# Patient Record
Sex: Male | Born: 1935 | Race: Black or African American | Hispanic: No | Marital: Married | Smoking: Never smoker
Health system: Southern US, Community
[De-identification: ages and names within clinical notes are randomized; demographics above are authoritative.]

## PROBLEM LIST (undated history)

## (undated) DIAGNOSIS — N186 End stage renal disease: Secondary | ICD-10-CM

## (undated) DIAGNOSIS — R569 Unspecified convulsions: Secondary | ICD-10-CM

## (undated) DIAGNOSIS — M1990 Unspecified osteoarthritis, unspecified site: Secondary | ICD-10-CM

## (undated) DIAGNOSIS — I10 Essential (primary) hypertension: Secondary | ICD-10-CM

## (undated) DIAGNOSIS — G4730 Sleep apnea, unspecified: Secondary | ICD-10-CM

## (undated) DIAGNOSIS — E119 Type 2 diabetes mellitus without complications: Secondary | ICD-10-CM

## (undated) DIAGNOSIS — Z992 Dependence on renal dialysis: Secondary | ICD-10-CM

## (undated) DIAGNOSIS — K922 Gastrointestinal hemorrhage, unspecified: Secondary | ICD-10-CM

## (undated) DIAGNOSIS — I272 Pulmonary hypertension, unspecified: Secondary | ICD-10-CM

## (undated) DIAGNOSIS — S065X9A Traumatic subdural hemorrhage with loss of consciousness of unspecified duration, initial encounter: Secondary | ICD-10-CM

## (undated) DIAGNOSIS — Z9289 Personal history of other medical treatment: Secondary | ICD-10-CM

## (undated) HISTORY — DX: Traumatic subdural hemorrhage with loss of consciousness of unspecified duration, initial encounter: S06.5X9A

---

## 1998-02-27 ENCOUNTER — Inpatient Hospital Stay (HOSPITAL_COMMUNITY): Admission: EM | Admit: 1998-02-27 | Discharge: 1998-04-02 | Payer: Self-pay | Admitting: Internal Medicine

## 1998-04-02 ENCOUNTER — Inpatient Hospital Stay
Admission: RE | Admit: 1998-04-02 | Discharge: 1998-05-07 | Payer: Self-pay | Admitting: Physical Medicine & Rehabilitation

## 1998-04-28 ENCOUNTER — Encounter: Payer: Self-pay | Admitting: Physical Medicine & Rehabilitation

## 1998-05-07 ENCOUNTER — Inpatient Hospital Stay (HOSPITAL_COMMUNITY)
Admission: RE | Admit: 1998-05-07 | Discharge: 1998-05-27 | Payer: Self-pay | Admitting: Physical Medicine & Rehabilitation

## 1998-05-29 ENCOUNTER — Encounter
Admission: RE | Admit: 1998-05-29 | Discharge: 1998-08-27 | Payer: Self-pay | Admitting: Physical Medicine and Rehabilitation

## 1998-06-18 ENCOUNTER — Encounter: Payer: Self-pay | Admitting: Neurosurgery

## 1998-06-18 ENCOUNTER — Ambulatory Visit (HOSPITAL_COMMUNITY): Admission: RE | Admit: 1998-06-18 | Discharge: 1998-06-18 | Payer: Self-pay | Admitting: Neurosurgery

## 1998-06-19 ENCOUNTER — Inpatient Hospital Stay (HOSPITAL_COMMUNITY): Admission: AD | Admit: 1998-06-19 | Discharge: 1998-06-26 | Payer: Self-pay | Admitting: Infectious Diseases

## 1998-06-19 ENCOUNTER — Encounter: Admission: RE | Admit: 1998-06-19 | Discharge: 1998-06-19 | Payer: Self-pay | Admitting: Internal Medicine

## 1998-07-28 ENCOUNTER — Encounter: Admission: RE | Admit: 1998-07-28 | Discharge: 1998-07-28 | Payer: Self-pay | Admitting: Internal Medicine

## 1998-08-12 ENCOUNTER — Encounter: Payer: Self-pay | Admitting: Internal Medicine

## 1998-08-12 ENCOUNTER — Ambulatory Visit (HOSPITAL_COMMUNITY): Admission: RE | Admit: 1998-08-12 | Discharge: 1998-08-12 | Payer: Self-pay | Admitting: Internal Medicine

## 1998-08-18 ENCOUNTER — Encounter: Admission: RE | Admit: 1998-08-18 | Discharge: 1998-08-18 | Payer: Self-pay | Admitting: Infectious Diseases

## 1998-09-22 ENCOUNTER — Encounter: Admission: RE | Admit: 1998-09-22 | Discharge: 1998-09-22 | Payer: Self-pay | Admitting: Internal Medicine

## 1998-10-08 ENCOUNTER — Ambulatory Visit (HOSPITAL_COMMUNITY): Admission: RE | Admit: 1998-10-08 | Discharge: 1998-10-08 | Payer: Self-pay | Admitting: Neurosurgery

## 1998-10-08 ENCOUNTER — Encounter: Payer: Self-pay | Admitting: Neurosurgery

## 1998-11-24 ENCOUNTER — Encounter: Admission: RE | Admit: 1998-11-24 | Discharge: 1998-11-24 | Payer: Self-pay | Admitting: Internal Medicine

## 1998-12-10 ENCOUNTER — Ambulatory Visit (HOSPITAL_COMMUNITY): Admission: RE | Admit: 1998-12-10 | Discharge: 1998-12-10 | Payer: Self-pay | Admitting: Neurosurgery

## 1998-12-22 ENCOUNTER — Encounter: Admission: RE | Admit: 1998-12-22 | Discharge: 1998-12-22 | Payer: Self-pay | Admitting: Internal Medicine

## 1999-01-19 ENCOUNTER — Encounter: Admission: RE | Admit: 1999-01-19 | Discharge: 1999-01-19 | Payer: Self-pay | Admitting: Internal Medicine

## 2000-01-11 ENCOUNTER — Encounter: Admission: RE | Admit: 2000-01-11 | Discharge: 2000-01-11 | Payer: Self-pay | Admitting: Family Medicine

## 2000-01-11 ENCOUNTER — Encounter: Payer: Self-pay | Admitting: Family Medicine

## 2000-01-28 ENCOUNTER — Ambulatory Visit (HOSPITAL_COMMUNITY): Admission: RE | Admit: 2000-01-28 | Discharge: 2000-01-28 | Payer: Self-pay | Admitting: *Deleted

## 2000-02-02 ENCOUNTER — Ambulatory Visit (HOSPITAL_COMMUNITY): Admission: RE | Admit: 2000-02-02 | Discharge: 2000-02-02 | Payer: Self-pay | Admitting: *Deleted

## 2000-02-02 ENCOUNTER — Encounter: Payer: Self-pay | Admitting: *Deleted

## 2000-02-12 ENCOUNTER — Ambulatory Visit (HOSPITAL_COMMUNITY): Admission: RE | Admit: 2000-02-12 | Discharge: 2000-02-12 | Payer: Self-pay | Admitting: Family Medicine

## 2000-04-06 ENCOUNTER — Observation Stay (HOSPITAL_COMMUNITY): Admission: RE | Admit: 2000-04-06 | Discharge: 2000-04-07 | Payer: Self-pay | Admitting: Nephrology

## 2000-04-06 ENCOUNTER — Encounter (INDEPENDENT_AMBULATORY_CARE_PROVIDER_SITE_OTHER): Payer: Self-pay | Admitting: *Deleted

## 2000-04-06 ENCOUNTER — Encounter: Payer: Self-pay | Admitting: Nephrology

## 2000-04-16 ENCOUNTER — Emergency Department (HOSPITAL_COMMUNITY): Admission: EM | Admit: 2000-04-16 | Discharge: 2000-04-16 | Payer: Self-pay | Admitting: Emergency Medicine

## 2000-04-17 ENCOUNTER — Inpatient Hospital Stay (HOSPITAL_COMMUNITY): Admission: EM | Admit: 2000-04-17 | Discharge: 2000-04-20 | Payer: Self-pay | Admitting: Emergency Medicine

## 2000-04-19 ENCOUNTER — Encounter: Payer: Self-pay | Admitting: Nephrology

## 2000-12-26 ENCOUNTER — Ambulatory Visit (HOSPITAL_COMMUNITY): Admission: RE | Admit: 2000-12-26 | Discharge: 2000-12-26 | Payer: Self-pay | Admitting: Otolaryngology

## 2000-12-26 ENCOUNTER — Encounter: Payer: Self-pay | Admitting: Otolaryngology

## 2001-11-06 ENCOUNTER — Inpatient Hospital Stay (HOSPITAL_COMMUNITY): Admission: AD | Admit: 2001-11-06 | Discharge: 2001-11-09 | Payer: Self-pay | Admitting: Family Medicine

## 2001-11-07 ENCOUNTER — Encounter: Payer: Self-pay | Admitting: Infectious Diseases

## 2001-11-07 ENCOUNTER — Encounter: Payer: Self-pay | Admitting: Family Medicine

## 2002-09-06 HISTORY — PX: SPINE SURGERY: SHX786

## 2004-09-28 ENCOUNTER — Encounter (HOSPITAL_COMMUNITY): Admission: RE | Admit: 2004-09-28 | Discharge: 2004-12-27 | Payer: Self-pay | Admitting: Nephrology

## 2005-01-04 ENCOUNTER — Encounter (HOSPITAL_COMMUNITY): Admission: RE | Admit: 2005-01-04 | Discharge: 2005-04-04 | Payer: Self-pay | Admitting: Nephrology

## 2005-04-12 ENCOUNTER — Encounter (HOSPITAL_COMMUNITY): Admission: RE | Admit: 2005-04-12 | Discharge: 2005-07-11 | Payer: Self-pay | Admitting: Nephrology

## 2005-07-19 ENCOUNTER — Encounter (HOSPITAL_COMMUNITY): Admission: RE | Admit: 2005-07-19 | Discharge: 2005-10-17 | Payer: Self-pay | Admitting: Nephrology

## 2005-10-25 ENCOUNTER — Encounter (HOSPITAL_COMMUNITY): Admission: RE | Admit: 2005-10-25 | Discharge: 2006-01-23 | Payer: Self-pay | Admitting: Nephrology

## 2006-01-05 ENCOUNTER — Encounter: Payer: Self-pay | Admitting: Neurosurgery

## 2006-01-05 ENCOUNTER — Encounter: Payer: Self-pay | Admitting: Infectious Diseases

## 2006-02-01 ENCOUNTER — Encounter (HOSPITAL_COMMUNITY): Admission: RE | Admit: 2006-02-01 | Discharge: 2006-05-02 | Payer: Self-pay | Admitting: Nephrology

## 2006-05-10 ENCOUNTER — Encounter (HOSPITAL_COMMUNITY): Admission: RE | Admit: 2006-05-10 | Discharge: 2006-08-08 | Payer: Self-pay | Admitting: Nephrology

## 2006-07-21 ENCOUNTER — Inpatient Hospital Stay (HOSPITAL_COMMUNITY): Admission: EM | Admit: 2006-07-21 | Discharge: 2006-07-24 | Payer: Self-pay | Admitting: Emergency Medicine

## 2006-08-03 ENCOUNTER — Ambulatory Visit: Payer: Self-pay | Admitting: Internal Medicine

## 2006-08-10 ENCOUNTER — Ambulatory Visit (HOSPITAL_COMMUNITY): Admission: RE | Admit: 2006-08-10 | Discharge: 2006-08-10 | Payer: Self-pay | Admitting: Gastroenterology

## 2007-03-03 ENCOUNTER — Encounter (HOSPITAL_COMMUNITY): Admission: RE | Admit: 2007-03-03 | Discharge: 2007-05-10 | Payer: Self-pay | Admitting: Family Medicine

## 2007-03-08 ENCOUNTER — Encounter: Admission: RE | Admit: 2007-03-08 | Discharge: 2007-03-08 | Payer: Self-pay | Admitting: Family Medicine

## 2007-06-20 ENCOUNTER — Encounter (HOSPITAL_COMMUNITY): Admission: RE | Admit: 2007-06-20 | Discharge: 2007-09-18 | Payer: Self-pay | Admitting: Nephrology

## 2007-08-17 ENCOUNTER — Inpatient Hospital Stay (HOSPITAL_COMMUNITY): Admission: AD | Admit: 2007-08-17 | Discharge: 2007-08-21 | Payer: Self-pay | Admitting: Nephrology

## 2007-08-18 ENCOUNTER — Encounter (INDEPENDENT_AMBULATORY_CARE_PROVIDER_SITE_OTHER): Payer: Self-pay | Admitting: Gastroenterology

## 2007-09-26 ENCOUNTER — Encounter (HOSPITAL_COMMUNITY): Admission: RE | Admit: 2007-09-26 | Discharge: 2007-12-25 | Payer: Self-pay | Admitting: Cardiology

## 2008-01-02 ENCOUNTER — Encounter (HOSPITAL_COMMUNITY): Admission: RE | Admit: 2008-01-02 | Discharge: 2008-04-01 | Payer: Self-pay | Admitting: Nephrology

## 2008-04-09 ENCOUNTER — Encounter (HOSPITAL_COMMUNITY): Admission: RE | Admit: 2008-04-09 | Discharge: 2008-07-08 | Payer: Self-pay | Admitting: Nephrology

## 2008-07-16 ENCOUNTER — Encounter (HOSPITAL_COMMUNITY): Admission: RE | Admit: 2008-07-16 | Discharge: 2008-09-05 | Payer: Self-pay | Admitting: Nephrology

## 2008-09-10 ENCOUNTER — Encounter (HOSPITAL_COMMUNITY): Admission: RE | Admit: 2008-09-10 | Discharge: 2008-12-09 | Payer: Self-pay | Admitting: Nephrology

## 2008-12-17 ENCOUNTER — Encounter (HOSPITAL_COMMUNITY): Admission: RE | Admit: 2008-12-17 | Discharge: 2009-03-17 | Payer: Self-pay | Admitting: Nephrology

## 2009-03-25 ENCOUNTER — Encounter (HOSPITAL_COMMUNITY): Admission: RE | Admit: 2009-03-25 | Discharge: 2009-06-23 | Payer: Self-pay | Admitting: Nephrology

## 2009-07-01 ENCOUNTER — Encounter (HOSPITAL_COMMUNITY): Admission: RE | Admit: 2009-07-01 | Discharge: 2009-09-29 | Payer: Self-pay | Admitting: Nephrology

## 2009-09-30 ENCOUNTER — Encounter (HOSPITAL_COMMUNITY): Admission: RE | Admit: 2009-09-30 | Discharge: 2009-12-29 | Payer: Self-pay | Admitting: Nephrology

## 2009-12-30 ENCOUNTER — Encounter (HOSPITAL_COMMUNITY): Admission: RE | Admit: 2009-12-30 | Discharge: 2010-03-30 | Payer: Self-pay | Admitting: Nephrology

## 2010-03-31 ENCOUNTER — Encounter (HOSPITAL_COMMUNITY): Admission: RE | Admit: 2010-03-31 | Discharge: 2010-06-29 | Payer: Self-pay | Admitting: Nephrology

## 2010-04-16 ENCOUNTER — Encounter: Admission: RE | Admit: 2010-04-16 | Discharge: 2010-04-16 | Payer: Self-pay | Admitting: Nephrology

## 2010-06-30 ENCOUNTER — Encounter (HOSPITAL_COMMUNITY)
Admission: RE | Admit: 2010-06-30 | Discharge: 2010-09-28 | Payer: Self-pay | Source: Home / Self Care | Attending: Nephrology | Admitting: Nephrology

## 2010-09-21 LAB — POCT HEMOGLOBIN-HEMACUE: Hemoglobin: 12.9 g/dL — ABNORMAL LOW (ref 13.0–17.0)

## 2010-09-27 ENCOUNTER — Encounter: Payer: Self-pay | Admitting: Family Medicine

## 2010-09-28 ENCOUNTER — Encounter (HOSPITAL_COMMUNITY)
Admission: RE | Admit: 2010-09-28 | Discharge: 2010-10-06 | Payer: Self-pay | Source: Home / Self Care | Attending: Nephrology | Admitting: Nephrology

## 2010-09-29 LAB — RENAL FUNCTION PANEL
Albumin: 4.3 g/dL (ref 3.5–5.2)
BUN: 98 mg/dL — ABNORMAL HIGH (ref 6–23)
CO2: 18 mEq/L — ABNORMAL LOW (ref 19–32)
Calcium: 8.1 mg/dL — ABNORMAL LOW (ref 8.4–10.5)
Chloride: 113 mEq/L — ABNORMAL HIGH (ref 96–112)
Creatinine, Ser: 5.38 mg/dL — ABNORMAL HIGH (ref 0.4–1.5)
GFR calc Af Amer: 13 mL/min — ABNORMAL LOW (ref >60)
GFR calc non Af Amer: 10 mL/min — ABNORMAL LOW (ref >60)
Glucose, Bld: 148 mg/dL — ABNORMAL HIGH (ref 70–99)
Phosphorus: 5.7 mg/dL — ABNORMAL HIGH (ref 2.3–4.6)
Potassium: 5.5 mEq/L — ABNORMAL HIGH (ref 3.5–5.1)
Sodium: 140 mEq/L (ref 135–145)

## 2010-09-29 LAB — CBC
HCT: 40.3 % (ref 39.0–52.0)
Hemoglobin: 12.7 g/dL — ABNORMAL LOW (ref 13.0–17.0)
MCH: 29.1 pg (ref 26.0–34.0)
MCHC: 31.5 g/dL (ref 30.0–36.0)
MCV: 92.4 fL (ref 78.0–100.0)
Platelets: 131 10*3/uL — ABNORMAL LOW (ref 150–400)
RBC: 4.36 MIL/uL (ref 4.22–5.81)
RDW: 16.2 % — ABNORMAL HIGH (ref 11.5–15.5)
WBC: 3.8 10*3/uL — ABNORMAL LOW (ref 4.0–10.5)

## 2010-10-12 ENCOUNTER — Encounter (HOSPITAL_COMMUNITY): Payer: Self-pay

## 2010-10-16 ENCOUNTER — Other Ambulatory Visit: Payer: Self-pay

## 2010-10-16 ENCOUNTER — Encounter (HOSPITAL_COMMUNITY)
Admission: RE | Admit: 2010-10-16 | Discharge: 2010-10-16 | Disposition: A | Discharge status: Home / Self Care | Payer: MEDICARE | Source: Ambulatory Visit | Attending: Nephrology | Admitting: Nephrology

## 2010-10-16 DIAGNOSIS — N189 Chronic kidney disease, unspecified: Secondary | ICD-10-CM | POA: Insufficient documentation

## 2010-10-16 DIAGNOSIS — I129 Hypertensive chronic kidney disease with stage 1 through stage 4 chronic kidney disease, or unspecified chronic kidney disease: Secondary | ICD-10-CM | POA: Insufficient documentation

## 2010-10-16 DIAGNOSIS — E119 Type 2 diabetes mellitus without complications: Secondary | ICD-10-CM | POA: Insufficient documentation

## 2010-10-16 DIAGNOSIS — D631 Anemia in chronic kidney disease: Secondary | ICD-10-CM | POA: Insufficient documentation

## 2010-10-16 LAB — POCT HEMOGLOBIN-HEMACUE: Hemoglobin: 11.3 g/dL — ABNORMAL LOW (ref 13.0–17.0)

## 2010-10-23 ENCOUNTER — Encounter (HOSPITAL_COMMUNITY): Payer: MEDICARE

## 2010-10-23 ENCOUNTER — Other Ambulatory Visit: Payer: Self-pay

## 2010-10-26 LAB — POCT HEMOGLOBIN-HEMACUE: Hemoglobin: 11 g/dL — ABNORMAL LOW (ref 13.0–17.0)

## 2010-10-30 ENCOUNTER — Other Ambulatory Visit: Payer: Self-pay | Admitting: Nephrology

## 2010-10-30 ENCOUNTER — Encounter (HOSPITAL_COMMUNITY): Payer: MEDICARE

## 2010-10-30 LAB — RENAL FUNCTION PANEL
Albumin: 4.2 g/dL (ref 3.5–5.2)
BUN: 84 mg/dL — ABNORMAL HIGH (ref 6–23)
CO2: 18 mEq/L — ABNORMAL LOW (ref 19–32)
Calcium: 8.4 mg/dL (ref 8.4–10.5)
Chloride: 111 mEq/L (ref 96–112)
Creatinine, Ser: 5.58 mg/dL — ABNORMAL HIGH (ref 0.4–1.5)
GFR calc Af Amer: 12 mL/min — ABNORMAL LOW (ref >60)
GFR calc non Af Amer: 10 mL/min — ABNORMAL LOW (ref >60)
Glucose, Bld: 124 mg/dL — ABNORMAL HIGH (ref 70–99)
Phosphorus: 5.4 mg/dL — ABNORMAL HIGH (ref 2.3–4.6)
Potassium: 5 mEq/L (ref 3.5–5.1)
Sodium: 139 mEq/L (ref 135–145)

## 2010-10-30 LAB — CBC
HCT: 37.9 % — ABNORMAL LOW (ref 39.0–52.0)
Hemoglobin: 12.1 g/dL — ABNORMAL LOW (ref 13.0–17.0)
MCH: 30.1 pg (ref 26.0–34.0)
MCHC: 31.9 g/dL (ref 30.0–36.0)
MCV: 94.3 fL (ref 78.0–100.0)
Platelets: 137 10*3/uL — ABNORMAL LOW (ref 150–400)
RBC: 4.02 MIL/uL — ABNORMAL LOW (ref 4.22–5.81)
RDW: 16.6 % — ABNORMAL HIGH (ref 11.5–15.5)
WBC: 3.2 10*3/uL — ABNORMAL LOW (ref 4.0–10.5)

## 2010-11-13 ENCOUNTER — Other Ambulatory Visit: Payer: Self-pay

## 2010-11-13 ENCOUNTER — Encounter (HOSPITAL_COMMUNITY): Payer: MEDICARE | Attending: Nephrology

## 2010-11-13 DIAGNOSIS — D631 Anemia in chronic kidney disease: Secondary | ICD-10-CM | POA: Insufficient documentation

## 2010-11-13 DIAGNOSIS — I129 Hypertensive chronic kidney disease with stage 1 through stage 4 chronic kidney disease, or unspecified chronic kidney disease: Secondary | ICD-10-CM | POA: Insufficient documentation

## 2010-11-13 DIAGNOSIS — E119 Type 2 diabetes mellitus without complications: Secondary | ICD-10-CM | POA: Insufficient documentation

## 2010-11-13 DIAGNOSIS — N189 Chronic kidney disease, unspecified: Secondary | ICD-10-CM | POA: Insufficient documentation

## 2010-11-16 LAB — RENAL FUNCTION PANEL
Albumin: 4.2 g/dL (ref 3.5–5.2)
BUN: 89 mg/dL — ABNORMAL HIGH (ref 6–23)
CO2: 19 meq/L (ref 19–32)
Calcium: 8.1 mg/dL — ABNORMAL LOW (ref 8.4–10.5)
Chloride: 111 meq/L (ref 96–112)
Creatinine, Ser: 5.53 mg/dL — ABNORMAL HIGH (ref 0.4–1.5)
GFR calc non Af Amer: 10 mL/min — ABNORMAL LOW
Glucose, Bld: 198 mg/dL — ABNORMAL HIGH (ref 70–99)
Phosphorus: 4.8 mg/dL — ABNORMAL HIGH (ref 2.3–4.6)
Potassium: 5.7 meq/L — ABNORMAL HIGH (ref 3.5–5.1)
Sodium: 136 meq/L (ref 135–145)

## 2010-11-16 LAB — POCT HEMOGLOBIN-HEMACUE
Hemoglobin: 12.7 g/dL — ABNORMAL LOW (ref 13.0–17.0)
Hemoglobin: 11.7 g/dL — ABNORMAL LOW (ref 13.0–17.0)
Hemoglobin: 11.8 g/dL — ABNORMAL LOW (ref 13.0–17.0)

## 2010-11-16 LAB — CBC
HCT: 34.4 % — ABNORMAL LOW (ref 39.0–52.0)
Hemoglobin: 11.2 g/dL — ABNORMAL LOW (ref 13.0–17.0)
MCH: 29.8 pg (ref 26.0–34.0)
MCHC: 32.6 g/dL (ref 30.0–36.0)
MCV: 91.5 fL (ref 78.0–100.0)
Platelets: 134 10*3/uL — ABNORMAL LOW (ref 150–400)
RBC: 3.76 MIL/uL — ABNORMAL LOW (ref 4.22–5.81)
RDW: 14.8 % (ref 11.5–15.5)
WBC: 3.3 10*3/uL — ABNORMAL LOW (ref 4.0–10.5)

## 2010-11-17 LAB — CBC
HCT: 37.9 % — ABNORMAL LOW (ref 39.0–52.0)
Hemoglobin: 12 g/dL — ABNORMAL LOW (ref 13.0–17.0)
MCH: 29.8 pg (ref 26.0–34.0)
MCHC: 31.7 g/dL (ref 30.0–36.0)
MCV: 94 fL (ref 78.0–100.0)
Platelets: 124 10*3/uL — ABNORMAL LOW (ref 150–400)
RBC: 4.03 MIL/uL — ABNORMAL LOW (ref 4.22–5.81)
RDW: 15.9 % — ABNORMAL HIGH (ref 11.5–15.5)
WBC: 3.2 10*3/uL — ABNORMAL LOW (ref 4.0–10.5)

## 2010-11-17 LAB — RENAL FUNCTION PANEL
Albumin: 4.5 g/dL (ref 3.5–5.2)
BUN: 99 mg/dL — ABNORMAL HIGH (ref 6–23)
CO2: 20 mEq/L (ref 19–32)
Calcium: 8.3 mg/dL — ABNORMAL LOW (ref 8.4–10.5)
Chloride: 113 mEq/L — ABNORMAL HIGH (ref 96–112)
Creatinine, Ser: 5.58 mg/dL — ABNORMAL HIGH (ref 0.4–1.5)
GFR calc Af Amer: 12 mL/min — ABNORMAL LOW (ref >60)
GFR calc non Af Amer: 10 mL/min — ABNORMAL LOW (ref >60)
Glucose, Bld: 215 mg/dL — ABNORMAL HIGH (ref 70–99)
Phosphorus: 5.7 mg/dL — ABNORMAL HIGH (ref 2.3–4.6)
Potassium: 5.9 mEq/L — ABNORMAL HIGH (ref 3.5–5.1)
Sodium: 138 mEq/L (ref 135–145)

## 2010-11-17 LAB — POCT HEMOGLOBIN-HEMACUE: Hemoglobin: 12.9 g/dL — ABNORMAL LOW (ref 13.0–17.0)

## 2010-11-18 LAB — RENAL FUNCTION PANEL
CO2: 18 mEq/L — ABNORMAL LOW (ref 19–32)
Chloride: 114 mEq/L — ABNORMAL HIGH (ref 96–112)
Creatinine, Ser: 6.17 mg/dL — ABNORMAL HIGH (ref 0.4–1.5)
GFR calc Af Amer: 11 mL/min — ABNORMAL LOW (ref >60)
GFR calc non Af Amer: 9 mL/min — ABNORMAL LOW (ref >60)
Potassium: 5.3 mEq/L — ABNORMAL HIGH (ref 3.5–5.1)
Sodium: 137 mEq/L (ref 135–145)

## 2010-11-18 LAB — POCT HEMOGLOBIN-HEMACUE
Hemoglobin: 10.8 g/dL — ABNORMAL LOW (ref 13.0–17.0)
Hemoglobin: 10.8 g/dL — ABNORMAL LOW (ref 13.0–17.0)

## 2010-11-19 LAB — CBC
Platelets: 103 10*3/uL — ABNORMAL LOW (ref 150–400)
Platelets: 152 10*3/uL (ref 150–400)
RBC: 3.52 MIL/uL — ABNORMAL LOW (ref 4.22–5.81)
RBC: 3.94 MIL/uL — ABNORMAL LOW (ref 4.22–5.81)
RDW: 14.8 % (ref 11.5–15.5)
WBC: 3.7 10*3/uL — ABNORMAL LOW (ref 4.0–10.5)
WBC: 3.4 10*3/uL — ABNORMAL LOW (ref 4.0–10.5)

## 2010-11-19 LAB — POCT HEMOGLOBIN-HEMACUE
Hemoglobin: 12 g/dL — ABNORMAL LOW (ref 13.0–17.0)
Hemoglobin: 12.3 g/dL — ABNORMAL LOW (ref 13.0–17.0)
Hemoglobin: 13.8 g/dL (ref 13.0–17.0)

## 2010-11-20 LAB — RENAL FUNCTION PANEL
Calcium: 8.5 mg/dL (ref 8.4–10.5)
Creatinine, Ser: 5.45 mg/dL — ABNORMAL HIGH (ref 0.4–1.5)
GFR calc Af Amer: 12 mL/min — ABNORMAL LOW (ref >60)
GFR calc non Af Amer: 10 mL/min — ABNORMAL LOW (ref >60)
Phosphorus: 5.2 mg/dL — ABNORMAL HIGH (ref 2.3–4.6)
Sodium: 138 mEq/L (ref 135–145)

## 2010-11-20 LAB — POCT HEMOGLOBIN-HEMACUE: Hemoglobin: 11.8 g/dL — ABNORMAL LOW (ref 13.0–17.0)

## 2010-11-21 LAB — CBC
HCT: 28.2 % — ABNORMAL LOW (ref 39.0–52.0)
HCT: 36.5 % — ABNORMAL LOW (ref 39.0–52.0)
Hemoglobin: 11.8 g/dL — ABNORMAL LOW (ref 13.0–17.0)
MCH: 33.6 pg (ref 26.0–34.0)
MCHC: 33.6 g/dL (ref 30.0–36.0)
MCHC: 32.4 g/dL (ref 30.0–36.0)
MCV: 84.4 fL (ref 78.0–100.0)
RBC: 4.32 MIL/uL (ref 4.22–5.81)
RDW: 13.5 % (ref 11.5–15.5)
RDW: 20.1 % — ABNORMAL HIGH (ref 11.5–15.5)

## 2010-11-21 LAB — RENAL FUNCTION PANEL
BUN: 80 mg/dL — ABNORMAL HIGH (ref 6–23)
CO2: 20 mEq/L (ref 19–32)
CO2: 21 mEq/L (ref 19–32)
Calcium: 8.6 mg/dL (ref 8.4–10.5)
Calcium: 7.7 mg/dL — ABNORMAL LOW (ref 8.4–10.5)
Creatinine, Ser: 5.19 mg/dL — ABNORMAL HIGH (ref 0.4–1.5)
Creatinine, Ser: 4.78 mg/dL — ABNORMAL HIGH (ref 0.4–1.5)
GFR calc Af Amer: 13 mL/min — ABNORMAL LOW (ref >60)
Glucose, Bld: 220 mg/dL — ABNORMAL HIGH (ref 70–99)
Glucose, Bld: 209 mg/dL — ABNORMAL HIGH (ref 70–99)
Phosphorus: 3.9 mg/dL (ref 2.3–4.6)
Phosphorus: 5.5 mg/dL — ABNORMAL HIGH (ref 2.3–4.6)
Sodium: 135 mEq/L (ref 135–145)
Sodium: 138 mEq/L (ref 135–145)

## 2010-11-21 LAB — HEMOGLOBIN: Hemoglobin: 9.7 g/dL — ABNORMAL LOW (ref 13.0–17.0)

## 2010-11-22 LAB — POCT HEMOGLOBIN-HEMACUE: Hemoglobin: 11.5 g/dL — ABNORMAL LOW (ref 13.0–17.0)

## 2010-11-23 LAB — RENAL FUNCTION PANEL
CO2: 24 mEq/L (ref 19–32)
Calcium: 8.9 mg/dL (ref 8.4–10.5)
Chloride: 107 mEq/L (ref 96–112)
Creatinine, Ser: 4.67 mg/dL — ABNORMAL HIGH (ref 0.4–1.5)
GFR calc Af Amer: 15 mL/min — ABNORMAL LOW (ref >60)
GFR calc non Af Amer: 12 mL/min — ABNORMAL LOW (ref >60)
Glucose, Bld: 142 mg/dL — ABNORMAL HIGH (ref 70–99)

## 2010-11-23 LAB — CBC
HCT: 39.2 % (ref 39.0–52.0)
Hemoglobin: 13.1 g/dL (ref 13.0–17.0)
MCHC: 33.5 g/dL (ref 30.0–36.0)
MCV: 100.6 fL — ABNORMAL HIGH (ref 78.0–100.0)
RBC: 3.9 MIL/uL — ABNORMAL LOW (ref 4.22–5.81)
WBC: 3.9 10*3/uL — ABNORMAL LOW (ref 4.0–10.5)

## 2010-11-24 LAB — RENAL FUNCTION PANEL
BUN: 66 mg/dL — ABNORMAL HIGH (ref 6–23)
CO2: 26 mEq/L (ref 19–32)
Chloride: 104 mEq/L (ref 96–112)
Creatinine, Ser: 5.09 mg/dL — ABNORMAL HIGH (ref 0.4–1.5)
GFR calc Af Amer: 13 mL/min — ABNORMAL LOW (ref >60)
GFR calc non Af Amer: 11 mL/min — ABNORMAL LOW (ref >60)

## 2010-11-24 LAB — CBC
HCT: 34.4 % — ABNORMAL LOW (ref 39.0–52.0)
MCV: 97.7 fL (ref 78.0–100.0)
RBC: 3.52 MIL/uL — ABNORMAL LOW (ref 4.22–5.81)
WBC: 3.5 10*3/uL — ABNORMAL LOW (ref 4.0–10.5)

## 2010-11-25 LAB — RENAL FUNCTION PANEL
Albumin: 4.3 g/dL (ref 3.5–5.2)
Albumin: 4.3 g/dL (ref 3.5–5.2)
CO2: 18 mEq/L — ABNORMAL LOW (ref 19–32)
CO2: 21 mEq/L (ref 19–32)
Calcium: 8.4 mg/dL (ref 8.4–10.5)
Chloride: 112 mEq/L (ref 96–112)
GFR calc Af Amer: 15 mL/min — ABNORMAL LOW (ref >60)
GFR calc Af Amer: 14 mL/min — ABNORMAL LOW (ref >60)
GFR calc non Af Amer: 12 mL/min — ABNORMAL LOW (ref >60)
GFR calc non Af Amer: 11 mL/min — ABNORMAL LOW (ref >60)
Potassium: 5.2 mEq/L — ABNORMAL HIGH (ref 3.5–5.1)
Sodium: 137 mEq/L (ref 135–145)
Sodium: 140 mEq/L (ref 135–145)

## 2010-11-25 LAB — CBC
HCT: 35.6 % — ABNORMAL LOW (ref 39.0–52.0)
MCV: 92.2 fL (ref 78.0–100.0)
Platelets: 168 10*3/uL (ref 150–400)
RBC: 4.15 MIL/uL — ABNORMAL LOW (ref 4.22–5.81)
WBC: 3.9 10*3/uL — ABNORMAL LOW (ref 4.0–10.5)
WBC: 4.6 10*3/uL (ref 4.0–10.5)

## 2010-11-25 LAB — POCT HEMOGLOBIN-HEMACUE
Hemoglobin: 11.7 g/dL — ABNORMAL LOW (ref 13.0–17.0)
Hemoglobin: 11.7 g/dL — ABNORMAL LOW (ref 13.0–17.0)
Hemoglobin: 12.7 g/dL — ABNORMAL LOW (ref 13.0–17.0)

## 2010-11-27 ENCOUNTER — Other Ambulatory Visit: Payer: Self-pay | Admitting: Nephrology

## 2010-11-27 ENCOUNTER — Encounter (HOSPITAL_COMMUNITY): Payer: MEDICARE

## 2010-11-27 LAB — CBC
HCT: 37.4 % — ABNORMAL LOW (ref 39.0–52.0)
HCT: 41 % (ref 39.0–52.0)
Hemoglobin: 12.1 g/dL — ABNORMAL LOW (ref 13.0–17.0)
Hemoglobin: 13.1 g/dL (ref 13.0–17.0)
Platelets: 204 10*3/uL (ref 150–400)
RBC: 3.99 MIL/uL — ABNORMAL LOW (ref 4.22–5.81)
RDW: 25.2 % — ABNORMAL HIGH (ref 11.5–15.5)
WBC: 2.9 10*3/uL — ABNORMAL LOW (ref 4.0–10.5)
WBC: 4 10*3/uL (ref 4.0–10.5)

## 2010-11-27 LAB — RENAL FUNCTION PANEL
Albumin: 4.2 g/dL (ref 3.5–5.2)
BUN: 69 mg/dL — ABNORMAL HIGH (ref 6–23)
CO2: 21 mEq/L (ref 19–32)
Chloride: 109 mEq/L (ref 96–112)
GFR calc Af Amer: 12 mL/min — ABNORMAL LOW (ref >60)
GFR calc non Af Amer: 10 mL/min — ABNORMAL LOW (ref >60)
Glucose, Bld: 136 mg/dL — ABNORMAL HIGH (ref 70–99)
Phosphorus: 4.2 mg/dL (ref 2.3–4.6)
Potassium: 5 mEq/L (ref 3.5–5.1)
Potassium: 4.7 mEq/L (ref 3.5–5.1)
Sodium: 138 mEq/L (ref 135–145)
Sodium: 135 mEq/L (ref 135–145)

## 2010-11-27 LAB — POCT HEMOGLOBIN-HEMACUE
Hemoglobin: 11.8 g/dL — ABNORMAL LOW (ref 13.0–17.0)
Hemoglobin: 13 g/dL (ref 13.0–17.0)
Hemoglobin: 14 g/dL (ref 13.0–17.0)

## 2010-12-07 LAB — POCT HEMOGLOBIN-HEMACUE: Hemoglobin: 11.3 g/dL — ABNORMAL LOW (ref 13.0–17.0)

## 2010-12-08 LAB — RENAL FUNCTION PANEL
Albumin: 4.5 g/dL (ref 3.5–5.2)
Calcium: 8.3 mg/dL — ABNORMAL LOW (ref 8.4–10.5)
GFR calc Af Amer: 13 mL/min — ABNORMAL LOW (ref >60)
GFR calc non Af Amer: 11 mL/min — ABNORMAL LOW (ref >60)
Phosphorus: 5 mg/dL — ABNORMAL HIGH (ref 2.3–4.6)
Potassium: 5.1 mEq/L (ref 3.5–5.1)
Sodium: 138 mEq/L (ref 135–145)

## 2010-12-08 LAB — CBC
Hemoglobin: 11.8 g/dL — ABNORMAL LOW (ref 13.0–17.0)
MCHC: 32.4 g/dL (ref 30.0–36.0)
Platelets: 188 10*3/uL (ref 150–400)
RDW: 19.3 % — ABNORMAL HIGH (ref 11.5–15.5)

## 2010-12-08 LAB — POCT HEMOGLOBIN-HEMACUE: Hemoglobin: 11.1 g/dL — ABNORMAL LOW (ref 13.0–17.0)

## 2010-12-09 LAB — RENAL FUNCTION PANEL
Albumin: 4.4 g/dL (ref 3.5–5.2)
GFR calc Af Amer: 16 mL/min — ABNORMAL LOW (ref >60)
Phosphorus: 4.4 mg/dL (ref 2.3–4.6)
Potassium: 5.3 mEq/L — ABNORMAL HIGH (ref 3.5–5.1)
Sodium: 135 mEq/L (ref 135–145)

## 2010-12-09 LAB — CBC
Platelets: 169 10*3/uL (ref 150–400)
RDW: 19.2 % — ABNORMAL HIGH (ref 11.5–15.5)

## 2010-12-09 LAB — POCT HEMOGLOBIN-HEMACUE: Hemoglobin: 11.6 g/dL — ABNORMAL LOW (ref 13.0–17.0)

## 2010-12-10 LAB — POCT HEMOGLOBIN-HEMACUE
Hemoglobin: 11.8 g/dL — ABNORMAL LOW (ref 13.0–17.0)
Hemoglobin: 10.9 g/dL — ABNORMAL LOW (ref 13.0–17.0)

## 2010-12-11 ENCOUNTER — Other Ambulatory Visit: Payer: Self-pay | Admitting: Nephrology

## 2010-12-11 ENCOUNTER — Encounter (HOSPITAL_COMMUNITY): Payer: MEDICARE | Attending: Nephrology

## 2010-12-11 DIAGNOSIS — I129 Hypertensive chronic kidney disease with stage 1 through stage 4 chronic kidney disease, or unspecified chronic kidney disease: Secondary | ICD-10-CM | POA: Insufficient documentation

## 2010-12-11 DIAGNOSIS — E119 Type 2 diabetes mellitus without complications: Secondary | ICD-10-CM | POA: Insufficient documentation

## 2010-12-11 DIAGNOSIS — D631 Anemia in chronic kidney disease: Secondary | ICD-10-CM | POA: Insufficient documentation

## 2010-12-11 DIAGNOSIS — N189 Chronic kidney disease, unspecified: Secondary | ICD-10-CM | POA: Insufficient documentation

## 2010-12-11 LAB — RENAL FUNCTION PANEL
Albumin: 4.5 g/dL (ref 3.5–5.2)
BUN: 75 mg/dL — ABNORMAL HIGH (ref 6–23)
Calcium: 8.7 mg/dL (ref 8.4–10.5)
Phosphorus: 4.6 mg/dL (ref 2.3–4.6)
Potassium: 5.8 mEq/L — ABNORMAL HIGH (ref 3.5–5.1)
Sodium: 142 mEq/L (ref 135–145)

## 2010-12-11 LAB — POCT HEMOGLOBIN-HEMACUE: Hemoglobin: 10.9 g/dL — ABNORMAL LOW (ref 13.0–17.0)

## 2010-12-11 LAB — CBC
Platelets: 180 10*3/uL (ref 150–400)
RDW: 19.4 % — ABNORMAL HIGH (ref 11.5–15.5)

## 2010-12-12 LAB — RENAL FUNCTION PANEL
Albumin: 4.3 g/dL (ref 3.5–5.2)
BUN: 85 mg/dL — ABNORMAL HIGH (ref 6–23)
Calcium: 8.6 mg/dL (ref 8.4–10.5)
Phosphorus: 5.2 mg/dL — ABNORMAL HIGH (ref 2.3–4.6)
Potassium: 5.6 mEq/L — ABNORMAL HIGH (ref 3.5–5.1)

## 2010-12-12 LAB — CBC
HCT: 33.1 % — ABNORMAL LOW (ref 39.0–52.0)
MCHC: 32.3 g/dL (ref 30.0–36.0)
Platelets: 192 10*3/uL (ref 150–400)
RDW: 18.9 % — ABNORMAL HIGH (ref 11.5–15.5)

## 2010-12-12 LAB — POCT HEMOGLOBIN-HEMACUE: Hemoglobin: 12 g/dL — ABNORMAL LOW (ref 13.0–17.0)

## 2010-12-13 LAB — RENAL FUNCTION PANEL
Albumin: 4 g/dL (ref 3.5–5.2)
BUN: 69 mg/dL — ABNORMAL HIGH (ref 6–23)
Calcium: 8.9 mg/dL (ref 8.4–10.5)
Creatinine, Ser: 3.95 mg/dL — ABNORMAL HIGH (ref 0.4–1.5)
GFR calc Af Amer: 18 mL/min — ABNORMAL LOW (ref >60)
GFR calc non Af Amer: 15 mL/min — ABNORMAL LOW (ref >60)
Phosphorus: 4.5 mg/dL (ref 2.3–4.6)

## 2010-12-13 LAB — CBC
HCT: 29.8 % — ABNORMAL LOW (ref 39.0–52.0)
MCHC: 32.3 g/dL (ref 30.0–36.0)
MCV: 86.2 fL (ref 78.0–100.0)
Platelets: 222 10*3/uL (ref 150–400)
RDW: 20.3 % — ABNORMAL HIGH (ref 11.5–15.5)
WBC: 4.4 10*3/uL (ref 4.0–10.5)

## 2010-12-14 LAB — CBC
MCHC: 32.3 g/dL (ref 30.0–36.0)
MCV: 84.2 fL (ref 78.0–100.0)
RDW: 19.7 % — ABNORMAL HIGH (ref 11.5–15.5)

## 2010-12-14 LAB — RENAL FUNCTION PANEL
CO2: 20 mEq/L (ref 19–32)
Calcium: 8.7 mg/dL (ref 8.4–10.5)
Creatinine, Ser: 4.96 mg/dL — ABNORMAL HIGH (ref 0.4–1.5)
GFR calc Af Amer: 14 mL/min — ABNORMAL LOW (ref >60)
GFR calc non Af Amer: 11 mL/min — ABNORMAL LOW (ref >60)
Phosphorus: 5.7 mg/dL — ABNORMAL HIGH (ref 2.3–4.6)
Sodium: 139 mEq/L (ref 135–145)

## 2010-12-14 LAB — POCT HEMOGLOBIN-HEMACUE: Hemoglobin: 10.7 g/dL — ABNORMAL LOW (ref 13.0–17.0)

## 2010-12-15 LAB — CBC
Hemoglobin: 9.1 g/dL — ABNORMAL LOW (ref 13.0–17.0)
MCHC: 32.2 g/dL (ref 30.0–36.0)
MCV: 83.3 fL (ref 78.0–100.0)
RBC: 3.42 MIL/uL — ABNORMAL LOW (ref 4.22–5.81)
RDW: 20.9 % — ABNORMAL HIGH (ref 11.5–15.5)

## 2010-12-15 LAB — RENAL FUNCTION PANEL
BUN: 76 mg/dL — ABNORMAL HIGH (ref 6–23)
CO2: 20 mEq/L (ref 19–32)
CO2: 20 mEq/L (ref 19–32)
Calcium: 8.1 mg/dL — ABNORMAL LOW (ref 8.4–10.5)
Calcium: 8.7 mg/dL (ref 8.4–10.5)
Chloride: 111 mEq/L (ref 96–112)
Creatinine, Ser: 4.32 mg/dL — ABNORMAL HIGH (ref 0.4–1.5)
Creatinine, Ser: 4.5 mg/dL — ABNORMAL HIGH (ref 0.4–1.5)
GFR calc Af Amer: 16 mL/min — ABNORMAL LOW (ref >60)
Glucose, Bld: 166 mg/dL — ABNORMAL HIGH (ref 70–99)
Phosphorus: 5.4 mg/dL — ABNORMAL HIGH (ref 2.3–4.6)
Sodium: 140 mEq/L (ref 135–145)

## 2010-12-16 LAB — POCT HEMOGLOBIN-HEMACUE: Hemoglobin: 8.8 g/dL — ABNORMAL LOW (ref 13.0–17.0)

## 2010-12-16 LAB — RENAL FUNCTION PANEL
CO2: 18 mEq/L — ABNORMAL LOW (ref 19–32)
Chloride: 110 mEq/L (ref 96–112)
GFR calc Af Amer: 17 mL/min — ABNORMAL LOW (ref >60)
GFR calc non Af Amer: 14 mL/min — ABNORMAL LOW (ref >60)
Glucose, Bld: 164 mg/dL — ABNORMAL HIGH (ref 70–99)
Potassium: 5.1 mEq/L (ref 3.5–5.1)
Sodium: 136 mEq/L (ref 135–145)

## 2010-12-17 LAB — RENAL FUNCTION PANEL
Albumin: 4.3 g/dL (ref 3.5–5.2)
BUN: 76 mg/dL — ABNORMAL HIGH (ref 6–23)
Creatinine, Ser: 4.07 mg/dL — ABNORMAL HIGH (ref 0.4–1.5)
Phosphorus: 5.1 mg/dL — ABNORMAL HIGH (ref 2.3–4.6)

## 2010-12-17 LAB — POCT HEMOGLOBIN-HEMACUE: Hemoglobin: 9 g/dL — ABNORMAL LOW (ref 13.0–17.0)

## 2010-12-21 ENCOUNTER — Encounter (HOSPITAL_COMMUNITY): Payer: MEDICARE

## 2010-12-21 ENCOUNTER — Other Ambulatory Visit: Payer: Self-pay | Admitting: Nephrology

## 2010-12-21 LAB — RENAL FUNCTION PANEL
Albumin: 4.1 g/dL (ref 3.5–5.2)
CO2: 21 mEq/L (ref 19–32)
Calcium: 8.7 mg/dL (ref 8.4–10.5)
Creatinine, Ser: 4.26 mg/dL — ABNORMAL HIGH (ref 0.4–1.5)
GFR calc Af Amer: 17 mL/min — ABNORMAL LOW (ref >60)
Glucose, Bld: 197 mg/dL — ABNORMAL HIGH (ref 70–99)
Potassium: 5 mEq/L (ref 3.5–5.1)

## 2010-12-21 LAB — POCT HEMOGLOBIN-HEMACUE: Hemoglobin: 11 g/dL — ABNORMAL LOW (ref 13.0–17.0)

## 2010-12-22 LAB — RENAL FUNCTION PANEL
Albumin: 4 g/dL (ref 3.5–5.2)
BUN: 65 mg/dL — ABNORMAL HIGH (ref 6–23)
Chloride: 107 mEq/L (ref 96–112)
Glucose, Bld: 166 mg/dL — ABNORMAL HIGH (ref 70–99)
Potassium: 5.1 mEq/L (ref 3.5–5.1)

## 2010-12-22 LAB — POCT HEMOGLOBIN-HEMACUE: Hemoglobin: 8.7 g/dL — ABNORMAL LOW (ref 13.0–17.0)

## 2011-01-01 ENCOUNTER — Other Ambulatory Visit: Payer: Self-pay | Admitting: Nephrology

## 2011-01-01 ENCOUNTER — Encounter (HOSPITAL_COMMUNITY): Payer: MEDICARE

## 2011-01-01 LAB — RENAL FUNCTION PANEL
Albumin: 4.5 g/dL (ref 3.5–5.2)
BUN: 71 mg/dL — ABNORMAL HIGH (ref 6–23)
Chloride: 112 mEq/L (ref 96–112)
GFR calc non Af Amer: 11 mL/min — ABNORMAL LOW (ref >60)
Potassium: 6.2 mEq/L — ABNORMAL HIGH (ref 3.5–5.1)
Sodium: 137 mEq/L (ref 135–145)

## 2011-01-01 LAB — CBC
MCV: 100 fL (ref 78.0–100.0)
Platelets: 136 10*3/uL — ABNORMAL LOW (ref 150–400)
RBC: 3.86 MIL/uL — ABNORMAL LOW (ref 4.22–5.81)
WBC: 2.9 10*3/uL — ABNORMAL LOW (ref 4.0–10.5)

## 2011-01-15 ENCOUNTER — Encounter (HOSPITAL_COMMUNITY): Payer: Medicare Other | Attending: Nephrology

## 2011-01-15 ENCOUNTER — Other Ambulatory Visit: Payer: Self-pay | Admitting: Nephrology

## 2011-01-15 DIAGNOSIS — E119 Type 2 diabetes mellitus without complications: Secondary | ICD-10-CM | POA: Insufficient documentation

## 2011-01-15 DIAGNOSIS — N039 Chronic nephritic syndrome with unspecified morphologic changes: Secondary | ICD-10-CM | POA: Insufficient documentation

## 2011-01-15 DIAGNOSIS — I129 Hypertensive chronic kidney disease with stage 1 through stage 4 chronic kidney disease, or unspecified chronic kidney disease: Secondary | ICD-10-CM | POA: Insufficient documentation

## 2011-01-15 DIAGNOSIS — D631 Anemia in chronic kidney disease: Secondary | ICD-10-CM | POA: Insufficient documentation

## 2011-01-15 DIAGNOSIS — N189 Chronic kidney disease, unspecified: Secondary | ICD-10-CM | POA: Insufficient documentation

## 2011-01-15 LAB — POCT HEMOGLOBIN-HEMACUE: Hemoglobin: 12.9 g/dL — ABNORMAL LOW (ref 13.0–17.0)

## 2011-01-19 NOTE — Discharge Summary (Signed)
[name], [name]                ACCOUNT NO.:  [id number]   MEDICAL RECORD NO.:  [id number]          PATIENT TYPE:  INP   LOCATION:  5702                         FACILITY:  [facility]   PHYSICIAN:  Jarome Matin, M.D.DATE OF BIRTH:  [birth year]   DATE OF ADMISSION:  08/17/2007  DATE OF DISCHARGE:  08/21/2007                               DISCHARGE SUMMARY   ADMITTING DIAGNOSIS:  Severe anemia, possibly gastrointestinal bleed.   DISCHARGE DIAGNOSES:  1. Anemia over gastrointestinal bleed.  2. Benign prostatic hypertrophy __________.  3. Hypertensive renal disease.  4. Iron-deficiency anemia.  5. Chronic obstructive pulmonary disease.  6. Possible sleep apnea.  7. Diaphragmatic hernia.  8. Internal and external hemorrhoids.  9. Diverticulosis of the colon with some hemorrhage.  10.Esophagitis.   BRIEF HISTORY AND PHYSICAL/HOSPITAL COURSE:  [name] is a 77 year old  gentleman, African American, who came to the office.  He had gotten a  CBC which showed a hemoglobin of 3.3 and a hematocrit of 12.2.  The  patient has a history of hypertension and a renal biopsy several years  ago that showed evidence of diabetic nephrosclerosis.  He had a gradual  increase of his creatinine and had an increase of prostatic hypertrophy.  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  He used to run  distances and play football.  He is now retired and lives with his wife.  He was in the hospital a year ago by Dr. Renaye Rakers.  He had a  hemoglobin of around 4.  Dr. Elnoria Howard the GI doctor and Dr. Loreta Ave did a  colonoscopy on the patient about a year ago.  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   PHYSICAL EXAMINATION:  VITAL SIGNS:  Temp 98.6, pulse 84, respirations  18, blood pressure 138/73, 97% saturation.  HEENT:  Pale sclerae.  NECK:  Veins are flat.  CHEST:  Clear to auscultation percussion.  Regular sinus rhythm.  He  also had some COPD and some evidence of sleep apnea.  He  uses CPAP at  night.  ABDOMEN:  Soft, no mass, no organomegaly.  NEUROLOGIC:  The patient could move all his extremities.  Reflexes were  bilaterally equal, legs were a bit weak, but weak bilaterally.   The patient was admitted,  put to bed for rest.  GI, Dr. Elnoria Howard, saw him  and he planned to complete his examination of his colon.  We initially  gave him 2 units of packed cells.  He had some diverticular disease in  the descending colon and he has internal and external rectal  hemorrhoids.  He seems to have persistent gastritis.  This all  leads to  a persistent iron-deficiency anemia.  In addition to anemia of chronic  renal disease.   VITAL SIGNS:  Temp 98.8, pulse 81, respirations 20, blood pressure  170/88.  It was up on admission.  And, his O2 was 99% on room air. We  went ahead and started him on some of his blood pressure medicines as we  had not restarted them, and gave him initially, as  stated before, 2  packs red blood cells.  GI recommended transfusing him up to at least a  hemoglobin of 8, and discharge him __________. We started him on some  iron supplements.  The patient felt much better.  I assume that the  patient had not passed out because his conditioning from running.   His EKG showed a normal sinus rhythm with nonspecific T wave changes,  rate of 89.  His other labs BUN 79, creatinine 4.7 and that went down to  about 47 with a creatinine of 3.2.  We transfused the patient some more  and got his hemoglobin up to around, almost up to 8 when the patient was  discharged and his hemoglobin up to about 7.3.  The patient is feeling  better and is anxious to get home with a BUN of 47 and creatinine 3.28.   So, we decided we would go ahead and send home and follow him as an  outpatient.   He was discharged on:  1. Protonix 40 mg daily.  2. Nu-Iron 150 mg b.i.d.  3. Aranesp shots 200 mcg every other week.  The patient will get his      Aranesp every two weeks.  4.  __________ mg daily for his blood pressure.  5. Ambien 5 mg h.s. for sleep.  6. Claritin 10 mg a day.   We will see him in the office in about a month.           ______________________________  Jarome Matin, M.D.     CEF/MEDQ  D:  10/05/2007  T:  10/05/2007  Job:  [id number]

## 2011-01-19 NOTE — Consult Note (Signed)
[name], [name]                ACCOUNT NO.:  [id number]   MEDICAL RECORD NO.:  [id number]          PATIENT TYPE:  INP   LOCATION:  5702                         FACILITY:  [facility]   PHYSICIAN:  Jordan Hawks. Elnoria Howard, MD    DATE OF BIRTH:  [birth year]   DATE OF CONSULTATION:  DATE OF DISCHARGE:                                 CONSULTATION   REASON FOR CONSULTATION:  Hemoglobin of 3.1.   PRIMARY CARE Blayne Frankie:  Dr. Renaye Rakers   NEPHROLOGIST:  Dr. 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  The patient stated he was  feeling weak and upon routine examination his hemoglobin was noted to be  in the 3 range.  Subsequently, the patient was admitted to the hospital  for further evaluation and treatment.  The patient denies having any  chest pain or shortness of breath, although he does state having some  weakness for approximately a month.  The patient denies any hematochezia  or melena.  No nausea, vomiting, abdominal pain, fevers, chills.  The  prior EGD and colonoscopy as well as a capsule endoscopy was only  revealing for a small nonbleeding AVM in the descending colon, otherwise  no other onset of bleeding could be defined during the examination last  year.   PAST MEDICAL AND SURGICAL HISTORY:  As stated above.   FAMILY HISTORY:  Noncontributory.   SOCIAL HISTORY:  The patient is retired, married, negative for alcohol,  tobacco, illicit drug use.   ALLERGIES:  CLONIDINE.   MEDICATIONS:  1. Micardis 80 mg one p.o. daily.  2. Januvia 50 mg one p.o. daily.  3. Colchicine 0.6 mg one p.o. daily.  4. Allopurinol 100 mg one p.o. daily.  5. Lyrica 75 mg one p.o. daily.  6. Sular 8.5 mg one p.o. daily.   REVIEW OF SYSTEMS:  As stated above in history  present illness,  otherwise, negative.   PHYSICAL EXAMINATION:  VITAL SIGNS:  Blood pressure is 138/72, heart  rate is 84, temperature is 98.6.  GENERAL:  The patient is in no acute distress, alert and oriented.  HEENT:  Normocephalic, atraumatic.  Extraocular muscles intact.  NECK:  Supple.  No lymphadenopathy.  LUNGS:  Clear to auscultation bilaterally.  CARDIOVASCULAR:  Regular rate and rhythm.  ABDOMEN:  Flat, soft, nontender, nondistended.  EXTREMITIES:  No clubbing, cyanosis or edema.  RECTAL:  Exam revealed brown dark stool which is heme-positive.   LABORATORY VALUES:  White blood cell count 4.2, hemoglobin 3.1, MCV  69.1, platelets at 116.  Sodium is 140, potassium 5.7, chloride 115, CO2  17, creatinine is 174, BUN 79, creatinine is 4.7, total bilirubin 0.3,  alk phos 42, AST 17, ALT 9, albumin is 4.0.   IMPRESSION:  1. Severe iron deficiency anemia.  2. Heme-positive stool.  3. Hypertension.  4. Chronic renal insufficiency.   After  evaluation, the patient is clear that he does have a GI source of  bleeding.  Repeat evaluation with the EGD and colonoscopy will be  required at this time.  Pending the results, a capsule endoscopy will  also be performed.  There is the possibility of performing a bleeding  scan, although, this may not yield the source of his site of bleeding.  He does have a known history of one AVM.  It is likely that other AVMs  do exist, and this is the most likely cause, but a repeat endoscopic  examination will be performed.   PLAN:  Plan is to perform an EGD and colonoscopy tomorrow.  Further  recommendations pending the examination.      Jordan Hawks Elnoria Howard, MD  Electronically Signed     PDH/MEDQ  D:  08/17/2007  T:  08/18/2007  Job:  [id number]   cc:   Renaye Rakers, M.D.  Jarome Matin, M.D.

## 2011-01-22 NOTE — H&P (Signed)
[name], [name]                ACCOUNT NO.:  [id number]   MEDICAL RECORD NO.:  [id number]          PATIENT TYPE:  INP   LOCATION:  3304                         FACILITY:  [facility]   PHYSICIAN:  Hillery Aldo, M.D.   DATE OF BIRTH:  27-Feb-1932   DATE OF ADMISSION:  07/21/2006  DATE OF DISCHARGE:                                HISTORY & PHYSICAL   PRIMARY CARE PHYSICIAN:  Renaye Rakers, M.D.   CHIEF COMPLAINTS:  Dark red diarrhea stools, dizziness.   HISTORY OF PRESENT ILLNESS:  The patient is a 77 year old male with a past  medical history of chronic anemia, chronic renal insufficiency, and  hypertension who was evaluated by his primary care physician on 07/20/2006  for complaints of lightheadedness and weakness.  Routine blood work done  during that visit revealed a significant anemia with a hemoglobin of 3.4, an  MCV of 74, and an MCHC of 27.2.  The patient was subsequently called by his  primary care physician and advised to come to the emergency department for  further evaluation.  The patient states that he has been lightheaded for  approximately 2 weeks time and that he has had noticeably bloody stools for  3 days.  Despite his significant anemia, he has continued to exercise on an  exercise bike for 30-40 minutes a day which he tolerates it well.  He states  that his problems with dizziness occur with positional changes.   PAST MEDICAL HISTORY:  1. Hypertension.  2. Benign prostatic hypertrophy.  3. Chronic anemia with a baseline hemoglobin of approximately 10.  4. History of colonoscopy in May2001 which was essentially normal.  5. History of diverticulosis by barium enema also in May2001.  6. 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  7. History of cellulitis of the right lower extremity which required skin      grafting in 1998.  8. MRSA diskitis status post lumbar fusion.  9. Glomerulosclerosis status post kidney biopsy in 2001.   FAMILY HISTORY:  The  patient's father died in his 47s of lung cancer.  Mother died in her early 29s from complications of Alzheimer's disease.  He  has five siblings.  One sister has diabetes and is obese.  One sister died  of complications of diabetes.  He has a brother who died in the [place] War.  He was discharged on medical leave and it was thought that his death was due  to ulcers.  Another brother died at 29 from stroke.  Another died at an  early age secondary to a congenital heart problem.   SOCIAL HISTORY:  The patient is married.  He is retired and has a Ph.D. in  physiology.  He has never smoked.  He occasionally drinks alcohol.   ALLERGIES:  CLONIDINE causes hypotension and arrhythmias.   CURRENT MEDICATIONS:  The patient is unsure which medicines he takes or the  dosages.  His wife states he is on Benicar and Norvasc.  He does take a baby  aspirin daily.  There is a question as to whether he gets Aranesp  injections  regularly.   REVIEW OF SYSTEMS:  The patient denies any fever.  He has longstanding  chills and cold intolerance.  No changes in his appetite.  No weight  changes.  No chest pain.  He does get dyspneic with exertion, but again he  is still able to exercise on his exercise bike for 30-40 minutes at a time.  The patient states that he has been using aspirin products, but no excessive  use.  All other systems negative.   PHYSICAL EXAMINATION:  VITAL SIGNS:  Temperature 97.4, pulse 85,  respirations 20, blood pressure 132/64, O2 saturation 100% on room air.  GENERAL:  Pale, elderly, black male in no acute distress.  HEENT: Normocephalic, atraumatic.  PERRL.  EOMI.  Oropharynx reveals dry  mucous membranes and pale oral mucosa.  NECK:  Supple, no thyromegaly, no lymphadenopathy, no jugular venous  distention.  CHEST:  Lungs clear to auscultation bilaterally with good air movement.  The  patient does have a large prominent scar his right upper chest wall.  HEART:  Regular rate and  rhythm.  No murmurs, rubs, or gallops.  ABDOMEN:  Soft, nontender, nondistended with normoactive bowel sounds.  RECTAL:  The patient has normal sphincter tone.  He has a small amount of  brown stool in the vault.  It is heme positive.  EXTREMITIES:  No clubbing, edema, cyanosis.  SKIN:  Warm and dry.  No rashes.  NEUROLOGIC:  Patient is alert and oriented x3.  Cranial nerves II-XII  grossly intact.  Nonfocal.   DATA REVIEW:  Chest x-ray shows nodular density in the left perihilar region  which is likely vascular.  There is no acute cardiopulmonary disease  otherwise.   LABORATORY DATA:  Sodium is 141, potassium 5, chloride 116, bicarb 17, BUN  51, creatinine 3.2, glucose 223.  LFTs are within normal limits.  White  blood cell count of 5.5, hemoglobin 3.9, hematocrit 12.8, platelet count 238  with an absolute neutrophil count of 4 and an MCV of 72.3.  PT is 15.8, PTT  30.  Urinalysis is negative for nitrites and leukocyte esterase.   ASSESSMENT/PLAN:  1. Acute on chronic anemia with likely superimposed gastrointestinal      bleed, possibly from a diverticular source:  Given the patient's      profound anemia, we will admit him to the step-down unit and obtain two      large bore intravenous site access points.  We will administer 4 units      of packed red blood cells and obtain serial measures of his hemoglobin.      I have called Dr. Parke Simmers to obtain old records, but she states that she      has not seen the patient before 2004 up until yesterday's visit.  I      have consulted Dr. Elnoria Howard of gastroenterology for further evaluation of      his GI tract.  I will start him on IV Protonix as well.  Iron studies      have already been ordered.  Additionally, B12 and folate studies have      also been done and are pending.  2. Acute renal failure in the setting of chronic renal insufficiency:  It     is unclear what the patient's baseline creatinine is.  Nevertheless, he      does have an  elevated BUN to creatinine ratio suggestive of volume      contraction.  We will hydrate  him when he is not receiving blood      products and watch his volume status closely.  Will monitor his serum      creatinine serially to ensure resolution.  3. Hypertension:  The patient has a known history of hypertension.  At      this time, we will hold his antihypertensive medications until we are      sure that he is hemodynamically stable.  4. Prophylaxis:  Will start GI prophylaxis with Protonix and use PAS hose      for DVT prophylaxis.      Hillery Aldo, M.D.  Electronically Signed     CR/MEDQ  D:  07/22/2006  T:  07/22/2006  Job:  [id number]   cc:   Renaye Rakers, M.D.

## 2011-01-22 NOTE — Discharge Summary (Signed)
[name], [name]                ACCOUNT NO.:  [id number]   MEDICAL RECORD NO.:  [id number]          PATIENT TYPE:  INP   LOCATION:  5018                         FACILITY:  [facility]   PHYSICIAN:  Isidor Holts, M.D.  DATE OF BIRTH:  10/27/1931   DATE OF ADMISSION:  07/21/2006  DATE OF DISCHARGE:  07/24/2006                                 DISCHARGE SUMMARY   PRIMARY CARE PHYSICIAN:  Renaye Rakers, M.D.   DISCHARGE DIAGNOSES:  1. Severe microcytic anemia, secondary to gastrointestinal blood loss,      i.e. acute on chronic. Required transfusion 6 units packed red blood      cells.  2. LA Grade A esophagitis, duodenal erosions, confirmed by upper      gastrointestinal endoscopy, July 22, 2006.  3. Colonic diverticulosis, arteriovenous malformations, internal      hemorrhoids, confirmed by colonoscopy July 22, 2006.  4. Hypertension.  5. History of benign prostatic hypertrophy.  6. Glomerulosclerosis/chronic renal insufficiency.   DISCHARGE MEDICATIONS:  1. Norvasc 10 mg p.o. daily.  2. Toprol XL 25 mg p.o. daily.  3. Nu-Iron 150 mg p.o. daily.  4. Protonix 40 mg p.o. b.i.d. for one month and then 40 mg p.o. daily.   Note:  Benicar has been discontinued secondary to renal dysfunction.   PROCEDURE:  1. Portable chest x-ray dated July 21, 2006.  This showed nodular      density, left perihilar region, likely vascular.  No acute      cardiopulmonary disease.  2. Upper gastrointestinal endoscopy dated July 22, 2006 by Dr. Jeani Hawking.  This showed LA grade A esophagitis, normal stomach, duodenal      erosions.  3. Colonoscopy dated July 22, 2006 by Dr. Jeani Hawking.  This showed      ascending colonic diverticula, normal transverse colon, normal cecum,      descending colonic AVM, sigmoid diverticula and internal/external      hemorrhoids of the rectum.   CONSULTATIONS:  Jordan Hawks. Elnoria Howard, MD, gastroenterologist.   ADMISSION HISTORY:  As H&P notes of  July 21, 2006, dictated by Dr.  Hillery Aldo. In brief, however, this is a 77 year old male, with known  history of hypertension, BPH, chronic anemia with baseline hemoglobin of  approximately 10, history of diverticulosis, prior MRSA abscess on anterior  chest, MRSA diskitis, prior history of cellulitis of left lower extremity  1998, status post skin grafting at that time, also  glomerulosclerosis/chronic renal insufficiency, who presented with light  headedness and weakness to his primary M.D, who then did blood work and  discovered a hemoglobin of 3.4 with MCV of 74.  The patient was referred to  the emergency department and was subsequently admitted for further  evaluation, investigation and management.   HOSPITAL COURSE:  PROBLEM #1:  ANEMIA, ACUTE ON CHRONIC GASTROINTESTINAL  BLOOD LOSS:  For details of presentation, see admission history above.  On  detailed questioning, it appears that the patient has also had light  headedness for approximately two weeks and in the three days prior to this  presentation, had noticed  bloody stools.  On initial evaluation in the  emergency department, he was found to have a hemoglobin of 3.9 with  hematocrit of 12.8.  MCV was 72.3.  These features were consistent with  microcytic anemia lending some weight to chronicity, with superadded acute  blood loss.  The patient was managed with transfusion of a total of 6 units  of packed red blood cells and 2 units of fresh frozen plasma, resulting in a  post transfusion bump of hemoglobin to 8.6.  His presyncopal symptoms  disappeared, and he had no further episodes of melena or hematochezia.  He  underwent upper/lower GI endoscopy on July 22, 2006 by Dr. Jeani Hawking,  gastroenterologist, who was called on consultation.  For details of  findings, please refer to procedure list above.  Suffice it to say, that he  had grade A esophagitis, duodenal erosions, colonic diverticula, AVM's and   internal/external hemorrhoids.  His hemoglobin levels have remained stable.  He has been commenced on iron supplements and as of July 24, 2006, his  hemoglobin was 8.7 with hematocrit of 26.0.  Per gastroenterology  recommendations, he has been placed on a proton pump inhibitor in a b.i.d.  dose which he is to continue for the next one month, and thereafter,  maintenance daily dose.  Per Dr. Elnoria Howard, the patient may have bled from small  bowel AVM's.  He has therefore arranged for a capsule endoscopy to be  performed on an outpatient basis in his office.   PROBLEM #2: HYPERTENSION:  This was managed with a combination of calcium  channel blocker and beta blocker.  The patient's pre-admission Benicar has  been discontinued, because of renal insufficiency.   PROBLEM #3:  BENIGN PROSTATIC HYPERTROPHY:  There were no symptoms referable  to this during the course of this hospitalization.   PROBLEM #4:  CHRONIC RENAL INSUFFICIENCY:  At the time of presentation, the  patient had a BUN of 51 and creatinine of 3.2.  This was deemed secondary to  known history of chronic renal insufficiency against background of biopsy  confirmed glomerulosclerosis.  With intravenous fluid hydration, renal  insufficiency improved somewhat and on July 24, 2006, BUN was 23 with  creatinine of 2.3.  The patient's ARB has been discontinued in view of renal  dysfunction.  We recommend his primary M.D., Dr. Renaye Rakers, continue to  monitor his renal indices, and perhaps arrange a referral to nephrologist at  her own discretion.   DISPOSITION:  The patient was considered subsequently recovered, clinically  stable and asymptomatic, to be discharged on July 24, 2006.   DISCHARGE INSTRUCTIONS/RESTRICTIONS:   DIET:  Heart-healthy diet.   ACTIVITY:  As tolerated.  Recommended to increase activity slowly.   WOUND CARE:  Not applicable.  PAIN MANAGEMENT:  Not applicable.   FOLLOWUP INSTRUCTIONS:  The patient  is instructed to followup with Dr. Elnoria Howard,  gastroenterologist, at a date to be determined.  Also to followup with Dr.  Renaye Rakers, his primary care physician, within one week of discharge.  The  patient has been instructed to call for an appointment.   SPECIAL INSTRUCTIONS:  Dr. Elnoria Howard will arrange an outpatient capsule  endoscopy.  The patient has been instructed to avoid nonsteroidal anti-  inflammatory medications, in view of renal dysfunction as well as  GERD/duodenal erosions.  All of this has been communicated to the patient,  who has verbalized understanding.      Isidor Holts, M.D.  Electronically Signed  CO/MEDQ  D:  07/24/2006  T:  07/24/2006  Job:  [id number]   cc:   Renaye Rakers, M.D.  Jordan Hawks Elnoria Howard, MD

## 2011-01-22 NOTE — Procedures (Signed)
[place]. [facility]  Patient:    [name], [name]                         MRN: [id number] Proc. Date: 01/28/00 Adm. Date:  [id number] Disc. Date: [id number] Attending:  Sharyn Dross CC:         Lindell Spar. Chestine Spore, M.D.                           Procedure Report  REFERRING PHYSICIAN:  Lindell Spar. Chestine Spore, M.D.  PREOPERATIVE DIAGNOSIS:  Anemia rule out secondary to gastrointestinal blood loss.  POSTOPERATIVE DIAGNOSIS:  Grossly normal colonoscopic examination.  PROCEDURE PERFORMED:  Colonoscopy.  MEDICATIONS USED:  Demerol 50 mg IV, Versed 5 mg IV over a 10-minute period of time.  INSTRUMENT USED:  Olympus video pancolonoscope.  ENDOSCOPIST:  Dortha Kern, M.D.  INDICATIONS:  This pleasant gentleman was referred for evaluation  because of anemia that was present at this time.  The patient has noticed a drop in his hemoglobin and hematocrit that was present and documented by his primary physician.  He was subsequently referred for evaluation of the anemia at this time.  OBJECTIVE FINDINGS:  He is a pleasant gentleman who appears to be in no distress.  His vital signs are stable.  The HEENT examination was anicteric. Neck was supple.  Lungs were clear.  Heart had a regular rate and rhythm without heaves, thrills, murmurs or gallop.  The abdomen was soft, no tenderness, no hepatosplenomegaly.  Digital rectal exam was unremarkable.  The extremities showed no cyanosis, clubbing or edema.  PLAN:  To proceed with the colonoscopic examination.  INFORMED CONSENT:  The patient was advised of the procedure, the indications and the risks involved.  The patient has agreed to have the procedure performed.  A video was reviewed and the consent form was obtained.  PREOPERATIVE PREPARATION:  The patient was brought to the endoscopy unit where an IV for IV sedating medication was started.  A monitor was placed on the patient to monitor the patients vital signs and oxygen  saturation.  Nasal oxygen at two liters a minute was used and after adequate sedation was performed, the procedure was begun.  BOWEL PREP:  The patient was given GoLYTELY and Reglan as a bowel prep at this time.  The patient tolerated the prep well without any complications.  The quality of the prep was poor to fair.  DESCRIPTION OF PROCEDURE:  The instrument was advanced with the patient lying in the left lateral position via direct technique without difficulty to approximately 90 cm in the proximal colon that was noted.  Because of the poor bowel prep, the instrument could not be advanced further at this time due to increased amounts of liquidy stools that was present.  Also poor visualization that was appreciated at this time.  There appeared to be no gross abnormalities appreciated such as masses, polyps or stricture lesions appreciated.  The vascular pattern although obscured in areas, was well visualized in other regions and appeared to be well within normal limits at this time.  The mucosal pattern showed no evidence of diverticular changes or any appreciable glandular changes at this time.  There was no difficulty in traversing the region except some increased redundancy when the instrument reached well into the area at this time.  As it was retracted back, the adjustment appeared to be about 90 cm but the  instrument had gone further at that point but appeared to be some redundancy that was noted at this time.  The instrument was gradually retracted back and there was no evidence of any internal or external hemorrhoids upon exiting from the area.  Several washings had to be done throughout the procedure to clear the mucosal lining as well as for visualization at this point.  The instrument was subsequently removed per rectum without difficulty.  The patient tolerated the procedure well.  TREATMENT: 1. I am going to be conservative with therapy at this time. 2. May need to  do a barium enema on the patient to evaluate the right side of    the colon to ensure no other abnormalities are noted and depending on the    results to determine the course of therapy. Will have the patient follow up    in the office during the interim. DD:  01/28/00 TD:  02/01/00 Job: [id number]

## 2011-01-22 NOTE — Consult Note (Signed)
NAMESHANNA, STRENGTH                ACCOUNT NO.:  [id number]   MEDICAL RECORD NO.:  [id number]          PATIENT TYPE:  INP   LOCATION:  1848                         FACILITY:  [facility]   PHYSICIAN:  Jordan Hawks. Elnoria Howard, MD    DATE OF BIRTH:  Jan 14, 1932   DATE OF CONSULTATION:  07/21/2006  DATE OF DISCHARGE:                                 CONSULTATION   REASON FOR CONSULTATION:  Severe anemia.   PRIMARY CARE Majesty Oehlert:  Renaye Rakers, M.D.   HISTORY OF PRESENT ILLNESS:  This is 77 year old gentleman with a past  medical history of hypertension and BPH, as well as diverticulosis, who  was admitted to the hospital for further evaluation of anemia.  The  patient was noted to present to his primary care Mabry Santarelli's office with  complaints of progressive weakness for the past several weeks, and a  blood work was done which revealed that he was significantly anemic.  In  the emergency room, the patient was confirmed to have anemia of  approximately 3.9.  The patient denies any chest pain or significant  shortness of breath.  During my interview, the patient denied having any  problems with hematochezia or melena.  However, this history obtained by  [facility] physicians does report that he complained of having dark red  diarrheal stools.  The patient has had had a colonoscopy approximately 5  years ago by Dr. Tad Moore with findings of diverticulosis; no evidence of  any cancers.  The patient has a history of back pain, and he has used  NSAIDs sporadically.   PAST MEDICAL HISTORY AND PAST SURGICAL HISTORY:  As stated above with  the addition of cellulitis in his right lower extremity, MRSA diskitis,  and glomerulosclerosis.   FAMILY HISTORY:  Noncontributory for the current situation.   SOCIAL HISTORY:  The patient is retired, married.  No tobacco, alcohol  or illicit drug use.   ALLERGIES:  CLONIDINE which results in hypotension and arrhythmia.   MEDICATIONS:  1. Benicar.  2. Baby aspirin.  3.  Norvasc.  4. __________.   REVIEW OF SYSTEMS:  As per the history of present illness.  No  complaints of any blurry vision.  Positive for dizziness.  No dysphagia,  dysarthria, chest pain, shortness of breath, abdominal pain, fevers,  chills, constipation, lower extremity swelling, skin changes or new  neurologic findings.   PHYSICAL EXAMINATION:  VITAL SIGNS:  Blood pressure is 132/64, heart  rate is 85, temperature is 97.4.  GENERAL:  The patient is in no acute distress, alert and oriented.  HEENT:  Normocephalic and atraumatic.  Extraocular muscles intact.  Pupils equal, round, reactive to light.  NECK:  Supple.  No lymphadenopathy.  LUNGS:  Clear to auscultation bilaterally.  CARDIOVASCULAR:  Regular rate and rhythm.  ABDOMEN:  Obese, soft, nontender, nondistended.  EXTREMITIES:  No clubbing, cyanosis or edema.  RECTAL:  Positive for dark stool that is heme positive.   LABORATORY DATA:  White blood cell count is 5.5, hemoglobin 3.9,  platelets at 238.  Sodium was 141, potassium 5.0, chloride is at 116,  CO2 of  117, BUN is 51, creatinine 3.2, glucose of 223, MCV of 72.3.  PT  is 15.8, PTT is 30.   IMPRESSION:  1. Iron deficiency anemia.  2. Hypertension.  3. History of back pain.   After evaluation of the patient, it is clear that he requires further  evaluation with an EGD and colonoscopy.  Most likely, his bleeding has  been progressive over several weeks.  Considerations are diverticulosis,  a malignancy, ulcers or possible small bowel AVMs.  Further evaluation  and will be conducted.   PLAN AT THIS TIME:  1. Agree with no blood transfusions.  2. EGD and colonoscopy tomorrow.      Jordan Hawks Elnoria Howard, MD  Electronically Signed     PDH/MEDQ  D:  07/21/2006  T:  07/22/2006  Job:  [id number]   cc:   Renaye Rakers, M.D.

## 2011-01-22 NOTE — Discharge Summary (Signed)
[place]. [facility]  Patient:    [name], [name] Visit Number: [id number] MRN: [id number]          Service Type: MED Location: [id number] Attending Physician:  Beverely Low Dictated by:   Geraldo Pitter, M.D. 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  The patient has had problems with this leg in the past.  Has had grafting done to this site, and because of a severe cellulitis in the past, and today presented with the area basically denute of skin, extremely erythematous, and it was felt that hospitalization was appropriate.  He was placed in the hospital.  HOSPITAL COURSE:  Started on IV antibiotics, Unasyn 3 g IV q.8h.  He underwent a bone scan which was not diagnostic of a cellulitis.  He also had arterial evaluation which was felt to be within normal limits by Doppler.  He was seen in consultation by infectious disease who agreed with the antibiotic choice that we had him on, and suggested that we might want to have the wound care nurse see him.  She saw the patient, and noted that the patients leg was extremely swollen, felt that we should go ahead and put him in a special cast wrapped from the foot to the upper leg.  That was done.  Note that we had sent the patient to physical therapy with hydrotherapy to try to clean the wound and keep the wound cleaned properly.  He continued to do well, and the next day after having that done, the leg, though not visualized because of the wrapping, appeared to be smaller, and he appeared to be in no pain.  The plan was to keep this dressing on for about a week, and then to have him come back and be seen by the wound care nurse.  He was discharged from the hospital in improved state with the diagnosis of  cellulitis of the leg.  Culture grew Staphylococcus aureus.  He was also placed on Pepcid, Augmentin XR 1000 mg two pills b.i.d., Zyrtec 10 mg because of his itching.  He had severe itching during this hospitalization also.  CONDITION ON DISCHARGE:  Improved. Dictated by:   Geraldo Pitter, M.D. Attending Physician:  Beverely Low DD:  12/21/01 TD:  12/23/01 Job: [id number]

## 2011-01-22 NOTE — Consult Note (Signed)
[place]. [facility]  Patient:    [name], [name]                         MRN: [id number] Proc. Date: 04/19/00 Adm. Date:  [id number] Attending:  Lynann Bologna CC:         Jarome Matin, M.D.                          Consultation Report  REASON FOR CONSULTATION:  Inability to urinate and gross hematuria.  HISTORY OF PRESENT ILLNESS:  The patient is a 77 year old male who had a needle biopsy of the prostate done two weeks ago.  He had gross hematuria after the biopsy and about five days ago, he had a Foley catheter inserted in the bladder for urinary retention.  The Foley catheter stopped draining two days ago, and he returned to the emergency room.  His potassium then went up to 6.4, BUN was 78, and creatinine was 4.2.  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  He had a kidney biopsy about two weeks ago, and he had surgery in his right foot for infection.  He has a history of hypertension.  ALLERGIES:  GRASS, WEEDS, CIGARETTES.  MEDICATIONS:  Verapamil, Allegra, and Rhinocort.  SOCIAL HISTORY:  He is married, has three children.  He does not smoke and drinks occasionally.  FAMILY HISTORY:  His father and mother died of old age.  He has one brother and two sisters.  REVIEW OF SYSTEMS:  The patient denied any symptoms of frequency, hesitancy, or hematuria before the biopsy.  He had nocturia x 1.  PHYSICAL EXAMINATION:  GENITOURINARY:  He has no CVA tenderness.  His kidneys are not palpable.  His bladder is not distended.  His penis is uncircumcised.  The meatus is normal. Scrotum, cords, testicles, and epididymis are all normal.  RECTAL:  His sphincter tone is normal.  His prostate is enlarged, _____, and there are no nodules.  LABORATORY DATA:  His BUN is now 64 with a creatinine of 3.9 from 4.2. Potassium is down to 4.4.  His hemoglobin is 8.0, and hematocrit is  23.5.  IMPRESSION: 1. Gross hematuria, status post renal biopsy. 2. Urinary retention secondary to blood clots. 3. Benign prostatic hypertrophy.  SUGGESTION:  Remove the Foley catheter, since urine is clear today.  Check his PSA, and I agree with Dr. Cindra Presume plan to obtain an MRI of the kidneys. Further recommendations will depend on the results of the CT scan. DD:  04/19/00 TD:  04/19/00 Job: [id number]

## 2011-01-29 ENCOUNTER — Encounter (HOSPITAL_COMMUNITY): Payer: Medicare Other

## 2011-01-29 ENCOUNTER — Other Ambulatory Visit: Payer: Self-pay | Admitting: Nephrology

## 2011-01-29 LAB — POCT HEMOGLOBIN-HEMACUE: Hemoglobin: 11.1 g/dL — ABNORMAL LOW (ref 13.0–17.0)

## 2011-02-05 ENCOUNTER — Other Ambulatory Visit: Payer: Self-pay | Admitting: Nephrology

## 2011-02-05 ENCOUNTER — Encounter (HOSPITAL_COMMUNITY): Payer: Medicare Other | Attending: Nephrology

## 2011-02-05 DIAGNOSIS — E119 Type 2 diabetes mellitus without complications: Secondary | ICD-10-CM | POA: Insufficient documentation

## 2011-02-05 DIAGNOSIS — N039 Chronic nephritic syndrome with unspecified morphologic changes: Secondary | ICD-10-CM | POA: Insufficient documentation

## 2011-02-05 DIAGNOSIS — D631 Anemia in chronic kidney disease: Secondary | ICD-10-CM | POA: Insufficient documentation

## 2011-02-05 DIAGNOSIS — N189 Chronic kidney disease, unspecified: Secondary | ICD-10-CM | POA: Insufficient documentation

## 2011-02-05 DIAGNOSIS — I129 Hypertensive chronic kidney disease with stage 1 through stage 4 chronic kidney disease, or unspecified chronic kidney disease: Secondary | ICD-10-CM | POA: Insufficient documentation

## 2011-02-05 LAB — RENAL FUNCTION PANEL
Albumin: 4.2 g/dL (ref 3.5–5.2)
BUN: 70 mg/dL — ABNORMAL HIGH (ref 6–23)
Calcium: 8.6 mg/dL (ref 8.4–10.5)
Creatinine, Ser: 5.54 mg/dL — ABNORMAL HIGH (ref 0.4–1.5)
Glucose, Bld: 118 mg/dL — ABNORMAL HIGH (ref 70–99)
Phosphorus: 3.8 mg/dL (ref 2.3–4.6)
Potassium: 5.4 mEq/L — ABNORMAL HIGH (ref 3.5–5.1)

## 2011-02-05 LAB — POCT HEMOGLOBIN-HEMACUE: Hemoglobin: 11.4 g/dL — ABNORMAL LOW (ref 13.0–17.0)

## 2011-02-10 ENCOUNTER — Ambulatory Visit: Payer: Self-pay | Admitting: Vascular Surgery

## 2011-02-12 ENCOUNTER — Encounter (HOSPITAL_COMMUNITY): Payer: Medicare Other

## 2011-02-12 ENCOUNTER — Other Ambulatory Visit: Payer: Self-pay | Admitting: Nephrology

## 2011-02-17 ENCOUNTER — Ambulatory Visit: Payer: Self-pay | Admitting: Vascular Surgery

## 2011-02-19 ENCOUNTER — Encounter (HOSPITAL_COMMUNITY): Payer: Medicare Other

## 2011-02-26 ENCOUNTER — Other Ambulatory Visit: Payer: Self-pay | Admitting: Nephrology

## 2011-02-26 ENCOUNTER — Encounter (HOSPITAL_COMMUNITY): Payer: Medicare Other

## 2011-02-26 LAB — RENAL FUNCTION PANEL
CO2: 16 mEq/L — ABNORMAL LOW (ref 19–32)
Calcium: 7.8 mg/dL — ABNORMAL LOW (ref 8.4–10.5)
Chloride: 109 mEq/L (ref 96–112)
Creatinine, Ser: 4.95 mg/dL — ABNORMAL HIGH (ref 0.50–1.35)
Glucose, Bld: 122 mg/dL — ABNORMAL HIGH (ref 70–99)

## 2011-03-12 ENCOUNTER — Other Ambulatory Visit: Payer: Self-pay | Admitting: Nephrology

## 2011-03-12 ENCOUNTER — Encounter (HOSPITAL_COMMUNITY): Payer: Medicare Other | Attending: Nephrology

## 2011-03-12 DIAGNOSIS — D631 Anemia in chronic kidney disease: Secondary | ICD-10-CM | POA: Insufficient documentation

## 2011-03-12 DIAGNOSIS — N189 Chronic kidney disease, unspecified: Secondary | ICD-10-CM | POA: Insufficient documentation

## 2011-03-12 DIAGNOSIS — I129 Hypertensive chronic kidney disease with stage 1 through stage 4 chronic kidney disease, or unspecified chronic kidney disease: Secondary | ICD-10-CM | POA: Insufficient documentation

## 2011-03-12 DIAGNOSIS — N039 Chronic nephritic syndrome with unspecified morphologic changes: Secondary | ICD-10-CM | POA: Insufficient documentation

## 2011-03-12 DIAGNOSIS — E119 Type 2 diabetes mellitus without complications: Secondary | ICD-10-CM | POA: Insufficient documentation

## 2011-03-26 ENCOUNTER — Other Ambulatory Visit: Payer: Self-pay | Admitting: Nephrology

## 2011-03-26 ENCOUNTER — Encounter (HOSPITAL_COMMUNITY): Payer: Medicare Other

## 2011-03-26 LAB — POCT HEMOGLOBIN-HEMACUE: Hemoglobin: 13.1 g/dL (ref 13.0–17.0)

## 2011-03-26 LAB — RENAL FUNCTION PANEL
CO2: 18 mEq/L — ABNORMAL LOW (ref 19–32)
Calcium: 8.9 mg/dL (ref 8.4–10.5)
GFR calc Af Amer: 12 mL/min — ABNORMAL LOW (ref >60)
GFR calc non Af Amer: 10 mL/min — ABNORMAL LOW (ref >60)
Glucose, Bld: 115 mg/dL — ABNORMAL HIGH (ref 70–99)
Phosphorus: 5 mg/dL — ABNORMAL HIGH (ref 2.3–4.6)
Potassium: 5.9 mEq/L — ABNORMAL HIGH (ref 3.5–5.1)
Sodium: 138 mEq/L (ref 135–145)

## 2011-04-09 ENCOUNTER — Other Ambulatory Visit: Payer: Self-pay | Admitting: Nephrology

## 2011-04-09 ENCOUNTER — Encounter (HOSPITAL_COMMUNITY): Payer: Medicare Other | Attending: Nephrology

## 2011-04-09 DIAGNOSIS — E119 Type 2 diabetes mellitus without complications: Secondary | ICD-10-CM | POA: Insufficient documentation

## 2011-04-09 DIAGNOSIS — N189 Chronic kidney disease, unspecified: Secondary | ICD-10-CM | POA: Insufficient documentation

## 2011-04-09 DIAGNOSIS — I129 Hypertensive chronic kidney disease with stage 1 through stage 4 chronic kidney disease, or unspecified chronic kidney disease: Secondary | ICD-10-CM | POA: Insufficient documentation

## 2011-04-09 DIAGNOSIS — D631 Anemia in chronic kidney disease: Secondary | ICD-10-CM | POA: Insufficient documentation

## 2011-04-09 LAB — RENAL FUNCTION PANEL
Albumin: 4.4 g/dL (ref 3.5–5.2)
CO2: 18 mEq/L — ABNORMAL LOW (ref 19–32)
Calcium: 8.4 mg/dL (ref 8.4–10.5)
Chloride: 108 mEq/L (ref 96–112)
Creatinine, Ser: 5.33 mg/dL — ABNORMAL HIGH (ref 0.50–1.35)
GFR calc Af Amer: 13 mL/min — ABNORMAL LOW (ref >60)
GFR calc non Af Amer: 11 mL/min — ABNORMAL LOW (ref >60)
Sodium: 137 mEq/L (ref 135–145)

## 2011-04-16 ENCOUNTER — Encounter (HOSPITAL_COMMUNITY): Payer: Medicare Other

## 2011-04-16 ENCOUNTER — Other Ambulatory Visit: Payer: Self-pay | Admitting: Nephrology

## 2011-04-23 ENCOUNTER — Other Ambulatory Visit: Payer: Self-pay | Admitting: Nephrology

## 2011-04-23 ENCOUNTER — Encounter (HOSPITAL_COMMUNITY)
Admission: RE | Admit: 2011-04-23 | Discharge: 2011-04-23 | Payer: Medicare Other | Source: Ambulatory Visit | Attending: Nephrology | Admitting: Nephrology

## 2011-04-23 LAB — CBC
HCT: 35.3 % — ABNORMAL LOW (ref 39.0–52.0)
MCHC: 32.3 g/dL (ref 30.0–36.0)
MCV: 98.3 fL (ref 78.0–100.0)
Platelets: 141 10*3/uL — ABNORMAL LOW (ref 150–400)
RDW: 17.1 % — ABNORMAL HIGH (ref 11.5–15.5)
WBC: 3.8 10*3/uL — ABNORMAL LOW (ref 4.0–10.5)

## 2011-04-30 ENCOUNTER — Encounter (HOSPITAL_COMMUNITY): Payer: Medicare Other

## 2011-04-30 ENCOUNTER — Other Ambulatory Visit: Payer: Self-pay | Admitting: Nephrology

## 2011-05-14 ENCOUNTER — Other Ambulatory Visit: Payer: Self-pay | Admitting: Nephrology

## 2011-05-14 ENCOUNTER — Encounter (HOSPITAL_COMMUNITY): Payer: Medicare Other | Attending: Nephrology

## 2011-05-14 DIAGNOSIS — E119 Type 2 diabetes mellitus without complications: Secondary | ICD-10-CM | POA: Insufficient documentation

## 2011-05-14 DIAGNOSIS — I129 Hypertensive chronic kidney disease with stage 1 through stage 4 chronic kidney disease, or unspecified chronic kidney disease: Secondary | ICD-10-CM | POA: Insufficient documentation

## 2011-05-14 DIAGNOSIS — D631 Anemia in chronic kidney disease: Secondary | ICD-10-CM | POA: Insufficient documentation

## 2011-05-14 DIAGNOSIS — N039 Chronic nephritic syndrome with unspecified morphologic changes: Secondary | ICD-10-CM | POA: Insufficient documentation

## 2011-05-14 DIAGNOSIS — N189 Chronic kidney disease, unspecified: Secondary | ICD-10-CM | POA: Insufficient documentation

## 2011-05-14 LAB — RENAL FUNCTION PANEL
BUN: 104 mg/dL — ABNORMAL HIGH (ref 6–23)
CO2: 15 mEq/L — ABNORMAL LOW (ref 19–32)
Calcium: 8.2 mg/dL — ABNORMAL LOW (ref 8.4–10.5)
Creatinine, Ser: 5.74 mg/dL — ABNORMAL HIGH (ref 0.50–1.35)
Glucose, Bld: 124 mg/dL — ABNORMAL HIGH (ref 70–99)
Phosphorus: 5.8 mg/dL — ABNORMAL HIGH (ref 2.3–4.6)
Sodium: 141 mEq/L (ref 135–145)

## 2011-05-28 ENCOUNTER — Other Ambulatory Visit: Payer: Self-pay | Admitting: Nephrology

## 2011-05-28 ENCOUNTER — Encounter (HOSPITAL_COMMUNITY)
Admission: RE | Admit: 2011-05-28 | Discharge: 2011-05-28 | Payer: Medicare Other | Source: Ambulatory Visit | Attending: Nephrology | Admitting: Nephrology

## 2011-05-28 LAB — CBC
Hemoglobin: 12.9 g/dL — ABNORMAL LOW (ref 13.0–17.0)
MCH: 32.7 pg (ref 26.0–34.0)
Platelets: 80 10*3/uL — ABNORMAL LOW (ref 150–400)
RBC: 3.94 MIL/uL — ABNORMAL LOW (ref 4.22–5.81)
WBC: 3.2 10*3/uL — ABNORMAL LOW (ref 4.0–10.5)

## 2011-05-28 LAB — HEMOGLOBIN: Hemoglobin: 9 — ABNORMAL LOW

## 2011-05-31 LAB — CBC
HCT: 27.3 — ABNORMAL LOW
Hemoglobin: 8.9 — ABNORMAL LOW
MCV: 81.3
Platelets: 225
RBC: 3.35 — ABNORMAL LOW
WBC: 4.7

## 2011-06-01 LAB — RENAL FUNCTION PANEL
BUN: 76 — ABNORMAL HIGH
Creatinine, Ser: 3.92 — ABNORMAL HIGH
Glucose, Bld: 138 — ABNORMAL HIGH
Phosphorus: 4.8 — ABNORMAL HIGH
Potassium: 5.9 — ABNORMAL HIGH

## 2011-06-01 LAB — CBC
HCT: 26.4 — ABNORMAL LOW
Hemoglobin: 8.4 — ABNORMAL LOW
MCV: 85.5
Platelets: 206
RDW: 20.2 — ABNORMAL HIGH

## 2011-06-02 LAB — CBC
HCT: 27.5 — ABNORMAL LOW
MCHC: 32.9
MCV: 86.4
Platelets: 262
RBC: 3.19 — ABNORMAL LOW
WBC: 5.7

## 2011-06-02 LAB — RENAL FUNCTION PANEL
BUN: 75 — ABNORMAL HIGH
CO2: 20
Calcium: 8.7
Chloride: 111
Creatinine, Ser: 4.56 — ABNORMAL HIGH

## 2011-06-03 LAB — RENAL FUNCTION PANEL
BUN: 84 — ABNORMAL HIGH
CO2: 17 — ABNORMAL LOW
Calcium: 8.1 — ABNORMAL LOW
Creatinine, Ser: 3.79 — ABNORMAL HIGH
Glucose, Bld: 162 — ABNORMAL HIGH

## 2011-06-03 LAB — CBC
HCT: 26.6 — ABNORMAL LOW
Hemoglobin: 8.4 — ABNORMAL LOW
MCHC: 31.8
MCV: 84.3
RBC: 3.16 — ABNORMAL LOW

## 2011-06-03 LAB — URIC ACID: Uric Acid, Serum: 6.9

## 2011-06-04 LAB — RENAL FUNCTION PANEL
BUN: 79 — ABNORMAL HIGH
Creatinine, Ser: 4.02 — ABNORMAL HIGH
Glucose, Bld: 165 — ABNORMAL HIGH
Phosphorus: 6.8 — ABNORMAL HIGH
Potassium: 5.4 — ABNORMAL HIGH

## 2011-06-04 LAB — CBC
HCT: 27.3 — ABNORMAL LOW
Hemoglobin: 8.6 — ABNORMAL LOW
MCHC: 31.6
MCV: 83.3
RDW: 19.6 — ABNORMAL HIGH

## 2011-06-07 LAB — CBC
HCT: 28 — ABNORMAL LOW
Hemoglobin: 9.1 — ABNORMAL LOW
MCHC: 32.1
MCV: 82.6
MCV: 81.1
Platelets: 181
RBC: 3.49 — ABNORMAL LOW
WBC: 3 — ABNORMAL LOW

## 2011-06-07 LAB — RENAL FUNCTION PANEL
Albumin: 4.1
BUN: 79 — ABNORMAL HIGH
CO2: 18 — ABNORMAL LOW
CO2: 20
Calcium: 8.6
Chloride: 110
Creatinine, Ser: 3.9 — ABNORMAL HIGH
Creatinine, Ser: 3.93 — ABNORMAL HIGH
Glucose, Bld: 158 — ABNORMAL HIGH

## 2011-06-08 LAB — CBC
Hemoglobin: 9.2 g/dL — ABNORMAL LOW (ref 13.0–17.0)
MCHC: 32.1 g/dL (ref 30.0–36.0)
MCV: 82.4 fL (ref 78.0–100.0)
RBC: 3.49 MIL/uL — ABNORMAL LOW (ref 4.22–5.81)

## 2011-06-08 LAB — RENAL FUNCTION PANEL
CO2: 24 mEq/L (ref 19–32)
Calcium: 8.3 mg/dL — ABNORMAL LOW (ref 8.4–10.5)
Creatinine, Ser: 3.8 mg/dL — ABNORMAL HIGH (ref 0.4–1.5)
Glucose, Bld: 155 mg/dL — ABNORMAL HIGH (ref 70–99)
Sodium: 139 mEq/L (ref 135–145)

## 2011-06-08 LAB — URIC ACID: Uric Acid, Serum: 7.1 mg/dL (ref 4.0–7.8)

## 2011-06-10 LAB — RENAL FUNCTION PANEL
Calcium: 8.2 mg/dL — ABNORMAL LOW (ref 8.4–10.5)
Glucose, Bld: 167 mg/dL — ABNORMAL HIGH (ref 70–99)
Phosphorus: 4.5 mg/dL (ref 2.3–4.6)
Sodium: 135 mEq/L (ref 135–145)

## 2011-06-10 LAB — POCT HEMOGLOBIN-HEMACUE: Hemoglobin: 9 g/dL — ABNORMAL LOW (ref 13.0–17.0)

## 2011-06-11 ENCOUNTER — Encounter (HOSPITAL_COMMUNITY)
Admission: RE | Admit: 2011-06-11 | Discharge: 2011-06-11 | Disposition: A | Discharge status: Home / Self Care | Payer: Medicare Other | Source: Ambulatory Visit | Attending: Nephrology | Admitting: Nephrology

## 2011-06-11 ENCOUNTER — Other Ambulatory Visit: Payer: Self-pay | Admitting: Nephrology

## 2011-06-11 DIAGNOSIS — D631 Anemia in chronic kidney disease: Secondary | ICD-10-CM | POA: Insufficient documentation

## 2011-06-11 DIAGNOSIS — I129 Hypertensive chronic kidney disease with stage 1 through stage 4 chronic kidney disease, or unspecified chronic kidney disease: Secondary | ICD-10-CM | POA: Insufficient documentation

## 2011-06-11 DIAGNOSIS — E119 Type 2 diabetes mellitus without complications: Secondary | ICD-10-CM | POA: Insufficient documentation

## 2011-06-11 DIAGNOSIS — N039 Chronic nephritic syndrome with unspecified morphologic changes: Secondary | ICD-10-CM | POA: Insufficient documentation

## 2011-06-11 DIAGNOSIS — N189 Chronic kidney disease, unspecified: Secondary | ICD-10-CM | POA: Insufficient documentation

## 2011-06-11 LAB — CBC
HCT: 22.6 — ABNORMAL LOW
Hemoglobin: 7.3 — CL
MCHC: 32.3
MCV: 79.1
RDW: 20.1 — ABNORMAL HIGH

## 2011-06-14 LAB — COMPREHENSIVE METABOLIC PANEL
ALT: 9
AST: 17
Albumin: 4
Alkaline Phosphatase: 42
BUN: 52 — ABNORMAL HIGH
Calcium: 8.3 — ABNORMAL LOW
Chloride: 116 — ABNORMAL HIGH
Creatinine, Ser: 3.73 — ABNORMAL HIGH
GFR calc Af Amer: 15 — ABNORMAL LOW
GFR calc non Af Amer: 16 — ABNORMAL LOW
Glucose, Bld: 155 — ABNORMAL HIGH
Potassium: 4.7
Sodium: 140
Total Bilirubin: 1
Total Protein: 6.5

## 2011-06-14 LAB — CBC
HCT: 20.3 — ABNORMAL LOW
Hemoglobin: 6.6 — CL
Hemoglobin: 7.3 — CL
MCHC: 30.9
MCV: 77.5 — ABNORMAL LOW
Platelets: 116 — ABNORMAL LOW
RBC: 1.44 — ABNORMAL LOW
RBC: 2.77 — ABNORMAL LOW
RDW: 19.5 — ABNORMAL HIGH
RDW: 20.3 — ABNORMAL HIGH
RDW: 20.9 — ABNORMAL HIGH
WBC: 4.9
WBC: 5.3

## 2011-06-14 LAB — DIFFERENTIAL
Basophils Absolute: 0
Basophils Absolute: 0
Basophils Relative: 0
Eosinophils Relative: 2
Lymphocytes Relative: 10 — ABNORMAL LOW
Lymphocytes Relative: 10 — ABNORMAL LOW
Lymphocytes Relative: 18
Lymphs Abs: 0.4 — ABNORMAL LOW
Lymphs Abs: 0.9
Monocytes Absolute: 0.6
Monocytes Relative: 11
Monocytes Relative: 9
Neutro Abs: 3.3
Neutro Abs: 3.5
Neutro Abs: 3.8
Neutrophils Relative %: 77

## 2011-06-14 LAB — CROSSMATCH

## 2011-06-14 LAB — BLOOD GAS, ARTERIAL
Acid-base deficit: 8.4 — ABNORMAL HIGH
Bicarbonate: 15.8 — ABNORMAL LOW
FIO2: 0.21
O2 Saturation: 98.8
Patient temperature: 98.7
pO2, Arterial: 140 — ABNORMAL HIGH

## 2011-06-14 LAB — RENAL FUNCTION PANEL
Albumin: 3.4 — ABNORMAL LOW
BUN: 47 — ABNORMAL HIGH
CO2: 20
Chloride: 112
Potassium: 4.4

## 2011-06-14 LAB — HEMOGLOBINOPATHY EVALUATION
Hemoglobin Other: 0 (ref 0.0–0.0)
Hgb A: 97.9 % — ABNORMAL HIGH
Hgb F Quant: 0 (ref 0.0–2.0)

## 2011-06-14 LAB — URINALYSIS, ROUTINE W REFLEX MICROSCOPIC
Hgb urine dipstick: NEGATIVE
Nitrite: NEGATIVE
Protein, ur: 30 — AB
Specific Gravity, Urine: 1.016
Urobilinogen, UA: 0.2

## 2011-06-14 LAB — URINE MICROSCOPIC-ADD ON

## 2011-06-14 LAB — PROTEIN ELECTROPH W RFLX QUANT IMMUNOGLOBULINS
Alpha-1-Globulin: 4.6
Beta 2: 4.2
Beta Globulin: 8.2 — ABNORMAL HIGH
Gamma Globulin: 14.1

## 2011-06-14 LAB — PSA: PSA: 0.5

## 2011-06-14 LAB — HEMOGLOBIN A1C: Mean Plasma Glucose: 108

## 2011-06-14 LAB — OCCULT BLOOD X 1 CARD TO LAB, STOOL: Fecal Occult Bld: POSITIVE

## 2011-06-18 ENCOUNTER — Encounter (HOSPITAL_COMMUNITY): Payer: Medicare Other

## 2011-06-18 ENCOUNTER — Other Ambulatory Visit: Payer: Self-pay | Admitting: Nephrology

## 2011-06-18 LAB — RENAL FUNCTION PANEL
CO2: 18 mEq/L — ABNORMAL LOW (ref 19–32)
Calcium: 9.2 mg/dL (ref 8.4–10.5)
GFR calc Af Amer: 9 mL/min — ABNORMAL LOW (ref >90)
GFR calc non Af Amer: 8 mL/min — ABNORMAL LOW (ref >90)
Phosphorus: 4.2 mg/dL (ref 2.3–4.6)
Sodium: 139 mEq/L (ref 135–145)

## 2011-06-18 LAB — POCT HEMOGLOBIN-HEMACUE: Hemoglobin: 10.2 g/dL — ABNORMAL LOW (ref 13.0–17.0)

## 2011-06-23 LAB — CROSSMATCH

## 2011-06-25 ENCOUNTER — Encounter (HOSPITAL_COMMUNITY): Payer: Medicare Other

## 2011-06-25 ENCOUNTER — Other Ambulatory Visit: Payer: Self-pay | Admitting: Nephrology

## 2011-06-25 LAB — POCT HEMOGLOBIN-HEMACUE: Hemoglobin: 11.3 g/dL — ABNORMAL LOW (ref 13.0–17.0)

## 2011-07-09 ENCOUNTER — Other Ambulatory Visit: Payer: Self-pay | Admitting: Nephrology

## 2011-07-09 ENCOUNTER — Encounter (HOSPITAL_COMMUNITY): Payer: Medicare Other

## 2011-07-09 DIAGNOSIS — N189 Chronic kidney disease, unspecified: Secondary | ICD-10-CM | POA: Insufficient documentation

## 2011-07-09 DIAGNOSIS — D631 Anemia in chronic kidney disease: Secondary | ICD-10-CM | POA: Insufficient documentation

## 2011-07-09 DIAGNOSIS — I129 Hypertensive chronic kidney disease with stage 1 through stage 4 chronic kidney disease, or unspecified chronic kidney disease: Secondary | ICD-10-CM | POA: Insufficient documentation

## 2011-07-09 DIAGNOSIS — E119 Type 2 diabetes mellitus without complications: Secondary | ICD-10-CM | POA: Insufficient documentation

## 2011-07-09 LAB — RENAL FUNCTION PANEL
Albumin: 4.2 g/dL (ref 3.5–5.2)
CO2: 18 mEq/L — ABNORMAL LOW (ref 19–32)
Chloride: 107 mEq/L (ref 96–112)
Creatinine, Ser: 5.08 mg/dL — ABNORMAL HIGH (ref 0.50–1.35)
GFR calc Af Amer: 11 mL/min — ABNORMAL LOW (ref >90)
GFR calc non Af Amer: 10 mL/min — ABNORMAL LOW (ref >90)
Potassium: 5.1 mEq/L (ref 3.5–5.1)

## 2011-07-09 LAB — CBC
Hemoglobin: 14.3 g/dL (ref 13.0–17.0)
MCH: 33.6 pg (ref 26.0–34.0)
Platelets: 98 10*3/uL — ABNORMAL LOW (ref 150–400)
RBC: 4.25 MIL/uL (ref 4.22–5.81)
WBC: 2.5 10*3/uL — ABNORMAL LOW (ref 4.0–10.5)

## 2011-07-21 ENCOUNTER — Other Ambulatory Visit (HOSPITAL_COMMUNITY): Payer: Self-pay | Admitting: *Deleted

## 2011-07-23 ENCOUNTER — Encounter (HOSPITAL_COMMUNITY)
Admission: RE | Admit: 2011-07-23 | Discharge: 2011-07-23 | Disposition: A | Discharge status: Home / Self Care | Payer: Medicare Other | Source: Ambulatory Visit | Attending: Nephrology | Admitting: Nephrology

## 2011-07-23 LAB — POCT HEMOGLOBIN-HEMACUE: Hemoglobin: 13.6 g/dL (ref 13.0–17.0)

## 2011-07-23 MED ORDER — DARBEPOETIN ALFA-POLYSORBATE 500 MCG/ML IJ SOLN: 200.0000 ug | INTRAMUSCULAR | Status: DC

## 2011-08-03 ENCOUNTER — Other Ambulatory Visit (HOSPITAL_COMMUNITY): Payer: Self-pay | Admitting: *Deleted

## 2011-08-06 ENCOUNTER — Encounter (HOSPITAL_COMMUNITY)
Admission: RE | Admit: 2011-08-06 | Discharge: 2011-08-06 | Disposition: A | Discharge status: Home / Self Care | Payer: Medicare Other | Source: Ambulatory Visit | Attending: Nephrology | Admitting: Nephrology

## 2011-08-06 LAB — CBC
Hemoglobin: 12.6 g/dL — ABNORMAL LOW (ref 13.0–17.0)
MCH: 32.1 pg (ref 26.0–34.0)
Platelets: 82 10*3/uL — ABNORMAL LOW (ref 150–400)
RBC: 3.93 MIL/uL — ABNORMAL LOW (ref 4.22–5.81)
WBC: 4.3 10*3/uL (ref 4.0–10.5)

## 2011-08-06 LAB — RENAL FUNCTION PANEL
Albumin: 4.1 g/dL (ref 3.5–5.2)
CO2: 19 mEq/L (ref 19–32)
Chloride: 110 mEq/L (ref 96–112)
GFR calc Af Amer: 11 mL/min — ABNORMAL LOW (ref >90)
GFR calc non Af Amer: 9 mL/min — ABNORMAL LOW (ref >90)
Potassium: 5.6 mEq/L — ABNORMAL HIGH (ref 3.5–5.1)
Sodium: 141 mEq/L (ref 135–145)

## 2011-08-20 ENCOUNTER — Encounter (HOSPITAL_COMMUNITY)
Admission: RE | Admit: 2011-08-20 | Discharge: 2011-08-20 | Disposition: A | Discharge status: Home / Self Care | Payer: Medicare Other | Source: Ambulatory Visit | Attending: Nephrology | Admitting: Nephrology

## 2011-08-20 DIAGNOSIS — D631 Anemia in chronic kidney disease: Secondary | ICD-10-CM | POA: Insufficient documentation

## 2011-08-20 DIAGNOSIS — N185 Chronic kidney disease, stage 5: Secondary | ICD-10-CM | POA: Insufficient documentation

## 2011-08-20 DIAGNOSIS — I120 Hypertensive chronic kidney disease with stage 5 chronic kidney disease or end stage renal disease: Secondary | ICD-10-CM | POA: Insufficient documentation

## 2011-08-20 LAB — POCT HEMOGLOBIN-HEMACUE: Hemoglobin: 10.6 g/dL — ABNORMAL LOW (ref 13.0–17.0)

## 2011-08-20 MED ORDER — DARBEPOETIN ALFA-POLYSORBATE 200 MCG/0.4ML IJ SOLN
INTRAMUSCULAR | Status: AC
  Administered 2011-08-20: 200 ug via SUBCUTANEOUS
  Filled 2011-08-20: qty 0.4

## 2011-08-20 MED ORDER — DARBEPOETIN ALFA-POLYSORBATE 500 MCG/ML IJ SOLN
200.0000 ug | INTRAMUSCULAR | Status: DC
Start: 2011-08-20 — End: 2011-08-21
  Administered 2011-08-20: 200 ug via SUBCUTANEOUS

## 2011-09-03 ENCOUNTER — Encounter (HOSPITAL_COMMUNITY): Payer: Medicare Other

## 2011-09-09 ENCOUNTER — Inpatient Hospital Stay (HOSPITAL_COMMUNITY): Admission: RE | Admit: 2011-09-09 | Payer: Medicare Other | Source: Ambulatory Visit

## 2011-09-09 ENCOUNTER — Other Ambulatory Visit: Payer: Self-pay | Admitting: Nephrology

## 2011-09-09 ENCOUNTER — Ambulatory Visit
Admission: RE | Admit: 2011-09-09 | Discharge: 2011-09-09 | Disposition: A | Discharge status: Home / Self Care | Payer: Medicare Other | Source: Ambulatory Visit | Attending: Nephrology | Admitting: Nephrology

## 2011-09-09 DIAGNOSIS — R52 Pain, unspecified: Secondary | ICD-10-CM

## 2011-09-17 ENCOUNTER — Encounter (HOSPITAL_COMMUNITY)
Admission: RE | Admit: 2011-09-17 | Discharge: 2011-09-17 | Disposition: A | Discharge status: Home / Self Care | Payer: Medicare Other | Source: Ambulatory Visit | Attending: Nephrology | Admitting: Nephrology

## 2011-09-17 DIAGNOSIS — E119 Type 2 diabetes mellitus without complications: Secondary | ICD-10-CM | POA: Insufficient documentation

## 2011-09-17 DIAGNOSIS — N039 Chronic nephritic syndrome with unspecified morphologic changes: Secondary | ICD-10-CM | POA: Insufficient documentation

## 2011-09-17 DIAGNOSIS — I129 Hypertensive chronic kidney disease with stage 1 through stage 4 chronic kidney disease, or unspecified chronic kidney disease: Secondary | ICD-10-CM | POA: Insufficient documentation

## 2011-09-17 DIAGNOSIS — N189 Chronic kidney disease, unspecified: Secondary | ICD-10-CM | POA: Insufficient documentation

## 2011-09-17 DIAGNOSIS — D631 Anemia in chronic kidney disease: Secondary | ICD-10-CM | POA: Insufficient documentation

## 2011-09-17 LAB — CBC
HCT: 31.2 % — ABNORMAL LOW (ref 39.0–52.0)
Hemoglobin: 10.3 g/dL — ABNORMAL LOW (ref 13.0–17.0)
RBC: 3.19 MIL/uL — ABNORMAL LOW (ref 4.22–5.81)
RDW: 14.7 % (ref 11.5–15.5)
WBC: 3.4 10*3/uL — ABNORMAL LOW (ref 4.0–10.5)

## 2011-09-17 LAB — RENAL FUNCTION PANEL
Albumin: 4.1 g/dL (ref 3.5–5.2)
CO2: 18 mEq/L — ABNORMAL LOW (ref 19–32)
Chloride: 107 mEq/L (ref 96–112)
Creatinine, Ser: 5.85 mg/dL — ABNORMAL HIGH (ref 0.50–1.35)
GFR calc Af Amer: 10 mL/min — ABNORMAL LOW (ref >90)
GFR calc non Af Amer: 8 mL/min — ABNORMAL LOW (ref >90)
Sodium: 137 mEq/L (ref 135–145)

## 2011-09-17 MED ORDER — DARBEPOETIN ALFA-POLYSORBATE 500 MCG/ML IJ SOLN
200.0000 ug | INTRAMUSCULAR | Status: DC
  Administered 2011-09-17: 200 ug via SUBCUTANEOUS
  Filled 2011-09-17: qty 1

## 2011-09-17 MED ORDER — DARBEPOETIN ALFA-POLYSORBATE 200 MCG/0.4ML IJ SOLN
INTRAMUSCULAR | Status: AC
  Filled 2011-09-17: qty 0.4

## 2011-10-01 ENCOUNTER — Encounter (HOSPITAL_COMMUNITY)
Admission: RE | Admit: 2011-10-01 | Discharge: 2011-10-01 | Disposition: A | Discharge status: Home / Self Care | Payer: Medicare Other | Source: Ambulatory Visit | Attending: Nephrology | Admitting: Nephrology

## 2011-10-01 MED ORDER — DARBEPOETIN ALFA-POLYSORBATE 200 MCG/0.4ML IJ SOLN
INTRAMUSCULAR | Status: AC
  Filled 2011-10-01: qty 0.4

## 2011-10-01 MED ORDER — DARBEPOETIN ALFA-POLYSORBATE 500 MCG/ML IJ SOLN
200.0000 ug | INTRAMUSCULAR | Status: DC
  Administered 2011-10-01: 200 ug via SUBCUTANEOUS
  Filled 2011-10-01: qty 1

## 2011-10-01 MED ORDER — EPOETIN ALFA 20000 UNIT/ML IJ SOLN
INTRAMUSCULAR | Status: AC
  Filled 2011-10-01: qty 1

## 2011-10-04 MED FILL — Darbepoetin Alfa-Polysorbate 80 Soln Inj 200 MCG/0.4ML: INTRAMUSCULAR | Qty: 0.4 | Status: AC

## 2011-10-15 ENCOUNTER — Encounter (HOSPITAL_COMMUNITY)
Admission: RE | Admit: 2011-10-15 | Discharge: 2011-10-15 | Disposition: A | Discharge status: Home / Self Care | Payer: Medicare Other | Source: Ambulatory Visit | Attending: Nephrology | Admitting: Nephrology

## 2011-10-15 DIAGNOSIS — N039 Chronic nephritic syndrome with unspecified morphologic changes: Secondary | ICD-10-CM | POA: Insufficient documentation

## 2011-10-15 DIAGNOSIS — D631 Anemia in chronic kidney disease: Secondary | ICD-10-CM | POA: Insufficient documentation

## 2011-10-15 DIAGNOSIS — N189 Chronic kidney disease, unspecified: Secondary | ICD-10-CM | POA: Insufficient documentation

## 2011-10-15 DIAGNOSIS — I129 Hypertensive chronic kidney disease with stage 1 through stage 4 chronic kidney disease, or unspecified chronic kidney disease: Secondary | ICD-10-CM | POA: Insufficient documentation

## 2011-10-15 DIAGNOSIS — E119 Type 2 diabetes mellitus without complications: Secondary | ICD-10-CM | POA: Insufficient documentation

## 2011-10-15 LAB — RENAL FUNCTION PANEL
Chloride: 109 mEq/L (ref 96–112)
GFR calc Af Amer: 10 mL/min — ABNORMAL LOW (ref >90)
Glucose, Bld: 111 mg/dL — ABNORMAL HIGH (ref 70–99)
Phosphorus: 6.3 mg/dL — ABNORMAL HIGH (ref 2.3–4.6)
Potassium: 5.7 mEq/L — ABNORMAL HIGH (ref 3.5–5.1)
Sodium: 139 mEq/L (ref 135–145)

## 2011-10-15 LAB — POCT HEMOGLOBIN-HEMACUE: Hemoglobin: 12.4 g/dL — ABNORMAL LOW (ref 13.0–17.0)

## 2011-10-15 MED ORDER — DARBEPOETIN ALFA-POLYSORBATE 500 MCG/ML IJ SOLN: 200.0000 ug | INTRAMUSCULAR | Status: DC

## 2011-10-29 ENCOUNTER — Encounter (HOSPITAL_COMMUNITY)
Admission: RE | Admit: 2011-10-29 | Discharge: 2011-10-29 | Disposition: A | Discharge status: Home / Self Care | Payer: Medicare Other | Source: Ambulatory Visit | Attending: Nephrology | Admitting: Nephrology

## 2011-10-29 LAB — CBC
Hemoglobin: 13.4 g/dL (ref 13.0–17.0)
MCHC: 32.7 g/dL (ref 30.0–36.0)
RDW: 13.9 % (ref 11.5–15.5)

## 2011-10-29 MED ORDER — DARBEPOETIN ALFA-POLYSORBATE 500 MCG/ML IJ SOLN: 200.0000 ug | INTRAMUSCULAR | Status: DC

## 2011-11-10 ENCOUNTER — Other Ambulatory Visit (HOSPITAL_COMMUNITY): Payer: Self-pay | Admitting: *Deleted

## 2011-11-12 ENCOUNTER — Encounter (HOSPITAL_COMMUNITY)
Admission: RE | Admit: 2011-11-12 | Discharge: 2011-11-12 | Disposition: A | Discharge status: Home / Self Care | Payer: Medicare Other | Source: Ambulatory Visit | Attending: Nephrology | Admitting: Nephrology

## 2011-11-12 DIAGNOSIS — N039 Chronic nephritic syndrome with unspecified morphologic changes: Secondary | ICD-10-CM | POA: Insufficient documentation

## 2011-11-12 DIAGNOSIS — N189 Chronic kidney disease, unspecified: Secondary | ICD-10-CM | POA: Insufficient documentation

## 2011-11-12 DIAGNOSIS — I129 Hypertensive chronic kidney disease with stage 1 through stage 4 chronic kidney disease, or unspecified chronic kidney disease: Secondary | ICD-10-CM | POA: Insufficient documentation

## 2011-11-12 DIAGNOSIS — D631 Anemia in chronic kidney disease: Secondary | ICD-10-CM | POA: Insufficient documentation

## 2011-11-12 DIAGNOSIS — E119 Type 2 diabetes mellitus without complications: Secondary | ICD-10-CM | POA: Insufficient documentation

## 2011-11-12 LAB — RENAL FUNCTION PANEL
CO2: 20 mEq/L (ref 19–32)
Chloride: 106 mEq/L (ref 96–112)
GFR calc Af Amer: 8 mL/min — ABNORMAL LOW (ref >90)
GFR calc non Af Amer: 7 mL/min — ABNORMAL LOW (ref >90)
Sodium: 138 mEq/L (ref 135–145)

## 2011-11-12 LAB — POCT HEMOGLOBIN-HEMACUE: Hemoglobin: 11.9 g/dL — ABNORMAL LOW (ref 13.0–17.0)

## 2011-11-12 MED ORDER — DARBEPOETIN ALFA-POLYSORBATE 500 MCG/ML IJ SOLN
200.0000 ug | INTRAMUSCULAR | Status: DC
  Administered 2011-11-12: 200 ug via SUBCUTANEOUS

## 2011-11-12 MED ORDER — DARBEPOETIN ALFA-POLYSORBATE 200 MCG/0.4ML IJ SOLN
INTRAMUSCULAR | Status: AC
  Filled 2011-11-12: qty 0.4

## 2011-11-15 MED FILL — Darbepoetin Alfa-Polysorbate 80 Soln Inj 200 MCG/0.4ML: INTRAMUSCULAR | Qty: 0.4 | Status: AC

## 2011-11-26 ENCOUNTER — Encounter (HOSPITAL_COMMUNITY)
Admission: RE | Admit: 2011-11-26 | Discharge: 2011-11-26 | Disposition: A | Discharge status: Home / Self Care | Payer: Medicare Other | Source: Ambulatory Visit | Attending: Nephrology | Admitting: Nephrology

## 2011-11-26 LAB — CBC
Platelets: 112 10*3/uL — ABNORMAL LOW (ref 150–400)
RBC: 3.63 MIL/uL — ABNORMAL LOW (ref 4.22–5.81)
WBC: 3.1 10*3/uL — ABNORMAL LOW (ref 4.0–10.5)

## 2011-11-26 MED ORDER — DARBEPOETIN ALFA-POLYSORBATE 200 MCG/0.4ML IJ SOLN
INTRAMUSCULAR | Status: AC
  Filled 2011-11-26: qty 0.4

## 2011-11-26 MED ORDER — DARBEPOETIN ALFA-POLYSORBATE 500 MCG/ML IJ SOLN
200.0000 ug | INTRAMUSCULAR | Status: DC
  Administered 2011-11-26: 200 ug via SUBCUTANEOUS
  Filled 2011-11-26: qty 1

## 2011-11-29 MED FILL — Darbepoetin Alfa-Polysorbate 80 Soln Inj 200 MCG/0.4ML: INTRAMUSCULAR | Qty: 0.4 | Status: AC

## 2011-12-10 ENCOUNTER — Encounter (HOSPITAL_COMMUNITY)
Admission: RE | Admit: 2011-12-10 | Discharge: 2011-12-10 | Disposition: A | Discharge status: Home / Self Care | Payer: Medicare Other | Source: Ambulatory Visit | Attending: Nephrology | Admitting: Nephrology

## 2011-12-10 DIAGNOSIS — E119 Type 2 diabetes mellitus without complications: Secondary | ICD-10-CM | POA: Insufficient documentation

## 2011-12-10 DIAGNOSIS — I129 Hypertensive chronic kidney disease with stage 1 through stage 4 chronic kidney disease, or unspecified chronic kidney disease: Secondary | ICD-10-CM | POA: Insufficient documentation

## 2011-12-10 DIAGNOSIS — D631 Anemia in chronic kidney disease: Secondary | ICD-10-CM | POA: Insufficient documentation

## 2011-12-10 DIAGNOSIS — N189 Chronic kidney disease, unspecified: Secondary | ICD-10-CM | POA: Insufficient documentation

## 2011-12-10 DIAGNOSIS — N039 Chronic nephritic syndrome with unspecified morphologic changes: Secondary | ICD-10-CM | POA: Insufficient documentation

## 2011-12-10 MED ORDER — DARBEPOETIN ALFA-POLYSORBATE 200 MCG/0.4ML IJ SOLN: 200.0000 ug | INTRAMUSCULAR | Status: DC

## 2011-12-13 LAB — POCT HEMOGLOBIN-HEMACUE: Hemoglobin: 12.7 g/dL — ABNORMAL LOW (ref 13.0–17.0)

## 2011-12-24 ENCOUNTER — Encounter (HOSPITAL_COMMUNITY)
Admission: RE | Admit: 2011-12-24 | Discharge: 2011-12-24 | Disposition: A | Discharge status: Home / Self Care | Payer: Medicare Other | Source: Ambulatory Visit | Attending: Nephrology | Admitting: Nephrology

## 2011-12-24 MED ORDER — DARBEPOETIN ALFA-POLYSORBATE 200 MCG/0.4ML IJ SOLN
INTRAMUSCULAR | Status: AC
  Filled 2011-12-24: qty 0.4

## 2011-12-24 MED ORDER — DARBEPOETIN ALFA-POLYSORBATE 200 MCG/0.4ML IJ SOLN
200.0000 ug | INTRAMUSCULAR | Status: DC
  Administered 2011-12-24: 200 ug via INTRAVENOUS

## 2011-12-27 LAB — POCT HEMOGLOBIN-HEMACUE: Hemoglobin: 11.6 g/dL — ABNORMAL LOW (ref 13.0–17.0)

## 2012-01-07 ENCOUNTER — Encounter (HOSPITAL_COMMUNITY)
Admission: RE | Admit: 2012-01-07 | Discharge: 2012-01-07 | Disposition: A | Discharge status: Home / Self Care | Payer: Medicare Other | Source: Ambulatory Visit | Attending: Nephrology | Admitting: Nephrology

## 2012-01-07 DIAGNOSIS — I129 Hypertensive chronic kidney disease with stage 1 through stage 4 chronic kidney disease, or unspecified chronic kidney disease: Secondary | ICD-10-CM | POA: Insufficient documentation

## 2012-01-07 DIAGNOSIS — D631 Anemia in chronic kidney disease: Secondary | ICD-10-CM | POA: Insufficient documentation

## 2012-01-07 DIAGNOSIS — N189 Chronic kidney disease, unspecified: Secondary | ICD-10-CM | POA: Insufficient documentation

## 2012-01-07 DIAGNOSIS — E119 Type 2 diabetes mellitus without complications: Secondary | ICD-10-CM | POA: Insufficient documentation

## 2012-01-07 MED ORDER — DARBEPOETIN ALFA-POLYSORBATE 200 MCG/0.4ML IJ SOLN: 200.0000 ug | INTRAMUSCULAR | Status: DC

## 2012-01-21 ENCOUNTER — Encounter (HOSPITAL_COMMUNITY)
Admission: RE | Admit: 2012-01-21 | Discharge: 2012-01-21 | Disposition: A | Discharge status: Home / Self Care | Payer: Medicare Other | Source: Ambulatory Visit | Attending: Nephrology | Admitting: Nephrology

## 2012-01-21 LAB — RENAL FUNCTION PANEL
Albumin: 4.2 g/dL (ref 3.5–5.2)
BUN: 101 mg/dL — ABNORMAL HIGH (ref 6–23)
Chloride: 106 mEq/L (ref 96–112)
Creatinine, Ser: 6.31 mg/dL — ABNORMAL HIGH (ref 0.50–1.35)
Glucose, Bld: 127 mg/dL — ABNORMAL HIGH (ref 70–99)

## 2012-01-21 LAB — CBC
HCT: 34.8 % — ABNORMAL LOW (ref 39.0–52.0)
Hemoglobin: 11.6 g/dL — ABNORMAL LOW (ref 13.0–17.0)
MCH: 32.6 pg (ref 26.0–34.0)
MCV: 97.8 fL (ref 78.0–100.0)
Platelets: 108 10*3/uL — ABNORMAL LOW (ref 150–400)
RBC: 3.56 MIL/uL — ABNORMAL LOW (ref 4.22–5.81)

## 2012-01-21 MED ORDER — DARBEPOETIN ALFA-POLYSORBATE 200 MCG/0.4ML IJ SOLN
200.0000 ug | INTRAMUSCULAR | Status: DC
  Administered 2012-01-21: 200 ug via INTRAVENOUS

## 2012-01-21 MED ORDER — DARBEPOETIN ALFA-POLYSORBATE 200 MCG/0.4ML IJ SOLN
INTRAMUSCULAR | Status: AC
  Administered 2012-01-21: 200 ug via INTRAVENOUS
  Filled 2012-01-21: qty 0.4

## 2012-02-04 ENCOUNTER — Encounter (HOSPITAL_COMMUNITY)
Admission: RE | Admit: 2012-02-04 | Discharge: 2012-02-04 | Disposition: A | Discharge status: Home / Self Care | Payer: Medicare Other | Source: Ambulatory Visit | Attending: Nephrology | Admitting: Nephrology

## 2012-02-04 MED ORDER — DARBEPOETIN ALFA-POLYSORBATE 200 MCG/0.4ML IJ SOLN: 200.0000 ug | INTRAMUSCULAR | Status: DC

## 2012-02-04 NOTE — Progress Notes (Signed)
Requested orders x 2 today from office; pt here for injection, no orders, office again called

## 2012-02-15 ENCOUNTER — Other Ambulatory Visit (HOSPITAL_COMMUNITY): Payer: Self-pay | Admitting: *Deleted

## 2012-02-18 ENCOUNTER — Encounter (HOSPITAL_COMMUNITY)
Admission: RE | Admit: 2012-02-18 | Discharge: 2012-02-18 | Disposition: A | Discharge status: Home / Self Care | Payer: Medicare Other | Source: Ambulatory Visit | Attending: Nephrology | Admitting: Nephrology

## 2012-02-18 DIAGNOSIS — N189 Chronic kidney disease, unspecified: Secondary | ICD-10-CM | POA: Insufficient documentation

## 2012-02-18 DIAGNOSIS — I129 Hypertensive chronic kidney disease with stage 1 through stage 4 chronic kidney disease, or unspecified chronic kidney disease: Secondary | ICD-10-CM | POA: Insufficient documentation

## 2012-02-18 DIAGNOSIS — N039 Chronic nephritic syndrome with unspecified morphologic changes: Secondary | ICD-10-CM | POA: Insufficient documentation

## 2012-02-18 DIAGNOSIS — D631 Anemia in chronic kidney disease: Secondary | ICD-10-CM | POA: Insufficient documentation

## 2012-02-18 DIAGNOSIS — E119 Type 2 diabetes mellitus without complications: Secondary | ICD-10-CM | POA: Insufficient documentation

## 2012-02-18 LAB — RENAL FUNCTION PANEL
BUN: 106 mg/dL — ABNORMAL HIGH (ref 6–23)
CO2: 21 mEq/L (ref 19–32)
Chloride: 106 mEq/L (ref 96–112)
Creatinine, Ser: 6.92 mg/dL — ABNORMAL HIGH (ref 0.50–1.35)
Glucose, Bld: 131 mg/dL — ABNORMAL HIGH (ref 70–99)
Potassium: 5.5 mEq/L — ABNORMAL HIGH (ref 3.5–5.1)

## 2012-02-18 LAB — CBC
MCH: 31.9 pg (ref 26.0–34.0)
MCHC: 32.2 g/dL (ref 30.0–36.0)
MCV: 98.9 fL (ref 78.0–100.0)
Platelets: 102 10*3/uL — ABNORMAL LOW (ref 150–400)
RBC: 3.7 MIL/uL — ABNORMAL LOW (ref 4.22–5.81)
RDW: 13.7 % (ref 11.5–15.5)

## 2012-02-18 MED ORDER — DARBEPOETIN ALFA-POLYSORBATE 200 MCG/0.4ML IJ SOLN
200.0000 ug | INTRAMUSCULAR | Status: DC
  Administered 2012-02-18: 200 ug via INTRAVENOUS
  Filled 2012-02-18: qty 0.4

## 2012-03-03 ENCOUNTER — Encounter (HOSPITAL_COMMUNITY)
Admission: RE | Admit: 2012-03-03 | Discharge: 2012-03-03 | Disposition: A | Discharge status: Home / Self Care | Payer: Medicare Other | Source: Ambulatory Visit | Attending: Nephrology | Admitting: Nephrology

## 2012-03-03 LAB — POCT HEMOGLOBIN-HEMACUE: Hemoglobin: 12.6 g/dL — ABNORMAL LOW (ref 13.0–17.0)

## 2012-03-03 MED ORDER — DARBEPOETIN ALFA-POLYSORBATE 200 MCG/0.4ML IJ SOLN: 200.0000 ug | INTRAMUSCULAR | Status: DC

## 2012-03-17 ENCOUNTER — Encounter (HOSPITAL_COMMUNITY)
Admission: RE | Admit: 2012-03-17 | Discharge: 2012-03-17 | Disposition: A | Discharge status: Home / Self Care | Payer: Medicare Other | Source: Ambulatory Visit | Attending: Nephrology | Admitting: Nephrology

## 2012-03-17 DIAGNOSIS — N189 Chronic kidney disease, unspecified: Secondary | ICD-10-CM | POA: Insufficient documentation

## 2012-03-17 DIAGNOSIS — E119 Type 2 diabetes mellitus without complications: Secondary | ICD-10-CM | POA: Insufficient documentation

## 2012-03-17 DIAGNOSIS — N039 Chronic nephritic syndrome with unspecified morphologic changes: Secondary | ICD-10-CM | POA: Insufficient documentation

## 2012-03-17 DIAGNOSIS — D631 Anemia in chronic kidney disease: Secondary | ICD-10-CM | POA: Insufficient documentation

## 2012-03-17 DIAGNOSIS — I129 Hypertensive chronic kidney disease with stage 1 through stage 4 chronic kidney disease, or unspecified chronic kidney disease: Secondary | ICD-10-CM | POA: Insufficient documentation

## 2012-03-17 LAB — RENAL FUNCTION PANEL
Albumin: 4.5 g/dL (ref 3.5–5.2)
BUN: 111 mg/dL — ABNORMAL HIGH (ref 6–23)
Calcium: 7.7 mg/dL — ABNORMAL LOW (ref 8.4–10.5)
Creatinine, Ser: 6.73 mg/dL — ABNORMAL HIGH (ref 0.50–1.35)
GFR calc non Af Amer: 7 mL/min — ABNORMAL LOW (ref >90)
Phosphorus: 6.3 mg/dL — ABNORMAL HIGH (ref 2.3–4.6)

## 2012-03-17 LAB — CBC
MCH: 33.2 pg (ref 26.0–34.0)
MCHC: 33.7 g/dL (ref 30.0–36.0)
MCV: 98.6 fL (ref 78.0–100.0)
Platelets: 99 10*3/uL — ABNORMAL LOW (ref 150–400)
RBC: 3.58 MIL/uL — ABNORMAL LOW (ref 4.22–5.81)
RDW: 14.2 % (ref 11.5–15.5)

## 2012-03-17 MED ORDER — DARBEPOETIN ALFA-POLYSORBATE 200 MCG/0.4ML IJ SOLN
200.0000 ug | INTRAMUSCULAR | Status: DC
  Administered 2012-03-17: 200 ug via INTRAVENOUS
  Filled 2012-03-17: qty 0.4

## 2012-04-03 ENCOUNTER — Encounter (HOSPITAL_COMMUNITY)
Admission: RE | Admit: 2012-04-03 | Discharge: 2012-04-03 | Disposition: A | Discharge status: Home / Self Care | Payer: Medicare Other | Source: Ambulatory Visit | Attending: Nephrology | Admitting: Nephrology

## 2012-04-03 LAB — POCT HEMOGLOBIN-HEMACUE: Hemoglobin: 11.8 g/dL — ABNORMAL LOW (ref 13.0–17.0)

## 2012-04-03 MED ORDER — DARBEPOETIN ALFA-POLYSORBATE 200 MCG/0.4ML IJ SOLN
INTRAMUSCULAR | Status: AC
  Filled 2012-04-03: qty 0.4

## 2012-04-03 MED ORDER — DARBEPOETIN ALFA-POLYSORBATE 200 MCG/0.4ML IJ SOLN
200.0000 ug | INTRAMUSCULAR | Status: DC
  Administered 2012-04-03: 200 ug via INTRAVENOUS

## 2012-04-17 ENCOUNTER — Encounter (HOSPITAL_COMMUNITY)
Admission: RE | Admit: 2012-04-17 | Discharge: 2012-04-17 | Disposition: A | Discharge status: Home / Self Care | Payer: Medicare Other | Source: Ambulatory Visit | Attending: Nephrology | Admitting: Nephrology

## 2012-04-17 DIAGNOSIS — N189 Chronic kidney disease, unspecified: Secondary | ICD-10-CM | POA: Insufficient documentation

## 2012-04-17 DIAGNOSIS — E119 Type 2 diabetes mellitus without complications: Secondary | ICD-10-CM | POA: Insufficient documentation

## 2012-04-17 DIAGNOSIS — I129 Hypertensive chronic kidney disease with stage 1 through stage 4 chronic kidney disease, or unspecified chronic kidney disease: Secondary | ICD-10-CM | POA: Insufficient documentation

## 2012-04-17 DIAGNOSIS — D631 Anemia in chronic kidney disease: Secondary | ICD-10-CM | POA: Insufficient documentation

## 2012-04-17 LAB — CBC
MCV: 99.7 fL (ref 78.0–100.0)
Platelets: 129 10*3/uL — ABNORMAL LOW (ref 150–400)
RBC: 3.9 MIL/uL — ABNORMAL LOW (ref 4.22–5.81)
WBC: 4.5 10*3/uL (ref 4.0–10.5)

## 2012-04-17 LAB — RENAL FUNCTION PANEL
Albumin: 4.2 g/dL (ref 3.5–5.2)
BUN: 101 mg/dL — ABNORMAL HIGH (ref 6–23)
Calcium: 7.6 mg/dL — ABNORMAL LOW (ref 8.4–10.5)
Chloride: 109 mEq/L (ref 96–112)
Creatinine, Ser: 6.84 mg/dL — ABNORMAL HIGH (ref 0.50–1.35)

## 2012-04-17 MED ORDER — DARBEPOETIN ALFA-POLYSORBATE 200 MCG/0.4ML IJ SOLN: 200.0000 ug | INTRAMUSCULAR | Status: DC

## 2012-05-01 ENCOUNTER — Encounter (HOSPITAL_COMMUNITY)
Admission: RE | Admit: 2012-05-01 | Discharge: 2012-05-01 | Disposition: A | Discharge status: Home / Self Care | Payer: Medicare Other | Source: Ambulatory Visit | Attending: Nephrology | Admitting: Nephrology

## 2012-05-01 ENCOUNTER — Other Ambulatory Visit (HOSPITAL_COMMUNITY): Payer: Self-pay | Admitting: *Deleted

## 2012-05-01 LAB — POCT HEMOGLOBIN-HEMACUE: Hemoglobin: 12.9 g/dL — ABNORMAL LOW (ref 13.0–17.0)

## 2012-05-01 MED ORDER — DARBEPOETIN ALFA-POLYSORBATE 200 MCG/0.4ML IJ SOLN: 200.0000 ug | INTRAMUSCULAR | Status: DC

## 2012-05-15 ENCOUNTER — Encounter (HOSPITAL_COMMUNITY)
Admission: RE | Admit: 2012-05-15 | Discharge: 2012-05-15 | Disposition: A | Discharge status: Home / Self Care | Payer: Medicare Other | Source: Ambulatory Visit | Attending: Nephrology | Admitting: Nephrology

## 2012-05-15 DIAGNOSIS — D631 Anemia in chronic kidney disease: Secondary | ICD-10-CM | POA: Insufficient documentation

## 2012-05-15 DIAGNOSIS — I129 Hypertensive chronic kidney disease with stage 1 through stage 4 chronic kidney disease, or unspecified chronic kidney disease: Secondary | ICD-10-CM | POA: Insufficient documentation

## 2012-05-15 DIAGNOSIS — E119 Type 2 diabetes mellitus without complications: Secondary | ICD-10-CM | POA: Insufficient documentation

## 2012-05-15 DIAGNOSIS — N189 Chronic kidney disease, unspecified: Secondary | ICD-10-CM | POA: Insufficient documentation

## 2012-05-15 LAB — RENAL FUNCTION PANEL
Albumin: 4.1 g/dL (ref 3.5–5.2)
BUN: 119 mg/dL — ABNORMAL HIGH (ref 6–23)
Chloride: 104 mEq/L (ref 96–112)
GFR calc Af Amer: 8 mL/min — ABNORMAL LOW (ref >90)
GFR calc non Af Amer: 7 mL/min — ABNORMAL LOW (ref >90)
Potassium: 5.3 mEq/L — ABNORMAL HIGH (ref 3.5–5.1)
Sodium: 136 mEq/L (ref 135–145)

## 2012-05-15 LAB — CBC
Platelets: 85 10*3/uL — ABNORMAL LOW (ref 150–400)
RBC: 3.2 MIL/uL — ABNORMAL LOW (ref 4.22–5.81)
WBC: 3.5 10*3/uL — ABNORMAL LOW (ref 4.0–10.5)

## 2012-05-15 MED ORDER — DARBEPOETIN ALFA-POLYSORBATE 200 MCG/0.4ML IJ SOLN
200.0000 ug | INTRAMUSCULAR | Status: DC
  Administered 2012-05-15: 200 ug via INTRAVENOUS
  Filled 2012-05-15: qty 0.4

## 2012-05-29 ENCOUNTER — Encounter (HOSPITAL_COMMUNITY)
Admission: RE | Admit: 2012-05-29 | Discharge: 2012-05-29 | Disposition: A | Discharge status: Home / Self Care | Payer: Medicare Other | Source: Ambulatory Visit | Attending: Nephrology | Admitting: Nephrology

## 2012-05-29 LAB — POCT HEMOGLOBIN-HEMACUE: Hemoglobin: 10.1 g/dL — ABNORMAL LOW (ref 13.0–17.0)

## 2012-05-29 MED ORDER — DARBEPOETIN ALFA-POLYSORBATE 200 MCG/0.4ML IJ SOLN
200.0000 ug | INTRAMUSCULAR | Status: DC
  Administered 2012-05-29: 200 ug via INTRAVENOUS
  Filled 2012-05-29: qty 0.4

## 2012-06-12 ENCOUNTER — Encounter (HOSPITAL_COMMUNITY)
Admission: RE | Admit: 2012-06-12 | Discharge: 2012-06-12 | Disposition: A | Discharge status: Home / Self Care | Payer: Medicare Other | Source: Ambulatory Visit | Attending: Nephrology | Admitting: Nephrology

## 2012-06-12 DIAGNOSIS — E119 Type 2 diabetes mellitus without complications: Secondary | ICD-10-CM | POA: Insufficient documentation

## 2012-06-12 DIAGNOSIS — I129 Hypertensive chronic kidney disease with stage 1 through stage 4 chronic kidney disease, or unspecified chronic kidney disease: Secondary | ICD-10-CM | POA: Insufficient documentation

## 2012-06-12 DIAGNOSIS — D631 Anemia in chronic kidney disease: Secondary | ICD-10-CM | POA: Insufficient documentation

## 2012-06-12 DIAGNOSIS — N189 Chronic kidney disease, unspecified: Secondary | ICD-10-CM | POA: Insufficient documentation

## 2012-06-12 LAB — RENAL FUNCTION PANEL
Albumin: 4.4 g/dL (ref 3.5–5.2)
Chloride: 108 mEq/L (ref 96–112)
GFR calc non Af Amer: 7 mL/min — ABNORMAL LOW (ref >90)
Phosphorus: 6.4 mg/dL — ABNORMAL HIGH (ref 2.3–4.6)
Potassium: 5.4 mEq/L — ABNORMAL HIGH (ref 3.5–5.1)
Sodium: 140 mEq/L (ref 135–145)

## 2012-06-12 LAB — CBC
MCV: 102.5 fL — ABNORMAL HIGH (ref 78.0–100.0)
Platelets: 122 10*3/uL — ABNORMAL LOW (ref 150–400)
RBC: 3.53 MIL/uL — ABNORMAL LOW (ref 4.22–5.81)
RDW: 16.4 % — ABNORMAL HIGH (ref 11.5–15.5)
WBC: 3.1 10*3/uL — ABNORMAL LOW (ref 4.0–10.5)

## 2012-06-12 MED ORDER — DARBEPOETIN ALFA-POLYSORBATE 200 MCG/0.4ML IJ SOLN
INTRAMUSCULAR | Status: AC
  Administered 2012-06-12: 200 ug via SUBCUTANEOUS
  Filled 2012-06-12: qty 0.4

## 2012-06-12 MED ORDER — DARBEPOETIN ALFA-POLYSORBATE 200 MCG/0.4ML IJ SOLN: 200.0000 ug | INTRAMUSCULAR | Status: DC

## 2012-06-26 ENCOUNTER — Encounter (HOSPITAL_COMMUNITY)
Admission: RE | Admit: 2012-06-26 | Discharge: 2012-06-26 | Disposition: A | Discharge status: Home / Self Care | Payer: Medicare Other | Source: Ambulatory Visit | Attending: Nephrology | Admitting: Nephrology

## 2012-06-26 LAB — POCT HEMOGLOBIN-HEMACUE: Hemoglobin: 12.7 g/dL — ABNORMAL LOW (ref 13.0–17.0)

## 2012-06-26 MED ORDER — DARBEPOETIN ALFA-POLYSORBATE 200 MCG/0.4ML IJ SOLN: 200.0000 ug | INTRAMUSCULAR | Status: DC

## 2012-07-10 ENCOUNTER — Encounter (HOSPITAL_COMMUNITY)
Admission: RE | Admit: 2012-07-10 | Discharge: 2012-07-10 | Disposition: A | Discharge status: Home / Self Care | Payer: Medicare Other | Source: Ambulatory Visit | Attending: Nephrology | Admitting: Nephrology

## 2012-07-10 DIAGNOSIS — N189 Chronic kidney disease, unspecified: Secondary | ICD-10-CM | POA: Insufficient documentation

## 2012-07-10 DIAGNOSIS — I129 Hypertensive chronic kidney disease with stage 1 through stage 4 chronic kidney disease, or unspecified chronic kidney disease: Secondary | ICD-10-CM | POA: Insufficient documentation

## 2012-07-10 DIAGNOSIS — D631 Anemia in chronic kidney disease: Secondary | ICD-10-CM | POA: Insufficient documentation

## 2012-07-10 DIAGNOSIS — E119 Type 2 diabetes mellitus without complications: Secondary | ICD-10-CM | POA: Insufficient documentation

## 2012-07-10 LAB — CBC
MCV: 100 fL (ref 78.0–100.0)
Platelets: 105 10*3/uL — ABNORMAL LOW (ref 150–400)
RBC: 3.9 MIL/uL — ABNORMAL LOW (ref 4.22–5.81)
RDW: 13.9 % (ref 11.5–15.5)
WBC: 3.7 10*3/uL — ABNORMAL LOW (ref 4.0–10.5)

## 2012-07-10 LAB — RENAL FUNCTION PANEL
Albumin: 4.6 g/dL (ref 3.5–5.2)
BUN: 107 mg/dL — ABNORMAL HIGH (ref 6–23)
Chloride: 107 mEq/L (ref 96–112)
GFR calc Af Amer: 8 mL/min — ABNORMAL LOW (ref >90)
GFR calc non Af Amer: 7 mL/min — ABNORMAL LOW (ref >90)
Phosphorus: 7.4 mg/dL — ABNORMAL HIGH (ref 2.3–4.6)
Potassium: 5.3 mEq/L — ABNORMAL HIGH (ref 3.5–5.1)
Sodium: 139 mEq/L (ref 135–145)

## 2012-07-10 MED ORDER — DARBEPOETIN ALFA-POLYSORBATE 200 MCG/0.4ML IJ SOLN: 200.0000 ug | INTRAMUSCULAR | Status: DC

## 2012-07-24 ENCOUNTER — Encounter (HOSPITAL_COMMUNITY)
Admission: RE | Admit: 2012-07-24 | Discharge: 2012-07-24 | Disposition: A | Discharge status: Home / Self Care | Payer: Medicare Other | Source: Ambulatory Visit | Attending: Nephrology | Admitting: Nephrology

## 2012-07-24 LAB — POCT HEMOGLOBIN-HEMACUE: Hemoglobin: 10.6 g/dL — ABNORMAL LOW (ref 13.0–17.0)

## 2012-07-24 MED ORDER — DARBEPOETIN ALFA-POLYSORBATE 200 MCG/0.4ML IJ SOLN
200.0000 ug | INTRAMUSCULAR | Status: DC
  Administered 2012-07-24: 200 ug via INTRAVENOUS

## 2012-08-07 ENCOUNTER — Encounter (HOSPITAL_COMMUNITY)
Admission: RE | Admit: 2012-08-07 | Discharge: 2012-08-07 | Disposition: A | Discharge status: Home / Self Care | Payer: Medicare Other | Source: Ambulatory Visit | Attending: Nephrology | Admitting: Nephrology

## 2012-08-07 DIAGNOSIS — I129 Hypertensive chronic kidney disease with stage 1 through stage 4 chronic kidney disease, or unspecified chronic kidney disease: Secondary | ICD-10-CM | POA: Insufficient documentation

## 2012-08-07 DIAGNOSIS — D631 Anemia in chronic kidney disease: Secondary | ICD-10-CM | POA: Insufficient documentation

## 2012-08-07 DIAGNOSIS — E119 Type 2 diabetes mellitus without complications: Secondary | ICD-10-CM | POA: Insufficient documentation

## 2012-08-07 DIAGNOSIS — N189 Chronic kidney disease, unspecified: Secondary | ICD-10-CM | POA: Insufficient documentation

## 2012-08-07 LAB — RENAL FUNCTION PANEL
CO2: 16 mEq/L — ABNORMAL LOW (ref 19–32)
Calcium: 7 mg/dL — ABNORMAL LOW (ref 8.4–10.5)
Chloride: 111 mEq/L (ref 96–112)
GFR calc Af Amer: 7 mL/min — ABNORMAL LOW (ref >90)
GFR calc non Af Amer: 6 mL/min — ABNORMAL LOW (ref >90)
Potassium: 5.4 mEq/L — ABNORMAL HIGH (ref 3.5–5.1)
Sodium: 144 mEq/L (ref 135–145)

## 2012-08-07 LAB — CBC
Hemoglobin: 10.1 g/dL — ABNORMAL LOW (ref 13.0–17.0)
MCHC: 32.8 g/dL (ref 30.0–36.0)
RBC: 3.06 MIL/uL — ABNORMAL LOW (ref 4.22–5.81)

## 2012-08-07 MED ORDER — DARBEPOETIN ALFA-POLYSORBATE 200 MCG/0.4ML IJ SOLN
INTRAMUSCULAR | Status: AC
  Administered 2012-08-07: 200 ug via SUBCUTANEOUS
  Filled 2012-08-07: qty 0.4

## 2012-08-07 MED ORDER — DARBEPOETIN ALFA-POLYSORBATE 200 MCG/0.4ML IJ SOLN: 200.0000 ug | INTRAMUSCULAR | Status: DC

## 2012-08-21 ENCOUNTER — Encounter (HOSPITAL_COMMUNITY)
Admission: RE | Admit: 2012-08-21 | Discharge: 2012-08-21 | Disposition: A | Discharge status: Home / Self Care | Payer: Medicare Other | Source: Ambulatory Visit | Attending: Nephrology | Admitting: Nephrology

## 2012-08-21 LAB — POCT HEMOGLOBIN-HEMACUE: Hemoglobin: 11.5 g/dL — ABNORMAL LOW (ref 13.0–17.0)

## 2012-08-21 MED ORDER — DARBEPOETIN ALFA-POLYSORBATE 200 MCG/0.4ML IJ SOLN
INTRAMUSCULAR | Status: AC
  Administered 2012-08-21: 200 ug via INTRAVENOUS
  Filled 2012-08-21: qty 0.4

## 2012-08-21 MED ORDER — DARBEPOETIN ALFA-POLYSORBATE 200 MCG/0.4ML IJ SOLN
200.0000 ug | INTRAMUSCULAR | Status: DC
  Administered 2012-08-21: 200 ug via INTRAVENOUS

## 2012-09-04 ENCOUNTER — Encounter (HOSPITAL_COMMUNITY)
Admission: RE | Admit: 2012-09-04 | Discharge: 2012-09-04 | Disposition: A | Discharge status: Home / Self Care | Payer: Medicare Other | Source: Ambulatory Visit | Attending: Nephrology | Admitting: Nephrology

## 2012-09-04 LAB — RENAL FUNCTION PANEL
BUN: 86 mg/dL — ABNORMAL HIGH (ref 6–23)
CO2: 17 mEq/L — ABNORMAL LOW (ref 19–32)
Calcium: 6.7 mg/dL — ABNORMAL LOW (ref 8.4–10.5)
GFR calc Af Amer: 7 mL/min — ABNORMAL LOW (ref >90)
GFR calc non Af Amer: 6 mL/min — ABNORMAL LOW (ref >90)
Sodium: 143 mEq/L (ref 135–145)

## 2012-09-04 LAB — CBC
Hemoglobin: 12.1 g/dL — ABNORMAL LOW (ref 13.0–17.0)
MCHC: 31.9 g/dL (ref 30.0–36.0)
MCV: 102.7 fL — ABNORMAL HIGH (ref 78.0–100.0)
RDW: 15.6 % — ABNORMAL HIGH (ref 11.5–15.5)

## 2012-09-04 LAB — POCT HEMOGLOBIN-HEMACUE: Hemoglobin: 12.3 g/dL — ABNORMAL LOW (ref 13.0–17.0)

## 2012-09-04 MED ORDER — DARBEPOETIN ALFA-POLYSORBATE 200 MCG/0.4ML IJ SOLN: 200.0000 ug | INTRAMUSCULAR | Status: DC

## 2012-09-18 ENCOUNTER — Encounter (HOSPITAL_COMMUNITY)
Admission: RE | Admit: 2012-09-18 | Discharge: 2012-09-18 | Disposition: A | Discharge status: Home / Self Care | Payer: Medicare Other | Source: Ambulatory Visit | Attending: Nephrology | Admitting: Nephrology

## 2012-09-18 DIAGNOSIS — I129 Hypertensive chronic kidney disease with stage 1 through stage 4 chronic kidney disease, or unspecified chronic kidney disease: Secondary | ICD-10-CM | POA: Insufficient documentation

## 2012-09-18 DIAGNOSIS — D631 Anemia in chronic kidney disease: Secondary | ICD-10-CM | POA: Insufficient documentation

## 2012-09-18 DIAGNOSIS — N189 Chronic kidney disease, unspecified: Secondary | ICD-10-CM | POA: Insufficient documentation

## 2012-09-18 DIAGNOSIS — E119 Type 2 diabetes mellitus without complications: Secondary | ICD-10-CM | POA: Insufficient documentation

## 2012-09-18 LAB — POCT HEMOGLOBIN-HEMACUE: Hemoglobin: 10.9 g/dL — ABNORMAL LOW (ref 13.0–17.0)

## 2012-09-18 MED ORDER — DARBEPOETIN ALFA-POLYSORBATE 200 MCG/0.4ML IJ SOLN
200.0000 ug | INTRAMUSCULAR | Status: DC
  Administered 2012-09-18: 200 ug via INTRAVENOUS

## 2012-09-18 MED ORDER — DARBEPOETIN ALFA-POLYSORBATE 200 MCG/0.4ML IJ SOLN
INTRAMUSCULAR | Status: AC
  Filled 2012-09-18: qty 0.4

## 2012-10-02 ENCOUNTER — Inpatient Hospital Stay (HOSPITAL_COMMUNITY)
Admission: EM | Admit: 2012-10-02 | Discharge: 2012-10-08 | DRG: 378 | Disposition: A | Discharge status: Home / Self Care | Payer: Medicare Other | Attending: Internal Medicine | Admitting: Internal Medicine

## 2012-10-02 ENCOUNTER — Encounter (HOSPITAL_COMMUNITY): Payer: Self-pay | Admitting: Emergency Medicine

## 2012-10-02 ENCOUNTER — Encounter (HOSPITAL_COMMUNITY)
Admission: RE | Admit: 2012-10-02 | Discharge: 2012-10-02 | Disposition: A | Discharge status: Home / Self Care | Payer: Medicare Other | Source: Ambulatory Visit | Attending: Nephrology | Admitting: Nephrology

## 2012-10-02 DIAGNOSIS — K922 Gastrointestinal hemorrhage, unspecified: Secondary | ICD-10-CM | POA: Diagnosis present

## 2012-10-02 DIAGNOSIS — K31819 Angiodysplasia of stomach and duodenum without bleeding: Secondary | ICD-10-CM | POA: Diagnosis present

## 2012-10-02 DIAGNOSIS — N039 Chronic nephritic syndrome with unspecified morphologic changes: Secondary | ICD-10-CM | POA: Diagnosis present

## 2012-10-02 DIAGNOSIS — M19049 Primary osteoarthritis, unspecified hand: Secondary | ICD-10-CM | POA: Diagnosis present

## 2012-10-02 DIAGNOSIS — K5730 Diverticulosis of large intestine without perforation or abscess without bleeding: Secondary | ICD-10-CM | POA: Diagnosis present

## 2012-10-02 DIAGNOSIS — K5521 Angiodysplasia of colon with hemorrhage: Principal | ICD-10-CM | POA: Diagnosis present

## 2012-10-02 DIAGNOSIS — D649 Anemia, unspecified: Secondary | ICD-10-CM

## 2012-10-02 DIAGNOSIS — I129 Hypertensive chronic kidney disease with stage 1 through stage 4 chronic kidney disease, or unspecified chronic kidney disease: Secondary | ICD-10-CM | POA: Diagnosis present

## 2012-10-02 DIAGNOSIS — K2970 Gastritis, unspecified, without bleeding: Secondary | ICD-10-CM | POA: Diagnosis present

## 2012-10-02 DIAGNOSIS — N185 Chronic kidney disease, stage 5: Secondary | ICD-10-CM | POA: Diagnosis present

## 2012-10-02 DIAGNOSIS — D631 Anemia in chronic kidney disease: Secondary | ICD-10-CM | POA: Diagnosis present

## 2012-10-02 DIAGNOSIS — D62 Acute posthemorrhagic anemia: Secondary | ICD-10-CM | POA: Diagnosis present

## 2012-10-02 DIAGNOSIS — E875 Hyperkalemia: Secondary | ICD-10-CM

## 2012-10-02 DIAGNOSIS — N184 Chronic kidney disease, stage 4 (severe): Secondary | ICD-10-CM

## 2012-10-02 DIAGNOSIS — Z79899 Other long term (current) drug therapy: Secondary | ICD-10-CM

## 2012-10-02 DIAGNOSIS — N179 Acute kidney failure, unspecified: Secondary | ICD-10-CM | POA: Diagnosis present

## 2012-10-02 DIAGNOSIS — I10 Essential (primary) hypertension: Secondary | ICD-10-CM | POA: Diagnosis present

## 2012-10-02 DIAGNOSIS — K449 Diaphragmatic hernia without obstruction or gangrene: Secondary | ICD-10-CM | POA: Diagnosis present

## 2012-10-02 DIAGNOSIS — K2990 Gastroduodenitis, unspecified, without bleeding: Secondary | ICD-10-CM | POA: Diagnosis present

## 2012-10-02 HISTORY — DX: Unspecified osteoarthritis, unspecified site: M19.90

## 2012-10-02 HISTORY — DX: Essential (primary) hypertension: I10

## 2012-10-02 HISTORY — DX: Type 2 diabetes mellitus without complications: E11.9

## 2012-10-02 LAB — COMPREHENSIVE METABOLIC PANEL
ALT: 10 U/L (ref 0–53)
AST: 18 U/L (ref 0–37)
Albumin: 3.5 g/dL (ref 3.5–5.2)
Alkaline Phosphatase: 45 U/L (ref 39–117)
BUN: 125 mg/dL — ABNORMAL HIGH (ref 6–23)
CO2: 20 mEq/L (ref 19–32)
Calcium: 6.9 mg/dL — ABNORMAL LOW (ref 8.4–10.5)
Chloride: 100 mEq/L (ref 96–112)
Creatinine, Ser: 8.5 mg/dL — ABNORMAL HIGH (ref 0.50–1.35)
GFR calc Af Amer: 6 mL/min — ABNORMAL LOW (ref >90)
GFR calc non Af Amer: 5 mL/min — ABNORMAL LOW (ref >90)
Glucose, Bld: 155 mg/dL — ABNORMAL HIGH (ref 70–99)
Potassium: 4.6 mEq/L (ref 3.5–5.1)
Sodium: 139 mEq/L (ref 135–145)
Total Bilirubin: 0.3 mg/dL (ref 0.3–1.2)
Total Protein: 7.2 g/dL (ref 6.0–8.3)

## 2012-10-02 LAB — RENAL FUNCTION PANEL
Albumin: 3.5 g/dL (ref 3.5–5.2)
BUN: 125 mg/dL — ABNORMAL HIGH (ref 6–23)
CO2: 19 mEq/L (ref 19–32)
Calcium: 6.9 mg/dL — ABNORMAL LOW (ref 8.4–10.5)
Chloride: 101 mEq/L (ref 96–112)
Creatinine, Ser: 8.59 mg/dL — ABNORMAL HIGH (ref 0.50–1.35)
GFR calc Af Amer: 6 mL/min — ABNORMAL LOW (ref >90)
GFR calc non Af Amer: 5 mL/min — ABNORMAL LOW (ref >90)
Glucose, Bld: 150 mg/dL — ABNORMAL HIGH (ref 70–99)
Phosphorus: 5.3 mg/dL — ABNORMAL HIGH (ref 2.3–4.6)
Potassium: 4.2 mEq/L (ref 3.5–5.1)
Sodium: 140 mEq/L (ref 135–145)

## 2012-10-02 LAB — CBC
Hemoglobin: 6.3 g/dL — CL (ref 13.0–17.0)
MCH: 31.8 pg (ref 26.0–34.0)
MCHC: 33 g/dL (ref 30.0–36.0)
Platelets: 220 10*3/uL (ref 150–400)
RBC: 1.98 MIL/uL — ABNORMAL LOW (ref 4.22–5.81)

## 2012-10-02 LAB — CBC WITH DIFFERENTIAL/PLATELET
Basophils Absolute: 0 10*3/uL (ref 0.0–0.1)
Eosinophils Relative: 2 % (ref 0–5)
HCT: 12.5 % — ABNORMAL LOW (ref 39.0–52.0)
Lymphocytes Relative: 8 % — ABNORMAL LOW (ref 12–46)
Lymphs Abs: 0.8 10*3/uL (ref 0.7–4.0)
MCV: 99.2 fL (ref 78.0–100.0)
Neutro Abs: 8.6 10*3/uL — ABNORMAL HIGH (ref 1.7–7.7)
Platelets: 226 10*3/uL (ref 150–400)
RBC: 1.26 MIL/uL — ABNORMAL LOW (ref 4.22–5.81)
RDW: 15.2 % (ref 11.5–15.5)
WBC: 10 10*3/uL (ref 4.0–10.5)

## 2012-10-02 LAB — OCCULT BLOOD, POC DEVICE: Fecal Occult Bld: POSITIVE — AB

## 2012-10-02 LAB — PREPARE RBC (CROSSMATCH)

## 2012-10-02 MED ORDER — PANTOPRAZOLE SODIUM 40 MG IV SOLR
8.0000 mg/h | INTRAVENOUS | Status: DC
  Administered 2012-10-02 – 2012-10-06 (×6): 8 mg/h via INTRAVENOUS
  Filled 2012-10-02 (×17): qty 80

## 2012-10-02 MED ORDER — POLYETHYLENE GLYCOL 3350 17 G PO PACK
17.0000 g | PACK | Freq: Every day | ORAL | Status: DC | PRN
  Filled 2012-10-02: qty 1

## 2012-10-02 MED ORDER — ONDANSETRON HCL 4 MG/2ML IJ SOLN: 4.0000 mg | Freq: Four times a day (QID) | INTRAMUSCULAR | Status: DC | PRN

## 2012-10-02 MED ORDER — ONDANSETRON HCL 4 MG PO TABS: 4.0000 mg | ORAL_TABLET | Freq: Four times a day (QID) | ORAL | Status: DC | PRN

## 2012-10-02 MED ORDER — DARBEPOETIN ALFA-POLYSORBATE 200 MCG/0.4ML IJ SOLN: 200.0000 ug | INTRAMUSCULAR | Status: DC

## 2012-10-02 MED ORDER — SODIUM CHLORIDE 0.9 % IV SOLN: INTRAVENOUS | Status: AC

## 2012-10-02 MED ORDER — HYDROCODONE-ACETAMINOPHEN 5-325 MG PO TABS
1.0000 | ORAL_TABLET | ORAL | Status: DC | PRN
  Administered 2012-10-05 – 2012-10-08 (×7): 2 via ORAL
  Filled 2012-10-02: qty 1
  Filled 2012-10-02 (×3): qty 2
  Filled 2012-10-02: qty 1
  Filled 2012-10-02: qty 2
  Filled 2012-10-02: qty 1
  Filled 2012-10-02: qty 2
  Filled 2012-10-02: qty 1

## 2012-10-02 MED ORDER — MORPHINE SULFATE 2 MG/ML IJ SOLN: 1.0000 mg | INTRAMUSCULAR | Status: DC | PRN

## 2012-10-02 MED ORDER — FERROUS SULFATE 325 (65 FE) MG PO TABS
325.0000 mg | ORAL_TABLET | Freq: Two times a day (BID) | ORAL | Status: DC
  Administered 2012-10-03 – 2012-10-08 (×11): 325 mg via ORAL
  Filled 2012-10-02 (×14): qty 1

## 2012-10-02 MED ORDER — SODIUM CHLORIDE 0.9 % IJ SOLN
3.0000 mL | Freq: Two times a day (BID) | INTRAMUSCULAR | Status: DC
  Administered 2012-10-06 – 2012-10-07 (×4): 3 mL via INTRAVENOUS

## 2012-10-02 MED ORDER — SODIUM CHLORIDE 0.9 % IV SOLN
INTRAVENOUS | Status: AC
  Administered 2012-10-03: 01:00:00 via INTRAVENOUS

## 2012-10-02 MED ORDER — LINAGLIPTIN 5 MG PO TABS
5.0000 mg | ORAL_TABLET | Freq: Every day | ORAL | Status: DC
  Administered 2012-10-03 – 2012-10-08 (×5): 5 mg via ORAL
  Filled 2012-10-02 (×6): qty 1

## 2012-10-02 MED ORDER — SODIUM CHLORIDE 0.9 % IV SOLN
80.0000 mg | Freq: Once | INTRAVENOUS | Status: AC
  Administered 2012-10-02: 80 mg via INTRAVENOUS
  Filled 2012-10-02: qty 80

## 2012-10-02 NOTE — ED Provider Notes (Signed)
History     CSN: [id number]  Arrival date & time 10/02/12  1237   First MD Initiated Contact with Patient 10/02/12 1316      No chief complaint on file.   (Consider location/radiation/quality/duration/timing/severity/associated sxs/prior treatment) The history is provided by the patient.  [name] is a 79 y.o. male hx of anemia (secondary to renal disease per patient) on Epogen, HTN, DM here with anemia. He has been feeling weak for the last 2 weeks. Has routine CBC that showed Hg 6.3. Sent for eval from PMD's office. He denies chest pain or SOB. But he has chronic dark stool from taking iron. The stool is not more dark than usual. However, he took some aspirin and NSAIDs several days ago for his arthritis. No hx of PUD or colon cancer. Last colonoscopy by Dr. Elnoria [name] several ago and patient doesn't remember the result.    Past Medical History  Diagnosis Date  . Hypertension   . Renal disorder   . Diabetes mellitus without complication   . Arthritis     No past surgical history on file.  No family history on file.  History  Substance Use Topics  . Smoking status: Never Smoker   . Smokeless tobacco: Not on file  . Alcohol Use: Yes      Review of Systems  Gastrointestinal:       Melena   Neurological: Positive for weakness.  All other systems reviewed and are negative.    Allergies  Review of patient's allergies indicates no known allergies.  Home Medications   Current Outpatient Rx  Name  Route  Sig  Dispense  Refill  . AMLODIPINE BESYLATE 10 MG PO TABS   Oral   Take 10 mg by mouth Daily.          Marland Kitchen FERROUS SULFATE 325 (65 FE) MG PO TABS   Oral   Take 325 mg by mouth 2 (two) times daily with a meal.         . SITAGLIPTIN PHOSPHATE 50 MG PO TABS   Oral   Take 50 mg by mouth daily.         . TELMISARTAN 80 MG PO TABS   Oral   Take 80 mg by mouth daily.           BP 142/65  Pulse 94  Temp 97.9 F (36.6 C) (Oral)  Resp 18  SpO2  100%  Physical Exam  Nursing note and vitals reviewed. Constitutional: He is oriented to person, place, and time. He appears well-developed.       Pale   HENT:  Head: Normocephalic and atraumatic.  Mouth/Throat: Oropharynx is clear and moist.  Eyes: Pupils are equal, round, and reactive to light.       Conjunctiva pale   Neck: Normal range of motion. Neck supple.  Cardiovascular: Normal rate, regular rhythm and normal heart sounds.   Pulmonary/Chest: Effort normal and breath sounds normal. No respiratory distress. He has no wheezes. He has no rales.  Abdominal: Soft. Bowel sounds are normal. He exhibits no distension. There is no tenderness. There is no rebound.       Rectal- melena, no hemorrhoids   Musculoskeletal: Normal range of motion.  Neurological: He is alert and oriented to person, place, and time.  Skin: Skin is warm and dry.  Psychiatric: He has a normal mood and affect. His behavior is normal. Judgment and thought content normal.    ED Course  Procedures (including critical care time)  CRITICAL CARE Performed by: [name], [name]   Total critical care time: 30 min   Critical care time was exclusive of separately billable procedures and treating other patients.  Critical care was necessary to treat or prevent imminent or life-threatening deterioration.  Critical care was time spent personally by me on the following activities: development of treatment plan with patient and/or surrogate as well as nursing, discussions with consultants, evaluation of patient's response to treatment, examination of patient, obtaining history from patient or surrogate, ordering and performing treatments and interventions, ordering and review of laboratory studies, ordering and review of radiographic studies, pulse oximetry and re-evaluation of patient's condition.   Labs Reviewed  CBC WITH DIFFERENTIAL - Abnormal; Notable for the following:    RBC 1.26 (*)     Hemoglobin 4.1 (*)     HCT  12.5 (*)     Neutrophils Relative 86 (*)     Neutro Abs 8.6 (*)     Lymphocytes Relative 8 (*)     All other components within normal limits  COMPREHENSIVE METABOLIC PANEL - Abnormal; Notable for the following:    Glucose, Bld 155 (*)     BUN 125 (*)     Creatinine, Ser 8.50 (*)     Calcium 6.9 (*)     GFR calc non Af Amer 5 (*)     GFR calc Af Amer 6 (*)     All other components within normal limits  OCCULT BLOOD, POC DEVICE - Abnormal; Notable for the following:    Fecal Occult Bld POSITIVE (*)     All other components within normal limits  PROTIME-INR  TYPE AND SCREEN  PREPARE RBC (CROSSMATCH)   No results found.   No diagnosis found.    MDM  [name] is a 79 y.o. male here with melena, low Hg. Repeat Hg 4.3. Given that he was taking ASA and NSAIDs, I need to r/o PUD bleeding. He was given protonix. I ordered PRBC transfusions.  3:20 PM Cr at baseline. I called Dr. Elnoria [name], who will see patient today. I will admit him to medicine for GI bleed. I called [name] who accepted him on tele.         Andrew Canal, MD 10/02/12 1550

## 2012-10-02 NOTE — Consult Note (Signed)
  Pt  States that he has been taking ASA  For arthritis in his hands  . Hg that is usually in the 11and 12 range is now in the  6 and below range. Also creatinine is in the 8 to 9 range. Pt. Being admitted by triad.

## 2012-10-02 NOTE — ED Notes (Signed)
IV team contacted for second line

## 2012-10-02 NOTE — ED Notes (Signed)
DR. Bascom Levels at the bedside.

## 2012-10-02 NOTE — H&P (Addendum)
[facility] History and Physical  [name] ZOX:[id number] DOB: 12-Sep-1931 DOA: 10/02/2012  Referring physician: Dr Silverio Lay PCP: Jeri Cos, MD  Specialists: Dr. Elnoria Howard  Chief Complaint: Confusion  HPI: [name] is a 79 y.o. male  with past medical history of chronic renal disease stage IV currently not on dialysis, past medical history of anemia secondary to chronic renal disease on Epogen, hypertension, also with past medical history of GI bleed in 2007 comes in for feeling weak for the last 2 weeks and confusion 2 days prior to admission. He had a routine CBC at this M.D. office which showed a hemoglobin of 6.3. So he was sent by his primary care Dr. to the emergency room here in the emergency room his hemoglobin is 4. He denies any change in his stools, he is on chronic iron therapy so they are always black. He has been taking aspirin and NSAIDs for osteoarthritis of the hand. He relates his confusion was intermittent it has not gotten worse. He relates no recent falls, chest pain shortness of breath nausea vomiting fever or diarrhea. He also denies abdominal pain Here in the ED he was started on 2 units of packed red blood cells and given Protonix. And we were asked to consult and further evaluate.  Review of Systems: The patient denies anorexia, fever, weight loss,, vision loss, decreased hearing, hoarseness, chest pain, syncope, dyspnea on exertion, peripheral edema, balance deficits, hemoptysis, abdominal pain,, hematochezia, severe indigestion/heartburn, hematuria, incontinence, genital sores, muscle weakness, suspicious skin lesions, transient blindness, difficulty walking, depression, unusual weight change, abnormal bleeding, enlarged lymph nodes, angioedema, and breast masses.    Past Medical History  Diagnosis Date  . Hypertension   . Renal disorder   . Diabetes mellitus without complication   . Arthritis    History reviewed. No pertinent past surgical  history. Social History:  reports that he has never smoked. He does not have any smokeless tobacco history on file. He reports that he drinks alcohol. His drug history not on file.  is at home with his wife can perform all his daily activities.  No Known Allergies  Family History  Problem Relation Age of Onset  . Sudden death Mother   . Sudden death Father     Prior to Admission medications   Medication Sig Start Date End Date Taking? Authorizing Provider  amLODipine (NORVASC) 10 MG tablet Take 10 mg by mouth Daily.  08/15/12  Yes Historical Provider, MD  ferrous sulfate 325 (65 FE) MG tablet Take 325 mg by mouth 2 (two) times daily with a meal.   Yes Historical Provider, MD  sitaGLIPtin (JANUVIA) 50 MG tablet Take 50 mg by mouth daily.   Yes Historical Provider, MD  telmisartan (MICARDIS) 80 MG tablet Take 80 mg by mouth daily.   Yes Historical Provider, MD   Physical Exam: Filed Vitals:   10/02/12 1300 10/02/12 1514 10/02/12 1602  BP: 139/61 142/65 160/70  Pulse: 86 94 92  Temp: 97.9 F (36.6 C)  98.1 F (36.7 C)  TempSrc: Oral  Oral  Resp: 14 18 20   SpO2: 100% 100% 100%     General:  Awake alert and oriented x3 in no acute distress  Eyes:  anicteric, he has pallor on sclera  ENT:  Dry mucous membrane no JVD  Neck:  No JVD  Cardiovascular:  Regular weight and rhythm with positive S1 and S2 no murmurs rubs gallops  Respiratory:  Good air movement and clear to auscultation  Abdomen:  Positive bowel sounds nontender nondistended soft  Skin:  No rashes or ulceration  Musculoskeletal:  Intact  Psychiatric: appropriate  Neurologic:  Awake alert and oriented x4 coherent for language in a good mood nonfocal  Labs on Admission:  Basic Metabolic Panel:  Lab 10/02/12 1610 10/02/12 1130  NA 139 140  K 4.6 4.2  CL 100 101  CO2 20 19  GLUCOSE 155* 150*  BUN 125* 125*  CREATININE 8.50* 8.59*  CALCIUM 6.9* 6.9*  MG -- --  PHOS -- 5.3*   Liver Function  Tests:  Lab 10/02/12 1327 10/02/12 1130  AST 18 --  ALT 10 --  ALKPHOS 45 --  BILITOT 0.3 --  PROT 7.2 --  ALBUMIN 3.5 3.5   No results found for this basename: LIPASE:5,AMYLASE:5 in the last 168 hours No results found for this basename: AMMONIA:5 in the last 168 hours CBC:  Lab 10/02/12 1327 10/02/12 1116  WBC 10.0 6.8  NEUTROABS 8.6* --  HGB 4.1* 6.3*  HCT 12.5* 19.1*  MCV 99.2 96.5  PLT 226 220   Cardiac Enzymes: No results found for this basename: CKTOTAL:5,CKMB:5,CKMBINDEX:5,TROPONINI:5 in the last 168 hours  BNP (last 3 results) No results found for this basename: PROBNP:3 in the last 8760 hours CBG: No results found for this basename: GLUCAP:5 in the last 168 hours  Radiological Exams on Admission: No results found.  EKG: none  Assessment/Plan Active Problems: Acute GI bleeding/acute blood loss anemia: - He has had a previous history of severe microcytic anemia secondary to GI blood loss back in 2007, with an EGD that showed esophagitis. - As rectal exam done by the emergency room physician shows melanotic stools and his FOBT was positive. I agree with transfusing him 2 packs of red blood cells, starting him on Protonix IV. And recheck in a CBC posttransfusion. We'll have already called Dr. Elnoria Howard for an endoscopy. We'll place him n.p.o. - Will monitor strict I.'s and O.'s. I will be careful with hydration as he does have chronic renal disease and his creatinine has increased from baseline. As per Dr. Leretha Dykes his hemoglobin usually ranges from 11-12  AKI/ Chronic renal disease, stage IV: - Back in 2013 his hemoglobin was 7.5, today is 8.5. This rise in his creatinine is most likely secondary to decreased intravascular volume and use of NSAIDs. We'll go ahead and start him on gentle IV fluids. Blood is expander which will also help with his creatinine. I will go ahead and hold his ACE inhibitor. And recheck a basic metabolic panel in the morning. - Dr. Leretha Dykes this  nephrologist has been notified.   Hypertension: - Hold his blood pressure medication started gentle hydration. Continue monitor his vitals.   already consulted GI for possible endoscopy  Code Status: full  Family Communication: wife Disposition Plan: home 1-2 days (indicate anticipated LOS)  Time spent: [address] Rosine Beat [facility] Pager [phone]  If 7PM-7AM, please contact night-coverage [email or web address] Password [facility] 10/02/2012, 4:08 PM

## 2012-10-02 NOTE — Progress Notes (Signed)
Patient presented to Short Stay today complaining of weakness. States that his stomach has not felt right the last few days. Denies nausea, vomiting, diarrhea. Skin color is pale. Patient arrived via wheel chair. States that his wife drove him today because he was too weak to drive. CBC and Renal panel were drawn as per orders for today. Hemoglobin 6.3 was called to me from lab. Patient denies black or bloody stools, chest pain, shortness of breath. Dr. Bascom Levels was notified. Patient to go to ED per Dr. Bascom Levels for type and cross match and 1 unit  PRBC today, and to Short Stay for one unit tomorrow per Dr. Bascom Levels. ED charge nurse notified. States that Dr. Bascom Levels just  And patient will be seen by ED MD. Patient and wife accompanied to ED via wheel chairs. Renal Panel pending.

## 2012-10-02 NOTE — ED Notes (Signed)
Pt was at short stay and was getting his hgb checked and it was 6.3 dr frazier called and wants pt seen in er to determine cause of low hgb . Pt states on no blood thinners.

## 2012-10-02 NOTE — Consult Note (Signed)
Reason for Consult:Iron Deficiency Anemia Referring Physician: [facility]  Felix Crisman HPI: This is a 79 year old male who has a history of IDA.  He was feeling weak and in his PCP's office he noted to have an HGB in the 6 range.  Subsequently in the ER his HGB was noted to be in the 4 range.  He is heme positive and his stools are black as a result of chronic iron supplementation.  Also, he underwent an EGD/Colonoscopy in 07/2006 and 08/2007.  Both of the EGDs revealed an LA Grade A esophagitis.  The 2007 examination showed a mild duodenitis.  The colonoscopies were positive for left-sided diverticula, but I was able to identify a descending colon nonbleeding AVM.  A capsule endoscopy was performed in 2008, but it was negative for any AVMs or suspicious bleeding sites.  Recently he had a brief NSAID use with ASA for arthritis.  Past Medical History  Diagnosis Date  . Hypertension   . Renal disorder   . Diabetes mellitus without complication   . Arthritis     History reviewed. No pertinent past surgical history.  Family History  Problem Relation Age of Onset  . Sudden death Mother   . Sudden death Father     Social History:  reports that he has never smoked. He does not have any smokeless tobacco history on file. He reports that he drinks alcohol. His drug history not on file.  Allergies: No Known Allergies  Medications:  Scheduled:  Continuous:   . pantoprozole (PROTONIX) infusion 8 mg/hr (10/02/12 1633)    Results for orders placed during the hospital encounter of 10/02/12 (from the past 24 hour(s))  CBC WITH DIFFERENTIAL     Status: Abnormal   Collection Time   10/02/12  1:27 PM      Component Value Range   WBC 10.0  4.0 - 10.5 K/uL   RBC 1.26 (*) 4.22 - 5.81 MIL/uL   Hemoglobin 4.1 (*) 13.0 - 17.0 g/dL   HCT 16.1 (*) 09.6 - 04.5 %   MCV 99.2  78.0 - 100.0 fL   MCH 32.5  26.0 - 34.0 pg   MCHC 32.8  30.0 - 36.0 g/dL   RDW 40.9  81.1 - 91.4 %   Platelets 226  150  - 400 K/uL   Neutrophils Relative 86 (*) 43 - 77 %   Neutro Abs 8.6 (*) 1.7 - 7.7 K/uL   Lymphocytes Relative 8 (*) 12 - 46 %   Lymphs Abs 0.8  0.7 - 4.0 K/uL   Monocytes Relative 4  3 - 12 %   Monocytes Absolute 0.4  0.1 - 1.0 K/uL   Eosinophils Relative 2  0 - 5 %   Eosinophils Absolute 0.2  0.0 - 0.7 K/uL   Basophils Relative 0  0 - 1 %   Basophils Absolute 0.0  0.0 - 0.1 K/uL  COMPREHENSIVE METABOLIC PANEL     Status: Abnormal   Collection Time   10/02/12  1:27 PM      Component Value Range   Sodium 139  135 - 145 mEq/L   Potassium 4.6  3.5 - 5.1 mEq/L   Chloride 100  96 - 112 mEq/L   CO2 20  19 - 32 mEq/L   Glucose, Bld 155 (*) 70 - 99 mg/dL   BUN 782 (*) 6 - 23 mg/dL   Creatinine, Ser 9.56 (*) 0.50 - 1.35 mg/dL   Calcium 6.9 (*) 8.4 - 10.5 mg/dL  Total Protein 7.2  6.0 - 8.3 g/dL   Albumin 3.5  3.5 - 5.2 g/dL   AST 18  0 - 37 U/L   ALT 10  0 - 53 U/L   Alkaline Phosphatase 45  39 - 117 U/L   Total Bilirubin 0.3  0.3 - 1.2 mg/dL   GFR calc non Af Amer 5 (*) >90 mL/min   GFR calc Af Amer 6 (*) >90 mL/min  PROTIME-INR     Status: Normal   Collection Time   10/02/12  1:27 PM      Component Value Range   Prothrombin Time 14.1  11.6 - 15.2 seconds   INR 1.10  0.00 - 1.49  OCCULT BLOOD, POC DEVICE     Status: Abnormal   Collection Time   10/02/12  1:38 PM      Component Value Range   Fecal Occult Bld POSITIVE (*) NEGATIVE  TYPE AND SCREEN     Status: Normal (Preliminary result)   Collection Time   10/02/12  1:50 PM      Component Value Range   ABO/RH(D) AB POS     Antibody Screen NEG     Sample Expiration 10/05/2012     Unit Number [id number]     Blood Component Type RED CELLS,LR     Unit division 00     Status of Unit ISSUED     Transfusion Status OK TO TRANSFUSE     Crossmatch Result Compatible     Unit Number [id number]     Blood Component Type RED CELLS,LR     Unit division 00     Status of Unit ALLOCATED     Transfusion Status OK TO TRANSFUSE      Crossmatch Result Compatible    PREPARE RBC (CROSSMATCH)     Status: Normal   Collection Time   10/02/12  2:30 PM      Component Value Range   Order Confirmation ORDER PROCESSED BY BLOOD BANK       No results found.  ROS:  As stated above in the HPI otherwise negative.  Blood pressure 142/68, pulse 88, temperature 97.9 F (36.6 C), temperature source Oral, resp. rate 18, SpO2 100.00%.    PE: Gen: NAD, Alert and Oriented HEENT:  [place]/AT, EOMI Neck: Supple, no LAD Lungs: CTA Bilaterally CV: RRR without M/G/R ABM: Soft, NTND, +BS Ext: No C/C/E  Assessment/Plan: 1) Anemia/Heme positive stool. 2) Weakness. 3) Renal insufficiency.   Given his history further evaluation is required.  I will perform an EGD tomorrow with his complaints of melena.  He may require a colonoscopy, but I will make that determination pending the findings of the EGD.  Esophagitis may a problem for him as it was noted in the prior procedures.  Plan: 1) EGD tomorrow and further recommendations pending the findings.  Keval Nam D 10/02/2012, 4:36 PM

## 2012-10-03 ENCOUNTER — Encounter (HOSPITAL_COMMUNITY): Payer: Self-pay | Admitting: Gastroenterology

## 2012-10-03 ENCOUNTER — Encounter (HOSPITAL_COMMUNITY)
Admission: EM | Disposition: A | Discharge status: Home / Self Care | Payer: Self-pay | Source: Home / Self Care | Attending: Internal Medicine

## 2012-10-03 HISTORY — PX: ESOPHAGOGASTRODUODENOSCOPY: SHX5428

## 2012-10-03 LAB — CBC WITH DIFFERENTIAL/PLATELET
Eosinophils Absolute: 0.2 10*3/uL (ref 0.0–0.7)
Lymphocytes Relative: 15 % (ref 12–46)
Lymphs Abs: 1 10*3/uL (ref 0.7–4.0)
Neutro Abs: 4.8 10*3/uL (ref 1.7–7.7)
Neutrophils Relative %: 75 % (ref 43–77)
Platelets: 178 10*3/uL (ref 150–400)
RBC: 2.36 MIL/uL — ABNORMAL LOW (ref 4.22–5.81)
WBC: 6.3 10*3/uL (ref 4.0–10.5)

## 2012-10-03 LAB — GLUCOSE, CAPILLARY
Glucose-Capillary: 152 mg/dL — ABNORMAL HIGH (ref 70–99)
Glucose-Capillary: 117 mg/dL — ABNORMAL HIGH (ref 70–99)

## 2012-10-03 LAB — TYPE AND SCREEN
Unit division: 0
Unit division: 0

## 2012-10-03 LAB — RENAL FUNCTION PANEL
Albumin: 3 g/dL — ABNORMAL LOW (ref 3.5–5.2)
CO2: 20 mEq/L (ref 19–32)
Calcium: 6.6 mg/dL — ABNORMAL LOW (ref 8.4–10.5)
Chloride: 105 mEq/L (ref 96–112)
GFR calc Af Amer: 6 mL/min — ABNORMAL LOW (ref >90)
GFR calc non Af Amer: 5 mL/min — ABNORMAL LOW (ref >90)
Sodium: 142 mEq/L (ref 135–145)

## 2012-10-03 LAB — APTT: aPTT: 31 seconds (ref 24–37)

## 2012-10-03 LAB — PROTIME-INR: Prothrombin Time: 14.6 seconds (ref 11.6–15.2)

## 2012-10-03 SURGERY — EGD (ESOPHAGOGASTRODUODENOSCOPY)
Anesthesia: Moderate Sedation

## 2012-10-03 MED ORDER — MIDAZOLAM HCL 5 MG/ML IJ SOLN
INTRAMUSCULAR | Status: AC
  Filled 2012-10-03: qty 2

## 2012-10-03 MED ORDER — SODIUM CHLORIDE 0.9 % IV SOLN: INTRAVENOUS | Status: DC

## 2012-10-03 MED ORDER — LORAZEPAM 2 MG/ML IJ SOLN
0.5000 mg | Freq: Once | INTRAMUSCULAR | Status: AC
  Administered 2012-10-03: 0.5 mg via INTRAVENOUS
  Filled 2012-10-03: qty 1

## 2012-10-03 MED ORDER — FENTANYL CITRATE 0.05 MG/ML IJ SOLN
INTRAMUSCULAR | Status: AC
  Filled 2012-10-03: qty 2

## 2012-10-03 MED ORDER — FENTANYL CITRATE 0.05 MG/ML IJ SOLN
INTRAMUSCULAR | Status: DC | PRN
  Administered 2012-10-03 (×3): 25 ug via INTRAVENOUS

## 2012-10-03 MED ORDER — MIDAZOLAM HCL 10 MG/2ML IJ SOLN
INTRAMUSCULAR | Status: DC | PRN
  Administered 2012-10-03 (×3): 2 mg via INTRAVENOUS

## 2012-10-03 MED ORDER — PEG 3350-KCL-NA BICARB-NACL 420 G PO SOLR
4000.0000 mL | Freq: Once | ORAL | Status: AC
  Administered 2012-10-03: 4000 mL via ORAL
  Filled 2012-10-03: qty 4000

## 2012-10-03 NOTE — Progress Notes (Signed)
Pt completed golightly bowel prep. Pt's bm is bright red colored. Pt's vs are asymptomatic except BP elevated in 170's systolic. HgB increased to 7.4 from admission HgB of 4.1. Will continue to monitor. MD made aware.  

## 2012-10-03 NOTE — Op Note (Signed)
[facility] [address] [place], [address]   OPERATIVE PROCEDURE REPORT  PATIENT: [name], [name]  MR#: [id number] BIRTHDATE: 1932-08-04  GENDER: Male ENDOSCOPIST: Jeani Hawking, MD ASSISTANT:   Darral Dash, RN and Oletha Blend, technician PROCEDURE DATE: 10/03/2012 PROCEDURE:   EGD w/ ablation ASA CLASS:   Class III INDICATIONS:Anemia and Heme positive stool. MEDICATIONS: Versed 6 mg IV and Fentanyl 75 mcg IV TOPICAL ANESTHETIC:  DESCRIPTION OF PROCEDURE:   After the risks benefits and alternatives of the procedure were thoroughly explained, informed consent was obtained.  The Pentax Gastroscope [id number]  endoscope was introduced through the mouth  and advanced to the second portion of the duodenum Without limitations.      The instrument was slowly withdrawn as the mucosa was fully examined.    FINDINGs: A small sliding hiatal hernia was identified.  No evidence of any esophagitis during this examination.  A mild inflammation was noted at the pylorus.  In the second part of the duodenum 4 very small nonbleeding AVMs were identified and ablated.  I do not feel that these are the sources of his anemia.   Retroflexed views revealed no abnormalities.     The scope was then withdrawn from the patient and the procedure terminated.  COMPLICATIONS: There were no complications.  IMPRESSION: 1) Small sliding hiatal hernia. 2) Mild gastritis. 3) Nonbleeding duodenal AVMs.  RECOMMENDATIONS: 1) Colonoscopy tomorrow.   _______________________________ eSignedJeani Hawking, MD 10/03/2012 11:33 AM

## 2012-10-03 NOTE — Progress Notes (Signed)
Utilization review completed.  

## 2012-10-03 NOTE — Evaluation (Signed)
Physical Therapy Evaluation Patient Details Name: [name] MRN: [id number] DOB: 04-Dec-1931 Today's Date: 10/03/2012 Time: 5621-3086 PT Time Calculation (min): 33 min  PT Assessment / Plan / Recommendation Clinical Impression  Pt is a 79 y/o male admitted with severe anemia and h/o GI bleed; PMH also positive for chronic stage IV renal disease currently not on dialysis.  He presents to PT with decreased balance, decreased AROM and strengrth especially in hands, and decreased knowledge of DME use and will benefit from skilled PT in the acute setting to maximize independence and safety with mobility to allow d/c home with wife assist and HHPT.  Currently needs RW, but not safe using it independently; feel will only need it temporarily due to weakness from anemia and bedrest and joint stiffness with bedrest.    PT Assessment  Patient needs continued PT services    Follow Up Recommendations  Home health PT;Supervision for mobility/OOB          Equipment Recommendations  None recommended by PT    Recommendations for Other Services   OT consult for hand rehab  Frequency Min 3X/week    Precautions / Restrictions Precautions Precautions: Fall   Pertinent Vitals/Pain VSS with ambulation and pain generalized 3-4/10      Mobility  Bed Mobility Bed Mobility: Supine to Sit Supine to Sit: 6: Modified independent (Device/Increase time) Transfers Transfers: Sit to Stand;Stand to Sit Sit to Stand: 5: Supervision;From bed;4: Min assist;From chair/3-in-1;With upper extremity assist (supervision from bed, min assist from chair with no arms) Stand to Sit: 4: Min guard;To bed;To chair/3-in-1;With upper extremity assist Ambulation/Gait Ambulation/Gait Assistance: 4: Min assist Ambulation Distance (Feet): 200 Feet Assistive device: Rolling walker Ambulation/Gait Assistance Details: forward flexed with walker out in front despite cues to keep hips inside walker and pt c/o walker getting away  from him Gait Pattern: Trunk flexed           PT Diagnosis: Abnormality of gait;Generalized weakness  PT Problem List: Decreased strength;Decreased range of motion;Decreased balance;Decreased knowledge of use of DME;Decreased mobility;Decreased safety awareness PT Treatment Interventions: DME instruction;Gait training;Stair training;Functional mobility training;Therapeutic activities;Balance training;Therapeutic exercise;Patient/family education   PT Goals Acute Rehab PT Goals PT Goal Formulation: With patient Time For Goal Achievement: 10/10/12 Potential to Achieve Goals: Good Pt will go Sit to Stand: with modified independence PT Goal: Sit to Stand - Progress: Goal set today Pt will go Stand to Sit: with modified independence PT Goal: Stand to Sit - Progress: Goal set today Pt will Stand: with modified independence;3 - 5 min;with unilateral upper extremity support PT Goal: Stand - Progress: Goal set today Pt will Ambulate: >150 feet;with modified independence;with rolling walker;with least restrictive assistive device PT Goal: Ambulate - Progress: Goal set today Pt will Go Up / Down Stairs: 3-5 stairs;with rail(s);with supervision PT Goal: Up/Down Stairs - Progress: Goal set today Pt will Perform Home Exercise Program: with supervision, verbal cues required/provided PT Goal: Perform Home Exercise Program - Progress: Goal set today  Visit Information  Last PT Received On: 10/03/12 Assistance Needed: +1    Subjective Data  Subjective: I've been in the bed all day yesterday and all night Patient Stated Goal: To return to independent   Prior Functioning  Home Living Lives With: Spouse Available Help at Discharge: Family Type of Home: House Home Access: Stairs to enter Secretary/administrator of Steps: 3 Entrance Stairs-Rails: Right Home Layout: Two level;Bed/bath upstairs Alternate Level Stairs-Number of Steps: stair lift Home Adaptive Equipment: Shower chair with  back;Straight  cane;Walker - rolling Prior Function Level of Independence: Independent Driving: Yes Communication Communication: No difficulties Dominant Hand: Right    Cognition  Overall Cognitive Status: Appears within functional limits for tasks assessed/performed Arousal/Alertness: Awake/alert Orientation Level: Appears intact for tasks assessed Behavior During Session: [facility] for tasks performed    Extremity/Trunk Assessment Right Upper Extremity Assessment RUE ROM/Strength/Tone: Deficits RUE ROM/Strength/Tone Deficits: edema throughout MCP's esp 2nd with inability to flex fingers, shoulder elbow AROM/strength [facility] RUE Sensation: WFL - Light Touch Left Upper Extremity Assessment LUE ROM/Strength/Tone: Deficits LUE ROM/Strength/Tone Deficits: weak grasp with unable to fully flex middle finger at PIP& DIP; other fingers WFL, elbow and shoulder AROM/strength WFL LUE Sensation: WFL - Light Touch Right Lower Extremity Assessment RLE ROM/Strength/Tone: WFL for tasks assessed RLE Sensation: WFL - Light Touch Left Lower Extremity Assessment LLE ROM/Strength/Tone: WFL for tasks assessed (noted left knee with arthritic changes) LLE Sensation: WFL - Light Touch Trunk Assessment Trunk Assessment: Normal (stiff throughout)   Balance Balance Balance Assessed: Yes Static Standing Balance Static Standing - Balance Support: No upper extremity supported Static Standing - Level of Assistance: 4: Min assist Static Standing - Comment/# of Minutes: stood at bedside couple of seconds before loss of balance posterior and assisted pt to sit on bed Dynamic Standing Balance Dynamic Standing - Balance Support: Bilateral upper extremity supported Dynamic Standing - Level of Assistance: 4: Min assist Dynamic Standing - Comments: marching in place with walker and min assist; pt c/o feeling unsteady  End of Session PT - End of Session Equipment Utilized During Treatment: Gait belt Activity Tolerance:  Patient tolerated treatment well Patient left: in bed;with call bell/phone within reach;with nursing in room  GP     [facility] 10/03/2012, 9:47 AM  Sheran Lawless, PT [phone] 10/03/2012

## 2012-10-03 NOTE — Interval H&P Note (Signed)
History and Physical Interval Note:  10/03/2012 10:46 AM  [name]  has presented today for surgery, with the diagnosis of Anemia and heme positive stool  The various methods of treatment have been discussed with the patient and family. After consideration of risks, benefits and other options for treatment, the patient has consented to  Procedure(s) (LRB) with comments: ESOPHAGOGASTRODUODENOSCOPY (EGD) (N/A) as a surgical intervention .  The patient's history has been reviewed, patient examined, no change in status, stable for surgery.  I have reviewed the patient's chart and labs.  Questions were answered to the patient's satisfaction.     [name] D

## 2012-10-03 NOTE — Progress Notes (Signed)
[facility] PROGRESS NOTE  [name] ONG:[id number] DOB: 1932-08-13 DOA: 10/02/2012 PCP: Jeri Cos, MD  Assessment/Plan: Active Problems:  Acute GI bleeding  AKI (acute kidney injury)  Chronic renal disease, stage IV  Hypertension  Acute blood loss anemia    Acute GI bleeding/acute blood loss anemia:  - He has had a previous history of severe microcytic anemia secondary to GI blood loss back in 2007, with an EGD that showed esophagitis.  - As rectal exam done by the emergency room physician shows melanotic stools and his FOBT was positive. I agree with transfusing him 2 packs of red blood cells, starting him on Protonix IV. Posttransfusion CBC is 7.3/29.8  Endoscopy there - Will monitor strict I.'s and O.'s. I will be careful with hydration as he does have chronic renal disease and his creatinine has increased from baseline. As per Dr. Leretha Dykes his hemoglobin usually ranges from 11-12    AKI/ Chronic renal disease, stage IV:  - Back in 2013 his hemoglobin was 7.5, today is 8.5. This rise in his creatinine is most likely secondary to decreased intravascular volume and use of NSAIDs. We'll go ahead and start him on gentle IV fluids. Blood is expander which will also help with his creatinine. I will go ahead and hold his ACE inhibitor. And recheck a basic metabolic panel in the morning.  - Dr. Leretha Dykes this nephrologist has been notified.   Hypertension:  - Hold his blood pressure medication started gentle hydration. Continue monitor his vitals.  already consulted GI for possible endoscopy      Code Status: full Family Communication: family updated about patient's clinical progress Disposition Plan:  Endoscopy today   Brief narrative: 79 y.o. male with past medical history of chronic renal disease stage IV currently not on dialysis, past medical history of anemia secondary to chronic renal disease on Epogen, hypertension, also with past medical history of GI bleed in  2007 comes in for feeling weak for the last 2 weeks and confusion 2 days prior to admission. He had a routine CBC at this M.D. office which showed a hemoglobin of 6.3. So he was sent by his primary care Dr. to the emergency room here in the emergency room his hemoglobin is 4. He denies any change in his stools, he is on chronic iron therapy so they are always black. He has been taking aspirin and NSAIDs for osteoarthritis of the hand.  He relates his confusion was intermittent it has not gotten worse. He relates no recent falls, chest pain shortness of breath nausea vomiting fever or diarrhea. He also denies abdominal pain  Here in the ED he was started on 2 units of packed red blood cells and given Protonix. And we were asked to consult and further evaluate.   Consultants: Theda Belfast, MD   Procedures:  Endoscopy  Antibiotics:  None  HPI/Subjective: No bleeding overnight  Objective: Filed Vitals:   10/02/12 2116 10/02/12 2215 10/02/12 2310 10/03/12 0601  BP: 163/69 162/72 166/71 155/67  Pulse: 89 84 87 84  Temp: 98 F (36.7 C) 98.8 F (37.1 C) 97.5 F (36.4 C) 98 F (36.7 C)  TempSrc: Oral Oral Oral Oral  Resp: 18 20 20 20   Height:      Weight:      SpO2:    100%    Intake/Output Summary (Last 24 hours) at 10/03/12 0755 Last data filed at 10/03/12 0602  Gross per 24 hour  Intake 1462.5 ml  Output  850 ml  Net  612.5 ml    Exam:  HENT:  Head: Atraumatic.  Nose: Nose normal.  Mouth/Throat: Oropharynx is clear and moist.  Eyes: Conjunctivae are normal. Pupils are equal, round, and reactive to light. No scleral icterus.  Neck: Neck supple. No tracheal deviation present.  Cardiovascular: Normal rate, regular rhythm, normal heart sounds and intact distal pulses.  Pulmonary/Chest: Effort normal and breath sounds normal. No respiratory distress.  Abdominal: Soft. Normal appearance and bowel sounds are normal. She exhibits no distension. There is no tenderness.    Musculoskeletal: She exhibits no edema and no tenderness.  Neurological: She is alert. No cranial nerve deficit.    Data Reviewed: Basic Metabolic Panel:  Lab 10/03/12 1610 10/02/12 1327 10/02/12 1130  NA 142 139 140  K 4.4 4.6 4.2  CL 105 100 101  CO2 20 20 19   GLUCOSE 146* 155* 150*  BUN 118* 125* 125*  CREATININE 8.21* 8.50* 8.59*  CALCIUM 6.6* 6.9* 6.9*  MG -- -- --  PHOS 5.7* -- 5.3*    Liver Function Tests:  Lab 10/03/12 0610 10/02/12 1327 10/02/12 1130  AST -- 18 --  ALT -- 10 --  ALKPHOS -- 45 --  BILITOT -- 0.3 --  PROT -- 7.2 --  ALBUMIN 3.0* 3.5 3.5   No results found for this basename: LIPASE:5,AMYLASE:5 in the last 168 hours No results found for this basename: AMMONIA:5 in the last 168 hours  CBC:  Lab 10/03/12 0610 10/02/12 1327 10/02/12 1116  WBC 6.3 10.0 6.8  NEUTROABS 4.8 8.6* --  HGB 7.3* 4.1* 6.3*  HCT 21.8* 12.5* 19.1*  MCV 92.4 99.2 96.5  PLT 178 226 220    Cardiac Enzymes: No results found for this basename: CKTOTAL:5,CKMB:5,CKMBINDEX:5,TROPONINI:5 in the last 168 hours BNP (last 3 results) No results found for this basename: PROBNP:3 in the last 8760 hours   CBG: No results found for this basename: GLUCAP:5 in the last 168 hours  No results found for this or any previous visit (from the past 240 hour(s)).   Studies: No results found.  Scheduled Meds:   . sodium chloride   Intravenous STAT  . ferrous sulfate  325 mg Oral BID WC  . linagliptin  5 mg Oral Daily  . sodium chloride  3 mL Intravenous Q12H   Continuous Infusions:   . pantoprozole (PROTONIX) infusion 8 mg/hr (10/02/12 1633)    Active Problems:  Acute GI bleeding  AKI (acute kidney injury)  Chronic renal disease, stage IV  Hypertension  Acute blood loss anemia    Time spent: 40 minutes   [facility]  [facility] Pager [phone]. If 8PM-8AM, please contact night-coverage at [email or web address], password [facility] 10/03/2012, 7:55 AM  LOS: 1 day

## 2012-10-03 NOTE — Progress Notes (Signed)
Meet-and-greet with patient. Introduced patient to counseling services. Patient described his medical condition and his upcoming medical procedure.   Sherol Dade Counseling Intern Haroldine Laws

## 2012-10-04 ENCOUNTER — Encounter (HOSPITAL_COMMUNITY): Payer: Self-pay | Admitting: Gastroenterology

## 2012-10-04 ENCOUNTER — Encounter (HOSPITAL_COMMUNITY)
Admission: EM | Disposition: A | Discharge status: Home / Self Care | Payer: Self-pay | Source: Home / Self Care | Attending: Internal Medicine

## 2012-10-04 DIAGNOSIS — D62 Acute posthemorrhagic anemia: Secondary | ICD-10-CM

## 2012-10-04 HISTORY — PX: COLONOSCOPY: SHX5424

## 2012-10-04 LAB — BASIC METABOLIC PANEL
BUN: 98 mg/dL — ABNORMAL HIGH (ref 6–23)
GFR calc Af Amer: 7 mL/min — ABNORMAL LOW (ref >90)
GFR calc non Af Amer: 6 mL/min — ABNORMAL LOW (ref >90)
Potassium: 4.8 mEq/L (ref 3.5–5.1)
Sodium: 142 mEq/L (ref 135–145)

## 2012-10-04 LAB — CBC
MCH: 30.8 pg (ref 26.0–34.0)
MCHC: 33.2 g/dL (ref 30.0–36.0)
MCV: 92.9 fL (ref 78.0–100.0)
Platelets: 178 10*3/uL (ref 150–400)
RBC: 2.4 MIL/uL — ABNORMAL LOW (ref 4.22–5.81)
RDW: 19 % — ABNORMAL HIGH (ref 11.5–15.5)

## 2012-10-04 SURGERY — COLONOSCOPY
Anesthesia: Moderate Sedation

## 2012-10-04 MED ORDER — MIDAZOLAM HCL 5 MG/5ML IJ SOLN
INTRAMUSCULAR | Status: DC | PRN
  Administered 2012-10-04 (×3): 2 mg via INTRAVENOUS

## 2012-10-04 MED ORDER — FENTANYL CITRATE 0.05 MG/ML IJ SOLN
INTRAMUSCULAR | Status: AC
  Filled 2012-10-04: qty 2

## 2012-10-04 MED ORDER — FENTANYL CITRATE 0.05 MG/ML IJ SOLN
INTRAMUSCULAR | Status: DC | PRN
  Administered 2012-10-04 (×2): 25 ug via INTRAVENOUS

## 2012-10-04 MED ORDER — MIDAZOLAM HCL 5 MG/ML IJ SOLN
INTRAMUSCULAR | Status: AC
  Filled 2012-10-04: qty 2

## 2012-10-04 MED ORDER — SODIUM CHLORIDE 0.9 % IV SOLN: INTRAVENOUS | Status: DC

## 2012-10-04 NOTE — Consult Note (Signed)
Pt. Feels improved

## 2012-10-04 NOTE — H&P (View-Only) (Signed)
Pt completed golightly bowel prep. Pt's bm is bright red colored. Pt's vs are asymptomatic except BP elevated in 170's systolic. HgB increased to 7.4 from admission HgB of 4.1. Will continue to monitor. MD made aware.

## 2012-10-04 NOTE — Consult Note (Signed)
  Pt feels much after getting 2 units of packed cells. G  I  Dr Elnoria Howard found several  Bleeding areas in his G.I. Tract. Pts creatine 8.5. Today, it is 7.9 I will follow along.

## 2012-10-04 NOTE — Interval H&P Note (Signed)
History and Physical Interval Note:  10/04/2012 9:01 AM  [name]  has presented today for surgery, with the diagnosis of Anemia and Heme positive stool  The various methods of treatment have been discussed with the patient and family. After consideration of risks, benefits and other options for treatment, the patient has consented to  Procedure(s) (LRB) with comments: COLONOSCOPY (N/A) as a surgical intervention .  The patient's history has been reviewed, patient examined, no change in status, stable for surgery.  I have reviewed the patient's chart and labs.  Questions were answered to the patient's satisfaction.     Mairen Wallenstein D

## 2012-10-04 NOTE — Progress Notes (Signed)
Physical Therapy Treatment Patient Details Name: [name] MRN: [id number] DOB: 12/04/1931 Today's Date: 10/04/2012 Time: 4098-1191 PT Time Calculation (min): 25 min  PT Assessment / Plan / Recommendation Comments on Treatment Session  Progressing with balance and activity tolerance.  Still stiff and needs UE support for ambulation.  Encouraged to use walker at home and participate with HHPT to improve balance and safety to return to cane use safely.  Also educated in safety with rising slowly with sitting several moments prior to standing.    Follow Up Recommendations  Home health PT           Equipment Recommendations  None recommended by PT       Frequency Min 3X/week   Plan Discharge plan remains appropriate    Precautions / Restrictions Precautions Precautions: Fall   Pertinent Vitals/Pain Denies pain    Mobility  Bed Mobility Supine to Sit: 6: Modified independent (Device/Increase time) Transfers Sit to Stand: 5: Supervision;With upper extremity assist;From bed Stand to Sit: 5: Supervision;Without upper extremity assist;To bed;To chair/3-in-1 Details for Transfer Assistance: initially popped up quickly; educated in need to sit first few moments prior to standing. Ambulation/Gait Ambulation/Gait Assistance: 4: Min guard Ambulation Distance (Feet): 350 Feet Assistive device: Rolling walker Ambulation/Gait Assistance Details: cues for proximity to walker and to slow down Gait Pattern: Step-through pattern;Trunk flexed      PT Goals Acute Rehab PT Goals Pt will go Sit to Stand: with modified independence PT Goal: Sit to Stand - Progress: Progressing toward goal Pt will go Stand to Sit: with modified independence PT Goal: Stand to Sit - Progress: Progressing toward goal Pt will Stand: with modified independence;3 - 5 min;with unilateral upper extremity support PT Goal: Stand - Progress: Progressing toward goal Pt will Ambulate: >150 feet;with modified  independence;with least restrictive assistive device PT Goal: Ambulate - Progress: Progressing toward goal  Visit Information  Last PT Received On: 10/04/12    Subjective Data  Subjective: Still stiff from being in bed.  Feel better today.   Cognition  Overall Cognitive Status: Appears within functional limits for tasks assessed/performed Arousal/Alertness: Awake/alert Orientation Level: Appears intact for tasks assessed Behavior During Session: [name] for tasks performed    Balance  Static Standing Balance Static Standing - Balance Support: No upper extremity supported;During functional activity (donning gown) Static Standing - Level of Assistance: 4: Min assist Static Standing - Comment/# of Minutes: no loss of balance, but provided min assist for safety  End of Session PT - End of Session Equipment Utilized During Treatment: Gait belt Activity Tolerance: Patient tolerated treatment well Patient left: in chair;with call bell/phone within reach   GP     [facility] 10/04/2012, 4:09 PM [place], [name] [phone] 10/04/2012

## 2012-10-04 NOTE — Op Note (Signed)
[facility] [address] [place], [address]   OPERATIVE PROCEDURE REPORT  PATIENT: [name], [name]  MR#: [id number] BIRTHDATE: 01/08/1932  GENDER: Male ENDOSCOPIST: Jeani Hawking, MD ASSISTANT:   Karie Soda, technician Jeneen Montgomery, RN PROCEDURE DATE: 10/04/2012 PROCEDURE:   Colonoscopy with tissue ablation ASA CLASS:   Class III INDICATIONS:Iron Deficiency Anemia and Heme positive stool. MEDICATIONS: Versed 6 mg IV and Fentanyl 75 mcg IV  DESCRIPTION OF PROCEDURE:   After the risks benefits and alternatives of the procedure were thoroughly explained, informed consent was obtained.  A digital rectal exam revealed no abnormalities of the rectum.    The Pentax Colonoscope [id number] endoscope was introduced through the anus  and advanced to the cecum, which was identified by both the appendix and ileocecal valve , No adverse events experienced.    The quality of the prep was excellent. .  The instrument was then slowly withdrawn as the colon was fully examined.  COLON FINDINGS: In the ascending colon two small minimally oozing AVMs and two larger nonbleeding AVMs were identified.  Positioning was difficult, but the lesions were all cauterized with APC.  The second largest AVM continued to ooze after APC and a hemoclip was deployed, which arrested the bleeding.  A hepatic flexure AVM was identified and cauterized with APC as well as a distal transverse colon AVM.  The hepatic flexure polyp was mildly oozing at the time of cauteery.  Scattered diverticula were identified, but the highest concentration was in the left side of the colon. Retroflexed views revealed internal/external hemorrhoids.     The scope was then withdrawn from the patient and the procedure terminated.  Total procedure time was 50 minutes.  COMPLICATIONS: There were no complications.  IMPRESSION: 1) AVMs s/p APC. 2) Diverticula.  RECOMMENDATIONS: 1) Advance diet. 2) Follow HGB. 3)  If stable tomorrow, he can be discharged home with a 2 week follow up in the office.   _______________________________ eSigned:  Jeani Hawking, MD 10/04/2012 10:21 AM   PATIENT NAME:  [name], [name] MR#: [id number]

## 2012-10-04 NOTE — Progress Notes (Signed)
[facility] PROGRESS NOTE  Lathaniel Legate ZOX:[id number] DOB: Aug 15, 1932 DOA: 10/02/2012 PCP: Jeri Cos, MD  Assessment/Plan: 1. Lower gi bleed - s/p colonoscopy 1/29 (full report below). Will monitor CBC today and tomorrow and transfuse with goal Hb >7 2. Acute on chronic renal failure (stage IV) Outpatient Cr ~5-6, here up to 8 and now improving. Likely worsening secondary to hypovolemia/acute anemia. 3. Acute anemia - secondary to #1. Monitoring CBC. No chest pain or breathing difficulties.   Code Status: Full Family Communication: none (indicate person spoken with, relationship, and if by phone, the number) Disposition Plan: Home 1-2 days provided stable HH and no evidence of bleeding.   Consultants:  Gastroenterology  Procedures:  EGD on 10/03/2012 FINDINGs: A small sliding hiatal hernia was identified. No evidence  of any esophagitis during this examination. A mild inflammation  was noted at the pylorus. In the second part of the duodenum 4  very small nonbleeding AVMs were identified and ablated. I do not  feel that these are the sources of his anemia. Retroflexed views  revealed no abnormalities. The scope was then withdrawn from  the patient and the procedure terminated.   CSY 10/04/2012 COLON FINDINGS: In the ascending colon two small minimally oozing  AVMs and two larger nonbleeding AVMs were identified. Positioning  was difficult, but the lesions were all cauterized with APC. The  second largest AVM continued to ooze after APC and a hemoclip was  deployed, which arrested the bleeding. A hepatic flexure AVM was  identified and cauterized with APC as well as a distal transverse  colon AVM. The hepatic flexure polyp was mildly oozing at the time  of cauteery. Scattered diverticula were identified, but the  highest concentration was in the left side of the colon.  Retroflexed views revealed internal/external hemorrhoids. The  scope was then withdrawn from the  patient and the procedure  terminated. Total procedure time was 50 minutes.   Antibiotics:  none   HPI/Subjective: No complaints this morning, states that he tolerated the CSY well.   Objective: Filed Vitals:   10/04/12 1000 10/04/12 1005 10/04/12 1014 10/04/12 1039  BP: 164/80 159/73  170/135  Pulse:      Temp:      TempSrc:      Resp: 13 13 26  35  Height:      Weight:      SpO2: 100% 100% 100% 98%    Intake/Output Summary (Last 24 hours) at 10/04/12 1158 Last data filed at 10/04/12 1033  Gross per 24 hour  Intake 1002.5 ml  Output      0 ml  Net 1002.5 ml   Filed Weights   10/02/12 2016 10/03/12 2154  Weight: 92.579 kg (204 lb 1.6 oz) 96.571 kg (212 lb 14.4 oz)    Exam:   General:  He is in no acute distress  Cardiovascular: Regular rate and rhythm, no murmurs  Respiratory: Clear to auscultation bilaterally  Abdomen: Soft, nontender to palpation  Data Reviewed: Basic Metabolic Panel:  Lab 10/04/12 8119 10/03/12 0610 10/02/12 1327 10/02/12 1130  NA 142 142 139 140  K 4.8 4.4 4.6 4.2  CL 106 105 100 101  CO2 19 20 20 19   GLUCOSE 106* 146* 155* 150*  BUN 98* 118* 125* 125*  CREATININE 7.93* 8.21* 8.50* 8.59*  CALCIUM 6.8* 6.6* 6.9* 6.9*  MG -- -- -- --  PHOS -- 5.7* -- 5.3*   Liver Function Tests:  Lab 10/03/12 0610 10/02/12 1327 10/02/12 1130  AST --  18 --  ALT -- 10 --  ALKPHOS -- 45 --  BILITOT -- 0.3 --  PROT -- 7.2 --  ALBUMIN 3.0* 3.5 3.5   CBC:  Lab 10/03/12 0610 10/02/12 1327 10/02/12 1116  WBC 6.3 10.0 6.8  NEUTROABS 4.8 8.6* --  HGB 7.3* 4.1* 6.3*  HCT 21.8* 12.5* 19.1*  MCV 92.4 99.2 96.5  PLT 178 226 220   CBG:  Lab 10/04/12 0844 10/03/12 2129 10/03/12 1701 10/03/12 1341  GLUCAP 89 117* 149* 152*    Scheduled Meds:   . ferrous sulfate  325 mg Oral BID WC  . linagliptin  5 mg Oral Daily  . sodium chloride  3 mL Intravenous Q12H   Continuous Infusions:   . sodium chloride 50 mL/hr at 10/03/12 1357  . pantoprozole  (PROTONIX) infusion 8 mg/hr (10/03/12 1030)    Active Problems:  Acute GI bleeding  AKI (acute kidney injury)  Chronic renal disease, stage IV  Hypertension  Acute blood loss anemia   [name]  [facility] Pager [phone]. If 7PM-7AM, please contact night-coverage at [email or web address], password [facility] 10/04/2012, 11:58 AM  LOS: 2 days

## 2012-10-05 ENCOUNTER — Encounter (HOSPITAL_COMMUNITY): Payer: Self-pay | Admitting: Gastroenterology

## 2012-10-05 LAB — CBC
HCT: 21.9 % — ABNORMAL LOW (ref 39.0–52.0)
MCH: 30.7 pg (ref 26.0–34.0)
MCHC: 32.4 g/dL (ref 30.0–36.0)
MCHC: 32.5 g/dL (ref 30.0–36.0)
Platelets: 178 10*3/uL (ref 150–400)
RBC: 2.54 MIL/uL — ABNORMAL LOW (ref 4.22–5.81)
RDW: 18.5 % — ABNORMAL HIGH (ref 11.5–15.5)

## 2012-10-05 LAB — BASIC METABOLIC PANEL
BUN: 91 mg/dL — ABNORMAL HIGH (ref 6–23)
Chloride: 107 mEq/L (ref 96–112)
Creatinine, Ser: 7.68 mg/dL — ABNORMAL HIGH (ref 0.50–1.35)
GFR calc Af Amer: 7 mL/min — ABNORMAL LOW (ref >90)

## 2012-10-05 MED ORDER — ALLOPURINOL 100 MG PO TABS
100.0000 mg | ORAL_TABLET | ORAL | Status: DC
  Administered 2012-10-05: 100 mg via ORAL
  Filled 2012-10-05 (×2): qty 1

## 2012-10-05 NOTE — Progress Notes (Signed)
Subjective: No acute events.  The patient feels well.  No reports of melena or hematochezia.  Objective: Vital signs in last 24 hours: Temp:  [98.1 F (36.7 C)-98.6 F (37 C)] 98.2 F (36.8 C) (01/30 1340) Pulse Rate:  [80-97] 85  (01/30 1340) Resp:  [20] 20  (01/30 1340) BP: (124-171)/(55-78) 124/61 mmHg (01/30 1340) SpO2:  [98 %-100 %] 100 % (01/30 1340) Weight:  [212 lb 14.1 oz (96.562 kg)] 212 lb 14.1 oz (96.562 kg) (01/29 2041) Last BM Date: 10/04/12  Intake/Output from previous day: 01/29 0701 - 01/30 0700 In: 1710 [P.O.:1260; I.V.:450] Out: 1025 [Urine:1025] Intake/Output this shift: Total I/O In: 600 [P.O.:600] Out: 525 [Urine:525]  General appearance: alert and no distress GI: soft, non-tender; bowel sounds normal; no masses,  no organomegaly Extremities: edematous right hand  Lab Results:  Basename 10/05/12 0640 10/04/12 1135 10/03/12 0610  WBC 6.1 6.5 6.3  HGB 7.1* 7.4* 7.3*  HCT 21.9* 22.3* 21.8*  PLT 175 178 178   BMET  Basename 10/05/12 0640 10/04/12 0615 10/03/12 0610  NA 140 142 142  K 4.7 4.8 4.4  CL 107 106 105  CO2 19 19 20   GLUCOSE 119* 106* 146*  BUN 91* 98* 118*  CREATININE 7.68* 7.93* 8.21*  CALCIUM 7.0* 6.8* 6.6*   LFT  Basename 10/03/12 0610  PROT --  ALBUMIN 3.0*  AST --  ALT --  ALKPHOS --  BILITOT --  BILIDIR --  IBILI --   PT/INR  Basename 10/03/12 0022  LABPROT 14.6  INR 1.16   Hepatitis Panel No results found for this basename: HEPBSAG,HCVAB,HEPAIGM,HEPBIGM in the last 72 hours C-Diff No results found for this basename: CDIFFTOX:3 in the last 72 hours Fecal Lactopherrin No results found for this basename: FECLLACTOFRN in the last 72 hours  Studies/Results: No results found.  Medications:  Scheduled:   . ferrous sulfate  325 mg Oral BID WC  . linagliptin  5 mg Oral Daily  . sodium chloride  3 mL Intravenous Q12H   Continuous:   . sodium chloride 50 mL/hr at 10/03/12 1357  . pantoprozole (PROTONIX)  infusion 8 mg/hr (10/05/12 0157)    Assessment/Plan: 1) Colonic AVMs.   2) CKD.   The patient is stable from the GI standpoint.  His HGB seems to be stable.  The AVMs were cauterized with APC.    Plan: 1) Monitor HGB. 2) Follow up in 2 weeks. 3) Signing off.  LOS: 3 days   Kalyani Maeda D 10/05/2012, 5:17 PM

## 2012-10-05 NOTE — Consult Note (Signed)
  Pt eating .he feels well.  Uric acid  9.1 may add a little  Allopurinol 100mg   mon  And  thursday

## 2012-10-05 NOTE — Progress Notes (Addendum)
[facility] PROGRESS NOTE  [name] JWJ:[id number] DOB: 04/06/1932 DOA: 10/02/2012 PCP: Jeri Cos, MD  Assessment/Plan: 1. Lower gi bleed - s/p colonoscopy 1/29 (full report below). CBC stable a bit down today. Will recheck tonight and tomorrow am. No BM yet.  2. Acute on chronic renal failure (stage IV) Outpatient Cr ~5-6, here up to 8 and now improving. Likely worsening secondary to hypovolemia/acute anemia. 3. Acute anemia - secondary to #1. Monitoring CBC. No chest pain or breathing difficulties.   Code Status: Full Family Communication: none (indicate person spoken with, relationship, and if by phone, the number) Disposition Plan: Home tomorrow pending HH stability and no bloody BMs.   Consultants:  Gastroenterology  Procedures:  EGD on 10/03/2012 FINDINGs: A small sliding hiatal hernia was identified. No evidence  of any esophagitis during this examination. A mild inflammation  was noted at the pylorus. In the second part of the duodenum 4  very small nonbleeding AVMs were identified and ablated. I do not  feel that these are the sources of his anemia. Retroflexed views  revealed no abnormalities. The scope was then withdrawn from  the patient and the procedure terminated.   CSY 10/04/2012 COLON FINDINGS: In the ascending colon two small minimally oozing  AVMs and two larger nonbleeding AVMs were identified. Positioning  was difficult, but the lesions were all cauterized with APC. The  second largest AVM continued to ooze after APC and a hemoclip was  deployed, which arrested the bleeding. A hepatic flexure AVM was  identified and cauterized with APC as well as a distal transverse  colon AVM. The hepatic flexure polyp was mildly oozing at the time  of cauteery. Scattered diverticula were identified, but the  highest concentration was in the left side of the colon.  Retroflexed views revealed internal/external hemorrhoids. The  scope was then withdrawn from  the patient and the procedure  terminated. Total procedure time was 50 minutes.   Antibiotics:  none   HPI/Subjective: - comfortable, eating breakfast, feels much better today.   Objective: Filed Vitals:   10/04/12 1751 10/04/12 2041 10/05/12 0544 10/05/12 0901  BP: 141/74 160/64 171/78 130/55  Pulse: 97 85 87 80  Temp: 98.2 F (36.8 C) 98.1 F (36.7 C) 98.6 F (37 C) 98.4 F (36.9 C)  TempSrc: Oral Oral Oral Oral  Resp: 20 20 20    Height:  6' (1.829 m)    Weight:  96.562 kg (212 lb 14.1 oz)    SpO2: 98% 100% 100% 100%    Intake/Output Summary (Last 24 hours) at 10/05/12 1150 Last data filed at 10/05/12 0830  Gross per 24 hour  Intake   1260 ml  Output   1050 ml  Net    210 ml   Filed Weights   10/02/12 2016 10/03/12 2154 10/04/12 2041  Weight: 92.579 kg (204 lb 1.6 oz) 96.571 kg (212 lb 14.4 oz) 96.562 kg (212 lb 14.1 oz)    Exam:   General:  He is in no acute distress  Cardiovascular: Regular rate and rhythm, no murmurs  Respiratory: Clear to auscultation bilaterally  Abdomen: Soft, nontender to palpation  Data Reviewed: Basic Metabolic Panel:  Lab 10/05/12 6213 10/04/12 0615 10/03/12 0610 10/02/12 1327 10/02/12 1130  NA 140 142 142 139 140  K 4.7 4.8 4.4 4.6 4.2  CL 107 106 105 100 101  CO2 19 19 20 20 19   GLUCOSE 119* 106* 146* 155* 150*  BUN 91* 98* 118* 125* 125*  CREATININE 7.68*  7.93* 8.21* 8.50* 8.59*  CALCIUM 7.0* 6.8* 6.6* 6.9* 6.9*  MG -- -- -- -- --  PHOS -- -- 5.7* -- 5.3*   Liver Function Tests:  Lab 10/03/12 0610 10/02/12 1327 10/02/12 1130  AST -- 18 --  ALT -- 10 --  ALKPHOS -- 45 --  BILITOT -- 0.3 --  PROT -- 7.2 --  ALBUMIN 3.0* 3.5 3.5   CBC:  Lab 10/05/12 0640 10/04/12 1135 10/03/12 0610 10/02/12 1327 10/02/12 1116  WBC 6.1 6.5 6.3 10.0 6.8  NEUTROABS -- -- 4.8 8.6* --  HGB 7.1* 7.4* 7.3* 4.1* 6.3*  HCT 21.9* 22.3* 21.8* 12.5* 19.1*  MCV 93.6 92.9 92.4 99.2 96.5  PLT 175 178 178 226 220   CBG:  Lab 10/04/12  0844 10/03/12 2129 10/03/12 1701 10/03/12 1341  GLUCAP 89 117* 149* 152*    Scheduled Meds:    . ferrous sulfate  325 mg Oral BID WC  . linagliptin  5 mg Oral Daily  . sodium chloride  3 mL Intravenous Q12H   Continuous Infusions:    . sodium chloride 50 mL/hr at 10/03/12 1357  . pantoprozole (PROTONIX) infusion 8 mg/hr (10/05/12 0157)    Active Problems:  Acute GI bleeding  AKI (acute kidney injury)  Chronic renal disease, stage IV  Hypertension  Acute blood loss anemia   [name]  [facility] Pager [phone]. If 7PM-7AM, please contact night-coverage at [email or web address], password [facility] 10/05/2012, 11:50 AM  LOS: 3 days

## 2012-10-06 LAB — RENAL FUNCTION PANEL
CO2: 20 mEq/L (ref 19–32)
Calcium: 6.7 mg/dL — ABNORMAL LOW (ref 8.4–10.5)
GFR calc Af Amer: 7 mL/min — ABNORMAL LOW (ref >90)
GFR calc non Af Amer: 6 mL/min — ABNORMAL LOW (ref >90)
Phosphorus: 6.3 mg/dL — ABNORMAL HIGH (ref 2.3–4.6)
Sodium: 137 mEq/L (ref 135–145)

## 2012-10-06 LAB — PREPARE RBC (CROSSMATCH)

## 2012-10-06 LAB — CBC
MCHC: 32.7 g/dL (ref 30.0–36.0)
RDW: 18.5 % — ABNORMAL HIGH (ref 11.5–15.5)

## 2012-10-06 MED ORDER — PANTOPRAZOLE SODIUM 20 MG PO TBEC
20.0000 mg | DELAYED_RELEASE_TABLET | Freq: Two times a day (BID) | ORAL | Status: DC
  Administered 2012-10-06 – 2012-10-08 (×5): 20 mg via ORAL
  Filled 2012-10-06 (×6): qty 1

## 2012-10-06 MED ORDER — POLYETHYLENE GLYCOL 3350 17 G PO PACK
17.0000 g | PACK | Freq: Every day | ORAL | Status: DC
  Administered 2012-10-06 – 2012-10-07 (×2): 17 g via ORAL
  Filled 2012-10-06 (×3): qty 1

## 2012-10-06 NOTE — Progress Notes (Signed)
Physical Therapy Treatment Patient Details Name: [name] MRN: [id number] DOB: [birth year] Today's Date: 10/06/2012 Time: 4098-1191 PT Time Calculation (min): 25 min  PT Assessment / Plan / Recommendation Comments on Treatment Session  Progress continues with balance and activity tolerance. Agree with need for HHPT.    Follow Up Recommendations  Home health PT     Does the patient have the potential to tolerate intense rehabilitation     Barriers to Discharge        Equipment Recommendations  None recommended by PT    Recommendations for Other Services    Frequency Min 3X/week   Plan Discharge plan remains appropriate    Precautions / Restrictions Precautions Precautions: Fall Restrictions Weight Bearing Restrictions: No   Pertinent Vitals/Pain     Mobility  Bed Mobility Bed Mobility: Not assessed Transfers Transfers: Sit to Stand;Stand to Sit Sit to Stand: 5: Supervision;With upper extremity assist;With armrests;From chair/3-in-1 Stand to Sit: 5: Supervision;With upper extremity assist;With armrests;To chair/3-in-1 Details for Transfer Assistance: Verbal cues for hand placement during transfers.  Ambulation/Gait Ambulation/Gait Assistance: 4: Min guard Ambulation Distance (Feet): 380 Feet Assistive device: Rolling walker Ambulation/Gait Assistance Details: Flexed posture with RW too far ahead of patient.  Verbal cues to stand upright and stay close to RW.  Encouraged patient to minimize weightbearing through UE's to keep RW from moving too fast. Gait Pattern: Step-through pattern;Trunk flexed     PT Goals Acute Rehab PT Goals PT Goal: Sit to Stand - Progress: Progressing toward goal PT Goal: Stand to Sit - Progress: Progressing toward goal PT Goal: Ambulate - Progress: Progressing toward goal  Visit Information  Last PT Received On: 10/06/12 Assistance Needed: +1    Subjective Data  Subjective: "I'm doing pretty good today"   Cognition  Overall  Cognitive Status: Appears within functional limits for tasks assessed/performed Arousal/Alertness: Awake/alert Orientation Level: Appears intact for tasks assessed Behavior During Session: [facility] for tasks performed    Balance     End of Session PT - End of Session Equipment Utilized During Treatment: Gait belt Activity Tolerance: Patient tolerated treatment well Patient left: in chair;with call bell/phone within reach Nurse Communication: Mobility status   GP     Vena Austria 10/06/2012, 5:28 PM Durenda Hurt. Renaldo Fiddler, [facility] Acute Rehab Services Pager [phone]

## 2012-10-06 NOTE — Progress Notes (Signed)
CRITICAL VALUE ALERT  Critical value received:  HGB=6.7  Date of notification:  10/06/12  Time of notification:  0709  Critical value read back:yes  Nurse who received alert:  Minna Merritts

## 2012-10-06 NOTE — Progress Notes (Signed)
[facility] PROGRESS NOTE  Talbert Trembath ZOX:[id number] DOB: 1932/02/07 DOA: 10/02/2012 PCP: Jeri Cos, MD  Assessment/Plan: 1. Lower gi bleed - s/p colonoscopy 1/29 (full report below). Will monitor CBC today and tomorrow and transfuse with goal Hb >7. Hemoglobin less than 7 this morning. Patient denies bloody stools he in fact has yet to have a BM since his colonoscopy. Reports that when he woke up this morning his IV was out and there was significant amount of blood on the bed sheets. Could not appreciate amount. I suspect that this might not fully explain his HH drop. Will transfuse 1U pRBC this morning and monitor CBC tomorrow. Will closely watch for evidence of gi bleeding.  2. Acute on chronic renal failure (stage IV) Outpatient Cr ~5-6, here up to 8 and now improving. Likely worsening secondary to hypovolemia/acute anemia.  3. Acute anemia - secondary to #1. Monitoring CBC. No chest pain or breathing difficulties.   Code Status: Full Family Communication: none (indicate person spoken with, relationship, and if by phone, the number) Disposition Plan: Home 1-2 days provided stable HH and no evidence of bleeding.   Consultants:  Gastroenterology  Procedures:  EGD on 10/03/2012 FINDINGs: A small sliding hiatal hernia was identified. No evidence  of any esophagitis during this examination. A mild inflammation  was noted at the pylorus. In the second part of the duodenum 4  very small nonbleeding AVMs were identified and ablated. I do not  feel that these are the sources of his anemia. Retroflexed views  revealed no abnormalities. The scope was then withdrawn from  the patient and the procedure terminated.   CSY 10/04/2012 COLON FINDINGS: In the ascending colon two small minimally oozing  AVMs and two larger nonbleeding AVMs were identified. Positioning  was difficult, but the lesions were all cauterized with APC. The  second largest AVM continued to ooze after APC and a  hemoclip was  deployed, which arrested the bleeding. A hepatic flexure AVM was  identified and cauterized with APC as well as a distal transverse  colon AVM. The hepatic flexure polyp was mildly oozing at the time  of cauteery. Scattered diverticula were identified, but the  highest concentration was in the left side of the colon.  Retroflexed views revealed internal/external hemorrhoids. The  scope was then withdrawn from the patient and the procedure  terminated. Total procedure time was 50 minutes.   Antibiotics:  none   HPI/Subjective: No complaints this morning, ate well. Did report the bleeding.   Objective: Filed Vitals:   10/06/12 0922 10/06/12 1100 10/06/12 1130 10/06/12 1230  BP: 169/69 166/64 159/65 151/56  Pulse: 82 82 77 80  Temp: 98.2 F (36.8 C) 98.8 F (37.1 C) 98.7 F (37.1 C) 97.8 F (36.6 C)  TempSrc:  Oral Oral Oral  Resp: 18 20 18 20   Height:      Weight:      SpO2: 100%       Intake/Output Summary (Last 24 hours) at 10/06/12 1312 Last data filed at 10/06/12 8119  Gross per 24 hour  Intake    960 ml  Output   2550 ml  Net  -1590 ml   Filed Weights   10/03/12 2154 10/04/12 2041 10/05/12 2142  Weight: 96.571 kg (212 lb 14.4 oz) 96.562 kg (212 lb 14.1 oz) 96.8 kg (213 lb 6.5 oz)    Exam:   General:  He is in no acute distress  Cardiovascular: Regular rate and rhythm, no murmurs  Respiratory: Clear  to auscultation bilaterally  Abdomen: Soft, nontender to palpation  Data Reviewed: Basic Metabolic Panel:  Lab 10/06/12 1610 10/05/12 0640 10/04/12 0615 10/03/12 0610 10/02/12 1327 10/02/12 1130  NA 137 140 142 142 139 --  K 5.1 4.7 4.8 4.4 4.6 --  CL 106 107 106 105 100 --  CO2 20 19 19 20 20  --  GLUCOSE 116* 119* 106* 146* 155* --  BUN 86* 91* 98* 118* 125* --  CREATININE 7.29* 7.68* 7.93* 8.21* 8.50* --  CALCIUM 6.7* 7.0* 6.8* 6.6* 6.9* --  MG -- -- -- -- -- --  PHOS 6.3* -- -- 5.7* -- 5.3*   Liver Function Tests:  Lab 10/06/12  0610 10/03/12 0610 10/02/12 1327 10/02/12 1130  AST -- -- 18 --  ALT -- -- 10 --  ALKPHOS -- -- 45 --  BILITOT -- -- 0.3 --  PROT -- -- 7.2 --  ALBUMIN 2.8* 3.0* 3.5 3.5   CBC:  Lab 10/06/12 0610 10/05/12 1755 10/05/12 0640 10/04/12 1135 10/03/12 0610 10/02/12 1327  WBC 5.8 6.7 6.1 6.5 6.3 --  NEUTROABS -- -- -- -- 4.8 8.6*  HGB 6.7* 7.8* 7.1* 7.4* 7.3* --  HCT 20.5* 24.0* 21.9* 22.3* 21.8* --  MCV 93.2 94.5 93.6 92.9 92.4 --  PLT 163 178 175 178 178 --   CBG:  Lab 10/04/12 0844 10/03/12 2129 10/03/12 1701 10/03/12 1341  GLUCAP 89 117* 149* 152*    Scheduled Meds:    . allopurinol  100 mg Oral 2 times weekly  . ferrous sulfate  325 mg Oral BID WC  . linagliptin  5 mg Oral Daily  . sodium chloride  3 mL Intravenous Q12H   Continuous Infusions:    . sodium chloride 50 mL/hr at 10/03/12 1357  . pantoprozole (PROTONIX) infusion 8 mg/hr (10/06/12 0317)   Active Problems:  Acute GI bleeding  AKI (acute kidney injury)  Chronic renal disease, stage IV  Hypertension  Acute blood loss anemia  [name]  [facility] Pager [phone]. If 7PM-7AM, please contact night-coverage at [email or web address], password [facility] 10/06/2012, 1:12 PM  LOS: 4 days

## 2012-10-07 DIAGNOSIS — E875 Hyperkalemia: Secondary | ICD-10-CM

## 2012-10-07 LAB — CBC
MCH: 31.2 pg (ref 26.0–34.0)
Platelets: 168 10*3/uL (ref 150–400)
RBC: 2.63 MIL/uL — ABNORMAL LOW (ref 4.22–5.81)
RDW: 18 % — ABNORMAL HIGH (ref 11.5–15.5)
WBC: 7.2 10*3/uL (ref 4.0–10.5)

## 2012-10-07 LAB — TYPE AND SCREEN: ABO/RH(D): AB POS

## 2012-10-07 LAB — RENAL FUNCTION PANEL
Albumin: 2.9 g/dL — ABNORMAL LOW (ref 3.5–5.2)
Chloride: 107 mEq/L (ref 96–112)
Creatinine, Ser: 7.38 mg/dL — ABNORMAL HIGH (ref 0.50–1.35)
GFR calc non Af Amer: 6 mL/min — ABNORMAL LOW (ref >90)
Potassium: 5.9 mEq/L — ABNORMAL HIGH (ref 3.5–5.1)

## 2012-10-07 LAB — BASIC METABOLIC PANEL
CO2: 19 mEq/L (ref 19–32)
Calcium: 7.1 mg/dL — ABNORMAL LOW (ref 8.4–10.5)
GFR calc Af Amer: 7 mL/min — ABNORMAL LOW (ref >90)
Sodium: 138 mEq/L (ref 135–145)

## 2012-10-07 MED ORDER — SODIUM POLYSTYRENE SULFONATE 15 GM/60ML PO SUSP
30.0000 g | Freq: Once | ORAL | Status: AC
  Administered 2012-10-07: 30 g via ORAL
  Filled 2012-10-07 (×2): qty 60

## 2012-10-07 MED ORDER — AMLODIPINE BESYLATE 10 MG PO TABS
10.0000 mg | ORAL_TABLET | Freq: Every day | ORAL | Status: DC
  Administered 2012-10-07 – 2012-10-08 (×2): 10 mg via ORAL
  Filled 2012-10-07 (×2): qty 1

## 2012-10-07 NOTE — Consult Note (Signed)
  Pt. Feels much better. prob  DC  In AM will see in office in 2 weeks.

## 2012-10-07 NOTE — Progress Notes (Signed)
[facility] PROGRESS NOTE  [name] JYN:[id number] DOB: [birth year] DOA: 10/02/2012 PCP: Jeri Cos, MD  Assessment/Plan: 1. Lower gi bleed - s/p colonoscopy 1/29 (full report below). Will monitor CBC today and tomorrow and transfuse with goal Hb >7.  - low Hb 1/31, transfused 1 unit. Hb this morning much improved. Had on BM 1/31 without avidence of active bleeding.  2. Hyperkalemia - K elevated at 5.9 on 2/1. Patient endorses some dietary indiscretions, may be related to that. Kayexalate given.    3 Acute on chronic renal failure (stage IV) Outpatient Cr ~5-6, here up to 8 and now improving. Likely worsening secondary to hypovolemia/acute anemia.   4. Acute anemia - secondary to #1. Monitoring CBC. No chest pain or breathing difficulties.   Code Status: Full Family Communication: none Disposition Plan: home tomorrow pending K normalization.   Consultants:  Gastroenterology  Procedures:  EGD on 10/03/2012 FINDINGs: A small sliding hiatal hernia was identified. No evidence  of any esophagitis during this examination. A mild inflammation  was noted at the pylorus. In the second part of the duodenum 4  very small nonbleeding AVMs were identified and ablated. I do not  feel that these are the sources of his anemia. Retroflexed views  revealed no abnormalities. The scope was then withdrawn from  the patient and the procedure terminated.   CSY 10/04/2012 COLON FINDINGS: In the ascending colon two small minimally oozing  AVMs and two larger nonbleeding AVMs were identified. Positioning  was difficult, but the lesions were all cauterized with APC. The  second largest AVM continued to ooze after APC and a hemoclip was  deployed, which arrested the bleeding. A hepatic flexure AVM was  identified and cauterized with APC as well as a distal transverse  colon AVM. The hepatic flexure polyp was mildly oozing at the time  of cauteery. Scattered diverticula were identified,  but the  highest concentration was in the left side of the colon.  Retroflexed views revealed internal/external hemorrhoids. The  scope was then withdrawn from the patient and the procedure  terminated. Total procedure time was 50 minutes.   Antibiotics:  none   HPI/Subjective: - feeling great this morning  Objective: Filed Vitals:   10/06/12 1756 10/06/12 2234 10/07/12 0459 10/07/12 1408  BP: 175/75 170/67 172/69 169/71  Pulse: 85 80 82 82  Temp: 97.5 F (36.4 C) 98.5 F (36.9 C) 98.6 F (37 C) 97.5 F (36.4 C)  TempSrc:  Oral Oral Oral  Resp: 18 17 18 18   Height:      Weight:  97.297 kg (214 lb 8 oz)    SpO2: 100% 100% 100% 98%    Intake/Output Summary (Last 24 hours) at 10/07/12 1414 Last data filed at 10/07/12 1300  Gross per 24 hour  Intake   1100 ml  Output   1650 ml  Net   -550 ml   Filed Weights   10/04/12 2041 10/05/12 2142 10/06/12 2234  Weight: 96.562 kg (212 lb 14.1 oz) 96.8 kg (213 lb 6.5 oz) 97.297 kg (214 lb 8 oz)    Exam:   General:  He is in no acute distress  Cardiovascular: Regular rate and rhythm, no murmurs  Respiratory: Clear to auscultation bilaterally  Abdomen: Soft, nontender to palpation  Data Reviewed: Basic Metabolic Panel:  Lab 10/07/12 8657 10/06/12 0610 10/05/12 0640 10/04/12 0615 10/03/12 0610 10/02/12 1130  NA 138 137 140 142 142 --  K 5.9* 5.1 4.7 4.8 4.4 --  CL 107 106  107 106 105 --  CO2 19 20 19 19 20  --  GLUCOSE 111* 116* 119* 106* 146* --  BUN 90* 86* 91* 98* 118* --  CREATININE 7.38* 7.29* 7.68* 7.93* 8.21* --  CALCIUM 6.9* 6.7* 7.0* 6.8* 6.6* --  MG -- -- -- -- -- --  PHOS 6.4* 6.3* -- -- 5.7* 5.3*   Liver Function Tests:  Lab 10/07/12 0530 10/06/12 0610 10/03/12 0610 10/02/12 1327 10/02/12 1130  AST -- -- -- 18 --  ALT -- -- -- 10 --  ALKPHOS -- -- -- 45 --  BILITOT -- -- -- 0.3 --  PROT -- -- -- 7.2 --  ALBUMIN 2.9* 2.8* 3.0* 3.5 3.5   CBC:  Lab 10/07/12 0530 10/06/12 0610 10/05/12 1755 10/05/12  0640 10/04/12 1135 10/03/12 0610 10/02/12 1327  WBC 7.2 5.8 6.7 6.1 6.5 -- --  NEUTROABS -- -- -- -- -- 4.8 8.6*  HGB 8.2* 6.7* 7.8* 7.1* 7.4* -- --  HCT 24.5* 20.5* 24.0* 21.9* 22.3* -- --  MCV 93.2 93.2 94.5 93.6 92.9 -- --  PLT 168 163 178 175 178 -- --   CBG:  Lab 10/04/12 0844 10/03/12 2129 10/03/12 1701 10/03/12 1341  GLUCAP 89 117* 149* 152*    Scheduled Meds:    . allopurinol  100 mg Oral 2 times weekly  . amLODipine  10 mg Oral Daily  . ferrous sulfate  325 mg Oral BID WC  . linagliptin  5 mg Oral Daily  . pantoprazole  20 mg Oral BID  . polyethylene glycol  17 g Oral Daily  . sodium chloride  3 mL Intravenous Q12H   Continuous Infusions:    . sodium chloride 20 mL/hr at 10/06/12 1600   Active Problems:  Acute GI bleeding  AKI (acute kidney injury)  Chronic renal disease, stage IV  Hypertension  Acute blood loss anemia  [name]  [facility] Pager [phone]. If 7PM-7AM, please contact night-coverage at [email or web address], password [facility] 10/07/2012, 2:14 PM  LOS: 5 days

## 2012-10-08 LAB — RENAL FUNCTION PANEL
Albumin: 2.9 g/dL — ABNORMAL LOW (ref 3.5–5.2)
CO2: 19 mEq/L (ref 19–32)
Chloride: 105 mEq/L (ref 96–112)
Creatinine, Ser: 7.49 mg/dL — ABNORMAL HIGH (ref 0.50–1.35)
GFR calc Af Amer: 7 mL/min — ABNORMAL LOW (ref >90)
GFR calc non Af Amer: 6 mL/min — ABNORMAL LOW (ref >90)
Potassium: 4.8 mEq/L (ref 3.5–5.1)
Sodium: 138 mEq/L (ref 135–145)

## 2012-10-08 MED ORDER — ALLOPURINOL 100 MG PO TABS: 100.0000 mg | ORAL_TABLET | ORAL | Status: DC

## 2012-10-08 MED ORDER — PANTOPRAZOLE SODIUM 20 MG PO TBEC: 20.0000 mg | DELAYED_RELEASE_TABLET | Freq: Two times a day (BID) | ORAL | Status: DC

## 2012-10-08 NOTE — Discharge Summary (Signed)
Physician Discharge Summary  [name] YNW:[id number] DOB: 1932/05/09 DOA: 10/02/2012  PCP: Jeri Cos, MD  Admit date: 10/02/2012 Discharge date: 10/08/2012  Time spent: 35 minutes  Discharge Diagnoses:  Active Problems:  Acute GI bleeding  AKI (acute kidney injury)  Chronic renal disease, stage IV  Hypertension  Acute blood loss anemia  Hyperkalemia  Discharge Condition: stable  Diet recommendation: renal/heart healthy  Filed Weights   10/05/12 2142 10/06/12 2234 10/07/12 2119  Weight: 96.8 kg (213 lb 6.5 oz) 97.297 kg (214 lb 8 oz) 97.297 kg (214 lb 8 oz)   History of present illness:  [name] is a 79 y.o. male with past medical history of chronic renal disease stage IV currently not on dialysis, past medical history of anemia secondary to chronic renal disease on Epogen, hypertension, also with past medical history of GI bleed in 2007 comes in for feeling weak for the last 2 weeks and confusion 2 days prior to admission. He had a routine CBC at this M.D. office which showed a hemoglobin of 6.3. So he was sent by his primary care Dr. to the emergency room here in the emergency room his hemoglobin is 4. He denies any change in his stools, he is on chronic iron therapy so they are always black. He has been taking aspirin and NSAIDs for osteoarthritis of the hand.  He relates his confusion was intermittent it has not gotten worse. He relates no recent falls, chest pain shortness of breath nausea vomiting fever or diarrhea. He also denies abdominal pain. Here in the ED he was started on 2 units of packed red blood cells and given Protonix.  Hospital Course:  Patient was admitted with acute anemia in the setting of an GI bleed. His hemoglobin on presentation was 4 and he was transfused 3 units of packed red blood cells Gastroenterology was consulted and patient underwent an upper endoscopy on 10/03/2012 which showed for nonbleeding AVMs in the second part of the duodenum,  however the was unlikely to be the source of his GI bleed. Patient subsequently went a colonoscopy in January 2014, which did reveal in the ascending colon two small minimally oozing  AVMs and two larger nonbleeding AVMs, as well as a hepatic flexure AVM which was mildly oozing. The AVM bleed was cauterized with APC as well as clipped. Patient tolerated both procedures well. He was then placed on a clear liquid diet which was advanced to a regular diet and he tolerated that well. He had no further episodes of gi bleed and had 2 bowel movements which were without evidence of active bleed. He did have an episode of a small bleed at the iv site in the morning of 1/31 which was quite significant per patient. His Hb that morning was 6.7 and thus he did receive another unit of pRBC. Subsequent hemoglobins were stable and his most recent Hb was 8.2. Patient has a history of acute on chronic renal failure with baseline creatinine of 6-7 in 2013 and is being followed by Dr. Jamey Ripa as an outpatient. His Cr on presentation was elevated to 8.59. Following blood and fluid administration it improved to some extent and on discharge his Cr was 7.49. On 10/07/2012 he was found to be hyperkalemic with a K of 5.9. He responded well to Kayexalate and his K improved to 5.0 at the next check and 4.8 before discharge. He was counseled on discharge to avoid all NSAIDs including Aspirin and use occasional Tylenol for his wrist pain  related to gout. He is to take Allopurinol twice weekly to help with that as well. He was started on Protonix 20 mg BID.  Procedures: EGD on 10/03/2012 FINDINGs: A small sliding hiatal hernia was identified. No evidence  of any esophagitis during this examination. A mild inflammation  was noted at the pylorus. In the second part of the duodenum 4  very small nonbleeding AVMs were identified and ablated. I do not  feel that these are the sources of his anemia. Retroflexed views  revealed no abnormalities.  The scope was then withdrawn from  the patient and the procedure terminated.  CSY 10/04/2012 COLON FINDINGS: In the ascending colon two small minimally oozing  AVMs and two larger nonbleeding AVMs were identified. Positioning  was difficult, but the lesions were all cauterized with APC. The  second largest AVM continued to ooze after APC and a hemoclip was  deployed, which arrested the bleeding. A hepatic flexure AVM was  identified and cauterized with APC as well as a distal transverse  colon AVM. The hepatic flexure polyp was mildly oozing at the time  of cauteery. Scattered diverticula were identified, but the  highest concentration was in the left side of the colon.  Retroflexed views revealed internal/external hemorrhoids. The  scope was then withdrawn from the patient and the procedure  terminated. Total procedure time was 50 minutes.   Consultations:  GI  Discharge Exam: Filed Vitals:   10/07/12 1818 10/07/12 2119 10/08/12 0530 10/08/12 0947  BP: 177/71 160/70 156/71 171/74  Pulse: 80 83 79 85  Temp: 98 F (36.7 C) 99.1 F (37.3 C) 98.4 F (36.9 C) 98.3 F (36.8 C)  TempSrc: Oral Oral Oral Oral  Resp: 20 20 20 19   Height:  6' (1.829 m)    Weight:  97.297 kg (214 lb 8 oz)    SpO2: 99% 100% 100% 100%    General: He is in no acute distress  Cardiovascular: Regular rate and rhythm, no murmurs  Respiratory: Clear to auscultation bilaterally  Abdomen: Soft, nontender to palpation  Discharge Instructions     Medication List     As of 10/08/2012  1:40 PM    TAKE these medications         allopurinol 100 MG tablet   Commonly known as: ZYLOPRIM   Take 1 tablet (100 mg total) by mouth 2 (two) times a week.      amLODipine 10 MG tablet   Commonly known as: NORVASC   Take 10 mg by mouth Daily.      ferrous sulfate 325 (65 FE) MG tablet   Take 325 mg by mouth 2 (two) times daily with a meal.      pantoprazole 20 MG tablet   Commonly known as: PROTONIX   Take 1  tablet (20 mg total) by mouth 2 (two) times daily.      sitaGLIPtin 50 MG tablet   Commonly known as: JANUVIA   Take 50 mg by mouth daily.      telmisartan 80 MG tablet   Commonly known as: MICARDIS   Take 80 mg by mouth daily.           Follow-up Information    Follow up with Jeri Cos, MD. In 2 weeks.   Contact information:   [address] [place] [address] [phone]           The results of significant diagnostics from this hospitalization (including imaging, microbiology, ancillary  and laboratory) are listed below for reference.     Labs: Basic Metabolic Panel:  Lab 10/08/12 4782 10/07/12 1718 10/07/12 0530 10/06/12 0610 10/05/12 0640 10/03/12 0610 10/02/12 1130  NA 138 138 138 137 140 -- --  K 4.8 5.0 5.9* 5.1 4.7 -- --  CL 105 104 107 106 107 -- --  CO2 19 19 19 20 19  -- --  GLUCOSE 105* 136* 111* 116* 119* -- --  BUN 91* 88* 90* 86* 91* -- --  CREATININE 7.49* 7.32* 7.38* 7.29* 7.68* -- --  CALCIUM 6.9* 7.1* 6.9* 6.7* 7.0* -- --  MG -- -- -- -- -- -- --  PHOS 6.9* -- 6.4* 6.3* -- 5.7* 5.3*   Liver Function Tests:  Lab 10/08/12 0647 10/07/12 0530 10/06/12 0610 10/03/12 0610 10/02/12 1327  AST -- -- -- -- 18  ALT -- -- -- -- 10  ALKPHOS -- -- -- -- 45  BILITOT -- -- -- -- 0.3  PROT -- -- -- -- 7.2  ALBUMIN 2.9* 2.9* 2.8* 3.0* 3.5   CBC:  Lab 10/07/12 0530 10/06/12 0610 10/05/12 1755 10/05/12 0640 10/04/12 1135 10/03/12 0610 10/02/12 1327  WBC 7.2 5.8 6.7 6.1 6.5 -- --  NEUTROABS -- -- -- -- -- 4.8 8.6*  HGB 8.2* 6.7* 7.8* 7.1* 7.4* -- --  HCT 24.5* 20.5* 24.0* 21.9* 22.3* -- --  MCV 93.2 93.2 94.5 93.6 92.9 -- --  PLT 168 163 178 175 178 -- --   CBG:  Lab 10/04/12 0844 10/03/12 2129 10/03/12 1701 10/03/12 1341  GLUCAP 89 117* 149* 152*   Signed:  GHERGHE, COSTIN  [facility] 10/08/2012, 1:40 PM

## 2012-10-08 NOTE — Progress Notes (Signed)
[facility] PROGRESS NOTE  [name] EXB:[id number] DOB: 01-22-1932 DOA: 10/02/2012 PCP: Jeri Cos, MD  Assessment/Plan: Lower gi bleed - s/p colonoscopy 1/29 (full report below).  - no further episodes of bleeding, regular stools.   2. Hyperkalemia - K elevated at 5.9 on 2/1, improved after kayexalate and normal this morning.  3 Acute on chronic renal failure (stage IV) Outpatient Cr ~5-6, here up to 8 and now improving - chronic problem, will see Dr. Jamey Ripa in 2 weeks.   4. Acute anemia  - resolved.  Code Status: Full Family Communication: none Disposition Plan: home today   Consultants:  Gastroenterology  Procedures:  EGD on 10/03/2012 FINDINGs: A small sliding hiatal hernia was identified. No evidence  of any esophagitis during this examination. A mild inflammation  was noted at the pylorus. In the second part of the duodenum 4  very small nonbleeding AVMs were identified and ablated. I do not  feel that these are the sources of his anemia. Retroflexed views  revealed no abnormalities. The scope was then withdrawn from  the patient and the procedure terminated.   CSY 10/04/2012 COLON FINDINGS: In the ascending colon two small minimally oozing  AVMs and two larger nonbleeding AVMs were identified. Positioning  was difficult, but the lesions were all cauterized with APC. The  second largest AVM continued to ooze after APC and a hemoclip was  deployed, which arrested the bleeding. A hepatic flexure AVM was  identified and cauterized with APC as well as a distal transverse  colon AVM. The hepatic flexure polyp was mildly oozing at the time  of cauteery. Scattered diverticula were identified, but the  highest concentration was in the left side of the colon.  Retroflexed views revealed internal/external hemorrhoids. The  scope was then withdrawn from the patient and the procedure  terminated. Total procedure time was 50 minutes.   Antibiotics:  none    HPI/Subjective: - no subjective complaints  Objective: Filed Vitals:   10/07/12 1818 10/07/12 2119 10/08/12 0530 10/08/12 0947  BP: 177/71 160/70 156/71 171/74  Pulse: 80 83 79 85  Temp: 98 F (36.7 C) 99.1 F (37.3 C) 98.4 F (36.9 C) 98.3 F (36.8 C)  TempSrc: Oral Oral Oral Oral  Resp: 20 20 20 19   Height:  6' (1.829 m)    Weight:  97.297 kg (214 lb 8 oz)    SpO2: 99% 100% 100% 100%    Intake/Output Summary (Last 24 hours) at 10/08/12 1337 Last data filed at 10/08/12 0700  Gross per 24 hour  Intake    680 ml  Output   1350 ml  Net   -670 ml   Filed Weights   10/05/12 2142 10/06/12 2234 10/07/12 2119  Weight: 96.8 kg (213 lb 6.5 oz) 97.297 kg (214 lb 8 oz) 97.297 kg (214 lb 8 oz)    Exam:   General:  He is in no acute distress  Cardiovascular: Regular rate and rhythm, no murmurs  Respiratory: Clear to auscultation bilaterally  Abdomen: Soft, nontender to palpation  Data Reviewed: Basic Metabolic Panel:  Lab 10/08/12 1027 10/07/12 1718 10/07/12 0530 10/06/12 0610 10/05/12 0640 10/03/12 0610 10/02/12 1130  NA 138 138 138 137 140 -- --  K 4.8 5.0 5.9* 5.1 4.7 -- --  CL 105 104 107 106 107 -- --  CO2 19 19 19 20 19  -- --  GLUCOSE 105* 136* 111* 116* 119* -- --  BUN 91* 88* 90* 86* 91* -- --  CREATININE 7.49*  7.32* 7.38* 7.29* 7.68* -- --  CALCIUM 6.9* 7.1* 6.9* 6.7* 7.0* -- --  MG -- -- -- -- -- -- --  PHOS 6.9* -- 6.4* 6.3* -- 5.7* 5.3*   Liver Function Tests:  Lab 10/08/12 0647 10/07/12 0530 10/06/12 0610 10/03/12 0610 10/02/12 1327  AST -- -- -- -- 18  ALT -- -- -- -- 10  ALKPHOS -- -- -- -- 45  BILITOT -- -- -- -- 0.3  PROT -- -- -- -- 7.2  ALBUMIN 2.9* 2.9* 2.8* 3.0* 3.5   CBC:  Lab 10/07/12 0530 10/06/12 0610 10/05/12 1755 10/05/12 0640 10/04/12 1135 10/03/12 0610 10/02/12 1327  WBC 7.2 5.8 6.7 6.1 6.5 -- --  NEUTROABS -- -- -- -- -- 4.8 8.6*  HGB 8.2* 6.7* 7.8* 7.1* 7.4* -- --  HCT 24.5* 20.5* 24.0* 21.9* 22.3* -- --  MCV 93.2 93.2  94.5 93.6 92.9 -- --  PLT 168 163 178 175 178 -- --   CBG:  Lab 10/04/12 0844 10/03/12 2129 10/03/12 1701 10/03/12 1341  GLUCAP 89 117* 149* 152*    Scheduled Meds:    . allopurinol  100 mg Oral 2 times weekly  . amLODipine  10 mg Oral Daily  . ferrous sulfate  325 mg Oral BID WC  . linagliptin  5 mg Oral Daily  . pantoprazole  20 mg Oral BID  . polyethylene glycol  17 g Oral Daily  . sodium chloride  3 mL Intravenous Q12H   Continuous Infusions:    . sodium chloride 20 mL/hr at 10/06/12 1600   Active Problems:  Acute GI bleeding  AKI (acute kidney injury)  Chronic renal disease, stage IV  Hypertension  Acute blood loss anemia  Hyperkalemia  [name]  [facility] Pager [phone]. If 7PM-7AM, please contact night-coverage at [email or web address], password [facility] 10/08/2012, 1:37 PM  LOS: 6 days

## 2012-11-29 ENCOUNTER — Inpatient Hospital Stay (HOSPITAL_COMMUNITY)
Admission: EM | Admit: 2012-11-29 | Discharge: 2012-12-01 | DRG: 378 | Disposition: A | Discharge status: Home / Self Care | Payer: Medicare Other | Attending: Internal Medicine | Admitting: Internal Medicine

## 2012-11-29 ENCOUNTER — Encounter (HOSPITAL_COMMUNITY): Payer: Self-pay | Admitting: *Deleted

## 2012-11-29 DIAGNOSIS — N19 Unspecified kidney failure: Secondary | ICD-10-CM

## 2012-11-29 DIAGNOSIS — E875 Hyperkalemia: Secondary | ICD-10-CM | POA: Diagnosis present

## 2012-11-29 DIAGNOSIS — D62 Acute posthemorrhagic anemia: Secondary | ICD-10-CM | POA: Diagnosis present

## 2012-11-29 DIAGNOSIS — D631 Anemia in chronic kidney disease: Secondary | ICD-10-CM | POA: Diagnosis present

## 2012-11-29 DIAGNOSIS — E872 Acidosis: Secondary | ICD-10-CM

## 2012-11-29 DIAGNOSIS — D696 Thrombocytopenia, unspecified: Secondary | ICD-10-CM | POA: Diagnosis present

## 2012-11-29 DIAGNOSIS — N2581 Secondary hyperparathyroidism of renal origin: Secondary | ICD-10-CM | POA: Diagnosis present

## 2012-11-29 DIAGNOSIS — N189 Chronic kidney disease, unspecified: Secondary | ICD-10-CM | POA: Diagnosis present

## 2012-11-29 DIAGNOSIS — K922 Gastrointestinal hemorrhage, unspecified: Secondary | ICD-10-CM

## 2012-11-29 DIAGNOSIS — E119 Type 2 diabetes mellitus without complications: Secondary | ICD-10-CM

## 2012-11-29 DIAGNOSIS — I10 Essential (primary) hypertension: Secondary | ICD-10-CM

## 2012-11-29 DIAGNOSIS — N185 Chronic kidney disease, stage 5: Secondary | ICD-10-CM | POA: Diagnosis present

## 2012-11-29 DIAGNOSIS — I120 Hypertensive chronic kidney disease with stage 5 chronic kidney disease or end stage renal disease: Secondary | ICD-10-CM | POA: Diagnosis present

## 2012-11-29 DIAGNOSIS — E11649 Type 2 diabetes mellitus with hypoglycemia without coma: Secondary | ICD-10-CM

## 2012-11-29 DIAGNOSIS — N039 Chronic nephritic syndrome with unspecified morphologic changes: Secondary | ICD-10-CM | POA: Diagnosis present

## 2012-11-29 DIAGNOSIS — Q2733 Arteriovenous malformation of digestive system vessel: Secondary | ICD-10-CM

## 2012-11-29 DIAGNOSIS — D649 Anemia, unspecified: Secondary | ICD-10-CM

## 2012-11-29 DIAGNOSIS — E1169 Type 2 diabetes mellitus with other specified complication: Secondary | ICD-10-CM | POA: Diagnosis present

## 2012-11-29 DIAGNOSIS — K5521 Angiodysplasia of colon with hemorrhage: Principal | ICD-10-CM

## 2012-11-29 HISTORY — DX: Gastrointestinal hemorrhage, unspecified: K92.2

## 2012-11-29 HISTORY — DX: Personal history of other medical treatment: Z92.89

## 2012-11-29 LAB — BASIC METABOLIC PANEL
BUN: 127 mg/dL — ABNORMAL HIGH (ref 6–23)
Chloride: 106 mEq/L (ref 96–112)
GFR calc Af Amer: 5 mL/min — ABNORMAL LOW (ref >90)
GFR calc non Af Amer: 5 mL/min — ABNORMAL LOW (ref >90)
Potassium: 5.5 mEq/L — ABNORMAL HIGH (ref 3.5–5.1)
Sodium: 137 mEq/L (ref 135–145)

## 2012-11-29 LAB — CBC WITH DIFFERENTIAL/PLATELET
Basophils Absolute: 0 10*3/uL (ref 0.0–0.1)
Basophils Relative: 0 % (ref 0–1)
Eosinophils Absolute: 0.2 10*3/uL (ref 0.0–0.7)
HCT: 17.8 % — ABNORMAL LOW (ref 39.0–52.0)
Hemoglobin: 6.1 g/dL — CL (ref 13.0–17.0)
Lymphocytes Relative: 19 % (ref 12–46)
MCHC: 34.3 g/dL (ref 30.0–36.0)
Monocytes Relative: 6 % (ref 3–12)
Neutrophils Relative %: 70 % (ref 43–77)
WBC: 4.5 10*3/uL (ref 4.0–10.5)

## 2012-11-29 LAB — MRSA PCR SCREENING: MRSA by PCR: NEGATIVE

## 2012-11-29 LAB — OCCULT BLOOD, POC DEVICE: Fecal Occult Bld: POSITIVE — AB

## 2012-11-29 LAB — PREPARE RBC (CROSSMATCH)

## 2012-11-29 LAB — GLUCOSE, CAPILLARY: Glucose-Capillary: 95 mg/dL (ref 70–99)

## 2012-11-29 MED ORDER — SODIUM CHLORIDE 0.9 % IV SOLN
INTRAVENOUS | Status: DC
  Administered 2012-11-30: 04:00:00 via INTRAVENOUS

## 2012-11-29 MED ORDER — ONDANSETRON HCL 4 MG PO TABS: 4.0000 mg | ORAL_TABLET | Freq: Four times a day (QID) | ORAL | Status: DC | PRN

## 2012-11-29 MED ORDER — SODIUM CHLORIDE 0.9 % IJ SOLN
3.0000 mL | Freq: Two times a day (BID) | INTRAMUSCULAR | Status: DC
  Administered 2012-11-30 (×2): 3 mL via INTRAVENOUS

## 2012-11-29 MED ORDER — SODIUM CHLORIDE 0.9 % IJ SOLN: 3.0000 mL | INTRAMUSCULAR | Status: DC | PRN

## 2012-11-29 MED ORDER — ACETAMINOPHEN 325 MG PO TABS: 650.0000 mg | ORAL_TABLET | Freq: Four times a day (QID) | ORAL | Status: DC | PRN

## 2012-11-29 MED ORDER — SODIUM POLYSTYRENE SULFONATE 15 GM/60ML PO SUSP
15.0000 g | Freq: Once | ORAL | Status: AC
  Administered 2012-11-29: 15 g via ORAL
  Filled 2012-11-29: qty 60

## 2012-11-29 MED ORDER — FUROSEMIDE 10 MG/ML IJ SOLN
40.0000 mg | Freq: Once | INTRAMUSCULAR | Status: AC
  Administered 2012-11-29: 40 mg via INTRAVENOUS
  Filled 2012-11-29: qty 4

## 2012-11-29 MED ORDER — SODIUM CHLORIDE 0.9 % IV SOLN: 250.0000 mL | INTRAVENOUS | Status: DC | PRN

## 2012-11-29 MED ORDER — MORPHINE SULFATE 2 MG/ML IJ SOLN: 1.0000 mg | INTRAMUSCULAR | Status: DC | PRN

## 2012-11-29 MED ORDER — ONDANSETRON HCL 4 MG/2ML IJ SOLN: 4.0000 mg | Freq: Four times a day (QID) | INTRAMUSCULAR | Status: DC | PRN

## 2012-11-29 MED ORDER — SODIUM CHLORIDE 0.9 % IJ SOLN: 3.0000 mL | Freq: Two times a day (BID) | INTRAMUSCULAR | Status: DC

## 2012-11-29 MED ORDER — PEG 3350-KCL-NA BICARB-NACL 420 G PO SOLR
4000.0000 mL | Freq: Once | ORAL | Status: AC
  Administered 2012-11-29: 4000 mL via ORAL
  Filled 2012-11-29: qty 4000

## 2012-11-29 MED ORDER — ACETAMINOPHEN 650 MG RE SUPP: 650.0000 mg | Freq: Four times a day (QID) | RECTAL | Status: DC | PRN

## 2012-11-29 NOTE — ED Provider Notes (Signed)
History     CSN: [id number]  Arrival date & time 11/29/12  1543   First MD Initiated Contact with Patient 11/29/12 1614      Chief Complaint  Patient presents with  . Anemia    hgb 6    Patient is a 79 y.o. male presenting with anemia. The history is provided by the patient.  Anemia This is a recurrent problem. Episode onset: unknown time ago. The problem occurs constantly. The problem has been gradually worsening. Pertinent negatives include no chest pain and no shortness of breath. Exacerbated by: activity. The symptoms are relieved by rest. He has tried nothing for the symptoms.  pt reports he was sent by his PCP for anemia It is reported his HGB is 6 He reports h/o previous transfusion He also reports h/o GI bleed but reports he was seen by his GI physician recently and was told his bleeding has stopped He reports he takes iron and this causes his stool to be black  Past Medical History  Diagnosis Date  . Hypertension   . Diabetes mellitus without complication   . Arthritis   . Renal disorder     chronic renal disease  . History of blood transfusion   . GI bleed   . History of blood transfusion     Past Surgical History  Procedure Laterality Date  . Esophagogastroduodenoscopy  10/03/2012    Procedure: ESOPHAGOGASTRODUODENOSCOPY (EGD);  Surgeon: Theda Belfast, MD;  Location: [facility] ENDOSCOPY;  Service: Endoscopy;  Laterality: N/A;  . Colonoscopy  10/04/2012    Procedure: COLONOSCOPY;  Surgeon: Theda Belfast, MD;  Location: [facility] ENDOSCOPY;  Service: Endoscopy;  Laterality: N/A;    Family History  Problem Relation Age of Onset  . Sudden death Mother   . Sudden death Father     History  Substance Use Topics  . Smoking status: Never Smoker   . Smokeless tobacco: Not on file  . Alcohol Use: No      Review of Systems  Respiratory: Negative for shortness of breath.   Cardiovascular: Negative for chest pain.  All other systems reviewed and are  negative.    Allergies  Review of patient's allergies indicates no known allergies.  Home Medications   Current Outpatient Rx  Name  Route  Sig  Dispense  Refill  . allopurinol (ZYLOPRIM) 100 MG tablet   Oral   Take 1 tablet (100 mg total) by mouth 2 (two) times a week.   10 tablet   0   . amLODipine (NORVASC) 10 MG tablet   Oral   Take 10 mg by mouth Daily.          . ferrous sulfate 325 (65 FE) MG tablet   Oral   Take 325 mg by mouth 2 (two) times daily with a meal.         . pantoprazole (PROTONIX) 20 MG tablet   Oral   Take 1 tablet (20 mg total) by mouth 2 (two) times daily.   60 tablet   2   . sitaGLIPtin (JANUVIA) 50 MG tablet   Oral   Take 50 mg by mouth daily.         Marland Kitchen telmisartan (MICARDIS) 80 MG tablet   Oral   Take 80 mg by mouth daily.           BP 155/71  Pulse 80  Temp(Src) 97.9 F (36.6 C) (Oral)  Resp 18  Ht 6' (1.829 m)  Wt 206 lb (93.441  kg)  BMI 27.93 kg/m2  SpO2 100%  Physical Exam CONSTITUTIONAL: Well developed/well nourished HEAD: Normocephalic/atraumatic EYES: EOMI/PERRL ENMT: Mucous membranes moist NECK: supple no meningeal signs CV: S1/S2 noted, no murmurs/rubs/gallops noted LUNGS: Lungs are clear to auscultation bilaterally, no apparent distress ABDOMEN: soft, nontender, no rebound or guarding Rectal - stool color black, hemoccult positive NEURO: Pt is awake/alert, moves all extremitiesx4 EXTREMITIES: pulses normal, full ROM SKIN: warm, color normal PSYCH: no abnormalities of mood noted  ED Course  Procedures (including critical care time)  Labs Reviewed  BASIC METABOLIC PANEL  CBC WITH DIFFERENTIAL  TYPE AND SCREEN  4:59 PM I spoke to dr hung his GI doctor He is aware of patient Will see in hospital.  Pt is currently stable, but suspect GI bleed 5:45 PM Worsened renal failure noted with mild hyperK No acute EKG changes Will call hospitalist for admission 5:58 PM D/w dr Ardyth Harps, will admit to  stepdown PRBC has been ordered, pt has no objection to receiving blood products  MDM  Nursing notes including past medical history and social history reviewed and considered in documentation Labs/vital reviewed and considered Previous records reviewed and considered - h/o GI bleed requiring transfusion        Date: 11/29/2012  Rate: 75  Rhythm: normal sinus rhythm  QRS Axis: normal  Intervals: normal  ST/T Wave abnormalities: nonspecific ST changes  Conduction Disutrbances:none  Narrative Interpretation:   Old EKG Reviewed: none available at time of interpretation    Joya Gaskins, MD 11/29/12 1759

## 2012-11-29 NOTE — H&P (Signed)
[facility]          History and Physical    PCP:   Geraldo Pitter, MD   Chief Complaint:  Low hb   HPI: Pleasant 79 y/o man with h/o recurrent GI bleed 2/2 duodenal and colonic AVMs. Was just here in Feb for this reason. He went to see his PCP today and was told to come to the ED because his Hb was 6.1. He feels generally weak but has no other symptoms. He has noted that his stools have been darker than usual. His GI MD, Dr. Elnoria Howard has been notified and will see the patient in consultation. We have been asked to admit him for further evaluation and management.  Allergies:  No Known Allergies    Past Medical History  Diagnosis Date  . Hypertension   . Diabetes mellitus without complication   . Arthritis   . Renal disorder     chronic renal disease  . History of blood transfusion   . GI bleed   . History of blood transfusion     Past Surgical History  Procedure Laterality Date  . Esophagogastroduodenoscopy  10/03/2012    Procedure: ESOPHAGOGASTRODUODENOSCOPY (EGD);  Surgeon: Theda Belfast, MD;  Location: [facility] ENDOSCOPY;  Service: Endoscopy;  Laterality: N/A;  . Colonoscopy  10/04/2012    Procedure: COLONOSCOPY;  Surgeon: Theda Belfast, MD;  Location: [facility] ENDOSCOPY;  Service: Endoscopy;  Laterality: N/A;    Prior to Admission medications   Medication Sig Start Date End Date Taking? Authorizing Provider  allopurinol (ZYLOPRIM) 100 MG tablet Take 100 mg by mouth 2 (two) times a week. 10/08/12  Yes [name], MD  amLODipine (NORVASC) 10 MG tablet Take 10 mg by mouth Daily.  08/15/12  Yes Historical Provider, MD  diphenhydramine-acetaminophen (TYLENOL PM) 25-500 MG TABS Take 1 tablet by mouth at bedtime as needed (for pain).   Yes Historical Provider, MD  ferrous sulfate 325 (65 FE) MG tablet Take 325 mg by mouth 2 (two) times daily with a meal.   Yes Historical Provider, MD  pantoprazole (PROTONIX) 20 MG tablet Take 20 mg by mouth 2 (two) times daily. 10/08/12  Yes  Costin Gherghe, MD  sitaGLIPtin (JANUVIA) 50 MG tablet Take 50 mg by mouth daily.   Yes Historical Provider, MD  sodium chloride (OCEAN) 0.65 % nasal spray Place 1 spray into the nose as needed for congestion.   Yes Historical Provider, MD  telmisartan (MICARDIS) 80 MG tablet Take 80 mg by mouth daily.   Yes Historical Provider, MD    Social History:  reports that he has never smoked. He does not have any smokeless tobacco history on file. He reports that he does not drink alcohol or use illicit drugs.  Family History  Problem Relation Age of Onset  . Sudden death Mother   . Sudden death Father     Review of Systems:  Constitutional: Denies fever, chills, diaphoresis, appetite change and fatigue.  HEENT: Denies photophobia, eye pain, redness, hearing loss, ear pain, congestion, sore throat, rhinorrhea, sneezing, mouth sores, trouble swallowing, neck pain, neck stiffness and tinnitus.   Respiratory: Denies SOB, DOE, cough, chest tightness,  and wheezing.   Cardiovascular: Denies chest pain, palpitations and leg swelling.  Gastrointestinal: Denies nausea, vomiting, abdominal pain, diarrhea, constipation and abdominal distention.  Genitourinary: Denies dysuria, urgency, frequency, hematuria, flank pain and difficulty urinating.  Musculoskeletal: Denies myalgias, back pain, joint swelling, arthralgias and gait problem.  Skin: Denies pallor, rash and wound.  Neurological:  Denies dizziness, seizures, syncope, weakness, light-headedness, numbness and headaches.  Hematological: Denies adenopathy. Easy bruising, personal or family bleeding history  Psychiatric/Behavioral: Denies suicidal ideation, mood changes, confusion, nervousness, sleep disturbance and agitation   Physical Exam: Blood pressure 142/64, pulse 73, temperature 97.9 F (36.6 C), temperature source Oral, resp. rate 16, height 6' (1.829 m), weight 93.441 kg (206 lb), SpO2 100.00%. Gen: AA Ox3, NAD HEENT: [place]/AT/PERRL/EOMI Neck:  supple, no JVD, no LAD, no bruits, no goiter CV: RRR, no M/R/G Lungs: CTA B Abd: S/NT/ND/+BS/no masses or organomegaly noted. Ext: 1+ edema bilaterally, +pedal pulses. Neuro: grossly intact and non-focal.  Labs on Admission:  Results for orders placed during the hospital encounter of 11/29/12 (from the past 48 hour(s))  BASIC METABOLIC PANEL     Status: Abnormal   Collection Time    11/29/12  4:35 PM      Result Value Range   Sodium 137  135 - 145 mEq/L   Potassium 5.5 (*) 3.5 - 5.1 mEq/L   Chloride 106  96 - 112 mEq/L   CO2 13 (*) 19 - 32 mEq/L   Glucose, Bld 156 (*) 70 - 99 mg/dL   BUN 161 (*) 6 - 23 mg/dL   Creatinine, Ser 0.96 (*) 0.50 - 1.35 mg/dL   Calcium 6.6 (*) 8.4 - 10.5 mg/dL   GFR calc non Af Amer 5 (*) >90 mL/min   GFR calc Af Amer 5 (*) >90 mL/min   Comment:            The eGFR has been calculated     using the CKD EPI equation.     This calculation has not been     validated in all clinical     situations.     eGFR's persistently     <90 mL/min signify     possible Chronic Kidney Disease.  CBC WITH DIFFERENTIAL     Status: Abnormal   Collection Time    11/29/12  4:35 PM      Result Value Range   WBC 4.5  4.0 - 10.5 K/uL   RBC 2.00 (*) 4.22 - 5.81 MIL/uL   Hemoglobin 6.1 (*) 13.0 - 17.0 g/dL   Comment: DELTA CHECK NOTED     REPEATED TO VERIFY     CRITICAL RESULT CALLED TO, READ BACK BY AND VERIFIED WITH:     J NEGRON RN 1704 11/29/12 A BROWNING   HCT 17.8 (*) 39.0 - 52.0 %   MCV 89.0  78.0 - 100.0 fL   MCH 30.5  26.0 - 34.0 pg   MCHC 34.3  30.0 - 36.0 g/dL   RDW 04.5 (*) 40.9 - 81.1 %   Platelets 107 (*) 150 - 400 K/uL   Comment: PLATELET COUNT CONFIRMED BY SMEAR     REPEATED TO VERIFY     SPECIMEN CHECKED FOR CLOTS   Neutrophils Relative 70  43 - 77 %   Lymphocytes Relative 19  12 - 46 %   Monocytes Relative 6  3 - 12 %   Eosinophils Relative 5  0 - 5 %   Basophils Relative 0  0 - 1 %   Neutro Abs 3.1  1.7 - 7.7 K/uL   Lymphs Abs 0.9  0.7 - 4.0  K/uL   Monocytes Absolute 0.3  0.1 - 1.0 K/uL   Eosinophils Absolute 0.2  0.0 - 0.7 K/uL   Basophils Absolute 0.0  0.0 - 0.1 K/uL  OCCULT BLOOD, POC DEVICE  Status: Abnormal   Collection Time    11/29/12  4:39 PM      Result Value Range   Fecal Occult Bld POSITIVE (*) NEGATIVE  TYPE AND SCREEN     Status: None   Collection Time    11/29/12  4:40 PM      Result Value Range   ABO/RH(D) AB POS     Antibody Screen NEG     Sample Expiration 12/02/2012     Unit Number [id number]     Blood Component Type RED CELLS,LR     Unit division 00     Status of Unit ISSUED     Transfusion Status OK TO TRANSFUSE     Crossmatch Result Compatible    PREPARE RBC (CROSSMATCH)     Status: None   Collection Time    11/29/12  6:00 PM      Result Value Range   Order Confirmation ORDER PROCESSED BY BLOOD BANK      Radiological Exams on Admission: No results found.  Assessment/Plan Principal Problem:   Acute GI bleeding Active Problems:   CKD (chronic kidney disease) stage 5, GFR less than 15 ml/min   Acute blood loss anemia   Hyperkalemia   AVM (arteriovenous malformation) of colon with hemorrhage    GI Bleed -Likely from known duodenal and colonic AVMs. -Admit to SDU, transfuse 2 units of PRBCs. -Clear liquids in case further GI studies needed. -Had EGD/colonoscopy in Jan 2014. -Dr. Elnoria Howard to see patient in consultation.  Hyperkalemia -From advanced CKD. -Will give one dose kayexalate.  CKD Stage V -He is nearing ESRD. -Already has vascular access in place. -Used to see Dr. Bascom Levels but has been referred to Dr. Lowell Guitar as Dr. Bascom Levels will soon be retiring. -No indications for acute HD at present (no volume overload, no rub, no uremic symptoms and only mild hyperkalemia).  ABLA -Transfuse 2 units of PRBCs for Hb of 6.1.    Time Spent on Admission: 80 minutes.  Chaya Jan [facility] Pager: [phone] 11/29/2012, 6:59 PM

## 2012-11-29 NOTE — ED Notes (Signed)
PT sent here from [facility] for a hgb of 6 (drawn today).  Pt normally has an epogen shot every 2 weeks, but has not had one since Jan b/c his Dr, Dr Audrie Lia, retired and did not write him any orders.  Pt c/o weakness, but denies sob.  VS stable.

## 2012-11-30 ENCOUNTER — Encounter (HOSPITAL_COMMUNITY): Payer: Self-pay

## 2012-11-30 ENCOUNTER — Encounter (HOSPITAL_COMMUNITY)
Admission: EM | Disposition: A | Discharge status: Home / Self Care | Payer: Self-pay | Source: Home / Self Care | Attending: Internal Medicine

## 2012-11-30 DIAGNOSIS — N185 Chronic kidney disease, stage 5: Secondary | ICD-10-CM

## 2012-11-30 DIAGNOSIS — D631 Anemia in chronic kidney disease: Secondary | ICD-10-CM | POA: Diagnosis present

## 2012-11-30 DIAGNOSIS — E119 Type 2 diabetes mellitus without complications: Secondary | ICD-10-CM

## 2012-11-30 DIAGNOSIS — N19 Unspecified kidney failure: Secondary | ICD-10-CM

## 2012-11-30 DIAGNOSIS — E11649 Type 2 diabetes mellitus with hypoglycemia without coma: Secondary | ICD-10-CM | POA: Diagnosis present

## 2012-11-30 DIAGNOSIS — N189 Chronic kidney disease, unspecified: Secondary | ICD-10-CM

## 2012-11-30 DIAGNOSIS — E872 Acidosis: Secondary | ICD-10-CM | POA: Diagnosis present

## 2012-11-30 HISTORY — PX: COLONOSCOPY WITH ESOPHAGOGASTRODUODENOSCOPY (EGD): SHX5779

## 2012-11-30 LAB — GLUCOSE, CAPILLARY
Glucose-Capillary: 81 mg/dL (ref 70–99)
Glucose-Capillary: 113 mg/dL — ABNORMAL HIGH (ref 70–99)
Glucose-Capillary: 101 mg/dL — ABNORMAL HIGH (ref 70–99)
Glucose-Capillary: 110 mg/dL — ABNORMAL HIGH (ref 70–99)

## 2012-11-30 LAB — CBC
HCT: 24.8 % — ABNORMAL LOW (ref 39.0–52.0)
MCH: 29.8 pg (ref 26.0–34.0)
MCHC: 33.7 g/dL (ref 30.0–36.0)
Platelets: 93 10*3/uL — ABNORMAL LOW (ref 150–400)
Platelets: 98 10*3/uL — ABNORMAL LOW (ref 150–400)
RBC: 2.82 MIL/uL — ABNORMAL LOW (ref 4.22–5.81)
RDW: 17.2 % — ABNORMAL HIGH (ref 11.5–15.5)
RDW: 16.8 % — ABNORMAL HIGH (ref 11.5–15.5)
WBC: 5 10*3/uL (ref 4.0–10.5)

## 2012-11-30 LAB — BASIC METABOLIC PANEL
Chloride: 108 mEq/L (ref 96–112)
Creatinine, Ser: 9.46 mg/dL — ABNORMAL HIGH (ref 0.50–1.35)
GFR calc Af Amer: 5 mL/min — ABNORMAL LOW (ref >90)

## 2012-11-30 SURGERY — COLONOSCOPY WITH ESOPHAGOGASTRODUODENOSCOPY (EGD)
Anesthesia: Moderate Sedation | Laterality: Left

## 2012-11-30 MED ORDER — DARBEPOETIN ALFA-POLYSORBATE 100 MCG/0.5ML IJ SOLN
100.0000 ug | INTRAMUSCULAR | Status: DC
  Administered 2012-11-30: 100 ug via SUBCUTANEOUS
  Filled 2012-11-30: qty 0.5

## 2012-11-30 MED ORDER — FENTANYL CITRATE 0.05 MG/ML IJ SOLN
INTRAMUSCULAR | Status: AC
  Filled 2012-11-30: qty 4

## 2012-11-30 MED ORDER — MIDAZOLAM HCL 5 MG/ML IJ SOLN
INTRAMUSCULAR | Status: AC
  Filled 2012-11-30: qty 2

## 2012-11-30 MED ORDER — INSULIN ASPART 100 UNIT/ML ~~LOC~~ SOLN: 0.0000 [IU] | Freq: Every day | SUBCUTANEOUS | Status: DC

## 2012-11-30 MED ORDER — MIDAZOLAM HCL 10 MG/2ML IJ SOLN
INTRAMUSCULAR | Status: DC | PRN
  Administered 2012-11-30: 2 mg via INTRAVENOUS
  Administered 2012-11-30: 1 mg via INTRAVENOUS
  Administered 2012-11-30: 2 mg via INTRAVENOUS
  Administered 2012-11-30: 1 mg via INTRAVENOUS

## 2012-11-30 MED ORDER — FENTANYL CITRATE 0.05 MG/ML IJ SOLN
INTRAMUSCULAR | Status: DC | PRN
  Administered 2012-11-30 (×3): 25 ug via INTRAVENOUS

## 2012-11-30 MED ORDER — AMLODIPINE BESYLATE 10 MG PO TABS
10.0000 mg | ORAL_TABLET | Freq: Every day | ORAL | Status: DC
  Administered 2012-11-30 – 2012-12-01 (×2): 10 mg via ORAL
  Filled 2012-11-30 (×2): qty 1

## 2012-11-30 MED ORDER — DEXTROSE 50 % IV SOLN
12.5000 g | Freq: Once | INTRAVENOUS | Status: AC
  Administered 2012-11-30: 12.5 g via INTRAVENOUS
  Filled 2012-11-30: qty 50

## 2012-11-30 MED ORDER — SODIUM CHLORIDE 0.9 % IV SOLN
0.3000 ug/kg | Freq: Once | INTRAVENOUS | Status: AC
  Administered 2012-11-30: 27.6 ug via INTRAVENOUS
  Filled 2012-11-30: qty 6.9

## 2012-11-30 MED ORDER — INSULIN ASPART 100 UNIT/ML ~~LOC~~ SOLN: 0.0000 [IU] | Freq: Three times a day (TID) | SUBCUTANEOUS | Status: DC

## 2012-11-30 MED ORDER — DEXTROSE 5 % IV SOLN
INTRAVENOUS | Status: DC
  Administered 2012-11-30: 10:00:00 via INTRAVENOUS

## 2012-11-30 NOTE — Progress Notes (Signed)
[name] is a 79 y.o. male patient who transferred  from 44 with a GI bleed. awake, alert  & orientated  X 3, Full Code, VSS - Blood pressure 167/78, pulse 89, temperature 97.7 F (36.5 C), temperature source Oral, resp. rate 16, height 6' (1.829 m), weight 92.4 kg (203 lb 11.3 oz), SpO2 100.00%.,, no c/o shortness of breath, no c/o chest pain, no distress noted. Tele # [id number] placed and pt is currently running:normal sinus rhythm.   IV site WDL: hand left, condition patent and no redness with a transparent dsg that's clean dry and intact.  Allergies:  No Known Allergies   Past Medical History  Diagnosis Date  . Hypertension   . Diabetes mellitus without complication   . Arthritis   . Renal disorder     chronic renal disease  . History of blood transfusion   . GI bleed   . History of blood transfusion     Pt orientation to unit, room and routine. SR up x 2, fall risk assessment complete with Patient and family verbalizing understanding of risks associated with falls. Pt verbalizes an understanding of how to use the call bell and to call for help before getting out of bed.  Skin, clean-dry- intact without evidence of bruising, or skin tears.   No evidence of skin break down noted on exam.     Will cont to monitor and assist as needed.  Cindra Eves, RN 11/30/2012 7:14 PM

## 2012-11-30 NOTE — Consult Note (Signed)
[facility] Renal Consultation Note  Requesting MD:  Rizwan Indication for Consultation: advanced CKD previously followed by Bascom Levels, now with crt of 9  HPI: [name] is a 79 y.o. male with PMhx of HTN, DM, gout and recent history of GIB as well as advanced CKD followed by Bascom Levels with AVF in place who is admitted with acute on chronic anemia with recurrent GIB.  We are asked to see him due to the fact that he has advanced CKD and Dr. Bascom Levels has recently retired and he has not yet established with [facility].  His acces would be ready to use if needed but he denies any uremic symptoms and states that he has been doing fairly well, he denies uremic symptoms.  One thing that likely contributed to his acute on chronic anemia is that he has not been able to get his aranesp shots at short stay due to Dr. Janey Greaser retirement.    Creatinine, Ser  Date/Time Value Range Status  11/30/2012  4:50 AM 9.46* 0.50 - 1.35 mg/dL Final  1/61/0960  4:54 PM 9.65* 0.50 - 1.35 mg/dL Final  0/05/8118  1:47 AM 7.49* 0.50 - 1.35 mg/dL Final  04/07/9561  1:30 PM 7.32* 0.50 - 1.35 mg/dL Final  04/12/5783  6:96 AM 7.38* 0.50 - 1.35 mg/dL Final  2/95/2841  3:24 AM 7.29* 0.50 - 1.35 mg/dL Final  12/06/270  5:36 AM 7.68* 0.50 - 1.35 mg/dL Final  6/44/0347  4:25 AM 7.93* 0.50 - 1.35 mg/dL Final  9/56/3875  6:43 AM 8.21* 0.50 - 1.35 mg/dL Final  12/03/5186  4:16 PM 8.50* 0.50 - 1.35 mg/dL Final  02/10/3015 01:09 AM 8.59* 0.50 - 1.35 mg/dL Final  32/35/5732 20:25 AM 7.28* 0.50 - 1.35 mg/dL Final  42/03/622 76:28 AM 7.52* 0.50 - 1.35 mg/dL Final  31/01/1760 60:73 AM 6.77* 0.50 - 1.35 mg/dL Final  71/0/6269 48:54 AM 6.95* 0.50 - 1.35 mg/dL Final  02/05/7034 00:93 AM 6.76* 0.50 - 1.35 mg/dL Final  04/23/2992 71:69 AM 6.84* 0.50 - 1.35 mg/dL Final  6/78/9381 01:75 AM 6.73* 0.50 - 1.35 mg/dL Final  09/07/5850 77:82 AM 6.92* 0.50 - 1.35 mg/dL Final  12/28/5359  4:43 PM 6.31* 0.50 - 1.35 mg/dL Final  09/10/4006 67:61 AM 6.62*  0.50 - 1.35 mg/dL Final  05/12/931 67:12 AM 5.88* 0.50 - 1.35 mg/dL Final  4/58/0998 33:82 AM 5.85* 0.50 - 1.35 mg/dL Final  50/53/9767 34:19 PM 5.34* 0.50 - 1.35 mg/dL Final  37/05/239 97:35 AM 5.08* 0.50 - 1.35 mg/dL Final  32/99/2426 83:41 AM 6.04* 0.50 - 1.35 mg/dL Final  05/12/2228 79:89 AM 5.74* 0.50 - 1.35 mg/dL Final  10/08/1939 74:08 PM 5.33* 0.50 - 1.35 mg/dL Final  1/44/8185 63:14 PM 5.59* 0.50 - 1.35 mg/dL Final  9/70/2637 85:88 PM 4.95* 0.50 - 1.35 mg/dL Final     **Please note change in reference range.**  02/05/2011 12:29 PM 5.54* 0.4 - 1.5 mg/dL Final  01/06/7740 28:78 PM 5.30* 0.4 - 1.5 mg/dL Final  6/76/7209 47:09 PM 5.52* 0.4 - 1.5 mg/dL Final  03/03/3661 94:76 PM 5.58* 0.4 - 1.5 mg/dL Final  5/46/5035 46:56 PM 5.38* 0.4 - 1.5 mg/dL Final  81/27/5170 01:74 PM 5.53* 0.4 - 1.5 mg/dL Final  94/12/9673 91:63 PM 5.58* 0.4 - 1.5 mg/dL Final  84/02/6598 35:70 PM 6.17* 0.4 - 1.5 mg/dL Final  09/12/7937 03:00 PM 5.45* 0.4 - 1.5 mg/dL Final  05/29/3006 62:26 PM 5.19* 0.4 - 1.5 mg/dL Final  3/33/5456 25:63 PM 4.67* 0.4 - 1.5 mg/dL  Final  01/13/2010  2:36 PM 5.09* 0.4 - 1.5 mg/dL Final  10/20/863 78:46 PM 4.96* 0.4 - 1.5 mg/dL Final  05/13/2951 84:13 PM 4.44* 0.4 - 1.5 mg/dL Final  10/10/4008  2:72 PM 4.66* 0.4 - 1.5 mg/dL Final  01/07/6643 03:47 PM 4.78* 0.4 - 1.5 mg/dL Final  42/01/9562  8:75 PM 5.12* 0.4 - 1.5 mg/dL Final  64/11/3293 18:84 PM 4.39* 0.4 - 1.5 mg/dL Final  1/66/0630 16:01 PM 4.71* 0.4 - 1.5 mg/dL Final  0/93/2355 73:22 PM 4.34* 0.4 - 1.5 mg/dL Final  0/25/4270 62:37 PM 3.95* 0.4 - 1.5 mg/dL Final  03/03/3150 76:16 PM 4.96* 0.4 - 1.5 mg/dL Final     PMHx:   Past Medical History  Diagnosis Date  . Hypertension   . Diabetes mellitus without complication   . Arthritis   . Renal disorder     chronic renal disease  . History of blood transfusion   . GI bleed   . History of blood transfusion     Past Surgical History  Procedure Laterality Date  .  Esophagogastroduodenoscopy  10/03/2012    Procedure: ESOPHAGOGASTRODUODENOSCOPY (EGD);  Surgeon: Theda Belfast, MD;  Location: [facility] ENDOSCOPY;  Service: Endoscopy;  Laterality: N/A;  . Colonoscopy  10/04/2012    Procedure: COLONOSCOPY;  Surgeon: Theda Belfast, MD;  Location: [facility] ENDOSCOPY;  Service: Endoscopy;  Laterality: N/A;    Family Hx:  Family History  Problem Relation Age of Onset  . Sudden death Mother   . Sudden death Father     Social History:  reports that he has never smoked. He does not have any smokeless tobacco history on file. He reports that he does not drink alcohol or use illicit drugs.  Allergies: No Known Allergies  Medications: Prior to Admission medications   Medication Sig Start Date End Date Taking? Authorizing Provider  allopurinol (ZYLOPRIM) 100 MG tablet Take 100 mg by mouth 2 (two) times a week. 10/08/12  Yes [name], MD  amLODipine (NORVASC) 10 MG tablet Take 10 mg by mouth Daily.  08/15/12  Yes Historical Provider, MD  diphenhydramine-acetaminophen (TYLENOL PM) 25-500 MG TABS Take 1 tablet by mouth at bedtime as needed (for pain).   Yes Historical Provider, MD  ferrous sulfate 325 (65 FE) MG tablet Take 325 mg by mouth 2 (two) times daily with a meal.   Yes Historical Provider, MD  pantoprazole (PROTONIX) 20 MG tablet Take 20 mg by mouth 2 (two) times daily. 10/08/12  Yes Costin Gherghe, MD  sitaGLIPtin (JANUVIA) 50 MG tablet Take 50 mg by mouth daily.   Yes Historical Provider, MD  sodium chloride (OCEAN) 0.65 % nasal spray Place 1 spray into the nose as needed for congestion.   Yes Historical Provider, MD  telmisartan (MICARDIS) 80 MG tablet Take 80 mg by mouth daily.   Yes Historical Provider, MD    I have reviewed the patient's current medications.  Labs:  Results for orders placed during the hospital encounter of 11/29/12 (from the past 48 hour(s))  BASIC METABOLIC PANEL     Status: Abnormal   Collection Time    11/29/12  4:35 PM      Result  Value Range   Sodium 137  135 - 145 mEq/L   Potassium 5.5 (*) 3.5 - 5.1 mEq/L   Chloride 106  96 - 112 mEq/L   CO2 13 (*) 19 - 32 mEq/L   Glucose, Bld 156 (*) 70 - 99 mg/dL   BUN 073 (*) 6 -  23 mg/dL   Creatinine, Ser 1.61 (*) 0.50 - 1.35 mg/dL   Calcium 6.6 (*) 8.4 - 10.5 mg/dL   GFR calc non Af Amer 5 (*) >90 mL/min   GFR calc Af Amer 5 (*) >90 mL/min   Comment:            The eGFR has been calculated     using the CKD EPI equation.     This calculation has not been     validated in all clinical     situations.     eGFR's persistently     <90 mL/min signify     possible Chronic Kidney Disease.  CBC WITH DIFFERENTIAL     Status: Abnormal   Collection Time    11/29/12  4:35 PM      Result Value Range   WBC 4.5  4.0 - 10.5 K/uL   RBC 2.00 (*) 4.22 - 5.81 MIL/uL   Hemoglobin 6.1 (*) 13.0 - 17.0 g/dL   Comment: DELTA CHECK NOTED     REPEATED TO VERIFY     CRITICAL RESULT CALLED TO, READ BACK BY AND VERIFIED WITH:     J NEGRON RN 1704 11/29/12 A BROWNING   HCT 17.8 (*) 39.0 - 52.0 %   MCV 89.0  78.0 - 100.0 fL   MCH 30.5  26.0 - 34.0 pg   MCHC 34.3  30.0 - 36.0 g/dL   RDW 09.6 (*) 04.5 - 40.9 %   Platelets 107 (*) 150 - 400 K/uL   Comment: PLATELET COUNT CONFIRMED BY SMEAR     REPEATED TO VERIFY     SPECIMEN CHECKED FOR CLOTS   Neutrophils Relative 70  43 - 77 %   Lymphocytes Relative 19  12 - 46 %   Monocytes Relative 6  3 - 12 %   Eosinophils Relative 5  0 - 5 %   Basophils Relative 0  0 - 1 %   Neutro Abs 3.1  1.7 - 7.7 K/uL   Lymphs Abs 0.9  0.7 - 4.0 K/uL   Monocytes Absolute 0.3  0.1 - 1.0 K/uL   Eosinophils Absolute 0.2  0.0 - 0.7 K/uL   Basophils Absolute 0.0  0.0 - 0.1 K/uL  OCCULT BLOOD, POC DEVICE     Status: Abnormal   Collection Time    11/29/12  4:39 PM      Result Value Range   Fecal Occult Bld POSITIVE (*) NEGATIVE  TYPE AND SCREEN     Status: None   Collection Time    11/29/12  4:40 PM      Result Value Range   ABO/RH(D) AB POS     Antibody  Screen NEG     Sample Expiration 12/02/2012     Unit Number [id number]     Blood Component Type RED CELLS,LR     Unit division 00     Status of Unit ISSUED,FINAL     Transfusion Status OK TO TRANSFUSE     Crossmatch Result Compatible     Unit Number [id number]     Blood Component Type RED CELLS,LR     Unit division 00     Status of Unit ISSUED,FINAL     Transfusion Status OK TO TRANSFUSE     Crossmatch Result Compatible     Unit Number [id number]     Blood Component Type RED CELLS,LR     Unit division 00     Status of Unit ISSUED  Transfusion Status OK TO TRANSFUSE     Crossmatch Result Compatible    PREPARE RBC (CROSSMATCH)     Status: None   Collection Time    11/29/12  6:00 PM      Result Value Range   Order Confirmation ORDER PROCESSED BY BLOOD BANK    PREPARE RBC (CROSSMATCH)     Status: None   Collection Time    11/29/12  8:09 PM      Result Value Range   Order Confirmation ORDER PROCESSED BY BLOOD BANK    MRSA PCR SCREENING     Status: None   Collection Time    11/29/12  8:18 PM      Result Value Range   MRSA by PCR NEGATIVE  NEGATIVE   Comment:            The GeneXpert MRSA Assay (FDA     approved for NASAL specimens     only), is one component of a     comprehensive MRSA colonization     surveillance program. It is not     intended to diagnose MRSA     infection nor to guide or     monitor treatment for     MRSA infections.  GLUCOSE, CAPILLARY     Status: None   Collection Time    11/29/12 10:02 PM      Result Value Range   Glucose-Capillary 95  70 - 99 mg/dL   Comment 1 Notify RN    BASIC METABOLIC PANEL     Status: Abnormal   Collection Time    11/30/12  4:50 AM      Result Value Range   Sodium 140  135 - 145 mEq/L   Potassium 5.2 (*) 3.5 - 5.1 mEq/L   Chloride 108  96 - 112 mEq/L   CO2 12 (*) 19 - 32 mEq/L   Glucose, Bld 90  70 - 99 mg/dL   BUN 161 (*) 6 - 23 mg/dL   Creatinine, Ser 0.96 (*) 0.50 - 1.35 mg/dL   Calcium 6.7 (*) 8.4  - 10.5 mg/dL   GFR calc non Af Amer 5 (*) >90 mL/min   GFR calc Af Amer 5 (*) >90 mL/min   Comment:            The eGFR has been calculated     using the CKD EPI equation.     This calculation has not been     validated in all clinical     situations.     eGFR's persistently     <90 mL/min signify     possible Chronic Kidney Disease.  CBC     Status: Abnormal   Collection Time    11/30/12  4:50 AM      Result Value Range   WBC 5.0  4.0 - 10.5 K/uL   RBC 2.84 (*) 4.22 - 5.81 MIL/uL   Hemoglobin 8.6 (*) 13.0 - 17.0 g/dL   Comment: POST TRANSFUSION SPECIMEN   HCT 24.8 (*) 39.0 - 52.0 %   MCV 87.3  78.0 - 100.0 fL   MCH 30.3  26.0 - 34.0 pg   MCHC 34.7  30.0 - 36.0 g/dL   RDW 04.5 (*) 40.9 - 81.1 %   Platelets 93 (*) 150 - 400 K/uL   Comment: CONSISTENT WITH PREVIOUS RESULT  GLUCOSE, CAPILLARY     Status: None   Collection Time    11/30/12  9:01 AM  Result Value Range   Glucose-Capillary 81  70 - 99 mg/dL   Comment 1 Notify RN       ROS: Is negative for fevers, chills, edema, nausea, vomiting , weight loss. Positive for see below  A comprehensive review of systems was negative except for: Constitutional: positive for fatigue Respiratory: positive for dyspnea on exertion Gastrointestinal: positive for change in bowel habits Musculoskeletal: positive for arthralgias  Physical Exam: Filed Vitals:   11/30/12 1200  BP: 165/72  Pulse: 81  Temp: 97.5 F (36.4 C)  Resp: 16     General: well developed black male who appears much younger than his stated age.  He exercises daily HEENT: PERRLA, EOMI, mucous membranes moist and without lesion Neck: no JVD, no bruits or lymphadenopathy Heart: RRR, no murmer, gallop or rub Lungs: mostly clear, no wheezes Abdomen: soft, non tender, non distended Extremities: minimal edema, does have tophi from gout Skin: warm and dry Neuro: alert and oriented, exam is non focal.   Well developed right upper arm AVF good thrill and bruit.   Placed by Dr. Wyn Quaker "about a year ago "  Assessment/Plan: 79 year old BM with advanced CKD approaching dialysis.  He is admitted with acute on chronic anemia.   1.Renal- advanced CKD likely secondary to diabetes and hypertension. He had been managed well by Dr. Bascom Levels and has an access in place which would be ready for use. He actually doesn't appear to have any indications for dialysis at this time. Hopefully that continues to be the case and we will just get him established as an outpatient in our clinic. 2. Hypertension/volume  - blood pressure is a little bit on the high side. He is receiving continuous IV fluids which I will decreased to [facility] He was on Micardis as an outpatient which I would not continue at this time. However, once taking by mouth would re initiate Norvasc. 4. Anemia  - acute on chronic. Has a history of AVMs and had a rather significant GI bleed in January of this year. He is status post transfusion with improvement in hemoglobin. I will reinitiate Aranesp and we will continue to administer it as an outpatient. I will give a dose of DDAVP for "uremic platelets" . Management of the GI bleed as per the primary service. 5. secondary hyperparathyroidism-I will check PTH and phosphorus and act accordingly.  Thank you very much for this consultation. We will continue to follow with you.   Asael Pann A 11/30/2012, 12:15 PM

## 2012-11-30 NOTE — Interval H&P Note (Signed)
History and Physical Interval Note:  11/30/2012 1:16 PM  [name]  has presented today for surgery, with the diagnosis of Anemia and history of AVMs  The various methods of treatment have been discussed with the patient and family. After consideration of risks, benefits and other options for treatment, the patient has consented to  Procedure(s): COLONOSCOPY WITH ESOPHAGOGASTRODUODENOSCOPY (EGD) (Left) as a surgical intervention .  The patient's history has been reviewed, patient examined, no change in status, stable for surgery.  I have reviewed the patient's chart and labs.  Questions were answered to the patient's satisfaction.     Carley Strickling D

## 2012-11-30 NOTE — Progress Notes (Signed)
Inaccurate scoring of malnutrition screen noted.  Pt not assessed by RD at this time. Please consult RD if nutrition interventions warranted.  Loyce Dys, MS RD LDN Clinical Inpatient Dietitian Pager: [phone] Weekend/After hours pager: [phone]

## 2012-11-30 NOTE — Progress Notes (Signed)
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 Had appointment with his primary care physician on the date of admission and was subsequently sent to the emergency department because his hemoglobin was 6.1. His only complaint was of feeling generally weak. He states his stools are dark but he also takes iron supplementation orally. His usual gastroenterologist Dr. Elnoria Howard was notified of the patient's need for admission and concerns for GI bleeding. He was admitted to the step down unit.   Assessment/Plan: Active Problems:   Acute GI bleeding/history of AVM (arteriovenous malformation) of colon with hemorrhage -pt poor historian and today denies any sx's c/w GIB -GI/Dr. Elnoria Howard plans EGD/colonoscopy 3/27-NO ACTIVE BLEEDING (SEE BELOW RE: FULL REPORT) -GI recommends OP capsule endoscopy /ofc FU in 2 weeks    Acute blood loss anemia on Anemia in chronic kidney disease -possibility anemia could be due to chronic progression since was unable to obtain Aranesp OP due to his nephrologist's retirement -agree with resume of Aranesp -Hgb up to 8.6 after 3 units PRBC's -resume home iron PO once not NPO  -follow CBC     Progressive CKD (chronic kidney disease) stage 5, GFR less than 15 ml/min Melissa Noon /Metabolic acidosis, increased anion gap -appreciate renal assistance/good UOP and no uremic signs for now so no indication to initiate HD this admit  (already has AVF in place) -suspect slight worsening of BUN/Scr due to anemia -+/- begin oral bicarb replete/defer to renal -labs for secondary hyperparathyroidism obtained per renal    Hypertension -was on Micardis as an OP- renal rec dc in favor of Norvasc- can start once allowed PO's    Thrombocytopenia, unspecified -likely "uremic"  and consumptive in nature- follow -Renal starting DDAVP    Diabetes type 2, controlled / Hypoglycemia associated with diabetes -CBG down to 80 and pt w/ sx's- sts usual CBG 100-120 -IV Dextrose 50 x 1 amp -On Januvia pre admit and given renal function may do better on Lantus or Levemir   Gout -was on Allopurinol pre admit   DVT prophylaxis: SCDs Code Status: Full Family Communication: Patient Disposition Plan: Transfer to Telemetry Isolation: None  Consultants: Gastroenterology Nephrology  Procedures: EGD (11/30/12) IMPRESSION:  1) Normal EGD  RECOMMENDATIONS:  1) Proceed with the colonoscopy.  Colonoscopy (11/30/12) IMPRESSION:  1) Residual angiodysplastic lesion.  2) One old hemoclip identified with an adjacent small ulcer.  RECOMMENDATIONS:  1) Follow HGB.  2) Transfuse as necessary.  3) Follow up in the office in 2 weeks with an outpatient capsule endoscopy.  Antibiotics: None  HPI/Subjective: Patient awake sitting in bed. Complains of weakness and tremulousness secondary to low CBG. States no change in stool noting that "my stools are usually dark because of the iron intake". Denied any red blood in stool or any red emesis. Denies abdominal pain. Denies recent issues with shortness of breath, orthopnea or lower extremity edema. States has excellent urine output. Denies has to use diuretics to promote diuresis.   Objective: Blood pressure 165/72, pulse 81, temperature 97.9 F (36.6 C), temperature source Oral, resp. rate 10, height 6' (1.829 m), weight 92.4 kg (203 lb 11.3 oz), SpO2 100.00%.  Intake/Output Summary (Last 24 hours) at 11/30/12 1311 Last data filed at 11/30/12 1200  Gross per 24 hour  Intake   1498 ml  Output  1300 ml  Net    198 ml     Exam: General: No acute respiratory distress Lungs: Clear to auscultation bilaterally without wheezes or crackles, RA Cardiovascular: Regular rate and rhythm without murmur gallop or rub normal S1 and S2,  no JVD or peripheral edema Abdomen: Nontender, nondistended, soft, bowel sounds positive, no rebound, no ascites, no appreciable mass Musculoskeletal: No significant cyanosis, clubbing of bilateral lower extremities, no gouty changes Neurological: Alert and oriented x3 but also seems to have some difficulty in relating recent history, somewhat slow to respond-this could be combination of relative hypoglycemia and azotemia from currently elevated BUN, as all extremities x4 without any difficulty or focal deficits, cranial nerves II through XII grossly intact  Data Reviewed: Basic Metabolic Panel:  Recent Labs Lab 11/29/12 1635 11/30/12 0450  NA 137 140  K 5.5* 5.2*  CL 106 108  CO2 13* 12*  GLUCOSE 156* 90  BUN 127* 113*  CREATININE 9.65* 9.46*  CALCIUM 6.6* 6.7*   Liver Function Tests: No results found for this basename: AST, ALT, ALKPHOS, BILITOT, PROT, ALBUMIN,  in the last 168 hours No results found for this basename: LIPASE, AMYLASE,  in the last 168 hours No results found for this basename: AMMONIA,  in the last 168 hours CBC:  Recent Labs Lab 11/29/12 1635 11/30/12 0450  WBC 4.5 5.0  NEUTROABS 3.1  --   HGB 6.1* 8.6*  HCT 17.8* 24.8*  MCV 89.0 87.3  PLT 107* 93*   Cardiac Enzymes: No results found for this basename: CKTOTAL, CKMB, CKMBINDEX, TROPONINI,  in the last 168 hours BNP (last 3 results) No results found for this basename: PROBNP,  in the last 8760 hours CBG:  Recent Labs Lab 11/29/12 2202 11/30/12 0901 11/30/12 1033 11/30/12 1231  GLUCAP 95 81 113* 101*    Recent Results (from the past 240 hour(s))  MRSA PCR SCREENING     Status: None   Collection Time    11/29/12  8:18 PM      Result Value Range Status   MRSA by PCR NEGATIVE  NEGATIVE Final   Comment:            The GeneXpert MRSA Assay (FDA     approved for NASAL specimens     only), is one component of a     comprehensive MRSA colonization     surveillance program. It is not      intended to diagnose MRSA     infection nor to guide or     monitor treatment for     MRSA infections.     Studies:  Recent x-ray studies have been reviewed in detail by the Attending Physician  Scheduled Meds:  Reviewed in detail by the Attending Physician   Junious Silk, ANP [facility] Office  [phone] Pager [phone]  On-Call/Text Page:      [email or web address]      password [email or web address]  If 7PM-7AM, please contact night-coverage [email or web address] Password [facility] 11/30/2012, 1:11 PM   LOS: 1 day   I have examined the patient, reviewed the chart and modified the above note which I agree with.   Hassel Uphoff,MD [phone] 11/30/2012, 7:10 PM

## 2012-11-30 NOTE — Progress Notes (Signed)
Report called to [place], 5500 RN. Dent Plantz L

## 2012-11-30 NOTE — Op Note (Signed)
[facility] [address] [place], [address]   OPERATIVE PROCEDURE REPORT  PATIENT: Andrew, [name]  MR#: [id number] BIRTHDATE: 1932-03-19  GENDER: Male ENDOSCOPIST: Jeani Hawking, MD ASSISTANT:   Jamal Maes, RN Windell Hummingbird, technician PROCEDURE DATE: 11/30/2012 PROCEDURE:   Colonoscopy with control of bleeding ASA CLASS:   Class III INDICATIONS:Melena and anemia. MEDICATIONS: Versed 1mg  IV and Fentanyl 25 mcg IV  DESCRIPTION OF PROCEDURE:   After the risks benefits and alternatives of the procedure were thoroughly explained, informed consent was obtained.  A digital rectal exam revealed no abnormalities of the rectum.    The Pentax Adult Colon [id number] endoscope was introduced through the anus  and advanced to the cecum, which was identified by both the appendix and ileocecal valve , No adverse events experienced.    The quality of the prep was good. .  The instrument was then slowly withdrawn as the colon was fully examined.     COLON FINDINGS: A residual angiodysplastic lesion was found in the proximal colon.  APC was applied to this area.  No evidence of any overt bleeding.  A couple of small diverticula were noted.  In the same region, i.e., proximal colon, a prior hemoclip was noted to be intact.  Only a brief look was able to be obtained and a small ulceration from the prior APC treatment was identified.  It is uncertain if this has any role in the recurrent anemia/melena. Retroflexed views revealed internal/external hemorrhoids.     The scope was then withdrawn from the patient and the procedure terminated.  COMPLICATIONS: There were no complications.  IMPRESSION: 1) Residual angiodysplastic lesion. 2) One old hemoclip identified with an adjacent small ulcer.  RECOMMENDATIONS: 1) Follow HGB. 2) Transfuse as necessary. 3) Follow up in the office in 2 weeks with an outpatient  capsule endoscopy.    _______________________________ eSigned:  Jeani Hawking, MD 11/30/2012 2:25 PM   PATIENT NAME:  Andrew, [name] MR#: [id number]

## 2012-11-30 NOTE — Progress Notes (Signed)
Pt to transfer to 5509 via wheelchair, no IVF, no O2. VS stable at time of transfer. Pt notified family of new room number. Meds in chart. No current questions or complaints at this time.  Dannetta Lekas L

## 2012-11-30 NOTE — Progress Notes (Signed)
Pt informed RN about his b/s and how easy it is to fall too low with insulin. RN called night coverage and was told to hold insulin. Pt b/s is stable and he is resting.

## 2012-11-30 NOTE — H&P (View-Only) (Signed)
[facility] Renal Consultation Note  Requesting MD:  Rizwan Indication for Consultation: advanced CKD previously followed by Frazier, now with crt of 9  HPI: [name] is a 79 y.o. male with PMhx of HTN, DM, gout and recent history of GIB as well as advanced CKD followed by Frazier with AVF in place who is admitted with acute on chronic anemia with recurrent GIB.  We are asked to see him due to the fact that he has advanced CKD and Dr. Frazier has recently retired and he has not yet established with [facility].  His acces would be ready to use if needed but he denies any uremic symptoms and states that he has been doing fairly well, he denies uremic symptoms.  One thing that likely contributed to his acute on chronic anemia is that he has not been able to get his aranesp shots at short stay due to Dr. Frazier's retirement.    Creatinine, Ser  Date/Time Value Range Status  11/30/2012  4:50 AM 9.46* 0.50 - 1.35 mg/dL Final  11/29/2012  4:35 PM 9.65* 0.50 - 1.35 mg/dL Final  10/08/2012  6:47 AM 7.49* 0.50 - 1.35 mg/dL Final  10/07/2012  5:18 PM 7.32* 0.50 - 1.35 mg/dL Final  10/07/2012  5:30 AM 7.38* 0.50 - 1.35 mg/dL Final  10/06/2012  6:10 AM 7.29* 0.50 - 1.35 mg/dL Final  10/05/2012  6:40 AM 7.68* 0.50 - 1.35 mg/dL Final  10/04/2012  6:15 AM 7.93* 0.50 - 1.35 mg/dL Final  10/03/2012  6:10 AM 8.21* 0.50 - 1.35 mg/dL Final  10/02/2012  1:27 PM 8.50* 0.50 - 1.35 mg/dL Final  10/02/2012 11:30 AM 8.59* 0.50 - 1.35 mg/dL Final  09/04/2012 11:30 AM 7.28* 0.50 - 1.35 mg/dL Final  08/07/2012 11:15 AM 7.52* 0.50 - 1.35 mg/dL Final  07/10/2012 10:35 AM 6.77* 0.50 - 1.35 mg/dL Final  06/12/2012 11:58 AM 6.95* 0.50 - 1.35 mg/dL Final  05/15/2012 10:42 AM 6.76* 0.50 - 1.35 mg/dL Final  04/17/2012 11:21 AM 6.84* 0.50 - 1.35 mg/dL Final  03/17/2012 11:09 AM 6.73* 0.50 - 1.35 mg/dL Final  02/18/2012 11:01 AM 6.92* 0.50 - 1.35 mg/dL Final  01/21/2012  1:01 PM 6.31* 0.50 - 1.35 mg/dL Final  11/12/2011 11:08 AM 6.62*  0.50 - 1.35 mg/dL Final  10/15/2011 11:42 AM 5.88* 0.50 - 1.35 mg/dL Final  09/17/2011 11:01 AM 5.85* 0.50 - 1.35 mg/dL Final  08/06/2011 12:30 PM 5.34* 0.50 - 1.35 mg/dL Final  07/09/2011 10:33 AM 5.08* 0.50 - 1.35 mg/dL Final  06/18/2011 10:56 AM 6.04* 0.50 - 1.35 mg/dL Final  05/14/2011 10:49 AM 5.74* 0.50 - 1.35 mg/dL Final  04/09/2011 12:52 PM 5.33* 0.50 - 1.35 mg/dL Final  03/26/2011 12:50 PM 5.59* 0.50 - 1.35 mg/dL Final  02/26/2011 12:59 PM 4.95* 0.50 - 1.35 mg/dL Final     **Please note change in reference range.**  02/05/2011 12:29 PM 5.54* 0.4 - 1.5 mg/dL Final  01/01/2011 12:50 PM 5.30* 0.4 - 1.5 mg/dL Final  11/27/2010 12:23 PM 5.52* 0.4 - 1.5 mg/dL Final  10/30/2010 12:12 PM 5.58* 0.4 - 1.5 mg/dL Final  09/28/2010 12:20 PM 5.38* 0.4 - 1.5 mg/dL Final  08/18/2010 12:48 PM 5.53* 0.4 - 1.5 mg/dL Final  07/14/2010 12:58 PM 5.58* 0.4 - 1.5 mg/dL Final  06/09/2010 12:21 PM 6.17* 0.4 - 1.5 mg/dL Final  04/14/2010 12:50 PM 5.45* 0.4 - 1.5 mg/dL Final  03/24/2010 12:56 PM 5.19* 0.4 - 1.5 mg/dL Final  02/17/2010 12:41 PM 4.67* 0.4 - 1.5 mg/dL   Final  01/13/2010  2:36 PM 5.09* 0.4 - 1.5 mg/dL Final  12/16/2009 12:26 PM 4.96* 0.4 - 1.5 mg/dL Final  11/11/2009 12:33 PM 4.44* 0.4 - 1.5 mg/dL Final  10/07/2009  1:04 PM 4.66* 0.4 - 1.5 mg/dL Final  09/09/2009 12:40 PM 4.78* 0.4 - 1.5 mg/dL Final  08/12/2009  1:42 PM 5.12* 0.4 - 1.5 mg/dL Final  07/08/2009 12:37 PM 4.39* 0.4 - 1.5 mg/dL Final  06/03/2009 12:52 PM 4.71* 0.4 - 1.5 mg/dL Final  05/06/2009 12:37 PM 4.34* 0.4 - 1.5 mg/dL Final  03/25/2009 12:42 PM 3.95* 0.4 - 1.5 mg/dL Final  02/25/2009 12:34 PM 4.96* 0.4 - 1.5 mg/dL Final     PMHx:   Past Medical History  Diagnosis Date  . Hypertension   . Diabetes mellitus without complication   . Arthritis   . Renal disorder     chronic renal disease  . History of blood transfusion   . GI bleed   . History of blood transfusion     Past Surgical History  Procedure Laterality Date  .  Esophagogastroduodenoscopy  10/03/2012    Procedure: ESOPHAGOGASTRODUODENOSCOPY (EGD);  Surgeon: Patrick D Hung, MD;  Location: [facility];  Service: Endoscopy;  Laterality: N/A;  . Colonoscopy  10/04/2012    Procedure: COLONOSCOPY;  Surgeon: Patrick D Hung, MD;  Location: [facility]OSCOPY;  Service: Endoscopy;  Laterality: N/A;    Family Hx:  Family History  Problem Relation Age of Onset  . Sudden death Mother   . Sudden death Father     Social History:  reports that he has never smoked. He does not have any smokeless tobacco history on file. He reports that he does not drink alcohol or use illicit drugs.  Allergies: No Known Allergies  Medications: Prior to Admission medications   Medication Sig Start Date End Date Taking? Authorizing Provider  allopurinol (ZYLOPRIM) 100 MG tablet Take 100 mg by mouth 2 (two) times a week. 10/08/12  Yes Costin Gherghe, MD  amLODipine (NORVASC) 10 MG tablet Take 10 mg by mouth Daily.  08/15/12  Yes Historical Provider, MD  diphenhydramine-acetaminophen (TYLENOL PM) 25-500 MG TABS Take 1 tablet by mouth at bedtime as needed (for pain).   Yes Historical Provider, MD  ferrous sulfate 325 (65 FE) MG tablet Take 325 mg by mouth 2 (two) times daily with a meal.   Yes Historical Provider, MD  pantoprazole (PROTONIX) 20 MG tablet Take 20 mg by mouth 2 (two) times daily. 10/08/12  Yes Costin Gherghe, MD  sitaGLIPtin (JANUVIA) 50 MG tablet Take 50 mg by mouth daily.   Yes Historical Provider, MD  sodium chloride (OCEAN) 0.65 % nasal spray Place 1 spray into the nose as needed for congestion.   Yes Historical Provider, MD  telmisartan (MICARDIS) 80 MG tablet Take 80 mg by mouth daily.   Yes Historical Provider, MD    I have reviewed the patient's current medications.  Labs:  Results for orders placed during the hospital encounter of 11/29/12 (from the past 48 hour(s))  BASIC METABOLIC PANEL     Status: Abnormal   Collection Time    11/29/12  4:35 PM      Result  Value Range   Sodium 137  135 - 145 mEq/L   Potassium 5.5 (*) 3.5 - 5.1 mEq/L   Chloride 106  96 - 112 mEq/L   CO2 13 (*) 19 - 32 mEq/L   Glucose, Bld 156 (*) 70 - 99 mg/dL   BUN 127 (*) 6 -   23 mg/dL   Creatinine, Ser 9.65 (*) 0.50 - 1.35 mg/dL   Calcium 6.6 (*) 8.4 - 10.5 mg/dL   GFR calc non Af Amer 5 (*) >90 mL/min   GFR calc Af Amer 5 (*) >90 mL/min   Comment:            The eGFR has been calculated     using the CKD EPI equation.     This calculation has not been     validated in all clinical     situations.     eGFR's persistently     <90 mL/min signify     possible Chronic Kidney Disease.  CBC WITH DIFFERENTIAL     Status: Abnormal   Collection Time    11/29/12  4:35 PM      Result Value Range   WBC 4.5  4.0 - 10.5 K/uL   RBC 2.00 (*) 4.22 - 5.81 MIL/uL   Hemoglobin 6.1 (*) 13.0 - 17.0 g/dL   Comment: DELTA CHECK NOTED     REPEATED TO VERIFY     CRITICAL RESULT CALLED TO, READ BACK BY AND VERIFIED WITH:     J NEGRON RN 1704 11/29/12 A BROWNING   HCT 17.8 (*) 39.0 - 52.0 %   MCV 89.0  78.0 - 100.0 fL   MCH 30.5  26.0 - 34.0 pg   MCHC 34.3  30.0 - 36.0 g/dL   RDW 18.4 (*) 11.5 - 15.5 %   Platelets 107 (*) 150 - 400 K/uL   Comment: PLATELET COUNT CONFIRMED BY SMEAR     REPEATED TO VERIFY     SPECIMEN CHECKED FOR CLOTS   Neutrophils Relative 70  43 - 77 %   Lymphocytes Relative 19  12 - 46 %   Monocytes Relative 6  3 - 12 %   Eosinophils Relative 5  0 - 5 %   Basophils Relative 0  0 - 1 %   Neutro Abs 3.1  1.7 - 7.7 K/uL   Lymphs Abs 0.9  0.7 - 4.0 K/uL   Monocytes Absolute 0.3  0.1 - 1.0 K/uL   Eosinophils Absolute 0.2  0.0 - 0.7 K/uL   Basophils Absolute 0.0  0.0 - 0.1 K/uL  OCCULT BLOOD, POC DEVICE     Status: Abnormal   Collection Time    11/29/12  4:39 PM      Result Value Range   Fecal Occult Bld POSITIVE (*) NEGATIVE  TYPE AND SCREEN     Status: None   Collection Time    11/29/12  4:40 PM      Result Value Range   ABO/RH(D) AB POS     Antibody  Screen NEG     Sample Expiration 12/02/2012     Unit Number [id number]     Blood Component Type RED CELLS,LR     Unit division 00     Status of Unit ISSUED,FINAL     Transfusion Status OK TO TRANSFUSE     Crossmatch Result Compatible     Unit Number [id number]     Blood Component Type RED CELLS,LR     Unit division 00     Status of Unit ISSUED,FINAL     Transfusion Status OK TO TRANSFUSE     Crossmatch Result Compatible     Unit Number [id number]     Blood Component Type RED CELLS,LR     Unit division 00     Status of Unit ISSUED       Transfusion Status OK TO TRANSFUSE     Crossmatch Result Compatible    PREPARE RBC (CROSSMATCH)     Status: None   Collection Time    11/29/12  6:00 PM      Result Value Range   Order Confirmation ORDER PROCESSED BY BLOOD BANK    PREPARE RBC (CROSSMATCH)     Status: None   Collection Time    11/29/12  8:09 PM      Result Value Range   Order Confirmation ORDER PROCESSED BY BLOOD BANK    MRSA PCR SCREENING     Status: None   Collection Time    11/29/12  8:18 PM      Result Value Range   MRSA by PCR NEGATIVE  NEGATIVE   Comment:            The GeneXpert MRSA Assay (FDA     approved for NASAL specimens     only), is one component of a     comprehensive MRSA colonization     surveillance program. It is not     intended to diagnose MRSA     infection nor to guide or     monitor treatment for     MRSA infections.  GLUCOSE, CAPILLARY     Status: None   Collection Time    11/29/12 10:02 PM      Result Value Range   Glucose-Capillary 95  70 - 99 mg/dL   Comment 1 Notify RN    BASIC METABOLIC PANEL     Status: Abnormal   Collection Time    11/30/12  4:50 AM      Result Value Range   Sodium 140  135 - 145 mEq/L   Potassium 5.2 (*) 3.5 - 5.1 mEq/L   Chloride 108  96 - 112 mEq/L   CO2 12 (*) 19 - 32 mEq/L   Glucose, Bld 90  70 - 99 mg/dL   BUN 113 (*) 6 - 23 mg/dL   Creatinine, Ser 9.46 (*) 0.50 - 1.35 mg/dL   Calcium 6.7 (*) 8.4  - 10.5 mg/dL   GFR calc non Af Amer 5 (*) >90 mL/min   GFR calc Af Amer 5 (*) >90 mL/min   Comment:            The eGFR has been calculated     using the CKD EPI equation.     This calculation has not been     validated in all clinical     situations.     eGFR's persistently     <90 mL/min signify     possible Chronic Kidney Disease.  CBC     Status: Abnormal   Collection Time    11/30/12  4:50 AM      Result Value Range   WBC 5.0  4.0 - 10.5 K/uL   RBC 2.84 (*) 4.22 - 5.81 MIL/uL   Hemoglobin 8.6 (*) 13.0 - 17.0 g/dL   Comment: POST TRANSFUSION SPECIMEN   HCT 24.8 (*) 39.0 - 52.0 %   MCV 87.3  78.0 - 100.0 fL   MCH 30.3  26.0 - 34.0 pg   MCHC 34.7  30.0 - 36.0 g/dL   RDW 17.2 (*) 11.5 - 15.5 %   Platelets 93 (*) 150 - 400 K/uL   Comment: CONSISTENT WITH PREVIOUS RESULT  GLUCOSE, CAPILLARY     Status: None   Collection Time    11/30/12  9:01 AM        Result Value Range   Glucose-Capillary 81  70 - 99 mg/dL   Comment 1 Notify RN       ROS: Is negative for fevers, chills, edema, nausea, vomiting , weight loss. Positive for see below  A comprehensive review of systems was negative except for: Constitutional: positive for fatigue Respiratory: positive for dyspnea on exertion Gastrointestinal: positive for change in bowel habits Musculoskeletal: positive for arthralgias  Physical Exam: Filed Vitals:   11/30/12 1200  BP: 165/72  Pulse: 81  Temp: 97.5 F (36.4 C)  Resp: 16     General: well developed black male who appears much younger than his stated age.  He exercises daily HEENT: PERRLA, EOMI, mucous membranes moist and without lesion Neck: no JVD, no bruits or lymphadenopathy Heart: RRR, no murmer, gallop or rub Lungs: mostly clear, no wheezes Abdomen: soft, non tender, non distended Extremities: minimal edema, does have tophi from gout Skin: warm and dry Neuro: alert and oriented, exam is non focal.   Well developed right upper arm AVF good thrill and bruit.   Placed by Dr. Dew "about a year ago "  Assessment/Plan: 80 year old BM with advanced CKD approaching dialysis.  He is admitted with acute on chronic anemia.   1.Renal- advanced CKD likely secondary to diabetes and hypertension. He had been managed well by Dr. Frazier and has an access in place which would be ready for use. He actually doesn't appear to have any indications for dialysis at this time. Hopefully that continues to be the case and we will just get him established as an outpatient in our clinic. 2. Hypertension/volume  - blood pressure is a little bit on the high side. He is receiving continuous IV fluids which I will decreased to KVO. He was on Micardis as an outpatient which I would not continue at this time. However, once taking by mouth would re initiate Norvasc. 4. Anemia  - acute on chronic. Has a history of AVMs and had a rather significant GI bleed in January of this year. He is status post transfusion with improvement in hemoglobin. I will reinitiate Aranesp and we will continue to administer it as an outpatient. I will give a dose of DDAVP for "uremic platelets" . Management of the GI bleed as per the primary service. 5. secondary hyperparathyroidism-I will check PTH and phosphorus and act accordingly.  Thank you very much for this consultation. We will continue to follow with you.   Gizel Riedlinger A 11/30/2012, 12:15 PM      

## 2012-11-30 NOTE — Consult Note (Signed)
This is a brief consult note.  [name] is a 79 year old gentleman who is well-known to me.  I performed an EGD/Colonoscopy in the recent past for anemia with findings of proximal colonic AVMs and a couple of proximal small bowel AVMs.  All identified sites were cauterized with APC.  No active bleeding was noted at the time of the procedures.  He is well at this time, but he presented with melena and he was noted to have a mild decline in his HGB.    Plan: 1) Repeat EGD/Colonoscopy with probable APC.

## 2012-11-30 NOTE — Care Management Note (Addendum)
    Page 1 of 1   12/01/2012     2:45:58 PM   CARE MANAGEMENT NOTE 12/01/2012  Patient:  [name]   Account Number:  [id number]  Date Initiated:  11/30/2012  Documentation initiated by:  Junius Creamer  Subjective/Objective Assessment:   adm w hgb 6.1,weakness     Action/Plan:   lives w wife, pcp dr Adrian Saran bland   Anticipated DC Date:  12/01/2012   Anticipated DC Plan:  HOME/SELF CARE      DC Planning Services  CM consult      Choice offered to / List presented to:             Status of service:  Completed, signed off Medicare Important Message given?   (If response is "NO", the following Medicare IM given date fields will be blank) Date Medicare IM given:   Date Additional Medicare IM given:    Discharge Disposition:  HOME/SELF CARE  Per UR Regulation:  Reviewed for med. necessity/level of care/duration of stay  If discussed at Long Length of Stay Meetings, dates discussed:    Comments:  12/01/12 14:45 Letha Cape RN, BSN [phone] patient dc to home, no needs anticipated.  3/27 0920 debbie dowell rn,bsn

## 2012-11-30 NOTE — Op Note (Signed)
[facility] [address] [place], [address]   OPERATIVE PROCEDURE REPORT  PATIENT: [name], [name]  MR#: [id number] BIRTHDATE: [birth year]  GENDER: Male ENDOSCOPIST: Jeani Hawking, MD ASSISTANT:   Jamal Maes, RN and Windell Hummingbird, technician PROCEDURE DATE: 11/30/2012 PROCEDURE:   EGD, diagnostic ASA CLASS:   Class III INDICATIONS:Iron deficiency anemia and Melena. MEDICATIONS: Versed 5 mg IV and Fentanyl 50 mcg IV TOPICAL ANESTHETIC:   none  DESCRIPTION OF PROCEDURE:   After the risks benefits and alternatives of the procedure were thoroughly explained, informed consent was obtained.  The Pentax Gastroscope [id number]  endoscope was introduced through the mouth  and advanced to the second portion of the duodenum Without limitations.      The instrument was slowly withdrawn as the mucosa was fully examined.    FINDINGS: The upper, middle and distal third of the esophagus were carefully inspected and no abnormalities were noted.  The z-line was well seen at the GEJ.  The endoscope was pushed into the fundus which was normal including a retroflexed view.  The antrum, gastric body, first and second part of the duodenum were unremarkable. Retroflexed views revealed no abnormalities.     The scope was then withdrawn from the patient and the procedure terminated.  COMPLICATIONS: There were no complications. IMPRESSION: 1) Normal EGD  RECOMMENDATIONS: 1) Proceed with the colonoscopy.   _______________________________ eSignedJeani Hawking, MD 11/30/2012 1:38 PM

## 2012-12-01 ENCOUNTER — Encounter (HOSPITAL_COMMUNITY): Payer: Self-pay | Admitting: Gastroenterology

## 2012-12-01 LAB — RENAL FUNCTION PANEL
Albumin: 3.8 g/dL (ref 3.5–5.2)
Calcium: 6.6 mg/dL — ABNORMAL LOW (ref 8.4–10.5)
Chloride: 111 mEq/L (ref 96–112)
Creatinine, Ser: 9.34 mg/dL — ABNORMAL HIGH (ref 0.50–1.35)
GFR calc non Af Amer: 5 mL/min — ABNORMAL LOW (ref >90)
Phosphorus: 7.6 mg/dL — ABNORMAL HIGH (ref 2.3–4.6)

## 2012-12-01 LAB — TYPE AND SCREEN
Antibody Screen: NEGATIVE
Unit division: 0

## 2012-12-01 LAB — CBC
MCHC: 34.9 g/dL (ref 30.0–36.0)
MCV: 86.4 fL (ref 78.0–100.0)
Platelets: 88 10*3/uL — ABNORMAL LOW (ref 150–400)
RDW: 17.3 % — ABNORMAL HIGH (ref 11.5–15.5)
WBC: 4 10*3/uL (ref 4.0–10.5)

## 2012-12-01 LAB — HEMOGLOBIN A1C: Mean Plasma Glucose: 111 mg/dL (ref <117)

## 2012-12-01 MED ORDER — CALCIUM CARBONATE ANTACID 500 MG PO CHEW: 1.0000 | CHEWABLE_TABLET | Freq: Three times a day (TID) | ORAL | Status: DC

## 2012-12-01 MED ORDER — SODIUM BICARBONATE 650 MG PO TABS
650.0000 mg | ORAL_TABLET | Freq: Two times a day (BID) | ORAL | Status: DC
  Administered 2012-12-01: 650 mg via ORAL
  Filled 2012-12-01 (×2): qty 1

## 2012-12-01 MED ORDER — SODIUM BICARBONATE 650 MG PO TABS: 650.0000 mg | ORAL_TABLET | Freq: Two times a day (BID) | ORAL | Status: DC

## 2012-12-01 MED ORDER — CALCIUM CARBONATE ANTACID 500 MG PO CHEW
1.0000 | CHEWABLE_TABLET | Freq: Three times a day (TID) | ORAL | Status: DC
Start: 2012-12-01 — End: 2012-12-01
  Administered 2012-12-01: 200 mg via ORAL
  Filled 2012-12-01 (×3): qty 1

## 2012-12-01 NOTE — Discharge Summary (Signed)
PATIENT DETAILS Name: [name] Age: 79 y.o. Sex: male Date of Birth: 1932-08-23 MRN: [id number]. Admit Date: 11/29/2012 Admitting Physician: Henderson Cloud, MD WUJ:WJXBJ,YNWGN J, MD  Recommendations for Outpatient Follow-up:  1. Please monitor hemoglobin and platelet count-has been restarted on Aranesp 2. Monitor creatinine, calcium, bicarbonate and phosphorus levels-would likely need hemodialysis to be initiated in the near future.  PRIMARY DISCHARGE DIAGNOSIS:  Active Problems:   Acute GI bleeding   Progressive CKD (chronic kidney disease) stage 5, GFR less than 15 ml/min   Hypertension   Acute blood loss anemia   Hyperkalemia   history of AVM (arteriovenous malformation) of colon with hemorrhage   Metabolic acidosis, increased anion gap   Anemia in chronic kidney disease   Thrombocytopenia, unspecified   Diabetes type 2, controlled   Hypoglycemia associated with diabetes      PAST MEDICAL HISTORY: Past Medical History  Diagnosis Date  . Hypertension   . Diabetes mellitus without complication   . Arthritis   . Renal disorder     chronic renal disease  . History of blood transfusion   . GI bleed   . History of blood transfusion     DISCHARGE MEDICATIONS:   Medication List    STOP taking these medications       sitaGLIPtin 50 MG tablet  Commonly known as:  JANUVIA     telmisartan 80 MG tablet  Commonly known as:  MICARDIS      TAKE these medications       allopurinol 100 MG tablet  Commonly known as:  ZYLOPRIM  Take 100 mg by mouth 2 (two) times a week.     amLODipine 10 MG tablet  Commonly known as:  NORVASC  Take 10 mg by mouth Daily.     calcium carbonate 500 MG chewable tablet  Commonly known as:  TUMS - dosed in mg elemental calcium  Chew 1 tablet (200 mg of elemental calcium total) by mouth 3 (three) times daily with meals.     diphenhydramine-acetaminophen 25-500 MG Tabs  Commonly known as:  TYLENOL PM  Take 1 tablet by mouth  at bedtime as needed (for pain).     ferrous sulfate 325 (65 FE) MG tablet  Take 325 mg by mouth 2 (two) times daily with a meal.     pantoprazole 20 MG tablet  Commonly known as:  PROTONIX  Take 20 mg by mouth 2 (two) times daily.     sodium bicarbonate 650 MG tablet  Take 1 tablet (650 mg total) by mouth 2 (two) times daily.     sodium chloride 0.65 % nasal spray  Commonly known as:  OCEAN  Place 1 spray into the nose as needed for congestion.         BRIEF HPI:  See H&P, Labs, Consult and Test reports for all details in brief,80 y/o man with h/o recurrent GI bleed 2/2 duodenal and colonic AVMs. Was just here in Feb for this reason. He went to see his PCP on day of admission and was told to come to the ED because his Hb was 6.1. He feels generally weak but has no other symptoms. He has noted that his stools have been darker than usual.  CONSULTATIONS:   GI and nephrology  PERTINENT RADIOLOGIC STUDIES: No results found.   PERTINENT LAB RESULTS: CBC:  Recent Labs  11/30/12 1734 12/01/12 0620  WBC 4.7 4.0  HGB 8.4* 8.0*  HCT 24.9* 22.9*  PLT 98* 88*  CMET CMP     Component Value Date/Time   NA 144 12/01/2012 0620   K 4.7 12/01/2012 0620   CL 111 12/01/2012 0620   CO2 15* 12/01/2012 0620   GLUCOSE 105* 12/01/2012 0620   BUN 112* 12/01/2012 0620   CREATININE 9.34* 12/01/2012 0620   CALCIUM 6.6* 12/01/2012 0620   PROT 7.2 10/02/2012 1327   ALBUMIN 3.8 12/01/2012 0620   AST 18 10/02/2012 1327   ALT 10 10/02/2012 1327   ALKPHOS 45 10/02/2012 1327   BILITOT 0.3 10/02/2012 1327   GFRNONAA 5* 12/01/2012 0620   GFRAA 5* 12/01/2012 0620    GFR Estimated Creatinine Clearance: 7.2 ml/min (by C-G formula based on Cr of 9.34). No results found for this basename: LIPASE, AMYLASE,  in the last 72 hours No results found for this basename: CKTOTAL, CKMB, CKMBINDEX, TROPONINI,  in the last 72 hours No components found with this basename: POCBNP,  No results found for this basename:  DDIMER,  in the last 72 hours No results found for this basename: HGBA1C,  in the last 72 hours No results found for this basename: CHOL, HDL, LDLCALC, TRIG, CHOLHDL, LDLDIRECT,  in the last 72 hours No results found for this basename: TSH, T4TOTAL, FREET3, T3FREE, THYROIDAB,  in the last 72 hours No results found for this basename: VITAMINB12, FOLATE, FERRITIN, TIBC, IRON, RETICCTPCT,  in the last 72 hours Coags: No results found for this basename: PT, INR,  in the last 72 hours Microbiology: Recent Results (from the past 240 hour(s))  MRSA PCR SCREENING     Status: None   Collection Time    11/29/12  8:18 PM      Result Value Range Status   MRSA by PCR NEGATIVE  NEGATIVE Final   Comment:            The GeneXpert MRSA Assay (FDA     approved for NASAL specimens     only), is one component of a     comprehensive MRSA colonization     surveillance program. It is not     intended to diagnose MRSA     infection nor to guide or     monitor treatment for     MRSA infections.     BRIEF HOSPITAL COURSE:   Active Problems:   Acute GI bleeding/history of AVM (arteriovenous malformation) of colon with hemorrhage - Patient was admitted, and transfuse 2 units of PRBC. GI was consulted, patient was seen in consult by Dr. Elnoria Howard. Patient underwent a endoscopy on 3/27 which did not show any significant findings, he subsequently underwent a colonoscopy on the same day which showed a angiodysplastic lesion in the proximal colon,APC was applied to this area. A small bowel capsule endoscopy was contemplated however for now plans are to give him erythropoietin and see if his hemoglobin stabilizes. Dr. Elnoria Howard will followup the patient in 2 weeks, if still severely anemic then the issue of capsule endoscopy can be addressed at that point in time.  Acute blood loss anemia on Anemia in chronic kidney disease - I personally think that this anemia is due to progression of his chronic kidney disease-but he could  have had a recent subacute GI bleed as well. Unfortunately due to his nephrologists recent retirement patient was unable to obtain Aranesp OP  -Hgb up to 8.6 after 3 units PRBC's- slightly down turning to 8.0 today-but no overt evidence of GI bleeding including melanotic stools or hematemesis. - Aranesp has been resumed- hemoglobin  and hematocrit will be followed by Dr. and Dr. Kathrene Bongo in the outpatient setting as well as his primary care practitioner. Aranesp injections will be arranged in the outpatient setting by Dr. Kathrene Bongo.  Progressive CKD (chronic kidney disease) stage 5, GFR less than 15 ml/min /Hyperkalemia /Metabolic acidosis, increased anion gap - Patient was seen by Dr. Kathrene Bongo during this admission, even though patient has slight worsening of his creatinine there was no indication to start hemodialysis during this inpatient stay. - On discharge, patient will have close followup with Dr. Kathrene Bongo in the outpatient setting, will be discharged on sodium bicarbonate tablets and TUMS.  Hypertension  -was on Micardis as an OP- renal rec dc in favor of Norvasc  Thrombocytopenia, unspecified  -likely "uremic" and consumptive in nature- follow  - Patient was given a DDAVP while inpatient. Platelet count on discharge 88. Will need close CBC followup in the outpatient setting  Diabetes type 2, controlled / Hypoglycemia associated with diabetes -On Januvia pre admit - however sugars here in the hospital have been on the lower side. - We will discharge him without any oral hypoglycemic agents and monitor his A1c in CBGs-can start low dose glipizide or insulin if CBGs are found to be increasing in outpatient followup  Gout  -was on Allopurinol pre admit      TODAY-DAY OF DISCHARGE:  Subjective:   Tajee Golding today has no headache,no chest abdominal pain,no new weakness tingling or numbness, feels much better wants to go home today.   Objective:   Blood pressure  151/60, pulse 83, temperature 97.9 F (36.6 C), temperature source Oral, resp. rate 20, height 6' (1.829 m), weight 92.4 kg (203 lb 11.3 oz), SpO2 99.00%.  Intake/Output Summary (Last 24 hours) at 12/01/12 1119 Last data filed at 12/01/12 0845  Gross per 24 hour  Intake    680 ml  Output   1751 ml  Net  -1071 ml    Exam Awake Alert, Oriented *3, No new F.N deficits, Normal affect [place].AT,PERRAL Supple Neck,No JVD, No cervical lymphadenopathy appriciated.  Symmetrical Chest wall movement, Good air movement bilaterally, CTAB RRR,No Gallops,Rubs or new Murmurs, No Parasternal Heave +ve B.Sounds, Abd Soft, Non tender, No organomegaly appriciated, No rebound -guarding or rigidity. No Cyanosis, Clubbing or edema, No new Rash or bruise  DISCHARGE CONDITION: Stable  DISPOSITION: HOME  DISCHARGE INSTRUCTIONS:    Activity:  As tolerated   Diet recommendation: Diabetic Diet Heart Healthy diet Renal Diet      Follow-up Information   Follow up with Geraldo Pitter, MD. Schedule an appointment as soon as possible for a visit in 1 week.   Contact information:   [address] [place] [address] [phone]       Schedule an appointment as soon as possible for a visit with Cecille Aver, MD.   Contact information:   [address] [place] [address] [phone]      Total Time spent on discharge equals 45 minutes.  SignedJeoffrey Massed 12/01/2012 11:19 AM

## 2012-12-01 NOTE — Progress Notes (Signed)
Subjective:  No new complaints today, no longer having dark stools, GI not planning on any procedure this admit.  hgb drifting down, renal fxn poor but stable Objective Vital signs in last 24 hours: Filed Vitals:   11/30/12 1600 11/30/12 1813 11/30/12 2127 12/01/12 0500  BP: 152/66 167/78 167/78 151/60  Pulse: 80 89 96 83  Temp: 97.5 F (36.4 C) 97.7 F (36.5 C) 98.1 F (36.7 C) 97.9 F (36.6 C)  TempSrc: Oral  Oral Oral  Resp: 17 16 18 20   Height:      Weight:      SpO2: 100% 100% 99% 99%   Weight change:   Intake/Output Summary (Last 24 hours) at 12/01/12 1104 Last data filed at 12/01/12 0845  Gross per 24 hour  Intake    680 ml  Output   1751 ml  Net  -1071 ml   Labs: Basic Metabolic Panel:  Recent Labs Lab 11/29/12 1635 11/30/12 0450 12/01/12 0620  NA 137 140 144  K 5.5* 5.2* 4.7  CL 106 108 111  CO2 13* 12* 15*  GLUCOSE 156* 90 105*  BUN 127* 113* 112*  CREATININE 9.65* 9.46* 9.34*  CALCIUM 6.6* 6.7* 6.6*  PHOS  --   --  7.6*   Liver Function Tests:  Recent Labs Lab 12/01/12 0620  ALBUMIN 3.8   No results found for this basename: LIPASE, AMYLASE,  in the last 168 hours No results found for this basename: AMMONIA,  in the last 168 hours CBC:  Recent Labs Lab 11/29/12 1635 11/30/12 0450 11/30/12 1734 12/01/12 0620  WBC 4.5 5.0 4.7 4.0  NEUTROABS 3.1  --   --   --   HGB 6.1* 8.6* 8.4* 8.0*  HCT 17.8* 24.8* 24.9* 22.9*  MCV 89.0 87.3 88.3 86.4  PLT 107* 93* 98* 88*   Cardiac Enzymes: No results found for this basename: CKTOTAL, CKMB, CKMBINDEX, TROPONINI,  in the last 168 hours CBG:  Recent Labs Lab 11/30/12 1231 11/30/12 1735 11/30/12 2130 11/30/12 2236 12/01/12 0719  GLUCAP 101* 110* 213* 165* 101*    Iron Studies: No results found for this basename: IRON, TIBC, TRANSFERRIN, FERRITIN,  in the last 72 hours Studies/Results: No results found. Medications: Infusions:    Scheduled Medications: . amLODipine  10 mg Oral Daily  .  darbepoetin (ARANESP) injection - NON-DIALYSIS  100 mcg Subcutaneous Q Thu-1800  . insulin aspart  0-5 Units Subcutaneous QHS  . insulin aspart  0-9 Units Subcutaneous TID WC  . sodium chloride  3 mL Intravenous Q12H  . sodium chloride  3 mL Intravenous Q12H    have reviewed scheduled and prn medications.  Physical Exam: General: NAD, very alert Heart: RRR Lungs: clr Abdomen: soft, non tender Extremities: no signif edema Dialysis Access: R upper arm AVF, good thrill and bruit.    IAssessment/Plan: 79 year old BM with advanced CKD approaching dialysis. He is admitted with acute on chronic anemia.  1.Renal- advanced CKD likely secondary to diabetes and hypertension. He had been managed well by Dr. Bascom Levels and has an access in place which would be ready for use. He actually doesn't appear to have any indications for dialysis at this time. Hopefully that continues to be the case and we will just get him established as an outpatient in our clinic.  2. Hypertension/volume - blood pressure is a little bit on the high side. He is no longer receiving continuous IV fluids. He was on Micardis as an outpatient which I would not  resume.  Norvasc has been restarted 4. Anemia - acute on chronic. Has a history of AVMs and had a rather significant GI bleed in January of this year. He is status post transfusion with improvement in hemoglobin. I will reinitiate Aranesp and we will continue to administer it as an outpatient. I will give a dose of DDAVP for "uremic platelets" . Management of the GI bleed as per the primary service.  5. secondary hyperparathyroidism-I will check PTH and phosphorus and act accordingly - he needs a binder as well, Tums ultra TID with meals- have informed hosps for discharge summary as well as starting sodium bicarb 650 BID  I have been informed by the hosps that he will be discharged today.  We will call pt with OP appt and information on injections through short stay.       Norman Bier A   12/01/2012,11:04 AM  LOS: 2 days

## 2012-12-01 NOTE — Progress Notes (Signed)
NURSING PROGRESS NOTE  [name] [id number] Discharge Data: 12/01/2012 2:58 PM Attending Provider: No att. providers found [name],[name] [name], MD     [name] to be D/C'd Home per MD order.  Discussed with the patient the After Visit Summary and all questions fully answered. All IV's discontinued with no bleeding noted. All belongings returned to patient for patient to take home.   Last Vital Signs:  Blood pressure 148/85, pulse 83, temperature 97.9 F (36.6 C), temperature source Oral, resp. rate 20, height 6' (1.829 m), weight 92.4 kg (203 lb 11.3 oz), SpO2 99.00%.  Discharge Medication List   Medication List    STOP taking these medications       sitaGLIPtin 50 MG tablet  Commonly known as:  JANUVIA     telmisartan 80 MG tablet  Commonly known as:  MICARDIS      TAKE these medications       allopurinol 100 MG tablet  Commonly known as:  ZYLOPRIM  Take 100 mg by mouth 2 (two) times a week.     amLODipine 10 MG tablet  Commonly known as:  NORVASC  Take 10 mg by mouth Daily.     calcium carbonate 500 MG chewable tablet  Commonly known as:  TUMS - dosed in mg elemental calcium  Chew 1 tablet (200 mg of elemental calcium total) by mouth 3 (three) times daily with meals.     diphenhydramine-acetaminophen 25-500 MG Tabs  Commonly known as:  TYLENOL PM  Take 1 tablet by mouth at bedtime as needed (for pain).     ferrous sulfate 325 (65 FE) MG tablet  Take 325 mg by mouth 2 (two) times daily with a meal.     pantoprazole 20 MG tablet  Commonly known as:  PROTONIX  Take 20 mg by mouth 2 (two) times daily.     sodium bicarbonate 650 MG tablet  Take 1 tablet (650 mg total) by mouth 2 (two) times daily.     sodium chloride 0.65 % nasal spray  Commonly known as:  OCEAN  Place 1 spray into the nose as needed for congestion.         Madelin Rear RN, MSN, [employer]

## 2012-12-01 NOTE — Progress Notes (Signed)
Subjective: No acute events.  He feels well at this time.  Objective: Vital signs in last 24 hours: Temp:  [97.5 F (36.4 C)-98.5 F (36.9 C)] 97.9 F (36.6 C) (03/28 0500) Pulse Rate:  [75-96] 83 (03/28 0500) Resp:  [10-99] 20 (03/28 0500) BP: (124-171)/(57-84) 151/60 mmHg (03/28 0500) SpO2:  [96 %-100 %] 99 % (03/28 0500) Last BM Date: 11/30/12  Intake/Output from previous day: 03/27 0701 - 03/28 0700 In: 468 [I.V.:418; IV Piggyback:50] Out: 1301 [Urine:1300; Stool:1] Intake/Output this shift: Total I/O In: -  Out: 450 [Urine:450]  General appearance: alert and no distress GI: soft, non-tender; bowel sounds normal; no masses,  no organomegaly  Lab Results:  Recent Labs  11/30/12 0450 11/30/12 1734 12/01/12 0620  WBC 5.0 4.7 4.0  HGB 8.6* 8.4* 8.0*  HCT 24.8* 24.9* 22.9*  PLT 93* 98* 88*   BMET  Recent Labs  11/29/12 1635 11/30/12 0450 12/01/12 0620  NA 137 140 144  K 5.5* 5.2* 4.7  CL 106 108 111  CO2 13* 12* 15*  GLUCOSE 156* 90 105*  BUN 127* 113* 112*  CREATININE 9.65* 9.46* 9.34*  CALCIUM 6.6* 6.7* 6.6*   LFT  Recent Labs  12/01/12 0620  ALBUMIN 3.8   PT/INR No results found for this basename: LABPROT, INR,  in the last 72 hours Hepatitis Panel No results found for this basename: HEPBSAG, HCVAB, HEPAIGM, HEPBIGM,  in the last 72 hours C-Diff No results found for this basename: CDIFFTOX,  in the last 72 hours Fecal Lactopherrin No results found for this basename: FECLLACTOFRN,  in the last 72 hours  Studies/Results: No results found.  Medications:  Scheduled: . amLODipine  10 mg Oral Daily  . darbepoetin (ARANESP) injection - NON-DIALYSIS  100 mcg Subcutaneous Q Thu-1800  . insulin aspart  0-5 Units Subcutaneous QHS  . insulin aspart  0-9 Units Subcutaneous TID WC  . sodium chloride  3 mL Intravenous Q12H  . sodium chloride  3 mL Intravenous Q12H   Continuous:   Assessment/Plan: 1) Anemia secondary to GI blood loss and renal  failure. 2) CRF.   He is stable at this time, but there was a mild trending down of his HGB.  I was not able to detect any overt bleeding sites.  He reports that he was without EPO for a couple of months after Dr. Bascom Levels retired.  When he was on EPO his HGB was able to climb up to 12.  Plan: 1) Follow HGB and transfuse as necessary. 2) Establish a new renal physician and obtain regular EPO. 3) Follow up in the office in 2 weeks.  He did have a capsule endo before and in light of the EPO issue, I may not repeat the procedure. 4) Signing off.  LOS: 2 days   Arwen Haseley D 12/01/2012, 10:08 AM

## 2012-12-07 ENCOUNTER — Other Ambulatory Visit (HOSPITAL_COMMUNITY): Payer: Self-pay | Admitting: *Deleted

## 2012-12-08 ENCOUNTER — Encounter (HOSPITAL_COMMUNITY)
Admission: RE | Admit: 2012-12-08 | Discharge: 2012-12-08 | Disposition: A | Discharge status: Home / Self Care | Payer: Medicare Other | Source: Ambulatory Visit | Attending: Nephrology | Admitting: Nephrology

## 2012-12-08 VITALS — BP 160/85 | HR 81 | Temp 97.1°F | Resp 18

## 2012-12-08 DIAGNOSIS — D631 Anemia in chronic kidney disease: Secondary | ICD-10-CM | POA: Insufficient documentation

## 2012-12-08 DIAGNOSIS — N189 Chronic kidney disease, unspecified: Secondary | ICD-10-CM | POA: Insufficient documentation

## 2012-12-08 MED ORDER — EPOETIN ALFA 20000 UNIT/ML IJ SOLN
INTRAMUSCULAR | Status: AC
  Administered 2012-12-08: 20000 [IU] via SUBCUTANEOUS
  Filled 2012-12-08: qty 1

## 2012-12-08 MED ORDER — EPOETIN ALFA 20000 UNIT/ML IJ SOLN: 20000.0000 [IU] | INTRAMUSCULAR | Status: DC

## 2012-12-15 ENCOUNTER — Encounter (HOSPITAL_COMMUNITY)
Admission: RE | Admit: 2012-12-15 | Discharge: 2012-12-15 | Disposition: A | Discharge status: Home / Self Care | Payer: Medicare Other | Source: Ambulatory Visit | Attending: Nephrology | Admitting: Nephrology

## 2012-12-15 VITALS — BP 173/71 | HR 73 | Temp 97.8°F | Resp 18

## 2012-12-15 DIAGNOSIS — N189 Chronic kidney disease, unspecified: Secondary | ICD-10-CM

## 2012-12-15 LAB — POCT HEMOGLOBIN-HEMACUE: Hemoglobin: 9.7 g/dL — ABNORMAL LOW (ref 13.0–17.0)

## 2012-12-15 MED ORDER — EPOETIN ALFA 20000 UNIT/ML IJ SOLN
INTRAMUSCULAR | Status: AC
  Filled 2012-12-15: qty 1

## 2012-12-15 MED ORDER — EPOETIN ALFA 20000 UNIT/ML IJ SOLN
20000.0000 [IU] | INTRAMUSCULAR | Status: DC
  Administered 2012-12-15: 20000 [IU] via SUBCUTANEOUS

## 2012-12-22 ENCOUNTER — Encounter (HOSPITAL_COMMUNITY)
Admission: RE | Admit: 2012-12-22 | Discharge: 2012-12-22 | Disposition: A | Discharge status: Home / Self Care | Payer: Medicare Other | Source: Ambulatory Visit | Attending: Nephrology | Admitting: Nephrology

## 2012-12-22 DIAGNOSIS — N189 Chronic kidney disease, unspecified: Secondary | ICD-10-CM

## 2012-12-22 LAB — RENAL FUNCTION PANEL
Albumin: 4.2 g/dL (ref 3.5–5.2)
Calcium: 7.3 mg/dL — ABNORMAL LOW (ref 8.4–10.5)
Chloride: 105 mEq/L (ref 96–112)
Creatinine, Ser: 9.46 mg/dL — ABNORMAL HIGH (ref 0.50–1.35)
GFR calc Af Amer: 5 mL/min — ABNORMAL LOW (ref >90)
GFR calc non Af Amer: 5 mL/min — ABNORMAL LOW (ref >90)
Sodium: 139 mEq/L (ref 135–145)

## 2012-12-22 LAB — IRON AND TIBC: TIBC: 271 ug/dL (ref 215–435)

## 2012-12-22 LAB — POCT HEMOGLOBIN-HEMACUE: Hemoglobin: 10 g/dL — ABNORMAL LOW (ref 13.0–17.0)

## 2012-12-22 MED ORDER — EPOETIN ALFA 20000 UNIT/ML IJ SOLN
INTRAMUSCULAR | Status: AC
  Administered 2012-12-22: 20000 [IU] via SUBCUTANEOUS
  Filled 2012-12-22: qty 1

## 2012-12-22 MED ORDER — EPOETIN ALFA 20000 UNIT/ML IJ SOLN: 20000.0000 [IU] | INTRAMUSCULAR | Status: DC

## 2012-12-27 ENCOUNTER — Other Ambulatory Visit (HOSPITAL_COMMUNITY): Payer: Self-pay | Admitting: *Deleted

## 2012-12-29 ENCOUNTER — Encounter (HOSPITAL_COMMUNITY)
Admission: RE | Admit: 2012-12-29 | Discharge: 2012-12-29 | Disposition: A | Discharge status: Home / Self Care | Payer: Medicare Other | Source: Ambulatory Visit | Attending: Nephrology | Admitting: Nephrology

## 2012-12-29 DIAGNOSIS — N189 Chronic kidney disease, unspecified: Secondary | ICD-10-CM

## 2012-12-29 LAB — HEMOGLOBIN A1C: Hgb A1c MFr Bld: 5.4 % (ref <5.7)

## 2012-12-29 LAB — POCT HEMOGLOBIN-HEMACUE: Hemoglobin: 10.7 g/dL — ABNORMAL LOW (ref 13.0–17.0)

## 2012-12-29 MED ORDER — EPOETIN ALFA 20000 UNIT/ML IJ SOLN: 20000.0000 [IU] | INTRAMUSCULAR | Status: DC

## 2012-12-29 MED ORDER — EPOETIN ALFA 20000 UNIT/ML IJ SOLN
INTRAMUSCULAR | Status: AC
  Administered 2012-12-29: 20000 [IU] via SUBCUTANEOUS
  Filled 2012-12-29: qty 1

## 2013-01-05 ENCOUNTER — Encounter (HOSPITAL_COMMUNITY)
Admission: RE | Admit: 2013-01-05 | Discharge: 2013-01-05 | Disposition: A | Discharge status: Home / Self Care | Payer: Medicare Other | Source: Ambulatory Visit | Attending: Nephrology | Admitting: Nephrology

## 2013-01-05 VITALS — BP 180/77 | HR 81 | Resp 18

## 2013-01-05 DIAGNOSIS — N039 Chronic nephritic syndrome with unspecified morphologic changes: Secondary | ICD-10-CM | POA: Insufficient documentation

## 2013-01-05 DIAGNOSIS — N189 Chronic kidney disease, unspecified: Secondary | ICD-10-CM | POA: Insufficient documentation

## 2013-01-05 DIAGNOSIS — D631 Anemia in chronic kidney disease: Secondary | ICD-10-CM | POA: Insufficient documentation

## 2013-01-05 LAB — POCT HEMOGLOBIN-HEMACUE
Hemoglobin: 10.6 g/dL — ABNORMAL LOW (ref 13.0–17.0)
Hemoglobin: 11.9 g/dL — ABNORMAL LOW (ref 13.0–17.0)

## 2013-01-05 MED ORDER — EPOETIN ALFA 20000 UNIT/ML IJ SOLN
INTRAMUSCULAR | Status: AC
  Filled 2013-01-05: qty 1

## 2013-01-05 MED ORDER — EPOETIN ALFA 20000 UNIT/ML IJ SOLN
20000.0000 [IU] | INTRAMUSCULAR | Status: DC
  Administered 2013-01-05: 20000 [IU] via SUBCUTANEOUS

## 2013-01-12 ENCOUNTER — Encounter (HOSPITAL_COMMUNITY): Payer: Medicare Other

## 2013-01-12 ENCOUNTER — Encounter (HOSPITAL_COMMUNITY): Payer: Self-pay | Admitting: *Deleted

## 2013-01-12 ENCOUNTER — Other Ambulatory Visit: Payer: Self-pay

## 2013-01-12 ENCOUNTER — Inpatient Hospital Stay (HOSPITAL_COMMUNITY)
Admission: EM | Admit: 2013-01-12 | Discharge: 2013-01-18 | DRG: 100 | Disposition: A | Discharge status: Home Health | Payer: Medicare Other | Attending: Internal Medicine | Admitting: Internal Medicine

## 2013-01-12 ENCOUNTER — Emergency Department (HOSPITAL_COMMUNITY): Payer: Medicare Other

## 2013-01-12 DIAGNOSIS — E8729 Other acidosis: Secondary | ICD-10-CM

## 2013-01-12 DIAGNOSIS — D631 Anemia in chronic kidney disease: Secondary | ICD-10-CM | POA: Diagnosis present

## 2013-01-12 DIAGNOSIS — K5521 Angiodysplasia of colon with hemorrhage: Secondary | ICD-10-CM

## 2013-01-12 DIAGNOSIS — N2581 Secondary hyperparathyroidism of renal origin: Secondary | ICD-10-CM | POA: Diagnosis present

## 2013-01-12 DIAGNOSIS — K922 Gastrointestinal hemorrhage, unspecified: Secondary | ICD-10-CM

## 2013-01-12 DIAGNOSIS — E872 Acidosis, unspecified: Secondary | ICD-10-CM | POA: Diagnosis present

## 2013-01-12 DIAGNOSIS — N189 Chronic kidney disease, unspecified: Secondary | ICD-10-CM

## 2013-01-12 DIAGNOSIS — E119 Type 2 diabetes mellitus without complications: Secondary | ICD-10-CM

## 2013-01-12 DIAGNOSIS — W19XXXA Unspecified fall, initial encounter: Secondary | ICD-10-CM

## 2013-01-12 DIAGNOSIS — S0993XA Unspecified injury of face, initial encounter: Secondary | ICD-10-CM

## 2013-01-12 DIAGNOSIS — M129 Arthropathy, unspecified: Secondary | ICD-10-CM | POA: Diagnosis present

## 2013-01-12 DIAGNOSIS — S0180XA Unspecified open wound of other part of head, initial encounter: Secondary | ICD-10-CM | POA: Diagnosis present

## 2013-01-12 DIAGNOSIS — I120 Hypertensive chronic kidney disease with stage 5 chronic kidney disease or end stage renal disease: Secondary | ICD-10-CM | POA: Diagnosis present

## 2013-01-12 DIAGNOSIS — E875 Hyperkalemia: Secondary | ICD-10-CM

## 2013-01-12 DIAGNOSIS — S0181XA Laceration without foreign body of other part of head, initial encounter: Secondary | ICD-10-CM

## 2013-01-12 DIAGNOSIS — D696 Thrombocytopenia, unspecified: Secondary | ICD-10-CM

## 2013-01-12 DIAGNOSIS — R32 Unspecified urinary incontinence: Secondary | ICD-10-CM | POA: Diagnosis present

## 2013-01-12 DIAGNOSIS — E11649 Type 2 diabetes mellitus with hypoglycemia without coma: Secondary | ICD-10-CM

## 2013-01-12 DIAGNOSIS — N185 Chronic kidney disease, stage 5: Secondary | ICD-10-CM

## 2013-01-12 DIAGNOSIS — I10 Essential (primary) hypertension: Secondary | ICD-10-CM | POA: Diagnosis present

## 2013-01-12 DIAGNOSIS — S0990XA Unspecified injury of head, initial encounter: Secondary | ICD-10-CM

## 2013-01-12 DIAGNOSIS — D62 Acute posthemorrhagic anemia: Secondary | ICD-10-CM

## 2013-01-12 DIAGNOSIS — Z992 Dependence on renal dialysis: Secondary | ICD-10-CM

## 2013-01-12 DIAGNOSIS — M109 Gout, unspecified: Secondary | ICD-10-CM

## 2013-01-12 DIAGNOSIS — R569 Unspecified convulsions: Principal | ICD-10-CM

## 2013-01-12 DIAGNOSIS — W010XXA Fall on same level from slipping, tripping and stumbling without subsequent striking against object, initial encounter: Secondary | ICD-10-CM | POA: Diagnosis present

## 2013-01-12 DIAGNOSIS — R55 Syncope and collapse: Secondary | ICD-10-CM

## 2013-01-12 DIAGNOSIS — N186 End stage renal disease: Secondary | ICD-10-CM

## 2013-01-12 LAB — CBC WITH DIFFERENTIAL/PLATELET
Eosinophils Relative: 1 % (ref 0–5)
HCT: 31.9 % — ABNORMAL LOW (ref 39.0–52.0)
Hemoglobin: 10.7 g/dL — ABNORMAL LOW (ref 13.0–17.0)
Lymphocytes Relative: 7 % — ABNORMAL LOW (ref 12–46)
Lymphs Abs: 0.5 10*3/uL — ABNORMAL LOW (ref 0.7–4.0)
MCV: 93.5 fL (ref 78.0–100.0)
Monocytes Absolute: 0.4 10*3/uL (ref 0.1–1.0)
RBC: 3.41 MIL/uL — ABNORMAL LOW (ref 4.22–5.81)
WBC: 7.7 10*3/uL (ref 4.0–10.5)

## 2013-01-12 LAB — URINALYSIS, ROUTINE W REFLEX MICROSCOPIC
Bilirubin Urine: NEGATIVE
Ketones, ur: NEGATIVE mg/dL
Nitrite: NEGATIVE
Protein, ur: >300 mg/dL — AB
Specific Gravity, Urine: 1.013 (ref 1.005–1.030)
Urobilinogen, UA: 0.2 mg/dL (ref 0.0–1.0)

## 2013-01-12 LAB — BASIC METABOLIC PANEL
CO2: 16 mEq/L — ABNORMAL LOW (ref 19–32)
Calcium: 6.3 mg/dL — CL (ref 8.4–10.5)
Glucose, Bld: 173 mg/dL — ABNORMAL HIGH (ref 70–99)
Sodium: 139 mEq/L (ref 135–145)

## 2013-01-12 LAB — TYPE AND SCREEN: Antibody Screen: NEGATIVE

## 2013-01-12 LAB — CBC
MCH: 32.4 pg (ref 26.0–34.0)
MCHC: 35.1 g/dL (ref 30.0–36.0)
MCV: 92.2 fL (ref 78.0–100.0)
Platelets: 122 10*3/uL — ABNORMAL LOW (ref 150–400)
RDW: 17 % — ABNORMAL HIGH (ref 11.5–15.5)

## 2013-01-12 LAB — URINE MICROSCOPIC-ADD ON

## 2013-01-12 LAB — TROPONIN I: Troponin I: <0.3 ng/mL (ref <0.30)

## 2013-01-12 LAB — PROTIME-INR: INR: 1.13 (ref 0.00–1.49)

## 2013-01-12 LAB — GLUCOSE, CAPILLARY: Glucose-Capillary: 188 mg/dL — ABNORMAL HIGH (ref 70–99)

## 2013-01-12 MED ORDER — ACETAMINOPHEN 650 MG RE SUPP: 650.0000 mg | Freq: Four times a day (QID) | RECTAL | Status: DC | PRN

## 2013-01-12 MED ORDER — FERROUS SULFATE 325 (65 FE) MG PO TABS
325.0000 mg | ORAL_TABLET | Freq: Two times a day (BID) | ORAL | Status: DC
  Filled 2013-01-12 (×3): qty 1

## 2013-01-12 MED ORDER — ACETAMINOPHEN 325 MG PO TABS: 650.0000 mg | ORAL_TABLET | Freq: Four times a day (QID) | ORAL | Status: DC | PRN

## 2013-01-12 MED ORDER — SALINE SPRAY 0.65 % NA SOLN
1.0000 | NASAL | Status: DC | PRN
  Filled 2013-01-12: qty 44

## 2013-01-12 MED ORDER — NEPRO/CARBSTEADY PO LIQD
237.0000 mL | Freq: Three times a day (TID) | ORAL | Status: DC | PRN
  Filled 2013-01-12: qty 237

## 2013-01-12 MED ORDER — INSULIN ASPART 100 UNIT/ML ~~LOC~~ SOLN
0.0000 [IU] | Freq: Three times a day (TID) | SUBCUTANEOUS | Status: DC
  Administered 2013-01-13: 2 [IU] via SUBCUTANEOUS
  Administered 2013-01-14: 3 [IU] via SUBCUTANEOUS

## 2013-01-12 MED ORDER — CALCIUM CARBONATE 1250 (500 CA) MG PO TABS
2.0000 | ORAL_TABLET | Freq: Three times a day (TID) | ORAL | Status: DC
  Administered 2013-01-13 – 2013-01-16 (×10): 1000 mg via ORAL
  Filled 2013-01-12 (×17): qty 2

## 2013-01-12 MED ORDER — PENTAFLUOROPROP-TETRAFLUOROETH EX AERO: 1.0000 "application " | INHALATION_SPRAY | CUTANEOUS | Status: DC | PRN

## 2013-01-12 MED ORDER — SODIUM CHLORIDE 0.9 % IJ SOLN: 3.0000 mL | INTRAMUSCULAR | Status: DC | PRN

## 2013-01-12 MED ORDER — ONDANSETRON HCL 4 MG PO TABS: 4.0000 mg | ORAL_TABLET | Freq: Four times a day (QID) | ORAL | Status: DC | PRN

## 2013-01-12 MED ORDER — AMLODIPINE BESYLATE 10 MG PO TABS
10.0000 mg | ORAL_TABLET | Freq: Every day | ORAL | Status: DC
  Administered 2013-01-13 – 2013-01-16 (×3): 10 mg via ORAL
  Filled 2013-01-12 (×5): qty 1

## 2013-01-12 MED ORDER — HEPARIN SODIUM (PORCINE) 1000 UNIT/ML DIALYSIS: 1000.0000 [IU] | INTRAMUSCULAR | Status: DC | PRN

## 2013-01-12 MED ORDER — HYDROXYZINE HCL 25 MG PO TABS
25.0000 mg | ORAL_TABLET | Freq: Three times a day (TID) | ORAL | Status: DC | PRN
  Administered 2013-01-14 (×2): 25 mg via ORAL
  Filled 2013-01-12 (×4): qty 1

## 2013-01-12 MED ORDER — SODIUM CHLORIDE 0.9 % IV SOLN: 100.0000 mL | INTRAVENOUS | Status: DC | PRN

## 2013-01-12 MED ORDER — SODIUM CHLORIDE 0.9 % IV SOLN
250.0000 mL | INTRAVENOUS | Status: DC | PRN
Start: 2013-01-12 — End: 2013-01-18

## 2013-01-12 MED ORDER — SALINE NASAL SPRAY 0.65 % NA SOLN: 1.0000 | NASAL | Status: DC | PRN

## 2013-01-12 MED ORDER — LIDOCAINE HCL (PF) 1 % IJ SOLN: 5.0000 mL | INTRAMUSCULAR | Status: DC | PRN

## 2013-01-12 MED ORDER — ACETAMINOPHEN 650 MG RE SUPP
650.0000 mg | Freq: Four times a day (QID) | RECTAL | Status: DC | PRN
  Filled 2013-01-12: qty 1

## 2013-01-12 MED ORDER — ALBUTEROL SULFATE (5 MG/ML) 0.5% IN NEBU: 2.5000 mg | INHALATION_SOLUTION | RESPIRATORY_TRACT | Status: DC | PRN

## 2013-01-12 MED ORDER — SODIUM BICARBONATE 650 MG PO TABS
650.0000 mg | ORAL_TABLET | Freq: Two times a day (BID) | ORAL | Status: DC
  Administered 2013-01-12: 650 mg via ORAL
  Filled 2013-01-12 (×3): qty 1

## 2013-01-12 MED ORDER — PANTOPRAZOLE SODIUM 40 MG PO TBEC
40.0000 mg | DELAYED_RELEASE_TABLET | Freq: Two times a day (BID) | ORAL | Status: DC
  Administered 2013-01-12 – 2013-01-18 (×11): 40 mg via ORAL
  Filled 2013-01-12 (×11): qty 1

## 2013-01-12 MED ORDER — HEPARIN SODIUM (PORCINE) 5000 UNIT/ML IJ SOLN
5000.0000 [IU] | Freq: Three times a day (TID) | INTRAMUSCULAR | Status: DC
  Administered 2013-01-12 – 2013-01-14 (×5): 5000 [IU] via SUBCUTANEOUS
  Filled 2013-01-12 (×8): qty 1

## 2013-01-12 MED ORDER — CALCIUM CARBONATE 1250 MG/5ML PO SUSP
500.0000 mg | Freq: Four times a day (QID) | ORAL | Status: DC | PRN
  Filled 2013-01-12: qty 5

## 2013-01-12 MED ORDER — DOCUSATE SODIUM 283 MG RE ENEM
1.0000 | ENEMA | RECTAL | Status: DC | PRN
  Filled 2013-01-12: qty 1

## 2013-01-12 MED ORDER — SODIUM CHLORIDE 0.9 % IJ SOLN
3.0000 mL | Freq: Two times a day (BID) | INTRAMUSCULAR | Status: DC
  Administered 2013-01-13 – 2013-01-17 (×4): 3 mL via INTRAVENOUS

## 2013-01-12 MED ORDER — RENA-VITE PO TABS
1.0000 | ORAL_TABLET | Freq: Every day | ORAL | Status: DC
  Administered 2013-01-13 – 2013-01-18 (×5): 1 via ORAL
  Filled 2013-01-12 (×7): qty 1

## 2013-01-12 MED ORDER — CAMPHOR-MENTHOL 0.5-0.5 % EX LOTN
1.0000 "application " | TOPICAL_LOTION | Freq: Three times a day (TID) | CUTANEOUS | Status: DC | PRN
  Administered 2013-01-14 (×2): 1 via TOPICAL
  Filled 2013-01-12 (×3): qty 222

## 2013-01-12 MED ORDER — ALTEPLASE 2 MG IJ SOLR
2.0000 mg | Freq: Once | INTRAMUSCULAR | Status: DC | PRN
  Filled 2013-01-12: qty 2

## 2013-01-12 MED ORDER — ALLOPURINOL 100 MG PO TABS
100.0000 mg | ORAL_TABLET | ORAL | Status: DC
  Filled 2013-01-12: qty 1

## 2013-01-12 MED ORDER — LIDOCAINE-PRILOCAINE 2.5-2.5 % EX CREA: 1.0000 "application " | TOPICAL_CREAM | CUTANEOUS | Status: DC | PRN

## 2013-01-12 MED ORDER — SODIUM CHLORIDE 0.9 % IJ SOLN
3.0000 mL | Freq: Two times a day (BID) | INTRAMUSCULAR | Status: DC
  Administered 2013-01-12 – 2013-01-18 (×7): 3 mL via INTRAVENOUS

## 2013-01-12 MED ORDER — ZOLPIDEM TARTRATE 5 MG PO TABS: 5.0000 mg | ORAL_TABLET | Freq: Every evening | ORAL | Status: DC | PRN

## 2013-01-12 MED ORDER — ONDANSETRON HCL 4 MG/2ML IJ SOLN: 4.0000 mg | Freq: Four times a day (QID) | INTRAMUSCULAR | Status: DC | PRN

## 2013-01-12 MED ORDER — SORBITOL 70 % SOLN: 30.0000 mL | Status: DC | PRN

## 2013-01-12 MED ORDER — CALCIUM CARBONATE ANTACID 500 MG PO CHEW
1.0000 | CHEWABLE_TABLET | Freq: Three times a day (TID) | ORAL | Status: DC
  Administered 2013-01-13 – 2013-01-16 (×9): 200 mg via ORAL
  Filled 2013-01-12 (×16): qty 1

## 2013-01-12 MED ORDER — DARBEPOETIN ALFA-POLYSORBATE 60 MCG/0.3ML IJ SOLN
60.0000 ug | INTRAMUSCULAR | Status: DC
  Administered 2013-01-12: 60 ug via INTRAVENOUS
  Filled 2013-01-12: qty 0.3

## 2013-01-12 NOTE — Progress Notes (Signed)
Pt venous fistula site running high pressures and swelling a small amt. Small infiltration. Tx stopped 21 min early and blood rinsed back via the arterial needle. Dr. Briant Cedar called and informed of this. Ice put to site.

## 2013-01-12 NOTE — ED Provider Notes (Signed)
LACERATION REPAIR Performed by: Antony Madura Authorized by: Antony Madura Consent: Verbal consent obtained. Risks and benefits: risks, benefits and alternatives were discussed Consent given by: patient Patient identity confirmed: provided demographic data Prepped and Draped in normal sterile fashion Wound explored  Laceration Location: L eyebrow  Laceration Length: 1cm  No Foreign Bodies seen or palpated  Anesthesia: local infiltration  Local anesthetic: lidocaine 2% without epinephrine  Anesthetic total: 1.5 ml  Irrigation method: syringe Amount of cleaning: standard  Skin closure: 5-0 prolene  Number of sutures: 2  Technique: simple interrupted  Patient tolerance: Patient tolerated the procedure well with no immediate complications.   Antony Madura, PA-C 01/12/13 1557

## 2013-01-12 NOTE — ED Notes (Addendum)
Pt arrived by [facility]. Pt found in bathtub, unresponsive and snoring respiration. gcs of 4 on ems arrival. Pt more awake and alert on arrival to ed, remains confused. Has laceration on head.

## 2013-01-12 NOTE — Procedures (Signed)
Pt seen on HD.  BFR 150.  AP 10 VP 160   On 3.5 Ca bath

## 2013-01-12 NOTE — Progress Notes (Signed)
Chaplain responded to ED trauma page. Provided encouraging words to pt in trauma bay. After pt's wife arrived provided emotional and spiritual support for her while pt was in CT.

## 2013-01-12 NOTE — Consult Note (Signed)
[name] is an 79 y.o. male referred by Dr Jerral Ralph   Chief Complaint: Uremia, hypocalcemia HPI: 80yo BM with CKD5 was found down at home by wife after she heard a thud sound.  Pt does not remember the incident and was confused afterwards.  He has been close to needing to start HD and last labs before today showed bun/cr of 123/9.4.  Today bun 146 and Ca 6.3.  He sustained a lac over Lt eye and tongue  Past Medical History  Diagnosis Date  . Hypertension   . Diabetes mellitus without complication   . Arthritis   . Renal disorder     chronic renal disease  . History of blood transfusion   . GI bleed   . History of blood transfusion     Past Surgical History  Procedure Laterality Date  . Esophagogastroduodenoscopy  10/03/2012    Procedure: ESOPHAGOGASTRODUODENOSCOPY (EGD);  Surgeon: Theda Belfast, MD;  Location: [facility] ENDOSCOPY;  Service: Endoscopy;  Laterality: N/A;  . Colonoscopy  10/04/2012    Procedure: COLONOSCOPY;  Surgeon: Theda Belfast, MD;  Location: [facility] ENDOSCOPY;  Service: Endoscopy;  Laterality: N/A;  . Colonoscopy with esophagogastroduodenoscopy (egd) Left 11/30/2012    Procedure: COLONOSCOPY WITH ESOPHAGOGASTRODUODENOSCOPY (EGD);  Surgeon: Theda Belfast, MD;  Location: [facility] ENDOSCOPY;  Service: Endoscopy;  Laterality: Left;    Family History  Problem Relation Age of Onset  . Sudden death Mother   . Sudden death Father    Social History:  reports that he has never smoked. He does not have any smokeless tobacco history on file. He reports that he does not drink alcohol or use illicit drugs.  Allergies: No Known Allergies   (Not in a hospital admission)   Lab Results: UA: ND   Recent Labs  01/12/13 1230  WBC 7.7  HGB 10.7*  HCT 31.9*  PLT 134*   BMET  Recent Labs  01/12/13 1230  NA 139  K 5.0  CL 103  CO2 16*  GLUCOSE 173*  BUN 146*  CREATININE 10.77*  CALCIUM 6.3*   LFT No results found for this basename: PROT, ALBUMIN, AST, ALT, ALKPHOS,  BILITOT, BILIDIR, IBILI,  in the last 72 hours Ct Head Wo Contrast  01/12/2013  *RADIOLOGY REPORT*  Clinical Data: Fall, head injury The  CT HEAD WITHOUT CONTRAST,CT CERVICAL SPINE WITHOUT CONTRAST  Technique:  Contiguous axial images were obtained from the base of the skull through the vertex without contrast.,Technique: Multidetector CT imaging of the cervical spine was performed. Multiplanar CT image reconstructions were also generated.  Comparison: None.  Findings: No skull fracture is noted. The mastoid air cells are unremarkable.  No intracranial hemorrhage, mass effect or midline shift. Mild mucosal thickening bilateral posterior aspect of the maxillary sinus.  No acute infarction. No mass lesion is noted on this unenhanced scan.  Bilateral basal ganglia punctate calcifications are noted.  Mild cerebral atrophy.  Periventricular and patchy subcortical white matter decreased attenuation probable due to chronic small vessel ischemic changes.  IMPRESSION: No acute intracranial abnormality.  Mild cerebral atrophy. Periventricular and subcortical white matter decreased attenuation probable due to chronic small vessel ischemic changes.  CT cervical spine without IV contrast:  Axial images of the cervical spine shows no acute fracture or subluxation.  Computer processed images shows mild degenerative changes C1-C2 articulation.  Minimal disc space flattening with mild posterior spurring at C2-C3 level.  There is mild disc space flattening with mild anterior and mild posterior spurring at C3-C4 level.  Moderate disc space flattening with mild anterior and mild posterior spurring at C4-C5 and C6-C7 level.  Mild disc space flattening with mild anterior spurring at C5-C6 level.  No prevertebral soft tissue swelling.  Cervical airway is patent.  There is no pneumothorax in visualized lung apices.  Impression:  1.  No acute fracture or subluxation.  Degenerative changes as described above.   Original Report Authenticated  By: Natasha Mead, M.D.    Ct Cervical Spine Wo Contrast  01/12/2013  *RADIOLOGY REPORT*  Clinical Data: Fall, head injury The  CT HEAD WITHOUT CONTRAST,CT CERVICAL SPINE WITHOUT CONTRAST  Technique:  Contiguous axial images were obtained from the base of the skull through the vertex without contrast.,Technique: Multidetector CT imaging of the cervical spine was performed. Multiplanar CT image reconstructions were also generated.  Comparison: None.  Findings: No skull fracture is noted. The mastoid air cells are unremarkable.  No intracranial hemorrhage, mass effect or midline shift. Mild mucosal thickening bilateral posterior aspect of the maxillary sinus.  No acute infarction. No mass lesion is noted on this unenhanced scan.  Bilateral basal ganglia punctate calcifications are noted.  Mild cerebral atrophy.  Periventricular and patchy subcortical white matter decreased attenuation probable due to chronic small vessel ischemic changes.  IMPRESSION: No acute intracranial abnormality.  Mild cerebral atrophy. Periventricular and subcortical white matter decreased attenuation probable due to chronic small vessel ischemic changes.  CT cervical spine without IV contrast:  Axial images of the cervical spine shows no acute fracture or subluxation.  Computer processed images shows mild degenerative changes C1-C2 articulation.  Minimal disc space flattening with mild posterior spurring at C2-C3 level.  There is mild disc space flattening with mild anterior and mild posterior spurring at C3-C4 level.  Moderate disc space flattening with mild anterior and mild posterior spurring at C4-C5 and C6-C7 level.  Mild disc space flattening with mild anterior spurring at C5-C6 level.  No prevertebral soft tissue swelling.  Cervical airway is patent.  There is no pneumothorax in visualized lung apices.  Impression:  1.  No acute fracture or subluxation.  Degenerative changes as described above.   Original Report Authenticated By: Natasha Mead, M.D.    Dg Chest Port 1 View  01/12/2013  *RADIOLOGY REPORT*  Clinical Data: Unresponsive, recent injury  PORTABLE CHEST - 1 VIEW  Comparison: 04/16/2010  Findings: Cardiac shadow is mildly enlarged.  Mild vascular congestion is seen.  No focal confluent infiltrate is noted.  No bony abnormality is noted.  IMPRESSION: Mild Vascular congestion.   Original Report Authenticated By: Alcide Clever, M.D.     ROS: appetite has been good No sob No CP No change in bowels Chronic Jt pain from gout No rash Rest ROS neg   PHYSICAL EXAM: Blood pressure 155/96, pulse 88, temperature 97.6 F (36.4 C), resp. rate 24, height 6\' 2"  (1.88 m), SpO2 99.00%. HEENT: PERRLA EOMI  Rt subconjunctival hem.  Sutured Lac over Lt lat eyebrow.  Lac on Rt side of tongue NECK:No JVD No nodes LUNGS:clear CARDIAC:RRR woMRG ABD:+BS NTND No hsm EXT:No edema  RUA AVF + bruit.  Enlarged Olecranon bursa bil Rt>lt  Arthritis changes both hands R>L NEURO:CNI  M&SI Ox3 No asterixis  Assessment: 1. Uremia 2. Hypocalcemia 3. Probable Seizure due to lowered threshold from uremia and hypocalcemia 4. Anemia 5. Gout PLAN: 1. HD tonight 2. Clip for [facility] 3. Start renavite 4. Check PO4 5. Use high Ca bath and will start Ca based PO4 binder 6. Check PTH  7. Start aranesp   Vilas Edgerly T 01/12/2013, 4:13 PM

## 2013-01-12 NOTE — ED Notes (Signed)
Patient transported to CT 

## 2013-01-12 NOTE — ED Notes (Signed)
Pt has attempted to urinate x 2, unable to obtain urine sample at this time.

## 2013-01-12 NOTE — ED Notes (Signed)
admittign md at bedside to eval pt

## 2013-01-12 NOTE — ED Notes (Signed)
Pt taken off LSB by Bernette Mayers, MD; pt placed on monitor, continuous pulse oximetry and blood pressure cuff

## 2013-01-12 NOTE — H&P (Addendum)
PATIENT DETAILS Name: [name] Age: 79 y.o. Sex: male Date of Birth: July 29, 1932 Admit Date: 01/12/2013 WUJ:[id number],OZHYQM A, MD   CHIEF COMPLAINT:  Syncope  HPI: [name] is a 79 y.o. male with a Past Medical History ofstage V chronic kidney disease, diabetes, hypertension, recent history of GI bleeding from arteriovenous malformations who presents today with the above noted complaint.most of the history is obtained from the patient's spouse and also from the patient, apparently early this morning, while patient was in the bathroom he sustained a syncopal event. His wife was downstairs, she heard him fall and ran upstairs, she found him on the bathroom floor wedged between the wall and the commode. He was apparently awake but lethargic and somewhat confused. She then called EMS,upon EMS arrival patient started slowly coming around, he was then brought to the emergency room for further evaluation. While in the emergency room patient completely regained his usual mentation.CT of the head and C-spine was found to be negative, patient sustained a small laceration above his left eye. I was then asked to admit this patient for further evaluation and treatment. Per the history obtained, patient has been in his usual state of health over the past few days to weeks. He does have some unsteady gait, but has not been excessively lethargic nor has he had any issues with nausea vomiting.Patient does not have a history of seizures in the past.  ALLERGIES:  No Known Allergies  PAST MEDICAL HISTORY: Past Medical History  Diagnosis Date  . Hypertension   . Diabetes mellitus without complication   . Arthritis   . Renal disorder     chronic renal disease  . History of blood transfusion   . GI bleed   . History of blood transfusion     PAST SURGICAL HISTORY: Past Surgical History  Procedure Laterality Date  . Esophagogastroduodenoscopy  10/03/2012    Procedure: ESOPHAGOGASTRODUODENOSCOPY  (EGD);  Surgeon: Theda Belfast, MD;  Location: [facility] ENDOSCOPY;  Service: Endoscopy;  Laterality: N/A;  . Colonoscopy  10/04/2012    Procedure: COLONOSCOPY;  Surgeon: Theda Belfast, MD;  Location: [facility] ENDOSCOPY;  Service: Endoscopy;  Laterality: N/A;  . Colonoscopy with esophagogastroduodenoscopy (egd) Left 11/30/2012    Procedure: COLONOSCOPY WITH ESOPHAGOGASTRODUODENOSCOPY (EGD);  Surgeon: Theda Belfast, MD;  Location: [facility] ENDOSCOPY;  Service: Endoscopy;  Laterality: Left;    MEDICATIONS AT HOME: Prior to Admission medications   Medication Sig Start Date End Date Taking? Authorizing Provider  allopurinol (ZYLOPRIM) 100 MG tablet Take 100 mg by mouth 2 (two) times a week. Monday and Thursday 10/08/12  Yes [name], MD  amLODipine (NORVASC) 10 MG tablet Take 10 mg by mouth Daily.  08/15/12  Yes Historical Provider, MD  calcium carbonate (TUMS - DOSED IN MG ELEMENTAL CALCIUM) 500 MG chewable tablet Chew 1 tablet (200 mg of elemental calcium total) by mouth 3 (three) times daily with meals. 12/01/12  Yes Elijiah Mickley Levora Dredge, MD  diphenhydramine-acetaminophen (TYLENOL PM) 25-500 MG TABS Take 1 tablet by mouth at bedtime as needed (for pain).   Yes Historical Provider, MD  ferrous sulfate 325 (65 FE) MG tablet Take 325 mg by mouth 2 (two) times daily with a meal.   Yes Historical Provider, MD  pantoprazole (PROTONIX) 20 MG tablet Take 20 mg by mouth 2 (two) times daily. 10/08/12  Yes [name], MD  sodium bicarbonate 650 MG tablet Take 1 tablet (650 mg total) by mouth 2 (two) times daily. 12/01/12  Yes Raynell Scott Levora Dredge,  MD  sodium chloride (OCEAN) 0.65 % nasal spray Place 1 spray into the nose as needed for congestion.   Yes Historical Provider, MD    FAMILY HISTORY: Family History  Problem Relation Age of Onset  . Sudden death Mother   . Sudden death Father     SOCIAL HISTORY:  reports that he has never smoked. He does not have any smokeless tobacco history on file. He reports that he does  not drink alcohol or use illicit drugs.  REVIEW OF SYSTEMS:  Constitutional:   No  weight loss, night sweats,  Fevers, chills, fatigue.  HEENT:    No headaches, Difficulty swallowing,Tooth/dental problems,Sore throat,  No sneezing, itching, ear ache, nasal congestion, post nasal drip,   Cardio-vascular: No chest pain,  Orthopnea, PND, swelling in lower extremities, anasarca, dizziness, palpitations  GI:  No heartburn, indigestion, abdominal pain, nausea, vomiting, diarrhea, change in bowel habits, loss of appetite  Resp: No shortness of breath with exertion or at rest.  No excess mucus, no productive cough, No non-productive cough,  No coughing up of blood.No change in color of mucus.No wheezing.No chest wall deformity  Skin:  no rash or lesions.  GU:  no dysuria, change in color of urine, no urgency or frequency.  No flank pain.  Musculoskeletal: No joint pain or swelling.  No decreased range of motion.  No back pain.  Psych: No change in mood or affect. No depression or anxiety.  No memory loss.   PHYSICAL EXAM: Blood pressure 163/72, pulse 86, temperature 97.6 F (36.4 C), resp. rate 19, height 6\' 2"  (1.88 m), SpO2 100.00%.  General appearance :Awake, alert, not in any distress. Speech Clear. Not toxic Looking. Small laceration above his left eyebrow. HEENT: Atraumatic and Normocephalic, pupils equally reactive to light and accomodation. Right subconjunctival hemorrhage present. Neck: supple, no JVD. No cervical lymphadenopathy.  Chest:Good air entry bilaterally, no added sounds  CVS: S1 S2 regular, no murmurs.  Abdomen: Bowel sounds present, Non tender and not distended with no gaurding, rigidity or rebound. Extremities: B/L Lower Ext shows no edema, both legs are warm to touch.skin graft and chronic skin changes to the right lower leg and also to the right lower leg Neurology: Awake alert, and oriented X 3, CN II-XII intact, Non focal Skin:No Rash Wounds:N/A  LABS  ON ADMISSION:   Recent Labs  01/12/13 1230  NA 139  K 5.0  CL 103  CO2 16*  GLUCOSE 173*  BUN 146*  CREATININE 10.77*  CALCIUM 6.3*   No results found for this basename: AST, ALT, ALKPHOS, BILITOT, PROT, ALBUMIN,  in the last 72 hours No results found for this basename: LIPASE, AMYLASE,  in the last 72 hours  Recent Labs  01/12/13 1230  WBC 7.7  NEUTROABS 6.7  HGB 10.7*  HCT 31.9*  MCV 93.5  PLT 134*   No results found for this basename: CKTOTAL, CKMB, CKMBINDEX, TROPONINI,  in the last 72 hours No results found for this basename: DDIMER,  in the last 72 hours No components found with this basename: POCBNP,    RADIOLOGIC STUDIES ON ADMISSION: Ct Head Wo Contrast  01/12/2013  *RADIOLOGY REPORT*  Clinical Data: Fall, head injury The  CT HEAD WITHOUT CONTRAST,CT CERVICAL SPINE WITHOUT CONTRAST  Technique:  Contiguous axial images were obtained from the base of the skull through the vertex without contrast.,Technique: Multidetector CT imaging of the cervical spine was performed. Multiplanar CT image reconstructions were also generated.  Comparison: None.  Findings: No  skull fracture is noted. The mastoid air cells are unremarkable.  No intracranial hemorrhage, mass effect or midline shift. Mild mucosal thickening bilateral posterior aspect of the maxillary sinus.  No acute infarction. No mass lesion is noted on this unenhanced scan.  Bilateral basal ganglia punctate calcifications are noted.  Mild cerebral atrophy.  Periventricular and patchy subcortical white matter decreased attenuation probable due to chronic small vessel ischemic changes.  IMPRESSION: No acute intracranial abnormality.  Mild cerebral atrophy. Periventricular and subcortical white matter decreased attenuation probable due to chronic small vessel ischemic changes.  CT cervical spine without IV contrast:  Axial images of the cervical spine shows no acute fracture or subluxation.  Computer processed images shows mild  degenerative changes C1-C2 articulation.  Minimal disc space flattening with mild posterior spurring at C2-C3 level.  There is mild disc space flattening with mild anterior and mild posterior spurring at C3-C4 level.  Moderate disc space flattening with mild anterior and mild posterior spurring at C4-C5 and C6-C7 level.  Mild disc space flattening with mild anterior spurring at C5-C6 level.  No prevertebral soft tissue swelling.  Cervical airway is patent.  There is no pneumothorax in visualized lung apices.  Impression:  1.  No acute fracture or subluxation.  Degenerative changes as described above.   Original Report Authenticated By: Natasha Mead, M.D.    Ct Cervical Spine Wo Contrast  01/12/2013  *RADIOLOGY REPORT*  Clinical Data: Fall, head injury The  CT HEAD WITHOUT CONTRAST,CT CERVICAL SPINE WITHOUT CONTRAST  Technique:  Contiguous axial images were obtained from the base of the skull through the vertex without contrast.,Technique: Multidetector CT imaging of the cervical spine was performed. Multiplanar CT image reconstructions were also generated.  Comparison: None.  Findings: No skull fracture is noted. The mastoid air cells are unremarkable.  No intracranial hemorrhage, mass effect or midline shift. Mild mucosal thickening bilateral posterior aspect of the maxillary sinus.  No acute infarction. No mass lesion is noted on this unenhanced scan.  Bilateral basal ganglia punctate calcifications are noted.  Mild cerebral atrophy.  Periventricular and patchy subcortical white matter decreased attenuation probable due to chronic small vessel ischemic changes.  IMPRESSION: No acute intracranial abnormality.  Mild cerebral atrophy. Periventricular and subcortical white matter decreased attenuation probable due to chronic small vessel ischemic changes.  CT cervical spine without IV contrast:  Axial images of the cervical spine shows no acute fracture or subluxation.  Computer processed images shows mild degenerative  changes C1-C2 articulation.  Minimal disc space flattening with mild posterior spurring at C2-C3 level.  There is mild disc space flattening with mild anterior and mild posterior spurring at C3-C4 level.  Moderate disc space flattening with mild anterior and mild posterior spurring at C4-C5 and C6-C7 level.  Mild disc space flattening with mild anterior spurring at C5-C6 level.  No prevertebral soft tissue swelling.  Cervical airway is patent.  There is no pneumothorax in visualized lung apices.  Impression:  1.  No acute fracture or subluxation.  Degenerative changes as described above.   Original Report Authenticated By: Natasha Mead, M.D.    Dg Chest Port 1 View  01/12/2013  *RADIOLOGY REPORT*  Clinical Data: Unresponsive, recent injury  PORTABLE CHEST - 1 VIEW  Comparison: 04/16/2010  Findings: Cardiac shadow is mildly enlarged.  Mild vascular congestion is seen.  No focal confluent infiltrate is noted.  No bony abnormality is noted.  IMPRESSION: Mild Vascular congestion.   Original Report Authenticated By: Alcide Clever, M.D.  ASSESSMENT AND PLAN: Present on Admission:  .  Syncope -not sure whether this is neurocardiogenic or secondary to seizures. On examination he does have a tongue bite on his right lateral tongue area. - Admit to telemetry, will get a EEG and MRI of the brain.Although patient has some metabolic acidosis and stage 5 chronic kidney disease, he does not appear uremic-he has been in his usual state of health prior to this event.I am not fully convinced that his metabolic derangements have caused him to have a potential seizure.In any event, he will be admitted for further close monitoring and further workup.If this was in fact a seizure, this is his first episode-I will not start him on antiepileptic therapy yet.Nephrology will assess him as well, to see if we need to go ahead and start Hemodialysis.  If he were to sustain this syncopal episode/seizure-would need to consult with  neurology. -check an echo as well.  . Anemia in chronic kidney disease -hemoglobin appears much better than his last admission. Patient has a history of chronic GI bleed from arteriovenous malformations. - Monitor H&H periodically while inpatient. - Patient's primary gastroenterologist is Dr. Rexene Edison notify him if any new becomes an issue or if patient develops GI bleeding  . Diabetes type 2, controlled -Currently diet controlled, not on any oral hypoglycemic agents. - Is on SSI [place] inpatient. Monitor CBGs closely.  . Hypertension -Continue with amlodipine. - Monitoring blood pressure closely.  . Metabolic acidosis, increased anion gap -Secondary to chronic kidney disease. - Continue with sodium bicarbonate  . Progressive CKD (chronic kidney disease) stage 5, GFR less than 15 ml/min -no urgent indications for dialysis. - As noted above, not sure whether his current syncope or seizure he is related to his underlying metabolic derangements and kidney function. I have asked nephrology to consult-Dr. Briant Cedar will see the patient to see if infact needs to start HD given a questionable seizure episode.  Further plan will depend as patient's clinical course evolves and further radiologic and laboratory data become available. Patient will be monitored closely.   DVT Prophylaxis: Prophylactic Heparin  Code Status: Full Code  Total time spent for admission equals 45 minutes.  [facility] Pager [phone]  If 7PM-7AM, please contact night-coverage [email or web address] Password [email or web address] 01/12/2013, 3:15 PM

## 2013-01-12 NOTE — ED Provider Notes (Signed)
Medical screening examination/treatment/procedure(s) were conducted as a shared visit with non-physician practitioner(s) and myself.  I personally evaluated the patient during the encounter. I was available at bedside for procedure.    Charles B. Bernette Mayers, MD 01/12/13 1600

## 2013-01-12 NOTE — ED Notes (Signed)
Patient returned from CT

## 2013-01-12 NOTE — ED Provider Notes (Signed)
History     CSN: [id number]  Arrival date & time 01/12/13  1010   First MD Initiated Contact with Patient 01/12/13 1015      Chief Complaint  Patient presents with  . Fall    (Consider location/radiation/quality/duration/timing/severity/associated sxs/prior treatment) Patient is a 79 y.o. male presenting with fall.  Fall   Level 5 caveat due to confusion Pt brought to the ED via EMS from home after wife reports she heard him fall in the bathroom this AM. She found him unresponsive on the floor. EMS reports initially responsive to pain only but has since begun to wake up some. He does not know what happened. No prior history of seizures. He has history of CKD and associated anemia gets periodic transfusions.   Past Medical History  Diagnosis Date  . Hypertension   . Diabetes mellitus without complication   . Arthritis   . Renal disorder     chronic renal disease  . History of blood transfusion   . GI bleed   . History of blood transfusion     Past Surgical History  Procedure Laterality Date  . Esophagogastroduodenoscopy  10/03/2012    Procedure: ESOPHAGOGASTRODUODENOSCOPY (EGD);  Surgeon: Theda Belfast, MD;  Location: [facility] ENDOSCOPY;  Service: Endoscopy;  Laterality: N/A;  . Colonoscopy  10/04/2012    Procedure: COLONOSCOPY;  Surgeon: Theda Belfast, MD;  Location: [facility] ENDOSCOPY;  Service: Endoscopy;  Laterality: N/A;  . Colonoscopy with esophagogastroduodenoscopy (egd) Left 11/30/2012    Procedure: COLONOSCOPY WITH ESOPHAGOGASTRODUODENOSCOPY (EGD);  Surgeon: Theda Belfast, MD;  Location: [facility] ENDOSCOPY;  Service: Endoscopy;  Laterality: Left;    Family History  Problem Relation Age of Onset  . Sudden death Mother   . Sudden death Father     History  Substance Use Topics  . Smoking status: Never Smoker   . Smokeless tobacco: Not on file  . Alcohol Use: No      Review of Systems All other systems reviewed and are negative except as noted in HPI.   Allergies   Review of patient's allergies indicates no known allergies.  Home Medications   Current Outpatient Rx  Name  Route  Sig  Dispense  Refill  . allopurinol (ZYLOPRIM) 100 MG tablet   Oral   Take 100 mg by mouth 2 (two) times a week. Monday and Thursday         . amLODipine (NORVASC) 10 MG tablet   Oral   Take 10 mg by mouth Daily.          . calcium carbonate (TUMS - DOSED IN MG ELEMENTAL CALCIUM) 500 MG chewable tablet   Oral   Chew 1 tablet (200 mg of elemental calcium total) by mouth 3 (three) times daily with meals.   90 tablet   0   . diphenhydramine-acetaminophen (TYLENOL PM) 25-500 MG TABS   Oral   Take 1 tablet by mouth at bedtime as needed (for pain).         . ferrous sulfate 325 (65 FE) MG tablet   Oral   Take 325 mg by mouth 2 (two) times daily with a meal.         . pantoprazole (PROTONIX) 20 MG tablet   Oral   Take 20 mg by mouth 2 (two) times daily.         . sodium bicarbonate 650 MG tablet   Oral   Take 1 tablet (650 mg total) by mouth 2 (two) times daily.  60 tablet   0   . sodium chloride (OCEAN) 0.65 % nasal spray   Nasal   Place 1 spray into the nose as needed for congestion.           BP 157/71  Pulse 85  Resp 16  SpO2 100%  Physical Exam  Nursing note and vitals reviewed. Constitutional: He appears well-developed and well-nourished.  HENT:  Head: Normocephalic.  Laceration L eyebrow; bruising to R tongue  Eyes: EOM are normal. Pupils are equal, round, and reactive to light.  Neck:  In collar  Cardiovascular: Normal rate, normal heart sounds and intact distal pulses.   Pulmonary/Chest: Effort normal and breath sounds normal.  Abdominal: Bowel sounds are normal. He exhibits no distension. There is no tenderness.  Musculoskeletal: Normal range of motion. He exhibits no edema and no tenderness (no spine tenderness).  Neurological: He is alert. He has normal strength. No cranial nerve deficit or sensory deficit.  Skin: Skin  is warm and dry. No rash noted.  Psychiatric:  confused    ED Course  Procedures (including critical care time)  Labs Reviewed  CBC WITH DIFFERENTIAL  BASIC METABOLIC PANEL  PROTIME-INR  APTT  URINALYSIS, ROUTINE W REFLEX MICROSCOPIC  TYPE AND SCREEN   Ct Head Wo Contrast  01/12/2013  *RADIOLOGY REPORT*  Clinical Data: Fall, head injury The  CT HEAD WITHOUT CONTRAST,CT CERVICAL SPINE WITHOUT CONTRAST  Technique:  Contiguous axial images were obtained from the base of the skull through the vertex without contrast.,Technique: Multidetector CT imaging of the cervical spine was performed. Multiplanar CT image reconstructions were also generated.  Comparison: None.  Findings: No skull fracture is noted. The mastoid air cells are unremarkable.  No intracranial hemorrhage, mass effect or midline shift. Mild mucosal thickening bilateral posterior aspect of the maxillary sinus.  No acute infarction. No mass lesion is noted on this unenhanced scan.  Bilateral basal ganglia punctate calcifications are noted.  Mild cerebral atrophy.  Periventricular and patchy subcortical white matter decreased attenuation probable due to chronic small vessel ischemic changes.  IMPRESSION: No acute intracranial abnormality.  Mild cerebral atrophy. Periventricular and subcortical white matter decreased attenuation probable due to chronic small vessel ischemic changes.  CT cervical spine without IV contrast:  Axial images of the cervical spine shows no acute fracture or subluxation.  Computer processed images shows mild degenerative changes C1-C2 articulation.  Minimal disc space flattening with mild posterior spurring at C2-C3 level.  There is mild disc space flattening with mild anterior and mild posterior spurring at C3-C4 level.  Moderate disc space flattening with mild anterior and mild posterior spurring at C4-C5 and C6-C7 level.  Mild disc space flattening with mild anterior spurring at C5-C6 level.  No prevertebral soft  tissue swelling.  Cervical airway is patent.  There is no pneumothorax in visualized lung apices.  Impression:  1.  No acute fracture or subluxation.  Degenerative changes as described above.   Original Report Authenticated By: Natasha Mead, M.D.    Ct Cervical Spine Wo Contrast  01/12/2013  *RADIOLOGY REPORT*  Clinical Data: Fall, head injury The  CT HEAD WITHOUT CONTRAST,CT CERVICAL SPINE WITHOUT CONTRAST  Technique:  Contiguous axial images were obtained from the base of the skull through the vertex without contrast.,Technique: Multidetector CT imaging of the cervical spine was performed. Multiplanar CT image reconstructions were also generated.  Comparison: None.  Findings: No skull fracture is noted. The mastoid air cells are unremarkable.  No intracranial hemorrhage, mass effect or midline  shift. Mild mucosal thickening bilateral posterior aspect of the maxillary sinus.  No acute infarction. No mass lesion is noted on this unenhanced scan.  Bilateral basal ganglia punctate calcifications are noted.  Mild cerebral atrophy.  Periventricular and patchy subcortical white matter decreased attenuation probable due to chronic small vessel ischemic changes.  IMPRESSION: No acute intracranial abnormality.  Mild cerebral atrophy. Periventricular and subcortical white matter decreased attenuation probable due to chronic small vessel ischemic changes.  CT cervical spine without IV contrast:  Axial images of the cervical spine shows no acute fracture or subluxation.  Computer processed images shows mild degenerative changes C1-C2 articulation.  Minimal disc space flattening with mild posterior spurring at C2-C3 level.  There is mild disc space flattening with mild anterior and mild posterior spurring at C3-C4 level.  Moderate disc space flattening with mild anterior and mild posterior spurring at C4-C5 and C6-C7 level.  Mild disc space flattening with mild anterior spurring at C5-C6 level.  No prevertebral soft tissue  swelling.  Cervical airway is patent.  There is no pneumothorax in visualized lung apices.  Impression:  1.  No acute fracture or subluxation.  Degenerative changes as described above.   Original Report Authenticated By: Natasha Mead, M.D.    Dg Chest Port 1 View  01/12/2013  *RADIOLOGY REPORT*  Clinical Data: Unresponsive, recent injury  PORTABLE CHEST - 1 VIEW  Comparison: 04/16/2010  Findings: Cardiac shadow is mildly enlarged.  Mild vascular congestion is seen.  No focal confluent infiltrate is noted.  No bony abnormality is noted.  IMPRESSION: Mild Vascular congestion.   Original Report Authenticated By: Alcide Clever, M.D.      1. Fall, initial encounter   2. Head injury, initial encounter   3. Face lacerations, initial encounter   4. Tongue injury, initial encounter   5. Chronic kidney disease   6. Syncope   7. CKD (chronic kidney disease) stage 5, GFR less than 15 ml/min   8. Diabetes type 2, controlled   9. Hypertension       MDM   Date: 01/12/2013  Rate: 87  Rhythm: normal sinus rhythm  QRS Axis: normal  Intervals: QT prolonged  ST/T Wave abnormalities: nonspecific T wave changes  Conduction Disutrbances:first-degree A-V block   Narrative Interpretation:   Old EKG Reviewed: changes noted, t-wave inversions are new   3:57 PM Pt now back to baseline, has passed swallow screen and eating dinner. Will admit for further evaluation of syncope or seizure. Laceration repaired by Antony Madura, PA. [facility] evaluating now too to begin dialysis.      Wilna Pennie B. Bernette Mayers, MD 01/12/13 0981

## 2013-01-13 DIAGNOSIS — D631 Anemia in chronic kidney disease: Secondary | ICD-10-CM

## 2013-01-13 DIAGNOSIS — N189 Chronic kidney disease, unspecified: Secondary | ICD-10-CM

## 2013-01-13 LAB — COMPREHENSIVE METABOLIC PANEL
Albumin: 3.7 g/dL (ref 3.5–5.2)
BUN: 110 mg/dL — ABNORMAL HIGH (ref 6–23)
Creatinine, Ser: 9.28 mg/dL — ABNORMAL HIGH (ref 0.50–1.35)
GFR calc Af Amer: 5 mL/min — ABNORMAL LOW (ref >90)
Glucose, Bld: 154 mg/dL — ABNORMAL HIGH (ref 70–99)
Total Bilirubin: 0.4 mg/dL (ref 0.3–1.2)
Total Protein: 6.8 g/dL (ref 6.0–8.3)

## 2013-01-13 LAB — CBC
HCT: 29.5 % — ABNORMAL LOW (ref 39.0–52.0)
MCHC: 33.9 g/dL (ref 30.0–36.0)
MCV: 93.1 fL (ref 78.0–100.0)
RDW: 17.5 % — ABNORMAL HIGH (ref 11.5–15.5)

## 2013-01-13 LAB — GLUCOSE, CAPILLARY
Glucose-Capillary: 103 mg/dL — ABNORMAL HIGH (ref 70–99)
Glucose-Capillary: 193 mg/dL — ABNORMAL HIGH (ref 70–99)
Glucose-Capillary: 219 mg/dL — ABNORMAL HIGH (ref 70–99)

## 2013-01-13 LAB — HEPATITIS B SURFACE ANTIBODY,QUALITATIVE: Hep B S Ab: NONREACTIVE

## 2013-01-13 LAB — TROPONIN I: Troponin I: <0.3 ng/mL (ref <0.30)

## 2013-01-13 NOTE — Evaluation (Signed)
Physical Therapy Evaluation Patient Details Name: [name] MRN: [id number] DOB: 02/06/1932 Today's Date: 01/13/2013 Time: 4098-1191 PT Time Calculation (min): 28 min  PT Assessment / Plan / Recommendation Clinical Impression  79 yo male admitted after a fall and decreased responsiveness at home likely due to seizure.  Pt reports he is doing much better today, but he still has a gait disturbance.  Pt started his first HD last night.  Hopeful that pt witll continue recovery and be able to d/c to home with HHPT , but he may benefit from short CIR stay if progress is slow    PT Assessment  Patient needs continued PT services    Follow Up Recommendations  Home health PT    Does the patient have the potential to tolerate intense rehabilitation      Barriers to Discharge        Equipment Recommendations  Rolling walker with 5" wheels    Recommendations for Other Services OT consult   Frequency Min 3X/week    Precautions / Restrictions     Pertinent Vitals/Pain Pt with no c/o pain      Mobility  Bed Mobility Bed Mobility: Supine to Sit Supine to Sit: 4: Min assist Transfers Transfers: Sit to Stand;Stand to Sit Sit to Stand: 4: Min assist Stand to Sit: 4: Min assist Details for Transfer Assistance: Pt impulsive .  Need assist to use RW for assist Ambulation/Gait Ambulation/Gait Assistance: 3: Mod assist Ambulation Distance (Feet): 15 Feet Assistive device: Rolling walker Ambulation/Gait Assistance Details: pt needed much assist for safety with RW.  Gait Pattern: Ataxic;Trunk flexed;Wide base of support Gait velocity: impulsive General Gait Details: Pt needed to use the bathroom, so gait was impulsive and uncontrolled.  Also uncontrolled on return to sink to wash hands and back to chair.  Pt was unable to stand erect at sink to wash hand after cleaning himself after BM.  He leaned on elbows and washed hands brusquely, ultimately dislodging IV in left hand.Pt did not want  to walk further at this time as he wanted to eat his lunch Stairs: No Wheelchair Mobility Wheelchair Mobility: No    Exercises     PT Diagnosis: Difficulty walking;Abnormality of gait;Generalized weakness  PT Problem List: Decreased activity tolerance;Decreased balance;Decreased mobility;Decreased coordination;Decreased safety awareness;Decreased knowledge of precautions PT Treatment Interventions: DME instruction;Gait training;Functional mobility training;Therapeutic activities;Therapeutic exercise;Balance training;Patient/family education   PT Goals Acute Rehab PT Goals PT Goal Formulation: With patient/family Time For Goal Achievement: 01/27/13 Potential to Achieve Goals: Good Pt will go Supine/Side to Sit: Independently PT Goal: Supine/Side to Sit - Progress: Goal set today Pt will go Sit to Supine/Side: Independently PT Goal: Sit to Supine/Side - Progress: Goal set today Pt will go Sit to Stand: with modified independence PT Goal: Sit to Stand - Progress: Goal set today Pt will go Stand to Sit: with modified independence PT Goal: Stand to Sit - Progress: Goal set today Pt will Ambulate: 51 - 150 feet;with least restrictive assistive device PT Goal: Ambulate - Progress: Goal set today  Visit Information  Last PT Received On: 01/13/13 Assistance Needed: +1    Subjective Data  Subjective: Oh , I've been up walking already Patient Stated Goal: to get back to the way he was   Prior Functioning  Home Living Lives With: Spouse Available Help at Discharge: Family Type of Home: House Home Layout: Two level Alternate Level Stairs-Number of Steps: stair lift to second story Home Adaptive Equipment: Straight cane Prior Function Level  of Independence: Independent Communication Communication: No difficulties    Cognition  Cognition Arousal/Alertness: Awake/alert Behavior During Therapy: WFL for tasks assessed/performed Overall Cognitive Status: Within Functional Limits for  tasks assessed    Extremity/Trunk Assessment Right Lower Extremity Assessment RLE ROM/Strength/Tone: WFL for tasks assessed Left Lower Extremity Assessment LLE ROM/Strength/Tone: WFL for tasks assessed   Balance Balance Balance Assessed: Yes Static Sitting Balance Static Sitting - Balance Support: No upper extremity supported Static Sitting - Level of Assistance: 5: Stand by assistance Static Standing Balance Static Standing - Balance Support: Bilateral upper extremity supported Static Standing - Level of Assistance: 4: Min assist  End of Session PT - End of Session Activity Tolerance: Patient tolerated treatment well Patient left: in chair;with family/visitor present Nurse Communication: Mobility status  GP    Bayard Hugger. Manson Passey, PT [phone] 01/13/2013, 1:25 PM

## 2013-01-13 NOTE — Progress Notes (Signed)
Physical Therapy Treatment Patient Details Name: [name] MRN: [id number] DOB: 02-Jul-1932 Today's Date: 01/13/2013 Time: 1340-1350 PT Time Calculation (min): 10 min  PT Assessment / Plan / Recommendation Comments on Treatment Session  Better session of ambulation this pm, but pt still needs follow up PT.  Anticipate he will be OK with HHPT    Follow Up Recommendations  Home health PT     Does the patient have the potential to tolerate intense rehabilitation     Barriers to Discharge        Equipment Recommendations  Rolling walker with 5" wheels (pt will try to locate his RW at home)    Recommendations for Other Services OT consult  Frequency Min 3X/week   Plan Discharge plan remains appropriate;Frequency remains appropriate    Precautions / Restrictions     Pertinent Vitals/Pain Pt with no c/o pain    Mobility  Bed Mobility Bed Mobility: Supine to Sit Supine to Sit: 4: Min assist Transfers Transfers: Sit to Stand;Stand to Sit Sit to Stand: 4: Min assist Stand to Sit: 4: Min assist Details for Transfer Assistance: Pt impulsive .  Need assist to use RW for assist Ambulation/Gait Ambulation/Gait Assistance: 4: Min assist Ambulation Distance (Feet): 150 Feet Assistive device: Rolling walker Ambulation/Gait Assistance Details: occasional min assist to control RW Gait Pattern: Trunk flexed;Step-through pattern;Wide base of support Gait velocity: WFL General Gait Details: Pt has flexed posture that he says he has had since his back surgery.  He has some trouble controlling RW, and continues to tend to walk a little fast, but is able to control speed to command Stairs: No Wheelchair Mobility Wheelchair Mobility: No    Exercises     PT Diagnosis: Difficulty walking;Abnormality of gait;Generalized weakness  PT Problem List: Decreased activity tolerance;Decreased balance;Decreased mobility;Decreased coordination;Decreased safety awareness;Decreased knowledge of  precautions PT Treatment Interventions: DME instruction;Gait training;Functional mobility training;Therapeutic activities;Therapeutic exercise;Balance training;Patient/family education   PT Goals Acute Rehab PT Goals PT Goal Formulation: With patient/family Time For Goal Achievement: 01/27/13 Potential to Achieve Goals: Good Pt will go Supine/Side to Sit: Independently PT Goal: Supine/Side to Sit - Progress: Goal set today Pt will go Sit to Supine/Side: Independently PT Goal: Sit to Supine/Side - Progress: Progressing toward goal Pt will go Sit to Stand: with modified independence PT Goal: Sit to Stand - Progress: Progressing toward goal Pt will go Stand to Sit: with modified independence PT Goal: Stand to Sit - Progress: Progressing toward goal Pt will Ambulate: 51 - 150 feet;with least restrictive assistive device PT Goal: Ambulate - Progress: Progressing toward goal  Visit Information  Last PT Received On: 01/13/13 Assistance Needed: +1    Subjective Data  Subjective: I may have a RW from when I had my back surgery Patient Stated Goal: to get stronger   Cognition  Cognition Arousal/Alertness: Awake/alert Behavior During Therapy: WFL for tasks assessed/performed Overall Cognitive Status: Within Functional Limits for tasks assessed    Balance  Balance Balance Assessed: Yes Static Sitting Balance Static Sitting - Balance Support: No upper extremity supported Static Sitting - Level of Assistance: 5: Stand by assistance Static Standing Balance Static Standing - Balance Support: Bilateral upper extremity supported Static Standing - Level of Assistance: 4: Min assist  End of Session PT - End of Session Activity Tolerance: Patient tolerated treatment well Patient left: in chair Nurse Communication: Mobility status   GP     Donnetta Hail 01/13/2013, 1:57 PM

## 2013-01-13 NOTE — Progress Notes (Signed)
Hemodialysis- Pt cannulated x3. Venous needle infiltrated after 10 minutes on HD, HD stopped and ice applied. Dr. Arlean Hopping notified. Order to make pt NPO after midnight for possible cath placement in AM. This is second attempt at HD today using AVF.

## 2013-01-13 NOTE — Progress Notes (Signed)
S: Feels much better.  No further seizures O:BP 176/78  Pulse 91  Temp(Src) 98.8 F (37.1 C) (Oral)  Resp 18  Ht 6' (1.829 m)  Wt 93 kg (205 lb 0.4 oz)  BMI 27.8 kg/m2  SpO2 100%  Intake/Output Summary (Last 24 hours) at 01/13/13 0826 Last data filed at 01/13/13 0149  Gross per 24 hour  Intake      0 ml  Output   1785 ml  Net  -1785 ml   Weight change:  HQI:ONGEX and alert CVS:RRR Resp:clear Abd:+ BS NTND Ext:no edema.Marland Kitchen + infiltration RUA AVF NEURO:CNI OX3 no asterixis    . [START ON 01/15/2013] allopurinol  100 mg Oral Custom  . amLODipine  10 mg Oral Daily  . calcium carbonate  2 tablet Oral TID WC  . calcium carbonate  1 tablet Oral TID WC  . darbepoetin (ARANESP) injection - DIALYSIS  60 mcg Intravenous Q Fri-HD  . ferrous sulfate  325 mg Oral BID WC  . heparin  5,000 Units Subcutaneous Q8H  . insulin aspart  0-9 Units Subcutaneous TID WC  . multivitamin  1 tablet Oral Q supper  . pantoprazole  40 mg Oral BID AC  . sodium bicarbonate  650 mg Oral BID  . sodium chloride  3 mL Intravenous Q12H  . sodium chloride  3 mL Intravenous Q12H   Ct Head Wo Contrast  01/12/2013  *RADIOLOGY REPORT*  Clinical Data: Fall, head injury The  CT HEAD WITHOUT CONTRAST,CT CERVICAL SPINE WITHOUT CONTRAST  Technique:  Contiguous axial images were obtained from the base of the skull through the vertex without contrast.,Technique: Multidetector CT imaging of the cervical spine was performed. Multiplanar CT image reconstructions were also generated.  Comparison: None.  Findings: No skull fracture is noted. The mastoid air cells are unremarkable.  No intracranial hemorrhage, mass effect or midline shift. Mild mucosal thickening bilateral posterior aspect of the maxillary sinus.  No acute infarction. No mass lesion is noted on this unenhanced scan.  Bilateral basal ganglia punctate calcifications are noted.  Mild cerebral atrophy.  Periventricular and patchy subcortical white matter decreased  attenuation probable due to chronic small vessel ischemic changes.  IMPRESSION: No acute intracranial abnormality.  Mild cerebral atrophy. Periventricular and subcortical white matter decreased attenuation probable due to chronic small vessel ischemic changes.  CT cervical spine without IV contrast:  Axial images of the cervical spine shows no acute fracture or subluxation.  Computer processed images shows mild degenerative changes C1-C2 articulation.  Minimal disc space flattening with mild posterior spurring at C2-C3 level.  There is mild disc space flattening with mild anterior and mild posterior spurring at C3-C4 level.  Moderate disc space flattening with mild anterior and mild posterior spurring at C4-C5 and C6-C7 level.  Mild disc space flattening with mild anterior spurring at C5-C6 level.  No prevertebral soft tissue swelling.  Cervical airway is patent.  There is no pneumothorax in visualized lung apices.  Impression:  1.  No acute fracture or subluxation.  Degenerative changes as described above.   Original Report Authenticated By: Natasha Mead, M.D.    Ct Cervical Spine Wo Contrast  01/12/2013  *RADIOLOGY REPORT*  Clinical Data: Fall, head injury The  CT HEAD WITHOUT CONTRAST,CT CERVICAL SPINE WITHOUT CONTRAST  Technique:  Contiguous axial images were obtained from the base of the skull through the vertex without contrast.,Technique: Multidetector CT imaging of the cervical spine was performed. Multiplanar CT image reconstructions were also generated.  Comparison: None.  Findings:  No skull fracture is noted. The mastoid air cells are unremarkable.  No intracranial hemorrhage, mass effect or midline shift. Mild mucosal thickening bilateral posterior aspect of the maxillary sinus.  No acute infarction. No mass lesion is noted on this unenhanced scan.  Bilateral basal ganglia punctate calcifications are noted.  Mild cerebral atrophy.  Periventricular and patchy subcortical white matter decreased attenuation  probable due to chronic small vessel ischemic changes.  IMPRESSION: No acute intracranial abnormality.  Mild cerebral atrophy. Periventricular and subcortical white matter decreased attenuation probable due to chronic small vessel ischemic changes.  CT cervical spine without IV contrast:  Axial images of the cervical spine shows no acute fracture or subluxation.  Computer processed images shows mild degenerative changes C1-C2 articulation.  Minimal disc space flattening with mild posterior spurring at C2-C3 level.  There is mild disc space flattening with mild anterior and mild posterior spurring at C3-C4 level.  Moderate disc space flattening with mild anterior and mild posterior spurring at C4-C5 and C6-C7 level.  Mild disc space flattening with mild anterior spurring at C5-C6 level.  No prevertebral soft tissue swelling.  Cervical airway is patent.  There is no pneumothorax in visualized lung apices.  Impression:  1.  No acute fracture or subluxation.  Degenerative changes as described above.   Original Report Authenticated By: Natasha Mead, M.D.    Dg Chest Port 1 View  01/12/2013  *RADIOLOGY REPORT*  Clinical Data: Unresponsive, recent injury  PORTABLE CHEST - 1 VIEW  Comparison: 04/16/2010  Findings: Cardiac shadow is mildly enlarged.  Mild vascular congestion is seen.  No focal confluent infiltrate is noted.  No bony abnormality is noted.  IMPRESSION: Mild Vascular congestion.   Original Report Authenticated By: Alcide Clever, M.D.    BMET    Component Value Date/Time   NA 141 01/13/2013 0205   K 4.2 01/13/2013 0205   CL 105 01/13/2013 0205   CO2 21 01/13/2013 0205   GLUCOSE 154* 01/13/2013 0205   BUN 110* 01/13/2013 0205   CREATININE 9.28* 01/13/2013 0205   CALCIUM 7.0* 01/13/2013 0205   GFRNONAA 5* 01/13/2013 0205   GFRAA 5* 01/13/2013 0205   CBC    Component Value Date/Time   WBC 4.8 01/13/2013 0205   RBC 3.17* 01/13/2013 0205   HGB 10.0* 01/13/2013 0205   HCT 29.5* 01/13/2013 0205   PLT 118*  01/13/2013 0205   MCV 93.1 01/13/2013 0205   MCH 31.5 01/13/2013 0205   MCHC 33.9 01/13/2013 0205   RDW 17.5* 01/13/2013 0205   LYMPHSABS 0.5* 01/12/2013 1230   MONOABS 0.4 01/12/2013 1230   EOSABS 0.1 01/12/2013 1230   BASOSABS 0.0 01/12/2013 1230     Assessment: 1. New ESRD 2. Probable Sz sec uremia and hypocalcemia 3. Anemia on aranesp 4. Gout Plan: 1. AVF infiltrated earlier today so will attempt HD later today, if unable then can wait til tomorrow 2.  DC bicarb and iron 3. Check PO4  Raja Liska T

## 2013-01-13 NOTE — Progress Notes (Addendum)
[facility] PROGRESS NOTE  Stefon Ramthun ZOX:[id number] DOB: 13-Jul-1932 DOA: 01/12/2013 PCP: Cecille Aver, MD  Assessment/Plan: Syncope [name] seizure -suspect seizure due to hypocalcemia and uremia -had symptoms of shakes and jerks for few days prior -improved now, started HD yesterday -continue Ca carbonate TID -CT head unremarkable -I do not think he needs MRI and EEG, will cancel this -no recent echo, agree with getting this.   . Anemia in chronic kidney disease  -Hb stable -aranesp per Renal  . Diabetes type 2, controlled  -Currently diet controlled, not on any oral hypoglycemic agents.  - SSI for now.   . Hypertension  -Continue with amlodipine.  -expect this to improve with HD  . Metabolic acidosis, increased anion gap  -Secondary to chronic kidney disease, improved, will resolve with HD -DC sodium bicarbonate   . Progressive CKD (chronic kidney disease) stage 5, GFR less than 15 ml/min  -now ESRD -started HD 5/9  DVT Prophylaxis:  Prophylactic Heparin   Code Status:  Full Code       Consultants:  Renal Dr.Mattingly  Procedures:  HD started 5/9  HPI/Subjective: Feels ok, had first HD treatment yesterday  Objective: Filed Vitals:   01/12/13 2055 01/13/13 0508 01/13/13 0645 01/13/13 0814  BP: 176/72 164/74 175/93 176/78  Pulse: 89 86 88 91  Temp: 99 F (37.2 C) 99.2 F (37.3 C) 98.4 F (36.9 C) 98.8 F (37.1 C)  TempSrc: Oral Oral Oral Oral  Resp: 18 18 16 18   Height: 6' (1.829 m)     Weight: 93 kg (205 lb 0.4 oz)  93 kg (205 lb 0.4 oz)   SpO2: 98% 96% 97% 100%    Intake/Output Summary (Last 24 hours) at 01/13/13 0852 Last data filed at 01/13/13 0149  Gross per 24 hour  Intake      0 ml  Output   1785 ml  Net  -1785 ml   Filed Weights   01/12/13 2055 01/13/13 0645  Weight: 93 kg (205 lb 0.4 oz) 93 kg (205 lb 0.4 oz)    Exam:   General:  AAOx3, laceration on forehead with sutures  HEENT: R eye subconjunctival  hge  Cardiovascular: S1S2/RRR  Respiratory: CTAB  Abdomen: soft, NT, BS present  Ext: no edema c/c  Data Reviewed: Basic Metabolic Panel:  Recent Labs Lab 01/12/13 1230 01/12/13 1910 01/13/13 0205  NA 139  --  141  K 5.0  --  4.2  CL 103  --  105  CO2 16*  --  21  GLUCOSE 173*  --  154*  BUN 146*  --  110*  CREATININE 10.77* 8.49* 9.28*  CALCIUM 6.3*  --  7.0*   Liver Function Tests:  Recent Labs Lab 01/13/13 0205  AST 14  ALT 8  ALKPHOS 57  BILITOT 0.4  PROT 6.8  ALBUMIN 3.7   No results found for this basename: LIPASE, AMYLASE,  in the last 168 hours No results found for this basename: AMMONIA,  in the last 168 hours CBC:  Recent Labs Lab 01/12/13 1230 01/12/13 1910 01/13/13 0205  WBC 7.7 6.8 4.8  NEUTROABS 6.7  --   --   HGB 10.7* 10.4* 10.0*  HCT 31.9* 29.6* 29.5*  MCV 93.5 92.2 93.1  PLT 134* 122* 118*   Cardiac Enzymes:  Recent Labs Lab 01/12/13 1910 01/13/13 0205 01/13/13 0728  TROPONINI <0.30 <0.30 <0.30   BNP (last 3 results) No results found for this basename: PROBNP,  in the last 8760 hours  CBG:  Recent Labs Lab 01/12/13 2058 01/13/13 0813  GLUCAP 188* 103*    No results found for this or any previous visit (from the past 240 hour(s)).   Studies: Ct Head Wo Contrast  01/12/2013  *RADIOLOGY REPORT*  Clinical Data: Fall, head injury The  CT HEAD WITHOUT CONTRAST,CT CERVICAL SPINE WITHOUT CONTRAST  Technique:  Contiguous axial images were obtained from the base of the skull through the vertex without contrast.,Technique: Multidetector CT imaging of the cervical spine was performed. Multiplanar CT image reconstructions were also generated.  Comparison: None.  Findings: No skull fracture is noted. The mastoid air cells are unremarkable.  No intracranial hemorrhage, mass effect or midline shift. Mild mucosal thickening bilateral posterior aspect of the maxillary sinus.  No acute infarction. No mass lesion is noted on this unenhanced  scan.  Bilateral basal ganglia punctate calcifications are noted.  Mild cerebral atrophy.  Periventricular and patchy subcortical white matter decreased attenuation probable due to chronic small vessel ischemic changes.  IMPRESSION: No acute intracranial abnormality.  Mild cerebral atrophy. Periventricular and subcortical white matter decreased attenuation probable due to chronic small vessel ischemic changes.  CT cervical spine without IV contrast:  Axial images of the cervical spine shows no acute fracture or subluxation.  Computer processed images shows mild degenerative changes C1-C2 articulation.  Minimal disc space flattening with mild posterior spurring at C2-C3 level.  There is mild disc space flattening with mild anterior and mild posterior spurring at C3-C4 level.  Moderate disc space flattening with mild anterior and mild posterior spurring at C4-C5 and C6-C7 level.  Mild disc space flattening with mild anterior spurring at C5-C6 level.  No prevertebral soft tissue swelling.  Cervical airway is patent.  There is no pneumothorax in visualized lung apices.  Impression:  1.  No acute fracture or subluxation.  Degenerative changes as described above.   Original Report Authenticated By: Natasha Mead, M.D.    Ct Cervical Spine Wo Contrast  01/12/2013  *RADIOLOGY REPORT*  Clinical Data: Fall, head injury The  CT HEAD WITHOUT CONTRAST,CT CERVICAL SPINE WITHOUT CONTRAST  Technique:  Contiguous axial images were obtained from the base of the skull through the vertex without contrast.,Technique: Multidetector CT imaging of the cervical spine was performed. Multiplanar CT image reconstructions were also generated.  Comparison: None.  Findings: No skull fracture is noted. The mastoid air cells are unremarkable.  No intracranial hemorrhage, mass effect or midline shift. Mild mucosal thickening bilateral posterior aspect of the maxillary sinus.  No acute infarction. No mass lesion is noted on this unenhanced scan.   Bilateral basal ganglia punctate calcifications are noted.  Mild cerebral atrophy.  Periventricular and patchy subcortical white matter decreased attenuation probable due to chronic small vessel ischemic changes.  IMPRESSION: No acute intracranial abnormality.  Mild cerebral atrophy. Periventricular and subcortical white matter decreased attenuation probable due to chronic small vessel ischemic changes.  CT cervical spine without IV contrast:  Axial images of the cervical spine shows no acute fracture or subluxation.  Computer processed images shows mild degenerative changes C1-C2 articulation.  Minimal disc space flattening with mild posterior spurring at C2-C3 level.  There is mild disc space flattening with mild anterior and mild posterior spurring at C3-C4 level.  Moderate disc space flattening with mild anterior and mild posterior spurring at C4-C5 and C6-C7 level.  Mild disc space flattening with mild anterior spurring at C5-C6 level.  No prevertebral soft tissue swelling.  Cervical airway is patent.  There is no pneumothorax  in visualized lung apices.  Impression:  1.  No acute fracture or subluxation.  Degenerative changes as described above.   Original Report Authenticated By: Natasha Mead, M.D.    Dg Chest Port 1 View  01/12/2013  *RADIOLOGY REPORT*  Clinical Data: Unresponsive, recent injury  PORTABLE CHEST - 1 VIEW  Comparison: 04/16/2010  Findings: Cardiac shadow is mildly enlarged.  Mild vascular congestion is seen.  No focal confluent infiltrate is noted.  No bony abnormality is noted.  IMPRESSION: Mild Vascular congestion.   Original Report Authenticated By: Alcide Clever, M.D.     Scheduled Meds: . [START ON 01/15/2013] allopurinol  100 mg Oral Custom  . amLODipine  10 mg Oral Daily  . calcium carbonate  2 tablet Oral TID WC  . calcium carbonate  1 tablet Oral TID WC  . darbepoetin (ARANESP) injection - DIALYSIS  60 mcg Intravenous Q Fri-HD  . heparin  5,000 Units Subcutaneous Q8H  . insulin  aspart  0-9 Units Subcutaneous TID WC  . multivitamin  1 tablet Oral Q supper  . pantoprazole  40 mg Oral BID AC  . sodium chloride  3 mL Intravenous Q12H  . sodium chloride  3 mL Intravenous Q12H   Continuous Infusions:   Principal Problem:   Syncope Active Problems:   Progressive CKD (chronic kidney disease) stage 5, GFR less than 15 ml/min   Hypertension   history of AVM (arteriovenous malformation) of colon with hemorrhage   Metabolic acidosis, increased anion gap   Anemia in chronic kidney disease   Diabetes type 2, controlled    Time spent:71min    [facility]  [facility] Pager [phone]. If 7PM-7AM, please contact night-coverage at [email or web address], password [facility] 01/13/2013, 8:52 AM  LOS: 1 day

## 2013-01-13 NOTE — Evaluation (Signed)
Physical Therapy Evaluation Patient Details Name: [name] MRN: [id number] DOB: [birth year] Today's Date: 01/13/2013 Time: 2952-8413 PT Time Calculation (min): 28 min  PT Assessment / Plan / Recommendation Clinical Impression  79 yo male admitted after a fall and decreased responsiveness at home likely due to seizure.  Pt reports he is doing much better today, but he still has a gait disturbance.  Pt started his first HD last night.  Hopeful that pt witll continue recovery and be able to d/c to home with HHPT , but he may benefit from short CIR stay if progress is slow    PT Assessment  Patient needs continued PT services    Follow Up Recommendations  Home health PT    Does the patient have the potential to tolerate intense rehabilitation      Barriers to Discharge        Equipment Recommendations  Rolling walker with 5" wheels    Recommendations for Other Services OT consult   Frequency Min 3X/week    Precautions / Restrictions     Pertinent Vitals/Pain Pt with no c/o pain      Mobility  Bed Mobility Bed Mobility: Supine to Sit Supine to Sit: 4: Min assist Transfers Transfers: Sit to Stand;Stand to Sit Sit to Stand: 4: Min assist Stand to Sit: 4: Min assist Details for Transfer Assistance: Pt impulsive .  Need assist to use RW for assist Ambulation/Gait Ambulation/Gait Assistance: 3: Mod assist Ambulation Distance (Feet): 15 Feet Assistive device: Rolling walker Ambulation/Gait Assistance Details: pt needed much assist for safety with RW.  Gait Pattern: Ataxic;Trunk flexed;Wide base of support Gait velocity: impulsive General Gait Details: Pt needed to use the bathroom, so gait was impulsive and uncontrolled.  Also uncontrolled on return to sink to wash hands and back to chair.  Pt was unable to stand erect at sink to wash hand after cleaning himself after BM.  He leaned on elbows and washed hands brusquely, ultimately dislodging IV in left hand.Pt did not want  to walk further at this time as he wanted to eat his lunch Stairs: No Wheelchair Mobility Wheelchair Mobility: No    Exercises     PT Diagnosis: Difficulty walking;Abnormality of gait;Generalized weakness  PT Problem List: Decreased activity tolerance;Decreased balance;Decreased mobility;Decreased coordination;Decreased safety awareness;Decreased knowledge of precautions PT Treatment Interventions: DME instruction;Gait training;Functional mobility training;Therapeutic activities;Therapeutic exercise;Balance training;Patient/family education   PT Goals Acute Rehab PT Goals PT Goal Formulation: With patient/family Time For Goal Achievement: 01/27/13 Potential to Achieve Goals: Good Pt will go Supine/Side to Sit: Independently PT Goal: Supine/Side to Sit - Progress: Goal set today Pt will go Sit to Supine/Side: Independently PT Goal: Sit to Supine/Side - Progress: Goal set today Pt will go Sit to Stand: with modified independence PT Goal: Sit to Stand - Progress: Goal set today Pt will go Stand to Sit: with modified independence PT Goal: Stand to Sit - Progress: Goal set today Pt will Ambulate: 51 - 150 feet;with least restrictive assistive device PT Goal: Ambulate - Progress: Goal set today  Visit Information  Last PT Received On: 01/13/13 Assistance Needed: +1    Subjective Data  Subjective: Oh , I've been up walking already Patient Stated Goal: to get back to the way he was   Prior Functioning  Home Living Lives With: Spouse Available Help at Discharge: Family Type of Home: House Home Layout: Two level Alternate Level Stairs-Number of Steps: stair lift to second story Home Adaptive Equipment: Straight cane Prior Function Level  of Independence: Independent Communication Communication: No difficulties    Cognition  Cognition Arousal/Alertness: Awake/alert Behavior During Therapy: WFL for tasks assessed/performed Overall Cognitive Status: Within Functional Limits for  tasks assessed    Extremity/Trunk Assessment Right Lower Extremity Assessment RLE ROM/Strength/Tone: WFL for tasks assessed Left Lower Extremity Assessment LLE ROM/Strength/Tone: WFL for tasks assessed   Balance Balance Balance Assessed: Yes Static Sitting Balance Static Sitting - Balance Support: No upper extremity supported Static Sitting - Level of Assistance: 5: Stand by assistance Static Standing Balance Static Standing - Balance Support: Bilateral upper extremity supported Static Standing - Level of Assistance: 4: Min assist  End of Session PT - End of Session Activity Tolerance: Patient tolerated treatment well Patient left: in chair;with family/visitor present Nurse Communication: Mobility status  GP   Bayard Hugger. Manson Passey, PT [phone]  01/13/2013, 1:24 PM

## 2013-01-13 NOTE — Progress Notes (Signed)
Hemodialysis- Attempted to cannulate pt with 17g needles x3. Venous needle infiltrated x2. Multiple RNs attempted. Ice applied to arm. Will have to try back later today. Dr. Briant Cedar notified. Pt states no complaints.

## 2013-01-14 DIAGNOSIS — N186 End stage renal disease: Secondary | ICD-10-CM

## 2013-01-14 LAB — GLUCOSE, CAPILLARY: Glucose-Capillary: 77 mg/dL (ref 70–99)

## 2013-01-14 MED ORDER — CEFAZOLIN SODIUM-DEXTROSE 2-3 GM-% IV SOLR
2.0000 g | INTRAVENOUS | Status: AC
  Administered 2013-01-15: 2 g via INTRAVENOUS
  Filled 2013-01-14: qty 50

## 2013-01-14 MED ORDER — HEPARIN SODIUM (PORCINE) 5000 UNIT/ML IJ SOLN
5000.0000 [IU] | Freq: Three times a day (TID) | INTRAMUSCULAR | Status: DC
  Administered 2013-01-14 – 2013-01-15 (×2): 5000 [IU] via SUBCUTANEOUS
  Filled 2013-01-14 (×6): qty 1

## 2013-01-14 MED ORDER — CLONIDINE HCL 0.1 MG PO TABS
0.1000 mg | ORAL_TABLET | ORAL | Status: DC | PRN
  Filled 2013-01-14: qty 1

## 2013-01-14 NOTE — Progress Notes (Signed)
Patient ID: [name], male   DOB: 1932-03-29, 79 y.o.   MRN: [id number] Request received for placement of a left tunneled HD catheter in pt with progressive CKD and infiltrated, poorly functioning right arm AVF. Additional PMH as below. Exam: pt awake/alert; chest- few bibasilar crackles; heart- RRR; abd-soft,+BS,NT; ext - no edema; rt arm AVF edematous/ecchymotic/mildly tender with positive thrill/bruit.   Filed Vitals:   01/13/13 1704 01/13/13 2050 01/14/13 0427 01/14/13 0941  BP: 159/71 160/76 161/73 169/79  Pulse: 83 86 82 80  Temp: 98.5 F (36.9 C) 98.8 F (37.1 C) 98.7 F (37.1 C) 98.6 F (37 C)  TempSrc: Oral Oral Oral Oral  Resp: 16 18 17 18   Height:      Weight:      SpO2: 99% 100% 98% 98%   Past Medical History  Diagnosis Date  . Hypertension   . Diabetes mellitus without complication   . Arthritis   . Renal disorder     chronic renal disease  . History of blood transfusion   . GI bleed   . History of blood transfusion    Past Surgical History  Procedure Laterality Date  . Esophagogastroduodenoscopy  10/03/2012    Procedure: ESOPHAGOGASTRODUODENOSCOPY (EGD);  Surgeon: Theda Belfast, MD;  Location: [facility] ENDOSCOPY;  Service: Endoscopy;  Laterality: N/A;  . Colonoscopy  10/04/2012    Procedure: COLONOSCOPY;  Surgeon: Theda Belfast, MD;  Location: [facility] ENDOSCOPY;  Service: Endoscopy;  Laterality: N/A;  . Colonoscopy with esophagogastroduodenoscopy (egd) Left 11/30/2012    Procedure: COLONOSCOPY WITH ESOPHAGOGASTRODUODENOSCOPY (EGD);  Surgeon: Theda Belfast, MD;  Location: [facility] ENDOSCOPY;  Service: Endoscopy;  Laterality: Left;  Ct Head Wo Contrast  01/12/2013  *RADIOLOGY REPORT*  Clinical Data: Fall, head injury The  CT HEAD WITHOUT CONTRAST,CT CERVICAL SPINE WITHOUT CONTRAST  Technique:  Contiguous axial images were obtained from the base of the skull through the vertex without contrast.,Technique: Multidetector CT imaging of the cervical spine was performed. Multiplanar CT  image reconstructions were also generated.  Comparison: None.  Findings: No skull fracture is noted. The mastoid air cells are unremarkable.  No intracranial hemorrhage, mass effect or midline shift. Mild mucosal thickening bilateral posterior aspect of the maxillary sinus.  No acute infarction. No mass lesion is noted on this unenhanced scan.  Bilateral basal ganglia punctate calcifications are noted.  Mild cerebral atrophy.  Periventricular and patchy subcortical white matter decreased attenuation probable due to chronic small vessel ischemic changes.  IMPRESSION: No acute intracranial abnormality.  Mild cerebral atrophy. Periventricular and subcortical white matter decreased attenuation probable due to chronic small vessel ischemic changes.  CT cervical spine without IV contrast:  Axial images of the cervical spine shows no acute fracture or subluxation.  Computer processed images shows mild degenerative changes C1-C2 articulation.  Minimal disc space flattening with mild posterior spurring at C2-C3 level.  There is mild disc space flattening with mild anterior and mild posterior spurring at C3-C4 level.  Moderate disc space flattening with mild anterior and mild posterior spurring at C4-C5 and C6-C7 level.  Mild disc space flattening with mild anterior spurring at C5-C6 level.  No prevertebral soft tissue swelling.  Cervical airway is patent.  There is no pneumothorax in visualized lung apices.  Impression:  1.  No acute fracture or subluxation.  Degenerative changes as described above.   Original Report Authenticated By: Natasha Mead, M.D.    Ct Cervical Spine Wo Contrast  01/12/2013  *RADIOLOGY REPORT*  Clinical Data: Fall, head injury  The  CT HEAD WITHOUT CONTRAST,CT CERVICAL SPINE WITHOUT CONTRAST  Technique:  Contiguous axial images were obtained from the base of the skull through the vertex without contrast.,Technique: Multidetector CT imaging of the cervical spine was performed. Multiplanar CT image  reconstructions were also generated.  Comparison: None.  Findings: No skull fracture is noted. The mastoid air cells are unremarkable.  No intracranial hemorrhage, mass effect or midline shift. Mild mucosal thickening bilateral posterior aspect of the maxillary sinus.  No acute infarction. No mass lesion is noted on this unenhanced scan.  Bilateral basal ganglia punctate calcifications are noted.  Mild cerebral atrophy.  Periventricular and patchy subcortical white matter decreased attenuation probable due to chronic small vessel ischemic changes.  IMPRESSION: No acute intracranial abnormality.  Mild cerebral atrophy. Periventricular and subcortical white matter decreased attenuation probable due to chronic small vessel ischemic changes.  CT cervical spine without IV contrast:  Axial images of the cervical spine shows no acute fracture or subluxation.  Computer processed images shows mild degenerative changes C1-C2 articulation.  Minimal disc space flattening with mild posterior spurring at C2-C3 level.  There is mild disc space flattening with mild anterior and mild posterior spurring at C3-C4 level.  Moderate disc space flattening with mild anterior and mild posterior spurring at C4-C5 and C6-C7 level.  Mild disc space flattening with mild anterior spurring at C5-C6 level.  No prevertebral soft tissue swelling.  Cervical airway is patent.  There is no pneumothorax in visualized lung apices.  Impression:  1.  No acute fracture or subluxation.  Degenerative changes as described above.   Original Report Authenticated By: Natasha Mead, M.D.    Dg Chest Port 1 View  01/12/2013  *RADIOLOGY REPORT*  Clinical Data: Unresponsive, recent injury  PORTABLE CHEST - 1 VIEW  Comparison: 04/16/2010  Findings: Cardiac shadow is mildly enlarged.  Mild vascular congestion is seen.  No focal confluent infiltrate is noted.  No bony abnormality is noted.  IMPRESSION: Mild Vascular congestion.   Original Report Authenticated By: Alcide Clever, M.D.   Results for orders placed during the hospital encounter of 01/12/13  CBC WITH DIFFERENTIAL      Result Value Range   WBC 7.7  4.0 - 10.5 K/uL   RBC 3.41 (*) 4.22 - 5.81 MIL/uL   Hemoglobin 10.7 (*) 13.0 - 17.0 g/dL   HCT 16.1 (*) 09.6 - 04.5 %   MCV 93.5  78.0 - 100.0 fL   MCH 31.4  26.0 - 34.0 pg   MCHC 33.5  30.0 - 36.0 g/dL   RDW 40.9 (*) 81.1 - 91.4 %   Platelets 134 (*) 150 - 400 K/uL   Neutrophils Relative 87 (*) 43 - 77 %   Neutro Abs 6.7  1.7 - 7.7 K/uL   Lymphocytes Relative 7 (*) 12 - 46 %   Lymphs Abs 0.5 (*) 0.7 - 4.0 K/uL   Monocytes Relative 6  3 - 12 %   Monocytes Absolute 0.4  0.1 - 1.0 K/uL   Eosinophils Relative 1  0 - 5 %   Eosinophils Absolute 0.1  0.0 - 0.7 K/uL   Basophils Relative 0  0 - 1 %   Basophils Absolute 0.0  0.0 - 0.1 K/uL  BASIC METABOLIC PANEL      Result Value Range   Sodium 139  135 - 145 mEq/L   Potassium 5.0  3.5 - 5.1 mEq/L   Chloride 103  96 - 112 mEq/L   CO2 16 (*) 19 -  32 mEq/L   Glucose, Bld 173 (*) 70 - 99 mg/dL   BUN 161 (*) 6 - 23 mg/dL   Creatinine, Ser 09.60 (*) 0.50 - 1.35 mg/dL   Calcium 6.3 (*) 8.4 - 10.5 mg/dL   GFR calc non Af Amer 4 (*) >90 mL/min   GFR calc Af Amer 5 (*) >90 mL/min  PROTIME-INR      Result Value Range   Prothrombin Time 14.3  11.6 - 15.2 seconds   INR 1.13  0.00 - 1.49  APTT      Result Value Range   aPTT 29  24 - 37 seconds  URINALYSIS, ROUTINE W REFLEX MICROSCOPIC      Result Value Range   Color, Urine YELLOW  YELLOW   APPearance CLEAR  CLEAR   Specific Gravity, Urine 1.013  1.005 - 1.030   pH 5.5  5.0 - 8.0   Glucose, UA NEGATIVE  NEGATIVE mg/dL   Hgb urine dipstick MODERATE (*) NEGATIVE   Bilirubin Urine NEGATIVE  NEGATIVE   Ketones, ur NEGATIVE  NEGATIVE mg/dL   Protein, ur >454 (*) NEGATIVE mg/dL   Urobilinogen, UA 0.2  0.0 - 1.0 mg/dL   Nitrite NEGATIVE  NEGATIVE   Leukocytes, UA NEGATIVE  NEGATIVE  URINE MICROSCOPIC-ADD ON      Result Value Range   Squamous  Epithelial / LPF RARE  RARE   WBC, UA 0-2  <3 WBC/hpf   RBC / HPF 3-6  <3 RBC/hpf   Bacteria, UA RARE  RARE  HEPATITIS B SURFACE ANTIBODY      Result Value Range   Hep B S Ab NONREACTIVE  NONREACTIVE  HEPATITIS B SURFACE ANTIGEN      Result Value Range   Hepatitis B Surface Ag NEGATIVE  NEGATIVE  HEPATITIS B CORE ANTIBODY, TOTAL      Result Value Range   Hep B Core Total Ab NEGATIVE  NEGATIVE  CBC      Result Value Range   WBC 6.8  4.0 - 10.5 K/uL   RBC 3.21 (*) 4.22 - 5.81 MIL/uL   Hemoglobin 10.4 (*) 13.0 - 17.0 g/dL   HCT 09.8 (*) 11.9 - 14.7 %   MCV 92.2  78.0 - 100.0 fL   MCH 32.4  26.0 - 34.0 pg   MCHC 35.1  30.0 - 36.0 g/dL   RDW 82.9 (*) 56.2 - 13.0 %   Platelets 122 (*) 150 - 400 K/uL  CREATININE, SERUM      Result Value Range   Creatinine, Ser 8.49 (*) 0.50 - 1.35 mg/dL   GFR calc non Af Amer 5 (*) >90 mL/min   GFR calc Af Amer 6 (*) >90 mL/min  CBC      Result Value Range   WBC 4.8  4.0 - 10.5 K/uL   RBC 3.17 (*) 4.22 - 5.81 MIL/uL   Hemoglobin 10.0 (*) 13.0 - 17.0 g/dL   HCT 86.5 (*) 78.4 - 69.6 %   MCV 93.1  78.0 - 100.0 fL   MCH 31.5  26.0 - 34.0 pg   MCHC 33.9  30.0 - 36.0 g/dL   RDW 29.5 (*) 28.4 - 13.2 %   Platelets 118 (*) 150 - 400 K/uL  COMPREHENSIVE METABOLIC PANEL      Result Value Range   Sodium 141  135 - 145 mEq/L   Potassium 4.2  3.5 - 5.1 mEq/L   Chloride 105  96 - 112 mEq/L   CO2 21  19 - 32  mEq/L   Glucose, Bld 154 (*) 70 - 99 mg/dL   BUN 161 (*) 6 - 23 mg/dL   Creatinine, Ser 0.96 (*) 0.50 - 1.35 mg/dL   Calcium 7.0 (*) 8.4 - 10.5 mg/dL   Total Protein 6.8  6.0 - 8.3 g/dL   Albumin 3.7  3.5 - 5.2 g/dL   AST 14  0 - 37 U/L   ALT 8  0 - 53 U/L   Alkaline Phosphatase 57  39 - 117 U/L   Total Bilirubin 0.4  0.3 - 1.2 mg/dL   GFR calc non Af Amer 5 (*) >90 mL/min   GFR calc Af Amer 5 (*) >90 mL/min  TROPONIN I      Result Value Range   Troponin I <0.30  <0.30 ng/mL  TROPONIN I      Result Value Range   Troponin I <0.30  <0.30  ng/mL  TROPONIN I      Result Value Range   Troponin I <0.30  <0.30 ng/mL  GLUCOSE, CAPILLARY      Result Value Range   Glucose-Capillary 188 (*) 70 - 99 mg/dL  GLUCOSE, CAPILLARY      Result Value Range   Glucose-Capillary 103 (*) 70 - 99 mg/dL  PHOSPHORUS      Result Value Range   Phosphorus 4.8 (*) 2.3 - 4.6 mg/dL  GLUCOSE, CAPILLARY      Result Value Range   Glucose-Capillary 152 (*) 70 - 99 mg/dL  GLUCOSE, CAPILLARY      Result Value Range   Glucose-Capillary 193 (*) 70 - 99 mg/dL  GLUCOSE, CAPILLARY      Result Value Range   Glucose-Capillary 219 (*) 70 - 99 mg/dL  GLUCOSE, CAPILLARY      Result Value Range   Glucose-Capillary 114 (*) 70 - 99 mg/dL  TYPE AND SCREEN      Result Value Range   ABO/RH(D) AB POS     Antibody Screen NEG     Sample Expiration 01/15/2013     A/P: Pt with progressive CKD and infiltrated/poorly functioning rt arm AVF. Plan is for placement of a left tunneled HD catheter on 5/12. Details/risks of procedure d/w pt with his understanding and consent.

## 2013-01-14 NOTE — Progress Notes (Signed)
S: No new CO.  No further seizures O:BP 169/79  Pulse 80  Temp(Src) 98.6 F (37 C) (Oral)  Resp 18  Ht 6' (1.829 m)  Wt 94.4 kg (208 lb 1.8 oz)  BMI 28.22 kg/m2  SpO2 98%  Intake/Output Summary (Last 24 hours) at 01/14/13 0954 Last data filed at 01/14/13 0900  Gross per 24 hour  Intake   1120 ml  Output   1060 ml  Net     60 ml   Weight change: 1.4 kg (3 lb 1.4 oz) BMW:UXLKG and alert CVS:RRR Resp:clear Abd:+ BS NTND Ext:no edema.Marland Kitchen + infiltration RUA AVF but still with bruit NEURO:CNI OX3 no asterixis    . [START ON 01/15/2013] allopurinol  100 mg Oral Custom  . amLODipine  10 mg Oral Daily  . calcium carbonate  2 tablet Oral TID WC  . calcium carbonate  1 tablet Oral TID WC  . darbepoetin (ARANESP) injection - DIALYSIS  60 mcg Intravenous Q Fri-HD  . heparin  5,000 Units Subcutaneous Q8H  . insulin aspart  0-9 Units Subcutaneous TID WC  . multivitamin  1 tablet Oral Q supper  . pantoprazole  40 mg Oral BID AC  . sodium chloride  3 mL Intravenous Q12H  . sodium chloride  3 mL Intravenous Q12H   Ct Head Wo Contrast  01/12/2013  *RADIOLOGY REPORT*  Clinical Data: Fall, head injury The  CT HEAD WITHOUT CONTRAST,CT CERVICAL SPINE WITHOUT CONTRAST  Technique:  Contiguous axial images were obtained from the base of the skull through the vertex without contrast.,Technique: Multidetector CT imaging of the cervical spine was performed. Multiplanar CT image reconstructions were also generated.  Comparison: None.  Findings: No skull fracture is noted. The mastoid air cells are unremarkable.  No intracranial hemorrhage, mass effect or midline shift. Mild mucosal thickening bilateral posterior aspect of the maxillary sinus.  No acute infarction. No mass lesion is noted on this unenhanced scan.  Bilateral basal ganglia punctate calcifications are noted.  Mild cerebral atrophy.  Periventricular and patchy subcortical white matter decreased attenuation probable due to chronic small vessel  ischemic changes.  IMPRESSION: No acute intracranial abnormality.  Mild cerebral atrophy. Periventricular and subcortical white matter decreased attenuation probable due to chronic small vessel ischemic changes.  CT cervical spine without IV contrast:  Axial images of the cervical spine shows no acute fracture or subluxation.  Computer processed images shows mild degenerative changes C1-C2 articulation.  Minimal disc space flattening with mild posterior spurring at C2-C3 level.  There is mild disc space flattening with mild anterior and mild posterior spurring at C3-C4 level.  Moderate disc space flattening with mild anterior and mild posterior spurring at C4-C5 and C6-C7 level.  Mild disc space flattening with mild anterior spurring at C5-C6 level.  No prevertebral soft tissue swelling.  Cervical airway is patent.  There is no pneumothorax in visualized lung apices.  Impression:  1.  No acute fracture or subluxation.  Degenerative changes as described above.   Original Report Authenticated By: Natasha Mead, M.D.    Ct Cervical Spine Wo Contrast  01/12/2013  *RADIOLOGY REPORT*  Clinical Data: Fall, head injury The  CT HEAD WITHOUT CONTRAST,CT CERVICAL SPINE WITHOUT CONTRAST  Technique:  Contiguous axial images were obtained from the base of the skull through the vertex without contrast.,Technique: Multidetector CT imaging of the cervical spine was performed. Multiplanar CT image reconstructions were also generated.  Comparison: None.  Findings: No skull fracture is noted. The mastoid air cells are  unremarkable.  No intracranial hemorrhage, mass effect or midline shift. Mild mucosal thickening bilateral posterior aspect of the maxillary sinus.  No acute infarction. No mass lesion is noted on this unenhanced scan.  Bilateral basal ganglia punctate calcifications are noted.  Mild cerebral atrophy.  Periventricular and patchy subcortical white matter decreased attenuation probable due to chronic small vessel ischemic  changes.  IMPRESSION: No acute intracranial abnormality.  Mild cerebral atrophy. Periventricular and subcortical white matter decreased attenuation probable due to chronic small vessel ischemic changes.  CT cervical spine without IV contrast:  Axial images of the cervical spine shows no acute fracture or subluxation.  Computer processed images shows mild degenerative changes C1-C2 articulation.  Minimal disc space flattening with mild posterior spurring at C2-C3 level.  There is mild disc space flattening with mild anterior and mild posterior spurring at C3-C4 level.  Moderate disc space flattening with mild anterior and mild posterior spurring at C4-C5 and C6-C7 level.  Mild disc space flattening with mild anterior spurring at C5-C6 level.  No prevertebral soft tissue swelling.  Cervical airway is patent.  There is no pneumothorax in visualized lung apices.  Impression:  1.  No acute fracture or subluxation.  Degenerative changes as described above.   Original Report Authenticated By: Natasha Mead, M.D.    Dg Chest Port 1 View  01/12/2013  *RADIOLOGY REPORT*  Clinical Data: Unresponsive, recent injury  PORTABLE CHEST - 1 VIEW  Comparison: 04/16/2010  Findings: Cardiac shadow is mildly enlarged.  Mild vascular congestion is seen.  No focal confluent infiltrate is noted.  No bony abnormality is noted.  IMPRESSION: Mild Vascular congestion.   Original Report Authenticated By: Alcide Clever, M.D.    BMET    Component Value Date/Time   NA 141 01/13/2013 0205   K 4.2 01/13/2013 0205   CL 105 01/13/2013 0205   CO2 21 01/13/2013 0205   GLUCOSE 154* 01/13/2013 0205   BUN 110* 01/13/2013 0205   CREATININE 9.28* 01/13/2013 0205   CALCIUM 7.0* 01/13/2013 0205   GFRNONAA 5* 01/13/2013 0205   GFRAA 5* 01/13/2013 0205   CBC    Component Value Date/Time   WBC 4.8 01/13/2013 0205   RBC 3.17* 01/13/2013 0205   HGB 10.0* 01/13/2013 0205   HCT 29.5* 01/13/2013 0205   PLT 118* 01/13/2013 0205   MCV 93.1 01/13/2013 0205   MCH 31.5  01/13/2013 0205   MCHC 33.9 01/13/2013 0205   RDW 17.5* 01/13/2013 0205   LYMPHSABS 0.5* 01/12/2013 1230   MONOABS 0.4 01/12/2013 1230   EOSABS 0.1 01/12/2013 1230   BASOSABS 0.0 01/12/2013 1230     Assessment: 1. New ESRD 2. Probable Sz sec uremia and hypocalcemia 3. Anemia on aranesp 4. Gout 5. HTN Plan: 1. Permcath in AM and then HD 2. Awaiting outpt spot for HD 3.  Hold off on adding new BP meds until we see what BP does with HD.  Will use prn clonidine in meantime        [name]

## 2013-01-14 NOTE — Progress Notes (Addendum)
[facility] PROGRESS NOTE  [name] ZOX:[id number] DOB: 07-04-1932 DOA: 01/12/2013 PCP: Cecille Aver, MD  Assessment/Plan: Syncope [name] seizure -suspect seizure due to hypocalcemia and uremia -had symptoms of shakes and jerks for few days prior -improved now, started HD 5/9 -continue Ca carbonate TID -CT head unremarkable -I do not think he needs MRI and EEG, cancelled this -FU 2D ECHO.   . Progressive CKD (chronic kidney disease) stage 5, GFR less than 15 ml/min  -now ESRD -started HD 5/9 -access issues with? HD catheter placement , d/w dr.Schertz  . Anemia in chronic kidney disease  -Hb stable -aranesp per Renal  . Diabetes type 2, controlled  -Currently diet controlled, not on any oral hypoglycemic agents.  - SSI for now.   . Hypertension  -Continue with amlodipine.  -expect this to improve with HD  . Metabolic acidosis, increased anion gap  -Secondary to chronic kidney disease, improved, will resolve with HD -DC sodium bicarbonate    DVT Prophylaxis:  Prophylactic Heparin   Code Status:  Full Code       Consultants:  Renal Dr.Mattingly/Schertz  Procedures:  HD started 5/9  HPI/Subjective: Feels ok, no Sob, dizziness, jerks NPO for HD catheter  Objective: Filed Vitals:   01/13/13 1604 01/13/13 1704 01/13/13 2050 01/14/13 0427  BP: 170/85 159/71 160/76 161/73  Pulse: 82 83 86 82  Temp: 97.9 F (36.6 C) 98.5 F (36.9 C) 98.8 F (37.1 C) 98.7 F (37.1 C)  TempSrc: Oral Oral Oral Oral  Resp: 15 16 18 17   Height:      Weight: 94.4 kg (208 lb 1.8 oz)     SpO2: 98% 99% 100% 98%    Intake/Output Summary (Last 24 hours) at 01/14/13 0939 Last data filed at 01/14/13 0900  Gross per 24 hour  Intake   1120 ml  Output   1060 ml  Net     60 ml   Filed Weights   01/12/13 2055 01/13/13 0645 01/13/13 1604  Weight: 93 kg (205 lb 0.4 oz) 93 kg (205 lb 0.4 oz) 94.4 kg (208 lb 1.8 oz)    Exam:   General:  AAOx3, laceration  on forehead with sutures  HEENT: R eye subconjunctival hge  Cardiovascular: S1S2/RRR  Respiratory: CTAB  Abdomen: soft, NT, BS present  Ext: no edema c/c  Data Reviewed: Basic Metabolic Panel:  Recent Labs Lab 01/12/13 1230 01/12/13 1910 01/13/13 0205 01/13/13 0728  NA 139  --  141  --   K 5.0  --  4.2  --   CL 103  --  105  --   CO2 16*  --  21  --   GLUCOSE 173*  --  154*  --   BUN 146*  --  110*  --   CREATININE 10.77* 8.49* 9.28*  --   CALCIUM 6.3*  --  7.0*  --   PHOS  --   --   --  4.8*   Liver Function Tests:  Recent Labs Lab 01/13/13 0205  AST 14  ALT 8  ALKPHOS 57  BILITOT 0.4  PROT 6.8  ALBUMIN 3.7   No results found for this basename: LIPASE, AMYLASE,  in the last 168 hours No results found for this basename: AMMONIA,  in the last 168 hours CBC:  Recent Labs Lab 01/12/13 1230 01/12/13 1910 01/13/13 0205  WBC 7.7 6.8 4.8  NEUTROABS 6.7  --   --   HGB 10.7* 10.4* 10.0*  HCT 31.9* 29.6* 29.5*  MCV 93.5 92.2 93.1  PLT 134* 122* 118*   Cardiac Enzymes:  Recent Labs Lab 01/12/13 1910 01/13/13 0205 01/13/13 0728  TROPONINI <0.30 <0.30 <0.30   BNP (last 3 results) No results found for this basename: PROBNP,  in the last 8760 hours CBG:  Recent Labs Lab 01/13/13 0813 01/13/13 1244 01/13/13 1706 01/13/13 2134 01/14/13 0751  GLUCAP 103* 152* 193* 219* 114*    No results found for this or any previous visit (from the past 240 hour(s)).   Studies: Ct Head Wo Contrast  01/12/2013  *RADIOLOGY REPORT*  Clinical Data: Fall, head injury The  CT HEAD WITHOUT CONTRAST,CT CERVICAL SPINE WITHOUT CONTRAST  Technique:  Contiguous axial images were obtained from the base of the skull through the vertex without contrast.,Technique: Multidetector CT imaging of the cervical spine was performed. Multiplanar CT image reconstructions were also generated.  Comparison: None.  Findings: No skull fracture is noted. The mastoid air cells are unremarkable.   No intracranial hemorrhage, mass effect or midline shift. Mild mucosal thickening bilateral posterior aspect of the maxillary sinus.  No acute infarction. No mass lesion is noted on this unenhanced scan.  Bilateral basal ganglia punctate calcifications are noted.  Mild cerebral atrophy.  Periventricular and patchy subcortical white matter decreased attenuation probable due to chronic small vessel ischemic changes.  IMPRESSION: No acute intracranial abnormality.  Mild cerebral atrophy. Periventricular and subcortical white matter decreased attenuation probable due to chronic small vessel ischemic changes.  CT cervical spine without IV contrast:  Axial images of the cervical spine shows no acute fracture or subluxation.  Computer processed images shows mild degenerative changes C1-C2 articulation.  Minimal disc space flattening with mild posterior spurring at C2-C3 level.  There is mild disc space flattening with mild anterior and mild posterior spurring at C3-C4 level.  Moderate disc space flattening with mild anterior and mild posterior spurring at C4-C5 and C6-C7 level.  Mild disc space flattening with mild anterior spurring at C5-C6 level.  No prevertebral soft tissue swelling.  Cervical airway is patent.  There is no pneumothorax in visualized lung apices.  Impression:  1.  No acute fracture or subluxation.  Degenerative changes as described above.   Original Report Authenticated By: Natasha Mead, M.D.    Ct Cervical Spine Wo Contrast  01/12/2013  *RADIOLOGY REPORT*  Clinical Data: Fall, head injury The  CT HEAD WITHOUT CONTRAST,CT CERVICAL SPINE WITHOUT CONTRAST  Technique:  Contiguous axial images were obtained from the base of the skull through the vertex without contrast.,Technique: Multidetector CT imaging of the cervical spine was performed. Multiplanar CT image reconstructions were also generated.  Comparison: None.  Findings: No skull fracture is noted. The mastoid air cells are unremarkable.  No  intracranial hemorrhage, mass effect or midline shift. Mild mucosal thickening bilateral posterior aspect of the maxillary sinus.  No acute infarction. No mass lesion is noted on this unenhanced scan.  Bilateral basal ganglia punctate calcifications are noted.  Mild cerebral atrophy.  Periventricular and patchy subcortical white matter decreased attenuation probable due to chronic small vessel ischemic changes.  IMPRESSION: No acute intracranial abnormality.  Mild cerebral atrophy. Periventricular and subcortical white matter decreased attenuation probable due to chronic small vessel ischemic changes.  CT cervical spine without IV contrast:  Axial images of the cervical spine shows no acute fracture or subluxation.  Computer processed images shows mild degenerative changes C1-C2 articulation.  Minimal disc space flattening with mild posterior spurring at C2-C3 level.  There is mild disc space  flattening with mild anterior and mild posterior spurring at C3-C4 level.  Moderate disc space flattening with mild anterior and mild posterior spurring at C4-C5 and C6-C7 level.  Mild disc space flattening with mild anterior spurring at C5-C6 level.  No prevertebral soft tissue swelling.  Cervical airway is patent.  There is no pneumothorax in visualized lung apices.  Impression:  1.  No acute fracture or subluxation.  Degenerative changes as described above.   Original Report Authenticated By: Natasha Mead, M.D.    Dg Chest Port 1 View  01/12/2013  *RADIOLOGY REPORT*  Clinical Data: Unresponsive, recent injury  PORTABLE CHEST - 1 VIEW  Comparison: 04/16/2010  Findings: Cardiac shadow is mildly enlarged.  Mild vascular congestion is seen.  No focal confluent infiltrate is noted.  No bony abnormality is noted.  IMPRESSION: Mild Vascular congestion.   Original Report Authenticated By: Alcide Clever, M.D.     Scheduled Meds: . [START ON 01/15/2013] allopurinol  100 mg Oral Custom  . amLODipine  10 mg Oral Daily  . calcium  carbonate  2 tablet Oral TID WC  . calcium carbonate  1 tablet Oral TID WC  . darbepoetin (ARANESP) injection - DIALYSIS  60 mcg Intravenous Q Fri-HD  . heparin  5,000 Units Subcutaneous Q8H  . insulin aspart  0-9 Units Subcutaneous TID WC  . multivitamin  1 tablet Oral Q supper  . pantoprazole  40 mg Oral BID AC  . sodium chloride  3 mL Intravenous Q12H  . sodium chloride  3 mL Intravenous Q12H   Continuous Infusions:   Principal Problem:   Syncope Active Problems:   Progressive CKD (chronic kidney disease) stage 5, GFR less than 15 ml/min   Hypertension   history of AVM (arteriovenous malformation) of colon with hemorrhage   Metabolic acidosis, increased anion gap   Anemia in chronic kidney disease   Diabetes type 2, controlled    Time spent:54min    [facility]  [facility] Pager [phone]. If 7PM-7AM, please contact night-coverage at [email or web address], password [email or web address] 01/14/2013, 9:39 AM  LOS: 2 days

## 2013-01-15 ENCOUNTER — Encounter (HOSPITAL_COMMUNITY): Payer: Self-pay | Admitting: *Deleted

## 2013-01-15 ENCOUNTER — Inpatient Hospital Stay (HOSPITAL_COMMUNITY): Payer: Medicare Other

## 2013-01-15 DIAGNOSIS — R569 Unspecified convulsions: Principal | ICD-10-CM

## 2013-01-15 LAB — BASIC METABOLIC PANEL
BUN: 116 mg/dL — ABNORMAL HIGH (ref 6–23)
Creatinine, Ser: 10.92 mg/dL — ABNORMAL HIGH (ref 0.50–1.35)
GFR calc non Af Amer: 4 mL/min — ABNORMAL LOW (ref >90)
Glucose, Bld: 242 mg/dL — ABNORMAL HIGH (ref 70–99)
Potassium: 4.9 mEq/L (ref 3.5–5.1)

## 2013-01-15 LAB — CBC
HCT: 27.3 % — ABNORMAL LOW (ref 39.0–52.0)
Hemoglobin: 9.4 g/dL — ABNORMAL LOW (ref 13.0–17.0)
MCH: 32.1 pg (ref 26.0–34.0)
MCHC: 34.4 g/dL (ref 30.0–36.0)
RDW: 16.7 % — ABNORMAL HIGH (ref 11.5–15.5)

## 2013-01-15 LAB — GLUCOSE, CAPILLARY

## 2013-01-15 MED ORDER — DOXERCALCIFEROL 4 MCG/2ML IV SOLN
3.0000 ug | INTRAVENOUS | Status: DC
  Filled 2013-01-15 (×2): qty 2

## 2013-01-15 MED ORDER — FENTANYL CITRATE 0.05 MG/ML IJ SOLN
INTRAMUSCULAR | Status: AC
  Filled 2013-01-15: qty 2

## 2013-01-15 MED ORDER — MIDAZOLAM HCL 2 MG/2ML IJ SOLN
INTRAMUSCULAR | Status: AC
  Filled 2013-01-15: qty 2

## 2013-01-15 MED ORDER — VALPROIC ACID 250 MG PO CAPS
500.0000 mg | ORAL_CAPSULE | Freq: Two times a day (BID) | ORAL | Status: DC
  Administered 2013-01-15 – 2013-01-17 (×5): 500 mg via ORAL
  Filled 2013-01-15 (×7): qty 2

## 2013-01-15 MED ORDER — INSULIN ASPART 100 UNIT/ML ~~LOC~~ SOLN
0.0000 [IU] | SUBCUTANEOUS | Status: DC
  Administered 2013-01-15: 3 [IU] via SUBCUTANEOUS

## 2013-01-15 MED ORDER — VALPROATE SODIUM 500 MG/5ML IV SOLN
1.0000 g | Freq: Once | INTRAVENOUS | Status: DC
  Filled 2013-01-15: qty 10

## 2013-01-15 MED ORDER — LORAZEPAM 2 MG/ML IJ SOLN
1.0000 mg | INTRAMUSCULAR | Status: DC | PRN
  Administered 2013-01-15: 1 mg via INTRAVENOUS
  Filled 2013-01-15: qty 1

## 2013-01-15 MED ORDER — HEPARIN SODIUM (PORCINE) 1000 UNIT/ML DIALYSIS
20.0000 [IU]/kg | INTRAMUSCULAR | Status: DC | PRN
  Filled 2013-01-15: qty 2

## 2013-01-15 MED ORDER — GELATIN ABSORBABLE 12-7 MM EX MISC
CUTANEOUS | Status: AC
  Filled 2013-01-15: qty 1

## 2013-01-15 MED ORDER — SODIUM CHLORIDE 0.9 % IV SOLN
1.0000 g | INTRAVENOUS | Status: AC
  Administered 2013-01-15: 1 g via INTRAVENOUS
  Filled 2013-01-15: qty 10

## 2013-01-15 MED ORDER — FENTANYL CITRATE 0.05 MG/ML IJ SOLN
INTRAMUSCULAR | Status: AC | PRN
  Administered 2013-01-15 (×2): 50 ug via INTRAVENOUS

## 2013-01-15 MED ORDER — HEPARIN SODIUM (PORCINE) 1000 UNIT/ML IJ SOLN
INTRAMUSCULAR | Status: AC
  Filled 2013-01-15: qty 1

## 2013-01-15 MED ORDER — MIDAZOLAM HCL 2 MG/2ML IJ SOLN
INTRAMUSCULAR | Status: AC | PRN
  Administered 2013-01-15 (×2): 1 mg via INTRAVENOUS

## 2013-01-15 NOTE — Progress Notes (Signed)
agree

## 2013-01-15 NOTE — Progress Notes (Signed)
Subjective:  No new complaints- now s/p PC Objective Vital signs in last 24 hours: Filed Vitals:   01/15/13 0939 01/15/13 1016 01/15/13 1046 01/15/13 1146  BP: 162/68 169/65 163/62 161/59  Pulse: 82 87 88 91  Temp:  98.6 F (37 C)  97.2 F (36.2 C)  TempSrc:  Oral  Oral  Resp: 19 18 18 18   Height:      Weight:      SpO2: 100% 100% 99% 100%   Weight change: 0.1 kg (3.5 oz)  Intake/Output Summary (Last 24 hours) at 01/15/13 1327 Last data filed at 01/15/13 1100  Gross per 24 hour  Intake    600 ml  Output   1050 ml  Net   -450 ml   Labs: Basic Metabolic Panel:  Recent Labs Lab 01/12/13 1230 01/12/13 1910 01/13/13 0205 01/13/13 0728  NA 139  --  141  --   K 5.0  --  4.2  --   CL 103  --  105  --   CO2 16*  --  21  --   GLUCOSE 173*  --  154*  --   BUN 146*  --  110*  --   CREATININE 10.77* 8.49* 9.28*  --   CALCIUM 6.3*  --  7.0*  --   PHOS  --   --   --  4.8*   Liver Function Tests:  Recent Labs Lab 01/13/13 0205  AST 14  ALT 8  ALKPHOS 57  BILITOT 0.4  PROT 6.8  ALBUMIN 3.7   No results found for this basename: LIPASE, AMYLASE,  in the last 168 hours No results found for this basename: AMMONIA,  in the last 168 hours CBC:  Recent Labs Lab 01/12/13 1230 01/12/13 1910 01/13/13 0205  WBC 7.7 6.8 4.8  NEUTROABS 6.7  --   --   HGB 10.7* 10.4* 10.0*  HCT 31.9* 29.6* 29.5*  MCV 93.5 92.2 93.1  PLT 134* 122* 118*   Cardiac Enzymes:  Recent Labs Lab 01/12/13 1910 01/13/13 0205 01/13/13 0728  TROPONINI <0.30 <0.30 <0.30   CBG:  Recent Labs Lab 01/14/13 1139 01/14/13 1606 01/14/13 2042 01/15/13 0736 01/15/13 1149  GLUCAP 231* 77 266* 148* 213*    Iron Studies: No results found for this basename: IRON, TIBC, TRANSFERRIN, FERRITIN,  in the last 72 hours Studies/Results: No results found. Medications: Infusions:    Scheduled Medications: . allopurinol  100 mg Oral Custom  . amLODipine  10 mg Oral Daily  . calcium carbonate  2  tablet Oral TID WC  . calcium carbonate  1 tablet Oral TID WC  . darbepoetin (ARANESP) injection - DIALYSIS  60 mcg Intravenous Q Fri-HD  . fentaNYL      . gelatin adsorbable      . heparin      . heparin  5,000 Units Subcutaneous Q8H  . insulin aspart  0-9 Units Subcutaneous TID WC  . midazolam      . multivitamin  1 tablet Oral Q supper  . pantoprazole  40 mg Oral BID AC  . sodium chloride  3 mL Intravenous Q12H  . sodium chloride  3 mL Intravenous Q12H    have reviewed scheduled and prn medications.  Physical Exam: General: NAD, wound over left eye Heart: RRR Lungs: mostly clear Abdomen: soft, non tender Extremities: minimal edema Dialysis Access: right upper arm avf with big infiltration- good thrill, new left sided PC   I Assessment/ Plan: Pt is a 80  y.o. yo male who was admitted on 01/12/2013 with  sz and hypocalcemia, now is new start to HD  Assessment/Plan: 1. Renal- new start to HD.  Got a little bit of treatment on 5/9 but then AVF infiltrated so not able to get any more- now s/p PC so AVF can rest.  Will plan on doing HD today and tomorrow and then to keep on TTS schedule.  Need to make arrangements for OP HD (I think [place] is closest) 2. HTN/volume- reasonable norvasc 10, will follow- may be able to decrease meds as HD gets started 3. Anemia- was on procrit- now on aranesp here 4. Secondary hyperparathyroidism- on TUMS for binding- recent PTH pending- on 3/31 was 672- will give hectorol with HD.  Continue renavite 5. sz- I agree was probably just lowering of sz threshold with uremia- no sz meds indicated   [name] A   01/15/2013,1:27 PM  LOS: 3 days

## 2013-01-15 NOTE — Consult Note (Signed)
Reason for Consult: seizure Referring Physician: Dr. Jomarie Longs  CC: seizure  HPI: [name] is an 79 y.o. male who was in the bathroom DOA and fell. Wife came in and he was lying on floor naked as he had just bathed. She did not see seizure, but did notice tongue bite.. Today, patient did have mental status change as noted by wife. He then had a witnessed seizure. He was given IV Ativan and Calcium gluconate. Postive for urinary incontinence. Stat CT Scan non-acute. Patient's family is at bedside and son feels that he seems to be intermittently confused, but is getting better. Normally he is not confused. No further seizure type activities other than the son stating that the patient does occassionally over the past 2 days has had right hand jerking.   On admission, patient felt to have seizure due to uremia and hypocalcemia.  Recent AVF (new) just infiltrated. Has not officially been able to start full HD. Plans on TTS..   The patients wife is thought to have some early dementia. They live alone. He does not drive.   Past Medical History  Diagnosis Date  . Hypertension   . Diabetes mellitus without complication   . Arthritis   . Renal disorder     chronic renal disease  . History of blood transfusion   . GI bleed   . History of blood transfusion     Past Surgical History  Procedure Laterality Date  . Esophagogastroduodenoscopy  10/03/2012    Procedure: ESOPHAGOGASTRODUODENOSCOPY (EGD);  Surgeon: Theda Belfast, MD;  Location: [facility] ENDOSCOPY;  Service: Endoscopy;  Laterality: N/A;  . Colonoscopy  10/04/2012    Procedure: COLONOSCOPY;  Surgeon: Theda Belfast, MD;  Location: [facility] ENDOSCOPY;  Service: Endoscopy;  Laterality: N/A;  . Colonoscopy with esophagogastroduodenoscopy (egd) Left 11/30/2012    Procedure: COLONOSCOPY WITH ESOPHAGOGASTRODUODENOSCOPY (EGD);  Surgeon: Theda Belfast, MD;  Location: [facility] ENDOSCOPY;  Service: Endoscopy;  Laterality: Left;    Family History  Problem Relation  Age of Onset  . Sudden death Mother   . Sudden death Father     Social History: no known tobacco, alcohol, illicit drug use.  No Known Allergies  Current Facility-Administered Medications  Medication Dose Route Frequency Provider Last Rate Last Dose  . 0.9 %  sodium chloride infusion  250 mL Intravenous PRN Maretta Bees, MD      . acetaminophen (TYLENOL) tablet 650 mg  650 mg Oral Q6H PRN Shanker Levora Dredge, MD      . albuterol (PROVENTIL) (5 MG/ML) 0.5% nebulizer solution 2.5 mg  2.5 mg Nebulization Q2H PRN Shanker Levora Dredge, MD      . allopurinol (ZYLOPRIM) tablet 100 mg  100 mg Oral Custom Shanker Levora Dredge, MD      . amLODipine (NORVASC) tablet 10 mg  10 mg Oral Daily Maretta Bees, MD   10 mg at 01/14/13 0924  . calcium carbonate (dosed in mg elemental calcium) suspension 500 mg of elemental calcium  500 mg of elemental calcium Oral Q6H PRN Dyke Maes, MD      . calcium carbonate (OS-CAL - dosed in mg of elemental calcium) tablet 1,000 mg of elemental calcium  2 tablet Oral TID WC Dyke Maes, MD   1,000 mg of elemental calcium at 01/15/13 1241  . calcium carbonate (TUMS - dosed in mg elemental calcium) chewable tablet 200 mg of elemental calcium  1 tablet Oral TID WC Shanker Levora Dredge, MD   200 mg  of elemental calcium at 01/15/13 1241  . camphor-menthol (SARNA) lotion 1 application  1 application Topical Q8H PRN Dyke Maes, MD   1 application at 01/14/13 1610   And  . hydrOXYzine (ATARAX/VISTARIL) tablet 25 mg  25 mg Oral Q8H PRN Dyke Maes, MD   25 mg at 01/14/13 0924  . cloNIDine (CATAPRES) tablet 0.1 mg  0.1 mg Oral Q4H PRN Dyke Maes, MD      . darbepoetin [facility]) injection 60 mcg  60 mcg Intravenous Q Fri-HD Dyke Maes, MD   60 mcg at 01/12/13 1836  . docusate sodium (ENEMEEZ) enema 283 mg  1 enema Rectal PRN Dyke Maes, MD      . Melene Muller ON 01/16/2013] doxercalciferol (HECTOROL) injection 3 mcg  3 mcg  Intravenous Q T,Th,Sa-HD Cecille Aver, MD      . feeding supplement (NEPRO CARB STEADY) liquid 237 mL  237 mL Oral TID PRN Dyke Maes, MD      . fentaNYL (SUBLIMAZE) 0.05 MG/ML injection           . gelatin adsorbable (GELFOAM/SURGIFOAM) 12-7 MM sponge 12-7 mm           . heparin 1000 UNIT/ML injection           . heparin injection 1,900 Units  20 Units/kg Dialysis PRN Cecille Aver, MD      . insulin aspart (novoLOG) injection 0-9 Units  0-9 Units Subcutaneous TID WC Maretta Bees, MD   3 Units at 01/14/13 1211  . LORazepam (ATIVAN) injection 1 mg  1 mg Intravenous Q1H PRN Zannie Cove, MD   1 mg at 01/15/13 1655  . midazolam (VERSED) 2 MG/2ML injection           . multivitamin (RENA-VIT) tablet 1 tablet  1 tablet Oral Q supper Dyke Maes, MD   1 tablet at 01/14/13 1740  . ondansetron (ZOFRAN) injection 4 mg  4 mg Intravenous Q6H PRN Shanker Levora Dredge, MD      . pantoprazole (PROTONIX) EC tablet 40 mg  40 mg Oral BID AC Shanker Levora Dredge, MD   40 mg at 01/15/13 1044  . sodium chloride (OCEAN) 0.65 % nasal spray 1 spray  1 spray Each Nare PRN Shanker Levora Dredge, MD      . sodium chloride 0.9 % injection 3 mL  3 mL Intravenous Q12H Maretta Bees, MD   3 mL at 01/15/13 1045  . sodium chloride 0.9 % injection 3 mL  3 mL Intravenous Q12H Maretta Bees, MD   3 mL at 01/14/13 2128  . sodium chloride 0.9 % injection 3 mL  3 mL Intravenous PRN Maretta Bees, MD      . valproate (DEPACON) 1,000 mg in dextrose 5 % 50 mL IVPB  1 g Intravenous Once Zannie Cove, MD      . zolpidem (AMBIEN) tablet 5 mg  5 mg Oral QHS PRN Dyke Maes, MD       ROS: History obtained from child  General ROS: negative for - chills, fatigue, fever, night sweats, weight gain or weight loss Psychological ROS: negative for - behavioral disorder, hallucinations, memory difficulties, mood swings or suicidal ideation Ophthalmic ROS: negative for - blurry vision, double  vision, eye pain or loss of vision ENT ROS: negative for - epistaxis, nasal discharge, oral lesions, sore throat, tinnitus or vertigo Allergy and Immunology ROS: negative for - hives or itchy/watery eyes Hematological and  Lymphatic ROS: negative for - bleeding problems, bruising or swollen lymph nodes Endocrine ROS: negative for - galactorrhea, hair pattern changes, polydipsia/polyuria or temperature intolerance Respiratory ROS: negative for - cough, hemoptysis, shortness of breath or wheezing Cardiovascular ROS: negative for - chest pain, dyspnea on exertion, edema or irregular heartbeat Gastrointestinal ROS: negative for - abdominal pain, diarrhea, hematemesis, nausea/vomiting or stool incontinence Genito-Urinary ROS: negative for - dysuria, hematuria, incontinence or urinary frequency/urgency Musculoskeletal ROS: negative for - joint swelling or muscular weakness Neurological ROS: as noted in HPI Dermatological ROS: negative for rash and skin lesion changes  Physical Examination: Blood pressure 166/67, pulse 109, temperature 101.6 F (38.7 C), temperature source Rectal, resp. rate 20, height 6' (1.829 m), weight 94.5 kg (208 lb 5.4 oz), SpO2 99.00%.  Neurologic Examination Mental Status: Alert, not oriented, right year, wrong month, age off a year.  Speech fluent without evidence of aphasia.  Able to follow 3 step commands without difficulty. Left outer eye bandaged due to fall on DOA. Cranial Nerves: II: visual fields grossly normal, pupils equal, round III,IV, VI: ptosis not present, extra-ocular motions intact bilaterally V,VII: smile symmetric, facial light touch sensation normal bilaterally VIII: hearing normal bilaterally IX,X: gag reflex present XI: trapezius strength/neck flexion strength normal bilaterally XII: tongue strength normal  Motor: Right : Upper extremity   5/5    Left:     Upper extremity   5/5  Lower extremity   5/5     Lower extremity   5/5 Tone and bulk:normal  tone throughout; no atrophy noted Sensory: Pinprick and light touch intact throughout, bilaterally Deep Tendon Reflexes: 2+, symmetric throughout Plantars: Right: downgoing   Left: downgoing Cerebellar: normal finger-to-nose, slow. Gait not tested due to patient safety concerns.   Results for orders placed during the hospital encounter of 01/12/13 (from the past 48 hour(s))  GLUCOSE, CAPILLARY     Status: Abnormal   Collection Time    01/13/13  9:34 PM      Result Value Range   Glucose-Capillary 219 (*) 70 - 99 mg/dL  GLUCOSE, CAPILLARY     Status: Abnormal   Collection Time    01/14/13  7:51 AM      Result Value Range   Glucose-Capillary 114 (*) 70 - 99 mg/dL  GLUCOSE, CAPILLARY     Status: Abnormal   Collection Time    01/14/13 11:39 AM      Result Value Range   Glucose-Capillary 231 (*) 70 - 99 mg/dL  GLUCOSE, CAPILLARY     Status: None   Collection Time    01/14/13  4:06 PM      Result Value Range   Glucose-Capillary 77  70 - 99 mg/dL  GLUCOSE, CAPILLARY     Status: Abnormal   Collection Time    01/14/13  8:42 PM      Result Value Range   Glucose-Capillary 266 (*) 70 - 99 mg/dL  GLUCOSE, CAPILLARY     Status: Abnormal   Collection Time    01/15/13  7:36 AM      Result Value Range   Glucose-Capillary 148 (*) 70 - 99 mg/dL  GLUCOSE, CAPILLARY     Status: Abnormal   Collection Time    01/15/13 11:49 AM      Result Value Range   Glucose-Capillary 213 (*) 70 - 99 mg/dL  GLUCOSE, CAPILLARY     Status: Abnormal   Collection Time    01/15/13  4:45 PM      Result Value  Range   Glucose-Capillary 247 (*) 70 - 99 mg/dL    No results found for this or any previous visit (from the past 240 hour(s)).  Ct Head Wo Contrast  01/15/2013  *RADIOLOGY REPORT*  Clinical Data: Altered mental status.  CT HEAD WITHOUT CONTRAST  Technique:  Contiguous axial images were obtained from the base of the skull through the vertex without contrast.  Comparison: 01/12/2013  Findings: Patient  motion degrades image quality.  The study is felt to be diagnostic. There is atrophy and chronic small vessel disease changes.  No definite acute infarction.  No hemorrhage or hydrocephalus.  No mass lesion or midline shift.  No acute calvarial abnormality.  IMPRESSION: Image quality degraded by patient motion.  No definite acute process.   Original Report Authenticated By: Charlett Nose, M.D.    Ir Fluoro Guide Cv Line Left  01/15/2013  *RADIOLOGY REPORT*  Indication: 79 year old with end-stage renal disease. Recently infiltrated right upper arm fistula.  The patient needs hemodialysis and a catheter.  PROCEDURE(S): FLUOROSCOPIC AND ULTRASOUND GUIDED PLACEMENT OF A TUNNELED DIAYSIS CATHETER  Physician:  Rachelle Hora. Lowella Dandy, MD  Medications: Ancef 2gm. Versed 2 mg, Fentanyl 100 mcg. A radiology nurse monitored the patient for moderate sedation.  As antibiotic prophylaxis, Ancef  was ordered pre-procedure and administered intravenously within one hour of incision.  Moderate sedation time: 38 minutes  Fluoroscopy time: 3 minutes and 8 seconds  Procedure:Informed consent was obtained for placement of a tunneled dialysis catheter.  The patient was placed supine on the interventional table.  Ultrasound confirmed a patent left internal jugularvein.  Ultrasound images were obtained for documentation. The left side of the neck was prepped and draped in a sterile fashion.  The left side of the neck was anesthetized with 1% lidocaine.  Maximal barrier sterile technique was utilized including caps, mask, sterile gowns, sterile gloves, sterile drape, hand hygiene and skin antiseptic.  A small incision was made with #11 blade scalpel.  A 21 gauge needle directed into the left internal jugular vein with ultrasound guidance.  A micropuncture dilator set was placed.  A 27 cm tip to cuff HemoSplit catheter was selected.  The skin below the left clavicle was anesthetized and a small incision was made with an #11 blade scalpel.  A  subcutaneous tunnel was formed to the vein dermatotomy site.  The catheter was brought through the tunnel.  The vein dermatotomy site was dilated to accommodate a peel-away sheath.  The catheter was placed through the peel-away sheath and directed into the central venous structures.  The tip of the catheter was placed in the upper right atrium with fluoroscopy.  Fluoroscopic images were obtained for documentation.  Both lumens were found to aspirate and flush well. The proper amount of heparin was flushed in both lumens.  The vein dermatotomy site was closed using a single layer of absorbable suture and Dermabond.  The catheter was secured to the skin using Prolene suture.  Findings:Catheter tip in the upper right atrium.  Complications: None  Impression:Successful placement of a left jugular tunneled dialysis catheter using ultrasound and fluoroscopic guidance.   Original Report Authenticated By: Richarda Overlie, M.D.    Ir US Guide Vasc Access Left  01/15/2013  *RADIOLOGY REPORT*  Indication: 79 year old with end-stage renal disease. Recently infiltrated right upper arm fistula.  The patient needs hemodialysis and a catheter.  PROCEDURE(S): FLUOROSCOPIC AND ULTRASOUND GUIDED PLACEMENT OF A TUNNELED DIAYSIS CATHETER  Physician:  Rachelle Hora. Lowella Dandy, MD  Medications: Ancef 2gm.  Versed 2 mg, Fentanyl 100 mcg. A radiology nurse monitored the patient for moderate sedation.  As antibiotic prophylaxis, Ancef  was ordered pre-procedure and administered intravenously within one hour of incision.  Moderate sedation time: 38 minutes  Fluoroscopy time: 3 minutes and 8 seconds  Procedure:Informed consent was obtained for placement of a tunneled dialysis catheter.  The patient was placed supine on the interventional table.  Ultrasound confirmed a patent left internal jugularvein.  Ultrasound images were obtained for documentation. The left side of the neck was prepped and draped in a sterile fashion.  The left side of the neck was  anesthetized with 1% lidocaine.  Maximal barrier sterile technique was utilized including caps, mask, sterile gowns, sterile gloves, sterile drape, hand hygiene and skin antiseptic.  A small incision was made with #11 blade scalpel.  A 21 gauge needle directed into the left internal jugular vein with ultrasound guidance.  A micropuncture dilator set was placed.  A 27 cm tip to cuff HemoSplit catheter was selected.  The skin below the left clavicle was anesthetized and a small incision was made with an #11 blade scalpel.  A subcutaneous tunnel was formed to the vein dermatotomy site.  The catheter was brought through the tunnel.  The vein dermatotomy site was dilated to accommodate a peel-away sheath.  The catheter was placed through the peel-away sheath and directed into the central venous structures.  The tip of the catheter was placed in the upper right atrium with fluoroscopy.  Fluoroscopic images were obtained for documentation.  Both lumens were found to aspirate and flush well. The proper amount of heparin was flushed in both lumens.  The vein dermatotomy site was closed using a single layer of absorbable suture and Dermabond.  The catheter was secured to the skin using Prolene suture.  Findings:Catheter tip in the upper right atrium.  Complications: None  Impression:Successful placement of a left jugular tunneled dialysis catheter using ultrasound and fluoroscopic guidance.   Original Report Authenticated By: Richarda Overlie, M.D.       Assessment/Plan: 79 years old male with HTN, DM,  ESRD ( no able to start full HD yet) but no prior seizure history who earlier today sustained an isolated, witnessed GTC and is now back to his baseline with a neuro-exam that is non focal and CT brain that showed no acute intracranial abnormality. Received IV ativan and 1 gram IV Depacon and hasn't had further seizures. Patient's seizure thought to be symptomatic of underlying uremia and hypocalcemia. Will complete seizure  work up with MRI brain and EEG and maintain him on depakote 500 mg BID pending the results of seizure work up. Valproic acid in am. Will continue to follow.   Wyatt Portela, MD [facility]. 01/15/2013, 6:16 PM

## 2013-01-15 NOTE — Procedures (Signed)
Placement of left IJ Perm Cath.   Tip in RA and ready to use.  No immediate complication.

## 2013-01-15 NOTE — Progress Notes (Signed)
Attempted to call report to 3300, charge nurse unable to take report at this time, will call me back immediately when she is able.

## 2013-01-15 NOTE — Progress Notes (Signed)
PT Cancellation Note  Patient Details Name: [name] MRN: [id number] DOB: 1931/09/08   Cancelled Treatment:    Reason Eval/Treat Not Completed: Other (comment) (pt states he feels "a little loopy" after procedure this am) Defer PT til tomorrow   Donnetta Hail 01/15/2013, 1:24 PM

## 2013-01-15 NOTE — Significant Event (Signed)
Rapid Response Event Note  Overview: Time Called: 1642 Arrival Time: 1646 Event Type: Neurologic  Initial Focused Assessment: Called to patient's bedside because patient with difficulty finding words.  Upon arrival patient alert, able to follow commands.  Unable to state age, stated correct month.  No extremity deficits.  Extremity jerking noted twice then patient with grandmal seizure.  1 mg ativan given IV.  Calcium Gluconate given IV.  Patient with snoring respirations during postictal stage.  Patient began to arouse during transport to radiology for head CT.  Post CT patient transferred to 3303 via bed with O2 and heart monitor.  RN at bedside updated on patient status.  Wife at bedside during event.  MD at bedside to assess patient.  Interventions:   Event Summary: Name of Physician Notified: Dr Jomarie Longs, Dr Lanice Schwab at bedside at    Name of Consulting Physician Notified: Dr Thad Ranger at 1700  Outcome: Transferred (Comment) [phone])  Event End Time: 1800  Marcellina Millin

## 2013-01-15 NOTE — Progress Notes (Signed)
I was notified by Dr. Kathrene Bongo about a change in mental status in pt. When I went in to check on the pt, wife was at bedside, and pt was only able to tell me his name. When I asked him the other orientation questions, pt answered with jibberish. Pt was also noted to have small jerky movements in his extremities. Rapid response RN, and MD notified. Another nurse was called to bedside, so that I could check on another pt. During my absence, the pt had a seizure. IV Ativan was given. Calcium gluconate infusion initiated. Vital signs taken. Orders received from MD for stat CT head. Rapid response RN transported pt to CT, and will transport pt to rm 3303 after completion of CT.

## 2013-01-15 NOTE — Progress Notes (Addendum)
[facility] PROGRESS NOTE  [name] JXB:[id number] DOB: 1932/05/12 DOA: 01/12/2013 PCP: Cecille Aver, MD  Assessment/Plan: Seizure witnessed 5/12 and possibly unwitnessed prior to admission -febrile today -suspect seizure due to hypocalcemia and uremia: metabolic in etiology -had symptoms of shakes and jerks for few days prior -started HD 5/9 but only partial treatment so far -IV calcium gluconate now -CT head 5/9 unremarkable, Repeat CT head now Will also need MRI if CT head unremarkable -Has not been dialyzed for 2 days due to access issues -ativan PRN -Load with Depakote, Neuro consult requested -transfer to SDU  Fever:  -check Blood Cx x2 -Monitor RUE AVF site  . Progressive CKD (chronic kidney disease) stage 5, GFR less than 15 ml/min  -now ESRD -started HD 5/9 -access issues, s/p HD catheter placement 5/12 -for HD today ASAP  . Anemia in chronic kidney disease  -Hb stable -aranesp per Renal  . Diabetes type 2, controlled  -Currently diet controlled, not on any oral hypoglycemic agents.  - SSI for now.   . Hypertension  -Continue with amlodipine.  -expect this to improve with HD  . Metabolic acidosis, increased anion gap  -Secondary to chronic kidney disease, improved, will resolve with HD -DC sodium bicarbonate    DVT Prophylaxis:  Prophylactic Heparin   Code Status:  Full Code       Consultants:  Renal Dr.Mattingly/[place]  Neurology  Procedures:  HD started 5/9  HPI/Subjective: Having jerks per wife and now with acute confusion, dysarthric  Objective: Filed Vitals:   01/15/13 0939 01/15/13 1016 01/15/13 1046 01/15/13 1146  BP: 162/68 169/65 163/62 161/59  Pulse: 82 87 88 91  Temp:  98.6 F (37 C)  97.2 F (36.2 C)  TempSrc:  Oral  Oral  Resp: 19 18 18 18   Height:      Weight:      SpO2: 100% 100% 99% 100%    Intake/Output Summary (Last 24 hours) at 01/15/13 1617 Last data filed at 01/15/13 1100  Gross per  24 hour  Intake    360 ml  Output   1050 ml  Net   -690 ml   Filed Weights   01/13/13 0645 01/13/13 1604 01/14/13 2038  Weight: 93 kg (205 lb 0.4 oz) 94.4 kg (208 lb 1.8 oz) 94.5 kg (208 lb 5.4 oz)    Exam:   General:  Awake, confused, repetitive speech, not making sense, jerky movements, laceration on forehead with sutures  HEENT: R eye subconjunctival hge  Cardiovascular: S1S2/RRR  Respiratory: CTAB  Abdomen: soft, NT, BS present  Ext: no edema c/c Neuro: confused, speech not fluent Motor: moves all extremities, strength 4/5 in all extremities DTR 1 plus b/l Plantars downgoing  Data Reviewed: Basic Metabolic Panel:  Recent Labs Lab 01/12/13 1230 01/12/13 1910 01/13/13 0205 01/13/13 0728  NA 139  --  141  --   K 5.0  --  4.2  --   CL 103  --  105  --   CO2 16*  --  21  --   GLUCOSE 173*  --  154*  --   BUN 146*  --  110*  --   CREATININE 10.77* 8.49* 9.28*  --   CALCIUM 6.3*  --  7.0*  --   PHOS  --   --   --  4.8*   Liver Function Tests:  Recent Labs Lab 01/13/13 0205  AST 14  ALT 8  ALKPHOS 57  BILITOT 0.4  PROT 6.8  ALBUMIN  3.7   No results found for this basename: LIPASE, AMYLASE,  in the last 168 hours No results found for this basename: AMMONIA,  in the last 168 hours CBC:  Recent Labs Lab 01/12/13 1230 01/12/13 1910 01/13/13 0205  WBC 7.7 6.8 4.8  NEUTROABS 6.7  --   --   HGB 10.7* 10.4* 10.0*  HCT 31.9* 29.6* 29.5*  MCV 93.5 92.2 93.1  PLT 134* 122* 118*   Cardiac Enzymes:  Recent Labs Lab 01/12/13 1910 01/13/13 0205 01/13/13 0728  TROPONINI <0.30 <0.30 <0.30   BNP (last 3 results) No results found for this basename: PROBNP,  in the last 8760 hours CBG:  Recent Labs Lab 01/14/13 1139 01/14/13 1606 01/14/13 2042 01/15/13 0736 01/15/13 1149  GLUCAP 231* 77 266* 148* 213*    No results found for this or any previous visit (from the past 240 hour(s)).   Studies: No results found.  Scheduled Meds: .  allopurinol  100 mg Oral Custom  . amLODipine  10 mg Oral Daily  . calcium carbonate  2 tablet Oral TID WC  . calcium carbonate  1 tablet Oral TID WC  . calcium gluconate  1 g Intravenous STAT  . darbepoetin (ARANESP) injection - DIALYSIS  60 mcg Intravenous Q Fri-HD  . [START ON 01/16/2013] doxercalciferol  3 mcg Intravenous Q T,Th,Sa-HD  . fentaNYL      . gelatin adsorbable      . heparin      . heparin  5,000 Units Subcutaneous Q8H  . insulin aspart  0-9 Units Subcutaneous TID WC  . midazolam      . multivitamin  1 tablet Oral Q supper  . pantoprazole  40 mg Oral BID AC  . sodium chloride  3 mL Intravenous Q12H  . sodium chloride  3 mL Intravenous Q12H   Continuous Infusions:   Principal Problem:   Syncope Active Problems:   Progressive CKD (chronic kidney disease) stage 5, GFR less than 15 ml/min   Hypertension   history of AVM (arteriovenous malformation) of colon with hemorrhage   Metabolic acidosis, increased anion gap   Anemia in chronic kidney disease   Diabetes type 2, controlled    Time spent:51min    [facility]  [facility] Pager [phone]. If 7PM-7AM, please contact night-coverage at [email or web address], password [facility] 01/15/2013, 4:17 PM  LOS: 3 days

## 2013-01-15 NOTE — Progress Notes (Signed)
Report given to nurse on 3300.  

## 2013-01-15 NOTE — Progress Notes (Signed)
Pt with witnessed seizure, rapid response at bedside. Pt given 1mg  IV ativan to IV in left forearm. Snoring respirations after seizure.

## 2013-01-15 NOTE — Progress Notes (Addendum)
Pt returned from IR with a small amount of bloody drainage to the L chest HD cath dressing. Within 3 minutes, the dressing was completely saturated with bright red blood. IR notified. IR RN came to bedside and placed pressure, and changed dressing. Dressing is currently clean, dry and intact. Will continue to monitor dressing as well as vital signs.

## 2013-01-16 ENCOUNTER — Inpatient Hospital Stay (HOSPITAL_COMMUNITY): Payer: Medicare Other

## 2013-01-16 DIAGNOSIS — W19XXXA Unspecified fall, initial encounter: Secondary | ICD-10-CM

## 2013-01-16 LAB — GLUCOSE, CAPILLARY
Glucose-Capillary: 137 mg/dL — ABNORMAL HIGH (ref 70–99)
Glucose-Capillary: 131 mg/dL — ABNORMAL HIGH (ref 70–99)

## 2013-01-16 MED ORDER — HEPARIN SODIUM (PORCINE) 1000 UNIT/ML IJ SOLN
4500.0000 [IU] | Freq: Once | INTRAMUSCULAR | Status: AC
  Administered 2013-01-16: 4500 [IU]
  Filled 2013-01-16: qty 4.5

## 2013-01-16 MED ORDER — INSULIN ASPART 100 UNIT/ML ~~LOC~~ SOLN
0.0000 [IU] | Freq: Three times a day (TID) | SUBCUTANEOUS | Status: DC
  Administered 2013-01-17: 9 [IU] via SUBCUTANEOUS
  Administered 2013-01-17: 2 [IU] via SUBCUTANEOUS
  Administered 2013-01-18: 3 [IU] via SUBCUTANEOUS

## 2013-01-16 MED ORDER — NAPROXEN 250 MG PO TABS
250.0000 mg | ORAL_TABLET | Freq: Two times a day (BID) | ORAL | Status: AC
  Administered 2013-01-16 – 2013-01-18 (×4): 250 mg via ORAL
  Filled 2013-01-16 (×4): qty 1

## 2013-01-16 NOTE — Progress Notes (Signed)
NEURO HOSPITALIST PROGRESS NOTE   SUBJECTIVE:                                                                                                                        Patient is alert and awake. Follows commands.  HE has no drowsiness or SE of Depakote.   OBJECTIVE:                                                                                                                           Vital signs in last 24 hours: Temp:  [97.2 F (36.2 C)-101.6 F (38.7 C)] 98.7 F (37.1 C) (05/13 0700) Pulse Rate:  [85-116] 94 (05/13 0300) Resp:  [15-20] 18 (05/13 0300) BP: (103-184)/(59-78) 166/72 mmHg (05/13 0955) SpO2:  [93 %-100 %] 100 % (05/13 0300) Weight:  [91.9 kg (202 lb 9.6 oz)-95.4 kg (210 lb 5.1 oz)] 91.9 kg (202 lb 9.6 oz) (05/13 0251)  Intake/Output from previous day: 05/12 0701 - 05/13 0700 In: 110 [I.V.:110] Out: 2000 [Urine:1000] Intake/Output this shift:   Nutritional status: Renal  Past Medical History  Diagnosis Date  . Hypertension   . Diabetes mellitus without complication   . Arthritis   . Renal disorder     chronic renal disease  . History of blood transfusion   . GI bleed   . History of blood transfusion      Neurologic Exam:  Mental Status: Alert, oriented, thought content appropriate.  Speech fluent without evidence of aphasia.  Able to follow 3 step commands without difficulty. Cranial Nerves: II: Visual fields grossly normal, pupils equal, round, reactive to light and accommodation III,IV, VI: ptosis not present, extra-ocular motions intact bilaterally V,VII: smile symmetric, facial light touch sensation normal bilaterally VIII: hearing normal bilaterally IX,X: gag reflex present XI: bilateral shoulder shrug XII: midline tongue extension Motor: Moving all extremities antigravity Tone and bulk:normal tone throughout; no atrophy noted Sensory: Pinprick and light touch intact throughout, bilaterally Deep Tendon  Reflexes: 2+ and symmetric throughout Plantars: Right: downgoing   Left: downgoing CV: pulses palpable throughout    Lab Results: No results found for this basename: cbc, bmp, coags, chol, tri, ldl, hga1c   Lipid Panel No results found for this basename: CHOL, TRIG, HDL,  CHOLHDL, VLDL, LDLCALC,  in the last 72 hours  Studies/Results: Ct Head Wo Contrast  01/15/2013  *RADIOLOGY REPORT*  Clinical Data: Altered mental status.  CT HEAD WITHOUT CONTRAST  Technique:  Contiguous axial images were obtained from the base of the skull through the vertex without contrast.  Comparison: 01/12/2013  Findings: Patient motion degrades image quality.  The study is felt to be diagnostic. There is atrophy and chronic small vessel disease changes.  No definite acute infarction.  No hemorrhage or hydrocephalus.  No mass lesion or midline shift.  No acute calvarial abnormality.  IMPRESSION: Image quality degraded by patient motion.  No definite acute process.   Original Report Authenticated By: Charlett Nose, M.D.    Ir Fluoro Guide Cv Line Left  01/15/2013  *RADIOLOGY REPORT*  Indication: 79 year old with end-stage renal disease. Recently infiltrated right upper arm fistula.  The patient needs hemodialysis and a catheter.  PROCEDURE(S): FLUOROSCOPIC AND ULTRASOUND GUIDED PLACEMENT OF A TUNNELED DIAYSIS CATHETER  Physician:  Rachelle Hora. Lowella Dandy, MD  Medications: Ancef 2gm. Versed 2 mg, Fentanyl 100 mcg. A radiology nurse monitored the patient for moderate sedation.  As antibiotic prophylaxis, Ancef  was ordered pre-procedure and administered intravenously within one hour of incision.  Moderate sedation time: 38 minutes  Fluoroscopy time: 3 minutes and 8 seconds  Procedure:Informed consent was obtained for placement of a tunneled dialysis catheter.  The patient was placed supine on the interventional table.  Ultrasound confirmed a patent left internal jugularvein.  Ultrasound images were obtained for documentation. The left side  of the neck was prepped and draped in a sterile fashion.  The left side of the neck was anesthetized with 1% lidocaine.  Maximal barrier sterile technique was utilized including caps, mask, sterile gowns, sterile gloves, sterile drape, hand hygiene and skin antiseptic.  A small incision was made with #11 blade scalpel.  A 21 gauge needle directed into the left internal jugular vein with ultrasound guidance.  A micropuncture dilator set was placed.  A 27 cm tip to cuff HemoSplit catheter was selected.  The skin below the left clavicle was anesthetized and a small incision was made with an #11 blade scalpel.  A subcutaneous tunnel was formed to the vein dermatotomy site.  The catheter was brought through the tunnel.  The vein dermatotomy site was dilated to accommodate a peel-away sheath.  The catheter was placed through the peel-away sheath and directed into the central venous structures.  The tip of the catheter was placed in the upper right atrium with fluoroscopy.  Fluoroscopic images were obtained for documentation.  Both lumens were found to aspirate and flush well. The proper amount of heparin was flushed in both lumens.  The vein dermatotomy site was closed using a single layer of absorbable suture and Dermabond.  The catheter was secured to the skin using Prolene suture.  Findings:Catheter tip in the upper right atrium.  Complications: None  Impression:Successful placement of a left jugular tunneled dialysis catheter using ultrasound and fluoroscopic guidance.   Original Report Authenticated By: Richarda Overlie, M.D.    Ir US Guide Vasc Access Left  01/15/2013  *RADIOLOGY REPORT*  Indication: 79 year old with end-stage renal disease. Recently infiltrated right upper arm fistula.  The patient needs hemodialysis and a catheter.  PROCEDURE(S): FLUOROSCOPIC AND ULTRASOUND GUIDED PLACEMENT OF A TUNNELED DIAYSIS CATHETER  Physician:  Rachelle Hora. Lowella Dandy, MD  Medications: Ancef 2gm. Versed 2 mg, Fentanyl 100 mcg. A radiology  nurse monitored the patient for moderate sedation.  As  antibiotic prophylaxis, Ancef  was ordered pre-procedure and administered intravenously within one hour of incision.  Moderate sedation time: 38 minutes  Fluoroscopy time: 3 minutes and 8 seconds  Procedure:Informed consent was obtained for placement of a tunneled dialysis catheter.  The patient was placed supine on the interventional table.  Ultrasound confirmed a patent left internal jugularvein.  Ultrasound images were obtained for documentation. The left side of the neck was prepped and draped in a sterile fashion.  The left side of the neck was anesthetized with 1% lidocaine.  Maximal barrier sterile technique was utilized including caps, mask, sterile gowns, sterile gloves, sterile drape, hand hygiene and skin antiseptic.  A small incision was made with #11 blade scalpel.  A 21 gauge needle directed into the left internal jugular vein with ultrasound guidance.  A micropuncture dilator set was placed.  A 27 cm tip to cuff HemoSplit catheter was selected.  The skin below the left clavicle was anesthetized and a small incision was made with an #11 blade scalpel.  A subcutaneous tunnel was formed to the vein dermatotomy site.  The catheter was brought through the tunnel.  The vein dermatotomy site was dilated to accommodate a peel-away sheath.  The catheter was placed through the peel-away sheath and directed into the central venous structures.  The tip of the catheter was placed in the upper right atrium with fluoroscopy.  Fluoroscopic images were obtained for documentation.  Both lumens were found to aspirate and flush well. The proper amount of heparin was flushed in both lumens.  The vein dermatotomy site was closed using a single layer of absorbable suture and Dermabond.  The catheter was secured to the skin using Prolene suture.  Findings:Catheter tip in the upper right atrium.  Complications: None  Impression:Successful placement of a left jugular  tunneled dialysis catheter using ultrasound and fluoroscopic guidance.   Original Report Authenticated By: Richarda Overlie, M.D.     MEDICATIONS                                                                                                                        Scheduled: . allopurinol  100 mg Oral Custom  . amLODipine  10 mg Oral Daily  . calcium carbonate  2 tablet Oral TID WC  . calcium carbonate  1 tablet Oral TID WC  . darbepoetin (ARANESP) injection - DIALYSIS  60 mcg Intravenous Q Fri-HD  . doxercalciferol  3 mcg Intravenous Q T,Th,Sa-HD  . insulin aspart  0-9 Units Subcutaneous TID WC  . multivitamin  1 tablet Oral Q supper  . pantoprazole  40 mg Oral BID AC  . sodium chloride  3 mL Intravenous Q12H  . sodium chloride  3 mL Intravenous Q12H  . valproate sodium  1 g Intravenous Once  . valproic acid  500 mg Oral BID    ASSESSMENT/PLAN:  79 YO male with GTC seizure. He currently is back to his baseline and tolerating Depakote well. Currently on 500 mg Depakote BID with level of 21.8.  Seizures are likely secondary to hypocalcemia along with multiple other metabolic abnormalities but due 2 days of right arm jerking would recommend continue Depakote.  Duration of time needed to remain on Depakote will be addressed by out patient neurology/neurology F/U. MRI PENDING.   Recommend: 1) Continue Depakote 500 mg BID 2) Depakote level in AM 3) MRI pending.    Assessment and plan discussed with with attending physician and they are in agreement.    Felicie Morn PA-C [facility] [phone]  01/16/2013, 11:35 AM

## 2013-01-16 NOTE — Progress Notes (Signed)
Rehab Admissions Coordinator Note:  Patient was screened by Clois Dupes for appropriateness for an Inpatient Acute Rehab Consult.  At this time, we are recommending Skilled Nursing Facility. [employer] medicare will not approve an inpt rehab admission for this diagnosis.  Clois Dupes 01/16/2013, 2:52 PM  I can be reached at [phone].

## 2013-01-16 NOTE — Progress Notes (Signed)
Subjective:  Events noted- became acutely confused late yest then had sz with a fever- HCT neg- cultures drawn but no abx- got HD early this AM.  Seems better this AM- c/o hand hurting from being stuck Objective Vital signs in last 24 hours: Filed Vitals:   01/16/13 0245 01/16/13 0251 01/16/13 0300 01/16/13 0700  BP: 165/62 174/68 176/69   Pulse: 91 89 94   Temp:  99.1 F (37.3 C)  98.7 F (37.1 C)  TempSrc:  Oral  Oral  Resp: 17 17 18    Height:      Weight:  91.9 kg (202 lb 9.6 oz)    SpO2: 99% 98% 100%    Weight change: 0.9 kg (1 lb 15.7 oz)  Intake/Output Summary (Last 24 hours) at 01/16/13 0916 Last data filed at 01/16/13 0600  Gross per 24 hour  Intake    110 ml  Output   2000 ml  Net  -1890 ml   Labs: Basic Metabolic Panel:  Recent Labs Lab 01/12/13 1230 01/12/13 1910 01/13/13 0205 01/13/13 0728 01/15/13 1900  NA 139  --  141  --  138  K 5.0  --  4.2  --  4.9  CL 103  --  105  --  102  CO2 16*  --  21  --  18*  GLUCOSE 173*  --  154*  --  242*  BUN 146*  --  110*  --  116*  CREATININE 10.77* 8.49* 9.28*  --  10.92*  CALCIUM 6.3*  --  7.0*  --  7.1*  PHOS  --   --   --  4.8*  --    Liver Function Tests:  Recent Labs Lab 01/13/13 0205  AST 14  ALT 8  ALKPHOS 57  BILITOT 0.4  PROT 6.8  ALBUMIN 3.7   No results found for this basename: LIPASE, AMYLASE,  in the last 168 hours No results found for this basename: AMMONIA,  in the last 168 hours CBC:  Recent Labs Lab 01/12/13 1230 01/12/13 1910 01/13/13 0205 01/15/13 1900  WBC 7.7 6.8 4.8 10.0  NEUTROABS 6.7  --   --   --   HGB 10.7* 10.4* 10.0* 9.4*  HCT 31.9* 29.6* 29.5* 27.3*  MCV 93.5 92.2 93.1 93.2  PLT 134* 122* 118* 122*   Cardiac Enzymes:  Recent Labs Lab 01/12/13 1910 01/13/13 0205 01/13/13 0728  TROPONINI <0.30 <0.30 <0.30   CBG:  Recent Labs Lab 01/15/13 1645 01/15/13 1851 01/15/13 1940 01/15/13 2323 01/16/13 0342  GLUCAP 247* 218* 211* 137* 131*    Iron Studies:  No results found for this basename: IRON, TIBC, TRANSFERRIN, FERRITIN,  in the last 72 hours Studies/Results: Ct Head Wo Contrast  01/15/2013  *RADIOLOGY REPORT*  Clinical Data: Altered mental status.  CT HEAD WITHOUT CONTRAST  Technique:  Contiguous axial images were obtained from the base of the skull through the vertex without contrast.  Comparison: 01/12/2013  Findings: Patient motion degrades image quality.  The study is felt to be diagnostic. There is atrophy and chronic small vessel disease changes.  No definite acute infarction.  No hemorrhage or hydrocephalus.  No mass lesion or midline shift.  No acute calvarial abnormality.  IMPRESSION: Image quality degraded by patient motion.  No definite acute process.   Original Report Authenticated By: Charlett Nose, M.D.    Ir Fluoro Guide Cv Line Left  01/15/2013  *RADIOLOGY REPORT*  Indication: 79 year old with end-stage renal disease. Recently infiltrated right upper arm  fistula.  The patient needs hemodialysis and a catheter.  PROCEDURE(S): FLUOROSCOPIC AND ULTRASOUND GUIDED PLACEMENT OF A TUNNELED DIAYSIS CATHETER  Physician:  Rachelle Hora. Lowella Dandy, MD  Medications: Ancef 2gm. Versed 2 mg, Fentanyl 100 mcg. A radiology nurse monitored the patient for moderate sedation.  As antibiotic prophylaxis, Ancef  was ordered pre-procedure and administered intravenously within one hour of incision.  Moderate sedation time: 38 minutes  Fluoroscopy time: 3 minutes and 8 seconds  Procedure:Informed consent was obtained for placement of a tunneled dialysis catheter.  The patient was placed supine on the interventional table.  Ultrasound confirmed a patent left internal jugularvein.  Ultrasound images were obtained for documentation. The left side of the neck was prepped and draped in a sterile fashion.  The left side of the neck was anesthetized with 1% lidocaine.  Maximal barrier sterile technique was utilized including caps, mask, sterile gowns, sterile gloves, sterile drape,  hand hygiene and skin antiseptic.  A small incision was made with #11 blade scalpel.  A 21 gauge needle directed into the left internal jugular vein with ultrasound guidance.  A micropuncture dilator set was placed.  A 27 cm tip to cuff HemoSplit catheter was selected.  The skin below the left clavicle was anesthetized and a small incision was made with an #11 blade scalpel.  A subcutaneous tunnel was formed to the vein dermatotomy site.  The catheter was brought through the tunnel.  The vein dermatotomy site was dilated to accommodate a peel-away sheath.  The catheter was placed through the peel-away sheath and directed into the central venous structures.  The tip of the catheter was placed in the upper right atrium with fluoroscopy.  Fluoroscopic images were obtained for documentation.  Both lumens were found to aspirate and flush well. The proper amount of heparin was flushed in both lumens.  The vein dermatotomy site was closed using a single layer of absorbable suture and Dermabond.  The catheter was secured to the skin using Prolene suture.  Findings:Catheter tip in the upper right atrium.  Complications: None  Impression:Successful placement of a left jugular tunneled dialysis catheter using ultrasound and fluoroscopic guidance.   Original Report Authenticated By: Richarda Overlie, M.D.    Ir US Guide Vasc Access Left  01/15/2013  *RADIOLOGY REPORT*  Indication: 80 year old with end-stage renal disease. Recently infiltrated right upper arm fistula.  The patient needs hemodialysis and a catheter.  PROCEDURE(S): FLUOROSCOPIC AND ULTRASOUND GUIDED PLACEMENT OF A TUNNELED DIAYSIS CATHETER  Physician:  Rachelle Hora. Lowella Dandy, MD  Medications: Ancef 2gm. Versed 2 mg, Fentanyl 100 mcg. A radiology nurse monitored the patient for moderate sedation.  As antibiotic prophylaxis, Ancef  was ordered pre-procedure and administered intravenously within one hour of incision.  Moderate sedation time: 38 minutes  Fluoroscopy time: 3 minutes  and 8 seconds  Procedure:Informed consent was obtained for placement of a tunneled dialysis catheter.  The patient was placed supine on the interventional table.  Ultrasound confirmed a patent left internal jugularvein.  Ultrasound images were obtained for documentation. The left side of the neck was prepped and draped in a sterile fashion.  The left side of the neck was anesthetized with 1% lidocaine.  Maximal barrier sterile technique was utilized including caps, mask, sterile gowns, sterile gloves, sterile drape, hand hygiene and skin antiseptic.  A small incision was made with #11 blade scalpel.  A 21 gauge needle directed into the left internal jugular vein with ultrasound guidance.  A micropuncture dilator set was placed.  A  27 cm tip to cuff HemoSplit catheter was selected.  The skin below the left clavicle was anesthetized and a small incision was made with an #11 blade scalpel.  A subcutaneous tunnel was formed to the vein dermatotomy site.  The catheter was brought through the tunnel.  The vein dermatotomy site was dilated to accommodate a peel-away sheath.  The catheter was placed through the peel-away sheath and directed into the central venous structures.  The tip of the catheter was placed in the upper right atrium with fluoroscopy.  Fluoroscopic images were obtained for documentation.  Both lumens were found to aspirate and flush well. The proper amount of heparin was flushed in both lumens.  The vein dermatotomy site was closed using a single layer of absorbable suture and Dermabond.  The catheter was secured to the skin using Prolene suture.  Findings:Catheter tip in the upper right atrium.  Complications: None  Impression:Successful placement of a left jugular tunneled dialysis catheter using ultrasound and fluoroscopic guidance.   Original Report Authenticated By: Richarda Overlie, M.D.    Medications: Infusions:    Scheduled Medications: . allopurinol  100 mg Oral Custom  . amLODipine  10 mg  Oral Daily  . calcium carbonate  2 tablet Oral TID WC  . calcium carbonate  1 tablet Oral TID WC  . darbepoetin (ARANESP) injection - DIALYSIS  60 mcg Intravenous Q Fri-HD  . doxercalciferol  3 mcg Intravenous Q T,Th,Sa-HD  . insulin aspart  0-9 Units Subcutaneous Q4H  . multivitamin  1 tablet Oral Q supper  . pantoprazole  40 mg Oral BID AC  . sodium chloride  3 mL Intravenous Q12H  . sodium chloride  3 mL Intravenous Q12H  . valproate sodium  1 g Intravenous Once  . valproic acid  500 mg Oral BID    have reviewed scheduled and prn medications.  Physical Exam: General: NAD, wound over left eye- more alert Heart: RRR Lungs: mostly clear Abdomen: soft, non tender Extremities: minimal edema Dialysis Access: right upper arm avf with big infiltration- good thrill, new left sided PC    Assessment/ Plan: Pt is a 79 y.o. yo male who was admitted on 01/12/2013 with  sz and hypocalcemia, now is new start to HD  Assessment/Plan: 1. Renal- new start to HD.  Got a little bit of treatment on 5/9 but then AVF infiltrated - now s/p PC on 5/12 so AVF can rest.  Received HD early this AM, will plan for another treatment tomorrow (Wed) and another on Thursday.  Has been set up TTS at [facility] as OP but timing of discharge could be complicated because they do not usually accept new pts to the unit on Saturday ? 2. HTN/volume- reasonable norvasc 10, will follow- may be able to decrease meds as HD gets going 3. Anemia- was on procrit- now on aranesp here 4. Secondary hyperparathyroidism- on TUMS for binding- recent PTH pending- on 3/31 was 672- will give hectorol with HD.  Continue renavite 5. sz- I agree was probably just lowering of sz threshold with uremia but now on valproic acid- no chronic  sz meds indicated ? Now per neuro sz w/up in process 6. Fever- per primary, no further fevers, no antibiotics, cultures pending  Eldean Klatt A   01/16/2013,9:16 AM  LOS: 4 days

## 2013-01-16 NOTE — Progress Notes (Signed)
[facility] PROGRESS NOTE  [name] AVW:[id number] DOB: 1932/01/11 DOA: 01/12/2013 PCP: Cecille Aver, MD Brief Narrative [name] is a 79 y.o. male with a history of CKD5, diabetes, hypertension, recent history of GI bleeding from AVMs presented to ER after a syncopal spell with AMS.His spouse reported  while patient was in the bathroom he sustained a syncopal event. His wife was downstairs, she heard him fall and ran upstairs, she found him on the bathroom floor wedged between the wall and the commode. He was apparently awake but lethargic and somewhat confused. His suspected seizure was suspected to be from CKD with Uremia/hypocalcemia, he had a partial HD treatment on admission and was clinically improved, further HD treatments got postponed due to access and 5/12 after HD catheter placement in afternoon he had acute MS changes followed by a seizure. Stat CT head negative, Seen By Neuro, loaded with Depakote, EEG/MRI pending Transferred to SDU following seizure Then had full HD treatment early this am   Assessment/Plan: Seizure witnessed 5/12 and possibly unwitnessed prior to admission -suspect seizure due to hypocalcemia and uremia: metabolic in etiology -had symptoms of shakes and jerks for few days prior to admission -started HD 5/9 but only partial treatment, full HD early this am -given IV calcium gluconate 5/12 -CT head 5/9 and 5/12 unremarkable -MRI/EEG pending -Loaded with Depakote 5/12 and started on maintenance -Neuro following, greatly appreciate consult   Fever: on 5/12 after seizure -afebrile since -FU Blood Cx x2 -Monitor RUE AVF site  . Progressive CKD (chronic kidney disease) stage 5, GFR less than 15 ml/min  -now ESRD -started HD 5/9 -access issues, s/p HD catheter placement 5/12 and HD resumed 5/13  . Anemia in chronic kidney disease  -Hb stable -aranesp per Renal  . Diabetes type 2, controlled  -Currently diet controlled, not on any oral  hypoglycemic agents.  - SSI for now.   . Hypertension  -Continue with amlodipine.  -expect this to improve with HD  . Metabolic acidosis, increased anion gap  -Secondary to chronic kidney disease, improved, will resolve with HD -DC sodium bicarbonate    DVT Prophylaxis:  Prophylactic Heparin   Code Status:  Full Code     Consultants:  Renal Dr.Mattingly/[place]  Neurology  Procedures:  HD started 5/9  HD catheter 5/12  HPI/Subjective: Mentation improved, wanting to eat, no more seizures/jerks Getting EEG done now  Objective: Filed Vitals:   01/16/13 0245 01/16/13 0251 01/16/13 0300 01/16/13 0700  BP: 165/62 174/68 176/69   Pulse: 91 89 94   Temp:  99.1 F (37.3 C)  98.7 F (37.1 C)  TempSrc:  Oral  Oral  Resp: 17 17 18    Height:      Weight:  91.9 kg (202 lb 9.6 oz)    SpO2: 99% 98% 100%     Intake/Output Summary (Last 24 hours) at 01/16/13 0917 Last data filed at 01/16/13 0600  Gross per 24 hour  Intake    110 ml  Output   2000 ml  Net  -1890 ml   Filed Weights   01/14/13 2038 01/16/13 0010 01/16/13 0251  Weight: 94.5 kg (208 lb 5.4 oz) 95.4 kg (210 lb 5.1 oz) 91.9 kg (202 lb 9.6 oz)    Exam:   General:  Awake, alert, oriented to self, place and partly to time, laceration on forehead with sutures  HEENT: R eye subconjunctival hge  Cardiovascular: S1S2/RRR  Respiratory: CTAB  Abdomen: soft, NT, BS present  Ext: no edema c/c  Neuro: higher function significantly improved, non focal exam  Data Reviewed: Basic Metabolic Panel:  Recent Labs Lab 01/12/13 1230 01/12/13 1910 01/13/13 0205 01/13/13 0728 01/15/13 1900  NA 139  --  141  --  138  K 5.0  --  4.2  --  4.9  CL 103  --  105  --  102  CO2 16*  --  21  --  18*  GLUCOSE 173*  --  154*  --  242*  BUN 146*  --  110*  --  116*  CREATININE 10.77* 8.49* 9.28*  --  10.92*  CALCIUM 6.3*  --  7.0*  --  7.1*  PHOS  --   --   --  4.8*  --    Liver Function Tests:  Recent  Labs Lab 01/13/13 0205  AST 14  ALT 8  ALKPHOS 57  BILITOT 0.4  PROT 6.8  ALBUMIN 3.7   No results found for this basename: LIPASE, AMYLASE,  in the last 168 hours No results found for this basename: AMMONIA,  in the last 168 hours CBC:  Recent Labs Lab 01/12/13 1230 01/12/13 1910 01/13/13 0205 01/15/13 1900  WBC 7.7 6.8 4.8 10.0  NEUTROABS 6.7  --   --   --   HGB 10.7* 10.4* 10.0* 9.4*  HCT 31.9* 29.6* 29.5* 27.3*  MCV 93.5 92.2 93.1 93.2  PLT 134* 122* 118* 122*   Cardiac Enzymes:  Recent Labs Lab 01/12/13 1910 01/13/13 0205 01/13/13 0728  TROPONINI <0.30 <0.30 <0.30   BNP (last 3 results) No results found for this basename: PROBNP,  in the last 8760 hours CBG:  Recent Labs Lab 01/15/13 1645 01/15/13 1851 01/15/13 1940 01/15/13 2323 01/16/13 0342  GLUCAP 247* 218* 211* 137* 131*    No results found for this or any previous visit (from the past 240 hour(s)).   Studies: Ct Head Wo Contrast  01/15/2013  *RADIOLOGY REPORT*  Clinical Data: Altered mental status.  CT HEAD WITHOUT CONTRAST  Technique:  Contiguous axial images were obtained from the base of the skull through the vertex without contrast.  Comparison: 01/12/2013  Findings: Patient motion degrades image quality.  The study is felt to be diagnostic. There is atrophy and chronic small vessel disease changes.  No definite acute infarction.  No hemorrhage or hydrocephalus.  No mass lesion or midline shift.  No acute calvarial abnormality.  IMPRESSION: Image quality degraded by patient motion.  No definite acute process.   Original Report Authenticated By: Charlett Nose, M.D.    Ir Fluoro Guide Cv Line Left  01/15/2013  *RADIOLOGY REPORT*  Indication: 79 year old with end-stage renal disease. Recently infiltrated right upper arm fistula.  The patient needs hemodialysis and a catheter.  PROCEDURE(S): FLUOROSCOPIC AND ULTRASOUND GUIDED PLACEMENT OF A TUNNELED DIAYSIS CATHETER  Physician:  Rachelle Hora. Lowella Dandy, MD   Medications: Ancef 2gm. Versed 2 mg, Fentanyl 100 mcg. A radiology nurse monitored the patient for moderate sedation.  As antibiotic prophylaxis, Ancef  was ordered pre-procedure and administered intravenously within one hour of incision.  Moderate sedation time: 38 minutes  Fluoroscopy time: 3 minutes and 8 seconds  Procedure:Informed consent was obtained for placement of a tunneled dialysis catheter.  The patient was placed supine on the interventional table.  Ultrasound confirmed a patent left internal jugularvein.  Ultrasound images were obtained for documentation. The left side of the neck was prepped and draped in a sterile fashion.  The left side of the neck was anesthetized with 1%  lidocaine.  Maximal barrier sterile technique was utilized including caps, mask, sterile gowns, sterile gloves, sterile drape, hand hygiene and skin antiseptic.  A small incision was made with #11 blade scalpel.  A 21 gauge needle directed into the left internal jugular vein with ultrasound guidance.  A micropuncture dilator set was placed.  A 27 cm tip to cuff HemoSplit catheter was selected.  The skin below the left clavicle was anesthetized and a small incision was made with an #11 blade scalpel.  A subcutaneous tunnel was formed to the vein dermatotomy site.  The catheter was brought through the tunnel.  The vein dermatotomy site was dilated to accommodate a peel-away sheath.  The catheter was placed through the peel-away sheath and directed into the central venous structures.  The tip of the catheter was placed in the upper right atrium with fluoroscopy.  Fluoroscopic images were obtained for documentation.  Both lumens were found to aspirate and flush well. The proper amount of heparin was flushed in both lumens.  The vein dermatotomy site was closed using a single layer of absorbable suture and Dermabond.  The catheter was secured to the skin using Prolene suture.  Findings:Catheter tip in the upper right atrium.   Complications: None  Impression:Successful placement of a left jugular tunneled dialysis catheter using ultrasound and fluoroscopic guidance.   Original Report Authenticated By: Richarda Overlie, M.D.    Ir US Guide Vasc Access Left  01/15/2013  *RADIOLOGY REPORT*  Indication: 79 year old with end-stage renal disease. Recently infiltrated right upper arm fistula.  The patient needs hemodialysis and a catheter.  PROCEDURE(S): FLUOROSCOPIC AND ULTRASOUND GUIDED PLACEMENT OF A TUNNELED DIAYSIS CATHETER  Physician:  Rachelle Hora. Lowella Dandy, MD  Medications: Ancef 2gm. Versed 2 mg, Fentanyl 100 mcg. A radiology nurse monitored the patient for moderate sedation.  As antibiotic prophylaxis, Ancef  was ordered pre-procedure and administered intravenously within one hour of incision.  Moderate sedation time: 38 minutes  Fluoroscopy time: 3 minutes and 8 seconds  Procedure:Informed consent was obtained for placement of a tunneled dialysis catheter.  The patient was placed supine on the interventional table.  Ultrasound confirmed a patent left internal jugularvein.  Ultrasound images were obtained for documentation. The left side of the neck was prepped and draped in a sterile fashion.  The left side of the neck was anesthetized with 1% lidocaine.  Maximal barrier sterile technique was utilized including caps, mask, sterile gowns, sterile gloves, sterile drape, hand hygiene and skin antiseptic.  A small incision was made with #11 blade scalpel.  A 21 gauge needle directed into the left internal jugular vein with ultrasound guidance.  A micropuncture dilator set was placed.  A 27 cm tip to cuff HemoSplit catheter was selected.  The skin below the left clavicle was anesthetized and a small incision was made with an #11 blade scalpel.  A subcutaneous tunnel was formed to the vein dermatotomy site.  The catheter was brought through the tunnel.  The vein dermatotomy site was dilated to accommodate a peel-away sheath.  The catheter was placed  through the peel-away sheath and directed into the central venous structures.  The tip of the catheter was placed in the upper right atrium with fluoroscopy.  Fluoroscopic images were obtained for documentation.  Both lumens were found to aspirate and flush well. The proper amount of heparin was flushed in both lumens.  The vein dermatotomy site was closed using a single layer of absorbable suture and Dermabond.  The catheter was secured to  the skin using Prolene suture.  Findings:Catheter tip in the upper right atrium.  Complications: None  Impression:Successful placement of a left jugular tunneled dialysis catheter using ultrasound and fluoroscopic guidance.   Original Report Authenticated By: Richarda Overlie, M.D.     Scheduled Meds: . allopurinol  100 mg Oral Custom  . amLODipine  10 mg Oral Daily  . calcium carbonate  2 tablet Oral TID WC  . calcium carbonate  1 tablet Oral TID WC  . darbepoetin (ARANESP) injection - DIALYSIS  60 mcg Intravenous Q Fri-HD  . doxercalciferol  3 mcg Intravenous Q T,Th,Sa-HD  . insulin aspart  0-9 Units Subcutaneous Q4H  . multivitamin  1 tablet Oral Q supper  . pantoprazole  40 mg Oral BID AC  . sodium chloride  3 mL Intravenous Q12H  . sodium chloride  3 mL Intravenous Q12H  . valproate sodium  1 g Intravenous Once  . valproic acid  500 mg Oral BID   Continuous Infusions:   Principal Problem:   Syncope Active Problems:   Progressive CKD (chronic kidney disease) stage 5, GFR less than 15 ml/min   Hypertension   history of AVM (arteriovenous malformation) of colon with hemorrhage   Metabolic acidosis, increased anion gap   Anemia in chronic kidney disease   Diabetes type 2, controlled   Seizures    Time spent:58min    [facility]  [facility] Pager [phone]. If 7PM-7AM, please contact night-coverage at [email or web address], password [facility] 01/16/2013, 9:17 AM  LOS: 4 days

## 2013-01-16 NOTE — Progress Notes (Signed)
Hemodialysis Tx done without problems using L IJ catheter.

## 2013-01-16 NOTE — Progress Notes (Signed)
Physical Therapy Treatment Patient Details Name: [name] MRN: [id number] DOB: 01-Jul-1932 Today's Date: 01/16/2013 Time: 7829-5621 PT Time Calculation (min): 24 min  PT Assessment / Plan / Recommendation Comments on Treatment Session  Patient is a 79 y/o male s/p fall with seizure who presents today limited by hand (wrist and finger) pain and stiffness.  Unable to push up from bed requiring increased assist to stand today.  May need inpatient rehab at [facility] as wife with recent surgery and may not be able to assist pt much physically.    Follow Up Recommendations  CIR     Does the patient have the potential to tolerate intense rehabilitation   yes  Barriers to Discharge        Equipment Recommendations    Rolling Walker   Recommendations for Other Services    Frequency Min 3X/week   Plan Discharge plan needs to be updated    Precautions / Restrictions Precautions Precautions: Fall   Pertinent Vitals/Pain C/o pain in hands (wrists and fingers worse with movement,) RN aware    Mobility  Bed Mobility Supine to Sit: 3: Mod assist Details for Bed Mobility Assistance: assist to lift trunk and scoot out Transfers Sit to Stand: 2: Max assist;From bed Stand to Sit: 3: Mod assist;To chair/3-in-1 Details for Transfer Assistance: difficulty standing due to unable to push with hands with soreness and stiff ness throughout Ambulation/Gait Ambulation/Gait Assistance: 4: Min assist;3: Mod assist Ambulation Distance (Feet): 130 Feet Assistive device: Rolling walker Ambulation/Gait Assistance Details: unable to grip walker, keeps hips flexed throughout Gait Pattern: Trunk flexed;Step-through pattern;Shuffle;Decreased hip/knee flexion - left;Decreased hip/knee flexion - right Gait velocity: slow pace    Exercises General Exercises - Upper Extremity Wrist Flexion: AAROM;Both;5 reps;Seated Wrist Extension: AAROM;Both;5 reps;Seated Digit Composite Flexion: AAROM;Both;5  reps;Seated General Exercises - Lower Extremity Ankle Circles/Pumps: AROM;10 reps;Both;Seated Long Arc Quad: AROM;Both;10 reps;Seated Hip Flexion/Marching: AROM;10 reps;Both;Seated     PT Goals Acute Rehab PT Goals Pt will go Supine/Side to Sit: Independently PT Goal: Supine/Side to Sit - Progress: Progressing toward goal Pt will go Sit to Stand: with modified independence PT Goal: Sit to Stand - Progress: Not progressing Pt will go Stand to Sit: with modified independence PT Goal: Stand to Sit - Progress: Progressing toward goal Pt will Ambulate: 51 - 150 feet;with least restrictive assistive device PT Goal: Ambulate - Progress: Progressing toward goal  Visit Information  Last PT Received On: 01/16/13    Subjective Data  Subjective: Wrists sore from all the sticks   Cognition  Cognition Arousal/Alertness: Awake/alert Behavior During Therapy: WFL for tasks assessed/performed Overall Cognitive Status: Within Functional Limits for tasks assessed    Balance  Static Standing Balance Static Standing - Balance Support: Bilateral upper extremity supported Static Standing - Level of Assistance: 4: Min assist  End of Session PT - End of Session Equipment Utilized During Treatment: Gait belt Activity Tolerance: Patient limited by pain Patient left: in chair;with family/visitor present;with call bell/phone within reach Nurse Communication: Mobility status;Patient requests pain meds   GP     [name],CYNDI 01/16/2013, 2:27 PM [place], [place] [phone] 01/16/2013

## 2013-01-16 NOTE — Progress Notes (Signed)
EEG Completed. Results pending.

## 2013-01-17 DIAGNOSIS — R569 Unspecified convulsions: Secondary | ICD-10-CM

## 2013-01-17 DIAGNOSIS — M109 Gout, unspecified: Secondary | ICD-10-CM

## 2013-01-17 DIAGNOSIS — R55 Syncope and collapse: Secondary | ICD-10-CM

## 2013-01-17 LAB — RENAL FUNCTION PANEL
CO2: 24 mEq/L (ref 19–32)
Calcium: 7.6 mg/dL — ABNORMAL LOW (ref 8.4–10.5)
GFR calc Af Amer: 6 mL/min — ABNORMAL LOW (ref >90)
GFR calc non Af Amer: 5 mL/min — ABNORMAL LOW (ref >90)
Potassium: 4.4 mEq/L (ref 3.5–5.1)
Sodium: 138 mEq/L (ref 135–145)

## 2013-01-17 LAB — GLUCOSE, CAPILLARY
Glucose-Capillary: 305 mg/dL — ABNORMAL HIGH (ref 70–99)
Glucose-Capillary: 175 mg/dL — ABNORMAL HIGH (ref 70–99)
Glucose-Capillary: 366 mg/dL — ABNORMAL HIGH (ref 70–99)
Glucose-Capillary: 365 mg/dL — ABNORMAL HIGH (ref 70–99)

## 2013-01-17 LAB — CBC
Hemoglobin: 8.3 g/dL — ABNORMAL LOW (ref 13.0–17.0)
MCH: 31.4 pg (ref 26.0–34.0)
Platelets: 122 10*3/uL — ABNORMAL LOW (ref 150–400)
RBC: 2.64 MIL/uL — ABNORMAL LOW (ref 4.22–5.81)
WBC: 8 10*3/uL (ref 4.0–10.5)

## 2013-01-17 MED ORDER — COLCHICINE 0.6 MG PO TABS
0.3000 mg | ORAL_TABLET | Freq: Once | ORAL | Status: AC
  Administered 2013-01-17: 0.3 mg via ORAL
  Filled 2013-01-17: qty 0.5

## 2013-01-17 MED ORDER — HEPARIN SODIUM (PORCINE) 1000 UNIT/ML DIALYSIS
20.0000 [IU]/kg | INTRAMUSCULAR | Status: DC | PRN
  Administered 2013-01-17: 1900 [IU] via INTRAVENOUS_CENTRAL

## 2013-01-17 MED ORDER — COLCHICINE 0.6 MG PO TABS
0.6000 mg | ORAL_TABLET | ORAL | Status: AC
  Administered 2013-01-17: 0.6 mg via ORAL
  Filled 2013-01-17: qty 1

## 2013-01-17 MED ORDER — CALCIUM CARBONATE ANTACID 500 MG PO CHEW
800.0000 mg | CHEWABLE_TABLET | Freq: Three times a day (TID) | ORAL | Status: DC
  Administered 2013-01-17 – 2013-01-18 (×4): 800 mg via ORAL
  Filled 2013-01-17 (×6): qty 4

## 2013-01-17 MED ORDER — AMLODIPINE BESYLATE 5 MG PO TABS
5.0000 mg | ORAL_TABLET | Freq: Every day | ORAL | Status: DC
  Administered 2013-01-17: 5 mg via ORAL
  Filled 2013-01-17 (×2): qty 1

## 2013-01-17 NOTE — Progress Notes (Signed)
Patient being transferred to unit 6700 per MD order. Report called to Doree Fudge, Charity fundraiser. Patient will travel in wheel chair, wife at bedside. All vitals WNL, patient alert and oriented.

## 2013-01-17 NOTE — Procedures (Signed)
Patient was seen on dialysis and the procedure was supervised.  BFR 350  Via PC BP is  197/60.   Patient appears to be tolerating treatment well  Albirda Shiel A 01/17/2013

## 2013-01-17 NOTE — Progress Notes (Signed)
Subjective: Seen on HD- continues to look better- no complaints.  Did have low grade temp overnight.  Dispo is in process, ? Need SNF- rehab has said no Objective Vital signs in last 24 hours: Filed Vitals:   01/17/13 0730 01/17/13 0800 01/17/13 0830 01/17/13 0900  BP: 128/73 129/65 117/61 96/54  Pulse: 76 73 72 71  Temp:      TempSrc:      Resp: 12     Height:      Weight:      SpO2: 100%      Weight change: -0.3 kg (-10.6 oz)  Intake/Output Summary (Last 24 hours) at 01/17/13 0905 Last data filed at 01/16/13 2200  Gross per 24 hour  Intake    203 ml  Output    276 ml  Net    -73 ml   Labs: Basic Metabolic Panel:  Recent Labs Lab 01/13/13 0205 01/13/13 0728 01/15/13 1900 01/17/13 0647  NA 141  --  138 138  K 4.2  --  4.9 4.4  CL 105  --  102 101  CO2 21  --  18* 24  GLUCOSE 154*  --  242* 143*  BUN 110*  --  116* 81*  CREATININE 9.28*  --  10.92* 8.99*  CALCIUM 7.0*  --  7.1* 7.6*  PHOS  --  4.8*  --  5.7*   Liver Function Tests:  Recent Labs Lab 01/13/13 0205 01/17/13 0647  AST 14  --   ALT 8  --   ALKPHOS 57  --   BILITOT 0.4  --   PROT 6.8  --   ALBUMIN 3.7 2.9*   No results found for this basename: LIPASE, AMYLASE,  in the last 168 hours No results found for this basename: AMMONIA,  in the last 168 hours CBC:  Recent Labs Lab 01/12/13 1230 01/12/13 1910 01/13/13 0205 01/15/13 1900 01/17/13 0639  WBC 7.7 6.8 4.8 10.0 8.0  NEUTROABS 6.7  --   --   --   --   HGB 10.7* 10.4* 10.0* 9.4* 8.3*  HCT 31.9* 29.6* 29.5* 27.3* 24.3*  MCV 93.5 92.2 93.1 93.2 92.0  PLT 134* 122* 118* 122* 122*   Cardiac Enzymes:  Recent Labs Lab 01/12/13 1910 01/13/13 0205 01/13/13 0728  TROPONINI <0.30 <0.30 <0.30   CBG:  Recent Labs Lab 01/15/13 1940 01/15/13 2323 01/16/13 0342 01/16/13 0806 01/16/13 2236  GLUCAP 211* 137* 131* 141* 305*    Iron Studies: No results found for this basename: IRON, TIBC, TRANSFERRIN, FERRITIN,  in the last 72  hours Studies/Results: Ct Head Wo Contrast  01/15/2013   *RADIOLOGY REPORT*  Clinical Data: Altered mental status.  CT HEAD WITHOUT CONTRAST  Technique:  Contiguous axial images were obtained from the base of the skull through the vertex without contrast.  Comparison: 01/12/2013  Findings: Patient motion degrades image quality.  The study is felt to be diagnostic. There is atrophy and chronic small vessel disease changes.  No definite acute infarction.  No hemorrhage or hydrocephalus.  No mass lesion or midline shift.  No acute calvarial abnormality.  IMPRESSION: Image quality degraded by patient motion.  No definite acute process.   Original Report Authenticated By: Charlett Nose, M.D.   Mr Brain Wo Contrast  01/17/2013   *RADIOLOGY REPORT*  Clinical Data: Question seizure.  Syncope and fall.  Diabetic.  MRI HEAD WITHOUT CONTRAST  Technique:  Multiplanar, multiecho pulse sequences of the brain and surrounding structures were obtained according to  standard protocol without intravenous contrast.  Comparison: 01/15/2013.  Findings: No acute infarct.  Tiny area of blood breakdown products posterior left temporal lobe, left frontal lobe and right occipital lobe may represent result of prior hemorrhagic ischemia although result of prior trauma not entirely excluded.  Broad-based (5 mm thickness) left hemispheric subdural collection with cerebrospinal fluid intensity suggestive of cystic hygroma ( versus chronic subdural hematoma).  Prominent small vessel disease type changes.  Global atrophy without hydrocephalus.  No intracranial mass lesion detected on this unenhanced exam.  Major intracranial vascular structures are patent.  Paranasal sinus mucosal thickening.  Cervical spondylotic changes with spinal stenosis and slight cord flattening C3-4 and left so at the C2-3 level.  Cervical medullary junction, pituitary region, pineal region and orbital structures unremarkable.  IMPRESSION: No acute infarct.  Tiny area of  blood breakdown products posterior left temporal lobe, left frontal lobe and right occipital lobe may represent result of prior hemorrhagic ischemia although result of prior trauma not entirely excluded.  Broad-based (5 mm thickness) left hemispheric subdural collection with cerebrospinal fluid intensity suggestive of cystic hygroma (versus chronic subdural hematoma).  Prominent small vessel disease type changes.  Global atrophy without hydrocephalus.  Paranasal sinus mucosal thickening.  Cervical spondylotic changes with spinal stenosis and slight cord flattening C3-4 and left so at the C2-3 level.   Original Report Authenticated By: Lacy Duverney, M.D.   Ir Fluoro Guide Cv Line Left  01/15/2013   *RADIOLOGY REPORT*  Indication: 79 year old with end-stage renal disease. Recently infiltrated right upper arm fistula.  The patient needs hemodialysis and a catheter.  PROCEDURE(S): FLUOROSCOPIC AND ULTRASOUND GUIDED PLACEMENT OF A TUNNELED DIAYSIS CATHETER  Physician:  Rachelle Hora. Lowella Dandy, MD  Medications: Ancef 2gm. Versed 2 mg, Fentanyl 100 mcg. A radiology nurse monitored the patient for moderate sedation.  As antibiotic prophylaxis, Ancef  was ordered pre-procedure and administered intravenously within one hour of incision.  Moderate sedation time: 38 minutes  Fluoroscopy time: 3 minutes and 8 seconds  Procedure:Informed consent was obtained for placement of a tunneled dialysis catheter.  The patient was placed supine on the interventional table.  Ultrasound confirmed a patent left internal jugularvein.  Ultrasound images were obtained for documentation. The left side of the neck was prepped and draped in a sterile fashion.  The left side of the neck was anesthetized with 1% lidocaine.  Maximal barrier sterile technique was utilized including caps, mask, sterile gowns, sterile gloves, sterile drape, hand hygiene and skin antiseptic.  A small incision was made with #11 blade scalpel.  A 21 gauge needle directed into the  left internal jugular vein with ultrasound guidance.  A micropuncture dilator set was placed.  A 27 cm tip to cuff HemoSplit catheter was selected.  The skin below the left clavicle was anesthetized and a small incision was made with an #11 blade scalpel.  A subcutaneous tunnel was formed to the vein dermatotomy site.  The catheter was brought through the tunnel.  The vein dermatotomy site was dilated to accommodate a peel-away sheath.  The catheter was placed through the peel-away sheath and directed into the central venous structures.  The tip of the catheter was placed in the upper right atrium with fluoroscopy.  Fluoroscopic images were obtained for documentation.  Both lumens were found to aspirate and flush well. The proper amount of heparin was flushed in both lumens.  The vein dermatotomy site was closed using a single layer of absorbable suture and Dermabond.  The catheter was secured  to the skin using Prolene suture.  Findings:Catheter tip in the upper right atrium.  Complications: None  Impression:Successful placement of a left jugular tunneled dialysis catheter using ultrasound and fluoroscopic guidance.   Original Report Authenticated By: Richarda Overlie, M.D.   Ir US Guide Vasc Access Left  01/15/2013   *RADIOLOGY REPORT*  Indication: 79 year old with end-stage renal disease. Recently infiltrated right upper arm fistula.  The patient needs hemodialysis and a catheter.  PROCEDURE(S): FLUOROSCOPIC AND ULTRASOUND GUIDED PLACEMENT OF A TUNNELED DIAYSIS CATHETER  Physician:  Rachelle Hora. Lowella Dandy, MD  Medications: Ancef 2gm. Versed 2 mg, Fentanyl 100 mcg. A radiology nurse monitored the patient for moderate sedation.  As antibiotic prophylaxis, Ancef  was ordered pre-procedure and administered intravenously within one hour of incision.  Moderate sedation time: 38 minutes  Fluoroscopy time: 3 minutes and 8 seconds  Procedure:Informed consent was obtained for placement of a tunneled dialysis catheter.  The patient was  placed supine on the interventional table.  Ultrasound confirmed a patent left internal jugularvein.  Ultrasound images were obtained for documentation. The left side of the neck was prepped and draped in a sterile fashion.  The left side of the neck was anesthetized with 1% lidocaine.  Maximal barrier sterile technique was utilized including caps, mask, sterile gowns, sterile gloves, sterile drape, hand hygiene and skin antiseptic.  A small incision was made with #11 blade scalpel.  A 21 gauge needle directed into the left internal jugular vein with ultrasound guidance.  A micropuncture dilator set was placed.  A 27 cm tip to cuff HemoSplit catheter was selected.  The skin below the left clavicle was anesthetized and a small incision was made with an #11 blade scalpel.  A subcutaneous tunnel was formed to the vein dermatotomy site.  The catheter was brought through the tunnel.  The vein dermatotomy site was dilated to accommodate a peel-away sheath.  The catheter was placed through the peel-away sheath and directed into the central venous structures.  The tip of the catheter was placed in the upper right atrium with fluoroscopy.  Fluoroscopic images were obtained for documentation.  Both lumens were found to aspirate and flush well. The proper amount of heparin was flushed in both lumens.  The vein dermatotomy site was closed using a single layer of absorbable suture and Dermabond.  The catheter was secured to the skin using Prolene suture.  Findings:Catheter tip in the upper right atrium.  Complications: None  Impression:Successful placement of a left jugular tunneled dialysis catheter using ultrasound and fluoroscopic guidance.   Original Report Authenticated By: Richarda Overlie, M.D.   Medications: Infusions:    Scheduled Medications: . allopurinol  100 mg Oral Custom  . amLODipine  10 mg Oral Daily  . calcium carbonate  2 tablet Oral TID WC  . calcium carbonate  1 tablet Oral TID WC  . darbepoetin  (ARANESP) injection - DIALYSIS  60 mcg Intravenous Q Fri-HD  . doxercalciferol  3 mcg Intravenous Q T,Th,Sa-HD  . insulin aspart  0-9 Units Subcutaneous TID WC  . multivitamin  1 tablet Oral Q supper  . naproxen  250 mg Oral BID WC  . pantoprazole  40 mg Oral BID AC  . sodium chloride  3 mL Intravenous Q12H  . sodium chloride  3 mL Intravenous Q12H  . valproate sodium  1 g Intravenous Once  . valproic acid  500 mg Oral BID    have reviewed scheduled and prn medications.  Physical Exam: General: NAD, wound over  left eye- more alert Heart: RRR Lungs: mostly clear Abdomen: soft, non tender Extremities: minimal edema Dialysis Access: right upper arm avf with big infiltration- good thrill, new left sided PC    Assessment/ Plan: Pt is a 79 y.o. yo male who was admitted on 01/12/2013 with  sz and hypocalcemia, now is new start to HD  Assessment/Plan: 1. Renal- new start to HD.  Got a little bit of treatment on 5/9 but then AVF infiltrated - now s/p PC on 5/12 so AVF can rest.  Received HD on 5/13, 5/14 and will plan on 5/15.  Has been set up TTS at [facility] as OP but timing of discharge could be complicated because they do not usually accept new pts to the unit on Saturday ? 2. HTN/volume- reasonable norvasc 10, will decrease ot 5 qHS 3. Anemia- was on procrit- now on aranesp here- hgb dropping- will check iron stores as well and treat as needed.  hgb has dropped will place parameters for transfusion if needed with HD tomorrow 4. Secondary hyperparathyroidism- on TUMS for binding- recent PTH pending- on 3/31 was 672- will give hectorol with HD.  Continue renavite 5. sz- I agree was probably just lowering of sz threshold with uremia but now on valproic acid- no chronic  sz meds indicated ? Now per neuro sz w/up in process 6. Fever- per primary, no further fevers, no antibiotics, cultures negative- could be low grade temp from AVF infiltration?  Jonh Mcqueary A   01/17/2013,9:05 AM  LOS:  5 days

## 2013-01-17 NOTE — Progress Notes (Signed)
Subjective: No further seizure activity  Exam: Filed Vitals:   01/17/13 1913  BP: 138/65  Pulse: 84  Temp: 97.5 F (36.4 C)  Resp: 18   Gen: In bed, NAD MS: Awake, Alert, oriented and appropriate QI:ONGEX, EOMI Motor: 5/5 strength throughout, though his hand movements are limited by gout.  Sensory:intact to LT  Impression: 79 yo M with a history of htn, renal failure. He has a couple of microhemorrhages on MRI one of which abuts the cortex in teh left temporal region and he has sharp waves appearing from this region on EEG as well. I suspect that his multiple medical abnormalities contributed to hgis seizure, but with these findings on EEG and MRI, I do feel that continued AED therapy is indicated.   Recommendations: 1)Recheck VPA level tomorrow, if still low, will increase to 750mg  BID.   Ritta Slot, MD [facility] [phone]  If 7pm- 7am, please page neurology on call at [phone].

## 2013-01-17 NOTE — Progress Notes (Addendum)
[facility] PROGRESS NOTE  [name] ZOX:[id number] DOB: Jul 09, 1932 DOA: 01/12/2013 PCP: Cecille Aver, MD Brief Narrative [name] is a 79 y.o. male with a history of CKD5, diabetes, hypertension, recent history of GI bleeding from AVMs presented to ER after a syncopal spell with AMS.His spouse reported  while patient was in the bathroom he sustained a syncopal event. His wife was downstairs, she heard him fall and ran upstairs, she found him on the bathroom floor wedged between the wall and the commode. He was apparently awake but lethargic and somewhat confused. His suspected seizure was suspected to be from CKD with Uremia/hypocalcemia, he had a partial HD treatment on admission and was clinically improved, further HD treatments got postponed due to access and 5/12 after HD catheter placement in afternoon he had acute MS changes followed by a seizure. Stat CT head negative, Seen By Neuro, loaded with Depakote, EEG/MRI pending Transferred to SDU following seizure Then had full HD treatment early this am   Assessment/Plan: Seizure witnessed 5/12 and possibly unwitnessed prior to admission -suspect seizure due to hypocalcemia and uremia: metabolic in etiology -had symptoms of shakes and jerks for few days prior to admission -started HD 5/9 but only partial treatment, full HD early this am -given IV calcium gluconate 5/12 -CT head 5/9 and 5/12 unremarkable -MRI/EEG pending -Loaded with Depakote 5/12 and started on maintenance- level not yet therapeutic -Neuro following, greatly appreciate consult   Fever: on 5/12 after seizure -afebrile since -negative Blood Cx x2 -Monitor RUE AVF site  . Progressive CKD (chronic kidney disease) stage 5, GFR less than 15 ml/min  -now ESRD -started HD 5/9 -access issues, s/p HD catheter placement 5/12 and HD resumed 5/13   Gout - has been on low dose naprosyn - started yesterday but not much improvement noted -give Colchicine  today  . Anemia in chronic kidney disease  -Hb stable -aranesp per Renal  . Diabetes type 2, controlled  -not on any oral hypoglycemic agents.  - sugars high likey due to the fact that he is not on a diabetic diet- will change to carb modified (pt just ate all of his cake) - SSI for now.   . Hypertension  -Continue with amlodipine.  -expect this to improve with HD  . Metabolic acidosis, increased anion gap  -Secondary to chronic kidney disease, improved  with HD   DVT Prophylaxis:  Prophylactic Heparin   Code Status:  Full Code   Dispo: CIR consult  Consultants:  Renal Dr.Mattingly/[place]  Neurology  Procedures:  HD started 5/9  HD catheter 5/12  HPI/Subjective: Alert, no complaints other than pain in his hand from "the gout" which started a few days ago.   Objective: Filed Vitals:   01/17/13 0930 01/17/13 1000 01/17/13 1019 01/17/13 1100  BP: 108/56 116/60 136/61   Pulse: 73 79 75   Temp:   97.6 F (36.4 C) 98.7 F (37.1 C)  TempSrc:   Oral Oral  Resp:   13   Height:      Weight:   92.8 kg (204 lb 9.4 oz)   SpO2:   100%     Intake/Output Summary (Last 24 hours) at 01/17/13 1320 Last data filed at 01/17/13 1200  Gross per 24 hour  Intake    283 ml  Output   2150 ml  Net  -1867 ml   Filed Weights   01/17/13 0400 01/17/13 0627 01/17/13 1019  Weight: 95.1 kg (209 lb 10.5 oz) 94.8 kg (208 lb  15.9 oz) 92.8 kg (204 lb 9.4 oz)    Exam:   General:  Awake, alert, oriented to self, place and partly to time, laceration on forehead with sutures  HEENT: R eye subconjunctival hge  Cardiovascular: S1S2/RRR  Respiratory: CTAB  Abdomen: soft, NT, BS present  Ext: no edema c/c- right hand- swollen PIP on index finger  Neuro: higher function significantly improved, non focal exam  Data Reviewed: Basic Metabolic Panel:  Recent Labs Lab 01/12/13 1230 01/12/13 1910 01/13/13 0205 01/13/13 0728 01/15/13 1900 01/17/13 0647  NA 139  --  141  --   138 138  K 5.0  --  4.2  --  4.9 4.4  CL 103  --  105  --  102 101  CO2 16*  --  21  --  18* 24  GLUCOSE 173*  --  154*  --  242* 143*  BUN 146*  --  110*  --  116* 81*  CREATININE 10.77* 8.49* 9.28*  --  10.92* 8.99*  CALCIUM 6.3*  --  7.0*  --  7.1* 7.6*  PHOS  --   --   --  4.8*  --  5.7*   Liver Function Tests:  Recent Labs Lab 01/13/13 0205 01/17/13 0647  AST 14  --   ALT 8  --   ALKPHOS 57  --   BILITOT 0.4  --   PROT 6.8  --   ALBUMIN 3.7 2.9*   No results found for this basename: LIPASE, AMYLASE,  in the last 168 hours No results found for this basename: AMMONIA,  in the last 168 hours CBC:  Recent Labs Lab 01/12/13 1230 01/12/13 1910 01/13/13 0205 01/15/13 1900 01/17/13 0639  WBC 7.7 6.8 4.8 10.0 8.0  NEUTROABS 6.7  --   --   --   --   HGB 10.7* 10.4* 10.0* 9.4* 8.3*  HCT 31.9* 29.6* 29.5* 27.3* 24.3*  MCV 93.5 92.2 93.1 93.2 92.0  PLT 134* 122* 118* 122* 122*   Cardiac Enzymes:  Recent Labs Lab 01/12/13 1910 01/13/13 0205 01/13/13 0728  TROPONINI <0.30 <0.30 <0.30   BNP (last 3 results) No results found for this basename: PROBNP,  in the last 8760 hours CBG:  Recent Labs Lab 01/15/13 2323 01/16/13 0342 01/16/13 0806 01/16/13 2236 01/17/13 1157  GLUCAP 137* 131* 141* 305* 175*    Recent Results (from the past 240 hour(s))  CULTURE, BLOOD (ROUTINE X 2)     Status: None   Collection Time    01/15/13  6:20 PM      Result Value Range Status   Specimen Description BLOOD HAND LEFT   Final   Special Requests BOTTLES DRAWN AEROBIC AND ANAEROBIC 10CC   Final   Culture  Setup Time 01/16/2013 00:45   Final   Culture     Final   Value:        BLOOD CULTURE RECEIVED NO GROWTH TO DATE CULTURE WILL BE HELD FOR 5 DAYS BEFORE ISSUING A FINAL NEGATIVE REPORT   Report Status PENDING   Incomplete  CULTURE, BLOOD (ROUTINE X 2)     Status: None   Collection Time    01/15/13  6:30 PM      Result Value Range Status   Specimen Description BLOOD ARM LEFT    Final   Special Requests BOTTLES DRAWN AEROBIC ONLY 10CC   Final   Culture  Setup Time 01/16/2013 00:44   Final   Culture     Final  Value:        BLOOD CULTURE RECEIVED NO GROWTH TO DATE CULTURE WILL BE HELD FOR 5 DAYS BEFORE ISSUING A FINAL NEGATIVE REPORT   Report Status PENDING   Incomplete     Studies: Ct Head Wo Contrast  01/15/2013   *RADIOLOGY REPORT*  Clinical Data: Altered mental status.  CT HEAD WITHOUT CONTRAST  Technique:  Contiguous axial images were obtained from the base of the skull through the vertex without contrast.  Comparison: 01/12/2013  Findings: Patient motion degrades image quality.  The study is felt to be diagnostic. There is atrophy and chronic small vessel disease changes.  No definite acute infarction.  No hemorrhage or hydrocephalus.  No mass lesion or midline shift.  No acute calvarial abnormality.  IMPRESSION: Image quality degraded by patient motion.  No definite acute process.   Original Report Authenticated By: Charlett Nose, M.D.   Mr Brain Wo Contrast  01/17/2013   *RADIOLOGY REPORT*  Clinical Data: Question seizure.  Syncope and fall.  Diabetic.  MRI HEAD WITHOUT CONTRAST  Technique:  Multiplanar, multiecho pulse sequences of the brain and surrounding structures were obtained according to standard protocol without intravenous contrast.  Comparison: 01/15/2013.  Findings: No acute infarct.  Tiny area of blood breakdown products posterior left temporal lobe, left frontal lobe and right occipital lobe may represent result of prior hemorrhagic ischemia although result of prior trauma not entirely excluded.  Broad-based (5 mm thickness) left hemispheric subdural collection with cerebrospinal fluid intensity suggestive of cystic hygroma ( versus chronic subdural hematoma).  Prominent small vessel disease type changes.  Global atrophy without hydrocephalus.  No intracranial mass lesion detected on this unenhanced exam.  Major intracranial vascular structures are patent.   Paranasal sinus mucosal thickening.  Cervical spondylotic changes with spinal stenosis and slight cord flattening C3-4 and left so at the C2-3 level.  Cervical medullary junction, pituitary region, pineal region and orbital structures unremarkable.  IMPRESSION: No acute infarct.  Tiny area of blood breakdown products posterior left temporal lobe, left frontal lobe and right occipital lobe may represent result of prior hemorrhagic ischemia although result of prior trauma not entirely excluded.  Broad-based (5 mm thickness) left hemispheric subdural collection with cerebrospinal fluid intensity suggestive of cystic hygroma (versus chronic subdural hematoma).  Prominent small vessel disease type changes.  Global atrophy without hydrocephalus.  Paranasal sinus mucosal thickening.  Cervical spondylotic changes with spinal stenosis and slight cord flattening C3-4 and left so at the C2-3 level.   Original Report Authenticated By: Lacy Duverney, M.D.    Scheduled Meds: . allopurinol  100 mg Oral Custom  . amLODipine  5 mg Oral QHS  . calcium carbonate  800 mg of elemental calcium Oral TID WC  . darbepoetin (ARANESP) injection - DIALYSIS  60 mcg Intravenous Q Fri-HD  . doxercalciferol  3 mcg Intravenous Q T,Th,Sa-HD  . insulin aspart  0-9 Units Subcutaneous TID WC  . multivitamin  1 tablet Oral Q supper  . naproxen  250 mg Oral BID WC  . pantoprazole  40 mg Oral BID AC  . sodium chloride  3 mL Intravenous Q12H  . sodium chloride  3 mL Intravenous Q12H  . valproate sodium  1 g Intravenous Once  . valproic acid  500 mg Oral BID   Continuous Infusions:   Principal Problem:   Syncope Active Problems:   Progressive CKD (chronic kidney disease) stage 5, GFR less than 15 ml/min   Hypertension   history of AVM (arteriovenous malformation)  of colon with hemorrhage   Metabolic acidosis, increased anion gap   Anemia in chronic kidney disease   Diabetes type 2, controlled   Seizures    Time  spent:64min    [facility]  [facility] Pager [phone]. If 7PM-7AM, please contact night-coverage at [email or web address], password [email or web address] 01/17/2013, 1:20 PM  LOS: 5 days

## 2013-01-17 NOTE — Procedures (Signed)
EEG NUMBER:  REFERRING PHYSICIAN:  Ghimire  HISTORY:  A 79 year old male with new onset seizure in the clinical setting of multiple metabolic abnormalities.  MEDICATIONS:  Depacon, Zyloprim, Os-Cal, Tums, Aranesp, Hectorol, Atarax, NovoLog, Rena-Vite, Protonix.  CONDITIONS OF RECORDING:  This is a 16-channel EEG carried out with patient in the awake, drowsy, and asleep states.  DESCRIPTION:  The background activity during wakefulness consist of mostly a mixture of delta and theta rhythms.  Alpha is only achieved on rare occasions and is poorly sustained.  With drowse, the patient has a build up of the slowing of delta and theta rhythms with a higher voltage noted and a more prominent underlying polymorphic delta rhythm.  Also noted is sharp activity over the left hemisphere that is rare.  No evidence of stage II sleep is noted.  Hyperventilation and intermittent photic stimulation were not performed.  IMPRESSION:  This is an abnormal EEG secondary to general background slowing.  Although this can be seen with drowse, cannot rule out the possibility of diffuse cerebral disturbance that is etiologically nonspecific but may include a metabolic encephalopathy among other possibilities.  Also noted was rare sharp activity over the left hemisphere.          ______________________________ Thana Farr, MD    NW:GNFA D:  01/16/2013 19:52:09  T:  01/17/2013 19:28:02  Job #:  [id number]

## 2013-01-18 ENCOUNTER — Other Ambulatory Visit (HOSPITAL_COMMUNITY): Payer: Self-pay | Admitting: *Deleted

## 2013-01-18 LAB — RENAL FUNCTION PANEL
Albumin: 2.9 g/dL — ABNORMAL LOW (ref 3.5–5.2)
BUN: 52 mg/dL — ABNORMAL HIGH (ref 6–23)
Chloride: 95 mEq/L — ABNORMAL LOW (ref 96–112)
Creatinine, Ser: 6.83 mg/dL — ABNORMAL HIGH (ref 0.50–1.35)
Glucose, Bld: 165 mg/dL — ABNORMAL HIGH (ref 70–99)
Potassium: 4.2 mEq/L (ref 3.5–5.1)

## 2013-01-18 LAB — IRON AND TIBC
Saturation Ratios: 8 % — ABNORMAL LOW (ref 20–55)
UIBC: 158 ug/dL (ref 125–400)

## 2013-01-18 LAB — CBC
HCT: 25.4 % — ABNORMAL LOW (ref 39.0–52.0)
Hemoglobin: 8.5 g/dL — ABNORMAL LOW (ref 13.0–17.0)
MCH: 31 pg (ref 26.0–34.0)
MCV: 92.7 fL (ref 78.0–100.0)
Platelets: 142 10*3/uL — ABNORMAL LOW (ref 150–400)
RBC: 2.74 MIL/uL — ABNORMAL LOW (ref 4.22–5.81)
WBC: 7.9 10*3/uL (ref 4.0–10.5)

## 2013-01-18 LAB — GLUCOSE, CAPILLARY: Glucose-Capillary: 198 mg/dL — ABNORMAL HIGH (ref 70–99)

## 2013-01-18 MED ORDER — DOXERCALCIFEROL 4 MCG/2ML IV SOLN
INTRAVENOUS | Status: AC
  Administered 2013-01-18: 3 ug via INTRAVENOUS
  Filled 2013-01-18: qty 2

## 2013-01-18 MED ORDER — DIVALPROEX SODIUM 500 MG PO DR TAB
750.0000 mg | DELAYED_RELEASE_TABLET | Freq: Two times a day (BID) | ORAL | Status: DC
  Administered 2013-01-18: 750 mg via ORAL
  Filled 2013-01-18 (×2): qty 1

## 2013-01-18 MED ORDER — DIVALPROEX SODIUM 250 MG PO DR TAB: 750.0000 mg | DELAYED_RELEASE_TABLET | Freq: Two times a day (BID) | ORAL | Status: DC

## 2013-01-18 NOTE — Procedures (Signed)
Patient was seen on dialysis and the procedure was supervised.  BFR 400  Via PC BP is  114/58.   Patient appears to be tolerating treatment well  Denson Niccoli A 01/18/2013

## 2013-01-18 NOTE — Progress Notes (Signed)
Subjective: Seen on HD- continues to look better- no complaints.  No fever- Dispo is in process, ? Need SNF- rehab has said no Objective Vital signs in last 24 hours: Filed Vitals:   01/18/13 0730 01/18/13 0800 01/18/13 0830 01/18/13 0900  BP: 114/57 137/57 126/60 114/58  Pulse: 83 84 84 85  Temp:      TempSrc:      Resp:      Height:      Weight:      SpO2:  100% 100%    Weight change: -2.3 kg (-5 lb 1.1 oz)  Intake/Output Summary (Last 24 hours) at 01/18/13 0925 Last data filed at 01/17/13 2117  Gross per 24 hour  Intake    243 ml  Output   2100 ml  Net  -1857 ml   Labs: Basic Metabolic Panel:  Recent Labs Lab 01/13/13 0728 01/15/13 1900 01/17/13 0647 01/18/13 0656  NA  --  138 138 135  K  --  4.9 4.4 4.2  CL  --  102 101 95*  CO2  --  18* 24 22  GLUCOSE  --  242* 143* 165*  BUN  --  116* 81* 52*  CREATININE  --  10.92* 8.99* 6.83*  CALCIUM  --  7.1* 7.6* 7.2*  PHOS 4.8*  --  5.7* 5.5*   Liver Function Tests:  Recent Labs Lab 01/13/13 0205 01/17/13 0647 01/18/13 0656  AST 14  --   --   ALT 8  --   --   ALKPHOS 57  --   --   BILITOT 0.4  --   --   PROT 6.8  --   --   ALBUMIN 3.7 2.9* 2.9*   No results found for this basename: LIPASE, AMYLASE,  in the last 168 hours No results found for this basename: AMMONIA,  in the last 168 hours CBC:  Recent Labs Lab 01/12/13 1230 01/12/13 1910 01/13/13 0205 01/15/13 1900 01/17/13 0639 01/18/13 0656  WBC 7.7 6.8 4.8 10.0 8.0 7.9  NEUTROABS 6.7  --   --   --   --   --   HGB 10.7* 10.4* 10.0* 9.4* 8.3* 8.5*  HCT 31.9* 29.6* 29.5* 27.3* 24.3* 25.4*  MCV 93.5 92.2 93.1 93.2 92.0 92.7  PLT 134* 122* 118* 122* 122* 142*   Cardiac Enzymes:  Recent Labs Lab 01/12/13 1910 01/13/13 0205 01/13/13 0728  TROPONINI <0.30 <0.30 <0.30   CBG:  Recent Labs Lab 01/16/13 2236 01/17/13 1157 01/17/13 1603 01/17/13 1729 01/17/13 2157  GLUCAP 305* 175* 366* 365* 268*    Iron Studies: No results found for  this basename: IRON, TIBC, TRANSFERRIN, FERRITIN,  in the last 72 hours Studies/Results: Mr Brain Wo Contrast  01/17/2013   *RADIOLOGY REPORT*  Clinical Data: Question seizure.  Syncope and fall.  Diabetic.  MRI HEAD WITHOUT CONTRAST  Technique:  Multiplanar, multiecho pulse sequences of the brain and surrounding structures were obtained according to standard protocol without intravenous contrast.  Comparison: 01/15/2013.  Findings: No acute infarct.  Tiny area of blood breakdown products posterior left temporal lobe, left frontal lobe and right occipital lobe may represent result of prior hemorrhagic ischemia although result of prior trauma not entirely excluded.  Broad-based (5 mm thickness) left hemispheric subdural collection with cerebrospinal fluid intensity suggestive of cystic hygroma ( versus chronic subdural hematoma).  Prominent small vessel disease type changes.  Global atrophy without hydrocephalus.  No intracranial mass lesion detected on this unenhanced exam.  Major intracranial  vascular structures are patent.  Paranasal sinus mucosal thickening.  Cervical spondylotic changes with spinal stenosis and slight cord flattening C3-4 and left so at the C2-3 level.  Cervical medullary junction, pituitary region, pineal region and orbital structures unremarkable.  IMPRESSION: No acute infarct.  Tiny area of blood breakdown products posterior left temporal lobe, left frontal lobe and right occipital lobe may represent result of prior hemorrhagic ischemia although result of prior trauma not entirely excluded.  Broad-based (5 mm thickness) left hemispheric subdural collection with cerebrospinal fluid intensity suggestive of cystic hygroma (versus chronic subdural hematoma).  Prominent small vessel disease type changes.  Global atrophy without hydrocephalus.  Paranasal sinus mucosal thickening.  Cervical spondylotic changes with spinal stenosis and slight cord flattening C3-4 and left so at the C2-3 level.    Original Report Authenticated By: Lacy Duverney, M.D.   Medications: Infusions:    Scheduled Medications: . amLODipine  5 mg Oral QHS  . calcium carbonate  800 mg of elemental calcium Oral TID WC  . darbepoetin (ARANESP) injection - DIALYSIS  60 mcg Intravenous Q Fri-HD  . divalproex  750 mg Oral Q12H  . doxercalciferol  3 mcg Intravenous Q T,Th,Sa-HD  . insulin aspart  0-9 Units Subcutaneous TID WC  . multivitamin  1 tablet Oral Q supper  . naproxen  250 mg Oral BID WC  . pantoprazole  40 mg Oral BID AC  . sodium chloride  3 mL Intravenous Q12H  . sodium chloride  3 mL Intravenous Q12H    have reviewed scheduled and prn medications.  Physical Exam: General: NAD, wound over left eye- more alert Heart: RRR Lungs: mostly clear Abdomen: soft, non tender Extremities: minimal edema Dialysis Access: right upper arm avf with big infiltration- good thrill, new left sided PC    Assessment/ Plan: Pt is a 79 y.o. yo male who was admitted on 01/12/2013 with  sz and hypocalcemia, now is new start to HD  Assessment/Plan: 1. Renal- new start to HD.  Got a little bit of treatment on 5/9 but then AVF infiltrated - now s/p PC on 5/12 so AVF can rest.  Received HD on 5/13, 5/14 and  5/15.  Has been set up T[facility] at [facility] as [facility] but timing of discharge could be complicated because they do not usually accept new pts to the unit on Saturday ? 2. HTN/volume- reasonable - have decreased norvasc this hosp 3. Anemia- was on procrit- now on aranesp here- hgb dropping- will check iron stores as well and treat as needed.  hgb has dropped - follow closely 4. Secondary hyperparathyroidism- on TUMS for binding- PTH 513- on hectorol with HD.  Continue renavite 5. sz-With findings on EEG- neuro rec chronic treatment with depakote 6. Fever- per primary, no further fevers, no antibiotics, cultures negative- could be low grade temp from AVF infiltration? 7. Dispo- as above even though has OP HD placement will likely  need to stay until at least Sat given the HD units inability to accept new pts on Saturday.    Angelly Spearing A   01/18/2013,9:25 AM  LOS: 6 days

## 2013-01-18 NOTE — Progress Notes (Signed)
Physical Therapy Treatment Patient Details Name: [name] MRN: [id number] DOB: 02-Aug-1932 Today's Date: 01/18/2013 Time: 4098-1191 PT Time Calculation (min): 14 min  PT Assessment / Plan / Recommendation Comments on Treatment Session  Pt adm with seizure and new to HD.  Pt with improved mobility today.  Pt and family not interested in SNF and plan to take pt home.  Recommend home with family and HHPT.    Follow Up Recommendations  Home health PT;Supervision for mobility/OOB     Does the patient have the potential to tolerate intense rehabilitation     Barriers to Discharge        Equipment Recommendations  None recommended by PT (pt has rolling walker at home already)    Recommendations for Other Services    Frequency Min 3X/week   Plan Discharge plan needs to be updated;Frequency remains appropriate    Precautions / Restrictions Precautions Precautions: Fall   Pertinent Vitals/Pain Reports left hand pain is improved.    Mobility  Bed Mobility Bed Mobility: Sitting - Scoot to Edge of Bed Supine to Sit: 5: Supervision;HOB elevated;With rails Sitting - Scoot to Edge of Bed: 5: Supervision Details for Bed Mobility Assistance: Incr time but no physical assist needed. Transfers Sit to Stand: 4: Min assist;With upper extremity assist;From bed Stand to Sit: 4: Min assist;With upper extremity assist;With armrests;To chair/3-in-1 Ambulation/Gait Ambulation/Gait Assistance: 4: Min assist Ambulation Distance (Feet): 225 Feet Assistive device: Rolling walker Ambulation/Gait Assistance Details: verbal cues to extend hips and trunk Gait Pattern: Step-through pattern;Decreased stride length;Trunk flexed    Exercises     PT Diagnosis:    PT Problem List:   PT Treatment Interventions:     PT Goals Acute Rehab PT Goals PT Goal: Supine/Side to Sit - Progress: Progressing toward goal PT Goal: Sit to Stand - Progress: Progressing toward goal PT Goal: Stand to Sit - Progress:  Progressing toward goal PT Goal: Ambulate - Progress: Progressing toward goal  Visit Information  Last PT Received On: 01/18/13 Assistance Needed: +1 Reason Eval/Treat Not Completed: Patient at procedure or test/unavailable    Subjective Data  Subjective: Pt reports it was gout in his left hand and it is now improving.   Cognition  Cognition Arousal/Alertness: Awake/alert Behavior During Therapy: WFL for tasks assessed/performed Overall Cognitive Status: Within Functional Limits for tasks assessed    Balance  Static Standing Balance Static Standing - Balance Support: Bilateral upper extremity supported Static Standing - Level of Assistance: 5: Stand by assistance  End of Session PT - End of Session Equipment Utilized During Treatment: Gait belt Activity Tolerance: Patient tolerated treatment well Patient left: in chair;with family/visitor present;with call bell/phone within reach Nurse Communication: Mobility status   GP     [name] 01/18/2013, 3:31 PM  [employer] PT [phone]

## 2013-01-18 NOTE — Progress Notes (Signed)
Subjective: No further events overnight.   Exam: Filed Vitals:   01/18/13 0800  BP: 137/57  Pulse: 84  Temp:   Resp:    Gen: In bed, NAD MS: Awake, Alert, Oriented and appropriate UJ:WJXBJ, EOMI, VFF Motor: MAEW   Impression: 79 yo M with single seizure in the setting of pericortical microhemorrhage in the left temporal lobe and left sided sharp waves on EEG. With these findings I do feel that continued antiepileptic therapy is indicated.  His valproic acid level is low today.  Recommendations: 1)Increase to 750mg  depakote(also cahnged to DR formulation) 2) He will need to schedule follow up as an outpatient with neurology.  Ritta Slot, MD [facility] [phone]  If 7pm- 7am, please page neurology on call at [phone].

## 2013-01-18 NOTE — Progress Notes (Signed)
Pt discharged to home after visit summary reviewed and pt capable of re verbalizing medications and follow up appointments. Pt remains stable. No signs and symptoms of distress. Educated to return to ER in the event of SOB, dizziness, chest pain, or fainting. Reni Hausner, RN   

## 2013-01-18 NOTE — Progress Notes (Signed)
PT Cancellation Note  Patient Details Name: [name] MRN: [id number] DOB: [birth year]   Cancelled Treatment:    Reason Eval/Treat Not Completed: Patient at procedure or test/unavailable   [facility] 01/18/2013, 11:47 AM

## 2013-01-18 NOTE — Care Management Note (Signed)
   CARE MANAGEMENT NOTE 01/18/2013  Patient:  [facility]   Account Number:  [id number]  Date Initiated:  01/16/2013  Documentation initiated by:  Donn Pierini  Subjective/Objective Assessment:   Pt admitted with syncope     Action/Plan:   PTA pt lived at home with spouse- NCM to follow pt progression for d/c needs  5/15/ 14 Pt for d/c to home with [facility].   Anticipated DC Date:  01/18/2013   Anticipated DC Plan:  HOME W HOME HEALTH SERVICES  In-house referral  Clinical Social Worker      DC Planning Services  CM consult      Choice offered to / List presented to:          [facility] arranged  HH-2 PT      [facility] agency  [facility]   Status of service:  Completed, signed off Medicare Important Message given?   (If response is "NO", the following Medicare IM given date fields will be blank) Date Medicare IM given:   Date Additional Medicare IM given:    Discharge Disposition:  HOME W HOME HEALTH SERVICES  Per UR Regulation:  Reviewed for med. necessity/level of care/duration of stay  If discussed at Long Length of Stay Meetings, dates discussed:   01/18/2013    Comments:   01/18/2013 Pt for d/c to home with HHPT, pt and wife wish to use [facility] for HHPT.  Pt has walker for home use, no further DME needs.   CRoyal RN MPH, case manager, [phone]  01/17/13- 1600- Donn Pierini RN, BSN [phone] Pt with new HD started on 01/12/13- clipping has been done and outpt HD to be T/TH/Sat at [facility]- CIR was consulted and pt not appropriate- SNF recommended- CSW consulted for placement-  01/16/13- 1000- Donn Pierini RN, BSN [phone] Pt tx from 6700 on 01/15/13 secondary to acute MS changes followed by seizure activity- loaded with Depakote- neuro following EEG pending

## 2013-01-18 NOTE — Discharge Summary (Signed)
Physician Discharge Summary  [name] YNW:[id number] DOB: 1932/03/22 DOA: 01/12/2013  PCP: Cecille Aver, MD  Admit date: 01/12/2013 Discharge date: 01/18/2013  Time spent: >45 minutes  Recommendations for Outpatient Follow-up:  1. Valproic acid level in 1-2 wks.   Discharge Diagnoses:  Principal Problem:   Syncope Active Problems:   Progressive CKD (chronic kidney disease) stage 5, GFR less than 15 ml/min   Hypertension   history of AVM (arteriovenous malformation) of colon with hemorrhage   Metabolic acidosis, increased anion gap   Anemia in chronic kidney disease   Diabetes type 2, controlled   Seizures   Gout flare   Discharge Condition: stable- home with home health PT  Diet recommendation: renal, diabetic, heart healthy  Filed Weights   01/17/13 1019 01/18/13 0635 01/18/13 1043  Weight: 92.8 kg (204 lb 9.4 oz) 91.6 kg (201 lb 15.1 oz) 88.1 kg (194 lb 3.6 oz)    History of present illness:  [name] is a 79 y.o. male with a history of CKD5, diabetes, hypertension, recent history of GI bleeding from AVMs presented to ER after a syncopal spell with AMS.His spouse reported while patient was in the bathroom he sustained a syncopal event. His wife was downstairs, she heard him fall and ran upstairs, she found him on the bathroom floor wedged between the wall and the commode. He was apparently awake but lethargic and somewhat confused.  His suspected seizure was suspected to be from CKD with Uremia/hypocalcemia, he had a partial HD treatment on admission and was clinically improved, further HD treatments got postponed due to access and 5/12 after HD catheter placement in afternoon he had acute MS changes followed by a seizure. Stat CT head negative, Seen By Neuro, loaded with Depakote, EEG/MRI pending  Transferred to SDU following seizure   Hospital Course:  Seizure witnessed 5/12 and possibly unwitnessed prior to admission  -suspect seizure due to hypocalcemia and  uremia- but also noted to have blood remnants due to prior bleed in temporal lobe and sharp wave noted is this area on EEG and therefore, Neuro would like to continue AED treatment - cont Depakote at 750 BID and obtain level  -given IV calcium gluconate 5/12  -CT head 5/9 and 5/12 unremarkable  -will need outpt f/u with Neuro  Fever: on 5/12 after seizure  -afebrile since  -negative Blood Cx x2   . Progressive CKD (chronic kidney disease) stage 5, GFR less than 15 ml/min /. Metabolic acidosis, increased anion gap  -now ESRD  -started HD 5/9  -access issues, s/p HD catheter placement 5/12 and HD resumed 5/13   Gout - resolved with low dose NSAIDS and Colchicine  . Anemia in chronic kidney disease  -Hb stable  -aranesp per Renal   . Diabetes type 2, controlled  -not on any oral hypoglycemic agents.   . Hypertension  -Continue with amlodipine.    -Secondary to chronic kidney disease, improved with HD    Consultations:  Neuro  nephro  Discharge Exam: Filed Vitals:   01/18/13 1041 01/18/13 1043 01/18/13 1235 01/18/13 1405  BP: 112/58 123/56 125/55 120/63  Pulse: 83 84 76 84  Temp:  97.7 F (36.5 C) 97.9 F (36.6 C) 98.5 F (36.9 C)  TempSrc:  Oral Oral Oral  Resp: 11 14 16 16   Height:      Weight:  88.1 kg (194 lb 3.6 oz)    SpO2: 100% 100% 100% 100%    General: AAO x 3 HEENT: subconjunctival hemorrhage in  right eye Cardiovascular: RRR, no murmurs Respiratory: CTA b/l  Discharge Instructions  Discharge Orders   Future Appointments Provider Department Dept Phone   01/19/2013 10:45 AM [facility] [phone]   Future Orders Complete By Expires     Diet - low sodium heart healthy  As directed     Increase activity slowly  As directed         Medication List    STOP taking these medications       sodium bicarbonate 650 MG tablet     sodium chloride 0.65 % nasal spray  Commonly known as:  OCEAN       TAKE these medications       allopurinol 100 MG tablet  Commonly known as:  ZYLOPRIM  Take 100 mg by mouth 2 (two) times a week. Monday and Thursday     amLODipine 10 MG tablet  Commonly known as:  NORVASC  Take 10 mg by mouth Daily.     calcium carbonate 500 MG chewable tablet  Commonly known as:  TUMS - dosed in mg elemental calcium  Chew 1 tablet (200 mg of elemental calcium total) by mouth 3 (three) times daily with meals.     diphenhydramine-acetaminophen 25-500 MG Tabs  Commonly known as:  TYLENOL PM  Take 1 tablet by mouth at bedtime as needed (for pain).     divalproex 250 MG DR tablet  Commonly known as:  DEPAKOTE  Take 3 tablets (750 mg total) by mouth every 12 (twelve) hours.     ferrous sulfate 325 (65 FE) MG tablet  Take 325 mg by mouth 2 (two) times daily with a meal.     pantoprazole 20 MG tablet  Commonly known as:  PROTONIX  Take 20 mg by mouth 2 (two) times daily.       No Known Allergies     Follow-up Information   Follow up with [facility]. Schedule an appointment as soon as possible for a visit in 2 weeks.   Contact information:   [address] [place] [address] [phone]       The results of significant diagnostics from this hospitalization (including imaging, microbiology, ancillary and laboratory) are listed below for reference.    Significant Diagnostic Studies: Ct Head Wo Contrast  01/15/2013   *RADIOLOGY REPORT*  Clinical Data: Altered mental status.  CT HEAD WITHOUT CONTRAST  Technique:  Contiguous axial images were obtained from the base of the skull through the vertex without contrast.  Comparison: 01/12/2013  Findings: Patient motion degrades image quality.  The study is felt to be diagnostic. There is atrophy and chronic small vessel disease changes.  No definite acute infarction.  No hemorrhage or hydrocephalus.  No mass lesion or midline shift.  No acute calvarial abnormality.  IMPRESSION:  Image quality degraded by patient motion.  No definite acute process.   Original Report Authenticated By: Charlett Nose, M.D.   Ct Head Wo Contrast  01/12/2013   *RADIOLOGY REPORT*  Clinical Data: Fall, head injury The  CT HEAD WITHOUT CONTRAST,CT CERVICAL SPINE WITHOUT CONTRAST  Technique:  Contiguous axial images were obtained from the base of the skull through the vertex without contrast.,Technique: Multidetector CT imaging of the cervical spine was performed. Multiplanar CT image reconstructions were also generated.  Comparison: None.  Findings: No skull fracture is noted. The mastoid air cells are unremarkable.  No intracranial hemorrhage, mass effect or midline shift. Mild mucosal  thickening bilateral posterior aspect of the maxillary sinus.  No acute infarction. No mass lesion is noted on this unenhanced scan.  Bilateral basal ganglia punctate calcifications are noted.  Mild cerebral atrophy.  Periventricular and patchy subcortical white matter decreased attenuation probable due to chronic small vessel ischemic changes.  IMPRESSION: No acute intracranial abnormality.  Mild cerebral atrophy. Periventricular and subcortical white matter decreased attenuation probable due to chronic small vessel ischemic changes.  CT cervical spine without IV contrast:  Axial images of the cervical spine shows no acute fracture or subluxation.  Computer processed images shows mild degenerative changes C1-C2 articulation.  Minimal disc space flattening with mild posterior spurring at C2-C3 level.  There is mild disc space flattening with mild anterior and mild posterior spurring at C3-C4 level.  Moderate disc space flattening with mild anterior and mild posterior spurring at C4-C5 and C6-C7 level.  Mild disc space flattening with mild anterior spurring at C5-C6 level.  No prevertebral soft tissue swelling.  Cervical airway is patent.  There is no pneumothorax in visualized lung apices.  Impression:  1.  No acute fracture or  subluxation.  Degenerative changes as described above.   Original Report Authenticated By: Natasha Mead, M.D.   Ct Cervical Spine Wo Contrast  01/12/2013   *RADIOLOGY REPORT*  Clinical Data: Fall, head injury The  CT HEAD WITHOUT CONTRAST,CT CERVICAL SPINE WITHOUT CONTRAST  Technique:  Contiguous axial images were obtained from the base of the skull through the vertex without contrast.,Technique: Multidetector CT imaging of the cervical spine was performed. Multiplanar CT image reconstructions were also generated.  Comparison: None.  Findings: No skull fracture is noted. The mastoid air cells are unremarkable.  No intracranial hemorrhage, mass effect or midline shift. Mild mucosal thickening bilateral posterior aspect of the maxillary sinus.  No acute infarction. No mass lesion is noted on this unenhanced scan.  Bilateral basal ganglia punctate calcifications are noted.  Mild cerebral atrophy.  Periventricular and patchy subcortical white matter decreased attenuation probable due to chronic small vessel ischemic changes.  IMPRESSION: No acute intracranial abnormality.  Mild cerebral atrophy. Periventricular and subcortical white matter decreased attenuation probable due to chronic small vessel ischemic changes.  CT cervical spine without IV contrast:  Axial images of the cervical spine shows no acute fracture or subluxation.  Computer processed images shows mild degenerative changes C1-C2 articulation.  Minimal disc space flattening with mild posterior spurring at C2-C3 level.  There is mild disc space flattening with mild anterior and mild posterior spurring at C3-C4 level.  Moderate disc space flattening with mild anterior and mild posterior spurring at C4-C5 and C6-C7 level.  Mild disc space flattening with mild anterior spurring at C5-C6 level.  No prevertebral soft tissue swelling.  Cervical airway is patent.  There is no pneumothorax in visualized lung apices.  Impression:  1.  No acute fracture or subluxation.   Degenerative changes as described above.   Original Report Authenticated By: Natasha Mead, M.D.   Mr Brain Wo Contrast  01/17/2013   *RADIOLOGY REPORT*  Clinical Data: Question seizure.  Syncope and fall.  Diabetic.  MRI HEAD WITHOUT CONTRAST  Technique:  Multiplanar, multiecho pulse sequences of the brain and surrounding structures were obtained according to standard protocol without intravenous contrast.  Comparison: 01/15/2013.  Findings: No acute infarct.  Tiny area of blood breakdown products posterior left temporal lobe, left frontal lobe and right occipital lobe may represent result of prior hemorrhagic ischemia although result of prior trauma not entirely excluded.  Broad-based (5 mm thickness) left hemispheric subdural collection with cerebrospinal fluid intensity suggestive of cystic hygroma ( versus chronic subdural hematoma).  Prominent small vessel disease type changes.  Global atrophy without hydrocephalus.  No intracranial mass lesion detected on this unenhanced exam.  Major intracranial vascular structures are patent.  Paranasal sinus mucosal thickening.  Cervical spondylotic changes with spinal stenosis and slight cord flattening C3-4 and left so at the C2-3 level.  Cervical medullary junction, pituitary region, pineal region and orbital structures unremarkable.  IMPRESSION: No acute infarct.  Tiny area of blood breakdown products posterior left temporal lobe, left frontal lobe and right occipital lobe may represent result of prior hemorrhagic ischemia although result of prior trauma not entirely excluded.  Broad-based (5 mm thickness) left hemispheric subdural collection with cerebrospinal fluid intensity suggestive of cystic hygroma (versus chronic subdural hematoma).  Prominent small vessel disease type changes.  Global atrophy without hydrocephalus.  Paranasal sinus mucosal thickening.  Cervical spondylotic changes with spinal stenosis and slight cord flattening C3-4 and left so at the C2-3  level.   Original Report Authenticated By: Lacy Duverney, M.D.   Ir Fluoro Guide Cv Line Left  01/15/2013   *RADIOLOGY REPORT*  Indication: 79 year old with end-stage renal disease. Recently infiltrated right upper arm fistula.  The patient needs hemodialysis and a catheter.  PROCEDURE(S): FLUOROSCOPIC AND ULTRASOUND GUIDED PLACEMENT OF A TUNNELED DIAYSIS CATHETER  Physician:  Rachelle Hora. Lowella Dandy, MD  Medications: Ancef 2gm. Versed 2 mg, Fentanyl 100 mcg. A radiology nurse monitored the patient for moderate sedation.  As antibiotic prophylaxis, Ancef  was ordered pre-procedure and administered intravenously within one hour of incision.  Moderate sedation time: 38 minutes  Fluoroscopy time: 3 minutes and 8 seconds  Procedure:Informed consent was obtained for placement of a tunneled dialysis catheter.  The patient was placed supine on the interventional table.  Ultrasound confirmed a patent left internal jugularvein.  Ultrasound images were obtained for documentation. The left side of the neck was prepped and draped in a sterile fashion.  The left side of the neck was anesthetized with 1% lidocaine.  Maximal barrier sterile technique was utilized including caps, mask, sterile gowns, sterile gloves, sterile drape, hand hygiene and skin antiseptic.  A small incision was made with #11 blade scalpel.  A 21 gauge needle directed into the left internal jugular vein with ultrasound guidance.  A micropuncture dilator set was placed.  A 27 cm tip to cuff HemoSplit catheter was selected.  The skin below the left clavicle was anesthetized and a small incision was made with an #11 blade scalpel.  A subcutaneous tunnel was formed to the vein dermatotomy site.  The catheter was brought through the tunnel.  The vein dermatotomy site was dilated to accommodate a peel-away sheath.  The catheter was placed through the peel-away sheath and directed into the central venous structures.  The tip of the catheter was placed in the upper right  atrium with fluoroscopy.  Fluoroscopic images were obtained for documentation.  Both lumens were found to aspirate and flush well. The proper amount of heparin was flushed in both lumens.  The vein dermatotomy site was closed using a single layer of absorbable suture and Dermabond.  The catheter was secured to the skin using Prolene suture.  Findings:Catheter tip in the upper right atrium.  Complications: None  Impression:Successful placement of a left jugular tunneled dialysis catheter using ultrasound and fluoroscopic guidance.   Original Report Authenticated By: Richarda Overlie, M.D.   Ir US Guide Vasc  Access Left  01/15/2013   *RADIOLOGY REPORT*  Indication: 79 year old with end-stage renal disease. Recently infiltrated right upper arm fistula.  The patient needs hemodialysis and a catheter.  PROCEDURE(S): FLUOROSCOPIC AND ULTRASOUND GUIDED PLACEMENT OF A TUNNELED DIAYSIS CATHETER  Physician:  Rachelle Hora. Lowella Dandy, MD  Medications: Ancef 2gm. Versed 2 mg, Fentanyl 100 mcg. A radiology nurse monitored the patient for moderate sedation.  As antibiotic prophylaxis, Ancef  was ordered pre-procedure and administered intravenously within one hour of incision.  Moderate sedation time: 38 minutes  Fluoroscopy time: 3 minutes and 8 seconds  Procedure:Informed consent was obtained for placement of a tunneled dialysis catheter.  The patient was placed supine on the interventional table.  Ultrasound confirmed a patent left internal jugularvein.  Ultrasound images were obtained for documentation. The left side of the neck was prepped and draped in a sterile fashion.  The left side of the neck was anesthetized with 1% lidocaine.  Maximal barrier sterile technique was utilized including caps, mask, sterile gowns, sterile gloves, sterile drape, hand hygiene and skin antiseptic.  A small incision was made with #11 blade scalpel.  A 21 gauge needle directed into the left internal jugular vein with ultrasound guidance.  A micropuncture  dilator set was placed.  A 27 cm tip to cuff HemoSplit catheter was selected.  The skin below the left clavicle was anesthetized and a small incision was made with an #11 blade scalpel.  A subcutaneous tunnel was formed to the vein dermatotomy site.  The catheter was brought through the tunnel.  The vein dermatotomy site was dilated to accommodate a peel-away sheath.  The catheter was placed through the peel-away sheath and directed into the central venous structures.  The tip of the catheter was placed in the upper right atrium with fluoroscopy.  Fluoroscopic images were obtained for documentation.  Both lumens were found to aspirate and flush well. The proper amount of heparin was flushed in both lumens.  The vein dermatotomy site was closed using a single layer of absorbable suture and Dermabond.  The catheter was secured to the skin using Prolene suture.  Findings:Catheter tip in the upper right atrium.  Complications: None  Impression:Successful placement of a left jugular tunneled dialysis catheter using ultrasound and fluoroscopic guidance.   Original Report Authenticated By: Richarda Overlie, M.D.   Dg Chest Port 1 View  01/12/2013   *RADIOLOGY REPORT*  Clinical Data: Unresponsive, recent injury  PORTABLE CHEST - 1 VIEW  Comparison: 04/16/2010  Findings: Cardiac shadow is mildly enlarged.  Mild vascular congestion is seen.  No focal confluent infiltrate is noted.  No bony abnormality is noted.  IMPRESSION: Mild Vascular congestion.   Original Report Authenticated By: Alcide Clever, M.D.    Microbiology: Recent Results (from the past 240 hour(s))  CULTURE, BLOOD (ROUTINE X 2)     Status: None   Collection Time    01/15/13  6:20 PM      Result Value Range Status   Specimen Description BLOOD HAND LEFT   Final   Special Requests BOTTLES DRAWN AEROBIC AND ANAEROBIC 10CC   Final   Culture  Setup Time 01/16/2013 00:45   Final   Culture     Final   Value:        BLOOD CULTURE RECEIVED NO GROWTH TO DATE CULTURE  WILL BE HELD FOR 5 DAYS BEFORE ISSUING A FINAL NEGATIVE REPORT   Report Status PENDING   Incomplete  CULTURE, BLOOD (ROUTINE X 2)     Status:  None   Collection Time    01/15/13  6:30 PM      Result Value Range Status   Specimen Description BLOOD ARM LEFT   Final   Special Requests BOTTLES DRAWN AEROBIC ONLY 10CC   Final   Culture  Setup Time 01/16/2013 00:44   Final   Culture     Final   Value:        BLOOD CULTURE RECEIVED NO GROWTH TO DATE CULTURE WILL BE HELD FOR 5 DAYS BEFORE ISSUING A FINAL NEGATIVE REPORT   Report Status PENDING   Incomplete     Labs: Basic Metabolic Panel:  Recent Labs Lab 01/12/13 1230 01/12/13 1910 01/13/13 0205 01/13/13 0728 01/15/13 1900 01/17/13 0647 01/18/13 0656  NA 139  --  141  --  138 138 135  K 5.0  --  4.2  --  4.9 4.4 4.2  CL 103  --  105  --  102 101 95*  CO2 16*  --  21  --  18* 24 22  GLUCOSE 173*  --  154*  --  242* 143* 165*  BUN 146*  --  110*  --  116* 81* 52*  CREATININE 10.77* 8.49* 9.28*  --  10.92* 8.99* 6.83*  CALCIUM 6.3*  --  7.0*  --  7.1* 7.6* 7.2*  PHOS  --   --   --  4.8*  --  5.7* 5.5*   Liver Function Tests:  Recent Labs Lab 01/13/13 0205 01/17/13 0647 01/18/13 0656  AST 14  --   --   ALT 8  --   --   ALKPHOS 57  --   --   BILITOT 0.4  --   --   PROT 6.8  --   --   ALBUMIN 3.7 2.9* 2.9*   No results found for this basename: LIPASE, AMYLASE,  in the last 168 hours No results found for this basename: AMMONIA,  in the last 168 hours CBC:  Recent Labs Lab 01/12/13 1230 01/12/13 1910 01/13/13 0205 01/15/13 1900 01/17/13 0639 01/18/13 0656  WBC 7.7 6.8 4.8 10.0 8.0 7.9  NEUTROABS 6.7  --   --   --   --   --   HGB 10.7* 10.4* 10.0* 9.4* 8.3* 8.5*  HCT 31.9* 29.6* 29.5* 27.3* 24.3* 25.4*  MCV 93.5 92.2 93.1 93.2 92.0 92.7  PLT 134* 122* 118* 122* 122* 142*   Cardiac Enzymes:  Recent Labs Lab 01/12/13 1910 01/13/13 0205 01/13/13 0728  TROPONINI <0.30 <0.30 <0.30   BNP: BNP (last 3  results) No results found for this basename: PROBNP,  in the last 8760 hours CBG:  Recent Labs Lab 01/17/13 1157 01/17/13 1603 01/17/13 1729 01/17/13 2157 01/18/13 1109  GLUCAP 175* 366* 365* 268* 198*       Signed:  Sheilla Maris  [facility] 01/18/2013, 4:37 PM

## 2013-01-18 NOTE — Progress Notes (Signed)
Clinical Social Work Department BRIEF PSYCHOSOCIAL ASSESSMENT 01/18/2013  Patient:  Andrew [name]     Account Number:  [id number]     Admit date:  01/12/2013  Clinical Social Worker:  Margaree Mackintosh  Date/Time:  01/18/2013 02:45 PM  Referred by:  Physician  Date Referred:  01/18/2013 Referred for  SNF Placement   Other Referral:   Interview type:  Family Other interview type:    PSYCHOSOCIAL DATA Living Status:  FAMILY Admitted from facility:   Level of care:   Primary support name:  Andrew [name]: [phone] Primary support relationship to patient:  SPOUSE Degree of support available:   Adequate.    CURRENT CONCERNS Current Concerns  Post-Acute Placement   Other Concerns:    SOCIAL WORK ASSESSMENT / PLAN Clinical Social Worker received referral for potential SNF palcement.  CSW reviewed chart and noted pt has trasferred to 6700.  CSW spoke with pt's family; family is not interested in SNF.  Wife states she has used [facility] for [facility] in the past and she would like to use them at this dc.  Wife states she is available by phone if she is not in pt's room.  Wife also provided consent to speak with pt's son, Andrew [name], to assist with dc planning.  CSW updated RNCM and Unit CSW.  CSW to sign off, please re consult if needed.   Assessment/plan status:  Information/Referral to [facility] Other assessment/ plan:   Information/referral to community resources:   SNF  CIR  [facility] [name]    PATIENT'S/FAMILY'S RESPONSE TO PLAN OF CARE: Wife thanked CSW for intervention.

## 2013-01-19 ENCOUNTER — Encounter (HOSPITAL_COMMUNITY): Payer: Medicare Other

## 2013-01-19 ENCOUNTER — Other Ambulatory Visit (HOSPITAL_COMMUNITY): Payer: Self-pay | Admitting: *Deleted

## 2013-01-19 NOTE — Progress Notes (Signed)
Patient arrived via w/c at 1100, accompanied by son. Stated that he is now on dialysis. Called [facility] office and spoke with [employer]. Order received to d/c Procrit therapy. Patient and son verbalize understanding.

## 2013-01-22 LAB — CULTURE, BLOOD (ROUTINE X 2): Culture: NO GROWTH

## 2013-01-23 ENCOUNTER — Emergency Department (HOSPITAL_COMMUNITY)
Admission: EM | Admit: 2013-01-23 | Discharge: 2013-01-24 | Disposition: A | Discharge status: Still In Hospital | Payer: Medicare Other | Attending: Emergency Medicine | Admitting: Emergency Medicine

## 2013-01-23 ENCOUNTER — Encounter (HOSPITAL_COMMUNITY): Payer: Self-pay | Admitting: Emergency Medicine

## 2013-01-23 DIAGNOSIS — T829XXA Unspecified complication of cardiac and vascular prosthetic device, implant and graft, initial encounter: Secondary | ICD-10-CM

## 2013-01-23 DIAGNOSIS — E119 Type 2 diabetes mellitus without complications: Secondary | ICD-10-CM | POA: Insufficient documentation

## 2013-01-23 DIAGNOSIS — N186 End stage renal disease: Secondary | ICD-10-CM | POA: Insufficient documentation

## 2013-01-23 DIAGNOSIS — T82898A Other specified complication of vascular prosthetic devices, implants and grafts, initial encounter: Secondary | ICD-10-CM | POA: Insufficient documentation

## 2013-01-23 DIAGNOSIS — Z992 Dependence on renal dialysis: Secondary | ICD-10-CM

## 2013-01-23 DIAGNOSIS — I120 Hypertensive chronic kidney disease with stage 5 chronic kidney disease or end stage renal disease: Secondary | ICD-10-CM | POA: Insufficient documentation

## 2013-01-23 NOTE — ED Notes (Signed)
Pt. Received dialysis this afternoon, states finished around 1600. At 2200 pt. States dialysis catheter started bleeding. Dressing applied.

## 2013-01-23 NOTE — ED Notes (Signed)
PT. REPORTS BLEEDING AT DIALYSIS CATHETER THIS EVENING , HEMODIALYSIS Q TUES/THURS/SAT .

## 2013-01-23 NOTE — ED Provider Notes (Signed)
History     CSN: [id number]  Arrival date & time 01/23/13  2210   First MD Initiated Contact with Patient 01/23/13 2337      Chief Complaint  Patient presents with  . Vascular Access Problem    (Consider location/radiation/quality/duration/timing/severity/associated sxs/prior treatment) HPI Comments: Patient with history of ESRD recently started hemodialysis last week due to worsening renal function.  Had left IJ Perm Cath placed by Interventional Radiology.  Tonight he was at home and he felt something wet on his chest and noticed he was bleeding from the dialysis catheter site.  He did not bump it and there was no trauma to the area.  Bleeding controlled with direct pressure.    The history is provided by the patient.    Past Medical History  Diagnosis Date  . Hypertension   . Diabetes mellitus without complication   . Arthritis   . Renal disorder     chronic renal disease  . History of blood transfusion   . GI bleed   . History of blood transfusion     Past Surgical History  Procedure Laterality Date  . Esophagogastroduodenoscopy  10/03/2012    Procedure: ESOPHAGOGASTRODUODENOSCOPY (EGD);  Surgeon: Theda Belfast, MD;  Location: [facility] ENDOSCOPY;  Service: Endoscopy;  Laterality: N/A;  . Colonoscopy  10/04/2012    Procedure: COLONOSCOPY;  Surgeon: Theda Belfast, MD;  Location: [facility] ENDOSCOPY;  Service: Endoscopy;  Laterality: N/A;  . Colonoscopy with esophagogastroduodenoscopy (egd) Left 11/30/2012    Procedure: COLONOSCOPY WITH ESOPHAGOGASTRODUODENOSCOPY (EGD);  Surgeon: Theda Belfast, MD;  Location: [facility] ENDOSCOPY;  Service: Endoscopy;  Laterality: Left;    Family History  Problem Relation Age of Onset  . Sudden death Mother   . Sudden death Father     History  Substance Use Topics  . Smoking status: Never Smoker   . Smokeless tobacco: Not on file  . Alcohol Use: No      Review of Systems  All other systems reviewed and are negative.    Allergies  Review of  patient's allergies indicates no known allergies.  Home Medications   Current Outpatient Rx  Name  Route  Sig  Dispense  Refill  . allopurinol (ZYLOPRIM) 100 MG tablet   Oral   Take 100 mg by mouth 2 (two) times a week. Monday and Thursday         . amLODipine (NORVASC) 10 MG tablet   Oral   Take 10 mg by mouth Daily.          . calcium carbonate (TUMS - DOSED IN MG ELEMENTAL CALCIUM) 500 MG chewable tablet   Oral   Chew 1 tablet (200 mg of elemental calcium total) by mouth 3 (three) times daily with meals.   90 tablet   0   . diphenhydramine-acetaminophen (TYLENOL PM) 25-500 MG TABS   Oral   Take 1 tablet by mouth at bedtime as needed (for pain).         Marland Kitchen divalproex (DEPAKOTE) 250 MG DR tablet   Oral   Take 3 tablets (750 mg total) by mouth every 12 (twelve) hours.   60 tablet   0   . pantoprazole (PROTONIX) 20 MG tablet   Oral   Take 20 mg by mouth 2 (two) times daily.           BP 145/70  Pulse 90  Temp(Src) 98.5 F (36.9 C) (Oral)  Resp 16  SpO2 100%  Physical Exam  Nursing note and vitals  reviewed. Constitutional: He is oriented to person, place, and time. He appears well-developed and well-nourished. No distress.  HENT:  Head: Normocephalic and atraumatic.  Mouth/Throat: Oropharynx is clear and moist.  Neck: Normal range of motion. Neck supple.  Cardiovascular: Normal rate.   No murmur heard. Pulmonary/Chest: Effort normal and breath sounds normal.  Abdominal: Soft. Bowel sounds are normal.  Musculoskeletal: Normal range of motion.  Neurological: He is alert and oriented to person, place, and time.  Skin: Skin is warm and dry. He is not diaphoretic.  The catheter site at the left IJ is noted to have a dressing in place.  This was removed and there is a clot around the catheter itself.  There is slight bleeding coming from around the catheter site but no active bleeding.    ED Course  Procedures (including critical care time)  Labs Reviewed   CBC WITH DIFFERENTIAL  BASIC METABOLIC PANEL  PROTIME-INR  APTT   No results found.   No diagnosis found.    MDM  This patient presents with complaints of bleeding around his dialysis catheter.  There was a significant amount of blood on his chest and clothing and has continued to ooze in the ED.  I have tried to apply pressure manually and with a bag of saline, thrombipad, but he continues to saturate the dressings.  I have spoken with Dr. Archer Asa from interventional radiology who will see the patient in the ED first thing in the AM when he arrives here.          Geoffery Lyons, MD 01/24/13 2019

## 2013-01-24 ENCOUNTER — Emergency Department (HOSPITAL_COMMUNITY): Payer: Medicare Other

## 2013-01-24 LAB — CBC WITH DIFFERENTIAL/PLATELET
Basophils Absolute: 0 10*3/uL (ref 0.0–0.1)
Basophils Relative: 0 % (ref 0–1)
Lymphocytes Relative: 13 % (ref 12–46)
MCHC: 33.3 g/dL (ref 30.0–36.0)
Monocytes Absolute: 0.7 10*3/uL (ref 0.1–1.0)
Neutro Abs: 4.1 10*3/uL (ref 1.7–7.7)
Neutrophils Relative %: 72 % (ref 43–77)
Platelets: 162 10*3/uL (ref 150–400)
RDW: 15.5 % (ref 11.5–15.5)
WBC: 5.7 10*3/uL (ref 4.0–10.5)

## 2013-01-24 LAB — BASIC METABOLIC PANEL
BUN: 54 mg/dL — ABNORMAL HIGH (ref 6–23)
CO2: 26 mEq/L (ref 19–32)
Chloride: 92 mEq/L — ABNORMAL LOW (ref 96–112)
Creatinine, Ser: 5.09 mg/dL — ABNORMAL HIGH (ref 0.50–1.35)
GFR calc Af Amer: 11 mL/min — ABNORMAL LOW (ref >90)
Potassium: 4.1 mEq/L (ref 3.5–5.1)

## 2013-01-24 LAB — APTT: aPTT: 35 seconds (ref 24–37)

## 2013-01-24 LAB — PROTIME-INR: Prothrombin Time: 13.3 seconds (ref 11.6–15.2)

## 2013-01-24 MED ORDER — GELATIN ABSORBABLE 12-7 MM EX MISC
CUTANEOUS | Status: AC
  Filled 2013-01-24: qty 1

## 2013-01-24 MED ORDER — CEFAZOLIN SODIUM-DEXTROSE 2-3 GM-% IV SOLR: 2.0000 g | Freq: Once | INTRAVENOUS | Status: DC

## 2013-01-24 MED ORDER — HEPARIN SODIUM (PORCINE) 1000 UNIT/ML IJ SOLN
INTRAMUSCULAR | Status: AC
  Filled 2013-01-24: qty 1

## 2013-01-24 MED ORDER — CHLORHEXIDINE GLUCONATE 4 % EX LIQD
CUTANEOUS | Status: AC
  Filled 2013-01-24: qty 30

## 2013-01-24 MED ORDER — CEFAZOLIN SODIUM-DEXTROSE 2-3 GM-% IV SOLR
INTRAVENOUS | Status: AC
  Administered 2013-01-24: 2000 mg
  Filled 2013-01-24: qty 50

## 2013-01-24 MED ORDER — "THROMBI-PAD 3""X3"" EX PADS"
1.0000 | MEDICATED_PAD | Freq: Once | CUTANEOUS | Status: AC
  Administered 2013-01-24: 1 via TOPICAL

## 2013-01-24 NOTE — Consult Note (Signed)
Interventional Radiology Consult Note  Reason for Consult: Spontaneous bleeding at permcatheter exit site Referring Physician: Dr. Judd Lien, ER Physician   HPI: [name] is an 79 y.o. male  With ESRD on hemodialysis via a recently placed (01/15/13) left IJ approach permcatheter. He was sitting at home last night in his usual state of health when he felt warm wetness on his chest.  He reports that he was bleeding briskly from around his catheter exit site and came into the ER for evaluation.  He was evaluated in the ER and pressure dressings were applied with limited success.  IR was called in consultation.   Upon my arrival, the bleeding has stopped, however the external portion of the catheter is completely encased in a 1cm thick rind of fresh thrombus.  The overlying pressure dressing was saturated with blood consistent with the reported history of significant oozing.    [name] is currently feeling well and denies any pain or discomfort. He denies history of trauma to the site or tension on the catheter itself.  He also denies warmth, redness, fever, chills or other systemic signs of infection.     Past Medical History:  Past Medical History  Diagnosis Date  . Hypertension   . Diabetes mellitus without complication   . Arthritis   . Renal disorder     chronic renal disease  . History of blood transfusion   . GI bleed   . History of blood transfusion     Surgical History:  Past Surgical History  Procedure Laterality Date  . Esophagogastroduodenoscopy  10/03/2012    Procedure: ESOPHAGOGASTRODUODENOSCOPY (EGD);  Surgeon: Theda Belfast, MD;  Location: [facility] ENDOSCOPY;  Service: Endoscopy;  Laterality: N/A;  . Colonoscopy  10/04/2012    Procedure: COLONOSCOPY;  Surgeon: Theda Belfast, MD;  Location: [facility] ENDOSCOPY;  Service: Endoscopy;  Laterality: N/A;  . Colonoscopy with esophagogastroduodenoscopy (egd) Left 11/30/2012    Procedure: COLONOSCOPY WITH ESOPHAGOGASTRODUODENOSCOPY (EGD);   Surgeon: Theda Belfast, MD;  Location: [facility] ENDOSCOPY;  Service: Endoscopy;  Laterality: Left;    Family History:  Family History  Problem Relation Age of Onset  . Sudden death Mother   . Sudden death Father     Social History:  reports that he has never smoked. He does not have any smokeless tobacco history on file. He reports that he does not drink alcohol or use illicit drugs.  Allergies: No Known Allergies  Medications: I have reviewed the patient's current medications.  ROS: See HPI for pertinent findings, otherwise complete 10 system review negative.  Physical Exam: Blood pressure 145/70, pulse 90, temperature 98.5 F (36.9 C), temperature source Oral, resp. rate 16, SpO2 100.00%.  Left neck and chest:  Mild superficial ecchymosis over the left pectoral region.  Catheter completely encased in fresh thrombus.  No TTP or fluctuance along catheter tract.  By palpation, it feels like the cuff remains secured in the soft tissue tunnel.   Labs: CBC  Recent Labs  01/24/13 0111  WBC 5.7  HGB 8.5*  HCT 25.5*  PLT 162   MET  Recent Labs  01/24/13 0111  NA 133*  K 4.1  CL 92*  CO2 26  GLUCOSE 235*  BUN 54*  CREATININE 5.09*  CALCIUM 7.4*   No results found for this basename: PROT, ALBUMIN, AST, ALT, ALKPHOS, BILITOT, BILIDIR, IBILI, LIPASE,  in the last 72 hours PT/INR  Recent Labs  01/24/13 0111  LABPROT 13.3  INR 1.02   ABG No results  found for this basename: PHART, PCO2, PO2, HCO3,  in the last 72 hours    Dg Chest 2 View  01/24/2013   *RADIOLOGY REPORT*  Clinical Data: Excessive bleeding from dialysis catheter site.  CHEST - 2 VIEW  Comparison: 01/22/2013.  Findings: The diatek catheter is in good position without complicating features.  The cardiac silhouette, mediastinal and hilar contours are normal.  The lungs are clear.  No pleural effusion.  The bony thorax is intact.  IMPRESSION: Well-positioned diatek catheter without complicating features. No  acute pulmonary findings.   Original Report Authenticated By: Rudie Meyer, M.D.    Assessment/Plan: 79 yo male with spontaneous bleeding from recently placed left IJ permcatheter.  No evidence of coagulopathy and no reported history of trauma.  Source of the spontaneous bleed is unclear.   The bleeding has ceased, but the external portion of the catheter is completely encased in fresh thrombus.  Disruption of this thrombus at the bedside could lead to recurrent bleeding.  Additionally, the thrombus poses a potential nidus for infection.   We will bring the patient back to IR this morning to clean the catheter under sterile conditions and evaluate for the source of bleeding.  He will likely require a catheter exchange with gel foam packing of the soft tissue tract.  If infection is suspected after the catheter is cleaned and evaluated, a new soft tissue tunnel may be required.  Signed,  Sterling Big, MD Vascular & Interventional Radiologist [facility] Radiology

## 2013-01-24 NOTE — Procedures (Signed)
Successful fluoroscopic guided exchange of existing left jugular approach dialysis catheter.  Catheter is ready for immediate use.  No immediate post procedural complications.   

## 2013-01-24 NOTE — Progress Notes (Signed)
Patient ID: [name], male   DOB: [birth year], 79 y.o.   MRN: [id number] Pt seen earlier this am by Dr. Archer Asa regarding bleeding from left IJ hemodialysis cath site(see note for full details)Pt now scheduled for cath exchange. Exam: pt awake/alert; chest- CTA bilat; heart- RRR; abd- soft,+BS,NT; ext - FROM, no edema. Left IJ cath entry site with overlying clotted blood, NT.  Details of cath exchange d/w pt with his understanding and consent.   Filed Vitals:   01/24/13 0630 01/24/13 0645 01/24/13 0700 01/24/13 0730  BP: 147/49 147/49 136/56 144/69  Pulse: 81 78 75 74  Temp:      TempSrc:      Resp:      SpO2: 100% 100% 99% 98%

## 2013-01-24 NOTE — ED Notes (Signed)
MD at bedside to look at dialysis catheter

## 2013-01-24 NOTE — ED Notes (Signed)
Pt. Transferred to Interventional Radiology, The nurses will be discharging the pt. From IR.  Belonging walked over and given to pt.

## 2013-02-09 ENCOUNTER — Encounter: Payer: Self-pay | Admitting: Diagnostic Neuroimaging

## 2013-02-09 ENCOUNTER — Ambulatory Visit (INDEPENDENT_AMBULATORY_CARE_PROVIDER_SITE_OTHER): Payer: Medicare Other | Admitting: Diagnostic Neuroimaging

## 2013-02-09 VITALS — BP 128/61 | HR 72 | Temp 97.3°F | Ht 72.0 in | Wt 202.0 lb

## 2013-02-09 DIAGNOSIS — R569 Unspecified convulsions: Secondary | ICD-10-CM

## 2013-02-09 MED ORDER — DIVALPROEX SODIUM 250 MG PO DR TAB: 750.0000 mg | DELAYED_RELEASE_TABLET | Freq: Two times a day (BID) | ORAL | Status: DC

## 2013-02-09 NOTE — Progress Notes (Signed)
[facility]  PATIENT: [name] DOB: 09-06-1932  REFERRING CLINICIAN: Calvert Cantor, MD HISTORY FROM: patient, son, wife, records REASON FOR VISIT:  consult   HISTORICAL  CHIEF COMPLAINT:  Chief Complaint  Patient presents with  . Seizures    #7 NP    HISTORY OF PRESENT ILLNESS:  79 y.o. male with a Past Medical History of ESRD, on dialysis x 3 weeks, diabetes, hypertension, recent history of GI bleeding from arteriovenous malformations.  On 01/12/13 was in the bathroom at home he sustained a syncopal event. His wife was downstairs, she heard him fall and ran upstairs, she found him on the bathroom floor wedged between the wall and the commode. He was apparently awake but lethargic and somewhat confused. She then called EMS,upon EMS arrival patient started slowly coming around, he was then brought to the emergency room for further evaluation. While in the emergency room patient completely regained his usual mentation. CT of the head and C-spine was found to be negative, patient sustained a small laceration above his left eye.  Son reports that patient had second witnessed seizure in the hospital, where he had convulsions and LOC.  He was incontinent of urine without tongue-biting.  The episode lasted a few minutes.  Per the history obtained, patient has been in his usual state of health over the past few days to weeks. He does have some unsteady gait, but has not been excessively lethargic nor has he had any issues with nausea vomiting.Patient does not have a history of seizures in the past. He had not been on hemodialysis prior to hospitalization although he has had several grafts placed in anticipation of starting HD. Patient now gets HD Tues, Thurs, Sat. Physical therapy on Monday, Wed. Friday.   REVIEW OF SYSTEMS: Full 14 system review of systems performed and notable only for anemia, cough, snoring, feeling cold, joint pain, joint swelling, allergies, runny nose,  weakness, seizure, and restless legs.  ALLERGIES: No Known Allergies  HOME MEDICATIONS: Outpatient Prescriptions Prior to Visit  Medication Sig Dispense Refill  . allopurinol (ZYLOPRIM) 100 MG tablet Take 100 mg by mouth 2 (two) times a week. Monday and Thursday      . amLODipine (NORVASC) 10 MG tablet Take 10 mg by mouth Daily.       . calcium carbonate (TUMS - DOSED IN MG ELEMENTAL CALCIUM) 500 MG chewable tablet Chew 1 tablet (200 mg of elemental calcium total) by mouth 3 (three) times daily with meals.  90 tablet  0  . diphenhydramine-acetaminophen (TYLENOL PM) 25-500 MG TABS Take 1 tablet by mouth at bedtime as needed (for pain).      Marland Kitchen divalproex (DEPAKOTE) 250 MG DR tablet Take 3 tablets (750 mg total) by mouth every 12 (twelve) hours.  60 tablet  0  . pantoprazole (PROTONIX) 20 MG tablet Take 40 mg by mouth 2 (two) times daily.        No facility-administered medications prior to visit.    PAST MEDICAL HISTORY: Past Medical History  Diagnosis Date  . Hypertension   . Diabetes mellitus without complication   . Arthritis   . Renal disorder     chronic renal disease  . History of blood transfusion   . GI bleed   . History of blood transfusion     PAST SURGICAL HISTORY: Past Surgical History  Procedure Laterality Date  . Esophagogastroduodenoscopy  10/03/2012    Procedure: ESOPHAGOGASTRODUODENOSCOPY (EGD);  Surgeon: Theda Belfast, MD;  Location: [facility] ENDOSCOPY;  Service: Endoscopy;  Laterality: N/A;  . Colonoscopy  10/04/2012    Procedure: COLONOSCOPY;  Surgeon: Theda Belfast, MD;  Location: [facility] ENDOSCOPY;  Service: Endoscopy;  Laterality: N/A;  . Colonoscopy with esophagogastroduodenoscopy (egd) Left 11/30/2012    Procedure: COLONOSCOPY WITH ESOPHAGOGASTRODUODENOSCOPY (EGD);  Surgeon: Theda Belfast, MD;  Location: [facility] ENDOSCOPY;  Service: Endoscopy;  Laterality: Left;    FAMILY HISTORY: Family History  Problem Relation Age of Onset  . Sudden death Mother   . Sudden  death Father     SOCIAL HISTORY:  History   Social History  . Marital Status: Married    Spouse Name: patricia    Number of Children: 2  . Years of Education: PHD   Occupational History  . Not on file.   Social History Main Topics  . Smoking status: Never Smoker   . Smokeless tobacco: Not on file  . Alcohol Use: No  . Drug Use: No  . Sexually Active: No   Other Topics Concern  . Not on file   Social History Narrative  . No narrative on file     PHYSICAL EXAM  Filed Vitals:   02/09/13 1051  BP: 128/61  Pulse: 72  Temp: 97.3 F (36.3 C)  TempSrc: Oral  Height: 6' (1.829 m)  Weight: 202 lb (91.627 kg)    Not recorded    Body mass index is 27.39 kg/(m^2).  GENERAL EXAM: Patient is in no distress  CARDIOVASCULAR: Regular rate and rhythm, no murmurs, no carotid bruits  NEUROLOGIC: MENTAL STATUS: awake, alert, language fluent, comprehension intact, naming intact CRANIAL NERVE: no papilledema on fundoscopic exam, pupils equal and reactive to light, visual fields full to confrontation, extraocular muscles intact, no nystagmus, facial sensation and strength symmetric, uvula midline, shoulder shrug symmetric, tongue midline. MOTOR: normal bulk and tone, full strength in the BUE, BLE SENSORY: normal and symmetric to light touch, pinprick, temperature, vibration  COORDINATION: finger-nose-finger, fine finger movements normal REFLEXES: deep tendon reflexes present and symmetric, ABSENT BILAT KNEE AND ANKLE GAIT/STATION: narrow based gait; UNSTEADY ON RISING, romberg is negative, GAIT ASSISTED BY ROLLING WALKER.   DIAGNOSTIC DATA (LABS, IMAGING, TESTING) - I reviewed patient records, labs, notes, testing and imaging myself where available.  Lab Results  Component Value Date   WBC 5.7 01/24/2013   HGB 8.5* 01/24/2013   HCT 25.5* 01/24/2013   MCV 93.1 01/24/2013   PLT 162 01/24/2013      Component Value Date/Time   NA 133* 01/24/2013 0111   K 4.1 01/24/2013 0111     CL 92* 01/24/2013 0111   CO2 26 01/24/2013 0111   GLUCOSE 235* 01/24/2013 0111   BUN 54* 01/24/2013 0111   CREATININE 5.09* 01/24/2013 0111   CALCIUM 7.4* 01/24/2013 0111   PROT 6.8 01/13/2013 0205   ALBUMIN 2.9* 01/18/2013 0656   AST 14 01/13/2013 0205   ALT 8 01/13/2013 0205   ALKPHOS 57 01/13/2013 0205   BILITOT 0.4 01/13/2013 0205   GFRNONAA 10* 01/24/2013 0111   GFRAA 11* 01/24/2013 0111   No results found for this basename: CHOL,  HDL,  LDLCALC,  LDLDIRECT,  TRIG,  CHOLHDL   Lab Results  Component Value Date   HGBA1C 5.4 12/29/2012   No results found for this basename: VITAMINB12   No results found for this basename: TSH   PTH  513                                                                  01/12/13 Valproic Acid               31.5                                                                 01/18/13  MRI HEAD WITHOUT CONTRAST 01/17/13: No acute infarct. Tiny area of blood breakdown products posterior left temporal lobe, left frontal lobe and right occipital lobe may represent result of prior hemorrhagic ischemia although result of prior trauma not entirely excluded. Broad-based (5 mm thickness) left hemispheric subdural collection with cerebrospinal fluid intensity suggestive of cystic hygroma (versus chronic subdural hematoma). Prominent small vessel disease type changes. Global atrophy without hydrocephalus. Paranasal sinus mucosal thickening. Cervical spondylotic changes with spinal stenosis and slight cord flattening C3-4 and left so at the C2-3 level.  EEG 01/17/13: This is an abnormal EEG secondary to general background slowing. Although this can be seen with drowse, cannot rule out the possibility of diffuse cerebral disturbance that is etiologically nonspecific but may include a metabolic encephalopathy among other possibilities. Also noted was rare sharp activity over the left hemisphere.  ASSESSMENT AND PLAN  79 y.o. year old right-handed male  has  a past medical history of Hypertension; Diabetes mellitus without complication; Arthritis; Renal disorder; History of blood transfusion; GI bleed; and History of blood transfusion. here with seizure x 2 in the setting of pericortical microhemorrhage in the left temporal lobe and left sided sharp waves on EEG. With these findings, patient should continue depakote.   PLAN: 1. Continue Depakote, 750mg  BID. 2. Advised patient no driving for at least 6 months seizure free; has other reasons not to drive as well (vision, gait, physical limitations) 3. Check Depakote Level at next dialysis 4. Follow up in 3-4 months.   Orders Placed This Encounter  Procedures  . Valproic Acid Level     Suanne Marker, MD 02/09/2013, 11:30 AM Certified in Neurology, Neurophysiology and Neuroimaging  [facility] [address], [address] [place], [place] [address] [phone]

## 2013-02-09 NOTE — Patient Instructions (Addendum)
Continue current medications. We will check depakote level today.  Seizure, Adult A seizure is abnormal electrical activity in the brain. Seizures can cause a change in attention or behavior (altered mental status). Seizures often involve uncontrollable shaking (convulsions). Seizures usually last from 30 seconds to 2 minutes. Epilepsy is a brain disorder in which a patient has repeated seizures over time. CAUSES  There are many different problems that can cause seizures. In some cases, no cause is found. Common causes of seizures include:  Head injuries.  Brain tumors.  Infections.  Imbalance of chemicals in the blood.  Kidney failure or liver failure.  Heart disease.  Drug abuse.  Stroke.  Withdrawal from certain drugs or alcohol.  Birth defects.  Malfunction of a neurosurgical device placed in the brain. SYMPTOMS  Symptoms vary depending on the part of the brain that is involved. Right before a seizure, you may have a warning (aura) that a seizure is about to occur. An aura may include the following symptoms:   Fear or anxiety.  Nausea.  Feeling like the room is spinning (vertigo).  Vision changes, such as seeing flashing lights or spots. Common symptoms during a seizure include:  Convulsions.  Drooling.  Rapid eye movements.  Grunting.  Loss of bladder and bowel control.  Bitter taste in the mouth. After a seizure, you may feel confused and sleepy. You may also have an injury resulting from convulsions during the seizure. DIAGNOSIS  Your caregiver will perform a physical exam and run some tests to determine the type and cause of your seizure. These tests may include:  Blood tests.  A lumbar puncture test. In this test, a small amount of fluid is removed from the spine and examined.  Electrocardiography (ECG). This test records the electrical activity in your heart.  Imaging tests, such as computed tomography (CT) scans or magnetic resonance imaging  (MRI).  Electroencephalography (EEG). This test records the electrical activity in your brain. TREATMENT  Seizures usually stop on their own. Treatment will depend on the cause of your seizure. In some cases, medicine may be given to prevent future seizures. HOME CARE INSTRUCTIONS   If you are given medicines, take them exactly as prescribed by your caregiver.  Keep all follow-up appointments as directed by your caregiver.  Do not swim or drive until your caregiver says it is okay.  Teach friends and family what to do if you have a seizure. They should:  Lay you on the ground to prevent a fall.  Put a cushion under your head.  Loosen any tight clothing around your neck.  Turn you on your side. If vomiting occurs, this helps keep your airway clear.  Stay with you until you recover. SEEK IMMEDIATE MEDICAL CARE IF:  The seizure lasts longer than 2 to 5 minutes.  The seizure is severe or the person does not wake up after the seizure.  The person has altered mental status. Drive the person to the emergency department or call your local emergency services (911 in [place] MAKE SURE YOU:  Understand these instructions.  Will watch your condition.  Will get help right away if you are not doing well or get worse. Document Released: 08/20/2000 Document Revised: 11/15/2011 Document Reviewed: 08/11/2011 [facility] Patient Information 2014 [place], [place].

## 2013-05-31 ENCOUNTER — Telehealth: Payer: Self-pay

## 2013-05-31 MED ORDER — DIVALPROEX SODIUM ER 500 MG PO TB24: 1000.0000 mg | ORAL_TABLET | Freq: Two times a day (BID) | ORAL | Status: DC

## 2013-05-31 NOTE — Telephone Encounter (Signed)
Pharmacy sent a fax saying Depakote DR (Delayed Release) is on back order.  They would like to know if they can dispense Depakote ER (Extended Release) instead.  Please advise.  Thank you.

## 2013-05-31 NOTE — Telephone Encounter (Signed)
I asked Dr. Marjory Lies, it is his patient, and he said ok but need to increase dose because ER version has less Bioavailabillty than the DR.  So I entered an order for Depakote ER 1000 mg BID. -LL

## 2013-07-11 ENCOUNTER — Telehealth: Payer: Self-pay | Admitting: *Deleted

## 2013-07-11 NOTE — Telephone Encounter (Signed)
Pt asked if diabetic shoes and inserts were in.  I said yes and asked if I could help him schedule an appt.  Pt states he has an appt on 07/13/2013.

## 2013-07-13 ENCOUNTER — Ambulatory Visit: Payer: Medicare Other

## 2013-07-23 ENCOUNTER — Telehealth: Payer: Self-pay | Admitting: *Deleted

## 2013-07-23 NOTE — Telephone Encounter (Signed)
Pt asked when appt is.  I informed him it was 07/24/2013 at 230pm.  Pt states it is a dialysis day and needs to reschedule, transferred to [employer].

## 2013-07-24 ENCOUNTER — Ambulatory Visit: Payer: Medicare Other

## 2013-07-25 NOTE — Telephone Encounter (Addendum)
Spoke with patient and he said that he had a CT /brain in 06/14, should he have another one done?

## 2013-07-25 NOTE — Telephone Encounter (Signed)
Spoke with patient and he said that he has been seizure free since last office visit on 06/14, would he be able to drive again?

## 2013-07-26 NOTE — Telephone Encounter (Signed)
Informed patient's wife per Dr Dohmeier's notes below, she understood and will give message to patient

## 2013-07-26 NOTE — Telephone Encounter (Signed)
Patient should be able to drive after 6 month being seizure free. CT scan only if recent seizure, MRI scan may be a different intent , to look for hippocampal sclerosis. Was he gettig an MRI or CT?

## 2013-08-15 ENCOUNTER — Ambulatory Visit: Payer: Medicare Other

## 2013-09-17 ENCOUNTER — Other Ambulatory Visit: Payer: Self-pay | Admitting: Specialist

## 2013-09-17 DIAGNOSIS — R29898 Other symptoms and signs involving the musculoskeletal system: Secondary | ICD-10-CM

## 2013-09-25 ENCOUNTER — Emergency Department (HOSPITAL_COMMUNITY): Payer: Medicare Other

## 2013-09-25 ENCOUNTER — Other Ambulatory Visit: Payer: Medicare Other

## 2013-09-25 ENCOUNTER — Inpatient Hospital Stay (HOSPITAL_COMMUNITY)
Admission: EM | Admit: 2013-09-25 | Discharge: 2013-09-29 | DRG: 947 | Disposition: A | Discharge status: Home Health | Payer: Medicare Other | Attending: Internal Medicine | Admitting: Internal Medicine

## 2013-09-25 ENCOUNTER — Encounter (HOSPITAL_COMMUNITY): Payer: Self-pay | Admitting: Emergency Medicine

## 2013-09-25 DIAGNOSIS — D631 Anemia in chronic kidney disease: Secondary | ICD-10-CM

## 2013-09-25 DIAGNOSIS — I10 Essential (primary) hypertension: Secondary | ICD-10-CM

## 2013-09-25 DIAGNOSIS — D696 Thrombocytopenia, unspecified: Secondary | ICD-10-CM

## 2013-09-25 DIAGNOSIS — N185 Chronic kidney disease, stage 5: Secondary | ICD-10-CM

## 2013-09-25 DIAGNOSIS — T426X1A Poisoning by other antiepileptic and sedative-hypnotic drugs, accidental (unintentional), initial encounter: Secondary | ICD-10-CM

## 2013-09-25 DIAGNOSIS — M949 Disorder of cartilage, unspecified: Secondary | ICD-10-CM

## 2013-09-25 DIAGNOSIS — N039 Chronic nephritic syndrome with unspecified morphologic changes: Secondary | ICD-10-CM

## 2013-09-25 DIAGNOSIS — R569 Unspecified convulsions: Secondary | ICD-10-CM

## 2013-09-25 DIAGNOSIS — N189 Chronic kidney disease, unspecified: Secondary | ICD-10-CM | POA: Diagnosis present

## 2013-09-25 DIAGNOSIS — Z79899 Other long term (current) drug therapy: Secondary | ICD-10-CM

## 2013-09-25 DIAGNOSIS — E872 Acidosis: Secondary | ICD-10-CM

## 2013-09-25 DIAGNOSIS — R4182 Altered mental status, unspecified: Principal | ICD-10-CM

## 2013-09-25 DIAGNOSIS — T4275XA Adverse effect of unspecified antiepileptic and sedative-hypnotic drugs, initial encounter: Secondary | ICD-10-CM | POA: Diagnosis present

## 2013-09-25 DIAGNOSIS — M109 Gout, unspecified: Secondary | ICD-10-CM

## 2013-09-25 DIAGNOSIS — R55 Syncope and collapse: Secondary | ICD-10-CM

## 2013-09-25 DIAGNOSIS — M899 Disorder of bone, unspecified: Secondary | ICD-10-CM | POA: Diagnosis present

## 2013-09-25 DIAGNOSIS — E11649 Type 2 diabetes mellitus with hypoglycemia without coma: Secondary | ICD-10-CM

## 2013-09-25 DIAGNOSIS — K922 Gastrointestinal hemorrhage, unspecified: Secondary | ICD-10-CM

## 2013-09-25 DIAGNOSIS — E119 Type 2 diabetes mellitus without complications: Secondary | ICD-10-CM

## 2013-09-25 DIAGNOSIS — G40909 Epilepsy, unspecified, not intractable, without status epilepticus: Secondary | ICD-10-CM | POA: Diagnosis present

## 2013-09-25 DIAGNOSIS — N2581 Secondary hyperparathyroidism of renal origin: Secondary | ICD-10-CM

## 2013-09-25 DIAGNOSIS — Z992 Dependence on renal dialysis: Secondary | ICD-10-CM

## 2013-09-25 DIAGNOSIS — N186 End stage renal disease: Secondary | ICD-10-CM

## 2013-09-25 DIAGNOSIS — E8729 Other acidosis: Secondary | ICD-10-CM

## 2013-09-25 DIAGNOSIS — K5521 Angiodysplasia of colon with hemorrhage: Secondary | ICD-10-CM

## 2013-09-25 LAB — COMPREHENSIVE METABOLIC PANEL
ALT: 5 U/L (ref 0–53)
AST: 13 U/L (ref 0–37)
Albumin: 3.4 g/dL — ABNORMAL LOW (ref 3.5–5.2)
Alkaline Phosphatase: 40 U/L (ref 39–117)
BUN: 29 mg/dL — AB (ref 6–23)
CALCIUM: 8.7 mg/dL (ref 8.4–10.5)
CO2: 27 mEq/L (ref 19–32)
Chloride: 93 mEq/L — ABNORMAL LOW (ref 96–112)
Creatinine, Ser: 4.29 mg/dL — ABNORMAL HIGH (ref 0.50–1.35)
GFR calc non Af Amer: 12 mL/min — ABNORMAL LOW (ref >90)
GFR, EST AFRICAN AMERICAN: 14 mL/min — AB (ref >90)
GLUCOSE: 135 mg/dL — AB (ref 70–99)
Potassium: 3.6 mEq/L — ABNORMAL LOW (ref 3.7–5.3)
SODIUM: 137 meq/L (ref 137–147)
TOTAL PROTEIN: 7.4 g/dL (ref 6.0–8.3)
Total Bilirubin: 0.3 mg/dL (ref 0.3–1.2)

## 2013-09-25 LAB — URINE MICROSCOPIC-ADD ON

## 2013-09-25 LAB — URINALYSIS, ROUTINE W REFLEX MICROSCOPIC
Bilirubin Urine: NEGATIVE
Glucose, UA: NEGATIVE mg/dL
Ketones, ur: NEGATIVE mg/dL
LEUKOCYTES UA: NEGATIVE
NITRITE: NEGATIVE
PH: 7 (ref 5.0–8.0)
Protein, ur: 100 mg/dL — AB
SPECIFIC GRAVITY, URINE: 1.013 (ref 1.005–1.030)
Urobilinogen, UA: 0.2 mg/dL (ref 0.0–1.0)

## 2013-09-25 LAB — CBC
HCT: 31.4 % — ABNORMAL LOW (ref 39.0–52.0)
HEMOGLOBIN: 10.6 g/dL — AB (ref 13.0–17.0)
MCH: 35.7 pg — AB (ref 26.0–34.0)
MCHC: 33.8 g/dL (ref 30.0–36.0)
MCV: 105.7 fL — ABNORMAL HIGH (ref 78.0–100.0)
Platelets: 82 10*3/uL — ABNORMAL LOW (ref 150–400)
RBC: 2.97 MIL/uL — ABNORMAL LOW (ref 4.22–5.81)
RDW: 13.8 % (ref 11.5–15.5)
WBC: 3.8 10*3/uL — ABNORMAL LOW (ref 4.0–10.5)

## 2013-09-25 LAB — GLUCOSE, CAPILLARY: Glucose-Capillary: 125 mg/dL — ABNORMAL HIGH (ref 70–99)

## 2013-09-25 LAB — VALPROIC ACID LEVEL: Valproic Acid Lvl: 107.5 ug/mL — ABNORMAL HIGH (ref 50.0–100.0)

## 2013-09-25 LAB — LACTIC ACID, PLASMA: LACTIC ACID, VENOUS: 2.2 mmol/L (ref 0.5–2.2)

## 2013-09-25 MED ORDER — ALLOPURINOL 100 MG PO TABS
100.0000 mg | ORAL_TABLET | ORAL | Status: DC
  Administered 2013-09-27: 100 mg via ORAL
  Filled 2013-09-25: qty 1

## 2013-09-25 MED ORDER — CALCIUM CARBONATE ANTACID 500 MG PO CHEW
1.0000 | CHEWABLE_TABLET | Freq: Three times a day (TID) | ORAL | Status: DC
  Administered 2013-09-26 – 2013-09-29 (×9): 200 mg via ORAL
  Filled 2013-09-25 (×14): qty 1

## 2013-09-25 MED ORDER — HEPARIN SODIUM (PORCINE) 5000 UNIT/ML IJ SOLN
5000.0000 [IU] | Freq: Three times a day (TID) | INTRAMUSCULAR | Status: DC
  Administered 2013-09-26 – 2013-09-27 (×4): 5000 [IU] via SUBCUTANEOUS
  Filled 2013-09-25 (×7): qty 1

## 2013-09-25 MED ORDER — AMLODIPINE BESYLATE 10 MG PO TABS
10.0000 mg | ORAL_TABLET | Freq: Every day | ORAL | Status: DC
  Administered 2013-09-26 – 2013-09-28 (×2): 10 mg via ORAL
  Filled 2013-09-25 (×4): qty 1

## 2013-09-25 MED ORDER — PANTOPRAZOLE SODIUM 40 MG PO TBEC
40.0000 mg | DELAYED_RELEASE_TABLET | Freq: Two times a day (BID) | ORAL | Status: DC
  Administered 2013-09-26 – 2013-09-28 (×6): 40 mg via ORAL
  Filled 2013-09-25 (×5): qty 1

## 2013-09-25 MED ORDER — IRBESARTAN 300 MG PO TABS
300.0000 mg | ORAL_TABLET | Freq: Every day | ORAL | Status: DC
Start: 2013-09-26 — End: 2013-09-29
  Administered 2013-09-26 – 2013-09-28 (×2): 300 mg via ORAL
  Filled 2013-09-25 (×4): qty 1

## 2013-09-25 MED ORDER — SODIUM CHLORIDE 0.9 % IV SOLN
INTRAVENOUS | Status: DC
Start: 2013-09-25 — End: 2013-09-26
  Administered 2013-09-25 – 2013-09-26 (×2): via INTRAVENOUS

## 2013-09-25 NOTE — ED Notes (Signed)
Pt from home c/o weakness, slow to respond. Per EMS, Pt was having delayed responses prior to dialysis today. Pt AxO x 2. Answers question appropriately. VS stable, nsd, denies pain. EMS stroke screen was negative.

## 2013-09-25 NOTE — ED Notes (Signed)
[name] [phone]

## 2013-09-25 NOTE — ED Provider Notes (Signed)
CSN: [id number]     Arrival date & time 09/25/13  1853 History   First MD Initiated Contact with Patient 09/25/13 1855     Chief Complaint  Patient presents with  . Altered Mental Status   (Consider location/radiation/quality/duration/timing/severity/associated sxs/prior Treatment) HPI  LEVEL 5 CAVEAT- AMS  80 y.o. male with a Past Medical History of ESRD, on dialysis x 3 weeks, seizures, diabetes, hypertension, recent history of GI bleeding from arteriovenous malformations.  Sent to the ER by EMS for altered mental status. Do not know who called EMS. Pt came from home. Family says he was slow today before diabetes as well but continues to worsen. Pt is lethargic. Will open eyes to verbal stimuli. Not answering questions.  CBG 125 in traige   Past Medical History  Diagnosis Date  . Hypertension   . Diabetes mellitus without complication   . Arthritis   . Renal disorder     chronic renal disease  . History of blood transfusion   . GI bleed   . History of blood transfusion    Past Surgical History  Procedure Laterality Date  . Esophagogastroduodenoscopy  10/03/2012    Procedure: ESOPHAGOGASTRODUODENOSCOPY (EGD);  Surgeon: Theda Belfast, MD;  Location: [facility] ENDOSCOPY;  Service: Endoscopy;  Laterality: N/A;  . Colonoscopy  10/04/2012    Procedure: COLONOSCOPY;  Surgeon: Theda Belfast, MD;  Location: [facility] ENDOSCOPY;  Service: Endoscopy;  Laterality: N/A;  . Colonoscopy with esophagogastroduodenoscopy (egd) Left 11/30/2012    Procedure: COLONOSCOPY WITH ESOPHAGOGASTRODUODENOSCOPY (EGD);  Surgeon: Theda Belfast, MD;  Location: [facility] ENDOSCOPY;  Service: Endoscopy;  Laterality: Left;   Family History  Problem Relation Age of Onset  . Sudden death Mother   . Sudden death Father    History  Substance Use Topics  . Smoking status: Never Smoker   . Smokeless tobacco: Not on file  . Alcohol Use: No    Review of Systems LEVEL 5 CAVEAT- AMS  Allergies  Review of patient's allergies  indicates no known allergies.  Home Medications   Current Outpatient Rx  Name  Route  Sig  Dispense  Refill  . allopurinol (ZYLOPRIM) 100 MG tablet   Oral   Take 100 mg by mouth 2 (two) times a week. Monday and Thursday         . amLODipine (NORVASC) 10 MG tablet   Oral   Take 10 mg by mouth Daily.          . calcium carbonate (TUMS - DOSED IN MG ELEMENTAL CALCIUM) 500 MG chewable tablet   Oral   Chew 1 tablet (200 mg of elemental calcium total) by mouth 3 (three) times daily with meals.   90 tablet   0   . diphenhydramine-acetaminophen (TYLENOL PM) 25-500 MG TABS   Oral   Take 1 tablet by mouth at bedtime as needed (for pain).         Marland Kitchen divalproex (DEPAKOTE ER) 500 MG 24 hr tablet   Oral   Take 2 tablets (1,000 mg total) by mouth every 12 (twelve) hours.   120 tablet   5   . MICARDIS 80 MG tablet               . pantoprazole (PROTONIX) 20 MG tablet   Oral   Take 40 mg by mouth 2 (two) times daily.           BP 142/60  Pulse 72  Temp(Src) 98.3 F (36.8 C) (Oral)  Resp 16  SpO2 100% Physical Exam  Nursing note and vitals reviewed. Constitutional: He appears well-developed and well-nourished. He appears lethargic.  HENT:  Head: Normocephalic and atraumatic.  Right Ear: External ear normal.  Left Ear: External ear normal.  Eyes: Pupils are equal, round, and reactive to light.  Neck: Normal range of motion. Neck supple. No muscular tenderness present.  Cardiovascular: Normal rate and regular rhythm.   Pulmonary/Chest: Effort normal.  Abdominal: Soft. He exhibits no distension. There is no tenderness.  Neurological: He appears lethargic.  Unable to obtain neuro exam. Pt AMS does not follow-up commands and does not respond to questions. Laying still, will look at me when i speak to him.  Skin: Skin is warm and dry.    ED Course  Procedures (including critical care time) Labs Review Labs Reviewed  CBC - Abnormal; Notable for the following:    WBC  3.8 (*)    RBC 2.97 (*)    Hemoglobin 10.6 (*)    HCT 31.4 (*)    MCV 105.7 (*)    MCH 35.7 (*)    Platelets 82 (*)    All other components within normal limits  COMPREHENSIVE METABOLIC PANEL - Abnormal; Notable for the following:    Potassium 3.6 (*)    Chloride 93 (*)    Glucose, Bld 135 (*)    BUN 29 (*)    Creatinine, Ser 4.29 (*)    Albumin 3.4 (*)    GFR calc non Af Amer 12 (*)    GFR calc Af Amer 14 (*)    All other components within normal limits  URINALYSIS, ROUTINE W REFLEX MICROSCOPIC - Abnormal; Notable for the following:    Hgb urine dipstick SMALL (*)    Protein, ur 100 (*)    All other components within normal limits  GLUCOSE, CAPILLARY - Abnormal; Notable for the following:    Glucose-Capillary 125 (*)    All other components within normal limits  VALPROIC ACID LEVEL - Abnormal; Notable for the following:    Valproic Acid Lvl 107.5 (*)    All other components within normal limits  LACTIC ACID, PLASMA  URINE MICROSCOPIC-ADD ON  TROPONIN I  PRO B NATRIURETIC PEPTIDE  URINE RAPID DRUG SCREEN (HOSP PERFORMED)  CBC  BASIC METABOLIC PANEL  TYPE AND SCREEN   Imaging Review Dg Chest 2 View  09/25/2013   CLINICAL DATA:  Altered mental status.  EXAM: CHEST  2 VIEW  COMPARISON:  01/24/2013  FINDINGS: Two views of the chest were obtained. Mild fullness in the right peritracheal region is unchanged. Heart size is normal. No evidence for airspace disease or edema. No evidence for pleural effusions.  IMPRESSION: No acute chest findings.   Electronically Signed   By: Richarda Overlie M.D.   On: 09/25/2013 21:37   Ct Head Wo Contrast  09/25/2013   CLINICAL DATA:  Altered level of consciousness. Altered mental status.  EXAM: CT HEAD WITHOUT CONTRAST  TECHNIQUE: Contiguous axial images were obtained from the base of the skull through the vertex without intravenous contrast.  COMPARISON:  01/15/2013  FINDINGS: There is no evidence of intracranial hemorrhage, brain edema, or other  signs of acute infarction. There is no evidence of intracranial mass lesion or mass effect. No abnormal extraaxial fluid collections are identified.  Mild cerebral atrophy is stable. Extensive chronic small vessel disease is also unchanged in appearance. Ventricles are stable in size. No evidence of skull fracture or other significant bone abnormality.  IMPRESSION: No acute intracranial  findings.  Stable chronic small vessel disease and mild cerebral atrophy.   Electronically Signed   By: Myles Rosenthal M.D.   On: 09/25/2013 20:04    EKG Interpretation    Date/Time:  Tuesday September 25 2013 18:58:10 EST Ventricular Rate:  81 PR Interval:  140 QRS Duration: 87 QT Interval:  395 QTC Calculation: 458 R Axis:   49 Text Interpretation:  Age not entered, assumed to be  80 years old for purpose of ECG interpretation Sinus rhythm Borderline repolarization abnormality No significant change since last tracing Confirmed by ZACKOWSKI  MD, SCOTT ([id number]) on 09/25/2013 7:04:20 PM            MDM   1. Altered mental status   2. CKD (chronic kidney disease) stage 5, GFR less than 15 ml/min   3. Seizures     Patients lab work-up does not show any findings to indicate why he is altered. His work-up is negative. Family is here and they said that he is normally very sharp and quick. They say that since this morning he has been unable to answer questions and has been not following commands and acting abnormally.   I spoke with Lyda Perone, MD, [facility] who has agreed to admit the patient. He asks for a neurology consult. I have spoken with Dr. Cyril Mourning who will come see the patient.  Dr. Cyril Mourning feels that he is having seizure activity. Will continue to follow-up patient. Patients admitting orders placed by Dr. Julian Reil.  Dorthula Matas, PA-C 09/26/13 [phone]

## 2013-09-25 NOTE — H&P (Addendum)
[facility] History and Physical  Olon Russ EOF:[id number] DOB: [birth year] DOA: 09/25/2013  Referring physician: EDP PCP: Cecille Aver, MD   Chief Complaint: AMS   HPI: [name] is a 80 y.o. male who is brought in by his family for AMS.  Patient had been acting abnormal before dialysis today.  Essentially he is AAOx3, but per family has significant personality changes.  Patient intermittently answering questions but states he hasn't noticed any difference.  Review of Systems: unable to perform due to AMS.  Past Medical History  Diagnosis Date  . Hypertension   . Diabetes mellitus without complication   . Arthritis   . Renal disorder     chronic renal disease  . History of blood transfusion   . GI bleed   . History of blood transfusion    Past Surgical History  Procedure Laterality Date  . Esophagogastroduodenoscopy  10/03/2012    Procedure: ESOPHAGOGASTRODUODENOSCOPY (EGD);  Surgeon: Theda Belfast, MD;  Location: [facility] ENDOSCOPY;  Service: Endoscopy;  Laterality: N/A;  . Colonoscopy  10/04/2012    Procedure: COLONOSCOPY;  Surgeon: Theda Belfast, MD;  Location: [facility] ENDOSCOPY;  Service: Endoscopy;  Laterality: N/A;  . Colonoscopy with esophagogastroduodenoscopy (egd) Left 11/30/2012    Procedure: COLONOSCOPY WITH ESOPHAGOGASTRODUODENOSCOPY (EGD);  Surgeon: Theda Belfast, MD;  Location: [facility] ENDOSCOPY;  Service: Endoscopy;  Laterality: Left;   Social History:  reports that he has never smoked. He does not have any smokeless tobacco history on file. He reports that he does not drink alcohol or use illicit drugs.  No Known Allergies  Family History  Problem Relation Age of Onset  . Sudden death Mother   . Sudden death Father      Prior to Admission medications   Medication Sig Start Date End Date Taking? Authorizing Provider  allopurinol (ZYLOPRIM) 100 MG tablet Take 100 mg by mouth 2 (two) times a week. Monday and Thursday 10/08/12   Leatha Gilding, MD   amLODipine (NORVASC) 10 MG tablet Take 10 mg by mouth Daily.  08/15/12   Historical Provider, MD  calcium carbonate (TUMS - DOSED IN MG ELEMENTAL CALCIUM) 500 MG chewable tablet Chew 1 tablet (200 mg of elemental calcium total) by mouth 3 (three) times daily with meals. 12/01/12   Shanker Levora Dredge, MD  diphenhydramine-acetaminophen (TYLENOL PM) 25-500 MG TABS Take 1 tablet by mouth at bedtime as needed (for pain).    Historical Provider, MD  divalproex (DEPAKOTE ER) 500 MG 24 hr tablet Take 2 tablets (1,000 mg total) by mouth every 12 (twelve) hours. 05/31/13   Ronal Fear, NP  MICARDIS 80 MG tablet  11/13/12   Historical Provider, MD  pantoprazole (PROTONIX) 20 MG tablet Take 40 mg by mouth 2 (two) times daily.  10/08/12   Costin Otelia Sergeant, MD   Physical Exam: Filed Vitals:   09/25/13 2227  BP: 143/64  Pulse: 77  Temp: 98.3 F (36.8 C)  Resp: 16    BP 143/64  Pulse 77  Temp(Src) 98.3 F (36.8 C) (Oral)  Resp 16  SpO2 100%  General Appearance:    Alert, oriented, no distress, appears stated age  Head:    Normocephalic, atraumatic  Eyes:    PERRL, EOMI, sclera non-icteric        Nose:   Nares without drainage or epistaxis. Mucosa, turbinates normal  Throat:   Moist mucous membranes. Oropharynx without erythema or exudate.  Neck:   Supple. No carotid bruits.  No thyromegaly.  No  lymphadenopathy.   Back:     No CVA tenderness, no spinal tenderness  Lungs:     Clear to auscultation bilaterally, without wheezes, rhonchi or rales  Chest wall:    No tenderness to palpitation  Heart:    Regular rate and rhythm without murmurs, gallops, rubs  Abdomen:     Soft, non-tender, nondistended, normal bowel sounds, no organomegaly  Genitalia:    deferred  Rectal:    deferred  Extremities:   No clubbing, cyanosis or edema.  Pulses:   2+ and symmetric all extremities  Skin:   Skin color, texture, turgor normal, no rashes or lesions  Lymph nodes:   Cervical, supraclavicular, and axillary nodes  normal  Neurologic:   No focal findings, however patient has a very flat affect, stares off in one direction.    Labs on Admission:  Basic Metabolic Panel:  Recent Labs Lab 09/25/13 1900  NA 137  K 3.6*  CL 93*  CO2 27  GLUCOSE 135*  BUN 29*  CREATININE 4.29*  CALCIUM 8.7   Liver Function Tests:  Recent Labs Lab 09/25/13 1900  AST 13  ALT 5  ALKPHOS 40  BILITOT 0.3  PROT 7.4  ALBUMIN 3.4*   No results found for this basename: LIPASE, AMYLASE,  in the last 168 hours No results found for this basename: AMMONIA,  in the last 168 hours CBC:  Recent Labs Lab 09/25/13 1900  WBC 3.8*  HGB 10.6*  HCT 31.4*  MCV 105.7*  PLT 82*   Cardiac Enzymes: No results found for this basename: CKTOTAL, CKMB, CKMBINDEX, TROPONINI,  in the last 168 hours  BNP (last 3 results) No results found for this basename: PROBNP,  in the last 8760 hours CBG:  Recent Labs Lab 09/25/13 1903  GLUCAP 125*    Radiological Exams on Admission: Dg Chest 2 View  09/25/2013   CLINICAL DATA:  Altered mental status.  EXAM: CHEST  2 VIEW  COMPARISON:  01/24/2013  FINDINGS: Two views of the chest were obtained. Mild fullness in the right peritracheal region is unchanged. Heart size is normal. No evidence for airspace disease or edema. No evidence for pleural effusions.  IMPRESSION: No acute chest findings.   Electronically Signed   By: Richarda Overlie M.D.   On: 09/25/2013 21:37   Ct Head Wo Contrast  09/25/2013   CLINICAL DATA:  Altered level of consciousness. Altered mental status.  EXAM: CT HEAD WITHOUT CONTRAST  TECHNIQUE: Contiguous axial images were obtained from the base of the skull through the vertex without intravenous contrast.  COMPARISON:  01/15/2013  FINDINGS: There is no evidence of intracranial hemorrhage, brain edema, or other signs of acute infarction. There is no evidence of intracranial mass lesion or mass effect. No abnormal extraaxial fluid collections are identified.  Mild cerebral  atrophy is stable. Extensive chronic small vessel disease is also unchanged in appearance. Ventricles are stable in size. No evidence of skull fracture or other significant bone abnormality.  IMPRESSION: No acute intracranial findings.  Stable chronic small vessel disease and mild cerebral atrophy.   Electronically Signed   By: Myles Rosenthal M.D.   On: 09/25/2013 20:04    EKG: Independently reviewed.  Assessment/Plan Principal Problem:   Altered mental status   1. AMS - patient appears to have very suppressed affect, suspicious of possible frontal lobe involvement, DDX is broad and includes, frontal lobe seizure, vascular event (stroke), infectious etiology, and toxic etiology.  Neurology en route to evaluate patient at  this time, further recs pending per them.  Anticipate holding VPA for at least one dose as his VPA level was slightly elevated this evening at 107, anticipate MRI, may need LP and empiric HSV coverage but will defer this decision to neurology who is coming to see patient.  Doubt TTP as patients thrombocytopenia is very mild, and his HGB is actually better than his baseline normally runs (highest since May of last year actually). 2. ESRD - dialysis earlier today, anticipate dialysis needed on Thursday, call nephrology if patient still in hospital at that time. 3. H/o DM - does not appear to be on any home meds for this, will check CBG q4h and cover if indicated 4. H/o Seizure disorder, see #1 above, supra-theraputic VPA right now.    Code Status: Full Code  Family Communication: No family in room Disposition Plan: Admit to inpatient   Time spent: 70 min  Ajanay Farve M. [facility] Pager [phone]  If 7AM-7PM, please contact the day team taking care of the patient [email or web address] Password [email or web address] 09/25/2013, 11:20 PM

## 2013-09-26 ENCOUNTER — Inpatient Hospital Stay: Admission: RE | Admit: 2013-09-26 | Payer: Medicare Other | Source: Ambulatory Visit

## 2013-09-26 ENCOUNTER — Inpatient Hospital Stay (HOSPITAL_COMMUNITY): Payer: Medicare Other

## 2013-09-26 ENCOUNTER — Encounter (HOSPITAL_COMMUNITY): Payer: Self-pay | Admitting: *Deleted

## 2013-09-26 DIAGNOSIS — E119 Type 2 diabetes mellitus without complications: Secondary | ICD-10-CM

## 2013-09-26 DIAGNOSIS — N186 End stage renal disease: Secondary | ICD-10-CM

## 2013-09-26 LAB — BASIC METABOLIC PANEL
BUN: 36 mg/dL — ABNORMAL HIGH (ref 6–23)
CALCIUM: 8.3 mg/dL — AB (ref 8.4–10.5)
CO2: 25 meq/L (ref 19–32)
Chloride: 97 mEq/L (ref 96–112)
Creatinine, Ser: 5.33 mg/dL — ABNORMAL HIGH (ref 0.50–1.35)
GFR calc Af Amer: 11 mL/min — ABNORMAL LOW (ref >90)
GFR, EST NON AFRICAN AMERICAN: 9 mL/min — AB (ref >90)
Glucose, Bld: 94 mg/dL (ref 70–99)
Potassium: 3.8 mEq/L (ref 3.7–5.3)
SODIUM: 139 meq/L (ref 137–147)

## 2013-09-26 LAB — CBC
HCT: 30 % — ABNORMAL LOW (ref 39.0–52.0)
Hemoglobin: 10 g/dL — ABNORMAL LOW (ref 13.0–17.0)
MCH: 35.2 pg — AB (ref 26.0–34.0)
MCHC: 33.3 g/dL (ref 30.0–36.0)
MCV: 105.6 fL — ABNORMAL HIGH (ref 78.0–100.0)
Platelets: 80 10*3/uL — ABNORMAL LOW (ref 150–400)
RBC: 2.84 MIL/uL — ABNORMAL LOW (ref 4.22–5.81)
RDW: 13.6 % (ref 11.5–15.5)
WBC: 2.9 10*3/uL — ABNORMAL LOW (ref 4.0–10.5)

## 2013-09-26 LAB — GLUCOSE, CAPILLARY
GLUCOSE-CAPILLARY: 102 mg/dL — AB (ref 70–99)
GLUCOSE-CAPILLARY: 98 mg/dL (ref 70–99)
Glucose-Capillary: 124 mg/dL — ABNORMAL HIGH (ref 70–99)
Glucose-Capillary: 94 mg/dL (ref 70–99)

## 2013-09-26 MED ORDER — SODIUM CHLORIDE 0.9 % IV SOLN
200.0000 mg | Freq: Once | INTRAVENOUS | Status: AC
  Administered 2013-09-26: 200 mg via INTRAVENOUS
  Filled 2013-09-26: qty 20

## 2013-09-26 MED ORDER — SODIUM CHLORIDE 0.9 % IV SOLN
100.0000 mg | Freq: Two times a day (BID) | INTRAVENOUS | Status: DC
  Administered 2013-09-27 (×2): 100 mg via INTRAVENOUS
  Filled 2013-09-26 (×6): qty 10

## 2013-09-26 MED ORDER — INSULIN ASPART 100 UNIT/ML ~~LOC~~ SOLN
0.0000 [IU] | Freq: Three times a day (TID) | SUBCUTANEOUS | Status: DC
  Administered 2013-09-26: 1 [IU] via SUBCUTANEOUS
  Administered 2013-09-27 – 2013-09-28 (×3): 2 [IU] via SUBCUTANEOUS
  Administered 2013-09-29: 1 [IU] via SUBCUTANEOUS

## 2013-09-26 MED ORDER — INSULIN ASPART 100 UNIT/ML ~~LOC~~ SOLN
0.0000 [IU] | Freq: Every day | SUBCUTANEOUS | Status: DC
  Administered 2013-09-27: 2 [IU] via SUBCUTANEOUS

## 2013-09-26 NOTE — Progress Notes (Signed)
[facility] PROGRESS NOTE  [name] NFA:[id number] DOB: [birth year] DOA: 09/25/2013 PCP: Cecille Aver, MD  Assessment/Plan: AMS - ?CVAvs seizure -VPA -MRI -continuous EEG -neuro consult  ESRD - dialysis earlier today, anticipate dialysis needed on Thursday, will call nephrology if patient still in hospital at that time.  H/o DM - SSI  H/o Seizure disorder, see #1 above, supra-theraputic VPA right now.     Code Status: full Family Communication: patient/son [name] Disposition Plan:    Consultants:  neuro  Procedures:  Continuous EEG  Antibiotics:    HPI/Subjective: Patient is more awake per family Eating applesauce  Objective: Filed Vitals:   09/26/13 1000  BP: 139/58  Pulse: 67  Temp: 98.6 F (37 C)  Resp: 18    Intake/Output Summary (Last 24 hours) at 09/26/13 1037 Last data filed at 09/26/13 0700  Gross per 24 hour  Intake      0 ml  Output      0 ml  Net      0 ml   There were no vitals filed for this visit.  Exam:   General:  Awake- pleasant/cooperative  Cardiovascular: rrr  Respiratory: clear anterior  Abdomen: +BS, soft  Musculoskeletal: moves al 4 ext   Data Reviewed: Basic Metabolic Panel:  Recent Labs Lab 09/25/13 1900 09/26/13 0900  NA 137 139  K 3.6* 3.8  CL 93* 97  CO2 27 25  GLUCOSE 135* 94  BUN 29* 36*  CREATININE 4.29* 5.33*  CALCIUM 8.7 8.3*   Liver Function Tests:  Recent Labs Lab 09/25/13 1900  AST 13  ALT 5  ALKPHOS 40  BILITOT 0.3  PROT 7.4  ALBUMIN 3.4*   No results found for this basename: LIPASE, AMYLASE,  in the last 168 hours No results found for this basename: AMMONIA,  in the last 168 hours CBC:  Recent Labs Lab 09/25/13 1900 09/26/13 0900  WBC 3.8* 2.9*  HGB 10.6* 10.0*  HCT 31.4* 30.0*  MCV 105.7* 105.6*  PLT 82* 80*   Cardiac Enzymes: No results found for this basename: CKTOTAL, CKMB, CKMBINDEX, TROPONINI,  in the last 168 hours BNP (last 3 results) No  results found for this basename: PROBNP,  in the last 8760 hours CBG:  Recent Labs Lab 09/25/13 1903 09/26/13 0820  GLUCAP 125* 98    No results found for this or any previous visit (from the past 240 hour(s)).   Studies: Dg Chest 2 View  09/25/2013   CLINICAL DATA:  Altered mental status.  EXAM: CHEST  2 VIEW  COMPARISON:  01/24/2013  FINDINGS: Two views of the chest were obtained. Mild fullness in the right peritracheal region is unchanged. Heart size is normal. No evidence for airspace disease or edema. No evidence for pleural effusions.  IMPRESSION: No acute chest findings.   Electronically Signed   By: Richarda Overlie M.D.   On: 09/25/2013 21:37   Ct Head Wo Contrast  09/25/2013   CLINICAL DATA:  Altered level of consciousness. Altered mental status.  EXAM: CT HEAD WITHOUT CONTRAST  TECHNIQUE: Contiguous axial images were obtained from the base of the skull through the vertex without intravenous contrast.  COMPARISON:  01/15/2013  FINDINGS: There is no evidence of intracranial hemorrhage, brain edema, or other signs of acute infarction. There is no evidence of intracranial mass lesion or mass effect. No abnormal extraaxial fluid collections are identified.  Mild cerebral atrophy is stable. Extensive chronic small vessel disease is also unchanged in appearance. Ventricles are stable in  size. No evidence of skull fracture or other significant bone abnormality.  IMPRESSION: No acute intracranial findings.  Stable chronic small vessel disease and mild cerebral atrophy.   Electronically Signed   By: Myles Rosenthal M.D.   On: 09/25/2013 20:04    Scheduled Meds: . [START ON 09/27/2013] allopurinol  100 mg Oral 2 times weekly  . amLODipine  10 mg Oral Daily  . calcium carbonate  1 tablet Oral TID WC  . heparin  5,000 Units Subcutaneous Q8H  . irbesartan  300 mg Oral Daily  . [START ON 09/27/2013] lacosamide (VIMPAT) IV  100 mg Intravenous Q12H  . pantoprazole  40 mg Oral BID   Continuous  Infusions: . sodium chloride 50 mL/hr at 09/26/13 0258    Principal Problem:   Altered mental status Active Problems:   Seizures   Altered mental state    Time spent: 35 min    [name]  [facility] Pager [phone]. If 7PM-7AM, please contact night-coverage at [email or web address], password [facility] 09/26/2013, 10:37 AM  LOS: 1 day

## 2013-09-26 NOTE — Progress Notes (Signed)
UR complete.  Iyari Hagner RN, MSN 

## 2013-09-26 NOTE — Progress Notes (Signed)
Continuous LTM started- Day 1

## 2013-09-26 NOTE — Consult Note (Addendum)
NEURO HOSPITALIST CONSULT NOTE    Reason for Consult: altered mental status.  HPI:                                                                                                                                          [name] is an 80 y.o. 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 ED by family due to altered mental status. He had been acting abnormal before his dialysis today, and family aldo reported some personality changes. He is able to answer questions but then becomes inattentive for several seconds. When asked why he is here today he replies " because I had a seizure" but can not elaborate about it. He takes Depakote and his level was slightly supra-therapeutic today. During this assessment he displays nearly continuous lip smacking that family previously reported as something that he has been doing for quite some time. CT head here showed no acute abnormality. Serologies significant for Cr 4.29, mild hypokalemia.  Past Medical History  Diagnosis Date  . Hypertension   . Diabetes mellitus without complication   . Arthritis   . Renal disorder     chronic renal disease  . History of blood transfusion   . GI bleed   . History of blood transfusion     Past Surgical History  Procedure Laterality Date  . Esophagogastroduodenoscopy  10/03/2012    Procedure: ESOPHAGOGASTRODUODENOSCOPY (EGD);  Surgeon: Beryle Beams, MD;  Location: [facility] ENDOSCOPY;  Service: Endoscopy;  Laterality: N/A;  . Colonoscopy  10/04/2012    Procedure: COLONOSCOPY;  Surgeon: Beryle Beams, MD;  Location: [facility] ENDOSCOPY;  Service: Endoscopy;  Laterality: N/A;  . Colonoscopy with esophagogastroduodenoscopy (egd) Left 11/30/2012    Procedure: COLONOSCOPY WITH ESOPHAGOGASTRODUODENOSCOPY  (EGD);  Surgeon: Beryle Beams, MD;  Location: [facility] ENDOSCOPY;  Service: Endoscopy;  Laterality: Left;    Family History  Problem Relation Age of Onset  . Sudden death Mother   . Sudden death Father     Social History:  reports that he has never smoked. He does not have any smokeless tobacco history on file. He reports that he does not drink alcohol or use illicit drugs.  No Known Allergies  MEDICATIONS:  I have reviewed the patient's current medications.   ROS:                                                                                                                                       History obtained from the patient and chart review.  General ROS: negative for - chills, fever, night sweats, weight gain or weight loss Psychological ROS: negative for - behavioral disorder, hallucinations, mood swings or suicidal ideation Ophthalmic ROS: negative for - blurry vision, double vision, eye pain or loss of vision ENT ROS: negative for - epistaxis, nasal discharge, oral lesions, sore throat, tinnitus or vertigo Allergy and Immunology ROS: negative for - hives or itchy/watery eyes Hematological and Lymphatic ROS: negative for - , bruising or swollen lymph nodes Endocrine ROS: negative for - galactorrhea, hair pattern changes, polydipsia/polyuria or temperature intolerance Respiratory ROS: negative for - cough, hemoptysis, shortness of breath or wheezing Cardiovascular ROS: negative for - chest pain, dyspnea on exertion, edema or irregular heartbeat Gastrointestinal ROS: negative for - abdominal pain, diarrhea, hematemesis, nausea/vomiting or stool incontinence Genito-Urinary ROS: negative for - dysuria, hematuria, incontinence or urinary frequency/urgency Musculoskeletal ROS: negative for - joint swelling or muscular weakness Neurological ROS: as noted in  HPI Dermatological ROS: negative for rash and skin lesion changes   Physical exam: pleasant male in no apparent distress. Blood pressure 142/60, pulse 72, temperature 98.3 F (36.8 C), temperature source Oral, resp. rate 16, SpO2 100.00%. Head: normocephalic. Neck: supple, no bruits, no JVD. Cardiac: no murmurs. Lungs: clear. Abdomen: soft, no tender, no mass. Extremities: no edema.  Neurologic Examination:                                                                                                      Mental Status: Alert but intermittently inattentive, oriented to place and person. Limited verbal output but speech fluent without evidence of aphasia.  Able to follow 3 step commands intermittently. Cranial Nerves: II: Discs flat bilaterally; Visual fields grossly normal, pupils equal, round, reactive to light and accommodation III,IV, VI: ptosis not present, extra-ocular motions intact bilaterally V,VII: smile symmetric, facial light touch sensation normal bilaterally VIII: hearing normal bilaterally IX,X: gag reflex present XI: bilateral shoulder shrug XII: midline tongue extension without atrophy or fasciculations  Motor: Right : Upper extremity   5/5    Left:     Upper extremity   5/5  Lower extremity   5/5     Lower extremity   5/5 Tone  and bulk:normal tone throughout; no atrophy noted Sensory: Pinprick and light touch intact throughout, bilaterally Deep Tendon Reflexes:  Right: Upper Extremity   Left: Upper extremity   biceps (C-5 to C-6) 2/4   biceps (C-5 to C-6) 2/4 tricep (C7) 2/4    triceps (C7) 2/4 Brachioradialis (C6) 2/4  Brachioradialis (C6) 2/4  Lower Extremity Lower Extremity  quadriceps (L-2 to L-4) 2/4   quadriceps (L-2 to L-4) 2/4 Achilles (S1) 2/4   Achilles (S1) 2/4  Plantars: Right: downgoing   Left: downgoing Cerebellar: normal finger-to-nose,  heel-to-shin no tested. Gait:  No tested. CV: pulses palpable throughout    No results found for  this basename: cbc, bmp, coags, chol, tri, ldl, hga1c    Results for orders placed during the hospital encounter of 09/25/13 (from the past 48 hour(s))  CBC     Status: Abnormal   Collection Time    09/25/13  7:00 PM      Result Value Range   WBC 3.8 (*) 4.0 - 10.5 K/uL   RBC 2.97 (*) 4.22 - 5.81 MIL/uL   Hemoglobin 10.6 (*) 13.0 - 17.0 g/dL   HCT 31.4 (*) 39.0 - 52.0 %   MCV 105.7 (*) 78.0 - 100.0 fL   MCH 35.7 (*) 26.0 - 34.0 pg   MCHC 33.8  30.0 - 36.0 g/dL   RDW 13.8  11.5 - 15.5 %   Platelets 82 (*) 150 - 400 K/uL   Comment: SPECIMEN CHECKED FOR CLOTS     PLATELETS APPEAR DECREASED     PLATELET COUNT CONFIRMED BY SMEAR  COMPREHENSIVE METABOLIC PANEL     Status: Abnormal   Collection Time    09/25/13  7:00 PM      Result Value Range   Sodium 137  137 - 147 mEq/L   Potassium 3.6 (*) 3.7 - 5.3 mEq/L   Chloride 93 (*) 96 - 112 mEq/L   CO2 27  19 - 32 mEq/L   Glucose, Bld 135 (*) 70 - 99 mg/dL   BUN 29 (*) 6 - 23 mg/dL   Creatinine, Ser 4.29 (*) 0.50 - 1.35 mg/dL   Calcium 8.7  8.4 - 10.5 mg/dL   Total Protein 7.4  6.0 - 8.3 g/dL   Albumin 3.4 (*) 3.5 - 5.2 g/dL   AST 13  0 - 37 U/L   ALT 5  0 - 53 U/L   Alkaline Phosphatase 40  39 - 117 U/L   Total Bilirubin 0.3  0.3 - 1.2 mg/dL   GFR calc non Af Amer 12 (*) >90 mL/min   GFR calc Af Amer 14 (*) >90 mL/min   Comment: (NOTE)     The eGFR has been calculated using the CKD EPI equation.     This calculation has not been validated in all clinical situations.     eGFR's persistently <90 mL/min signify possible Chronic Kidney     Disease.  GLUCOSE, CAPILLARY     Status: Abnormal   Collection Time    09/25/13  7:03 PM      Result Value Range   Glucose-Capillary 125 (*) 70 - 99 mg/dL  VALPROIC ACID LEVEL     Status: Abnormal   Collection Time    09/25/13  7:36 PM      Result Value Range   Valproic Acid Lvl 107.5 (*) 50.0 - 100.0 ug/mL  LACTIC ACID, PLASMA     Status: None   Collection Time    09/25/13  7:45 PM  Result Value Range   Lactic Acid, Venous 2.2  0.5 - 2.2 mmol/L  URINALYSIS, ROUTINE W REFLEX MICROSCOPIC     Status: Abnormal   Collection Time    09/25/13  7:58 PM      Result Value Range   Color, Urine YELLOW  YELLOW   APPearance CLEAR  CLEAR   Specific Gravity, Urine 1.013  1.005 - 1.030   pH 7.0  5.0 - 8.0   Glucose, UA NEGATIVE  NEGATIVE mg/dL   Hgb urine dipstick SMALL (*) NEGATIVE   Bilirubin Urine NEGATIVE  NEGATIVE   Ketones, ur NEGATIVE  NEGATIVE mg/dL   Protein, ur 100 (*) NEGATIVE mg/dL   Urobilinogen, UA 0.2  0.0 - 1.0 mg/dL   Nitrite NEGATIVE  NEGATIVE   Leukocytes, UA NEGATIVE  NEGATIVE  URINE MICROSCOPIC-ADD ON     Status: None   Collection Time    09/25/13  7:58 PM      Result Value Range   Squamous Epithelial / LPF RARE  RARE   WBC, UA 0-2  <3 WBC/hpf   RBC / HPF 0-2  <3 RBC/hpf   Bacteria, UA RARE  RARE   Urine-Other AMORPHOUS URATES/PHOSPHATES    TYPE AND SCREEN     Status: None   Collection Time    09/25/13  8:10 PM      Result Value Range   ABO/RH(D) AB POS     Antibody Screen NEG     Sample Expiration 09/28/2013      Dg Chest 2 View  09/25/2013   CLINICAL DATA:  Altered mental status.  EXAM: CHEST  2 VIEW  COMPARISON:  01/24/2013  FINDINGS: Two views of the chest were obtained. Mild fullness in the right peritracheal region is unchanged. Heart size is normal. No evidence for airspace disease or edema. No evidence for pleural effusions.  IMPRESSION: No acute chest findings.   Electronically Signed   By: Markus Daft M.D.   On: 09/25/2013 21:37   Ct Head Wo Contrast  09/25/2013   CLINICAL DATA:  Altered level of consciousness. Altered mental status.  EXAM: CT HEAD WITHOUT CONTRAST  TECHNIQUE: Contiguous axial images were obtained from the base of the skull through the vertex without intravenous contrast.  COMPARISON:  01/15/2013  FINDINGS: There is no evidence of intracranial hemorrhage, brain edema, or other signs of acute infarction. There is no evidence  of intracranial mass lesion or mass effect. No abnormal extraaxial fluid collections are identified.  Mild cerebral atrophy is stable. Extensive chronic small vessel disease is also unchanged in appearance. Ventricles are stable in size. No evidence of skull fracture or other significant bone abnormality.  IMPRESSION: No acute intracranial findings.  Stable chronic small vessel disease and mild cerebral atrophy.   Electronically Signed   By: Earle Gell M.D.   On: 09/25/2013 20:04   Assessment/Plan: 80 y/o with fluctuating mental state changes and no lateralizing neurological findings on exam. 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 Acute metabolic encephalopathy also high in the differential. Cerebrovascular insult and CNS infection less likely, but will get MRI brain to assess prior areas of left cortical temporal micro hemorrhage. Marland Kitchen  VPA level slightly supra-therapeutic and will hold tonight. Will load with 200 mg IV vimpat and continue vimpat 100 mg BID. Continuous EEG. Will follow up.    Dorian Pod, MD 09/26/2013, 12:37 AM

## 2013-09-26 NOTE — Evaluation (Signed)
Clinical/Bedside Swallow Evaluation Patient Details  Name: [name] MRN: [id number] Date of Birth: 01/19/1932  Today's Date: 09/26/2013 Time: 0930-1005 SLP Time Calculation (min): 35 min  Past Medical History:  Past Medical History  Diagnosis Date  . Hypertension   . Diabetes mellitus without complication   . Arthritis   . Renal disorder     chronic renal disease  . History of blood transfusion   . GI bleed   . History of blood transfusion    Past Surgical History:  Past Surgical History  Procedure Laterality Date  . Esophagogastroduodenoscopy  10/03/2012    Procedure: ESOPHAGOGASTRODUODENOSCOPY (EGD);  Surgeon: Theda Belfast, MD;  Location: [facility] ENDOSCOPY;  Service: Endoscopy;  Laterality: N/A;  . Colonoscopy  10/04/2012    Procedure: COLONOSCOPY;  Surgeon: Theda Belfast, MD;  Location: [facility] ENDOSCOPY;  Service: Endoscopy;  Laterality: N/A;  . Colonoscopy with esophagogastroduodenoscopy (egd) Left 11/30/2012    Procedure: COLONOSCOPY WITH ESOPHAGOGASTRODUODENOSCOPY (EGD);  Surgeon: Theda Belfast, MD;  Location: [facility] ENDOSCOPY;  Service: Endoscopy;  Laterality: Left;   HPI:  80 year old male admitted 09/25/13 due to AMS.  BSE ordered to determine least restrictive diet.   Assessment / Plan / Recommendation Clinical Impression  Oral care completed with suction.  Oral motor strength and function appear to be within functional limits.  No overt s/s aspiration observed with any consistency tested.  Son present, and reports no history of swallowing difficulty.  Extended oral prep on puree and solid, which son reports is normal for pt.  Will begin soft diet with chopped meats (for energy conservation), thin liquids ok via straw.  Rec meds one at a time.  Pt will need assistance with feeding due to UE weakness/discoordination.  MRI pending - SLP to follow up for diet tolerance once results are available.    Aspiration Risk  Mild    Diet Recommendation Dysphagia 3 (Mechanical Soft);Thin  liquid (chop meats for energy conservation)   Liquid Administration via: Straw Medication Administration: Whole meds with liquid (1 @ a time) Supervision: Full supervision/cueing for compensatory strategies;Staff to assist with self feeding Compensations: Small sips/bites;Slow rate Postural Changes and/or Swallow Maneuvers: Seated upright 90 degrees    Other  Recommendations Oral Care Recommendations: Oral care BID   Follow Up Recommendations  24 hour supervision/assistance    Frequency and Duration min 1 x/week  1 week   Pertinent Vitals/Pain No pain reported    SLP Swallow Goals  Tolerate least restrictive diet without s/s aspiration   Swallow Study Prior Functional Status   No history of swallowing difficulty per son. Pt tolerating regular diet/thin liquids prior to admit.    General Date of Onset: 09/25/13 HPI: 80 year old male admitted 09/25/13 due to AMS.  BSE ordered to determine least restrictive diet. Type of Study: Bedside swallow evaluation Previous Swallow Assessment: none found Diet Prior to this Study: NPO Temperature Spikes Noted: No Respiratory Status: Room air History of Recent Intubation: No Behavior/Cognition: Alert;Cooperative;Hard of hearing;Pleasant mood Oral Cavity - Dentition: Adequate natural dentition Self-Feeding Abilities: Total assist Patient Positioning: Upright in bed Baseline Vocal Quality: Clear Volitional Cough: Weak Volitional Swallow: Able to elicit    Oral/Motor/Sensory Function Overall Oral Motor/Sensory Function: Appears within functional limits for tasks assessed   Ice Chips Ice chips: Not tested   Thin Liquid Thin Liquid: Within functional limits Presentation: Straw    Nectar Thick Nectar Thick Liquid: Not tested   Honey Thick Honey Thick Liquid: Not tested   Puree  Puree: Within functional limits Presentation: Spoon   Solid   GO    Solid: Within functional limits      [place] Deleo B. Murvin [facility],  CCC-SLP [phone]  Leigh Aurora 09/26/2013,10:17 AM

## 2013-09-27 ENCOUNTER — Inpatient Hospital Stay (HOSPITAL_COMMUNITY): Payer: Medicare Other

## 2013-09-27 DIAGNOSIS — I10 Essential (primary) hypertension: Secondary | ICD-10-CM

## 2013-09-27 DIAGNOSIS — N186 End stage renal disease: Secondary | ICD-10-CM | POA: Diagnosis present

## 2013-09-27 DIAGNOSIS — Z992 Dependence on renal dialysis: Secondary | ICD-10-CM | POA: Diagnosis present

## 2013-09-27 DIAGNOSIS — N2581 Secondary hyperparathyroidism of renal origin: Secondary | ICD-10-CM | POA: Diagnosis present

## 2013-09-27 LAB — BASIC METABOLIC PANEL
BUN: 47 mg/dL — ABNORMAL HIGH (ref 6–23)
CALCIUM: 8.6 mg/dL (ref 8.4–10.5)
CO2: 26 mEq/L (ref 19–32)
CREATININE: 6.8 mg/dL — AB (ref 0.50–1.35)
Chloride: 96 mEq/L (ref 96–112)
GFR, EST AFRICAN AMERICAN: 8 mL/min — AB (ref >90)
GFR, EST NON AFRICAN AMERICAN: 7 mL/min — AB (ref >90)
Glucose, Bld: 109 mg/dL — ABNORMAL HIGH (ref 70–99)
Potassium: 3.9 mEq/L (ref 3.7–5.3)
Sodium: 137 mEq/L (ref 137–147)

## 2013-09-27 LAB — TYPE AND SCREEN
ABO/RH(D): AB POS
Antibody Screen: NEGATIVE

## 2013-09-27 LAB — CBC
HCT: 30.2 % — ABNORMAL LOW (ref 39.0–52.0)
Hemoglobin: 10.2 g/dL — ABNORMAL LOW (ref 13.0–17.0)
MCH: 35.1 pg — AB (ref 26.0–34.0)
MCHC: 33.8 g/dL (ref 30.0–36.0)
MCV: 103.8 fL — ABNORMAL HIGH (ref 78.0–100.0)
PLATELETS: 78 10*3/uL — AB (ref 150–400)
RBC: 2.91 MIL/uL — ABNORMAL LOW (ref 4.22–5.81)
RDW: 13.7 % (ref 11.5–15.5)
WBC: 2.4 10*3/uL — ABNORMAL LOW (ref 4.0–10.5)

## 2013-09-27 LAB — GLUCOSE, CAPILLARY
GLUCOSE-CAPILLARY: 103 mg/dL — AB (ref 70–99)
Glucose-Capillary: 150 mg/dL — ABNORMAL HIGH (ref 70–99)
Glucose-Capillary: 225 mg/dL — ABNORMAL HIGH (ref 70–99)

## 2013-09-27 LAB — HEPATITIS B SURFACE ANTIGEN: Hepatitis B Surface Ag: NEGATIVE

## 2013-09-27 LAB — HEPATITIS B SURFACE ANTIBODY,QUALITATIVE: Hep B S Ab: NEGATIVE

## 2013-09-27 LAB — VALPROIC ACID LEVEL: VALPROIC ACID LVL: 62.1 ug/mL (ref 50.0–100.0)

## 2013-09-27 MED ORDER — DARBEPOETIN ALFA-POLYSORBATE 60 MCG/0.3ML IJ SOLN
INTRAMUSCULAR | Status: AC
Start: 2013-09-27 — End: 2013-09-27
  Filled 2013-09-27: qty 0.3

## 2013-09-27 MED ORDER — ALTEPLASE 2 MG IJ SOLR
2.0000 mg | Freq: Once | INTRAMUSCULAR | Status: DC | PRN
  Filled 2013-09-27: qty 2

## 2013-09-27 MED ORDER — PENTAFLUOROPROP-TETRAFLUOROETH EX AERO: 1.0000 "application " | INHALATION_SPRAY | CUTANEOUS | Status: DC | PRN

## 2013-09-27 MED ORDER — DOXERCALCIFEROL 4 MCG/2ML IV SOLN
INTRAVENOUS | Status: AC
  Filled 2013-09-27: qty 2

## 2013-09-27 MED ORDER — DIVALPROEX SODIUM 500 MG PO DR TAB
750.0000 mg | DELAYED_RELEASE_TABLET | Freq: Two times a day (BID) | ORAL | Status: DC
  Administered 2013-09-27: 750 mg via ORAL
  Filled 2013-09-27 (×4): qty 1

## 2013-09-27 MED ORDER — NEPRO/CARBSTEADY PO LIQD: 237.0000 mL | ORAL | Status: DC | PRN

## 2013-09-27 MED ORDER — RENA-VITE PO TABS
1.0000 | ORAL_TABLET | Freq: Every day | ORAL | Status: DC
  Administered 2013-09-27 – 2013-09-28 (×2): 1 via ORAL
  Filled 2013-09-27 (×4): qty 1

## 2013-09-27 MED ORDER — SODIUM CHLORIDE 0.9 % IV SOLN
62.5000 mg | INTRAVENOUS | Status: DC
  Administered 2013-09-27: 62.5 mg via INTRAVENOUS
  Filled 2013-09-27 (×2): qty 5

## 2013-09-27 MED ORDER — LIDOCAINE HCL (PF) 1 % IJ SOLN: 5.0000 mL | INTRAMUSCULAR | Status: DC | PRN

## 2013-09-27 MED ORDER — HEPARIN SODIUM (PORCINE) 1000 UNIT/ML DIALYSIS: 20.0000 [IU]/kg | INTRAMUSCULAR | Status: DC | PRN

## 2013-09-27 MED ORDER — HEPARIN SODIUM (PORCINE) 1000 UNIT/ML DIALYSIS: 1000.0000 [IU] | INTRAMUSCULAR | Status: DC | PRN

## 2013-09-27 MED ORDER — LIDOCAINE-PRILOCAINE 2.5-2.5 % EX CREA: 1.0000 "application " | TOPICAL_CREAM | CUTANEOUS | Status: DC | PRN

## 2013-09-27 MED ORDER — SODIUM CHLORIDE 0.9 % IV SOLN: 100.0000 mL | INTRAVENOUS | Status: DC | PRN

## 2013-09-27 MED ORDER — DARBEPOETIN ALFA-POLYSORBATE 60 MCG/0.3ML IJ SOLN
60.0000 ug | INTRAMUSCULAR | Status: DC
  Administered 2013-09-27: 60 ug via INTRAVENOUS
  Filled 2013-09-27: qty 0.3

## 2013-09-27 MED ORDER — DOXERCALCIFEROL 4 MCG/2ML IV SOLN
4.0000 ug | INTRAVENOUS | Status: DC
  Administered 2013-09-27 – 2013-09-29 (×2): 4 ug via INTRAVENOUS
  Filled 2013-09-27 (×2): qty 2

## 2013-09-27 NOTE — Progress Notes (Signed)
Subjective: No ongoing seizure on EEG while I was in the room, formal report to follow later that actually reviews all of eeg. .     Exam: Filed Vitals:   09/27/13 0559  BP: 138/53  Pulse: 66  Temp: 97.6 F (36.4 C)  Resp: 18   Gen: In bed, NAD MS: Awake, alert, oriented to place and year DV:VOHYW, EOMI Motor: 5/5 throughotu  VPA level 107 on admission.   Impression: 80 yo M with AMS. His VPA level was high and it is possible that this contributed to his AMS. Multifactorial delirium also a possibility, the fact that he appears to be improving is reassuring.   Recommendations: 1) recheck vpa level, likely will restart today.  2) MRI brain 3) f/u EEG results.  4) continue vimpat 100mg  BID  , MD [facility] [phone]  If 7pm- 7am, please page neurology on call at [phone].

## 2013-09-27 NOTE — Procedures (Signed)
I have personally attended this patient's dialysis session.   Right AVF required cannulation X3 because could not thread initial venous needle, but no problems with 2nd one.   4K bath (K 3.9) BFR 400 124/63 SR at 71.    [name] B

## 2013-09-27 NOTE — Progress Notes (Signed)
LTVM discontinued per Dr Amada Jupiter.

## 2013-09-27 NOTE — Consult Note (Signed)
[facility] Renal Consultation Note    Indication for Consultation:  Management of ESRD/hemodialysis; anemia, hypertension/volume and secondary hyperparathyroidism  HPI: [name] is a 80 y.o. male with ESRD on HD since May 2014 on TTS at [facility].Marland Kitchen  Pt tells me today he is here because he had a seizure. He denies SOB, CP, N, V, D or hx of having problems holding his urine.  Today when he was brought into the dialysis unit, the staff found his soaked in urine. I spoke with patient's wife.  At baseline he ambulated slowly with a cane at times, feeds, bathes and dresses himself and manages his own medications. He was taken to and from HD Tuesday by son/daughter-in law and patient wasn't "quite right" He was afebrile at HD Tuesday. BP/volume were usual with net UF of 2.5.  His family brought him to the Hospital Tuesday evening due to "personality change" per ED note. He is due for HD today,  Past Medical History  Diagnosis Date  . Hypertension   . Diabetes mellitus without complication   . Arthritis   . Renal disorder     chronic renal disease  . History of blood transfusion   . GI bleed   . History of blood transfusion    Past Surgical History  Procedure Laterality Date  . Esophagogastroduodenoscopy  10/03/2012    Procedure: ESOPHAGOGASTRODUODENOSCOPY (EGD);  Surgeon: Beryle Beams, MD;  Location: [facility] ENDOSCOPY;  Service: Endoscopy;  Laterality: N/A;  . Colonoscopy  10/04/2012    Procedure: COLONOSCOPY;  Surgeon: Beryle Beams, MD;  Location: [facility] ENDOSCOPY;  Service: Endoscopy;  Laterality: N/A;  . Colonoscopy with esophagogastroduodenoscopy (egd) Left 11/30/2012    Procedure: COLONOSCOPY WITH ESOPHAGOGASTRODUODENOSCOPY (EGD);  Surgeon: Beryle Beams, MD;  Location: [facility] ENDOSCOPY;  Service: Endoscopy;  Laterality: Left;   Family History  Problem Relation Age of Onset  . Sudden death Mother   . Sudden death Father    Social History:  reports that he has never smoked. He  does not have any smokeless tobacco history on file. He reports that he does not drink alcohol or use illicit drugs. No Known Allergies Prior to Admission medications   Medication Sig Start Date End Date Taking? Authorizing Provider  divalproex (DEPAKOTE ER) 500 MG 24 hr tablet Take 1,000 mg by mouth every 12 (twelve) hours. 05/31/13  Yes Philmore Pali, NP  HYDROcodone-acetaminophen (NORCO/VICODIN) 5-325 MG per tablet Take 1 tablet by mouth every 6 (six) hours as needed for moderate pain.   Yes Historical Provider, MD  telmisartan (MICARDIS) 80 MG tablet Take 80 mg by mouth daily.   Yes Historical Provider, MD  amLODipine (NORVASC) 10 MG tablet Take 10 mg by mouth Daily.  08/15/12   Historical Provider, MD  calcium carbonate (TUMS - DOSED IN MG ELEMENTAL CALCIUM) 500 MG chewable tablet Chew 1 tablet (200 mg of elemental calcium total) by mouth 3 (three) times daily with meals. 12/01/12   Shanker Kristeen Mans, MD  diphenhydramine-acetaminophen (TYLENOL PM) 25-500 MG TABS Take 1 tablet by mouth at bedtime as needed (for pain).    Historical Provider, MD  pantoprazole (PROTONIX) 20 MG tablet Take 40 mg by mouth 2 (two) times daily.  10/08/12   Costin Karlyne Greenspan, MD   Current Facility-Administered Medications  Medication Dose Route Frequency Provider Last Rate Last Dose  . allopurinol (ZYLOPRIM) tablet 100 mg  100 mg Oral 2 times weekly Etta Quill, DO   100 mg at 09/27/13 1000  .  amLODipine (NORVASC) tablet 10 mg  10 mg Oral Daily Etta Quill, DO   10 mg at 09/26/13 1058  . calcium carbonate (TUMS - dosed in mg elemental calcium) chewable tablet 200 mg of elemental calcium  1 tablet Oral TID WC Etta Quill, DO   200 mg of elemental calcium at 09/27/13 1144  . darbepoetin (ARANESP) injection 60 mcg  60 mcg Intravenous Q Thu-HD Myriam Jacobson, PA-C      . doxercalciferol (HECTOROL) injection 4 mcg  4 mcg Intravenous Q T,Th,Sa-HD Myriam Jacobson, PA-C      . ferric gluconate (NULECIT) 62.5 mg in  sodium chloride 0.9 % 100 mL IVPB  62.5 mg Intravenous Q Thu-HD Myriam Jacobson, PA-C      . heparin injection 5,000 Units  5,000 Units Subcutaneous Q8H Etta Quill, DO   5,000 Units at 09/27/13 7026  . insulin aspart (novoLOG) injection 0-5 Units  0-5 Units Subcutaneous QHS Jessica U Vann, DO      . insulin aspart (novoLOG) injection 0-9 Units  0-9 Units Subcutaneous TID WC Geradine Girt, DO   1 Units at 09/26/13 1334  . irbesartan (AVAPRO) tablet 300 mg  300 mg Oral Daily Etta Quill, DO   300 mg at 09/26/13 1058  . lacosamide (VIMPAT) 100 mg in sodium chloride 0.9 % 25 mL IVPB  100 mg Intravenous Q12H Dorian Pod, MD   100 mg at 09/27/13 1133  . pantoprazole (PROTONIX) EC tablet 40 mg  40 mg Oral BID Etta Quill, DO   40 mg at 09/27/13 1000   Labs: Basic Metabolic Panel:  Recent Labs Lab 09/25/13 1900 09/26/13 0900 09/27/13 0539  NA 137 139 137  K 3.6* 3.8 3.9  CL 93* 97 96  CO2 27 25 26   GLUCOSE 135* 94 109*  BUN 29* 36* 47*  CREATININE 4.29* 5.33* 6.80*  CALCIUM 8.7 8.3* 8.6   Liver Function Tests:  Recent Labs Lab 09/25/13 1900  AST 13  ALT 5  ALKPHOS 40  BILITOT 0.3  PROT 7.4  ALBUMIN 3.4*  CBC:  Recent Labs Lab 09/25/13 1900 09/26/13 0900 09/27/13 0539  WBC 3.8* 2.9* 2.4*  HGB 10.6* 10.0* 10.2*  HCT 31.4* 30.0* 30.2*  MCV 105.7* 105.6* 103.8*  PLT 82* 80* 78*  CBG:  Recent Labs Lab 09/26/13 0412 09/26/13 0820 09/26/13 1128 09/26/13 1607 09/27/13 0747  GLUCAP 102* 98 124* 94 103*   IStudies/Results: Dg Chest 2 View  09/25/2013   CLINICAL DATA:  Altered mental status.  EXAM: CHEST  2 VIEW  COMPARISON:  01/24/2013  FINDINGS: Two views of the chest were obtained. Mild fullness in the right peritracheal region is unchanged. Heart size is normal. No evidence for airspace disease or edema. No evidence for pleural effusions.  IMPRESSION: No acute chest findings.   Electronically Signed   By: Markus Daft M.D.   On: 09/25/2013 21:37   Ct  Head Wo Contrast  09/25/2013   CLINICAL DATA:  Altered level of consciousness. Altered mental status.  EXAM: CT HEAD WITHOUT CONTRAST  TECHNIQUE: Contiguous axial images were obtained from the base of the skull through the vertex without intravenous contrast.  COMPARISON:  01/15/2013  FINDINGS: There is no evidence of intracranial hemorrhage, brain edema, or other signs of acute infarction. There is no evidence of intracranial mass lesion or mass effect. No abnormal extraaxial fluid collections are identified.  Mild cerebral atrophy is stable. Extensive chronic small vessel disease is  also unchanged in appearance. Ventricles are stable in size. No evidence of skull fracture or other significant bone abnormality.  IMPRESSION: No acute intracranial findings.  Stable chronic small vessel disease and mild cerebral atrophy.   Electronically Signed   By: Earle Gell M.D.   On: 09/25/2013 20:04   ROS: As per HPI; validity limited my current mental status. Physical Exam: Filed Vitals:   09/26/13 2112 09/27/13 0124 09/27/13 0559 09/27/13 1016  BP: 142/48 149/52 138/53 133/74  Pulse: 70 68 66 65  Temp: 98.7 F (37.1 C) 98.4 F (36.9 C) 97.6 F (36.4 C) 97.6 F (36.4 C)  TempSrc: Oral Oral Oral Oral  Resp: 18 20 18 18   Height:      Weight:      SpO2: 98% 99% 99% 98%     General: Well developed, well nourished, flat affect Head: Normocephalic, atraumatic, sclera non-icteric, mucus membranes are moist; staring at times paucity of movement Neck: Supple. JVD not elevated. Lungs: Clear bilaterally to auscultation without wheezes, rales, or rhonchi. Breathing is unlabored. Heart: RRR with S1 S2. No murmurs, rubs, or gallops appreciated. Abdomen: Soft, non-tender, non-distended with normoactive bowel sounds. No rebound/guarding. No obvious abdominal masses. Lower extremities:without edema or ischemic changes, no open wounds  Neuro: answers some questions; very weak when staff had him stand for  weight Dialysis Access: right AVF patent  Dialysis Orders: Center:[facility]AST on TTS  EDW 93.5 2 K 2.25 Ca 450/A 1.5  Epogen  venofer 50/week and Hectorol 4 Recent labs: Hgb 9.8 Dec and 9.9   Asessment/Plan: 1. AMS - work up per primary; initial CXR neg, UA neg, Head CT chronic changes,, EKG sinus rhythm. CBC remarkable in that WBC  (3.8 > 2,4)lower than usual and trending down as are platelets (82>78). EEG negative for seizures; neuro to see 2. ESRD -  TTS - K 3.9 today so will use 4 K bath;  3. Hypertension/volume  -leaving a little below EDW at times - reassess edw while here; CXR on admission neg volume 4. Anemia  - Hgb trending down since admission - Dose Aranesp 50 today and follow; continue weekly IV Fe 5. Metabolic bone disease -  Continue Hectorol 4, binders (note when I queried him about binders - he told me he wasn't taking any) 6. Nutrition - Dysphagia 3 diet for now - add vitamins and fluid restriction 7. Type 2 DM - SSI 8.    Hx of seizure disorder - depakote level elevated on admission post HD - now down to 62 in range  Myriam Jacobson, PA-C [place] [phone] 09/27/2013, 11:50 AM   I have seen and examined this patient and agree with assessment and exam as outlined by MBergman, Rayle.  First time I have ever met this gentleman so I am not aware of his baseline. Says to me "I had some kind of a seizure" VS are stable, non focal other than some continuous lip smacking type movements, but not oriented to day, thinks it is January 15th, when asked about his dialysis days responded "Tuesday and Thursday and Thursday".  VPA level has dropped into the therapeutic range - was elevated on presentation but just above the upper limits of normal.  Wouldn't seem dramatic enough to cause the MS issues that have been observed. No seizure activity noted by Dr. Katherine Roan (Neuro) while he was in room with patient but forma report pending.  Await further workup including MRI. On  dialysis at present.  Elan Mcelvain B,MD 09/27/2013 2:17 PM

## 2013-09-27 NOTE — Progress Notes (Signed)
PT Cancellation Note  Patient Details  Name: [name]  MRN: [id number]  DOB: 13-Jun-1932  Cancelled Treatment: Reason Eval/Treat Not Completed: Patient at procedure or test/ unavailable. Will continue to follow.   Charlotte Crumb, PT DPT  [phone]

## 2013-09-27 NOTE — Progress Notes (Signed)
[facility] PROGRESS NOTE  [name] GEX:[id number] DOB: Jun 07, 1932 DOA: 09/25/2013 PCP: Cecille Aver, MD  Assessment/Plan: AMS - ?CVA vs seizure vas medications (most likely medications) -VPA- per nephro -MRI pending -continuous EEG- did not show seizures -neuro consult -PT/OT eval  ESRD - called nephro for HD today  H/o DM - SSI  H/o Seizure disorder, see #1 above, supra-theraputic VPA right now.     Code Status: full Family Communication: patient/son [name] Disposition Plan: hope for d/c in AM   Consultants:  neuro  Procedures:  Continuous EEG  Antibiotics:    HPI/Subjective: Patient asking to go home No SOB, no CP  Objective: Filed Vitals:   09/27/13 1016  BP: 133/74  Pulse: 65  Temp: 97.6 F (36.4 C)  Resp: 18    Intake/Output Summary (Last 24 hours) at 09/27/13 1216 Last data filed at 09/26/13 1700  Gross per 24 hour  Intake    360 ml  Output    400 ml  Net    -40 ml   Filed Weights   09/26/13 1600  Weight: 94.802 kg (209 lb)    Exam:   General:  Awake- pleasant/cooperative  Cardiovascular: rrr  Respiratory: clear anterior  Abdomen: +BS, soft  Musculoskeletal: moves al 4 ext   Data Reviewed: Basic Metabolic Panel:  Recent Labs Lab 09/25/13 1900 09/26/13 0900 09/27/13 0539  NA 137 139 137  K 3.6* 3.8 3.9  CL 93* 97 96  CO2 27 25 26   GLUCOSE 135* 94 109*  BUN 29* 36* 47*  CREATININE 4.29* 5.33* 6.80*  CALCIUM 8.7 8.3* 8.6   Liver Function Tests:  Recent Labs Lab 09/25/13 1900  AST 13  ALT 5  ALKPHOS 40  BILITOT 0.3  PROT 7.4  ALBUMIN 3.4*   No results found for this basename: LIPASE, AMYLASE,  in the last 168 hours No results found for this basename: AMMONIA,  in the last 168 hours CBC:  Recent Labs Lab 09/25/13 1900 09/26/13 0900 09/27/13 0539  WBC 3.8* 2.9* 2.4*  HGB 10.6* 10.0* 10.2*  HCT 31.4* 30.0* 30.2*  MCV 105.7* 105.6* 103.8*  PLT 82* 80* 78*   Cardiac Enzymes: No  results found for this basename: CKTOTAL, CKMB, CKMBINDEX, TROPONINI,  in the last 168 hours BNP (last 3 results) No results found for this basename: PROBNP,  in the last 8760 hours CBG:  Recent Labs Lab 09/26/13 0412 09/26/13 0820 09/26/13 1128 09/26/13 1607 09/27/13 0747  GLUCAP 102* 98 124* 94 103*    No results found for this or any previous visit (from the past 240 hour(s)).   Studies: Dg Chest 2 View  09/25/2013   CLINICAL DATA:  Altered mental status.  EXAM: CHEST  2 VIEW  COMPARISON:  01/24/2013  FINDINGS: Two views of the chest were obtained. Mild fullness in the right peritracheal region is unchanged. Heart size is normal. No evidence for airspace disease or edema. No evidence for pleural effusions.  IMPRESSION: No acute chest findings.   Electronically Signed   By: 01/26/2013 M.D.   On: 09/25/2013 21:37   Ct Head Wo Contrast  09/25/2013   CLINICAL DATA:  Altered level of consciousness. Altered mental status.  EXAM: CT HEAD WITHOUT CONTRAST  TECHNIQUE: Contiguous axial images were obtained from the base of the skull through the vertex without intravenous contrast.  COMPARISON:  01/15/2013  FINDINGS: There is no evidence of intracranial hemorrhage, brain edema, or other signs of acute infarction. There is no evidence of intracranial  mass lesion or mass effect. No abnormal extraaxial fluid collections are identified.  Mild cerebral atrophy is stable. Extensive chronic small vessel disease is also unchanged in appearance. Ventricles are stable in size. No evidence of skull fracture or other significant bone abnormality.  IMPRESSION: No acute intracranial findings.  Stable chronic small vessel disease and mild cerebral atrophy.   Electronically Signed   By: Myles Rosenthal M.D.   On: 09/25/2013 20:04    Scheduled Meds: . allopurinol  100 mg Oral 2 times weekly  . amLODipine  10 mg Oral Daily  . calcium carbonate  1 tablet Oral TID WC  . darbepoetin (ARANESP) injection - DIALYSIS  60  mcg Intravenous Q Thu-HD  . doxercalciferol  4 mcg Intravenous Q T,Th,Sa-HD  . ferric gluconate (FERRLECIT/NULECIT) IV  62.5 mg Intravenous Q Thu-HD  . heparin  5,000 Units Subcutaneous Q8H  . insulin aspart  0-5 Units Subcutaneous QHS  . insulin aspart  0-9 Units Subcutaneous TID WC  . irbesartan  300 mg Oral Daily  . lacosamide (VIMPAT) IV  100 mg Intravenous Q12H  . multivitamin  1 tablet Oral QHS  . pantoprazole  40 mg Oral BID   Continuous Infusions:    Principal Problem:   Altered mental status Active Problems:   Anemia in chronic kidney disease   Seizures   Altered mental state   ESRD on dialysis   Secondary hyperparathyroidism (of renal origin)    Time spent: 25 min    [name]  [facility] Pager [phone]. If 7PM-7AM, please contact night-coverage at [email or web address], password [facility] 09/27/2013, 12:16 PM  LOS: 2 days

## 2013-09-27 NOTE — Progress Notes (Signed)
OT Cancellation Note  Patient Details Name: [name] MRN: [id number] DOB: October 28, 1931   Cancelled Treatment:    Reason Eval/Treat Not Completed: Patient at procedure or test/ unavailable.  Will continue to follow.  Evern Bio 09/27/2013, 12:24 PM

## 2013-09-28 DIAGNOSIS — N039 Chronic nephritic syndrome with unspecified morphologic changes: Secondary | ICD-10-CM

## 2013-09-28 DIAGNOSIS — E119 Type 2 diabetes mellitus without complications: Secondary | ICD-10-CM

## 2013-09-28 DIAGNOSIS — N189 Chronic kidney disease, unspecified: Secondary | ICD-10-CM

## 2013-09-28 DIAGNOSIS — D631 Anemia in chronic kidney disease: Secondary | ICD-10-CM

## 2013-09-28 LAB — GLUCOSE, CAPILLARY
GLUCOSE-CAPILLARY: 87 mg/dL (ref 70–99)
Glucose-Capillary: 154 mg/dL — ABNORMAL HIGH (ref 70–99)
Glucose-Capillary: 185 mg/dL — ABNORMAL HIGH (ref 70–99)
Glucose-Capillary: 127 mg/dL — ABNORMAL HIGH (ref 70–99)

## 2013-09-28 LAB — VALPROIC ACID LEVEL: VALPROIC ACID LVL: 60.5 ug/mL (ref 50.0–100.0)

## 2013-09-28 LAB — HIV ANTIBODY (ROUTINE TESTING W REFLEX): HIV: NONREACTIVE

## 2013-09-28 MED ORDER — LACOSAMIDE 200 MG PO TABS
100.0000 mg | ORAL_TABLET | Freq: Two times a day (BID) | ORAL | Status: DC
  Administered 2013-09-28 – 2013-09-29 (×3): 100 mg via ORAL
  Filled 2013-09-28 (×2): qty 2
  Filled 2013-09-28: qty 1

## 2013-09-28 MED ORDER — FOLIC ACID 1 MG PO TABS
1.0000 mg | ORAL_TABLET | Freq: Every day | ORAL | Status: DC
  Administered 2013-09-28: 1 mg via ORAL
  Filled 2013-09-28 (×2): qty 1

## 2013-09-28 NOTE — Progress Notes (Signed)
Patient ID: [name], male   DOB: 15-Jan-1932, 80 y.o.   MRN: [id number]  [facility] Progress Note    Subjective:   No complaints, more awake/alert but still confused   Objective:   BP 159/76  Pulse 71  Temp(Src) 97.9 F (36.6 C) (Oral)  Resp 20  Ht 6' (1.829 m)  Wt 93.9 kg (207 lb 0.2 oz)  BMI 28.07 kg/m2  SpO2 98%  Intake/Output: I/O last 3 completed shifts: In: 240 [P.O.:240] Out: 971 [Urine:200; Other:771]   Intake/Output this shift:    Weight change: -0.902 kg (-1 lb 15.8 oz)  Physical Exam: Gen:WD elderly AAM in NAD CVS:no rub Resp:cta Abd:+BS, soft, NT/ND Ext:no edema, RUE AVF +T/B Neuro: awake/alert but disoriented: knows he is at [facility], "July 1957, President [place], [place]"  Labs: BMET  Recent Labs Lab 09/25/13 1900 09/26/13 0900 09/27/13 0539  NA 137 139 137  K 3.6* 3.8 3.9  CL 93* 97 96  CO2 27 25 26   GLUCOSE 135* 94 109*  BUN 29* 36* 47*  CREATININE 4.29* 5.33* 6.80*  ALBUMIN 3.4*  --   --   CALCIUM 8.7 8.3* 8.6   CBC  Recent Labs Lab 09/25/13 1900 09/26/13 0900 09/27/13 0539  WBC 3.8* 2.9* 2.4*  HGB 10.6* 10.0* 10.2*  HCT 31.4* 30.0* 30.2*  MCV 105.7* 105.6* 103.8*  PLT 82* 80* 78*    @IMGRELPRIORS @ Medications:    . allopurinol  100 mg Oral 2 times weekly  . amLODipine  10 mg Oral Daily  . calcium carbonate  1 tablet Oral TID WC  . darbepoetin (ARANESP) injection - DIALYSIS  60 mcg Intravenous Q Thu-HD  . doxercalciferol  4 mcg Intravenous Q T,Th,Sa-HD  . ferric gluconate (FERRLECIT/NULECIT) IV  62.5 mg Intravenous Q Thu-HD  . insulin aspart  0-5 Units Subcutaneous QHS  . insulin aspart  0-9 Units Subcutaneous TID WC  . irbesartan  300 mg Oral Daily  . lacosamide  100 mg Oral BID  . multivitamin  1 tablet Oral QHS  . pantoprazole  40 mg Oral BID     Dialysis Orders: Center:[facility]AST on TTS EDW 93.5 2 K 2.25 Ca 450/A 1.5  Epogen venofer 50/week and Hectorol 4  Recent labs: Hgb 9.8 Dec and 9.9    Asessment/Plan:  1. AMS - work up per primary and neuro without reason for ongoing delirium/disorientation; initial CXR neg, UA neg, Head CT and MRI of brain with chronic changes, no ongoing sz activity on EEG, EKG sinus rhythm. CBC remarkable in that WBC (3.8 > 2,4)lower than usual and trending down as are platelets (82>78). Neuro following 2. ESRD - TTS - K 3.9 today so will use 4 K bath;  3. Hypertension/volume -leaving a little below EDW at times - reassess edw while here; CXR on admission neg volume 4. Anemia - Hgb trending down since admission - Dose Aranesp 50 today and follow; continue weekly IV Fe 5. Metabolic bone disease - Continue Hectorol 4, binders (note when I queried him about binders - he told me he wasn't taking any) 6. Nutrition - Dysphagia 3 diet for now - add vitamins and fluid restriction 7. Type 2 DM - SSI 8. Seizure disorder - depakote level elevated on admission post HD - now in therapeutic range   Cuma Polyakov A 09/28/2013, 10:52 AM

## 2013-09-28 NOTE — Evaluation (Signed)
Physical Therapy Evaluation Patient Details Name: [name] MRN: [id number] DOB: 1932-08-14 Today's Date: 09/28/2013 Time: 0917-0950 PT Time Calculation (min): 33 min  PT Assessment / Plan / Recommendation History of Present Illness  80 y.o. male with ESRD on HD since May 2014 on TTS at [facility]. Pt admitted with AMS and seizure x2. At baseline he ambulated slowly with a cane at times, feeds, bathes and dresses himself and manages his own medications.   Clinical Impression  Patient demonstrates deficits in functional mobility and cognition as indicated below. Will benefit from HHPT upon discharge. Education performed, mobility assessed.  Patient will need assist for mobility upon discharge.  Will defer all further PT services to [facility] as patient is for discharge home with family assist 24/7 per MD.      PT Assessment  Patient needs continued PT services    Follow Up Recommendations  Home health PT;Supervision/Assistance - 24 hour          Equipment Recommendations   Per patient, all equipment needs are met   Recommendations for Other Services     Frequency Min 3X/week    Precautions / Restrictions Precautions Precautions: Fall Restrictions Weight Bearing Restrictions: No   Pertinent Vitals/Pain Patient complains of low back pain      Mobility  Bed Mobility Overal bed mobility: Needs Assistance Bed Mobility: Supine to Sit Supine to sit: Supervision General bed mobility comments: increased time and max VCs to come to EOB Transfers Overall transfer level: Needs assistance Equipment used: Rolling walker (2 wheeled) Transfers: Sit to/from Stand Sit to Stand: Min assist General transfer comment: Assist to block foot from slinding, VCs for hand placement and positioning, assist to stabilize rw.  Patient requires significant time to perform transfers.  Max VCs for safety with stand to sit using RW.   Ambulation/Gait Ambulation/Gait assistance: Min guard Ambulation  Distance (Feet): 24 Feet Assistive device: Rolling walker (2 wheeled) Gait Pattern/deviations: Step-to pattern;Shuffle;Trunk flexed;Narrow base of support Gait velocity: decreased significantly Gait velocity interpretation: <1.8 ft/sec, indicative of risk for recurrent falls General Gait Details: Max VCs for upright posture and position with rw. Stairs: Yes Stairs assistance: Mod assist Stair Management: Two rails;Step to pattern;Forwards Number of Stairs: 3 General stair comments: Significantly increased time required to perform    Exercises     PT Diagnosis: Difficulty walking;Abnormality of gait;Generalized weakness;Altered mental status  PT Problem List: Decreased strength;Decreased range of motion;Decreased activity tolerance;Decreased balance;Decreased mobility;Decreased coordination;Decreased cognition PT Treatment Interventions: DME instruction;Gait training;Stair training;Functional mobility training;Therapeutic activities;Therapeutic exercise;Balance training;Cognitive remediation;Patient/family education     PT Goals(Current goals can be found in the care plan section) Acute Rehab PT Goals Patient Stated Goal: to go home today PT Goal Formulation: With patient Time For Goal Achievement: 10/12/13 Potential to Achieve Goals: Fair  Visit Information  Last PT Received On: 09/28/13 Assistance Needed: +1 PT/OT/SLP Co-Evaluation/Treatment: Yes Reason for Co-Treatment: For patient/therapist safety PT goals addressed during session: Mobility/safety with mobility;Balance OT goals addressed during session: ADL's and self-care History of Present Illness: 80 y.o. male with ESRD on HD since May 2014 on TTS at [facility]. Pt admitted with AMS and seizure x2. At baseline he ambulated slowly with a cane at times, feeds, bathes and dresses himself and manages his own medications.        Prior [place] expects to be discharged to:: Private residence Living  Arrangements: Spouse/significant other Available Help at Discharge: Family Type of Home: House Home Access: Stairs to enter Entrance  Stairs-Number of Steps: 10 Entrance Stairs-Rails: Can reach both Home Layout: Two level;Bed/bath upstairs Alternate Level Stairs-Number of Steps: stair lift to second story Alternate Level Stairs-Rails: Can reach both Home Equipment: Walker - 2 wheels;Crutches;Cane - single point Additional Comments: question accuracy of patient self reporting. Per note patient manages personal medication independently Prior Function Level of Independence: Independent Comments: has 24/7 caregivers (family) Communication Communication: No difficulties Dominant Hand: Right    Cognition  Cognition Arousal/Alertness: Awake/alert Behavior During Therapy: WFL for tasks assessed/performed    Extremity/Trunk Assessment Upper Extremity Assessment Upper Extremity Assessment: Overall WFL for tasks assessed Lower Extremity Assessment Lower Extremity Assessment: Generalized weakness Cervical / Trunk Assessment Cervical / Trunk Assessment: Kyphotic (anterior lean)      End of Session PT - End of Session Equipment Utilized During Treatment: Gait belt Activity Tolerance: Patient tolerated treatment well Patient left: in chair;with call bell/phone within reach;with chair alarm set Nurse Communication: Mobility status  GP     Duncan Dull 09/28/2013, 12:21 PM Alben Deeds, [place] DPT  [phone]

## 2013-09-28 NOTE — Progress Notes (Signed)
[facility] PROGRESS NOTE  [name] FMB:[id number] DOB: Jan 14, 1932 DOA: 09/25/2013 PCP: Cecille Aver, MD  Assessment/Plan: AMS - ?CVA vs seizure vas medications (most likely medications) -VPA- will d/c as this could be decreasing plt/wbc and patient was not taking correctly -MRI negative for CVA but + for atrophy- will check HIV as quite a bit of atrophy as compared to before -continuous EEG- did not show seizures -neuro consult Per son, metal status improved but patient still confused about year -PT/OT eval- home health as family declines SNF  ESRD - called nephro for HD in AM and hope for d/c after  H/o DM - SSI  H/o Seizure disorder, change to vimpat     Code Status: full Family Communication: patient/son Licensed conveyancer Disposition Plan: hope for d/c in AM   Consultants:  neuro  Procedures:  Continuous EEG  Antibiotics:    HPI/Subjective: Patient asking to go home No SOB, no CP  Objective: Filed Vitals:   09/28/13 1108  BP: 117/47  Pulse:   Temp:   Resp:     Intake/Output Summary (Last 24 hours) at 09/28/13 1128 Last data filed at 09/27/13 1728  Gross per 24 hour  Intake    240 ml  Output    771 ml  Net   -531 ml   Filed Weights   09/26/13 1600 09/27/13 1205  Weight: 94.802 kg (209 lb) 93.9 kg (207 lb 0.2 oz)    Exam:   General:  Awake- pleasant/cooperative  Cardiovascular: rrr  Respiratory: clear anterior  Abdomen: +BS, soft  Musculoskeletal: moves al 4 ext   Data Reviewed: Basic Metabolic Panel:  Recent Labs Lab 09/25/13 1900 09/26/13 0900 09/27/13 0539  NA 137 139 137  K 3.6* 3.8 3.9  CL 93* 97 96  CO2 27 25 26   GLUCOSE 135* 94 109*  BUN 29* 36* 47*  CREATININE 4.29* 5.33* 6.80*  CALCIUM 8.7 8.3* 8.6   Liver Function Tests:  Recent Labs Lab 09/25/13 1900  AST 13  ALT 5  ALKPHOS 40  BILITOT 0.3  PROT 7.4  ALBUMIN 3.4*   No results found for this basename: LIPASE, AMYLASE,  in the last 168  hours No results found for this basename: AMMONIA,  in the last 168 hours CBC:  Recent Labs Lab 09/25/13 1900 09/26/13 0900 09/27/13 0539  WBC 3.8* 2.9* 2.4*  HGB 10.6* 10.0* 10.2*  HCT 31.4* 30.0* 30.2*  MCV 105.7* 105.6* 103.8*  PLT 82* 80* 78*   Cardiac Enzymes: No results found for this basename: CKTOTAL, CKMB, CKMBINDEX, TROPONINI,  in the last 168 hours BNP (last 3 results) No results found for this basename: PROBNP,  in the last 8760 hours CBG:  Recent Labs Lab 09/26/13 1607 09/27/13 0747 09/27/13 1727 09/27/13 2202 09/28/13 0653  GLUCAP 94 103* 150* 225* 87    No results found for this or any previous visit (from the past 240 hour(s)).   Studies: Mr Brain Wo Contrast  09/27/2013   CLINICAL DATA:  Altered mental status.  EXAM: MRI HEAD WITHOUT CONTRAST  TECHNIQUE: Multiplanar, multiecho pulse sequences of the brain and surrounding structures were obtained without intravenous contrast.  COMPARISON:  Head CT 09/25/2013 and brain MRI 01/16/2013  FINDINGS: Images are mildly to moderately degraded by motion artifact.  Again seen are multiple punctate foci of susceptibility artifact within both cerebral hemispheres, greatest in the occipital lobes and posterior left temporal lobe. These are have slightly increased in number from the prior study and are compatible with remote  microhemorrhages. There is no acute infarct. Confluent T2 hyperintensity within the subcortical and deep cerebral white matter bilaterally has increased significantly from the prior MRI, particularly in the frontal lobes. There is moderate cerebral atrophy, also increased from the prior MRI. There is no evidence of mass, midline shift, or extra-axial fluid collection.  Major intracranial vascular flow voids are unremarkable. Prior bilateral cataract surgery is noted. Mild bilateral ethmoid and maxillary sinus mucosal thickening is noted. Mastoid air cells are clear. Limited visualization of the upper cervical  spine demonstrates moderate multilevel spondylosis.  IMPRESSION: 1. No evidence of acute infarct. 2. Significant interval progression of white matter disease from prior MRI. While this may represent progression of chronic microvascular ischemia, other etiologies, such as metabolic and infectious encephalopathies (including HIV), are not excluded. 3. Mild interval progression of cerebral atrophy and bilateral cerebral microhemorrhages.   Electronically Signed   By: Sebastian Ache   On: 09/27/2013 19:53    Scheduled Meds: . allopurinol  100 mg Oral 2 times weekly  . amLODipine  10 mg Oral Daily  . calcium carbonate  1 tablet Oral TID WC  . darbepoetin (ARANESP) injection - DIALYSIS  60 mcg Intravenous Q Thu-HD  . doxercalciferol  4 mcg Intravenous Q T,Th,Sa-HD  . ferric gluconate (FERRLECIT/NULECIT) IV  62.5 mg Intravenous Q Thu-HD  . insulin aspart  0-5 Units Subcutaneous QHS  . insulin aspart  0-9 Units Subcutaneous TID WC  . irbesartan  300 mg Oral Daily  . lacosamide  100 mg Oral BID  . multivitamin  1 tablet Oral QHS  . pantoprazole  40 mg Oral BID   Continuous Infusions:    Principal Problem:   Altered mental status Active Problems:   Anemia in chronic kidney disease   Seizures   Altered mental state   ESRD on dialysis   Secondary hyperparathyroidism (of renal origin)    Time spent: 25 min    Donal Lynam  [facility] Pager [phone]. If 7PM-7AM, please contact night-coverage at [email or web address], password [facility] 09/28/2013, 11:28 AM  LOS: 3 days

## 2013-09-28 NOTE — Evaluation (Signed)
Occupational Therapy Evaluation Patient Details Name: [name] MRN: [id number] DOB: 12-24-1931 Today's Date: 09/28/2013 Time: 0240-9735 OT Time Calculation (min): 32 min  OT Assessment / Plan / Recommendation History of present illness 80 y.o. male with ESRD on HD since May 2014 on TTS at [facility]. Pt admitted with AMS and seizure x2. At baseline he ambulated slowly with a cane at times, feeds, bathes and dresses himself and manages his own medications.    Clinical Impression   Patient evaluated by Occupational Therapy with no further acute OT needs identified. All education has been completed and the patient has no further questions. See below for any follow-up Occupational Therapy or equipment needs. OT to sign off. Thank you for referral. Recommend FAMILY DO ALL MEDICATION MANAGEMENT , MUST ASSIST WITH ADLS for hygiene and HHOT. Pt with cognitive deficits demonstrated throughout session.    OT Assessment  All further OT needs can be met in the next venue of care    Follow Up Recommendations  Home health OT;Supervision/Assistance - 24 hour    Barriers to Discharge      Equipment Recommendations  Wheelchair (measurements OT);Wheelchair cushion (measurements OT)    Recommendations for Other Services    Frequency       Precautions / Restrictions Precautions Precautions: Fall Restrictions Weight Bearing Restrictions: No   Pertinent Vitals/Pain None reported    ADL  Where Assessed - Eating/Feeding:  (spillage noted on patient and bed) Grooming: Wash/dry hands;Wash/dry face;Teeth care;Moderate assistance Where Assessed - Grooming: Unsupported standing Toilet Transfer: Min Psychiatric nurse Method: Sit to stand (incr time required) Toilet Transfer Equipment: Raised toilet seat with arms (or 3-in-1 over toilet) Equipment Used: Gait belt;Rolling walker Transfers/Ambulation Related to ADLs: Pt ambulating with decr gait velocity and high risk for falls. pt needs constant  cueing for upright posture. pt fatigues quickly ADL Comments: pt is poor history demonstrating decr STM. pt asked a question and ~15 second forgot question all together. Pt slow to process questions and unable to verbalize reason for admission. PT SHOULD NOT MANAGE MEDICATION upon d/c home. Pt unable to since oral care. Pt brushing teeth without tooth paste and cued x2 prior to error. Pt unable to recognize error. Pt completing stairs this session.     OT Diagnosis:    OT Problem List:   OT Treatment Interventions:     OT Goals(Current goals can be found in the care plan section) Acute Rehab OT Goals Patient Stated Goal: to go home today  Visit Information  Last OT Received On: 09/27/13 Assistance Needed: +1 PT/OT/SLP Co-Evaluation/Treatment: Yes Reason for Co-Treatment: For patient/therapist safety PT goals addressed during session: Mobility/safety with mobility;Balance OT goals addressed during session: ADL's and self-care History of Present Illness: 80 y.o. male with ESRD on HD since May 2014 on TTS at [facility]. Pt admitted with AMS and seizure x2. At baseline he ambulated slowly with a cane at times, feeds, bathes and dresses himself and manages his own medications.        Prior [place] expects to be discharged to:: Private residence Living Arrangements: Spouse/significant other Available Help at Discharge: Family Type of Home: House Home Access: Stairs to enter Technical brewer of Steps: 10 Entrance Stairs-Rails: Can reach both Home Layout: Two level;Bed/bath upstairs Alternate Level Stairs-Number of Steps: stair lift to second story Alternate Level Stairs-Rails: Can reach both Home Equipment: Walker - 2 wheels;Crutches;Cane - single point Additional Comments: question accuracy of patient self reporting.  Per note patient manages personal medication independently Prior Function Level of Independence: Independent Comments: has 24/7  caregivers (family) Communication Communication: No difficulties Dominant Hand: Right         Vision/Perception Vision - History Baseline Vision: Wears glasses only for reading   Cognition  Cognition Arousal/Alertness: Awake/alert Behavior During Therapy: WFL for tasks assessed/performed    Extremity/Trunk Assessment Upper Extremity Assessment Upper Extremity Assessment: Overall WFL for tasks assessed Lower Extremity Assessment Lower Extremity Assessment: Generalized weakness Cervical / Trunk Assessment Cervical / Trunk Assessment: Kyphotic (anterior lean)     Mobility Bed Mobility Overal bed mobility: Needs Assistance Bed Mobility: Supine to Sit Supine to sit: Supervision General bed mobility comments: increased time and max VCs to come to EOB Transfers Overall transfer level: Needs assistance Equipment used: Rolling walker (2 wheeled) Transfers: Sit to/from Stand Sit to Stand: Min assist General transfer comment: Assist to block foot from slinding, VCs for hand placement and positioning, assist to stabilize rw.  Patient requires significant time to perform transfers.  Max VCs for safety with stand to sit using RW.       Exercise     Balance     End of Session OT - End of Session Activity Tolerance: Patient tolerated treatment well Patient left: in chair;with call bell/phone within reach;with chair alarm set Nurse Communication: Mobility status;Precautions  GO     Parke Poisson B 09/28/2013, 10:04 AM Pager: [phone]

## 2013-09-28 NOTE — Progress Notes (Signed)
OT NOTE  MD VAN- Please speak with patient's family about medication management. Pt is not safe at this time to resume this task. Pt with cognitive deficits.    Mateo Flow   OTR/L Pager: [phone] Office: [phone] .

## 2013-09-28 NOTE — Care Management Note (Unsigned)
    Page 1 of 2   09/28/2013     11:46:00 AM   CARE MANAGEMENT NOTE 09/28/2013  Patient:  [facility]   Account Number:  [id number]  Date Initiated:  09/28/2013  Documentation initiated by:  Lorne Skeens  Subjective/Objective Assessment:   Patient admitted with seizures. Lives at home with wife. Patient has been with [facility]     Action/Plan:   Will follow for discharge.   Anticipated DC Date:  09/29/2013   Anticipated DC Plan:  [place]  CM consult      Choice offered to / List presented to:  C-3 Spouse   DME arranged  [place]      DME agency  [place] arranged  HH-1 RN  HH-2 PT  HH-3 OT  HH-4 NURSE'S AIDE  HH-6 SOCIAL WORKER      Status of service:  Completed, signed off Medicare Important Message given?   (If response is "NO", the following Medicare IM given date fields will be blank) Date Medicare IM given:   Date Additional Medicare IM given:    Discharge Disposition:    Per UR Regulation:    If discussed at Long Length of Stay Meetings, dates discussed:    Comments:  09/28/13 [place], MSN, CM- Met with patient and family to discuss home health needs.  Per family, patient has all necessary equipment except a wheelchair. [place] [facility] was notified of wheelchair order for discharge home tomorrow.  Mary with [facility] was notified of pending discharge and services ordered.

## 2013-09-28 NOTE — Progress Notes (Signed)
Subjective: EEG negative for seizures  Exam: Filed Vitals:   09/28/13 1500  BP: 127/67  Pulse: 78  Temp: 97.8 F (36.6 C)  Resp: 16   Gen: In bed, NAD MS: Awake, alert, oriented to place and year YT:KPTWS, EOMI Motor: 5/5 throughotu  Impression: 80 yo M with AMS. His VPA level was high and I suspect it was responsible for the confusion that he presented with. His mental status appears to have improved considerably.  24 hour continuous EEG was negative for any intermittent seizures.   I suspect that the MRI changes are small vessel disease given his ESRD. HIV may be reasonable to send, but I think further workup beyond that would be low yield.   I suspect that his leukopenia is Depakote related, also given that it was likely responsible for this hospitalization, I would favorusing vimpat monotherapy as he had been tolerating it now.   Recommendations: 1) D/C depakote  2) continue vimpat 100mg  BID 3) HIV antibody 4) Neurology will sign off at this time, please call with any further questions.   , MD [facility] [phone]  If 7pm- 7am, please page neurology on call at [phone].

## 2013-09-29 DIAGNOSIS — T426X1A Poisoning by other antiepileptic and sedative-hypnotic drugs, accidental (unintentional), initial encounter: Secondary | ICD-10-CM

## 2013-09-29 DIAGNOSIS — T4275XA Adverse effect of unspecified antiepileptic and sedative-hypnotic drugs, initial encounter: Secondary | ICD-10-CM

## 2013-09-29 LAB — RENAL FUNCTION PANEL
ALBUMIN: 3.1 g/dL — AB (ref 3.5–5.2)
BUN: 31 mg/dL — AB (ref 6–23)
CO2: 24 mEq/L (ref 19–32)
Calcium: 9.2 mg/dL (ref 8.4–10.5)
Chloride: 99 mEq/L (ref 96–112)
Creatinine, Ser: 5.94 mg/dL — ABNORMAL HIGH (ref 0.50–1.35)
GFR calc non Af Amer: 8 mL/min — ABNORMAL LOW (ref >90)
GFR, EST AFRICAN AMERICAN: 9 mL/min — AB (ref >90)
Glucose, Bld: 134 mg/dL — ABNORMAL HIGH (ref 70–99)
PHOSPHORUS: 4.6 mg/dL (ref 2.3–4.6)
Potassium: 4.9 mEq/L (ref 3.7–5.3)
SODIUM: 140 meq/L (ref 137–147)

## 2013-09-29 LAB — CBC
HCT: 31.4 % — ABNORMAL LOW (ref 39.0–52.0)
Hemoglobin: 10.6 g/dL — ABNORMAL LOW (ref 13.0–17.0)
MCH: 35.2 pg — ABNORMAL HIGH (ref 26.0–34.0)
MCHC: 33.8 g/dL (ref 30.0–36.0)
MCV: 104.3 fL — ABNORMAL HIGH (ref 78.0–100.0)
Platelets: 72 10*3/uL — ABNORMAL LOW (ref 150–400)
RBC: 3.01 MIL/uL — ABNORMAL LOW (ref 4.22–5.81)
RDW: 13.5 % (ref 11.5–15.5)
WBC: 3.8 10*3/uL — ABNORMAL LOW (ref 4.0–10.5)

## 2013-09-29 LAB — GLUCOSE, CAPILLARY
Glucose-Capillary: 109 mg/dL — ABNORMAL HIGH (ref 70–99)
Glucose-Capillary: 128 mg/dL — ABNORMAL HIGH (ref 70–99)

## 2013-09-29 MED ORDER — DARBEPOETIN ALFA-POLYSORBATE 60 MCG/0.3ML IJ SOLN: 60.0000 ug | INTRAMUSCULAR | Status: DC

## 2013-09-29 MED ORDER — DOXERCALCIFEROL 4 MCG/2ML IV SOLN: 4.0000 ug | INTRAVENOUS | Status: DC

## 2013-09-29 MED ORDER — DOXERCALCIFEROL 4 MCG/2ML IV SOLN
INTRAVENOUS | Status: AC
  Administered 2013-09-29: 4 ug via INTRAVENOUS
  Filled 2013-09-29: qty 2

## 2013-09-29 MED ORDER — RENA-VITE PO TABS: 1.0000 | ORAL_TABLET | Freq: Every day | ORAL | Status: DC

## 2013-09-29 MED ORDER — FOLIC ACID 1 MG PO TABS: 1.0000 mg | ORAL_TABLET | Freq: Every day | ORAL | Status: DC

## 2013-09-29 MED ORDER — SODIUM CHLORIDE 0.9 % IV SOLN: 62.5000 mg | INTRAVENOUS | Status: DC

## 2013-09-29 MED ORDER — HEPARIN SODIUM (PORCINE) 1000 UNIT/ML DIALYSIS
20.0000 [IU]/kg | INTRAMUSCULAR | Status: DC | PRN
Start: 2013-09-29 — End: 2013-09-29
  Filled 2013-09-29: qty 2

## 2013-09-29 MED ORDER — LACOSAMIDE 100 MG PO TABS: 100.0000 mg | ORAL_TABLET | Freq: Two times a day (BID) | ORAL | Status: DC

## 2013-09-29 NOTE — Progress Notes (Signed)
Patient ID: [name], male   DOB: 01-10-1932, 80 y.o.   MRN: [id number]  [name] Progress Note    Subjective:   No complaints   Objective:   BP 145/69  Pulse 75  Temp(Src) 98.7 F (37.1 C) (Oral)  Resp 18  Ht 6' (1.829 m)  Wt 94 kg (207 lb 3.7 oz)  BMI 28.10 kg/m2  SpO2 96%  Intake/Output:     Intake/Output this shift:    Weight change:   Physical Exam: Gen:WD elderly AAM in NAD,  CVS:no rub Resp:cta SWN:IOEVOJ Ext:no edema, RUE AVG +T/B Neuro: awake but slowed mentation.  Would not answer questions about date, year, current event, only oriented to person and place  Labs: BMET  Recent Labs Lab 09/25/13 1900 09/26/13 0900 09/27/13 0539 09/29/13 0504  NA 137 139 137 140  K 3.6* 3.8 3.9 4.9  CL 93* 97 96 99  CO2 27 25 26 24   GLUCOSE 135* 94 109* 134*  BUN 29* 36* 47* 31*  CREATININE 4.29* 5.33* 6.80* 5.94*  ALBUMIN 3.4*  --   --  3.1*  CALCIUM 8.7 8.3* 8.6 9.2  PHOS  --   --   --  4.6   CBC  Recent Labs Lab 09/25/13 1900 09/26/13 0900 09/27/13 0539 09/29/13 0504  WBC 3.8* 2.9* 2.4* 3.8*  HGB 10.6* 10.0* 10.2* 10.6*  HCT 31.4* 30.0* 30.2* 31.4*  MCV 105.7* 105.6* 103.8* 104.3*  PLT 82* 80* 78* 72*    @IMGRELPRIORS @ Medications:    . allopurinol  100 mg Oral 2 times weekly  . amLODipine  10 mg Oral Daily  . calcium carbonate  1 tablet Oral TID WC  . darbepoetin (ARANESP) injection - DIALYSIS  60 mcg Intravenous Q Thu-HD  . doxercalciferol  4 mcg Intravenous Q T,Th,Sa-HD  . ferric gluconate (FERRLECIT/NULECIT) IV  62.5 mg Intravenous Q Thu-HD  . folic acid  1 mg Oral Daily  . insulin aspart  0-5 Units Subcutaneous QHS  . insulin aspart  0-9 Units Subcutaneous TID WC  . irbesartan  300 mg Oral Daily  . lacosamide  100 mg Oral BID  . multivitamin  1 tablet Oral QHS  . pantoprazole  40 mg Oral BID     Dialysis Orders: [facility] EDW 93.5 2 K 2.25 Ca 450/A 1.5  Epogen venofer 50/week and Hectorol 4  Recent  labs: Hgb 9.8 Dec and 9.9   Asessment/Plan:  1. AMS - work up per primary and neuro without reason for ongoing delirium/disorientation; initial CXR neg, UA neg, Head CT and MRI of brain with chronic changes, no ongoing sz activity on EEG, EKG sinus rhythm. CBC remarkable in that WBC (3.8 > 2,4)lower than usual and trending down as are platelets (82>78). Neuro has signed off 1. ?back at baseline per Dr. 2. ESRD - TTS - K 3.9 today so will use 4 K bath;  3. Hypertension/volume -leaving a little below EDW at times - reassess edw while here; CXR on admission neg volume 4. Anemia - Hgb trending down since admission - Dose Aranesp 50 today and follow; continue weekly IV Fe 5. Metabolic bone disease - Continue Hectorol 4, binders (note when I queried him about binders - he told me he wasn't taking any) 6. Nutrition - Dysphagia 3 diet for now - add vitamins and fluid restriction 7. Type 2 DM - SSI 8. Seizure disorder - depakote level elevated on admission post HD - now in therapeutic range 9. Dispo- stable  for discharge after HD per primary svc  [name] A 09/29/2013, 11:47 AM

## 2013-09-29 NOTE — Progress Notes (Signed)
Called by Monitor Tech d/t pt. Having "missed beats" on telemetry. Longest- 1.43. Dr. Benjamine Mola on unit and notfied; also notified HD RN as pt. Is currently in HD. Will monitor.

## 2013-09-29 NOTE — Discharge Summary (Signed)
Physician Discharge Summary  [name] DXA:[id number] DOB: 11-12-1931 DOA: 09/25/2013  PCP: Cecille Aver, MD  Admit date: 09/25/2013 Discharge date: 09/29/2013  Time spent: 35 minutes  Recommendations for Outpatient Follow-up:  1. Home health 2. Continue HD  Discharge Diagnoses:  Principal Problem:   Altered mental status Active Problems:   Anemia in chronic kidney disease   Seizures   Altered mental state   ESRD on dialysis   Secondary hyperparathyroidism (of renal origin)   Diabetes   Valproic acid toxicity   Discharge Condition: stable  Diet recommendation: renal  Filed Weights   09/27/13 1205 09/29/13 1140 09/29/13 1617  Weight: 93.9 kg (207 lb 0.2 oz) 94 kg (207 lb 3.7 oz) 93.1 kg (205 lb 4 oz)    History of present illness:  [name] is a 80 y.o. male who is brought in by his family for AMS. Patient had been acting abnormal before dialysis today. Essentially he is AAOx3, but per family has significant personality changes. Patient intermittently answering questions but states he hasn't noticed any difference   Hospital Course:  AMS - ?CVA vs seizure vas medications (most likely medications)  -VPA- will d/c as this could be decreasing plt/wbc and patient was not taking correctly  -MRI negative for CVA but + for atrophy  -continuous EEG- did not show seizures  -neuro consult  Per son, metal status improved but patient still confused about year  -PT/OT eval- home health as family declines SNF   ESRD - called nephro for HD   H/o DM - SSI   H/o Seizure disorder, change to vimpat   Procedures:  EEG  Consultations:  Neuro  renal  Discharge Exam: Filed Vitals:   09/29/13 1617  BP: 138/68  Pulse: 90  Temp: 97.9 F (36.6 C)  Resp: 16    General: pleasant/cooperative Cardiovascular: rrr Respiratory: clear anterior  Discharge Instructions      Discharge Orders   Future Appointments Provider Department Dept Phone   10/17/2013  2:00 PM Suanne Marker, MD [facility] [phone]   Future Orders Complete By Expires   Discharge instructions  As directed    Comments:     Continue HD T/Th/sat DYS 3 diet CBC 1 week re plts   Increase activity slowly  As directed        Medication List    STOP taking these medications       diphenhydramine-acetaminophen 25-500 MG Tabs  Commonly known as:  TYLENOL PM     divalproex 500 MG 24 hr tablet  Commonly known as:  DEPAKOTE ER     HYDROcodone-acetaminophen 5-325 MG per tablet  Commonly known as:  NORCO/VICODIN     telmisartan 80 MG tablet  Commonly known as:  MICARDIS      TAKE these medications       amLODipine 10 MG tablet  Commonly known as:  NORVASC  Take 10 mg by mouth Daily.     calcium carbonate 500 MG chewable tablet  Commonly known as:  TUMS - dosed in mg elemental calcium  Chew 1 tablet (200 mg of elemental calcium total) by mouth 3 (three) times daily with meals.     darbepoetin 60 MCG/0.3ML Soln injection  Commonly known as:  ARANESP  Inject 0.3 mLs (60 mcg total) into the vein every Thursday with hemodialysis.     doxercalciferol 4 MCG/2ML injection  Commonly known as:  HECTOROL  Inject 2 mLs (4 mcg total) into the vein Every Tuesday,Thursday,and Saturday with  dialysis.     folic acid 1 MG tablet  Commonly known as:  FOLVITE  Take 1 tablet (1 mg total) by mouth daily.     Lacosamide 100 MG Tabs  Take 1 tablet (100 mg total) by mouth 2 (two) times daily.     multivitamin Tabs tablet  Take 1 tablet by mouth at bedtime.     pantoprazole 20 MG tablet  Commonly known as:  PROTONIX  Take 40 mg by mouth 2 (two) times daily.     sodium chloride 0.9 % SOLN 100 mL with ferric gluconate 12.5 MG/ML SOLN 62.5 mg  Inject 62.5 mg into the vein every Thursday with hemodialysis.       No Known Allergies Follow-up Information   Follow up with GOLDSBOROUGH,KELLIE A, MD In 1 week.   Specialty:  Nephrology   Contact  information:   [address] [place] [address] [phone]       Follow up with [facility]. (for wheelchair and cusion)    Contact information:   [address] [place] [address] [phone]       Follow up with [facility]. (for home health physical and occupational therapy, nurse, aide, and social worker)    Contact information:   [address] [place] [address] [phone]        The results of significant diagnostics from this hospitalization (including imaging, microbiology, ancillary and laboratory) are listed below for reference.    Significant Diagnostic Studies: Dg Chest 2 View  09/25/2013   CLINICAL DATA:  Altered mental status.  EXAM: CHEST  2 VIEW  COMPARISON:  01/24/2013  FINDINGS: Two views of the chest were obtained. Mild fullness in the right peritracheal region is unchanged. Heart size is normal. No evidence for airspace disease or edema. No evidence for pleural effusions.  IMPRESSION: No acute chest findings.   Electronically Signed   By: Richarda Overlie M.D.   On: 09/25/2013 21:37   Ct Head Wo Contrast  09/25/2013   CLINICAL DATA:  Altered level of consciousness. Altered mental status.  EXAM: CT HEAD WITHOUT CONTRAST  TECHNIQUE: Contiguous axial images were obtained from the base of the skull through the vertex without intravenous contrast.  COMPARISON:  01/15/2013  FINDINGS: There is no evidence of intracranial hemorrhage, brain edema, or other signs of acute infarction. There is no evidence of intracranial mass lesion or mass effect. No abnormal extraaxial fluid collections are identified.  Mild cerebral atrophy is stable. Extensive chronic small vessel disease is also unchanged in appearance. Ventricles are stable in size. No evidence of skull fracture or other significant bone abnormality.  IMPRESSION: No acute intracranial findings.  Stable chronic small vessel disease and mild cerebral atrophy.    Electronically Signed   By: Myles Rosenthal M.D.   On: 09/25/2013 20:04   Mr Brain Wo Contrast  09/27/2013   CLINICAL DATA:  Altered mental status.  EXAM: MRI HEAD WITHOUT CONTRAST  TECHNIQUE: Multiplanar, multiecho pulse sequences of the brain and surrounding structures were obtained without intravenous contrast.  COMPARISON:  Head CT 09/25/2013 and brain MRI 01/16/2013  FINDINGS: Images are mildly to moderately degraded by motion artifact.  Again seen are multiple punctate foci of susceptibility artifact within both cerebral hemispheres, greatest in the occipital lobes and posterior left temporal lobe. These are have slightly increased in number from the prior study and are compatible with remote microhemorrhages. There is no acute infarct. Confluent T2 hyperintensity  within the subcortical and deep cerebral white matter bilaterally has increased significantly from the prior MRI, particularly in the frontal lobes. There is moderate cerebral atrophy, also increased from the prior MRI. There is no evidence of mass, midline shift, or extra-axial fluid collection.  Major intracranial vascular flow voids are unremarkable. Prior bilateral cataract surgery is noted. Mild bilateral ethmoid and maxillary sinus mucosal thickening is noted. Mastoid air cells are clear. Limited visualization of the upper cervical spine demonstrates moderate multilevel spondylosis.  IMPRESSION: 1. No evidence of acute infarct. 2. Significant interval progression of white matter disease from prior MRI. While this may represent progression of chronic microvascular ischemia, other etiologies, such as metabolic and infectious encephalopathies (including HIV), are not excluded. 3. Mild interval progression of cerebral atrophy and bilateral cerebral microhemorrhages.   Electronically Signed   By: Sebastian Ache   On: 09/27/2013 19:53    Microbiology: No results found for this or any previous visit (from the past 240 hour(s)).   Labs: Basic  Metabolic Panel:  Recent Labs Lab 09/25/13 1900 09/26/13 0900 09/27/13 0539 09/29/13 0504  NA 137 139 137 140  K 3.6* 3.8 3.9 4.9  CL 93* 97 96 99  CO2 27 25 26 24   GLUCOSE 135* 94 109* 134*  BUN 29* 36* 47* 31*  CREATININE 4.29* 5.33* 6.80* 5.94*  CALCIUM 8.7 8.3* 8.6 9.2  PHOS  --   --   --  4.6   Liver Function Tests:  Recent Labs Lab 09/25/13 1900 09/29/13 0504  AST 13  --   ALT 5  --   ALKPHOS 40  --   BILITOT 0.3  --   PROT 7.4  --   ALBUMIN 3.4* 3.1*   No results found for this basename: LIPASE, AMYLASE,  in the last 168 hours No results found for this basename: AMMONIA,  in the last 168 hours CBC:  Recent Labs Lab 09/25/13 1900 09/26/13 0900 09/27/13 0539 09/29/13 0504  WBC 3.8* 2.9* 2.4* 3.8*  HGB 10.6* 10.0* 10.2* 10.6*  HCT 31.4* 30.0* 30.2* 31.4*  MCV 105.7* 105.6* 103.8* 104.3*  PLT 82* 80* 78* 72*   Cardiac Enzymes: No results found for this basename: CKTOTAL, CKMB, CKMBINDEX, TROPONINI,  in the last 168 hours BNP: BNP (last 3 results) No results found for this basename: PROBNP,  in the last 8760 hours CBG:  Recent Labs Lab 09/28/13 0653 09/28/13 1142 09/28/13 1644 09/28/13 2213 09/29/13 0635  GLUCAP 87 154* 185* 127* 128*       Signed:  10/01/13, Cintia Gleed  [facility] 10/01/2013, 2:40 PM

## 2013-09-30 NOTE — Progress Notes (Signed)
   CARE MANAGEMENT NOTE 09/30/2013  Patient:  [facility]   Account Number:  [id number]  Date Initiated:  09/28/2013  Documentation initiated by:  Lorne Skeens  Subjective/Objective Assessment:   Patient admitted with seizures. Lives at home with wife. Patient has been with [facility]     Action/Plan:   Will follow for discharge.   Anticipated DC Date:  09/29/2013   Anticipated DC Plan:  [place]  CM consult      Choice offered to / List presented to:  C-3 Spouse   DME arranged  [place]      DME agency  [place] arranged  HH-1 RN  HH-2 PT  HH-3 OT  HH-4 NURSE'S AIDE  HH-6 SOCIAL WORKER      Status of service:  Completed, signed off Medicare Important Message given?   (If response is "NO", the following Medicare IM given date fields will be blank) Date Medicare IM given:   Date Additional Medicare IM given:    Discharge Disposition:  [place]  Per UR Regulation:    If discussed at Long Length of Stay Meetings, dates discussed:    Comments:  09/29/13 18:20 CM called by RN as wheelchair and cusion was not delivered to room.  CM spoke with pt and family in room to verify home address and contact; called [facility] and [facility] will deliver to home of pt.  No other CM needs were communicated.  Mariane Masters, BSN, [place] [phone].  09/28/13 [place] RN, MSN, CM- Met with patient and family to discuss home health needs.  Per family, patient has all necessary equipment except a wheelchair. [place] [facility] was notified of wheelchair order for discharge home tomorrow.  Mary with [facility] was notified of pending discharge and services ordered.

## 2013-10-01 ENCOUNTER — Emergency Department (HOSPITAL_COMMUNITY): Payer: Medicare Other

## 2013-10-01 ENCOUNTER — Encounter (HOSPITAL_COMMUNITY): Payer: Self-pay | Admitting: Emergency Medicine

## 2013-10-01 ENCOUNTER — Inpatient Hospital Stay (HOSPITAL_COMMUNITY): Payer: Medicare Other

## 2013-10-01 ENCOUNTER — Inpatient Hospital Stay (HOSPITAL_COMMUNITY)
Admission: EM | Admit: 2013-10-01 | Discharge: 2013-10-09 | DRG: 070 | Disposition: A | Discharge status: Skilled Nursing Facility | Payer: Medicare Other | Attending: Internal Medicine | Admitting: Internal Medicine

## 2013-10-01 DIAGNOSIS — G40909 Epilepsy, unspecified, not intractable, without status epilepticus: Secondary | ICD-10-CM | POA: Diagnosis present

## 2013-10-01 DIAGNOSIS — N189 Chronic kidney disease, unspecified: Secondary | ICD-10-CM

## 2013-10-01 DIAGNOSIS — I120 Hypertensive chronic kidney disease with stage 5 chronic kidney disease or end stage renal disease: Secondary | ICD-10-CM | POA: Diagnosis present

## 2013-10-01 DIAGNOSIS — R55 Syncope and collapse: Secondary | ICD-10-CM

## 2013-10-01 DIAGNOSIS — D759 Disease of blood and blood-forming organs, unspecified: Secondary | ICD-10-CM | POA: Diagnosis present

## 2013-10-01 DIAGNOSIS — E119 Type 2 diabetes mellitus without complications: Secondary | ICD-10-CM

## 2013-10-01 DIAGNOSIS — D61818 Other pancytopenia: Secondary | ICD-10-CM

## 2013-10-01 DIAGNOSIS — N039 Chronic nephritic syndrome with unspecified morphologic changes: Secondary | ICD-10-CM

## 2013-10-01 DIAGNOSIS — M129 Arthropathy, unspecified: Secondary | ICD-10-CM | POA: Diagnosis present

## 2013-10-01 DIAGNOSIS — E11649 Type 2 diabetes mellitus with hypoglycemia without coma: Secondary | ICD-10-CM

## 2013-10-01 DIAGNOSIS — E872 Acidosis: Secondary | ICD-10-CM

## 2013-10-01 DIAGNOSIS — R32 Unspecified urinary incontinence: Secondary | ICD-10-CM | POA: Diagnosis present

## 2013-10-01 DIAGNOSIS — R4182 Altered mental status, unspecified: Secondary | ICD-10-CM | POA: Diagnosis present

## 2013-10-01 DIAGNOSIS — K5521 Angiodysplasia of colon with hemorrhage: Secondary | ICD-10-CM

## 2013-10-01 DIAGNOSIS — K922 Gastrointestinal hemorrhage, unspecified: Secondary | ICD-10-CM

## 2013-10-01 DIAGNOSIS — R569 Unspecified convulsions: Secondary | ICD-10-CM | POA: Diagnosis present

## 2013-10-01 DIAGNOSIS — M109 Gout, unspecified: Secondary | ICD-10-CM

## 2013-10-01 DIAGNOSIS — G9341 Metabolic encephalopathy: Principal | ICD-10-CM | POA: Diagnosis present

## 2013-10-01 DIAGNOSIS — I10 Essential (primary) hypertension: Secondary | ICD-10-CM

## 2013-10-01 DIAGNOSIS — Z992 Dependence on renal dialysis: Secondary | ICD-10-CM

## 2013-10-01 DIAGNOSIS — A498 Other bacterial infections of unspecified site: Secondary | ICD-10-CM | POA: Diagnosis present

## 2013-10-01 DIAGNOSIS — F0390 Unspecified dementia without behavioral disturbance: Secondary | ICD-10-CM | POA: Diagnosis present

## 2013-10-01 DIAGNOSIS — D631 Anemia in chronic kidney disease: Secondary | ICD-10-CM | POA: Diagnosis present

## 2013-10-01 DIAGNOSIS — Z79899 Other long term (current) drug therapy: Secondary | ICD-10-CM

## 2013-10-01 DIAGNOSIS — R627 Adult failure to thrive: Secondary | ICD-10-CM

## 2013-10-01 DIAGNOSIS — D696 Thrombocytopenia, unspecified: Secondary | ICD-10-CM

## 2013-10-01 DIAGNOSIS — E8729 Other acidosis: Secondary | ICD-10-CM

## 2013-10-01 DIAGNOSIS — N2581 Secondary hyperparathyroidism of renal origin: Secondary | ICD-10-CM

## 2013-10-01 DIAGNOSIS — D6959 Other secondary thrombocytopenia: Secondary | ICD-10-CM | POA: Diagnosis present

## 2013-10-01 DIAGNOSIS — T426X1A Poisoning by other antiepileptic and sedative-hypnotic drugs, accidental (unintentional), initial encounter: Secondary | ICD-10-CM

## 2013-10-01 DIAGNOSIS — N390 Urinary tract infection, site not specified: Secondary | ICD-10-CM

## 2013-10-01 DIAGNOSIS — N186 End stage renal disease: Secondary | ICD-10-CM | POA: Diagnosis present

## 2013-10-01 HISTORY — DX: Unspecified convulsions: R56.9

## 2013-10-01 LAB — INFLUENZA PANEL BY PCR (TYPE A & B)
H1N1FLUPCR: NOT DETECTED
Influenza A By PCR: NEGATIVE
Influenza B By PCR: NEGATIVE

## 2013-10-01 LAB — CBC WITH DIFFERENTIAL/PLATELET
Basophils Absolute: 0 10*3/uL (ref 0.0–0.1)
Basophils Relative: 0 % (ref 0–1)
EOS ABS: 0.1 10*3/uL (ref 0.0–0.7)
EOS PCT: 1 % (ref 0–5)
HCT: 29 % — ABNORMAL LOW (ref 39.0–52.0)
HEMOGLOBIN: 9.6 g/dL — AB (ref 13.0–17.0)
LYMPHS ABS: 0.9 10*3/uL (ref 0.7–4.0)
Lymphocytes Relative: 15 % (ref 12–46)
MCH: 34.9 pg — AB (ref 26.0–34.0)
MCHC: 33.1 g/dL (ref 30.0–36.0)
MCV: 105.5 fL — AB (ref 78.0–100.0)
MONOS PCT: 18 % — AB (ref 3–12)
Monocytes Absolute: 1.1 10*3/uL — ABNORMAL HIGH (ref 0.1–1.0)
Neutro Abs: 3.9 10*3/uL (ref 1.7–7.7)
Neutrophils Relative %: 66 % (ref 43–77)
Platelets: 82 10*3/uL — ABNORMAL LOW (ref 150–400)
RBC: 2.75 MIL/uL — AB (ref 4.22–5.81)
RDW: 13.9 % (ref 11.5–15.5)
WBC: 5.9 10*3/uL (ref 4.0–10.5)

## 2013-10-01 LAB — URINE MICROSCOPIC-ADD ON

## 2013-10-01 LAB — COMPREHENSIVE METABOLIC PANEL
ALK PHOS: 42 U/L (ref 39–117)
ALT: 5 U/L (ref 0–53)
AST: 13 U/L (ref 0–37)
Albumin: 3 g/dL — ABNORMAL LOW (ref 3.5–5.2)
BUN: 36 mg/dL — AB (ref 6–23)
CALCIUM: 9.2 mg/dL (ref 8.4–10.5)
CO2: 24 mEq/L (ref 19–32)
Chloride: 97 mEq/L (ref 96–112)
Creatinine, Ser: 6.65 mg/dL — ABNORMAL HIGH (ref 0.50–1.35)
GFR calc non Af Amer: 7 mL/min — ABNORMAL LOW (ref >90)
GFR, EST AFRICAN AMERICAN: 8 mL/min — AB (ref >90)
GLUCOSE: 167 mg/dL — AB (ref 70–99)
Potassium: 4.3 mEq/L (ref 3.7–5.3)
Sodium: 136 mEq/L — ABNORMAL LOW (ref 137–147)
TOTAL PROTEIN: 6.9 g/dL (ref 6.0–8.3)
Total Bilirubin: 0.4 mg/dL (ref 0.3–1.2)

## 2013-10-01 LAB — AMMONIA: AMMONIA: 20 umol/L (ref 11–60)

## 2013-10-01 LAB — OCCULT BLOOD X 1 CARD TO LAB, STOOL: Fecal Occult Bld: NEGATIVE

## 2013-10-01 LAB — URINALYSIS, ROUTINE W REFLEX MICROSCOPIC
Bilirubin Urine: NEGATIVE
GLUCOSE, UA: 100 mg/dL — AB
Ketones, ur: NEGATIVE mg/dL
Nitrite: NEGATIVE
PROTEIN: 30 mg/dL — AB
Specific Gravity, Urine: 1.015 (ref 1.005–1.030)
Urobilinogen, UA: 0.2 mg/dL (ref 0.0–1.0)
pH: 8.5 — ABNORMAL HIGH (ref 5.0–8.0)

## 2013-10-01 LAB — GLUCOSE, CAPILLARY
Glucose-Capillary: 117 mg/dL — ABNORMAL HIGH (ref 70–99)
Glucose-Capillary: 212 mg/dL — ABNORMAL HIGH (ref 70–99)

## 2013-10-01 LAB — LACTIC ACID, PLASMA: Lactic Acid, Venous: 0.8 mmol/L (ref 0.5–2.2)

## 2013-10-01 MED ORDER — ACETAMINOPHEN 325 MG PO TABS
650.0000 mg | ORAL_TABLET | Freq: Four times a day (QID) | ORAL | Status: DC | PRN
  Administered 2013-10-06: 650 mg via ORAL
  Filled 2013-10-01 (×2): qty 2

## 2013-10-01 MED ORDER — RENA-VITE PO TABS
1.0000 | ORAL_TABLET | Freq: Every day | ORAL | Status: DC
  Administered 2013-10-01 – 2013-10-09 (×9): 1 via ORAL
  Filled 2013-10-01 (×9): qty 1

## 2013-10-01 MED ORDER — DOXERCALCIFEROL 4 MCG/2ML IV SOLN
4.0000 ug | INTRAVENOUS | Status: DC
  Administered 2013-10-04: 2 ug via INTRAVENOUS
  Administered 2013-10-08: 4 ug via INTRAVENOUS
  Filled 2013-10-01 (×3): qty 2

## 2013-10-01 MED ORDER — AMLODIPINE BESYLATE 10 MG PO TABS
10.0000 mg | ORAL_TABLET | Freq: Every day | ORAL | Status: DC
  Administered 2013-10-02 – 2013-10-09 (×7): 10 mg via ORAL
  Filled 2013-10-01 (×8): qty 1

## 2013-10-01 MED ORDER — CEFTRIAXONE SODIUM 1 G IJ SOLR
1.0000 g | INTRAMUSCULAR | Status: DC
  Administered 2013-10-01 – 2013-10-03 (×3): 1 g via INTRAVENOUS
  Filled 2013-10-01 (×4): qty 10

## 2013-10-01 MED ORDER — CALCIUM CARBONATE ANTACID 500 MG PO CHEW
1.0000 | CHEWABLE_TABLET | Freq: Three times a day (TID) | ORAL | Status: DC
  Administered 2013-10-02 – 2013-10-09 (×17): 200 mg via ORAL
  Filled 2013-10-01 (×25): qty 1

## 2013-10-01 MED ORDER — HEPARIN SODIUM (PORCINE) 5000 UNIT/ML IJ SOLN
5000.0000 [IU] | Freq: Three times a day (TID) | INTRAMUSCULAR | Status: DC
  Filled 2013-10-01 (×2): qty 1

## 2013-10-01 MED ORDER — LORAZEPAM 2 MG/ML IJ SOLN: 1.0000 mg | Freq: Four times a day (QID) | INTRAMUSCULAR | Status: DC | PRN

## 2013-10-01 MED ORDER — DARBEPOETIN ALFA-POLYSORBATE 60 MCG/0.3ML IJ SOLN
60.0000 ug | INTRAMUSCULAR | Status: DC
  Administered 2013-10-04: 60 ug via INTRAVENOUS
  Filled 2013-10-01: qty 0.3

## 2013-10-01 MED ORDER — INSULIN ASPART 100 UNIT/ML ~~LOC~~ SOLN
0.0000 [IU] | Freq: Three times a day (TID) | SUBCUTANEOUS | Status: DC
  Administered 2013-10-02: 1 [IU] via SUBCUTANEOUS
  Administered 2013-10-03: 2 [IU] via SUBCUTANEOUS
  Administered 2013-10-03: 1 [IU] via SUBCUTANEOUS
  Administered 2013-10-04: 2 [IU] via SUBCUTANEOUS
  Administered 2013-10-04 – 2013-10-05 (×2): 1 [IU] via SUBCUTANEOUS
  Administered 2013-10-05: 3 [IU] via SUBCUTANEOUS
  Administered 2013-10-06: 1 [IU] via SUBCUTANEOUS
  Administered 2013-10-06 (×2): 2 [IU] via SUBCUTANEOUS
  Administered 2013-10-07: 3 [IU] via SUBCUTANEOUS
  Administered 2013-10-07: 2 [IU] via SUBCUTANEOUS
  Administered 2013-10-07 – 2013-10-09 (×5): 1 [IU] via SUBCUTANEOUS

## 2013-10-01 MED ORDER — ACETAMINOPHEN 650 MG RE SUPP: 650.0000 mg | Freq: Four times a day (QID) | RECTAL | Status: DC | PRN

## 2013-10-01 MED ORDER — FOLIC ACID 1 MG PO TABS
1.0000 mg | ORAL_TABLET | Freq: Every day | ORAL | Status: DC
  Administered 2013-10-02 – 2013-10-09 (×8): 1 mg via ORAL
  Filled 2013-10-01 (×8): qty 1

## 2013-10-01 MED ORDER — LACOSAMIDE 50 MG PO TABS
100.0000 mg | ORAL_TABLET | Freq: Two times a day (BID) | ORAL | Status: DC
  Administered 2013-10-01 – 2013-10-07 (×12): 100 mg via ORAL
  Filled 2013-10-01 (×12): qty 2

## 2013-10-01 MED ORDER — SODIUM CHLORIDE 0.9 % IV SOLN
62.5000 mg | INTRAVENOUS | Status: DC
  Administered 2013-10-04: 62.5 mg via INTRAVENOUS
  Filled 2013-10-01 (×2): qty 5

## 2013-10-01 MED ORDER — PANTOPRAZOLE SODIUM 40 MG PO TBEC
40.0000 mg | DELAYED_RELEASE_TABLET | Freq: Two times a day (BID) | ORAL | Status: DC
  Administered 2013-10-01 – 2013-10-09 (×16): 40 mg via ORAL
  Filled 2013-10-01 (×17): qty 1

## 2013-10-01 NOTE — ED Notes (Signed)
Took off wedding ring due to fingers becoming edematous and gave ring to son at the bedside.

## 2013-10-01 NOTE — ED Provider Notes (Signed)
Patient seen/examined in the Emergency Department in conjunction with Resident Physician Provider Freida Busman Family reports increased weakness since recent hospital admission Exam : awake/alert, appears confused but follows commands, no arm/leg drift is noted, no focal weakness noted Plan: labs/imaging pending at this time    Joya Gaskins, MD 10/01/13 1707

## 2013-10-01 NOTE — H&P (Addendum)
[facility] History and Physical  [name] UEA:[id number] DOB: 1932-01-30 DOA: 10/01/2013  Referring physician: ED PCP: Cecille Aver, MD   Chief Complaint:  Persistent change in mental status and urinary incontinence  HPI:  80 year old male with history of end-stage renal disease on dialysis (tuesdays, Thursdays and Saturdays), seizure disorder, diet controlled diabetes, anemia of chronic kidney disease who was recently admitted and discharged 2 days back after being evaluated for altered mental status. Family had noticed significant changes in his personality. He was admitted to the hospital and worked up for altered mental status with negative workup for CVA and seizures. His valproate was discontinued as patient was developing pancytopenia. Neurology was consulted at that time. Patient's mental status had started to improve (although was not at baseline). Patient was seen by PT OT and was discharged home (family had declined for skilled nursing facility). Patient discharge home was stable for 1 day but again had Periodic confusion with episodes of poor communication and also had 2 episodes of urinary incontinence. Family denies any fevers or chills, nausea, vomiting or fall. Patient denies any chest discomfort, vision, shortness of breath, abdominal pain, diarrhea or dysuria. Denies any pain in his extremities or weakness. However family he appears more weak and is unable to ambulate by himself. Wife has also noticed fine tremors in his hands.  Course in the ED Patient's vitals were stable. Blood work done showed anemia and, cytopenia. BUN was 36 with creatinine of 6.65. Blood was 167. A chest x-ray done was unremarkable. PCR was ordered. [facility] the consulted for admission to medical floor.   Review of Systems:  Constitutional: Denies fever, chills, diaphoresis, appetite change and fatigue.  HEENT: Denies photophobia, eye pain, redness, hearing loss, ear pain,  congestion, sore throat, rhinorrhea, sneezing, mouth sores, trouble swallowing, neck pain, neck stiffness and tinnitus.   Respiratory: Denies SOB, DOE, cough, chest tightness,  and wheezing.   Cardiovascular: Denies chest pain, palpitations and leg swelling.  Gastrointestinal: Denies nausea, vomiting, abdominal pain, diarrhea, constipation, blood in stool and abdominal distention.  Genitourinary: Has urinary incontinence, Denies dysuria, urgency, frequency, hematuria, flank pain and difficulty urinating.  Endocrine: Denies polyuria, polydipsia. Musculoskeletal: Denies myalgias, back pain, joint swelling, arthralgias , unsteady gait Skin: Denies pallor, rash and wound.  Neurological: Denies dizziness, seizures, syncope, weakness, light-headedness, numbness and headaches.  Psychiatric/Behavioral:  mood changes, confusion, nervousness, sleep disturbance and agitation   Past Medical History  Diagnosis Date  . Hypertension   . Diabetes mellitus without complication   . Arthritis   . Renal disorder     chronic renal disease  . History of blood transfusion   . GI bleed   . History of blood transfusion    Past Surgical History  Procedure Laterality Date  . Esophagogastroduodenoscopy  10/03/2012    Procedure: ESOPHAGOGASTRODUODENOSCOPY (EGD);  Surgeon: Theda Belfast, MD;  Location[facility] ENDOSCOPY;  Service: Endoscopy;  Laterality: N/A;  . Colonoscopy  10/04/2012    Procedure: COLONOSCOPY;  Surgeon: Theda Belfast, MD;  Location: [facility] ENDOSCOPY;  Service: Endoscopy;  Laterality: N/A;  . Colonoscopy with esophagogastroduodenoscopy (egd) Left 11/30/2012    Procedure: COLONOSCOPY WITH ESOPHAGOGASTRODUODENOSCOPY (EGD);  Surgeon: Theda Belfast, MD;  Location: [facility] ENDOSCOPY;  Service: Endoscopy;  Laterality: Left;   Social History:  reports that he has never smoked. He does not have any smokeless tobacco history on file. He reports that he does not drink alcohol or use illicit drugs.  No Known  Allergies  Family History  Problem Relation Age of Onset  . Sudden death Mother   . Sudden death Father     Prior to Admission medications   Medication Sig Start Date End Date Taking? Authorizing Provider  amLODipine (NORVASC) 10 MG tablet Take 10 mg by mouth Daily.  08/15/12  Yes Historical Provider, MD  calcium carbonate (TUMS - DOSED IN MG ELEMENTAL CALCIUM) 500 MG chewable tablet Chew 1 tablet (200 mg of elemental calcium total) by mouth 3 (three) times daily with meals. 12/01/12  Yes Shanker Levora DredgeM Ghimire, MD  darbepoetin (ARANESP) 60 MCG/0.3ML SOLN injection Inject 0.3 mLs (60 mcg total) into the vein every Thursday with hemodialysis. 09/29/13  Yes Joseph ArtJessica U Vann, DO  doxercalciferol (HECTOROL) 4 MCG/2ML injection Inject 2 mLs (4 mcg total) into the vein Every Tuesday,Thursday,and Saturday with dialysis. 09/29/13  Yes Joseph ArtJessica U Vann, DO  folic acid (FOLVITE) 1 MG tablet Take 1 tablet (1 mg total) by mouth daily. 09/29/13  Yes Joseph ArtJessica U Vann, DO  lacosamide 100 MG TABS Take 1 tablet (100 mg total) by mouth 2 (two) times daily. 09/29/13  Yes Jessica U Vann, DO  pantoprazole (PROTONIX) 20 MG tablet Take 40 mg by mouth 2 (two) times daily.  10/08/12  Yes Costin Otelia SergeantM Gherghe, MD  sodium chloride 0.9 % SOLN 100 mL with ferric gluconate 12.5 MG/ML SOLN 62.5 mg Inject 62.5 mg into the vein every Thursday with hemodialysis. 09/29/13  Yes Joseph ArtJessica U Vann, DO  multivitamin (RENA-VIT) TABS tablet Take 1 tablet by mouth at bedtime. 09/29/13   Joseph ArtJessica U Vann, DO    Physical Exam:  Filed Vitals:   10/01/13 1845 10/01/13 1930 10/01/13 2000 10/01/13 2012  BP: 129/64 150/62 128/78 148/57  Pulse: 77 81 78   Temp:      TempSrc:      Resp: 13 25 14 17   SpO2: 97% 99% 99% 100%    Constitutional: Vital signs reviewed.  Elderly male lying in bed in no acute distress HEENT: No pallor, icterus, moist oral mucosa, no cervical lymphadenopathy Cardiovascular: RRR, S1 normal, S2 normal, no MRG,  Pulmonary/Chest: CTAB, no  wheezes, rales, or rhonchi Abdominal: Soft. Non-tender, non-distended, bowel sounds are normal, no masses, organomegaly, or guarding present.  GU: no CVA tenderness  extremities: Warm, no edema Right upper extremity AV fistula CNS: AAO x2 ( not oriented to time), nose intact, normal tone and power in all extremity, has fine  Hand tremors.  Labs on Admission:  Basic Metabolic Panel:  Recent Labs Lab 09/25/13 1900 09/26/13 0900 09/27/13 0539 09/29/13 0504 10/01/13 1515  NA 137 139 137 140 136*  K 3.6* 3.8 3.9 4.9 4.3  CL 93* 97 96 99 97  CO2 27 25 26 24 24   GLUCOSE 135* 94 109* 134* 167*  BUN 29* 36* 47* 31* 36*  CREATININE 4.29* 5.33* 6.80* 5.94* 6.65*  CALCIUM 8.7 8.3* 8.6 9.2 9.2  PHOS  --   --   --  4.6  --    Liver Function Tests:  Recent Labs Lab 09/25/13 1900 09/29/13 0504 10/01/13 1515  AST 13  --  13  ALT 5  --  5  ALKPHOS 40  --  42  BILITOT 0.3  --  0.4  PROT 7.4  --  6.9  ALBUMIN 3.4* 3.1* 3.0*   No results found for this basename: LIPASE, AMYLASE,  in the last 168 hours No results found for this basename: AMMONIA,  in the last 168 hours CBC:  Recent Labs Lab 09/25/13  1900 09/26/13 0900 09/27/13 0539 09/29/13 0504 10/01/13 1515  WBC 3.8* 2.9* 2.4* 3.8* 5.9  NEUTROABS  --   --   --   --  3.9  HGB 10.6* 10.0* 10.2* 10.6* 9.6*  HCT 31.4* 30.0* 30.2* 31.4* 29.0*  MCV 105.7* 105.6* 103.8* 104.3* 105.5*  PLT 82* 80* 78* 72* 82*   Cardiac Enzymes: No results found for this basename: CKTOTAL, CKMB, CKMBINDEX, TROPONINI,  in the last 168 hours BNP: No components found with this basename: POCBNP,  CBG:  Recent Labs Lab 09/28/13 1142 09/28/13 1644 09/28/13 2213 09/29/13 0635 09/29/13 1658  GLUCAP 154* 185* 127* 128* 117*    Radiological Exams on Admission: Dg Chest Portable 1 View  10/01/2013   CLINICAL DATA:  Incontinence.  Confusion.  Weakness.  EXAM: PORTABLE CHEST - 1 VIEW  COMPARISON:  09/25/2013  FINDINGS: Right paratracheal densities  attributable to ectatic vasculature. Heart size within normal limits. Low lung volumes are present, causing crowding of the pulmonary vasculature.  The lungs appear clear.  IMPRESSION: No acute thoracic findings.   Electronically Signed   By: Herbie Baltimore M.D.   On: 10/01/2013 18:25      Assessment/Plan Principal Problem:   Metabolic encephalopathy Differential includes UTI, failure to thrive with advance dementia with progressive white matter disease seen on MRI brain, ? B12 def given anemia with high MCV.( pancytopenia thought to be from depakote earlier) Admit to medicine -neurochecks and seizure precaution -We'll repeat a head CT given persistent symptoms and urinary incontinence. -Empiric Rocephin for UTI. Foley urine culture. -obtain PT. -recommend neurology consult if symptoms unimproved or concerns or seizure activity. Continue vimpat. -Check B12, folate, TSH and RPR. HIV checked recently and negative -Avoid benzos and narcotics.  Active Problems:  UTI Check urine culture. We'll place on empiric Rocephin  ESRD On hemodialysis Tu, th and sat. Will notify renal    Anemia in chronic kidney disease At baseline. Receives aranesp and ferrous gluconate with dialysis  Hypertension Continue amlodipine    Diabetes type 2, controlled Not on any medications. Monitor FSG. Lipase and sliding scale insulin    Seizures Depakote switched to vimpat recently pediatrician was seen by neurology consult. Will need to reconsult if necessary.    Other pancytopenia Monitor closely. Check B12.  DVT prophylaxis: SCDs  Diet: renal  Code Status: Full code Family Communication: Wife and son at bedside Disposition Plan: May need skilled nursing facility  Eddie [place] [facility] Pager [phone]  If 7PM-7AM, please contact night-coverage [email or web address] Password [facility] 10/01/2013, 8:42 PM   Total time spent: 55 minutes

## 2013-10-01 NOTE — ED Provider Notes (Signed)
CSN: [id number]     Arrival date & time 10/01/13  1421 History   First MD Initiated Contact with Patient 10/01/13 1501     Chief Complaint  Patient presents with  . Weakness    HPI: [name] is a 80 yo M with history of DM, OA, HTN, ESRD (TRS HD), and gout who presents with weakness, inability to ambulate and incontinence. He was admitted to our hospital for AMS six days ago, he was evaluated with MRI and EEG, symptoms were attributed to Depakote toxicity. He was felt to be at his baseline on discharge two days ago. Since returning home he has been unable to ambulate and his family has had difficulty caring for him. His daughter also states he is incontinent to urine and will not use the urinal. He also complains of left hand pain for two days. His family is concerned that he is not communicating with them as he normally does. Given their inability to care for him at home they present for evaluation.    Past Medical History  Diagnosis Date  . Hypertension   . Diabetes mellitus without complication   . Arthritis   . Renal disorder     chronic renal disease  . History of blood transfusion   . GI bleed   . History of blood transfusion    Past Surgical History  Procedure Laterality Date  . Esophagogastroduodenoscopy  10/03/2012    Procedure: ESOPHAGOGASTRODUODENOSCOPY (EGD);  Surgeon: Theda Belfast, MD;  Location: [facility] ENDOSCOPY;  Service: Endoscopy;  Laterality: N/A;  . Colonoscopy  10/04/2012    Procedure: COLONOSCOPY;  Surgeon: Theda Belfast, MD;  Location: [facility] ENDOSCOPY;  Service: Endoscopy;  Laterality: N/A;  . Colonoscopy with esophagogastroduodenoscopy (egd) Left 11/30/2012    Procedure: COLONOSCOPY WITH ESOPHAGOGASTRODUODENOSCOPY (EGD);  Surgeon: Theda Belfast, MD;  Location: [facility] ENDOSCOPY;  Service: Endoscopy;  Laterality: Left;   Family History  Problem Relation Age of Onset  . Sudden death Mother   . Sudden death Father    History  Substance Use Topics  . Smoking status:  Never Smoker   . Smokeless tobacco: Not on file  . Alcohol Use: No    Review of Systems  Constitutional: Positive for appetite change (decreased) and fatigue. Negative for fever and chills.  Eyes: Negative for photophobia and visual disturbance.  Respiratory: Negative for cough and shortness of breath.   Cardiovascular: Negative for chest pain and leg swelling.  Gastrointestinal: Negative for nausea, vomiting, abdominal pain, diarrhea and constipation.  Genitourinary: Negative for dysuria.       + incontinence   Musculoskeletal: Positive for arthralgias (left great finger MCP pain ) and joint swelling (left index MCP). Negative for back pain, gait problem and myalgias.  Skin: Negative for color change and wound.  Neurological: Negative for dizziness, syncope, light-headedness and headaches.  Psychiatric/Behavioral: Negative for confusion and agitation.  All other systems reviewed and are negative.    Allergies  Review of patient's allergies indicates no known allergies.  Home Medications   Current Outpatient Rx  Name  Route  Sig  Dispense  Refill  . amLODipine (NORVASC) 10 MG tablet   Oral   Take 10 mg by mouth Daily.          . calcium carbonate (TUMS - DOSED IN MG ELEMENTAL CALCIUM) 500 MG chewable tablet   Oral   Chew 1 tablet (200 mg of elemental calcium total) by mouth 3 (three) times daily with meals.   90 tablet  0   . darbepoetin (ARANESP) 60 MCG/0.3ML SOLN injection   Intravenous   Inject 0.3 mLs (60 mcg total) into the vein every Thursday with hemodialysis.   4.2 mL      . doxercalciferol (HECTOROL) 4 MCG/2ML injection   Intravenous   Inject 2 mLs (4 mcg total) into the vein Every Tuesday,Thursday,and Saturday with dialysis.   2 mL      . folic acid (FOLVITE) 1 MG tablet   Oral   Take 1 tablet (1 mg total) by mouth daily.   30 tablet   0   . lacosamide 100 MG TABS   Oral   Take 1 tablet (100 mg total) by mouth 2 (two) times daily.   60 tablet    0   . pantoprazole (PROTONIX) 20 MG tablet   Oral   Take 40 mg by mouth 2 (two) times daily.          . sodium chloride 0.9 % SOLN 100 mL with ferric gluconate 12.5 MG/ML SOLN 62.5 mg   Intravenous   Inject 62.5 mg into the vein every Thursday with hemodialysis.         Marland Kitchen multivitamin (RENA-VIT) TABS tablet   Oral   Take 1 tablet by mouth at bedtime.   30 tablet   0    BP 143/70  Pulse 89  Temp(Src) 98.6 F (37 C) (Rectal)  Resp 17  SpO2 99% Physical Exam  Nursing note and vitals reviewed. Constitutional: He appears ill. No distress.  Chronically ill appearing male, laying in bed, no distress. Alert to person and place.   HENT:  Head: Normocephalic and atraumatic.  Mouth/Throat: Oropharynx is clear and moist.  Eyes: Conjunctivae and EOM are normal. Pupils are equal, round, and reactive to light.  Neck: Normal range of motion. Neck supple.  Cardiovascular: Normal rate, regular rhythm, normal heart sounds and intact distal pulses.   Pulmonary/Chest: Effort normal and breath sounds normal. No respiratory distress.  Abdominal: Soft. Bowel sounds are normal. There is no tenderness. There is no rebound and no guarding.  Musculoskeletal: Normal range of motion. He exhibits no edema and no tenderness.  Left index finger MCP joint pain and swelling. Warm to touch  Neurological: He is alert. He is disoriented (to time). No cranial nerve deficit. Coordination normal. GCS eye subscore is 4. GCS verbal subscore is 4. GCS motor subscore is 5.  No focal neurologic deficits.   Skin: Skin is warm and dry. No rash noted.  Psychiatric: He has a normal mood and affect. His behavior is normal.    ED Course  Procedures (including critical care time) Labs Review Labs Reviewed  CBC WITH DIFFERENTIAL - Abnormal; Notable for the following:    RBC 2.75 (*)    Hemoglobin 9.6 (*)    HCT 29.0 (*)    MCV 105.5 (*)    MCH 34.9 (*)    Platelets 82 (*)    Monocytes Relative 18 (*)     Monocytes Absolute 1.1 (*)    All other components within normal limits  COMPREHENSIVE METABOLIC PANEL - Abnormal; Notable for the following:    Sodium 136 (*)    Glucose, Bld 167 (*)    BUN 36 (*)    Creatinine, Ser 6.65 (*)    Albumin 3.0 (*)    GFR calc non Af Amer 7 (*)    GFR calc Af Amer 8 (*)    All other components within normal limits  URINALYSIS, ROUTINE W  REFLEX MICROSCOPIC - Abnormal; Notable for the following:    pH 8.5 (*)    Glucose, UA 100 (*)    Hgb urine dipstick MODERATE (*)    Protein, ur 30 (*)    Leukocytes, UA TRACE (*)    All other components within normal limits  URINE MICROSCOPIC-ADD ON - Abnormal; Notable for the following:    Squamous Epithelial / LPF FEW (*)    Casts HYALINE CASTS (*)    All other components within normal limits  GLUCOSE, CAPILLARY - Abnormal; Notable for the following:    Glucose-Capillary 212 (*)    All other components within normal limits  GLUCOSE, CAPILLARY - Abnormal; Notable for the following:    Glucose-Capillary 139 (*)    All other components within normal limits  GLUCOSE, CAPILLARY - Abnormal; Notable for the following:    Glucose-Capillary 132 (*)    All other components within normal limits  GLUCOSE, CAPILLARY - Abnormal; Notable for the following:    Glucose-Capillary 204 (*)    All other components within normal limits  URINE CULTURE  INFLUENZA PANEL BY PCR (TYPE A & B, H1N1)  LACTIC ACID, PLASMA  OCCULT BLOOD X 1 CARD TO LAB, STOOL  VITAMIN B12  TSH  RPR  AMMONIA  FOLATE  FOLATE RBC   Imaging Review Ct Head Wo Contrast  10/01/2013   CLINICAL DATA:  Intermittent confusion and poor communication, 2 episodes of urinary incontinence.  EXAM: CT HEAD WITHOUT CONTRAST  TECHNIQUE: Contiguous axial images were obtained from the base of the skull through the vertex without intravenous contrast.  COMPARISON:  MRI of the brain September 27, 2013. CT of the head October 03, 2013  FINDINGS: The ventricles and sulci are  normal for age. No intraparenchymal hemorrhage, mass effect nor midline shift. Confluent supratentorial white matter hypodensities are within normal range for patient's age and though non-specific suggest sequelae of chronic small vessel ischemic disease. Bilateral basal ganglia mineralization. No acute large vascular territory infarcts.  No abnormal extra-axial fluid collections. Basal cisterns are patent. Moderate calcific atherosclerosis of the carotid siphons.  No skull fracture. Small bony excrescence from the inner table of the left high frontal calvarium could reflect a meningioma without mass effect. Mild maxillary mucosal thickening with small air-fluid level on the right. Mastoid air cells are well aerated. The included ocular globes and orbital contents are non-suspicious. Status post bilateral ocular lens implants.  IMPRESSION: No acute intracranial process.  Involutional changes. Severe white matter changes, similar to prior examination in though nonspecific may reflect chronic small vessel ischemic disease.  Mild apparent acute on chronic maxillary status.   Electronically Signed   By: [name]   On: 10/01/2013 22:11   Dg Chest Portable 1 View  10/01/2013   CLINICAL DATA:  Incontinence.  Confusion.  Weakness.  EXAM: PORTABLE CHEST - 1 VIEW  COMPARISON:  09/25/2013  FINDINGS: Right paratracheal densities attributable to ectatic vasculature. Heart size within normal limits. Low lung volumes are present, causing crowding of the pulmonary vasculature.  The lungs appear clear.  IMPRESSION: No acute thoracic findings.   Electronically Signed   By: Herbie BaltimoreWalt  Liebkemann M.D.   On: 10/01/2013 18:25    EKG Interpretation    Date/Time:  Monday October 01 2013 14:33:30 EST Ventricular Rate:  89 PR Interval:  169 QRS Duration: 79 QT Interval:  358 QTC Calculation: 436 R Axis:   56 Text Interpretation:  Sinus rhythm Borderline T wave abnormalities Confirmed by Bebe ShaggyWICKLINE  MD,  DONALD [phone]) on  10/01/2013 3:26:39 PM            MDM  80 yo M with history of DM, OA, HTN, ESRD (TRS HD), and gout who presents with weakness, inability to ambulate and incontinence. He is alert to person and place, not time which appears to be chronic. Given episodes of intermittent confusion, obtained head CT which was negative for acute abnormality. He has not chest pain, SOB, nausea or diaphoresis. ECG without acute ischemic changes, doubt ACS. CXR without acute abnormality. No obvious metabolic abnormality to explain his symptoms, Na 136, other lytes norma. Cr and BUN c/w ESRD. Doubt sepsis given lack of fever, lactic acid normal, UA without evidence of infection. He did have drop in Hgb from 10.5 to 9.6 since DC two days ago. Heme occult negative. We attempted to ambulate the patient but he was unable to support his own weight. Unknown cause of symptoms but worsening dementia vs delirium vs seizure vs deconditioning. Regardless he is unable to ambulate and his family cannot care for him so he was admitted to Hospitalist service. I spoke to the patient's family about his w/u and need for admission, they were in agreement with plan.   Reviewed imaging, labs, ECG and previous medical records, utilized in MDM  Discussed case with Dr. Bebe Shaggy  Clinical Impression 1. Weakness 2. Intermittent confusion 3. Deconditioning     Andrew Billet, MD 10/02/13 2009

## 2013-10-01 NOTE — ED Notes (Signed)
Pt here from home arrives via EMs with weakness since being discharged from the hospital on Saturday. Pt unable to care for self, family states pt unable to walk at home. Due for dialysis tomorrow. CBG 185. Pt currently awake, alert, at baseline. VSS.

## 2013-10-02 DIAGNOSIS — K922 Gastrointestinal hemorrhage, unspecified: Secondary | ICD-10-CM

## 2013-10-02 DIAGNOSIS — N189 Chronic kidney disease, unspecified: Secondary | ICD-10-CM

## 2013-10-02 DIAGNOSIS — D631 Anemia in chronic kidney disease: Secondary | ICD-10-CM

## 2013-10-02 DIAGNOSIS — N039 Chronic nephritic syndrome with unspecified morphologic changes: Secondary | ICD-10-CM

## 2013-10-02 LAB — RPR: RPR: NONREACTIVE

## 2013-10-02 LAB — FOLATE: Folate: 18.4 ng/mL

## 2013-10-02 LAB — GLUCOSE, CAPILLARY
Glucose-Capillary: 139 mg/dL — ABNORMAL HIGH (ref 70–99)
Glucose-Capillary: 132 mg/dL — ABNORMAL HIGH (ref 70–99)
Glucose-Capillary: 204 mg/dL — ABNORMAL HIGH (ref 70–99)
Glucose-Capillary: 215 mg/dL — ABNORMAL HIGH (ref 70–99)

## 2013-10-02 LAB — VITAMIN B12: Vitamin B-12: 884 pg/mL (ref 211–911)

## 2013-10-02 LAB — TSH: TSH: 1.527 u[IU]/mL (ref 0.350–4.500)

## 2013-10-02 NOTE — ED Provider Notes (Signed)
Medical screening examination/treatment/procedure(s) were conducted as a shared visit with non-physician practitioner(s) and myself.  I personally evaluated the patient during the encounter.  EKG Interpretation    Date/Time:  Tuesday September 25 2013 18:58:10 EST Ventricular Rate:  81 PR Interval:  140 QRS Duration: 87 QT Interval:  395 QTC Calculation: 458 R Axis:   49 Text Interpretation:  Age not entered, assumed to be  80 years old for purpose of ECG interpretation Sinus rhythm Borderline repolarization abnormality No significant change since last tracing Confirmed by Saffron Busey  MD, Darryn Kydd ([id number]) on 09/25/2013 7:04:20 PM            Results for orders placed during the hospital encounter of 09/25/13  CBC      Result Value Range   WBC 3.8 (*) 4.0 - 10.5 K/uL   RBC 2.97 (*) 4.22 - 5.81 MIL/uL   Hemoglobin 10.6 (*) 13.0 - 17.0 g/dL   HCT 85.0 (*) 27.7 - 41.2 %   MCV 105.7 (*) 78.0 - 100.0 fL   MCH 35.7 (*) 26.0 - 34.0 pg   MCHC 33.8  30.0 - 36.0 g/dL   RDW 87.8  67.6 - 72.0 %   Platelets 82 (*) 150 - 400 K/uL  COMPREHENSIVE METABOLIC PANEL      Result Value Range   Sodium 137  137 - 147 mEq/L   Potassium 3.6 (*) 3.7 - 5.3 mEq/L   Chloride 93 (*) 96 - 112 mEq/L   CO2 27  19 - 32 mEq/L   Glucose, Bld 135 (*) 70 - 99 mg/dL   BUN 29 (*) 6 - 23 mg/dL   Creatinine, Ser 9.47 (*) 0.50 - 1.35 mg/dL   Calcium 8.7  8.4 - 09.6 mg/dL   Total Protein 7.4  6.0 - 8.3 g/dL   Albumin 3.4 (*) 3.5 - 5.2 g/dL   AST 13  0 - 37 U/L   ALT 5  0 - 53 U/L   Alkaline Phosphatase 40  39 - 117 U/L   Total Bilirubin 0.3  0.3 - 1.2 mg/dL   GFR calc non Af Amer 12 (*) >90 mL/min   GFR calc Af Amer 14 (*) >90 mL/min  URINALYSIS, ROUTINE W REFLEX MICROSCOPIC      Result Value Range   Color, Urine YELLOW  YELLOW   APPearance CLEAR  CLEAR   Specific Gravity, Urine 1.013  1.005 - 1.030   pH 7.0  5.0 - 8.0   Glucose, UA NEGATIVE  NEGATIVE mg/dL   Hgb urine dipstick SMALL (*) NEGATIVE   Bilirubin  Urine NEGATIVE  NEGATIVE   Ketones, ur NEGATIVE  NEGATIVE mg/dL   Protein, ur 283 (*) NEGATIVE mg/dL   Urobilinogen, UA 0.2  0.0 - 1.0 mg/dL   Nitrite NEGATIVE  NEGATIVE   Leukocytes, UA NEGATIVE  NEGATIVE  GLUCOSE, CAPILLARY      Result Value Range   Glucose-Capillary 125 (*) 70 - 99 mg/dL  LACTIC ACID, PLASMA      Result Value Range   Lactic Acid, Venous 2.2  0.5 - 2.2 mmol/L  VALPROIC ACID LEVEL      Result Value Range   Valproic Acid Lvl 107.5 (*) 50.0 - 100.0 ug/mL  URINE MICROSCOPIC-ADD ON      Result Value Range   Squamous Epithelial / LPF RARE  RARE   WBC, UA 0-2  <3 WBC/hpf   RBC / HPF 0-2  <3 RBC/hpf   Bacteria, UA RARE  RARE   Urine-Other AMORPHOUS URATES/PHOSPHATES  CBC      Result Value Range   WBC 2.9 (*) 4.0 - 10.5 K/uL   RBC 2.84 (*) 4.22 - 5.81 MIL/uL   Hemoglobin 10.0 (*) 13.0 - 17.0 g/dL   HCT 38.2 (*) 50.5 - 39.7 %   MCV 105.6 (*) 78.0 - 100.0 fL   MCH 35.2 (*) 26.0 - 34.0 pg   MCHC 33.3  30.0 - 36.0 g/dL   RDW 67.3  41.9 - 37.9 %   Platelets 80 (*) 150 - 400 K/uL  BASIC METABOLIC PANEL      Result Value Range   Sodium 139  137 - 147 mEq/L   Potassium 3.8  3.7 - 5.3 mEq/L   Chloride 97  96 - 112 mEq/L   CO2 25  19 - 32 mEq/L   Glucose, Bld 94  70 - 99 mg/dL   BUN 36 (*) 6 - 23 mg/dL   Creatinine, Ser 0.24 (*) 0.50 - 1.35 mg/dL   Calcium 8.3 (*) 8.4 - 10.5 mg/dL   GFR calc non Af Amer 9 (*) >90 mL/min   GFR calc Af Amer 11 (*) >90 mL/min  GLUCOSE, CAPILLARY      Result Value Range   Glucose-Capillary 98  70 - 99 mg/dL  GLUCOSE, CAPILLARY      Result Value Range   Glucose-Capillary 124 (*) 70 - 99 mg/dL  GLUCOSE, CAPILLARY      Result Value Range   Glucose-Capillary 94  70 - 99 mg/dL  CBC      Result Value Range   WBC 2.4 (*) 4.0 - 10.5 K/uL   RBC 2.91 (*) 4.22 - 5.81 MIL/uL   Hemoglobin 10.2 (*) 13.0 - 17.0 g/dL   HCT 09.7 (*) 35.3 - 29.9 %   MCV 103.8 (*) 78.0 - 100.0 fL   MCH 35.1 (*) 26.0 - 34.0 pg   MCHC 33.8  30.0 - 36.0 g/dL    RDW 24.2  68.3 - 41.9 %   Platelets 78 (*) 150 - 400 K/uL  BASIC METABOLIC PANEL      Result Value Range   Sodium 137  137 - 147 mEq/L   Potassium 3.9  3.7 - 5.3 mEq/L   Chloride 96  96 - 112 mEq/L   CO2 26  19 - 32 mEq/L   Glucose, Bld 109 (*) 70 - 99 mg/dL   BUN 47 (*) 6 - 23 mg/dL   Creatinine, Ser 6.22 (*) 0.50 - 1.35 mg/dL   Calcium 8.6  8.4 - 29.7 mg/dL   GFR calc non Af Amer 7 (*) >90 mL/min   GFR calc Af Amer 8 (*) >90 mL/min  GLUCOSE, CAPILLARY      Result Value Range   Glucose-Capillary 102 (*) 70 - 99 mg/dL   Comment 1 Documented in Chart     Comment 2 Notify RN    GLUCOSE, CAPILLARY      Result Value Range   Glucose-Capillary 103 (*) 70 - 99 mg/dL  VALPROIC ACID LEVEL      Result Value Range   Valproic Acid Lvl 62.1  50.0 - 100.0 ug/mL  HEPATITIS B SURFACE ANTIGEN      Result Value Range   Hepatitis B Surface Ag NEGATIVE  NEGATIVE  HEPATITIS B SURFACE ANTIBODY      Result Value Range   Hep B S Ab NEGATIVE  NEGATIVE  VALPROIC ACID LEVEL      Result Value Range   Valproic Acid Lvl 60.5  50.0 -  100.0 ug/mL  GLUCOSE, CAPILLARY      Result Value Range   Glucose-Capillary 150 (*) 70 - 99 mg/dL  GLUCOSE, CAPILLARY      Result Value Range   Glucose-Capillary 225 (*) 70 - 99 mg/dL   Comment 1 Notify RN     Comment 2 Documented in Chart    GLUCOSE, CAPILLARY      Result Value Range   Glucose-Capillary 87  70 - 99 mg/dL   Comment 1 Notify RN     Comment 2 Documented in Chart    HIV ANTIBODY (ROUTINE TESTING)      Result Value Range   HIV NON REACTIVE  NON REACTIVE  GLUCOSE, CAPILLARY      Result Value Range   Glucose-Capillary 154 (*) 70 - 99 mg/dL  CBC      Result Value Range   WBC 3.8 (*) 4.0 - 10.5 K/uL   RBC 3.01 (*) 4.22 - 5.81 MIL/uL   Hemoglobin 10.6 (*) 13.0 - 17.0 g/dL   HCT 30.1 (*) 60.1 - 09.3 %   MCV 104.3 (*) 78.0 - 100.0 fL   MCH 35.2 (*) 26.0 - 34.0 pg   MCHC 33.8  30.0 - 36.0 g/dL   RDW 23.5  57.3 - 22.0 %   Platelets 72 (*) 150 - 400  K/uL  RENAL FUNCTION PANEL      Result Value Range   Sodium 140  137 - 147 mEq/L   Potassium 4.9  3.7 - 5.3 mEq/L   Chloride 99  96 - 112 mEq/L   CO2 24  19 - 32 mEq/L   Glucose, Bld 134 (*) 70 - 99 mg/dL   BUN 31 (*) 6 - 23 mg/dL   Creatinine, Ser 2.54 (*) 0.50 - 1.35 mg/dL   Calcium 9.2  8.4 - 27.0 mg/dL   Phosphorus 4.6  2.3 - 4.6 mg/dL   Albumin 3.1 (*) 3.5 - 5.2 g/dL   GFR calc non Af Amer 8 (*) >90 mL/min   GFR calc Af Amer 9 (*) >90 mL/min  GLUCOSE, CAPILLARY      Result Value Range   Glucose-Capillary 185 (*) 70 - 99 mg/dL   Comment 1 Documented in Chart     Comment 2 Notify RN    GLUCOSE, CAPILLARY      Result Value Range   Glucose-Capillary 127 (*) 70 - 99 mg/dL   Comment 1 Documented in Chart     Comment 2 Notify RN    GLUCOSE, CAPILLARY      Result Value Range   Glucose-Capillary 109 (*) 70 - 99 mg/dL  GLUCOSE, CAPILLARY      Result Value Range   Glucose-Capillary 128 (*) 70 - 99 mg/dL   Comment 1 Documented in Chart     Comment 2 Notify RN    GLUCOSE, CAPILLARY      Result Value Range   Glucose-Capillary 117 (*) 70 - 99 mg/dL  TYPE AND SCREEN      Result Value Range   ABO/RH(D) AB POS     Antibody Screen NEG     Sample Expiration 09/28/2013     Dg Chest 2 View  09/25/2013   CLINICAL DATA:  Altered mental status.  EXAM: CHEST  2 VIEW  COMPARISON:  01/24/2013  FINDINGS: Two views of the chest were obtained. Mild fullness in the right peritracheal region is unchanged. Heart size is normal. No evidence for airspace disease or edema. No evidence for pleural effusions.  IMPRESSION:  No acute chest findings.   Electronically Signed   By: Richarda Overlie M.D.   On: 09/25/2013 21:37   Ct Head Wo Contrast  10/01/2013   CLINICAL DATA:  Intermittent confusion and poor communication, 2 episodes of urinary incontinence.  EXAM: CT HEAD WITHOUT CONTRAST  TECHNIQUE: Contiguous axial images were obtained from the base of the skull through the vertex without intravenous contrast.   COMPARISON:  MRI of the brain September 27, 2013. CT of the head October 03, 2013  FINDINGS: The ventricles and sulci are normal for age. No intraparenchymal hemorrhage, mass effect nor midline shift. Confluent supratentorial white matter hypodensities are within normal range for patient's age and though non-specific suggest sequelae of chronic small vessel ischemic disease. Bilateral basal ganglia mineralization. No acute large vascular territory infarcts.  No abnormal extra-axial fluid collections. Basal cisterns are patent. Moderate calcific atherosclerosis of the carotid siphons.  No skull fracture. Small bony excrescence from the inner table of the left high frontal calvarium could reflect a meningioma without mass effect. Mild maxillary mucosal thickening with small air-fluid level on the right. Mastoid air cells are well aerated. The included ocular globes and orbital contents are non-suspicious. Status post bilateral ocular lens implants.  IMPRESSION: No acute intracranial process.  Involutional changes. Severe white matter changes, similar to prior examination in though nonspecific may reflect chronic small vessel ischemic disease.  Mild apparent acute on chronic maxillary status.   Electronically Signed   By: Awilda Metro   On: 10/01/2013 22:11   Ct Head Wo Contrast  09/25/2013   CLINICAL DATA:  Altered level of consciousness. Altered mental status.  EXAM: CT HEAD WITHOUT CONTRAST  TECHNIQUE: Contiguous axial images were obtained from the base of the skull through the vertex without intravenous contrast.  COMPARISON:  01/15/2013  FINDINGS: There is no evidence of intracranial hemorrhage, brain edema, or other signs of acute infarction. There is no evidence of intracranial mass lesion or mass effect. No abnormal extraaxial fluid collections are identified.  Mild cerebral atrophy is stable. Extensive chronic small vessel disease is also unchanged in appearance. Ventricles are stable in size. No  evidence of skull fracture or other significant bone abnormality.  IMPRESSION: No acute intracranial findings.  Stable chronic small vessel disease and mild cerebral atrophy.   Electronically Signed   By: Myles Rosenthal M.D.   On: 09/25/2013 20:04   Mr Brain Wo Contrast  09/27/2013   CLINICAL DATA:  Altered mental status.  EXAM: MRI HEAD WITHOUT CONTRAST  TECHNIQUE: Multiplanar, multiecho pulse sequences of the brain and surrounding structures were obtained without intravenous contrast.  COMPARISON:  Head CT 09/25/2013 and brain MRI 01/16/2013  FINDINGS: Images are mildly to moderately degraded by motion artifact.  Again seen are multiple punctate foci of susceptibility artifact within both cerebral hemispheres, greatest in the occipital lobes and posterior left temporal lobe. These are have slightly increased in number from the prior study and are compatible with remote microhemorrhages. There is no acute infarct. Confluent T2 hyperintensity within the subcortical and deep cerebral white matter bilaterally has increased significantly from the prior MRI, particularly in the frontal lobes. There is moderate cerebral atrophy, also increased from the prior MRI. There is no evidence of mass, midline shift, or extra-axial fluid collection.  Major intracranial vascular flow voids are unremarkable. Prior bilateral cataract surgery is noted. Mild bilateral ethmoid and maxillary sinus mucosal thickening is noted. Mastoid air cells are clear. Limited visualization of the upper cervical spine demonstrates moderate multilevel  spondylosis.  IMPRESSION: 1. No evidence of acute infarct. 2. Significant interval progression of white matter disease from prior MRI. While this may represent progression of chronic microvascular ischemia, other etiologies, such as metabolic and infectious encephalopathies (including HIV), are not excluded. 3. Mild interval progression of cerebral atrophy and bilateral cerebral microhemorrhages.    Electronically Signed   By: Sebastian Ache   On: 09/27/2013 19:53   Dg Chest Portable 1 View  10/01/2013   CLINICAL DATA:  Incontinence.  Confusion.  Weakness.  EXAM: PORTABLE CHEST - 1 VIEW  COMPARISON:  09/25/2013  FINDINGS: Right paratracheal densities attributable to ectatic vasculature. Heart size within normal limits. Low lung volumes are present, causing crowding of the pulmonary vasculature.  The lungs appear clear.  IMPRESSION: No acute thoracic findings.   Electronically Signed   By: Herbie Baltimore M.D.   On: 10/01/2013 18:25   Patient seen by me, arrived from home via EMS for AMS. This is new and scute. Extensive work up without specific findings to explain the AMS. Patient will open eyes to verbal stimuli but will not answer questions. Level 5 Caveat applies. Will require admission. Symptoms could be psychosomatic, focal seizure, or unusual stroke. When initially seen MR was not completed.  Shelda Jakes, MD 10/02/13 [phone]

## 2013-10-02 NOTE — Progress Notes (Signed)
UR completed. Kailah Pennel RN CCM Case Mgmt 

## 2013-10-02 NOTE — Progress Notes (Signed)
[facility]  Patient Status: Active (receiving services up to time of hospitalization)  [facility] is providing the following services: RN, PT, OT, MSW and HHA  If patient discharges after hours, please call [phone].   [name] 10/02/2013, 10:13 AM

## 2013-10-02 NOTE — Consult Note (Signed)
[facility] Renal Consultation Note  Indication for Consultation:  Management of ESRD/hemodialysis; anemia, hypertension/volume and secondary hyperparathyroidism  HPI: [name] is a 80 y.o. male with a history of seizures, diet-controlled diabetes, and ESRD on dialysis on TTS at the [facility] who was recently hospitalized 1/20 - 24 for altered mental status, believed to be secondary to Valproic Acid toxicity after negative workup for CVA, but returned last night with intermittent episodes of confusion, poor communication, and two occurrences of urinary incontinence (per Dr. Verita Lamb note).  No family members are currently present, and the patient is poorly communicative and oriented only to person.  His mental status had improved after his Valporate was discontinued during his last admission.  His urinalysis suggests possible UTI and culture is pending, but other possibilities include seizure activity or advanced dementia.  Dialysis Orders:  TTS @ [facility] 4 hrs     93.5 kg    2K/2.25Ca       450/A1.5       Heparin 3000 U     AVF @ [facility]   Hectorol 4 mcg         Epogen 9000 U         Venofer 50 mg on Thurs  Past Medical History  Diagnosis Date  . Hypertension   . Diabetes mellitus without complication   . Arthritis   . Renal disorder     chronic renal disease  . History of blood transfusion   . GI bleed   . History of blood transfusion    Past Surgical History  Procedure Laterality Date  . Esophagogastroduodenoscopy  10/03/2012    Procedure: ESOPHAGOGASTRODUODENOSCOPY (EGD);  Surgeon: Beryle Beams, MD;  Location: [facility] ENDOSCOPY;  Service: Endoscopy;  Laterality: N/A;  . Colonoscopy  10/04/2012    Procedure: COLONOSCOPY;  Surgeon: Beryle Beams, MD;  Location: [facility] ENDOSCOPY;  Service: Endoscopy;  Laterality: N/A;  . Colonoscopy with esophagogastroduodenoscopy (egd) Left 11/30/2012    Procedure: COLONOSCOPY WITH ESOPHAGOGASTRODUODENOSCOPY (EGD);  Surgeon:  Beryle Beams, MD;  Location: [facility] ENDOSCOPY;  Service: Endoscopy;  Laterality: Left;   Family History  Problem Relation Age of Onset  . Sudden death Mother   . Sudden death Father    Social History  He denies any history of tobacco, alcohol, or illicit drug use. Marland Kitchen No Known Allergies Prior to Admission medications   Medication Sig Start Date End Date Taking? Authorizing Provider  amLODipine (NORVASC) 10 MG tablet Take 10 mg by mouth Daily.  08/15/12  Yes Historical Provider, MD  calcium carbonate (TUMS - DOSED IN MG ELEMENTAL CALCIUM) 500 MG chewable tablet Chew 1 tablet (200 mg of elemental calcium total) by mouth 3 (three) times daily with meals. 12/01/12  Yes Shanker Kristeen Mans, MD  darbepoetin (ARANESP) 60 MCG/0.3ML SOLN injection Inject 0.3 mLs (60 mcg total) into the vein every Thursday with hemodialysis. 09/29/13  Yes Geradine Girt, DO  doxercalciferol (HECTOROL) 4 MCG/2ML injection Inject 2 mLs (4 mcg total) into the vein Every Tuesday,Thursday,and Saturday with dialysis. 09/29/13  Yes Geradine Girt, DO  folic acid (FOLVITE) 1 MG tablet Take 1 tablet (1 mg total) by mouth daily. 09/29/13  Yes Geradine Girt, DO  lacosamide 100 MG TABS Take 1 tablet (100 mg total) by mouth 2 (two) times daily. 09/29/13  Yes Jessica U Vann, DO  pantoprazole (PROTONIX) 20 MG tablet Take 40 mg by mouth 2 (two) times daily.  10/08/12  Yes Costin Karlyne Greenspan, MD  sodium chloride 0.9 % SOLN 100 mL with ferric gluconate 12.5 MG/ML SOLN 62.5 mg Inject 62.5 mg into the vein every Thursday with hemodialysis. 09/29/13  Yes Geradine Girt, DO  multivitamin (RENA-VIT) TABS tablet Take 1 tablet by mouth at bedtime. 09/29/13   Geradine Girt, DO   Labs:  Results for orders placed during the hospital encounter of 10/01/13 (from the past 48 hour(s))  CBC WITH DIFFERENTIAL     Status: Abnormal   Collection Time    10/01/13  3:15 PM      Result Value Range   WBC 5.9  4.0 - 10.5 K/uL   RBC 2.75 (*) 4.22 - 5.81 MIL/uL    Hemoglobin 9.6 (*) 13.0 - 17.0 g/dL   HCT 29.0 (*) 39.0 - 52.0 %   MCV 105.5 (*) 78.0 - 100.0 fL   MCH 34.9 (*) 26.0 - 34.0 pg   MCHC 33.1  30.0 - 36.0 g/dL   RDW 13.9  11.5 - 15.5 %   Platelets 82 (*) 150 - 400 K/uL   Comment: REPEATED TO VERIFY     SPECIMEN CHECKED FOR CLOTS     CONSISTENT WITH PREVIOUS RESULT   Neutrophils Relative % 66  43 - 77 %   Neutro Abs 3.9  1.7 - 7.7 K/uL   Lymphocytes Relative 15  12 - 46 %   Lymphs Abs 0.9  0.7 - 4.0 K/uL   Monocytes Relative 18 (*) 3 - 12 %   Monocytes Absolute 1.1 (*) 0.1 - 1.0 K/uL   Eosinophils Relative 1  0 - 5 %   Eosinophils Absolute 0.1  0.0 - 0.7 K/uL   Basophils Relative 0  0 - 1 %   Basophils Absolute 0.0  0.0 - 0.1 K/uL  COMPREHENSIVE METABOLIC PANEL     Status: Abnormal   Collection Time    10/01/13  3:15 PM      Result Value Range   Sodium 136 (*) 137 - 147 mEq/L   Potassium 4.3  3.7 - 5.3 mEq/L   Chloride 97  96 - 112 mEq/L   CO2 24  19 - 32 mEq/L   Glucose, Bld 167 (*) 70 - 99 mg/dL   BUN 36 (*) 6 - 23 mg/dL   Creatinine, Ser 6.65 (*) 0.50 - 1.35 mg/dL   Calcium 9.2  8.4 - 10.5 mg/dL   Total Protein 6.9  6.0 - 8.3 g/dL   Albumin 3.0 (*) 3.5 - 5.2 g/dL   AST 13  0 - 37 U/L   ALT 5  0 - 53 U/L   Alkaline Phosphatase 42  39 - 117 U/L   Total Bilirubin 0.4  0.3 - 1.2 mg/dL   GFR calc non Af Amer 7 (*) >90 mL/min   GFR calc Af Amer 8 (*) >90 mL/min   Comment: (NOTE)     The eGFR has been calculated using the CKD EPI equation.     This calculation has not been validated in all clinical situations.     eGFR's persistently <90 mL/min signify possible Chronic Kidney     Disease.  LACTIC ACID, PLASMA     Status: None   Collection Time    10/01/13  3:15 PM      Result Value Range   Lactic Acid, Venous 0.8  0.5 - 2.2 mmol/L  URINALYSIS, ROUTINE W REFLEX MICROSCOPIC     Status: Abnormal   Collection Time    10/01/13  3:39 PM  Result Value Range   Color, Urine YELLOW  YELLOW   APPearance CLEAR  CLEAR    Specific Gravity, Urine 1.015  1.005 - 1.030   pH 8.5 (*) 5.0 - 8.0   Glucose, UA 100 (*) NEGATIVE mg/dL   Hgb urine dipstick MODERATE (*) NEGATIVE   Bilirubin Urine NEGATIVE  NEGATIVE   Ketones, ur NEGATIVE  NEGATIVE mg/dL   Protein, ur 30 (*) NEGATIVE mg/dL   Urobilinogen, UA 0.2  0.0 - 1.0 mg/dL   Nitrite NEGATIVE  NEGATIVE   Leukocytes, UA TRACE (*) NEGATIVE  URINE MICROSCOPIC-ADD ON     Status: Abnormal   Collection Time    10/01/13  3:39 PM      Result Value Range   Squamous Epithelial / LPF FEW (*) RARE   WBC, UA 0-2  <3 WBC/hpf   RBC / HPF 3-6  <3 RBC/hpf   Bacteria, UA RARE  RARE   Casts HYALINE CASTS (*) NEGATIVE   Urine-Other MICROSCOPIC EXAM PERFORMED ON UNCONCENTRATED URINE     Comment: LESS THAN 10 mL OF URINE SUBMITTED  OCCULT BLOOD X 1 CARD TO LAB, STOOL     Status: None   Collection Time    10/01/13  4:52 PM      Result Value Range   Fecal Occult Bld NEGATIVE  NEGATIVE  INFLUENZA PANEL BY PCR (TYPE A & B, H1N1)     Status: None   Collection Time    10/01/13  5:05 PM      Result Value Range   Influenza A By PCR NEGATIVE  NEGATIVE   Influenza B By PCR NEGATIVE  NEGATIVE   H1N1 flu by pcr NOT DETECTED  NOT DETECTED   Comment:            The Xpert Flu assay (FDA approved for     nasal aspirates or washes and     nasopharyngeal swab specimens), is     intended as an aid in the diagnosis of     influenza and should not be used as     a sole basis for treatment.  GLUCOSE, CAPILLARY     Status: Abnormal   Collection Time    10/01/13 10:18 PM      Result Value Range   Glucose-Capillary 212 (*) 70 - 99 mg/dL  VITAMIN B12     Status: None   Collection Time    10/01/13 10:56 PM      Result Value Range   Vitamin B-12 884  211 - 911 pg/mL   Comment: Performed at [facility]  TSH     Status: None   Collection Time    10/01/13 10:56 PM      Result Value Range   TSH 1.527  0.350 - 4.500 uIU/mL   Comment: Performed at [facility]  RPR      Status: None   Collection Time    10/01/13 10:56 PM      Result Value Range   RPR NON REACTIVE  NON REACTIVE   Comment: Performed at [facility]     Status: None   Collection Time    10/01/13 10:56 PM      Result Value Range   Ammonia 20  11 - 60 umol/L  GLUCOSE, CAPILLARY     Status: Abnormal   Collection Time    10/02/13  7:32 AM      Result Value Range   Glucose-Capillary 139 (*) 70 -  99 mg/dL   Comment 1 Documented in Chart    FOLATE     Status: None   Collection Time    10/02/13  8:10 AM      Result Value Range   Folate 18.4     Comment: (NOTE)     Reference Ranges            Deficient:       0.4 - 3.3 ng/mL            Indeterminate:   3.4 - 5.4 ng/mL            Normal:              > 5.4 ng/mL     Performed at [facility], CAPILLARY     Status: Abnormal   Collection Time    10/02/13 11:13 AM      Result Value Range   Glucose-Capillary 132 (*) 70 - 99 mg/dL   Comment 1 Documented in Chart     Review of systems not obtained due to patient factors.  Physical Exam: Filed Vitals:   10/02/13 1300  BP: 152/68  Pulse: 83  Temp: 98.1 F (36.7 C)  Resp: 20     General appearance: alert, cooperative and no distress Head: Normocephalic, without obvious abnormality, atraumatic Neck: no adenopathy, no carotid bruit, no JVD and supple, symmetrical, trachea midline Resp: clear to auscultation bilaterally Cardio: RRR with Gr II/VI systolic murmur, no rub GI: soft, non-tender; bowel sounds normal; no masses,  no organomegaly Extremities: extremities normal, atraumatic, no cyanosis or edema Neurologic: Oriented to person only, no focal deficits Dialysis Access: AVF @ [facility] with + bruit   Assessment/Plan: 1. AMS - @ [facility] 1/20 - 24 when valproic acid was dc'd sec to toxicity; ? UTI per UA, culture pending, on Rocephin; possible advanced dementia with progressive white matter disease per MRI 1/22, CT today with no acute process. 2. Seizure  disorder - now on Vimpat. 3. ESRD - HD on TTS @ [place]; K 4.3, last HD in hospital 1/24.  HD pending today. 4. Hypertension/volume - BP 152/68 on Amlodipine 10 mg qd; currently below EDW with negative chest x-ray. 5. Anemia - Hgb 9.6 on outpatient Epogen & weekly Fe.  Aranesp 100 mcg today. 6. Metabolic bone disease - Ca 9.2 (10 corrected), P 4.6; Hectorol 4 mcg, no binders. 7. Nutrition - Alb 3; renal diet & vitamin. 8. DM Type 2 - diet-controlled.  LYLES,CHARLES 10/02/2013, 2:00 PM   Attending Nephrologist: Roney Jaffe, MD  I have seen and examined patient, discussed with PA and agree with assessment and plan as outlined above. Kelly Splinter MD pager [phone]    cell [phone] 10/02/2013, 5:25 PM

## 2013-10-02 NOTE — Progress Notes (Signed)
[facility] PROGRESS NOTE   [name] SWN:[id number] DOB: [birth year] DOA: 10/01/2013 PCP: Cecille Aver, MD  HPI/Subjective: Patient is awake but disoriented.  Assessment/Plan: Principal Problem:   Metabolic encephalopathy Active Problems:   Anemia in chronic kidney disease   Diabetes type 2, controlled   Seizures   Altered mental state   ESRD on dialysis   Urinary incontinence   Other pancytopenia   Failure to thrive in adult   Acute metabolic encephalopathy -Unclear etiology, patient has baseline of dementia. -Normal folate and B12, normal ammonia level. -Urinalysis showed hyaline casts but no multiple pus cells. -Patient follows simple commands so doubt this is seizure. -We will avoid benzodiazepines and narcotics. -No fever or chills, and if patient develops fever we'll obtain blood cultures.  ESRD -Patient is on hemodialysis Tuesday, Thursday and Saturday. Renal notified.  Anemia -Likely secondary to chronic kidney disease. Patient is on Aranesp and ferrous gluconate.  HTN -Continue home medications.  Diabetes mellitus type 2 -Not on any medication.  Disposition -Patient likely will need skilled nursing facility, per previous note family declined. -Per RN patient lives with wife who was probably also has memory problems.  Code Status: Full code Family Communication: Plan discussed with the patient. Disposition Plan: Remains inpatient   Consultants:  Nephrology  Procedures:  None  Antibiotics:  Rocephin   Objective: Filed Vitals:   10/02/13 1300  BP: 152/68  Pulse: 83  Temp: 98.1 F (36.7 C)  Resp: 20    Intake/Output Summary (Last 24 hours) at 10/02/13 1456 Last data filed at 10/02/13 1321  Gross per 24 hour  Intake    120 ml  Output    700 ml  Net   -580 ml   Filed Weights   10/01/13 2050  Weight: 93.2 kg (205 lb 7.5 oz)    Exam: General: Alert and awake, oriented x3, not in any acute distress. HEENT:  anicteric sclera, pupils reactive to light and accommodation, EOMI CVS: S1-S2 clear, no murmur rubs or gallops Chest: clear to auscultation bilaterally, no wheezing, rales or rhonchi Abdomen: soft nontender, nondistended, normal bowel sounds, no organomegaly Extremities: no cyanosis, clubbing or edema noted bilaterally Neuro: Cranial nerves II-XII intact, no focal neurological deficits  Data Reviewed: Basic Metabolic Panel:  Recent Labs Lab 09/25/13 1900 09/26/13 0900 09/27/13 0539 09/29/13 0504 10/01/13 1515  NA 137 139 137 140 136*  K 3.6* 3.8 3.9 4.9 4.3  CL 93* 97 96 99 97  CO2 27 25 26 24 24   GLUCOSE 135* 94 109* 134* 167*  BUN 29* 36* 47* 31* 36*  CREATININE 4.29* 5.33* 6.80* 5.94* 6.65*  CALCIUM 8.7 8.3* 8.6 9.2 9.2  PHOS  --   --   --  4.6  --    Liver Function Tests:  Recent Labs Lab 09/25/13 1900 09/29/13 0504 10/01/13 1515  AST 13  --  13  ALT 5  --  5  ALKPHOS 40  --  42  BILITOT 0.3  --  0.4  PROT 7.4  --  6.9  ALBUMIN 3.4* 3.1* 3.0*   No results found for this basename: LIPASE, AMYLASE,  in the last 168 hours  Recent Labs Lab 10/01/13 2256  AMMONIA 20   CBC:  Recent Labs Lab 09/25/13 1900 09/26/13 0900 09/27/13 0539 09/29/13 0504 10/01/13 1515  WBC 3.8* 2.9* 2.4* 3.8* 5.9  NEUTROABS  --   --   --   --  3.9  HGB 10.6* 10.0* 10.2* 10.6* 9.6*  HCT 31.4*  30.0* 30.2* 31.4* 29.0*  MCV 105.7* 105.6* 103.8* 104.3* 105.5*  PLT 82* 80* 78* 72* 82*   Cardiac Enzymes: No results found for this basename: CKTOTAL, CKMB, CKMBINDEX, TROPONINI,  in the last 168 hours BNP (last 3 results) No results found for this basename: PROBNP,  in the last 8760 hours CBG:  Recent Labs Lab 09/29/13 0635 09/29/13 1658 10/01/13 2218 10/02/13 0732 10/02/13 1113  GLUCAP 128* 117* 212* 139* 132*    Micro No results found for this or any previous visit (from the past 240 hour(s)).   Studies: Ct Head Wo Contrast  10/01/2013   CLINICAL DATA:  Intermittent  confusion and poor communication, 2 episodes of urinary incontinence.  EXAM: CT HEAD WITHOUT CONTRAST  TECHNIQUE: Contiguous axial images were obtained from the base of the skull through the vertex without intravenous contrast.  COMPARISON:  MRI of the brain September 27, 2013. CT of the head October 03, 2013  FINDINGS: The ventricles and sulci are normal for age. No intraparenchymal hemorrhage, mass effect nor midline shift. Confluent supratentorial white matter hypodensities are within normal range for patient's age and though non-specific suggest sequelae of chronic small vessel ischemic disease. Bilateral basal ganglia mineralization. No acute large vascular territory infarcts.  No abnormal extra-axial fluid collections. Basal cisterns are patent. Moderate calcific atherosclerosis of the carotid siphons.  No skull fracture. Small bony excrescence from the inner table of the left high frontal calvarium could reflect a meningioma without mass effect. Mild maxillary mucosal thickening with small air-fluid level on the right. Mastoid air cells are well aerated. The included ocular globes and orbital contents are non-suspicious. Status post bilateral ocular lens implants.  IMPRESSION: No acute intracranial process.  Involutional changes. Severe white matter changes, similar to prior examination in though nonspecific may reflect chronic small vessel ischemic disease.  Mild apparent acute on chronic maxillary status.   Electronically Signed   By: Awilda Metro   On: 10/01/2013 22:11   Dg Chest Portable 1 View  10/01/2013   CLINICAL DATA:  Incontinence.  Confusion.  Weakness.  EXAM: PORTABLE CHEST - 1 VIEW  COMPARISON:  09/25/2013  FINDINGS: Right paratracheal densities attributable to ectatic vasculature. Heart size within normal limits. Low lung volumes are present, causing crowding of the pulmonary vasculature.  The lungs appear clear.  IMPRESSION: No acute thoracic findings.   Electronically Signed   By: Herbie Baltimore M.D.   On: 10/01/2013 18:25    Scheduled Meds: . amLODipine  10 mg Oral Daily  . calcium carbonate  1 tablet Oral TID WC  . cefTRIAXone (ROCEPHIN)  IV  1 g Intravenous Q24H  . [START ON 10/04/2013] darbepoetin  60 mcg Intravenous Q Thu-HD  . doxercalciferol  4 mcg Intravenous Q T,Th,Sa-HD  . [START ON 10/04/2013] ferric gluconate (FERRLECIT/NULECIT) IV  62.5 mg Intravenous Q Thu-HD  . folic acid  1 mg Oral Daily  . insulin aspart  0-9 Units Subcutaneous TID WC  . lacosamide  100 mg Oral BID  . multivitamin  1 tablet Oral QHS  . pantoprazole  40 mg Oral BID   Continuous Infusions:      Time spent: 35 minutes    [facility]  [facility] Pager [phone] If 7PM-7AM, please contact night-coverage at [email or web address], password [facility] 10/02/2013, 2:56 PM  LOS: 1 day

## 2013-10-02 NOTE — Care Management Note (Signed)
    Page 1 of 1   10/09/2013     11:57:41 AM   CARE MANAGEMENT NOTE 10/09/2013  Patient:  [facility]   Account Number:  [id number]  Date Initiated:  10/02/2013  Documentation initiated by:  Letha Cape  Subjective/Objective Assessment:   dx metabolic encephalopathy  admit- from home with wife.     Action/Plan:   pt/ot eval- rec snf   Anticipated DC Date:  10/09/2013   Anticipated DC Plan:  SKILLED NURSING FACILITY  In-house referral  Clinical Social Worker      DC Planning Services  CM consult      Choice offered to / List presented to:             Status of service:  Completed, signed off Medicare Important Message given?   (If response is "NO", the following Medicare IM given date fields will be blank) Date Medicare IM given:   Date Additional Medicare IM given:    Discharge Disposition:  SKILLED NURSING FACILITY  Per UR Regulation:  Reviewed for med. necessity/level of care/duration of stay  If discussed at Long Length of Stay Meetings, dates discussed:    Comments:  10/08/13 11:56 Letha Cape RN, BSN [phone] patient is for dc to snf today, CSW following.  10/05/13 14:24 Letha Cape RN, BSN [phone] patient with AMS, suspect metabolic encephalopathy, cause unknown, there is no source of infection, last admit here from 1/20-1/24 with Depakote toxicity, mri and ct was negative. Patient is ESRD, with chronic anemia, metabolic bone dz and DM type 2.  temp 100.1 last pmt , iv abx  10/02/13 16:08 Letha Cape RN, BSN [phone] physical therapy recs snf, CSW referral.

## 2013-10-02 NOTE — Evaluation (Signed)
Physical Therapy Evaluation Patient Details Name: [name] MRN: [id number] DOB: 07-15-1932 Today's Date: 10/02/2013 Time: 4098-1191 PT Time Calculation (min): 26 min  PT Assessment / Plan / Recommendation History of Present Illness  80 year old male with history of end-stage renal disease on dialysis (tuesdays, Thursdays and Saturdays), seizure disorder, diet controlled diabetes, anemia of chronic kidney disease who was recently admitted and discharged 2 days back after being evaluated for altered mental status. Family had noticed significant changes in his personality. He was admitted to the hospital and worked up for altered mental status with negative workup for CVA and seizures. His valproate was discontinued as patient was developing pancytopenia. Neurology was consulted at that time. Patient's mental status had started to improve (although was not at baseline). Patient was seen by PT OT and was discharged home (family had declined for skilled nursing facility).  Clinical Impression  Pt admitted with metabolic encephalopathy. Pt currently with functional limitations due to the deficits listed below (see PT Problem List).  Pt will benefit from skilled PT to increase their independence and safety with mobility to allow discharge to the venue listed below.  Pt's cognition limited clear picture of PLOF and available assist at d/c.  At this time, pt. Would need mod/max A for all mobility from caregivers to d/c home. Recommend SNF at this time.     PT Assessment  Patient needs continued PT services    Follow Up Recommendations  SNF;Supervision/Assistance - 24 hour    Does the patient have the potential to tolerate intense rehabilitation      Barriers to Discharge Decreased caregiver support unsure of baseline level of function; feel family may not be able to provide mod/max A for mobility at d/c.    Equipment Recommendations  None recommended by PT    Recommendations for Other Services      Frequency Min 3X/week    Precautions / Restrictions Precautions Precautions: Fall Restrictions Weight Bearing Restrictions: No   Pertinent Vitals/Pain None reported      Mobility  Bed Mobility Overal bed mobility: Needs Assistance Bed Mobility: Supine to Sit Supine to sit: Max assist;HOB elevated General bed mobility comments: increased time and max cues and assistance to perform Transfers Overall transfer level: Needs assistance Equipment used: Rolling walker (2 wheeled) Transfers: Sit to/from [employer] Sit to Stand: Max assist;From elevated surface Stand pivot transfers: Max assist;From elevated surface General transfer comment: assist to block feet from sliding forward; significantly forward flexed posture; pt. did maintain correct UE placement on RW and reaching for armrest of chair with one UE and other on RW.  PT had to stabilize RW for safety. Uncontrolled descent to sit and max cues to complete transfer and not sit prematurely.    Exercises     PT Diagnosis: Difficulty walking;Abnormality of gait;Generalized weakness;Altered mental status  PT Problem List: Decreased strength;Decreased range of motion;Decreased activity tolerance;Decreased balance;Decreased mobility;Decreased coordination;Decreased cognition PT Treatment Interventions: DME instruction;Gait training;Stair training;Functional mobility training;Therapeutic activities;Therapeutic exercise;Balance training;Cognitive remediation;Patient/family education     PT Goals(Current goals can be found in the care plan section) Acute Rehab PT Goals Patient Stated Goal: unable to state PT Goal Formulation: Patient unable to participate in goal setting Time For Goal Achievement: 10/09/13 Potential to Achieve Goals: Fair  Visit Information  Last PT Received On: 10/02/13 Assistance Needed: +2 History of Present Illness: 80 year old male with history of end-stage renal disease on dialysis (tuesdays,  Thursdays and Saturdays), seizure disorder, diet controlled diabetes, anemia of chronic kidney disease  who was recently admitted and discharged 2 days back after being evaluated for altered mental status. Family had noticed significant changes in his personality. He was admitted to the hospital and worked up for altered mental status with negative workup for CVA and seizures. His valproate was discontinued as patient was developing pancytopenia. Neurology was consulted at that time. Patient's mental status had started to improve (although was not at baseline). Patient was seen by PT OT and was discharged home (family had declined for skilled nursing facility).       Prior Functioning  Home Living Family/patient expects to be discharged to:: Private residence Living Arrangements: Spouse/significant other Available Help at Discharge: Family;Available 24 hours/day Type of Home: House Home Access: Stairs to enter [employer] of Steps: 3 Entrance Stairs-Rails: Can reach both Home Layout: Two level;Able to live on main level with bedroom/bathroom Alternate Level Stairs-Number of Steps: stair lift to second story Home Equipment: Walker - 2 wheels;Crutches;Cane - single point Additional Comments: question accuracy of PLOF from pt.  Multiple times answered "yes" inappropriately or gave various response.  "yes" stated for one and two level home question. Prior Function Level of Independence: Independent with assistive device(s) Comments: has 24/7 caregivers (family) Communication Communication: Expressive difficulties (slow to respond, responds "yes" inappropriately at times) Dominant Hand: Right    Cognition  Cognition Arousal/Alertness: Awake/alert Behavior During Therapy: Flat affect Overall Cognitive Status: No family/caregiver present to determine baseline cognitive functioning Memory: Decreased short-term memory    Extremity/Trunk Assessment Upper Extremity Assessment Upper  Extremity Assessment: Generalized weakness Lower Extremity Assessment Lower Extremity Assessment: Generalized weakness Cervical / Trunk Assessment Cervical / Trunk Assessment: Kyphotic   Balance Balance Overall balance assessment: Needs assistance Sitting-balance support: Feet supported;Bilateral upper extremity supported Sitting balance-Leahy Scale: Fair Standing balance support: Bilateral upper extremity supported;During functional activity Standing balance-Leahy Scale: Poor  End of Session PT - End of Session Equipment Utilized During Treatment: Gait belt Activity Tolerance: Patient tolerated treatment well Patient left: in chair;with call bell/phone within reach Nurse Communication: Mobility status  GP     Moshe Cipro K 10/02/2013, 10:54 AM  Clarita Crane, PT, DPT [phone]

## 2013-10-03 ENCOUNTER — Encounter (HOSPITAL_COMMUNITY): Payer: Self-pay | Admitting: Physician Assistant

## 2013-10-03 DIAGNOSIS — R569 Unspecified convulsions: Secondary | ICD-10-CM

## 2013-10-03 DIAGNOSIS — N186 End stage renal disease: Secondary | ICD-10-CM | POA: Insufficient documentation

## 2013-10-03 DIAGNOSIS — I10 Essential (primary) hypertension: Secondary | ICD-10-CM

## 2013-10-03 LAB — CBC
HEMATOCRIT: 23.9 % — AB (ref 39.0–52.0)
HEMOGLOBIN: 8.1 g/dL — AB (ref 13.0–17.0)
MCH: 34 pg (ref 26.0–34.0)
MCHC: 33.9 g/dL (ref 30.0–36.0)
MCV: 100.4 fL — ABNORMAL HIGH (ref 78.0–100.0)
Platelets: 107 10*3/uL — ABNORMAL LOW (ref 150–400)
RBC: 2.38 MIL/uL — AB (ref 4.22–5.81)
RDW: 13 % (ref 11.5–15.5)
WBC: 4.9 10*3/uL (ref 4.0–10.5)

## 2013-10-03 LAB — RENAL FUNCTION PANEL
Albumin: 2.6 g/dL — ABNORMAL LOW (ref 3.5–5.2)
BUN: 55 mg/dL — AB (ref 6–23)
CO2: 22 mEq/L (ref 19–32)
Calcium: 8.7 mg/dL (ref 8.4–10.5)
Chloride: 98 mEq/L (ref 96–112)
Creatinine, Ser: 8.43 mg/dL — ABNORMAL HIGH (ref 0.50–1.35)
GFR calc Af Amer: 6 mL/min — ABNORMAL LOW (ref >90)
GFR calc non Af Amer: 5 mL/min — ABNORMAL LOW (ref >90)
GLUCOSE: 132 mg/dL — AB (ref 70–99)
POTASSIUM: 4.8 meq/L (ref 3.7–5.3)
Phosphorus: 5.1 mg/dL — ABNORMAL HIGH (ref 2.3–4.6)
Sodium: 136 mEq/L — ABNORMAL LOW (ref 137–147)

## 2013-10-03 LAB — GLUCOSE, CAPILLARY
GLUCOSE-CAPILLARY: 195 mg/dL — AB (ref 70–99)
Glucose-Capillary: 122 mg/dL — ABNORMAL HIGH (ref 70–99)
Glucose-Capillary: 226 mg/dL — ABNORMAL HIGH (ref 70–99)

## 2013-10-03 LAB — FOLATE RBC: RBC Folate: 732 ng/mL — ABNORMAL HIGH (ref >280)

## 2013-10-03 MED ORDER — HEPARIN SODIUM (PORCINE) 1000 UNIT/ML DIALYSIS: 1000.0000 [IU] | INTRAMUSCULAR | Status: DC | PRN

## 2013-10-03 MED ORDER — SODIUM CHLORIDE 0.9 % IV SOLN: 100.0000 mL | INTRAVENOUS | Status: DC | PRN

## 2013-10-03 MED ORDER — PENTAFLUOROPROP-TETRAFLUOROETH EX AERO: 1.0000 "application " | INHALATION_SPRAY | CUTANEOUS | Status: DC | PRN

## 2013-10-03 MED ORDER — HEPARIN SODIUM (PORCINE) 1000 UNIT/ML DIALYSIS
3000.0000 [IU] | Freq: Once | INTRAMUSCULAR | Status: AC
  Administered 2013-10-03: 3000 [IU] via INTRAVENOUS_CENTRAL

## 2013-10-03 MED ORDER — HEPARIN SODIUM (PORCINE) 1000 UNIT/ML DIALYSIS
1000.0000 [IU] | INTRAMUSCULAR | Status: DC | PRN
Start: 2013-10-03 — End: 2013-10-03

## 2013-10-03 MED ORDER — NEPRO/CARBSTEADY PO LIQD
237.0000 mL | ORAL | Status: DC | PRN
  Filled 2013-10-03: qty 237

## 2013-10-03 MED ORDER — LIDOCAINE-PRILOCAINE 2.5-2.5 % EX CREA
1.0000 | TOPICAL_CREAM | CUTANEOUS | Status: DC | PRN
Start: 2013-10-03 — End: 2013-10-03
  Filled 2013-10-03: qty 5

## 2013-10-03 MED ORDER — ALTEPLASE 2 MG IJ SOLR
2.0000 mg | Freq: Once | INTRAMUSCULAR | Status: DC | PRN
  Filled 2013-10-03: qty 2

## 2013-10-03 MED ORDER — HEPARIN SODIUM (PORCINE) 1000 UNIT/ML DIALYSIS: 3000.0000 [IU] | Freq: Once | INTRAMUSCULAR | Status: DC

## 2013-10-03 MED ORDER — LIDOCAINE-PRILOCAINE 2.5-2.5 % EX CREA
1.0000 "application " | TOPICAL_CREAM | CUTANEOUS | Status: DC | PRN
  Filled 2013-10-03: qty 5

## 2013-10-03 MED ORDER — LIDOCAINE HCL (PF) 1 % IJ SOLN: 5.0000 mL | INTRAMUSCULAR | Status: DC | PRN

## 2013-10-03 NOTE — Clinical Social Work Placement (Signed)
Clinical Social Work Department CLINICAL SOCIAL WORK PLACEMENT NOTE 10/03/2013  Patient:  [name]  Account Number:  [id number] Admit date:  10/01/2013  Clinical Social Worker:  Cherre Blanc, [place]  Date/time:  10/03/2013 03:30 PM  Clinical Social Work is seeking post-discharge placement for this patient at the following level of care:   SKILLED NURSING   (*CSW will update this form in Epic as items are completed)   10/03/2013  Patient/family provided with [facility] Department of Clinical Social Work's list of facilities offering this level of care within the geographic area requested by the patient (or if unable, by the patient's family).  10/03/2013  Patient/family informed of their freedom to choose among providers that offer the needed level of care, that participate in Medicare, Medicaid or managed care program needed by the patient, have an available bed and are willing to accept the patient.  10/03/2013  Patient/family informed of [facility]' ownership interest in [facility], as well as of the fact that they are under no obligation to receive care at this facility.  PASARR submitted to EDS on 10/03/2013 PASARR number received from EDS on 10/03/2013  FL2 transmitted to all facilities in geographic area requested by pt/family on  10/03/2013 FL2 transmitted to all facilities within larger geographic area on   Patient informed that his/her managed care company has contracts with or will negotiate with  certain facilities, including the following:     Patient/family informed of bed offers received:   Patient chooses bed at  Physician recommends and patient chooses bed at    Patient to be transferred to  on   Patient to be transferred to facility by   The following physician request were entered in Epic:   Additional Comments:   [name], [place], [place], [phone]

## 2013-10-03 NOTE — Procedures (Signed)
I was present at this dialysis session, have reviewed the session itself and made  appropriate changes  [name] (pgr) [phone]    [phone] 10/03/2013, 11:54 AM

## 2013-10-03 NOTE — Consult Note (Signed)
NEURO HOSPITALIST CONSULT NOTE    Reason for Consult: altered mental status and inability to walk.  HPI:                                                                                                                                          [name] is an 80 y.o. male with PMH: HTN, DM, ESRD on dialysis, GI bleed, seizures, brought in due to mental state changes and inability to walk and perform his ADLs. Patient is not able to provide clinical information due to mental status and family is not available at this moment, thus all history is obtained form the patient's chart. He was admitted to [facility] from 1/20-1/24 due to altered mental status and at that time was evaluated by neurology due to altered mental status thought to be related to a toxic metabolic encephalopathy. His valproic acid level was 107.5. It was discontinued and the patient was started on Vimpat. He improve in the hospital but did not get back to baseline. He was discharged to home. At home his mental status rapidly declined and couldn't engage in normal conversations or perform his ADLs. Readmitted on 1/26 due to increased lethargy and inability to ambulate. Last MRI brain performed 1/21 revealed " significant interval progression of white matter disease from prior MRI but no acute infarct". Continuous EEG  1/23 was negative for any intermittent seizures. CT brain 1/26 showed no acute abnormality.    Past Medical History  Diagnosis Date  . Hypertension   . Diabetes mellitus without complication   . Arthritis   . Renal disorder     chronic renal disease  . History of blood transfusion   . GI bleed   . History of blood transfusion   . ESRD (end stage renal disease)   . Seizure     Past Surgical History  Procedure Laterality Date  . Esophagogastroduodenoscopy  10/03/2012    Procedure: ESOPHAGOGASTRODUODENOSCOPY (EGD);  Surgeon: Beryle Beams, MD;  Location: [facility] ENDOSCOPY;  Service: Endoscopy;   Laterality: N/A;  . Colonoscopy  10/04/2012    Procedure: COLONOSCOPY;  Surgeon: Beryle Beams, MD;  Location: [facility] ENDOSCOPY;  Service: Endoscopy;  Laterality: N/A;  . Colonoscopy with esophagogastroduodenoscopy (egd) Left 11/30/2012    Procedure: COLONOSCOPY WITH ESOPHAGOGASTRODUODENOSCOPY (EGD);  Surgeon: Beryle Beams, MD;  Location: [facility] ENDOSCOPY;  Service: Endoscopy;  Laterality: Left;    Family History  Problem Relation Age of Onset  . Sudden death Mother   . Sudden death Father     Social History:  reports that he has never smoked. He does not have any smokeless tobacco history on file. He reports that he does not drink alcohol or use illicit drugs.  No Known Allergies  MEDICATIONS:  I have reviewed the patient's current medications.   ROS: unable to obtain due to mental status.                                                                                                                                      History obtained from chart review  Physical exam: pleasant male in no apparent distress. Blood pressure 128/64, pulse 96, temperature 98.3 F (36.8 C), temperature source Oral, resp. rate 20, height 6' 1"  (1.854 m), weight 93.2 kg (205 lb 7.5 oz), SpO2 98.00%. Head: normocephalic. Neck: supple, no bruits, no JVD. Cardiac: no murmurs. Lungs: clear. Abdomen: soft, no tender, no mass. Extremities: bilateral LE edema.  Neurologic Examination:                                                                                                      Mental Status: Alert and awake  And follows simple commands. Speech fluent without evidence of aphasia.  Cranial Nerves: II: Discs flat bilaterally; Visual fields grossly normal, pupils equal, round, reactive to light III,IV, VI: ptosis not present, extra-ocular motions intact bilaterally V,VII: smile symmetric, facial  light touch sensation normal bilaterally VIII: hearing normal bilaterally IX,X: gag reflex present XI: bilateral shoulder shrug XII: midline tongue extension without atrophy or fasciculations  Motor: Moves all limbs symmetrically. Tone and bulk:normal tone throughout; no atrophy noted Sensory: no tested Deep Tendon Reflexes:  1+ all over Plantars: Right: downgoing   Left: downgoing Cerebellar: No tested. Gait: Unable to test. CV: pulses palpable throughout    No results found for this basename: cbc, bmp, coags, chol, tri, ldl, hga1c    Results for orders placed during the hospital encounter of 10/01/13 (from the past 48 hour(s))  GLUCOSE, CAPILLARY     Status: Abnormal   Collection Time    10/01/13 10:18 PM      Result Value Range   Glucose-Capillary 212 (*) 70 - 99 mg/dL  VITAMIN B12     Status: None   Collection Time    10/01/13 10:56 PM      Result Value Range   Vitamin B-12 884  211 - 911 pg/mL   Comment: Performed at [facility]  TSH     Status: None   Collection Time    10/01/13 10:56 PM      Result Value Range   TSH 1.527  0.350 - 4.500 uIU/mL   Comment: Performed at [facility]  RPR     Status:  None   Collection Time    10/01/13 10:56 PM      Result Value Range   RPR NON REACTIVE  NON REACTIVE   Comment: Performed at [facility] RBC     Status: Abnormal   Collection Time    10/01/13 10:56 PM      Result Value Range   RBC Folate 732 (*) >280 ng/mL   Comment: (NOTE)     We are unable to perform the hematocrit due to specimen issues (QNS,     stability, frozen sample).  A default hematocrit value of 33% has been     used to calculate the RBC Folate.     Reference range not established for pediatric patients.     Performed at [facility]     Status: None   Collection Time    10/01/13 10:56 PM      Result Value Range   Ammonia 20  11 - 60 umol/L  GLUCOSE, CAPILLARY     Status: Abnormal    Collection Time    10/02/13  7:32 AM      Result Value Range   Glucose-Capillary 139 (*) 70 - 99 mg/dL   Comment 1 Documented in Chart    FOLATE     Status: None   Collection Time    10/02/13  8:10 AM      Result Value Range   Folate 18.4     Comment: (NOTE)     Reference Ranges            Deficient:       0.4 - 3.3 ng/mL            Indeterminate:   3.4 - 5.4 ng/mL            Normal:              > 5.4 ng/mL     Performed at [facility], CAPILLARY     Status: Abnormal   Collection Time    10/02/13 11:13 AM      Result Value Range   Glucose-Capillary 132 (*) 70 - 99 mg/dL   Comment 1 Documented in Chart    GLUCOSE, CAPILLARY     Status: Abnormal   Collection Time    10/02/13  5:52 PM      Result Value Range   Glucose-Capillary 204 (*) 70 - 99 mg/dL  GLUCOSE, CAPILLARY     Status: Abnormal   Collection Time    10/02/13 10:09 PM      Result Value Range   Glucose-Capillary 215 (*) 70 - 99 mg/dL   Comment 1 Documented in Chart     Comment 2 Notify RN    CBC     Status: Abnormal   Collection Time    10/03/13  7:09 AM      Result Value Range   WBC 4.9  4.0 - 10.5 K/uL   RBC 2.38 (*) 4.22 - 5.81 MIL/uL   Hemoglobin 8.1 (*) 13.0 - 17.0 g/dL   HCT 23.9 (*) 39.0 - 52.0 %   MCV 100.4 (*) 78.0 - 100.0 fL   MCH 34.0  26.0 - 34.0 pg   MCHC 33.9  30.0 - 36.0 g/dL   RDW 13.0  11.5 - 15.5 %   Platelets 107 (*) 150 - 400 K/uL   Comment: CONSISTENT WITH PREVIOUS RESULT  RENAL FUNCTION PANEL     Status: Abnormal  Collection Time    10/03/13  7:09 AM      Result Value Range   Sodium 136 (*) 137 - 147 mEq/L   Potassium 4.8  3.7 - 5.3 mEq/L   Chloride 98  96 - 112 mEq/L   CO2 22  19 - 32 mEq/L   Glucose, Bld 132 (*) 70 - 99 mg/dL   BUN 55 (*) 6 - 23 mg/dL   Creatinine, Ser 8.43 (*) 0.50 - 1.35 mg/dL   Calcium 8.7  8.4 - 10.5 mg/dL   Phosphorus 5.1 (*) 2.3 - 4.6 mg/dL   Albumin 2.6 (*) 3.5 - 5.2 g/dL   GFR calc non Af Amer 5 (*) >90 mL/min   GFR calc Af Amer 6  (*) >90 mL/min   Comment: (NOTE)     The eGFR has been calculated using the CKD EPI equation.     This calculation has not been validated in all clinical situations.     eGFR's persistently <90 mL/min signify possible Chronic Kidney     Disease.  GLUCOSE, CAPILLARY     Status: Abnormal   Collection Time    10/03/13 12:27 PM      Result Value Range   Glucose-Capillary 122 (*) 70 - 99 mg/dL  GLUCOSE, CAPILLARY     Status: Abnormal   Collection Time    10/03/13  5:10 PM      Result Value Range   Glucose-Capillary 195 (*) 70 - 99 mg/dL   Comment 1 Notify RN      Ct Head Wo Contrast  10/01/2013   CLINICAL DATA:  Intermittent confusion and poor communication, 2 episodes of urinary incontinence.  EXAM: CT HEAD WITHOUT CONTRAST  TECHNIQUE: Contiguous axial images were obtained from the base of the skull through the vertex without intravenous contrast.  COMPARISON:  MRI of the brain September 27, 2013. CT of the head October 03, 2013  FINDINGS: The ventricles and sulci are normal for age. No intraparenchymal hemorrhage, mass effect nor midline shift. Confluent supratentorial white matter hypodensities are within normal range for patient's age and though non-specific suggest sequelae of chronic small vessel ischemic disease. Bilateral basal ganglia mineralization. No acute large vascular territory infarcts.  No abnormal extra-axial fluid collections. Basal cisterns are patent. Moderate calcific atherosclerosis of the carotid siphons.  No skull fracture. Small bony excrescence from the inner table of the left high frontal calvarium could reflect a meningioma without mass effect. Mild maxillary mucosal thickening with small air-fluid level on the right. Mastoid air cells are well aerated. The included ocular globes and orbital contents are non-suspicious. Status post bilateral ocular lens implants.  IMPRESSION: No acute intracranial process.  Involutional changes. Severe white matter changes, similar to prior  examination in though nonspecific may reflect chronic small vessel ischemic disease.  Mild apparent acute on chronic maxillary status.   Electronically Signed   By: Elon Alas   On: 10/01/2013 22:11   Assessment/Plan: 80 y/o with progressive decline in mental status and apparently more acute onset of inability to walk. Prior MRI brain revealed significant, progressive white matter involvement of unclear etiology although certainly could be a manifestation of underlying small vessel disease worsened by concomitant metabolic brain disorder. His HIV test was negative. This degree of white matter involvement is sufficient enough to induce cognitive and gait impairment, but the truth of the matter is that at this moment we don't know the specific etiology of patient's clinical-radiologic syndrome. Will suggest repeating MRI, EEG, and then probably  pursuing LP to investigate other potential causes of leukoencephalopathy. Will follow up.  Dorian Pod, MD 10/03/2013, 6:40 PM [facility].

## 2013-10-03 NOTE — Clinical Social Work Psychosocial (Signed)
Clinical Social Work Department BRIEF PSYCHOSOCIAL ASSESSMENT 10/03/2013  Patient:  [name]     Account Number:  [id number]     Admit date:  10/01/2013  Clinical Social Worker:  Lavell Luster  Date/Time:  10/03/2013 10:37 AM  Referred by:  Physician  Date Referred:  10/03/2013 Referred for  SNF Placement   Other Referral:   Interview type:  Family Other interview type:   Patient in hemodialysis and per chart is not oriented x4. CSW interviewed wife over phone as no family at bedside.    PSYCHOSOCIAL DATA Living Status:  WIFE Admitted from facility:   Level of care:   Primary support name:  [name] Primary support relationship to patient:  SPOUSE Degree of support available:   Support is adequate. Patient lives with wife, and his son and daughter in law live "down the street".    CURRENT CONCERNS Current Concerns  Post-Acute Placement   Other Concerns:    SOCIAL WORK ASSESSMENT / PLAN CSW spoke with wife about the recommendation for SNF for patient. Wife states that patient would not be agreeable to SNF placement and would like to bring him home at discharge. Wife states that the son would not be agreeable to this either. CSW inquired about her ability to take care of patient at home. Wife states that she has taken care of patient at home before in the past and has utilized HHPT services with [facility]. She would like this same [facility] agency (RNCM notified). She also states that son and daughter in law live "down the street" and says that daughter in law has "experience working with patients." CSW asked wife that if (hypothetically speaking) patient were to discharge tomorrow would the family be comfortable taking him home. She replied "yes." CSW will sign off at this time.   Assessment/plan status:  No Further Intervention Required Other assessment/ plan:   NONE   Information/referral to community resources:   CSW contact information given to wife.     PATIENT'S/FAMILY'S RESPONSE TO PLAN OF CARE: Wife not agreeable to SNF placement at this time. She plans to bring patient home with HHPT from [facility]. Wife was pleasant, appropriate, and appreciative of CSW contact. CSW signing off at this time.       Roddie Mc, [place], [place], [phone]

## 2013-10-03 NOTE — Clinical Social Work Note (Signed)
CSW spoke with patient's daughter in law. She states that patient's son is patient's HPOA. CSW encouraged her to get this paperwork to unit ASAP. Family is now agreeable to SNF search.Roddie Mc, LCSWA, [place], [phone]

## 2013-10-03 NOTE — Progress Notes (Signed)
[facility] PROGRESS NOTE   [name] BZJ:[id number] DOB: 11/21/1931 DOA: 10/01/2013 PCP: Cecille Aver, MD  HPI/Subjective: Patient is very lethargic.  Per daughter change in mental status happened acutely on 1/20.  On that day prior to dialysis he was able to walk and perform his ADLs.  After HD he was unable to walk.  He was admitted from 1/20 - 1/24.  His valproic acid level was 107.5.  It was discontinued and the patient was started on Vimpat.  He improved but did not get back to baseline.  He was discharged to home.  At home his mental status declined very quickly.  He was unable to remember recent conversations and unable to perform ADLs once again.  He was readmitted on 1/26.  Per the daughter he is much more lethargic today than he was on admission (1/26).  She is rather alarmed and concerned.   The patient's wife is at bedside but has ST memory loss.  Consequently the daughter in law speaks for the patient.  Reportedly he has no history of altered mental status.  He had a severe  infection in his back years ago caused by mole cricket dung.  This infection caused a 3 mo hospital stay.  Assessment/Plan: Principal Problem:   Metabolic encephalopathy Active Problems:   Anemia in chronic kidney disease   Diabetes type 2, controlled   Seizures   Altered mental state   ESRD on dialysis   Urinary incontinence   Other pancytopenia   Failure to thrive in adult   Acute metabolic encephalopathy -Progressively becoming more lethargic per the daughter in law.  (normally talkative) -Unclear etiology, patient has baseline of dementia. -Normal folate and B12, normal ammonia level. -Urinalysis showed hyaline casts but no multiple pus cells. -MRI 1/22 mentions severe white matter changes (possible metabolic or infectious encephalopathy) -Patient follows simple commands so doubt this is seizure.   -We will avoid benzodiazepines and narcotics. -check blood cultures. -Will ask  Neuro to re-eval.  Pending their recommendations we may consider LP or ID consultation.  ESRD -Patient is on hemodialysis Tuesday, Thursday and Saturday. Renal notified.  Anemia -Likely secondary to chronic kidney disease. Patient is on Aranesp and ferrous gluconate.  HTN -Continue home medications.  Diabetes mellitus type 2 -Not on any medication.  Disposition -Patient likely will need skilled nursing facility, per previous note family declined. -Per RN patient lives with wife who  has memory problems.  Code Status: Full code Family Communication: Plan discussed with the patient. Disposition Plan: Remains inpatient   Consultants:  Nephrology  Procedures:  None  Antibiotics:  Rocephin   Objective: Filed Vitals:   10/03/13 1342  BP: 128/64  Pulse:   Temp:   Resp:     Intake/Output Summary (Last 24 hours) at 10/03/13 1622 Last data filed at 10/03/13 1131  Gross per 24 hour  Intake     50 ml  Output   2225 ml  Net  -2175 ml   Filed Weights   10/01/13 2050 10/03/13 0710  Weight: 93.2 kg (205 lb 7.5 oz) 93.2 kg (205 lb 7.5 oz)    Exam: General: Somewhat Alert and awake, follows some commands. But very slow, not in any acute distress. HEENT: anicteric sclera, pupils reactive to light and accommodation, EOMI CVS: S1-S2 clear, no murmur rubs or gallops Chest: clear to auscultation bilaterally, no wheezing, rales or rhonchi Abdomen: soft nontender, nondistended, normal bowel sounds, no organomegaly Extremities: no cyanosis, clubbing or edema noted bilaterally Neuro: Cranial  nerves II-XII intact, no focal neurological deficits  Data Reviewed: Basic Metabolic Panel:  Recent Labs Lab 09/27/13 0539 09/29/13 0504 10/01/13 1515 10/03/13 0709  NA 137 140 136* 136*  K 3.9 4.9 4.3 4.8  CL 96 99 97 98  CO2 26 24 24 22   GLUCOSE 109* 134* 167* 132*  BUN 47* 31* 36* 55*  CREATININE 6.80* 5.94* 6.65* 8.43*  CALCIUM 8.6 9.2 9.2 8.7  PHOS  --  4.6  --  5.1*     Liver Function Tests:  Recent Labs Lab 09/29/13 0504 10/01/13 1515 10/03/13 0709  AST  --  13  --   ALT  --  5  --   ALKPHOS  --  42  --   BILITOT  --  0.4  --   PROT  --  6.9  --   ALBUMIN 3.1* 3.0* 2.6*    Recent Labs Lab 10/01/13 2256  AMMONIA 20   CBC:  Recent Labs Lab 09/27/13 0539 09/29/13 0504 10/01/13 1515 10/03/13 0709  WBC 2.4* 3.8* 5.9 4.9  NEUTROABS  --   --  3.9  --   HGB 10.2* 10.6* 9.6* 8.1*  HCT 30.2* 31.4* 29.0* 23.9*  MCV 103.8* 104.3* 105.5* 100.4*  PLT 78* 72* 82* 107*   CBG:  Recent Labs Lab 10/02/13 0732 10/02/13 1113 10/02/13 1752 10/02/13 2209 10/03/13 1227  GLUCAP 139* 132* 204* 215* 122*     Studies: Ct Head Wo Contrast  10/01/2013   CLINICAL DATA:  Intermittent confusion and poor communication, 2 episodes of urinary incontinence.  EXAM: CT HEAD WITHOUT CONTRAST  TECHNIQUE: Contiguous axial images were obtained from the base of the skull through the vertex without intravenous contrast.  COMPARISON:  MRI of the brain September 27, 2013. CT of the head October 03, 2013  FINDINGS: The ventricles and sulci are normal for age. No intraparenchymal hemorrhage, mass effect nor midline shift. Confluent supratentorial white matter hypodensities are within normal range for patient's age and though non-specific suggest sequelae of chronic small vessel ischemic disease. Bilateral basal ganglia mineralization. No acute large vascular territory infarcts.  No abnormal extra-axial fluid collections. Basal cisterns are patent. Moderate calcific atherosclerosis of the carotid siphons.  No skull fracture. Small bony excrescence from the inner table of the left high frontal calvarium could reflect a meningioma without mass effect. Mild maxillary mucosal thickening with small air-fluid level on the right. Mastoid air cells are well aerated. The included ocular globes and orbital contents are non-suspicious. Status post bilateral ocular lens implants.   IMPRESSION: No acute intracranial process.  Involutional changes. Severe white matter changes, similar to prior examination in though nonspecific may reflect chronic small vessel ischemic disease.  Mild apparent acute on chronic maxillary status.   Electronically Signed   By: October 05, 2013   On: 10/01/2013 22:11    Scheduled Meds: . amLODipine  10 mg Oral Daily  . calcium carbonate  1 tablet Oral TID WC  . cefTRIAXone (ROCEPHIN)  IV  1 g Intravenous Q24H  . [START ON 10/04/2013] darbepoetin  60 mcg Intravenous Q Thu-HD  . doxercalciferol  4 mcg Intravenous Q T,Th,Sa-HD  . [START ON 10/04/2013] ferric gluconate (FERRLECIT/NULECIT) IV  62.5 mg Intravenous Q Thu-HD  . folic acid  1 mg Oral Daily  . insulin aspart  0-9 Units Subcutaneous TID WC  . lacosamide  100 mg Oral BID  . multivitamin  1 tablet Oral QHS  . pantoprazole  40 mg Oral BID   Continuous  Infusions:     Stephani Police, [place] - C  [facility] Pager [phone]  If 7PM-7AM, please contact night-coverage at [email or web address], password [facility] 10/03/2013, 4:22 PM  LOS: 2 days     Attending  Seen and examined, agree with the assessment and plan. Just discharged a few days back, readmitted with worsening mental status-almost similar to last admit.-AMS last admit-thought to be from Depakote toxicity. Depakote was discontinued and placed on Vimpat, prolonged EEG was negative.After a brief period of improvement, patient brought back to gen weakness, lethargy. Very Slow-but follows most commands, and answers some questions appropriately. No evidence of any UTI, PNA-will get blood cultures-however afebrile. Await Neuro eval.  Windell Norfolk MD

## 2013-10-03 NOTE — Progress Notes (Signed)
Subjective:  Seen on dialysis, no complaints  Objective: Vital signs in last 24 hours: Temp:  [98 F (36.7 C)-98.7 F (37.1 C)] 98.7 F (37.1 C) (01/28 0710) Pulse Rate:  [77-83] 80 (01/28 0800) Resp:  [16-20] 17 (01/28 0800) BP: (128-153)/(61-77) 128/65 mmHg (01/28 0800) SpO2:  [96 %-97 %] 96 % (01/28 0710) Weight:  [93.2 kg (205 lb 7.5 oz)] 93.2 kg (205 lb 7.5 oz) (01/28 0710) Weight change:   Intake/Output from previous day: 01/27 0701 - 01/28 0700 In: 170 [P.O.:120; IV Piggyback:50] Out: 1125 [Urine:1125]   Lab Results:  Recent Labs  10/01/13 1515 10/03/13 0709  WBC 5.9 4.9  HGB 9.6* 8.1*  HCT 29.0* 23.9*  PLT 82* 107*   BMET:  Recent Labs  10/01/13 1515 10/03/13 0709  NA 136* 136*  K 4.3 4.8  CL 97 98  CO2 24 22  GLUCOSE 167* 132*  BUN 36* 55*  CREATININE 6.65* 8.43*  CALCIUM 9.2 8.7  ALBUMIN 3.0* 2.6*   No results found for this basename: PTH,  in the last 72 hours Iron Studies: No results found for this basename: IRON, TIBC, TRANSFERRIN, FERRITIN,  in the last 72 hours  EXAM: General appearance:  Alert, in no apparent distress Resp:  CTA without rales, rhonchi, or wheezes Cardio:  RRR with Gr II/VI systolic murmur, no rub GI: + BS, soft and nontender Extremities:  No edema Access:  AVG @ [facility] with BFR 450 cc/min  Dialysis Orders: [place] @ [place]  4 hrs 93.5 kg 2K/2.25Ca 450/A1.5 Heparin 3000 U AVF @ [facility]  Hectorol 4 mcg Epogen 9000 U Venofer 50 mg on Thurs  Assessment/Plan: 1. AMS - unclear etiology, @ [facility] 1/20 - 24 when valproic acid was dc'd sec to toxicity; ? UTI per UA, culture pending, on Rocephin; possible advanced dementia with progressive white matter disease per MRI 1/22, CT yesterday with no acute process.  2. Seizure disorder - now on Vimpat.  3. ESRD - HD on TTS @ [place]; K 4.8, last HD in hospital 1/24. HD today off schedule.  4. Hypertension/volume - BP 128/65 on Amlodipine 10 mg qd; currently below EDW with negative chest x-ray.  5. Anemia  - Hgb 9.6 on outpatient Epogen & weekly Fe. Aranesp 100 mcg today.  6. Metabolic bone disease - Ca 9.2 (10 corrected), P 4.6; Hectorol 4 mcg, no binders.  7. Nutrition - Alb 3; renal diet & vitamin.  8. DM Type 2 - diet-controlled.   LOS: 2 days   LYLES,CHARLES 10/03/2013,8:25 AM  I have seen and examined patient, discussed with PA and agree with assessment and plan as outlined above.  HD today, unclear cause of MS changes, no medications implicated and not uremic clinically. May have dementia.   [name] pager [phone]    cell [phone] 10/03/2013, 11:52 AM

## 2013-10-04 ENCOUNTER — Ambulatory Visit (HOSPITAL_COMMUNITY): Payer: Medicare Other

## 2013-10-04 DIAGNOSIS — G9341 Metabolic encephalopathy: Principal | ICD-10-CM

## 2013-10-04 DIAGNOSIS — E119 Type 2 diabetes mellitus without complications: Secondary | ICD-10-CM

## 2013-10-04 LAB — GLUCOSE, CAPILLARY
Glucose-Capillary: 142 mg/dL — ABNORMAL HIGH (ref 70–99)
Glucose-Capillary: 184 mg/dL — ABNORMAL HIGH (ref 70–99)

## 2013-10-04 LAB — CBC
HCT: 26.1 % — ABNORMAL LOW (ref 39.0–52.0)
HEMOGLOBIN: 8.9 g/dL — AB (ref 13.0–17.0)
MCH: 34.6 pg — ABNORMAL HIGH (ref 26.0–34.0)
MCHC: 34.1 g/dL (ref 30.0–36.0)
MCV: 101.6 fL — ABNORMAL HIGH (ref 78.0–100.0)
PLATELETS: 113 10*3/uL — AB (ref 150–400)
RBC: 2.57 MIL/uL — ABNORMAL LOW (ref 4.22–5.81)
RDW: 13.3 % (ref 11.5–15.5)
WBC: 4.7 10*3/uL (ref 4.0–10.5)

## 2013-10-04 LAB — BASIC METABOLIC PANEL
BUN: 33 mg/dL — ABNORMAL HIGH (ref 6–23)
CO2: 25 mEq/L (ref 19–32)
Calcium: 8.7 mg/dL (ref 8.4–10.5)
Chloride: 95 mEq/L — ABNORMAL LOW (ref 96–112)
Creatinine, Ser: 5.76 mg/dL — ABNORMAL HIGH (ref 0.50–1.35)
GFR calc Af Amer: 10 mL/min — ABNORMAL LOW (ref >90)
GFR, EST NON AFRICAN AMERICAN: 8 mL/min — AB (ref >90)
Glucose, Bld: 151 mg/dL — ABNORMAL HIGH (ref 70–99)
POTASSIUM: 4.2 meq/L (ref 3.7–5.3)
SODIUM: 136 meq/L — AB (ref 137–147)

## 2013-10-04 MED ORDER — DOXERCALCIFEROL 4 MCG/2ML IV SOLN
INTRAVENOUS | Status: AC
  Administered 2013-10-04: 2 ug via INTRAVENOUS
  Filled 2013-10-04: qty 2

## 2013-10-04 MED ORDER — DARBEPOETIN ALFA-POLYSORBATE 60 MCG/0.3ML IJ SOLN
INTRAMUSCULAR | Status: AC
  Administered 2013-10-04: 60 ug via INTRAVENOUS
  Filled 2013-10-04: qty 0.3

## 2013-10-04 NOTE — ED Provider Notes (Signed)
I have personally seen and examined the patient.  I have discussed the plan of care with the resident.  I have reviewed the documentation on PMH/FH/Soc. History.  I have reviewed the documentation of the resident and agree.  I have reviewed and agree with the ECG interpretation(s) documented by the resident.   Pt awake/alert on my evaluation, but Pt unable to ambulate in the ED.  He is high risk for falls.  Admission advised   Joya Gaskins, MD 10/04/13 1157

## 2013-10-04 NOTE — Progress Notes (Signed)
PATIENT DETAILS Name: [name] Age: 80 y.o. Sex: male Date of Birth: 1931/10/02 Admit Date: 10/01/2013 Admitting Physician Dewayne Shorter Levora Dredge, MD TKW:[id number],QASTMH A, MD  Subjective: Seems to be slightly better today. Still very weak and lethargic. No family at bedside, but seems to be more conversant than yesterday-still overall very slow  Assessment/Plan: Principal Problem: Altered mental status - Suspicion for acute metabolic encephalopathy-etiology unknown. No fever, leukocytosis or any other source of infection to suggest that this is from sepsis. - Similar presentation last admission very recently, this was attributes it to supratherapeutic Depakote levels. MRI brain and prolonged EEG during that admission was negative. Apparently patient was switched over to Vimpat, made some clinical improvement and then was discharged home. He should return in a few days with recurrence of altered mental status. - Neurology has been consulted, current plans are to repeat EEG and MRI brain. Possible lumbar puncture depending on clinical course  End-stage renal disease - On hemodialysis, nephrology following  Anemia - Suspect are seen hemoglobin secondary to Depakote toxicity, slowly hemoglobin improving. Baseline anemia of chronic disease secondary to end-stage disease.  Thrombocytopenia - Secondary to recent Depakote toxicity- platelet count improved. Continue to monitor  History of seizure disorder - Continue with Vimpat  Hypertension - Blood pressure controlled - Continue with amlodipine  Diabetes - CBGs controlled - Continue with SSI  Disposition: Remain inpatient  DVT Prophylaxis: SCD's- given thrombocytopenia.  Code Status: Full code   Family Communication Spouse and daughter-in-law yesterday  Procedures:  None  CONSULTS:  nephrology and neurology  Time spent 40 minutes-which includes 50% of the time with face-to-face with patient/ family and  coordinating care related to the above assessment and plan.    MEDICATIONS: Scheduled Meds: . amLODipine  10 mg Oral Daily  . calcium carbonate  1 tablet Oral TID WC  . darbepoetin  60 mcg Intravenous Q Thu-HD  . doxercalciferol  4 mcg Intravenous Q T,Th,Sa-HD  . ferric gluconate (FERRLECIT/NULECIT) IV  62.5 mg Intravenous Q Thu-HD  . folic acid  1 mg Oral Daily  . insulin aspart  0-9 Units Subcutaneous TID WC  . lacosamide  100 mg Oral BID  . multivitamin  1 tablet Oral QHS  . pantoprazole  40 mg Oral BID   Continuous Infusions:  PRN Meds:.acetaminophen, acetaminophen, LORazepam  Antibiotics: Anti-infectives   Start     Dose/Rate Route Frequency Ordered Stop   10/01/13 2200  cefTRIAXone (ROCEPHIN) 1 g in dextrose 5 % 50 mL IVPB  Status:  Discontinued     1 g 100 mL/hr over 30 Minutes Intravenous Every 24 hours 10/01/13 2040 10/04/13 0941       PHYSICAL EXAM: Vital signs in last 24 hours: Filed Vitals:   10/04/13 1000 10/04/13 1030 10/04/13 1100 10/04/13 1105  BP: 109/54 106/57 108/49 130/51  Pulse: 91 89 90 96  Temp:    97.8 F (36.6 C)  TempSrc:    Oral  Resp:    18  Height:      Weight:    91.1 kg (200 lb 13.4 oz)  SpO2:    96%    Weight change:  Filed Weights   10/03/13 0710 10/04/13 0735 10/04/13 1105  Weight: 93.2 kg (205 lb 7.5 oz) 92.6 kg (204 lb 2.3 oz) 91.1 kg (200 lb 13.4 oz)   Body mass index is 26.5 kg/(m^2).   Gen Exam: Awake and alert with clear speech.  Very slow. Neck: Supple, No  JVD.   Chest: B/L Clear.   CVS: S1 S2 Regular, no murmurs.  Abdomen: soft, BS +, non tender, non distended.  Extremities: no edema, lower extremities warm to touch. Neurologic: Non Focal- generalized weakness Skin: No Rash.   Wounds: N/A.   Intake/Output from previous day:  Intake/Output Summary (Last 24 hours) at 10/04/13 1223 Last data filed at 10/04/13 1105  Gross per 24 hour  Intake    240 ml  Output   1900 ml  Net  -1660 ml     LAB  RESULTS: CBC  Recent Labs Lab 09/29/13 0504 10/01/13 1515 10/03/13 0709 10/04/13 0650  WBC 3.8* 5.9 4.9 4.7  HGB 10.6* 9.6* 8.1* 8.9*  HCT 31.4* 29.0* 23.9* 26.1*  PLT 72* 82* 107* 113*  MCV 104.3* 105.5* 100.4* 101.6*  MCH 35.2* 34.9* 34.0 34.6*  MCHC 33.8 33.1 33.9 34.1  RDW 13.5 13.9 13.0 13.3  LYMPHSABS  --  0.9  --   --   MONOABS  --  1.1*  --   --   EOSABS  --  0.1  --   --   BASOSABS  --  0.0  --   --     Chemistries   Recent Labs Lab 09/29/13 0504 10/01/13 1515 10/03/13 0709 10/04/13 0650  NA 140 136* 136* 136*  K 4.9 4.3 4.8 4.2  CL 99 97 98 95*  CO2 24 24 22 25   GLUCOSE 134* 167* 132* 151*  BUN 31* 36* 55* 33*  CREATININE 5.94* 6.65* 8.43* 5.76*  CALCIUM 9.2 9.2 8.7 8.7    CBG:  Recent Labs Lab 10/02/13 2209 10/03/13 1227 10/03/13 1710 10/03/13 2116 10/04/13 1203  GLUCAP 215* 122* 195* 226* 142*    GFR Estimated Creatinine Clearance: 11.8 ml/min (by C-G formula based on Cr of 5.76).  Coagulation profile No results found for this basename: INR, PROTIME,  in the last 168 hours  Cardiac Enzymes No results found for this basename: CK, CKMB, TROPONINI, MYOGLOBIN,  in the last 168 hours  No components found with this basename: POCBNP,  No results found for this basename: DDIMER,  in the last 72 hours No results found for this basename: HGBA1C,  in the last 72 hours No results found for this basename: CHOL, HDL, LDLCALC, TRIG, CHOLHDL, LDLDIRECT,  in the last 72 hours  Recent Labs  10/01/13 2256  TSH 1.527    Recent Labs  10/01/13 2256 10/02/13 0810  VITAMINB12 884  --   FOLATE  --  18.4   No results found for this basename: LIPASE, AMYLASE,  in the last 72 hours  Urine Studies No results found for this basename: UACOL, UAPR, USPG, UPH, UTP, UGL, UKET, UBIL, UHGB, UNIT, UROB, ULEU, UEPI, UWBC, URBC, UBAC, CAST, CRYS, UCOM, BILUA,  in the last 72 hours  MICROBIOLOGY: Recent Results (from the past 240 hour(s))  URINE CULTURE      Status: None   Collection Time    10/01/13  3:39 PM      Result Value Range Status   Specimen Description URINE, CATHETERIZED   Final   Special Requests ADDED 0056 10/02/13   Final   Culture  Setup Time     Final   Value: 10/02/2013 12:02     Performed at 10/04/2013   Colony Count     Final   Value: 45,000 COLONIES/ML     Performed at [facility]   Culture     Final   Value: [facility]  NEGATIVE RODS     [facility]   Report Status PENDING   Incomplete    RADIOLOGY STUDIES/RESULTS: Dg Chest 2 View  09/25/2013   CLINICAL DATA:  Altered mental status.  EXAM: CHEST  2 VIEW  COMPARISON:  01/24/2013  FINDINGS: Two views of the chest were obtained. Mild fullness in the right peritracheal region is unchanged. Heart size is normal. No evidence for airspace disease or edema. No evidence for pleural effusions.  IMPRESSION: No acute chest findings.   Electronically Signed   By: [name] M.D.   On: 09/25/2013 21:37   Ct Head Wo Contrast  10/01/2013   CLINICAL DATA:  Intermittent confusion and poor communication, 2 episodes of urinary incontinence.  EXAM: CT HEAD WITHOUT CONTRAST  TECHNIQUE: Contiguous axial images were obtained from the base of the skull through the vertex without intravenous contrast.  COMPARISON:  MRI of the brain September 27, 2013. CT of the head October 03, 2013  FINDINGS: The ventricles and sulci are normal for age. No intraparenchymal hemorrhage, mass effect nor midline shift. Confluent supratentorial white matter hypodensities are within normal range for patient's age and though non-specific suggest sequelae of chronic small vessel ischemic disease. Bilateral basal ganglia mineralization. No acute large vascular territory infarcts.  No abnormal extra-axial fluid collections. Basal cisterns are patent. Moderate calcific atherosclerosis of the carotid siphons.  No skull fracture. Small bony excrescence from the inner table of the left high frontal  calvarium could reflect a meningioma without mass effect. Mild maxillary mucosal thickening with small air-fluid level on the right. Mastoid air cells are well aerated. The included ocular globes and orbital contents are non-suspicious. Status post bilateral ocular lens implants.  IMPRESSION: No acute intracranial process.  Involutional changes. Severe white matter changes, similar to prior examination in though nonspecific may reflect chronic small vessel ischemic disease.  Mild apparent acute on chronic maxillary status.   Electronically Signed   By: [name]   On: 10/01/2013 22:11   Ct Head Wo Contrast  09/25/2013   CLINICAL DATA:  Altered level of consciousness. Altered mental status.  EXAM: CT HEAD WITHOUT CONTRAST  TECHNIQUE: Contiguous axial images were obtained from the base of the skull through the vertex without intravenous contrast.  COMPARISON:  01/15/2013  FINDINGS: There is no evidence of intracranial hemorrhage, brain edema, or other signs of acute infarction. There is no evidence of intracranial mass lesion or mass effect. No abnormal extraaxial fluid collections are identified.  Mild cerebral atrophy is stable. Extensive chronic small vessel disease is also unchanged in appearance. Ventricles are stable in size. No evidence of skull fracture or other significant bone abnormality.  IMPRESSION: No acute intracranial findings.  Stable chronic small vessel disease and mild cerebral atrophy.   Electronically Signed   By: [name] M.D.   On: 09/25/2013 20:04   Mr Brain Wo Contrast  09/27/2013   CLINICAL DATA:  Altered mental status.  EXAM: MRI HEAD WITHOUT CONTRAST  TECHNIQUE: Multiplanar, multiecho pulse sequences of the brain and surrounding structures were obtained without intravenous contrast.  COMPARISON:  Head CT 09/25/2013 and brain MRI 01/16/2013  FINDINGS: Images are mildly to moderately degraded by motion artifact.  Again seen are multiple punctate foci of susceptibility  artifact within both cerebral hemispheres, greatest in the occipital lobes and posterior left temporal lobe. These are have slightly increased in number from the prior study and are compatible with remote microhemorrhages. There is no acute infarct. Confluent T2  hyperintensity within the subcortical and deep cerebral white matter bilaterally has increased significantly from the prior MRI, particularly in the frontal lobes. There is moderate cerebral atrophy, also increased from the prior MRI. There is no evidence of mass, midline shift, or extra-axial fluid collection.  Major intracranial vascular flow voids are unremarkable. Prior bilateral cataract surgery is noted. Mild bilateral ethmoid and maxillary sinus mucosal thickening is noted. Mastoid air cells are clear. Limited visualization of the upper cervical spine demonstrates moderate multilevel spondylosis.  IMPRESSION: 1. No evidence of acute infarct. 2. Significant interval progression of white matter disease from prior MRI. While this may represent progression of chronic microvascular ischemia, other etiologies, such as metabolic and infectious encephalopathies (including HIV), are not excluded. 3. Mild interval progression of cerebral atrophy and bilateral cerebral microhemorrhages.   Electronically Signed   By: Sebastian Ache   On: 09/27/2013 19:53   Dg Chest Portable 1 View  10/01/2013   CLINICAL DATA:  Incontinence.  Confusion.  Weakness.  EXAM: PORTABLE CHEST - 1 VIEW  COMPARISON:  09/25/2013  FINDINGS: Right paratracheal densities attributable to ectatic vasculature. Heart size within normal limits. Low lung volumes are present, causing crowding of the pulmonary vasculature.  The lungs appear clear.  IMPRESSION: No acute thoracic findings.   Electronically Signed   By: Herbie Baltimore M.D.   On: 10/01/2013 18:25    Jeoffrey Massed, MD  [facility] Pager:[phone]  If 7PM-7AM, please contact night-coverage [email or web address] Password  [email or web address] 10/04/2013, 12:23 PM   LOS: 3 days

## 2013-10-04 NOTE — Clinical Social Work Placement (Signed)
Clinical Social Work Department CLINICAL SOCIAL WORK PLACEMENT NOTE 10/04/2013  Patient:  [facility]  Account Number:  [id number] Admit date:  10/01/2013  Clinical Social Worker:  Cherre Blanc, [place]  Date/time:  10/03/2013 03:30 PM  Clinical Social Work is seeking post-discharge placement for this patient at the following level of care:   SKILLED NURSING   (*CSW will update this form in Epic as items are completed)   10/03/2013  Patient/family provided with [facility] Department of Clinical Social Work's list of facilities offering this level of care within the geographic area requested by the patient (or if unable, by the patient's family).  10/03/2013  Patient/family informed of their freedom to choose among providers that offer the needed level of care, that participate in Medicare, Medicaid or managed care program needed by the patient, have an available bed and are willing to accept the patient.  10/03/2013  Patient/family informed of [facility]' ownership interest in [facility], as well as of the fact that they are under no obligation to receive care at this facility.  PASARR submitted to EDS on 10/03/2013 PASARR number received from EDS on 10/03/2013  FL2 transmitted to all facilities in geographic area requested by pt/family on  10/03/2013 FL2 transmitted to all facilities within larger geographic area on   Patient informed that his/her managed care company has contracts with or will negotiate with  certain facilities, including the following:     Patient/family informed of bed offers received:  10/04/2013 Patient chooses bed at  Physician recommends and patient chooses bed at    Patient to be transferred to  on   Patient to be transferred to facility by   The following physician request were entered in Epic:   Additional Comments:    Roddie Mc, Bryon Lions, [phone]

## 2013-10-04 NOTE — Procedures (Signed)
I was present at this dialysis session, have reviewed the session itself and made  appropriate changes  [name] (pgr) [phone]    ([phone] 10/04/2013, 10:56 AM

## 2013-10-04 NOTE — Progress Notes (Signed)
Physical Therapy Treatment Patient Details Name: [name] MRN: [id number] DOB: [birth year] Today's Date: 10/04/2013 Time: 2409-7353 PT Time Calculation (min): 13 min  PT Assessment / Plan / Recommendation  History of Present Illness 80 year old male with history of end-stage renal disease on dialysis (tuesdays, Thursdays and Saturdays), seizure disorder, diet controlled diabetes, anemia of chronic kidney disease who was recently admitted and discharged 2 days back after being evaluated for altered mental status. Family had noticed significant changes in his personality. He was admitted to the hospital and worked up for altered mental status with negative workup for CVA and seizures. His valproate was discontinued as patient was developing pancytopenia. Neurology was consulted at that time. Patient's mental status had started to improve (although was not at baseline). Patient was seen by PT OT and was discharged home (family had declined for skilled nursing facility).   PT Comments   Pt with decline in balance and strength since eval on Wednesday. Currently pt will need lift equipment for OOB.  Follow Up Recommendations  SNF     Does the patient have the potential to tolerate intense rehabilitation     Barriers to Discharge        Equipment Recommendations  None recommended by PT    Recommendations for Other Services    Frequency Min 2X/week   Progress towards PT Goals Progress towards PT goals: Not progressing toward goals - comment (Incr weakness and decr balance)  Plan Current plan remains appropriate    Precautions / Restrictions Precautions Precautions: Fall   Pertinent Vitals/Pain No c/o's    Mobility  Bed Mobility Supine to sit: +2 for physical assistance;Total assist Sit to supine: +2 for physical assistance;Total assist General bed mobility comments: Assist to move legs and to manage trunk.    Exercises     PT Diagnosis:    PT Problem List:   PT Treatment  Interventions:     PT Goals (current goals can now be found in the care plan section)    Visit Information  Last PT Received On: 10/04/13 Assistance Needed: +2 History of Present Illness: 80 year old male with history of end-stage renal disease on dialysis (tuesdays, Thursdays and Saturdays), seizure disorder, diet controlled diabetes, anemia of chronic kidney disease who was recently admitted and discharged 2 days back after being evaluated for altered mental status. Family had noticed significant changes in his personality. He was admitted to the hospital and worked up for altered mental status with negative workup for CVA and seizures. His valproate was discontinued as patient was developing pancytopenia. Neurology was consulted at that time. Patient's mental status had started to improve (although was not at baseline). Patient was seen by PT OT and was discharged home (family had declined for skilled nursing facility).    Subjective Data      Cognition  Cognition Arousal/Alertness: Awake/alert Behavior During Therapy: Flat affect Overall Cognitive Status: No family/caregiver present to determine baseline cognitive functioning Area of Impairment: Following commands;Problem solving Following Commands: Follows one step commands inconsistently Problem Solving: Slow processing;Decreased initiation;Requires verbal cues;Requires tactile cues    Balance  Balance Overall balance assessment: Needs assistance Sitting-balance support: Bilateral upper extremity supported;Feet supported Sitting balance-Leahy Scale: Zero Sitting balance - Comments: Pt with very heavy lean to the rt. Had pt lean over on to lt elbow to try and facilitate more of a midline position with very little improvement. Pt sat x 5 minutes with 2 person max assist. Postural control: Right lateral lean  End of Session PT -  End of Session Activity Tolerance: Patient limited by fatigue Patient left: in bed;with call bell/phone  within reach;with bed alarm set Nurse Communication: Mobility status;Need for lift equipment   GP     [name] 10/04/2013, 2:48 PM  [facility] PT [phone]

## 2013-10-04 NOTE — Progress Notes (Signed)
Subjective:  More attentive today, but still not talkative, no complaints  Objective: Vital signs in last 24 hours: Temp:  [97.8 F (36.6 C)-100.1 F (37.8 C)] 97.8 F (36.6 C) (01/29 1105) Pulse Rate:  [83-96] 96 (01/29 1105) Resp:  [18] 18 (01/29 1105) BP: (106-134)/(49-65) 130/51 mmHg (01/29 1105) SpO2:  [94 %-96 %] 96 % (01/29 1105) Weight:  [91.1 kg (200 lb 13.4 oz)-92.6 kg (204 lb 2.3 oz)] 91.1 kg (200 lb 13.4 oz) (01/29 1105) Weight change:   Intake/Output from previous day: 01/28 0701 - 01/29 0700 In: 240 [P.O.:240] Out: 1900 [Urine:400] Intake/Output this shift: Total I/O In: -  Out: 1500 [Other:1500]  Lab Results:  Recent Labs  10/03/13 0709 10/04/13 0650  WBC 4.9 4.7  HGB 8.1* 8.9*  HCT 23.9* 26.1*  PLT 107* 113*   BMET:  Recent Labs  10/03/13 0709 10/04/13 0650  NA 136* 136*  K 4.8 4.2  CL 98 95*  CO2 22 25  GLUCOSE 132* 151*  BUN 55* 33*  CREATININE 8.43* 5.76*  CALCIUM 8.7 8.7  ALBUMIN 2.6*  --    No results found for this basename: PTH,  in the last 72 hours Iron Studies: No results found for this basename: IRON, TIBC, TRANSFERRIN, FERRITIN,  in the last 72 hours  EXAM:  General appearance: Alert, in no apparent distress  Resp: CTA without rales, rhonchi, or wheezes  Cardio: RRR with Gr II/VI systolic murmur, no rub  GI: + BS, soft and nontender  Extremities: No edema  Access: AVG @ [facility] with BFR 450 cc/min   Dialysis Orders: [facility]  4 hrs 93.5 kg 2K/2.25Ca 450/A1.5 Heparin 3000 U AVF @ [facility]  Hectorol 4 mcg Epogen 9000 U Venofer 50 mg on Thurs  Assessment/Plan: 1. AMS - unclear etiology, @ [facility] 1/20 - 24 when valproic acid was dc'd sec to toxicity; possible advanced dementia with progressive white matter disease per MRI 1/22, CT 1/27 with no acute process. Neurology consult pending. 2. Seizure disorder - now on Vimpat.  3. ESRD - HD on TTS @ [place]; K 4.2. Next HD tomorrow off schedule. 4. Hypertension/volume - BP 124/60 on Amlodipine  10 mg qd; wt 91.1 kg, still below EDW. 5. Anemia - Hgb 9.6 on outpatient Epogen & weekly Fe. Aranesp 100 mcg today.  6. Metabolic bone disease - Ca 8.7, P 4.6; Hectorol 4 mcg, no binders.  7. Nutrition - Alb 3; renal diet & vitamin.  8. DM Type 2 - diet-controlled     LOS: 3 days   LYLES,CHARLES 10/04/2013,3:53 PM   I have seen and examined patient, discussed with PA and agree with assessment and plan as outlined above. [name] pager [phone]    cell [phone] 10/05/2013, 8:16 AM

## 2013-10-05 ENCOUNTER — Ambulatory Visit (HOSPITAL_COMMUNITY): Payer: Medicare Other

## 2013-10-05 LAB — URINE CULTURE

## 2013-10-05 LAB — COMPREHENSIVE METABOLIC PANEL
ALT: 8 U/L (ref 0–53)
AST: 18 U/L (ref 0–37)
Albumin: 2.7 g/dL — ABNORMAL LOW (ref 3.5–5.2)
Alkaline Phosphatase: 39 U/L (ref 39–117)
BUN: 25 mg/dL — ABNORMAL HIGH (ref 6–23)
CO2: 26 meq/L (ref 19–32)
Calcium: 9.1 mg/dL (ref 8.4–10.5)
Chloride: 95 mEq/L — ABNORMAL LOW (ref 96–112)
Creatinine, Ser: 4.76 mg/dL — ABNORMAL HIGH (ref 0.50–1.35)
GFR calc Af Amer: 12 mL/min — ABNORMAL LOW (ref >90)
GFR, EST NON AFRICAN AMERICAN: 11 mL/min — AB (ref >90)
GLUCOSE: 170 mg/dL — AB (ref 70–99)
Potassium: 3.8 mEq/L (ref 3.7–5.3)
SODIUM: 138 meq/L (ref 137–147)
Total Bilirubin: 0.5 mg/dL (ref 0.3–1.2)
Total Protein: 7 g/dL (ref 6.0–8.3)

## 2013-10-05 LAB — RENAL FUNCTION PANEL
Albumin: 2.7 g/dL — ABNORMAL LOW (ref 3.5–5.2)
BUN: 25 mg/dL — AB (ref 6–23)
CHLORIDE: 95 meq/L — AB (ref 96–112)
CO2: 26 meq/L (ref 19–32)
CREATININE: 4.69 mg/dL — AB (ref 0.50–1.35)
Calcium: 9.2 mg/dL (ref 8.4–10.5)
GFR calc Af Amer: 12 mL/min — ABNORMAL LOW (ref >90)
GFR calc non Af Amer: 11 mL/min — ABNORMAL LOW (ref >90)
GLUCOSE: 170 mg/dL — AB (ref 70–99)
Phosphorus: 4.4 mg/dL (ref 2.3–4.6)
Potassium: 3.8 mEq/L (ref 3.7–5.3)
Sodium: 138 mEq/L (ref 137–147)

## 2013-10-05 LAB — GLUCOSE, CAPILLARY
GLUCOSE-CAPILLARY: 180 mg/dL — AB (ref 70–99)
Glucose-Capillary: 128 mg/dL — ABNORMAL HIGH (ref 70–99)
Glucose-Capillary: 204 mg/dL — ABNORMAL HIGH (ref 70–99)

## 2013-10-05 LAB — CBC
HEMATOCRIT: 26.4 % — AB (ref 39.0–52.0)
HEMOGLOBIN: 9 g/dL — AB (ref 13.0–17.0)
MCH: 34.6 pg — AB (ref 26.0–34.0)
MCHC: 34.1 g/dL (ref 30.0–36.0)
MCV: 101.5 fL — ABNORMAL HIGH (ref 78.0–100.0)
Platelets: 127 10*3/uL — ABNORMAL LOW (ref 150–400)
RBC: 2.6 MIL/uL — AB (ref 4.22–5.81)
RDW: 13.3 % (ref 11.5–15.5)
WBC: 5.2 10*3/uL (ref 4.0–10.5)

## 2013-10-05 MED ORDER — DEXTROSE 5 % IV SOLN
1.0000 g | INTRAVENOUS | Status: AC
  Administered 2013-10-05 – 2013-10-07 (×3): 1 g via INTRAVENOUS
  Filled 2013-10-05 (×4): qty 10

## 2013-10-05 NOTE — Progress Notes (Signed)
Subjective:  Seen on dialysis, no complaints  Objective: Vital signs in last 24 hours: Temp:  [97.6 F (36.4 C)-99 F (37.2 C)] 98.7 F (37.1 C) (01/30 0706) Pulse Rate:  [87-96] 89 (01/30 0900) Resp:  [16-20] 16 (01/30 0706) BP: (106-149)/(49-77) 123/54 mmHg (01/30 0900) SpO2:  [96 %-98 %] 98 % (01/30 0706) Weight:  [91 kg (200 lb 9.9 oz)-91.1 kg (200 lb 13.4 oz)] 91 kg (200 lb 9.9 oz) (01/30 0706) Weight change: -0.6 kg (-1 lb 5.2 oz)  Intake/Output from previous day: 01/29 0701 - 01/30 0700 In: 225 [P.O.:120; IV Piggyback:105] Out: 1500  Intake/Output this shift:   Lab Results:  Recent Labs  10/04/13 0650 10/05/13 0546  WBC 4.7 5.2  HGB 8.9* 9.0*  HCT 26.1* 26.4*  PLT 113* 127*   BMET:  Recent Labs  10/03/13 0709 10/04/13 0650 10/05/13 0546  NA 136* 136* 138  138  K 4.8 4.2 3.8  3.8  CL 98 95* 95*  95*  CO2 22 25 26  26   GLUCOSE 132* 151* 170*  170*  BUN 55* 33* 25*  25*  CREATININE 8.43* 5.76* 4.69*  4.76*  CALCIUM 8.7 8.7 9.2  9.1  ALBUMIN 2.6*  --  2.7*  2.7*   No results found for this basename: PTH,  in the last 72 hours Iron Studies: No results found for this basename: IRON, TIBC, TRANSFERRIN, FERRITIN,  in the last 72 hours  EXAM:  General appearance: Alert, in no apparent distress  Resp: CTA without rales, rhonchi, or wheezes  Cardio: RRR with Gr II/VI systolic murmur, no rub  GI: + BS, soft and nontender  Extremities: No edema  Access: AVG @ [facility] with BFR 450 cc/min   Dialysis Orders: TTS @ [facility]  4 hrs 93.5 kg 2K/2.25Ca 450/A1.5 Heparin 3000 U AVF @ R[facility]  Hectorol 4 mcg Epogen 9000 U Venofer 50 mg on Thurs  Assessment/Plan: 1. AMS - unclear etiology, @ [facility] 1/20 - 24 when valproic acid was dc'd sec to toxicity; possible advanced dementia with progressive white matter disease per MRI 1/22, EEG 1/23 negative, CT 1/27 with no acute process; Neurology following.  2. Seizure disorder - now on Vimpat.  3. ESRD - HD on TTS @ 2/27; K 3.8.  HD  today.  4. Hypertension/volume - BP 123/54 on Amlodipine 10 mg qd; wt 91 kg, still below EDW.  5. Anemia - Hgb 9 on outpatient Epogen & weekly Fe, Aranesp 100 mcg on Thurs.  6. Metabolic bone disease - Ca 9.2 (10.2 corrected), P 4.4; Hectorol 4 mcg, no binders.  7. Nutrition - Alb 2.7; renal diet & vitamin.  8. DM Type 2 - diet-controlled     LOS: 4 days   LYLES,CHARLES 10/05/2013,9:53 AM  I have seen and examined patient, discussed with PA and agree with assessment and plan as outlined above. 10/07/2013 MD pager [phone]    cell [phone] 10/05/2013, 11:34 AM

## 2013-10-05 NOTE — Procedures (Signed)
I was present at this dialysis session, have reviewed the session itself and made  appropriate changes  Rob Khing Belcher MD (pgr) [phone]    (c) [phone] 10/05/2013, 11:21 AM   

## 2013-10-05 NOTE — Progress Notes (Signed)
Pt in Hemodialysis at this time.  Pt unavailable for EEG.

## 2013-10-05 NOTE — Progress Notes (Signed)
PATIENT DETAILS Name: Urho Rio Age: 80 y.o. Sex: male Date of Birth: 1932/03/21 Admit Date: 10/01/2013 Admitting Physician Dewayne Shorter Levora Dredge, MD EQA:[id number],GXQJJH A, MD  Subjective: Seems to be more awake and more alert.  Assessment/Plan: Altered mental status - This seems to be more awake and alert compared to the past few days  - Suspicion for acute metabolic encephalopathy-etiology unknown. No fever, leukocytosis or any other source of infection to suggest that this is from sepsis. However urine cultures positive for Morganella and Escherichia coli both are sensitive to Rocephin, has already completed 4 doses of Rocephin, will continue with Rocephin for 3 more days to complete a seven-day course. Not sure if this questionable UTI is the cause for altered mental status. - Similar presentation last admission very recently, this was attributes it to supratherapeutic Depakote levels. MRI brain and prolonged EEG during that admission was negative. Apparently patient was switched over to Vimpat, made some clinical improvement and then was discharged home. He should return in a few days with recurrence of altered mental status.  - Neurology has been consulted, current plans are to repeat EEG and MRI brain. Possible lumbar puncture depending on clinical course   End-stage renal disease  - On hemodialysis, nephrology following   Anemia  - Suspect are seen hemoglobin secondary to Depakote toxicity, slowly hemoglobin improving. Baseline anemia of chronic disease secondary to end-stage disease.   Thrombocytopenia  - Secondary to recent Depakote toxicity- platelet count  now has normalized. Continue to monitor   History of seizure disorder  - Continue with Vimpat   Hypertension  - Blood pressure controlled  - Continue with amlodipine   Diabetes  - CBGs controlled  - Continue with SSI   Disposition: Remain inpatient  DVT Prophylaxis:  SCD's  Code Status: Full code    Family Communication  son and spouse at bedside   Procedures:   none  CONSULTS:  nephrology and neurology  Time spent 40 minutes-which includes 50% of the time with face-to-face with patient/ family and coordinating care related to the above assessment and plan.    MEDICATIONS: Scheduled Meds: . amLODipine  10 mg Oral Daily  . calcium carbonate  1 tablet Oral TID WC  . darbepoetin  60 mcg Intravenous Q Thu-HD  . doxercalciferol  4 mcg Intravenous Q T,Th,Sa-HD  . ferric gluconate (FERRLECIT/NULECIT) IV  62.5 mg Intravenous Q Thu-HD  . folic acid  1 mg Oral Daily  . insulin aspart  0-9 Units Subcutaneous TID WC  . lacosamide  100 mg Oral BID  . multivitamin  1 tablet Oral QHS  . pantoprazole  40 mg Oral BID   Continuous Infusions:  PRN Meds:.acetaminophen, acetaminophen, LORazepam  Antibiotics: Anti-infectives   Start     Dose/Rate Route Frequency Ordered Stop   10/01/13 2200  cefTRIAXone (ROCEPHIN) 1 g in dextrose 5 % 50 mL IVPB  Status:  Discontinued     1 g 100 mL/hr over 30 Minutes Intravenous Every 24 hours 10/01/13 2040 10/04/13 0941       PHYSICAL EXAM: Vital signs in last 24 hours: Filed Vitals:   10/05/13 1030 10/05/13 1100 10/05/13 1120 10/05/13 1158  BP: 112/59 124/55 122/59 123/50  Pulse: 96 91 91 95  Temp:   98.6 F (37 C) 98.3 F (36.8 C)  TempSrc:   Oral Oral  Resp:   16 14  Height:      Weight:   90 kg (198 lb 6.6  oz)   SpO2:   98% 99%    Weight change: -0.6 kg (-1 lb 5.2 oz) Filed Weights   10/04/13 1105 10/05/13 0706 10/05/13 1120  Weight: 91.1 kg (200 lb 13.4 oz) 91 kg (200 lb 9.9 oz) 90 kg (198 lb 6.6 oz)   Body mass index is 26.18 kg/(m^2).   Gen Exam: Awake and alert with clear speech- still "slow" but much better than yesterday Neck: Supple, No JVD.   Chest: B/L Clear.   CVS: S1 S2 Regular, no murmurs.  Abdomen: soft, BS +, non tender, non distended.  Extremities: no edema, lower extremities warm to touch. Neurologic: Non  Focal.  Skin: No Rash.   Wounds: N/A.   Intake/Output from previous day:  Intake/Output Summary (Last 24 hours) at 10/05/13 1426 Last data filed at 10/05/13 1232  Gross per 24 hour  Intake    225 ml  Output   1128 ml  Net   -903 ml     LAB RESULTS: CBC  Recent Labs Lab 09/29/13 0504 10/01/13 1515 10/03/13 0709 10/04/13 0650 10/05/13 0546  WBC 3.8* 5.9 4.9 4.7 5.2  HGB 10.6* 9.6* 8.1* 8.9* 9.0*  HCT 31.4* 29.0* 23.9* 26.1* 26.4*  PLT 72* 82* 107* 113* 127*  MCV 104.3* 105.5* 100.4* 101.6* 101.5*  MCH 35.2* 34.9* 34.0 34.6* 34.6*  MCHC 33.8 33.1 33.9 34.1 34.1  RDW 13.5 13.9 13.0 13.3 13.3  LYMPHSABS  --  0.9  --   --   --   MONOABS  --  1.1*  --   --   --   EOSABS  --  0.1  --   --   --   BASOSABS  --  0.0  --   --   --     Chemistries   Recent Labs Lab 09/29/13 0504 10/01/13 1515 10/03/13 0709 10/04/13 0650 10/05/13 0546  NA 140 136* 136* 136* 138  138  K 4.9 4.3 4.8 4.2 3.8  3.8  CL 99 97 98 95* 95*  95*  CO2 24 24 22 25 26  26   GLUCOSE 134* 167* 132* 151* 170*  170*  BUN 31* 36* 55* 33* 25*  25*  CREATININE 5.94* 6.65* 8.43* 5.76* 4.69*  4.76*  CALCIUM 9.2 9.2 8.7 8.7 9.2  9.1    CBG:  Recent Labs Lab 10/03/13 1710 10/03/13 2116 10/04/13 1203 10/04/13 1800 10/05/13 1217  GLUCAP 195* 226* 142* 184* 128*    GFR Estimated Creatinine Clearance: 14.4 ml/min (by C-G formula based on Cr of 4.69).  Coagulation profile No results found for this basename: INR, PROTIME,  in the last 168 hours  Cardiac Enzymes No results found for this basename: CK, CKMB, TROPONINI, MYOGLOBIN,  in the last 168 hours  No components found with this basename: POCBNP,  No results found for this basename: DDIMER,  in the last 72 hours No results found for this basename: HGBA1C,  in the last 72 hours No results found for this basename: CHOL, HDL, LDLCALC, TRIG, CHOLHDL, LDLDIRECT,  in the last 72 hours No results found for this basename: TSH, T4TOTAL, FREET3,  T3FREE, THYROIDAB,  in the last 72 hours No results found for this basename: VITAMINB12, FOLATE, FERRITIN, TIBC, IRON, RETICCTPCT,  in the last 72 hours No results found for this basename: LIPASE, AMYLASE,  in the last 72 hours  Urine Studies No results found for this basename: UACOL, UAPR, USPG, UPH, UTP, UGL, UKET, UBIL, UHGB, UNIT, UROB, ULEU, UEPI, UWBC, URBC, UBAC,  CAST, CRYS, UCOM, BILUA,  in the last 72 hours  MICROBIOLOGY: Recent Results (from the past 240 hour(s))  URINE CULTURE     Status: None   Collection Time    10/01/13  3:39 PM      Result Value Range Status   Specimen Description URINE, CATHETERIZED   Final   Special Requests ADDED 0056 10/02/13   Final   Culture  Setup Time     Final   Value: 10/02/2013 12:02     Performed at [facility]   Colony Count     Final   Value: 45,000 COLONIES/ML     Performed at [facility]   Culture     Final   Value: ESCHERICHIA COLI     MORGANELLA MORGANII     Performed at [facility]   Report Status 10/05/2013 FINAL   Final   Organism ID, Bacteria ESCHERICHIA COLI   Final   Organism ID, Bacteria MORGANELLA MORGANII   Final  CULTURE, BLOOD (ROUTINE X 2)     Status: None   Collection Time    10/03/13  4:24 PM      Result Value Range Status   Specimen Description BLOOD LEFT HAND   Final   Special Requests BOTTLES DRAWN AEROBIC ONLY 10CC   Final   Culture  Setup Time     Final   Value: 10/04/2013 00:59     Performed at [facility]   Culture     Final   Value:        BLOOD CULTURE RECEIVED NO GROWTH TO DATE CULTURE WILL BE HELD FOR 5 DAYS BEFORE ISSUING A FINAL NEGATIVE REPORT     Performed at [facility]   Report Status PENDING   Incomplete  CULTURE, BLOOD (ROUTINE X 2)     Status: None   Collection Time    10/03/13  9:20 PM      Result Value Range Status   Specimen Description BLOOD LEFT FOREARM   Final   Special Requests BOTTLES DRAWN AEROBIC ONLY 8CC   Final   Culture  Setup Time      Final   Value: 10/04/2013 00:59     Performed at [facility]   Culture     Final   Value:        BLOOD CULTURE RECEIVED NO GROWTH TO DATE CULTURE WILL BE HELD FOR 5 DAYS BEFORE ISSUING A FINAL NEGATIVE REPORT     Performed at [facility]   Report Status PENDING   Incomplete    RADIOLOGY STUDIES/RESULTS: Dg Chest 2 View  09/25/2013   CLINICAL DATA:  Altered mental status.  EXAM: CHEST  2 VIEW  COMPARISON:  01/24/2013  FINDINGS: Two views of the chest were obtained. Mild fullness in the right peritracheal region is unchanged. Heart size is normal. No evidence for airspace disease or edema. No evidence for pleural effusions.  IMPRESSION: No acute chest findings.   Electronically Signed   By: [name] M.D.   On: 09/25/2013 21:37   Ct Head Wo Contrast  10/01/2013   CLINICAL DATA:  Intermittent confusion and poor communication, 2 episodes of urinary incontinence.  EXAM: CT HEAD WITHOUT CONTRAST  TECHNIQUE: Contiguous axial images were obtained from the base of the skull through the vertex without intravenous contrast.  COMPARISON:  MRI of the brain September 27, 2013. CT of the head October 03, 2013  FINDINGS: The ventricles and sulci are normal for  age. No intraparenchymal hemorrhage, mass effect nor midline shift. Confluent supratentorial white matter hypodensities are within normal range for patient's age and though non-specific suggest sequelae of chronic small vessel ischemic disease. Bilateral basal ganglia mineralization. No acute large vascular territory infarcts.  No abnormal extra-axial fluid collections. Basal cisterns are patent. Moderate calcific atherosclerosis of the carotid siphons.  No skull fracture. Small bony excrescence from the inner table of the left high frontal calvarium could reflect a meningioma without mass effect. Mild maxillary mucosal thickening with small air-fluid level on the right. Mastoid air cells are well aerated. The included ocular globes and  orbital contents are non-suspicious. Status post bilateral ocular lens implants.  IMPRESSION: No acute intracranial process.  Involutional changes. Severe white matter changes, similar to prior examination in though nonspecific may reflect chronic small vessel ischemic disease.  Mild apparent acute on chronic maxillary status.   Electronically Signed   By: Awilda Metro   On: 10/01/2013 22:11   Ct Head Wo Contrast  09/25/2013   CLINICAL DATA:  Altered level of consciousness. Altered mental status.  EXAM: CT HEAD WITHOUT CONTRAST  TECHNIQUE: Contiguous axial images were obtained from the base of the skull through the vertex without intravenous contrast.  COMPARISON:  01/15/2013  FINDINGS: There is no evidence of intracranial hemorrhage, brain edema, or other signs of acute infarction. There is no evidence of intracranial mass lesion or mass effect. No abnormal extraaxial fluid collections are identified.  Mild cerebral atrophy is stable. Extensive chronic small vessel disease is also unchanged in appearance. Ventricles are stable in size. No evidence of skull fracture or other significant bone abnormality.  IMPRESSION: No acute intracranial findings.  Stable chronic small vessel disease and mild cerebral atrophy.   Electronically Signed   By: Myles Rosenthal M.D.   On: 09/25/2013 20:04   Mr Brain Wo Contrast  09/27/2013   CLINICAL DATA:  Altered mental status.  EXAM: MRI HEAD WITHOUT CONTRAST  TECHNIQUE: Multiplanar, multiecho pulse sequences of the brain and surrounding structures were obtained without intravenous contrast.  COMPARISON:  Head CT 09/25/2013 and brain MRI 01/16/2013  FINDINGS: Images are mildly to moderately degraded by motion artifact.  Again seen are multiple punctate foci of susceptibility artifact within both cerebral hemispheres, greatest in the occipital lobes and posterior left temporal lobe. These are have slightly increased in number from the prior study and are compatible with remote  microhemorrhages. There is no acute infarct. Confluent T2 hyperintensity within the subcortical and deep cerebral white matter bilaterally has increased significantly from the prior MRI, particularly in the frontal lobes. There is moderate cerebral atrophy, also increased from the prior MRI. There is no evidence of mass, midline shift, or extra-axial fluid collection.  Major intracranial vascular flow voids are unremarkable. Prior bilateral cataract surgery is noted. Mild bilateral ethmoid and maxillary sinus mucosal thickening is noted. Mastoid air cells are clear. Limited visualization of the upper cervical spine demonstrates moderate multilevel spondylosis.  IMPRESSION: 1. No evidence of acute infarct. 2. Significant interval progression of white matter disease from prior MRI. While this may represent progression of chronic microvascular ischemia, other etiologies, such as metabolic and infectious encephalopathies (including HIV), are not excluded. 3. Mild interval progression of cerebral atrophy and bilateral cerebral microhemorrhages.   Electronically Signed   By: Sebastian Ache   On: 09/27/2013 19:53   Dg Chest Portable 1 View  10/01/2013   CLINICAL DATA:  Incontinence.  Confusion.  Weakness.  EXAM: PORTABLE CHEST - 1 VIEW  COMPARISON:  09/25/2013  FINDINGS: Right paratracheal densities attributable to ectatic vasculature. Heart size within normal limits. Low lung volumes are present, causing crowding of the pulmonary vasculature.  The lungs appear clear.  IMPRESSION: No acute thoracic findings.   Electronically Signed   By: Herbie Baltimore M.D.   On: 10/01/2013 18:25    Jeoffrey Massed, MD  [facility] Pager:[phone]  If 7PM-7AM, please contact night-coverage [email or web address] Password [email or web address] 10/05/2013, 2:26 PM   LOS: 4 days

## 2013-10-05 NOTE — Progress Notes (Signed)
Condom cath changed at 1210.  Pt voided while cath was off

## 2013-10-06 ENCOUNTER — Inpatient Hospital Stay (HOSPITAL_COMMUNITY): Payer: Medicare Other

## 2013-10-06 ENCOUNTER — Encounter (HOSPITAL_COMMUNITY): Payer: Self-pay | Admitting: Nephrology

## 2013-10-06 DIAGNOSIS — N390 Urinary tract infection, site not specified: Secondary | ICD-10-CM

## 2013-10-06 LAB — GLUCOSE, CAPILLARY
GLUCOSE-CAPILLARY: 163 mg/dL — AB (ref 70–99)
GLUCOSE-CAPILLARY: 164 mg/dL — AB (ref 70–99)
Glucose-Capillary: 169 mg/dL — ABNORMAL HIGH (ref 70–99)

## 2013-10-06 MED ORDER — BIOTENE DRY MOUTH MT LIQD
15.0000 mL | Freq: Two times a day (BID) | OROMUCOSAL | Status: DC
Start: 2013-10-06 — End: 2013-10-09
  Administered 2013-10-07 – 2013-10-09 (×5): 15 mL via OROMUCOSAL

## 2013-10-06 NOTE — Procedures (Signed)
EEG report:   Brief clinical history: 80 y/o with progressive decline in mental status and apparently more acute onset of inability to walk.  Technique: this is a 17 channel routine scalp EEG performed at the bedside with bipolar and monopolar montages arranged in accordance to the international 10/20 system of electrode placement. One channel was dedicated to EKG recording.  The study was performed during drowsiness and stage 2 sleep. No activating procedures performed.  Description: No wakefulness was achieved during this recording, and thus assessment of the basic background rhythm is not feasible. Otherwise, drowsiness and sleep demonstrated normal architecture.  No focal or generalized epileptiform discharges noted.  EKG showed sinus rhythm.  Impression: this is a normal drowsy and asleep EEG. Please, be aware that a normal EEG does not exclude the possibility of epilepsy.  Clinical correlation is advised.  Wyatt Portela, MD

## 2013-10-06 NOTE — Progress Notes (Signed)
EEG Completed; Results Pending  

## 2013-10-06 NOTE — Progress Notes (Addendum)
  [facility] Progress Note   Subjective: Very slow to respond, eats when staff feed him. According to HD staff I spoke with today pt 's baseline as of several weeks ago was walking into HD unit with a walker fully oriented  Filed Vitals:   10/05/13 1120 10/05/13 1158 10/05/13 2038 10/06/13 0531  BP: 122/59 123/50 123/58 128/74  Pulse: 91 95 88 96  Temp: 98.6 F (37 C) 98.3 F (36.8 C)    TempSrc: Oral Oral    Resp: 16 14 16 16   Height:      Weight: 90 kg (198 lb 6.6 oz)     SpO2: 98% 99% 96% 96%  Exam Withdrawn, very slow to respond, slow motor movements No jvd Chest clear bilat RRR no MRG ABd soft, nt/nd Neuro w diffuse gen weakness, slow to respond  Dialysis: T[facility] @ [facility]  4 hrs 93.5 kg 2K/2.25Ca 450/A1.5 Heparin 3000 U AVF @ [facility]  Hectorol 4 mcg Epogen 9000 U Venofer 50 mg on Thurs  Assessment: 1 Altered MS, new onset, +progressive white matter disease on MRI 2 ESRD on HD, below dry wt 3 HTN 4 Seizure d/o, on Vimpat 5 Anemia 6 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 7 DM2 8 UTI Ecoli + Morganella, on Rocephin  Plan- Short HD today to get back on schedule, updated pt's son, unclear cause of rapid neuro decline, will d/w neurology as well   MD pager [phone]    cell [phone] 10/06/2013, 11:03 AM   Recent Labs Lab 10/03/13 0709 10/04/13 0650 10/05/13 0546  NA 136* 136* 138  138  K 4.8 4.2 3.8  3.8  CL 98 95* 95*  95*  CO2 22 25 26  26   GLUCOSE 132* 151* 170*  170*  BUN 55* 33* 25*  25*  CREATININE 8.43* 5.76* 4.69*  4.76*  CALCIUM 8.7 8.7 9.2  9.1  PHOS 5.1*  --  4.4    Recent Labs Lab 10/01/13 1515 10/03/13 0709 10/05/13 0546  AST 13  --  18  ALT 5  --  8  ALKPHOS 42  --  39  BILITOT 0.4  --  0.5  PROT 6.9  --  7.0  ALBUMIN 3.0* 2.6* 2.7*  2.7*    Recent Labs Lab 10/01/13 1515 10/03/13 0709 10/04/13 0650 10/05/13 0546  WBC 5.9 4.9 4.7 5.2  NEUTROABS 3.9  --   --   --   HGB 9.6* 8.1* 8.9* 9.0*  HCT 29.0* 23.9* 26.1*  26.4*  MCV 105.5* 100.4* 101.6* 101.5*  PLT 82* 107* 113* 127*   . amLODipine  10 mg Oral Daily  . calcium carbonate  1 tablet Oral TID WC  . cefTRIAXone (ROCEPHIN)  IV  1 g Intravenous Q24H  . darbepoetin  60 mcg Intravenous Q Thu-HD  . doxercalciferol  4 mcg Intravenous Q T,Th,Sa-HD  . ferric gluconate (FERRLECIT/NULECIT) IV  62.5 mg Intravenous Q Thu-HD  . folic acid  1 mg Oral Daily  . insulin aspart  0-9 Units Subcutaneous TID WC  . lacosamide  100 mg Oral BID  . multivitamin  1 tablet Oral QHS  . pantoprazole  40 mg Oral BID     acetaminophen, acetaminophen, LORazepam

## 2013-10-06 NOTE — Progress Notes (Signed)
PATIENT DETAILS Name: [name] Age: 80 y.o. Sex: male Date of Birth: 02/21/1932 Admit Date: 10/01/2013 Admitting Physician Dewayne Shorter Levora Dredge, MD PJA:[id number] A, MD  Subjective: Seems to be more awake and more alert-but still very slow  Assessment/Plan: Altered mental status - This seems to be more awake and alert compared to the past few days  - Suspicion for acute metabolic encephalopathy-etiology unknown. No fever, leukocytosis or any other source of infection to suggest that this is from sepsis. However urine cultures positive for Morganella and Escherichia coli both are sensitive to Rocephin, will continue with Rocephin to complete a seven-day course. Not sure if this questionable UTI is the cause for altered mental status. - Similar presentation last admission very recently, this was attributes it to supratherapeutic Depakote levels. MRI brain and prolonged EEG during that admission was negative. Apparently patient was switched over to Vimpat, made some clinical improvement and then was discharged home. He should return in a few days with recurrence of altered mental status.  - Neurology has been consulted, current plans are to repeat EEG and MRI brain. Possible lumbar puncture depending on clinical course   End-stage renal disease  - On hemodialysis, nephrology following   Anemia  - Suspect are seen hemoglobin secondary to Depakote toxicity, slowly hemoglobin improving. Baseline anemia of chronic disease secondary to end-stage disease.   Thrombocytopenia  - Secondary to recent Depakote toxicity- platelet count  now has normalized. Continue to monitor   History of seizure disorder  - Continue with Vimpat   Hypertension  - Blood pressure controlled  - Continue with amlodipine   Diabetes  - CBGs controlled  - Continue with SSI   Disposition: Remain inpatient  DVT Prophylaxis:  SCD's  Code Status: Full code   Family Communication  son and  spouse at bedside on 1/30  Procedures:   none  CONSULTS:  nephrology and neurology  MEDICATIONS: Scheduled Meds: . amLODipine  10 mg Oral Daily  . calcium carbonate  1 tablet Oral TID WC  . cefTRIAXone (ROCEPHIN)  IV  1 g Intravenous Q24H  . darbepoetin  60 mcg Intravenous Q Thu-HD  . doxercalciferol  4 mcg Intravenous Q T,Th,Sa-HD  . ferric gluconate (FERRLECIT/NULECIT) IV  62.5 mg Intravenous Q Thu-HD  . folic acid  1 mg Oral Daily  . insulin aspart  0-9 Units Subcutaneous TID WC  . lacosamide  100 mg Oral BID  . multivitamin  1 tablet Oral QHS  . pantoprazole  40 mg Oral BID   Continuous Infusions:  PRN Meds:.acetaminophen, acetaminophen, LORazepam  Antibiotics: Anti-infectives   Start     Dose/Rate Route Frequency Ordered Stop   10/05/13 1500  cefTRIAXone (ROCEPHIN) 1 g in dextrose 5 % 50 mL IVPB     1 g 100 mL/hr over 30 Minutes Intravenous Every 24 hours 10/05/13 1433 10/08/13 1459   10/01/13 2200  cefTRIAXone (ROCEPHIN) 1 g in dextrose 5 % 50 mL IVPB  Status:  Discontinued     1 g 100 mL/hr over 30 Minutes Intravenous Every 24 hours 10/01/13 2040 10/04/13 0941       PHYSICAL EXAM: Vital signs in last 24 hours: Filed Vitals:   10/05/13 1120 10/05/13 1158 10/05/13 2038 10/06/13 0531  BP: 122/59 123/50 123/58 128/74  Pulse: 91 95 88 96  Temp: 98.6 F (37 C) 98.3 F (36.8 C)    TempSrc: Oral Oral    Resp: 16 14 16 16   Height:  Weight: 90 kg (198 lb 6.6 oz)     SpO2: 98% 99% 96% 96%    Weight change: -1.6 kg (-3 lb 8.4 oz) Filed Weights   10/04/13 1105 10/05/13 0706 10/05/13 1120  Weight: 91.1 kg (200 lb 13.4 oz) 91 kg (200 lb 9.9 oz) 90 kg (198 lb 6.6 oz)   Body mass index is 26.18 kg/(m^2).   Gen Exam: Awake and alert with clear speech- still "slow" Neck: Supple, No JVD.   Chest: B/L Clear.  No rales or rhonchi CVS: S1 S2 Regular, no murmurs.  Abdomen: soft, BS +, non tender, non distended.  Extremities: no edema, lower extremities warm to  touch. Neurologic: Non Focal.  Skin: No Rash.   Wounds: N/A.   Intake/Output from previous day:  Intake/Output Summary (Last 24 hours) at 10/06/13 0954 Last data filed at 10/06/13 0531  Gross per 24 hour  Intake    830 ml  Output   1178 ml  Net   -348 ml     LAB RESULTS: CBC  Recent Labs Lab 10/01/13 1515 10/03/13 0709 10/04/13 0650 10/05/13 0546  WBC 5.9 4.9 4.7 5.2  HGB 9.6* 8.1* 8.9* 9.0*  HCT 29.0* 23.9* 26.1* 26.4*  PLT 82* 107* 113* 127*  MCV 105.5* 100.4* 101.6* 101.5*  MCH 34.9* 34.0 34.6* 34.6*  MCHC 33.1 33.9 34.1 34.1  RDW 13.9 13.0 13.3 13.3  LYMPHSABS 0.9  --   --   --   MONOABS 1.1*  --   --   --   EOSABS 0.1  --   --   --   BASOSABS 0.0  --   --   --     Chemistries   Recent Labs Lab 10/01/13 1515 10/03/13 0709 10/04/13 0650 10/05/13 0546  NA 136* 136* 136* 138  138  K 4.3 4.8 4.2 3.8  3.8  CL 97 98 95* 95*  95*  CO2 24 22 25 26  26   GLUCOSE 167* 132* 151* 170*  170*  BUN 36* 55* 33* 25*  25*  CREATININE 6.65* 8.43* 5.76* 4.69*  4.76*  CALCIUM 9.2 8.7 8.7 9.2  9.1    CBG:  Recent Labs Lab 10/04/13 1800 10/05/13 1217 10/05/13 1700 10/05/13 2159 10/06/13 0730  GLUCAP 184* 128* 204* 180* 163*    GFR Estimated Creatinine Clearance: 14.4 ml/min (by C-G formula based on Cr of 4.69).  Coagulation profile No results found for this basename: INR, PROTIME,  in the last 168 hours  Cardiac Enzymes No results found for this basename: CK, CKMB, TROPONINI, MYOGLOBIN,  in the last 168 hours  No components found with this basename: POCBNP,  No results found for this basename: DDIMER,  in the last 72 hours No results found for this basename: HGBA1C,  in the last 72 hours No results found for this basename: CHOL, HDL, LDLCALC, TRIG, CHOLHDL, LDLDIRECT,  in the last 72 hours No results found for this basename: TSH, T4TOTAL, FREET3, T3FREE, THYROIDAB,  in the last 72 hours No results found for this basename: VITAMINB12, FOLATE,  FERRITIN, TIBC, IRON, RETICCTPCT,  in the last 72 hours No results found for this basename: LIPASE, AMYLASE,  in the last 72 hours  Urine Studies No results found for this basename: UACOL, UAPR, USPG, UPH, UTP, UGL, UKET, UBIL, UHGB, UNIT, UROB, ULEU, UEPI, UWBC, URBC, UBAC, CAST, CRYS, UCOM, BILUA,  in the last 72 hours  MICROBIOLOGY: Recent Results (from the past 240 hour(s))  URINE CULTURE  Status: None   Collection Time    10/01/13  3:39 PM      Result Value Range Status   Specimen Description URINE, CATHETERIZED   Final   Special Requests ADDED 0056 10/02/13   Final   Culture  Setup Time     Final   Value: 10/02/2013 12:02     Performed at [facility]   Colony Count     Final   Value: 45,000 COLONIES/ML     Performed at [facility]   Culture     Final   Value: ESCHERICHIA COLI     MORGANELLA MORGANII     Performed at [facility]   Report Status 10/05/2013 FINAL   Final   Organism ID, Bacteria ESCHERICHIA COLI   Final   Organism ID, Bacteria MORGANELLA MORGANII   Final  CULTURE, BLOOD (ROUTINE X 2)     Status: None   Collection Time    10/03/13  4:24 PM      Result Value Range Status   Specimen Description BLOOD LEFT HAND   Final   Special Requests BOTTLES DRAWN AEROBIC ONLY 10CC   Final   Culture  Setup Time     Final   Value: 10/04/2013 00:59     Performed at [facility]   Culture     Final   Value:        BLOOD CULTURE RECEIVED NO GROWTH TO DATE CULTURE WILL BE HELD FOR 5 DAYS BEFORE ISSUING A FINAL NEGATIVE REPORT     Performed at [facility]   Report Status PENDING   Incomplete  CULTURE, BLOOD (ROUTINE X 2)     Status: None   Collection Time    10/03/13  9:20 PM      Result Value Range Status   Specimen Description BLOOD LEFT FOREARM   Final   Special Requests BOTTLES DRAWN AEROBIC ONLY 8CC   Final   Culture  Setup Time     Final   Value: 10/04/2013 00:59     Performed at [facility]   Culture     Final     Value:        BLOOD CULTURE RECEIVED NO GROWTH TO DATE CULTURE WILL BE HELD FOR 5 DAYS BEFORE ISSUING A FINAL NEGATIVE REPORT     Performed at [facility]   Report Status PENDING   Incomplete    RADIOLOGY STUDIES/RESULTS: Dg Chest 2 View  09/25/2013   CLINICAL DATA:  Altered mental status.  EXAM: CHEST  2 VIEW  COMPARISON:  01/24/2013  FINDINGS: Two views of the chest were obtained. Mild fullness in the right peritracheal region is unchanged. Heart size is normal. No evidence for airspace disease or edema. No evidence for pleural effusions.  IMPRESSION: No acute chest findings.   Electronically Signed   By: [name] M.D.   On: 09/25/2013 21:37   Ct Head Wo Contrast  10/01/2013   CLINICAL DATA:  Intermittent confusion and poor communication, 2 episodes of urinary incontinence.  EXAM: CT HEAD WITHOUT CONTRAST  TECHNIQUE: Contiguous axial images were obtained from the base of the skull through the vertex without intravenous contrast.  COMPARISON:  MRI of the brain September 27, 2013. CT of the head October 03, 2013  FINDINGS: The ventricles and sulci are normal for age. No intraparenchymal hemorrhage, mass effect nor midline shift. Confluent supratentorial white matter hypodensities are within normal range for patient's age and though non-specific suggest  sequelae of chronic small vessel ischemic disease. Bilateral basal ganglia mineralization. No acute large vascular territory infarcts.  No abnormal extra-axial fluid collections. Basal cisterns are patent. Moderate calcific atherosclerosis of the carotid siphons.  No skull fracture. Small bony excrescence from the inner table of the left high frontal calvarium could reflect a meningioma without mass effect. Mild maxillary mucosal thickening with small air-fluid level on the right. Mastoid air cells are well aerated. The included ocular globes and orbital contents are non-suspicious. Status post bilateral ocular lens implants.  IMPRESSION: No  acute intracranial process.  Involutional changes. Severe white matter changes, similar to prior examination in though nonspecific may reflect chronic small vessel ischemic disease.  Mild apparent acute on chronic maxillary status.   Electronically Signed   By: Awilda Metro   On: 10/01/2013 22:11   Ct Head Wo Contrast  09/25/2013   CLINICAL DATA:  Altered level of consciousness. Altered mental status.  EXAM: CT HEAD WITHOUT CONTRAST  TECHNIQUE: Contiguous axial images were obtained from the base of the skull through the vertex without intravenous contrast.  COMPARISON:  01/15/2013  FINDINGS: There is no evidence of intracranial hemorrhage, brain edema, or other signs of acute infarction. There is no evidence of intracranial mass lesion or mass effect. No abnormal extraaxial fluid collections are identified.  Mild cerebral atrophy is stable. Extensive chronic small vessel disease is also unchanged in appearance. Ventricles are stable in size. No evidence of skull fracture or other significant bone abnormality.  IMPRESSION: No acute intracranial findings.  Stable chronic small vessel disease and mild cerebral atrophy.   Electronically Signed   By: Myles Rosenthal M.D.   On: 09/25/2013 20:04   Mr Brain Wo Contrast  09/27/2013   CLINICAL DATA:  Altered mental status.  EXAM: MRI HEAD WITHOUT CONTRAST  TECHNIQUE: Multiplanar, multiecho pulse sequences of the brain and surrounding structures were obtained without intravenous contrast.  COMPARISON:  Head CT 09/25/2013 and brain MRI 01/16/2013  FINDINGS: Images are mildly to moderately degraded by motion artifact.  Again seen are multiple punctate foci of susceptibility artifact within both cerebral hemispheres, greatest in the occipital lobes and posterior left temporal lobe. These are have slightly increased in number from the prior study and are compatible with remote microhemorrhages. There is no acute infarct. Confluent T2 hyperintensity within the subcortical  and deep cerebral white matter bilaterally has increased significantly from the prior MRI, particularly in the frontal lobes. There is moderate cerebral atrophy, also increased from the prior MRI. There is no evidence of mass, midline shift, or extra-axial fluid collection.  Major intracranial vascular flow voids are unremarkable. Prior bilateral cataract surgery is noted. Mild bilateral ethmoid and maxillary sinus mucosal thickening is noted. Mastoid air cells are clear. Limited visualization of the upper cervical spine demonstrates moderate multilevel spondylosis.  IMPRESSION: 1. No evidence of acute infarct. 2. Significant interval progression of white matter disease from prior MRI. While this may represent progression of chronic microvascular ischemia, other etiologies, such as metabolic and infectious encephalopathies (including HIV), are not excluded. 3. Mild interval progression of cerebral atrophy and bilateral cerebral microhemorrhages.   Electronically Signed   By: Sebastian Ache   On: 09/27/2013 19:53   Dg Chest Portable 1 View  10/01/2013   CLINICAL DATA:  Incontinence.  Confusion.  Weakness.  EXAM: PORTABLE CHEST - 1 VIEW  COMPARISON:  09/25/2013  FINDINGS: Right paratracheal densities attributable to ectatic vasculature. Heart size within normal limits. Low lung volumes are present, causing crowding  of the pulmonary vasculature.  The lungs appear clear.  IMPRESSION: No acute thoracic findings.   Electronically Signed   By: Herbie Baltimore M.D.   On: 10/01/2013 18:25    Jeoffrey Massed, MD  [facility] Pager:[phone]  If 7PM-7AM, please contact night-coverage [email or web address] Password [email or web address] 10/06/2013, 9:54 AM   LOS: 5 days

## 2013-10-07 LAB — GLUCOSE, CAPILLARY
GLUCOSE-CAPILLARY: 149 mg/dL — AB (ref 70–99)
GLUCOSE-CAPILLARY: 142 mg/dL — AB (ref 70–99)
GLUCOSE-CAPILLARY: 173 mg/dL — AB (ref 70–99)
GLUCOSE-CAPILLARY: 154 mg/dL — AB (ref 70–99)
Glucose-Capillary: 201 mg/dL — ABNORMAL HIGH (ref 70–99)

## 2013-10-07 LAB — CBC
HCT: 25.4 % — ABNORMAL LOW (ref 39.0–52.0)
Hemoglobin: 8.5 g/dL — ABNORMAL LOW (ref 13.0–17.0)
MCH: 33.9 pg (ref 26.0–34.0)
MCHC: 33.5 g/dL (ref 30.0–36.0)
MCV: 101.2 fL — ABNORMAL HIGH (ref 78.0–100.0)
PLATELETS: 149 10*3/uL — AB (ref 150–400)
RBC: 2.51 MIL/uL — ABNORMAL LOW (ref 4.22–5.81)
RDW: 13.3 % (ref 11.5–15.5)
WBC: 5.6 10*3/uL (ref 4.0–10.5)

## 2013-10-07 LAB — BASIC METABOLIC PANEL
BUN: 40 mg/dL — ABNORMAL HIGH (ref 6–23)
CO2: 24 mEq/L (ref 19–32)
Calcium: 9.3 mg/dL (ref 8.4–10.5)
Chloride: 91 mEq/L — ABNORMAL LOW (ref 96–112)
Creatinine, Ser: 6.29 mg/dL — ABNORMAL HIGH (ref 0.50–1.35)
GFR, EST AFRICAN AMERICAN: 9 mL/min — AB (ref >90)
GFR, EST NON AFRICAN AMERICAN: 8 mL/min — AB (ref >90)
Glucose, Bld: 147 mg/dL — ABNORMAL HIGH (ref 70–99)
POTASSIUM: 4.2 meq/L (ref 3.7–5.3)
Sodium: 134 mEq/L — ABNORMAL LOW (ref 137–147)

## 2013-10-07 MED ORDER — NEPRO/CARBSTEADY PO LIQD
237.0000 mL | Freq: Two times a day (BID) | ORAL | Status: DC
  Administered 2013-10-07 – 2013-10-09 (×4): 237 mL via ORAL

## 2013-10-07 MED ORDER — DOXERCALCIFEROL 4 MCG/2ML IV SOLN
3.0000 ug | INTRAVENOUS | Status: DC
  Filled 2013-10-07: qty 2

## 2013-10-07 NOTE — Progress Notes (Signed)
PATIENT DETAILS Name: [name] Age: 80 y.o. Sex: male Date of Birth: 1932-08-07 Admit Date: 10/01/2013 Admitting Physician Dewayne ShorterShanker Levora DredgeM Tamari Busic, MD EAV:[id number],NFAOZHPCP:GOLDSBOROUGH,KELLIE A, MD  Subjective: Essentially unchanged for the past 48 hours. Awake, very slow but alert.  Assessment/Plan: Altered mental status - Essentially unchanged, somewhat more awake and alert than on admission. - Suspicion for acute metabolic encephalopathy-etiology unknown. No fever, leukocytosis or any other source of infection to suggest that this is from sepsis. However urine cultures positive for Morganella and Escherichia coli both are sensitive to Rocephin, will continue with Rocephin to complete a seven-day course. Not sure if this questionable UTI is the cause for altered mental status. - Similar presentation last admission very recently, this was attributed it to supratherapeutic Depakote levels. MRI brain and prolonged EEG during that admission was negative. Apparently patient was switched over to Vimpat, made some clinical improvement and then was discharged home. He  unfortunately returned return in a few days with recurrence of altered mental status and was very admitted. - Neurology has been consulted,  repeat EEG and MRI brain did not show any major abnormalities.. Possible lumbar puncture depending on clinical course. Will discuss with neurology today.  End-stage renal disease  - On hemodialysis, nephrology following   Anemia  - Suspect are seen hemoglobin secondary to Depakote toxicity, slowly hemoglobin improving. Baseline anemia of chronic disease secondary to end-stage disease.   Thrombocytopenia  - Secondary to recent Depakote toxicity- platelet count  now has normalized. Continue to monitor   History of seizure disorder  - Continue with Vimpat   Hypertension  - Blood pressure controlled  - Continue with amlodipine   Diabetes  - CBGs controlled  - Continue with SSI    Disposition: Remain inpatient- suspect will need SNF on discharge  DVT Prophylaxis:  SCD's  Code Status: Full code   Family Communication  son and spouse at bedside on 1/30  Procedures:   none  CONSULTS:  nephrology and neurology  MEDICATIONS: Scheduled Meds: . amLODipine  10 mg Oral Daily  . antiseptic oral rinse  15 mL Mouth Rinse BID  . calcium carbonate  1 tablet Oral TID WC  . cefTRIAXone (ROCEPHIN)  IV  1 g Intravenous Q24H  . darbepoetin  60 mcg Intravenous Q Thu-HD  . doxercalciferol  4 mcg Intravenous Q T,Th,Sa-HD  . ferric gluconate (FERRLECIT/NULECIT) IV  62.5 mg Intravenous Q Thu-HD  . folic acid  1 mg Oral Daily  . insulin aspart  0-9 Units Subcutaneous TID WC  . lacosamide  100 mg Oral BID  . multivitamin  1 tablet Oral QHS  . pantoprazole  40 mg Oral BID   Continuous Infusions:  PRN Meds:.acetaminophen, acetaminophen, LORazepam  Antibiotics: Anti-infectives   Start     Dose/Rate Route Frequency Ordered Stop   10/05/13 1500  cefTRIAXone (ROCEPHIN) 1 g in dextrose 5 % 50 mL IVPB     1 g 100 mL/hr over 30 Minutes Intravenous Every 24 hours 10/05/13 1433 10/08/13 1459   10/01/13 2200  cefTRIAXone (ROCEPHIN) 1 g in dextrose 5 % 50 mL IVPB  Status:  Discontinued     1 g 100 mL/hr over 30 Minutes Intravenous Every 24 hours 10/01/13 2040 10/04/13 0941       PHYSICAL EXAM: Vital signs in last 24 hours: Filed Vitals:   10/06/13 0531 10/06/13 1300 10/06/13 2212 10/07/13 0631  BP: 128/74 111/66 144/72 132/62  Pulse: 96 84 82 85  Temp:  99.3 F (37.4 C) 98.6 F (37 C) 98.4 F (36.9 C)  TempSrc:   Oral Oral  Resp: 16  16 16   Height:      Weight:    87.4 kg (192 lb 10.9 oz)  SpO2: 96% 96% 97% 98%    Weight change: -3.6 kg (-7 lb 15 oz) Filed Weights   10/05/13 0706 10/05/13 1120 10/07/13 0631  Weight: 91 kg (200 lb 9.9 oz) 90 kg (198 lb 6.6 oz) 87.4 kg (192 lb 10.9 oz)   Body mass index is 25.43 kg/(m^2).   Gen Exam: Awake and alert with  clear speech- still "slow"- essentially unchanged over the past few days Neck: Supple, No JVD.   Chest: B/L Clear.  No rales or rhonchi CVS: S1 S2 Regular, no murmurs.  Abdomen: soft, BS +, non tender, non distended.  Extremities: no edema, lower extremities warm to touch. Neurologic: Non Focal.  Skin: No Rash.   Wounds: N/A.   Intake/Output from previous day:  Intake/Output Summary (Last 24 hours) at 10/07/13 0959 Last data filed at 10/07/13 0900  Gross per 24 hour  Intake    770 ml  Output    500 ml  Net    270 ml     LAB RESULTS: CBC  Recent Labs Lab 10/01/13 1515 10/03/13 0709 10/04/13 0650 10/05/13 0546 10/07/13 0615  WBC 5.9 4.9 4.7 5.2 5.6  HGB 9.6* 8.1* 8.9* 9.0* 8.5*  HCT 29.0* 23.9* 26.1* 26.4* 25.4*  PLT 82* 107* 113* 127* 149*  MCV 105.5* 100.4* 101.6* 101.5* 101.2*  MCH 34.9* 34.0 34.6* 34.6* 33.9  MCHC 33.1 33.9 34.1 34.1 33.5  RDW 13.9 13.0 13.3 13.3 13.3  LYMPHSABS 0.9  --   --   --   --   MONOABS 1.1*  --   --   --   --   EOSABS 0.1  --   --   --   --   BASOSABS 0.0  --   --   --   --     Chemistries   Recent Labs Lab 10/01/13 1515 10/03/13 0709 10/04/13 0650 10/05/13 0546 10/07/13 0615  NA 136* 136* 136* 138  138 134*  K 4.3 4.8 4.2 3.8  3.8 4.2  CL 97 98 95* 95*  95* 91*  CO2 24 22 25 26  26 24   GLUCOSE 167* 132* 151* 170*  170* 147*  BUN 36* 55* 33* 25*  25* 40*  CREATININE 6.65* 8.43* 5.76* 4.69*  4.76* 6.29*  CALCIUM 9.2 8.7 8.7 9.2  9.1 9.3    CBG:  Recent Labs Lab 10/06/13 0730 10/06/13 1211 10/06/13 1859 10/06/13 2210 10/07/13 0812  GLUCAP 163* 164* 149* 169* 142*    GFR Estimated Creatinine Clearance: 10.8 ml/min (by C-G formula based on Cr of 6.29).  Coagulation profile No results found for this basename: INR, PROTIME,  in the last 168 hours  Cardiac Enzymes No results found for this basename: CK, CKMB, TROPONINI, MYOGLOBIN,  in the last 168 hours  No components found with this basename: POCBNP,   No results found for this basename: DDIMER,  in the last 72 hours No results found for this basename: HGBA1C,  in the last 72 hours No results found for this basename: CHOL, HDL, LDLCALC, TRIG, CHOLHDL, LDLDIRECT,  in the last 72 hours No results found for this basename: TSH, T4TOTAL, FREET3, T3FREE, THYROIDAB,  in the last 72 hours No results found for this basename: VITAMINB12, FOLATE, FERRITIN, TIBC, IRON,  RETICCTPCT,  in the last 72 hours No results found for this basename: LIPASE, AMYLASE,  in the last 72 hours  Urine Studies No results found for this basename: UACOL, UAPR, USPG, UPH, UTP, UGL, UKET, UBIL, UHGB, UNIT, UROB, ULEU, UEPI, UWBC, URBC, UBAC, CAST, CRYS, UCOM, BILUA,  in the last 72 hours  MICROBIOLOGY: Recent Results (from the past 240 hour(s))  URINE CULTURE     Status: None   Collection Time    10/01/13  3:39 PM      Result Value Range Status   Specimen Description URINE, CATHETERIZED   Final   Special Requests ADDED 0056 10/02/13   Final   Culture  Setup Time     Final   Value: 10/02/2013 12:02     Performed at [facility]   Colony Count     Final   Value: 45,000 COLONIES/ML     Performed at [facility]   Culture     Final   Value: ESCHERICHIA COLI     MORGANELLA MORGANII     Performed at [facility]   Report Status 10/05/2013 FINAL   Final   Organism ID, Bacteria ESCHERICHIA COLI   Final   Organism ID, Bacteria MORGANELLA MORGANII   Final  CULTURE, BLOOD (ROUTINE X 2)     Status: None   Collection Time    10/03/13  4:24 PM      Result Value Range Status   Specimen Description BLOOD LEFT HAND   Final   Special Requests BOTTLES DRAWN AEROBIC ONLY 10CC   Final   Culture  Setup Time     Final   Value: 10/04/2013 00:59     Performed at [facility]   Culture     Final   Value:        BLOOD CULTURE RECEIVED NO GROWTH TO DATE CULTURE WILL BE HELD FOR 5 DAYS BEFORE ISSUING A FINAL NEGATIVE REPORT     Performed at [facility]   Report Status PENDING   Incomplete  CULTURE, BLOOD (ROUTINE X 2)     Status: None   Collection Time    10/03/13  9:20 PM      Result Value Range Status   Specimen Description BLOOD LEFT FOREARM   Final   Special Requests BOTTLES DRAWN AEROBIC ONLY 8CC   Final   Culture  Setup Time     Final   Value: 10/04/2013 00:59     Performed at [facility]   Culture     Final   Value:        BLOOD CULTURE RECEIVED NO GROWTH TO DATE CULTURE WILL BE HELD FOR 5 DAYS BEFORE ISSUING A FINAL NEGATIVE REPORT     Performed at [facility]   Report Status PENDING   Incomplete    RADIOLOGY STUDIES/RESULTS: Dg Chest 2 View  09/25/2013   CLINICAL DATA:  Altered mental status.  EXAM: CHEST  2 VIEW  COMPARISON:  01/24/2013  FINDINGS: Two views of the chest were obtained. Mild fullness in the right peritracheal region is unchanged. Heart size is normal. No evidence for airspace disease or edema. No evidence for pleural effusions.  IMPRESSION: No acute chest findings.   Electronically Signed   By: Richarda Overlie M.D.   On: 09/25/2013 21:37   Ct Head Wo Contrast  10/01/2013   CLINICAL DATA:  Intermittent confusion and poor communication, 2 episodes of urinary incontinence.  EXAM: CT HEAD WITHOUT  CONTRAST  TECHNIQUE: Contiguous axial images were obtained from the base of the skull through the vertex without intravenous contrast.  COMPARISON:  MRI of the brain September 27, 2013. CT of the head October 03, 2013  FINDINGS: The ventricles and sulci are normal for age. No intraparenchymal hemorrhage, mass effect nor midline shift. Confluent supratentorial white matter hypodensities are within normal range for patient's age and though non-specific suggest sequelae of chronic small vessel ischemic disease. Bilateral basal ganglia mineralization. No acute large vascular territory infarcts.  No abnormal extra-axial fluid collections. Basal cisterns are patent. Moderate calcific atherosclerosis of the carotid  siphons.  No skull fracture. Small bony excrescence from the inner table of the left high frontal calvarium could reflect a meningioma without mass effect. Mild maxillary mucosal thickening with small air-fluid level on the right. Mastoid air cells are well aerated. The included ocular globes and orbital contents are non-suspicious. Status post bilateral ocular lens implants.  IMPRESSION: No acute intracranial process.  Involutional changes. Severe white matter changes, similar to prior examination in though nonspecific may reflect chronic small vessel ischemic disease.  Mild apparent acute on chronic maxillary status.   Electronically Signed   By: Awilda Metro   On: 10/01/2013 22:11   Ct Head Wo Contrast  09/25/2013   CLINICAL DATA:  Altered level of consciousness. Altered mental status.  EXAM: CT HEAD WITHOUT CONTRAST  TECHNIQUE: Contiguous axial images were obtained from the base of the skull through the vertex without intravenous contrast.  COMPARISON:  01/15/2013  FINDINGS: There is no evidence of intracranial hemorrhage, brain edema, or other signs of acute infarction. There is no evidence of intracranial mass lesion or mass effect. No abnormal extraaxial fluid collections are identified.  Mild cerebral atrophy is stable. Extensive chronic small vessel disease is also unchanged in appearance. Ventricles are stable in size. No evidence of skull fracture or other significant bone abnormality.  IMPRESSION: No acute intracranial findings.  Stable chronic small vessel disease and mild cerebral atrophy.   Electronically Signed   By: Myles Rosenthal M.D.   On: 09/25/2013 20:04   Mr Brain Wo Contrast  09/27/2013   CLINICAL DATA:  Altered mental status.  EXAM: MRI HEAD WITHOUT CONTRAST  TECHNIQUE: Multiplanar, multiecho pulse sequences of the brain and surrounding structures were obtained without intravenous contrast.  COMPARISON:  Head CT 09/25/2013 and brain MRI 01/16/2013  FINDINGS: Images are mildly to  moderately degraded by motion artifact.  Again seen are multiple punctate foci of susceptibility artifact within both cerebral hemispheres, greatest in the occipital lobes and posterior left temporal lobe. These are have slightly increased in number from the prior study and are compatible with remote microhemorrhages. There is no acute infarct. Confluent T2 hyperintensity within the subcortical and deep cerebral white matter bilaterally has increased significantly from the prior MRI, particularly in the frontal lobes. There is moderate cerebral atrophy, also increased from the prior MRI. There is no evidence of mass, midline shift, or extra-axial fluid collection.  Major intracranial vascular flow voids are unremarkable. Prior bilateral cataract surgery is noted. Mild bilateral ethmoid and maxillary sinus mucosal thickening is noted. Mastoid air cells are clear. Limited visualization of the upper cervical spine demonstrates moderate multilevel spondylosis.  IMPRESSION: 1. No evidence of acute infarct. 2. Significant interval progression of white matter disease from prior MRI. While this may represent progression of chronic microvascular ischemia, other etiologies, such as metabolic and infectious encephalopathies (including HIV), are not excluded. 3. Mild interval progression of cerebral  atrophy and bilateral cerebral microhemorrhages.   Electronically Signed   By: Sebastian Ache   On: 09/27/2013 19:53   Dg Chest Portable 1 View  10/01/2013   CLINICAL DATA:  Incontinence.  Confusion.  Weakness.  EXAM: PORTABLE CHEST - 1 VIEW  COMPARISON:  09/25/2013  FINDINGS: Right paratracheal densities attributable to ectatic vasculature. Heart size within normal limits. Low lung volumes are present, causing crowding of the pulmonary vasculature.  The lungs appear clear.  IMPRESSION: No acute thoracic findings.   Electronically Signed   By: Herbie Baltimore M.D.   On: 10/01/2013 18:25    Jeoffrey Massed, MD  [facility] Pager:[phone]  If 7PM-7AM, please contact night-coverage [email or web address] Password [email or web address] 10/07/2013, 9:59 AM   LOS: 6 days

## 2013-10-07 NOTE — Progress Notes (Signed)
NEURO HOSPITALIST PROGRESS NOTE   SUBJECTIVE:                                                                                                                        He seems to be very drowsy, open his eyes and follows simple commands inconsistently, and falls back to sleep. Family is not available at this moment. EEG unremarkable. MRI brain showed no acute abnormality but extensive, prominent " hyperintensity throughout the  periventricular and deep white matter throughout both cerebral  hemispheres. This is stable since 09/27/2013 but has progressed since 01/16/2013. The basal ganglia and brainstem are intact". He is on vimpat 100 mg BID, started at the time of his last admission to the hospital. Serologies significant for Cr 6.29, BUN 40, Na 134, Hgb 8.5.  1/26: B12 884; TSH; RPR non reactive; Ammonia 20; HIV antibody negative  OBJECTIVE:                                                                                                                           Vital signs in last 24 hours: Temp:  [98.4 F (36.9 C)-98.9 F (37.2 C)] 98.9 F (37.2 C) (02/01 1341) Pulse Rate:  [82-85] 83 (02/01 1341) Resp:  [16] 16 (02/01 1341) BP: (126-144)/(62-72) 126/68 mmHg (02/01 1341) SpO2:  [95 %-98 %] 95 % (02/01 1341) Weight:  [87.4 kg (192 lb 10.9 oz)] 87.4 kg (192 lb 10.9 oz) (02/01 0631)  Intake/Output from previous day: 01/31 0701 - 02/01 0700 In: 530 [P.O.:480; IV Piggyback:50] Out: 500 [Urine:500] Intake/Output this shift: Total I/O In: 240 [P.O.:240] Out: -  Nutritional status: Renal  Past Medical History  Diagnosis Date  . Hypertension   . Diabetes mellitus without complication   . Arthritis   . History of blood transfusion   . GI bleed   . ESRD (end stage renal disease)   . Seizure     Neurologic Exam:  Mental Status:  Drowsy, wakes up from sleep, inconsistently follows simple commands for a very limited amount of time, but then  goes back to sleep. Speech/language difficult to evaluate due to fluctuating mental status. Cranial Nerves:  II: Discs flat bilaterally; Visual fields grossly normal, pupils equal, round, reactive to light  III,IV, VI: ptosis not present, extra-ocular motions intact bilaterally  V,VII: smile symmetric, facial light touch sensation normal bilaterally  VIII: hearing normal bilaterally  IX,X: gag reflex present  XI: bilateral shoulder shrug no tested XII: midline tongue extension without atrophy or fasciculations  Motor:  Moves all limbs symmetrically.  Tone and bulk:normal tone throughout; no atrophy noted  Sensory: no tested  Deep Tendon Reflexes:  1+ all over  Plantars:  Right: downgoing Left: downgoing  Cerebellar:  No tested.  Gait:  Unable to test.   Lab Results: No results found for this basename: cbc, bmp, coags, chol, tri, ldl, hga1c   Lipid Panel No results found for this basename: CHOL, TRIG, HDL, CHOLHDL, VLDL, LDLCALC,  in the last 72 hours  Studies/Results: Mr Brain Wo Contrast  10/06/2013   ADDENDUM REPORT: 10/06/2013 19:52  ADDENDUM: Disc degeneration and spondylosis in the cervical spine, incompletely evaluated on the study   Electronically Signed   By: Marlan Palau M.D.   On: 10/06/2013 19:52   10/06/2013   CLINICAL DATA:  Altered mental status  EXAM: MRI HEAD WITHOUT CONTRAST  TECHNIQUE: Multiplanar, multiecho pulse sequences of the brain and surrounding structures were obtained without intravenous contrast.  COMPARISON:  MRI 09/27/2013  FINDINGS: Negative for acute infarct.  Moderate atrophy. Advanced hyperintensity throughout the periventricular and deep white matter throughout both cerebral hemispheres. This is stable since 09/27/2013 but has progressed since 01/16/2013. The basal ganglia and brainstem are intact.  Numerous areas of chronic micro hemorrhage bilaterally especially in the occipital lobes, stable. Question chronic hypertension.  Negative for mass or  edema.  No shift of the midline structures.  Mild mucosal edema in the paranasal sinuses.  IMPRESSION: Advanced chronic changes throughout cerebral white matter bilaterally are stable since the recent study. This may be due to chronic microvascular ischemia. Chemotherapy and radiation and other toxins could also cause this appearance.  No acute infarct.  Electronically Signed: By: Marlan Palau M.D. On: 10/06/2013 19:00    MEDICATIONS                                                                                                                       I have reviewed the patient's current medications.  ASSESSMENT/PLAN:                                                                                                           80 y/o with ESRD on dialysis , readmitted to the hospital due to altered mental status with rather acute/subacute decline in sensorium  and motor skills. EEG during this admission unrevealing. MRI brain with prominent, extensive subcortical white matter involvement that seems to be progressing since last year but  stable in comparison to MRI 09/26/13. 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 A late presentation of a hereditary leukodystrophy should be in the differential (adult onset adrenoleukodystrophy or metachromatic leukoencephalopathy) but seem to be unlikely possibilities.  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 Underlying metabolic issues from ESRD can also be a contributing factor for his current encephalopathic picture. Vimpat was recently started and this can sometimes cause acute cognitive deterioration as a side effect, thus will suggest holding vimpat for few days. I don't see compelling reasons  to pursue LP at this moment, as I don't think we are dealing with an infectious leukoencephalopathy. Will follow up with you.  Wyatt Portela, MD [facility] [phone]  10/07/2013, 1:42 PM

## 2013-10-07 NOTE — Progress Notes (Signed)
[facility] Progress Note  Subjective:   C/o pain in lower legs  Objective Filed Vitals:   10/06/13 0531 10/06/13 1300 10/06/13 2212 10/07/13 0631  BP: 128/74 111/66 144/72 132/62  Pulse: 96 84 82 85  Temp:  99.3 F (37.4 C) 98.6 F (37 C) 98.4 F (36.9 C)  TempSrc:   Oral Oral  Resp: 16  16 16   Height:      Weight:    87.4 kg (192 lb 10.9 oz)  SpO2: 96% 96% 97% 98%   Physical Exam General: debilitated slow to respond Heart: RRR Lungs: no wheezes or rales Abdomen: active BS soft Extremities: no sig edema; SCDs in place; arthritic knees Neuro: oriented to [facility]; could not state year or president stiff UE's, psychomotor retardation, responds but very slowly Dialysis Access: right upper AVF + bruit  D[facility]: [facility]  4 hrs 93.5 kg 2K/2.25Ca 450/A1.5 Heparin 3000 U AVF @ RUA  Hectorol 4 mcg Epogen 9000 U Venofer 50 mg on Thurs   Assessment/Plan: 1. AMS - new onset - MRI showing progressive disease; EEG normal drowsy and sleep; Nuero consulted - seems to wax and wane a little but a marked decline in a short period 2. ESRD - TTS - off schedule - had HD Th and Friday - will dialyze Monday and then can get back on TTS schedule 3. Anemia - Hgb 8.5 +/- Aranesp 60/ IV Fe 4. Secondary hyperparathyroidism - corrected Ca 10.8 will lower hectorol to 3 and use 2 Ca bath; these will need to be changed at d/c 5. HTN/volume - UF 988 Friday, 1.5 Thurs - post weight Friday 90, Thursday 91.1 6. Nutrition - high protein renal diet = vit 7. E Coli/Morganella UTI - on rocephin 8. Seizure d/o - on Vimpat  Monday, PA-C [facility] Beeper [phone] 10/07/2013,11:57 AM  LOS: 6 days   I have seen and examined patient, discussed with PA and agree with assessment and plan as outlined above with additions as indicated.  ESRD pt with psychomotor slowing and cognitive decline acutely, unclear cause. Vimpat is new, have spoken with neurology about  possibly holding Vimpat for a few days, he will reassess today.   12/05/2013 MD pager [phone]    cell [phone] 10/07/2013, 12:50 PM   Additional Objective Labs: Basic Metabolic Panel:  Recent Labs Lab 10/03/13 0709 10/04/13 0650 10/05/13 0546 10/07/13 0615  NA 136* 136* 138  138 134*  K 4.8 4.2 3.8  3.8 4.2  CL 98 95* 95*  95* 91*  CO2 22 25 26  26 24   GLUCOSE 132* 151* 170*  170* 147*  BUN 55* 33* 25*  25* 40*  CREATININE 8.43* 5.76* 4.69*  4.76* 6.29*  CALCIUM 8.7 8.7 9.2  9.1 9.3  PHOS 5.1*  --  4.4  --    Liver Function Tests:  Recent Labs Lab 10/01/13 1515 10/03/13 0709 10/05/13 0546  AST 13  --  18  ALT 5  --  8  ALKPHOS 42  --  39  BILITOT 0.4  --  0.5  PROT 6.9  --  7.0  ALBUMIN 3.0* 2.6* 2.7*  2.7*   CBC:  Recent Labs Lab 10/01/13 1515 10/03/13 0709 10/04/13 0650 10/05/13 0546 10/07/13 0615  WBC 5.9 4.9 4.7 5.2 5.6  NEUTROABS 3.9  --   --   --   --   HGB 9.6* 8.1* 8.9* 9.0* 8.5*  HCT 29.0* 23.9* 26.1* 26.4* 25.4*  MCV 105.5* 100.4* 101.6* 101.5* 101.2*  PLT 82* 107* 113* 127* 149*   Blood Culture    Component Value Date/Time   SDES BLOOD LEFT FOREARM 10/03/2013 2120   SPECREQUEST BOTTLES DRAWN AEROBIC ONLY 8CC 10/03/2013 2120   CULT  Value:        BLOOD CULTURE RECEIVED NO GROWTH TO DATE CULTURE WILL BE HELD FOR 5 DAYS BEFORE ISSUING A FINAL NEGATIVE REPORT Performed at [facility] 10/03/2013 2120   REPTSTATUS PENDING 10/03/2013 2120  CBG:  Recent Labs Lab 10/06/13 0730 10/06/13 1211 10/06/13 1859 10/06/13 2210 10/07/13 0812  GLUCAP 163* 164* 149* 169* 142*  Studies/Results: Mr Brain Wo Contrast  10/06/2013   ADDENDUM REPORT: 10/06/2013 19:52  ADDENDUM: Disc degeneration and spondylosis in the cervical spine, incompletely evaluated on the study   Electronically Signed   By: Marlan Palau M.D.   On: 10/06/2013 19:52   10/06/2013   CLINICAL DATA:  Altered mental status  EXAM: MRI HEAD WITHOUT CONTRAST  TECHNIQUE:  Multiplanar, multiecho pulse sequences of the brain and surrounding structures were obtained without intravenous contrast.  COMPARISON:  MRI 09/27/2013  FINDINGS: Negative for acute infarct.  Moderate atrophy. Advanced hyperintensity throughout the periventricular and deep white matter throughout both cerebral hemispheres. This is stable since 09/27/2013 but has progressed since 01/16/2013. The basal ganglia and brainstem are intact.  Numerous areas of chronic micro hemorrhage bilaterally especially in the occipital lobes, stable. Question chronic hypertension.  Negative for mass or edema.  No shift of the midline structures.  Mild mucosal edema in the paranasal sinuses.  IMPRESSION: Advanced chronic changes throughout cerebral white matter bilaterally are stable since the recent study. This may be due to chronic microvascular ischemia. Chemotherapy and radiation and other toxins could also cause this appearance.  No acute infarct.  Electronically Signed: By: Marlan Palau M.D. On: 10/06/2013 19:00   Medications:   . amLODipine  10 mg Oral Daily  . antiseptic oral rinse  15 mL Mouth Rinse BID  . calcium carbonate  1 tablet Oral TID WC  . cefTRIAXone (ROCEPHIN)  IV  1 g Intravenous Q24H  . darbepoetin  60 mcg Intravenous Q Thu-HD  . doxercalciferol  4 mcg Intravenous Q T,Th,Sa-HD  . ferric gluconate (FERRLECIT/NULECIT) IV  62.5 mg Intravenous Q Thu-HD  . folic acid  1 mg Oral Daily  . insulin aspart  0-9 Units Subcutaneous TID WC  . lacosamide  100 mg Oral BID  . multivitamin  1 tablet Oral QHS  . pantoprazole  40 mg Oral BID

## 2013-10-08 LAB — CBC
HEMATOCRIT: 23 % — AB (ref 39.0–52.0)
Hemoglobin: 7.9 g/dL — ABNORMAL LOW (ref 13.0–17.0)
MCH: 33.9 pg (ref 26.0–34.0)
MCHC: 34.3 g/dL (ref 30.0–36.0)
MCV: 98.7 fL (ref 78.0–100.0)
Platelets: 153 10*3/uL (ref 150–400)
RBC: 2.33 MIL/uL — ABNORMAL LOW (ref 4.22–5.81)
RDW: 13.4 % (ref 11.5–15.5)
WBC: 3.1 10*3/uL — ABNORMAL LOW (ref 4.0–10.5)

## 2013-10-08 LAB — GLUCOSE, CAPILLARY
Glucose-Capillary: 139 mg/dL — ABNORMAL HIGH (ref 70–99)
Glucose-Capillary: 145 mg/dL — ABNORMAL HIGH (ref 70–99)
Glucose-Capillary: 150 mg/dL — ABNORMAL HIGH (ref 70–99)

## 2013-10-08 LAB — RENAL FUNCTION PANEL
Albumin: 2.5 g/dL — ABNORMAL LOW (ref 3.5–5.2)
BUN: 46 mg/dL — AB (ref 6–23)
CO2: 25 mEq/L (ref 19–32)
CREATININE: 6.16 mg/dL — AB (ref 0.50–1.35)
Calcium: 8.7 mg/dL (ref 8.4–10.5)
Chloride: 93 mEq/L — ABNORMAL LOW (ref 96–112)
GFR calc Af Amer: 9 mL/min — ABNORMAL LOW (ref >90)
GFR calc non Af Amer: 8 mL/min — ABNORMAL LOW (ref >90)
Glucose, Bld: 188 mg/dL — ABNORMAL HIGH (ref 70–99)
PHOSPHORUS: 3.8 mg/dL (ref 2.3–4.6)
Potassium: 4.1 mEq/L (ref 3.7–5.3)
Sodium: 135 mEq/L — ABNORMAL LOW (ref 137–147)

## 2013-10-08 MED ORDER — ALTEPLASE 2 MG IJ SOLR: 2.0000 mg | Freq: Once | INTRAMUSCULAR | Status: DC | PRN

## 2013-10-08 MED ORDER — LIDOCAINE-PRILOCAINE 2.5-2.5 % EX CREA: 1.0000 "application " | TOPICAL_CREAM | CUTANEOUS | Status: DC | PRN

## 2013-10-08 MED ORDER — LIDOCAINE-PRILOCAINE 2.5-2.5 % EX CREA
1.0000 | TOPICAL_CREAM | CUTANEOUS | Status: DC | PRN
Start: 2013-10-08 — End: 2013-10-09

## 2013-10-08 MED ORDER — PENTAFLUOROPROP-TETRAFLUOROETH EX AERO: 1.0000 "application " | INHALATION_SPRAY | CUTANEOUS | Status: DC | PRN

## 2013-10-08 MED ORDER — ALTEPLASE 2 MG IJ SOLR: 2.0000 mg | Freq: Once | INTRAMUSCULAR | Status: AC | PRN

## 2013-10-08 MED ORDER — SODIUM CHLORIDE 0.9 % IV SOLN: 100.0000 mL | INTRAVENOUS | Status: DC | PRN

## 2013-10-08 MED ORDER — DARBEPOETIN ALFA-POLYSORBATE 60 MCG/0.3ML IJ SOLN: 200.0000 ug | INTRAMUSCULAR | Status: DC

## 2013-10-08 MED ORDER — DOXERCALCIFEROL 4 MCG/2ML IV SOLN
INTRAVENOUS | Status: AC
  Administered 2013-10-08: 4 ug via INTRAVENOUS
  Filled 2013-10-08: qty 2

## 2013-10-08 MED ORDER — LIDOCAINE HCL (PF) 1 % IJ SOLN: 5.0000 mL | INTRAMUSCULAR | Status: DC | PRN

## 2013-10-08 MED ORDER — HEPARIN SODIUM (PORCINE) 1000 UNIT/ML DIALYSIS: 1000.0000 [IU] | INTRAMUSCULAR | Status: DC | PRN

## 2013-10-08 MED ORDER — HEPARIN SODIUM (PORCINE) 1000 UNIT/ML DIALYSIS
20.0000 [IU]/kg | INTRAMUSCULAR | Status: DC | PRN
Start: 2013-10-08 — End: 2013-10-09
  Administered 2013-10-08: 1700 [IU] via INTRAVENOUS_CENTRAL

## 2013-10-08 MED ORDER — NEPRO/CARBSTEADY PO LIQD: 237.0000 mL | ORAL | Status: DC | PRN

## 2013-10-08 MED ORDER — HEPARIN SODIUM (PORCINE) 1000 UNIT/ML DIALYSIS: 2000.0000 [IU] | Freq: Once | INTRAMUSCULAR | Status: DC

## 2013-10-08 NOTE — Progress Notes (Signed)
Physical Therapy Treatment Patient Details Name: [name] MRN: [id number] DOB: Dec 27, 1931 Today's Date: 10/08/2013 Time: 8841-6606 PT Time Calculation (min): 13 min  PT Assessment / Plan / Recommendation  History of Present Illness 80 year old male with history of end-stage renal disease on dialysis (tuesdays, Thursdays and Saturdays), seizure disorder, diet controlled diabetes, anemia of chronic kidney disease who was recently admitted and discharged 2 days back after being evaluated for altered mental status. Family had noticed significant changes in his personality. He was admitted to the hospital and worked up for altered mental status with negative workup for CVA and seizures. His valproate was discontinued as patient was developing pancytopenia.   PT Comments   Pt much more alert today. Still requiring significant assist and with very poor mobility.  Continues to need SNF.  Follow Up Recommendations  SNF     Does the patient have the potential to tolerate intense rehabilitation     Barriers to Discharge        Equipment Recommendations  None recommended by PT    Recommendations for Other Services    Frequency Min 2X/week   Progress towards PT Goals Progress towards PT goals: Progressing toward goals  Plan Current plan remains appropriate    Precautions / Restrictions Precautions Precautions: Fall   Pertinent Vitals/Pain No c/o's    Mobility  Bed Mobility Sit to supine: +2 for physical assistance;Total assist General bed mobility comments: Assist to bring legs up on bed and lower trunk. Transfers Equipment used: Rolling walker (2 wheeled);None (Stedy) Sit to Stand: +2 physical assistance;Max assist Stand pivot transfers: +2 physical assistance;Max assist General transfer comment: On first attempt stood with rolling walker but unable to perform pivotal steps.  Returned to sitting and used Stedy on the next attempt. Able to pivot pt back to bed with the Stedy.     Exercises     PT Diagnosis:    PT Problem List:   PT Treatment Interventions:     PT Goals (current goals can now be found in the care plan section)    Visit Information  Last PT Received On: 10/08/13 Assistance Needed: +2 History of Present Illness: 80 year old male with history of end-stage renal disease on dialysis (tuesdays, Thursdays and Saturdays), seizure disorder, diet controlled diabetes, anemia of chronic kidney disease who was recently admitted and discharged 2 days back after being evaluated for altered mental status. Family had noticed significant changes in his personality. He was admitted to the hospital and worked up for altered mental status with negative workup for CVA and seizures. His valproate was discontinued as patient was developing pancytopenia.    Subjective Data      Cognition  Cognition Arousal/Alertness: Awake/alert Behavior During Therapy: Flat affect Memory: Decreased short-term memory Following Commands: Follows one step commands consistently;Follows one step commands with increased time Problem Solving: Slow processing;Decreased initiation;Requires verbal cues;Requires tactile cues    Balance  Balance Sitting-balance support: Bilateral upper extremity supported Sitting balance-Leahy Scale: Fair Standing balance support: Bilateral upper extremity supported Standing balance-Leahy Scale: Zero Standing balance comment: Pt with flexed posture and posterior lean.  End of Session PT - End of Session Equipment Utilized During Treatment: Gait belt;Other (comment) Antony Salmon) Activity Tolerance: Patient limited by fatigue Patient left: in bed;Other (comment) (being taken to HD) Nurse Communication: Mobility status;Need for lift equipment   GP     Nyomi Howser 10/08/2013, 2:47 PM  [facility] PT [phone]

## 2013-10-08 NOTE — Progress Notes (Signed)
Neuro MD please call patient's son Kodey Xue) after am rounds at [phone].  thanks

## 2013-10-08 NOTE — Progress Notes (Signed)
I have seen and examined this patient and agree with the plan of care plan dialysis in AM [facility] W 10/08/2013, 10:19 AM

## 2013-10-08 NOTE — Progress Notes (Signed)
Subjective:   No complaints, more talkative today  Objective: Vital signs in last 24 hours: Temp:  [98.3 F (36.8 C)-99.1 F (37.3 C)] 98.3 F (36.8 C) (02/02 0634) Pulse Rate:  [77-84] 84 (02/02 0634) Resp:  [16] 16 (02/02 0634) BP: (126-155)/(66-68) 155/68 mmHg (02/02 0634) SpO2:  [94 %-99 %] 99 % (02/02 0634) Weight:  [87.4 kg (192 lb 10.9 oz)] 87.4 kg (192 lb 10.9 oz) (02/02 0634) Weight change: 0 kg (0 lb)  Intake/Output from previous day: 02/01 0701 - 02/02 0700 In: 840 [P.O.:840] Out: 450 [Urine:450]   Lab Results:  Recent Labs  10/07/13 0615  WBC 5.6  HGB 8.5*  HCT 25.4*  PLT 149*   BMET:  Recent Labs  10/07/13 0615  NA 134*  K 4.2  CL 91*  CO2 24  GLUCOSE 147*  BUN 40*  CREATININE 6.29*  CALCIUM 9.3   No results found for this basename: PTH,  in the last 72 hours Iron Studies: No results found for this basename: IRON, TIBC, TRANSFERRIN, FERRITIN,  in the last 72 hours  EXAM:  General appearance: Alert, in no apparent distress  Resp: CTA without rales, rhonchi, or wheezes  Cardio: RRR with Gr II/VI systolic murmur, no rub  GI: + BS, soft and nontender  Extremities: No edema  Access: AVG @ RUA with + bruit  Dialysis Orders: T[facility] @ [facility]  4 hrs 93.5 kg 2K/2.25Ca 450/A1.5 Heparin 3000 U AVF @ RUA  Hectorol 4 mcg Epogen 9000 U Venofer 50 mg on Thurs  Assessment/Plan: 1. AMS - unclear etiology, more talkative today, possible advanced dementia with progressive white matter disease per MRI 1/22, EEG 1/23 negative, CT 1/27 with no acute process; Neurology following.  2. Seizure disorder - Depakote switched to Vimpat after last hospitalization, now on hold to determine effect.  3. UTI - + for E coli, Morganella; on Rocephin. 4. ESRD - HD on TTS @ [place]; K 4.2, off schedule, last HD on 1/30. HD pending today.  5. Hypertension/volume - BP 155/68 on Amlodipine 10 mg qd; wt 87.4 kg, well below EDW.  6. Anemia - Hgb down to 8.5, weekly Fe, Aranesp 100 mcg on  Thurs. Increase to 200 mcg. 7. Metabolic bone disease - Ca 9.3 (10.3 corrected), P 5.1; Hectorol 4 mcg, no binders.  8. Nutrition - Alb 2.7; renal diet & vitamin.  9. DM Type 2 - diet-controlled     LOS: 7 days   [name] 10/08/2013,9:49 AM

## 2013-10-08 NOTE — Clinical Social Work Placement (Signed)
Clinical Social Work Department CLINICAL SOCIAL WORK PLACEMENT NOTE 10/08/2013  Patient:  [facility]  Account Number:  [id number] Admit date:  10/01/2013  Clinical Social Worker:  Cherre Blanc, [place]  Date/time:  10/03/2013 03:30 PM  Clinical Social Work is seeking post-discharge placement for this patient at the following level of care:   SKILLED NURSING   (*CSW will update this form in Epic as items are completed)   10/03/2013  Patient/family provided with [facility] Department of Clinical Social Work's list of facilities offering this level of care within the geographic area requested by the patient (or if unable, by the patient's family).  10/03/2013  Patient/family informed of their freedom to choose among providers that offer the needed level of care, that participate in Medicare, Medicaid or managed care program needed by the patient, have an available bed and are willing to accept the patient.  10/03/2013  Patient/family informed of [facility]' ownership interest in [facility], as well as of the fact that they are under no obligation to receive care at this facility.  PASARR submitted to EDS on 10/03/2013 PASARR number received from [employer] on 10/03/2013  FL2 transmitted to all facilities in geographic area requested by pt/family on  10/03/2013 FL2 transmitted to all facilities within larger geographic area on   Patient informed that his/her managed care company has contracts with or will negotiate with  certain facilities, including the following:     Patient/family informed of bed offers received:  10/04/2013 Patient chooses bed at [facility] Physician recommends and patient chooses bed at    Patient to be transferred to [facility] on   Patient to be transferred to facility by   The following physician request were entered in Epic:   Additional Comments:   Roddie Mc, [place], [place], [phone]

## 2013-10-08 NOTE — Progress Notes (Addendum)
PATIENT DETAILS Name: Zannie Lopardo Age: 80 y.o. Sex: male Date of Birth: Mar 09, 1932 Admit Date: 10/01/2013 Admitting Physician Dewayne Shorter Levora Dredge, MD GNF:[id number],NGEXBM A, MD  Subjective: Essentially unchanged. Awake, very slow but alert.  Assessment/Plan: Altered mental status - Essentially unchanged, somewhat more awake and alert than on admission. - Suspicion for acute metabolic encephalopathy-etiology unknown. No fever, leukocytosis or any other source of infection to suggest that this is from sepsis. However urine cultures positive for Morganella and Escherichia coli both are sensitive to Rocephin, has completed a seven-day course with Rocephin. Not sure if this questionable UTI is the cause for altered mental status. - Similar presentation last admission very recently, this was attributed it to supratherapeutic Depakote levels. MRI brain and prolonged EEG during that admission was negative. Apparently patient was switched over to Vimpat, made some clinical improvement and then was discharged home. He  unfortunately returned return in a few days with recurrence of altered mental status and was very admitted. - Neurology has been consulted,  repeat EEG and MRI brain did not show any major abnormalities. Per neurology, no role for lumbar puncture, they recommended to hold Vimpat-of Vimpat since 2/1  End-stage renal disease  - On hemodialysis, nephrology following   Anemia  - Suspect are seen hemoglobin secondary to Depakote toxicity, slowly hemoglobin improving. Baseline anemia of chronic disease secondary to end-stage disease.   Thrombocytopenia  - Secondary to recent Depakote toxicity- platelet count  now has normalized. Continue to monitor   History of seizure disorder  - Continue with Vimpat   Hypertension  - Blood pressure controlled  - Continue with amlodipine   Diabetes  - CBGs controlled  - Continue with SSI   Disposition: Remain inpatient- suspect will  need SNF on discharge- suspect will be ready on 10/09/13  DVT Prophylaxis:  SCD's  Code Status: Full code   Family Communication  son and spouse 2/1  Procedures:   none  CONSULTS:  nephrology and neurology  MEDICATIONS: Scheduled Meds: . amLODipine  10 mg Oral Daily  . antiseptic oral rinse  15 mL Mouth Rinse BID  . calcium carbonate  1 tablet Oral TID WC  . [START ON 10/11/2013] darbepoetin  200 mcg Intravenous Q Thu-HD  . [START ON 10/09/2013] doxercalciferol  3 mcg Intravenous Q T,Th,Sa-HD  . feeding supplement (NEPRO CARB STEADY)  237 mL Oral BID BM  . ferric gluconate (FERRLECIT/NULECIT) IV  62.5 mg Intravenous Q Thu-HD  . folic acid  1 mg Oral Daily  . insulin aspart  0-9 Units Subcutaneous TID WC  . multivitamin  1 tablet Oral QHS  . pantoprazole  40 mg Oral BID   Continuous Infusions:  PRN Meds:.acetaminophen, acetaminophen, LORazepam  Antibiotics: Anti-infectives   Start     Dose/Rate Route Frequency Ordered Stop   10/05/13 1500  cefTRIAXone (ROCEPHIN) 1 g in dextrose 5 % 50 mL IVPB     1 g 100 mL/hr over 30 Minutes Intravenous Every 24 hours 10/05/13 1433 10/07/13 1840   10/01/13 2200  cefTRIAXone (ROCEPHIN) 1 g in dextrose 5 % 50 mL IVPB  Status:  Discontinued     1 g 100 mL/hr over 30 Minutes Intravenous Every 24 hours 10/01/13 2040 10/04/13 0941       PHYSICAL EXAM: Vital signs in last 24 hours: Filed Vitals:   10/07/13 0631 10/07/13 1341 10/07/13 2124 10/08/13 0634  BP: 132/62 126/68 132/66 155/68  Pulse: 85 83 77 84  Temp: 98.4  F (36.9 C) 98.9 F (37.2 C) 99.1 F (37.3 C) 98.3 F (36.8 C)  TempSrc: Oral Axillary Oral Oral  Resp: 16 16 16 16   Height:      Weight: 87.4 kg (192 lb 10.9 oz)   87.4 kg (192 lb 10.9 oz)  SpO2: 98% 95% 94% 99%    Weight change: 0 kg (0 lb) Filed Weights   10/05/13 1120 10/07/13 0631 10/08/13 0634  Weight: 90 kg (198 lb 6.6 oz) 87.4 kg (192 lb 10.9 oz) 87.4 kg (192 lb 10.9 oz)   Body mass index is 25.43  kg/(m^2).   Gen Exam: Awake and alert with clear speech- still "slow"- essentially unchanged over the past few days Neck: Supple, No JVD.   Chest: B/L Clear.  No rales or rhonchi CVS: S1 S2 Regular, no murmurs.  Abdomen: soft, BS +, non tender, non distended.  Extremities: no edema, lower extremities warm to touch. Neurologic: Non Focal.  Skin: No Rash.   Wounds: N/A.   Intake/Output from previous day:  Intake/Output Summary (Last 24 hours) at 10/08/13 1138 Last data filed at 10/08/13 0636  Gross per 24 hour  Intake    600 ml  Output    450 ml  Net    150 ml     LAB RESULTS: CBC  Recent Labs Lab 10/01/13 1515 10/03/13 0709 10/04/13 0650 10/05/13 0546 10/07/13 0615  WBC 5.9 4.9 4.7 5.2 5.6  HGB 9.6* 8.1* 8.9* 9.0* 8.5*  HCT 29.0* 23.9* 26.1* 26.4* 25.4*  PLT 82* 107* 113* 127* 149*  MCV 105.5* 100.4* 101.6* 101.5* 101.2*  MCH 34.9* 34.0 34.6* 34.6* 33.9  MCHC 33.1 33.9 34.1 34.1 33.5  RDW 13.9 13.0 13.3 13.3 13.3  LYMPHSABS 0.9  --   --   --   --   MONOABS 1.1*  --   --   --   --   EOSABS 0.1  --   --   --   --   BASOSABS 0.0  --   --   --   --     Chemistries   Recent Labs Lab 10/01/13 1515 10/03/13 0709 10/04/13 0650 10/05/13 0546 10/07/13 0615  NA 136* 136* 136* 138  138 134*  K 4.3 4.8 4.2 3.8  3.8 4.2  CL 97 98 95* 95*  95* 91*  CO2 24 22 25 26  26 24   GLUCOSE 167* 132* 151* 170*  170* 147*  BUN 36* 55* 33* 25*  25* 40*  CREATININE 6.65* 8.43* 5.76* 4.69*  4.76* 6.29*  CALCIUM 9.2 8.7 8.7 9.2  9.1 9.3    CBG:  Recent Labs Lab 10/07/13 0812 10/07/13 1154 10/07/13 1733 10/07/13 2120 10/08/13 0833  GLUCAP 142* 173* 201* 154* 139*    GFR Estimated Creatinine Clearance: 10.8 ml/min (by C-G formula based on Cr of 6.29).  Coagulation profile No results found for this basename: INR, PROTIME,  in the last 168 hours  Cardiac Enzymes No results found for this basename: CK, CKMB, TROPONINI, MYOGLOBIN,  in the last 168 hours  No  components found with this basename: POCBNP,  No results found for this basename: DDIMER,  in the last 72 hours No results found for this basename: HGBA1C,  in the last 72 hours No results found for this basename: CHOL, HDL, LDLCALC, TRIG, CHOLHDL, LDLDIRECT,  in the last 72 hours No results found for this basename: TSH, T4TOTAL, FREET3, T3FREE, THYROIDAB,  in the last 72 hours No results found for this  basename: VITAMINB12, FOLATE, FERRITIN, TIBC, IRON, RETICCTPCT,  in the last 72 hours No results found for this basename: LIPASE, AMYLASE,  in the last 72 hours  Urine Studies No results found for this basename: UACOL, UAPR, USPG, UPH, UTP, UGL, UKET, UBIL, UHGB, UNIT, UROB, ULEU, UEPI, UWBC, URBC, UBAC, CAST, CRYS, UCOM, BILUA,  in the last 72 hours  MICROBIOLOGY: Recent Results (from the past 240 hour(s))  URINE CULTURE     Status: None   Collection Time    10/01/13  3:39 PM      Result Value Range Status   Specimen Description URINE, CATHETERIZED   Final   Special Requests ADDED 0056 10/02/13   Final   Culture  Setup Time     Final   Value: 10/02/2013 12:02     Performed at [facility]   Colony Count     Final   Value: 45,000 COLONIES/ML     Performed at [facility]   Culture     Final   Value: ESCHERICHIA COLI     MORGANELLA MORGANII     Performed at [facility]   Report Status 10/05/2013 FINAL   Final   Organism ID, Bacteria ESCHERICHIA COLI   Final   Organism ID, Bacteria MORGANELLA MORGANII   Final  CULTURE, BLOOD (ROUTINE X 2)     Status: None   Collection Time    10/03/13  4:24 PM      Result Value Range Status   Specimen Description BLOOD LEFT HAND   Final   Special Requests BOTTLES DRAWN AEROBIC ONLY 10CC   Final   Culture  Setup Time     Final   Value: 10/04/2013 00:59     Performed at [facility]   Culture     Final   Value:        BLOOD CULTURE RECEIVED NO GROWTH TO DATE CULTURE WILL BE HELD FOR 5 DAYS BEFORE ISSUING A FINAL  NEGATIVE REPORT     Performed at [facility]   Report Status PENDING   Incomplete  CULTURE, BLOOD (ROUTINE X 2)     Status: None   Collection Time    10/03/13  9:20 PM      Result Value Range Status   Specimen Description BLOOD LEFT FOREARM   Final   Special Requests BOTTLES DRAWN AEROBIC ONLY 8CC   Final   Culture  Setup Time     Final   Value: 10/04/2013 00:59     Performed at [facility]   Culture     Final   Value:        BLOOD CULTURE RECEIVED NO GROWTH TO DATE CULTURE WILL BE HELD FOR 5 DAYS BEFORE ISSUING A FINAL NEGATIVE REPORT     Performed at [facility]   Report Status PENDING   Incomplete    RADIOLOGY STUDIES/RESULTS: Dg Chest 2 View  09/25/2013   CLINICAL DATA:  Altered mental status.  EXAM: CHEST  2 VIEW  COMPARISON:  01/24/2013  FINDINGS: Two views of the chest were obtained. Mild fullness in the right peritracheal region is unchanged. Heart size is normal. No evidence for airspace disease or edema. No evidence for pleural effusions.  IMPRESSION: No acute chest findings.   Electronically Signed   By: Richarda Overlie M.D.   On: 09/25/2013 21:37   Ct Head Wo Contrast  10/01/2013   CLINICAL DATA:  Intermittent confusion and poor communication, 2 episodes of urinary  incontinence.  EXAM: CT HEAD WITHOUT CONTRAST  TECHNIQUE: Contiguous axial images were obtained from the base of the skull through the vertex without intravenous contrast.  COMPARISON:  MRI of the brain September 27, 2013. CT of the head October 03, 2013  FINDINGS: The ventricles and sulci are normal for age. No intraparenchymal hemorrhage, mass effect nor midline shift. Confluent supratentorial white matter hypodensities are within normal range for patient's age and though non-specific suggest sequelae of chronic small vessel ischemic disease. Bilateral basal ganglia mineralization. No acute large vascular territory infarcts.  No abnormal extra-axial fluid collections. Basal cisterns are patent.  Moderate calcific atherosclerosis of the carotid siphons.  No skull fracture. Small bony excrescence from the inner table of the left high frontal calvarium could reflect a meningioma without mass effect. Mild maxillary mucosal thickening with small air-fluid level on the right. Mastoid air cells are well aerated. The included ocular globes and orbital contents are non-suspicious. Status post bilateral ocular lens implants.  IMPRESSION: No acute intracranial process.  Involutional changes. Severe white matter changes, similar to prior examination in though nonspecific may reflect chronic small vessel ischemic disease.  Mild apparent acute on chronic maxillary status.   Electronically Signed   By: [name]   On: 10/01/2013 22:11   Ct Head Wo Contrast  09/25/2013   CLINICAL DATA:  Altered level of consciousness. Altered mental status.  EXAM: CT HEAD WITHOUT CONTRAST  TECHNIQUE: Contiguous axial images were obtained from the base of the skull through the vertex without intravenous contrast.  COMPARISON:  01/15/2013  FINDINGS: There is no evidence of intracranial hemorrhage, brain edema, or other signs of acute infarction. There is no evidence of intracranial mass lesion or mass effect. No abnormal extraaxial fluid collections are identified.  Mild cerebral atrophy is stable. Extensive chronic small vessel disease is also unchanged in appearance. Ventricles are stable in size. No evidence of skull fracture or other significant bone abnormality.  IMPRESSION: No acute intracranial findings.  Stable chronic small vessel disease and mild cerebral atrophy.   Electronically Signed   By: [name] M.D.   On: 09/25/2013 20:04   Mr Brain Wo Contrast  09/27/2013   CLINICAL DATA:  Altered mental status.  EXAM: MRI HEAD WITHOUT CONTRAST  TECHNIQUE: Multiplanar, multiecho pulse sequences of the brain and surrounding structures were obtained without intravenous contrast.  COMPARISON:  Head CT 09/25/2013 and brain MRI  01/16/2013  FINDINGS: Images are mildly to moderately degraded by motion artifact.  Again seen are multiple punctate foci of susceptibility artifact within both cerebral hemispheres, greatest in the occipital lobes and posterior left temporal lobe. These are have slightly increased in number from the prior study and are compatible with remote microhemorrhages. There is no acute infarct. Confluent T2 hyperintensity within the subcortical and deep cerebral white matter bilaterally has increased significantly from the prior MRI, particularly in the frontal lobes. There is moderate cerebral atrophy, also increased from the prior MRI. There is no evidence of mass, midline shift, or extra-axial fluid collection.  Major intracranial vascular flow voids are unremarkable. Prior bilateral cataract surgery is noted. Mild bilateral ethmoid and maxillary sinus mucosal thickening is noted. Mastoid air cells are clear. Limited visualization of the upper cervical spine demonstrates moderate multilevel spondylosis.  IMPRESSION: 1. No evidence of acute infarct. 2. Significant interval progression of white matter disease from prior MRI. While this may represent progression of chronic microvascular ischemia, other etiologies, such as metabolic and infectious encephalopathies (including HIV), are not excluded.  3. Mild interval progression of cerebral atrophy and bilateral cerebral microhemorrhages.   Electronically Signed   By: Sebastian Ache   On: 09/27/2013 19:53   Dg Chest Portable 1 View  10/01/2013   CLINICAL DATA:  Incontinence.  Confusion.  Weakness.  EXAM: PORTABLE CHEST - 1 VIEW  COMPARISON:  09/25/2013  FINDINGS: Right paratracheal densities attributable to ectatic vasculature. Heart size within normal limits. Low lung volumes are present, causing crowding of the pulmonary vasculature.  The lungs appear clear.  IMPRESSION: No acute thoracic findings.   Electronically Signed   By: Herbie Baltimore M.D.   On: 10/01/2013 18:25      Jeoffrey Massed, MD  [facility] Pager:[phone]  If 7PM-7AM, please contact night-coverage [email or web address] Password [email or web address] 10/08/2013, 11:38 AM   LOS: 7 days

## 2013-10-09 ENCOUNTER — Encounter (HOSPITAL_COMMUNITY): Payer: Self-pay

## 2013-10-09 LAB — RENAL FUNCTION PANEL
ALBUMIN: 2.5 g/dL — AB (ref 3.5–5.2)
BUN: 26 mg/dL — ABNORMAL HIGH (ref 6–23)
CALCIUM: 8.5 mg/dL (ref 8.4–10.5)
CO2: 24 meq/L (ref 19–32)
CREATININE: 4.61 mg/dL — AB (ref 0.50–1.35)
Chloride: 95 mEq/L — ABNORMAL LOW (ref 96–112)
GFR calc Af Amer: 13 mL/min — ABNORMAL LOW (ref >90)
GFR calc non Af Amer: 11 mL/min — ABNORMAL LOW (ref >90)
Glucose, Bld: 140 mg/dL — ABNORMAL HIGH (ref 70–99)
Phosphorus: 3.6 mg/dL (ref 2.3–4.6)
Potassium: 3.6 mEq/L — ABNORMAL LOW (ref 3.7–5.3)
Sodium: 139 mEq/L (ref 137–147)

## 2013-10-09 LAB — CBC
HCT: 24.6 % — ABNORMAL LOW (ref 39.0–52.0)
Hemoglobin: 8.1 g/dL — ABNORMAL LOW (ref 13.0–17.0)
MCH: 33.5 pg (ref 26.0–34.0)
MCHC: 32.9 g/dL (ref 30.0–36.0)
MCV: 101.7 fL — AB (ref 78.0–100.0)
Platelets: 151 10*3/uL (ref 150–400)
RBC: 2.42 MIL/uL — ABNORMAL LOW (ref 4.22–5.81)
RDW: 13.6 % (ref 11.5–15.5)
WBC: 3.7 10*3/uL — ABNORMAL LOW (ref 4.0–10.5)

## 2013-10-09 LAB — GLUCOSE, CAPILLARY
GLUCOSE-CAPILLARY: 137 mg/dL — AB (ref 70–99)
Glucose-Capillary: 135 mg/dL — ABNORMAL HIGH (ref 70–99)

## 2013-10-09 MED ORDER — LACOSAMIDE 100 MG PO TABS: 50.0000 mg | ORAL_TABLET | Freq: Two times a day (BID) | ORAL | Status: DC

## 2013-10-09 MED ORDER — NEPRO/CARBSTEADY PO LIQD: 237.0000 mL | Freq: Two times a day (BID) | ORAL | Status: DC

## 2013-10-09 MED ORDER — INSULIN ASPART 100 UNIT/ML ~~LOC~~ SOLN: SUBCUTANEOUS | Status: DC

## 2013-10-09 MED ORDER — SODIUM CHLORIDE 0.9 % IV SOLN
50.0000 mg | Freq: Two times a day (BID) | INTRAVENOUS | Status: DC
  Administered 2013-10-09: 50 mg via INTRAVENOUS
  Filled 2013-10-09 (×2): qty 5

## 2013-10-09 NOTE — Clinical Social Work Placement (Signed)
Clinical Social Work Department CLINICAL SOCIAL WORK PLACEMENT NOTE 10/09/2013  Patient:  [facility]  Account Number:  [id number] Admit date:  10/01/2013  Clinical Social Worker:  Cherre Blanc, [place]  Date/time:  10/03/2013 03:30 PM  Clinical Social Work is seeking post-discharge placement for this patient at the following level of care:   SKILLED NURSING   (*CSW will update this form in Epic as items are completed)   10/03/2013  Patient/family provided with [facility] Department of Clinical Social Work's list of facilities offering this level of care within the geographic area requested by the patient (or if unable, by the patient's family).  10/03/2013  Patient/family informed of their freedom to choose among providers that offer the needed level of care, that participate in Medicare, Medicaid or managed care program needed by the patient, have an available bed and are willing to accept the patient.  10/03/2013  Patient/family informed of [facility]' ownership interest in [facility], as well as of the fact that they are under no obligation to receive care at this facility.  PASARR submitted to EDS on 10/03/2013 PASARR number received from [employer] on 10/03/2013  FL2 transmitted to all facilities in geographic area requested by pt/family on  10/03/2013 FL2 transmitted to all facilities within larger geographic area on   Patient informed that his/her managed care company has contracts with or will negotiate with  certain facilities, including the following:     Patient/family informed of bed offers received:  10/04/2013 Patient chooses bed at [facility] Physician recommends and patient chooses bed at    Patient to be transferred to [facility] on  10/09/2013 Patient to be transferred to facility by Ambulance.  The following physician request were entered in Epic:   Additional Comments: Per MD patient ready to DC to [facility]. RN,  patient, family, and facility notified of DC. RN given number for report. DC packet left on chart. Ambulance transport requested for patient. CSW signing off.   Roddie Mc, [place], [place], [phone]

## 2013-10-09 NOTE — Discharge Summary (Signed)
PATIENT DETAILS Name: [name] Age: 80 y.o. Sex: male Date of Birth: 27-Feb-1932 MRN: [id number]. Admit Date: 10/01/2013 Admitting Physician: Maretta Bees, MD JIR:[id number],WCHENI A, MD  Recommendations for Outpatient Follow-up:  1. Monitor CBC and chemistries periodically 2. Please keep appointment with neurology on 2/11-see below  PRIMARY DISCHARGE DIAGNOSIS:  Principal Problem:   Metabolic encephalopathy Active Problems:   Anemia in chronic kidney disease   Diabetes type 2, controlled   Seizures   Altered mental state   ESRD on dialysis   Urinary incontinence   Other pancytopenia   Failure to thrive in adult      PAST MEDICAL HISTORY: Past Medical History  Diagnosis Date  . Hypertension   . Diabetes mellitus without complication   . Arthritis   . History of blood transfusion   . GI bleed   . ESRD (end stage renal disease)   . Seizure     DISCHARGE MEDICATIONS:   Medication List         amLODipine 10 MG tablet  Commonly known as:  NORVASC  Take 10 mg by mouth Daily.     calcium carbonate 500 MG chewable tablet  Commonly known as:  TUMS - dosed in mg elemental calcium  Chew 1 tablet (200 mg of elemental calcium total) by mouth 3 (three) times daily with meals.     darbepoetin 60 MCG/0.3ML Soln injection  Commonly known as:  ARANESP  Inject 0.3 mLs (60 mcg total) into the vein every Thursday with hemodialysis.     doxercalciferol 4 MCG/2ML injection  Commonly known as:  HECTOROL  Inject 2 mLs (4 mcg total) into the vein Every Tuesday,Thursday,and Saturday with dialysis.     feeding supplement (NEPRO CARB STEADY) Liqd  Take 237 mLs by mouth 2 (two) times daily between meals.     folic acid 1 MG tablet  Commonly known as:  FOLVITE  Take 1 tablet (1 mg total) by mouth daily.     insulin aspart 100 UNIT/ML injection  Commonly known as:  novoLOG  - 0-9 Units, Subcutaneous, 3 times daily with meals  - CBG < 70: implement hypoglycemia  protocol  - CBG 70 - 120: 0 units  - CBG 121 - 150: 1 unit  - CBG 151 - 200: 2 units  - CBG 201 - 250: 3 units  - CBG 251 - 300: 5 units  - CBG 301 - 350: 7 units  - CBG 351 - 400: 9 units  - CBG > 400: call MD     Lacosamide 100 MG Tabs  Take 0.5 tablets (50 mg total) by mouth 2 (two) times daily.     multivitamin Tabs tablet  Take 1 tablet by mouth at bedtime.     pantoprazole 20 MG tablet  Commonly known as:  PROTONIX  Take 40 mg by mouth 2 (two) times daily.     sodium chloride 0.9 % SOLN 100 mL with ferric gluconate 12.5 MG/ML SOLN 62.5 mg  Inject 62.5 mg into the vein every Thursday with hemodialysis.        ALLERGIES:  No Known Allergies  BRIEF HPI:  See H&P, Labs, Consult and Test reports for all details in brief, patient is a 80 year old man with a history of end-stage disease on hemodialysis Tuesdays Thursdays and Saturdays, seizure disorder who was just discharged from this facility days prior to this admission, presented with change in mental status and weakness. He was subsequently admitted to the hospitalist service for further  evaluation and treatment.  CONSULTATIONS:   nephrology and neurology  PERTINENT RADIOLOGIC STUDIES: Dg Chest 2 View  09/25/2013   CLINICAL DATA:  Altered mental status.  EXAM: CHEST  2 VIEW  COMPARISON:  01/24/2013  FINDINGS: Two views of the chest were obtained. Mild fullness in the right peritracheal region is unchanged. Heart size is normal. No evidence for airspace disease or edema. No evidence for pleural effusions.  IMPRESSION: No acute chest findings.   Electronically Signed   By: [name] M.D.   On: 09/25/2013 21:37   Ct Head Wo Contrast  10/01/2013   CLINICAL DATA:  Intermittent confusion and poor communication, 2 episodes of urinary incontinence.  EXAM: CT HEAD WITHOUT CONTRAST  TECHNIQUE: Contiguous axial images were obtained from the base of the skull through the vertex without intravenous contrast.  COMPARISON:  MRI  of the brain September 27, 2013. CT of the head October 03, 2013  FINDINGS: The ventricles and sulci are normal for age. No intraparenchymal hemorrhage, mass effect nor midline shift. Confluent supratentorial white matter hypodensities are within normal range for patient's age and though non-specific suggest sequelae of chronic small vessel ischemic disease. Bilateral basal ganglia mineralization. No acute large vascular territory infarcts.  No abnormal extra-axial fluid collections. Basal cisterns are patent. Moderate calcific atherosclerosis of the carotid siphons.  No skull fracture. Small bony excrescence from the inner table of the left high frontal calvarium could reflect a meningioma without mass effect. Mild maxillary mucosal thickening with small air-fluid level on the right. Mastoid air cells are well aerated. The included ocular globes and orbital contents are non-suspicious. Status post bilateral ocular lens implants.  IMPRESSION: No acute intracranial process.  Involutional changes. Severe white matter changes, similar to prior examination in though nonspecific may reflect chronic small vessel ischemic disease.  Mild apparent acute on chronic maxillary status.   Electronically Signed   By: [name]   On: 10/01/2013 22:11   Ct Head Wo Contrast  09/25/2013   CLINICAL DATA:  Altered level of consciousness. Altered mental status.  EXAM: CT HEAD WITHOUT CONTRAST  TECHNIQUE: Contiguous axial images were obtained from the base of the skull through the vertex without intravenous contrast.  COMPARISON:  01/15/2013  FINDINGS: There is no evidence of intracranial hemorrhage, brain edema, or other signs of acute infarction. There is no evidence of intracranial mass lesion or mass effect. No abnormal extraaxial fluid collections are identified.  Mild cerebral atrophy is stable. Extensive chronic small vessel disease is also unchanged in appearance. Ventricles are stable in size. No evidence of skull  fracture or other significant bone abnormality.  IMPRESSION: No acute intracranial findings.  Stable chronic small vessel disease and mild cerebral atrophy.   Electronically Signed   By: [name] M.D.   On: 09/25/2013 20:04   Mr Brain Wo Contrast  10/06/2013   ADDENDUM REPORT: 10/06/2013 19:52  ADDENDUM: Disc degeneration and spondylosis in the cervical spine, incompletely evaluated on the study   Electronically Signed   By: Marlan Palauharles  Clark M.D.   On: 10/06/2013 19:52   10/06/2013   CLINICAL DATA:  Altered mental status  EXAM: MRI HEAD WITHOUT CONTRAST  TECHNIQUE: Multiplanar, multiecho pulse sequences of the brain and surrounding structures were obtained without intravenous contrast.  COMPARISON:  MRI 09/27/2013  FINDINGS: Negative for acute infarct.  Moderate atrophy. Advanced hyperintensity throughout the periventricular and deep white matter throughout both cerebral hemispheres. This is stable since 09/27/2013 but has progressed since 01/16/2013. The basal  ganglia and brainstem are intact.  Numerous areas of chronic micro hemorrhage bilaterally especially in the occipital lobes, stable. Question chronic hypertension.  Negative for mass or edema.  No shift of the midline structures.  Mild mucosal edema in the paranasal sinuses.  IMPRESSION: Advanced chronic changes throughout cerebral white matter bilaterally are stable since the recent study. This may be due to chronic microvascular ischemia. Chemotherapy and radiation and other toxins could also cause this appearance.  No acute infarct.  Electronically Signed: By: Marlan Palau M.D. On: 10/06/2013 19:00   Mr Brain Wo Contrast  09/27/2013   CLINICAL DATA:  Altered mental status.  EXAM: MRI HEAD WITHOUT CONTRAST  TECHNIQUE: Multiplanar, multiecho pulse sequences of the brain and surrounding structures were obtained without intravenous contrast.  COMPARISON:  Head CT 09/25/2013 and brain MRI 01/16/2013  FINDINGS: Images are mildly to moderately degraded  by motion artifact.  Again seen are multiple punctate foci of susceptibility artifact within both cerebral hemispheres, greatest in the occipital lobes and posterior left temporal lobe. These are have slightly increased in number from the prior study and are compatible with remote microhemorrhages. There is no acute infarct. Confluent T2 hyperintensity within the subcortical and deep cerebral white matter bilaterally has increased significantly from the prior MRI, particularly in the frontal lobes. There is moderate cerebral atrophy, also increased from the prior MRI. There is no evidence of mass, midline shift, or extra-axial fluid collection.  Major intracranial vascular flow voids are unremarkable. Prior bilateral cataract surgery is noted. Mild bilateral ethmoid and maxillary sinus mucosal thickening is noted. Mastoid air cells are clear. Limited visualization of the upper cervical spine demonstrates moderate multilevel spondylosis.  IMPRESSION: 1. No evidence of acute infarct. 2. Significant interval progression of white matter disease from prior MRI. While this may represent progression of chronic microvascular ischemia, other etiologies, such as metabolic and infectious encephalopathies (including HIV), are not excluded. 3. Mild interval progression of cerebral atrophy and bilateral cerebral microhemorrhages.   Electronically Signed   By: Sebastian Ache   On: 09/27/2013 19:53   Dg Chest Portable 1 View  10/01/2013   CLINICAL DATA:  Incontinence.  Confusion.  Weakness.  EXAM: PORTABLE CHEST - 1 VIEW  COMPARISON:  09/25/2013  FINDINGS: Right paratracheal densities attributable to ectatic vasculature. Heart size within normal limits. Low lung volumes are present, causing crowding of the pulmonary vasculature.  The lungs appear clear.  IMPRESSION: No acute thoracic findings.   Electronically Signed   By: Herbie [place] M.D.   On: 10/01/2013 18:25     PERTINENT LAB RESULTS: CBC:  Recent Labs   10/08/13 1444 10/09/13 0508  WBC 3.1* 3.7*  HGB 7.9* 8.1*  HCT 23.0* 24.6*  PLT 153 151   CMET CMP     Component Value Date/Time   NA 139 10/09/2013 0508   K 3.6* 10/09/2013 0508   CL 95* 10/09/2013 0508   CO2 24 10/09/2013 0508   GLUCOSE 140* 10/09/2013 0508   BUN 26* 10/09/2013 0508   CREATININE 4.61* 10/09/2013 0508   CALCIUM 8.5 10/09/2013 0508   PROT 7.0 10/05/2013 0546   ALBUMIN 2.5* 10/09/2013 0508   AST 18 10/05/2013 0546   ALT 8 10/05/2013 0546   ALKPHOS 39 10/05/2013 0546   BILITOT 0.5 10/05/2013 0546   GFRNONAA 11* 10/09/2013 0508   GFRAA 13* 10/09/2013 0508    GFR Estimated Creatinine Clearance: 14.7 ml/min (by C-G formula based on Cr of 4.61). No results found for this basename: LIPASE,  AMYLASE,  in the last 72 hours No results found for this basename: CKTOTAL, CKMB, CKMBINDEX, TROPONINI,  in the last 72 hours No components found with this basename: POCBNP,  No results found for this basename: DDIMER,  in the last 72 hours No results found for this basename: HGBA1C,  in the last 72 hours No results found for this basename: CHOL, HDL, LDLCALC, TRIG, CHOLHDL, LDLDIRECT,  in the last 72 hours No results found for this basename: TSH, T4TOTAL, FREET3, T3FREE, THYROIDAB,  in the last 72 hours No results found for this basename: VITAMINB12, FOLATE, FERRITIN, TIBC, IRON, RETICCTPCT,  in the last 72 hours Coags: No results found for this basename: PT, INR,  in the last 72 hours Microbiology: Recent Results (from the past 240 hour(s))  URINE CULTURE     Status: None   Collection Time    10/01/13  3:39 PM      Result Value Range Status   Specimen Description URINE, CATHETERIZED   Final   Special Requests ADDED 0056 10/02/13   Final   Culture  Setup Time     Final   Value: 10/02/2013 12:02     Performed at [facility]   Colony Count     Final   Value: 45,000 COLONIES/ML     Performed at [facility]   Culture     Final   Value: ESCHERICHIA COLI     MORGANELLA MORGANII      Performed at [facility]   Report Status 10/05/2013 FINAL   Final   Organism ID, Bacteria ESCHERICHIA COLI   Final   Organism ID, Bacteria MORGANELLA MORGANII   Final  CULTURE, BLOOD (ROUTINE X 2)     Status: None   Collection Time    10/03/13  4:24 PM      Result Value Range Status   Specimen Description BLOOD LEFT HAND   Final   Special Requests BOTTLES DRAWN AEROBIC ONLY 10CC   Final   Culture  Setup Time     Final   Value: 10/04/2013 00:59     Performed at [facility]   Culture     Final   Value:        BLOOD CULTURE RECEIVED NO GROWTH TO DATE CULTURE WILL BE HELD FOR 5 DAYS BEFORE ISSUING A FINAL NEGATIVE REPORT     Performed at [facility]   Report Status PENDING   Incomplete  CULTURE, BLOOD (ROUTINE X 2)     Status: None   Collection Time    10/03/13  9:20 PM      Result Value Range Status   Specimen Description BLOOD LEFT FOREARM   Final   Special Requests BOTTLES DRAWN AEROBIC ONLY 8CC   Final   Culture  Setup Time     Final   Value: 10/04/2013 00:59     Performed at [facility]   Culture     Final   Value:        BLOOD CULTURE RECEIVED NO GROWTH TO DATE CULTURE WILL BE HELD FOR 5 DAYS BEFORE ISSUING A FINAL NEGATIVE REPORT     Performed at [facility]   Report Status PENDING   Incomplete     BRIEF HOSPITAL COURSE:  Altered mental status  - Patient was admitted and provided supportive care. Significantly much more awake and alert than on admission, but very deconditioned. - Suspicion for acute metabolic encephalopathy-etiology unknown. No fever, leukocytosis or any other  source of infection to suggest that this is from sepsis. However urine cultures positive for Morganella and Escherichia coli both are sensitive to Rocephin, has completed a seven-day course with Rocephin. Not sure if this questionable UTI is the cause for altered mental status. 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 - Similar presentation last admission very recently, this was attributed it to supratherapeutic Depakote levels. MRI brain and prolonged EEG during that admission was negative. Apparently patient was switched over to Vimpat, made some clinical improvement and then was discharged home. He unfortunately returned return in a few days with recurrence of altered mental status and was very admitted.  - Neurology has been consulted, repeat EEG and MRI brain did not show any major abnormalities. Per neurology, no role for lumbar puncture, they recommended to hold Vimpat- was off Vimpat since 2/1, followed up by neurology today, recommended half dose of Vimpat- therefore those will be decreased to 50 mg twice daily on discharge. - Please consider outpatient neurology followup while at skilled nursing facility.  End-stage renal disease  - On hemodialysis, nephrology following   Anemia  - Suspect are seen hemoglobin secondary to Depakote toxicity, slowly hemoglobin improving. Baseline anemia of chronic disease secondary to end-stage disease.   Thrombocytopenia  - Secondary to recent Depakote toxicity- platelet count now has normalized. Continue to monitor   History of seizure disorder  - Continue with Vimpat - but those will be decreased to 50 mg twice a day.  Hypertension  - Blood pressure controlled  - Continue with amlodipine   Diabetes  - CBGs controlled  - Continue with SSI   TODAY-DAY OF DISCHARGE:  Subjective:   Harwood Voges today has no headache,no chest abdominal pain,no new weakness tingling or numbness, he is much more awake and alert, but still very deconditioned.  Objective:   Blood pressure 144/74, pulse 88, temperature 98.7 F (37.1 C), temperature source Oral, resp. rate 16, height 6\' 1"  (1.854 m), weight 87.4 kg (192 lb 10.9 oz), SpO2  95.00%.  Intake/Output Summary (Last 24 hours) at 10/09/13 1145 Last data filed at 10/09/13 0535  Gross per 24 hour  Intake    300 ml  Output   1700 ml  Net  -1400 ml   Filed Weights   10/05/13 1120 10/07/13 0631 10/08/13 0634  Weight: 90 kg (198 lb 6.6 oz) 87.4 kg (192 lb 10.9 oz) 87.4 kg (192 lb 10.9 oz)    Exam Awake Alert, Oriented *3, No new F.N deficits, Normal affect [place].AT,PERRAL Supple Neck,No JVD, No cervical lymphadenopathy appriciated.  Symmetrical Chest wall movement, Good air movement bilaterally, CTAB RRR,No Gallops,Rubs or new Murmurs, No Parasternal Heave +ve B.Sounds, Abd Soft, Non tender, No organomegaly appriciated, No rebound -guarding or rigidity. No Cyanosis, Clubbing or edema, No new Rash or bruise  DISCHARGE CONDITION: Stable  DISPOSITION: SNF  DISCHARGE INSTRUCTIONS:    Activity:  As tolerated with Full fall precautions use walker/cane & assistance as needed  Diet recommendation: Diabetic Diet Heart Healthy diet Renal diet      Discharge Orders   Future Appointments Provider Department Dept Phone   10/17/2013 2:00 PM 12/15/2013, MD [facility] [phone]   Future Orders Complete By Expires   Call MD for:  difficulty breathing, headache or visual disturbances  As directed    Call MD for:  extreme fatigue  As directed  Call MD for:  persistant dizziness or light-headedness  As directed    Diet - low sodium heart healthy  As directed    Scheduling Instructions:     Renal diet   Diet Carb Modified  As directed    Increase activity slowly  As directed       Follow-up Information   Follow up with GOLDSBOROUGH,KELLIE A, MD. Schedule an appointment as soon as possible for a visit in 1 week.   Specialty:  Nephrology   Contact information:   [address] [place] [address] [phone]       Please follow up.   Contact information:   hemodialysis center-at your usual schedule-Tuesday, Thursday and Saturday       Total Time spent on discharge equals 45 minutes.  SignedJeoffrey Massed 10/09/2013 11:45 AM

## 2013-10-09 NOTE — Progress Notes (Signed)
I have seen and examined this patient and agree with the plan of care  [facility] W 10/09/2013, 11:34 AM

## 2013-10-09 NOTE — Progress Notes (Signed)
NEURO HOSPITALIST PROGRESS NOTE   SUBJECTIVE:                                                                                                                        Sleepy but open his eyes and follows commands. Knows he is at [facility]. Off vimpat. No new neurological developments.  OBJECTIVE:                                                                                                                           Vital signs in last 24 hours: Temp:  [98 F (36.7 C)-98.7 F (37.1 C)] 98.7 F (37.1 C) (02/03 1043) Pulse Rate:  [83-96] 88 (02/03 1043) Resp:  [16-18] 16 (02/03 1043) BP: (99-155)/(53-74) 144/74 mmHg (02/03 1043) SpO2:  [95 %-99 %] 95 % (02/03 0516)  Intake/Output from previous day: 02/02 0701 - 02/03 0700 In: 300 [P.O.:300] Out: 1700 [Urine:400] Intake/Output this shift:   Nutritional status: Renal  Past Medical History  Diagnosis Date  . Hypertension   . Diabetes mellitus without complication   . Arthritis   . History of blood transfusion   . GI bleed   . ESRD (end stage renal disease)   . Seizure     Neurologic Exam:  Drowsy, wakes up from sleep, follows simple and 2nd order commands. Speech/language difficult to evaluate but no frank dysarthria or dysphasia that I can detect at this moment. Cranial Nerves:  II: Discs flat bilaterally; Visual fields grossly normal, pupils equal, round, reactive to light  III,IV, VI: ptosis not present, extra-ocular motions intact bilaterally  V,VII: smile symmetric, facial light touch sensation normal bilaterally  VIII: hearing normal bilaterally  IX,X: gag reflex present  XI: bilateral shoulder shrug no tested  XII: midline tongue extension without atrophy or fasciculations  Motor:  Moves all limbs symmetrically.  Tone and bulk:normal tone throughout; no atrophy noted  Sensory: no tested  Deep Tendon Reflexes:  1+ all over  Plantars:  Right: downgoing Left:  downgoing  Cerebellar:  No tested.  Gait:  Unable to test   Lab Results: No results found for this basename: cbc, bmp, coags, chol, tri, ldl, hga1c   Lipid Panel No results found for this basename: CHOL, TRIG,  HDL, CHOLHDL, VLDL, LDLCALC,  in the last 72 hours  Studies/Results: No results found.  MEDICATIONS                                                                                                                       I have reviewed the patient's current medications.  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 He had had seizures recently and thus will recommend resuming vimpat at a lower dose ( 50 mg BID). He will need outpatient neurology follow up. Will sign off.   Wyatt Portela, MD [facility] [phone]  10/09/2013, 11:18 AM

## 2013-10-09 NOTE — Progress Notes (Signed)
[name] to be D/C'd Skilled nursing facility [facility] per MD order.  Discussed with the patient and all questions fully answered.    Medication List         amLODipine 10 MG tablet  Commonly known as:  NORVASC  Take 10 mg by mouth Daily.     calcium carbonate 500 MG chewable tablet  Commonly known as:  TUMS - dosed in mg elemental calcium  Chew 1 tablet (200 mg of elemental calcium total) by mouth 3 (three) times daily with meals.     darbepoetin 60 MCG/0.3ML Soln injection  Commonly known as:  ARANESP  Inject 0.3 mLs (60 mcg total) into the vein every Thursday with hemodialysis.     doxercalciferol 4 MCG/2ML injection  Commonly known as:  HECTOROL  Inject 2 mLs (4 mcg total) into the vein Every Tuesday,Thursday,and Saturday with dialysis.     feeding supplement (NEPRO CARB STEADY) Liqd  Take 237 mLs by mouth 2 (two) times daily between meals.     folic acid 1 MG tablet  Commonly known as:  FOLVITE  Take 1 tablet (1 mg total) by mouth daily.     insulin aspart 100 UNIT/ML injection  Commonly known as:  novoLOG  - 0-9 Units, Subcutaneous, 3 times daily with meals  - CBG < 70: implement hypoglycemia protocol  - CBG 70 - 120: 0 units  - CBG 121 - 150: 1 unit  - CBG 151 - 200: 2 units  - CBG 201 - 250: 3 units  - CBG 251 - 300: 5 units  - CBG 301 - 350: 7 units  - CBG 351 - 400: 9 units  - CBG > 400: call MD     Lacosamide 100 MG Tabs  Take 0.5 tablets (50 mg total) by mouth 2 (two) times daily.     multivitamin Tabs tablet  Take 1 tablet by mouth at bedtime.     pantoprazole 20 MG tablet  Commonly known as:  PROTONIX  Take 40 mg by mouth 2 (two) times daily.     sodium chloride 0.9 % SOLN 100 mL with ferric gluconate 12.5 MG/ML SOLN 62.5 mg  Inject 62.5 mg into the vein every Thursday with hemodialysis.        VVS, Skin clean, dry and intact without evidence of skin break down, no evidence of skin tears noted. IV catheter  discontinued intact. Site without signs and symptoms of complications. Dressing and pressure applied.  An After Visit Summary was printed and given to PTAR to give to the facility. Follow up appointments , new prescriptions and medication administration times given to PTAR. Report given to attending nurse and all questions answered. Patient escorted via WC, and D/C home via private auto.  Cindra Eves, RN 10/09/2013 4:34 PM

## 2013-10-09 NOTE — Progress Notes (Signed)
Subjective:  Not talkative after sleeping, but no complaints  Objective: Vital signs in last 24 hours: Temp:  [98 F (36.7 C)-98.7 F (37.1 C)] 98.7 F (37.1 C) (02/03 1043) Pulse Rate:  [83-96] 88 (02/03 1043) Resp:  [16-18] 16 (02/03 1043) BP: (99-155)/(53-74) 144/74 mmHg (02/03 1043) SpO2:  [95 %-99 %] 95 % (02/03 0516) Weight change:   Intake/Output from previous day: 02/02 0701 - 02/03 0700 In: 300 [P.O.:300] Out: 1700 [Urine:400]   Lab Results:  Recent Labs  10/08/13 1444 10/09/13 0508  WBC 3.1* 3.7*  HGB 7.9* 8.1*  HCT 23.0* 24.6*  PLT 153 151   BMET:  Recent Labs  10/08/13 1444 10/09/13 0508  NA 135* 139  K 4.1 3.6*  CL 93* 95*  CO2 25 24  GLUCOSE 188* 140*  BUN 46* 26*  CREATININE 6.16* 4.61*  CALCIUM 8.7 8.5  ALBUMIN 2.5* 2.5*   No results found for this basename: PTH,  in the last 72 hours Iron Studies: No results found for this basename: IRON, TIBC, TRANSFERRIN, FERRITIN,  in the last 72 hours  EXAM:  General appearance: Alert, in no apparent distress  Resp: CTA without rales, rhonchi, or wheezes  Cardio: RRR with Gr II/VI systolic murmur, no rub  GI: + BS, soft and nontender  Extremities: No edema  Access: AVG @ RUA with + bruit   Dialysis Orders: [facility] @ [facility]  4 hrs 93.5 kg 2K/2.25Ca 450/A1.5 Heparin 3000 U AVF @ R[facility]  Hectorol 4 mcg Epogen 9000 U Venofer 50 mg on Thurs  Assessment/Plan: 1. AMS - unclear etiology, improving, possible advanced dementia with progressive white matter disease per MRI 1/22, EEG 1/23 negative, CT 1/27 with no acute process; Neurology following.  2. Seizure disorder - Depakote switched to Vimpat after last hospitalization, now on hold to determine effect.  3. UTI - + for E coli, Morganella; on Rocephin.  4. ESRD - HD on TTS @ [place]; K 4.2, HD yesterday off schedule. Next HD on 2/5. 5. Hypertension/volume - BP 144/74 on Amlodipine 10 mg qd; wt 87.4 kg s/p net UF 1.3 L yesterday, well below EDW.  6. Anemia - Hgb down  to 8.1, weekly Fe, Aranesp 100 mcg on Thurs. Increase to 200 mcg.  7. Metabolic bone disease - Ca 8.5 (9.7 corrected), P 3.6; Hectorol 4 mcg, no binders. Nutrition - Alb 2.5; renal diet & vitamin.  8. DM Type 2 - diet-controlled    LOS: 8 days   [name] 10/09/2013,11:08 AM

## 2013-10-10 LAB — CULTURE, BLOOD (ROUTINE X 2)
Culture: NO GROWTH
Culture: NO GROWTH

## 2013-10-17 ENCOUNTER — Ambulatory Visit (INDEPENDENT_AMBULATORY_CARE_PROVIDER_SITE_OTHER): Payer: Medicare Other | Admitting: Diagnostic Neuroimaging

## 2013-10-17 ENCOUNTER — Encounter: Payer: Self-pay | Admitting: Diagnostic Neuroimaging

## 2013-10-17 VITALS — BP 112/64 | HR 79 | Temp 98.2°F

## 2013-10-17 DIAGNOSIS — F03A0 Unspecified dementia, mild, without behavioral disturbance, psychotic disturbance, mood disturbance, and anxiety: Secondary | ICD-10-CM | POA: Insufficient documentation

## 2013-10-17 DIAGNOSIS — F0390 Unspecified dementia without behavioral disturbance: Secondary | ICD-10-CM

## 2013-10-17 DIAGNOSIS — G40909 Epilepsy, unspecified, not intractable, without status epilepticus: Secondary | ICD-10-CM

## 2013-10-17 NOTE — Progress Notes (Signed)
[facility]  PATIENT: [name] DOB: Apr 10, 1932  REFERRING CLINICIAN:  HISTORY FROM: patient and daughter-in-law REASON FOR VISIT:  follow up   HISTORICAL  CHIEF COMPLAINT:  Chief Complaint  Patient presents with  . Follow-up    HISTORY OF PRESENT ILLNESS:   UPDATE 10/17/13: Since last visit, was admitted 09/25/13 - 09/29/13 for altered mental status,elevated VPA level (107.5). VPA was d/c's and vimpat 100mg  BID was started. Also was admitted 10/01/13 - 10/09/13 for metabolic encephalopathy. This time vimpat was decreased to 50mg  BID. No definite witnessed seizures. Now in rehab, trying to improve strength before returning home.  PRIOR HPI (02/09/13): 80 y.o. male with ESRD, on dialysis x 3 weeks, diabetes, hypertension, recent history of GI bleeding from arteriovenous malformations.  On 01/12/13 was in the bathroom at home he sustained a syncopal event. His wife was downstairs, she heard him fall and ran upstairs, she found him on the bathroom floor wedged between the wall and the commode. He was apparently awake but lethargic and somewhat confused. She then called EMS, upon EMS arrival patient started slowly coming around, he was then brought to the emergency room for further evaluation. While in the emergency room patient completely regained his usual mentation. CT of the head and C-spine was found to be negative, patient sustained a small laceration above his left eye.  Son reports that patient had second witnessed seizure in the hospital, where he had convulsions and LOC.  He was incontinent of urine without tongue-biting.  The episode lasted a few minutes.  Per the history obtained, patient has been in his usual state of health over the past few days to weeks. He does have some unsteady gait, but has not been excessively lethargic nor has he had any issues with nausea vomiting.Patient does not have a history of seizures in the past. He had not been on hemodialysis prior  to hospitalization although he has had several grafts placed in anticipation of starting HD. Patient now gets HD Tues, Thurs, Sat. Physical therapy on Monday, Wed. Friday.   REVIEW OF SYSTEMS: Full 14 system review of systems performed and notable only for bladder incontinence activity change fatigue cough memory loss.  ALLERGIES: No Known Allergies  HOME MEDICATIONS: Outpatient Prescriptions Prior to Visit  Medication Sig Dispense Refill  . amLODipine (NORVASC) 10 MG tablet Take 10 mg by mouth Daily.       . calcium carbonate (TUMS - DOSED IN MG ELEMENTAL CALCIUM) 500 MG chewable tablet Chew 1 tablet (200 mg of elemental calcium total) by mouth 3 (three) times daily with meals.  90 tablet  0  . darbepoetin (ARANESP) 60 MCG/0.3ML SOLN injection Inject 0.3 mLs (60 mcg total) into the vein every Thursday with hemodialysis.  4.2 mL    . doxercalciferol (HECTOROL) 4 MCG/2ML injection Inject 2 mLs (4 mcg total) into the vein Every Tuesday,Thursday,and Saturday with dialysis.  2 mL    . folic acid (FOLVITE) 1 MG tablet Take 1 tablet (1 mg total) by mouth daily.  30 tablet  0  . insulin aspart (NOVOLOG) 100 UNIT/ML injection 0-9 Units, Subcutaneous, 3 times daily with meals CBG < 70: implement hypoglycemia protocol CBG 70 - 120: 0 units CBG 121 - 150: 1 unit CBG 151 - 200: 2 units CBG 201 - 250: 3 units CBG 251 - 300: 5 units CBG 301 - 350: 7 units CBG 351 - 400: 9 units CBG > 400: call MD  10 mL  11  .  multivitamin (RENA-VIT) TABS tablet Take 1 tablet by mouth at bedtime.  30 tablet  0  . Nutritional Supplements (FEEDING SUPPLEMENT, NEPRO CARB STEADY,) LIQD Take 237 mLs by mouth 2 (two) times daily between meals.    0  . pantoprazole (PROTONIX) 20 MG tablet Take 40 mg by mouth 2 (two) times daily.       . sodium chloride 0.9 % SOLN 100 mL with ferric gluconate 12.5 MG/ML SOLN 62.5 mg Inject 62.5 mg into the vein every Thursday with hemodialysis.      . Lacosamide 100 MG TABS Take 0.5 tablets  (50 mg total) by mouth 2 (two) times daily.  60 tablet  0   No facility-administered medications prior to visit.    PAST MEDICAL HISTORY: Past Medical History  Diagnosis Date  . Hypertension   . Diabetes mellitus without complication   . Arthritis   . History of blood transfusion   . GI bleed   . ESRD (end stage renal disease)   . Seizure     PAST SURGICAL HISTORY: Past Surgical History  Procedure Laterality Date  . Esophagogastroduodenoscopy  10/03/2012    Procedure: ESOPHAGOGASTRODUODENOSCOPY (EGD);  Surgeon: Theda Belfast, MD;  Location: [facility] ENDOSCOPY;  Service: Endoscopy;  Laterality: N/A;  . Colonoscopy  10/04/2012    Procedure: COLONOSCOPY;  Surgeon: Theda Belfast, MD;  Location: [facility] ENDOSCOPY;  Service: Endoscopy;  Laterality: N/A;  . Colonoscopy with esophagogastroduodenoscopy (egd) Left 11/30/2012    Procedure: COLONOSCOPY WITH ESOPHAGOGASTRODUODENOSCOPY (EGD);  Surgeon: Theda Belfast, MD;  Location: [facility] ENDOSCOPY;  Service: Endoscopy;  Laterality: Left;    FAMILY HISTORY: Family History  Problem Relation Age of Onset  . Sudden death Mother   . Sudden death Father     SOCIAL HISTORY:  History   Social History  . Marital Status: Married    Spouse Name: patricia    Number of Children: 2  . Years of Education: PHD   Occupational History  . Retired    Social History Main Topics  . Smoking status: Never Smoker   . Smokeless tobacco: Never Used  . Alcohol Use: No  . Drug Use: No  . Sexual Activity: No   Other Topics Concern  . Not on file   Social History Narrative   Patient currently in [facility] rehab.   Caffeine Use: 1-2 cups occasionally     PHYSICAL EXAM  Filed Vitals:   10/17/13 1356  BP: 112/64  Pulse: 79  Temp: 98.2 F (36.8 C)  TempSrc: Oral    Not recorded    Cannot calculate BMI with a height equal to zero.  GENERAL EXAM: Patient is in no distress  CARDIOVASCULAR: Regular rate and rhythm, no murmurs, no  carotid bruits  NEUROLOGIC: MENTAL STATUS: awake, alert, language fluent, comprehension intact, naming intact; MMSE 19/30. POSITIVE SNOUT AND MYERSONS SIGNS. ORIENTED TO (YEAR = "66", MONTH = April, DAY = WED), WORLD BACKWARDS 1/5, RECALL 1/3. CANNOT DRAW PENTAGONS OR CLOCK, AFT 9. GDS 5. CRANIAL NERVE: pupils equal and reactive to light, visual fields full to confrontation, extraocular muscles intact, no nystagmus, facial sensation and strength symmetric, uvula midline, shoulder shrug symmetric, tongue midline. MOTOR: normal bulk and tone, full strength in the BUE, BLE SENSORY: ABSENT VIBRATION IN LOWER EXT COORDINATION: finger-nose-finger, fine finger movements normal REFLEXES: deep tendon reflexes present and symmetric, ABSENT BILAT KNEE AND ANKLE GAIT/STATION: narrow based gait; UNSTEADY ON RISING, romberg is negative, GAIT ASSISTED BY ROLLING WALKER.   DIAGNOSTIC  DATA (LABS, IMAGING, TESTING) - I reviewed patient records, labs, notes, testing and imaging myself where available.  Lab Results  Component Value Date   WBC 3.7* 10/09/2013   HGB 8.1* 10/09/2013   HCT 24.6* 10/09/2013   MCV 101.7* 10/09/2013   PLT 151 10/09/2013      Component Value Date/Time   NA 139 10/09/2013 0508   K 3.6* 10/09/2013 0508   CL 95* 10/09/2013 0508   CO2 24 10/09/2013 0508   GLUCOSE 140* 10/09/2013 0508   BUN 26* 10/09/2013 0508   CREATININE 4.61* 10/09/2013 0508   CALCIUM 8.5 10/09/2013 0508   PROT 7.0 10/05/2013 0546   ALBUMIN 2.5* 10/09/2013 0508   AST 18 10/05/2013 0546   ALT 8 10/05/2013 0546   ALKPHOS 39 10/05/2013 0546   BILITOT 0.5 10/05/2013 0546   GFRNONAA 11* 10/09/2013 0508   GFRAA 13* 10/09/2013 0508   No results found for this basename: CHOL,  HDL,  LDLCALC,  LDLDIRECT,  TRIG,  CHOLHDL   Lab Results  Component Value Date   HGBA1C 5.4 12/29/2012   Lab Results  Component Value Date   VITAMINB12 884 10/01/2013   Lab Results  Component Value Date   TSH 1.527 10/01/2013   Results for RUPERT, AZZARA (MRN  [id number]) as of 10/17/2013 15:29  Ref. Range 01/16/2013 00:15 01/17/2013 04:15 01/18/2013 06:56 09/25/2013 19:36 09/27/2013 11:20 09/28/2013 05:05  Valproic Acid Lvl Latest Range: 50.0-100.0 ug/mL 21.8 (L) 36.0 (L) 31.5 (L) 107.5 (H) 62.1 60.5    01/17/13 MRI head: No acute infarct. Tiny area of blood breakdown products posterior left temporal lobe, left frontal lobe and right occipital lobe may represent result of prior hemorrhagic ischemia although result of prior trauma not entirely excluded. Broad-based (5 mm thickness) left hemispheric subdural collection with cerebrospinal fluid intensity suggestive of cystic hygroma (versus chronic subdural hematoma). Prominent small vessel disease type changes. Global atrophy without hydrocephalus. Paranasal sinus mucosal thickening. Cervical spondylotic changes with spinal stenosis and slight cord flattening C3-4 and left so at the C2-3 level.  01/17/13 EEG: This is an abnormal EEG secondary to general background slowing. Although this can be seen with drowse, cannot rule out the possibility of diffuse cerebral disturbance that is etiologically nonspecific but may include a metabolic encephalopathy among other possibilities. Also noted was rare sharp activity over the left hemisphere.  10/06/13 MRI brain: moderate perisylvian and mesial temporal atrophy; moderate-severe chronic small vessel ischemic disease; numerous chronic cerebral microhemorrhages on SWAN views   ASSESSMENT AND PLAN  80 y.o. year old right-handed male  has a past medical history of Hypertension; Diabetes mellitus without complication; Arthritis; History of blood transfusion; GI bleed; ESRD (end stage renal disease); and Seizure. here with seizure x 2 in May 2014, in the setting of pericortical microhemorrhage in the left temporal lobe and left sided sharp waves on EEG.   Now with recurrent confusion, encephalopathy, MMSE 19/30, positive frontal release signs, suspicious for delirium on underlying  dementia. Had long discussion with patient and daughter-in-law about end of life planning and consideration for palliative care discussions with PCP and nephrology. Patient and daughter agree.  Dx: seizure disorder + mild dementia  PLAN: - continue vimpat 50mg  BID - consider palliative care / end-of-life planning with PCP, nephrology and/or palliative care team  Return in about 6 months (around 04/16/2014) for with 06/16/2014 or Mandy Fitzwater.    Heide Guile, MD 10/17/2013, 3:27 PM Certified in Neurology, Neurophysiology and Neuroimaging  [facility] [address]  [place], [place] [address] [phone]

## 2013-10-17 NOTE — Patient Instructions (Signed)
Consider palliative care consult.

## 2013-12-11 ENCOUNTER — Other Ambulatory Visit: Payer: Self-pay

## 2013-12-11 MED ORDER — LACOSAMIDE 50 MG PO TABS: 50.0000 mg | ORAL_TABLET | Freq: Two times a day (BID) | ORAL | Status: DC

## 2013-12-11 NOTE — Telephone Encounter (Signed)
Dr Marjory Lies is out of the office.  Forwarding request to [facility].

## 2013-12-12 NOTE — Telephone Encounter (Signed)
Rx has been faxed.

## 2013-12-17 ENCOUNTER — Telehealth: Payer: Self-pay | Admitting: *Deleted

## 2013-12-17 NOTE — Telephone Encounter (Signed)
Calling about shoes I had made.  I returned his call and informed him we're going to have to get authorization from his primary doctor again.  Authorization period has expired, can't dispense shoes nor inserts per Medicare guidelines.  I told him we'll let him know once it's authorized again.  He said he was in the hospital for a while and had to go to rehabilitation to learn to walk again.

## 2014-01-18 ENCOUNTER — Ambulatory Visit: Payer: Medicare Other

## 2014-01-30 ENCOUNTER — Telehealth: Payer: Self-pay | Admitting: *Deleted

## 2014-01-30 NOTE — Telephone Encounter (Signed)
Calling to see if you heard from my doctor considering my shoes.  Thank you.  I returned his call and informed him we have not received the required forms yet.  I explained to him that the card the doctor signed is not suffice per Medicare guidelines.  I sent the necessary forms to your doctor but [facility] has not received them yet.  He stated okay thanks, I been messing around with these shoes a long time.  I was in the hospital for a long while and wasn't able to come over here to pick them up.  I told him we would call once we get the approval.

## 2014-02-11 ENCOUNTER — Telehealth: Payer: Self-pay | Admitting: *Deleted

## 2014-02-11 NOTE — Telephone Encounter (Signed)
Someone returned my call.  I told him yes, I left him a message that shoes have not been authorized by his Diabetic doctor not dialysis doctor.  He stated I just may go ahead and buy them instead of continuing to wait on the doctors.  My health is more important.  I need shoes to get out and walk.  How much do they cost?  I told him they are expensive.  I told him they are $354 for the shoes and the inserts.  He stated he may wait a little longer but if he doesn't hear anything soon he may just go ahead and buy them.  I told him to just let us know.

## 2014-02-11 NOTE — Telephone Encounter (Signed)
I attempted to return the patient's call.  I left a message that we have not received authorization for the diabetic shoes from Dr. Parke Simmers nor Dialysis doctor.  If you have any further questions please give me a call back.

## 2014-02-18 ENCOUNTER — Telehealth: Payer: Self-pay | Admitting: *Deleted

## 2014-02-18 NOTE — Telephone Encounter (Signed)
I'd like to know if you've heard from Dr. Parke Simmers in relation to my shoes?  I returned his call and informed him that Dr. Parke Simmers has not responded to the request for the shoes.  He stated he called Dr. Parke Simmers today and they said she is working on it.

## 2014-02-27 ENCOUNTER — Telehealth: Payer: Self-pay | Admitting: *Deleted

## 2014-02-27 NOTE — Telephone Encounter (Signed)
I want to find out about the status of the information about my shoes.  I called and left him a message that we have not received authorization for the shoes.  I advised him to contact Dr. Tedra Senegal office to see if she will sign the forms.  Call if you have any further questions.

## 2014-02-28 ENCOUNTER — Telehealth: Payer: Self-pay | Admitting: *Deleted

## 2014-02-28 NOTE — Telephone Encounter (Signed)
Returning the call to the nurse.  I called and spoke to the patient's wife.  I told her I was returning his call.  I explained to her that there is nothing we can do about the shoes.  The form has to be signed by Dr. Parke Simmers.  She stated that Dr. Parke Simmers told him she had filled the forms out.  I told her she did previously but due to the patient being in the hospital he missed the deadline to pick them up.  Therefore, we had to start the process over again.  I told her he can come by and get the forms and take them to Dr. Parke Simmers and get them signed if he like.  She said she would let him know.

## 2014-03-01 ENCOUNTER — Telehealth: Payer: Self-pay | Admitting: *Deleted

## 2014-03-01 NOTE — Telephone Encounter (Signed)
I need to make an appointment to pick up my shoes.  I returned his call.  He stated I need to schedule an appointment to pick my shoes up.  Someone called and left me a message that you received my paperwork.  I told him no, I spoke to your wife and informed her we have not received the paperwork.  I told her you can come by the office and pick up the paperwork and take it to Dr. Tedra Senegal office and see if she will fill them out.  He stated he has already tried that and they told him they would.  He asked how much they cost.  I told him for the shoes and the inserts, it's $300 plus.  I told him I could transfer him to [place] at checkout.  He stated no that's all right, I'll try Dr. Parke Simmers again.

## 2014-03-13 ENCOUNTER — Ambulatory Visit (INDEPENDENT_AMBULATORY_CARE_PROVIDER_SITE_OTHER): Payer: Medicare Other

## 2014-03-13 VITALS — BP 156/69 | HR 69 | Resp 12

## 2014-03-13 DIAGNOSIS — E1140 Type 2 diabetes mellitus with diabetic neuropathy, unspecified: Secondary | ICD-10-CM

## 2014-03-13 DIAGNOSIS — Q828 Other specified congenital malformations of skin: Secondary | ICD-10-CM

## 2014-03-13 DIAGNOSIS — M2040 Other hammer toe(s) (acquired), unspecified foot: Secondary | ICD-10-CM

## 2014-03-13 DIAGNOSIS — E1142 Type 2 diabetes mellitus with diabetic polyneuropathy: Secondary | ICD-10-CM

## 2014-03-13 DIAGNOSIS — E1149 Type 2 diabetes mellitus with other diabetic neurological complication: Secondary | ICD-10-CM

## 2014-03-13 NOTE — Progress Notes (Signed)
   Subjective:    Patient ID: [name], male    DOB: 07/20/1932, 80 y.o.   MRN: [id number]  HPI PICK UP DIABETIC SHOES AND GIVEN INSTRUCTION.   Review of Systems no new findings or systemic changes noted should note patient has been in the hospital multiple times to history of seizures or strokelike events walking with the assistance of a cane at this is now back home after release from rehabilitation facility     Objective:   Physical Exam 80 year old [place] male well-developed well-nourished oriented x3 presents at this time for diabetic shoe pick up and fitting the shoes were ordered on many months ago inpatient in that time had been hospitalized and complications seizures and difficulties associated with his diabetes and vascular issues. Patient presents at this time continues to have thready pulses DP plus one over 4 PT plus one over 4 bilateral capillary refill timed 3-4 seconds all digits skin temperature warm turgor diminished, mild edema bilateral +1 pitting. Neurologically epicritic and proprioceptive sensations decreased on Semmes Weinstein testing to forefoot digits and arch history of previous ulcerations and complications is rigid contractures the hallux as well as lesser digits 2 through 5 bilateral no active ulcers no active infection is noted current time nails somewhat dark and discolored friable brittle consistent with onychomycosis and friability they have been debrided as he was in a rehabilitation facility however may need debridement in the future for palliative care at this time dispensed 1 pair shoes and 3 pairs of custom molded dual density Plastizote inlays which fit and contour well to the foot with full contact written instructions for shoewear break in and fit are dispensed       Assessment & Plan:  Assessment diabetes with digital contractures peripheral neuropathy and complications shoes and custom molded inlays are dispensed at this time with break in wearing  instructions followup within the next one to 2 months for future palliative care is needed  Alvan Dame DPM

## 2014-04-22 ENCOUNTER — Ambulatory Visit (INDEPENDENT_AMBULATORY_CARE_PROVIDER_SITE_OTHER): Payer: Medicare Other | Admitting: Nurse Practitioner

## 2014-04-22 ENCOUNTER — Encounter: Payer: Self-pay | Admitting: Nurse Practitioner

## 2014-04-22 VITALS — BP 135/67 | HR 80 | Ht 72.0 in | Wt 199.0 lb

## 2014-04-22 DIAGNOSIS — F0390 Unspecified dementia without behavioral disturbance: Secondary | ICD-10-CM

## 2014-04-22 DIAGNOSIS — F03A0 Unspecified dementia, mild, without behavioral disturbance, psychotic disturbance, mood disturbance, and anxiety: Secondary | ICD-10-CM

## 2014-04-22 DIAGNOSIS — G40909 Epilepsy, unspecified, not intractable, without status epilepticus: Secondary | ICD-10-CM

## 2014-04-22 MED ORDER — LACOSAMIDE 50 MG PO TABS: 50.0000 mg | ORAL_TABLET | Freq: Two times a day (BID) | ORAL | Status: DC

## 2014-04-22 NOTE — Patient Instructions (Addendum)
PLAN:  - continue vimpat 50mg  twice daily.  Return in about 6 monthsfor with Dr. , sooner as needed.

## 2014-04-22 NOTE — Progress Notes (Signed)
PATIENT: [name] DOB: 1931-10-31  REASON FOR VISIT: routine follow up for seizure disorder, memory HISTORY FROM: patient, son  HISTORY OF PRESENT ILLNESS: UPDATE 04/22/14 (LL):  Since last visit, patient is much improved. No seizures, left rehab and living again at home with his wife.  Mood much better. Cognition better. Reportedly taking less fluid off at hemodialysis. Tolerating vimpat without side effects.  UPDATE 10/17/13: Since last visit, was admitted 09/25/13 - 09/29/13 for altered mental status,elevated VPA level (107.5). VPA was d/c's and vimpat 100mg  BID was started. Also was admitted 10/01/13 - 10/09/13 for metabolic encephalopathy. This time vimpat was decreased to 50mg  BID. No definite witnessed seizures. Now in rehab, trying to improve strength before returning home.  PRIOR HPI (02/09/13): 80 y.o. male with ESRD, on dialysis x 3 weeks, diabetes, hypertension, recent history of GI bleeding from arteriovenous malformations. On 01/12/13 was in the bathroom at home he sustained a syncopal event. His wife was downstairs, she heard him fall and ran upstairs, she found him on the bathroom floor wedged between the wall and the commode. He was apparently awake but lethargic and somewhat confused. She then called EMS, upon EMS arrival patient started slowly coming around, he was then brought to the emergency room for further evaluation. While in the emergency room patient completely regained his usual mentation. CT of the head and C-spine was found to be negative, patient sustained a small laceration above his left eye. Son reports that patient had second witnessed seizure in the hospital, where he had convulsions and LOC. He was incontinent of urine without tongue-biting. The episode lasted a few minutes.  Per the history obtained, patient has been in his usual state of health over the past few days to weeks. He does have some unsteady gait, but has not been excessively lethargic nor has he had any  issues with nausea vomiting.Patient does not have a history of seizures in the past. He had not been on hemodialysis prior to hospitalization although he has had several grafts placed in anticipation of starting HD. Patient now gets HD Tues, Thurs, Sat. Physical therapy on Monday, Wed. Friday.   REVIEW OF SYSTEMS: Full 14 system review of systems performed and notable only for bladder incontinence.  ALLERGIES: No Known Allergies  HOME MEDICATIONS: Outpatient Prescriptions Prior to Visit  Medication Sig Dispense Refill  . amLODipine (NORVASC) 10 MG tablet Take 10 mg by mouth Daily.       . calcium carbonate (TUMS - DOSED IN MG ELEMENTAL CALCIUM) 500 MG chewable tablet Chew 1 tablet (200 mg of elemental calcium total) by mouth 3 (three) times daily with meals.  90 tablet  0  . darbepoetin (ARANESP) 60 MCG/0.3ML SOLN injection Inject 0.3 mLs (60 mcg total) into the vein every Thursday with hemodialysis.  4.2 mL    . doxercalciferol (HECTOROL) 4 MCG/2ML injection Inject 2 mLs (4 mcg total) into the vein Every Tuesday,Thursday,and Saturday with dialysis.  2 mL    . folic acid (FOLVITE) 1 MG tablet Take 1 tablet (1 mg total) by mouth daily.  30 tablet  0  . insulin aspart (NOVOLOG) 100 UNIT/ML injection 0-9 Units, Subcutaneous, 3 times daily with meals CBG < 70: implement hypoglycemia protocol CBG 70 - 120: 0 units CBG 121 - 150: 1 unit CBG 151 - 200: 2 units CBG 201 - 250: 3 units CBG 251 - 300: 5 units CBG 301 - 350: 7 units CBG 351 - 400: 9 units CBG > 400:  call MD  10 mL  11  . multivitamin (RENA-VIT) TABS tablet Take 1 tablet by mouth at bedtime.  30 tablet  0  . Nutritional Supplements (FEEDING SUPPLEMENT, NEPRO CARB STEADY,) LIQD Take 237 mLs by mouth 2 (two) times daily between meals.    0  . pantoprazole (PROTONIX) 20 MG tablet Take 40 mg by mouth 2 (two) times daily.       . sodium chloride 0.9 % SOLN 100 mL with ferric gluconate 12.5 MG/ML SOLN 62.5 mg Inject 62.5 mg into the vein  every Thursday with hemodialysis.      Marland Kitchen lacosamide (VIMPAT) 50 MG TABS tablet Take 1 tablet (50 mg total) by mouth 2 (two) times daily.  60 tablet  5   No facility-administered medications prior to visit.    PHYSICAL EXAM Filed Vitals:   04/22/14 1040  BP: 135/67  Pulse: 80  Height: 6' (1.829 m)  Weight: 199 lb (90.266 kg)   Body mass index is 26.98 kg/(m^2).  MMSE - Mini Mental State Exam 04/22/2014 10/17/2013  Orientation to time 4 2  Orientation to Place 5 3  Registration 3 3  Attention/ Calculation 3 1  Recall 2 1  Language- name 2 objects 2 2  Language- repeat 1 1  Language- follow 3 step command 3 3  Language- read & follow direction 1 1  Write a sentence 1 1  Copy design 1 1  Total score 26 19   Generalized: Well developed, pleasant elderly AA male in no acute distress  Head: normocephalic and atraumatic. Oropharynx benign. TARDIVE DYSKINESIA: REPETITIVE LIP SMACKING Neck: Supple, no carotid bruits  Cardiac: Regular rate rhythm, 2/6 systolic murmur  Musculoskeletal: No deformity   NEUROLOGIC:  MENTAL STATUS: awake, alert, language fluent, comprehension intact, naming intact;  POSITIVE SNOUT AND MYERSONS SIGNS.  Clock drawing 3/4, AFT 9. GDS 2.  CRANIAL NERVE: pupils equal and reactive to light, visual fields full to confrontation, extraocular muscles intact, no nystagmus, facial sensation and strength symmetric, uvula midline, shoulder shrug symmetric, tongue midline.  MOTOR: normal bulk and tone, full strength in the BUE, BLE  SENSORY: ABSENT VIBRATION IN LOWER EXT  COORDINATION: finger-nose-finger, fine finger movements normal  REFLEXES: deep tendon reflexes present and symmetric, ABSENT BILAT KNEE AND ANKLE  GAIT/STATION: narrow based gait; UNSTEADY ON RISING, romberg is negative, GAIT ASSISTED BY CANE.  01/17/13 MRI head: No acute infarct. Tiny area of blood breakdown products posterior left temporal lobe, left frontal lobe and right occipital lobe may represent  result of prior hemorrhagic ischemia although result of prior trauma not entirely excluded. Broad-based (5 mm thickness) left hemispheric subdural collection with cerebrospinal fluid intensity suggestive of cystic hygroma (versus chronic subdural hematoma). Prominent small vessel disease type changes. Global atrophy without hydrocephalus. Paranasal sinus mucosal thickening. Cervical spondylotic changes with spinal stenosis and slight cord flattening C3-4 and left so at the C2-3 level.   01/17/13 EEG: This is an abnormal EEG secondary to general background slowing. Although this can be seen with drowse, cannot rule out the possibility of diffuse cerebral disturbance that is etiologically nonspecific but may include a metabolic encephalopathy among other possibilities. Also noted was rare sharp activity over the left hemisphere.   10/06/13 MRI brain: moderate perisylvian and mesial temporal atrophy; moderate-severe chronic small vessel ischemic disease; numerous chronic cerebral microhemorrhages on SWAN views   ASSESSMENT: 80 y.o. year old right-handed AA male has a past medical history of Hypertension; Diabetes mellitus without complication; Arthritis; History of blood transfusion;  GI bleed; ESRD (end stage renal disease); and Seizure here with seizure x 2 in May 2014, in the setting of pericortical microhemorrhage in the left temporal lobe and left sided sharp waves on EEG. Cogintion and mood much improved since last visit.  Dx: seizure disorder + mild dementia   PLAN:  - continue vimpat 50mg  BID.  Meds ordered this encounter  Medications  . lacosamide (VIMPAT) 50 MG TABS tablet    Sig: Take 1 tablet (50 mg total) by mouth 2 (two) times daily.    Dispense:  60 tablet    Refill:  5    Pharmacy Fax [phone]    Order Specific Question:  Supervising Provider    Answer:  [phone] [[id number]]   Return in about 6 months (around 10/23/2014) for seizure disorder, mild dementia.  10/25/2014 Lakima Dona, MSN,  FNP-BC, A/GNP-C 04/22/2014, 11:34 AM [facility] [address] [place], [place] [phone]  Note: This document was prepared with digital dictation and possible smart phrase technology. Any transcriptional errors that result from this process are unintentional.

## 2014-04-25 NOTE — Progress Notes (Signed)
I reviewed note and agree with plan.   Hadyn Blanck R. Danyle Boening, MD  Certified in Neurology, Neurophysiology and Neuroimaging  [facility] [address], [address] [place], [place] [address] [phone]   

## 2014-04-29 ENCOUNTER — Emergency Department (HOSPITAL_COMMUNITY)
Admission: EM | Admit: 2014-04-29 | Discharge: 2014-04-29 | Discharge status: Against Medical Advice | Payer: Medicare Other | Attending: Emergency Medicine | Admitting: Emergency Medicine

## 2014-04-29 ENCOUNTER — Encounter (HOSPITAL_COMMUNITY): Payer: Self-pay | Admitting: Emergency Medicine

## 2014-04-29 DIAGNOSIS — R7309 Other abnormal glucose: Secondary | ICD-10-CM | POA: Diagnosis present

## 2014-04-29 LAB — CBG MONITORING, ED: GLUCOSE-CAPILLARY: 152 mg/dL — AB (ref 70–99)

## 2014-04-29 NOTE — ED Notes (Signed)
Pt stating he is leaving due to wait, encouraged to stay

## 2014-04-29 NOTE — ED Notes (Addendum)
Pt in stating he has not been able to get his normal insulin pen for the last few days, so he has not had insulin in two days, reports generalized weakness- pt states his pharmacy told him to come here because he needs a new prescription for the medication, no distress noted. Pt is a dialysis patient and had normal treatment today.

## 2014-05-15 ENCOUNTER — Ambulatory Visit (INDEPENDENT_AMBULATORY_CARE_PROVIDER_SITE_OTHER): Payer: Medicare Other

## 2014-05-15 VITALS — BP 158/75 | HR 78 | Resp 16 | Ht 72.0 in | Wt 199.0 lb

## 2014-05-15 DIAGNOSIS — B351 Tinea unguium: Secondary | ICD-10-CM

## 2014-05-15 DIAGNOSIS — M79609 Pain in unspecified limb: Secondary | ICD-10-CM

## 2014-05-15 DIAGNOSIS — M79676 Pain in unspecified toe(s): Secondary | ICD-10-CM

## 2014-05-15 DIAGNOSIS — E1142 Type 2 diabetes mellitus with diabetic polyneuropathy: Secondary | ICD-10-CM

## 2014-05-15 DIAGNOSIS — E1140 Type 2 diabetes mellitus with diabetic neuropathy, unspecified: Secondary | ICD-10-CM

## 2014-05-15 DIAGNOSIS — E1149 Type 2 diabetes mellitus with other diabetic neurological complication: Secondary | ICD-10-CM

## 2014-05-15 LAB — HM DIABETES FOOT EXAM

## 2014-05-15 NOTE — Patient Instructions (Signed)

## 2014-05-15 NOTE — Progress Notes (Signed)
   Subjective:    Patient ID: [name], male    DOB: [birth year], 80 y.o.   MRN: [id number]  HPI Comments: Pt states he likes the new diabetic shoes he received on 03/13/2014.  Pt requested the trimming of the toenails and foot exam.     Review of Systems no new findings or systemic changes noted     Objective:   Physical Exam Lower extremity objective findings vascular status is intact pedal pulses are palpable DP and PT plus one over 4 bilateral capillary refill time 4 seconds all digits epicritic sensations diminished on Semmes Weinstein to the forefoot digits has had the ulceration of the right hallux there is some residual nailbed present with some hemorrhage keratoses the hallux nailbed right remaining nails thick brittle friable criptotic incurvated 1 through 5 left 2 through 5 right. No open wounds or ulcers no secondary infections. Patient wearing diabetic shoes with in Plastizote inlays a fit and contour well no open wounds or ulcers and no new complaints at this time       Assessment & Plan:  Assessment this time his diabetes with contracture digits peripheral neuropathy and onychomycosis of nails painful mycotic dystrophic brittle nails x9 are debrided at this time the presence of pain in symptomology as well as diabetes and complications return for future palliative care every 3 months as recommended  Alvan Dame DPM

## 2014-08-13 ENCOUNTER — Ambulatory Visit: Payer: Medicare Other

## 2014-08-15 ENCOUNTER — Encounter: Payer: Self-pay | Admitting: *Deleted

## 2014-08-15 ENCOUNTER — Encounter: Payer: Self-pay | Admitting: Internal Medicine

## 2014-08-15 ENCOUNTER — Ambulatory Visit (INDEPENDENT_AMBULATORY_CARE_PROVIDER_SITE_OTHER): Payer: Medicare Other | Admitting: Internal Medicine

## 2014-08-15 VITALS — BP 138/72 | HR 78 | Temp 97.9°F | Resp 18 | Ht 70.0 in | Wt 202.6 lb

## 2014-08-15 DIAGNOSIS — D631 Anemia in chronic kidney disease: Secondary | ICD-10-CM

## 2014-08-15 DIAGNOSIS — M51369 Other intervertebral disc degeneration, lumbar region without mention of lumbar back pain or lower extremity pain: Secondary | ICD-10-CM | POA: Insufficient documentation

## 2014-08-15 DIAGNOSIS — N186 End stage renal disease: Secondary | ICD-10-CM

## 2014-08-15 DIAGNOSIS — N2581 Secondary hyperparathyroidism of renal origin: Secondary | ICD-10-CM

## 2014-08-15 DIAGNOSIS — I150 Renovascular hypertension: Secondary | ICD-10-CM

## 2014-08-15 DIAGNOSIS — E119 Type 2 diabetes mellitus without complications: Secondary | ICD-10-CM

## 2014-08-15 DIAGNOSIS — Z992 Dependence on renal dialysis: Secondary | ICD-10-CM

## 2014-08-15 DIAGNOSIS — Z9181 History of falling: Secondary | ICD-10-CM

## 2014-08-15 DIAGNOSIS — R296 Repeated falls: Secondary | ICD-10-CM

## 2014-08-15 DIAGNOSIS — M10042 Idiopathic gout, left hand: Secondary | ICD-10-CM

## 2014-08-15 DIAGNOSIS — M5136 Other intervertebral disc degeneration, lumbar region: Secondary | ICD-10-CM

## 2014-08-15 DIAGNOSIS — G40909 Epilepsy, unspecified, not intractable, without status epilepticus: Secondary | ICD-10-CM

## 2014-08-15 DIAGNOSIS — N189 Chronic kidney disease, unspecified: Secondary | ICD-10-CM

## 2014-08-15 MED ORDER — GABAPENTIN 100 MG PO CAPS: 100.0000 mg | ORAL_CAPSULE | Freq: Three times a day (TID) | ORAL | Status: DC

## 2014-08-15 NOTE — Progress Notes (Signed)
Patient ID: [name], male   DOB: 07/27/1932, 80 y.o.   MRN: [id number]   Location:  [facility]  Code Status: does not have advance directive.  Is willing to do one. His wife has hers.  No Known Allergies  Chief Complaint  Patient presents with  . Establish Care    HPI: Patient is a 80 y.o. black male seen in the office today to establish with the practice.    His son [name] found [place] online for geriatric care.    His primary concern is hs back pain radiating down to his legs.  Saw orthopedist and nothing could be done.  Surgery not an option and may make it worse.  Had degenerative disc disease.  Had prior spinal fusion.  Did well with therapy.    Needs to see a dentist.  Had a fall out of the tub around May of last year and chipped some of his teeth.  Didn't know what was going on after that.  Says he had a seizure.  Unclear if he fell first or had the seizure and fell and broke teeth.  Had a gash over his eye, as well.  May have also had a concussion.  Has shower with grab bars now and supervised by his wife.  Has had a few other falls.  Has a lift chair now.  Was having most falls before he got that, would start pushing off and keep on going.  Went to rehab after his fall from seizure.  Had a UTI with delirium and was unable to use restroom on his own or take care of himself.  He had not significant memory problems.  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  Memory has been better since the time of the seizure episode and UTI.  Got dialyzed daily at first when this happened.  No further seizures since that hospitalization.  Has been on vimpat.    At HD, staff had to help him to get up and steadied.  Has been going there for just over a year also--Dr. Kathrene Bongo.  Has difficulty staying in the position for the treatment.  Gets cramps in legs and abdomen.  Sounds like his volume removed was reduced since then.  BP also had been  dropping.  No nausea.  Also goes to [facility].  Dr. Danae Orleans.    2 weeks ago had a gout flare.  Previously had in the hospital.  Involved left hand.  Does not take any preventive treatment.  Uses colchicine prn.    Stopped GERD medication.  No further problems.  Hasn't been back to Dr. Elnoria Howard.  Had UGIB .  Had EGD, c scope with old angiodysplastic lesion and ulcer that was clipped 3/14 , plus hemorrhoids.  Had capsule endoscopy which was unremarkable.  Dr. Elnoria Howard said no asa.  Did have some difficulty with bleeding from his port at HD due to heparin.  Cannot take any heparin.    Diabetes:  Has had for a long time--at least since 1999 or 2000.  Had staph infection and was hospitalized.  Sugars had been doing very well.  Mealtime insulin not working so well.  Added extra.  Has been hitting 200 recently.  Would never go up when exercised.  Used to get lows with exercise.  Novolog dose varies considerably based on sweets intake.  Checks his cbgs 3x per day before meals.    Has allergies to mold, dogs, cats.  Rarely uses the flexeril for muscle spasms.    Review of Systems:  Review of Systems  Constitutional: Negative for fever, chills and weight loss.  HENT: Negative for congestion.   Eyes: Negative for blurred vision.       Glasses  Respiratory: Negative for shortness of breath.   Cardiovascular: Negative for chest pain and palpitations.  Gastrointestinal: Positive for constipation. Negative for heartburn, nausea and abdominal pain.       Better since volume taken off was decreased  Genitourinary: Negative for dysuria, urgency and frequency.  Musculoskeletal: Positive for myalgias, back pain and falls.  Skin: Negative for rash.  Neurological: Positive for dizziness and weakness.  Endo/Heme/Allergies: Positive for environmental allergies. Does not bruise/bleed easily.       Diabetes  Psychiatric/Behavioral: Positive for memory loss. Negative for depression.    Past Medical History   Diagnosis Date  . Hypertension   . Diabetes mellitus without complication   . Arthritis   . History of blood transfusion   . GI bleed   . ESRD (end stage renal disease)   . Seizure   . Dialysis patient     Past Surgical History  Procedure Laterality Date  . Esophagogastroduodenoscopy  10/03/2012    Procedure: ESOPHAGOGASTRODUODENOSCOPY (EGD);  Surgeon: Theda Belfast, MD;  Location: [facility]COPY;  Service: Endoscopy;  Laterality: N/A;  . Colonoscopy  10/04/2012    Procedure: COLONOSCOPY;  Surgeon: Theda Belfast, MD;  Location: [facility] ENDOSCOPY;  Service: Endoscopy;  Laterality: N/A;  . Colonoscopy with esophagogastroduodenoscopy (egd) Left 11/30/2012    Procedure: COLONOSCOPY WITH ESOPHAGOGASTRODUODENOSCOPY (EGD);  Surgeon: Theda Belfast, MD;  Location: [facility] ENDOSCOPY;  Service: Endoscopy;  Laterality: Left;  . Spine surgery  2004    back fusion/ [facility] Penumalli,MD    Social History:   reports that he has never smoked. He has never used smokeless tobacco. He reports that he does not drink alcohol or use illicit drugs.  Family History  Problem Relation Age of Onset  . Sudden death Mother   . Sudden death Father     Medications: Patient's Medications  New Prescriptions   No medications on file  Previous Medications   ACCU-CHEK AVIVA PLUS TEST STRIP       AMLODIPINE (NORVASC) 10 MG TABLET    Take 10 mg by mouth Daily.    CALCIUM ACETATE (PHOSLO) 667 MG CAPSULE    Take 667 mg by mouth 3 (three) times daily.   COLCHICINE 0.6 MG TABLET       CYCLOBENZAPRINE (FLEXERIL) 5 MG TABLET    Take 5 mg by mouth daily as needed.   DARBEPOETIN (ARANESP) 60 MCG/0.3ML SOLN INJECTION    Inject 0.3 mLs (60 mcg total) into the vein every Thursday with hemodialysis.   DOXERCALCIFEROL (HECTOROL) 4 MCG/2ML INJECTION    Inject 2 mLs (4 mcg total) into the vein Every Tuesday,Thursday,and Saturday with dialysis.   FOLIC ACID (FOLVITE) 1 MG TABLET    Take 1 tablet (1 mg total) by mouth daily.   INSULIN  ASPART (NOVOLOG) 100 UNIT/ML INJECTION    0-9 Units, Subcutaneous, 3 times daily with meals CBG < 70: implement hypoglycemia protocol CBG 70 - 120: 0 units CBG 121 - 150: 1 unit CBG 151 - 200: 2 units CBG 201 - 250: 3 units CBG 251 - 300: 5 units CBG 301 - 350: 7 units CBG 351 - 400: 9 units CBG > 400: call MD   LACOSAMIDE (VIMPAT) 50 MG TABS TABLET  Take 1 tablet (50 mg total) by mouth 2 (two) times daily.   MULTIVITAMIN (RENA-VIT) TABS TABLET    Take 1 tablet by mouth at bedtime.   PANTOPRAZOLE (PROTONIX) 20 MG TABLET    Take 40 mg by mouth 2 (two) times daily.    SODIUM CHLORIDE 0.9 % SOLN 100 ML WITH FERRIC GLUCONATE 12.5 MG/ML SOLN 62.5 MG    Inject 62.5 mg into the vein every Thursday with hemodialysis.  Modified Medications   No medications on file  Discontinued Medications   CALCIUM CARBONATE (TUMS - DOSED IN MG ELEMENTAL CALCIUM) 500 MG CHEWABLE TABLET    Chew 1 tablet (200 mg of elemental calcium total) by mouth 3 (three) times daily with meals.   NUTRITIONAL SUPPLEMENTS (FEEDING SUPPLEMENT, NEPRO CARB STEADY,) LIQD    Take 237 mLs by mouth 2 (two) times daily between meals.     Physical Exam: Filed Vitals:   08/15/14 0914  BP: 138/72  Pulse: 78  Temp: 97.9 F (36.6 C)  TempSrc: Oral  Resp: 18  Height: 5\' 10"  (1.778 m)  Weight: 202 lb 9.6 oz (91.899 kg)  SpO2: 95%  Physical Exam  Constitutional: He is oriented to person, place, and time. He appears well-developed and well-nourished. No distress.  HENT:  Head: Normocephalic and atraumatic.  Cardiovascular: Intact distal pulses.   Murmur heard. Pulmonary/Chest: Effort normal and breath sounds normal. No respiratory distress.  Abdominal: Soft. Bowel sounds are normal.  Musculoskeletal: Normal range of motion. He exhibits tenderness.  Stooped posture; tender over sacroiliac area and lower lumbar spine  Neurological: He is alert and oriented to person, place, and time.  Skin: Skin is warm and dry.  Psychiatric: He  has a normal mood and affect.   Labs reviewed: Basic Metabolic Panel:  Recent Labs  2256  10/05/13 0546 10/07/13 0615 10/08/13 1444 10/09/13 0508  NA  --   < > 138  138 134* 135* 139  K  --   < > 3.8  3.8 4.2 4.1 3.6*  CL  --   < > 95*  95* 91* 93* 95*  CO2  --   < > 26  26 24 25 24   GLUCOSE  --   < > 170*  170* 147* 188* 140*  BUN  --   < > 25*  25* 40* 46* 26*  CREATININE  --   < > 4.69*  4.76* 6.29* 6.16* 4.61*  CALCIUM  --   < > 9.2  9.1 9.3 8.7 8.5  PHOS  --   < > 4.4  --  3.8 3.6  TSH 1.527  --   --   --   --   --   < > = values in this interval not displayed. Liver Function Tests:  Recent Labs  09/25/13 1900  10/01/13 1515  10/05/13 0546 10/08/13 1444 10/09/13 0508  AST 13  --  13  --  18  --   --   ALT 5  --  5  --  8  --   --   ALKPHOS 40  --  42  --  39  --   --   BILITOT 0.3  --  0.4  --  0.5  --   --   PROT 7.4  --  6.9  --  7.0  --   --   ALBUMIN 3.4*  < > 3.0*  < > 2.7*  2.7* 2.5* 2.5*  < > = values in this  interval not displayed. No results for input(s): LIPASE, AMYLASE in the last 8760 hours.  Recent Labs  10/01/13 2256  AMMONIA 20   CBC:  Recent Labs  10/01/13 1515  10/07/13 0615 10/08/13 1444 10/09/13 0508  WBC 5.9  < > 5.6 3.1* 3.7*  NEUTROABS 3.9  --   --   --   --   HGB 9.6*  < > 8.5* 7.9* 8.1*  HCT 29.0*  < > 25.4* 23.0* 24.6*  MCV 105.5*  < > 101.2* 98.7 101.7*  PLT 82*  < > 149* 153 151  < > = values in this interval not displayed. Lipid Panel: No results for input(s): CHOL, HDL, LDLCALC, TRIG, CHOLHDL, LDLDIRECT in the last 8760 hours. Lab Results  Component Value Date   HGBA1C 5.4 12/29/2012    Assessment/Plan 1. Degenerative disc disease, lumbar -was his biggest complaint today -appears he had some delirium when he received narcotics before--family members do not want him back on them -will try low dose gabapentin which will need to be bid if dose gets increased - gabapentin (NEURONTIN) 100 MG  capsule; Take 1 capsule (100 mg total) by mouth 3 (three) times daily.  Dispense: 90 capsule; Refill: 3  2. Renovascular hypertension - bp at goal, cont current meds, monitor for orthostasis - Comprehensive metabolic panel  3. Secondary hyperparathyroidism, renal -followed by renal-cont renavite, vitamin D3 - Comprehensive metabolic panel  4. Diabetes type 2, controlled -recently having some glucose levels in 200s, but also some lows--needs to bring glucometer for next eval - Comprehensive metabolic panel - Hemoglobin A1c  5. Seizure disorder -cont vimpat, no seizures since starting medication during hospitalization  6. Personal history of falling, presenting hazards to health -uses cane only -would benefit from walker use  7. ESRD on dialysis -continues HD with Dr. Kathrene Bongo  8. Idiopathic gout of left hand, unspecified chronicity -left hand--has had two episodes, likely due to his renal failue - Uric Acid  9. Anemia in chronic kidney disease -cont epo and iron at HD as per renal - CBC With differential/Platelet - Comprehensive metabolic panel  Labs/tests ordered:   Orders Placed This Encounter  Procedures  . CBC With differential/Platelet  . Comprehensive metabolic panel  . Hemoglobin A1c  . Uric Acid  could not check lipids (ate breakfast)  Next appt:  2 mos with annual exam with mmse  Tocara Mennen L. Anabelle Bungert, D.O. Geriatrics [facility] [address], [place] [address] Cell Phone (Mon-Fri 8am-5pm):  [phone] On Call:  [phone] & follow prompts after 5pm & weekends Office Phone:  [phone] Office Fax:  [phone]

## 2014-08-15 NOTE — Patient Instructions (Signed)
Please bring your glucometer to your next appointment so I can review your sugars.

## 2014-08-16 LAB — COMPREHENSIVE METABOLIC PANEL
ALT: 15 IU/L (ref 0–44)
AST: 24 IU/L (ref 0–40)
Albumin/Globulin Ratio: 1.7 (ref 1.1–2.5)
Albumin: 4.5 g/dL (ref 3.5–4.7)
Alkaline Phosphatase: 56 IU/L (ref 39–117)
BUN/Creatinine Ratio: 8 — ABNORMAL LOW (ref 10–22)
BUN: 53 mg/dL — ABNORMAL HIGH (ref 8–27)
CO2: 28 mmol/L (ref 18–29)
Calcium: 9.7 mg/dL (ref 8.6–10.2)
Chloride: 97 mmol/L (ref 97–108)
Creatinine, Ser: 6.86 mg/dL — ABNORMAL HIGH (ref 0.76–1.27)
GFR calc Af Amer: 8 mL/min/{1.73_m2} — ABNORMAL LOW (ref >59)
GFR calc non Af Amer: 7 mL/min/{1.73_m2} — ABNORMAL LOW (ref >59)
Globulin, Total: 2.6 g/dL (ref 1.5–4.5)
Glucose: 134 mg/dL — ABNORMAL HIGH (ref 65–99)
Potassium: 5 mmol/L (ref 3.5–5.2)
Sodium: 142 mmol/L (ref 134–144)
Total Bilirubin: 0.4 mg/dL (ref 0.0–1.2)
Total Protein: 7.1 g/dL (ref 6.0–8.5)

## 2014-08-16 LAB — CBC WITH DIFFERENTIAL
Basophils Absolute: 0 10*3/uL (ref 0.0–0.2)
Basos: 1 %
Eos: 4 %
Eosinophils Absolute: 0.1 10*3/uL (ref 0.0–0.4)
HCT: 29.8 % — ABNORMAL LOW (ref 37.5–51.0)
Hemoglobin: 9.9 g/dL — ABNORMAL LOW (ref 12.6–17.7)
Immature Grans (Abs): 0 10*3/uL (ref 0.0–0.1)
Immature Granulocytes: 0 %
Lymphocytes Absolute: 1.1 10*3/uL (ref 0.7–3.1)
Lymphs: 33 %
MCH: 30.7 pg (ref 26.6–33.0)
MCHC: 33.2 g/dL (ref 31.5–35.7)
MCV: 93 fL (ref 79–97)
Monocytes Absolute: 0.2 10*3/uL (ref 0.1–0.9)
Monocytes: 5 %
Neutrophils Absolute: 1.9 10*3/uL (ref 1.4–7.0)
Neutrophils Relative %: 57 %
Platelets: 94 10*3/uL — CL (ref 150–379)
RBC: 3.22 x10E6/uL — ABNORMAL LOW (ref 4.14–5.80)
RDW: 15.6 % — ABNORMAL HIGH (ref 12.3–15.4)
WBC: 3.4 10*3/uL (ref 3.4–10.8)

## 2014-08-16 LAB — HEMOGLOBIN A1C
Est. average glucose Bld gHb Est-mCnc: 128 mg/dL
Hgb A1c MFr Bld: 6.1 % — ABNORMAL HIGH (ref 4.8–5.6)

## 2014-08-16 LAB — URIC ACID: Uric Acid: 4.1 mg/dL (ref 3.7–8.6)

## 2014-08-27 ENCOUNTER — Encounter: Payer: Self-pay | Admitting: *Deleted

## 2014-08-27 ENCOUNTER — Telehealth: Payer: Self-pay | Admitting: *Deleted

## 2014-08-27 ENCOUNTER — Ambulatory Visit: Payer: Medicare Other

## 2014-08-27 NOTE — Telephone Encounter (Signed)
Attempt to call son's cell phone to give father's lab results again, was unable to reach him. After talking with my office manager, we decided to send a certified letter to the son for a response.

## 2014-08-27 NOTE — Telephone Encounter (Signed)
-----   Message from Kermit Balo, DO sent at 08/16/2014 12:51 PM EST ----- Anemia is good right now with hgb 9.9. Platelets are low.   Sugar average is good at 6.1 Uric acid is at goal without regular medication

## 2014-08-27 NOTE — Telephone Encounter (Signed)
Spoke with son regarding lab results , he stated that he understood the results and does not have any questions at this time.

## 2014-09-07 DIAGNOSIS — D631 Anemia in chronic kidney disease: Secondary | ICD-10-CM | POA: Diagnosis not present

## 2014-09-07 DIAGNOSIS — M6281 Muscle weakness (generalized): Secondary | ICD-10-CM | POA: Diagnosis not present

## 2014-09-07 DIAGNOSIS — N186 End stage renal disease: Secondary | ICD-10-CM | POA: Diagnosis not present

## 2014-09-07 DIAGNOSIS — I10 Essential (primary) hypertension: Secondary | ICD-10-CM | POA: Diagnosis not present

## 2014-09-07 DIAGNOSIS — M4005 Postural kyphosis, thoracolumbar region: Secondary | ICD-10-CM | POA: Diagnosis not present

## 2014-09-07 DIAGNOSIS — R262 Difficulty in walking, not elsewhere classified: Secondary | ICD-10-CM | POA: Diagnosis not present

## 2014-09-07 DIAGNOSIS — D509 Iron deficiency anemia, unspecified: Secondary | ICD-10-CM | POA: Diagnosis not present

## 2014-09-07 DIAGNOSIS — E1129 Type 2 diabetes mellitus with other diabetic kidney complication: Secondary | ICD-10-CM | POA: Diagnosis not present

## 2014-09-09 DIAGNOSIS — E1129 Type 2 diabetes mellitus with other diabetic kidney complication: Secondary | ICD-10-CM | POA: Diagnosis not present

## 2014-09-09 DIAGNOSIS — N186 End stage renal disease: Secondary | ICD-10-CM | POA: Diagnosis not present

## 2014-09-09 DIAGNOSIS — D509 Iron deficiency anemia, unspecified: Secondary | ICD-10-CM | POA: Diagnosis not present

## 2014-09-09 DIAGNOSIS — D631 Anemia in chronic kidney disease: Secondary | ICD-10-CM | POA: Diagnosis not present

## 2014-09-10 DIAGNOSIS — N186 End stage renal disease: Secondary | ICD-10-CM | POA: Diagnosis not present

## 2014-09-10 DIAGNOSIS — R262 Difficulty in walking, not elsewhere classified: Secondary | ICD-10-CM | POA: Diagnosis not present

## 2014-09-10 DIAGNOSIS — M6281 Muscle weakness (generalized): Secondary | ICD-10-CM | POA: Diagnosis not present

## 2014-09-10 DIAGNOSIS — M4005 Postural kyphosis, thoracolumbar region: Secondary | ICD-10-CM | POA: Diagnosis not present

## 2014-09-10 DIAGNOSIS — I10 Essential (primary) hypertension: Secondary | ICD-10-CM | POA: Diagnosis not present

## 2014-09-11 DIAGNOSIS — D631 Anemia in chronic kidney disease: Secondary | ICD-10-CM | POA: Diagnosis not present

## 2014-09-11 DIAGNOSIS — E1129 Type 2 diabetes mellitus with other diabetic kidney complication: Secondary | ICD-10-CM | POA: Diagnosis not present

## 2014-09-11 DIAGNOSIS — N186 End stage renal disease: Secondary | ICD-10-CM | POA: Diagnosis not present

## 2014-09-11 DIAGNOSIS — D509 Iron deficiency anemia, unspecified: Secondary | ICD-10-CM | POA: Diagnosis not present

## 2014-09-12 DIAGNOSIS — M4005 Postural kyphosis, thoracolumbar region: Secondary | ICD-10-CM | POA: Diagnosis not present

## 2014-09-12 DIAGNOSIS — M6281 Muscle weakness (generalized): Secondary | ICD-10-CM | POA: Diagnosis not present

## 2014-09-12 DIAGNOSIS — R262 Difficulty in walking, not elsewhere classified: Secondary | ICD-10-CM | POA: Diagnosis not present

## 2014-09-12 DIAGNOSIS — N186 End stage renal disease: Secondary | ICD-10-CM | POA: Diagnosis not present

## 2014-09-12 DIAGNOSIS — I10 Essential (primary) hypertension: Secondary | ICD-10-CM | POA: Diagnosis not present

## 2014-09-13 DIAGNOSIS — N186 End stage renal disease: Secondary | ICD-10-CM | POA: Diagnosis not present

## 2014-09-13 DIAGNOSIS — E1129 Type 2 diabetes mellitus with other diabetic kidney complication: Secondary | ICD-10-CM | POA: Diagnosis not present

## 2014-09-13 DIAGNOSIS — D631 Anemia in chronic kidney disease: Secondary | ICD-10-CM | POA: Diagnosis not present

## 2014-09-13 DIAGNOSIS — D509 Iron deficiency anemia, unspecified: Secondary | ICD-10-CM | POA: Diagnosis not present

## 2014-09-14 DIAGNOSIS — I10 Essential (primary) hypertension: Secondary | ICD-10-CM | POA: Diagnosis not present

## 2014-09-14 DIAGNOSIS — M4005 Postural kyphosis, thoracolumbar region: Secondary | ICD-10-CM | POA: Diagnosis not present

## 2014-09-14 DIAGNOSIS — N186 End stage renal disease: Secondary | ICD-10-CM | POA: Diagnosis not present

## 2014-09-14 DIAGNOSIS — R262 Difficulty in walking, not elsewhere classified: Secondary | ICD-10-CM | POA: Diagnosis not present

## 2014-09-14 DIAGNOSIS — M6281 Muscle weakness (generalized): Secondary | ICD-10-CM | POA: Diagnosis not present

## 2014-09-16 DIAGNOSIS — D631 Anemia in chronic kidney disease: Secondary | ICD-10-CM | POA: Diagnosis not present

## 2014-09-16 DIAGNOSIS — E1129 Type 2 diabetes mellitus with other diabetic kidney complication: Secondary | ICD-10-CM | POA: Diagnosis not present

## 2014-09-16 DIAGNOSIS — D509 Iron deficiency anemia, unspecified: Secondary | ICD-10-CM | POA: Diagnosis not present

## 2014-09-16 DIAGNOSIS — N186 End stage renal disease: Secondary | ICD-10-CM | POA: Diagnosis not present

## 2014-09-17 DIAGNOSIS — M6281 Muscle weakness (generalized): Secondary | ICD-10-CM | POA: Diagnosis not present

## 2014-09-17 DIAGNOSIS — I10 Essential (primary) hypertension: Secondary | ICD-10-CM | POA: Diagnosis not present

## 2014-09-17 DIAGNOSIS — M4005 Postural kyphosis, thoracolumbar region: Secondary | ICD-10-CM | POA: Diagnosis not present

## 2014-09-17 DIAGNOSIS — R262 Difficulty in walking, not elsewhere classified: Secondary | ICD-10-CM | POA: Diagnosis not present

## 2014-09-17 DIAGNOSIS — N186 End stage renal disease: Secondary | ICD-10-CM | POA: Diagnosis not present

## 2014-09-18 DIAGNOSIS — E1129 Type 2 diabetes mellitus with other diabetic kidney complication: Secondary | ICD-10-CM | POA: Diagnosis not present

## 2014-09-18 DIAGNOSIS — D509 Iron deficiency anemia, unspecified: Secondary | ICD-10-CM | POA: Diagnosis not present

## 2014-09-18 DIAGNOSIS — D631 Anemia in chronic kidney disease: Secondary | ICD-10-CM | POA: Diagnosis not present

## 2014-09-18 DIAGNOSIS — N186 End stage renal disease: Secondary | ICD-10-CM | POA: Diagnosis not present

## 2014-09-19 DIAGNOSIS — I10 Essential (primary) hypertension: Secondary | ICD-10-CM | POA: Diagnosis not present

## 2014-09-19 DIAGNOSIS — R262 Difficulty in walking, not elsewhere classified: Secondary | ICD-10-CM | POA: Diagnosis not present

## 2014-09-19 DIAGNOSIS — M4005 Postural kyphosis, thoracolumbar region: Secondary | ICD-10-CM | POA: Diagnosis not present

## 2014-09-19 DIAGNOSIS — N186 End stage renal disease: Secondary | ICD-10-CM | POA: Diagnosis not present

## 2014-09-19 DIAGNOSIS — M6281 Muscle weakness (generalized): Secondary | ICD-10-CM | POA: Diagnosis not present

## 2014-09-20 DIAGNOSIS — N186 End stage renal disease: Secondary | ICD-10-CM | POA: Diagnosis not present

## 2014-09-20 DIAGNOSIS — D509 Iron deficiency anemia, unspecified: Secondary | ICD-10-CM | POA: Diagnosis not present

## 2014-09-20 DIAGNOSIS — E1129 Type 2 diabetes mellitus with other diabetic kidney complication: Secondary | ICD-10-CM | POA: Diagnosis not present

## 2014-09-20 DIAGNOSIS — D631 Anemia in chronic kidney disease: Secondary | ICD-10-CM | POA: Diagnosis not present

## 2014-09-21 DIAGNOSIS — M6281 Muscle weakness (generalized): Secondary | ICD-10-CM | POA: Diagnosis not present

## 2014-09-21 DIAGNOSIS — I10 Essential (primary) hypertension: Secondary | ICD-10-CM | POA: Diagnosis not present

## 2014-09-21 DIAGNOSIS — N186 End stage renal disease: Secondary | ICD-10-CM | POA: Diagnosis not present

## 2014-09-21 DIAGNOSIS — M4005 Postural kyphosis, thoracolumbar region: Secondary | ICD-10-CM | POA: Diagnosis not present

## 2014-09-21 DIAGNOSIS — R262 Difficulty in walking, not elsewhere classified: Secondary | ICD-10-CM | POA: Diagnosis not present

## 2014-09-23 DIAGNOSIS — D509 Iron deficiency anemia, unspecified: Secondary | ICD-10-CM | POA: Diagnosis not present

## 2014-09-23 DIAGNOSIS — E1129 Type 2 diabetes mellitus with other diabetic kidney complication: Secondary | ICD-10-CM | POA: Diagnosis not present

## 2014-09-23 DIAGNOSIS — N186 End stage renal disease: Secondary | ICD-10-CM | POA: Diagnosis not present

## 2014-09-23 DIAGNOSIS — D631 Anemia in chronic kidney disease: Secondary | ICD-10-CM | POA: Diagnosis not present

## 2014-09-24 ENCOUNTER — Ambulatory Visit (INDEPENDENT_AMBULATORY_CARE_PROVIDER_SITE_OTHER): Payer: Medicare Other

## 2014-09-24 DIAGNOSIS — M79676 Pain in unspecified toe(s): Secondary | ICD-10-CM

## 2014-09-24 DIAGNOSIS — B351 Tinea unguium: Secondary | ICD-10-CM

## 2014-09-24 DIAGNOSIS — M4005 Postural kyphosis, thoracolumbar region: Secondary | ICD-10-CM | POA: Diagnosis not present

## 2014-09-24 DIAGNOSIS — E1140 Type 2 diabetes mellitus with diabetic neuropathy, unspecified: Secondary | ICD-10-CM | POA: Diagnosis not present

## 2014-09-24 DIAGNOSIS — R262 Difficulty in walking, not elsewhere classified: Secondary | ICD-10-CM | POA: Diagnosis not present

## 2014-09-24 DIAGNOSIS — Q828 Other specified congenital malformations of skin: Secondary | ICD-10-CM

## 2014-09-24 DIAGNOSIS — M2040 Other hammer toe(s) (acquired), unspecified foot: Secondary | ICD-10-CM

## 2014-09-24 DIAGNOSIS — I10 Essential (primary) hypertension: Secondary | ICD-10-CM | POA: Diagnosis not present

## 2014-09-24 DIAGNOSIS — M6281 Muscle weakness (generalized): Secondary | ICD-10-CM | POA: Diagnosis not present

## 2014-09-24 DIAGNOSIS — N186 End stage renal disease: Secondary | ICD-10-CM | POA: Diagnosis not present

## 2014-09-24 NOTE — Progress Notes (Signed)
   Subjective:    Patient ID: [name], male    DOB: 01-17-1932, 81 y.o.   MRN: [id number]  HPI Pt presents for nail debridement   Review of Systems no new changes or findings or systemic changes noted     Objective:   Physical Exam neurovascular status unchanged patient is thick brittle crumbly dystrophic friable nails 1 through 5 bilateral. Does have some diffuse keratoses pedal pulses DP and PT one over 4 Refill time 3 seconds decreased sensation noted no open wounds no ulcers at this time. Otherwise stable the right hallux is hemorrhage a keratoses which is debrided back at this time      Assessment & Plan:  Assessment diabetes history of contracture of toes peripheral neuropathy painful mycotic dystrophic nails debrided 9 patient also at this time still keratotic lesion of the right hallux is debrided return for future palliative care every 3 months as recommended  Alvan Dame DPM

## 2014-09-25 DIAGNOSIS — E1129 Type 2 diabetes mellitus with other diabetic kidney complication: Secondary | ICD-10-CM | POA: Diagnosis not present

## 2014-09-25 DIAGNOSIS — D509 Iron deficiency anemia, unspecified: Secondary | ICD-10-CM | POA: Diagnosis not present

## 2014-09-25 DIAGNOSIS — D631 Anemia in chronic kidney disease: Secondary | ICD-10-CM | POA: Diagnosis not present

## 2014-09-25 DIAGNOSIS — N186 End stage renal disease: Secondary | ICD-10-CM | POA: Diagnosis not present

## 2014-09-26 DIAGNOSIS — I10 Essential (primary) hypertension: Secondary | ICD-10-CM | POA: Diagnosis not present

## 2014-09-26 DIAGNOSIS — R262 Difficulty in walking, not elsewhere classified: Secondary | ICD-10-CM | POA: Diagnosis not present

## 2014-09-26 DIAGNOSIS — M4005 Postural kyphosis, thoracolumbar region: Secondary | ICD-10-CM | POA: Diagnosis not present

## 2014-09-26 DIAGNOSIS — N186 End stage renal disease: Secondary | ICD-10-CM | POA: Diagnosis not present

## 2014-09-26 DIAGNOSIS — M6281 Muscle weakness (generalized): Secondary | ICD-10-CM | POA: Diagnosis not present

## 2014-09-28 DIAGNOSIS — I10 Essential (primary) hypertension: Secondary | ICD-10-CM | POA: Diagnosis not present

## 2014-09-28 DIAGNOSIS — M4005 Postural kyphosis, thoracolumbar region: Secondary | ICD-10-CM | POA: Diagnosis not present

## 2014-09-28 DIAGNOSIS — N186 End stage renal disease: Secondary | ICD-10-CM | POA: Diagnosis not present

## 2014-09-28 DIAGNOSIS — M6281 Muscle weakness (generalized): Secondary | ICD-10-CM | POA: Diagnosis not present

## 2014-09-28 DIAGNOSIS — R262 Difficulty in walking, not elsewhere classified: Secondary | ICD-10-CM | POA: Diagnosis not present

## 2014-09-30 DIAGNOSIS — N186 End stage renal disease: Secondary | ICD-10-CM | POA: Diagnosis not present

## 2014-09-30 DIAGNOSIS — D509 Iron deficiency anemia, unspecified: Secondary | ICD-10-CM | POA: Diagnosis not present

## 2014-09-30 DIAGNOSIS — E1129 Type 2 diabetes mellitus with other diabetic kidney complication: Secondary | ICD-10-CM | POA: Diagnosis not present

## 2014-09-30 DIAGNOSIS — D631 Anemia in chronic kidney disease: Secondary | ICD-10-CM | POA: Diagnosis not present

## 2014-10-01 DIAGNOSIS — N186 End stage renal disease: Secondary | ICD-10-CM | POA: Diagnosis not present

## 2014-10-01 DIAGNOSIS — M4005 Postural kyphosis, thoracolumbar region: Secondary | ICD-10-CM | POA: Diagnosis not present

## 2014-10-01 DIAGNOSIS — R262 Difficulty in walking, not elsewhere classified: Secondary | ICD-10-CM | POA: Diagnosis not present

## 2014-10-01 DIAGNOSIS — I10 Essential (primary) hypertension: Secondary | ICD-10-CM | POA: Diagnosis not present

## 2014-10-01 DIAGNOSIS — M6281 Muscle weakness (generalized): Secondary | ICD-10-CM | POA: Diagnosis not present

## 2014-10-02 DIAGNOSIS — D631 Anemia in chronic kidney disease: Secondary | ICD-10-CM | POA: Diagnosis not present

## 2014-10-02 DIAGNOSIS — E1129 Type 2 diabetes mellitus with other diabetic kidney complication: Secondary | ICD-10-CM | POA: Diagnosis not present

## 2014-10-02 DIAGNOSIS — N186 End stage renal disease: Secondary | ICD-10-CM | POA: Diagnosis not present

## 2014-10-02 DIAGNOSIS — D509 Iron deficiency anemia, unspecified: Secondary | ICD-10-CM | POA: Diagnosis not present

## 2014-10-03 DIAGNOSIS — R262 Difficulty in walking, not elsewhere classified: Secondary | ICD-10-CM | POA: Diagnosis not present

## 2014-10-03 DIAGNOSIS — M6281 Muscle weakness (generalized): Secondary | ICD-10-CM | POA: Diagnosis not present

## 2014-10-03 DIAGNOSIS — M4005 Postural kyphosis, thoracolumbar region: Secondary | ICD-10-CM | POA: Diagnosis not present

## 2014-10-03 DIAGNOSIS — N186 End stage renal disease: Secondary | ICD-10-CM | POA: Diagnosis not present

## 2014-10-03 DIAGNOSIS — I10 Essential (primary) hypertension: Secondary | ICD-10-CM | POA: Diagnosis not present

## 2014-10-04 DIAGNOSIS — N186 End stage renal disease: Secondary | ICD-10-CM | POA: Diagnosis not present

## 2014-10-04 DIAGNOSIS — D509 Iron deficiency anemia, unspecified: Secondary | ICD-10-CM | POA: Diagnosis not present

## 2014-10-04 DIAGNOSIS — D631 Anemia in chronic kidney disease: Secondary | ICD-10-CM | POA: Diagnosis not present

## 2014-10-04 DIAGNOSIS — E1129 Type 2 diabetes mellitus with other diabetic kidney complication: Secondary | ICD-10-CM | POA: Diagnosis not present

## 2014-10-05 DIAGNOSIS — M6281 Muscle weakness (generalized): Secondary | ICD-10-CM | POA: Diagnosis not present

## 2014-10-05 DIAGNOSIS — I10 Essential (primary) hypertension: Secondary | ICD-10-CM | POA: Diagnosis not present

## 2014-10-05 DIAGNOSIS — M4005 Postural kyphosis, thoracolumbar region: Secondary | ICD-10-CM | POA: Diagnosis not present

## 2014-10-05 DIAGNOSIS — R262 Difficulty in walking, not elsewhere classified: Secondary | ICD-10-CM | POA: Diagnosis not present

## 2014-10-05 DIAGNOSIS — N186 End stage renal disease: Secondary | ICD-10-CM | POA: Diagnosis not present

## 2014-10-06 DIAGNOSIS — N186 End stage renal disease: Secondary | ICD-10-CM | POA: Diagnosis not present

## 2014-10-06 DIAGNOSIS — Z992 Dependence on renal dialysis: Secondary | ICD-10-CM | POA: Diagnosis not present

## 2014-10-07 DIAGNOSIS — E1129 Type 2 diabetes mellitus with other diabetic kidney complication: Secondary | ICD-10-CM | POA: Diagnosis not present

## 2014-10-07 DIAGNOSIS — D509 Iron deficiency anemia, unspecified: Secondary | ICD-10-CM | POA: Diagnosis not present

## 2014-10-07 DIAGNOSIS — D631 Anemia in chronic kidney disease: Secondary | ICD-10-CM | POA: Diagnosis not present

## 2014-10-07 DIAGNOSIS — N186 End stage renal disease: Secondary | ICD-10-CM | POA: Diagnosis not present

## 2014-10-08 DIAGNOSIS — N186 End stage renal disease: Secondary | ICD-10-CM | POA: Diagnosis not present

## 2014-10-08 DIAGNOSIS — I10 Essential (primary) hypertension: Secondary | ICD-10-CM | POA: Diagnosis not present

## 2014-10-08 DIAGNOSIS — M6281 Muscle weakness (generalized): Secondary | ICD-10-CM | POA: Diagnosis not present

## 2014-10-08 DIAGNOSIS — M4005 Postural kyphosis, thoracolumbar region: Secondary | ICD-10-CM | POA: Diagnosis not present

## 2014-10-08 DIAGNOSIS — R262 Difficulty in walking, not elsewhere classified: Secondary | ICD-10-CM | POA: Diagnosis not present

## 2014-10-09 DIAGNOSIS — N186 End stage renal disease: Secondary | ICD-10-CM | POA: Diagnosis not present

## 2014-10-09 DIAGNOSIS — E1129 Type 2 diabetes mellitus with other diabetic kidney complication: Secondary | ICD-10-CM | POA: Diagnosis not present

## 2014-10-09 DIAGNOSIS — D631 Anemia in chronic kidney disease: Secondary | ICD-10-CM | POA: Diagnosis not present

## 2014-10-09 DIAGNOSIS — D509 Iron deficiency anemia, unspecified: Secondary | ICD-10-CM | POA: Diagnosis not present

## 2014-10-10 DIAGNOSIS — R262 Difficulty in walking, not elsewhere classified: Secondary | ICD-10-CM | POA: Diagnosis not present

## 2014-10-10 DIAGNOSIS — M6281 Muscle weakness (generalized): Secondary | ICD-10-CM | POA: Diagnosis not present

## 2014-10-10 DIAGNOSIS — N186 End stage renal disease: Secondary | ICD-10-CM | POA: Diagnosis not present

## 2014-10-10 DIAGNOSIS — I10 Essential (primary) hypertension: Secondary | ICD-10-CM | POA: Diagnosis not present

## 2014-10-10 DIAGNOSIS — M4005 Postural kyphosis, thoracolumbar region: Secondary | ICD-10-CM | POA: Diagnosis not present

## 2014-10-11 DIAGNOSIS — D631 Anemia in chronic kidney disease: Secondary | ICD-10-CM | POA: Diagnosis not present

## 2014-10-11 DIAGNOSIS — D509 Iron deficiency anemia, unspecified: Secondary | ICD-10-CM | POA: Diagnosis not present

## 2014-10-11 DIAGNOSIS — N186 End stage renal disease: Secondary | ICD-10-CM | POA: Diagnosis not present

## 2014-10-11 DIAGNOSIS — E1129 Type 2 diabetes mellitus with other diabetic kidney complication: Secondary | ICD-10-CM | POA: Diagnosis not present

## 2014-10-12 DIAGNOSIS — M6281 Muscle weakness (generalized): Secondary | ICD-10-CM | POA: Diagnosis not present

## 2014-10-12 DIAGNOSIS — M4005 Postural kyphosis, thoracolumbar region: Secondary | ICD-10-CM | POA: Diagnosis not present

## 2014-10-12 DIAGNOSIS — R262 Difficulty in walking, not elsewhere classified: Secondary | ICD-10-CM | POA: Diagnosis not present

## 2014-10-12 DIAGNOSIS — I10 Essential (primary) hypertension: Secondary | ICD-10-CM | POA: Diagnosis not present

## 2014-10-12 DIAGNOSIS — N186 End stage renal disease: Secondary | ICD-10-CM | POA: Diagnosis not present

## 2014-10-14 DIAGNOSIS — N186 End stage renal disease: Secondary | ICD-10-CM | POA: Diagnosis not present

## 2014-10-14 DIAGNOSIS — E1129 Type 2 diabetes mellitus with other diabetic kidney complication: Secondary | ICD-10-CM | POA: Diagnosis not present

## 2014-10-14 DIAGNOSIS — D631 Anemia in chronic kidney disease: Secondary | ICD-10-CM | POA: Diagnosis not present

## 2014-10-14 DIAGNOSIS — D509 Iron deficiency anemia, unspecified: Secondary | ICD-10-CM | POA: Diagnosis not present

## 2014-10-15 DIAGNOSIS — N186 End stage renal disease: Secondary | ICD-10-CM | POA: Diagnosis not present

## 2014-10-15 DIAGNOSIS — M6281 Muscle weakness (generalized): Secondary | ICD-10-CM | POA: Diagnosis not present

## 2014-10-15 DIAGNOSIS — R262 Difficulty in walking, not elsewhere classified: Secondary | ICD-10-CM | POA: Diagnosis not present

## 2014-10-15 DIAGNOSIS — I10 Essential (primary) hypertension: Secondary | ICD-10-CM | POA: Diagnosis not present

## 2014-10-15 DIAGNOSIS — M4005 Postural kyphosis, thoracolumbar region: Secondary | ICD-10-CM | POA: Diagnosis not present

## 2014-10-16 DIAGNOSIS — D631 Anemia in chronic kidney disease: Secondary | ICD-10-CM | POA: Diagnosis not present

## 2014-10-16 DIAGNOSIS — N186 End stage renal disease: Secondary | ICD-10-CM | POA: Diagnosis not present

## 2014-10-16 DIAGNOSIS — E1129 Type 2 diabetes mellitus with other diabetic kidney complication: Secondary | ICD-10-CM | POA: Diagnosis not present

## 2014-10-16 DIAGNOSIS — D509 Iron deficiency anemia, unspecified: Secondary | ICD-10-CM | POA: Diagnosis not present

## 2014-10-17 DIAGNOSIS — N186 End stage renal disease: Secondary | ICD-10-CM | POA: Diagnosis not present

## 2014-10-17 DIAGNOSIS — I10 Essential (primary) hypertension: Secondary | ICD-10-CM | POA: Diagnosis not present

## 2014-10-17 DIAGNOSIS — M6281 Muscle weakness (generalized): Secondary | ICD-10-CM | POA: Diagnosis not present

## 2014-10-17 DIAGNOSIS — M4005 Postural kyphosis, thoracolumbar region: Secondary | ICD-10-CM | POA: Diagnosis not present

## 2014-10-17 DIAGNOSIS — R262 Difficulty in walking, not elsewhere classified: Secondary | ICD-10-CM | POA: Diagnosis not present

## 2014-10-18 DIAGNOSIS — D509 Iron deficiency anemia, unspecified: Secondary | ICD-10-CM | POA: Diagnosis not present

## 2014-10-18 DIAGNOSIS — E1129 Type 2 diabetes mellitus with other diabetic kidney complication: Secondary | ICD-10-CM | POA: Diagnosis not present

## 2014-10-18 DIAGNOSIS — D631 Anemia in chronic kidney disease: Secondary | ICD-10-CM | POA: Diagnosis not present

## 2014-10-18 DIAGNOSIS — N186 End stage renal disease: Secondary | ICD-10-CM | POA: Diagnosis not present

## 2014-10-19 DIAGNOSIS — M4005 Postural kyphosis, thoracolumbar region: Secondary | ICD-10-CM | POA: Diagnosis not present

## 2014-10-19 DIAGNOSIS — N186 End stage renal disease: Secondary | ICD-10-CM | POA: Diagnosis not present

## 2014-10-19 DIAGNOSIS — I10 Essential (primary) hypertension: Secondary | ICD-10-CM | POA: Diagnosis not present

## 2014-10-19 DIAGNOSIS — M6281 Muscle weakness (generalized): Secondary | ICD-10-CM | POA: Diagnosis not present

## 2014-10-19 DIAGNOSIS — R262 Difficulty in walking, not elsewhere classified: Secondary | ICD-10-CM | POA: Diagnosis not present

## 2014-10-22 DIAGNOSIS — M6281 Muscle weakness (generalized): Secondary | ICD-10-CM | POA: Diagnosis not present

## 2014-10-22 DIAGNOSIS — I10 Essential (primary) hypertension: Secondary | ICD-10-CM | POA: Diagnosis not present

## 2014-10-22 DIAGNOSIS — M4005 Postural kyphosis, thoracolumbar region: Secondary | ICD-10-CM | POA: Diagnosis not present

## 2014-10-22 DIAGNOSIS — N186 End stage renal disease: Secondary | ICD-10-CM | POA: Diagnosis not present

## 2014-10-22 DIAGNOSIS — R262 Difficulty in walking, not elsewhere classified: Secondary | ICD-10-CM | POA: Diagnosis not present

## 2014-10-23 DIAGNOSIS — E1129 Type 2 diabetes mellitus with other diabetic kidney complication: Secondary | ICD-10-CM | POA: Diagnosis not present

## 2014-10-23 DIAGNOSIS — D509 Iron deficiency anemia, unspecified: Secondary | ICD-10-CM | POA: Diagnosis not present

## 2014-10-23 DIAGNOSIS — N186 End stage renal disease: Secondary | ICD-10-CM | POA: Diagnosis not present

## 2014-10-23 DIAGNOSIS — D631 Anemia in chronic kidney disease: Secondary | ICD-10-CM | POA: Diagnosis not present

## 2014-10-24 DIAGNOSIS — M4005 Postural kyphosis, thoracolumbar region: Secondary | ICD-10-CM | POA: Diagnosis not present

## 2014-10-24 DIAGNOSIS — R262 Difficulty in walking, not elsewhere classified: Secondary | ICD-10-CM | POA: Diagnosis not present

## 2014-10-24 DIAGNOSIS — N186 End stage renal disease: Secondary | ICD-10-CM | POA: Diagnosis not present

## 2014-10-24 DIAGNOSIS — I10 Essential (primary) hypertension: Secondary | ICD-10-CM | POA: Diagnosis not present

## 2014-10-24 DIAGNOSIS — M6281 Muscle weakness (generalized): Secondary | ICD-10-CM | POA: Diagnosis not present

## 2014-10-25 ENCOUNTER — Encounter: Payer: Medicare Other | Admitting: Internal Medicine

## 2014-10-25 DIAGNOSIS — E1129 Type 2 diabetes mellitus with other diabetic kidney complication: Secondary | ICD-10-CM | POA: Diagnosis not present

## 2014-10-25 DIAGNOSIS — D509 Iron deficiency anemia, unspecified: Secondary | ICD-10-CM | POA: Diagnosis not present

## 2014-10-25 DIAGNOSIS — D631 Anemia in chronic kidney disease: Secondary | ICD-10-CM | POA: Diagnosis not present

## 2014-10-25 DIAGNOSIS — N186 End stage renal disease: Secondary | ICD-10-CM | POA: Diagnosis not present

## 2014-10-26 DIAGNOSIS — M6281 Muscle weakness (generalized): Secondary | ICD-10-CM | POA: Diagnosis not present

## 2014-10-26 DIAGNOSIS — I10 Essential (primary) hypertension: Secondary | ICD-10-CM | POA: Diagnosis not present

## 2014-10-26 DIAGNOSIS — M4005 Postural kyphosis, thoracolumbar region: Secondary | ICD-10-CM | POA: Diagnosis not present

## 2014-10-26 DIAGNOSIS — R262 Difficulty in walking, not elsewhere classified: Secondary | ICD-10-CM | POA: Diagnosis not present

## 2014-10-26 DIAGNOSIS — N186 End stage renal disease: Secondary | ICD-10-CM | POA: Diagnosis not present

## 2014-10-28 DIAGNOSIS — D509 Iron deficiency anemia, unspecified: Secondary | ICD-10-CM | POA: Diagnosis not present

## 2014-10-28 DIAGNOSIS — D631 Anemia in chronic kidney disease: Secondary | ICD-10-CM | POA: Diagnosis not present

## 2014-10-28 DIAGNOSIS — E1129 Type 2 diabetes mellitus with other diabetic kidney complication: Secondary | ICD-10-CM | POA: Diagnosis not present

## 2014-10-28 DIAGNOSIS — N186 End stage renal disease: Secondary | ICD-10-CM | POA: Diagnosis not present

## 2014-10-29 ENCOUNTER — Encounter: Payer: Self-pay | Admitting: Diagnostic Neuroimaging

## 2014-10-29 ENCOUNTER — Ambulatory Visit (INDEPENDENT_AMBULATORY_CARE_PROVIDER_SITE_OTHER): Payer: Medicare Other | Admitting: Diagnostic Neuroimaging

## 2014-10-29 VITALS — BP 162/77 | HR 73 | Ht 70.0 in | Wt 208.6 lb

## 2014-10-29 DIAGNOSIS — I10 Essential (primary) hypertension: Secondary | ICD-10-CM | POA: Diagnosis not present

## 2014-10-29 DIAGNOSIS — N186 End stage renal disease: Secondary | ICD-10-CM | POA: Diagnosis not present

## 2014-10-29 DIAGNOSIS — R262 Difficulty in walking, not elsewhere classified: Secondary | ICD-10-CM | POA: Diagnosis not present

## 2014-10-29 DIAGNOSIS — M4005 Postural kyphosis, thoracolumbar region: Secondary | ICD-10-CM | POA: Diagnosis not present

## 2014-10-29 DIAGNOSIS — M6281 Muscle weakness (generalized): Secondary | ICD-10-CM | POA: Diagnosis not present

## 2014-10-29 DIAGNOSIS — G40909 Epilepsy, unspecified, not intractable, without status epilepticus: Secondary | ICD-10-CM | POA: Diagnosis not present

## 2014-10-29 MED ORDER — LACOSAMIDE 50 MG PO TABS: 50.0000 mg | ORAL_TABLET | Freq: Two times a day (BID) | ORAL | Status: DC

## 2014-10-29 NOTE — Patient Instructions (Signed)
Continue vimpat 50mg  twice a day.

## 2014-10-29 NOTE — Progress Notes (Signed)
PATIENT: [name] DOB: [birth year]  REASON FOR VISIT: routine follow up for seizure disorder, memory HISTORY FROM: patient and wife   Chief Complaint  Patient presents with  . Follow-up    seizure     HISTORY OF PRESENT ILLNESS:  UPDATE 10/29/14 (VRP): Patient is doing well. No seizures. Memory loss has improved. He continues on hemodialysis and is doing well.  UPDATE 04/22/14 (LL):  Since last visit, patient is much improved. No seizures, left rehab and living again at home with his wife.  Mood much better. Cognition better. Reportedly taking less fluid off at hemodialysis. Tolerating vimpat without side effects.  UPDATE 10/17/13: Since last visit, was admitted 09/25/13 - 09/29/13 for altered mental status,elevated VPA level (107.5). VPA was d/c's and vimpat 100mg  BID was started. Also was admitted 10/01/13 - 10/09/13 for metabolic encephalopathy. This time vimpat was decreased to 50mg  BID. No definite witnessed seizures. Now in rehab, trying to improve strength before returning home.   PRIOR HPI (02/09/13): 81 y.o. male with ESRD, on dialysis x 3 weeks, diabetes, hypertension, recent history of GI bleeding from arteriovenous malformations. On 01/12/13 was in the bathroom at home he sustained a syncopal event. His wife was downstairs, she heard him fall and ran upstairs, she found him on the bathroom floor wedged between the wall and the commode. He was apparently awake but lethargic and somewhat confused. She then called EMS, upon EMS arrival patient started slowly coming around, he was then brought to the emergency room for further evaluation. While in the emergency room patient completely regained his usual mentation. CT of the head and C-spine was found to be negative, patient sustained a small laceration above his left eye. Son reports that patient had second witnessed seizure in the hospital, where he had convulsions and LOC. He was incontinent of urine without tongue-biting. The episode  lasted a few minutes. Per the history obtained, patient has been in his usual state of health over the past few days to weeks. He does have some unsteady gait, but has not been excessively lethargic nor has he had any issues with nausea vomiting.Patient does not have a history of seizures in the past. He had not been on hemodialysis prior to hospitalization although he has had several grafts placed in anticipation of starting HD. Patient now gets HD Tues, Thurs, Sat. Physical therapy on Monday, Wed. Friday.    REVIEW OF SYSTEMS: Full 14 system review of systems performed and notable only for constipation diarrhea restless legs apnea snoring moles neck stiffness muscle cramps walking difficulty back pain joint pain anemia.  ALLERGIES: Allergies  Allergen Reactions  . Aspirin Other (See Comments)    Bleeding   . Lactose Intolerance (Gi) Diarrhea    HOME MEDICATIONS: Outpatient Prescriptions Prior to Visit  Medication Sig Dispense Refill  . ACCU-CHEK AVIVA PLUS test strip     . amLODipine (NORVASC) 10 MG tablet Take 10 mg by mouth Daily.     . calcium acetate (PHOSLO) 667 MG capsule Take 667 mg by mouth 3 (three) times daily.  0  . cyclobenzaprine (FLEXERIL) 5 MG tablet Take 5 mg by mouth daily as needed.  0  . darbepoetin (ARANESP) 60 MCG/0.3ML SOLN injection Inject 0.3 mLs (60 mcg total) into the vein every Thursday with hemodialysis. 4.2 mL   . doxercalciferol (HECTOROL) 4 MCG/2ML injection Inject 2 mLs (4 mcg total) into the vein Every Tuesday,Thursday,and Saturday with dialysis. 2 mL   . folic acid (FOLVITE) 1 MG  tablet Take 1 tablet (1 mg total) by mouth daily. 30 tablet 0  . gabapentin (NEURONTIN) 100 MG capsule Take 1 capsule (100 mg total) by mouth 3 (three) times daily. 90 capsule 3  . insulin aspart (NOVOLOG) 100 UNIT/ML injection 0-9 Units, Subcutaneous, 3 times daily with meals CBG < 70: implement hypoglycemia protocol CBG 70 - 120: 0 units CBG 121 - 150: 1 unit CBG 151 - 200:  2 units CBG 201 - 250: 3 units CBG 251 - 300: 5 units CBG 301 - 350: 7 units CBG 351 - 400: 9 units CBG > 400: call MD 10 mL 11  . multivitamin (RENA-VIT) TABS tablet Take 1 tablet by mouth at bedtime. 30 tablet 0  . sodium chloride 0.9 % SOLN 100 mL with ferric gluconate 12.5 MG/ML SOLN 62.5 mg Inject 62.5 mg into the vein every Thursday with hemodialysis.    Marland Kitchen lacosamide (VIMPAT) 50 MG TABS tablet Take 1 tablet (50 mg total) by mouth 2 (two) times daily. 60 tablet 5  . colchicine 0.6 MG tablet      No facility-administered medications prior to visit.    PHYSICAL EXAM Filed Vitals:   10/29/14 1118  BP: 162/77  Pulse: 73  Height: 5\' 10"  (1.778 m)  Weight: 208 lb 9.6 oz (94.62 kg)   Body mass index is 29.93 kg/(m^2).  MMSE - Mini Mental State Exam 10/29/2014 04/22/2014 10/17/2013  Orientation to time 5 4 2   Orientation to Place 5 5 3   Registration 3 3 3   Attention/ Calculation 4 3 1   Recall 3 2 1   Language- name 2 objects 2 2 2   Language- repeat 1 1 1   Language- follow 3 step command 2 3 3   Language- read & follow direction 1 1 1   Write a sentence 1 1 1   Copy design 0 1 1  Total score 27 26 19    Generalized: Well developed, pleasant elderly male in no acute distress  Neck: Supple, no carotid bruits  Cardiac: Regular rate rhythm, BLOWING HOLOSYSTOLIC MURMUR, OVER RIGHT PRECORDIAL REGION; RIGHT ANTEBRACHIAL AV FISTULA WITH PALPABLE THRILL  NEUROLOGIC:  MENTAL STATUS: awake, alert, language fluent, comprehension intact, naming intact; MMSE 27/30 CRANIAL NERVE: pupils equal and reactive to light, visual fields full to confrontation, extraocular muscles intact, no nystagmus, facial sensation and strength symmetric, uvula midline, shoulder shrug symmetric, tongue midline.  MOTOR: normal bulk and tone, full strength in the BUE, BLE  SENSORY: ABSENT VIBRATION IN LOWER EXT  COORDINATION: finger-nose-finger, fine finger movements normal  REFLEXES: deep tendon reflexes present and  symmetric, ABSENT BILAT KNEE AND ANKLE  GAIT/STATION: narrow based gait; UNSTEADY ON RISING, romberg is negative, GAIT ASSISTED BY CANE.   DIAGNOSTICS/IMAGING:  01/17/13 MRI head: No acute infarct. Tiny area of blood breakdown products posterior left temporal lobe, left frontal lobe and right occipital lobe may represent result of prior hemorrhagic ischemia although result of prior trauma not entirely excluded. Broad-based (5 mm thickness) left hemispheric subdural collection with cerebrospinal fluid intensity suggestive of cystic hygroma (versus chronic subdural hematoma). Prominent small vessel disease type changes. Global atrophy without hydrocephalus. Paranasal sinus mucosal thickening. Cervical spondylotic changes with spinal stenosis and slight cord flattening C3-4 and left so at the C2-3 level.   01/17/13 EEG: This is an abnormal EEG secondary to general background slowing. Although this can be seen with drowse, cannot rule out the possibility of diffuse cerebral disturbance that is etiologically nonspecific but may include a metabolic encephalopathy among other possibilities. Also noted was  rare sharp activity over the left hemisphere.   10/06/13 MRI brain: moderate perisylvian and mesial temporal atrophy; moderate-severe chronic small vessel ischemic disease; numerous chronic cerebral microhemorrhages on SWAN views     ASSESSMENT/PLAN:  81 y.o. right-handed male with Hypertension; Diabetes mellitus; GI bleed; ESRD (end stage renal disease); here for follow up of seizure disorder and memory loss.   Patient had seizure x 2 in May 2014, in the setting of pericortical microhemorrhage in the left temporal lobe and left sided sharp waves on EEG. Now stable on vimpat  BID.  Also had memory problems and metabolic encephalopathy, now much improved.   PLAN:  - continue vimpat  BID  Meds ordered this encounter  Medications  . lacosamide (VIMPAT) 50 MG TABS tablet    Sig: Take 1 tablet  (50 mg total) by mouth 2 (two) times daily.    Dispense:  180 tablet    Refill:  4    Pharmacy Fax [phone]   Return in about 6 months (around 04/29/2015) for with NP/PA or Braeton Wolgamott.   Suanne Marker, MD 10/29/2014, 12:17 PM Certified in Neurology, Neurophysiology and Neuroimaging  [facility] [address] [place], [place] [address] [phone]

## 2014-10-30 DIAGNOSIS — D631 Anemia in chronic kidney disease: Secondary | ICD-10-CM | POA: Diagnosis not present

## 2014-10-30 DIAGNOSIS — N186 End stage renal disease: Secondary | ICD-10-CM | POA: Diagnosis not present

## 2014-10-30 DIAGNOSIS — E1129 Type 2 diabetes mellitus with other diabetic kidney complication: Secondary | ICD-10-CM | POA: Diagnosis not present

## 2014-10-30 DIAGNOSIS — D509 Iron deficiency anemia, unspecified: Secondary | ICD-10-CM | POA: Diagnosis not present

## 2014-10-31 DIAGNOSIS — M6281 Muscle weakness (generalized): Secondary | ICD-10-CM | POA: Diagnosis not present

## 2014-10-31 DIAGNOSIS — I10 Essential (primary) hypertension: Secondary | ICD-10-CM | POA: Diagnosis not present

## 2014-10-31 DIAGNOSIS — N186 End stage renal disease: Secondary | ICD-10-CM | POA: Diagnosis not present

## 2014-10-31 DIAGNOSIS — R262 Difficulty in walking, not elsewhere classified: Secondary | ICD-10-CM | POA: Diagnosis not present

## 2014-10-31 DIAGNOSIS — M4005 Postural kyphosis, thoracolumbar region: Secondary | ICD-10-CM | POA: Diagnosis not present

## 2014-11-01 DIAGNOSIS — D631 Anemia in chronic kidney disease: Secondary | ICD-10-CM | POA: Diagnosis not present

## 2014-11-01 DIAGNOSIS — E1129 Type 2 diabetes mellitus with other diabetic kidney complication: Secondary | ICD-10-CM | POA: Diagnosis not present

## 2014-11-01 DIAGNOSIS — D509 Iron deficiency anemia, unspecified: Secondary | ICD-10-CM | POA: Diagnosis not present

## 2014-11-01 DIAGNOSIS — N186 End stage renal disease: Secondary | ICD-10-CM | POA: Diagnosis not present

## 2014-11-02 DIAGNOSIS — I10 Essential (primary) hypertension: Secondary | ICD-10-CM | POA: Diagnosis not present

## 2014-11-02 DIAGNOSIS — M6281 Muscle weakness (generalized): Secondary | ICD-10-CM | POA: Diagnosis not present

## 2014-11-02 DIAGNOSIS — R262 Difficulty in walking, not elsewhere classified: Secondary | ICD-10-CM | POA: Diagnosis not present

## 2014-11-02 DIAGNOSIS — M4005 Postural kyphosis, thoracolumbar region: Secondary | ICD-10-CM | POA: Diagnosis not present

## 2014-11-02 DIAGNOSIS — N186 End stage renal disease: Secondary | ICD-10-CM | POA: Diagnosis not present

## 2014-11-04 DIAGNOSIS — D509 Iron deficiency anemia, unspecified: Secondary | ICD-10-CM | POA: Diagnosis not present

## 2014-11-04 DIAGNOSIS — D631 Anemia in chronic kidney disease: Secondary | ICD-10-CM | POA: Diagnosis not present

## 2014-11-04 DIAGNOSIS — Z992 Dependence on renal dialysis: Secondary | ICD-10-CM | POA: Diagnosis not present

## 2014-11-04 DIAGNOSIS — E1129 Type 2 diabetes mellitus with other diabetic kidney complication: Secondary | ICD-10-CM | POA: Diagnosis not present

## 2014-11-04 DIAGNOSIS — N186 End stage renal disease: Secondary | ICD-10-CM | POA: Diagnosis not present

## 2014-11-05 DIAGNOSIS — M6281 Muscle weakness (generalized): Secondary | ICD-10-CM | POA: Diagnosis not present

## 2014-11-05 DIAGNOSIS — I10 Essential (primary) hypertension: Secondary | ICD-10-CM | POA: Diagnosis not present

## 2014-11-05 DIAGNOSIS — N186 End stage renal disease: Secondary | ICD-10-CM | POA: Diagnosis not present

## 2014-11-05 DIAGNOSIS — R262 Difficulty in walking, not elsewhere classified: Secondary | ICD-10-CM | POA: Diagnosis not present

## 2014-11-05 DIAGNOSIS — M4005 Postural kyphosis, thoracolumbar region: Secondary | ICD-10-CM | POA: Diagnosis not present

## 2014-11-06 DIAGNOSIS — N186 End stage renal disease: Secondary | ICD-10-CM | POA: Diagnosis not present

## 2014-11-06 DIAGNOSIS — D509 Iron deficiency anemia, unspecified: Secondary | ICD-10-CM | POA: Diagnosis not present

## 2014-11-06 DIAGNOSIS — E1129 Type 2 diabetes mellitus with other diabetic kidney complication: Secondary | ICD-10-CM | POA: Diagnosis not present

## 2014-11-06 DIAGNOSIS — D631 Anemia in chronic kidney disease: Secondary | ICD-10-CM | POA: Diagnosis not present

## 2014-11-07 DIAGNOSIS — M6281 Muscle weakness (generalized): Secondary | ICD-10-CM | POA: Diagnosis not present

## 2014-11-07 DIAGNOSIS — N186 End stage renal disease: Secondary | ICD-10-CM | POA: Diagnosis not present

## 2014-11-07 DIAGNOSIS — R262 Difficulty in walking, not elsewhere classified: Secondary | ICD-10-CM | POA: Diagnosis not present

## 2014-11-07 DIAGNOSIS — M4005 Postural kyphosis, thoracolumbar region: Secondary | ICD-10-CM | POA: Diagnosis not present

## 2014-11-07 DIAGNOSIS — I10 Essential (primary) hypertension: Secondary | ICD-10-CM | POA: Diagnosis not present

## 2014-11-08 DIAGNOSIS — G4733 Obstructive sleep apnea (adult) (pediatric): Secondary | ICD-10-CM | POA: Diagnosis not present

## 2014-11-08 DIAGNOSIS — E1129 Type 2 diabetes mellitus with other diabetic kidney complication: Secondary | ICD-10-CM | POA: Diagnosis not present

## 2014-11-08 DIAGNOSIS — N186 End stage renal disease: Secondary | ICD-10-CM | POA: Diagnosis not present

## 2014-11-08 DIAGNOSIS — D631 Anemia in chronic kidney disease: Secondary | ICD-10-CM | POA: Diagnosis not present

## 2014-11-08 DIAGNOSIS — D509 Iron deficiency anemia, unspecified: Secondary | ICD-10-CM | POA: Diagnosis not present

## 2014-11-09 DIAGNOSIS — R262 Difficulty in walking, not elsewhere classified: Secondary | ICD-10-CM | POA: Diagnosis not present

## 2014-11-09 DIAGNOSIS — M4005 Postural kyphosis, thoracolumbar region: Secondary | ICD-10-CM | POA: Diagnosis not present

## 2014-11-09 DIAGNOSIS — I10 Essential (primary) hypertension: Secondary | ICD-10-CM | POA: Diagnosis not present

## 2014-11-09 DIAGNOSIS — N186 End stage renal disease: Secondary | ICD-10-CM | POA: Diagnosis not present

## 2014-11-09 DIAGNOSIS — M6281 Muscle weakness (generalized): Secondary | ICD-10-CM | POA: Diagnosis not present

## 2014-11-11 DIAGNOSIS — E1129 Type 2 diabetes mellitus with other diabetic kidney complication: Secondary | ICD-10-CM | POA: Diagnosis not present

## 2014-11-11 DIAGNOSIS — N186 End stage renal disease: Secondary | ICD-10-CM | POA: Diagnosis not present

## 2014-11-11 DIAGNOSIS — D631 Anemia in chronic kidney disease: Secondary | ICD-10-CM | POA: Diagnosis not present

## 2014-11-11 DIAGNOSIS — D509 Iron deficiency anemia, unspecified: Secondary | ICD-10-CM | POA: Diagnosis not present

## 2014-11-12 DIAGNOSIS — N186 End stage renal disease: Secondary | ICD-10-CM | POA: Diagnosis not present

## 2014-11-12 DIAGNOSIS — M4005 Postural kyphosis, thoracolumbar region: Secondary | ICD-10-CM | POA: Diagnosis not present

## 2014-11-12 DIAGNOSIS — R262 Difficulty in walking, not elsewhere classified: Secondary | ICD-10-CM | POA: Diagnosis not present

## 2014-11-12 DIAGNOSIS — M6281 Muscle weakness (generalized): Secondary | ICD-10-CM | POA: Diagnosis not present

## 2014-11-12 DIAGNOSIS — I10 Essential (primary) hypertension: Secondary | ICD-10-CM | POA: Diagnosis not present

## 2014-11-13 DIAGNOSIS — N186 End stage renal disease: Secondary | ICD-10-CM | POA: Diagnosis not present

## 2014-11-13 DIAGNOSIS — E1129 Type 2 diabetes mellitus with other diabetic kidney complication: Secondary | ICD-10-CM | POA: Diagnosis not present

## 2014-11-13 DIAGNOSIS — D509 Iron deficiency anemia, unspecified: Secondary | ICD-10-CM | POA: Diagnosis not present

## 2014-11-13 DIAGNOSIS — D631 Anemia in chronic kidney disease: Secondary | ICD-10-CM | POA: Diagnosis not present

## 2014-11-14 DIAGNOSIS — M6281 Muscle weakness (generalized): Secondary | ICD-10-CM | POA: Diagnosis not present

## 2014-11-14 DIAGNOSIS — R262 Difficulty in walking, not elsewhere classified: Secondary | ICD-10-CM | POA: Diagnosis not present

## 2014-11-14 DIAGNOSIS — N186 End stage renal disease: Secondary | ICD-10-CM | POA: Diagnosis not present

## 2014-11-14 DIAGNOSIS — M4005 Postural kyphosis, thoracolumbar region: Secondary | ICD-10-CM | POA: Diagnosis not present

## 2014-11-14 DIAGNOSIS — I10 Essential (primary) hypertension: Secondary | ICD-10-CM | POA: Diagnosis not present

## 2014-11-15 DIAGNOSIS — D631 Anemia in chronic kidney disease: Secondary | ICD-10-CM | POA: Diagnosis not present

## 2014-11-15 DIAGNOSIS — D509 Iron deficiency anemia, unspecified: Secondary | ICD-10-CM | POA: Diagnosis not present

## 2014-11-15 DIAGNOSIS — E1129 Type 2 diabetes mellitus with other diabetic kidney complication: Secondary | ICD-10-CM | POA: Diagnosis not present

## 2014-11-15 DIAGNOSIS — N186 End stage renal disease: Secondary | ICD-10-CM | POA: Diagnosis not present

## 2014-11-16 DIAGNOSIS — M6281 Muscle weakness (generalized): Secondary | ICD-10-CM | POA: Diagnosis not present

## 2014-11-16 DIAGNOSIS — R262 Difficulty in walking, not elsewhere classified: Secondary | ICD-10-CM | POA: Diagnosis not present

## 2014-11-16 DIAGNOSIS — I10 Essential (primary) hypertension: Secondary | ICD-10-CM | POA: Diagnosis not present

## 2014-11-16 DIAGNOSIS — M4005 Postural kyphosis, thoracolumbar region: Secondary | ICD-10-CM | POA: Diagnosis not present

## 2014-11-16 DIAGNOSIS — N186 End stage renal disease: Secondary | ICD-10-CM | POA: Diagnosis not present

## 2014-11-18 DIAGNOSIS — D509 Iron deficiency anemia, unspecified: Secondary | ICD-10-CM | POA: Diagnosis not present

## 2014-11-18 DIAGNOSIS — D631 Anemia in chronic kidney disease: Secondary | ICD-10-CM | POA: Diagnosis not present

## 2014-11-18 DIAGNOSIS — N186 End stage renal disease: Secondary | ICD-10-CM | POA: Diagnosis not present

## 2014-11-18 DIAGNOSIS — E1129 Type 2 diabetes mellitus with other diabetic kidney complication: Secondary | ICD-10-CM | POA: Diagnosis not present

## 2014-11-19 DIAGNOSIS — R262 Difficulty in walking, not elsewhere classified: Secondary | ICD-10-CM | POA: Diagnosis not present

## 2014-11-19 DIAGNOSIS — M4005 Postural kyphosis, thoracolumbar region: Secondary | ICD-10-CM | POA: Diagnosis not present

## 2014-11-19 DIAGNOSIS — I10 Essential (primary) hypertension: Secondary | ICD-10-CM | POA: Diagnosis not present

## 2014-11-19 DIAGNOSIS — M6281 Muscle weakness (generalized): Secondary | ICD-10-CM | POA: Diagnosis not present

## 2014-11-19 DIAGNOSIS — N186 End stage renal disease: Secondary | ICD-10-CM | POA: Diagnosis not present

## 2014-11-20 DIAGNOSIS — N186 End stage renal disease: Secondary | ICD-10-CM | POA: Diagnosis not present

## 2014-11-20 DIAGNOSIS — D631 Anemia in chronic kidney disease: Secondary | ICD-10-CM | POA: Diagnosis not present

## 2014-11-20 DIAGNOSIS — D509 Iron deficiency anemia, unspecified: Secondary | ICD-10-CM | POA: Diagnosis not present

## 2014-11-20 DIAGNOSIS — E1129 Type 2 diabetes mellitus with other diabetic kidney complication: Secondary | ICD-10-CM | POA: Diagnosis not present

## 2014-11-21 DIAGNOSIS — R262 Difficulty in walking, not elsewhere classified: Secondary | ICD-10-CM | POA: Diagnosis not present

## 2014-11-21 DIAGNOSIS — M4005 Postural kyphosis, thoracolumbar region: Secondary | ICD-10-CM | POA: Diagnosis not present

## 2014-11-21 DIAGNOSIS — I10 Essential (primary) hypertension: Secondary | ICD-10-CM | POA: Diagnosis not present

## 2014-11-21 DIAGNOSIS — N186 End stage renal disease: Secondary | ICD-10-CM | POA: Diagnosis not present

## 2014-11-21 DIAGNOSIS — M6281 Muscle weakness (generalized): Secondary | ICD-10-CM | POA: Diagnosis not present

## 2014-11-22 ENCOUNTER — Telehealth: Payer: Self-pay | Admitting: Internal Medicine

## 2014-11-22 DIAGNOSIS — N186 End stage renal disease: Secondary | ICD-10-CM | POA: Diagnosis not present

## 2014-11-22 DIAGNOSIS — D509 Iron deficiency anemia, unspecified: Secondary | ICD-10-CM | POA: Diagnosis not present

## 2014-11-22 DIAGNOSIS — D631 Anemia in chronic kidney disease: Secondary | ICD-10-CM | POA: Diagnosis not present

## 2014-11-22 DIAGNOSIS — E1129 Type 2 diabetes mellitus with other diabetic kidney complication: Secondary | ICD-10-CM | POA: Diagnosis not present

## 2014-11-22 NOTE — Telephone Encounter (Signed)
Called pt left message regarding flu shot..[name]

## 2014-11-23 DIAGNOSIS — N186 End stage renal disease: Secondary | ICD-10-CM | POA: Diagnosis not present

## 2014-11-23 DIAGNOSIS — I10 Essential (primary) hypertension: Secondary | ICD-10-CM | POA: Diagnosis not present

## 2014-11-23 DIAGNOSIS — M4005 Postural kyphosis, thoracolumbar region: Secondary | ICD-10-CM | POA: Diagnosis not present

## 2014-11-23 DIAGNOSIS — R262 Difficulty in walking, not elsewhere classified: Secondary | ICD-10-CM | POA: Diagnosis not present

## 2014-11-23 DIAGNOSIS — M6281 Muscle weakness (generalized): Secondary | ICD-10-CM | POA: Diagnosis not present

## 2014-11-25 DIAGNOSIS — D631 Anemia in chronic kidney disease: Secondary | ICD-10-CM | POA: Diagnosis not present

## 2014-11-25 DIAGNOSIS — E1129 Type 2 diabetes mellitus with other diabetic kidney complication: Secondary | ICD-10-CM | POA: Diagnosis not present

## 2014-11-25 DIAGNOSIS — N186 End stage renal disease: Secondary | ICD-10-CM | POA: Diagnosis not present

## 2014-11-25 DIAGNOSIS — D509 Iron deficiency anemia, unspecified: Secondary | ICD-10-CM | POA: Diagnosis not present

## 2014-11-26 DIAGNOSIS — I10 Essential (primary) hypertension: Secondary | ICD-10-CM | POA: Diagnosis not present

## 2014-11-26 DIAGNOSIS — R262 Difficulty in walking, not elsewhere classified: Secondary | ICD-10-CM | POA: Diagnosis not present

## 2014-11-26 DIAGNOSIS — M6281 Muscle weakness (generalized): Secondary | ICD-10-CM | POA: Diagnosis not present

## 2014-11-26 DIAGNOSIS — M4005 Postural kyphosis, thoracolumbar region: Secondary | ICD-10-CM | POA: Diagnosis not present

## 2014-11-26 DIAGNOSIS — N186 End stage renal disease: Secondary | ICD-10-CM | POA: Diagnosis not present

## 2014-11-27 DIAGNOSIS — N186 End stage renal disease: Secondary | ICD-10-CM | POA: Diagnosis not present

## 2014-11-27 DIAGNOSIS — E1129 Type 2 diabetes mellitus with other diabetic kidney complication: Secondary | ICD-10-CM | POA: Diagnosis not present

## 2014-11-27 DIAGNOSIS — D631 Anemia in chronic kidney disease: Secondary | ICD-10-CM | POA: Diagnosis not present

## 2014-11-27 DIAGNOSIS — D509 Iron deficiency anemia, unspecified: Secondary | ICD-10-CM | POA: Diagnosis not present

## 2014-11-28 DIAGNOSIS — M4005 Postural kyphosis, thoracolumbar region: Secondary | ICD-10-CM | POA: Diagnosis not present

## 2014-11-28 DIAGNOSIS — N186 End stage renal disease: Secondary | ICD-10-CM | POA: Diagnosis not present

## 2014-11-28 DIAGNOSIS — I10 Essential (primary) hypertension: Secondary | ICD-10-CM | POA: Diagnosis not present

## 2014-11-28 DIAGNOSIS — R262 Difficulty in walking, not elsewhere classified: Secondary | ICD-10-CM | POA: Diagnosis not present

## 2014-11-28 DIAGNOSIS — M6281 Muscle weakness (generalized): Secondary | ICD-10-CM | POA: Diagnosis not present

## 2014-11-29 DIAGNOSIS — D509 Iron deficiency anemia, unspecified: Secondary | ICD-10-CM | POA: Diagnosis not present

## 2014-11-29 DIAGNOSIS — N186 End stage renal disease: Secondary | ICD-10-CM | POA: Diagnosis not present

## 2014-11-29 DIAGNOSIS — E1129 Type 2 diabetes mellitus with other diabetic kidney complication: Secondary | ICD-10-CM | POA: Diagnosis not present

## 2014-11-29 DIAGNOSIS — D631 Anemia in chronic kidney disease: Secondary | ICD-10-CM | POA: Diagnosis not present

## 2014-11-30 DIAGNOSIS — M4005 Postural kyphosis, thoracolumbar region: Secondary | ICD-10-CM | POA: Diagnosis not present

## 2014-11-30 DIAGNOSIS — R262 Difficulty in walking, not elsewhere classified: Secondary | ICD-10-CM | POA: Diagnosis not present

## 2014-11-30 DIAGNOSIS — I10 Essential (primary) hypertension: Secondary | ICD-10-CM | POA: Diagnosis not present

## 2014-11-30 DIAGNOSIS — N186 End stage renal disease: Secondary | ICD-10-CM | POA: Diagnosis not present

## 2014-11-30 DIAGNOSIS — M6281 Muscle weakness (generalized): Secondary | ICD-10-CM | POA: Diagnosis not present

## 2014-12-02 DIAGNOSIS — D509 Iron deficiency anemia, unspecified: Secondary | ICD-10-CM | POA: Diagnosis not present

## 2014-12-02 DIAGNOSIS — D631 Anemia in chronic kidney disease: Secondary | ICD-10-CM | POA: Diagnosis not present

## 2014-12-02 DIAGNOSIS — E1129 Type 2 diabetes mellitus with other diabetic kidney complication: Secondary | ICD-10-CM | POA: Diagnosis not present

## 2014-12-02 DIAGNOSIS — N186 End stage renal disease: Secondary | ICD-10-CM | POA: Diagnosis not present

## 2014-12-03 ENCOUNTER — Telehealth: Payer: Self-pay | Admitting: Diagnostic Neuroimaging

## 2014-12-03 DIAGNOSIS — N186 End stage renal disease: Secondary | ICD-10-CM | POA: Diagnosis not present

## 2014-12-03 DIAGNOSIS — M4005 Postural kyphosis, thoracolumbar region: Secondary | ICD-10-CM | POA: Diagnosis not present

## 2014-12-03 DIAGNOSIS — R262 Difficulty in walking, not elsewhere classified: Secondary | ICD-10-CM | POA: Diagnosis not present

## 2014-12-03 DIAGNOSIS — M6281 Muscle weakness (generalized): Secondary | ICD-10-CM | POA: Diagnosis not present

## 2014-12-03 DIAGNOSIS — I10 Essential (primary) hypertension: Secondary | ICD-10-CM | POA: Diagnosis not present

## 2014-12-03 NOTE — Telephone Encounter (Signed)
This Rx was sent to [facility] on 02/23.  I called the pharmacy.  Spoke with Fannie Knee.  She verified they do have Rx saved on file and will process it today.  They will notify patient when Rx is ready for pick up.  I called the patient back to advise.  Got no answer, no option to leave message.

## 2014-12-03 NOTE — Telephone Encounter (Signed)
Pt is calling stating [facility] on [address] said he doesn't have any refills for lacosamide (VIMPAT) 50 MG TABS tablet.  Pt is completely out. Please advise.

## 2014-12-04 DIAGNOSIS — N186 End stage renal disease: Secondary | ICD-10-CM | POA: Diagnosis not present

## 2014-12-04 DIAGNOSIS — E1129 Type 2 diabetes mellitus with other diabetic kidney complication: Secondary | ICD-10-CM | POA: Diagnosis not present

## 2014-12-04 DIAGNOSIS — D509 Iron deficiency anemia, unspecified: Secondary | ICD-10-CM | POA: Diagnosis not present

## 2014-12-04 DIAGNOSIS — D631 Anemia in chronic kidney disease: Secondary | ICD-10-CM | POA: Diagnosis not present

## 2014-12-05 DIAGNOSIS — I120 Hypertensive chronic kidney disease with stage 5 chronic kidney disease or end stage renal disease: Secondary | ICD-10-CM | POA: Diagnosis not present

## 2014-12-05 DIAGNOSIS — M6281 Muscle weakness (generalized): Secondary | ICD-10-CM | POA: Diagnosis not present

## 2014-12-05 DIAGNOSIS — M4005 Postural kyphosis, thoracolumbar region: Secondary | ICD-10-CM | POA: Diagnosis not present

## 2014-12-05 DIAGNOSIS — I10 Essential (primary) hypertension: Secondary | ICD-10-CM | POA: Diagnosis not present

## 2014-12-05 DIAGNOSIS — R262 Difficulty in walking, not elsewhere classified: Secondary | ICD-10-CM | POA: Diagnosis not present

## 2014-12-05 DIAGNOSIS — Z992 Dependence on renal dialysis: Secondary | ICD-10-CM | POA: Diagnosis not present

## 2014-12-05 DIAGNOSIS — N186 End stage renal disease: Secondary | ICD-10-CM | POA: Diagnosis not present

## 2014-12-06 DIAGNOSIS — D631 Anemia in chronic kidney disease: Secondary | ICD-10-CM | POA: Diagnosis not present

## 2014-12-06 DIAGNOSIS — E1129 Type 2 diabetes mellitus with other diabetic kidney complication: Secondary | ICD-10-CM | POA: Diagnosis not present

## 2014-12-06 DIAGNOSIS — N186 End stage renal disease: Secondary | ICD-10-CM | POA: Diagnosis not present

## 2014-12-07 DIAGNOSIS — N186 End stage renal disease: Secondary | ICD-10-CM | POA: Diagnosis not present

## 2014-12-07 DIAGNOSIS — R262 Difficulty in walking, not elsewhere classified: Secondary | ICD-10-CM | POA: Diagnosis not present

## 2014-12-07 DIAGNOSIS — M4005 Postural kyphosis, thoracolumbar region: Secondary | ICD-10-CM | POA: Diagnosis not present

## 2014-12-07 DIAGNOSIS — M6281 Muscle weakness (generalized): Secondary | ICD-10-CM | POA: Diagnosis not present

## 2014-12-07 DIAGNOSIS — I10 Essential (primary) hypertension: Secondary | ICD-10-CM | POA: Diagnosis not present

## 2014-12-09 ENCOUNTER — Other Ambulatory Visit: Payer: Self-pay | Admitting: *Deleted

## 2014-12-09 DIAGNOSIS — D631 Anemia in chronic kidney disease: Secondary | ICD-10-CM | POA: Diagnosis not present

## 2014-12-09 DIAGNOSIS — N186 End stage renal disease: Secondary | ICD-10-CM | POA: Diagnosis not present

## 2014-12-09 DIAGNOSIS — E1129 Type 2 diabetes mellitus with other diabetic kidney complication: Secondary | ICD-10-CM | POA: Diagnosis not present

## 2014-12-09 DIAGNOSIS — M5136 Other intervertebral disc degeneration, lumbar region: Secondary | ICD-10-CM

## 2014-12-09 MED ORDER — GABAPENTIN 100 MG PO CAPS: 100.0000 mg | ORAL_CAPSULE | Freq: Three times a day (TID) | ORAL | Status: DC

## 2014-12-09 NOTE — Telephone Encounter (Signed)
[facility] [address] 

## 2014-12-10 DIAGNOSIS — M6281 Muscle weakness (generalized): Secondary | ICD-10-CM | POA: Diagnosis not present

## 2014-12-10 DIAGNOSIS — M4005 Postural kyphosis, thoracolumbar region: Secondary | ICD-10-CM | POA: Diagnosis not present

## 2014-12-10 DIAGNOSIS — N186 End stage renal disease: Secondary | ICD-10-CM | POA: Diagnosis not present

## 2014-12-10 DIAGNOSIS — I10 Essential (primary) hypertension: Secondary | ICD-10-CM | POA: Diagnosis not present

## 2014-12-10 DIAGNOSIS — R262 Difficulty in walking, not elsewhere classified: Secondary | ICD-10-CM | POA: Diagnosis not present

## 2014-12-11 DIAGNOSIS — N186 End stage renal disease: Secondary | ICD-10-CM | POA: Diagnosis not present

## 2014-12-11 DIAGNOSIS — E1129 Type 2 diabetes mellitus with other diabetic kidney complication: Secondary | ICD-10-CM | POA: Diagnosis not present

## 2014-12-11 DIAGNOSIS — D631 Anemia in chronic kidney disease: Secondary | ICD-10-CM | POA: Diagnosis not present

## 2014-12-12 DIAGNOSIS — N186 End stage renal disease: Secondary | ICD-10-CM | POA: Diagnosis not present

## 2014-12-12 DIAGNOSIS — M4005 Postural kyphosis, thoracolumbar region: Secondary | ICD-10-CM | POA: Diagnosis not present

## 2014-12-12 DIAGNOSIS — I10 Essential (primary) hypertension: Secondary | ICD-10-CM | POA: Diagnosis not present

## 2014-12-12 DIAGNOSIS — Z0000 Encounter for general adult medical examination without abnormal findings: Secondary | ICD-10-CM | POA: Diagnosis not present

## 2014-12-12 DIAGNOSIS — R262 Difficulty in walking, not elsewhere classified: Secondary | ICD-10-CM | POA: Diagnosis not present

## 2014-12-12 DIAGNOSIS — R7309 Other abnormal glucose: Secondary | ICD-10-CM | POA: Diagnosis not present

## 2014-12-12 DIAGNOSIS — M6281 Muscle weakness (generalized): Secondary | ICD-10-CM | POA: Diagnosis not present

## 2014-12-13 DIAGNOSIS — D631 Anemia in chronic kidney disease: Secondary | ICD-10-CM | POA: Diagnosis not present

## 2014-12-13 DIAGNOSIS — E1129 Type 2 diabetes mellitus with other diabetic kidney complication: Secondary | ICD-10-CM | POA: Diagnosis not present

## 2014-12-13 DIAGNOSIS — N186 End stage renal disease: Secondary | ICD-10-CM | POA: Diagnosis not present

## 2014-12-14 DIAGNOSIS — N186 End stage renal disease: Secondary | ICD-10-CM | POA: Diagnosis not present

## 2014-12-14 DIAGNOSIS — I10 Essential (primary) hypertension: Secondary | ICD-10-CM | POA: Diagnosis not present

## 2014-12-14 DIAGNOSIS — R262 Difficulty in walking, not elsewhere classified: Secondary | ICD-10-CM | POA: Diagnosis not present

## 2014-12-14 DIAGNOSIS — M6281 Muscle weakness (generalized): Secondary | ICD-10-CM | POA: Diagnosis not present

## 2014-12-14 DIAGNOSIS — M4005 Postural kyphosis, thoracolumbar region: Secondary | ICD-10-CM | POA: Diagnosis not present

## 2014-12-16 DIAGNOSIS — N186 End stage renal disease: Secondary | ICD-10-CM | POA: Diagnosis not present

## 2014-12-16 DIAGNOSIS — D631 Anemia in chronic kidney disease: Secondary | ICD-10-CM | POA: Diagnosis not present

## 2014-12-16 DIAGNOSIS — E1129 Type 2 diabetes mellitus with other diabetic kidney complication: Secondary | ICD-10-CM | POA: Diagnosis not present

## 2014-12-17 DIAGNOSIS — R262 Difficulty in walking, not elsewhere classified: Secondary | ICD-10-CM | POA: Diagnosis not present

## 2014-12-17 DIAGNOSIS — I10 Essential (primary) hypertension: Secondary | ICD-10-CM | POA: Diagnosis not present

## 2014-12-17 DIAGNOSIS — N186 End stage renal disease: Secondary | ICD-10-CM | POA: Diagnosis not present

## 2014-12-17 DIAGNOSIS — M6281 Muscle weakness (generalized): Secondary | ICD-10-CM | POA: Diagnosis not present

## 2014-12-17 DIAGNOSIS — M4005 Postural kyphosis, thoracolumbar region: Secondary | ICD-10-CM | POA: Diagnosis not present

## 2014-12-18 DIAGNOSIS — E1129 Type 2 diabetes mellitus with other diabetic kidney complication: Secondary | ICD-10-CM | POA: Diagnosis not present

## 2014-12-18 DIAGNOSIS — D631 Anemia in chronic kidney disease: Secondary | ICD-10-CM | POA: Diagnosis not present

## 2014-12-18 DIAGNOSIS — N186 End stage renal disease: Secondary | ICD-10-CM | POA: Diagnosis not present

## 2014-12-19 DIAGNOSIS — M6281 Muscle weakness (generalized): Secondary | ICD-10-CM | POA: Diagnosis not present

## 2014-12-19 DIAGNOSIS — M4005 Postural kyphosis, thoracolumbar region: Secondary | ICD-10-CM | POA: Diagnosis not present

## 2014-12-19 DIAGNOSIS — I10 Essential (primary) hypertension: Secondary | ICD-10-CM | POA: Diagnosis not present

## 2014-12-19 DIAGNOSIS — R262 Difficulty in walking, not elsewhere classified: Secondary | ICD-10-CM | POA: Diagnosis not present

## 2014-12-19 DIAGNOSIS — N186 End stage renal disease: Secondary | ICD-10-CM | POA: Diagnosis not present

## 2014-12-20 DIAGNOSIS — E1129 Type 2 diabetes mellitus with other diabetic kidney complication: Secondary | ICD-10-CM | POA: Diagnosis not present

## 2014-12-20 DIAGNOSIS — D631 Anemia in chronic kidney disease: Secondary | ICD-10-CM | POA: Diagnosis not present

## 2014-12-20 DIAGNOSIS — N186 End stage renal disease: Secondary | ICD-10-CM | POA: Diagnosis not present

## 2014-12-21 DIAGNOSIS — I10 Essential (primary) hypertension: Secondary | ICD-10-CM | POA: Diagnosis not present

## 2014-12-21 DIAGNOSIS — R262 Difficulty in walking, not elsewhere classified: Secondary | ICD-10-CM | POA: Diagnosis not present

## 2014-12-21 DIAGNOSIS — N186 End stage renal disease: Secondary | ICD-10-CM | POA: Diagnosis not present

## 2014-12-21 DIAGNOSIS — M6281 Muscle weakness (generalized): Secondary | ICD-10-CM | POA: Diagnosis not present

## 2014-12-21 DIAGNOSIS — M4005 Postural kyphosis, thoracolumbar region: Secondary | ICD-10-CM | POA: Diagnosis not present

## 2014-12-23 DIAGNOSIS — D631 Anemia in chronic kidney disease: Secondary | ICD-10-CM | POA: Diagnosis not present

## 2014-12-23 DIAGNOSIS — N186 End stage renal disease: Secondary | ICD-10-CM | POA: Diagnosis not present

## 2014-12-23 DIAGNOSIS — E1129 Type 2 diabetes mellitus with other diabetic kidney complication: Secondary | ICD-10-CM | POA: Diagnosis not present

## 2014-12-24 DIAGNOSIS — I10 Essential (primary) hypertension: Secondary | ICD-10-CM | POA: Diagnosis not present

## 2014-12-24 DIAGNOSIS — M4005 Postural kyphosis, thoracolumbar region: Secondary | ICD-10-CM | POA: Diagnosis not present

## 2014-12-24 DIAGNOSIS — N186 End stage renal disease: Secondary | ICD-10-CM | POA: Diagnosis not present

## 2014-12-24 DIAGNOSIS — M6281 Muscle weakness (generalized): Secondary | ICD-10-CM | POA: Diagnosis not present

## 2014-12-24 DIAGNOSIS — R262 Difficulty in walking, not elsewhere classified: Secondary | ICD-10-CM | POA: Diagnosis not present

## 2014-12-25 DIAGNOSIS — N186 End stage renal disease: Secondary | ICD-10-CM | POA: Diagnosis not present

## 2014-12-25 DIAGNOSIS — E1129 Type 2 diabetes mellitus with other diabetic kidney complication: Secondary | ICD-10-CM | POA: Diagnosis not present

## 2014-12-25 DIAGNOSIS — D631 Anemia in chronic kidney disease: Secondary | ICD-10-CM | POA: Diagnosis not present

## 2014-12-26 DIAGNOSIS — M4005 Postural kyphosis, thoracolumbar region: Secondary | ICD-10-CM | POA: Diagnosis not present

## 2014-12-26 DIAGNOSIS — N189 Chronic kidney disease, unspecified: Secondary | ICD-10-CM | POA: Diagnosis not present

## 2014-12-26 DIAGNOSIS — E0822 Diabetes mellitus due to underlying condition with diabetic chronic kidney disease: Secondary | ICD-10-CM | POA: Diagnosis not present

## 2014-12-26 DIAGNOSIS — M6281 Muscle weakness (generalized): Secondary | ICD-10-CM | POA: Diagnosis not present

## 2014-12-26 DIAGNOSIS — N186 End stage renal disease: Secondary | ICD-10-CM | POA: Diagnosis not present

## 2014-12-26 DIAGNOSIS — I10 Essential (primary) hypertension: Secondary | ICD-10-CM | POA: Diagnosis not present

## 2014-12-26 DIAGNOSIS — R262 Difficulty in walking, not elsewhere classified: Secondary | ICD-10-CM | POA: Diagnosis not present

## 2014-12-27 DIAGNOSIS — N186 End stage renal disease: Secondary | ICD-10-CM | POA: Diagnosis not present

## 2014-12-27 DIAGNOSIS — D631 Anemia in chronic kidney disease: Secondary | ICD-10-CM | POA: Diagnosis not present

## 2014-12-27 DIAGNOSIS — E1129 Type 2 diabetes mellitus with other diabetic kidney complication: Secondary | ICD-10-CM | POA: Diagnosis not present

## 2014-12-28 DIAGNOSIS — R262 Difficulty in walking, not elsewhere classified: Secondary | ICD-10-CM | POA: Diagnosis not present

## 2014-12-28 DIAGNOSIS — I10 Essential (primary) hypertension: Secondary | ICD-10-CM | POA: Diagnosis not present

## 2014-12-28 DIAGNOSIS — N186 End stage renal disease: Secondary | ICD-10-CM | POA: Diagnosis not present

## 2014-12-28 DIAGNOSIS — M6281 Muscle weakness (generalized): Secondary | ICD-10-CM | POA: Diagnosis not present

## 2014-12-28 DIAGNOSIS — M4005 Postural kyphosis, thoracolumbar region: Secondary | ICD-10-CM | POA: Diagnosis not present

## 2014-12-30 DIAGNOSIS — N186 End stage renal disease: Secondary | ICD-10-CM | POA: Diagnosis not present

## 2014-12-30 DIAGNOSIS — D631 Anemia in chronic kidney disease: Secondary | ICD-10-CM | POA: Diagnosis not present

## 2014-12-30 DIAGNOSIS — E1129 Type 2 diabetes mellitus with other diabetic kidney complication: Secondary | ICD-10-CM | POA: Diagnosis not present

## 2014-12-31 DIAGNOSIS — R262 Difficulty in walking, not elsewhere classified: Secondary | ICD-10-CM | POA: Diagnosis not present

## 2014-12-31 DIAGNOSIS — M4005 Postural kyphosis, thoracolumbar region: Secondary | ICD-10-CM | POA: Diagnosis not present

## 2014-12-31 DIAGNOSIS — I10 Essential (primary) hypertension: Secondary | ICD-10-CM | POA: Diagnosis not present

## 2014-12-31 DIAGNOSIS — N186 End stage renal disease: Secondary | ICD-10-CM | POA: Diagnosis not present

## 2014-12-31 DIAGNOSIS — M6281 Muscle weakness (generalized): Secondary | ICD-10-CM | POA: Diagnosis not present

## 2015-01-01 DIAGNOSIS — E1129 Type 2 diabetes mellitus with other diabetic kidney complication: Secondary | ICD-10-CM | POA: Diagnosis not present

## 2015-01-01 DIAGNOSIS — D631 Anemia in chronic kidney disease: Secondary | ICD-10-CM | POA: Diagnosis not present

## 2015-01-01 DIAGNOSIS — N186 End stage renal disease: Secondary | ICD-10-CM | POA: Diagnosis not present

## 2015-01-02 DIAGNOSIS — R262 Difficulty in walking, not elsewhere classified: Secondary | ICD-10-CM | POA: Diagnosis not present

## 2015-01-02 DIAGNOSIS — M6281 Muscle weakness (generalized): Secondary | ICD-10-CM | POA: Diagnosis not present

## 2015-01-02 DIAGNOSIS — I10 Essential (primary) hypertension: Secondary | ICD-10-CM | POA: Diagnosis not present

## 2015-01-02 DIAGNOSIS — N186 End stage renal disease: Secondary | ICD-10-CM | POA: Diagnosis not present

## 2015-01-02 DIAGNOSIS — M4005 Postural kyphosis, thoracolumbar region: Secondary | ICD-10-CM | POA: Diagnosis not present

## 2015-01-03 DIAGNOSIS — N186 End stage renal disease: Secondary | ICD-10-CM | POA: Diagnosis not present

## 2015-01-03 DIAGNOSIS — D631 Anemia in chronic kidney disease: Secondary | ICD-10-CM | POA: Diagnosis not present

## 2015-01-03 DIAGNOSIS — E1129 Type 2 diabetes mellitus with other diabetic kidney complication: Secondary | ICD-10-CM | POA: Diagnosis not present

## 2015-01-04 DIAGNOSIS — I10 Essential (primary) hypertension: Secondary | ICD-10-CM | POA: Diagnosis not present

## 2015-01-04 DIAGNOSIS — N186 End stage renal disease: Secondary | ICD-10-CM | POA: Diagnosis not present

## 2015-01-04 DIAGNOSIS — I120 Hypertensive chronic kidney disease with stage 5 chronic kidney disease or end stage renal disease: Secondary | ICD-10-CM | POA: Diagnosis not present

## 2015-01-04 DIAGNOSIS — M4005 Postural kyphosis, thoracolumbar region: Secondary | ICD-10-CM | POA: Diagnosis not present

## 2015-01-04 DIAGNOSIS — M6281 Muscle weakness (generalized): Secondary | ICD-10-CM | POA: Diagnosis not present

## 2015-01-04 DIAGNOSIS — R262 Difficulty in walking, not elsewhere classified: Secondary | ICD-10-CM | POA: Diagnosis not present

## 2015-01-04 DIAGNOSIS — Z992 Dependence on renal dialysis: Secondary | ICD-10-CM | POA: Diagnosis not present

## 2015-01-06 DIAGNOSIS — D631 Anemia in chronic kidney disease: Secondary | ICD-10-CM | POA: Diagnosis not present

## 2015-01-06 DIAGNOSIS — N186 End stage renal disease: Secondary | ICD-10-CM | POA: Diagnosis not present

## 2015-01-06 DIAGNOSIS — Z23 Encounter for immunization: Secondary | ICD-10-CM | POA: Diagnosis not present

## 2015-01-06 DIAGNOSIS — D509 Iron deficiency anemia, unspecified: Secondary | ICD-10-CM | POA: Diagnosis not present

## 2015-01-07 DIAGNOSIS — I10 Essential (primary) hypertension: Secondary | ICD-10-CM | POA: Diagnosis not present

## 2015-01-07 DIAGNOSIS — M6281 Muscle weakness (generalized): Secondary | ICD-10-CM | POA: Diagnosis not present

## 2015-01-07 DIAGNOSIS — R262 Difficulty in walking, not elsewhere classified: Secondary | ICD-10-CM | POA: Diagnosis not present

## 2015-01-07 DIAGNOSIS — N186 End stage renal disease: Secondary | ICD-10-CM | POA: Diagnosis not present

## 2015-01-07 DIAGNOSIS — M4005 Postural kyphosis, thoracolumbar region: Secondary | ICD-10-CM | POA: Diagnosis not present

## 2015-01-08 DIAGNOSIS — D509 Iron deficiency anemia, unspecified: Secondary | ICD-10-CM | POA: Diagnosis not present

## 2015-01-08 DIAGNOSIS — Z23 Encounter for immunization: Secondary | ICD-10-CM | POA: Diagnosis not present

## 2015-01-08 DIAGNOSIS — N186 End stage renal disease: Secondary | ICD-10-CM | POA: Diagnosis not present

## 2015-01-08 DIAGNOSIS — D631 Anemia in chronic kidney disease: Secondary | ICD-10-CM | POA: Diagnosis not present

## 2015-01-09 DIAGNOSIS — R262 Difficulty in walking, not elsewhere classified: Secondary | ICD-10-CM | POA: Diagnosis not present

## 2015-01-09 DIAGNOSIS — M6281 Muscle weakness (generalized): Secondary | ICD-10-CM | POA: Diagnosis not present

## 2015-01-09 DIAGNOSIS — M4005 Postural kyphosis, thoracolumbar region: Secondary | ICD-10-CM | POA: Diagnosis not present

## 2015-01-09 DIAGNOSIS — I10 Essential (primary) hypertension: Secondary | ICD-10-CM | POA: Diagnosis not present

## 2015-01-09 DIAGNOSIS — N186 End stage renal disease: Secondary | ICD-10-CM | POA: Diagnosis not present

## 2015-01-10 DIAGNOSIS — Z23 Encounter for immunization: Secondary | ICD-10-CM | POA: Diagnosis not present

## 2015-01-10 DIAGNOSIS — D631 Anemia in chronic kidney disease: Secondary | ICD-10-CM | POA: Diagnosis not present

## 2015-01-10 DIAGNOSIS — D509 Iron deficiency anemia, unspecified: Secondary | ICD-10-CM | POA: Diagnosis not present

## 2015-01-10 DIAGNOSIS — N186 End stage renal disease: Secondary | ICD-10-CM | POA: Diagnosis not present

## 2015-01-11 DIAGNOSIS — N186 End stage renal disease: Secondary | ICD-10-CM | POA: Diagnosis not present

## 2015-01-11 DIAGNOSIS — M4005 Postural kyphosis, thoracolumbar region: Secondary | ICD-10-CM | POA: Diagnosis not present

## 2015-01-11 DIAGNOSIS — I10 Essential (primary) hypertension: Secondary | ICD-10-CM | POA: Diagnosis not present

## 2015-01-11 DIAGNOSIS — R262 Difficulty in walking, not elsewhere classified: Secondary | ICD-10-CM | POA: Diagnosis not present

## 2015-01-11 DIAGNOSIS — M6281 Muscle weakness (generalized): Secondary | ICD-10-CM | POA: Diagnosis not present

## 2015-01-13 DIAGNOSIS — N186 End stage renal disease: Secondary | ICD-10-CM | POA: Diagnosis not present

## 2015-01-13 DIAGNOSIS — D631 Anemia in chronic kidney disease: Secondary | ICD-10-CM | POA: Diagnosis not present

## 2015-01-13 DIAGNOSIS — D509 Iron deficiency anemia, unspecified: Secondary | ICD-10-CM | POA: Diagnosis not present

## 2015-01-13 DIAGNOSIS — Z23 Encounter for immunization: Secondary | ICD-10-CM | POA: Diagnosis not present

## 2015-01-14 DIAGNOSIS — R262 Difficulty in walking, not elsewhere classified: Secondary | ICD-10-CM | POA: Diagnosis not present

## 2015-01-14 DIAGNOSIS — I10 Essential (primary) hypertension: Secondary | ICD-10-CM | POA: Diagnosis not present

## 2015-01-14 DIAGNOSIS — M6281 Muscle weakness (generalized): Secondary | ICD-10-CM | POA: Diagnosis not present

## 2015-01-14 DIAGNOSIS — M4005 Postural kyphosis, thoracolumbar region: Secondary | ICD-10-CM | POA: Diagnosis not present

## 2015-01-14 DIAGNOSIS — N186 End stage renal disease: Secondary | ICD-10-CM | POA: Diagnosis not present

## 2015-01-15 DIAGNOSIS — Z23 Encounter for immunization: Secondary | ICD-10-CM | POA: Diagnosis not present

## 2015-01-15 DIAGNOSIS — N186 End stage renal disease: Secondary | ICD-10-CM | POA: Diagnosis not present

## 2015-01-15 DIAGNOSIS — D509 Iron deficiency anemia, unspecified: Secondary | ICD-10-CM | POA: Diagnosis not present

## 2015-01-15 DIAGNOSIS — D631 Anemia in chronic kidney disease: Secondary | ICD-10-CM | POA: Diagnosis not present

## 2015-01-16 DIAGNOSIS — N186 End stage renal disease: Secondary | ICD-10-CM | POA: Diagnosis not present

## 2015-01-16 DIAGNOSIS — I10 Essential (primary) hypertension: Secondary | ICD-10-CM | POA: Diagnosis not present

## 2015-01-16 DIAGNOSIS — M4005 Postural kyphosis, thoracolumbar region: Secondary | ICD-10-CM | POA: Diagnosis not present

## 2015-01-16 DIAGNOSIS — R262 Difficulty in walking, not elsewhere classified: Secondary | ICD-10-CM | POA: Diagnosis not present

## 2015-01-16 DIAGNOSIS — M6281 Muscle weakness (generalized): Secondary | ICD-10-CM | POA: Diagnosis not present

## 2015-01-17 DIAGNOSIS — N186 End stage renal disease: Secondary | ICD-10-CM | POA: Diagnosis not present

## 2015-01-17 DIAGNOSIS — D631 Anemia in chronic kidney disease: Secondary | ICD-10-CM | POA: Diagnosis not present

## 2015-01-17 DIAGNOSIS — D509 Iron deficiency anemia, unspecified: Secondary | ICD-10-CM | POA: Diagnosis not present

## 2015-01-17 DIAGNOSIS — Z23 Encounter for immunization: Secondary | ICD-10-CM | POA: Diagnosis not present

## 2015-01-18 DIAGNOSIS — N186 End stage renal disease: Secondary | ICD-10-CM | POA: Diagnosis not present

## 2015-01-18 DIAGNOSIS — I10 Essential (primary) hypertension: Secondary | ICD-10-CM | POA: Diagnosis not present

## 2015-01-18 DIAGNOSIS — R262 Difficulty in walking, not elsewhere classified: Secondary | ICD-10-CM | POA: Diagnosis not present

## 2015-01-18 DIAGNOSIS — M4005 Postural kyphosis, thoracolumbar region: Secondary | ICD-10-CM | POA: Diagnosis not present

## 2015-01-18 DIAGNOSIS — M6281 Muscle weakness (generalized): Secondary | ICD-10-CM | POA: Diagnosis not present

## 2015-01-20 DIAGNOSIS — Z23 Encounter for immunization: Secondary | ICD-10-CM | POA: Diagnosis not present

## 2015-01-20 DIAGNOSIS — D631 Anemia in chronic kidney disease: Secondary | ICD-10-CM | POA: Diagnosis not present

## 2015-01-20 DIAGNOSIS — N186 End stage renal disease: Secondary | ICD-10-CM | POA: Diagnosis not present

## 2015-01-20 DIAGNOSIS — D509 Iron deficiency anemia, unspecified: Secondary | ICD-10-CM | POA: Diagnosis not present

## 2015-01-21 ENCOUNTER — Ambulatory Visit (INDEPENDENT_AMBULATORY_CARE_PROVIDER_SITE_OTHER): Payer: Medicare Other | Admitting: Podiatry

## 2015-01-21 DIAGNOSIS — M79676 Pain in unspecified toe(s): Secondary | ICD-10-CM

## 2015-01-21 DIAGNOSIS — M6281 Muscle weakness (generalized): Secondary | ICD-10-CM | POA: Diagnosis not present

## 2015-01-21 DIAGNOSIS — E119 Type 2 diabetes mellitus without complications: Secondary | ICD-10-CM | POA: Diagnosis not present

## 2015-01-21 DIAGNOSIS — B351 Tinea unguium: Secondary | ICD-10-CM

## 2015-01-21 DIAGNOSIS — N186 End stage renal disease: Secondary | ICD-10-CM | POA: Diagnosis not present

## 2015-01-21 DIAGNOSIS — I10 Essential (primary) hypertension: Secondary | ICD-10-CM | POA: Diagnosis not present

## 2015-01-21 DIAGNOSIS — R262 Difficulty in walking, not elsewhere classified: Secondary | ICD-10-CM | POA: Diagnosis not present

## 2015-01-21 DIAGNOSIS — M4005 Postural kyphosis, thoracolumbar region: Secondary | ICD-10-CM | POA: Diagnosis not present

## 2015-01-21 DIAGNOSIS — E1140 Type 2 diabetes mellitus with diabetic neuropathy, unspecified: Secondary | ICD-10-CM

## 2015-01-21 NOTE — Progress Notes (Signed)
   Subjective:    Patient ID: [name], male    DOB: 1932-05-30, 81 y.o.   MRN: [id number]  HPI Comments: Pt states he is here to have his toenails cut, but his legs are cramping when he's at dialysis.  HPI Presents today chief complaint of painful elongated toenails.  Objective: Pulses are palpable bilateral nails are thick, yellow dystrophic onychomycosis and painful palpation.   Assessment: Onychomycosis with pain in limb.  Plan: Treatment of nails in thickness and length as covered service secondary to pain.    Review of Systems     Objective:   Physical Exam        Assessment & Plan:

## 2015-01-22 DIAGNOSIS — D509 Iron deficiency anemia, unspecified: Secondary | ICD-10-CM | POA: Diagnosis not present

## 2015-01-22 DIAGNOSIS — N186 End stage renal disease: Secondary | ICD-10-CM | POA: Diagnosis not present

## 2015-01-22 DIAGNOSIS — Z23 Encounter for immunization: Secondary | ICD-10-CM | POA: Diagnosis not present

## 2015-01-22 DIAGNOSIS — D631 Anemia in chronic kidney disease: Secondary | ICD-10-CM | POA: Diagnosis not present

## 2015-01-23 DIAGNOSIS — R262 Difficulty in walking, not elsewhere classified: Secondary | ICD-10-CM | POA: Diagnosis not present

## 2015-01-23 DIAGNOSIS — I10 Essential (primary) hypertension: Secondary | ICD-10-CM | POA: Diagnosis not present

## 2015-01-23 DIAGNOSIS — M4005 Postural kyphosis, thoracolumbar region: Secondary | ICD-10-CM | POA: Diagnosis not present

## 2015-01-23 DIAGNOSIS — N186 End stage renal disease: Secondary | ICD-10-CM | POA: Diagnosis not present

## 2015-01-23 DIAGNOSIS — M6281 Muscle weakness (generalized): Secondary | ICD-10-CM | POA: Diagnosis not present

## 2015-01-24 DIAGNOSIS — N186 End stage renal disease: Secondary | ICD-10-CM | POA: Diagnosis not present

## 2015-01-24 DIAGNOSIS — D509 Iron deficiency anemia, unspecified: Secondary | ICD-10-CM | POA: Diagnosis not present

## 2015-01-24 DIAGNOSIS — Z23 Encounter for immunization: Secondary | ICD-10-CM | POA: Diagnosis not present

## 2015-01-24 DIAGNOSIS — D631 Anemia in chronic kidney disease: Secondary | ICD-10-CM | POA: Diagnosis not present

## 2015-01-25 DIAGNOSIS — R262 Difficulty in walking, not elsewhere classified: Secondary | ICD-10-CM | POA: Diagnosis not present

## 2015-01-25 DIAGNOSIS — N186 End stage renal disease: Secondary | ICD-10-CM | POA: Diagnosis not present

## 2015-01-25 DIAGNOSIS — M4005 Postural kyphosis, thoracolumbar region: Secondary | ICD-10-CM | POA: Diagnosis not present

## 2015-01-25 DIAGNOSIS — I10 Essential (primary) hypertension: Secondary | ICD-10-CM | POA: Diagnosis not present

## 2015-01-25 DIAGNOSIS — M6281 Muscle weakness (generalized): Secondary | ICD-10-CM | POA: Diagnosis not present

## 2015-01-27 DIAGNOSIS — E119 Type 2 diabetes mellitus without complications: Secondary | ICD-10-CM | POA: Diagnosis not present

## 2015-01-27 DIAGNOSIS — D631 Anemia in chronic kidney disease: Secondary | ICD-10-CM | POA: Diagnosis not present

## 2015-01-27 DIAGNOSIS — N186 End stage renal disease: Secondary | ICD-10-CM | POA: Diagnosis not present

## 2015-01-27 DIAGNOSIS — D509 Iron deficiency anemia, unspecified: Secondary | ICD-10-CM | POA: Diagnosis not present

## 2015-01-27 DIAGNOSIS — I10 Essential (primary) hypertension: Secondary | ICD-10-CM | POA: Diagnosis not present

## 2015-01-27 DIAGNOSIS — Z23 Encounter for immunization: Secondary | ICD-10-CM | POA: Diagnosis not present

## 2015-01-28 DIAGNOSIS — M4005 Postural kyphosis, thoracolumbar region: Secondary | ICD-10-CM | POA: Diagnosis not present

## 2015-01-28 DIAGNOSIS — N186 End stage renal disease: Secondary | ICD-10-CM | POA: Diagnosis not present

## 2015-01-28 DIAGNOSIS — R262 Difficulty in walking, not elsewhere classified: Secondary | ICD-10-CM | POA: Diagnosis not present

## 2015-01-28 DIAGNOSIS — M6281 Muscle weakness (generalized): Secondary | ICD-10-CM | POA: Diagnosis not present

## 2015-01-28 DIAGNOSIS — I10 Essential (primary) hypertension: Secondary | ICD-10-CM | POA: Diagnosis not present

## 2015-01-29 DIAGNOSIS — D631 Anemia in chronic kidney disease: Secondary | ICD-10-CM | POA: Diagnosis not present

## 2015-01-29 DIAGNOSIS — Z23 Encounter for immunization: Secondary | ICD-10-CM | POA: Diagnosis not present

## 2015-01-29 DIAGNOSIS — N186 End stage renal disease: Secondary | ICD-10-CM | POA: Diagnosis not present

## 2015-01-29 DIAGNOSIS — D509 Iron deficiency anemia, unspecified: Secondary | ICD-10-CM | POA: Diagnosis not present

## 2015-01-30 DIAGNOSIS — R262 Difficulty in walking, not elsewhere classified: Secondary | ICD-10-CM | POA: Diagnosis not present

## 2015-01-30 DIAGNOSIS — N186 End stage renal disease: Secondary | ICD-10-CM | POA: Diagnosis not present

## 2015-01-30 DIAGNOSIS — M6281 Muscle weakness (generalized): Secondary | ICD-10-CM | POA: Diagnosis not present

## 2015-01-30 DIAGNOSIS — M4005 Postural kyphosis, thoracolumbar region: Secondary | ICD-10-CM | POA: Diagnosis not present

## 2015-01-30 DIAGNOSIS — I10 Essential (primary) hypertension: Secondary | ICD-10-CM | POA: Diagnosis not present

## 2015-01-31 DIAGNOSIS — Z23 Encounter for immunization: Secondary | ICD-10-CM | POA: Diagnosis not present

## 2015-01-31 DIAGNOSIS — N186 End stage renal disease: Secondary | ICD-10-CM | POA: Diagnosis not present

## 2015-01-31 DIAGNOSIS — D509 Iron deficiency anemia, unspecified: Secondary | ICD-10-CM | POA: Diagnosis not present

## 2015-01-31 DIAGNOSIS — D631 Anemia in chronic kidney disease: Secondary | ICD-10-CM | POA: Diagnosis not present

## 2015-02-01 DIAGNOSIS — M4005 Postural kyphosis, thoracolumbar region: Secondary | ICD-10-CM | POA: Diagnosis not present

## 2015-02-01 DIAGNOSIS — R262 Difficulty in walking, not elsewhere classified: Secondary | ICD-10-CM | POA: Diagnosis not present

## 2015-02-01 DIAGNOSIS — N186 End stage renal disease: Secondary | ICD-10-CM | POA: Diagnosis not present

## 2015-02-01 DIAGNOSIS — I10 Essential (primary) hypertension: Secondary | ICD-10-CM | POA: Diagnosis not present

## 2015-02-01 DIAGNOSIS — M6281 Muscle weakness (generalized): Secondary | ICD-10-CM | POA: Diagnosis not present

## 2015-02-03 DIAGNOSIS — Z23 Encounter for immunization: Secondary | ICD-10-CM | POA: Diagnosis not present

## 2015-02-03 DIAGNOSIS — N186 End stage renal disease: Secondary | ICD-10-CM | POA: Diagnosis not present

## 2015-02-03 DIAGNOSIS — D631 Anemia in chronic kidney disease: Secondary | ICD-10-CM | POA: Diagnosis not present

## 2015-02-03 DIAGNOSIS — D509 Iron deficiency anemia, unspecified: Secondary | ICD-10-CM | POA: Diagnosis not present

## 2015-02-04 DIAGNOSIS — I10 Essential (primary) hypertension: Secondary | ICD-10-CM | POA: Diagnosis not present

## 2015-02-04 DIAGNOSIS — Z992 Dependence on renal dialysis: Secondary | ICD-10-CM | POA: Diagnosis not present

## 2015-02-04 DIAGNOSIS — I120 Hypertensive chronic kidney disease with stage 5 chronic kidney disease or end stage renal disease: Secondary | ICD-10-CM | POA: Diagnosis not present

## 2015-02-04 DIAGNOSIS — M6281 Muscle weakness (generalized): Secondary | ICD-10-CM | POA: Diagnosis not present

## 2015-02-04 DIAGNOSIS — M4005 Postural kyphosis, thoracolumbar region: Secondary | ICD-10-CM | POA: Diagnosis not present

## 2015-02-04 DIAGNOSIS — N186 End stage renal disease: Secondary | ICD-10-CM | POA: Diagnosis not present

## 2015-02-04 DIAGNOSIS — R262 Difficulty in walking, not elsewhere classified: Secondary | ICD-10-CM | POA: Diagnosis not present

## 2015-02-05 DIAGNOSIS — D509 Iron deficiency anemia, unspecified: Secondary | ICD-10-CM | POA: Diagnosis not present

## 2015-02-05 DIAGNOSIS — N186 End stage renal disease: Secondary | ICD-10-CM | POA: Diagnosis not present

## 2015-02-06 DIAGNOSIS — M6281 Muscle weakness (generalized): Secondary | ICD-10-CM | POA: Diagnosis not present

## 2015-02-06 DIAGNOSIS — M4005 Postural kyphosis, thoracolumbar region: Secondary | ICD-10-CM | POA: Diagnosis not present

## 2015-02-06 DIAGNOSIS — I10 Essential (primary) hypertension: Secondary | ICD-10-CM | POA: Diagnosis not present

## 2015-02-06 DIAGNOSIS — R262 Difficulty in walking, not elsewhere classified: Secondary | ICD-10-CM | POA: Diagnosis not present

## 2015-02-06 DIAGNOSIS — N186 End stage renal disease: Secondary | ICD-10-CM | POA: Diagnosis not present

## 2015-02-07 DIAGNOSIS — N186 End stage renal disease: Secondary | ICD-10-CM | POA: Diagnosis not present

## 2015-02-07 DIAGNOSIS — D509 Iron deficiency anemia, unspecified: Secondary | ICD-10-CM | POA: Diagnosis not present

## 2015-02-08 DIAGNOSIS — R262 Difficulty in walking, not elsewhere classified: Secondary | ICD-10-CM | POA: Diagnosis not present

## 2015-02-08 DIAGNOSIS — N186 End stage renal disease: Secondary | ICD-10-CM | POA: Diagnosis not present

## 2015-02-08 DIAGNOSIS — I10 Essential (primary) hypertension: Secondary | ICD-10-CM | POA: Diagnosis not present

## 2015-02-08 DIAGNOSIS — M6281 Muscle weakness (generalized): Secondary | ICD-10-CM | POA: Diagnosis not present

## 2015-02-08 DIAGNOSIS — M4005 Postural kyphosis, thoracolumbar region: Secondary | ICD-10-CM | POA: Diagnosis not present

## 2015-02-10 DIAGNOSIS — D509 Iron deficiency anemia, unspecified: Secondary | ICD-10-CM | POA: Diagnosis not present

## 2015-02-10 DIAGNOSIS — N186 End stage renal disease: Secondary | ICD-10-CM | POA: Diagnosis not present

## 2015-02-11 DIAGNOSIS — N186 End stage renal disease: Secondary | ICD-10-CM | POA: Diagnosis not present

## 2015-02-11 DIAGNOSIS — R262 Difficulty in walking, not elsewhere classified: Secondary | ICD-10-CM | POA: Diagnosis not present

## 2015-02-11 DIAGNOSIS — M4005 Postural kyphosis, thoracolumbar region: Secondary | ICD-10-CM | POA: Diagnosis not present

## 2015-02-11 DIAGNOSIS — I10 Essential (primary) hypertension: Secondary | ICD-10-CM | POA: Diagnosis not present

## 2015-02-11 DIAGNOSIS — M6281 Muscle weakness (generalized): Secondary | ICD-10-CM | POA: Diagnosis not present

## 2015-02-12 DIAGNOSIS — D509 Iron deficiency anemia, unspecified: Secondary | ICD-10-CM | POA: Diagnosis not present

## 2015-02-12 DIAGNOSIS — N186 End stage renal disease: Secondary | ICD-10-CM | POA: Diagnosis not present

## 2015-02-13 ENCOUNTER — Encounter: Payer: Self-pay | Admitting: Internal Medicine

## 2015-02-13 ENCOUNTER — Ambulatory Visit (INDEPENDENT_AMBULATORY_CARE_PROVIDER_SITE_OTHER): Payer: Medicare Other | Admitting: Internal Medicine

## 2015-02-13 VITALS — BP 130/82 | HR 58 | Temp 97.9°F | Resp 20 | Ht 70.0 in | Wt 210.0 lb

## 2015-02-13 DIAGNOSIS — I150 Renovascular hypertension: Secondary | ICD-10-CM | POA: Diagnosis not present

## 2015-02-13 DIAGNOSIS — Z0000 Encounter for general adult medical examination without abnormal findings: Secondary | ICD-10-CM | POA: Diagnosis not present

## 2015-02-13 DIAGNOSIS — M5136 Other intervertebral disc degeneration, lumbar region: Secondary | ICD-10-CM

## 2015-02-13 DIAGNOSIS — N186 End stage renal disease: Secondary | ICD-10-CM | POA: Diagnosis not present

## 2015-02-13 DIAGNOSIS — G3184 Mild cognitive impairment, so stated: Secondary | ICD-10-CM

## 2015-02-13 DIAGNOSIS — G40909 Epilepsy, unspecified, not intractable, without status epilepticus: Secondary | ICD-10-CM

## 2015-02-13 DIAGNOSIS — Z992 Dependence on renal dialysis: Secondary | ICD-10-CM | POA: Diagnosis not present

## 2015-02-13 DIAGNOSIS — E119 Type 2 diabetes mellitus without complications: Secondary | ICD-10-CM | POA: Diagnosis not present

## 2015-02-13 DIAGNOSIS — N189 Chronic kidney disease, unspecified: Secondary | ICD-10-CM | POA: Diagnosis not present

## 2015-02-13 DIAGNOSIS — D631 Anemia in chronic kidney disease: Secondary | ICD-10-CM | POA: Diagnosis not present

## 2015-02-13 DIAGNOSIS — N2581 Secondary hyperparathyroidism of renal origin: Secondary | ICD-10-CM | POA: Diagnosis not present

## 2015-02-13 DIAGNOSIS — M6281 Muscle weakness (generalized): Secondary | ICD-10-CM | POA: Diagnosis not present

## 2015-02-13 DIAGNOSIS — M4005 Postural kyphosis, thoracolumbar region: Secondary | ICD-10-CM | POA: Diagnosis not present

## 2015-02-13 DIAGNOSIS — R262 Difficulty in walking, not elsewhere classified: Secondary | ICD-10-CM | POA: Diagnosis not present

## 2015-02-13 DIAGNOSIS — I10 Essential (primary) hypertension: Secondary | ICD-10-CM | POA: Diagnosis not present

## 2015-02-13 NOTE — Patient Instructions (Addendum)
Try using the heat on your back as you have before HD.  After you take the heat off, try theragesic cream on your back.  It might keep you more comfortable during the session.  If you need me between visits, please call us so we can get you in to be seen sooner.  You cannot see two primary care physicians at the same time.  We need your records from Dr. Parke Simmers so I know what's going on with your medications b/c they have changed.

## 2015-02-13 NOTE — Progress Notes (Signed)
Patient ID: [name], male   DOB: Jun 22, 1932, 81 y.o.   MRN: [id number]   Location:  [facility]  Goals of Care: Advanced Directive information Does patient have an advance directive?: Yes, Type of Advance Directive: Healthcare Power of [place];Living will   Chief Complaint  Patient presents with  . Annual Exam    Annual exam    HPI: Patient is a 81 y.o. black male seen in the office today for his annual exam.  York Spaniel he was walking fine until he was hospitalized for a seizure, then hospitalized a second time and went to a nursing home.  Had some memory loss from that point forward.  Doing physical therapy for his back Tues and Thurs.  It's going fine.  Still not able to stand as long as they want him to.  When stood too long, he was walking like a drunk man afterwards.  Therapy is helping a lot.  Has walked farther today than he ever has since his back hurt him.  Used to have the nerve pain down his legs.  Gabapentin has helped this.  Rarely uses the flexeril.  He just had his third session of therapy.  Got ordered through the social worker at HD.    Has HD m/w/f.  Had been having severe cramping there, but now has not had it in a week.  Had a change in the dialysate a bit.  DMII:  hba1c has been good since on different injection--not sure what this means.  Says he's on humalog instead of novolog b/c of change in insurance coverage.  Dr. Parke Simmers changed the insulin.    Has used heat on his back before going to HD.  He has not used theragesic cream.    He is not depressed.  He denies any falls in the past year.   MMSE - Mini Mental State Exam 10/29/2014 04/22/2014 10/17/2013  Orientation to time 5 4 2   Orientation to Place 5 5 3   Registration 3 3 3   Attention/ Calculation 4 3 1   Recall 3 2 1   Language- name 2 objects 2 2 2   Language- repeat 1 1 1   Language- follow 3 step command 2 3 3   Language- read & follow direction 1 1 1   Write a  sentence 1 1 1   Copy design 0 1 1  Total score 27 26 19     Review of Systems:  Review of Systems  Constitutional: Negative for fever, chills and malaise/fatigue.  HENT: Positive for congestion and hearing loss.        In sinuses; neurologic hearing loss in left ear  Eyes: Negative for blurred vision.  Respiratory: Negative for shortness of breath.   Cardiovascular: Positive for leg swelling. Negative for chest pain and palpitations.  Gastrointestinal: Negative for abdominal pain, constipation, blood in stool and melena.  Genitourinary: Negative for dysuria.  Musculoskeletal: Positive for back pain. Negative for myalgias and falls.  Skin: Negative for rash.  Neurological: Negative for dizziness, loss of consciousness and weakness.  Endo/Heme/Allergies: Does not bruise/bleed easily.  Psychiatric/Behavioral: Positive for memory loss. Negative for depression.    Past Medical History  Diagnosis Date  . Hypertension   . Diabetes mellitus without complication   . Arthritis   . History of blood transfusion   . GI bleed   . ESRD (end stage renal disease)   . Seizure   . Dialysis patient     Past Surgical History  Procedure Laterality  Date  . Esophagogastroduodenoscopy  10/03/2012    Procedure: ESOPHAGOGASTRODUODENOSCOPY (EGD);  Surgeon: Theda Belfast, MD;  Location: [facility] ENDOSCOPY;  Service: Endoscopy;  Laterality: N/A;  . Colonoscopy  10/04/2012    Procedure: COLONOSCOPY;  Surgeon: Theda Belfast, MD;  Location: [facility] ENDOSCOPY;  Service: Endoscopy;  Laterality: N/A;  . Colonoscopy with esophagogastroduodenoscopy (egd) Left 11/30/2012    Procedure: COLONOSCOPY WITH ESOPHAGOGASTRODUODENOSCOPY (EGD);  Surgeon: Theda Belfast, MD;  Location: [facility] ENDOSCOPY;  Service: Endoscopy;  Laterality: Left;  . Spine surgery  2004    back fusion/ [facility] Penumalli,MD    Allergies  Allergen Reactions  . Aspirin Other (See Comments)    Bleeding   . Lactose Intolerance (Gi) Diarrhea    Medications: Patient's Medications  New Prescriptions   No medications on file  Previous Medications   ACCU-CHEK AVIVA PLUS TEST STRIP       AMLODIPINE (NORVASC) 10 MG TABLET    Take 10 mg by mouth Daily.    CALCIUM ACETATE (PHOSLO) 667 MG CAPSULE    Take 667 mg by mouth 3 (three) times daily.   COLCHICINE 0.6 MG TABLET       CYCLOBENZAPRINE (FLEXERIL) 5 MG TABLET    Take 5 mg by mouth daily as needed.   DARBEPOETIN (ARANESP) 60 MCG/0.3ML SOLN INJECTION    Inject 0.3 mLs (60 mcg total) into the vein every Thursday with hemodialysis.   DOXERCALCIFEROL (HECTOROL) 4 MCG/2ML INJECTION    Inject 2 mLs (4 mcg total) into the vein Every Tuesday,Thursday,and Saturday with dialysis.   FOLIC ACID (FOLVITE) 1 MG TABLET    Take 1 tablet (1 mg total) by mouth daily.   GABAPENTIN (NEURONTIN) 100 MG CAPSULE    Take 1 capsule (100 mg total) by mouth 3 (three) times daily.   INSULIN ASPART (NOVOLOG) 100 UNIT/ML INJECTION    0-9 Units, Subcutaneous, 3 times daily with meals CBG < 70: implement hypoglycemia protocol CBG 70 - 120: 0 units CBG 121 - 150: 1 unit CBG 151 - 200: 2 units CBG 201 - 250: 3 units CBG 251 - 300: 5 units CBG 301 - 350: 7 units CBG 351 - 400: 9 units CBG > 400: call MD   LACOSAMIDE (VIMPAT) 50 MG TABS TABLET    Take 1 tablet (50 mg total) by mouth 2 (two) times daily.   MULTIVITAMIN (RENA-VIT) TABS TABLET    Take 1 tablet by mouth at bedtime.   SODIUM CHLORIDE 0.9 % SOLN 100 ML WITH FERRIC GLUCONATE 12.5 MG/ML SOLN 62.5 MG    Inject 62.5 mg into the vein every Thursday with hemodialysis.  Modified Medications   No medications on file  Discontinued Medications   No medications on file    Physical Exam: Filed Vitals:   02/13/15 1341  BP: 140/78  Pulse: 58  Temp: 97.9 F (36.6 C)  TempSrc: Oral  Resp: 20  Height: 5\' 10"  (1.778 m)  Weight: 210 lb (95.255 kg)  SpO2: 96%   Physical Exam  Constitutional: He is oriented to person, place, and time. He appears  well-developed and well-nourished. No distress.  HENT:  Head: Normocephalic and atraumatic.  Right Ear: External ear normal.  Left Ear: External ear normal.  Nose: Nose normal.  Mouth/Throat: Oropharynx is clear and moist.  Loss of hearing on left  Eyes: Conjunctivae and EOM are normal. Pupils are equal, round, and reactive to light.  Wears glasses  Neck: Normal range of motion. Neck supple. No JVD present. No tracheal deviation  present. No thyromegaly present.  Cardiovascular: Normal rate, regular rhythm, normal heart sounds and intact distal pulses.   Right upper arm AV fistula with normal pulse and thrill  Pulmonary/Chest: Effort normal and breath sounds normal. No respiratory distress.  Abdominal: Soft. Bowel sounds are normal. He exhibits no distension and no mass. There is no tenderness.  Musculoskeletal: He exhibits tenderness.  Over left greater trochanter  Neurological: He is alert and oriented to person, place, and time. He displays normal reflexes. He exhibits normal muscle tone.  But short term memory loss present; last mmse in Dec  Skin: Skin is warm and dry.  Psychiatric: He has a normal mood and affect.  Tells the same story he told me when he first came; does have difficulty keeping chronology straight and finding words, sometimes uses incorrect words    Labs reviewed: Basic Metabolic Panel:  Recent Labs  49/82/64 1059  NA 142  K 5.0  CL 97  CO2 28  GLUCOSE 134*  BUN 53*  CREATININE 6.86*  CALCIUM 9.7   Liver Function Tests:  Recent Labs  08/15/14 1059  AST 24  ALT 15  ALKPHOS 56  BILITOT 0.4  PROT 7.1   No results for input(s): LIPASE, AMYLASE in the last 8760 hours. No results for input(s): AMMONIA in the last 8760 hours. CBC:  Recent Labs  08/15/14 1059  WBC 3.4  NEUTROABS 1.9  HGB 9.9*  HCT 29.8*  MCV 93  PLT 94*   Lipid Panel: No results for input(s): CHOL, HDL, LDLCALC, TRIG, CHOLHDL, LDLDIRECT in the last 8760 hours. Lab Results   Component Value Date   HGBA1C 6.1* 08/15/2014   Assessment/Plan 1. Degenerative disc disease, lumbar -has been going to physical therapy he tells me -it is unclear where the order came from (he says dialysis center) -cont gabapentin 100mg  po tid for this which has helped -cont use of heat before HD -then recommended he apply theragesic to attend HD so he can make it through the session more comfortably--written instructions provided due to memory loss  2. Diabetes type 2, controlled -as of dec 2015, this was controlled -recheck before his next appt in 3 mos -says his insulin was changed--seems like it was changed from novolog to humalog, but this is unclear b/c he said his meds weren't changed when he came in but he and his wife both have significant memory loss  3. ESRD on dialysis -attends M/W/F -had several concerns about this:  Thinks they have taken off too much fluid and he's had a lot of cramps (though better this last week), also very uncomfortable sitting for 4 hours (advice given above)  4. Anemia in chronic kidney disease -continues on treatment for this through nephrology--med list shows aranesp--unsure when he needed this last; cont iron also  5. Seizure disorder -cont vimpat for this--he says he has two seizure foci he was told about  6. Renovascular hypertension -bp improved on recheck today to 130/82  7. Secondary hyperparathyroidism, renal -contines on hectoral per med list but this might be wrong   8.  General medical exam:   -see above hpi -I STILL do not have records from his prior PCP probably b/c they've been back there since he came here for some reason -I don't know which vaccines he's had and not had and don't really know what his med list is -had cscope in 2014, foot exam today, eye exam last sept; no falls since last appt, not depressed, MMSE scores as  above  9.  Mild cognitive impairment:  Based on MMSE, seems fairly good functionally, but some  difficulty with judgment and word finding -letter printed for [facility] as he and his wife both have significant memory impairment  Called their son [name] to discuss my concerns about their memory loss. Left a message to return my call today or tomorrow morning here at the office.  Labs/tests ordered:   Orders Placed This Encounter  Procedures  . CBC with Differential/Platelet    Standing Status: Future     Number of Occurrences:      Standing Expiration Date: 08/15/2015  . Hemoglobin A1c    Standing Status: Future     Number of Occurrences:      Standing Expiration Date: 08/15/2015  . Lipid panel    Standing Status: Future     Number of Occurrences:      Standing Expiration Date: 08/15/2015    Order Specific Question:  Has the patient fasted?    Answer:  Yes  . Basic metabolic panel    Standing Status: Future     Number of Occurrences:      Standing Expiration Date: 08/15/2015    Order Specific Question:  Has the patient fasted?    Answer:  Yes    Next appt:  3 mos with labs before fasting  Chanan Detwiler L. Kameren Pargas, D.O. Geriatrics [facility] [address], [place] [address] Cell Phone (Mon-Fri 8am-5pm):  [phone] On Call:  [phone] & follow prompts after 5pm & weekends Office Phone:  [phone] Office Fax:  [phone]

## 2015-02-14 ENCOUNTER — Telehealth: Payer: Self-pay

## 2015-02-14 DIAGNOSIS — N186 End stage renal disease: Secondary | ICD-10-CM | POA: Diagnosis not present

## 2015-02-14 DIAGNOSIS — D509 Iron deficiency anemia, unspecified: Secondary | ICD-10-CM | POA: Diagnosis not present

## 2015-02-14 NOTE — Telephone Encounter (Signed)
Son came by office with medications to up date list

## 2015-02-15 DIAGNOSIS — R262 Difficulty in walking, not elsewhere classified: Secondary | ICD-10-CM | POA: Diagnosis not present

## 2015-02-15 DIAGNOSIS — M4005 Postural kyphosis, thoracolumbar region: Secondary | ICD-10-CM | POA: Diagnosis not present

## 2015-02-15 DIAGNOSIS — I10 Essential (primary) hypertension: Secondary | ICD-10-CM | POA: Diagnosis not present

## 2015-02-15 DIAGNOSIS — M6281 Muscle weakness (generalized): Secondary | ICD-10-CM | POA: Diagnosis not present

## 2015-02-15 DIAGNOSIS — N186 End stage renal disease: Secondary | ICD-10-CM | POA: Diagnosis not present

## 2015-02-17 DIAGNOSIS — N186 End stage renal disease: Secondary | ICD-10-CM | POA: Diagnosis not present

## 2015-02-17 DIAGNOSIS — D509 Iron deficiency anemia, unspecified: Secondary | ICD-10-CM | POA: Diagnosis not present

## 2015-02-17 DIAGNOSIS — G4733 Obstructive sleep apnea (adult) (pediatric): Secondary | ICD-10-CM | POA: Diagnosis not present

## 2015-02-18 DIAGNOSIS — R262 Difficulty in walking, not elsewhere classified: Secondary | ICD-10-CM | POA: Diagnosis not present

## 2015-02-18 DIAGNOSIS — N186 End stage renal disease: Secondary | ICD-10-CM | POA: Diagnosis not present

## 2015-02-18 DIAGNOSIS — I10 Essential (primary) hypertension: Secondary | ICD-10-CM | POA: Diagnosis not present

## 2015-02-18 DIAGNOSIS — M6281 Muscle weakness (generalized): Secondary | ICD-10-CM | POA: Diagnosis not present

## 2015-02-18 DIAGNOSIS — M4005 Postural kyphosis, thoracolumbar region: Secondary | ICD-10-CM | POA: Diagnosis not present

## 2015-02-19 DIAGNOSIS — D509 Iron deficiency anemia, unspecified: Secondary | ICD-10-CM | POA: Diagnosis not present

## 2015-02-19 DIAGNOSIS — N186 End stage renal disease: Secondary | ICD-10-CM | POA: Diagnosis not present

## 2015-02-21 DIAGNOSIS — D509 Iron deficiency anemia, unspecified: Secondary | ICD-10-CM | POA: Diagnosis not present

## 2015-02-21 DIAGNOSIS — N186 End stage renal disease: Secondary | ICD-10-CM | POA: Diagnosis not present

## 2015-02-24 DIAGNOSIS — N186 End stage renal disease: Secondary | ICD-10-CM | POA: Diagnosis not present

## 2015-02-24 DIAGNOSIS — D509 Iron deficiency anemia, unspecified: Secondary | ICD-10-CM | POA: Diagnosis not present

## 2015-02-26 DIAGNOSIS — N186 End stage renal disease: Secondary | ICD-10-CM | POA: Diagnosis not present

## 2015-02-26 DIAGNOSIS — D509 Iron deficiency anemia, unspecified: Secondary | ICD-10-CM | POA: Diagnosis not present

## 2015-02-27 DIAGNOSIS — N186 End stage renal disease: Secondary | ICD-10-CM | POA: Diagnosis not present

## 2015-02-27 DIAGNOSIS — T82858D Stenosis of vascular prosthetic devices, implants and grafts, subsequent encounter: Secondary | ICD-10-CM | POA: Diagnosis not present

## 2015-02-27 DIAGNOSIS — Z992 Dependence on renal dialysis: Secondary | ICD-10-CM | POA: Diagnosis not present

## 2015-02-27 DIAGNOSIS — I871 Compression of vein: Secondary | ICD-10-CM | POA: Diagnosis not present

## 2015-02-28 DIAGNOSIS — N186 End stage renal disease: Secondary | ICD-10-CM | POA: Diagnosis not present

## 2015-02-28 DIAGNOSIS — D509 Iron deficiency anemia, unspecified: Secondary | ICD-10-CM | POA: Diagnosis not present

## 2015-03-03 DIAGNOSIS — D509 Iron deficiency anemia, unspecified: Secondary | ICD-10-CM | POA: Diagnosis not present

## 2015-03-03 DIAGNOSIS — N186 End stage renal disease: Secondary | ICD-10-CM | POA: Diagnosis not present

## 2015-03-05 DIAGNOSIS — D509 Iron deficiency anemia, unspecified: Secondary | ICD-10-CM | POA: Diagnosis not present

## 2015-03-05 DIAGNOSIS — N186 End stage renal disease: Secondary | ICD-10-CM | POA: Diagnosis not present

## 2015-03-06 DIAGNOSIS — Z992 Dependence on renal dialysis: Secondary | ICD-10-CM | POA: Diagnosis not present

## 2015-03-06 DIAGNOSIS — I120 Hypertensive chronic kidney disease with stage 5 chronic kidney disease or end stage renal disease: Secondary | ICD-10-CM | POA: Diagnosis not present

## 2015-03-06 DIAGNOSIS — N186 End stage renal disease: Secondary | ICD-10-CM | POA: Diagnosis not present

## 2015-03-07 DIAGNOSIS — N186 End stage renal disease: Secondary | ICD-10-CM | POA: Diagnosis not present

## 2015-03-07 DIAGNOSIS — D509 Iron deficiency anemia, unspecified: Secondary | ICD-10-CM | POA: Diagnosis not present

## 2015-03-07 DIAGNOSIS — E1129 Type 2 diabetes mellitus with other diabetic kidney complication: Secondary | ICD-10-CM | POA: Diagnosis not present

## 2015-03-07 DIAGNOSIS — D631 Anemia in chronic kidney disease: Secondary | ICD-10-CM | POA: Diagnosis not present

## 2015-03-10 DIAGNOSIS — D631 Anemia in chronic kidney disease: Secondary | ICD-10-CM | POA: Diagnosis not present

## 2015-03-10 DIAGNOSIS — E1129 Type 2 diabetes mellitus with other diabetic kidney complication: Secondary | ICD-10-CM | POA: Diagnosis not present

## 2015-03-10 DIAGNOSIS — D509 Iron deficiency anemia, unspecified: Secondary | ICD-10-CM | POA: Diagnosis not present

## 2015-03-10 DIAGNOSIS — N186 End stage renal disease: Secondary | ICD-10-CM | POA: Diagnosis not present

## 2015-03-12 DIAGNOSIS — D509 Iron deficiency anemia, unspecified: Secondary | ICD-10-CM | POA: Diagnosis not present

## 2015-03-12 DIAGNOSIS — E1129 Type 2 diabetes mellitus with other diabetic kidney complication: Secondary | ICD-10-CM | POA: Diagnosis not present

## 2015-03-12 DIAGNOSIS — N186 End stage renal disease: Secondary | ICD-10-CM | POA: Diagnosis not present

## 2015-03-12 DIAGNOSIS — D631 Anemia in chronic kidney disease: Secondary | ICD-10-CM | POA: Diagnosis not present

## 2015-03-14 DIAGNOSIS — N186 End stage renal disease: Secondary | ICD-10-CM | POA: Diagnosis not present

## 2015-03-14 DIAGNOSIS — E1129 Type 2 diabetes mellitus with other diabetic kidney complication: Secondary | ICD-10-CM | POA: Diagnosis not present

## 2015-03-14 DIAGNOSIS — D509 Iron deficiency anemia, unspecified: Secondary | ICD-10-CM | POA: Diagnosis not present

## 2015-03-14 DIAGNOSIS — D631 Anemia in chronic kidney disease: Secondary | ICD-10-CM | POA: Diagnosis not present

## 2015-03-17 DIAGNOSIS — E1129 Type 2 diabetes mellitus with other diabetic kidney complication: Secondary | ICD-10-CM | POA: Diagnosis not present

## 2015-03-17 DIAGNOSIS — D631 Anemia in chronic kidney disease: Secondary | ICD-10-CM | POA: Diagnosis not present

## 2015-03-17 DIAGNOSIS — D509 Iron deficiency anemia, unspecified: Secondary | ICD-10-CM | POA: Diagnosis not present

## 2015-03-17 DIAGNOSIS — N186 End stage renal disease: Secondary | ICD-10-CM | POA: Diagnosis not present

## 2015-03-19 DIAGNOSIS — D631 Anemia in chronic kidney disease: Secondary | ICD-10-CM | POA: Diagnosis not present

## 2015-03-19 DIAGNOSIS — E1129 Type 2 diabetes mellitus with other diabetic kidney complication: Secondary | ICD-10-CM | POA: Diagnosis not present

## 2015-03-19 DIAGNOSIS — N186 End stage renal disease: Secondary | ICD-10-CM | POA: Diagnosis not present

## 2015-03-19 DIAGNOSIS — D509 Iron deficiency anemia, unspecified: Secondary | ICD-10-CM | POA: Diagnosis not present

## 2015-03-21 DIAGNOSIS — N186 End stage renal disease: Secondary | ICD-10-CM | POA: Diagnosis not present

## 2015-03-21 DIAGNOSIS — D509 Iron deficiency anemia, unspecified: Secondary | ICD-10-CM | POA: Diagnosis not present

## 2015-03-21 DIAGNOSIS — D631 Anemia in chronic kidney disease: Secondary | ICD-10-CM | POA: Diagnosis not present

## 2015-03-21 DIAGNOSIS — E1129 Type 2 diabetes mellitus with other diabetic kidney complication: Secondary | ICD-10-CM | POA: Diagnosis not present

## 2015-03-24 DIAGNOSIS — E1129 Type 2 diabetes mellitus with other diabetic kidney complication: Secondary | ICD-10-CM | POA: Diagnosis not present

## 2015-03-24 DIAGNOSIS — D631 Anemia in chronic kidney disease: Secondary | ICD-10-CM | POA: Diagnosis not present

## 2015-03-24 DIAGNOSIS — D509 Iron deficiency anemia, unspecified: Secondary | ICD-10-CM | POA: Diagnosis not present

## 2015-03-24 DIAGNOSIS — N186 End stage renal disease: Secondary | ICD-10-CM | POA: Diagnosis not present

## 2015-03-26 DIAGNOSIS — N186 End stage renal disease: Secondary | ICD-10-CM | POA: Diagnosis not present

## 2015-03-26 DIAGNOSIS — D509 Iron deficiency anemia, unspecified: Secondary | ICD-10-CM | POA: Diagnosis not present

## 2015-03-26 DIAGNOSIS — E1129 Type 2 diabetes mellitus with other diabetic kidney complication: Secondary | ICD-10-CM | POA: Diagnosis not present

## 2015-03-26 DIAGNOSIS — D631 Anemia in chronic kidney disease: Secondary | ICD-10-CM | POA: Diagnosis not present

## 2015-03-28 DIAGNOSIS — E1129 Type 2 diabetes mellitus with other diabetic kidney complication: Secondary | ICD-10-CM | POA: Diagnosis not present

## 2015-03-28 DIAGNOSIS — D631 Anemia in chronic kidney disease: Secondary | ICD-10-CM | POA: Diagnosis not present

## 2015-03-28 DIAGNOSIS — N186 End stage renal disease: Secondary | ICD-10-CM | POA: Diagnosis not present

## 2015-03-28 DIAGNOSIS — D509 Iron deficiency anemia, unspecified: Secondary | ICD-10-CM | POA: Diagnosis not present

## 2015-03-31 DIAGNOSIS — D509 Iron deficiency anemia, unspecified: Secondary | ICD-10-CM | POA: Diagnosis not present

## 2015-03-31 DIAGNOSIS — N186 End stage renal disease: Secondary | ICD-10-CM | POA: Diagnosis not present

## 2015-03-31 DIAGNOSIS — D631 Anemia in chronic kidney disease: Secondary | ICD-10-CM | POA: Diagnosis not present

## 2015-03-31 DIAGNOSIS — E1129 Type 2 diabetes mellitus with other diabetic kidney complication: Secondary | ICD-10-CM | POA: Diagnosis not present

## 2015-04-02 DIAGNOSIS — D509 Iron deficiency anemia, unspecified: Secondary | ICD-10-CM | POA: Diagnosis not present

## 2015-04-02 DIAGNOSIS — D631 Anemia in chronic kidney disease: Secondary | ICD-10-CM | POA: Diagnosis not present

## 2015-04-02 DIAGNOSIS — N186 End stage renal disease: Secondary | ICD-10-CM | POA: Diagnosis not present

## 2015-04-02 DIAGNOSIS — E1129 Type 2 diabetes mellitus with other diabetic kidney complication: Secondary | ICD-10-CM | POA: Diagnosis not present

## 2015-04-04 DIAGNOSIS — D509 Iron deficiency anemia, unspecified: Secondary | ICD-10-CM | POA: Diagnosis not present

## 2015-04-04 DIAGNOSIS — E1129 Type 2 diabetes mellitus with other diabetic kidney complication: Secondary | ICD-10-CM | POA: Diagnosis not present

## 2015-04-04 DIAGNOSIS — N186 End stage renal disease: Secondary | ICD-10-CM | POA: Diagnosis not present

## 2015-04-04 DIAGNOSIS — D631 Anemia in chronic kidney disease: Secondary | ICD-10-CM | POA: Diagnosis not present

## 2015-04-06 DIAGNOSIS — I120 Hypertensive chronic kidney disease with stage 5 chronic kidney disease or end stage renal disease: Secondary | ICD-10-CM | POA: Diagnosis not present

## 2015-04-06 DIAGNOSIS — N186 End stage renal disease: Secondary | ICD-10-CM | POA: Diagnosis not present

## 2015-04-06 DIAGNOSIS — Z992 Dependence on renal dialysis: Secondary | ICD-10-CM | POA: Diagnosis not present

## 2015-04-07 ENCOUNTER — Other Ambulatory Visit: Payer: Self-pay | Admitting: *Deleted

## 2015-04-07 DIAGNOSIS — D509 Iron deficiency anemia, unspecified: Secondary | ICD-10-CM | POA: Diagnosis not present

## 2015-04-07 DIAGNOSIS — N186 End stage renal disease: Secondary | ICD-10-CM | POA: Diagnosis not present

## 2015-04-07 DIAGNOSIS — M5136 Other intervertebral disc degeneration, lumbar region: Secondary | ICD-10-CM

## 2015-04-07 DIAGNOSIS — D631 Anemia in chronic kidney disease: Secondary | ICD-10-CM | POA: Diagnosis not present

## 2015-04-07 HISTORY — PX: RETINAL LASER PROCEDURE: SHX2339

## 2015-04-07 MED ORDER — GABAPENTIN 100 MG PO CAPS: 100.0000 mg | ORAL_CAPSULE | Freq: Three times a day (TID) | ORAL | Status: DC

## 2015-04-07 NOTE — Telephone Encounter (Signed)
[facility] [address] 

## 2015-04-09 DIAGNOSIS — N186 End stage renal disease: Secondary | ICD-10-CM | POA: Diagnosis not present

## 2015-04-09 DIAGNOSIS — D631 Anemia in chronic kidney disease: Secondary | ICD-10-CM | POA: Diagnosis not present

## 2015-04-09 DIAGNOSIS — D509 Iron deficiency anemia, unspecified: Secondary | ICD-10-CM | POA: Diagnosis not present

## 2015-04-11 DIAGNOSIS — D631 Anemia in chronic kidney disease: Secondary | ICD-10-CM | POA: Diagnosis not present

## 2015-04-11 DIAGNOSIS — D509 Iron deficiency anemia, unspecified: Secondary | ICD-10-CM | POA: Diagnosis not present

## 2015-04-11 DIAGNOSIS — N186 End stage renal disease: Secondary | ICD-10-CM | POA: Diagnosis not present

## 2015-04-14 DIAGNOSIS — D631 Anemia in chronic kidney disease: Secondary | ICD-10-CM | POA: Diagnosis not present

## 2015-04-14 DIAGNOSIS — D509 Iron deficiency anemia, unspecified: Secondary | ICD-10-CM | POA: Diagnosis not present

## 2015-04-14 DIAGNOSIS — N186 End stage renal disease: Secondary | ICD-10-CM | POA: Diagnosis not present

## 2015-04-16 DIAGNOSIS — D509 Iron deficiency anemia, unspecified: Secondary | ICD-10-CM | POA: Diagnosis not present

## 2015-04-16 DIAGNOSIS — D631 Anemia in chronic kidney disease: Secondary | ICD-10-CM | POA: Diagnosis not present

## 2015-04-16 DIAGNOSIS — N186 End stage renal disease: Secondary | ICD-10-CM | POA: Diagnosis not present

## 2015-04-17 ENCOUNTER — Encounter: Payer: Self-pay | Admitting: Diagnostic Neuroimaging

## 2015-04-17 ENCOUNTER — Ambulatory Visit (INDEPENDENT_AMBULATORY_CARE_PROVIDER_SITE_OTHER): Payer: Medicare Other | Admitting: Diagnostic Neuroimaging

## 2015-04-17 DIAGNOSIS — G40909 Epilepsy, unspecified, not intractable, without status epilepticus: Secondary | ICD-10-CM

## 2015-04-17 MED ORDER — LACOSAMIDE 50 MG PO TABS: 50.0000 mg | ORAL_TABLET | Freq: Two times a day (BID) | ORAL | Status: DC

## 2015-04-17 NOTE — Progress Notes (Signed)
PATIENT: [name] DOB: 1932-03-21  REASON FOR VISIT: routine follow up for seizure disorder, memory HISTORY FROM: patient and wife   Chief Complaint  Patient presents with  . Follow-up    sz     HISTORY OF PRESENT ILLNESS:  UPDATE 04/17/15: Since last visit, no seizures. Overall stable. Other day at dialysis had diff bleeding, so had to stay in chair and extra hour. Now having more back pain. Using traction device at home.   UPDATE 10/29/14 (VRP): Patient is doing well. No seizures. Memory loss has improved. He continues on hemodialysis and is doing well.  UPDATE 04/22/14 (LL):  Since last visit, patient is much improved. No seizures, left rehab and living again at home with his wife.  Mood much better. Cognition better. Reportedly taking less fluid off at hemodialysis. Tolerating vimpat without side effects.  UPDATE 10/17/13: Since last visit, was admitted 09/25/13 - 09/29/13 for altered mental status,elevated VPA level (107.5). VPA was d/c's and vimpat 100mg  BID was started. Also was admitted 10/01/13 - 10/09/13 for metabolic encephalopathy. This time vimpat was decreased to 50mg  BID. No definite witnessed seizures. Now in rehab, trying to improve strength before returning home.   PRIOR HPI (02/09/13): 81 y.o. male with ESRD, on dialysis x 3 weeks, diabetes, hypertension, recent history of GI bleeding from arteriovenous malformations. On 01/12/13 was in the bathroom at home he sustained a syncopal event. His wife was downstairs, she heard him fall and ran upstairs, she found him on the bathroom floor wedged between the wall and the commode. He was apparently awake but lethargic and somewhat confused. She then called EMS, upon EMS arrival patient started slowly coming around, he was then brought to the emergency room for further evaluation. While in the emergency room patient completely regained his usual mentation. CT of the head and C-spine was found to be negative, patient sustained a small  laceration above his left eye. Son reports that patient had second witnessed seizure in the hospital, where he had convulsions and LOC. He was incontinent of urine without tongue-biting. The episode lasted a few minutes. Per the history obtained, patient has been in his usual state of health over the past few days to weeks. He does have some unsteady gait, but has not been excessively lethargic nor has he had any issues with nausea vomiting.Patient does not have a history of seizures in the past. He had not been on hemodialysis prior to hospitalization although he has had several grafts placed in anticipation of starting HD. Patient now gets HD Tues, Thurs, Sat. Physical therapy on Monday, Wed. Friday.    REVIEW OF SYSTEMS: Full 14 system review of systems performed and notable only for constipation diarrhea restless legs apnea snoring moles neck stiffness muscle cramps walking difficulty back pain joint pain anemia.  ALLERGIES: Allergies  Allergen Reactions  . Aspirin Other (See Comments)    Bleeding   . Lactose Intolerance (Gi) Diarrhea    HOME MEDICATIONS: Outpatient Prescriptions Prior to Visit  Medication Sig Dispense Refill  . ACCU-CHEK AVIVA PLUS test strip     . acetaminophen (TYLENOL) 500 MG tablet Take 500 mg by mouth. Take as needed    . amLODipine (NORVASC) 10 MG tablet Take 10 mg by mouth Daily.     . calcium acetate (PHOSLO) 667 MG capsule Take 667 mg by mouth 3 (three) times daily.  0  . darbepoetin (ARANESP) 60 MCG/0.3ML SOLN injection Inject 0.3 mLs (60 mcg total) into the vein every  Thursday with hemodialysis. (Patient taking differently: Inject 60 mcg into the vein every 7 (seven) days. ) 4.2 mL   . doxercalciferol (HECTOROL) 4 MCG/2ML injection Inject 2 mLs (4 mcg total) into the vein Every Tuesday,Thursday,and Saturday with dialysis. (Patient taking differently: Inject 4 mcg into the vein every Monday, Wednesday, and Friday with hemodialysis. ) 2 mL   . fexofenadine  (ALLEGRA) 180 MG tablet Take 180 mg by mouth daily. As needed    . folic acid (FOLVITE) 1 MG tablet Take 1 tablet (1 mg total) by mouth daily. 30 tablet 0  . gabapentin (NEURONTIN) 100 MG capsule Take 1 capsule (100 mg total) by mouth 3 (three) times daily. 90 capsule 3  . insulin lispro (HUMALOG) 100 UNIT/ML cartridge Inject into the skin. Sliding scale    . multivitamin (RENA-VIT) TABS tablet Take 1 tablet by mouth at bedtime. 30 tablet 0  . senna-docusate (SENOKOT-S) 8.6-50 MG per tablet Take 1 tablet by mouth. As needed    . sodium chloride 0.9 % SOLN 100 mL with ferric gluconate 12.5 MG/ML SOLN 62.5 mg Inject 62.5 mg into the vein every Thursday with hemodialysis.    Marland Kitchen triamcinolone (NASACORT ALLERGY 24HR) 55 MCG/ACT AERO nasal inhaler Place 1 spray into the nose. One spray nasal as needed for allery    . lacosamide (VIMPAT) 50 MG TABS tablet Take 1 tablet (50 mg total) by mouth 2 (two) times daily. 180 tablet 4  . colchicine 0.6 MG tablet as needed.     . cyclobenzaprine (FLEXERIL) 5 MG tablet Take 5 mg by mouth daily as needed.  0   No facility-administered medications prior to visit.    PHYSICAL EXAM Filed Vitals:   04/17/15 1527  BP: 176/70  Pulse: 78  Height:  (1.778 m)  Weight: 207 lb 9.6 oz (94.167 kg)   Body mass index is 29.79 kg/(m^2).  MMSE - Mini Mental State Exam 10/29/2014 04/22/2014 10/17/2013  Orientation to time Orientation to Place Registration Attention/ Calculation Recall Language- name 2 objects Language- repeat Language- follow 3 step command Language- read & follow direction Write a sentence Copy design 0 1 1  Total score Generalized: Well developed, pleasant elderly male in no acute distress  Neck: Supple, no carotid bruits  Cardiac: Regular rate rhythm, BLOWING HOLOSYSTOLIC MURMUR, OVER RIGHT PRECORDIAL REGION; RIGHT ANTEBRACHIAL AV FISTULA WITH PALPABLE  THRILL  NEUROLOGIC:  MENTAL STATUS: awake, alert, language fluent, comprehension intact, naming intact; MMSE 27/30 CRANIAL NERVE: pupils equal and reactive to light, visual fields full to confrontation, extraocular muscles intact, no nystagmus, facial sensation and strength symmetric, uvula midline, shoulder shrug symmetric, tongue midline.  MOTOR: normal bulk and tone, full strength in the BUE, BLE  SENSORY: ABSENT VIBRATION IN LOWER EXT  COORDINATION: finger-nose-finger, fine finger movements normal  REFLEXES: deep tendon reflexes present and symmetric, ABSENT BILAT KNEE AND ANKLE  GAIT/STATION: narrow based gait; UNSTEADY ON RISING, romberg is negative, GAIT ASSISTED BY CANE.   DIAGNOSTICS/IMAGING:  01/17/13 MRI head: No acute infarct. Tiny area of blood breakdown products posterior left temporal lobe, left frontal lobe and right occipital lobe may represent result of prior hemorrhagic ischemia although result of prior trauma not entirely excluded. Broad-based (5 mm thickness) left hemispheric subdural  collection with cerebrospinal fluid intensity suggestive of cystic hygroma (versus chronic subdural hematoma). Prominent small vessel disease type changes. Global atrophy without hydrocephalus. Paranasal sinus mucosal thickening. Cervical spondylotic changes with spinal stenosis and slight cord flattening C3-4 and left so at the C2-3 level.   01/17/13 EEG: This is an abnormal EEG secondary to general background slowing. Although this can be seen with drowse, cannot rule out the possibility of diffuse cerebral disturbance that is etiologically nonspecific but may include a metabolic encephalopathy among other possibilities. Also noted was rare sharp activity over the left hemisphere.   10/06/13 MRI brain: moderate perisylvian and mesial temporal atrophy; moderate-severe chronic small vessel ischemic disease; numerous chronic cerebral microhemorrhages on SWAN views    ASSESSMENT/PLAN:  81 y.o.  right-handed male with Hypertension; Diabetes mellitus; GI bleed; ESRD (end stage renal disease); here for follow up of seizure disorder and memory loss.   Patient had seizure x 2 in May 2014, in the setting of pericortical microhemorrhage in the left temporal lobe and left sided sharp waves on EEG. Now stable on vimpat 50mg  BID.  Also had memory problems and metabolic encephalopathy, now much improved.   PLAN:  I spent 15 minutes of face to face time with patient. Greater than 50% of time was spent in counseling and coordination of care with patient. In summary we discussed:  - continue vimpat 50mg  BID  Meds ordered this encounter  Medications  . lacosamide (VIMPAT) 50 MG TABS tablet    Sig: Take 1 tablet (50 mg total) by mouth 2 (two) times daily.    Dispense:  180 tablet    Refill:  4    Pharmacy Fax [phone]   Return in about 1 year (around 04/16/2016).    [id number], MD 04/17/2015, 3:49 PM Certified in Neurology, Neurophysiology and Neuroimaging  [facility] [address] [place], [address] [place] [phone]

## 2015-04-18 DIAGNOSIS — N186 End stage renal disease: Secondary | ICD-10-CM | POA: Diagnosis not present

## 2015-04-18 DIAGNOSIS — D509 Iron deficiency anemia, unspecified: Secondary | ICD-10-CM | POA: Diagnosis not present

## 2015-04-18 DIAGNOSIS — D631 Anemia in chronic kidney disease: Secondary | ICD-10-CM | POA: Diagnosis not present

## 2015-04-21 DIAGNOSIS — N186 End stage renal disease: Secondary | ICD-10-CM | POA: Diagnosis not present

## 2015-04-21 DIAGNOSIS — D509 Iron deficiency anemia, unspecified: Secondary | ICD-10-CM | POA: Diagnosis not present

## 2015-04-21 DIAGNOSIS — D631 Anemia in chronic kidney disease: Secondary | ICD-10-CM | POA: Diagnosis not present

## 2015-04-22 ENCOUNTER — Encounter: Payer: Self-pay | Admitting: Podiatry

## 2015-04-22 ENCOUNTER — Ambulatory Visit (INDEPENDENT_AMBULATORY_CARE_PROVIDER_SITE_OTHER): Payer: Medicare Other | Admitting: Podiatry

## 2015-04-22 DIAGNOSIS — B351 Tinea unguium: Secondary | ICD-10-CM | POA: Diagnosis not present

## 2015-04-22 DIAGNOSIS — M79676 Pain in unspecified toe(s): Secondary | ICD-10-CM

## 2015-04-22 DIAGNOSIS — E1140 Type 2 diabetes mellitus with diabetic neuropathy, unspecified: Secondary | ICD-10-CM

## 2015-04-22 DIAGNOSIS — E119 Type 2 diabetes mellitus without complications: Secondary | ICD-10-CM | POA: Diagnosis not present

## 2015-04-22 DIAGNOSIS — L84 Corns and callosities: Secondary | ICD-10-CM | POA: Diagnosis not present

## 2015-04-22 NOTE — Progress Notes (Signed)
Patient ID: [name], male   DOB: Oct 19, 1931, 81 y.o.   MRN: [id number] Complaint:  Visit Type: Patient returns to my office for continued preventative foot care services. Complaint: Patient states" my nails have grown long and thick and become painful to walk and wear shoes" Patient has been diagnosed with DM with no foot complications. The patient presents for preventative foot care services. No changes to ROS  Podiatric Exam: Vascular: dorsalis pedis and posterior tibial pulses are palpable bilateral. Capillary return is immediate. Temperature gradient is WNL. Skin turgor WNL  Sensorium: Normal Semmes Weinstein monofilament test. Normal tactile sensation bilaterally. Nail Exam: Pt has thick disfigured discolored nails with subungual debris noted bilateral entire nail hallux through fifth toenails Ulcer Exam: There is no evidence of ulcer or pre-ulcerative changes or infection. Orthopedic Exam: Muscle tone and strength are WNL. No limitations in general ROM. No crepitus or effusions noted. Foot type and digits show no abnormalities. Bony prominences are unremarkable. Skin: No Porokeratosis. No infection or ulcers.  Hemorrhagic callus distal aspect right hallux.  Diagnosis:  Onychomycosis, , Pain in right toe, pain in left toes, Callus  Treatment & Plan Procedures and Treatment: Consent by patient was obtained for treatment procedures. The patient understood the discussion of treatment and procedures well. All questions were answered thoroughly reviewed. Debridement of mycotic and hypertrophic toenails, 1 through 5 bilateral and clearing of subungual debris. No ulceration, no infection noted. Debride callus right hallux.  Return Visit-Office Procedure: Patient instructed to return to the office for a follow up visit 3 months for continued evaluation and treatment.

## 2015-04-23 DIAGNOSIS — N186 End stage renal disease: Secondary | ICD-10-CM | POA: Diagnosis not present

## 2015-04-23 DIAGNOSIS — D509 Iron deficiency anemia, unspecified: Secondary | ICD-10-CM | POA: Diagnosis not present

## 2015-04-23 DIAGNOSIS — D631 Anemia in chronic kidney disease: Secondary | ICD-10-CM | POA: Diagnosis not present

## 2015-04-25 DIAGNOSIS — N186 End stage renal disease: Secondary | ICD-10-CM | POA: Diagnosis not present

## 2015-04-25 DIAGNOSIS — D509 Iron deficiency anemia, unspecified: Secondary | ICD-10-CM | POA: Diagnosis not present

## 2015-04-25 DIAGNOSIS — D631 Anemia in chronic kidney disease: Secondary | ICD-10-CM | POA: Diagnosis not present

## 2015-04-28 DIAGNOSIS — D631 Anemia in chronic kidney disease: Secondary | ICD-10-CM | POA: Diagnosis not present

## 2015-04-28 DIAGNOSIS — D509 Iron deficiency anemia, unspecified: Secondary | ICD-10-CM | POA: Diagnosis not present

## 2015-04-28 DIAGNOSIS — N186 End stage renal disease: Secondary | ICD-10-CM | POA: Diagnosis not present

## 2015-04-29 ENCOUNTER — Ambulatory Visit: Payer: Medicare Other | Admitting: Diagnostic Neuroimaging

## 2015-04-29 DIAGNOSIS — H35033 Hypertensive retinopathy, bilateral: Secondary | ICD-10-CM | POA: Diagnosis not present

## 2015-04-29 DIAGNOSIS — E11321 Type 2 diabetes mellitus with mild nonproliferative diabetic retinopathy with macular edema: Secondary | ICD-10-CM | POA: Diagnosis not present

## 2015-04-29 DIAGNOSIS — H40013 Open angle with borderline findings, low risk, bilateral: Secondary | ICD-10-CM | POA: Diagnosis not present

## 2015-05-02 DIAGNOSIS — D509 Iron deficiency anemia, unspecified: Secondary | ICD-10-CM | POA: Diagnosis not present

## 2015-05-02 DIAGNOSIS — N186 End stage renal disease: Secondary | ICD-10-CM | POA: Diagnosis not present

## 2015-05-02 DIAGNOSIS — D631 Anemia in chronic kidney disease: Secondary | ICD-10-CM | POA: Diagnosis not present

## 2015-05-05 DIAGNOSIS — D631 Anemia in chronic kidney disease: Secondary | ICD-10-CM | POA: Diagnosis not present

## 2015-05-05 DIAGNOSIS — N186 End stage renal disease: Secondary | ICD-10-CM | POA: Diagnosis not present

## 2015-05-05 DIAGNOSIS — D509 Iron deficiency anemia, unspecified: Secondary | ICD-10-CM | POA: Diagnosis not present

## 2015-05-07 DIAGNOSIS — D631 Anemia in chronic kidney disease: Secondary | ICD-10-CM | POA: Diagnosis not present

## 2015-05-07 DIAGNOSIS — Z992 Dependence on renal dialysis: Secondary | ICD-10-CM | POA: Diagnosis not present

## 2015-05-07 DIAGNOSIS — N186 End stage renal disease: Secondary | ICD-10-CM | POA: Diagnosis not present

## 2015-05-07 DIAGNOSIS — I120 Hypertensive chronic kidney disease with stage 5 chronic kidney disease or end stage renal disease: Secondary | ICD-10-CM | POA: Diagnosis not present

## 2015-05-07 DIAGNOSIS — D509 Iron deficiency anemia, unspecified: Secondary | ICD-10-CM | POA: Diagnosis not present

## 2015-05-08 ENCOUNTER — Encounter (INDEPENDENT_AMBULATORY_CARE_PROVIDER_SITE_OTHER): Payer: Medicare Other | Admitting: Ophthalmology

## 2015-05-08 DIAGNOSIS — E11311 Type 2 diabetes mellitus with unspecified diabetic retinopathy with macular edema: Secondary | ICD-10-CM

## 2015-05-08 DIAGNOSIS — S065XAA Traumatic subdural hemorrhage with loss of consciousness status unknown, initial encounter: Secondary | ICD-10-CM

## 2015-05-08 DIAGNOSIS — H35033 Hypertensive retinopathy, bilateral: Secondary | ICD-10-CM | POA: Diagnosis not present

## 2015-05-08 DIAGNOSIS — I10 Essential (primary) hypertension: Secondary | ICD-10-CM | POA: Diagnosis not present

## 2015-05-08 DIAGNOSIS — E11329 Type 2 diabetes mellitus with mild nonproliferative diabetic retinopathy without macular edema: Secondary | ICD-10-CM

## 2015-05-08 DIAGNOSIS — H43813 Vitreous degeneration, bilateral: Secondary | ICD-10-CM

## 2015-05-08 DIAGNOSIS — S065X9A Traumatic subdural hemorrhage with loss of consciousness of unspecified duration, initial encounter: Secondary | ICD-10-CM

## 2015-05-08 DIAGNOSIS — E11331 Type 2 diabetes mellitus with moderate nonproliferative diabetic retinopathy with macular edema: Secondary | ICD-10-CM

## 2015-05-08 HISTORY — DX: Traumatic subdural hemorrhage with loss of consciousness status unknown, initial encounter: S06.5XAA

## 2015-05-08 HISTORY — DX: Traumatic subdural hemorrhage with loss of consciousness of unspecified duration, initial encounter: S06.5X9A

## 2015-05-12 DIAGNOSIS — N186 End stage renal disease: Secondary | ICD-10-CM | POA: Diagnosis not present

## 2015-05-12 DIAGNOSIS — D509 Iron deficiency anemia, unspecified: Secondary | ICD-10-CM | POA: Diagnosis not present

## 2015-05-12 DIAGNOSIS — D631 Anemia in chronic kidney disease: Secondary | ICD-10-CM | POA: Diagnosis not present

## 2015-05-13 ENCOUNTER — Other Ambulatory Visit: Payer: Medicare Other

## 2015-05-13 DIAGNOSIS — Z992 Dependence on renal dialysis: Secondary | ICD-10-CM

## 2015-05-13 DIAGNOSIS — N186 End stage renal disease: Secondary | ICD-10-CM

## 2015-05-13 DIAGNOSIS — E119 Type 2 diabetes mellitus without complications: Secondary | ICD-10-CM

## 2015-05-13 DIAGNOSIS — N189 Chronic kidney disease, unspecified: Principal | ICD-10-CM

## 2015-05-13 DIAGNOSIS — D631 Anemia in chronic kidney disease: Secondary | ICD-10-CM | POA: Diagnosis not present

## 2015-05-14 DIAGNOSIS — D509 Iron deficiency anemia, unspecified: Secondary | ICD-10-CM | POA: Diagnosis not present

## 2015-05-14 DIAGNOSIS — N186 End stage renal disease: Secondary | ICD-10-CM | POA: Diagnosis not present

## 2015-05-14 DIAGNOSIS — D631 Anemia in chronic kidney disease: Secondary | ICD-10-CM | POA: Diagnosis not present

## 2015-05-14 LAB — CBC WITH DIFFERENTIAL/PLATELET
Basophils Absolute: 0 10*3/uL (ref 0.0–0.2)
Basos: 0 %
EOS (ABSOLUTE): 0.1 10*3/uL (ref 0.0–0.4)
Eos: 4 %
Hematocrit: 37.9 % (ref 37.5–51.0)
Hemoglobin: 12.4 g/dL — ABNORMAL LOW (ref 12.6–17.7)
Immature Grans (Abs): 0 10*3/uL (ref 0.0–0.1)
Immature Granulocytes: 0 %
Lymphocytes Absolute: 1.3 10*3/uL (ref 0.7–3.1)
Lymphs: 39 %
MCH: 34.2 pg — ABNORMAL HIGH (ref 26.6–33.0)
MCHC: 32.7 g/dL (ref 31.5–35.7)
MCV: 104 fL — ABNORMAL HIGH (ref 79–97)
Monocytes Absolute: 0.3 10*3/uL (ref 0.1–0.9)
Monocytes: 8 %
Neutrophils Absolute: 1.6 10*3/uL (ref 1.4–7.0)
Neutrophils: 49 %
Platelets: 128 10*3/uL — ABNORMAL LOW (ref 150–379)
RBC: 3.63 x10E6/uL — ABNORMAL LOW (ref 4.14–5.80)
RDW: 17.5 % — ABNORMAL HIGH (ref 12.3–15.4)
WBC: 3.3 10*3/uL — ABNORMAL LOW (ref 3.4–10.8)

## 2015-05-14 LAB — LIPID PANEL
Chol/HDL Ratio: 1.6 ratio units (ref 0.0–5.0)
Cholesterol, Total: 107 mg/dL (ref 100–199)
HDL: 66 mg/dL (ref >39)
LDL Calculated: 31 mg/dL (ref 0–99)
Triglycerides: 48 mg/dL (ref 0–149)
VLDL Cholesterol Cal: 10 mg/dL (ref 5–40)

## 2015-05-14 LAB — BASIC METABOLIC PANEL
BUN/Creatinine Ratio: 8 — ABNORMAL LOW (ref 10–22)
BUN: 56 mg/dL — ABNORMAL HIGH (ref 8–27)
CO2: 25 mmol/L (ref 18–29)
Calcium: 9.6 mg/dL (ref 8.6–10.2)
Chloride: 97 mmol/L (ref 97–108)
Creatinine, Ser: 7.16 mg/dL — ABNORMAL HIGH (ref 0.76–1.27)
GFR calc Af Amer: 8 mL/min/{1.73_m2} — ABNORMAL LOW (ref >59)
GFR calc non Af Amer: 7 mL/min/{1.73_m2} — ABNORMAL LOW (ref >59)
Glucose: 127 mg/dL — ABNORMAL HIGH (ref 65–99)
Potassium: 5.1 mmol/L (ref 3.5–5.2)
Sodium: 141 mmol/L (ref 134–144)

## 2015-05-14 LAB — HEMOGLOBIN A1C
Est. average glucose Bld gHb Est-mCnc: 97 mg/dL
Hgb A1c MFr Bld: 5 % (ref 4.8–5.6)

## 2015-05-15 ENCOUNTER — Telehealth: Payer: Self-pay

## 2015-05-15 MED ORDER — ZOSTER VACCINE LIVE 19400 UNT/0.65ML ~~LOC~~ SOLR: 0.6500 mL | Freq: Once | SUBCUTANEOUS | Status: DC

## 2015-05-15 MED ORDER — TETANUS-DIPHTH-ACELL PERTUSSIS 5-2.5-18.5 LF-MCG/0.5 IM SUSP: 0.5000 mL | Freq: Once | INTRAMUSCULAR | Status: DC

## 2015-05-15 NOTE — Telephone Encounter (Signed)
Patient will be seen tomorrow, RX's will be given at appointment

## 2015-05-16 ENCOUNTER — Encounter: Payer: Self-pay | Admitting: Internal Medicine

## 2015-05-16 ENCOUNTER — Ambulatory Visit (INDEPENDENT_AMBULATORY_CARE_PROVIDER_SITE_OTHER): Payer: Medicare Other | Admitting: Internal Medicine

## 2015-05-16 VITALS — BP 136/74 | HR 61 | Temp 97.5°F | Ht 70.0 in | Wt 211.0 lb

## 2015-05-16 DIAGNOSIS — I150 Renovascular hypertension: Secondary | ICD-10-CM | POA: Diagnosis not present

## 2015-05-16 DIAGNOSIS — M5136 Other intervertebral disc degeneration, lumbar region: Secondary | ICD-10-CM

## 2015-05-16 DIAGNOSIS — N189 Chronic kidney disease, unspecified: Secondary | ICD-10-CM

## 2015-05-16 DIAGNOSIS — G40909 Epilepsy, unspecified, not intractable, without status epilepticus: Secondary | ICD-10-CM

## 2015-05-16 DIAGNOSIS — G3184 Mild cognitive impairment, so stated: Secondary | ICD-10-CM

## 2015-05-16 DIAGNOSIS — Z23 Encounter for immunization: Secondary | ICD-10-CM | POA: Diagnosis not present

## 2015-05-16 DIAGNOSIS — N2581 Secondary hyperparathyroidism of renal origin: Secondary | ICD-10-CM

## 2015-05-16 DIAGNOSIS — N186 End stage renal disease: Secondary | ICD-10-CM | POA: Diagnosis not present

## 2015-05-16 DIAGNOSIS — M51369 Other intervertebral disc degeneration, lumbar region without mention of lumbar back pain or lower extremity pain: Secondary | ICD-10-CM

## 2015-05-16 DIAGNOSIS — E119 Type 2 diabetes mellitus without complications: Secondary | ICD-10-CM

## 2015-05-16 DIAGNOSIS — Z992 Dependence on renal dialysis: Secondary | ICD-10-CM

## 2015-05-16 DIAGNOSIS — D631 Anemia in chronic kidney disease: Secondary | ICD-10-CM | POA: Diagnosis not present

## 2015-05-16 NOTE — Patient Instructions (Signed)

## 2015-05-16 NOTE — Progress Notes (Signed)
Patient ID: [name], male   DOB: 12-02-1931, 81 y.o.   MRN: [id number]   Location:  [facility]  Code Status: DNR Goals of Care: Advanced Directive information Does patient have an advance directive?: Yes, Type of Advance Directive: Healthcare Power of Attorney, Does patient want to make changes to advanced directive?: No - Patient declined   Chief Complaint  Patient presents with  . Medical Management of Chronic Issues    3 month follow-up, ongoing back concern   . Immunizations    Flu vaccine today, RX for Tdap and Zoster given     HPI: Patient is a 81 y.o. black male seen in the office today for med mgt of chronic diseases.    He continues to have difficulty with back pain which was exacerbated by sitting through an extensive eye exam and procedure.  Says they gave him something that made his urine a funny color.  Back pain goes down his legs, but mostly painful in the middle.  Just resumed exercise program yesterday.  Cannot do exercises with resistance.  Did full bicycle ride yesterday.  Going to HD.  Only issue is his back pain.  They are taking him off when he's had enough now.    HTN:  bp ok.    DMII:  112 this am fasting.  Checks before breakfast and after meals and at bedtime so 2-3x he says.   Some postprandial over 200, but denies any over 300.  Only once had a reading of 90.  Hba1c under excellent control at 5.  Seizures:  None recently.  Follows with Dr. Danae Orleans.    Not falling.  Has bed that he can change positions with.  Gout:  No recent flares.  Has his colchicine if needed.  LaSt flare was in his pinky finger.    Constipation:  Bowels are regular.  Ate some ancient mayo in the fridge at one point and he thinks it was bad--was up all night with belly pain and diarrhea.    Reviewed labs with pt and his wife.    Review of Systems:  Review of Systems  Constitutional: Negative for fever, chills and malaise/fatigue.    HENT: Negative for congestion.   Eyes: Negative for blurred vision.       Glasses  Respiratory: Negative for shortness of breath.   Cardiovascular: Negative for chest pain and leg swelling.  Gastrointestinal: Negative for abdominal pain, constipation, blood in stool and melena.  Genitourinary: Negative for dysuria.  Musculoskeletal: Positive for back pain. Negative for falls.  Skin: Negative for itching and rash.  Neurological: Negative for dizziness, loss of consciousness and weakness.  Psychiatric/Behavioral: Positive for memory loss. Negative for depression.    Past Medical History  Diagnosis Date  . Hypertension   . Diabetes mellitus without complication   . Arthritis   . History of blood transfusion   . GI bleed   . ESRD (end stage renal disease)   . Seizure   . Dialysis patient     Past Surgical History  Procedure Laterality Date  . Esophagogastroduodenoscopy  10/03/2012    Procedure: ESOPHAGOGASTRODUODENOSCOPY (EGD);  Surgeon: Theda Belfast, MD;  Location: [facility] ENDOSCOPY;  Service: Endoscopy;  Laterality: N/A;  . Colonoscopy  10/04/2012    Procedure: COLONOSCOPY;  Surgeon: Theda Belfast, MD;  Location: [facility] ENDOSCOPY;  Service: Endoscopy;  Laterality: N/A;  . Colonoscopy with esophagogastroduodenoscopy (egd) Left 11/30/2012    Procedure: COLONOSCOPY WITH ESOPHAGOGASTRODUODENOSCOPY (EGD);  Surgeon: Theda Belfast, MD;  Location: [facility] ENDOSCOPY;  Service: Endoscopy;  Laterality: Left;  . Spine surgery  2004    back fusion/ [facility] Penumalli,MD  . Retinal laser procedure Right 04/2015    Allergies  Allergen Reactions  . Aspirin Other (See Comments)    Bleeding   . Lactose Intolerance (Gi) Diarrhea   Medications: Patient's Medications  New Prescriptions   No medications on file  Previous Medications   ACCU-CHEK AVIVA PLUS TEST STRIP    Check blood sugar three times daily   ACETAMINOPHEN (TYLENOL) 500 MG TABLET    Take 500 mg by mouth. Take as needed   AMLODIPINE  (NORVASC) 10 MG TABLET    Take 10 mg by mouth Daily.    CALCIUM ACETATE (PHOSLO) 667 MG CAPSULE    Take 667 mg by mouth 3 (three) times daily.   COLCHICINE 0.6 MG TABLET    as needed.    CYCLOBENZAPRINE (FLEXERIL) 5 MG TABLET    Take 5 mg by mouth daily as needed.   DARBEPOETIN (ARANESP) 60 MCG/0.3ML SOLN INJECTION    Inject 0.3 mLs (60 mcg total) into the vein every Thursday with hemodialysis.   DOXERCALCIFEROL (HECTOROL) 4 MCG/2ML INJECTION    Inject 2 mLs (4 mcg total) into the vein Every Tuesday,Thursday,and Saturday with dialysis.   FEXOFENADINE (ALLEGRA) 180 MG TABLET    Take 180 mg by mouth daily. As needed   FOLIC ACID (FOLVITE) 1 MG TABLET    Take 1 tablet (1 mg total) by mouth daily.   GABAPENTIN (NEURONTIN) 100 MG CAPSULE    Take 1 capsule (100 mg total) by mouth 3 (three) times daily.   HUMALOG KWIKPEN 100 UNIT/ML KIWKPEN    Sliding Scale   INSULIN LISPRO (HUMALOG) 100 UNIT/ML CARTRIDGE    Inject into the skin. Sliding scale   LACOSAMIDE (VIMPAT) 50 MG TABS TABLET    Take 1 tablet (50 mg total) by mouth 2 (two) times daily.   MULTIVITAMIN (RENA-VIT) TABS TABLET    Take 1 tablet by mouth at bedtime.   SENNA-DOCUSATE (SENOKOT-S) 8.6-50 MG PER TABLET    Take 1 tablet by mouth. As needed   SODIUM CHLORIDE 0.9 % SOLN 100 ML WITH FERRIC GLUCONATE 12.5 MG/ML SOLN 62.5 MG    Inject 62.5 mg into the vein every Thursday with hemodialysis.   TDAP (BOOSTRIX) 5-2.5-18.5 LF-MCG/0.5 INJECTION    Inject 0.5 mLs into the muscle once.   TRIAMCINOLONE (NASACORT ALLERGY 24HR) 55 MCG/ACT AERO NASAL INHALER    Place 1 spray into the nose. One spray nasal as needed for allery   ZOSTER VACCINE LIVE, PF, (ZOSTAVAX) 49702 UNT/0.65ML INJECTION    Inject 19,400 Units into the skin once.  Modified Medications   No medications on file  Discontinued Medications   No medications on file   Physical Exam: Filed Vitals:   05/16/15 1029  BP: 136/74  Pulse: 61  Temp: 97.5 F (36.4 C)  TempSrc: Oral  Height: 5'  10" (1.778 m)  Weight: 211 lb (95.709 kg)  SpO2: 96%   Physical Exam  Constitutional: He appears well-developed and well-nourished. No distress.  Cardiovascular:  Murmur heard. Systolic murmur loudest in aortic area, but audible throughout precordium  Pulmonary/Chest: Effort normal and breath sounds normal. No respiratory distress.  Abdominal: Soft. Bowel sounds are normal. He exhibits no distension. There is no tenderness.  Musculoskeletal: He exhibits no tenderness.  Stooped posture, walks with cane, has to use both arms to get up out  of chair  Neurological: He is alert.  Oriented to person, place, not precise time  Skin: Skin is warm and dry.    Labs reviewed: Basic Metabolic Panel:  Recent Labs  16/10/96 1059 05/13/15 0925  NA 142 141  K 5.0 5.1  CL 97 97  CO2 28 25  GLUCOSE 134* 127*  BUN 53* 56*  CREATININE 6.86* 7.16*  CALCIUM 9.7 9.6   Liver Function Tests:  Recent Labs  08/15/14 1059  AST 24  ALT 15  ALKPHOS 56  BILITOT 0.4  PROT 7.1   No results for input(s): LIPASE, AMYLASE in the last 8760 hours. No results for input(s): AMMONIA in the last 8760 hours. CBC:  Recent Labs  08/15/14 1059 05/13/15 0925  WBC 3.4 3.3*  NEUTROABS 1.9 1.6  HGB 9.9*  --   HCT 29.8* 37.9  MCV 93  --   PLT 94*  --    Lipid Panel:  Recent Labs  05/13/15 0925  CHOL 107  HDL 66  LDLCALC 31  TRIG 48  CHOLHDL 1.6   Lab Results  Component Value Date   HGBA1C 5.0 05/13/2015    Assessment/Plan 1. Degenerative disc disease, lumbar - seems he's had a flare-up of his back pain since his eye surgical procedure (retinas) where he had to be lying supine for a prolonged time -has just now resumed his exercise regimen as of yesterday -advised use of tylenol but he thinks this is bad for him and I don't think he was convinced -follows with Dr. Myrtie Neither about his back -recommended continued heat and traction for pain  2. Diabetes type 2, controlled - under  great control; he denies lows, but did not bring his glucometer for me to review -continues on humalog "sliding scale"--unsure what the scale is and how often he needs to take the insulin -these two were supposed to only come in with their son, never alone b/c they both have memory impairment - Hemoglobin A1c; Future - Lipid panel; Future  3. ESRD on dialysis -continues HD sessions which seem to be going better now in terms of stopping when his discomfort is too much -admits his right upper arm AV fistula does have some increased bleeding vs. Before  -get prn aranesp  4. Anemia in chronic kidney disease - has been stable on most recent labs, cont to monitor - CBC with Differential/Platelet; Future  5. Seizure disorder -continues on vimpat  -no seizures recently  6. Renovascular hypertension -bp well controlled with only amlodipine  7. Secondary hyperparathyroidism, renal -continues on phoslo for this, rena-vit as per HD  8. Mild cognitive impairment with memory loss -he compensates very well, but retells stories, asks questions multiple times and does not know his own age (thought he was 36 instead of 55) -I would like for him to take memory medication, but he does not think he has a problem and his son was not with him today  9. Need for prophylactic vaccination and inoculation against influenza - Flu Vaccine QUAD 36+ mos PF IM (Fluarix & Fluzone Quad PF) was given today and his wife got hers also -their Rxs for tdap and shingles were given again, too, to obtain at the pharmacy  Labs/tests ordered:   Orders Placed This Encounter  Procedures  . Flu Vaccine QUAD 36+ mos PF IM (Fluarix & Fluzone Quad PF)  . CBC with Differential/Platelet    Standing Status: Future     Number of Occurrences:  Standing Expiration Date: 11/13/2015  . Hemoglobin A1c    Standing Status: Future     Number of Occurrences:      Standing Expiration Date: 11/13/2015  . Lipid panel    Standing  Status: Future     Number of Occurrences:      Standing Expiration Date: 11/13/2015    Order Specific Question:  Has the patient fasted?    Answer:  Yes    Next appt:  3 mos with labs before  Kayvan Hoefling L. Ceaser Ebeling, D.O. Geriatrics [facility] [address], [place] [address] Cell Phone (Mon-Fri 8am-5pm):  [phone] On Call:  [phone] & follow prompts after 5pm & weekends Office Phone:  [phone] Office Fax:  [phone]

## 2015-05-19 ENCOUNTER — Inpatient Hospital Stay (HOSPITAL_COMMUNITY)
Admission: EM | Admit: 2015-05-19 | Discharge: 2015-05-23 | DRG: 085 | Disposition: A | Discharge status: Skilled Nursing Facility | Payer: Medicare Other | Attending: Internal Medicine | Admitting: Internal Medicine

## 2015-05-19 ENCOUNTER — Encounter (HOSPITAL_COMMUNITY): Payer: Self-pay | Admitting: Emergency Medicine

## 2015-05-19 ENCOUNTER — Emergency Department (HOSPITAL_COMMUNITY): Payer: Medicare Other

## 2015-05-19 DIAGNOSIS — S065X0A Traumatic subdural hemorrhage without loss of consciousness, initial encounter: Principal | ICD-10-CM | POA: Diagnosis present

## 2015-05-19 DIAGNOSIS — N189 Chronic kidney disease, unspecified: Secondary | ICD-10-CM

## 2015-05-19 DIAGNOSIS — W19XXXA Unspecified fall, initial encounter: Secondary | ICD-10-CM | POA: Diagnosis not present

## 2015-05-19 DIAGNOSIS — Z9115 Patient's noncompliance with renal dialysis: Secondary | ICD-10-CM | POA: Diagnosis not present

## 2015-05-19 DIAGNOSIS — I6200 Nontraumatic subdural hemorrhage, unspecified: Secondary | ICD-10-CM | POA: Diagnosis not present

## 2015-05-19 DIAGNOSIS — N186 End stage renal disease: Secondary | ICD-10-CM | POA: Diagnosis not present

## 2015-05-19 DIAGNOSIS — R4182 Altered mental status, unspecified: Secondary | ICD-10-CM | POA: Diagnosis not present

## 2015-05-19 DIAGNOSIS — E872 Acidosis: Secondary | ICD-10-CM | POA: Diagnosis not present

## 2015-05-19 DIAGNOSIS — E1122 Type 2 diabetes mellitus with diabetic chronic kidney disease: Secondary | ICD-10-CM | POA: Diagnosis not present

## 2015-05-19 DIAGNOSIS — R4701 Aphasia: Secondary | ICD-10-CM | POA: Diagnosis not present

## 2015-05-19 DIAGNOSIS — Z794 Long term (current) use of insulin: Secondary | ICD-10-CM

## 2015-05-19 DIAGNOSIS — E119 Type 2 diabetes mellitus without complications: Secondary | ICD-10-CM | POA: Diagnosis not present

## 2015-05-19 DIAGNOSIS — M109 Gout, unspecified: Secondary | ICD-10-CM | POA: Diagnosis not present

## 2015-05-19 DIAGNOSIS — S0101XA Laceration without foreign body of scalp, initial encounter: Secondary | ICD-10-CM | POA: Diagnosis present

## 2015-05-19 DIAGNOSIS — Z886 Allergy status to analgesic agent status: Secondary | ICD-10-CM

## 2015-05-19 DIAGNOSIS — E1121 Type 2 diabetes mellitus with diabetic nephropathy: Secondary | ICD-10-CM | POA: Diagnosis not present

## 2015-05-19 DIAGNOSIS — M1990 Unspecified osteoarthritis, unspecified site: Secondary | ICD-10-CM | POA: Diagnosis not present

## 2015-05-19 DIAGNOSIS — R51 Headache: Secondary | ICD-10-CM | POA: Diagnosis not present

## 2015-05-19 DIAGNOSIS — I120 Hypertensive chronic kidney disease with stage 5 chronic kidney disease or end stage renal disease: Secondary | ICD-10-CM | POA: Diagnosis not present

## 2015-05-19 DIAGNOSIS — S065X3S Traumatic subdural hemorrhage with loss of consciousness of 1 hour to 5 hours 59 minutes, sequela: Secondary | ICD-10-CM | POA: Diagnosis not present

## 2015-05-19 DIAGNOSIS — G8929 Other chronic pain: Secondary | ICD-10-CM | POA: Diagnosis not present

## 2015-05-19 DIAGNOSIS — D696 Thrombocytopenia, unspecified: Secondary | ICD-10-CM | POA: Diagnosis present

## 2015-05-19 DIAGNOSIS — S065X9A Traumatic subdural hemorrhage with loss of consciousness of unspecified duration, initial encounter: Secondary | ICD-10-CM | POA: Diagnosis not present

## 2015-05-19 DIAGNOSIS — Z981 Arthrodesis status: Secondary | ICD-10-CM

## 2015-05-19 DIAGNOSIS — N2581 Secondary hyperparathyroidism of renal origin: Secondary | ICD-10-CM | POA: Diagnosis not present

## 2015-05-19 DIAGNOSIS — I10 Essential (primary) hypertension: Secondary | ICD-10-CM | POA: Diagnosis not present

## 2015-05-19 DIAGNOSIS — M5136 Other intervertebral disc degeneration, lumbar region: Secondary | ICD-10-CM

## 2015-05-19 DIAGNOSIS — T148 Other injury of unspecified body region: Secondary | ICD-10-CM | POA: Diagnosis not present

## 2015-05-19 DIAGNOSIS — K5521 Angiodysplasia of colon with hemorrhage: Secondary | ICD-10-CM

## 2015-05-19 DIAGNOSIS — Z91018 Allergy to other foods: Secondary | ICD-10-CM

## 2015-05-19 DIAGNOSIS — M6281 Muscle weakness (generalized): Secondary | ICD-10-CM | POA: Diagnosis not present

## 2015-05-19 DIAGNOSIS — S0191XA Laceration without foreign body of unspecified part of head, initial encounter: Secondary | ICD-10-CM | POA: Diagnosis not present

## 2015-05-19 DIAGNOSIS — S0101XD Laceration without foreign body of scalp, subsequent encounter: Secondary | ICD-10-CM | POA: Diagnosis not present

## 2015-05-19 DIAGNOSIS — E1129 Type 2 diabetes mellitus with other diabetic kidney complication: Secondary | ICD-10-CM | POA: Diagnosis not present

## 2015-05-19 DIAGNOSIS — R2681 Unsteadiness on feet: Secondary | ICD-10-CM | POA: Diagnosis not present

## 2015-05-19 DIAGNOSIS — Q2733 Arteriovenous malformation of digestive system vessel: Secondary | ICD-10-CM

## 2015-05-19 DIAGNOSIS — M25562 Pain in left knee: Secondary | ICD-10-CM | POA: Diagnosis not present

## 2015-05-19 DIAGNOSIS — M898X9 Other specified disorders of bone, unspecified site: Secondary | ICD-10-CM | POA: Diagnosis not present

## 2015-05-19 DIAGNOSIS — W0110XA Fall on same level from slipping, tripping and stumbling with subsequent striking against unspecified object, initial encounter: Secondary | ICD-10-CM | POA: Diagnosis present

## 2015-05-19 DIAGNOSIS — D631 Anemia in chronic kidney disease: Secondary | ICD-10-CM | POA: Diagnosis not present

## 2015-05-19 DIAGNOSIS — S065X0D Traumatic subdural hemorrhage without loss of consciousness, subsequent encounter: Secondary | ICD-10-CM | POA: Diagnosis not present

## 2015-05-19 DIAGNOSIS — R278 Other lack of coordination: Secondary | ICD-10-CM | POA: Diagnosis not present

## 2015-05-19 DIAGNOSIS — Z79899 Other long term (current) drug therapy: Secondary | ICD-10-CM | POA: Diagnosis not present

## 2015-05-19 DIAGNOSIS — G40909 Epilepsy, unspecified, not intractable, without status epilepticus: Secondary | ICD-10-CM

## 2015-05-19 DIAGNOSIS — D638 Anemia in other chronic diseases classified elsewhere: Secondary | ICD-10-CM | POA: Diagnosis not present

## 2015-05-19 DIAGNOSIS — I639 Cerebral infarction, unspecified: Secondary | ICD-10-CM

## 2015-05-19 DIAGNOSIS — E8729 Other acidosis: Secondary | ICD-10-CM | POA: Diagnosis present

## 2015-05-19 DIAGNOSIS — S0190XA Unspecified open wound of unspecified part of head, initial encounter: Secondary | ICD-10-CM | POA: Diagnosis not present

## 2015-05-19 DIAGNOSIS — Z992 Dependence on renal dialysis: Secondary | ICD-10-CM

## 2015-05-19 DIAGNOSIS — S065XAA Traumatic subdural hemorrhage with loss of consciousness status unknown, initial encounter: Secondary | ICD-10-CM | POA: Diagnosis present

## 2015-05-19 DIAGNOSIS — M542 Cervicalgia: Secondary | ICD-10-CM | POA: Diagnosis not present

## 2015-05-19 DIAGNOSIS — Z9181 History of falling: Secondary | ICD-10-CM | POA: Diagnosis not present

## 2015-05-19 DIAGNOSIS — E875 Hyperkalemia: Secondary | ICD-10-CM | POA: Diagnosis not present

## 2015-05-19 LAB — CBC WITH DIFFERENTIAL/PLATELET
Basophils Absolute: 0 10*3/uL (ref 0.0–0.1)
Basophils Relative: 0 % (ref 0–1)
EOS PCT: 2 % (ref 0–5)
Eosinophils Absolute: 0.1 10*3/uL (ref 0.0–0.7)
HCT: 36.2 % — ABNORMAL LOW (ref 39.0–52.0)
Hemoglobin: 12.1 g/dL — ABNORMAL LOW (ref 13.0–17.0)
LYMPHS ABS: 0.9 10*3/uL (ref 0.7–4.0)
LYMPHS PCT: 22 % (ref 12–46)
MCH: 34.4 pg — AB (ref 26.0–34.0)
MCHC: 33.4 g/dL (ref 30.0–36.0)
MCV: 102.8 fL — AB (ref 78.0–100.0)
MONO ABS: 0.2 10*3/uL (ref 0.1–1.0)
Monocytes Relative: 4 % (ref 3–12)
Neutro Abs: 3 10*3/uL (ref 1.7–7.7)
Neutrophils Relative %: 72 % (ref 43–77)
PLATELETS: 96 10*3/uL — AB (ref 150–400)
RBC: 3.52 MIL/uL — ABNORMAL LOW (ref 4.22–5.81)
RDW: 16.5 % — AB (ref 11.5–15.5)
WBC: 4.1 10*3/uL (ref 4.0–10.5)

## 2015-05-19 LAB — BASIC METABOLIC PANEL
Anion gap: 17 — ABNORMAL HIGH (ref 5–15)
BUN: 121 mg/dL — AB (ref 6–20)
CHLORIDE: 104 mmol/L (ref 101–111)
CO2: 19 mmol/L — ABNORMAL LOW (ref 22–32)
Calcium: 9.8 mg/dL (ref 8.9–10.3)
Creatinine, Ser: 11.43 mg/dL — ABNORMAL HIGH (ref 0.61–1.24)
GFR calc Af Amer: 4 mL/min — ABNORMAL LOW (ref >60)
GFR calc non Af Amer: 4 mL/min — ABNORMAL LOW (ref >60)
GLUCOSE: 126 mg/dL — AB (ref 65–99)
POTASSIUM: 6.4 mmol/L — AB (ref 3.5–5.1)
Sodium: 140 mmol/L (ref 135–145)

## 2015-05-19 LAB — MAGNESIUM: Magnesium: 2.2 mg/dL (ref 1.7–2.4)

## 2015-05-19 LAB — APTT: aPTT: 33 seconds (ref 24–37)

## 2015-05-19 LAB — MRSA PCR SCREENING: MRSA BY PCR: NEGATIVE

## 2015-05-19 MED ORDER — SODIUM CHLORIDE 0.9 % IJ SOLN
3.0000 mL | Freq: Two times a day (BID) | INTRAMUSCULAR | Status: DC
  Administered 2015-05-21: 3 mL via INTRAVENOUS

## 2015-05-19 MED ORDER — ACETAMINOPHEN 650 MG RE SUPP: 650.0000 mg | Freq: Four times a day (QID) | RECTAL | Status: DC | PRN

## 2015-05-19 MED ORDER — OXYCODONE HCL 5 MG PO TABS
5.0000 mg | ORAL_TABLET | ORAL | Status: DC | PRN
  Administered 2015-05-19 – 2015-05-23 (×4): 5 mg via ORAL
  Filled 2015-05-19 (×3): qty 1

## 2015-05-19 MED ORDER — SENNOSIDES-DOCUSATE SODIUM 8.6-50 MG PO TABS: 1.0000 | ORAL_TABLET | Freq: Every evening | ORAL | Status: DC | PRN

## 2015-05-19 MED ORDER — SODIUM CHLORIDE 0.9 % IJ SOLN
3.0000 mL | Freq: Two times a day (BID) | INTRAMUSCULAR | Status: DC
  Administered 2015-05-19 – 2015-05-21 (×3): 3 mL via INTRAVENOUS

## 2015-05-19 MED ORDER — LIDOCAINE-PRILOCAINE 2.5-2.5 % EX CREA: 1.0000 "application " | TOPICAL_CREAM | CUTANEOUS | Status: DC | PRN

## 2015-05-19 MED ORDER — CYCLOBENZAPRINE HCL 5 MG PO TABS
5.0000 mg | ORAL_TABLET | Freq: Every day | ORAL | Status: DC | PRN
  Filled 2015-05-19: qty 1

## 2015-05-19 MED ORDER — ALTEPLASE 2 MG IJ SOLR
2.0000 mg | Freq: Once | INTRAMUSCULAR | Status: DC | PRN
  Filled 2015-05-19: qty 2

## 2015-05-19 MED ORDER — ONDANSETRON HCL 4 MG/2ML IJ SOLN: 4.0000 mg | Freq: Four times a day (QID) | INTRAMUSCULAR | Status: DC | PRN

## 2015-05-19 MED ORDER — SODIUM CHLORIDE 0.9 % IV SOLN: 250.0000 mL | INTRAVENOUS | Status: DC | PRN

## 2015-05-19 MED ORDER — LORATADINE 10 MG PO TABS
10.0000 mg | ORAL_TABLET | ORAL | Status: DC
  Administered 2015-05-19 – 2015-05-21 (×2): 10 mg via ORAL
  Filled 2015-05-19 (×3): qty 1

## 2015-05-19 MED ORDER — SODIUM CHLORIDE 0.9 % IV SOLN: 100.0000 mL | INTRAVENOUS | Status: DC | PRN

## 2015-05-19 MED ORDER — ACETAMINOPHEN 325 MG PO TABS
650.0000 mg | ORAL_TABLET | Freq: Four times a day (QID) | ORAL | Status: DC | PRN
  Administered 2015-05-21: 650 mg via ORAL

## 2015-05-19 MED ORDER — RENA-VITE PO TABS
1.0000 | ORAL_TABLET | Freq: Every day | ORAL | Status: DC
  Administered 2015-05-19 – 2015-05-22 (×4): 1 via ORAL
  Filled 2015-05-19 (×4): qty 1

## 2015-05-19 MED ORDER — SODIUM CHLORIDE 0.9 % IJ SOLN: 3.0000 mL | INTRAMUSCULAR | Status: DC | PRN

## 2015-05-19 MED ORDER — SORBITOL 70 % SOLN
30.0000 mL | Freq: Every day | Status: DC | PRN
  Filled 2015-05-19: qty 30

## 2015-05-19 MED ORDER — LIDOCAINE HCL (PF) 1 % IJ SOLN
30.0000 mL | Freq: Once | INTRAMUSCULAR | Status: AC
  Administered 2015-05-19: 30 mL via INTRADERMAL
  Filled 2015-05-19: qty 30

## 2015-05-19 MED ORDER — AMLODIPINE BESYLATE 10 MG PO TABS
10.0000 mg | ORAL_TABLET | Freq: Every day | ORAL | Status: DC
  Administered 2015-05-19 – 2015-05-23 (×5): 10 mg via ORAL
  Filled 2015-05-19 (×5): qty 1

## 2015-05-19 MED ORDER — LACOSAMIDE 50 MG PO TABS
50.0000 mg | ORAL_TABLET | Freq: Two times a day (BID) | ORAL | Status: DC
  Administered 2015-05-19 – 2015-05-21 (×5): 50 mg via ORAL
  Filled 2015-05-19 (×6): qty 1

## 2015-05-19 MED ORDER — PENTAFLUOROPROP-TETRAFLUOROETH EX AERO: 1.0000 "application " | INHALATION_SPRAY | CUTANEOUS | Status: DC | PRN

## 2015-05-19 MED ORDER — CALCITRIOL 0.5 MCG PO CAPS
1.0000 ug | ORAL_CAPSULE | ORAL | Status: DC
Start: 2015-05-21 — End: 2015-05-23
  Administered 2015-05-21 – 2015-05-23 (×2): 1 ug via ORAL
  Filled 2015-05-19 (×4): qty 2

## 2015-05-19 MED ORDER — FOLIC ACID 1 MG PO TABS
1.0000 mg | ORAL_TABLET | Freq: Every day | ORAL | Status: DC
  Administered 2015-05-19 – 2015-05-23 (×5): 1 mg via ORAL
  Filled 2015-05-19 (×5): qty 1

## 2015-05-19 MED ORDER — LIDOCAINE HCL (PF) 1 % IJ SOLN: 5.0000 mL | INTRAMUSCULAR | Status: DC | PRN

## 2015-05-19 MED ORDER — ALBUTEROL SULFATE (2.5 MG/3ML) 0.083% IN NEBU: 2.5000 mg | INHALATION_SOLUTION | RESPIRATORY_TRACT | Status: DC | PRN

## 2015-05-19 MED ORDER — PANTOPRAZOLE SODIUM 40 MG PO TBEC
40.0000 mg | DELAYED_RELEASE_TABLET | Freq: Every day | ORAL | Status: DC
  Administered 2015-05-20 – 2015-05-23 (×4): 40 mg via ORAL
  Filled 2015-05-19 (×5): qty 1

## 2015-05-19 MED ORDER — HEPARIN SODIUM (PORCINE) 1000 UNIT/ML DIALYSIS: 1000.0000 [IU] | INTRAMUSCULAR | Status: DC | PRN

## 2015-05-19 MED ORDER — GABAPENTIN 100 MG PO CAPS
100.0000 mg | ORAL_CAPSULE | Freq: Three times a day (TID) | ORAL | Status: DC
  Administered 2015-05-19 – 2015-05-21 (×5): 100 mg via ORAL
  Filled 2015-05-19 (×5): qty 1

## 2015-05-19 MED ORDER — CALCIUM ACETATE (PHOS BINDER) 667 MG PO CAPS
667.0000 mg | ORAL_CAPSULE | Freq: Three times a day (TID) | ORAL | Status: DC
  Administered 2015-05-20 – 2015-05-23 (×7): 667 mg via ORAL
  Filled 2015-05-19 (×8): qty 1

## 2015-05-19 MED ORDER — SENNOSIDES-DOCUSATE SODIUM 8.6-50 MG PO TABS
1.0000 | ORAL_TABLET | Freq: Every evening | ORAL | Status: DC | PRN
Start: 2015-05-19 — End: 2015-05-23
  Administered 2015-05-23: 1 via ORAL
  Filled 2015-05-19 (×2): qty 1

## 2015-05-19 MED ORDER — ONDANSETRON HCL 4 MG PO TABS: 4.0000 mg | ORAL_TABLET | Freq: Four times a day (QID) | ORAL | Status: DC | PRN

## 2015-05-19 NOTE — Consult Note (Signed)
[facility] Renal Consultation Note    Indication for Consultation:  Management of ESRD/hemodialysis; anemia, hypertension/volume and secondary hyperparathyroidism PCP:  HPI: [name] is a 81 y.o. 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  He has chronic back pain and gout flars at times.He had missed HD Friday and is also noted to miss about one treatment a week on a regular basis and usually has early termination of the other treatments running about about 3 of his 3.5 hours or an average 6 of the 11 + hours per week.  Labs upon admission today showed a BUN of 121 and K 6.4 are reflective of of his nonadherence to the dialysis prescription.  He has little insight that underdialysis may be contributory to other problems.  According to Dr. Lily Lovings note the patient believes the length of his treatment is the cause of his problems and that 3 hr treatments are the solution/ The patient cannot tell me what caused him to fall this am. He denies pain at present. SOB or CP. He has significant trouble with word finding, though able to say hospital clearly and late repeated [facility] clearly after I told him the name of the hospital.  Dr. Carollee Massed admitted note states the patient said he fell due to his left knee pain and knee giving out while turning around with his walker.   Past Medical History  Diagnosis Date  . Hypertension   . Diabetes mellitus without complication   . Arthritis   . History of blood transfusion   . GI bleed   . ESRD (end stage renal disease)   . Seizure   . Dialysis patient    Past Surgical History  Procedure Laterality Date  . Esophagogastroduodenoscopy  10/03/2012    Procedure: ESOPHAGOGASTRODUODENOSCOPY (EGD);  Surgeon: Theda Belfast, MD;  Location: [facility] ENDOSCOPY;  Service: Endoscopy;  Laterality: N/A;  .  Colonoscopy  10/04/2012    Procedure: COLONOSCOPY;  Surgeon: Theda Belfast, MD;  Location: [facility] ENDOSCOPY;  Service: Endoscopy;  Laterality: N/A;  . Colonoscopy with esophagogastroduodenoscopy (egd) Left 11/30/2012    Procedure: COLONOSCOPY WITH ESOPHAGOGASTRODUODENOSCOPY (EGD);  Surgeon: Theda Belfast, MD;  Location: [facility] ENDOSCOPY;  Service: Endoscopy;  Laterality: Left;  . Spine surgery  2004    back fusion/ [facility] Penumalli,MD  . Retinal laser procedure Right 04/2015   Family History  Problem Relation Age of Onset  . Sudden death Mother   . Sudden death Father    Social History:  reports that he has never smoked. He has never used smokeless tobacco. He reports that he does not drink alcohol or use illicit drugs. Allergies  Allergen Reactions  . Aspirin Other (See Comments)    Bleeding   . Lactose Intolerance (Gi) Diarrhea   Prior to Admission medications   Medication Sig Start Date End Date Taking? Authorizing Provider  amLODipine (NORVASC) 10 MG tablet Take 10 mg by mouth Daily.  08/15/12  Yes Historical Provider, MD  BESIVANCE 0.6 % SUSP Place 1 drop into the right eye 4 (four) times daily as needed. For 2 days after each monthly injection 05/08/15  Yes Historical Provider, MD  calcium acetate (PHOSLO) 667 MG capsule Take 2,001 mg by mouth 3 (three) times daily.  07/04/14  Yes Historical Provider, MD  colchicine 0.6 MG tablet as needed.  02/14/14  Yes Historical Provider, MD  cyclobenzaprine (FLEXERIL) 5 MG tablet Take 5 mg by mouth daily as needed. 07/01/14  Yes Historical Provider, MD  darbepoetin (ARANESP) 60 MCG/0.3ML SOLN injection Inject 0.3 mLs (60 mcg total) into the vein every Thursday with hemodialysis. Patient taking differently: Inject 60 mcg into the vein every 7 (seven) days.  09/29/13  Yes Joseph Art, DO  fexofenadine (ALLEGRA) 180 MG tablet Take 180 mg by mouth daily. As needed   Yes Historical Provider, MD  folic acid (FOLVITE) 1 MG tablet Take 1 tablet (1 mg total) by  mouth daily. 09/29/13  Yes Joseph Art, DO  gabapentin (NEURONTIN) 100 MG capsule Take 1 capsule (100 mg total) by mouth 3 (three) times daily. 04/07/15  Yes [name] L [name], DO  HUMALOG KWIKPEN 100 UNIT/ML KiwkPen Sliding Scale 04/16/15  Yes Historical Provider, MD  lacosamide (VIMPAT) 50 MG TABS tablet Take 1 tablet (50 mg total) by mouth 2 (two) times daily. 04/17/15  Yes Suanne Marker, MD  multivitamin (RENA-VIT) TABS tablet Take 1 tablet by mouth at bedtime. 09/29/13  Yes Jessica U Vann, DO  senna-docusate (SENOKOT-S) 8.6-50 MG per tablet Take 1 tablet by mouth. As needed   Yes Historical Provider, MD  triamcinolone (NASACORT ALLERGY 24HR) 55 MCG/ACT AERO nasal inhaler Place 1 spray into the nose. One spray nasal as needed for allery   Yes Historical Provider, MD  ACCU-CHEK AVIVA PLUS test strip Check blood sugar three times daily 04/20/14   Historical Provider, MD  doxercalciferol (HECTOROL) 4 MCG/2ML injection Inject 2 mLs (4 mcg total) into the vein Every Tuesday,Thursday,and Saturday with dialysis. Patient taking differently: Inject 4 mcg into the vein every Monday, Wednesday, and Friday with hemodialysis.  09/29/13   Joseph Art, DO  sodium chloride 0.9 % SOLN 100 mL with ferric gluconate 12.5 MG/ML SOLN 62.5 mg Inject 62.5 mg into the vein every Thursday with hemodialysis. 09/29/13   Joseph Art, DO  Tdap (BOOSTRIX) 5-2.5-18.5 LF-MCG/0.5 injection Inject 0.5 mLs into the muscle once. 05/15/15   [name] L [name], DO  zoster vaccine live, PF, (ZOSTAVAX) 22025 UNT/0.65ML injection Inject 19,400 Units into the skin once. 05/15/15   [name] L [name], DO   Current Facility-Administered Medications  Medication Dose Route Frequency Provider Last Rate Last Dose  . [START ON 05/21/2015] calcitRIOL (ROCALTROL) capsule 1 mcg  1 mcg Oral Q M,W,F-HD Weston Settle, PA-C       Current Outpatient Prescriptions  Medication Sig Dispense Refill  . amLODipine (NORVASC) 10 MG tablet Take 10 mg by mouth Daily.      Marland Kitchen BESIVANCE 0.6 % SUSP Place 1 drop into the right eye 4 (four) times daily as needed. For 2 days after each monthly injection  1  . calcium acetate (PHOSLO) 667 MG capsule Take 2,001 mg by mouth 3 (three) times daily.   0  . colchicine 0.6 MG tablet as needed.     . cyclobenzaprine (FLEXERIL) 5 MG tablet Take 5 mg by mouth daily as needed.  0  . darbepoetin (ARANESP) 60 MCG/0.3ML SOLN injection Inject 0.3 mLs (60 mcg total) into the vein every Thursday with hemodialysis. (Patient taking differently: Inject 60 mcg into the vein every 7 (seven) days. ) 4.2 mL   . fexofenadine (ALLEGRA) 180 MG tablet Take 180 mg by mouth daily. As needed    . folic acid (FOLVITE) 1 MG tablet Take 1 tablet (1 mg total) by mouth daily. 30 tablet 0  .  gabapentin (NEURONTIN) 100 MG capsule Take 1 capsule (100 mg total) by mouth 3 (three) times daily. 90 capsule 3  . HUMALOG KWIKPEN 100 UNIT/ML KiwkPen Sliding Scale  0  . lacosamide (VIMPAT) 50 MG TABS tablet Take 1 tablet (50 mg total) by mouth 2 (two) times daily. 180 tablet 4  . multivitamin (RENA-VIT) TABS tablet Take 1 tablet by mouth at bedtime. 30 tablet 0  . senna-docusate (SENOKOT-S) 8.6-50 MG per tablet Take 1 tablet by mouth. As needed    . triamcinolone (NASACORT ALLERGY 24HR) 55 MCG/ACT AERO nasal inhaler Place 1 spray into the nose. One spray nasal as needed for allery    . ACCU-CHEK AVIVA PLUS test strip Check blood sugar three times daily    . doxercalciferol (HECTOROL) 4 MCG/2ML injection Inject 2 mLs (4 mcg total) into the vein Every Tuesday,Thursday,and Saturday with dialysis. (Patient taking differently: Inject 4 mcg into the vein every Monday, Wednesday, and Friday with hemodialysis. ) 2 mL   . sodium chloride 0.9 % SOLN 100 mL with ferric gluconate 12.5 MG/ML SOLN 62.5 mg Inject 62.5 mg into the vein every Thursday with hemodialysis.    . Tdap (BOOSTRIX) 5-2.5-18.5 LF-MCG/0.5 injection Inject 0.5 mLs into the muscle once. 0.5 mL 0  . zoster vaccine  live, PF, (ZOSTAVAX) 86578 UNT/0.65ML injection Inject 19,400 Units into the skin once. 1 each 0   Labs: Basic Metabolic Panel:  Recent Labs Lab 05/13/15 0925 05/19/15 0920  NA 141 140  K 5.1 6.4*  CL 97 104  CO2 25 19*  GLUCOSE 127* 126*  BUN 56* 121*  CREATININE 7.16* 11.43*  CALCIUM 9.6 9.8     Recent Labs Lab 05/13/15 0925 05/19/15 0920  WBC 3.3* 4.1  NEUTROABS 1.6 3.0  HGB  --  12.1*  HCT 37.9 36.2*  MCV  --  102.8*  PLT  --  96*  Studies/Results: Ct Head Wo Contrast  05/19/2015   CLINICAL DATA:  81 year old male with head and neck injury following acute fall. Headache and cervical spine pain.  EXAM: CT HEAD WITHOUT CONTRAST  CT CERVICAL SPINE WITHOUT CONTRAST  TECHNIQUE: Multidetector CT imaging of the head and cervical spine was performed following the standard protocol without intravenous contrast. Multiplanar CT image reconstructions of the cervical spine were also generated.  COMPARISON:  10/01/2013 and prior CTs.  10/06/2013 MR.  FINDINGS: CT HEAD FINDINGS  An 8 mm left temporoparietal subdural hematoma and a 4 mm upper left parafalcine subdural hematoma are identified.  There is no evidence of midline shift, acute infarction, intraparenchymal hemorrhage, hydrocephalus or mass lesion.  Atrophy and chronic small-vessel white matter ischemic changes are again identified.  No acute bony abnormalities are noted.  A tiny amount of fluid in the right maxillary sinus is identified.  CT CERVICAL SPINE FINDINGS  Reversal of the normal cervical lordosis is noted.  There is no evidence acute fracture, subluxation or prevertebral soft tissue swelling.  Moderate -severe degenerative disc disease/spondylosis from C3-C7 noted causing mild -moderate central spinal and bony foraminal narrowing at several levels.  No focal bony lesions are identified.  Soft tissue structures are unremarkable except for vascular calcifications.  The lung apices are clear.  IMPRESSION: 8 mm left  temporoparietal subdural hematoma and 4 mm left parafalcine subdural hematoma. No evidence of midline shift.  No static evidence of acute injury to the cervical spine. Moderate -severe degenerative changes as described.  Cerebral atrophy and chronic small-vessel white matter ischemic changes.  Critical Value/emergent results were  called by telephone at the time of interpretation on 05/19/2015 at 9:00 am to Dr. Gerhard Munch , who verbally acknowledged these results.   Electronically Signed   By: Harmon Pier M.D.   On: 05/19/2015 09:00   Ct Cervical Spine Wo Contrast  05/19/2015   CLINICAL DATA:  81 year old male with head and neck injury following acute fall. Headache and cervical spine pain.  EXAM: CT HEAD WITHOUT CONTRAST  CT CERVICAL SPINE WITHOUT CONTRAST  TECHNIQUE: Multidetector CT imaging of the head and cervical spine was performed following the standard protocol without intravenous contrast. Multiplanar CT image reconstructions of the cervical spine were also generated.  COMPARISON:  10/01/2013 and prior CTs.  10/06/2013 MR.  FINDINGS: CT HEAD FINDINGS  An 8 mm left temporoparietal subdural hematoma and a 4 mm upper left parafalcine subdural hematoma are identified.  There is no evidence of midline shift, acute infarction, intraparenchymal hemorrhage, hydrocephalus or mass lesion.  Atrophy and chronic small-vessel white matter ischemic changes are again identified.  No acute bony abnormalities are noted.  A tiny amount of fluid in the right maxillary sinus is identified.  CT CERVICAL SPINE FINDINGS  Reversal of the normal cervical lordosis is noted.  There is no evidence acute fracture, subluxation or prevertebral soft tissue swelling.  Moderate -severe degenerative disc disease/spondylosis from C3-C7 noted causing mild -moderate central spinal and bony foraminal narrowing at several levels.  No focal bony lesions are identified.  Soft tissue structures are unremarkable except for vascular  calcifications.  The lung apices are clear.  IMPRESSION: 8 mm left temporoparietal subdural hematoma and 4 mm left parafalcine subdural hematoma. No evidence of midline shift.  No static evidence of acute injury to the cervical spine. Moderate -severe degenerative changes as described.  Cerebral atrophy and chronic small-vessel white matter ischemic changes.  Critical Value/emergent results were called by telephone at the time of interpretation on 05/19/2015 at 9:00 am to Dr. Gerhard Munch , who verbally acknowledged these results.   Electronically Signed   By: Harmon Pier M.D.   On: 05/19/2015 09:00    ROS: As per HPI otherwise negative. Physical Exam: Filed Vitals:   05/19/15 2505 05/19/15 0657 05/19/15 0706 05/19/15 1213  BP:   187/77 177/72  Pulse:   73 71  Temp:   97.6 F (36.4 C)   TempSrc:   Oral   Resp:   14 18  Height:  6' (1.829 m)    Weight:  95.709 kg (211 lb)    SpO2: 98%  96% 99%     General: Well developed, well nourished, elderly AA male, NAD on HD Head: Normocephalic, atraumatic, sclera non-icteric, mucus membranes are moist, some blood on blue pad. Neck: Supple. JVD not elevated. Lungs: Clear bilaterally to auscultation without wheezes, rales, or rhonchi. Breathing is unlabored. Heart: RRR with S1 S2.  Abdomen: Soft, non-tender, non-distended with normoactive bowel sounds. M-S:  Strength and tone appear normal for age. Lower extremities: without edema or ischemic changes, no open wounds  Neuro: Alert , oriented to Hospital. Cannot state name of hosp, city or year.  Moves all extremities spontaneously. Dialysis Access: right upper AVF Qb 400  Dialysis Orders: [facility] 3.75 hours 180 450 A 1.5 EDW 92.5 2 k 2 Ca right upper AVF no heparin  mircera 150 q 2 weeks ast given 8/31, venofer 100 through 9/16 Recent labs;  Hgb 11.8 9/7 iPTH 4.6 Ca/P ok  Assessment/Plan: 1. Subdural hematomas - related to fall; continue no heparin  HD (had been on no heparin already due  to hx GIB from AVM); have trouble with word finding on HD today; this is new - for repeat CT in am 2. Hyperkalemia/metobolic acidosis due to underdialysis from missed treatments (9/9) and failure to run full treatments 3. ESRD -  MWF HD today - use 1 K bath, BUN 121 today - he has to have some degree of uremia! 4. Hypertension/volume  - getting below EDW, BP comes down during treatments 5. Anemia  - Hgb 12 - hold ESA - continue Fe repletion 6. Metabolic bone disease -  Continue calcitriol 7. Nutrition -renal diet/vits 8. Thrombocytopenia - follow trend 9. Seizure disorder -  10. Gout - on colchicine 11. DM  - per primary 12. Dialysis noncompliance- has no insight into this.  Will see if he does better staying on treatments while here.  [name], PA-C [facility] Beeper [phone] 05/19/2015, 1:44 PM   Renal Attending: He has a very small SDH identified by CT after a fall.  There is no mass effect.  He is severely azotemic due to non-adherence with dialysis.  I think he is uremic. Will dialyze without heparin and limit UF per rec of NS. Lorance Pickeral C

## 2015-05-19 NOTE — ED Notes (Signed)
Head laceration repaired, dressing applied. Pt tolerated without difficulty.

## 2015-05-19 NOTE — ED Notes (Signed)
Report given to hemodialysis nurse, pt to be transported for treatment.

## 2015-05-19 NOTE — Progress Notes (Signed)
Family at bedside; states he seems confused.

## 2015-05-19 NOTE — ED Notes (Signed)
EMS - Patient coming from home with c/o of fall from standing position while using his walker this morning.  The walker got caught in the door causing him to tip over.  Denies LOC, chest pain, syncope.  Patient has 1/2" laceration and hematoma to the right occipital with moderate amount of bleeding.  Denies neck pain, crepitus.  Patient is in a c-collar for patient safety.  Dialysis every Monday, Wednesday and Friday.  GCS 15.

## 2015-05-19 NOTE — ED Notes (Signed)
Patient physically moved to room C26 from D31 with visitors in tow; [place], RN placed patient on monitor; patient resting, no needs at this time

## 2015-05-19 NOTE — Procedures (Signed)
Tol HD. Some drop in BP.  Will reduce goal as advised by NS and limited by BP. Merrel Crabbe C

## 2015-05-19 NOTE — ED Provider Notes (Signed)
CSN: [id number]     Arrival date & time 05/19/15  2542 History   First MD Initiated Contact with Patient 05/19/15 0703     Chief Complaint  Patient presents with  . Fall    From standing to the floor     (Consider location/radiation/quality/duration/timing/severity/associated sxs/prior Treatment) HPI Patient presents after a fall with pain in his posterior head, neck. Patient was well prior to the event, describes the event in its entirety, has no persistent nausea, lightheadedness, visual changes. He recalls falling against the wall, while attempting to stand just prior to ED arrival. He fell, striking his head against the wall. No other substantial injuries, no other complaints. Patient has multiple medical issues, including end-stage renal disease, requiring dialysis. Pain is focally in the R parietal  / occipital region, non-radiating, sore - with active bleeding Past Medical History  Diagnosis Date  . Hypertension   . Diabetes mellitus without complication   . Arthritis   . History of blood transfusion   . GI bleed   . ESRD (end stage renal disease)   . Seizure   . Dialysis patient    Past Surgical History  Procedure Laterality Date  . Esophagogastroduodenoscopy  10/03/2012    Procedure: ESOPHAGOGASTRODUODENOSCOPY (EGD);  Surgeon: Theda Belfast, MD;  Location: [facility] ENDOSCOPY;  Service: Endoscopy;  Laterality: N/A;  . Colonoscopy  10/04/2012    Procedure: COLONOSCOPY;  Surgeon: Theda Belfast, MD;  Location: [facility] ENDOSCOPY;  Service: Endoscopy;  Laterality: N/A;  . Colonoscopy with esophagogastroduodenoscopy (egd) Left 11/30/2012    Procedure: COLONOSCOPY WITH ESOPHAGOGASTRODUODENOSCOPY (EGD);  Surgeon: Theda Belfast, MD;  Location: [facility] ENDOSCOPY;  Service: Endoscopy;  Laterality: Left;  . Spine surgery  2004    back fusion/ [facility] Penumalli,MD  . Retinal laser procedure Right 04/2015   Family History  Problem Relation Age of Onset  . Sudden death Mother   . Sudden death  Father    Social History  Substance Use Topics  . Smoking status: Never Smoker   . Smokeless tobacco: Never Used  . Alcohol Use: No    Review of Systems  Constitutional:       Per HPI, otherwise negative  HENT:       Per HPI, otherwise negative  Respiratory:       Per HPI, otherwise negative  Cardiovascular:       Per HPI, otherwise negative  Gastrointestinal: Negative for vomiting.  Endocrine:       Negative aside from HPI  Genitourinary:       Neg aside from HPI   Musculoskeletal:       Per HPI, otherwise negative  Skin: Negative.   Neurological: Negative for syncope.      Allergies  Aspirin and Lactose intolerance (gi)  Home Medications   Prior to Admission medications   Medication Sig Start Date End Date Taking? Authorizing Provider  amLODipine (NORVASC) 10 MG tablet Take 10 mg by mouth Daily.  08/15/12  Yes Historical Provider, MD  calcium acetate (PHOSLO) 667 MG capsule Take 2,001 mg by mouth 3 (three) times daily.  07/04/14  Yes Historical Provider, MD  colchicine 0.6 MG tablet as needed.  02/14/14  Yes Historical Provider, MD  cyclobenzaprine (FLEXERIL) 5 MG tablet Take 5 mg by mouth daily as needed. 07/01/14  Yes Historical Provider, MD  fexofenadine (ALLEGRA) 180 MG tablet Take 180 mg by mouth daily. As needed   Yes Historical Provider, MD  folic acid (FOLVITE) 1 MG tablet Take 1 tablet (  1 mg total) by mouth daily. 09/29/13  Yes Joseph Art, DO  gabapentin (NEURONTIN) 100 MG capsule Take 1 capsule (100 mg total) by mouth 3 (three) times daily. 04/07/15  Yes [name] L [name], DO  HUMALOG KWIKPEN 100 UNIT/ML KiwkPen Sliding Scale 04/16/15  Yes Historical Provider, MD  lacosamide (VIMPAT) 50 MG TABS tablet Take 1 tablet (50 mg total) by mouth 2 (two) times daily. 04/17/15  Yes Suanne Marker, MD  multivitamin (RENA-VIT) TABS tablet Take 1 tablet by mouth at bedtime. 09/29/13  Yes Jessica U Vann, DO  senna-docusate (SENOKOT-S) 8.6-50 MG per tablet Take 1 tablet by  mouth. As needed   Yes Historical Provider, MD  triamcinolone (NASACORT ALLERGY 24HR) 55 MCG/ACT AERO nasal inhaler Place 1 spray into the nose. One spray nasal as needed for allery   Yes Historical Provider, MD  ACCU-CHEK AVIVA PLUS test strip Check blood sugar three times daily 04/20/14   Historical Provider, MD  acetaminophen (TYLENOL) 500 MG tablet Take 500 mg by mouth. Take as needed    Historical Provider, MD  darbepoetin (ARANESP) 60 MCG/0.3ML SOLN injection Inject 0.3 mLs (60 mcg total) into the vein every Thursday with hemodialysis. Patient taking differently: Inject 60 mcg into the vein every 7 (seven) days.  09/29/13   Joseph Art, DO  doxercalciferol (HECTOROL) 4 MCG/2ML injection Inject 2 mLs (4 mcg total) into the vein Every Tuesday,Thursday,and Saturday with dialysis. Patient taking differently: Inject 4 mcg into the vein every Monday, Wednesday, and Friday with hemodialysis.  09/29/13   Joseph Art, DO  insulin lispro (HUMALOG) 100 UNIT/ML cartridge Inject into the skin. Sliding scale    Historical Provider, MD  sodium chloride 0.9 % SOLN 100 mL with ferric gluconate 12.5 MG/ML SOLN 62.5 mg Inject 62.5 mg into the vein every Thursday with hemodialysis. 09/29/13   Joseph Art, DO  Tdap (BOOSTRIX) 5-2.5-18.5 LF-MCG/0.5 injection Inject 0.5 mLs into the muscle once. 05/15/15   [name] L [name], DO  zoster vaccine live, PF, (ZOSTAVAX) 96045 UNT/0.65ML injection Inject 19,400 Units into the skin once. 05/15/15   [name] L [name], DO   BP 187/77 mmHg  Pulse 73  Temp(Src) 97.6 F (36.4 C) (Oral)  Resp 14  Ht 6' (1.829 m)  Wt 211 lb (95.709 kg)  BMI 28.61 kg/m2  SpO2 96% Physical Exam  Constitutional: He is oriented to person, place, and time. He appears well-developed. No distress. Cervical collar in place.  HENT:  Head: Normocephalic and atraumatic.    Eyes: Conjunctivae and EOM are normal.  Cardiovascular: Normal rate and regular rhythm.   Pulmonary/Chest: Effort normal. No  stridor. No respiratory distress.  Abdominal: He exhibits no distension.  Musculoskeletal: He exhibits no edema.  Neurological: He is alert and oriented to person, place, and time. He displays no atrophy and no tremor. No cranial nerve deficit or sensory deficit. He exhibits normal muscle tone. He displays no seizure activity. Coordination normal.  Skin: Skin is warm and dry.  Psychiatric: He has a normal mood and affect.  Nursing note and vitals reviewed.   ED Course  Procedures (including critical care time) Labs Review Labs Reviewed  BASIC METABOLIC PANEL - Abnormal; Notable for the following:    Potassium 6.4 (*)    CO2 19 (*)    Glucose, Bld 126 (*)    BUN 121 (*)    Creatinine, Ser 11.43 (*)    GFR calc non Af Amer 4 (*)    GFR calc Af  Amer 4 (*)    Anion gap 17 (*)    All other components within normal limits  CBC WITH DIFFERENTIAL/PLATELET - Abnormal; Notable for the following:    RBC 3.52 (*)    Hemoglobin 12.1 (*)    HCT 36.2 (*)    MCV 102.8 (*)    MCH 34.4 (*)    RDW 16.5 (*)    Platelets 96 (*)    All other components within normal limits  APTT    Imaging Review Ct Head Wo Contrast  05/19/2015   CLINICAL DATA:  81 year old male with head and neck injury following acute fall. Headache and cervical spine pain.  EXAM: CT HEAD WITHOUT CONTRAST  CT CERVICAL SPINE WITHOUT CONTRAST  TECHNIQUE: Multidetector CT imaging of the head and cervical spine was performed following the standard protocol without intravenous contrast. Multiplanar CT image reconstructions of the cervical spine were also generated.  COMPARISON:  10/01/2013 and prior CTs.  10/06/2013 MR.  FINDINGS: CT HEAD FINDINGS  An 8 mm left temporoparietal subdural hematoma and a 4 mm upper left parafalcine subdural hematoma are identified.  There is no evidence of midline shift, acute infarction, intraparenchymal hemorrhage, hydrocephalus or mass lesion.  Atrophy and chronic small-vessel white matter ischemic  changes are again identified.  No acute bony abnormalities are noted.  A tiny amount of fluid in the right maxillary sinus is identified.  CT CERVICAL SPINE FINDINGS  Reversal of the normal cervical lordosis is noted.  There is no evidence acute fracture, subluxation or prevertebral soft tissue swelling.  Moderate -severe degenerative disc disease/spondylosis from C3-C7 noted causing mild -moderate central spinal and bony foraminal narrowing at several levels.  No focal bony lesions are identified.  Soft tissue structures are unremarkable except for vascular calcifications.  The lung apices are clear.  IMPRESSION: 8 mm left temporoparietal subdural hematoma and 4 mm left parafalcine subdural hematoma. No evidence of midline shift.  No static evidence of acute injury to the cervical spine. Moderate -severe degenerative changes as described.  Cerebral atrophy and chronic small-vessel white matter ischemic changes.  Critical Value/emergent results were called by telephone at the time of interpretation on 05/19/2015 at 9:00 am to Dr. Gerhard Munch , who verbally acknowledged these results.   Electronically Signed   By: Harmon Pier M.D.   On: 05/19/2015 09:00   Ct Cervical Spine Wo Contrast  05/19/2015   CLINICAL DATA:  81 year old male with head and neck injury following acute fall. Headache and cervical spine pain.  EXAM: CT HEAD WITHOUT CONTRAST  CT CERVICAL SPINE WITHOUT CONTRAST  TECHNIQUE: Multidetector CT imaging of the head and cervical spine was performed following the standard protocol without intravenous contrast. Multiplanar CT image reconstructions of the cervical spine were also generated.  COMPARISON:  10/01/2013 and prior CTs.  10/06/2013 MR.  FINDINGS: CT HEAD FINDINGS  An 8 mm left temporoparietal subdural hematoma and a 4 mm upper left parafalcine subdural hematoma are identified.  There is no evidence of midline shift, acute infarction, intraparenchymal hemorrhage, hydrocephalus or mass lesion.   Atrophy and chronic small-vessel white matter ischemic changes are again identified.  No acute bony abnormalities are noted.  A tiny amount of fluid in the right maxillary sinus is identified.  CT CERVICAL SPINE FINDINGS  Reversal of the normal cervical lordosis is noted.  There is no evidence acute fracture, subluxation or prevertebral soft tissue swelling.  Moderate -severe degenerative disc disease/spondylosis from C3-C7 noted causing mild -moderate central spinal and bony foraminal  narrowing at several levels.  No focal bony lesions are identified.  Soft tissue structures are unremarkable except for vascular calcifications.  The lung apices are clear.  IMPRESSION: 8 mm left temporoparietal subdural hematoma and 4 mm left parafalcine subdural hematoma. No evidence of midline shift.  No static evidence of acute injury to the cervical spine. Moderate -severe degenerative changes as described.  Cerebral atrophy and chronic small-vessel white matter ischemic changes.  Critical Value/emergent results were called by telephone at the time of interpretation on 05/19/2015 at 9:00 am to Dr. Gerhard Munch , who verbally acknowledged these results.   Electronically Signed   By: Harmon Pier M.D.   On: 05/19/2015 09:00   I have personally reviewed and evaluated these images and lab results as part of my medical decision-making.  I discussed the patient's radiographic findings with radiology, as I reviewed the films.  On repeat exam the patient is in similar condition, though he appears somewhat more confused than on initial evaluation. I discussed all findings, and demonstrated on CT to patient and his wife and the patient.   O2- 99%ra, nml  I have discussed patient's case with nephrology, who will facilitate dialysis today.   I have evaluated the patient with our neurosurgical colleagues.  LACERATION REPAIR Performed by: Gerhard Munch Authorized by: Gerhard Munch Consent: Verbal consent  obtained. Risks and benefits: risks, benefits and alternatives were discussed Consent given by: patient Patient identity confirmed: provided demographic data Prepped and Draped in normal sterile fashion Wound explored  Laceration Location: R occipital scalp  Laceration Length: 4cm  No Foreign Bodies seen or palpated  Anesthesia: local infiltration  Local anesthetic: lidocaine 1% no epinephrine  Anesthetic total: 3 ml  Irrigation method: syringe Amount of cleaning: standard  Skin closure: 5-0 suture  Number of sutures: 2  Technique: close  Patient tolerance: Patient tolerated the procedure well with no immediate complications.   MDM  Elderly male on dialysis presents after mechanical fall. Patient is somewhat confused, repetitive speech, but has no other localizing neurologic deficits. Patient is found to have intracranial hemorrhage, with both subdural hematoma, and parafalcine bleed. Patient's case was quoted with our neurosurgical colleagues, nephrology, given the patient's hyperkalemia, need for dialysis today, he was admitted to a stepdown unit.  CRITICAL CARE Performed by: Gerhard Munch Total critical care time: 40 Critical care time was exclusive of separately billable procedures and treating other patients. Critical care was necessary to treat or prevent imminent or life-threatening deterioration. Critical care was time spent personally by me on the following activities: development of treatment plan with patient and/or surrogate as well as nursing, discussions with consultants, evaluation of patient's response to treatment, examination of patient, obtaining history from patient or surrogate, ordering and performing treatments and interventions, ordering and review of laboratory studies, ordering and review of radiographic studies, pulse oximetry and re-evaluation of patient's condition.   Gerhard Munch, MD 05/19/15 1139

## 2015-05-19 NOTE — Consult Note (Signed)
Reason for Consult: Headaches subdural hematoma Referring Physician: Emergency department Dr. Angela Nevin Topham is an 81 y.o. male.  HPI: 81 year old gentleman who was in his normal state of health with a history of end-stage renal disease and diabetes ambulating with a walker secondary to left knee pain was going around a turn and tripped and fell landing in the back of his head denies any loss of consciousness the family states that his status is been unchanged since the fall. Currently the patient is complaining of headache no nausea and no numbness and tingling in his arms or his legs. Currently he is having his laceration repair by Dr. Jeraldine Loots the emergency department.  Past Medical History  Diagnosis Date  . Hypertension   . Diabetes mellitus without complication   . Arthritis   . History of blood transfusion   . GI bleed   . ESRD (end stage renal disease)   . Seizure   . Dialysis patient     Past Surgical History  Procedure Laterality Date  . Esophagogastroduodenoscopy  10/03/2012    Procedure: ESOPHAGOGASTRODUODENOSCOPY (EGD);  Surgeon: Theda Belfast, MD;  Location: [facility] ENDOSCOPY;  Service: Endoscopy;  Laterality: N/A;  . Colonoscopy  10/04/2012    Procedure: COLONOSCOPY;  Surgeon: Theda Belfast, MD;  Location: [facility] ENDOSCOPY;  Service: Endoscopy;  Laterality: N/A;  . Colonoscopy with esophagogastroduodenoscopy (egd) Left 11/30/2012    Procedure: COLONOSCOPY WITH ESOPHAGOGASTRODUODENOSCOPY (EGD);  Surgeon: Theda Belfast, MD;  Location: [facility] ENDOSCOPY;  Service: Endoscopy;  Laterality: Left;  . Spine surgery  2004    back fusion/ [facility] Penumalli,MD  . Retinal laser procedure Right 04/2015    Family History  Problem Relation Age of Onset  . Sudden death Mother   . Sudden death Father     Social History:  reports that he has never smoked. He has never used smokeless tobacco. He reports that he does not drink alcohol or use illicit drugs.  Allergies:  Allergies  Allergen  Reactions  . Aspirin Other (See Comments)    Bleeding   . Lactose Intolerance (Gi) Diarrhea    Medications: I have reviewed the patient's current medications.  Results for orders placed or performed during the hospital encounter of 05/19/15 (from the past 48 hour(s))  CBC with Differential     Status: Abnormal (Preliminary result)   Collection Time: 05/19/15  9:20 AM  Result Value Ref Range   WBC 4.1 4.0 - 10.5 K/uL   RBC 3.52 (L) 4.22 - 5.81 MIL/uL   Hemoglobin 12.1 (L) 13.0 - 17.0 g/dL   HCT 03.5 (L) 00.9 - 38.1 %   MCV 102.8 (H) 78.0 - 100.0 fL   MCH 34.4 (H) 26.0 - 34.0 pg   MCHC 33.4 30.0 - 36.0 g/dL   RDW 82.9 (H) 93.7 - 16.9 %   Platelets PENDING 150 - 400 K/uL   Neutrophils Relative % 72 43 - 77 %   Neutro Abs 3.0 1.7 - 7.7 K/uL   Lymphocytes Relative 22 12 - 46 %   Lymphs Abs 0.9 0.7 - 4.0 K/uL   Monocytes Relative 4 3 - 12 %   Monocytes Absolute 0.2 0.1 - 1.0 K/uL   Eosinophils Relative 2 0 - 5 %   Eosinophils Absolute 0.1 0.0 - 0.7 K/uL   Basophils Relative 0 0 - 1 %   Basophils Absolute 0.0 0.0 - 0.1 K/uL  APTT     Status: None   Collection Time: 05/19/15  9:20 AM  Result Value Ref Range   aPTT 33 24 - 37 seconds    Ct Head Wo Contrast  05/19/2015   CLINICAL DATA:  81 year old male with head and neck injury following acute fall. Headache and cervical spine pain.  EXAM: CT HEAD WITHOUT CONTRAST  CT CERVICAL SPINE WITHOUT CONTRAST  TECHNIQUE: Multidetector CT imaging of the head and cervical spine was performed following the standard protocol without intravenous contrast. Multiplanar CT image reconstructions of the cervical spine were also generated.  COMPARISON:  10/01/2013 and prior CTs.  10/06/2013 MR.  FINDINGS: CT HEAD FINDINGS  An 8 mm left temporoparietal subdural hematoma and a 4 mm upper left parafalcine subdural hematoma are identified.  There is no evidence of midline shift, acute infarction, intraparenchymal hemorrhage, hydrocephalus or mass lesion.   Atrophy and chronic small-vessel white matter ischemic changes are again identified.  No acute bony abnormalities are noted.  A tiny amount of fluid in the right maxillary sinus is identified.  CT CERVICAL SPINE FINDINGS  Reversal of the normal cervical lordosis is noted.  There is no evidence acute fracture, subluxation or prevertebral soft tissue swelling.  Moderate -severe degenerative disc disease/spondylosis from C3-C7 noted causing mild -moderate central spinal and bony foraminal narrowing at several levels.  No focal bony lesions are identified.  Soft tissue structures are unremarkable except for vascular calcifications.  The lung apices are clear.  IMPRESSION: 8 mm left temporoparietal subdural hematoma and 4 mm left parafalcine subdural hematoma. No evidence of midline shift.  No static evidence of acute injury to the cervical spine. Moderate -severe degenerative changes as described.  Cerebral atrophy and chronic small-vessel white matter ischemic changes.  Critical Value/emergent results were called by telephone at the time of interpretation on 05/19/2015 at 9:00 am to Dr. Gerhard Munch , who verbally acknowledged these results.   Electronically Signed   By: Harmon Pier M.D.   On: 05/19/2015 09:00   Ct Cervical Spine Wo Contrast  05/19/2015   CLINICAL DATA:  81 year old male with head and neck injury following acute fall. Headache and cervical spine pain.  EXAM: CT HEAD WITHOUT CONTRAST  CT CERVICAL SPINE WITHOUT CONTRAST  TECHNIQUE: Multidetector CT imaging of the head and cervical spine was performed following the standard protocol without intravenous contrast. Multiplanar CT image reconstructions of the cervical spine were also generated.  COMPARISON:  10/01/2013 and prior CTs.  10/06/2013 MR.  FINDINGS: CT HEAD FINDINGS  An 8 mm left temporoparietal subdural hematoma and a 4 mm upper left parafalcine subdural hematoma are identified.  There is no evidence of midline shift, acute infarction,  intraparenchymal hemorrhage, hydrocephalus or mass lesion.  Atrophy and chronic small-vessel white matter ischemic changes are again identified.  No acute bony abnormalities are noted.  A tiny amount of fluid in the right maxillary sinus is identified.  CT CERVICAL SPINE FINDINGS  Reversal of the normal cervical lordosis is noted.  There is no evidence acute fracture, subluxation or prevertebral soft tissue swelling.  Moderate -severe degenerative disc disease/spondylosis from C3-C7 noted causing mild -moderate central spinal and bony foraminal narrowing at several levels.  No focal bony lesions are identified.  Soft tissue structures are unremarkable except for vascular calcifications.  The lung apices are clear.  IMPRESSION: 8 mm left temporoparietal subdural hematoma and 4 mm left parafalcine subdural hematoma. No evidence of midline shift.  No static evidence of acute injury to the cervical spine. Moderate -severe degenerative changes as described.  Cerebral atrophy and chronic small-vessel white matter  ischemic changes.  Critical Value/emergent results were called by telephone at the time of interpretation on 05/19/2015 at 9:00 am to Dr. Gerhard Munch , who verbally acknowledged these results.   Electronically Signed   By: Harmon Pier M.D.   On: 05/19/2015 09:00    Review of Systems  Constitutional: Negative.   Eyes: Negative.   Respiratory: Negative.   Cardiovascular: Negative.   Gastrointestinal: Negative.   Genitourinary: Negative.   Skin: Negative.   Neurological: Positive for headaches.  Psychiatric/Behavioral: Negative.    Blood pressure 187/77, pulse 73, temperature 97.6 F (36.4 C), temperature source Oral, resp. rate 14, height 6' (1.829 m), weight 95.709 kg (211 lb), SpO2 96 %. Physical Exam  Constitutional: He is oriented to person, place, and time. He appears well-developed and well-nourished.  Eyes: Pupils are equal, round, and reactive to light.  Neck: Normal range of motion.   Neurological: He is alert and oriented to person, place, and time. He has normal strength. GCS eye subscore is 4. GCS verbal subscore is 5. GCS motor subscore is 6.  Patient is awake alert oriented pupils are equal extraocular movements are intact cranial nerves are intact strength is 5 out of 5 in his upper and lower extremity is with no pronator drift. Laceration posterior scalp with repaired by emergency department  Skin: Skin is warm and dry.    Assessment/Plan: 81 year old with a small left-sided convexity subdural hematoma with minimal to no mass effect. Patient is on dialysis for end-stage renal disease and is diabetic I recommended we have a internal medicine evaluation and admission evaluate for need for dialysis today. Should he be dialyzed, I would prefer no anticoagulate skin and for dialysis. In addition I think care should be taken not to pull off too much fluid that could lead to relaxing the brain and expanding the subdural. Currently the subdural is being temp and not off but what little local mass effect has. Keep him relatively euvolemic I think it'll keep this and minimize the chances of this expanding. I will continue to follow in-house and recommended a CT scan of his head in the morning.  Brinly Maietta P 05/19/2015, 10:12 AM

## 2015-05-19 NOTE — ED Notes (Signed)
Report given to [place], Charity fundraiser. Pt moved to C26.

## 2015-05-19 NOTE — Progress Notes (Signed)
Bed alarm in progress.

## 2015-05-19 NOTE — ED Notes (Signed)
Urinal given to patient to use; visitor at bedside

## 2015-05-19 NOTE — Progress Notes (Signed)
Received patient from [facility]; patient appears confused. Would not answer questions when spoken to; just stared off. In no acute distress. VSS.

## 2015-05-19 NOTE — H&P (Addendum)
[facility] History and Physical  Janlucas Muenchow EXB:[id number] DOB: 07-16-1932 DOA: 05/19/2015  Referring physician: Dr. Jeraldine Loots PCP: [name], DO   Chief Complaint: Fall  HPI: Cheyenne Janssen is a 81 y.o. male  With a history of hypertension, well-controlled diabetes mellitus type 2 with hemoglobin A1c of 5. 05/13/2015, end-stage renal disease on hemodialysis Monday Wednesday Friday at [facility] seizure disorder presenting to the ED after he sustained a fall secondary to left knee pain and left knee giving out as he was started to turn around with his walker on the way from his room to the bathroom and tripped over subsequently felt well and on the back of his head. Per family patient with no change in mental status with no significant change since fall. Patient denies any fevers, no chills, no nausea, no vomiting, no chest pain, no shortness of breath, no abdominal pain, no dysuria, no diarrhea, no constipation, no melena, no hematemesis, no hematochezia, no cough, no fever, no chills. Patient also complains of generalized weakness. No syncope. The patient was seen in the emergency room Patient was in the emergency room basic metabolic profile obtained had a potassium of 6.4 BUN of 121 and creatinine of 11.43 was also within normal limits. CBC obtained at a hemoglobin of 12.1 and a platelet count of 96 the rest was within normal limits.   Review of Systems: As history of present illness otherwise negative. Constitutional:  No weight loss, night sweats, Fevers, chills, fatigue.  HEENT:  No headaches, Difficulty swallowing,Tooth/dental problems,Sore throat,  No sneezing, itching, ear ache, nasal congestion, post nasal drip,  Cardio-vascular:  No chest pain, Orthopnea, PND, swelling in lower extremities, anasarca, dizziness, palpitations  GI:  No heartburn, indigestion, abdominal pain, nausea, vomiting, diarrhea, change in bowel habits, loss of appetite  Resp:  No  shortness of breath with exertion or at rest. No excess mucus, no productive cough, No non-productive cough, No coughing up of blood.No change in color of mucus.No wheezing.No chest wall deformity  Skin:  no rash or lesions.  GU:  no dysuria, change in color of urine, no urgency or frequency. No flank pain.  Musculoskeletal:  No joint pain or swelling. No decreased range of motion. No back pain.  Psych:  No change in mood or affect. No depression or anxiety. No memory loss.   Past Medical History  Diagnosis Date  . Hypertension   . Diabetes mellitus without complication   . Arthritis   . History of blood transfusion   . GI bleed   . ESRD (end stage renal disease)   . Seizure   . Dialysis patient    Past Surgical History  Procedure Laterality Date  . Esophagogastroduodenoscopy  10/03/2012    Procedure: ESOPHAGOGASTRODUODENOSCOPY (EGD);  Surgeon: Theda Belfast, MD;  Location: [facility] ENDOSCOPY;  Service: Endoscopy;  Laterality: N/A;  . Colonoscopy  10/04/2012    Procedure: COLONOSCOPY;  Surgeon: Theda Belfast, MD;  Location: [facility] ENDOSCOPY;  Service: Endoscopy;  Laterality: N/A;  . Colonoscopy with esophagogastroduodenoscopy (egd) Left 11/30/2012    Procedure: COLONOSCOPY WITH ESOPHAGOGASTRODUODENOSCOPY (EGD);  Surgeon: Theda Belfast, MD;  Location: [facility] ENDOSCOPY;  Service: Endoscopy;  Laterality: Left;  . Spine surgery  2004    back fusion/ [facility] Penumalli,MD  . Retinal laser procedure Right 04/2015   Social History:  reports that he has never smoked. He has never used smokeless tobacco. He reports that he does not drink alcohol or use illicit drugs.  Allergies  Allergen  Reactions  . Aspirin Other (See Comments)    Bleeding   . Lactose Intolerance (Gi) Diarrhea    Family History  Problem Relation Age of Onset  . Sudden death Mother   . Sudden death Father      Prior to Admission medications   Medication Sig Start Date End Date Taking? Authorizing Provider  ACCU-CHEK AVIVA PLUS  test strip Check blood sugar three times daily 04/20/14   Historical Provider, MD  acetaminophen (TYLENOL) 500 MG tablet Take 500 mg by mouth. Take as needed    Historical Provider, MD  amLODipine (NORVASC) 10 MG tablet Take 10 mg by mouth Daily.  08/15/12   Historical Provider, MD  calcium acetate (PHOSLO) 667 MG capsule Take 667 mg by mouth 3 (three) times daily. 07/04/14   Historical Provider, MD  colchicine 0.6 MG tablet as needed.  02/14/14   Historical Provider, MD  cyclobenzaprine (FLEXERIL) 5 MG tablet Take 5 mg by mouth daily as needed. 07/01/14   Historical Provider, MD  darbepoetin (ARANESP) 60 MCG/0.3ML SOLN injection Inject 0.3 mLs (60 mcg total) into the vein every Thursday with hemodialysis. Patient taking differently: Inject 60 mcg into the vein every 7 (seven) days.  09/29/13   Joseph Art, DO  doxercalciferol (HECTOROL) 4 MCG/2ML injection Inject 2 mLs (4 mcg total) into the vein Every Tuesday,Thursday,and Saturday with dialysis. Patient taking differently: Inject 4 mcg into the vein every Monday, Wednesday, and Friday with hemodialysis.  09/29/13   Joseph Art, DO  fexofenadine (ALLEGRA) 180 MG tablet Take 180 mg by mouth daily. As needed    Historical Provider, MD  folic acid (FOLVITE) 1 MG tablet Take 1 tablet (1 mg total) by mouth daily. 09/29/13   Joseph Art, DO  gabapentin (NEURONTIN) 100 MG capsule Take 1 capsule (100 mg total) by mouth 3 (three) times daily. 04/07/15   [name] L [name], DO  HUMALOG KWIKPEN 100 UNIT/ML KiwkPen Sliding Scale 04/16/15   Historical Provider, MD  insulin lispro (HUMALOG) 100 UNIT/ML cartridge Inject into the skin. Sliding scale    Historical Provider, MD  lacosamide (VIMPAT) 50 MG TABS tablet Take 1 tablet (50 mg total) by mouth 2 (two) times daily. 04/17/15   Suanne Marker, MD  multivitamin (RENA-VIT) TABS tablet Take 1 tablet by mouth at bedtime. 09/29/13   Joseph Art, DO  senna-docusate (SENOKOT-S) 8.6-50 MG per tablet Take 1 tablet by  mouth. As needed    Historical Provider, MD  sodium chloride 0.9 % SOLN 100 mL with ferric gluconate 12.5 MG/ML SOLN 62.5 mg Inject 62.5 mg into the vein every Thursday with hemodialysis. 09/29/13   Joseph Art, DO  Tdap (BOOSTRIX) 5-2.5-18.5 LF-MCG/0.5 injection Inject 0.5 mLs into the muscle once. 05/15/15   [name] L [name], DO  triamcinolone (NASACORT ALLERGY 24HR) 55 MCG/ACT AERO nasal inhaler Place 1 spray into the nose. One spray nasal as needed for allery    Historical Provider, MD  zoster vaccine live, PF, (ZOSTAVAX) 40981 UNT/0.65ML injection Inject 19,400 Units into the skin once. 05/15/15   Kermit Balo, DO   Physical Exam: Filed Vitals:   05/19/15 1914 05/19/15 0657 05/19/15 0706  BP:   187/77  Pulse:   73  Temp:   97.6 F (36.4 C)  TempSrc:   Oral  Resp:   14  Height:  6' (1.829 m)   Weight:  95.709 kg (211 lb)   SpO2: 98%  96%    Wt Readings from Last  3 Encounters:  05/19/15 95.709 kg (211 lb)  05/16/15 95.709 kg (211 lb)  04/17/15 94.167 kg (207 lb 9.6 oz)    General:  Well-developed well-nourished gentleman lying on gurney in no acute cardiopulmonary distress. Dry blood on the chest and posterior scalp. Dressing on posterior scalp. S/P Laceration Eyes: PERRLA, EOMI, normal lids, irises & conjunctiva ENT: grossly normal hearing, lips & tongue Neck: no LAD, masses or thyromegaly Cardiovascular: RRR, no m/r/g. No LE edema. Telemetry: SR, no arrhythmias  Respiratory: CTA bilaterally anterior lung fields, no w/r/r. Normal respiratory effort. Abdomen: soft, ntnd, positive bowel sounds, no rebound, no guarding Skin: no rash or induration seen on limited exam Musculoskeletal: grossly normal tone BUE/BLE Psychiatric: grossly normal mood and affect, speech fluent and appropriate Neurologic: Alert and oriented 3. Cranial nerves II through XII are grossly intact. Sensation is intact. Visual fields are intact. Gait not tested secondary to safety.           Labs on  Admission:  Basic Metabolic Panel:  Recent Labs Lab 05/13/15 0925 05/19/15 0920  NA 141 140  K 5.1 6.4*  CL 97 104  CO2 25 19*  GLUCOSE 127* 126*  BUN 56* 121*  CREATININE 7.16* 11.43*  CALCIUM 9.6 9.8   Liver Function Tests: No results for input(s): AST, ALT, ALKPHOS, BILITOT, PROT, ALBUMIN in the last 168 hours. No results for input(s): LIPASE, AMYLASE in the last 168 hours. No results for input(s): AMMONIA in the last 168 hours. CBC:  Recent Labs Lab 05/13/15 0925 05/19/15 0920  WBC 3.3* 4.1  NEUTROABS 1.6 3.0  HGB  --  12.1*  HCT 37.9 36.2*  MCV  --  102.8*  PLT  --  96*   Cardiac Enzymes: No results for input(s): CKTOTAL, CKMB, CKMBINDEX, TROPONINI in the last 168 hours.  BNP (last 3 results) No results for input(s): BNP in the last 8760 hours.  ProBNP (last 3 results) No results for input(s): PROBNP in the last 8760 hours.  CBG: No results for input(s): GLUCAP in the last 168 hours.  Radiological Exams on Admission: Ct Head Wo Contrast  05/19/2015   CLINICAL DATA:  81 year old male with head and neck injury following acute fall. Headache and cervical spine pain.  EXAM: CT HEAD WITHOUT CONTRAST  CT CERVICAL SPINE WITHOUT CONTRAST  TECHNIQUE: Multidetector CT imaging of the head and cervical spine was performed following the standard protocol without intravenous contrast. Multiplanar CT image reconstructions of the cervical spine were also generated.  COMPARISON:  10/01/2013 and prior CTs.  10/06/2013 MR.  FINDINGS: CT HEAD FINDINGS  An 8 mm left temporoparietal subdural hematoma and a 4 mm upper left parafalcine subdural hematoma are identified.  There is no evidence of midline shift, acute infarction, intraparenchymal hemorrhage, hydrocephalus or mass lesion.  Atrophy and chronic small-vessel white matter ischemic changes are again identified.  No acute bony abnormalities are noted.  A tiny amount of fluid in the right maxillary sinus is identified.  CT CERVICAL  SPINE FINDINGS  Reversal of the normal cervical lordosis is noted.  There is no evidence acute fracture, subluxation or prevertebral soft tissue swelling.  Moderate -severe degenerative disc disease/spondylosis from C3-C7 noted causing mild -moderate central spinal and bony foraminal narrowing at several levels.  No focal bony lesions are identified.  Soft tissue structures are unremarkable except for vascular calcifications.  The lung apices are clear.  IMPRESSION: 8 mm left temporoparietal subdural hematoma and 4 mm left parafalcine subdural hematoma. No evidence of midline  shift.  No static evidence of acute injury to the cervical spine. Moderate -severe degenerative changes as described.  Cerebral atrophy and chronic small-vessel white matter ischemic changes.  Critical Value/emergent results were called by telephone at the time of interpretation on 05/19/2015 at 9:00 am to Dr. Gerhard Munch , who verbally acknowledged these results.   Electronically Signed   By: Harmon Pier M.D.   On: 05/19/2015 09:00   Ct Cervical Spine Wo Contrast  05/19/2015   CLINICAL DATA:  81 year old male with head and neck injury following acute fall. Headache and cervical spine pain.  EXAM: CT HEAD WITHOUT CONTRAST  CT CERVICAL SPINE WITHOUT CONTRAST  TECHNIQUE: Multidetector CT imaging of the head and cervical spine was performed following the standard protocol without intravenous contrast. Multiplanar CT image reconstructions of the cervical spine were also generated.  COMPARISON:  10/01/2013 and prior CTs.  10/06/2013 MR.  FINDINGS: CT HEAD FINDINGS  An 8 mm left temporoparietal subdural hematoma and a 4 mm upper left parafalcine subdural hematoma are identified.  There is no evidence of midline shift, acute infarction, intraparenchymal hemorrhage, hydrocephalus or mass lesion.  Atrophy and chronic small-vessel white matter ischemic changes are again identified.  No acute bony abnormalities are noted.  A tiny amount of fluid in  the right maxillary sinus is identified.  CT CERVICAL SPINE FINDINGS  Reversal of the normal cervical lordosis is noted.  There is no evidence acute fracture, subluxation or prevertebral soft tissue swelling.  Moderate -severe degenerative disc disease/spondylosis from C3-C7 noted causing mild -moderate central spinal and bony foraminal narrowing at several levels.  No focal bony lesions are identified.  Soft tissue structures are unremarkable except for vascular calcifications.  The lung apices are clear.  IMPRESSION: 8 mm left temporoparietal subdural hematoma and 4 mm left parafalcine subdural hematoma. No evidence of midline shift.  No static evidence of acute injury to the cervical spine. Moderate -severe degenerative changes as described.  Cerebral atrophy and chronic small-vessel white matter ischemic changes.  Critical Value/emergent results were called by telephone at the time of interpretation on 05/19/2015 at 9:00 am to Dr. Gerhard Munch , who verbally acknowledged these results.   Electronically Signed   By: Harmon Pier M.D.   On: 05/19/2015 09:00    EKG: None  Assessment/Plan Principal Problem:   SDH (subdural hematoma) x 2 Active Problems:   Hypertension   history of AVM (arteriovenous malformation) of colon with hemorrhage   Metabolic acidosis, increased anion gap   Anemia in chronic kidney disease   Thrombocytopenia   Diabetes type 2, controlled   ESRD on dialysis   Secondary hyperparathyroidism, renal   Seizure disorder   Hyperkalemia   Knee pain, left   Laceration of scalp    #1 subdural hematoma 2 CT head. Secondary to mechanical fall. Patient with some slight confusion on his MRI was done states this is baseline. Will admit the patient to stepdown unit. Follow H&H. Neuro checks every 4 hours. Repeat CT head in the morning. Hold anticoagulation. Patient will be seen by neurosurgery who will be following along.  #2 hyperkalemia Likely secondary to chronic kidney  disease on hemodialysis. Nephrology will be consulted and patient likely for hemodialysis today.  #3 hypertension Stable. Continue Norvasc.  #4 end-stage renal disease on hemodialysis  will inform nephrology of admission. Patient has dialysis Monday Wednesday Fridays.  #5 history of seizures No noted seizures. Continue home regimen of vimpat. Outpatient follow-up.   #6 Fall Mechanical in nature.  PT/OT.  #7 metabolic acidosis Likely secondary to end-stage renal disease. Patient not on bicarbonate tablets. Defer to nephrology.  #8 thrombocytopenia chronic in nature Stable.  #9 Diabetes mellitus Check a hemoglobin A1c. Will place on sliding scale insulin.  10 posterior scalp laceration Status post sutures. EGD physician. Sutures to be removed in about 7-10 days.  #11 prophylaxis PPI for GI prophylaxis. SCDs for DVT prophylaxis.      Code Status: Full DVT Prophylaxis: SCDs Family Communication: The patient, son, wife at bedside. Disposition Plan: Admit to stepdown unit.  Time spent: 65 mins  [facility] MD [facility] Pager [phone]

## 2015-05-19 NOTE — ED Notes (Signed)
Pt transported to dialysis

## 2015-05-19 NOTE — ED Notes (Signed)
Pt reports he fell at home this morning while attempting to stand up and use walker. Pt reports walker accidentally got caught in door and caused him to fall to ground. Denies LOC. 0.5 inch laceration noted to occipital region, bleeding controlled at present. Pt is alert and oriented x4.

## 2015-05-19 NOTE — ED Notes (Signed)
Admitting physician at bedside. Pt remains alert and oriented x4. Vital signs stable. Pt denies pain at the time.

## 2015-05-20 ENCOUNTER — Encounter (HOSPITAL_COMMUNITY): Payer: Self-pay

## 2015-05-20 ENCOUNTER — Inpatient Hospital Stay (HOSPITAL_COMMUNITY): Payer: Medicare Other

## 2015-05-20 DIAGNOSIS — N186 End stage renal disease: Secondary | ICD-10-CM

## 2015-05-20 DIAGNOSIS — E875 Hyperkalemia: Secondary | ICD-10-CM

## 2015-05-20 DIAGNOSIS — D631 Anemia in chronic kidney disease: Secondary | ICD-10-CM

## 2015-05-20 DIAGNOSIS — I6200 Nontraumatic subdural hemorrhage, unspecified: Secondary | ICD-10-CM

## 2015-05-20 DIAGNOSIS — G40909 Epilepsy, unspecified, not intractable, without status epilepticus: Secondary | ICD-10-CM

## 2015-05-20 DIAGNOSIS — Q2733 Arteriovenous malformation of digestive system vessel: Secondary | ICD-10-CM

## 2015-05-20 DIAGNOSIS — Z992 Dependence on renal dialysis: Secondary | ICD-10-CM

## 2015-05-20 DIAGNOSIS — I10 Essential (primary) hypertension: Secondary | ICD-10-CM

## 2015-05-20 DIAGNOSIS — N189 Chronic kidney disease, unspecified: Secondary | ICD-10-CM

## 2015-05-20 DIAGNOSIS — E872 Acidosis: Secondary | ICD-10-CM

## 2015-05-20 DIAGNOSIS — S065X3S Traumatic subdural hemorrhage with loss of consciousness of 1 hour to 5 hours 59 minutes, sequela: Secondary | ICD-10-CM

## 2015-05-20 DIAGNOSIS — E119 Type 2 diabetes mellitus without complications: Secondary | ICD-10-CM

## 2015-05-20 DIAGNOSIS — S0101XA Laceration without foreign body of scalp, initial encounter: Secondary | ICD-10-CM

## 2015-05-20 DIAGNOSIS — N2581 Secondary hyperparathyroidism of renal origin: Secondary | ICD-10-CM

## 2015-05-20 DIAGNOSIS — D696 Thrombocytopenia, unspecified: Secondary | ICD-10-CM

## 2015-05-20 LAB — RENAL FUNCTION PANEL
ANION GAP: 15 (ref 5–15)
Albumin: 4.1 g/dL (ref 3.5–5.0)
BUN: 52 mg/dL — AB (ref 6–20)
CO2: 25 mmol/L (ref 22–32)
Calcium: 9 mg/dL (ref 8.9–10.3)
Chloride: 96 mmol/L — ABNORMAL LOW (ref 101–111)
Creatinine, Ser: 7.05 mg/dL — ABNORMAL HIGH (ref 0.61–1.24)
GFR calc Af Amer: 7 mL/min — ABNORMAL LOW (ref >60)
GFR calc non Af Amer: 6 mL/min — ABNORMAL LOW (ref >60)
GLUCOSE: 152 mg/dL — AB (ref 65–99)
POTASSIUM: 4.3 mmol/L (ref 3.5–5.1)
Phosphorus: 5.1 mg/dL — ABNORMAL HIGH (ref 2.5–4.6)
SODIUM: 136 mmol/L (ref 135–145)

## 2015-05-20 LAB — CBC
HCT: 35.7 % — ABNORMAL LOW (ref 39.0–52.0)
Hemoglobin: 11.8 g/dL — ABNORMAL LOW (ref 13.0–17.0)
MCH: 33.8 pg (ref 26.0–34.0)
MCHC: 33.1 g/dL (ref 30.0–36.0)
MCV: 102.3 fL — AB (ref 78.0–100.0)
PLATELETS: 74 10*3/uL — AB (ref 150–400)
RBC: 3.49 MIL/uL — AB (ref 4.22–5.81)
RDW: 16.1 % — ABNORMAL HIGH (ref 11.5–15.5)
WBC: 5.2 10*3/uL (ref 4.0–10.5)

## 2015-05-20 LAB — LIPID PANEL
CHOL/HDL RATIO: 2.1 ratio
CHOLESTEROL: 111 mg/dL (ref 0–200)
HDL: 53 mg/dL (ref >40)
LDL Cholesterol: 45 mg/dL (ref 0–99)
Triglycerides: 63 mg/dL (ref <150)
VLDL: 13 mg/dL (ref 0–40)

## 2015-05-20 LAB — TYPE AND SCREEN
ABO/RH(D): AB POS
ANTIBODY SCREEN: NEGATIVE

## 2015-05-20 MED ORDER — INSULIN ASPART 100 UNIT/ML ~~LOC~~ SOLN
0.0000 [IU] | SUBCUTANEOUS | Status: DC
Start: 2015-05-21 — End: 2015-05-23
  Administered 2015-05-21 (×2): 2 [IU] via SUBCUTANEOUS
  Administered 2015-05-21: 3 [IU] via SUBCUTANEOUS
  Administered 2015-05-22 (×2): 1 [IU] via SUBCUTANEOUS
  Administered 2015-05-22: 5 [IU] via SUBCUTANEOUS
  Administered 2015-05-22: 3 [IU] via SUBCUTANEOUS
  Administered 2015-05-22 – 2015-05-23 (×2): 2 [IU] via SUBCUTANEOUS
  Administered 2015-05-23: 5 [IU] via SUBCUTANEOUS

## 2015-05-20 MED ORDER — SODIUM CHLORIDE 0.9 % IV SOLN: Freq: Once | INTRAVENOUS | Status: DC

## 2015-05-20 NOTE — Progress Notes (Signed)
Patient ID: [name], male   DOB: 1932-03-13, 81 y.o.   MRN: [id number] Patient doing well no significant headache no nausea  Awake alert oriented strength 5 out of 5  CT scan stable with improving hemorrhage  Okay to transfer to the floor when deemed medically stable May discharged when medically cleared as well. Follow-up with neurosurgery in 2 weeks.

## 2015-05-20 NOTE — Consult Note (Signed)
Physical Medicine and Rehabilitation Consult  Reason for Consult: SDH Referring Physician: Dr. Joseph Art   HPI: [name] is a 81 y.o. 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 Patient's knee gave out while turning to enter the bathroom. He fell and struck his head. CT head done revealing 8 mm left temporoparietal SDH, left parafalcine SDH and cerebral atrophy with SVD.  Patient was noted to be severely azotemic and was taken to HD due to concerns of uremia secondary to missing his last dialysis session.  He was evaluated by Dr. Wynetta Emery who recommended conservative care and follow up CCT today shows left supratentorial SDH to more apparent but not significantly enlarged.  His mentation has improved but he continues to have decreased processing with lack of insight, poor safety awareness, left knee pain as well as balance deficits impacting mobility and self care.  CIR recommended by MD and rehab team for follow up therapy.     Review of Systems  Unable to perform ROS: mental acuity  Eyes: Negative for blurred vision.  Respiratory: Negative for shortness of breath.   Cardiovascular: Negative for chest pain.  Gastrointestinal: Negative for abdominal pain.  Musculoskeletal: Positive for myalgias and joint pain. Negative for back pain.      Past Medical History  Diagnosis Date  . Hypertension   . Diabetes mellitus without complication   . Arthritis   . History of blood transfusion   . GI bleed   . ESRD (end stage renal disease)   . Seizure   . Dialysis patient     Past Surgical History  Procedure Laterality Date  . Esophagogastroduodenoscopy  10/03/2012    Procedure: ESOPHAGOGASTRODUODENOSCOPY (EGD);  Surgeon: Theda Belfast, MD;  Location: [facility] ENDOSCOPY;  Service: Endoscopy;  Laterality: N/A;  . Colonoscopy  10/04/2012    Procedure:  COLONOSCOPY;  Surgeon: Theda Belfast, MD;  Location: [facility] ENDOSCOPY;  Service: Endoscopy;  Laterality: N/A;  . Colonoscopy with esophagogastroduodenoscopy (egd) Left 11/30/2012    Procedure: COLONOSCOPY WITH ESOPHAGOGASTRODUODENOSCOPY (EGD);  Surgeon: Theda Belfast, MD;  Location: [facility] ENDOSCOPY;  Service: Endoscopy;  Laterality: Left;  . Spine surgery  2004    back fusion/ [facility] Penumalli,MD  . Retinal laser procedure Right 04/2015    Family History  Problem Relation Age of Onset  . Sudden death Mother   . Sudden death Father     Social History:    Jennelle Human is supportive and drive him to HD.  Retired professor--PHD in Hotel manager. He reports that he has never smoked. He has never used smokeless tobacco. He reports that he does not drink alcohol or use illicit drugs.   Allergies  Allergen Reactions  . Aspirin Other (See Comments)    Bleeding   . Lactose Intolerance (Gi) Diarrhea    Medications Prior to Admission  Medication Sig Dispense Refill  . amLODipine (NORVASC) 10 MG tablet Take 10 mg by mouth Daily.     Marland Kitchen BESIVANCE 0.6 % SUSP Place 1 drop into the right eye 4 (four) times daily as needed. For 2 days after each monthly injection  1  . calcium acetate (PHOSLO) 667 MG capsule Take 2,001 mg by mouth 3 (three) times daily.   0  . colchicine 0.6 MG tablet as needed.     . cyclobenzaprine (FLEXERIL) 5 MG tablet Take 5  mg by mouth daily as needed.  0  . darbepoetin (ARANESP) 60 MCG/0.3ML SOLN injection Inject 0.3 mLs (60 mcg total) into the vein every Thursday with hemodialysis. (Patient taking differently: Inject 60 mcg into the vein every 7 (seven) days. ) 4.2 mL   . fexofenadine (ALLEGRA) 180 MG tablet Take 180 mg by mouth daily. As needed    . folic acid (FOLVITE) 1 MG tablet Take 1 tablet (1 mg total) by mouth daily. 30 tablet 0  . gabapentin (NEURONTIN) 100 MG capsule Take 1 capsule (100 mg total) by mouth 3 (three) times daily. 90 capsule 3  . HUMALOG KWIKPEN 100 UNIT/ML  KiwkPen Sliding Scale  0  . lacosamide (VIMPAT) 50 MG TABS tablet Take 1 tablet (50 mg total) by mouth 2 (two) times daily. 180 tablet 4  . multivitamin (RENA-VIT) TABS tablet Take 1 tablet by mouth at bedtime. 30 tablet 0  . senna-docusate (SENOKOT-S) 8.6-50 MG per tablet Take 1 tablet by mouth. As needed    . triamcinolone (NASACORT ALLERGY 24HR) 55 MCG/ACT AERO nasal inhaler Place 1 spray into the nose. One spray nasal as needed for allery    . ACCU-CHEK AVIVA PLUS test strip Check blood sugar three times daily    . doxercalciferol (HECTOROL) 4 MCG/2ML injection Inject 2 mLs (4 mcg total) into the vein Every Tuesday,Thursday,and Saturday with dialysis. (Patient taking differently: Inject 4 mcg into the vein every Monday, Wednesday, and Friday with hemodialysis. ) 2 mL   . sodium chloride 0.9 % SOLN 100 mL with ferric gluconate 12.5 MG/ML SOLN 62.5 mg Inject 62.5 mg into the vein every Thursday with hemodialysis.    . Tdap (BOOSTRIX) 5-2.5-18.5 LF-MCG/0.5 injection Inject 0.5 mLs into the muscle once. 0.5 mL 0  . zoster vaccine live, PF, (ZOSTAVAX) 09326 UNT/0.65ML injection Inject 19,400 Units into the skin once. 1 each 0     Home: Home Living Family/patient expects to be discharged to:: Inpatient rehab Living Arrangements: Spouse/significant other Additional Comments: Patient was an inconsistent historian and is completely disoriented. At one point he stated multiple times that he lives alone, another that he lives with his wife and that she takes him to dialysis. His common response to questions was "I just don't know."  Functional History: Prior Function Level of Independence:  (Unable to assess due to patient's response to questions.) Functional Status:  Mobility: Bed Mobility Overal bed mobility: Needs Assistance Bed Mobility: Supine to Sit Supine to sit: Min guard Transfers Overall transfer level: Needs assistance Equipment used: 2 person hand held assist Transfers: Sit to/from  Stand, Stand Pivot Transfers Sit to Stand: Min assist, +2 physical assistance Stand pivot transfers: Mod assist, +2 physical assistance General transfer comment: Required min A for full trunk extension and upright posture. Multiple VC's to not sit down and maintain upright posture. Ambulation/Gait General Gait Details: Deferred due to weakness and disorientation.    ADL: ADL Overall ADL's : Needs assistance/impaired Eating/Feeding: Supervision/ safety, Set up, Sitting Grooming: Supervision/safety, Set up, Sitting Upper Body Bathing: Supervision/ safety, Set up, Sitting Lower Body Bathing: Maximal assistance (with +2 min sit<>stand from bed) Upper Body Dressing : Moderate assistance, Sitting Lower Body Dressing: Total assistance (with +2 min sit<>stand from bed) Toilet Transfer: +2 for physical assistance, Moderate assistance, Stand-pivot (bed>recliner on his right) Toileting- Clothing Manipulation and Hygiene: Total assistance (with +2 min sit<>stand from bed)  Cognition: Cognition Overall Cognitive Status: No family/caregiver present to determine baseline cognitive functioning Orientation Level: Disoriented X4 Cognition Arousal/Alertness: Awake/alert  Behavior During Therapy: WFL for tasks assessed/performed Overall Cognitive Status: No family/caregiver present to determine baseline cognitive functioning Area of Impairment: Orientation, Memory, Following commands, Safety/judgement, Problem solving Orientation Level: Disoriented to, Place, Time, Situation Memory: Decreased short-term memory Following Commands: Follows one step commands with increased time Safety/Judgement: Decreased awareness of safety Problem Solving: Slow processing, Difficulty sequencing, Requires verbal cues, Requires tactile cues General Comments: Unable to orient patient to situation and place during session. Multiple attempts made throughout.   Blood pressure 147/64, pulse 74, temperature 97.8 F (36.6 C),  temperature source Oral, resp. rate 12, height 6' (1.829 m), weight 91.2 kg (201 lb 1 oz), SpO2 99 %. Physical Exam  Nursing note and vitals reviewed. Constitutional: He is oriented to person, place, and time. He appears well-developed and well-nourished.  HENT:  Head: Normocephalic.  Dry dressings on posterior scalp.  Eyes: Conjunctivae are normal. Pupils are equal, round, and reactive to light.  Neck: Normal range of motion. Neck supple.  Cardiovascular: Normal rate and regular rhythm.   Murmur heard. Respiratory: Effort normal and breath sounds normal. No respiratory distress. He has no wheezes.  GI: Soft. Bowel sounds are normal.  Musculoskeletal: He exhibits no edema or tenderness.  Arthritic changes left knee and some discomfort with knee ROM.  Neurological: He is alert and oriented to person, place, and time.  HOH in  left ear.  Speech clear. He breaks down with 2 step commands and was easily distracted. He was unable to recall recent holiday or year despite cues.  Perseverated  on September as being the year. Delayed processing. Told me he was from [place].  Moves all four but initiates more quickly with left. MMT inconsistent. RUE grossly 2/5 prox to 1-2 wrist,hand. RLE: 2-3/5 prox to distal. LUE and LLE grossly 3-4/5  Skin: Skin is warm and dry. No rash noted. No erythema.  Psychiatric: He has a normal mood and affect. His speech is normal. Cognition and memory are impaired. He expresses impulsivity. He is inattentive.    Results for orders placed or performed during the hospital encounter of 05/19/15 (from the past 24 hour(s))  MRSA PCR Screening     Status: None   Collection Time: 05/19/15  6:12 PM  Result Value Ref Range   MRSA by PCR NEGATIVE NEGATIVE  Magnesium     Status: None   Collection Time: 05/19/15  9:05 PM  Result Value Ref Range   Magnesium 2.2 1.7 - 2.4 mg/dL  Renal function panel     Status: Abnormal   Collection Time: 05/20/15  3:13 AM  Result Value Ref  Range   Sodium 136 135 - 145 mmol/L   Potassium 4.3 3.5 - 5.1 mmol/L   Chloride 96 (L) 101 - 111 mmol/L   CO2 25 22 - 32 mmol/L   Glucose, Bld 152 (H) 65 - 99 mg/dL   BUN 52 (H) 6 - 20 mg/dL   Creatinine, Ser 4.69 (H) 0.61 - 1.24 mg/dL   Calcium 9.0 8.9 - 62.9 mg/dL   Phosphorus 5.1 (H) 2.5 - 4.6 mg/dL   Albumin 4.1 3.5 - 5.0 g/dL   GFR calc non Af Amer 6 (L) >60 mL/min   GFR calc Af Amer 7 (L) >60 mL/min   Anion gap 15 5 - 15  CBC     Status: Abnormal   Collection Time: 05/20/15  3:13 AM  Result Value Ref Range   WBC 5.2 4.0 - 10.5 K/uL   RBC 3.49 (L) 4.22 - 5.81  MIL/uL   Hemoglobin 11.8 (L) 13.0 - 17.0 g/dL   HCT 85.4 (L) 62.7 - 03.5 %   MCV 102.3 (H) 78.0 - 100.0 fL   MCH 33.8 26.0 - 34.0 pg   MCHC 33.1 30.0 - 36.0 g/dL   RDW 00.9 (H) 38.1 - 82.9 %   Platelets 74 (L) 150 - 400 K/uL  Prepare Pheresed Platelets     Status: None (Preliminary result)   Collection Time: 05/20/15  1:18 PM  Result Value Ref Range   Unit Number [id number]    Blood Component Type PLTP LR1 PAS    Unit division 00    Status of Unit ALLOCATED    Transfusion Status OK TO TRANSFUSE    Ct Head Wo Contrast  05/20/2015   CLINICAL DATA:  Subdural hematoma.  EXAM: CT HEAD WITHOUT CONTRAST  TECHNIQUE: Contiguous axial images were obtained from the base of the skull through the vertex without intravenous contrast.  COMPARISON:  05/19/2015  FINDINGS: Again identified is 8 mm in maximum thickness left temporoparietal subdural hematoma. Blood products are again seen along the left side of falx cerebri. Subdural hematoma is now apparent along the left tentorium.  No evidence of a mass effect or midline shift. No evidence of acute infarction. Again seen are chronic small vessel white matter ischemic changes, and brain parenchymal atrophy. Basal ganglia calcifications also seen. Basal cisterns are preserved.  No depressed skull fractures. Small amount fluid within the right maxillary sinus. Visualized paranasal  sinuses and mastoid air cells are otherwise not opacified.  IMPRESSION: Not significantly enlarged, but rather more apparent left supratentorial subdural hematoma, tracking along the dural reflections. No evidence of midline shift.   Electronically Signed   By: Ted Mcalpine M.D.   On: 05/20/2015 12:52   Ct Head Wo Contrast  05/19/2015   CLINICAL DATA:  81 year old male with head and neck injury following acute fall. Headache and cervical spine pain.  EXAM: CT HEAD WITHOUT CONTRAST  CT CERVICAL SPINE WITHOUT CONTRAST  TECHNIQUE: Multidetector CT imaging of the head and cervical spine was performed following the standard protocol without intravenous contrast. Multiplanar CT image reconstructions of the cervical spine were also generated.  COMPARISON:  10/01/2013 and prior CTs.  10/06/2013 MR.  FINDINGS: CT HEAD FINDINGS  An 8 mm left temporoparietal subdural hematoma and a 4 mm upper left parafalcine subdural hematoma are identified.  There is no evidence of midline shift, acute infarction, intraparenchymal hemorrhage, hydrocephalus or mass lesion.  Atrophy and chronic small-vessel white matter ischemic changes are again identified.  No acute bony abnormalities are noted.  A tiny amount of fluid in the right maxillary sinus is identified.  CT CERVICAL SPINE FINDINGS  Reversal of the normal cervical lordosis is noted.  There is no evidence acute fracture, subluxation or prevertebral soft tissue swelling.  Moderate -severe degenerative disc disease/spondylosis from C3-C7 noted causing mild -moderate central spinal and bony foraminal narrowing at several levels.  No focal bony lesions are identified.  Soft tissue structures are unremarkable except for vascular calcifications.  The lung apices are clear.  IMPRESSION: 8 mm left temporoparietal subdural hematoma and 4 mm left parafalcine subdural hematoma. No evidence of midline shift.  No static evidence of acute injury to the cervical spine. Moderate -severe  degenerative changes as described.  Cerebral atrophy and chronic small-vessel white matter ischemic changes.  Critical Value/emergent results were called by telephone at the time of interpretation on 05/19/2015 at 9:00 am to Dr. Gerhard Munch , who  verbally acknowledged these results.   Electronically Signed   By: Harmon Pier M.D.   On: 05/19/2015 09:00   Ct Cervical Spine Wo Contrast  05/19/2015   CLINICAL DATA:  81 year old male with head and neck injury following acute fall. Headache and cervical spine pain.  EXAM: CT HEAD WITHOUT CONTRAST  CT CERVICAL SPINE WITHOUT CONTRAST  TECHNIQUE: Multidetector CT imaging of the head and cervical spine was performed following the standard protocol without intravenous contrast. Multiplanar CT image reconstructions of the cervical spine were also generated.  COMPARISON:  10/01/2013 and prior CTs.  10/06/2013 MR.  FINDINGS: CT HEAD FINDINGS  An 8 mm left temporoparietal subdural hematoma and a 4 mm upper left parafalcine subdural hematoma are identified.  There is no evidence of midline shift, acute infarction, intraparenchymal hemorrhage, hydrocephalus or mass lesion.  Atrophy and chronic small-vessel white matter ischemic changes are again identified.  No acute bony abnormalities are noted.  A tiny amount of fluid in the right maxillary sinus is identified.  CT CERVICAL SPINE FINDINGS  Reversal of the normal cervical lordosis is noted.  There is no evidence acute fracture, subluxation or prevertebral soft tissue swelling.  Moderate -severe degenerative disc disease/spondylosis from C3-C7 noted causing mild -moderate central spinal and bony foraminal narrowing at several levels.  No focal bony lesions are identified.  Soft tissue structures are unremarkable except for vascular calcifications.  The lung apices are clear.  IMPRESSION: 8 mm left temporoparietal subdural hematoma and 4 mm left parafalcine subdural hematoma. No evidence of midline shift.  No static evidence of  acute injury to the cervical spine. Moderate -severe degenerative changes as described.  Cerebral atrophy and chronic small-vessel white matter ischemic changes.  Critical Value/emergent results were called by telephone at the time of interpretation on 05/19/2015 at 9:00 am to Dr. Gerhard Munch , who verbally acknowledged these results.   Electronically Signed   By: Harmon Pier M.D.   On: 05/19/2015 09:00    Assessment/Plan: Diagnosis: left temporal-parietal SDH after fall.  1. Does the need for close, 24 hr/day medical supervision in concert with the patient's rehab needs make it unreasonable for this patient to be served in a less intensive setting? Yes 2. Co-Morbidities requiring supervision/potential complications: htn, dm, ESRD, sz disorder 3. Due to bladder management, bowel management, safety, skin/wound care, disease management, medication administration, pain management and patient education, does the patient require 24 hr/day rehab nursing? Yes 4. Does the patient require coordinated care of a physician, rehab nurse, [name] (1-2 hrs/day, 5 days/week), OT (1-2 hrs/day, 5 days/week) and SLP (1-2 hrs/day, 5 days/week) to address physical and functional deficits in the context of the above medical diagnosis(es)? Yes Addressing deficits in the following areas: balance, endurance, locomotion, strength, transferring, bowel/bladder control, bathing, dressing, feeding, grooming, toileting, cognition, speech, language, swallowing and psychosocial support 5. Can the patient actively participate in an intensive therapy program of at least 3 hrs of therapy per day at least 5 days per week? Yes 6. The potential for patient to make measurable gains while on inpatient rehab is excellent 7. Anticipated functional outcomes upon discharge from inpatient rehab are supervision  with [name], supervision and min assist with OT, supervision and min assist with SLP. 8. Estimated rehab length of stay to reach the above  functional goals is: 14-18 days 9. Does the patient have adequate social supports and living environment to accommodate these discharge functional goals? Yes 10. Anticipated D/C setting: Home 11. Anticipated post D/C treatments: HH therapy  and Outpatient therapy 12. Overall Rehab/Functional Prognosis: excellent  RECOMMENDATIONS: This patient's condition is appropriate for continued rehabilitative care in the following setting: CIR Patient has agreed to participate in recommended program. [name] unable (wife consents) Note that insurance prior authorization may be required for reimbursement for recommended care.  Comment: Rehab Admissions Coordinator to follow up.  Thanks,  Ranelle Oyster, MD, [place] [facility]     05/20/2015

## 2015-05-20 NOTE — Progress Notes (Signed)
Utilization review completed. Damarko Stitely, RN, BSN. 

## 2015-05-20 NOTE — Progress Notes (Signed)
[place] TEAM 1 - Stepdown/ICU TEAM Progress Note  [name] EXB:[id number] DOB: [birth year] DOA: 05/19/2015 PCP: [name], DO  Admit HPI / Brief Narrative: 81 y.o. BM PMHx hypertension, well-controlled diabetes mellitus type 2 with hemoglobin A1c of 5. 05/13/2015, ESRD on HD M/W/F at [facility] Seizure DO  Presenting to the ED after he sustained a fall secondary to left knee pain and left knee giving out as he was started to turn around with his walker on the way from his room to the bathroom and tripped over subsequently felt well and on the back of his head. Per family patient with no change in mental status with no significant change since fall. Patient denies any fevers, no chills, no nausea, no vomiting, no chest pain, no shortness of breath, no abdominal pain, no dysuria, no diarrhea, no constipation, no melena, no hematemesis, no hematochezia, no cough, no fever, no chills. Patient also complains of generalized weakness. No syncope. The patient was seen in the emergency room Patient was in the emergency room basic metabolic profile obtained had a potassium of 6.4 BUN of 121 and creatinine of 11.43 was also within normal limits. CBC obtained at a hemoglobin of 12.1 and a platelet count of 96 the rest was within normal limits.   HPI/Subjective: 9/13  A/O 3 (does not know where), negative headache, negative vision change, negative CP, negative SOB  Assessment/Plan: Subdural hematoma 2 -9/13 CT head. Shows slight enlargement SDH. Although stating is not significant with patient's falling platelets will maintain patient in SDU. -Every 4 hour neuro check -9/13 Transfuse 1 unit platelets  Hyperkalemia -Resolved with hemodialysis  hypertension Stable. Continue Norvasc.  ESRD on hemodialysis (M/W/F)  -Will need HD in the a.m. ensure nephrology has patient on list  Seizures -No noted seizures. Continue home regimen of vimpat. Outpatient follow-up.    Fall -Mechanical in nature.  -PT/OT. Consult in a.m.  Metabolic acidosis Likely secondary to end-stage renal disease. Patient not on bicarbonate tablets. Defer to nephrology.  Thrombocytopenia chronic in nature -Platelets continue to drop and SDH appears to be extending will transfuse 1 unit platelets   Diabetes mellitus Type controlled -6/9 Hemoglobin A1c= 5.0 -Lipid panel pending - Sensitive SSI  10 posterior scalp laceration -Status post sutures. EGD physician. Sutures to be removed in about 7-10 days.   Code Status: FULL Family Communication: Wife present at time of exam Disposition Plan: Per neurosurgery and nephrology    Consultants: Vernelle Emerald (neurosurgery) Burnett Kanaris (nephrology)   Procedure/Significant Events: 9/12 CT head without contrast/C-spine;-8 mm Lt temporoparietal subdural hematoma/ 4 mm upper Lt parafalcine subdural hematoma -negative midline shift, acute infarction,intraparenchymal hemorrhage, hydrocephalus or mass lesion. -C-spine negative for fracture, subluxation; ; moderate to severe DJD/spondylosis C3-C7  9/13 CT head without contrast; 8 mm in maximum thickness left temporoparietal SDH,more apparent left supratentorial subdural hematoma, tracking along the dural reflections. Not significantly enlarged 9/13 Transfuse 1 unit platelets    Culture   Antibiotics:   DVT prophylaxis: SCD   Devices    LINES / TUBES:      Continuous Infusions:   Objective: VITAL SIGNS: Temp: 98.4 F (36.9 C) (09/13 0400) Temp Source: Oral (09/13 0400) BP: 159/71 mmHg (09/13 0400) Pulse Rate: 83 (09/13 0400) SPO2; FIO2:   Intake/Output Summary (Last 24 hours) at 05/20/15 0732 Last data filed at 05/19/15 2035  Gross per 24 hour  Intake      3 ml  Output    200 ml  Net   -  197 ml     Exam: General: A/O 3 (does not know where),, No acute respiratory distress Eyes: Negative headache, eye pain, double vision, pupils equal round  reactive to light and accommodation negative scleral hemorrhage ENT: Negative Runny nose, negative gingival bleeding, Neck:  Negative scars, masses, torticollis, lymphadenopathy, JVD Lungs: Clear to auscultation bilaterally without wheezes or crackles Cardiovascular: Regular rate and rhythm without murmur gallop or rub normal S1 and S2 Abdomen:negative abdominal pain, nondistended, positive soft, bowel sounds, no rebound, no ascites, no appreciable mass Extremities: No significant cyanosis, clubbing, or edema bilateral lower extremities Psychiatric:  Negative depression, negative anxiety, negative fatigue, negative mania  Neurologic:  Cranial nerves II through XII intact, tongue/uvula midline, all extremities muscle strength 5/5, sensation intact throughout, negative dysarthria, negative expressive aphasia, negative receptive aphasia.   Data Reviewed: Basic Metabolic Panel:  Recent Labs Lab 05/13/15 0925 05/19/15 0920 05/19/15 2105 05/20/15 0313  NA 141 140  --  136  K 5.1 6.4*  --  4.3  CL 97 104  --  96*  CO2 25 19*  --  25  GLUCOSE 127* 126*  --  152*  BUN 56* 121*  --  52*  CREATININE 7.16* 11.43*  --  7.05*  CALCIUM 9.6 9.8  --  9.0  MG  --   --  2.2  --   PHOS  --   --   --  5.1*   Liver Function Tests:  Recent Labs Lab 05/20/15 0313  ALBUMIN 4.1   No results for input(s): LIPASE, AMYLASE in the last 168 hours. No results for input(s): AMMONIA in the last 168 hours. CBC:  Recent Labs Lab 05/13/15 0925 05/19/15 0920 05/20/15 0313  WBC 3.3* 4.1 5.2  NEUTROABS 1.6 3.0  --   HGB  --  12.1* 11.8*  HCT 37.9 36.2* 35.7*  MCV  --  102.8* 102.3*  PLT  --  96* 74*   Cardiac Enzymes: No results for input(s): CKTOTAL, CKMB, CKMBINDEX, TROPONINI in the last 168 hours. BNP (last 3 results) No results for input(s): BNP in the last 8760 hours.  ProBNP (last 3 results) No results for input(s): PROBNP in the last 8760 hours.  CBG: No results for input(s): GLUCAP in  the last 168 hours.  Recent Results (from the past 240 hour(s))  MRSA PCR Screening     Status: None   Collection Time: 05/19/15  6:12 PM  Result Value Ref Range Status   MRSA by PCR NEGATIVE NEGATIVE Final    Comment:        The GeneXpert MRSA Assay (FDA approved for NASAL specimens only), is one component of a comprehensive MRSA colonization surveillance program. It is not intended to diagnose MRSA infection nor to guide or monitor treatment for MRSA infections.      Studies:  Recent x-ray studies have been reviewed in detail by the Attending Physician  Scheduled Meds:  Scheduled Meds: . amLODipine  10 mg Oral Daily  . [START ON 05/21/2015] calcitRIOL  1 mcg Oral Q M,W,F-HD  . calcium acetate  667 mg Oral TID WC  . folic acid  1 mg Oral Daily  . gabapentin  100 mg Oral TID  . lacosamide  50 mg Oral BID  . loratadine  10 mg Oral QODAY  . multivitamin  1 tablet Oral QHS  . pantoprazole  40 mg Oral Q0600  . sodium chloride  3 mL Intravenous Q12H  . sodium chloride  3 mL Intravenous Q12H  Time spent on care of this patient: 40 mins   Keandra Medero, Roselind Messier , MD  [facility] Office  [phone] Pager [phone]  On-Call/Text Page:      [email or web address]      password [email or web address]  If 7PM-7AM, please contact night-coverage [email or web address] Password [email or web address] 05/20/2015, 7:32 AM   LOS: 1 day   Care during the described time interval was provided by me .  I have reviewed this patient's available data, including medical history, events of note, physical examination, and all test results as part of my evaluation. I have personally reviewed and interpreted all radiology studies.   Carolyne Littles, MD [phone] Pager

## 2015-05-20 NOTE — Progress Notes (Signed)
Rehab Admissions Coordinator Note:  Patient was screened by Clois Dupes for appropriateness for an Inpatient Acute Rehab Consult.  At this time, we are recommending Inpatient Rehab consult if pt has 24/7 supervision assist at home. Noted compliance issues with OP hemodialysis per nephrology. Structured environment at SNF may assist with compliance if pt does not have supervision at home.  Clois Dupes 05/20/2015, 1:47 PM  I can be reached at [phone].

## 2015-05-20 NOTE — Evaluation (Signed)
Occupational Therapy Evaluation Patient Details Name: Trampas Stettner MRN: [id number] DOB: 1932-08-31 Today's Date: 05/20/2015    History of Present Illness This a 81 y.o. malewith PMHx: HTN, DM , ESRD, and seizure disorder presenting to the ED after he sustained a fall secondary to left knee pain and left knee giving out as he was started to turn around with his walker on the way from his room to the bathroom and tripped and subsequently fell hitting the back of his head. CT: An 8 mm left temporoparietal subdural hematoma and a 4 mm upper left parafalcine subdural hematoma   Clinical Impression   This 81 yo male admitted with above presents to acute OT with decreased cognition, decreased balance, decreased mobility, left knee pain all affecting his ability to care or help care for himself. He will benefit from acute OT with followup OT on CIR to get to a S level or better to return home.    Follow Up Recommendations  CIR    Equipment Recommendations   (TBD next venue)       Precautions / Restrictions Precautions Precautions: Fall Restrictions Weight Bearing Restrictions: No      Mobility Bed Mobility Overal bed mobility: Needs Assistance Bed Mobility: Supine to Sit     Supine to sit: Min guard        Transfers Overall transfer level: Needs assistance Equipment used: 2 person hand held assist Transfers: Sit to/from [employer] Sit to Stand: Min assist;+2 physical assistance Stand pivot transfers: Mod assist;+2 physical assistance            Balance Overall balance assessment: Needs assistance Sitting-balance support: No upper extremity supported;Feet supported Sitting balance-Leahy Scale: Fair     Standing balance support: Bilateral upper extremity supported Standing balance-Leahy Scale: Poor                              ADL Overall ADL's : Needs assistance/impaired Eating/Feeding: Supervision/ safety;Set up;Sitting   Grooming:  Supervision/safety;Set up;Sitting   Upper Body Bathing: Supervision/ safety;Set up;Sitting   Lower Body Bathing: Maximal assistance (with +2 min sit<>stand from bed)   Upper Body Dressing : Moderate assistance;Sitting   Lower Body Dressing: Total assistance (with +2 min sit<>stand from bed)   Toilet Transfer: +2 for physical assistance;Moderate assistance;Stand-pivot (bed>recliner on his right)   Toileting- Clothing Manipulation and Hygiene: Total assistance (with +2 min sit<>stand from bed)               Vision Additional Comments: Unknown need to further assess in functional context          Pertinent Vitals/Pain Pain Assessment: Faces Faces Pain Scale: Hurts even more Pain Location: "all over" when asked where his pain is. Also holding and rubbing his left knee throughtout session Pain Descriptors / Indicators: Aching;Sore Pain Intervention(s): Limited activity within patient's tolerance;Monitored during session;Repositioned     Hand Dominance Right   Extremity/Trunk Assessment Upper Extremity Assessment Upper Extremity Assessment: Generalized weakness   Lower Extremity Assessment Lower Extremity Assessment: Defer to PT evaluation       Communication Communication Communication:  (slow to respond at times, repeats what is said to him at times)   Cognition Arousal/Alertness: Awake/alert Behavior During Therapy: WFL for tasks assessed/performed Overall Cognitive Status: No family/caregiver present to determine baseline cognitive functioning Area of Impairment: Orientation;Following commands;Safety/judgement;Problem solving Orientation Level: Disoriented to;Situation;Place     Following Commands: Follows one step commands with increased time Safety/Judgement:  Decreased awareness of safety;Decreased awareness of deficits   Problem Solving: Slow processing;Difficulty sequencing;Requires verbal cues;Requires tactile cues                Home Living  Family/patient expects to be discharged to:: Inpatient rehab Living Arrangements: Spouse/significant other (pt stated at one point he lives alone and at another point he lives with his wife)                                      Prior Functioning/Environment Level of Independence:  (unknown due to pt inconsistent with his answers)             OT Diagnosis: Generalized weakness;Cognitive deficits;Acute pain   OT Problem List: Decreased strength;Decreased range of motion;Decreased activity tolerance;Impaired balance (sitting and/or standing);Pain;Decreased cognition;Decreased safety awareness;Decreased knowledge of use of DME or AE   OT Treatment/Interventions: Self-care/ADL training;Patient/family education;DME and/or AE instruction;Therapeutic activities;Cognitive remediation/compensation;Therapeutic exercise;Balance training;Visual/perceptual remediation/compensation    OT Goals(Current goals can be found in the care plan section) Acute Rehab OT Goals Patient Stated Goal: did not state OT Goal Formulation: With patient Time For Goal Achievement: 06/03/15 Potential to Achieve Goals: Good  OT Frequency: Min 3X/week       Co-evaluation PT/OT/SLP Co-Evaluation/Treatment: Yes Reason for Co-Treatment: For patient/therapist safety   OT goals addressed during session: ADL's and self-care;Strengthening/ROM      End of Session Equipment Utilized During Treatment: Gait belt Nurse Communication: Mobility status Psychiatrist)  Activity Tolerance: Patient limited by pain Patient left: in chair;with call bell/phone within reach;with chair alarm set (sitter in room)   Time: [phone] OT Time Calculation (min): 13 min Charges:  OT General Charges $OT Visit: 1 Procedure OT Evaluation $Initial OT Evaluation Tier I: 1 Procedure  Evette Georges [phone] 05/20/2015, 10:12 AM

## 2015-05-20 NOTE — Evaluation (Signed)
Physical Therapy Evaluation Patient Details Name: [name] MRN: [id number] DOB: [birth year] Today's Date: 05/20/2015   History of Present Illness  This a 81 y.o. malewith PMHx: HTN, DM , ESRD, and seizure disorder presenting to the ED after he sustained a fall secondary to left knee pain and left knee giving out as he was started to turn around with his walker on the way from his room to the bathroom and tripped and subsequently fell hitting the back of his head. CT: An 8 mm left temporoparietal subdural hematoma and a 4 mm upper left parafalcine subdural hematoma  Clinical Impression  Patient in bed, agreeable to participate in PT today. Patient presents with deficits listed below and was able to transfer as described below. He will benefit from continued PT while in the acute setting to progress mobility and improve orientation to allow him to discharge to venue recommended below.    Follow Up Recommendations CIR;Supervision/Assistance - 24 hour    Equipment Recommendations  Other (comment) (TBA)    Recommendations for Other Services Rehab consult     Precautions / Restrictions Precautions Precautions: Fall Restrictions Weight Bearing Restrictions: No      Mobility  Bed Mobility Overal bed mobility: Needs Assistance Bed Mobility: Supine to Sit     Supine to sit: Min guard        Transfers Overall transfer level: Needs assistance Equipment used: 2 person hand held assist Transfers: Sit to/from [employer] Sit to Stand: Min assist;+2 physical assistance Stand pivot transfers: Mod assist;+2 physical assistance       General transfer comment: Required min A for full trunk extension and upright posture. Multiple VC's to not sit down and maintain upright posture.  Ambulation/Gait             General Gait Details: Deferred due to weakness and disorientation.  Stairs            Wheelchair Mobility    Modified Rankin (Stroke Patients  Only)       Balance Overall balance assessment: Needs assistance Sitting-balance support: Single extremity supported Sitting balance-Leahy Scale: Fair Sitting balance - Comments: Patient aware he is falling to the right. Able to catch himself with R UE, return to upright with VC's. Postural control: Right lateral lean Standing balance support: Bilateral upper extremity supported Standing balance-Leahy Scale: Poor                               Pertinent Vitals/Pain Pain Assessment: Faces Faces Pain Scale: Hurts even more Pain Location: Mostly L Knee. At one point states "hurts all over." Pain Descriptors / Indicators: Aching;Guarding Pain Intervention(s): Limited activity within patient's tolerance;Monitored during session    Home Living Family/patient expects to be discharged to:: Inpatient rehab Living Arrangements: Spouse/significant other               Additional Comments: Patient was an inconsistent historian and is completely disoriented. At one point he stated multiple times that he lives alone, another that he lives with his wife and that she takes him to dialysis. His common response to questions was "I just don't know."    Prior Function Level of Independence:  (Unable to assess due to patient's response to questions.)               Hand Dominance   Dominant Hand: Right    Extremity/Trunk Assessment   Upper Extremity Assessment: Defer to OT evaluation  Lower Extremity Assessment: Generalized weakness (Reports L Knee is sore.)         Communication   Communication: Other (comment);Expressive difficulties (Unsure if pt has hard time understanding or responding.)  Cognition Arousal/Alertness: Awake/alert Behavior During Therapy: WFL for tasks assessed/performed Overall Cognitive Status: No family/caregiver present to determine baseline cognitive functioning Area of Impairment: Orientation;Memory;Following  commands;Safety/judgement;Problem solving Orientation Level: Disoriented to;Place;Time;Situation   Memory: Decreased short-term memory Following Commands: Follows one step commands with increased time Safety/Judgement: Decreased awareness of safety   Problem Solving: Slow processing;Difficulty sequencing;Requires verbal cues;Requires tactile cues General Comments: Unable to orient patient to situation and place during session. Multiple attempts made throughout.    General Comments      Exercises        Assessment/Plan    PT Assessment Patient needs continued PT services  PT Diagnosis Difficulty walking;Abnormality of gait;Generalized weakness;Acute pain;Altered mental status   PT Problem List Decreased strength;Decreased range of motion;Decreased activity tolerance;Decreased balance;Decreased mobility;Decreased cognition;Decreased safety awareness  PT Treatment Interventions DME instruction;Gait training;Functional mobility training;Therapeutic activities;Therapeutic exercise;Balance training;Neuromuscular re-education;Cognitive remediation;Patient/family education   PT Goals (Current goals can be found in the Care Plan section) Acute Rehab PT Goals Patient Stated Goal: Unable to state PT Goal Formulation: Patient unable to participate in goal setting Time For Goal Achievement: 06/03/15 Potential to Achieve Goals: Good    Frequency Min 3X/week   Barriers to discharge        Co-evaluation PT/OT/SLP Co-Evaluation/Treatment: Yes Reason for Co-Treatment: For patient/therapist safety;Necessary to address cognition/behavior during functional activity PT goals addressed during session: Mobility/safety with mobility;Balance;Strengthening/ROM OT goals addressed during session: ADL's and self-care;Strengthening/ROM       End of Session Equipment Utilized During Treatment: Gait belt Activity Tolerance: Patient limited by pain;Treatment limited secondary to medical complications  (Comment) (Disorientation) Patient left: in chair;with call bell/phone within reach;with chair alarm set;with nursing/sitter in room Nurse Communication: Mobility status         Time: 6073-7106 PT Time Calculation (min) (ACUTE ONLY): 18 min   Charges:   PT Evaluation $Initial PT Evaluation Tier I: 1 Procedure     PT G CodesMichele Rockers, SPT [phone] 05/20/2015, 11:55 AM  I have read, reviewed and agree with student's note.   [facility] Acute Rehabilitation [phone] (pager)

## 2015-05-20 NOTE — Progress Notes (Signed)
Assessment/Plan: 1. 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 Hyperkalemia/metobolic acidosis, improved 3. ESRD - MWF HD on schedule for Wed 4. Hypertension/volume - getting below EDW, BP comes down during treatments 5. Anemia - Hgb 12 - hold ESA - continue Fe repletion 6. Metabolic bone disease - Continue calcitriol 7. Nutrition -renal diet/vits 8. Thrombocytopenia - follow trend 9. Seizure disorder -  10. Gout - on colchicine 11. DM - per primary 12. Dialysis noncompliance- has no insight into this. Will see if he does better staying on treatments while here.  Subjective: Interval History: Swollen left knee  Objective: Vital signs in last 24 hours: Temp:  [97.8 F (36.6 C)-98.5 F (36.9 C)] 97.9 F (36.6 C) (09/13 0830) Pulse Rate:  [59-93] 84 (09/13 0830) Resp:  [13-19] 16 (09/13 0830) BP: (96-180)/(40-90) 152/75 mmHg (09/13 0859) SpO2:  [97 %-100 %] 99 % (09/13 0830) Weight:  [91.2 kg (201 lb 1 oz)-96 kg (211 lb 10.3 oz)] 91.2 kg (201 lb 1 oz) (09/13 0500) Weight change: 0.291 kg (10.3 oz)  Intake/Output from previous day: 09/12 0701 - 09/13 0700 In: 3 [I.V.:3] Out: 200  Intake/Output this shift:    General appearance: cooperative and confused, biut better Resp: clear to auscultation bilaterally Chest wall: no tenderness Cardio: regular rate and rhythm, S1, S2 normal, no murmur, click, rub or gallop Extremities: sl swollen left knee, tender  Lab Results:  Recent Labs  05/19/15 0920 05/20/15 0313  WBC 4.1 5.2  HGB 12.1* 11.8*  HCT 36.2* 35.7*  PLT 96* 74*   BMET:  Recent Labs  05/19/15 0920 05/20/15 0313  NA 140 136  K 6.4* 4.3  CL 104 96*  CO2 19* 25  GLUCOSE 126* 152*  BUN 121* 52*  CREATININE 11.43* 7.05*  CALCIUM 9.8 9.0   No results for input(s): PTH in the last 72 hours. Iron Studies: No results for input(s): IRON, TIBC, TRANSFERRIN, FERRITIN in the last  72 hours. Studies/Results: Ct Head Wo Contrast  05/19/2015   CLINICAL DATA:  81 year old male with head and neck injury following acute fall. Headache and cervical spine pain.  EXAM: CT HEAD WITHOUT CONTRAST  CT CERVICAL SPINE WITHOUT CONTRAST  TECHNIQUE: Multidetector CT imaging of the head and cervical spine was performed following the standard protocol without intravenous contrast. Multiplanar CT image reconstructions of the cervical spine were also generated.  COMPARISON:  10/01/2013 and prior CTs.  10/06/2013 MR.  FINDINGS: CT HEAD FINDINGS  An 8 mm left temporoparietal subdural hematoma and a 4 mm upper left parafalcine subdural hematoma are identified.  There is no evidence of midline shift, acute infarction, intraparenchymal hemorrhage, hydrocephalus or mass lesion.  Atrophy and chronic small-vessel white matter ischemic changes are again identified.  No acute bony abnormalities are noted.  A tiny amount of fluid in the right maxillary sinus is identified.  CT CERVICAL SPINE FINDINGS  Reversal of the normal cervical lordosis is noted.  There is no evidence acute fracture, subluxation or prevertebral soft tissue swelling.  Moderate -severe degenerative disc disease/spondylosis from C3-C7 noted causing mild -moderate central spinal and bony foraminal narrowing at several levels.  No focal bony lesions are identified.  Soft tissue structures are unremarkable except for vascular calcifications.  The lung apices are clear.  IMPRESSION: 8 mm left temporoparietal subdural hematoma and 4 mm left parafalcine subdural hematoma. No evidence of midline shift.  No static evidence of acute  injury to the cervical spine. Moderate -severe degenerative changes as described.  Cerebral atrophy and chronic small-vessel white matter ischemic changes.  Critical Value/emergent results were called by telephone at the time of interpretation on 05/19/2015 at 9:00 am to Dr. Gerhard Munch , who verbally acknowledged these results.    Electronically Signed   By: Harmon Pier M.D.   On: 05/19/2015 09:00   Ct Cervical Spine Wo Contrast  05/19/2015   CLINICAL DATA:  81 year old male with head and neck injury following acute fall. Headache and cervical spine pain.  EXAM: CT HEAD WITHOUT CONTRAST  CT CERVICAL SPINE WITHOUT CONTRAST  TECHNIQUE: Multidetector CT imaging of the head and cervical spine was performed following the standard protocol without intravenous contrast. Multiplanar CT image reconstructions of the cervical spine were also generated.  COMPARISON:  10/01/2013 and prior CTs.  10/06/2013 MR.  FINDINGS: CT HEAD FINDINGS  An 8 mm left temporoparietal subdural hematoma and a 4 mm upper left parafalcine subdural hematoma are identified.  There is no evidence of midline shift, acute infarction, intraparenchymal hemorrhage, hydrocephalus or mass lesion.  Atrophy and chronic small-vessel white matter ischemic changes are again identified.  No acute bony abnormalities are noted.  A tiny amount of fluid in the right maxillary sinus is identified.  CT CERVICAL SPINE FINDINGS  Reversal of the normal cervical lordosis is noted.  There is no evidence acute fracture, subluxation or prevertebral soft tissue swelling.  Moderate -severe degenerative disc disease/spondylosis from C3-C7 noted causing mild -moderate central spinal and bony foraminal narrowing at several levels.  No focal bony lesions are identified.  Soft tissue structures are unremarkable except for vascular calcifications.  The lung apices are clear.  IMPRESSION: 8 mm left temporoparietal subdural hematoma and 4 mm left parafalcine subdural hematoma. No evidence of midline shift.  No static evidence of acute injury to the cervical spine. Moderate -severe degenerative changes as described.  Cerebral atrophy and chronic small-vessel white matter ischemic changes.  Critical Value/emergent results were called by telephone at the time of interpretation on 05/19/2015 at 9:00 am to Dr. Gerhard Munch , who verbally acknowledged these results.   Electronically Signed   By: Harmon Pier M.D.   On: 05/19/2015 09:00   Scheduled: . amLODipine  10 mg Oral Daily  . [START ON 05/21/2015] calcitRIOL  1 mcg Oral Q M,W,F-HD  . calcium acetate  667 mg Oral TID WC  . folic acid  1 mg Oral Daily  . gabapentin  100 mg Oral TID  . lacosamide  50 mg Oral BID  . loratadine  10 mg Oral QODAY  . multivitamin  1 tablet Oral QHS  . pantoprazole  40 mg Oral Q0600  . sodium chloride  3 mL Intravenous Q12H  . sodium chloride  3 mL Intravenous Q12H      LOS: 1 day   Jupiter Boys C 05/20/2015,9:45 AM

## 2015-05-21 ENCOUNTER — Inpatient Hospital Stay (HOSPITAL_COMMUNITY): Payer: Medicare Other

## 2015-05-21 DIAGNOSIS — R4701 Aphasia: Secondary | ICD-10-CM

## 2015-05-21 LAB — CBC WITH DIFFERENTIAL/PLATELET
Basophils Absolute: 0 10*3/uL (ref 0.0–0.1)
Basophils Relative: 1 %
EOS ABS: 0.1 10*3/uL (ref 0.0–0.7)
Eosinophils Relative: 3 %
HEMATOCRIT: 30.9 % — AB (ref 39.0–52.0)
HEMOGLOBIN: 10.4 g/dL — AB (ref 13.0–17.0)
Lymphocytes Relative: 32 %
Lymphs Abs: 1.2 10*3/uL (ref 0.7–4.0)
MCH: 34.4 pg — AB (ref 26.0–34.0)
MCHC: 33.7 g/dL (ref 30.0–36.0)
MCV: 102.3 fL — AB (ref 78.0–100.0)
Monocytes Absolute: 0.4 10*3/uL (ref 0.1–1.0)
Monocytes Relative: 10 %
Neutro Abs: 2 10*3/uL (ref 1.7–7.7)
Neutrophils Relative %: 54 %
Platelets: 95 10*3/uL — ABNORMAL LOW (ref 150–400)
RBC: 3.02 MIL/uL — ABNORMAL LOW (ref 4.22–5.81)
RDW: 15.9 % — ABNORMAL HIGH (ref 11.5–15.5)
WBC: 3.7 10*3/uL — AB (ref 4.0–10.5)

## 2015-05-21 LAB — RENAL FUNCTION PANEL
ALBUMIN: 3.7 g/dL (ref 3.5–5.0)
ANION GAP: 13 (ref 5–15)
BUN: 67 mg/dL — AB (ref 6–20)
CALCIUM: 8.5 mg/dL — AB (ref 8.9–10.3)
CO2: 24 mmol/L (ref 22–32)
Chloride: 101 mmol/L (ref 101–111)
Creatinine, Ser: 9.13 mg/dL — ABNORMAL HIGH (ref 0.61–1.24)
GFR calc Af Amer: 5 mL/min — ABNORMAL LOW (ref >60)
GFR, EST NON AFRICAN AMERICAN: 5 mL/min — AB (ref >60)
Glucose, Bld: 118 mg/dL — ABNORMAL HIGH (ref 65–99)
PHOSPHORUS: 6.2 mg/dL — AB (ref 2.5–4.6)
Potassium: 4.5 mmol/L (ref 3.5–5.1)
SODIUM: 138 mmol/L (ref 135–145)

## 2015-05-21 LAB — PREPARE PLATELET PHERESIS: Unit division: 0

## 2015-05-21 LAB — GLUCOSE, CAPILLARY
GLUCOSE-CAPILLARY: 188 mg/dL — AB (ref 65–99)
GLUCOSE-CAPILLARY: 259 mg/dL — AB (ref 65–99)
Glucose-Capillary: 90 mg/dL (ref 65–99)
Glucose-Capillary: 174 mg/dL — ABNORMAL HIGH (ref 65–99)
Glucose-Capillary: 239 mg/dL — ABNORMAL HIGH (ref 65–99)

## 2015-05-21 MED ORDER — CALCITRIOL 0.5 MCG PO CAPS
ORAL_CAPSULE | ORAL | Status: AC
  Filled 2015-05-21: qty 2

## 2015-05-21 MED ORDER — ALTEPLASE 2 MG IJ SOLR
2.0000 mg | Freq: Once | INTRAMUSCULAR | Status: DC | PRN
  Filled 2015-05-21: qty 2

## 2015-05-21 MED ORDER — ACETAMINOPHEN 325 MG PO TABS
ORAL_TABLET | ORAL | Status: AC
  Administered 2015-05-21: 325 mg
  Filled 2015-05-21: qty 2

## 2015-05-21 MED ORDER — PENTAFLUOROPROP-TETRAFLUOROETH EX AERO: 1.0000 "application " | INHALATION_SPRAY | CUTANEOUS | Status: DC | PRN

## 2015-05-21 MED ORDER — GABAPENTIN 100 MG PO CAPS
100.0000 mg | ORAL_CAPSULE | Freq: Every day | ORAL | Status: DC
  Administered 2015-05-22 – 2015-05-23 (×2): 100 mg via ORAL
  Filled 2015-05-21 (×2): qty 1

## 2015-05-21 MED ORDER — SODIUM CHLORIDE 0.9 % IV SOLN: 100.0000 mL | INTRAVENOUS | Status: DC | PRN

## 2015-05-21 MED ORDER — LIDOCAINE-PRILOCAINE 2.5-2.5 % EX CREA: 1.0000 "application " | TOPICAL_CREAM | CUTANEOUS | Status: DC | PRN

## 2015-05-21 MED ORDER — LIDOCAINE HCL (PF) 1 % IJ SOLN: 5.0000 mL | INTRAMUSCULAR | Status: DC | PRN

## 2015-05-21 MED ORDER — HEPARIN SODIUM (PORCINE) 1000 UNIT/ML DIALYSIS: 1000.0000 [IU] | INTRAMUSCULAR | Status: DC | PRN

## 2015-05-21 NOTE — Progress Notes (Signed)
Patient family expressed concern that the patient was extremely confused. Voiced concern that he may have had a stroke and that his level of confusion was beyond what he usually experiences with Dialysis confusion. Patient was having a difficult time getting words out and using the wrong words for things. Placed a call to Dr. Sharon Seller. Per doctor- stat CT ordered. Noe Gens, RN

## 2015-05-21 NOTE — Progress Notes (Signed)
[place] TEAM 1 - Stepdown/ICU TEAM PROGRESS NOTE  [name] BEE:[id number] DOB: August 26, 1932 DOA: 05/19/2015 PCP: [name], DO  Admit HPI / Brief Narrative: 81 y.o. M Hx hypertension, well-controlled diabetes mellitus type 2, ESRD, and Seizure DO who presented to the ED after he sustained a fall secondary to his left knee giving out as he was started to turn around with his walker. Per family patient with no change in mental status since fall.   CT head obtained in the emergency room revealed an 8 mm left temporoparietal subdural hematoma and a 4 mm left parafalcine subdural hematoma with no evidence of midline shift.  HPI/Subjective: Since returning from hemodialysis the patient's family has noted that he is suffering with significant word finding difficulty.  They state this was not present prior to dialysis today.  Indeed during my examination the patient is having difficulty finding every fourth to fifth word in his speech.  Family states this has waxed and waned since returning from dialysis approximately an hour ago.  He has no focal motor deficits and no obvious cranial nerve deficits.  A stat CT head to follow-up his subdurals has suggested there essentially stable.  Assessment/Plan:  Subdural hematoma 2 Neurosurgery following - follow-up CT 9/13 noted no significant enlargement in subdural hemorrhages - repeat stat CT head this afternoon without significant change  Altered mentation / word finding difficulty This appears to be a new development this afternoon - patient states it may have begun at some point during dialysis of the exact onset of symptoms is not on percent clear - nonetheless the patient is not a candidate for TPA given his subdural hemorrhages therefore I have not activated a code stroke - in that his symptoms are persisting I have asked Neurology to evaluate him  hyperkalemia Resolved with hemodialysis  hypertension No change in treatment plan  today  end-stage renal disease on hemodialysis  Nephrology following  history of seizures Continue home medical therapy  Fall Mechanical in nature - PT/OT  thrombocytopenia chronic in nature Transfused platelets 9/13 in setting of subdural - platelet count stable/improved  Diabetes mellitus A1c 5.0 02/13/15 - CBG well controlled  posterior scalp laceration Sutures to be removed in 7-10 days  Code Status: FULL Family Communication: Spoke with son and wife (who has mild dementia) at bedside Disposition Plan: SDU  Consultants: Nephrology Neurosurgery Neurology  Procedures: None  Antibiotics: None  DVT prophylaxis: SCDs  Objective: Blood pressure 130/63, pulse 78, temperature 97.7 F (36.5 C), temperature source Oral, resp. rate 12, height 6' (1.829 m), weight 91.6 kg (201 lb 15.1 oz), SpO2 99 %.  Intake/Output Summary (Last 24 hours) at 05/21/15 1533 Last data filed at 05/21/15 1212  Gross per 24 hour  Intake    897 ml  Output   1700 ml  Net   -803 ml   Exam: General: No acute respiratory distress Lungs: Clear to auscultation bilaterally without wheezes or crackles Cardiovascular: Regular rate and rhythm without murmur gallop or rub  Abdomen: Nontender, nondistended, soft, bowel sounds positive, no rebound, no ascites, no appreciable mass Extremities: No significant cyanosis, clubbing, or edema bilateral lower extremities Neurologic:  Alert and oriented 4, word finding difficulty appreciated, cranial nerves II through XII intact bilaterally, 5 over 5 strength bilateral upper and lower extremities, no Babinski  Data Reviewed: Basic Metabolic Panel:  Recent Labs Lab 05/19/15 0920 05/19/15 2105 05/20/15 0313 05/21/15 0815  NA 140  --  136 138  K 6.4*  --  4.3 4.5  CL 104  --  96* 101  CO2 19*  --  25 24  GLUCOSE 126*  --  152* 118*  BUN 121*  --  52* 67*  CREATININE 11.43*  --  7.05* 9.13*  CALCIUM 9.8  --  9.0 8.5*  MG  --  2.2  --   --   PHOS  --    --  5.1* 6.2*    CBC:  Recent Labs Lab 05/19/15 0920 05/20/15 0313 05/21/15 0815  WBC 4.1 5.2 3.7*  NEUTROABS 3.0  --  2.0  HGB 12.1* 11.8* 10.4*  HCT 36.2* 35.7* 30.9*  MCV 102.8* 102.3* 102.3*  PLT 96* 74* 95*    Liver Function Tests:  Recent Labs Lab 05/20/15 0313 05/21/15 0815  ALBUMIN 4.1 3.7    CBG:  Recent Labs Lab 05/20/15 2340 05/21/15 0307 05/21/15 1202  GLUCAP 188* 90 174*    Recent Results (from the past 240 hour(s))  MRSA PCR Screening     Status: None   Collection Time: 05/19/15  6:12 PM  Result Value Ref Range Status   MRSA by PCR NEGATIVE NEGATIVE Final    Comment:        The GeneXpert MRSA Assay (FDA approved for NASAL specimens only), is one component of a comprehensive MRSA colonization surveillance program. It is not intended to diagnose MRSA infection nor to guide or monitor treatment for MRSA infections.      Studies:   Recent x-ray studies have been reviewed in detail by the Attending Physician  Scheduled Meds:  Scheduled Meds: . sodium chloride   Intravenous Once  . amLODipine  10 mg Oral Daily  . calcitRIOL  1 mcg Oral Q M,W,F-HD  . calcium acetate  667 mg Oral TID WC  . folic acid  1 mg Oral Daily  . gabapentin  100 mg Oral TID  . insulin aspart  0-9 Units Subcutaneous 6 times per day  . lacosamide  50 mg Oral BID  . loratadine  10 mg Oral QODAY  . multivitamin  1 tablet Oral QHS  . pantoprazole  40 mg Oral Q0600  . sodium chloride  3 mL Intravenous Q12H  . sodium chloride  3 mL Intravenous Q12H    Time spent on care of this patient: 35 mins   MCCLUNG,JEFFREY T , MD   [facility] Office  [phone] Pager - Text Page per Loretha Stapler as per below:  On-Call/Text Page:      [email or web address]      password [email or web address]  If 7PM-7AM, please contact night-coverage [email or web address] Password [email or web address] 05/21/2015, 3:33 PM   LOS: 2 days

## 2015-05-21 NOTE — Procedures (Signed)
Tolerating hemodialysis. Stable hemodynamics.  No heparin ordered.  Alert and oriented, much improved.  Eval in progress for rehab. Andrew Noguchi C'

## 2015-05-21 NOTE — Consult Note (Signed)
Referring Physician: Sharon Seller     Chief Complaint: Stroke  HPI:                                                                                                                                         [name] is an 81 y.o. male with history of dementia, ESRD on Dialysis, DM, HTN, seizures well controlled on Vimpat 50 mg BID.  He was admitted to hospital secondary to mechanical fall due to knee giving out resulting in 2 SDH. Follow up head CT on 9/14 shows no change in SDH.  Today patient went to HD and following hemodialysis he was noted to have word finding difficulties greater than baseline. Neurology was asked to see patient.   Date last known well: Date: 05/21/2015 Time last known well: Time: 14:00 tPA Given: No: SDH   Past Medical History  Diagnosis Date  . Hypertension   . Diabetes mellitus without complication   . Arthritis   . History of blood transfusion   . GI bleed   . ESRD (end stage renal disease)   . Seizure   . Dialysis patient     Past Surgical History  Procedure Laterality Date  . Esophagogastroduodenoscopy  10/03/2012    Procedure: ESOPHAGOGASTRODUODENOSCOPY (EGD);  Surgeon: Theda Belfast, MD;  Location: [facility] ENDOSCOPY;  Service: Endoscopy;  Laterality: N/A;  . Colonoscopy  10/04/2012    Procedure: COLONOSCOPY;  Surgeon: Theda Belfast, MD;  Location: [facility] ENDOSCOPY;  Service: Endoscopy;  Laterality: N/A;  . Colonoscopy with esophagogastroduodenoscopy (egd) Left 11/30/2012    Procedure: COLONOSCOPY WITH ESOPHAGOGASTRODUODENOSCOPY (EGD);  Surgeon: Theda Belfast, MD;  Location: [facility] ENDOSCOPY;  Service: Endoscopy;  Laterality: Left;  . Spine surgery  2004    back fusion/ [facility] Penumalli,MD  . Retinal laser procedure Right 04/2015    Family History  Problem Relation Age of Onset  . Sudden death Mother   . Sudden death Father    Social History:  reports that he has never smoked. He has never used smokeless tobacco. He reports that he does not drink alcohol or use  illicit drugs.  Allergies:  Allergies  Allergen Reactions  . Aspirin Other (See Comments)    Bleeding   . Lactose Intolerance (Gi) Diarrhea    Medications:  Prior to Admission:  Prescriptions prior to admission  Medication Sig Dispense Refill Last Dose  . amLODipine (NORVASC) 10 MG tablet Take 10 mg by mouth Daily.    05/18/2015 at Unknown time  . BESIVANCE 0.6 % SUSP Place 1 drop into the right eye 4 (four) times daily as needed. For 2 days after each monthly injection  1 Past Month at Unknown time  . calcium acetate (PHOSLO) 667 MG capsule Take 2,001 mg by mouth 3 (three) times daily.   0 05/18/2015 at Unknown time  . colchicine 0.6 MG tablet as needed.    05/18/2015 at Unknown time  . cyclobenzaprine (FLEXERIL) 5 MG tablet Take 5 mg by mouth daily as needed.  0 Past Week at Unknown time  . darbepoetin (ARANESP) 60 MCG/0.3ML SOLN injection Inject 0.3 mLs (60 mcg total) into the vein every Thursday with hemodialysis. (Patient taking differently: Inject 60 mcg into the vein every 7 (seven) days. ) 4.2 mL  Past Week at Unknown time  . fexofenadine (ALLEGRA) 180 MG tablet Take 180 mg by mouth daily. As needed   05/18/2015 at Unknown time  . folic acid (FOLVITE) 1 MG tablet Take 1 tablet (1 mg total) by mouth daily. 30 tablet 0 05/18/2015 at Unknown time  . gabapentin (NEURONTIN) 100 MG capsule Take 1 capsule (100 mg total) by mouth 3 (three) times daily. 90 capsule 3 05/18/2015 at Unknown time  . HUMALOG KWIKPEN 100 UNIT/ML KiwkPen Sliding Scale  0 05/18/2015 at Unknown time  . lacosamide (VIMPAT) 50 MG TABS tablet Take 1 tablet (50 mg total) by mouth 2 (two) times daily. 180 tablet 4 05/18/2015 at Unknown time  . multivitamin (RENA-VIT) TABS tablet Take 1 tablet by mouth at bedtime. 30 tablet 0 05/18/2015 at Unknown time  . senna-docusate (SENOKOT-S) 8.6-50 MG per tablet Take 1  tablet by mouth. As needed   Past Week at Unknown time  . triamcinolone (NASACORT ALLERGY 24HR) 55 MCG/ACT AERO nasal inhaler Place 1 spray into the nose. One spray nasal as needed for allery   05/18/2015 at Unknown time  . ACCU-CHEK AVIVA PLUS test strip Check blood sugar three times daily   Taking  . doxercalciferol (HECTOROL) 4 MCG/2ML injection Inject 2 mLs (4 mcg total) into the vein Every Tuesday,Thursday,and Saturday with dialysis. (Patient taking differently: Inject 4 mcg into the vein every Monday, Wednesday, and Friday with hemodialysis. ) 2 mL  05/16/2015  . sodium chloride 0.9 % SOLN 100 mL with ferric gluconate 12.5 MG/ML SOLN 62.5 mg Inject 62.5 mg into the vein every Thursday with hemodialysis.   Taking  . Tdap (BOOSTRIX) 5-2.5-18.5 LF-MCG/0.5 injection Inject 0.5 mLs into the muscle once. 0.5 mL 0 Taking  . zoster vaccine live, PF, (ZOSTAVAX) 16109 UNT/0.65ML injection Inject 19,400 Units into the skin once. 1 each 0 Taking   Scheduled: . amLODipine  10 mg Oral Daily  . calcitRIOL  1 mcg Oral Q M,W,F-HD  . calcium acetate  667 mg Oral TID WC  . folic acid  1 mg Oral Daily  . [START ON 05/22/2015] gabapentin  100 mg Oral Daily  . insulin aspart  0-9 Units Subcutaneous 6 times per day  . lacosamide  50 mg Oral BID  . multivitamin  1 tablet Oral QHS  . pantoprazole  40 mg Oral Q0600    ROS:  History obtained from unobtainable from patient due to language barrier   Neurologic Examination:                                                                                                      Blood pressure 130/63, pulse 78, temperature 97.7 F (36.5 C), temperature source Oral, resp. rate 12, height 6' (1.829 m), weight 91.6 kg (201 lb 15.1 oz), SpO2 99 %.  HEENT-  Normocephalic, no lesions, without obvious abnormality.  Normal external eye and  conjunctiva.  Normal TM's bilaterally.  Normal auditory canals and external ears. Normal external nose, mucus membranes and septum.  Normal pharynx. Cardiovascular- S1, S2 normal, pulses palpable throughout   Lungs- chest clear, no wheezing, rales, normal symmetric air entry Abdomen- normal findings: bowel sounds normal Extremities- no edema Lymph-no adenopathy palpable Musculoskeletal-no joint tenderness, deformity or swelling Skin-warm and dry, no hyperpigmentation, vitiligo, or suspicious lesions  Neurological Examination Mental Status: Alert, oriented to hospital and year.  Speech fluent at time and non-fluent at times with both expressive and receptive aphasia and paraphasic errors. . Difficulty following simple commands due to aphasia. Cranial Nerves: II: Discs flat bilaterally; Visual fields grossly normal, pupils equal, round, reactive to light and accommodation III,IV, VI: ptosis not present, extra-ocular motions intact bilaterally V,VII: smile symmetric, facial light touch sensation normal bilaterally VIII: hearing normal bilaterally IX,X: uvula rises symmetrically XI: bilateral shoulder shrug XII: midline tongue extension Motor: Right : Upper extremity   5/5    Left:     Upper extremity   5/5  Lower extremity   5/5     Lower extremity   5/5 Tone and bulk:normal tone throughout; no atrophy noted Sensory: Pinprick and light touch intact throughout, bilaterally Deep Tendon Reflexes: 2+ and symmetric throughout UE and no KJ or AJ Plantars: Mute bilaterally Cerebellar: normal finger-to-nose and normal heel-to-shin test Gait: Not tested due to safety       Lab Results: Basic Metabolic Panel:  Recent Labs Lab 05/19/15 0920 05/19/15 2105 05/20/15 0313 05/21/15 0815  NA 140  --  136 138  K 6.4*  --  4.3 4.5  CL 104  --  96* 101  CO2 19*  --  25 24  GLUCOSE 126*  --  152* 118*  BUN 121*  --  52* 67*  CREATININE 11.43*  --  7.05* 9.13*  CALCIUM 9.8  --  9.0 8.5*   MG  --  2.2  --   --   PHOS  --   --  5.1* 6.2*    Liver Function Tests:  Recent Labs Lab 05/20/15 0313 05/21/15 0815  ALBUMIN 4.1 3.7   No results for input(s): LIPASE, AMYLASE in the last 168 hours. No results for input(s): AMMONIA in the last 168 hours.  CBC:  Recent Labs Lab 05/19/15 0920 05/20/15 0313 05/21/15 0815  WBC 4.1 5.2 3.7*  NEUTROABS 3.0  --  2.0  HGB 12.1* 11.8* 10.4*  HCT 36.2* 35.7* 30.9*  MCV 102.8* 102.3* 102.3*  PLT 96* 74* 95*    Cardiac Enzymes: No results  for input(s): CKTOTAL, CKMB, CKMBINDEX, TROPONINI in the last 168 hours.  Lipid Panel:  Recent Labs Lab 05/20/15 0313  CHOL 111  TRIG 63  HDL 53  CHOLHDL 2.1  VLDL 13  LDLCALC 45    CBG:  Recent Labs Lab 05/20/15 2340 05/21/15 0307 05/21/15 1202  GLUCAP 188* 90 174*    Microbiology: Results for orders placed or performed during the hospital encounter of 05/19/15  MRSA PCR Screening     Status: None   Collection Time: 05/19/15  6:12 PM  Result Value Ref Range Status   MRSA by PCR NEGATIVE NEGATIVE Final    Comment:        The GeneXpert MRSA Assay (FDA approved for NASAL specimens only), is one component of a comprehensive MRSA colonization surveillance program. It is not intended to diagnose MRSA infection nor to guide or monitor treatment for MRSA infections.     Coagulation Studies: No results for input(s): LABPROT, INR in the last 72 hours.  Imaging: Ct Head Wo Contrast  05/21/2015   CLINICAL DATA:  Altered mental status.  Subdural hematoma.  EXAM: CT HEAD WITHOUT CONTRAST  TECHNIQUE: Contiguous axial images were obtained from the base of the skull through the vertex without intravenous contrast.  COMPARISON:  CT head without contrast 05/20/2015  FINDINGS: Extensive subdural blood over the left hemisphere is similar to just slightly increased compared to the prior study. Maximal measurement along the posterior falx is 2.5 mm. Maximal measurement adjacent to  the left temporal lobe is 9.5 mm.  There is no significant midline shift. There is slight sulcal effacement.  Moderate generalized white matter disease is otherwise stable.  The basal ganglia are intact. The insular ribbon is normal bilaterally.  IMPRESSION: 1. Stable to slight increase in diffuse subdural hematoma surrounding the left hemisphere. 2. Mild sulcal effacement without significant midline shift. 3. Stable mild diffuse white matter disease.   Electronically Signed   By: Marin Roberts M.D.   On: 05/21/2015 15:55   Ct Head Wo Contrast  05/20/2015   CLINICAL DATA:  Subdural hematoma.  EXAM: CT HEAD WITHOUT CONTRAST  TECHNIQUE: Contiguous axial images were obtained from the base of the skull through the vertex without intravenous contrast.  COMPARISON:  05/19/2015  FINDINGS: Again identified is 8 mm in maximum thickness left temporoparietal subdural hematoma. Blood products are again seen along the left side of falx cerebri. Subdural hematoma is now apparent along the left tentorium.  No evidence of a mass effect or midline shift. No evidence of acute infarction. Again seen are chronic small vessel white matter ischemic changes, and brain parenchymal atrophy. Basal ganglia calcifications also seen. Basal cisterns are preserved.  No depressed skull fractures. Small amount fluid within the right maxillary sinus. Visualized paranasal sinuses and mastoid air cells are otherwise not opacified.  IMPRESSION: Not significantly enlarged, but rather more apparent left supratentorial subdural hematoma, tracking along the dural reflections. No evidence of midline shift.   Electronically Signed   By: Ted Mcalpine M.D.   On: 05/20/2015 12:52    Felicie Morn PA-C [facility] [phone]  05/21/2015, 4:03 PM   Assessment: 81 y.o. male with new onset expressive and receptive aphasia. Att his time cannot exclude possible CVA versus effects of SDH with possible partial seizure  manifestations,, especially with SDH affecting the left hemisphere.  Stroke Risk Factors - diabetes mellitus and hypertension  Recommend: 1) MRI brain --if positive continue with stroke work up 2) EEG 3) Continue with Vimpat at  current dose and will adjust per EEG.   I personally participated in this patient's evaluation and management, including formulating above clinical impression and management recommendations.  Venetia Maxon M.D. [facility] [phone]

## 2015-05-21 NOTE — Progress Notes (Signed)
Inpatient Rehabilitation   I met with son, [name] ([phone]) at the bedside, with pt. and wife present. Pt. with ongoing aphasia and has had a CT scan.  For MRI, EEG and possible further CVA workup.    I further discussed need for rehab with pt's son, including the benefits of CIR and of SNF once pt. Is medically ready.  Son wants to talk to his sister this evening before giving a decision as to family's intent/availability for 24 hour assist.  Son to follow up with Danne Baxter [phone]) on family's decision.  Son has also spoken with [place], SW by his report.    Please call if questions.  [place] Admissions Coordinator Cell [phone] Office [phone]

## 2015-05-21 NOTE — Progress Notes (Signed)
Inpatient Rehabilitation   I met with Mr.  And Mrs Wollman at the bedside to discuss [name]'s post acute rehab needs .  I introduced the idea of IP Rehab vs SNF and provided informational bookets .  During my course of conversation with [name]. and wife, I noted that [name]. was experiencing some difficulty with saying the right words/aphasia.  I notified [name]'s RN who stated that Dr. Thereasa Solo had ordered a stat CT.  In the meantime, I spoke with son who verbalized that his dad was "normal" in his speech last evening and that he noted a difference today.  Son also confirms that his mom has dementia and cannot take care of his dad, the patient. [name]. with history of mild dementia per notes.  Wife clearly is having memory issues and difficulty understanding my information regarding IP Rehab.  Admissions coordinator will follow up with son to determine his intent and availability of 24 hour care for his dad , as Dr. Naaman Plummer anticipates [name]. will require 24 hour care. I have discussed with Elissa Hefty, CM and Jenna Holomon, SW.  Plan is for SNF back up in the event that the son will not be able to provide for the 24 hour needs of his dad.  Please call if questions.  [place] Admissions Coordinator Cell [phone] Office [phone]

## 2015-05-22 ENCOUNTER — Inpatient Hospital Stay (HOSPITAL_COMMUNITY): Payer: Medicare Other

## 2015-05-22 DIAGNOSIS — S065X9A Traumatic subdural hemorrhage with loss of consciousness of unspecified duration, initial encounter: Secondary | ICD-10-CM | POA: Diagnosis present

## 2015-05-22 DIAGNOSIS — W19XXXA Unspecified fall, initial encounter: Secondary | ICD-10-CM | POA: Diagnosis present

## 2015-05-22 DIAGNOSIS — R4182 Altered mental status, unspecified: Secondary | ICD-10-CM

## 2015-05-22 DIAGNOSIS — S065XAA Traumatic subdural hemorrhage with loss of consciousness status unknown, initial encounter: Secondary | ICD-10-CM | POA: Diagnosis present

## 2015-05-22 LAB — CBC
HCT: 33.6 % — ABNORMAL LOW (ref 39.0–52.0)
HEMATOCRIT: 32.7 % — AB (ref 39.0–52.0)
HEMOGLOBIN: 10.7 g/dL — AB (ref 13.0–17.0)
Hemoglobin: 11.1 g/dL — ABNORMAL LOW (ref 13.0–17.0)
MCH: 33.7 pg (ref 26.0–34.0)
MCH: 33.5 pg (ref 26.0–34.0)
MCHC: 33 g/dL (ref 30.0–36.0)
MCHC: 32.7 g/dL (ref 30.0–36.0)
MCV: 102.1 fL — ABNORMAL HIGH (ref 78.0–100.0)
MCV: 102.5 fL — ABNORMAL HIGH (ref 78.0–100.0)
PLATELETS: 104 10*3/uL — AB (ref 150–400)
Platelets: 88 10*3/uL — ABNORMAL LOW (ref 150–400)
RBC: 3.29 MIL/uL — ABNORMAL LOW (ref 4.22–5.81)
RBC: 3.19 MIL/uL — AB (ref 4.22–5.81)
RDW: 15.5 % (ref 11.5–15.5)
RDW: 15.5 % (ref 11.5–15.5)
WBC: 3.8 10*3/uL — ABNORMAL LOW (ref 4.0–10.5)
WBC: 4.4 10*3/uL (ref 4.0–10.5)

## 2015-05-22 LAB — GLUCOSE, CAPILLARY
GLUCOSE-CAPILLARY: 286 mg/dL — AB (ref 65–99)
GLUCOSE-CAPILLARY: 141 mg/dL — AB (ref 65–99)
GLUCOSE-CAPILLARY: 199 mg/dL — AB (ref 65–99)
Glucose-Capillary: 117 mg/dL — ABNORMAL HIGH (ref 65–99)
Glucose-Capillary: 242 mg/dL — ABNORMAL HIGH (ref 65–99)
Glucose-Capillary: 132 mg/dL — ABNORMAL HIGH (ref 65–99)

## 2015-05-22 LAB — RENAL FUNCTION PANEL
ALBUMIN: 3.7 g/dL (ref 3.5–5.0)
ANION GAP: 12 (ref 5–15)
BUN: 53 mg/dL — ABNORMAL HIGH (ref 6–20)
CO2: 26 mmol/L (ref 22–32)
Calcium: 8.7 mg/dL — ABNORMAL LOW (ref 8.9–10.3)
Chloride: 98 mmol/L — ABNORMAL LOW (ref 101–111)
Creatinine, Ser: 7.91 mg/dL — ABNORMAL HIGH (ref 0.61–1.24)
GFR, EST AFRICAN AMERICAN: 7 mL/min — AB (ref >60)
GFR, EST NON AFRICAN AMERICAN: 6 mL/min — AB (ref >60)
Glucose, Bld: 200 mg/dL — ABNORMAL HIGH (ref 65–99)
PHOSPHORUS: 5.8 mg/dL — AB (ref 2.5–4.6)
POTASSIUM: 4.3 mmol/L (ref 3.5–5.1)
Sodium: 136 mmol/L (ref 135–145)

## 2015-05-22 MED ORDER — PENTAFLUOROPROP-TETRAFLUOROETH EX AERO: 1.0000 "application " | INHALATION_SPRAY | CUTANEOUS | Status: DC | PRN

## 2015-05-22 MED ORDER — LIDOCAINE-PRILOCAINE 2.5-2.5 % EX CREA
1.0000 | TOPICAL_CREAM | CUTANEOUS | Status: DC | PRN
Start: 2015-05-22 — End: 2015-05-23
  Filled 2015-05-22: qty 5

## 2015-05-22 MED ORDER — LACOSAMIDE 50 MG PO TABS
100.0000 mg | ORAL_TABLET | Freq: Two times a day (BID) | ORAL | Status: DC
  Administered 2015-05-22 – 2015-05-23 (×3): 100 mg via ORAL
  Filled 2015-05-22 (×3): qty 2

## 2015-05-22 MED ORDER — SODIUM CHLORIDE 0.9 % IV SOLN: 100.0000 mL | INTRAVENOUS | Status: DC | PRN

## 2015-05-22 MED ORDER — HEPARIN SODIUM (PORCINE) 1000 UNIT/ML DIALYSIS: 1000.0000 [IU] | INTRAMUSCULAR | Status: DC | PRN

## 2015-05-22 MED ORDER — ALTEPLASE 2 MG IJ SOLR
2.0000 mg | Freq: Once | INTRAMUSCULAR | Status: DC | PRN
  Filled 2015-05-22: qty 2

## 2015-05-22 MED ORDER — LIDOCAINE HCL (PF) 1 % IJ SOLN: 5.0000 mL | INTRAMUSCULAR | Status: DC | PRN

## 2015-05-22 MED ORDER — SODIUM CHLORIDE 0.9 % IV SOLN
100.0000 mL | INTRAVENOUS | Status: DC | PRN
Start: 2015-05-22 — End: 2015-05-23

## 2015-05-22 NOTE — Progress Notes (Signed)
Patient ID: [name], male   DOB: [birth year], 81 y.o.   MRN: [id number] Results of MRI and CT noted. No change in left temporal subdural still minimal mass effect however certainly could be irritating enough to cause seizures however in this patient I do not think this warrants any type of surgical intervention at this time. Recommend continue stroke workup continue antiepileptics and observe.

## 2015-05-22 NOTE — Progress Notes (Signed)
I met with pt and his wife at bedside and then contacted his son, [name], by phone. I will seek insurance approval for an inpt rehab stay tomorrow, but if insurance does not approve or bed unavailable, they wish to seek SNF at [facility]. [phone]

## 2015-05-22 NOTE — Progress Notes (Addendum)
CSW initiated bed search on 9/14 and bed offers given on 9/15- full assessment to follow.  Merlyn Lot, LCSWA Clinical Social Worker [phone]

## 2015-05-22 NOTE — Procedures (Signed)
EEG report.  Brief clinical history: 81 y.o. male with new onset expressive and receptive aphasia. Att his time cannot exclude possible CVA versus effects of SDH with possible partial seizure manifestations, especially with SDH affecting the left hemisphere.    Technique: this is a 17 channel routine scalp EEG performed at the bedside with bipolar and monopolar montages arranged in accordance to the international 10/20 system of electrode placement. One channel was dedicated to EKG recording.  No sleep was obtained during recording. Intermittent photic stimulation were utilized as activating procedure.  Description:In the wakeful state, the best background consisted of a medium amplitude, posterior dominant, well sustained, symmetric and reactive 9 Hz rhythm. Frequent, intermittent runs of high amplitude polymorphic delta-theta activity and bi frontal FIRDA was noted. No focal or generalized epileptiform discharges noted.  No electrographic seizures noted. Intermittent photic stimulation did not induce a driving response.  EKG showed sinus rhythm.  Impression: this is an abnormal EEG with findings consistent with a mild to moderate encephalopathy as can be seen in a wide variety of entities including dementia. Please, be aware that a normal EEG does not exclude the possibility of epilepsy.  Clinical correlation is advised.   Wyatt Portela, MD [facility].

## 2015-05-22 NOTE — Progress Notes (Signed)
STROKE TEAM PROGRESS NOTE  HPI [name] is an 81 y.o. male with history of dementia, ESRD on Dialysis, DM, HTN, seizures well controlled on Vimpat 50 mg BID. He was admitted to hospital secondary to mechanical fall due to knee giving out resulting in 2 SDH. Follow up head CT on 9/14 shows no change in SDH. Today patient went to HD and following hemodialysis he was noted to have word finding difficulties greater than baseline. Neurology was asked to see patient.   Date last known well: Date: 05/21/2015 Time last known well: Time: 14:00 tPA Given: No: SDH   SUBJECTIVE (INTERVAL HISTORY) No family is at the bedside.  Overall he feels his condition is stable. Had no seizure activity since the aphasia episode. He is on home vimpat 50mg  bid and due to the aphasia episode consistent with seizure due to left SDH, will increase vimpat to 100mg  bid. Discussed with pharmacy and they are ok with the dosing in HD pt. Also MRI showed left high convexity possible infarct, but after discuss with Dr. from radiology, it looks like blood product. No infarct suspected at this time.   OBJECTIVE Temp:  [97.5 F (36.4 C)-98.2 F (36.8 C)] 97.9 F (36.6 C) (09/15 0800) Pulse Rate:  [73-80] 73 (09/15 0800) Cardiac Rhythm:  [-] Normal sinus rhythm (09/15 0730) Resp:  [12-17] 16 (09/15 0800) BP: (110-156)/(55-73) 144/64 mmHg (09/15 0800) SpO2:  [99 %-100 %] 99 % (09/15 0800) Weight:  [91.6 kg (201 lb 15.1 oz)-92.4 kg (203 lb 11.3 oz)] 92.4 kg (203 lb 11.3 oz) (09/15 0501)  CBC:  Recent Labs Lab 05/19/15 0920  05/21/15 0815 05/22/15 0238  WBC 4.1  < > 3.7* 3.8*  NEUTROABS 3.0  --  2.0  --   HGB 12.1*  < > 10.4* 11.1*  HCT 36.2*  < > 30.9* 33.6*  MCV 102.8*  < > 102.3* 102.1*  PLT 96*  < > 95* 104*  < > = values in this interval not displayed.  Basic Metabolic Panel:  Recent Labs Lab 05/19/15 2105 05/20/15 0313 05/21/15 0815  NA  --  136 138  K  --  4.3 4.5  CL  --  96* 101  CO2  --   25 24  GLUCOSE  --  152* 118*  BUN  --  52* 67*  CREATININE  --  7.05* 9.13*  CALCIUM  --  9.0 8.5*  MG 2.2  --   --   PHOS  --  5.1* 6.2*    Lipid Panel:    Component Value Date/Time   CHOL 111 05/20/2015 0313   CHOL 107 05/13/2015 0925   TRIG 63 05/20/2015 0313   HDL 53 05/20/2015 0313   HDL 66 05/13/2015 0925   CHOLHDL 2.1 05/20/2015 0313   CHOLHDL 1.6 05/13/2015 0925   VLDL 13 05/20/2015 0313   LDLCALC 45 05/20/2015 0313   LDLCALC 31 05/13/2015 0925   HgbA1c:  Lab Results  Component Value Date   HGBA1C 5.0 05/13/2015   Urine Drug Screen: No results found for: LABOPIA, COCAINSCRNUR, LABBENZ, AMPHETMU, THCU, LABBARB    IMAGING  I have personally reviewed the radiological images below and agree with the radiology interpretations. Blue text is my interpretation.  Ct Head Wo Contrast 05/21/2015    1. Stable to slight increase in diffuse subdural hematoma surrounding the left hemisphere.  2. Mild sulcal effacement without significant midline shift.  3. Stable mild diffuse white matter disease.     Ct  Head Wo Contrast 05/20/2015    Not significantly enlarged, but rather more apparent left supratentorial subdural hematoma, tracking along the dural reflections. No evidence of midline shift.    Mr [name] Head Wo Contrast 05/21/2015    1. Stable appearance of bilateral subdural hematomas, left greater than right.  2. Possible area of acute nonhemorrhagic infarct within the left parietal lobe along the precentral sulcus. However, discussed with Dr. Grace Isaac in person, the DWI changes most consistent with blood product in that location which can be seen well on GRE sequences. No acute infarct suspected at this time. 3. No other acute infarct.  4. Moderate atrophy and diffuse white matter change bilaterally compatible with chronic small vessel ischemic change.  5. MRA demonstrates no significant proximal stenosis, aneurysm, or branch vessel occlusion.  6. Mild diffuse small vessel  disease is present on the MRA a as well.      PHYSICAL EXAM  Temp:  [97.5 F (36.4 C)-98.2 F (36.8 C)] 97.8 F (36.6 C) (09/15 1151) Pulse Rate:  [73-81] 79 (09/15 1300) Resp:  [13-17] 16 (09/15 1300) BP: (136-156)/(55-68) 147/58 mmHg (09/15 1300) SpO2:  [99 %-100 %] 100 % (09/15 1300) Weight:  [203 lb 11.3 oz (92.4 kg)] 203 lb 11.3 oz (92.4 kg) (09/15 0501)  General - Well nourished, well developed, in no apparent distress.  Ophthalmologic - Fundi not visualized due to eye movement.  Cardiovascular - Regular rate and rhythm with no murmur.  Mental Status -  Level of arousal and orientation to month, place, and person were intact, but not to month. Language including expression, naming, repetition, comprehension was assessed and found intact. Fund of Knowledge was assessed and was impaired without knowing past presidents.  Cranial Nerves II - XII - II - Visual field intact OU. III, IV, VI - Extraocular movements intact. V - Facial sensation intact bilaterally. VII - Facial movement intact bilaterally. VIII - Hearing & vestibular intact bilaterally. X - Palate elevates symmetrically. XI - Chin turning & shoulder shrug intact bilaterally. XII - Tongue protrusion intact.  Motor Strength - The patient's strength was normal in all extremities and pronator drift was absent.  Bulk was normal and fasciculations were absent.   Motor Tone - Muscle tone was assessed at the neck and appendages and was normal.  Reflexes - The patient's reflexes were 1+ in all extremities and he had no pathological reflexes.  Sensory - Light touch, temperature/pinprick were assessed and were symmetrical.    Coordination - The patient had normal movements in the hands and feet with no ataxia or dysmetria.  Tremor was absent.  Gait and Station - deferred to PT/OT.  ASSESSMENT/PLAN Mr. [name] is a 81 y.o. male with history of dementia, ESRD on Dialysis, DM, HTN, seizures on vimpat presenting  with fall, SDH, and word finding difficulties. He did not receive IV t-PA due to SDH.  Seizure, due to acute left SDH  Aphasia episode resolved, likely due to left SDH  On home vimpat 50mg  bid for previous seizures with abnormal EEG  Concerning for new breaking through seizure episode this time in the setting of SDH  EEG pending  Will increase vimpat to 100mg  bid  Due to HD, discussed with pharmacy, they are OK with the vimpat 100mg  bid dosing  Pt follows with Dr. in [facility] for seizure  Will setup follow up with Dr. in one month   SDH due to fall - no acute infarct  Resultant  Aphasia episode concerning  for seizure  MRI - Stable appearance of bilateral subdural hematomas, left greater than right. MRI reported possible punctate infarct actually DWI changes due to blood product seen on GRE sequence. No acute infarct.    MRA - no significant proximal stenosis, aneurysm, or branch vessel occlusion.   CT stable SDH  LDL 45  HgbA1c 5.0  VTE prophylaxis - SCDs  Diet renal with fluid restriction Fluid restriction:: 1200 mL Fluid; Room service appropriate?: Yes; Fluid consistency:: Thin  no antithrombotic prior to admission, now on no antithrombotic secondary to subdural hematoma.  Ongoing aggressive stroke risk factor management  Therapy recommendations: CIR - Dr Riley Kill following.  Disposition: Pending  ESRD on HD  Nephrology following  Continue HD  vimpat doing OK as per pharmacy  Hypertension  Stable  BP goal < 160  Diabetes  HgbA1c 5.0, goal < 7.0  Controlled  Other Stroke Risk Factors  Advanced age  Other Active Problems  Pancytopenia  Fall risk  Hospital day # 3  Neurology will sign off. Please call with questions. Pt will follow up with Dr. Marjory Lies at [facility] in about 1 month. Thanks for the consult.  Marvel Plan, MD PhD Stroke Neurology 05/22/2015 2:33 PM     To contact Stroke Continuity provider, please refer to  [email or web address]. After hours, contact General Neurology

## 2015-05-22 NOTE — Progress Notes (Signed)
Admission note:  Arrival Method: Patient transferred from [facility] in w/c accompanied by staff, wife and son. Mental Orientation:  Alert and oriented x 4. Telemetry: N/A Assessment: See doc flow sheets. Skin: Warm, dry and intact.  Laceration on the back of head not visible but tender to touch.  No open areas or any discoloration noted.  Right great toe partially amputated, old.  Right lower leg graft healed, Old. Assessed by two nurses Bella Kennedy). IV: Left Hand SL. Pain: Denies any pain currently. Tubes: N/A Safety Measures: Bed in low position, bed alarm, phone and call light within reach. Fall Prevention Safety Plan: Reviewed the safety plan with patient, understood and acknowledged. Admission Screening: Completed. 6700 Orientation: Patient has been oriented to the unit, staff and to the room.

## 2015-05-22 NOTE — Progress Notes (Signed)
Assessment/Plan: 1. 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 ESRD - MWF HD on schedule for Friday 3. Hypertension/volume - getting below EDW, BP comes down during treatments 4. Anemia - Hgb 12 - hold ESA - continue Fe repletion 5. Nutrition -renal diet/vits 6. Thrombocytopenia - chronic 7. Seizure disorder -  8. Gout - on colchicine 9. DM - per primary 10. Dialysis noncompliance- due to back pain   Subjective: Interval History: We found Dr. Eliezer Bottom JAMA 1994 article entitled "Effects of high intensity training"....on osteoporosis!  Objective: Vital signs in last 24 hours: Temp:  [97.5 F (36.4 C)-98.2 F (36.8 C)] 97.9 F (36.6 C) (09/15 0800) Pulse Rate:  [73-80] 73 (09/15 0800) Resp:  [12-17] 16 (09/15 0800) BP: (110-156)/(55-68) 138/61 mmHg (09/15 0939) SpO2:  [99 %-100 %] 99 % (09/15 0800) Weight:  [91.6 kg (201 lb 15.1 oz)-92.4 kg (203 lb 11.3 oz)] 92.4 kg (203 lb 11.3 oz) (09/15 0501) Weight change: -0.7 kg (-1 lb 8.7 oz)  Intake/Output from previous day: 09/14 0701 - 09/15 0700 In: 243 [P.O.:240; I.V.:3] Out: 1000  Intake/Output this shift: Total I/O In: 240 [P.O.:240] Out: -   General appearance: alert and cooperative Resp: clear to auscultation bilaterally Chest wall: no tenderness Cardio: regular rate and rhythm, S1, S2 normal, no murmur, click, rub or gallop Extremities: edema none  Lab Results:  Recent Labs  05/21/15 0815 05/22/15 0238  WBC 3.7* 3.8*  HGB 10.4* 11.1*  HCT 30.9* 33.6*  PLT 95* 104*   BMET:  Recent Labs  05/20/15 0313 05/21/15 0815  NA 136 138  K 4.3 4.5  CL 96* 101  CO2 25 24  GLUCOSE 152* 118*  BUN 52* 67*  CREATININE 7.05* 9.13*  CALCIUM 9.0 8.5*   No results for input(s): PTH in the last 72 hours. Iron Studies: No results for input(s): IRON, TIBC, TRANSFERRIN, FERRITIN in the last 72 hours. Studies/Results: Ct Head Wo  Contrast  05/21/2015   CLINICAL DATA:  Altered mental status.  Subdural hematoma.  EXAM: CT HEAD WITHOUT CONTRAST  TECHNIQUE: Contiguous axial images were obtained from the base of the skull through the vertex without intravenous contrast.  COMPARISON:  CT head without contrast 05/20/2015  FINDINGS: Extensive subdural blood over the left hemisphere is similar to just slightly increased compared to the prior study. Maximal measurement along the posterior falx is 2.5 mm. Maximal measurement adjacent to the left temporal lobe is 9.5 mm.  There is no significant midline shift. There is slight sulcal effacement.  Moderate generalized white matter disease is otherwise stable.  The basal ganglia are intact. The insular ribbon is normal bilaterally.  IMPRESSION: 1. Stable to slight increase in diffuse subdural hematoma surrounding the left hemisphere. 2. Mild sulcal effacement without significant midline shift. 3. Stable mild diffuse white matter disease.   Electronically Signed   By: Marin Roberts M.D.   On: 05/21/2015 15:55   Ct Head Wo Contrast  05/20/2015   CLINICAL DATA:  Subdural hematoma.  EXAM: CT HEAD WITHOUT CONTRAST  TECHNIQUE: Contiguous axial images were obtained from the base of the skull through the vertex without intravenous contrast.  COMPARISON:  05/19/2015  FINDINGS: Again identified is 8 mm in maximum thickness left temporoparietal subdural hematoma. Blood products are again seen along the left side of falx cerebri. Subdural hematoma is now apparent along the left tentorium.  No evidence of a mass effect or  midline shift. No evidence of acute infarction. Again seen are chronic small vessel white matter ischemic changes, and brain parenchymal atrophy. Basal ganglia calcifications also seen. Basal cisterns are preserved.  No depressed skull fractures. Small amount fluid within the right maxillary sinus. Visualized paranasal sinuses and mastoid air cells are otherwise not opacified.  IMPRESSION:  Not significantly enlarged, but rather more apparent left supratentorial subdural hematoma, tracking along the dural reflections. No evidence of midline shift.   Electronically Signed   By: Ted Mcalpine M.D.   On: 05/20/2015 12:52   Mr Maxine Glenn Head Wo Contrast  05/21/2015   CLINICAL DATA:  Recent fall. Bilateral subdural hematomas today. Word-finding difficulties during dialysis today.  EXAM: MRI HEAD WITHOUT CONTRAST  MRA HEAD WITHOUT CONTRAST  TECHNIQUE: Multiplanar, multiecho pulse sequences of the brain and surrounding structures were obtained without intravenous contrast. Angiographic images of the head were obtained using MRA technique without contrast.  COMPARISON:  CT head without contrast from the same day.  FINDINGS: MRI HEAD FINDINGS  Extensive subdural collection is present over the left hemisphere measuring maximally 8.5 mm along the posterior aspect of the left temporal lobe. Subdural blood extends along the falx. The smaller posterior subdural collection is evident posterior to the right occipital lobe.  The diffusion-weighted images demonstrate a potential area of acute infarction within the left post central gyrus. No other acute infarct is present.  Advanced atrophy and diffuse white matter changes are present bilaterally. The ventricles are proportionate to the degree of atrophy. No significant midline shift is present. There is mass effect with effacement of the sulci on the left, particularly over the temporal lobe. Slight effacement of the posterior horn of the left lateral ventricle is noted.  Dilated perivascular spaces are present in the basal ganglia. The brainstem is intact. The cerebellum is unremarkable. The internal auditory canals are within normal limits bilaterally.  Flow is present in the major intracranial arteries. Bilateral lens replacements are present. The globes and orbits are otherwise intact.  Mild mucosal thickening is present in the maxillary sinuses and anterior  ethmoid air cells bilaterally. Minimal frontal sinus mucosal thickening is present. The sphenoid sinuses and mastoid air cells are clear. The skullbase is within normal limits. Midline images demonstrate degenerative changes in the upper cervical spine.  MRA HEAD FINDINGS  There is mild irregularity in the supra clinoid internal carotid arteries bilaterally without significant stenosis. The A1 and M1 segments are normal. The anterior communicating artery is patent. The MCA bifurcations are within normal limits. There is some attenuation of distal MCA branch vessels bilaterally without a significant proximal stenosis or occlusion.  The vertebral arteries are codominant. The left PICA origin is visualized and normal. The right PICA is not visualized. The right AICA is dominant. The basilar artery is within normal limits. Both posterior cerebral arteries originate from the basilar tip. There is some attenuation of distal PCA branch vessels without a significant proximal stenosis or occlusion.  IMPRESSION: 1. Stable appearance of bilateral subdural hematomas, left greater than right. 2. Possible area of acute nonhemorrhagic infarct within the left parietal lobe along the precentral sulcus. 3. No other acute infarct. 4. Moderate atrophy and diffuse white matter change bilaterally compatible with chronic small vessel ischemic change. 5. MRA demonstrates no significant proximal stenosis, aneurysm, or branch vessel occlusion. 6. Mild diffuse small vessel disease is present on the MRA a as well.   Electronically Signed   By: Marin Roberts M.D.   On: 05/21/2015 19:41  Mr Brain Wo Contrast  05/21/2015   CLINICAL DATA:  Recent fall. Bilateral subdural hematomas today. Word-finding difficulties during dialysis today.  EXAM: MRI HEAD WITHOUT CONTRAST  MRA HEAD WITHOUT CONTRAST  TECHNIQUE: Multiplanar, multiecho pulse sequences of the brain and surrounding structures were obtained without intravenous contrast.  Angiographic images of the head were obtained using MRA technique without contrast.  COMPARISON:  CT head without contrast from the same day.  FINDINGS: MRI HEAD FINDINGS  Extensive subdural collection is present over the left hemisphere measuring maximally 8.5 mm along the posterior aspect of the left temporal lobe. Subdural blood extends along the falx. The smaller posterior subdural collection is evident posterior to the right occipital lobe.  The diffusion-weighted images demonstrate a potential area of acute infarction within the left post central gyrus. No other acute infarct is present.  Advanced atrophy and diffuse white matter changes are present bilaterally. The ventricles are proportionate to the degree of atrophy. No significant midline shift is present. There is mass effect with effacement of the sulci on the left, particularly over the temporal lobe. Slight effacement of the posterior horn of the left lateral ventricle is noted.  Dilated perivascular spaces are present in the basal ganglia. The brainstem is intact. The cerebellum is unremarkable. The internal auditory canals are within normal limits bilaterally.  Flow is present in the major intracranial arteries. Bilateral lens replacements are present. The globes and orbits are otherwise intact.  Mild mucosal thickening is present in the maxillary sinuses and anterior ethmoid air cells bilaterally. Minimal frontal sinus mucosal thickening is present. The sphenoid sinuses and mastoid air cells are clear. The skullbase is within normal limits. Midline images demonstrate degenerative changes in the upper cervical spine.  MRA HEAD FINDINGS  There is mild irregularity in the supra clinoid internal carotid arteries bilaterally without significant stenosis. The A1 and M1 segments are normal. The anterior communicating artery is patent. The MCA bifurcations are within normal limits. There is some attenuation of distal MCA branch vessels bilaterally without a  significant proximal stenosis or occlusion.  The vertebral arteries are codominant. The left PICA origin is visualized and normal. The right PICA is not visualized. The right AICA is dominant. The basilar artery is within normal limits. Both posterior cerebral arteries originate from the basilar tip. There is some attenuation of distal PCA branch vessels without a significant proximal stenosis or occlusion.  IMPRESSION: 1. Stable appearance of bilateral subdural hematomas, left greater than right. 2. Possible area of acute nonhemorrhagic infarct within the left parietal lobe along the precentral sulcus. 3. No other acute infarct. 4. Moderate atrophy and diffuse white matter change bilaterally compatible with chronic small vessel ischemic change. 5. MRA demonstrates no significant proximal stenosis, aneurysm, or branch vessel occlusion. 6. Mild diffuse small vessel disease is present on the MRA a as well.   Electronically Signed   By: Marin Roberts M.D.   On: 05/21/2015 19:41    Scheduled: . amLODipine  10 mg Oral Daily  . calcitRIOL  1 mcg Oral Q M,W,F-HD  . calcium acetate  667 mg Oral TID WC  . folic acid  1 mg Oral Daily  . gabapentin  100 mg Oral Daily  . insulin aspart  0-9 Units Subcutaneous 6 times per day  . lacosamide  100 mg Oral BID  . multivitamin  1 tablet Oral QHS  . pantoprazole  40 mg Oral Q0600     LOS: 3 days   Lamine Laton C 05/22/2015,9:45 AM

## 2015-05-22 NOTE — Clinical Social Work Placement (Addendum)
   CLINICAL SOCIAL WORK PLACEMENT  NOTE  Date:  05/22/2015  Patient Details  Name: [name] MRN: [id number] Date of Birth: 1931/09/19  Clinical Social Work is seeking post-discharge placement for this patient at the Skilled  Nursing Facility level of care (*CSW will initial, date and re-position this form in  chart as items are completed):  Yes   Patient/family provided with [employer] Clinical Social Work Department's list of facilities offering this level of care within the geographic area requested by the patient (or if unable, by the patient's family).  Yes   Patient/family informed of their freedom to choose among providers that offer the needed level of care, that participate in Medicare, Medicaid or managed care program needed by the patient, have an available bed and are willing to accept the patient.  Yes   Patient/family informed of [place]'s ownership interest in [facility] and [facility], as well as of the fact that they are under no obligation to receive care at these facilities.  PASRR submitted to EDS on 05/21/15     PASRR number received on 05/21/15     Existing PASRR number confirmed on       FL2 transmitted to all facilities in geographic area requested by pt/family on 05/21/15     FL2 transmitted to all facilities within larger geographic area on       Patient informed that his/her managed care company has contracts with or will negotiate with certain facilities, including the following:        Yes   Patient/family informed of bed offers received.  Patient chooses bed at       Physician recommends and patient chooses bed at      Patient to be transferred to   on  .  Patient to be transferred to facility by       Patient family notified on   of transfer.  Name of family member notified:        PHYSICIAN Please sign FL2     Additional Comment:    _______________________________________________ [name], LCSW 05/22/2015,  4:41 PM

## 2015-05-22 NOTE — Care Management Important Message (Signed)
Important Message  Patient Details  Name: [name] MRN: [id number] Date of Birth: [birth year]   Medicare Important Message Given:  Yes-second notification given    Kyla Balzarine 05/22/2015, 3:37 PM

## 2015-05-22 NOTE — Progress Notes (Signed)
EEG completed, results pending. 

## 2015-05-22 NOTE — Progress Notes (Signed)
Physical Therapy Treatment Patient Details Name: [name] MRN: [id number] DOB: [birth year] Today's Date: 05/22/2015    History of Present Illness This a 81 y.o. malewith PMHx: HTN, DM , ESRD, and seizure disorder presenting to the ED after he sustained a fall secondary to left knee pain and left knee giving out as he was started to turn around with his walker on the way from his room to the bathroom and tripped and subsequently fell hitting the back of his head. CT: An 8 mm left temporoparietal subdural hematoma and a 4 mm upper left parafalcine subdural hematoma    PT Comments    Pt admitted with above diagnosis. Pt currently with functional limitations due to balance and endurance deficits. Pt was able to ambulate today.  Less confused.  Still with issues with safety even with RW but responds well to cuing.   Pt will benefit from skilled PT to increase their independence and safety with mobility to allow discharge to the venue listed below.    Follow Up Recommendations  CIR;Supervision/Assistance - 24 hour     Equipment Recommendations  Other (comment) (TBA)    Recommendations for Other Services Rehab consult     Precautions / Restrictions Precautions Precautions: Fall Restrictions Weight Bearing Restrictions: No    Mobility  Bed Mobility Overal bed mobility: Needs Assistance Bed Mobility: Supine to Sit     Supine to sit: Min guard        Transfers Overall transfer level: Needs assistance Equipment used: Rolling walker (2 wheeled) Transfers: Sit to/from Stand Sit to Stand: Min assist         General transfer comment: A little assist needed to power up. Cues for hand placement.   Ambulation/Gait Ambulation/Gait assistance: Min assist Ambulation Distance (Feet): 110 Feet Assistive device: Rolling walker (2 wheeled) Gait Pattern/deviations: Step-through pattern;Decreased stride length;Drifts right/left;Trunk flexed;Staggering left;Staggering right   Gait  velocity interpretation: Below normal speed for age/gender General Gait Details: Pt having difficulty with turns needing assist and cues for safety with turns. Pt staggers occasionally needing steadying assist.     Stairs            Wheelchair Mobility    Modified Rankin (Stroke Patients Only)       Balance Overall balance assessment: Needs assistance;History of Falls Sitting-balance support: No upper extremity supported;Feet supported Sitting balance-Leahy Scale: Fair     Standing balance support: Bilateral upper extremity supported;During functional activity Standing balance-Leahy Scale: Poor Standing balance comment: Needs bil Ue support for balance and stability.              High level balance activites: Direction changes;Turns;Sudden stops High Level Balance Comments: Needed min assist for safety with RW.    Cognition Arousal/Alertness: Awake/alert Behavior During Therapy: WFL for tasks assessed/performed Overall Cognitive Status: No family/caregiver present to determine baseline cognitive functioning Area of Impairment: Orientation;Memory;Following commands;Safety/judgement;Problem solving Orientation Level: Disoriented to;Time;Situation   Memory: Decreased short-term memory Following Commands: Follows one step commands with increased time Safety/Judgement: Decreased awareness of safety   Problem Solving: Slow processing;Difficulty sequencing;Requires verbal cues;Requires tactile cues General Comments: Still slightly confused but much better.     Exercises      General Comments        Pertinent Vitals/Pain Pain Assessment: No/denies pain  VSS    Home Living                      Prior Function  PT Goals (current goals can now be found in the care plan section) Progress towards PT goals: Progressing toward goals    Frequency  Min 3X/week    PT Plan Current plan remains appropriate    Co-evaluation              End of Session Equipment Utilized During Treatment: Gait belt Activity Tolerance: Patient limited by fatigue Patient left: with call bell/phone within reach;in bed;with bed alarm set     Time: 5956-3875 PT Time Calculation (min) (ACUTE ONLY): 11 min  Charges:  $Gait Training: 8-22 mins                    G CodesBerline Lopes 05/23/15, 1:03 PM [employer] [facility] [phone] (pager)

## 2015-05-22 NOTE — Progress Notes (Signed)
[place] TEAM 1 - Stepdown/ICU TEAM Progress Note  Myrtle Barnhard CWC:[id number] DOB: May 22, 1932 DOA: 05/19/2015 PCP: [name], DO  Admit HPI / Brief Narrative: 81 y.o. BM PMHx hypertension, well-controlled diabetes mellitus type 2 with hemoglobin A1c of 5. 05/13/2015, ESRD on HD M/W/F at [facility] Seizure DO  Presenting to the ED after he sustained a fall secondary to left knee pain and left knee giving out as he was started to turn around with his walker on the way from his room to the bathroom and tripped over subsequently felt well and on the back of his head. Per family patient with no change in mental status with no significant change since fall. Patient denies any fevers, no chills, no nausea, no vomiting, no chest pain, no shortness of breath, no abdominal pain, no dysuria, no diarrhea, no constipation, no melena, no hematemesis, no hematochezia, no cough, no fever, no chills. Patient also complains of generalized weakness. No syncope. The patient was seen in the emergency room Patient was in the emergency room basic metabolic profile obtained had a potassium of 6.4 BUN of 121 and creatinine of 11.43 was also within normal limits. CBC obtained at a hemoglobin of 12.1 and a platelet count of 96 the rest was within normal limits.   HPI/Subjective: 9/15  A/O 4, negative headache, negative vision change, negative CP, negative SOB. Patient very good spirits joking with family and staff.  Assessment/Plan: Subdural hematoma 2 -9/13 CT head. Shows slight enlargement SDH. Although stating is not significant with patient's falling platelets will maintain patient in SDU. -QID neuro check -9/13 Transfused 1 unit platelets -Patient will be discharged to CIR vs SNF within next 24-48 hrs.  Hyperkalemia -Resolved with hemodialysis  hypertension Stable. Continue Norvasc.  ESRD on hemodialysis (M/W/F)  -Will need HD in the a.m.   Seizures -No noted seizures. Continue  home regimen of vimpat. Outpatient follow-up.   Fall -Mechanical in nature.  -PT/OT. Recommend CIR  Metabolic acidosis -Resolved, Defer to nephrology.  Thrombocytopenia chronic in nature -Platelets continue to be low but appear to have stabilized around 100,000. Continue to monitor closely   Diabetes mellitus Type controlled -6/9 Hemoglobin A1c= 5.0 -Lipid panel pending - Sensitive SSI  10 posterior scalp laceration -Status post sutures. Sutures to be removed in about 7-10 days.   Code Status: FULL Family Communication: Wife and son present at time of exam Disposition Plan: Per neurosurgery and nephrology    Consultants: Vernelle Emerald (neurosurgery) Burnett Kanaris (nephrology)   Procedure/Significant Events: 9/12 CT head without contrast/C-spine;-8 mm Lt temporoparietal subdural hematoma/ 4 mm upper Lt parafalcine subdural hematoma -negative midline shift, acute infarction,intraparenchymal hemorrhage, hydrocephalus or mass lesion. -C-spine negative for fracture, subluxation; ; moderate to severe DJD/spondylosis C3-C7  9/13 CT head without contrast; 8 mm in maximum thickness left temporoparietal SDH,more apparent left supratentorial subdural hematoma, tracking along the dural reflections. Not significantly enlarged 9/13 Transfuse 1 unit platelets    Culture   Antibiotics:   DVT prophylaxis: SCD   Devices    LINES / TUBES:      Continuous Infusions:   Objective: VITAL SIGNS: Temp: 97.8 F (36.6 C) (09/15 1151) Temp Source: Oral (09/15 1151) BP: 151/61 mmHg (09/15 1100) Pulse Rate: 81 (09/15 1151) SPO2; FIO2:   Intake/Output Summary (Last 24 hours) at 05/22/15 1236 Last data filed at 05/22/15 0900  Gross per 24 hour  Intake    240 ml  Output      0 ml  Net  240 ml     Exam: General: A/O 4, No acute respiratory distress Eyes: Negative headache, eye pain, double vision, pupils equal round reactive to light and accommodation negative  scleral hemorrhage ENT: Negative Runny nose, negative gingival bleeding, Neck:  Negative scars, masses, torticollis, lymphadenopathy, JVD Lungs: Clear to auscultation bilaterally without wheezes or crackles Cardiovascular: Regular rate and rhythm without murmur gallop or rub normal S1 and S2 Abdomen:negative abdominal pain, nondistended, positive soft, bowel sounds, no rebound, no ascites, no appreciable mass Extremities: No significant cyanosis, clubbing, or edema bilateral lower extremities Psychiatric:  Negative depression, negative anxiety, negative fatigue, negative mania  Neurologic:  Cranial nerves II through XII intact, tongue/uvula midline, all extremities muscle strength 5/5, sensation intact throughout, negative dysarthria, negative expressive aphasia, negative receptive aphasia.   Data Reviewed: Basic Metabolic Panel:  Recent Labs Lab 05/19/15 0920 05/19/15 2105 05/20/15 0313 05/21/15 0815  NA 140  --  136 138  K 6.4*  --  4.3 4.5  CL 104  --  96* 101  CO2 19*  --  25 24  GLUCOSE 126*  --  152* 118*  BUN 121*  --  52* 67*  CREATININE 11.43*  --  7.05* 9.13*  CALCIUM 9.8  --  9.0 8.5*  MG  --  2.2  --   --   PHOS  --   --  5.1* 6.2*   Liver Function Tests:  Recent Labs Lab 05/20/15 0313 05/21/15 0815  ALBUMIN 4.1 3.7   No results for input(s): LIPASE, AMYLASE in the last 168 hours. No results for input(s): AMMONIA in the last 168 hours. CBC:  Recent Labs Lab 05/19/15 0920 05/20/15 0313 05/21/15 0815 05/22/15 0238  WBC 4.1 5.2 3.7* 3.8*  NEUTROABS 3.0  --  2.0  --   HGB 12.1* 11.8* 10.4* 11.1*  HCT 36.2* 35.7* 30.9* 33.6*  MCV 102.8* 102.3* 102.3* 102.1*  PLT 96* 74* 95* 104*   Cardiac Enzymes: No results for input(s): CKTOTAL, CKMB, CKMBINDEX, TROPONINI in the last 168 hours. BNP (last 3 results) No results for input(s): BNP in the last 8760 hours.  ProBNP (last 3 results) No results for input(s): PROBNP in the last 8760  hours.  CBG:  Recent Labs Lab 05/21/15 1734 05/21/15 2112 05/22/15 0124 05/22/15 0359 05/22/15 0826  GLUCAP 259* 239* 286* 141* 117*    Recent Results (from the past 240 hour(s))  MRSA PCR Screening     Status: None   Collection Time: 05/19/15  6:12 PM  Result Value Ref Range Status   MRSA by PCR NEGATIVE NEGATIVE Final    Comment:        The GeneXpert MRSA Assay (FDA approved for NASAL specimens only), is one component of a comprehensive MRSA colonization surveillance program. It is not intended to diagnose MRSA infection nor to guide or monitor treatment for MRSA infections.      Studies:  Recent x-ray studies have been reviewed in detail by the Attending Physician  Scheduled Meds:  Scheduled Meds: . amLODipine  10 mg Oral Daily  . calcitRIOL  1 mcg Oral Q M,W,F-HD  . calcium acetate  667 mg Oral TID WC  . folic acid  1 mg Oral Daily  . gabapentin  100 mg Oral Daily  . insulin aspart  0-9 Units Subcutaneous 6 times per day  . lacosamide  100 mg Oral BID  . multivitamin  1 tablet Oral QHS  . pantoprazole  40 mg Oral Q0600    Time spent  on care of this patient: 40 mins   Darleth Eustache, Roselind Messier , MD  [facility] Office  [phone] Pager [phone]  On-Call/Text Page:      [email or web address]      password [email or web address]  If 7PM-7AM, please contact night-coverage [email or web address] Password [email or web address] 05/22/2015, 12:36 PM   LOS: 3 days   Care during the described time interval was provided by me .  I have reviewed this patient's available data, including medical history, events of note, physical examination, and all test results as part of my evaluation. I have personally reviewed and interpreted all radiology studies.   Carolyne Littles, MD [phone] Pager

## 2015-05-22 NOTE — Progress Notes (Signed)
Patient being transferred to 6E. Report called to receiving nurse. Wife at bedside informed of room number. [name] called and informed of transfer and room number.

## 2015-05-22 NOTE — Clinical Social Work Note (Addendum)
Clinical Social Work Assessment  Patient Details  Name: [name] MRN: [id number] Date of Birth: Aug 04, 1932  Date of referral:  05/22/15               Reason for consult:  Facility Placement                Permission sought to share information with:  Family Supports Permission granted to share information::  Yes, Verbal Permission Granted  Name::     Oncologist::  [place] [facility]  Relationship::  son  Contact Information:     Housing/Transportation Living arrangements for the past 2 months:  Single Family Home Source of Information:  Patient Patient Interpreter Needed:  None Criminal Activity/Legal Involvement Pertinent to Current Situation/Hospitalization:  No - Comment as needed Significant Relationships:  Adult Children Lives with:  Spouse Do you feel safe going back to the place where you live?  No Need for family participation in patient care:  Yes (Comment)  Care giving concerns:  Pt does not have sufficient support at home- lives with elderly wife and has son who is unable to provide needed assistance   Office manager / plan:  CSW spoke with pt, wife, and son concerning PT recommendation for CIR vs SNF  Employment status:  Retired Database administrator PT Recommendations:  Inpatient Rehab Consult Information / Referral to community resources:  Skilled Nursing Facility  Patient/Family's Response to care:  Pt family is agreeable to SNF if unable to go to [employer]  Patient/Family's Understanding of and Emotional Response to Diagnosis, Current Treatment, and Prognosis:  No questions or concerns  Emotional Assessment Appearance:  Appears stated age Attitude/Demeanor/Rapport:    Affect (typically observed):  Quiet Orientation:  Oriented to Self, Oriented to Place, Oriented to  Time, Oriented to Situation Alcohol / Substance use:  Not Applicable Psych involvement (Current and /or in the community):  No (Comment)  Discharge Needs   Concerns to be addressed:  Care Coordination Readmission within the last 30 days:  No Current discharge risk:  Physical Impairment Barriers to Discharge:  Continued Medical Work up   [name], LCSW 05/22/2015, 4:38 PM

## 2015-05-23 DIAGNOSIS — R278 Other lack of coordination: Secondary | ICD-10-CM | POA: Diagnosis not present

## 2015-05-23 DIAGNOSIS — Z9181 History of falling: Secondary | ICD-10-CM | POA: Diagnosis not present

## 2015-05-23 DIAGNOSIS — S065X0D Traumatic subdural hemorrhage without loss of consciousness, subsequent encounter: Secondary | ICD-10-CM | POA: Diagnosis not present

## 2015-05-23 DIAGNOSIS — Z794 Long term (current) use of insulin: Secondary | ICD-10-CM | POA: Diagnosis not present

## 2015-05-23 DIAGNOSIS — D631 Anemia in chronic kidney disease: Secondary | ICD-10-CM | POA: Diagnosis not present

## 2015-05-23 DIAGNOSIS — E119 Type 2 diabetes mellitus without complications: Secondary | ICD-10-CM | POA: Diagnosis not present

## 2015-05-23 DIAGNOSIS — G40909 Epilepsy, unspecified, not intractable, without status epilepticus: Secondary | ICD-10-CM | POA: Diagnosis not present

## 2015-05-23 DIAGNOSIS — I6200 Nontraumatic subdural hemorrhage, unspecified: Secondary | ICD-10-CM | POA: Diagnosis not present

## 2015-05-23 DIAGNOSIS — I120 Hypertensive chronic kidney disease with stage 5 chronic kidney disease or end stage renal disease: Secondary | ICD-10-CM | POA: Diagnosis not present

## 2015-05-23 DIAGNOSIS — M6281 Muscle weakness (generalized): Secondary | ICD-10-CM | POA: Diagnosis not present

## 2015-05-23 DIAGNOSIS — E1129 Type 2 diabetes mellitus with other diabetic kidney complication: Secondary | ICD-10-CM | POA: Diagnosis not present

## 2015-05-23 DIAGNOSIS — N189 Chronic kidney disease, unspecified: Secondary | ICD-10-CM | POA: Diagnosis not present

## 2015-05-23 DIAGNOSIS — I10 Essential (primary) hypertension: Secondary | ICD-10-CM | POA: Diagnosis not present

## 2015-05-23 DIAGNOSIS — D509 Iron deficiency anemia, unspecified: Secondary | ICD-10-CM | POA: Diagnosis not present

## 2015-05-23 DIAGNOSIS — S0101XD Laceration without foreign body of scalp, subsequent encounter: Secondary | ICD-10-CM | POA: Diagnosis not present

## 2015-05-23 DIAGNOSIS — N186 End stage renal disease: Secondary | ICD-10-CM | POA: Diagnosis not present

## 2015-05-23 DIAGNOSIS — R4789 Other speech disturbances: Secondary | ICD-10-CM | POA: Diagnosis not present

## 2015-05-23 DIAGNOSIS — D696 Thrombocytopenia, unspecified: Secondary | ICD-10-CM | POA: Diagnosis not present

## 2015-05-23 DIAGNOSIS — R2681 Unsteadiness on feet: Secondary | ICD-10-CM | POA: Diagnosis not present

## 2015-05-23 DIAGNOSIS — D638 Anemia in other chronic diseases classified elsewhere: Secondary | ICD-10-CM | POA: Diagnosis not present

## 2015-05-23 LAB — RENAL FUNCTION PANEL
ALBUMIN: 3.6 g/dL (ref 3.5–5.0)
ANION GAP: 11 (ref 5–15)
BUN: 58 mg/dL — AB (ref 6–20)
CHLORIDE: 101 mmol/L (ref 101–111)
CO2: 25 mmol/L (ref 22–32)
Calcium: 8.9 mg/dL (ref 8.9–10.3)
Creatinine, Ser: 8.58 mg/dL — ABNORMAL HIGH (ref 0.61–1.24)
GFR, EST AFRICAN AMERICAN: 6 mL/min — AB (ref >60)
GFR, EST NON AFRICAN AMERICAN: 5 mL/min — AB (ref >60)
Glucose, Bld: 144 mg/dL — ABNORMAL HIGH (ref 65–99)
PHOSPHORUS: 6.3 mg/dL — AB (ref 2.5–4.6)
POTASSIUM: 4.7 mmol/L (ref 3.5–5.1)
Sodium: 137 mmol/L (ref 135–145)

## 2015-05-23 LAB — GLUCOSE, CAPILLARY
GLUCOSE-CAPILLARY: 169 mg/dL — AB (ref 65–99)
GLUCOSE-CAPILLARY: 300 mg/dL — AB (ref 65–99)

## 2015-05-23 MED ORDER — OXYCODONE HCL 5 MG PO TABS
ORAL_TABLET | ORAL | Status: AC
  Filled 2015-05-23: qty 1

## 2015-05-23 MED ORDER — GABAPENTIN 100 MG PO CAPS: 100.0000 mg | ORAL_CAPSULE | Freq: Every day | ORAL | Status: DC

## 2015-05-23 MED ORDER — LACOSAMIDE 100 MG PO TABS: 100.0000 mg | ORAL_TABLET | Freq: Two times a day (BID) | ORAL | Status: DC

## 2015-05-23 MED ORDER — CALCITRIOL 0.5 MCG PO CAPS: 1.0000 ug | ORAL_CAPSULE | ORAL | Status: DC

## 2015-05-23 NOTE — Progress Notes (Addendum)
I do not have an inpt rehab bed for this pt today. I have contacted his son, Alroy Dust, by phone and he is aware. They then choose [facility] for rehab. I have notified SW/Eric. I met with pt in dialysis and he is aware also. [phone]

## 2015-05-23 NOTE — Progress Notes (Signed)
Tolerating hemodialysis.  More alert and appropriate.  Encouraged to do full prescribed time. POWELL,ALVIN C

## 2015-05-23 NOTE — Discharge Instructions (Signed)
Subdural Hematoma  A subdural hematoma is a collection of blood between the brain and its tough outermost membrane covering (the dura).  Blood clots that form in this area push down on the brain and cause irritation. A subdural hematoma may cause parts of the brain to stop working and eventually cause death.   CAUSES  A subdural hematoma is caused by bleeding from a ruptured blood vessel (hemorrhage). The bleeding results from trauma to the head, such as from a fall or motor vehicle accident.  There are two types of subdural hemorrhages:  · Acute. This type develops shortly after a serious blow to the head and causes blood to collect very quickly. If not diagnosed and treated promptly, severe brain injury or death can occur.  · Chronic. This is when bleeding develops more slowly, over weeks or months.  RISK FACTORS  People at risk for subdural hematoma include older persons, infants, and alcoholics.  SYMPTOMS  An acute subdural hemorrhage develops over minutes to hours. Symptoms can include:  · Temporary loss of consciousness.  · Weakness of arms or legs on one side of the body.  · Changes in vision or speech.  · A severe headache.  · Seizures.  · Nausea and vomiting.  · Increased sleepiness.  A chronic subdural hemorrhage develops over weeks to months. Symptoms may develop slowly and produce less noticeable problems or changes. Symptoms include:  · A mild headache.  · A change in personality.  · Loss of balance or difficulty walking.  · Weakness, numbness, or tingling in the arms or legs.  · Nausea or vomiting.  · Memory loss.  · Double vision.  · Increased sleepiness.  DIAGNOSIS  Your health care provider will perform a thorough physical and neurological exam. A CT scan or MRI may also be done. If there is blood on the scan, its color will help your health care provider determine how long the hemorrhage has been there.  TREATMENT  If the cause is an acute subdural hemorrhage, immediate treatment is needed. In many  cases an emergency surgery is performed to drain accumulated blood or to remove the blood clot. Sometimes steroid or diuretic medicines or controlled breathing through a ventilator is needed to decrease pressure in the brain. This is especially true if there is any swelling of the brain.  If the cause is a chronic subdural hemorrhage, treatment depends on a variety of factors. Sometimes no treatment is needed. If the subdural hematoma is small and causes minimal or no symptoms, you may be treated with bed rest, medicines, and observation. If the hemorrhage is large or if you have neurological symptoms, an emergency surgery is usually needed to remove the blood clot.  People who develop a subdural hemorrhage are at risk of developing seizures, even after the subdural hematoma has been treated. You may be prescribed an anti-seizure (anticonvulsant) medicine for a year or longer.  HOME CARE INSTRUCTIONS  · Only take medicines as directed by your health care provider.  · Rest if directed by your health care provider.  · Keep all follow-up appointments with your health care provider.  · If you play a contact sport such as football, hockey or soccer and you experienced a significant head injury, allow enough time for healing (up to 15 days) before you start playing again. A repeated injury that occurs during this fragile repair period is likely to result in hemorrhage. This is called the second impact syndrome.  SEEK IMMEDIATE MEDICAL CARE IF:  ·   You fall or experience minor trauma to your head and you are taking blood thinners. If you are on any blood thinners even a very small injury can cause a subdural hematoma. You should not hesitate to seek medical attention regardless of how minor you think your symptoms are.  · You experience a head injury and have:  ¨ Drowsiness or a decrease in alertness.  ¨ Confusion or forgetfulness.  ¨ Slurred speech.  ¨ Irrational or aggressive behavior.  ¨ Numbness or paralysis in any part  of the body.  ¨ A feeling of being sick to your stomach (nauseous) or you throw up (vomit).  ¨ Difficulty walking or poor coordination.  ¨ Double vision.  ¨ Seizures.  ¨ A bleeding disorder.  ¨ A history of heavy alcohol use.  ¨ Clear fluid draining from your nose or ears.  ¨ Personality changes.  ¨ Difficulty thinking.  ¨ Worsening symptoms.  MAKE SURE YOU:  · Understand these instructions.  · Will watch your condition.  · Will get help right away if you are not doing well or get worse.  FOR MORE INFORMATION  National Institute of Neurological Disorders and Stroke: www.ninds.nih.gov  American Association of Neurological Surgeons: www.neurosurgerytoday.org  American Academy of Neurology (AAN): www.aan.com  Brain Injury Association of America: www.biausa.org  Document Released: 07/10/2004 Document Revised: 06/13/2013 Document Reviewed: 02/23/2013  ExitCare® Patient Information ©2015 [employer]. This information is not intended to replace advice given to you by your health care provider. Make sure you discuss any questions you have with your health care provider.

## 2015-05-23 NOTE — Progress Notes (Addendum)
LCSW aware of patient and plan per call from [employer]. No bed with CIR, thus next alternative would be SNF with family choosing [facility].  LCSW called [facility] and left message regarding family choice. Per notes, unclear if patient discharging today or tomorrow. Clinicals resent to [facility] for review as requested. Will need to know if patient is coming by 3pm.  LCSW will continue to follow with disposition.  Deretha Emory, MSW Clinical Social Work: Emergency Room [phone]

## 2015-05-23 NOTE — Progress Notes (Signed)
OT Cancellation Note  Patient Details Name: [name] MRN: [id number] DOB: 1932/05/21   Cancelled Treatment:    Reason Eval/Treat Not Completed: Patient at procedure or test/ unavailable (HD)  Boone Master B 05/23/2015, 9:58 AM   Mateo Flow   OTR/L Pager: [phone] Office: [phone] .

## 2015-05-23 NOTE — Progress Notes (Signed)
Report called to RN at  [facility].Family to bring patient.Discharge papers already given by SW to family. [place], Drinda Butts, Charity fundraiser

## 2015-05-23 NOTE — Discharge Summary (Signed)
DISCHARGE SUMMARY  [name]  MR#: [id number]  DOB:20-Sep-1931  Date of Admission: 05/19/2015 Date of Discharge: 05/23/2015  Attending Physician:MCCLUNG,JEFFREY T  Patient's NGE:XBMW, [name], DO  Consults: Nephrology Neurosurgery Neurology  Disposition: D/C to SNF for a rehab stay   Follow-up Appts:     Follow-up Information    Follow up with PENUMALLI,VIKRAM, MD. Schedule an appointment as soon as possible for a visit in 1 month.   Specialties:  Neurology, Radiology   Contact information:   [address] [place] [address] [phone]      Tests Needing Follow-up: -suture will need to be removed from his posterior scalp in 3-4 days  Discharge Diagnoses: Subdural hematoma 2 Altered mentation / word finding difficulty history of seizures hyperkalemia hypertension ESRD on hemodialysis  Fall Thrombocytopenia chronic in nature  Diabetes mellitus posterior scalp laceration  Initial presentation: 81 y.o. M Hx hypertension, well-controlled diabetes mellitus type 2, ESRD, and Seizure DO who presented to the ED after he sustained a fall secondary to his left knee giving out as he was started to turn around with his walker. Per family patient with no change in mental status since fall.   CT head obtained in the emergency room revealed an 8 mm left temporoparietal subdural hematoma and a 4 mm left parafalcine subdural hematoma with no evidence of midline shift.  Hospital Course:  Subdural hematoma 2 Neurosurgery evaluated - follow-up CTs have all noted no significant enlargement in subdural hemorrhages   Altered mentation / word finding difficulty a new development afternoon of 9/14 - MRI did not reveal an acute CVA - MRA demonstrated no significant proximal stenosis, aneurysm, or branch vessel occlusion - sx felt to be related to L SDH likely inducing sz activity - vimpat increased to 100BID - to f/u w/ Dr. Marjory Lies   history of seizures See  discussion above - home medical tx adjusted   hyperkalemia Resolved with hemodialysis  hypertension Well controlled at present   ESRD on hemodialysis  Nephrology followed  Fall Mechanical in nature - PT/OT suggest rehab stay   thrombocytopenia chronic in nature Transfused platelets 9/13 in setting of subdural - platelet count w/o significant change   Diabetes mellitus A1c 5.0 02/13/15 - CBG well controlled  posterior scalp laceration Sutures to be removed in 7-10 days    Medication List    STOP taking these medications        doxercalciferol 4 MCG/2ML injection  Commonly known as:  HECTOROL     Tdap 5-2.5-18.5 LF-MCG/0.5 injection  Commonly known as:  BOOSTRIX     zoster vaccine live (PF) 19400 UNT/0.65ML injection  Commonly known as:  ZOSTAVAX      TAKE these medications        ACCU-CHEK AVIVA PLUS test strip  Generic drug:  glucose blood  Check blood sugar three times daily     amLODipine 10 MG tablet  Commonly known as:  NORVASC  Take 10 mg by mouth Daily.     BESIVANCE 0.6 % Susp  Generic drug:  Besifloxacin HCl  Place 1 drop into the right eye 4 (four) times daily as needed. For 2 days after each monthly injection     calcitRIOL 0.5 MCG capsule  Commonly known as:  ROCALTROL  Take 2 capsules (1 mcg total) by mouth every Monday, Wednesday, and Friday with hemodialysis.     calcium acetate 667 MG capsule  Commonly known as:  PHOSLO  Take 2,001 mg by mouth 3 (three) times  daily.     colchicine 0.6 MG tablet  as needed.     cyclobenzaprine 5 MG tablet  Commonly known as:  FLEXERIL  Take 5 mg by mouth daily as needed.     darbepoetin 60 MCG/0.3ML Soln injection  Commonly known as:  ARANESP  Inject 0.3 mLs (60 mcg total) into the vein every Thursday with hemodialysis.     fexofenadine 180 MG tablet  Commonly known as:  ALLEGRA  Take 180 mg by mouth daily. As needed     folic acid 1 MG tablet  Commonly known as:  FOLVITE  Take 1 tablet (1 mg  total) by mouth daily.     gabapentin 100 MG capsule  Commonly known as:  NEURONTIN  Take 1 capsule (100 mg total) by mouth daily.     HUMALOG KWIKPEN 100 UNIT/ML KiwkPen  Generic drug:  insulin lispro  Sliding Scale     Lacosamide 100 MG Tabs  Take 1 tablet (100 mg total) by mouth 2 (two) times daily.     multivitamin Tabs tablet  Take 1 tablet by mouth at bedtime.     NASACORT ALLERGY 24HR 55 MCG/ACT Aero nasal inhaler  Generic drug:  triamcinolone  Place 1 spray into the nose. One spray nasal as needed for allery     senna-docusate 8.6-50 MG per tablet  Commonly known as:  Senokot-S  Take 1 tablet by mouth. As needed     sodium chloride 0.9 % SOLN 100 mL with ferric gluconate 12.5 MG/ML SOLN 62.5 mg  Inject 62.5 mg into the vein every Thursday with hemodialysis.        Day of Discharge BP 137/56 mmHg  Pulse 76  Temp(Src) 97.6 F (36.4 C) (Oral)  Resp 18  Ht 6' (1.829 m)  Wt 91.1 kg (200 lb 13.4 oz)  BMI 27.23 kg/m2  SpO2 100%  Physical Exam: General: No acute respiratory distress Lungs: Clear to auscultation bilaterally without wheezes or crackles Cardiovascular: Regular rate and rhythm without murmur gallop or rub normal S1 and S2 Abdomen: Nontender, nondistended, soft, bowel sounds positive, no rebound, no ascites, no appreciable mass Extremities: No significant cyanosis, clubbing, or edema bilateral lower extremities  Basic Metabolic Panel:  Recent Labs Lab 05/19/15 0920 05/19/15 2105 05/20/15 0313 05/21/15 0815 05/22/15 2235 05/23/15 0854  NA 140  --  136 138 136 137  K 6.4*  --  4.3 4.5 4.3 4.7  CL 104  --  96* 101 98* 101  CO2 19*  --  25 24 26 25   GLUCOSE 126*  --  152* 118* 200* 144*  BUN 121*  --  52* 67* 53* 58*  CREATININE 11.43*  --  7.05* 9.13* 7.91* 8.58*  CALCIUM 9.8  --  9.0 8.5* 8.7* 8.9  MG  --  2.2  --   --   --   --   PHOS  --   --  5.1* 6.2* 5.8* 6.3*    Liver Function Tests:  Recent Labs Lab 05/20/15 0313  05/21/15 0815 05/22/15 2235 05/23/15 0854  ALBUMIN 4.1 3.7 3.7 3.6   CBC:  Recent Labs Lab 05/19/15 0920 05/20/15 0313 05/21/15 0815 05/22/15 0238 05/22/15 2230  WBC 4.1 5.2 3.7* 3.8* 4.4  NEUTROABS 3.0  --  2.0  --   --   HGB 12.1* 11.8* 10.4* 11.1* 10.7*  HCT 36.2* 35.7* 30.9* 33.6* 32.7*  MCV 102.8* 102.3* 102.3* 102.1* 102.5*  PLT 96* 74* 95* 104* 88*    CBG:  Recent Labs Lab 05/22/15 0826 05/22/15 1155 05/22/15 1742 05/22/15 2053 05/23/15 1124  GLUCAP 117* 199* 242* 132* 169*    Recent Results (from the past 240 hour(s))  MRSA PCR Screening     Status: None   Collection Time: 05/19/15  6:12 PM  Result Value Ref Range Status   MRSA by PCR NEGATIVE NEGATIVE Final    Comment:        The GeneXpert MRSA Assay (FDA approved for NASAL specimens only), is one component of a comprehensive MRSA colonization surveillance program. It is not intended to diagnose MRSA infection nor to guide or monitor treatment for MRSA infections.       Time spent in discharge (includes decision making & examination of pt): >35 minutes  05/23/2015, 2:34 PM   Lonia Blood, MD [facility] Office  [phone] Pager [phone]  On-Call/Text Page:      [email or web address]      password [facility]

## 2015-05-26 ENCOUNTER — Non-Acute Institutional Stay (SKILLED_NURSING_FACILITY): Payer: Medicare Other | Admitting: Internal Medicine

## 2015-05-26 ENCOUNTER — Encounter: Payer: Self-pay | Admitting: Internal Medicine

## 2015-05-26 DIAGNOSIS — G40909 Epilepsy, unspecified, not intractable, without status epilepticus: Secondary | ICD-10-CM | POA: Diagnosis not present

## 2015-05-26 DIAGNOSIS — D696 Thrombocytopenia, unspecified: Secondary | ICD-10-CM | POA: Diagnosis not present

## 2015-05-26 DIAGNOSIS — G629 Polyneuropathy, unspecified: Secondary | ICD-10-CM

## 2015-05-26 DIAGNOSIS — N186 End stage renal disease: Secondary | ICD-10-CM

## 2015-05-26 DIAGNOSIS — S065XAA Traumatic subdural hemorrhage with loss of consciousness status unknown, initial encounter: Secondary | ICD-10-CM

## 2015-05-26 DIAGNOSIS — I10 Essential (primary) hypertension: Secondary | ICD-10-CM

## 2015-05-26 DIAGNOSIS — D509 Iron deficiency anemia, unspecified: Secondary | ICD-10-CM | POA: Diagnosis not present

## 2015-05-26 DIAGNOSIS — R4789 Other speech disturbances: Secondary | ICD-10-CM | POA: Diagnosis not present

## 2015-05-26 DIAGNOSIS — D631 Anemia in chronic kidney disease: Secondary | ICD-10-CM | POA: Diagnosis not present

## 2015-05-26 DIAGNOSIS — E119 Type 2 diabetes mellitus without complications: Secondary | ICD-10-CM | POA: Diagnosis not present

## 2015-05-26 DIAGNOSIS — N189 Chronic kidney disease, unspecified: Secondary | ICD-10-CM

## 2015-05-26 DIAGNOSIS — Z992 Dependence on renal dialysis: Secondary | ICD-10-CM

## 2015-05-26 DIAGNOSIS — I6200 Nontraumatic subdural hemorrhage, unspecified: Secondary | ICD-10-CM

## 2015-05-26 DIAGNOSIS — S065X9A Traumatic subdural hemorrhage with loss of consciousness of unspecified duration, initial encounter: Secondary | ICD-10-CM

## 2015-05-26 NOTE — Progress Notes (Signed)
MRN: [id number] Name: [name]  Sex: male Age: 81 y.o. DOB: Oct 21, 1931  PSC #: Sonny Dandy Facility/Room:211 Level Of Care: SNF Provider: Merrilee Seashore D Emergency Contacts: Extended Emergency Contact Information Primary Emergency Contact: Gervacio,[name] Address: [address]          [place], [place] [address] [place] Home Phone: [phone] Mobile Phone: [phone] Relation: Spouse Secondary Emergency Contact: Bartee,Mitchell  [place] Home Phone: [phone] Relation: Son  Code Status:   Allergies: Aspirin and Lactose intolerance (gi)  Chief Complaint  Patient presents with  . New Admit To SNF    HPI: Patient is 81 y.o. male whohypertension, well-controlled diabetes mellitus type 2, ESRD, and Seizure DO who presented to the ED after he sustained a fall secondary to his left knee giving out as he was started to turn around with his walker. Per family patient with no change in mental status since fall. CT head obtained in the emergency room revealed an 8 mm left temporoparietal subdural hematoma and a 4 mm left parafalcine subdural hematoma with no evidence of midline shift. Pt was admitted to hospital from 9/12-16 where on 9/14 pt was found to have new onset AMS and difficulty with finding words. MRI did not reveal anything new so was felt 2/2 to SDH inducing a seizure and seizure meds were increased.. Pt is admitted to SNF with generalized weakness and falls for OT/PT. While at SNF pt will be followed for HTN, tx with norvasc, polyneuropathy tx with neurontin and DM2 tx with SSI.  Past Medical History  Diagnosis Date  . Hypertension   . Diabetes mellitus without complication   . Arthritis   . History of blood transfusion   . GI bleed   . ESRD (end stage renal disease)   . Seizure   . Dialysis patient     Past Surgical History  Procedure Laterality Date  . Esophagogastroduodenoscopy  10/03/2012    Procedure:  ESOPHAGOGASTRODUODENOSCOPY (EGD);  Surgeon: Theda Belfast, MD;  Location: [facility] ENDOSCOPY;  Service: Endoscopy;  Laterality: N/A;  . Colonoscopy  10/04/2012    Procedure: COLONOSCOPY;  Surgeon: Theda Belfast, MD;  Location: [facility] ENDOSCOPY;  Service: Endoscopy;  Laterality: N/A;  . Colonoscopy with esophagogastroduodenoscopy (egd) Left 11/30/2012    Procedure: COLONOSCOPY WITH ESOPHAGOGASTRODUODENOSCOPY (EGD);  Surgeon: Theda Belfast, MD;  Location: [facility] ENDOSCOPY;  Service: Endoscopy;  Laterality: Left;  . Spine surgery  2004    back fusion/ [facility] Penumalli,MD  . Retinal laser procedure Right 04/2015      Medication List       This list is accurate as of: 05/26/15 11:59 PM.  Always use your most recent med list.               ACCU-CHEK AVIVA PLUS test strip  Generic drug:  glucose blood  Check blood sugar three times daily     amLODipine 10 MG tablet  Commonly known as:  NORVASC  Take 10 mg by mouth Daily.     BESIVANCE 0.6 % Susp  Generic drug:  Besifloxacin HCl  Place 1 drop into the right eye 4 (four) times daily as needed. For 2 days after each monthly injection     calcitRIOL 0.5 MCG capsule  Commonly known as:  ROCALTROL  Take 2 capsules (1 mcg total) by mouth every Monday, Wednesday, and Friday with hemodialysis.     calcium acetate 667 MG capsule  Commonly known as:  PHOSLO  Take 2,001 mg by  mouth 3 (three) times daily.     colchicine 0.6 MG tablet  as needed.     cyclobenzaprine 5 MG tablet  Commonly known as:  FLEXERIL  Take 5 mg by mouth daily as needed.     darbepoetin 60 MCG/0.3ML Soln injection  Commonly known as:  ARANESP  Inject 0.3 mLs (60 mcg total) into the vein every Thursday with hemodialysis.     fexofenadine 180 MG tablet  Commonly known as:  ALLEGRA  Take 180 mg by mouth daily. As needed     folic acid 1 MG tablet  Commonly known as:  FOLVITE  Take 1 tablet (1 mg total) by mouth daily.     gabapentin 100 MG capsule  Commonly known as:   NEURONTIN  Take 1 capsule (100 mg total) by mouth daily.     HUMALOG KWIKPEN 100 UNIT/ML KiwkPen  Generic drug:  insulin lispro  Sliding Scale     Lacosamide 100 MG Tabs  Take 1 tablet (100 mg total) by mouth 2 (two) times daily.     multivitamin Tabs tablet  Take 1 tablet by mouth at bedtime.     NASACORT ALLERGY 24HR 55 MCG/ACT Aero nasal inhaler  Generic drug:  triamcinolone  Place 1 spray into the nose. One spray nasal as needed for allery     senna-docusate 8.6-50 MG per tablet  Commonly known as:  Senokot-S  Take 1 tablet by mouth. As needed     sodium chloride 0.9 % SOLN 100 mL with ferric gluconate 12.5 MG/ML SOLN 62.5 mg  Inject 62.5 mg into the vein every Thursday with hemodialysis.        No orders of the defined types were placed in this encounter.    Immunization History  Administered Date(s) Administered  . Influenza,inj,Quad PF,36+ Mos 05/16/2015    Social History  Substance Use Topics  . Smoking status: Never Smoker   . Smokeless tobacco: Never Used  . Alcohol Use: No    Family history is + sudden death  Review of Systems  DATA OBTAINED: from patient, nurse,wife and daughter GENERAL:  no fevers, fatigue, appetite changes SKIN: No itching, rash or wounds EYES: No eye pain, redness, discharge EARS: No earache, tinnitus, change in hearing NOSE: No congestion, drainage or bleeding  MOUTH/THROAT: No mouth or tooth pain, No sore throat RESPIRATORY: No cough, wheezing, SOB CARDIAC: No chest pain, palpitations, lower extremity edema  GI: No abdominal pain, No N/V/D or constipation, No heartburn or reflux  GU: No dysuria, frequency or urgency, or incontinence  MUSCULOSKELETAL: No unrelieved bone/joint pain NEUROLOGIC: No headache, dizziness or focal weakness PSYCHIATRIC: No c/o anxiety or sadness   Filed Vitals:   05/26/15 2051  BP: 142/76  Pulse: 78  Temp: 98.4 F (36.9 C)  Resp: 20    SpO2 Readings from Last 1 Encounters:  05/23/15 100%         Physical Exam  GENERAL APPEARANCE: Alert, conversant, very pleasant BM No acute distress.  SKIN: No diaphoresis rash HEAD: Normocephalic, atraumatic  EYES: Conjunctiva/lids clear. Pupils round, reactive. EOMs intact.  EARS: External exam WNL, canals clear. Hearing grossly normal.  NOSE: No deformity or discharge.  MOUTH/THROAT: Lips w/o lesions  RESPIRATORY: Breathing is even, unlabored. Lung sounds are clear   CARDIOVASCULAR: Heart RRR no murmurs, rubs or gallops. No peripheral edema.   GASTROINTESTINAL: Abdomen is soft, non-tender, not distended w/ normal bowel sounds. GENITOURINARY: Bladder non tender, not distended  MUSCULOSKELETAL: No abnormal joints or musculature NEUROLOGIC:  Cranial nerves 2-12 grossly intact. Moves all extremities; higher level functioning intact  PSYCHIATRIC: Mood and affect appropriate to situation, no behavioral issues  Patient Active Problem List   Diagnosis Date Noted  . Word finding difficulty 05/29/2015  . Polyneuropathy 05/29/2015  . SDH (subdural hematoma) x 2 05/19/2015  . Laceration of scalp 05/19/2015  . Degenerative disc disease, lumbar 08/15/2014  . Seizure disorder 10/17/2013  . Urinary incontinence 10/01/2013  . Other pancytopenia 10/01/2013  . Failure to thrive in adult 10/01/2013  . ESRD on dialysis 09/27/2013  . Secondary hyperparathyroidism, renal 09/27/2013  . Gout flare 01/17/2013  . Anemia in chronic kidney disease 11/30/2012  . Thrombocytopenia 11/30/2012  . Diabetes type 2, controlled 11/30/2012  . Hypoglycemia associated with diabetes 11/30/2012  . history of AVM (arteriovenous malformation) of colon with hemorrhage 11/29/2012  . Acute GI bleeding 10/02/2012  . Hypertension 10/02/2012    CBC    Component Value Date/Time   WBC 4.4 05/22/2015 2230   WBC 3.3* 05/13/2015 0925   RBC 3.19* 05/22/2015 2230   RBC 3.63* 05/13/2015 0925   HGB 10.7* 05/22/2015 2230   HCT 32.7* 05/22/2015 2230   HCT 37.9 05/13/2015  0925   PLT 88* 05/22/2015 2230   MCV 102.5* 05/22/2015 2230   LYMPHSABS 1.2 05/21/2015 0815   LYMPHSABS 1.3 05/13/2015 0925   MONOABS 0.4 05/21/2015 0815   EOSABS 0.1 05/21/2015 0815   EOSABS 0.1 08/15/2014 1059   BASOSABS 0.0 05/21/2015 0815   BASOSABS 0.0 05/13/2015 0925    CMP     Component Value Date/Time   NA 137 05/23/2015 0854   NA 141 05/13/2015 0925   K 4.7 05/23/2015 0854   CL 101 05/23/2015 0854   CO2 25 05/23/2015 0854   GLUCOSE 144* 05/23/2015 0854   GLUCOSE 127* 05/13/2015 0925   BUN 58* 05/23/2015 0854   BUN 56* 05/13/2015 0925   CREATININE 8.58* 05/23/2015 0854   CALCIUM 8.9 05/23/2015 0854   PROT 7.1 08/15/2014 1059   PROT 7.0 10/05/2013 0546   ALBUMIN 3.6 05/23/2015 0854   AST 24 08/15/2014 1059   ALT 15 08/15/2014 1059   ALKPHOS 56 08/15/2014 1059   BILITOT 0.4 08/15/2014 1059   GFRNONAA 5* 05/23/2015 0854   GFRAA 6* 05/23/2015 0854    Lab Results  Component Value Date   HGBA1C 5.0 05/13/2015     Ct Head Wo Contrast  05/20/2015   CLINICAL DATA:  Subdural hematoma.  EXAM: CT HEAD WITHOUT CONTRAST  TECHNIQUE: Contiguous axial images were obtained from the base of the skull through the vertex without intravenous contrast.  COMPARISON:  05/19/2015  FINDINGS: Again identified is 8 mm in maximum thickness left temporoparietal subdural hematoma. Blood products are again seen along the left side of falx cerebri. Subdural hematoma is now apparent along the left tentorium.  No evidence of a mass effect or midline shift. No evidence of acute infarction. Again seen are chronic small vessel white matter ischemic changes, and brain parenchymal atrophy. Basal ganglia calcifications also seen. Basal cisterns are preserved.  No depressed skull fractures. Small amount fluid within the right maxillary sinus. Visualized paranasal sinuses and mastoid air cells are otherwise not opacified.  IMPRESSION: Not significantly enlarged, but rather more apparent left supratentorial  subdural hematoma, tracking along the dural reflections. No evidence of midline shift.   Electronically Signed   By: Ted Mcalpine M.D.   On: 05/20/2015 12:52   Ct Head Wo Contrast  05/19/2015   CLINICAL DATA:  81 year old male with head and neck injury following acute fall. Headache and cervical spine pain.  EXAM: CT HEAD WITHOUT CONTRAST  CT CERVICAL SPINE WITHOUT CONTRAST  TECHNIQUE: Multidetector CT imaging of the head and cervical spine was performed following the standard protocol without intravenous contrast. Multiplanar CT image reconstructions of the cervical spine were also generated.  COMPARISON:  10/01/2013 and prior CTs.  10/06/2013 MR.  FINDINGS: CT HEAD FINDINGS  An 8 mm left temporoparietal subdural hematoma and a 4 mm upper left parafalcine subdural hematoma are identified.  There is no evidence of midline shift, acute infarction, intraparenchymal hemorrhage, hydrocephalus or mass lesion.  Atrophy and chronic small-vessel white matter ischemic changes are again identified.  No acute bony abnormalities are noted.  A tiny amount of fluid in the right maxillary sinus is identified.  CT CERVICAL SPINE FINDINGS  Reversal of the normal cervical lordosis is noted.  There is no evidence acute fracture, subluxation or prevertebral soft tissue swelling.  Moderate -severe degenerative disc disease/spondylosis from C3-C7 noted causing mild -moderate central spinal and bony foraminal narrowing at several levels.  No focal bony lesions are identified.  Soft tissue structures are unremarkable except for vascular calcifications.  The lung apices are clear.  IMPRESSION: 8 mm left temporoparietal subdural hematoma and 4 mm left parafalcine subdural hematoma. No evidence of midline shift.  No static evidence of acute injury to the cervical spine. Moderate -severe degenerative changes as described.  Cerebral atrophy and chronic small-vessel white matter ischemic changes.  Critical Value/emergent results were  called by telephone at the time of interpretation on 05/19/2015 at 9:00 am to Dr. Gerhard Munch , who verbally acknowledged these results.   Electronically Signed   By: Harmon Pier M.D.   On: 05/19/2015 09:00   Ct Cervical Spine Wo Contrast  05/19/2015   CLINICAL DATA:  81 year old male with head and neck injury following acute fall. Headache and cervical spine pain.  EXAM: CT HEAD WITHOUT CONTRAST  CT CERVICAL SPINE WITHOUT CONTRAST  TECHNIQUE: Multidetector CT imaging of the head and cervical spine was performed following the standard protocol without intravenous contrast. Multiplanar CT image reconstructions of the cervical spine were also generated.  COMPARISON:  10/01/2013 and prior CTs.  10/06/2013 MR.  FINDINGS: CT HEAD FINDINGS  An 8 mm left temporoparietal subdural hematoma and a 4 mm upper left parafalcine subdural hematoma are identified.  There is no evidence of midline shift, acute infarction, intraparenchymal hemorrhage, hydrocephalus or mass lesion.  Atrophy and chronic small-vessel white matter ischemic changes are again identified.  No acute bony abnormalities are noted.  A tiny amount of fluid in the right maxillary sinus is identified.  CT CERVICAL SPINE FINDINGS  Reversal of the normal cervical lordosis is noted.  There is no evidence acute fracture, subluxation or prevertebral soft tissue swelling.  Moderate -severe degenerative disc disease/spondylosis from C3-C7 noted causing mild -moderate central spinal and bony foraminal narrowing at several levels.  No focal bony lesions are identified.  Soft tissue structures are unremarkable except for vascular calcifications.  The lung apices are clear.  IMPRESSION: 8 mm left temporoparietal subdural hematoma and 4 mm left parafalcine subdural hematoma. No evidence of midline shift.  No static evidence of acute injury to the cervical spine. Moderate -severe degenerative changes as described.  Cerebral atrophy and chronic small-vessel white matter  ischemic changes.  Critical Value/emergent results were called by telephone at the time of interpretation on 05/19/2015 at 9:00 am to Dr. Gerhard Munch , who  verbally acknowledged these results.   Electronically Signed   By: Harmon Pier M.D.   On: 05/19/2015 09:00    Not all labs, radiology exams or other studies done during hospitalization come through on my EPIC note; however they are reviewed by me.    Assessment and Plan  SDH (subdural hematoma) x 2 Neurosurgery evaluated - follow-up CTs have all noted no significant enlargement in subdural hemorrhages    Word finding difficulty a new development afternoon of 9/14 - MRI did not reveal an acute CVA - MRA demonstrated no significant proximal stenosis, aneurysm, or branch vessel occlusion - sx felt to be related to L SDH likely inducing sz activity - vimpat increased to 100BID - to f/u w/ Dr. Marjory Lies  SNF - cont vimpat 100 mg BID  Seizure disorder a new development afternoon of 9/14 - MRI did not reveal an acute CVA - MRA demonstrated no significant proximal stenosis, aneurysm, or branch vessel occlusion - sx felt to be related to L SDH likely inducing sz activity - vimpat increased to 100BID - to f/u w/ Dr. Marjory Lies  SNF - cont vimpat 100 mg BID  Hypertension SNF - stable - cont norvasc 10 mg  Diabetes type 2, controlled BS have been running low; Aic 5.0  SNF - SSI prn  ESRD on dialysis SNF - TTS dialysis  Anemia in chronic kidney disease SNF - d/c Hb 10.7, pt gets tx at dialysi ;will follow with a CBC  Thrombocytopenia Transfused platelets 9/13 in setting of subdural - platelet count w/o significant change ;SNF - follow up with a CBC   Polyneuropathy SNF  - cont neurontin 100 mg daily   Time spent > 45 min;> 50% of time with patient was spent reviewing records, labs, tests and studies, counseling and developing plan of care  Margit Hanks, MD

## 2015-05-28 DIAGNOSIS — D509 Iron deficiency anemia, unspecified: Secondary | ICD-10-CM | POA: Diagnosis not present

## 2015-05-28 DIAGNOSIS — N186 End stage renal disease: Secondary | ICD-10-CM | POA: Diagnosis not present

## 2015-05-28 DIAGNOSIS — D631 Anemia in chronic kidney disease: Secondary | ICD-10-CM | POA: Diagnosis not present

## 2015-05-29 ENCOUNTER — Encounter: Payer: Self-pay | Admitting: Internal Medicine

## 2015-05-29 DIAGNOSIS — R4789 Other speech disturbances: Secondary | ICD-10-CM | POA: Insufficient documentation

## 2015-05-29 DIAGNOSIS — G629 Polyneuropathy, unspecified: Secondary | ICD-10-CM | POA: Insufficient documentation

## 2015-05-29 NOTE — Assessment & Plan Note (Signed)
a new development afternoon of 9/14 - MRI did not reveal an acute CVA - MRA demonstrated no significant proximal stenosis, aneurysm, or branch vessel occlusion - sx felt to be related to L SDH likely inducing sz activity - vimpat increased to 100BID - to f/u w/ Dr. Marjory Lies  SNF - cont vimpat 100 mg BID

## 2015-05-29 NOTE — Assessment & Plan Note (Signed)
SNF - TTS dialysis

## 2015-05-29 NOTE — Assessment & Plan Note (Signed)
SNF - d/c Hb 10.7, pt gets tx at dialysi ;will follow with a CBC

## 2015-05-29 NOTE — Assessment & Plan Note (Signed)
Neurosurgery evaluated - follow-up CTs have all noted no significant enlargement in subdural hemorrhages

## 2015-05-29 NOTE — Assessment & Plan Note (Signed)
SNF  - cont neurontin 100 mg daily

## 2015-05-29 NOTE — Assessment & Plan Note (Signed)
BS have been running low; Aic 5.0  SNF - SSI prn

## 2015-05-29 NOTE — Assessment & Plan Note (Signed)
a new development afternoon of 9/14 - MRI did not reveal an acute CVA - MRA demonstrated no significant proximal stenosis, aneurysm, or branch vessel occlusion - sx felt to be related to L SDH likely inducing sz activity - vimpat increased to 100BID - to f/u w/ Dr. Penumalli  SNF - cont vimpat 100 mg BID 

## 2015-05-29 NOTE — Assessment & Plan Note (Signed)
Transfused platelets 9/13 in setting of subdural - platelet count w/o significant change ;SNF - follow up with a CBC

## 2015-05-29 NOTE — Assessment & Plan Note (Signed)
SNF - stable - cont norvasc 10 mg

## 2015-05-30 ENCOUNTER — Non-Acute Institutional Stay (SKILLED_NURSING_FACILITY): Payer: Medicare Other | Admitting: Nurse Practitioner

## 2015-05-30 DIAGNOSIS — Z992 Dependence on renal dialysis: Secondary | ICD-10-CM | POA: Diagnosis not present

## 2015-05-30 DIAGNOSIS — N189 Chronic kidney disease, unspecified: Secondary | ICD-10-CM

## 2015-05-30 DIAGNOSIS — G40909 Epilepsy, unspecified, not intractable, without status epilepticus: Secondary | ICD-10-CM

## 2015-05-30 DIAGNOSIS — D631 Anemia in chronic kidney disease: Secondary | ICD-10-CM | POA: Diagnosis not present

## 2015-05-30 DIAGNOSIS — S065X9A Traumatic subdural hemorrhage with loss of consciousness of unspecified duration, initial encounter: Secondary | ICD-10-CM

## 2015-05-30 DIAGNOSIS — D509 Iron deficiency anemia, unspecified: Secondary | ICD-10-CM | POA: Diagnosis not present

## 2015-05-30 DIAGNOSIS — E119 Type 2 diabetes mellitus without complications: Secondary | ICD-10-CM

## 2015-05-30 DIAGNOSIS — S065XAA Traumatic subdural hemorrhage with loss of consciousness status unknown, initial encounter: Secondary | ICD-10-CM

## 2015-05-30 DIAGNOSIS — I6200 Nontraumatic subdural hemorrhage, unspecified: Secondary | ICD-10-CM

## 2015-05-30 DIAGNOSIS — N186 End stage renal disease: Secondary | ICD-10-CM

## 2015-05-30 DIAGNOSIS — I10 Essential (primary) hypertension: Secondary | ICD-10-CM | POA: Diagnosis not present

## 2015-05-30 NOTE — Progress Notes (Signed)
Patient ID: [name], male   DOB: 1932-08-08, 81 y.o.   MRN: [id number]    Nursing Home Location:  [facility]   Place of Service: SNF (31)  PCP: [name], [name], DO  Allergies  Allergen Reactions  . Aspirin Other (See Comments)    Bleeding   . Lactose Intolerance (Gi) Diarrhea    Chief Complaint  Patient presents with  . Discharge Note    HPI:  Patient is a 81 y.o. male seen today at [facility] for discharge home. Pt with a hx of whohypertension, well-controlled diabetes mellitus type 2, ESRD, and Seizure DO who presented to the ED after he sustained a fall secondary to his left knee giving out as he was started to turn around. CT head obtained in the emergency room revealed an 8 mm left temporoparietal subdural hematoma and a 4 mm left parafalcine subdural hematoma with no evidence of midline shift. Pt was admitted to hospital from 9/12-9/16. On 9/14 pt was found to have new onset AMS and difficulty with finding words. MRI did not reveal anything new so was felt like AMS was due to SDH inducing a seizure and seizure meds were increased. Difficulty finding words have improved at this time. Pt current admitted to SNF for weakness and fall for PT/OT. Pt is ready to go home with family (wife and son). Patient currently doing well with therapy, and stable to discharge home with home health.   Review of Systems:  Review of Systems  Constitutional: Negative for activity change, appetite change, fatigue and unexpected weight change.  HENT: Negative for congestion and hearing loss.   Eyes: Negative.   Respiratory: Negative for cough and shortness of breath.   Cardiovascular: Negative for chest pain, palpitations and leg swelling.  Gastrointestinal: Negative for abdominal pain, diarrhea and constipation.  Genitourinary: Negative for dysuria and difficulty urinating.  Musculoskeletal: Negative for myalgias and arthralgias.  Skin: Negative for color change  and wound.  Neurological: Negative for dizziness and weakness.  Psychiatric/Behavioral: Negative for behavioral problems and agitation.    Past Medical History  Diagnosis Date  . Hypertension   . Diabetes mellitus without complication   . Arthritis   . History of blood transfusion   . GI bleed   . ESRD (end stage renal disease)   . Seizure   . Dialysis patient    Past Surgical History  Procedure Laterality Date  . Esophagogastroduodenoscopy  10/03/2012    Procedure: ESOPHAGOGASTRODUODENOSCOPY (EGD);  Surgeon: Theda Belfast, MD;  Location: [facility] ENDOSCOPY;  Service: Endoscopy;  Laterality: N/A;  . Colonoscopy  10/04/2012    Procedure: COLONOSCOPY;  Surgeon: Theda Belfast, MD;  Location: [facility] ENDOSCOPY;  Service: Endoscopy;  Laterality: N/A;  . Colonoscopy with esophagogastroduodenoscopy (egd) Left 11/30/2012    Procedure: COLONOSCOPY WITH ESOPHAGOGASTRODUODENOSCOPY (EGD);  Surgeon: Theda Belfast, MD;  Location: [facility] ENDOSCOPY;  Service: Endoscopy;  Laterality: Left;  . Spine surgery  2004    back fusion/ [facility] Penumalli,MD  . Retinal laser procedure Right 04/2015   Social History:   reports that he has never smoked. He has never used smokeless tobacco. He reports that he does not drink alcohol or use illicit drugs.  Family History  Problem Relation Age of Onset  . Sudden death Mother   . Sudden death Father     Medications: Patient's Medications  New Prescriptions   No medications on file  Previous Medications   ACCU-CHEK AVIVA PLUS TEST STRIP  Check blood sugar three times daily   AMLODIPINE (NORVASC) 10 MG TABLET    Take 10 mg by mouth Daily.    BESIVANCE 0.6 % SUSP    Place 1 drop into the right eye 4 (four) times daily as needed. For 2 days after each monthly injection   CALCITRIOL (ROCALTROL) 0.5 MCG CAPSULE    Take 2 capsules (1 mcg total) by mouth every Monday, Wednesday, and Friday with hemodialysis.   CALCIUM ACETATE (PHOSLO) 667 MG CAPSULE    Take 2,001 mg by mouth 3  (three) times daily.    COLCHICINE 0.6 MG TABLET    as needed.    CYCLOBENZAPRINE (FLEXERIL) 5 MG TABLET    Take 5 mg by mouth daily as needed.   DARBEPOETIN (ARANESP) 60 MCG/0.3ML SOLN INJECTION    Inject 0.3 mLs (60 mcg total) into the vein every Thursday with hemodialysis.   FEXOFENADINE (ALLEGRA) 180 MG TABLET    Take 180 mg by mouth daily. As needed   FOLIC ACID (FOLVITE) 1 MG TABLET    Take 1 tablet (1 mg total) by mouth daily.   GABAPENTIN (NEURONTIN) 100 MG CAPSULE    Take 1 capsule (100 mg total) by mouth daily.   HUMALOG KWIKPEN 100 UNIT/ML KIWKPEN    Sliding Scale   LACOSAMIDE 100 MG TABS    Take 1 tablet (100 mg total) by mouth 2 (two) times daily.   MULTIVITAMIN (RENA-VIT) TABS TABLET    Take 1 tablet by mouth at bedtime.   SENNA-DOCUSATE (SENOKOT-S) 8.6-50 MG PER TABLET    Take 1 tablet by mouth. As needed   SODIUM CHLORIDE 0.9 % SOLN 100 ML WITH FERRIC GLUCONATE 12.5 MG/ML SOLN 62.5 MG    Inject 62.5 mg into the vein every Thursday with hemodialysis.   TRIAMCINOLONE (NASACORT ALLERGY 24HR) 55 MCG/ACT AERO NASAL INHALER    Place 1 spray into the nose. One spray nasal as needed for allery  Modified Medications   No medications on file  Discontinued Medications   No medications on file     Physical Exam: Filed Vitals:   05/30/15 1312  BP: 131/72  Pulse: 53  Temp: 97.3 F (36.3 C)  Resp: 20    Physical Exam  Constitutional: He appears well-developed and well-nourished. No distress.  HENT:  Head: Normocephalic and atraumatic.  Right Ear: External ear normal.  Left Ear: External ear normal.  Nose: Nose normal.  Mouth/Throat: Oropharynx is clear and moist.  =  Eyes: Conjunctivae and EOM are normal. Pupils are equal, round, and reactive to light.  Wears glasses  Neck: Normal range of motion. Neck supple.  Cardiovascular: Normal rate and regular rhythm.   Murmur heard. Right upper arm AV fistula with normal pulse and thrill  Pulmonary/Chest: Effort normal and breath  sounds normal.  Abdominal: Soft. Bowel sounds are normal. He exhibits no distension. There is no tenderness.  Musculoskeletal: He exhibits no tenderness.  Neurological: He is alert. He exhibits normal muscle tone.  Short term memory loss  Skin: Skin is warm and dry.  Psychiatric: He has a normal mood and affect.    Labs reviewed: Basic Metabolic Panel:  Recent Labs  70/62/37 2105  05/21/15 0815 05/22/15 2235 05/23/15 0854  NA  --   < > 138 136 137  K  --   < > 4.5 4.3 4.7  CL  --   < > 101 98* 101  CO2  --   < > 24 26 25   GLUCOSE  --   < >  118* 200* 144*  BUN  --   < > 67* 53* 58*  CREATININE  --   < > 9.13* 7.91* 8.58*  CALCIUM  --   < > 8.5* 8.7* 8.9  MG 2.2  --   --   --   --   PHOS  --   < > 6.2* 5.8* 6.3*  < > = values in this interval not displayed. Liver Function Tests:  Recent Labs  08/15/14 1059  05/21/15 0815 05/22/15 2235 05/23/15 0854  AST 24  --   --   --   --   ALT 15  --   --   --   --   ALKPHOS 56  --   --   --   --   BILITOT 0.4  --   --   --   --   PROT 7.1  --   --   --   --   ALBUMIN  --   < > 3.7 3.7 3.6  < > = values in this interval not displayed. No results for input(s): LIPASE, AMYLASE in the last 8760 hours. No results for input(s): AMMONIA in the last 8760 hours. CBC:  Recent Labs  05/13/15 0925 05/19/15 0920  05/21/15 0815 05/22/15 0238 05/22/15 2230  WBC 3.3* 4.1  < > 3.7* 3.8* 4.4  NEUTROABS 1.6 3.0  --  2.0  --   --   HGB  --  12.1*  < > 10.4* 11.1* 10.7*  HCT 37.9 36.2*  < > 30.9* 33.6* 32.7*  MCV  --  102.8*  < > 102.3* 102.1* 102.5*  PLT  --  96*  < > 95* 104* 88*  < > = values in this interval not displayed. TSH: No results for input(s): TSH in the last 8760 hours. A1C: Lab Results  Component Value Date   HGBA1C 5.0 05/13/2015   Lipid Panel:  Recent Labs  05/13/15 0925 05/20/15 0313  CHOL 107 111  HDL 66 53  LDLCALC 31 45  TRIG 48 63  CHOLHDL 1.6 2.1    Radiological Exams: Ct Head Wo  Contrast  05/20/2015   CLINICAL DATA:  Subdural hematoma.  EXAM: CT HEAD WITHOUT CONTRAST  TECHNIQUE: Contiguous axial images were obtained from the base of the skull through the vertex without intravenous contrast.  COMPARISON:  05/19/2015  FINDINGS: Again identified is 8 mm in maximum thickness left temporoparietal subdural hematoma. Blood products are again seen along the left side of falx cerebri. Subdural hematoma is now apparent along the left tentorium.  No evidence of a mass effect or midline shift. No evidence of acute infarction. Again seen are chronic small vessel white matter ischemic changes, and brain parenchymal atrophy. Basal ganglia calcifications also seen. Basal cisterns are preserved.  No depressed skull fractures. Small amount fluid within the right maxillary sinus. Visualized paranasal sinuses and mastoid air cells are otherwise not opacified.  IMPRESSION: Not significantly enlarged, but rather more apparent left supratentorial subdural hematoma, tracking along the dural reflections. No evidence of midline shift.   Electronically Signed   By: Ted Mcalpine M.D.   On: 05/20/2015 12:52   Ct Head Wo Contrast  05/19/2015   CLINICAL DATA:  81 year old male with head and neck injury following acute fall. Headache and cervical spine pain.  EXAM: CT HEAD WITHOUT CONTRAST  CT CERVICAL SPINE WITHOUT CONTRAST  TECHNIQUE: Multidetector CT imaging of the head and cervical spine was performed following the standard protocol without intravenous contrast.  Multiplanar CT image reconstructions of the cervical spine were also generated.  COMPARISON:  10/01/2013 and prior CTs.  10/06/2013 MR.  FINDINGS: CT HEAD FINDINGS  An 8 mm left temporoparietal subdural hematoma and a 4 mm upper left parafalcine subdural hematoma are identified.  There is no evidence of midline shift, acute infarction, intraparenchymal hemorrhage, hydrocephalus or mass lesion.  Atrophy and chronic small-vessel white matter ischemic  changes are again identified.  No acute bony abnormalities are noted.  A tiny amount of fluid in the right maxillary sinus is identified.  CT CERVICAL SPINE FINDINGS  Reversal of the normal cervical lordosis is noted.  There is no evidence acute fracture, subluxation or prevertebral soft tissue swelling.  Moderate -severe degenerative disc disease/spondylosis from C3-C7 noted causing mild -moderate central spinal and bony foraminal narrowing at several levels.  No focal bony lesions are identified.  Soft tissue structures are unremarkable except for vascular calcifications.  The lung apices are clear.  IMPRESSION: 8 mm left temporoparietal subdural hematoma and 4 mm left parafalcine subdural hematoma. No evidence of midline shift.  No static evidence of acute injury to the cervical spine. Moderate -severe degenerative changes as described.  Cerebral atrophy and chronic small-vessel white matter ischemic changes.  Critical Value/emergent results were called by telephone at the time of interpretation on 05/19/2015 at 9:00 am to Dr. Gerhard Munch , who verbally acknowledged these results.   Electronically Signed   By: Harmon Pier M.D.   On: 05/19/2015 09:00   Ct Cervical Spine Wo Contrast  05/19/2015   CLINICAL DATA:  81 year old male with head and neck injury following acute fall. Headache and cervical spine pain.  EXAM: CT HEAD WITHOUT CONTRAST  CT CERVICAL SPINE WITHOUT CONTRAST  TECHNIQUE: Multidetector CT imaging of the head and cervical spine was performed following the standard protocol without intravenous contrast. Multiplanar CT image reconstructions of the cervical spine were also generated.  COMPARISON:  10/01/2013 and prior CTs.  10/06/2013 MR.  FINDINGS: CT HEAD FINDINGS  An 8 mm left temporoparietal subdural hematoma and a 4 mm upper left parafalcine subdural hematoma are identified.  There is no evidence of midline shift, acute infarction, intraparenchymal hemorrhage, hydrocephalus or mass lesion.   Atrophy and chronic small-vessel white matter ischemic changes are again identified.  No acute bony abnormalities are noted.  A tiny amount of fluid in the right maxillary sinus is identified.  CT CERVICAL SPINE FINDINGS  Reversal of the normal cervical lordosis is noted.  There is no evidence acute fracture, subluxation or prevertebral soft tissue swelling.  Moderate -severe degenerative disc disease/spondylosis from C3-C7 noted causing mild -moderate central spinal and bony foraminal narrowing at several levels.  No focal bony lesions are identified.  Soft tissue structures are unremarkable except for vascular calcifications.  The lung apices are clear.  IMPRESSION: 8 mm left temporoparietal subdural hematoma and 4 mm left parafalcine subdural hematoma. No evidence of midline shift.  No static evidence of acute injury to the cervical spine. Moderate -severe degenerative changes as described.  Cerebral atrophy and chronic small-vessel white matter ischemic changes.  Critical Value/emergent results were called by telephone at the time of interpretation on 05/19/2015 at 9:00 am to Dr. Gerhard Munch , who verbally acknowledged these results.   Electronically Signed   By: Harmon Pier M.D.   On: 05/19/2015 09:00    Assessment/Plan 1. SDH (subdural hematoma) x 2 CT head obtained in the emergency room revealed an 8 mm left temporoparietal subdural hematoma and a  4 mm left parafalcine subdural hematoma with no evidence of midline shift. Pt was evaluated by Neurosurgery in hospital and hematoma remained stable.   2. Seizure disorder vimpat increased to 100 mg twice daily due to difficulty finding word ? Seizure activity due to SDH. No further seizure like activity noted. Pt has scheduled follow up with neurology.   3. Essential hypertension -conts on norvasc 10 mg daily   4. Diabetes type 2, controlled -A1c 5.0 in hospital, had not been using SSI previously, will DC at this time and will need to follow up  with PCP for ongoing follow up  5. ESRD on dialysis -receiving HD on Monday, Wednesday and Friday  6. Anemia in chronic kidney disease conts on aranesp and iron per dialysis    pt is stable for discharge-will need PT/OTper home health. DME needed includes standard WC.  Rx written.  will need to follow up with PCP within 2 weeks.    Janene Harvey. Biagio Borg  [facility] [phone] 8 am - 5 pm) [phone] (after hours)

## 2015-06-02 ENCOUNTER — Encounter (INDEPENDENT_AMBULATORY_CARE_PROVIDER_SITE_OTHER): Payer: Medicare Other | Admitting: Ophthalmology

## 2015-06-02 DIAGNOSIS — D631 Anemia in chronic kidney disease: Secondary | ICD-10-CM | POA: Diagnosis not present

## 2015-06-02 DIAGNOSIS — N186 End stage renal disease: Secondary | ICD-10-CM | POA: Diagnosis not present

## 2015-06-02 DIAGNOSIS — D509 Iron deficiency anemia, unspecified: Secondary | ICD-10-CM | POA: Diagnosis not present

## 2015-06-03 DIAGNOSIS — M545 Low back pain: Secondary | ICD-10-CM | POA: Diagnosis not present

## 2015-06-03 DIAGNOSIS — D696 Thrombocytopenia, unspecified: Secondary | ICD-10-CM | POA: Diagnosis not present

## 2015-06-03 DIAGNOSIS — S0101XD Laceration without foreign body of scalp, subsequent encounter: Secondary | ICD-10-CM | POA: Diagnosis not present

## 2015-06-03 DIAGNOSIS — G40909 Epilepsy, unspecified, not intractable, without status epilepticus: Secondary | ICD-10-CM | POA: Diagnosis not present

## 2015-06-03 DIAGNOSIS — Z9181 History of falling: Secondary | ICD-10-CM | POA: Diagnosis not present

## 2015-06-03 DIAGNOSIS — N186 End stage renal disease: Secondary | ICD-10-CM | POA: Diagnosis not present

## 2015-06-03 DIAGNOSIS — E1122 Type 2 diabetes mellitus with diabetic chronic kidney disease: Secondary | ICD-10-CM | POA: Diagnosis not present

## 2015-06-03 DIAGNOSIS — Z794 Long term (current) use of insulin: Secondary | ICD-10-CM | POA: Diagnosis not present

## 2015-06-03 DIAGNOSIS — I120 Hypertensive chronic kidney disease with stage 5 chronic kidney disease or end stage renal disease: Secondary | ICD-10-CM | POA: Diagnosis not present

## 2015-06-03 DIAGNOSIS — S065X0D Traumatic subdural hemorrhage without loss of consciousness, subsequent encounter: Secondary | ICD-10-CM | POA: Diagnosis not present

## 2015-06-03 DIAGNOSIS — M1990 Unspecified osteoarthritis, unspecified site: Secondary | ICD-10-CM | POA: Diagnosis not present

## 2015-06-03 DIAGNOSIS — Z992 Dependence on renal dialysis: Secondary | ICD-10-CM | POA: Diagnosis not present

## 2015-06-03 DIAGNOSIS — F0390 Unspecified dementia without behavioral disturbance: Secondary | ICD-10-CM | POA: Diagnosis not present

## 2015-06-04 DIAGNOSIS — N186 End stage renal disease: Secondary | ICD-10-CM | POA: Diagnosis not present

## 2015-06-04 DIAGNOSIS — D631 Anemia in chronic kidney disease: Secondary | ICD-10-CM | POA: Diagnosis not present

## 2015-06-04 DIAGNOSIS — D509 Iron deficiency anemia, unspecified: Secondary | ICD-10-CM | POA: Diagnosis not present

## 2015-06-06 DIAGNOSIS — I120 Hypertensive chronic kidney disease with stage 5 chronic kidney disease or end stage renal disease: Secondary | ICD-10-CM | POA: Diagnosis not present

## 2015-06-06 DIAGNOSIS — Z992 Dependence on renal dialysis: Secondary | ICD-10-CM | POA: Diagnosis not present

## 2015-06-06 DIAGNOSIS — F0390 Unspecified dementia without behavioral disturbance: Secondary | ICD-10-CM | POA: Diagnosis not present

## 2015-06-06 DIAGNOSIS — M545 Low back pain: Secondary | ICD-10-CM | POA: Diagnosis not present

## 2015-06-06 DIAGNOSIS — D509 Iron deficiency anemia, unspecified: Secondary | ICD-10-CM | POA: Diagnosis not present

## 2015-06-06 DIAGNOSIS — D631 Anemia in chronic kidney disease: Secondary | ICD-10-CM | POA: Diagnosis not present

## 2015-06-06 DIAGNOSIS — Z9181 History of falling: Secondary | ICD-10-CM | POA: Diagnosis not present

## 2015-06-06 DIAGNOSIS — D696 Thrombocytopenia, unspecified: Secondary | ICD-10-CM | POA: Diagnosis not present

## 2015-06-06 DIAGNOSIS — M1990 Unspecified osteoarthritis, unspecified site: Secondary | ICD-10-CM | POA: Diagnosis not present

## 2015-06-06 DIAGNOSIS — S065X0D Traumatic subdural hemorrhage without loss of consciousness, subsequent encounter: Secondary | ICD-10-CM | POA: Diagnosis not present

## 2015-06-06 DIAGNOSIS — E1122 Type 2 diabetes mellitus with diabetic chronic kidney disease: Secondary | ICD-10-CM | POA: Diagnosis not present

## 2015-06-06 DIAGNOSIS — G40909 Epilepsy, unspecified, not intractable, without status epilepticus: Secondary | ICD-10-CM | POA: Diagnosis not present

## 2015-06-06 DIAGNOSIS — N186 End stage renal disease: Secondary | ICD-10-CM | POA: Diagnosis not present

## 2015-06-06 DIAGNOSIS — S0101XD Laceration without foreign body of scalp, subsequent encounter: Secondary | ICD-10-CM | POA: Diagnosis not present

## 2015-06-06 DIAGNOSIS — Z794 Long term (current) use of insulin: Secondary | ICD-10-CM | POA: Diagnosis not present

## 2015-06-09 ENCOUNTER — Encounter: Payer: Self-pay | Admitting: Internal Medicine

## 2015-06-09 ENCOUNTER — Ambulatory Visit (INDEPENDENT_AMBULATORY_CARE_PROVIDER_SITE_OTHER): Payer: Medicare Other | Admitting: Internal Medicine

## 2015-06-09 VITALS — BP 128/60 | HR 81 | Temp 97.6°F | Resp 20 | Ht 72.0 in | Wt 204.6 lb

## 2015-06-09 DIAGNOSIS — R4789 Other speech disturbances: Secondary | ICD-10-CM

## 2015-06-09 DIAGNOSIS — Z23 Encounter for immunization: Secondary | ICD-10-CM | POA: Diagnosis not present

## 2015-06-09 DIAGNOSIS — G3184 Mild cognitive impairment, so stated: Secondary | ICD-10-CM | POA: Diagnosis not present

## 2015-06-09 DIAGNOSIS — G40909 Epilepsy, unspecified, not intractable, without status epilepticus: Secondary | ICD-10-CM

## 2015-06-09 DIAGNOSIS — E1129 Type 2 diabetes mellitus with other diabetic kidney complication: Secondary | ICD-10-CM | POA: Diagnosis not present

## 2015-06-09 DIAGNOSIS — N186 End stage renal disease: Secondary | ICD-10-CM

## 2015-06-09 DIAGNOSIS — S065X9A Traumatic subdural hemorrhage with loss of consciousness of unspecified duration, initial encounter: Secondary | ICD-10-CM

## 2015-06-09 DIAGNOSIS — E1122 Type 2 diabetes mellitus with diabetic chronic kidney disease: Secondary | ICD-10-CM | POA: Diagnosis not present

## 2015-06-09 DIAGNOSIS — D631 Anemia in chronic kidney disease: Secondary | ICD-10-CM

## 2015-06-09 DIAGNOSIS — M5136 Other intervertebral disc degeneration, lumbar region: Secondary | ICD-10-CM

## 2015-06-09 DIAGNOSIS — I6200 Nontraumatic subdural hemorrhage, unspecified: Secondary | ICD-10-CM | POA: Diagnosis not present

## 2015-06-09 DIAGNOSIS — N2581 Secondary hyperparathyroidism of renal origin: Secondary | ICD-10-CM

## 2015-06-09 DIAGNOSIS — I150 Renovascular hypertension: Secondary | ICD-10-CM

## 2015-06-09 DIAGNOSIS — Z992 Dependence on renal dialysis: Secondary | ICD-10-CM

## 2015-06-09 DIAGNOSIS — S065XAA Traumatic subdural hemorrhage with loss of consciousness status unknown, initial encounter: Secondary | ICD-10-CM

## 2015-06-09 DIAGNOSIS — Z794 Long term (current) use of insulin: Secondary | ICD-10-CM

## 2015-06-09 DIAGNOSIS — N189 Chronic kidney disease, unspecified: Secondary | ICD-10-CM | POA: Diagnosis not present

## 2015-06-09 DIAGNOSIS — M51369 Other intervertebral disc degeneration, lumbar region without mention of lumbar back pain or lower extremity pain: Secondary | ICD-10-CM

## 2015-06-09 NOTE — Progress Notes (Signed)
Patient ID: [name], male   DOB: September 16, 1931, 81 y.o.   MRN: [id number]   Location:  [facility]  Code Status: DNR Goals of Care: Advanced Directive information Does patient have an advance directive?: Yes, Type of Advance Directive: Healthcare Power of Attorney, Does patient want to make changes to advanced directive?: No - Patient declined   Chief Complaint  Patient presents with  . Hospitalization Follow-up    s/p rehab stay--just discharged from [place]    HPI: Patient is a 81 y.o. black male seen in the office today for a hospital and rehab f/u.  Hospital and [facility] notes reviewed.  On 9/12, he was getting ready to go to HD.  Went to brush his teeth.  Knee gave way, hit wall, then hit floor with his back.  Was getting ready to get up.  Saw his blood on the floor.  Hollered for his wife to call the ambulance.  He's unable to see where he hit his head (left posterior parietal area).  Was hospitalized 9/12-9/16/16.  CT head obtained in the emergency room revealed an 8 mm left temporoparietal subdural hematoma and a 4 mm left parafalcine subdural hematoma with no evidence of midline shift. Neurosurgery evaluated - follow-up CTs have all noted no significant enlargement in subdural hemorrhages.  He was said to have a period of increased word finding difficulty, but his MRI/MRA did not show acute stroke or any change in teh subdural.  He does have some chronic word-finding.  Seizure medication was increased (vimpat to 100mg  po bid).  He had a platelet transfusion for his subdural on 9/13.  Went to rehab at [facility] 05/23/15--he had PT, OT.  Was there until 05/30/15.    Says he's stiff and tight all day, but loosens up through the day.  In the mornings, he is also shaky.  Not shaky by bedtime.  Is getting home health PT, OT.   Standard wheelchair was ordered for him when he left [facility].    Review of Systems:  Review of Systems  Constitutional:  Negative for fever and chills.  HENT: Negative for congestion.   Eyes: Positive for blurred vision.       Lost his glasses at home  Respiratory: Negative for shortness of breath.   Cardiovascular: Negative for chest pain.  Gastrointestinal: Negative for abdominal pain.  Genitourinary: Negative for dysuria.  Musculoskeletal: Positive for back pain and falls.  Neurological: Positive for tremors and seizures. Negative for dizziness, tingling, focal weakness, loss of consciousness and headaches.  Psychiatric/Behavioral: Positive for memory loss. Negative for depression.    Past Medical History  Diagnosis Date  . Hypertension   . Diabetes mellitus without complication (HCC)   . Arthritis   . History of blood transfusion   . GI bleed   . ESRD (end stage renal disease) (HCC)   . Seizure (HCC)   . Dialysis patient [facility])     Past Surgical History  Procedure Laterality Date  . Esophagogastroduodenoscopy  10/03/2012    Procedure: ESOPHAGOGASTRODUODENOSCOPY (EGD);  Surgeon: 10/05/2012, MD;  Location: [facility] ENDOSCOPY;  Service: Endoscopy;  Laterality: N/A;  . Colonoscopy  10/04/2012    Procedure: COLONOSCOPY;  Surgeon: 10/06/2012, MD;  Location: [facility] ENDOSCOPY;  Service: Endoscopy;  Laterality: N/A;  . Colonoscopy with esophagogastroduodenoscopy (egd) Left 11/30/2012    Procedure: COLONOSCOPY WITH ESOPHAGOGASTRODUODENOSCOPY (EGD);  Surgeon: 12/02/2012, MD;  Location: [facility] ENDOSCOPY;  Service: Endoscopy;  Laterality: Left;  .  Spine surgery  2004    back fusion/ [facility] Penumalli,MD  . Retinal laser procedure Right 04/2015    Allergies  Allergen Reactions  . Aspirin Other (See Comments)    Bleeding   . Lactose Intolerance (Gi) Diarrhea   Medications: Patient's Medications  New Prescriptions   No medications on file  Previous Medications   ACCU-CHEK AVIVA PLUS TEST STRIP    Check blood sugar three times daily   AMLODIPINE (NORVASC) 10 MG TABLET    Take 10 mg by mouth Daily.     BESIVANCE 0.6 % SUSP    Place 1 drop into the right eye 4 (four) times daily as needed. For 2 days after each monthly injection   CALCITRIOL (ROCALTROL) 0.5 MCG CAPSULE    Take 2 capsules (1 mcg total) by mouth every Monday, Wednesday, and Friday with hemodialysis.   CALCIUM ACETATE (PHOSLO) 667 MG CAPSULE    Take 2,001 mg by mouth 3 (three) times daily.    COLCHICINE 0.6 MG TABLET    as needed.    CYCLOBENZAPRINE (FLEXERIL) 5 MG TABLET    Take 5 mg by mouth daily as needed.   DARBEPOETIN (ARANESP) 60 MCG/0.3ML SOLN INJECTION    Inject 0.3 mLs (60 mcg total) into the vein every Thursday with hemodialysis.   FEXOFENADINE (ALLEGRA) 180 MG TABLET    Take 180 mg by mouth daily. As needed   FOLIC ACID (FOLVITE) 1 MG TABLET    Take 1 tablet (1 mg total) by mouth daily.   GABAPENTIN (NEURONTIN) 100 MG CAPSULE    Take 1 capsule (100 mg total) by mouth daily.   HUMALOG KWIKPEN 100 UNIT/ML KIWKPEN    Sliding Scale   LACOSAMIDE 100 MG TABS    Take 1 tablet (100 mg total) by mouth 2 (two) times daily.   MULTIVITAMIN (RENA-VIT) TABS TABLET    Take 1 tablet by mouth at bedtime.   SENNA-DOCUSATE (SENOKOT-S) 8.6-50 MG PER TABLET    Take 1 tablet by mouth. As needed   SODIUM CHLORIDE 0.9 % SOLN 100 ML WITH FERRIC GLUCONATE 12.5 MG/ML SOLN 62.5 MG    Inject 62.5 mg into the vein every Thursday with hemodialysis.   TRIAMCINOLONE (NASACORT ALLERGY 24HR) 55 MCG/ACT AERO NASAL INHALER    Place 1 spray into the nose. One spray nasal as needed for allery  Modified Medications   No medications on file  Discontinued Medications   No medications on file    Physical Exam: Filed Vitals:   06/09/15 1117  BP: 128/60  Pulse: 81  Temp: 97.6 F (36.4 C)  TempSrc: Oral  Resp: 20  Height: 6' (1.829 m)  Weight: 204 lb 9.6 oz (92.806 kg)  SpO2: 98%   Physical Exam  Constitutional: He is oriented to person, place, and time. He appears well-developed and well-nourished. No distress.  Cardiovascular: Normal rate, regular  rhythm, normal heart sounds and intact distal pulses.   Pulmonary/Chest: Effort normal and breath sounds normal. No respiratory distress.  Abdominal: Soft. Bowel sounds are normal.  Musculoskeletal:  Walking unsteadily with walker--stoops over with walker far in front of him  Neurological: He is alert and oriented to person, place, and time.  Alert and finding words well today; some short term memory loss (unchanged)  Skin: Skin is warm and dry.  No visible excoriation/laceration/ecchymoses on posterior parietal area remaining  Psychiatric: He has a normal mood and affect.    Labs reviewed: Basic Metabolic Panel:  Recent Labs  18/29/93 2105  05/21/15 0815 05/22/15 2235 05/23/15 0854  NA  --   < > 138 136 137  K  --   < > 4.5 4.3 4.7  CL  --   < > 101 98* 101  CO2  --   < > GLUCOSE  --   < > 118* 200* 144*  BUN  --   < > 67* 53* 58*  CREATININE  --   < > 9.13* 7.91* 8.58*  CALCIUM  --   < > 8.5* 8.7* 8.9  MG 2.2  --   --   --   --   PHOS  --   < > 6.2* 5.8* 6.3*  < > = values in this interval not displayed. Liver Function Tests:  Recent Labs  08/15/14 1059  05/21/15 0815 05/22/15 2235 05/23/15 0854  AST 24  --   --   --   --   ALT 15  --   --   --   --   ALKPHOS 56  --   --   --   --   BILITOT 0.4  --   --   --   --   PROT 7.1  --   --   --   --   ALBUMIN  --   < > 3.7 3.7 3.6  < > = values in this interval not displayed. No results for input(s): LIPASE, AMYLASE in the last 8760 hours. No results for input(s): AMMONIA in the last 8760 hours. CBC:  Recent Labs  05/13/15 0925 05/19/15 0920  05/21/15 0815 05/22/15 0238 05/22/15 2230  WBC 3.3* 4.1  < > 3.7* 3.8* 4.4  NEUTROABS 1.6 3.0  --  2.0  --   --   HGB  --  12.1*  < > 10.4* 11.1* 10.7*  HCT 37.9 36.2*  < > 30.9* 33.6* 32.7*  MCV  --  102.8*  < > 102.3* 102.1* 102.5*  PLT  --  96*  < > 95* 104* 88*  < > = values in this interval not displayed. Lipid Panel:  Recent Labs  05/13/15 0925  05/20/15 0313  CHOL 107 111  HDL 66 53  LDLCALC 31 45  TRIG 48 63  CHOLHDL 1.6 2.1   Lab Results  Component Value Date   HGBA1C 5.0 05/13/2015   Ct Head Wo Contrast  05/20/2015   CLINICAL DATA:  Subdural hematoma.  EXAM: CT HEAD WITHOUT CONTRAST  TECHNIQUE: Contiguous axial images were obtained from the base of the skull through the vertex without intravenous contrast.  COMPARISON:  05/19/2015  FINDINGS: Again identified is 8 mm in maximum thickness left temporoparietal subdural hematoma. Blood products are again seen along the left side of falx cerebri. Subdural hematoma is now apparent along the left tentorium.  No evidence of a mass effect or midline shift. No evidence of acute infarction. Again seen are chronic small vessel white matter ischemic changes, and brain parenchymal atrophy. Basal ganglia calcifications also seen. Basal cisterns are preserved.  No depressed skull fractures. Small amount fluid within the right maxillary sinus. Visualized paranasal sinuses and mastoid air cells are otherwise not opacified.  IMPRESSION: Not significantly enlarged, but rather more apparent left supratentorial subdural hematoma, tracking along the dural reflections. No evidence of midline shift.   Electronically Signed   By: Ted Mcalpine M.D.   On: 05/20/2015 12:52   Ct Head Wo Contrast  05/19/2015   CLINICAL DATA:  81 year old male with head  and neck injury following acute fall. Headache and cervical spine pain.  EXAM: CT HEAD WITHOUT CONTRAST  CT CERVICAL SPINE WITHOUT CONTRAST  TECHNIQUE: Multidetector CT imaging of the head and cervical spine was performed following the standard protocol without intravenous contrast. Multiplanar CT image reconstructions of the cervical spine were also generated.  COMPARISON:  10/01/2013 and prior CTs.  10/06/2013 MR.  FINDINGS: CT HEAD FINDINGS  An 8 mm left temporoparietal subdural hematoma and a 4 mm upper left parafalcine subdural hematoma are identified.   There is no evidence of midline shift, acute infarction, intraparenchymal hemorrhage, hydrocephalus or mass lesion.  Atrophy and chronic small-vessel white matter ischemic changes are again identified.  No acute bony abnormalities are noted.  A tiny amount of fluid in the right maxillary sinus is identified.  CT CERVICAL SPINE FINDINGS  Reversal of the normal cervical lordosis is noted.  There is no evidence acute fracture, subluxation or prevertebral soft tissue swelling.  Moderate -severe degenerative disc disease/spondylosis from C3-C7 noted causing mild -moderate central spinal and bony foraminal narrowing at several levels.  No focal bony lesions are identified.  Soft tissue structures are unremarkable except for vascular calcifications.  The lung apices are clear.  IMPRESSION: 8 mm left temporoparietal subdural hematoma and 4 mm left parafalcine subdural hematoma. No evidence of midline shift.  No static evidence of acute injury to the cervical spine. Moderate -severe degenerative changes as described.  Cerebral atrophy and chronic small-vessel white matter ischemic changes.  Critical Value/emergent results were called by telephone at the time of interpretation on 05/19/2015 at 9:00 am to Dr. Gerhard Munch , who verbally acknowledged these results.   Electronically Signed   By: Harmon Pier M.D.   On: 05/19/2015 09:00   Ct Cervical Spine Wo Contrast  05/19/2015   CLINICAL DATA:  81 year old male with head and neck injury following acute fall. Headache and cervical spine pain.  EXAM: CT HEAD WITHOUT CONTRAST  CT CERVICAL SPINE WITHOUT CONTRAST  TECHNIQUE: Multidetector CT imaging of the head and cervical spine was performed following the standard protocol without intravenous contrast. Multiplanar CT image reconstructions of the cervical spine were also generated.  COMPARISON:  10/01/2013 and prior CTs.  10/06/2013 MR.  FINDINGS: CT HEAD FINDINGS  An 8 mm left temporoparietal subdural hematoma and a 4 mm  upper left parafalcine subdural hematoma are identified.  There is no evidence of midline shift, acute infarction, intraparenchymal hemorrhage, hydrocephalus or mass lesion.  Atrophy and chronic small-vessel white matter ischemic changes are again identified.  No acute bony abnormalities are noted.  A tiny amount of fluid in the right maxillary sinus is identified.  CT CERVICAL SPINE FINDINGS  Reversal of the normal cervical lordosis is noted.  There is no evidence acute fracture, subluxation or prevertebral soft tissue swelling.  Moderate -severe degenerative disc disease/spondylosis from C3-C7 noted causing mild -moderate central spinal and bony foraminal narrowing at several levels.  No focal bony lesions are identified.  Soft tissue structures are unremarkable except for vascular calcifications.  The lung apices are clear.  IMPRESSION: 8 mm left temporoparietal subdural hematoma and 4 mm left parafalcine subdural hematoma. No evidence of midline shift.  No static evidence of acute injury to the cervical spine. Moderate -severe degenerative changes as described.  Cerebral atrophy and chronic small-vessel white matter ischemic changes.  Critical Value/emergent results were called by telephone at the time of interpretation on 05/19/2015 at 9:00 am to Dr. Gerhard Munch , who verbally acknowledged these results.  Electronically Signed   By: Harmon Pier M.D.   On: 05/19/2015 09:00   Assessment/Plan 1. SDH (subdural hematoma) x 2 -cont to monitor  -had no progression on repeat imaging -is not on anticoagulation at present  2. Seizure disorder (HCC) -continues on vimpat 100mg  po bid  -keep f/u with Dr. Danae Orleans  3. Controlled type 2 diabetes mellitus with chronic kidney disease on chronic dialysis, with long-term current use of insulin (HCC) -well controlled with humalog as needed  4. Anemia in chronic kidney disease -stale with aranesp, renavit, folic acid thru nephrologist  5. Word finding  difficulty -was felt to be due to seizure activity after SDH -vimpat was increased -pt is now having increased tremulousness in am that gradually decreases through the day  6. Degenerative disc disease, lumbar -remains painful intermittently, worse in the am and after HD, continues on flexeril prn and gabapentin  7. Renovascular hypertension -bp well controlled at present with amlodipine  8. Secondary hyperparathyroidism, renal (HCC) -cont current therapy as per nephrology  9. Mild cognitive impairment with memory loss -stable, cont to monitor  10. Need for vaccination with 13-polyvalent pneumococcal conjugate vaccine - Pneumococcal conjugate vaccine 13-valent IM was given  Labs/tests ordered:   Orders Placed This Encounter  Procedures  . Pneumococcal conjugate vaccine 13-valent IM   Next appt:  Keep as scheduled in december  Damari Suastegui L. Swayze Pries, D.O. Geriatrics [facility] [address], [place] [address] Cell Phone (Mon-Fri 8am-5pm):  [phone] On Call:  [phone] & follow prompts after 5pm & weekends Office Phone:  [phone] Office Fax:  [phone]

## 2015-06-10 DIAGNOSIS — M545 Low back pain: Secondary | ICD-10-CM | POA: Diagnosis not present

## 2015-06-10 DIAGNOSIS — S0101XD Laceration without foreign body of scalp, subsequent encounter: Secondary | ICD-10-CM | POA: Diagnosis not present

## 2015-06-10 DIAGNOSIS — D696 Thrombocytopenia, unspecified: Secondary | ICD-10-CM | POA: Diagnosis not present

## 2015-06-10 DIAGNOSIS — F0390 Unspecified dementia without behavioral disturbance: Secondary | ICD-10-CM | POA: Diagnosis not present

## 2015-06-10 DIAGNOSIS — Z794 Long term (current) use of insulin: Secondary | ICD-10-CM | POA: Diagnosis not present

## 2015-06-10 DIAGNOSIS — N186 End stage renal disease: Secondary | ICD-10-CM | POA: Diagnosis not present

## 2015-06-10 DIAGNOSIS — I120 Hypertensive chronic kidney disease with stage 5 chronic kidney disease or end stage renal disease: Secondary | ICD-10-CM | POA: Diagnosis not present

## 2015-06-10 DIAGNOSIS — S065X0D Traumatic subdural hemorrhage without loss of consciousness, subsequent encounter: Secondary | ICD-10-CM | POA: Diagnosis not present

## 2015-06-10 DIAGNOSIS — Z992 Dependence on renal dialysis: Secondary | ICD-10-CM | POA: Diagnosis not present

## 2015-06-10 DIAGNOSIS — M1990 Unspecified osteoarthritis, unspecified site: Secondary | ICD-10-CM | POA: Diagnosis not present

## 2015-06-10 DIAGNOSIS — G40909 Epilepsy, unspecified, not intractable, without status epilepticus: Secondary | ICD-10-CM | POA: Diagnosis not present

## 2015-06-10 DIAGNOSIS — E1122 Type 2 diabetes mellitus with diabetic chronic kidney disease: Secondary | ICD-10-CM | POA: Diagnosis not present

## 2015-06-10 DIAGNOSIS — Z9181 History of falling: Secondary | ICD-10-CM | POA: Diagnosis not present

## 2015-06-11 DIAGNOSIS — E1129 Type 2 diabetes mellitus with other diabetic kidney complication: Secondary | ICD-10-CM | POA: Diagnosis not present

## 2015-06-11 DIAGNOSIS — D631 Anemia in chronic kidney disease: Secondary | ICD-10-CM | POA: Diagnosis not present

## 2015-06-11 DIAGNOSIS — N186 End stage renal disease: Secondary | ICD-10-CM | POA: Diagnosis not present

## 2015-06-12 DIAGNOSIS — G40909 Epilepsy, unspecified, not intractable, without status epilepticus: Secondary | ICD-10-CM | POA: Diagnosis not present

## 2015-06-12 DIAGNOSIS — M1990 Unspecified osteoarthritis, unspecified site: Secondary | ICD-10-CM | POA: Diagnosis not present

## 2015-06-12 DIAGNOSIS — M545 Low back pain: Secondary | ICD-10-CM | POA: Diagnosis not present

## 2015-06-12 DIAGNOSIS — E1122 Type 2 diabetes mellitus with diabetic chronic kidney disease: Secondary | ICD-10-CM | POA: Diagnosis not present

## 2015-06-12 DIAGNOSIS — S065X0D Traumatic subdural hemorrhage without loss of consciousness, subsequent encounter: Secondary | ICD-10-CM | POA: Diagnosis not present

## 2015-06-12 DIAGNOSIS — Z992 Dependence on renal dialysis: Secondary | ICD-10-CM | POA: Diagnosis not present

## 2015-06-12 DIAGNOSIS — I120 Hypertensive chronic kidney disease with stage 5 chronic kidney disease or end stage renal disease: Secondary | ICD-10-CM | POA: Diagnosis not present

## 2015-06-12 DIAGNOSIS — N186 End stage renal disease: Secondary | ICD-10-CM | POA: Diagnosis not present

## 2015-06-12 DIAGNOSIS — Z794 Long term (current) use of insulin: Secondary | ICD-10-CM | POA: Diagnosis not present

## 2015-06-12 DIAGNOSIS — D696 Thrombocytopenia, unspecified: Secondary | ICD-10-CM | POA: Diagnosis not present

## 2015-06-12 DIAGNOSIS — F0390 Unspecified dementia without behavioral disturbance: Secondary | ICD-10-CM | POA: Diagnosis not present

## 2015-06-12 DIAGNOSIS — S0101XD Laceration without foreign body of scalp, subsequent encounter: Secondary | ICD-10-CM | POA: Diagnosis not present

## 2015-06-12 DIAGNOSIS — Z9181 History of falling: Secondary | ICD-10-CM | POA: Diagnosis not present

## 2015-06-13 ENCOUNTER — Telehealth: Payer: Self-pay | Admitting: *Deleted

## 2015-06-13 DIAGNOSIS — D631 Anemia in chronic kidney disease: Secondary | ICD-10-CM | POA: Diagnosis not present

## 2015-06-13 DIAGNOSIS — N186 End stage renal disease: Secondary | ICD-10-CM | POA: Diagnosis not present

## 2015-06-13 DIAGNOSIS — E1129 Type 2 diabetes mellitus with other diabetic kidney complication: Secondary | ICD-10-CM | POA: Diagnosis not present

## 2015-06-13 NOTE — Telephone Encounter (Signed)
[name] with [name] called and wanted verbal PT orders for 2 times a week for 6 weeks. Verbal given.

## 2015-06-16 DIAGNOSIS — E1129 Type 2 diabetes mellitus with other diabetic kidney complication: Secondary | ICD-10-CM | POA: Diagnosis not present

## 2015-06-16 DIAGNOSIS — N186 End stage renal disease: Secondary | ICD-10-CM | POA: Diagnosis not present

## 2015-06-16 DIAGNOSIS — D631 Anemia in chronic kidney disease: Secondary | ICD-10-CM | POA: Diagnosis not present

## 2015-06-17 DIAGNOSIS — Z992 Dependence on renal dialysis: Secondary | ICD-10-CM | POA: Diagnosis not present

## 2015-06-17 DIAGNOSIS — S0101XD Laceration without foreign body of scalp, subsequent encounter: Secondary | ICD-10-CM | POA: Diagnosis not present

## 2015-06-17 DIAGNOSIS — M545 Low back pain: Secondary | ICD-10-CM | POA: Diagnosis not present

## 2015-06-17 DIAGNOSIS — G40909 Epilepsy, unspecified, not intractable, without status epilepticus: Secondary | ICD-10-CM | POA: Diagnosis not present

## 2015-06-17 DIAGNOSIS — I120 Hypertensive chronic kidney disease with stage 5 chronic kidney disease or end stage renal disease: Secondary | ICD-10-CM | POA: Diagnosis not present

## 2015-06-17 DIAGNOSIS — M1990 Unspecified osteoarthritis, unspecified site: Secondary | ICD-10-CM | POA: Diagnosis not present

## 2015-06-17 DIAGNOSIS — D696 Thrombocytopenia, unspecified: Secondary | ICD-10-CM | POA: Diagnosis not present

## 2015-06-17 DIAGNOSIS — E1122 Type 2 diabetes mellitus with diabetic chronic kidney disease: Secondary | ICD-10-CM | POA: Diagnosis not present

## 2015-06-17 DIAGNOSIS — S065X0D Traumatic subdural hemorrhage without loss of consciousness, subsequent encounter: Secondary | ICD-10-CM | POA: Diagnosis not present

## 2015-06-17 DIAGNOSIS — Z794 Long term (current) use of insulin: Secondary | ICD-10-CM | POA: Diagnosis not present

## 2015-06-17 DIAGNOSIS — Z9181 History of falling: Secondary | ICD-10-CM | POA: Diagnosis not present

## 2015-06-17 DIAGNOSIS — N186 End stage renal disease: Secondary | ICD-10-CM | POA: Diagnosis not present

## 2015-06-17 DIAGNOSIS — F0390 Unspecified dementia without behavioral disturbance: Secondary | ICD-10-CM | POA: Diagnosis not present

## 2015-06-18 DIAGNOSIS — D631 Anemia in chronic kidney disease: Secondary | ICD-10-CM | POA: Diagnosis not present

## 2015-06-18 DIAGNOSIS — N186 End stage renal disease: Secondary | ICD-10-CM | POA: Diagnosis not present

## 2015-06-18 DIAGNOSIS — E1129 Type 2 diabetes mellitus with other diabetic kidney complication: Secondary | ICD-10-CM | POA: Diagnosis not present

## 2015-06-19 DIAGNOSIS — Z794 Long term (current) use of insulin: Secondary | ICD-10-CM | POA: Diagnosis not present

## 2015-06-19 DIAGNOSIS — G40909 Epilepsy, unspecified, not intractable, without status epilepticus: Secondary | ICD-10-CM | POA: Diagnosis not present

## 2015-06-19 DIAGNOSIS — M1990 Unspecified osteoarthritis, unspecified site: Secondary | ICD-10-CM | POA: Diagnosis not present

## 2015-06-19 DIAGNOSIS — D696 Thrombocytopenia, unspecified: Secondary | ICD-10-CM | POA: Diagnosis not present

## 2015-06-19 DIAGNOSIS — I120 Hypertensive chronic kidney disease with stage 5 chronic kidney disease or end stage renal disease: Secondary | ICD-10-CM | POA: Diagnosis not present

## 2015-06-19 DIAGNOSIS — S0101XD Laceration without foreign body of scalp, subsequent encounter: Secondary | ICD-10-CM | POA: Diagnosis not present

## 2015-06-19 DIAGNOSIS — Z992 Dependence on renal dialysis: Secondary | ICD-10-CM | POA: Diagnosis not present

## 2015-06-19 DIAGNOSIS — N186 End stage renal disease: Secondary | ICD-10-CM | POA: Diagnosis not present

## 2015-06-19 DIAGNOSIS — Z9181 History of falling: Secondary | ICD-10-CM | POA: Diagnosis not present

## 2015-06-19 DIAGNOSIS — F0390 Unspecified dementia without behavioral disturbance: Secondary | ICD-10-CM | POA: Diagnosis not present

## 2015-06-19 DIAGNOSIS — E1122 Type 2 diabetes mellitus with diabetic chronic kidney disease: Secondary | ICD-10-CM | POA: Diagnosis not present

## 2015-06-19 DIAGNOSIS — S065X0D Traumatic subdural hemorrhage without loss of consciousness, subsequent encounter: Secondary | ICD-10-CM | POA: Diagnosis not present

## 2015-06-19 DIAGNOSIS — M545 Low back pain: Secondary | ICD-10-CM | POA: Diagnosis not present

## 2015-06-20 ENCOUNTER — Observation Stay (HOSPITAL_COMMUNITY)
Admission: EM | Admit: 2015-06-20 | Discharge: 2015-06-23 | Disposition: A | Discharge status: Home Health | Payer: Medicare Other | Attending: Internal Medicine | Admitting: Internal Medicine

## 2015-06-20 ENCOUNTER — Emergency Department (HOSPITAL_COMMUNITY): Payer: Medicare Other

## 2015-06-20 ENCOUNTER — Encounter (HOSPITAL_COMMUNITY): Payer: Self-pay | Admitting: Emergency Medicine

## 2015-06-20 DIAGNOSIS — I120 Hypertensive chronic kidney disease with stage 5 chronic kidney disease or end stage renal disease: Secondary | ICD-10-CM | POA: Diagnosis not present

## 2015-06-20 DIAGNOSIS — N2581 Secondary hyperparathyroidism of renal origin: Secondary | ICD-10-CM | POA: Diagnosis present

## 2015-06-20 DIAGNOSIS — I6200 Nontraumatic subdural hemorrhage, unspecified: Secondary | ICD-10-CM | POA: Diagnosis not present

## 2015-06-20 DIAGNOSIS — G40909 Epilepsy, unspecified, not intractable, without status epilepticus: Secondary | ICD-10-CM

## 2015-06-20 DIAGNOSIS — I10 Essential (primary) hypertension: Secondary | ICD-10-CM

## 2015-06-20 DIAGNOSIS — M25552 Pain in left hip: Secondary | ICD-10-CM | POA: Diagnosis not present

## 2015-06-20 DIAGNOSIS — R262 Difficulty in walking, not elsewhere classified: Secondary | ICD-10-CM | POA: Insufficient documentation

## 2015-06-20 DIAGNOSIS — D631 Anemia in chronic kidney disease: Secondary | ICD-10-CM | POA: Insufficient documentation

## 2015-06-20 DIAGNOSIS — G9340 Encephalopathy, unspecified: Secondary | ICD-10-CM | POA: Diagnosis not present

## 2015-06-20 DIAGNOSIS — Z79899 Other long term (current) drug therapy: Secondary | ICD-10-CM | POA: Diagnosis not present

## 2015-06-20 DIAGNOSIS — R531 Weakness: Secondary | ICD-10-CM | POA: Diagnosis not present

## 2015-06-20 DIAGNOSIS — G8929 Other chronic pain: Secondary | ICD-10-CM | POA: Diagnosis not present

## 2015-06-20 DIAGNOSIS — N186 End stage renal disease: Secondary | ICD-10-CM

## 2015-06-20 DIAGNOSIS — E1122 Type 2 diabetes mellitus with diabetic chronic kidney disease: Secondary | ICD-10-CM | POA: Diagnosis not present

## 2015-06-20 DIAGNOSIS — E1142 Type 2 diabetes mellitus with diabetic polyneuropathy: Secondary | ICD-10-CM | POA: Diagnosis not present

## 2015-06-20 DIAGNOSIS — S065X0A Traumatic subdural hemorrhage without loss of consciousness, initial encounter: Secondary | ICD-10-CM | POA: Diagnosis not present

## 2015-06-20 DIAGNOSIS — R001 Bradycardia, unspecified: Secondary | ICD-10-CM | POA: Diagnosis not present

## 2015-06-20 DIAGNOSIS — R269 Unspecified abnormalities of gait and mobility: Secondary | ICD-10-CM | POA: Insufficient documentation

## 2015-06-20 DIAGNOSIS — D61818 Other pancytopenia: Secondary | ICD-10-CM | POA: Diagnosis present

## 2015-06-20 DIAGNOSIS — R296 Repeated falls: Secondary | ICD-10-CM | POA: Diagnosis not present

## 2015-06-20 DIAGNOSIS — R404 Transient alteration of awareness: Secondary | ICD-10-CM | POA: Diagnosis not present

## 2015-06-20 DIAGNOSIS — Z992 Dependence on renal dialysis: Secondary | ICD-10-CM | POA: Diagnosis not present

## 2015-06-20 DIAGNOSIS — M1612 Unilateral primary osteoarthritis, left hip: Secondary | ICD-10-CM | POA: Diagnosis not present

## 2015-06-20 DIAGNOSIS — R627 Adult failure to thrive: Secondary | ICD-10-CM | POA: Diagnosis not present

## 2015-06-20 DIAGNOSIS — W19XXXA Unspecified fall, initial encounter: Secondary | ICD-10-CM | POA: Diagnosis not present

## 2015-06-20 DIAGNOSIS — R4182 Altered mental status, unspecified: Secondary | ICD-10-CM

## 2015-06-20 DIAGNOSIS — G629 Polyneuropathy, unspecified: Secondary | ICD-10-CM

## 2015-06-20 LAB — URINALYSIS, ROUTINE W REFLEX MICROSCOPIC
BILIRUBIN URINE: NEGATIVE
Glucose, UA: 100 mg/dL — AB
KETONES UR: NEGATIVE mg/dL
Leukocytes, UA: NEGATIVE
NITRITE: NEGATIVE
PH: 8 (ref 5.0–8.0)
Protein, ur: 100 mg/dL — AB
SPECIFIC GRAVITY, URINE: 1.011 (ref 1.005–1.030)
Urobilinogen, UA: 0.2 mg/dL (ref 0.0–1.0)

## 2015-06-20 LAB — GLUCOSE, CAPILLARY: Glucose-Capillary: 126 mg/dL — ABNORMAL HIGH (ref 65–99)

## 2015-06-20 LAB — RENAL FUNCTION PANEL
ALBUMIN: 3.7 g/dL (ref 3.5–5.0)
Anion gap: 11 (ref 5–15)
BUN: 60 mg/dL — AB (ref 6–20)
CALCIUM: 8.6 mg/dL — AB (ref 8.9–10.3)
CO2: 24 mmol/L (ref 22–32)
Chloride: 98 mmol/L — ABNORMAL LOW (ref 101–111)
Creatinine, Ser: 7.27 mg/dL — ABNORMAL HIGH (ref 0.61–1.24)
GFR calc Af Amer: 7 mL/min — ABNORMAL LOW (ref >60)
GFR, EST NON AFRICAN AMERICAN: 6 mL/min — AB (ref >60)
GLUCOSE: 228 mg/dL — AB (ref 65–99)
PHOSPHORUS: 2.6 mg/dL (ref 2.5–4.6)
Potassium: 5 mmol/L (ref 3.5–5.1)
SODIUM: 133 mmol/L — AB (ref 135–145)

## 2015-06-20 LAB — I-STAT CHEM 8, ED
BUN: 83 mg/dL — AB (ref 6–20)
CHLORIDE: 102 mmol/L (ref 101–111)
CREATININE: 9.2 mg/dL — AB (ref 0.61–1.24)
Calcium, Ion: 1.28 mmol/L (ref 1.13–1.30)
Glucose, Bld: 152 mg/dL — ABNORMAL HIGH (ref 65–99)
HEMATOCRIT: 34 % — AB (ref 39.0–52.0)
Hemoglobin: 11.6 g/dL — ABNORMAL LOW (ref 13.0–17.0)
POTASSIUM: 5.6 mmol/L — AB (ref 3.5–5.1)
Sodium: 136 mmol/L (ref 135–145)
TCO2: 24 mmol/L (ref 0–100)

## 2015-06-20 LAB — CBC WITH DIFFERENTIAL/PLATELET
Basophils Absolute: 0 10*3/uL (ref 0.0–0.1)
Basophils Relative: 0 %
Eosinophils Absolute: 0.2 10*3/uL (ref 0.0–0.7)
Eosinophils Relative: 6 %
HEMATOCRIT: 31.5 % — AB (ref 39.0–52.0)
Hemoglobin: 10.2 g/dL — ABNORMAL LOW (ref 13.0–17.0)
LYMPHS PCT: 28 %
Lymphs Abs: 1.1 10*3/uL (ref 0.7–4.0)
MCH: 32.6 pg (ref 26.0–34.0)
MCHC: 32.4 g/dL (ref 30.0–36.0)
MCV: 100.6 fL — AB (ref 78.0–100.0)
MONO ABS: 0.3 10*3/uL (ref 0.1–1.0)
MONOS PCT: 7 %
NEUTROS ABS: 2.2 10*3/uL (ref 1.7–7.7)
Neutrophils Relative %: 59 %
Platelets: 89 10*3/uL — ABNORMAL LOW (ref 150–400)
RBC: 3.13 MIL/uL — ABNORMAL LOW (ref 4.22–5.81)
RDW: 15.5 % (ref 11.5–15.5)
WBC: 3.8 10*3/uL — ABNORMAL LOW (ref 4.0–10.5)

## 2015-06-20 LAB — URINE MICROSCOPIC-ADD ON

## 2015-06-20 MED ORDER — RENA-VITE PO TABS
1.0000 | ORAL_TABLET | Freq: Every day | ORAL | Status: DC
  Administered 2015-06-20 – 2015-06-22 (×3): 1 via ORAL
  Filled 2015-06-20 (×3): qty 1

## 2015-06-20 MED ORDER — GABAPENTIN 100 MG PO CAPS
100.0000 mg | ORAL_CAPSULE | Freq: Every day | ORAL | Status: DC
  Administered 2015-06-20 – 2015-06-23 (×4): 100 mg via ORAL
  Filled 2015-06-20 (×4): qty 1

## 2015-06-20 MED ORDER — SODIUM CHLORIDE 0.9 % IJ SOLN
3.0000 mL | Freq: Two times a day (BID) | INTRAMUSCULAR | Status: DC
  Administered 2015-06-20 – 2015-06-23 (×6): 3 mL via INTRAVENOUS

## 2015-06-20 MED ORDER — LIDOCAINE-PRILOCAINE 2.5-2.5 % EX CREA
1.0000 "application " | TOPICAL_CREAM | CUTANEOUS | Status: DC | PRN
  Filled 2015-06-20: qty 5

## 2015-06-20 MED ORDER — SODIUM CHLORIDE 0.9 % IV SOLN: 100.0000 mL | INTRAVENOUS | Status: DC | PRN

## 2015-06-20 MED ORDER — INSULIN ASPART 100 UNIT/ML ~~LOC~~ SOLN: 0.0000 [IU] | Freq: Every day | SUBCUTANEOUS | Status: DC

## 2015-06-20 MED ORDER — AMLODIPINE BESYLATE 10 MG PO TABS
10.0000 mg | ORAL_TABLET | Freq: Every day | ORAL | Status: DC
  Administered 2015-06-20 – 2015-06-22 (×3): 10 mg via ORAL
  Filled 2015-06-20 (×3): qty 1

## 2015-06-20 MED ORDER — LORATADINE 10 MG PO TABS
10.0000 mg | ORAL_TABLET | Freq: Every day | ORAL | Status: DC
  Administered 2015-06-20 – 2015-06-23 (×4): 10 mg via ORAL
  Filled 2015-06-20 (×4): qty 1

## 2015-06-20 MED ORDER — CALCIUM ACETATE (PHOS BINDER) 667 MG PO CAPS
2001.0000 mg | ORAL_CAPSULE | Freq: Three times a day (TID) | ORAL | Status: DC
  Administered 2015-06-21 – 2015-06-23 (×6): 2001 mg via ORAL
  Filled 2015-06-20 (×8): qty 3

## 2015-06-20 MED ORDER — HEPARIN SODIUM (PORCINE) 1000 UNIT/ML DIALYSIS
1000.0000 [IU] | INTRAMUSCULAR | Status: DC | PRN
  Filled 2015-06-20: qty 1

## 2015-06-20 MED ORDER — LIDOCAINE HCL (PF) 1 % IJ SOLN: 5.0000 mL | INTRAMUSCULAR | Status: DC | PRN

## 2015-06-20 MED ORDER — DARBEPOETIN ALFA-POLYSORBATE 60 MCG/0.3ML IJ SOLN: 60.0000 ug | INTRAMUSCULAR | Status: DC

## 2015-06-20 MED ORDER — PENTAFLUOROPROP-TETRAFLUOROETH EX AERO
1.0000 "application " | INHALATION_SPRAY | CUTANEOUS | Status: DC | PRN
  Filled 2015-06-20: qty 30

## 2015-06-20 MED ORDER — FOLIC ACID 1 MG PO TABS
1.0000 mg | ORAL_TABLET | Freq: Every day | ORAL | Status: DC
  Administered 2015-06-20 – 2015-06-23 (×4): 1 mg via ORAL
  Filled 2015-06-20 (×4): qty 1

## 2015-06-20 MED ORDER — LACOSAMIDE 50 MG PO TABS
100.0000 mg | ORAL_TABLET | Freq: Two times a day (BID) | ORAL | Status: DC
  Administered 2015-06-20 – 2015-06-23 (×6): 100 mg via ORAL
  Filled 2015-06-20 (×6): qty 2

## 2015-06-20 MED ORDER — SENNOSIDES-DOCUSATE SODIUM 8.6-50 MG PO TABS: 1.0000 | ORAL_TABLET | Freq: Every evening | ORAL | Status: DC | PRN

## 2015-06-20 MED ORDER — ALTEPLASE 2 MG IJ SOLR
2.0000 mg | Freq: Once | INTRAMUSCULAR | Status: DC | PRN
  Filled 2015-06-20: qty 2

## 2015-06-20 MED ORDER — DARBEPOETIN ALFA 60 MCG/0.3ML IJ SOSY: 60.0000 ug | PREFILLED_SYRINGE | INTRAMUSCULAR | Status: DC

## 2015-06-20 MED ORDER — CALCITRIOL 0.5 MCG PO CAPS
1.0000 ug | ORAL_CAPSULE | ORAL | Status: DC
  Administered 2015-06-23: 1 ug via ORAL

## 2015-06-20 MED ORDER — INSULIN ASPART 100 UNIT/ML ~~LOC~~ SOLN
0.0000 [IU] | Freq: Three times a day (TID) | SUBCUTANEOUS | Status: DC
  Administered 2015-06-21: 3 [IU] via SUBCUTANEOUS
  Administered 2015-06-22: 1 [IU] via SUBCUTANEOUS
  Administered 2015-06-22: 2 [IU] via SUBCUTANEOUS
  Administered 2015-06-22 – 2015-06-23 (×2): 1 [IU] via SUBCUTANEOUS

## 2015-06-20 MED ORDER — TRAMADOL HCL 50 MG PO TABS
50.0000 mg | ORAL_TABLET | Freq: Once | ORAL | Status: AC
  Administered 2015-06-20: 50 mg via ORAL
  Filled 2015-06-20: qty 1

## 2015-06-20 NOTE — ED Notes (Signed)
Pt is dialysis patient, last treatment was on Wednesday. Is due for dialysis today.

## 2015-06-20 NOTE — Progress Notes (Signed)
Pt becoming more  restless and will not stay in bed, Benedetto Coons NP notified and ordered sitter at bedside. Will continue to monitor.

## 2015-06-20 NOTE — ED Provider Notes (Signed)
CSN: [id number]     Arrival date & time 06/20/15  0706 History   First MD Initiated Contact with Patient 06/20/15 [phone]     Chief Complaint  Patient presents with  . Back Pain  . Weakness     (Consider location/radiation/quality/duration/timing/severity/associated sxs/prior Treatment) Patient is a 81 y.o. male presenting with weakness. The history is provided by the patient.  Weakness This is a new problem. The current episode started today. The problem occurs constantly. The problem has been gradually improving. Associated symptoms include weakness. Pertinent negatives include no abdominal pain, chest pain, chills, coughing, fever, nausea, rash or vomiting. Nothing aggravates the symptoms. He has tried nothing for the symptoms. The treatment provided no relief.   [name] is a 81 yo M PMH HTN, DM2, ESRD on dialysis M,W,F , seizure d/o, chronic back pain and subdural hematoma. He presents today with weakness. He woke up this AM and was unable to arise from his bed. He was only able to roll over. He went to bed last night like his normal self. He slept well. Has no taken any medications this AM. This has never happened before. Denies any changes to his medications. Denies any chest pain, SOB, abdominal pain, fever, chills, constipation, or diarrhea. He was admitted last month for a fall and found to have subdural hematoma. Subsequent CT scans have shown the hematoma to be stable.   Past Medical History  Diagnosis Date  . Hypertension   . Diabetes mellitus without complication (HCC)   . Arthritis   . History of blood transfusion   . GI bleed   . ESRD (end stage renal disease) (HCC)   . Seizure (HCC)   . Dialysis patient [facility])    Past Surgical History  Procedure Laterality Date  . Esophagogastroduodenoscopy  10/03/2012    Procedure: ESOPHAGOGASTRODUODENOSCOPY (EGD);  Surgeon: Theda Belfast, MD;  Location: [facility] ENDOSCOPY;  Service: Endoscopy;  Laterality: N/A;  . Colonoscopy  10/04/2012   Procedure: COLONOSCOPY;  Surgeon: Theda Belfast, MD;  Location: [facility] ENDOSCOPY;  Service: Endoscopy;  Laterality: N/A;  . Colonoscopy with esophagogastroduodenoscopy (egd) Left 11/30/2012    Procedure: COLONOSCOPY WITH ESOPHAGOGASTRODUODENOSCOPY (EGD);  Surgeon: Theda Belfast, MD;  Location: [facility] ENDOSCOPY;  Service: Endoscopy;  Laterality: Left;  . Spine surgery  2004    back fusion/ [facility] Penumalli,MD  . Retinal laser procedure Right 04/2015   Family History  Problem Relation Age of Onset  . Sudden death Mother   . Sudden death Father    Social History  Substance Use Topics  . Smoking status: Never Smoker   . Smokeless tobacco: Never Used  . Alcohol Use: No    Review of Systems  Constitutional: Negative for fever and chills.  Respiratory: Negative for cough and shortness of breath.   Cardiovascular: Negative for chest pain.  Gastrointestinal: Negative for nausea, vomiting, abdominal pain, diarrhea and constipation.  Genitourinary: Negative for dysuria.  Musculoskeletal: Positive for back pain and gait problem.  Skin: Negative for rash.  Neurological: Positive for weakness. Negative for facial asymmetry.      Allergies  Aspirin and Lactose intolerance (gi)  Home Medications   Prior to Admission medications   Medication Sig Start Date End Date Taking? Authorizing Provider  amLODipine (NORVASC) 10 MG tablet Take 10 mg by mouth Daily.  08/15/12  Yes Historical Provider, MD  BESIVANCE 0.6 % SUSP Place 1 drop into the right eye 4 (four) times daily as needed. For 2 days after each monthly injection  05/08/15  Yes Historical Provider, MD  calcitRIOL (ROCALTROL) 0.5 MCG capsule Take 2 capsules (1 mcg total) by mouth every Monday, Wednesday, and Friday with hemodialysis. 05/23/15  Yes Lonia Blood, MD  calcium acetate (PHOSLO) 667 MG capsule Take 2,001 mg by mouth 3 (three) times daily.  07/04/14  Yes Historical Provider, MD  colchicine 0.6 MG tablet as needed.  02/14/14  Yes  Historical Provider, MD  cyclobenzaprine (FLEXERIL) 5 MG tablet Take 5 mg by mouth daily as needed. 07/01/14  Yes Historical Provider, MD  darbepoetin (ARANESP) 60 MCG/0.3ML SOLN injection Inject 0.3 mLs (60 mcg total) into the vein every Thursday with hemodialysis. Patient taking differently: Inject 60 mcg into the vein every 7 (seven) days.  09/29/13  Yes Joseph Art, DO  fexofenadine (ALLEGRA) 180 MG tablet Take 180 mg by mouth daily. As needed   Yes Historical Provider, MD  folic acid (FOLVITE) 1 MG tablet Take 1 tablet (1 mg total) by mouth daily. 09/29/13  Yes Joseph Art, DO  gabapentin (NEURONTIN) 100 MG capsule Take 1 capsule (100 mg total) by mouth daily. 05/23/15  Yes Lonia Blood, MD  HUMALOG KWIKPEN 100 UNIT/ML KiwkPen Sliding Scale 04/16/15  Yes Historical Provider, MD  multivitamin (RENA-VIT) TABS tablet Take 1 tablet by mouth at bedtime. 09/29/13  Yes Jessica U Vann, DO  senna-docusate (SENOKOT-S) 8.6-50 MG per tablet Take 1 tablet by mouth. As needed   Yes Historical Provider, MD  triamcinolone (NASACORT ALLERGY 24HR) 55 MCG/ACT AERO nasal inhaler Place 1 spray into the nose. One spray nasal as needed for allery   Yes Historical Provider, MD  ACCU-CHEK AVIVA PLUS test strip Check blood sugar three times daily 04/20/14   Historical Provider, MD  lacosamide 100 MG TABS Take 1 tablet (100 mg total) by mouth 2 (two) times daily. 05/23/15   Lonia Blood, MD  sodium chloride 0.9 % SOLN 100 mL with ferric gluconate 12.5 MG/ML SOLN 62.5 mg Inject 62.5 mg into the vein every Thursday with hemodialysis. 09/29/13   Jessica U Vann, DO   BP 124/47 mmHg  Pulse 35  Temp(Src) 98 F (36.7 C) (Oral)  Resp 11  Ht 6\' 1"  (1.854 m)  Wt 210 lb (95.255 kg)  BMI 27.71 kg/m2  SpO2 100% Physical Exam  Constitutional: He is oriented to person, place, and time. He appears well-developed and well-nourished.  HENT:  Head: Normocephalic and atraumatic.  Eyes: Conjunctivae and EOM are normal.  Pupils are equal, round, and reactive to light.  Neck: Normal range of motion. Neck supple.  Cardiovascular: Normal rate, regular rhythm and intact distal pulses.   Murmur heard. Pulmonary/Chest: Effort normal and breath sounds normal. No respiratory distress. He has no wheezes.  Abdominal: Soft. Bowel sounds are normal. He exhibits no distension. There is no tenderness. There is no rebound.  Musculoskeletal:  No obvious deformity of LE Sensation intact b/l LE  Neurovascularly intact b/l LE  5/5 strength in UE b/l  Normal grip strength  Able to sit up in bed and sit on edge of bed but unable to stand on his own.  Ecchymosis on left greater trochanter   Neurological: He is alert and oriented to person, place, and time. No cranial nerve deficit or sensory deficit.  Normal finger to nose testing  Normal heel to shin 5/5 strength in LE b/l  +1 DTR's in patella and achilles    Skin: Skin is warm. No rash noted.  Psychiatric: He has a normal mood  and affect.    ED Course  Procedures (including critical care time) Labs Review Labs Reviewed  CBC WITH DIFFERENTIAL/PLATELET - Abnormal; Notable for the following:    WBC 3.8 (*)    RBC 3.13 (*)    Hemoglobin 10.2 (*)    HCT 31.5 (*)    MCV 100.6 (*)    Platelets 89 (*)    All other components within normal limits  URINALYSIS, ROUTINE W REFLEX MICROSCOPIC (NOT AT [facility]) - Abnormal; Notable for the following:    Glucose, UA 100 (*)    Hgb urine dipstick TRACE (*)    Protein, ur 100 (*)    All other components within normal limits  I-STAT CHEM 8, ED - Abnormal; Notable for the following:    Potassium 5.6 (*)    BUN 83 (*)    Creatinine, Ser 9.20 (*)    Glucose, Bld 152 (*)    Hemoglobin 11.6 (*)    HCT 34.0 (*)    All other components within normal limits  URINE MICROSCOPIC-ADD ON  RENAL FUNCTION PANEL    Imaging Review Ct Head Wo Contrast  06/20/2015  CLINICAL DATA:  General body weakness, unable to get out of bed this  morning. EXAM: CT HEAD WITHOUT CONTRAST TECHNIQUE: Contiguous axial images were obtained from the base of the skull through the vertex without intravenous contrast. COMPARISON:  05/21/2015 FINDINGS: Isodense to hypodense small left subdural hematoma, 5 mm in thickness. This is stable size when compared to prior study with evolutionary changes. No new hemorrhage. There is atrophy and chronic small vessel disease changes. No acute infarct. No hydrocephalus. No acute calvarial abnormality. IMPRESSION: Stable size of the small left subdural hematoma with evolutionary changes. Atrophy, chronic small vessel disease. Electronically Signed   By: Charlett Nose M.D.   On: 06/20/2015 09:07   Dg Hip Unilat With Pelvis 2-3 Views Left  06/20/2015  CLINICAL DATA:  Chronic left hip pain. Unable to get out of bed or bear weight. EXAM: DG HIP (WITH OR WITHOUT PELVIS) 2-3V LEFT COMPARISON:  None. FINDINGS: Degenerative changes are noted in the lower lumbar spine. The hips are located bilaterally. No acute fracture is present. Extensive atherosclerotic changes are present. IMPRESSION: 1. Degenerative changes in the left hip. No acute abnormality. The patient is unable bear weight, CT or preferably MRI could be used for further evaluation of occult fracture. 2. Atherosclerosis. Electronically Signed   By: Marin Roberts M.D.   On: 06/20/2015 10:57   I have personally reviewed and evaluated these images and lab results as part of my medical decision-making.   EKG Interpretation None      Medications  traMADol (ULTRAM) tablet 50 mg (50 mg Oral Given 06/20/15 1147)    MDM   Final diagnoses:  Weakness   Mr. Heinz is a 81 yo M PMH HTN, DM2, ESRD on dialysis M,W,F , seizure d/o, chronic back pain and subdural hematoma.  that is presenting with weakness. He fell on his left hip but is unsure of when. He does have ecchymosis over the left greater trochanter. Hip x-rays were not revealing for fracture but he is unable  to bear weight on his left leg.  CT head showing a stable subdural hematoma. Muscle strength and reflexes in lower extremities are equal. Able to sit on the side of the bed but unable to stand. Given dose of tramadol but doesn't seem to improve his pain. Has chronic back pain at baseline but unable to determine if pain  is coming from left hip or lower back. MRI of lumbar and left are pending as patient was signed out to Dr. Loretha Stapler.   Myra Rude, MD PGY-3, [facility] 06/20/2015, 1:32 PM      Myra Rude, MD 06/20/15 1333  Blake Divine, MD 06/20/15 [phone]

## 2015-06-20 NOTE — ED Notes (Signed)
Son wishes to be contacted with updates.  Son reports that mother suffers from Alzheimer's and he wishes to be updated with changes. [phone].

## 2015-06-20 NOTE — ED Notes (Signed)
Assisted Resident MD with attempting to stand patient

## 2015-06-20 NOTE — Consult Note (Signed)
Reason for Consult: To manage dialysis and dialysis related needs Referring Physician: Unknown  [name] is an 81 y.o. male with past medical history significant for hypertension, diabetes mellitus, seizure disorder as well as end-stage renal disease. He is status post a hospitalization from September 12 to September 16 after a fall and subdural hematoma. He also spent some time in a skilled nursing facility and was discharged from there on September 23. He presents the emergency department today. He reports that he slept well but when he got up this morning he was "unable" to move his left leg. He's unable to describe it in any more detail than that. Head CT showed stable subdural atrophic changes but nothing new- he is still in the ER on to get more of a workup to include an MRI of the spine and hip. Today is his dialysis day- potassium is 5.6 and BUN is 83. [name] is known to shorten his treatment about every time and usually only gets about 2 and half hours of his 4 hour treatment. He claims he cannot sit that long secondary to back pain.     Dialyzes at [facility] Monday, Wednesday and Friday -  EDW 91 kg. HD Bath 2K/2.25 calcium, Dialyzer 180, Heparin no. Access AV fistula on the right Mercira  150 given on October 12, calcitriol 1.25  3 times a week. Sensipar 60/PhosLo 4- last hemoglobin 10.1, phosphorus 4.3 PTH 371  Past Medical History  Diagnosis Date  . Hypertension   . Diabetes mellitus without complication (HCC)   . Arthritis   . History of blood transfusion   . GI bleed   . ESRD (end stage renal disease) (HCC)   . Seizure (HCC)   . Dialysis patient [facility])     Past Surgical History  Procedure Laterality Date  . Esophagogastroduodenoscopy  10/03/2012    Procedure: ESOPHAGOGASTRODUODENOSCOPY (EGD);  Surgeon: Theda Belfast, MD;  Location: [facility] ENDOSCOPY;  Service: Endoscopy;  Laterality: N/A;  . Colonoscopy  10/04/2012    Procedure: COLONOSCOPY;  Surgeon: Theda Belfast, MD;   Location: [facility] ENDOSCOPY;  Service: Endoscopy;  Laterality: N/A;  . Colonoscopy with esophagogastroduodenoscopy (egd) Left 11/30/2012    Procedure: COLONOSCOPY WITH ESOPHAGOGASTRODUODENOSCOPY (EGD);  Surgeon: Theda Belfast, MD;  Location: [facility] ENDOSCOPY;  Service: Endoscopy;  Laterality: Left;  . Spine surgery  2004    back fusion/ [facility] Penumalli,MD  . Retinal laser procedure Right 04/2015    Family History  Problem Relation Age of Onset  . Sudden death Mother   . Sudden death Father     Social History:  reports that he has never smoked. He has never used smokeless tobacco. He reports that he does not drink alcohol or use illicit drugs.  Allergies:  Allergies  Allergen Reactions  . Aspirin Other (See Comments)    Bleeding   . Lactose Intolerance (Gi) Diarrhea    Medications: I have reviewed the patient's current medications.   Results for orders placed or performed during the hospital encounter of 06/20/15 (from the past 48 hour(s))  CBC with Differential     Status: Abnormal   Collection Time: 06/20/15  8:09 AM  Result Value Ref Range   WBC 3.8 (L) 4.0 - 10.5 K/uL   RBC 3.13 (L) 4.22 - 5.81 MIL/uL   Hemoglobin 10.2 (L) 13.0 - 17.0 g/dL   HCT 76.1 (L) 95.0 - 93.2 %   MCV 100.6 (H) 78.0 - 100.0 fL   MCH 32.6 26.0 - 34.0 pg  MCHC 32.4 30.0 - 36.0 g/dL   RDW 24.0 97.3 - 53.2 %   Platelets 89 (L) 150 - 400 K/uL    Comment: PLATELET COUNT CONFIRMED BY SMEAR   Neutrophils Relative % 59 %   Neutro Abs 2.2 1.7 - 7.7 K/uL   Lymphocytes Relative 28 %   Lymphs Abs 1.1 0.7 - 4.0 K/uL   Monocytes Relative 7 %   Monocytes Absolute 0.3 0.1 - 1.0 K/uL   Eosinophils Relative 6 %   Eosinophils Absolute 0.2 0.0 - 0.7 K/uL   Basophils Relative 0 %   Basophils Absolute 0.0 0.0 - 0.1 K/uL  I-stat Chem 8, ED     Status: Abnormal   Collection Time: 06/20/15  8:10 AM  Result Value Ref Range   Sodium 136 135 - 145 mmol/L   Potassium 5.6 (H) 3.5 - 5.1 mmol/L   Chloride 102 101 - 111 mmol/L    BUN 83 (H) 6 - 20 mg/dL   Creatinine, Ser 9.92 (H) 0.61 - 1.24 mg/dL   Glucose, Bld 426 (H) 65 - 99 mg/dL   Calcium, Ion 8.34 1.96 - 1.30 mmol/L   TCO2 24 0 - 100 mmol/L   Hemoglobin 11.6 (L) 13.0 - 17.0 g/dL   HCT 22.2 (L) 97.9 - 89.2 %  Urinalysis, Routine w reflex microscopic     Status: Abnormal   Collection Time: 06/20/15  8:45 AM  Result Value Ref Range   Color, Urine YELLOW YELLOW   APPearance CLEAR CLEAR   Specific Gravity, Urine 1.011 1.005 - 1.030   pH 8.0 5.0 - 8.0   Glucose, UA 100 (A) NEGATIVE mg/dL   Hgb urine dipstick TRACE (A) NEGATIVE   Bilirubin Urine NEGATIVE NEGATIVE   Ketones, ur NEGATIVE NEGATIVE mg/dL   Protein, ur 119 (A) NEGATIVE mg/dL   Urobilinogen, UA 0.2 0.0 - 1.0 mg/dL   Nitrite NEGATIVE NEGATIVE   Leukocytes, UA NEGATIVE NEGATIVE  Urine microscopic-add on     Status: None   Collection Time: 06/20/15  8:45 AM  Result Value Ref Range   Squamous Epithelial / LPF RARE RARE   WBC, UA 0-2 <3 WBC/hpf   RBC / HPF 0-2 <3 RBC/hpf   Bacteria, UA RARE RARE    Ct Head Wo Contrast  06/20/2015  CLINICAL DATA:  General body weakness, unable to get out of bed this morning. EXAM: CT HEAD WITHOUT CONTRAST TECHNIQUE: Contiguous axial images were obtained from the base of the skull through the vertex without intravenous contrast. COMPARISON:  05/21/2015 FINDINGS: Isodense to hypodense small left subdural hematoma, 5 mm in thickness. This is stable size when compared to prior study with evolutionary changes. No new hemorrhage. There is atrophy and chronic small vessel disease changes. No acute infarct. No hydrocephalus. No acute calvarial abnormality. IMPRESSION: Stable size of the small left subdural hematoma with evolutionary changes. Atrophy, chronic small vessel disease. Electronically Signed   By: Charlett Nose M.D.   On: 06/20/2015 09:07   Dg Hip Unilat With Pelvis 2-3 Views Left  06/20/2015  CLINICAL DATA:  Chronic left hip pain. Unable to get out of bed or bear  weight. EXAM: DG HIP (WITH OR WITHOUT PELVIS) 2-3V LEFT COMPARISON:  None. FINDINGS: Degenerative changes are noted in the lower lumbar spine. The hips are located bilaterally. No acute fracture is present. Extensive atherosclerotic changes are present. IMPRESSION: 1. Degenerative changes in the left hip. No acute abnormality. The patient is unable bear weight, CT or preferably MRI could be used  for further evaluation of occult fracture. 2. Atherosclerosis. Electronically Signed   By: Marin Roberts M.D.   On: 06/20/2015 10:57    ROS: Unable to get up or bear weight on his left leg, some kind of the left finger issue is well. Patient denies trauma. He denies other complaints just saying that he feels bad   Blood pressure 124/47, pulse 35, temperature 98 F (36.7 C), temperature source Oral, resp. rate 11, height 6\' 1"  (1.854 m), weight 95.255 kg (210 lb), SpO2 100 %. Elderly black male who is not as talkative as I'm used to seeing. However, I haven't seen him since his subdural hematoma on previous hospitalization. HEENT: Pupils are equal round reactive to light, extra ocular motions are intact, mucous membranes moist. Lungs: Clear to auscultation. Cardiovascular- at times bradycardic. Abdomen: Soft and nontender. Extremities: No obvious edema. Right upper arm AV fistula with good thrill and bruit. Neurologically he's little slow to respond   Assessment/Plan: 81 year old black male with recent hospitalization for subdural hematoma requiring rehabilitation stay. He now presents with left leg pain/weakness  1 left leg pain/weakness - so far head CT is unremarkable as well as left hip x-ray.  Awaiting hip and back MRI . Anticipate he will lobe most likely require admission for this issue as he cannot get up. Therefore, we'll go ahead and arrange dialysis  2 ESRD: Normally Monday Wednesday Friday at [facility] via AV fistula.  He chronically signs off so is under dialyzed. Will need dialysis today  3  Hypertension: Not a significant issue at this time.  Not sure about this bradycardia if it's new. Would hold his amlodipine?  4. Anemia of ESRD: Hemoglobin stable from the outpatient center - received ESA on October 12. Nothing needed right now  5. Metabolic Bone Disease: Will make sure he continues his outpatient calcitriol 1.25  3 times a week, PhosLo 4 with meals and Sensipar 60 daily   Makynna Manocchio A 06/20/2015, 2:00 PM

## 2015-06-20 NOTE — Progress Notes (Signed)
   06/20/15 2111  Vitals  Temp 97.4 F (36.3 C)  Temp Source Oral  BP (!) 159/63 mmHg  BP Location Left Arm  BP Method Automatic  Patient Position (if appropriate) Lying  Pulse Rate 78  Pulse Rate Source Dinamap  Resp 16  Oxygen Therapy  SpO2 100 %  O2 Device Room Air  Height and Weight  Height 6\' 1"  (1.854 m)  Weight 92.216 kg (203 lb 4.8 oz)  BSA (Calculated - sq m) 2.18 sq meters  BMI (Calculated) 26.9  Weight in (lb) to have BMI = 25 189.1  Admitted pt to rm 3E13 from hemodialysis, pt alert but very confused and restless, pt only answers yes or no questions, pt only oriented to self. Bed alarm placed at all times.

## 2015-06-20 NOTE — ED Notes (Signed)
Pt HR noted to be 38 when sleeping, EKG obtained and given to MD.

## 2015-06-20 NOTE — ED Notes (Signed)
Patient transported to CT 

## 2015-06-20 NOTE — ED Notes (Signed)
Report given to dialysis, [place]. Dr. Loretha Stapler is going to consult and get patient admitted. Pt is going to dialysis prior to going to MRI.

## 2015-06-20 NOTE — ED Notes (Signed)
Pt in EMS from home, reporting back pain, generalized weakness, tremors in arms and legs. Incontinent of urine. Recently in rehab for back/leg pain. Neuro assessment intact en route

## 2015-06-20 NOTE — H&P (Signed)
History and Physical  [name]  TGG:[id number]  DOB: 1932-01-01  DOA: 06/20/2015  Referring physician: None PCP: [name], DO   Chief Complaint: Weakness and fall  HPI: [name] is a 81 y.o. male with a past medical history significant for ESRD on HD MWF, HTN, DM c/b neuropathy, and recent SDH who presents with fall.  The patient had a fall on September 12 and was admitted to [facility]. At that time he had SDH noted and this was treated nonoperatively with serial CT scans. Evidently during that hospitalization, the patient had word finding difficulties after a dialysis session and was evaluated by neurology, had an EEG that was consistent with seizure activity related to SDH and had his Vimpat increased from 50-100 mg twice daily. After that hospitalization the patient was at [facility] 1 week for rehabilitation, and then has been home for the last 3 weeks doing PT/OT at home.   All history is collected from the patient's wife [name], and nephrology attending who is familiar with the patient.   This morning coming out of the bathroom, the patient fell again. He was brought to the ER where he was bradycardic in the 40s with initially a sinus rhythm and then a junctional bradycardia. His potassium was 5.6 and so he was taken for dialysis. There was a question of leg pain, and radiograph of the hip was normal but an MRI was recommended. CT of the head showed stable SDH. The patient's wife tells me that he had no complaints of pain, or confusion, or fever today.  On my evaluation the patient is confused, asking for his wife, and has no complaints of pain, chest discomfort, weakness, palpitations.     Review of Systems:  Patient seen 9:15 PM on 06/20/2015. The patient denies all symptoms, but he does not appear to be a reliable historian at this time.  Past Medical History  Diagnosis Date  . Hypertension   . Diabetes mellitus without complication (HCC)   . Arthritis   .  History of blood transfusion   . GI bleed   . ESRD (end stage renal disease) (HCC)   . Seizure (HCC)   . Dialysis patient [facility])   The above past medical history was reviewed.  Past Surgical History  Procedure Laterality Date  . Esophagogastroduodenoscopy  10/03/2012    Procedure: ESOPHAGOGASTRODUODENOSCOPY (EGD);  Surgeon: Theda Belfast, MD;  Location: [facility] ENDOSCOPY;  Service: Endoscopy;  Laterality: N/A;  . Colonoscopy  10/04/2012    Procedure: COLONOSCOPY;  Surgeon: Theda Belfast, MD;  Location: [facility] ENDOSCOPY;  Service: Endoscopy;  Laterality: N/A;  . Colonoscopy with esophagogastroduodenoscopy (egd) Left 11/30/2012    Procedure: COLONOSCOPY WITH ESOPHAGOGASTRODUODENOSCOPY (EGD);  Surgeon: Theda Belfast, MD;  Location: [facility] ENDOSCOPY;  Service: Endoscopy;  Laterality: Left;  . Spine surgery  2004    back fusion/ [facility] Penumalli,MD  . Retinal laser procedure Right 04/2015  The above surgical history was reviewed.  Social History: Patient lives with his wife. He normally walks with a walker, and climb stairs using a chair left. He is dependent for IADLs, but independent with all ADLs. He is a nonsmoker.    Allergies  Allergen Reactions  . Aspirin Other (See Comments)    Bleeding   . Lactose Intolerance (Gi) Diarrhea    Family History  Problem Relation Age of Onset  . Sudden death Mother   . Sudden death Father     Prior to Admission medications   Medication Sig Start  Date End Date Taking? Authorizing Provider  amLODipine (NORVASC) 10 MG tablet Take 10 mg by mouth Daily.  08/15/12  Yes Historical Provider, MD  BESIVANCE 0.6 % SUSP Place 1 drop into the right eye 4 (four) times daily as needed. For 2 days after each monthly injection 05/08/15  Yes Historical Provider, MD  calcitRIOL (ROCALTROL) 0.5 MCG capsule Take 2 capsules (1 mcg total) by mouth every Monday, Wednesday, and Friday with hemodialysis. 05/23/15  Yes Lonia Blood, MD  calcium acetate (PHOSLO) 667 MG capsule Take  2,001 mg by mouth 3 (three) times daily.  07/04/14  Yes Historical Provider, MD  colchicine 0.6 MG tablet as needed.  02/14/14  Yes Historical Provider, MD  cyclobenzaprine (FLEXERIL) 5 MG tablet Take 5 mg by mouth daily as needed. 07/01/14  Yes Historical Provider, MD  darbepoetin (ARANESP) 60 MCG/0.3ML SOLN injection Inject 0.3 mLs (60 mcg total) into the vein every Thursday with hemodialysis. Patient taking differently: Inject 60 mcg into the vein every 7 (seven) days.  09/29/13  Yes Joseph Art, DO  fexofenadine (ALLEGRA) 180 MG tablet Take 180 mg by mouth daily. As needed   Yes Historical Provider, MD  folic acid (FOLVITE) 1 MG tablet Take 1 tablet (1 mg total) by mouth daily. 09/29/13  Yes Joseph Art, DO  gabapentin (NEURONTIN) 100 MG capsule Take 1 capsule (100 mg total) by mouth daily. 05/23/15  Yes Lonia Blood, MD  HUMALOG KWIKPEN 100 UNIT/ML KiwkPen Sliding Scale 04/16/15  Yes Historical Provider, MD  multivitamin (RENA-VIT) TABS tablet Take 1 tablet by mouth at bedtime. 09/29/13  Yes Jessica U Vann, DO  senna-docusate (SENOKOT-S) 8.6-50 MG per tablet Take 1 tablet by mouth. As needed   Yes Historical Provider, MD  triamcinolone (NASACORT ALLERGY 24HR) 55 MCG/ACT AERO nasal inhaler Place 1 spray into the nose. One spray nasal as needed for allery   Yes Historical Provider, MD  ACCU-CHEK AVIVA PLUS test strip Check blood sugar three times daily 04/20/14   Historical Provider, MD  lacosamide 100 MG TABS Take 1 tablet (100 mg total) by mouth 2 (two) times daily. 05/23/15   Lonia Blood, MD  sodium chloride 0.9 % SOLN 100 mL with ferric gluconate 12.5 MG/ML SOLN 62.5 mg Inject 62.5 mg into the vein every Thursday with hemodialysis. 09/29/13   Joseph Art, DO    Physical Exam: BP 159/63 mmHg  Pulse 78  Temp(Src) 97.4 F (36.3 C) (Oral)  Resp 16  Ht 6\' 1"  (1.854 m)  Wt 92.216 kg (203 lb 4.8 oz)  BMI 26.83 kg/m2  SpO2 100% General appearance: Elderly adult male, curled up in  bed. In no obvious pain. Makes eye contact and answers questions but is confused.   Eyes: Sclerae normal without icterus, conjunctiva pink, lids and lashes normal.   Nose: No deformity, discharge, or epistaxis.   Mouth: OP moist, without ulcers.    Skin: Warm and dry.  No jaundice.  No suspicious rashes or lesions. Cardiac: RRR at this time, nl S1-S2, no murmurs appreciated.  JVP normal.  No LE edema.   Respiratory: Normal respiratory rate and rhythm.  CTAB without rales or wheezes. Abdomen: BS present.  Abdomen soft without rigidity.   Neuro: Unable to state his name, where he is, or what is going on. He makes eye contact and follows simple commands intermittently. Attention impaired. The muscle bulk is diffusely diminished. Speech seems fluent. He moves all extremities equally.    Psych: Appropriate affect.  No evidence of aural or visual hallucinations or delusions.       Labs on Admission:  The metabolic panel from earlier today is notable for potassium 5.6 mmol/L, normal bicarbonate. The serum creatinine is 9.2 mg/dL. The complete blood count is notable for stable pancytopenia.    Radiological Exams on Admission: Ct Head Wo Contrast 06/20/2015   IMPRESSION: Stable size of the small left subdural hematoma with evolutionary changes. Atrophy, chronic small vessel disease   Dg Hip Unilat With Pelvis 2-3 Views Left 06/20/2015  IMPRESSION: 1. Degenerative changes in the left hip. No acute abnormality. The patient is unable bear weight, CT or preferably MRI could be used for further evaluation of occult fracture. 2. Atherosclerosis.     EKG: Independently reviewed. The initial ECG shows sinus bradycardia without ST changes. Repeat shows a junctional bradycardia.    Assessment/Plan  1. Bradycardia:  This is new.  Suspect related to potassium.  View of all previous ECGs shows no previous bradycardia.  -Telemetry -Repeat ECG -If recurs will institute external pacing and discussed  with cardiology tonight -Consult to cardiology  2. Altered mental status: The patient has postdialysis altered mental status. This was discussed with neurology on-call, who agree that the patient's global change in mental status without focal signs suggests some SDH related cognitive deficits. Metabolically, the patient should be stable after dialysis. We will rule out sepsis. Suspect underlying MCI and sundowning. -Repeat CMP, magnesium, and his calcium -Repeat ECG -Blood cultures are drawn -If fever, will start broad-spectrum antibiotics -Repeat MRI is ordered -Continue home Vimpat  3. ESRD on HD:  Stable.  Patient is very undergone dialysis tonight. -Consult to nephrology, appreciate recommendations -Continue phos binders and renal supplements and Aranesp  4. SDH:  Stable by CT.  See #2 above.  5. HTN:  Stable. -Continue home amlodipine  5. DM:  Stable.  -Sliding scale corrections  6. Pancytopenia:  Stable.  -Repeat CBC  7. Leg pain:  Stable. Patient has no complaint of leg pain or discernible weakness at this time.  -Wll defer MRI     DVT PPx: SCDs Diet: Renal Consultants: Neurology, Nephrology, Cardiology Code Status: Full Family Communication: The patient's differential diagnosis, expected treatment, and consultations were discussed with his wife [name]. CODE STATUS was confirmed.   Disposition Plan:  At the point of initial evaluation, it is my clinical opinion that admission for OBSERVATION is reasonable and necessary because the patient's presenting complaints in the context of their chronic conditions represent sufficient risk of deterioration or significant morbidity to constitute reasonable grounds for close observation in the hospital setting, but that the patient may be medically stable for discharge from the hospital within 24 to 48 hours.       Alberteen Sam [facility] Pager [phone]

## 2015-06-21 ENCOUNTER — Observation Stay (HOSPITAL_COMMUNITY): Payer: Medicare Other

## 2015-06-21 DIAGNOSIS — R4182 Altered mental status, unspecified: Secondary | ICD-10-CM | POA: Diagnosis not present

## 2015-06-21 DIAGNOSIS — R001 Bradycardia, unspecified: Secondary | ICD-10-CM | POA: Diagnosis not present

## 2015-06-21 DIAGNOSIS — N186 End stage renal disease: Secondary | ICD-10-CM

## 2015-06-21 DIAGNOSIS — D61818 Other pancytopenia: Secondary | ICD-10-CM

## 2015-06-21 DIAGNOSIS — G629 Polyneuropathy, unspecified: Secondary | ICD-10-CM

## 2015-06-21 DIAGNOSIS — G9340 Encephalopathy, unspecified: Secondary | ICD-10-CM

## 2015-06-21 DIAGNOSIS — Z992 Dependence on renal dialysis: Secondary | ICD-10-CM

## 2015-06-21 DIAGNOSIS — G40909 Epilepsy, unspecified, not intractable, without status epilepticus: Secondary | ICD-10-CM | POA: Diagnosis not present

## 2015-06-21 DIAGNOSIS — R627 Adult failure to thrive: Secondary | ICD-10-CM | POA: Diagnosis not present

## 2015-06-21 DIAGNOSIS — I6200 Nontraumatic subdural hemorrhage, unspecified: Secondary | ICD-10-CM | POA: Diagnosis not present

## 2015-06-21 LAB — COMPREHENSIVE METABOLIC PANEL
ALBUMIN: 4.1 g/dL (ref 3.5–5.0)
ALT: 15 U/L — ABNORMAL LOW (ref 17–63)
ANION GAP: 12 (ref 5–15)
AST: 19 U/L (ref 15–41)
Alkaline Phosphatase: 43 U/L (ref 38–126)
BUN: 38 mg/dL — ABNORMAL HIGH (ref 6–20)
CO2: 27 mmol/L (ref 22–32)
Calcium: 9 mg/dL (ref 8.9–10.3)
Chloride: 96 mmol/L — ABNORMAL LOW (ref 101–111)
Creatinine, Ser: 5.89 mg/dL — ABNORMAL HIGH (ref 0.61–1.24)
GFR calc non Af Amer: 8 mL/min — ABNORMAL LOW (ref >60)
GFR, EST AFRICAN AMERICAN: 9 mL/min — AB (ref >60)
GLUCOSE: 147 mg/dL — AB (ref 65–99)
POTASSIUM: 4.4 mmol/L (ref 3.5–5.1)
SODIUM: 135 mmol/L (ref 135–145)
TOTAL PROTEIN: 7.1 g/dL (ref 6.5–8.1)
Total Bilirubin: 0.7 mg/dL (ref 0.3–1.2)

## 2015-06-21 LAB — GLUCOSE, CAPILLARY
GLUCOSE-CAPILLARY: 119 mg/dL — AB (ref 65–99)
GLUCOSE-CAPILLARY: 201 mg/dL — AB (ref 65–99)
GLUCOSE-CAPILLARY: 155 mg/dL — AB (ref 65–99)

## 2015-06-21 LAB — TSH: TSH: 1.607 u[IU]/mL (ref 0.350–4.500)

## 2015-06-21 LAB — MAGNESIUM: Magnesium: 2.5 mg/dL — ABNORMAL HIGH (ref 1.7–2.4)

## 2015-06-21 LAB — AMMONIA: Ammonia: 27 umol/L (ref 9–35)

## 2015-06-21 LAB — VITAMIN B12: Vitamin B-12: 1029 pg/mL — ABNORMAL HIGH (ref 180–914)

## 2015-06-21 MED ORDER — CINACALCET HCL 30 MG PO TABS
60.0000 mg | ORAL_TABLET | Freq: Every day | ORAL | Status: DC
  Administered 2015-06-21 – 2015-06-22 (×2): 60 mg via ORAL
  Filled 2015-06-21 (×2): qty 2

## 2015-06-21 NOTE — Consult Note (Signed)
Primary Care Physician: [name], DO  Admit Date: 06/20/2015  Reason for consultation: bradycardia  [name] is a 81 y.o. male with a h/o ESRD on HD MWF, HTN, DM w/ neuropathy, and recent SDH.  The patient presented to the hospital after a fall and unable to get up from the bed.  He did not lose consciousness.  On presentation to the ER, his HR was in the 40s in sinus, then junctional bradycardia.  His K was 5.6 and he was dialyzed at that time.  Since then, he has had no further episodes of bradycardia.   Today, he denies symptoms of palpitations, chest pain, shortness of breath, orthopnea, PND, lower extremity edema, dizziness, presyncope, syncope, or neurologic sequela. The patient is tolerating medications without difficulties and is otherwise without complaint today.  He states that he has always had a slow HR without issues.    Past Medical History  Diagnosis Date  . Hypertension   . Diabetes mellitus without complication (HCC)   . Arthritis   . History of blood transfusion   . GI bleed   . ESRD (end stage renal disease) (HCC)   . Seizure (HCC)   . Dialysis patient [facility])    Past Surgical History  Procedure Laterality Date  . Esophagogastroduodenoscopy  10/03/2012    Procedure: ESOPHAGOGASTRODUODENOSCOPY (EGD);  Surgeon: Theda Belfast, MD;  Location: [facility] ENDOSCOPY;  Service: Endoscopy;  Laterality: N/A;  . Colonoscopy  10/04/2012    Procedure: COLONOSCOPY;  Surgeon: Theda Belfast, MD;  Location: [facility] ENDOSCOPY;  Service: Endoscopy;  Laterality: N/A;  . Colonoscopy with esophagogastroduodenoscopy (egd) Left 11/30/2012    Procedure: COLONOSCOPY WITH ESOPHAGOGASTRODUODENOSCOPY (EGD);  Surgeon: Theda Belfast, MD;  Location: [facility] ENDOSCOPY;  Service: Endoscopy;  Laterality: Left;  . Spine surgery  2004    back fusion/ [facility] Penumalli,MD  . Retinal laser procedure Right 04/2015    . amLODipine  10 mg Oral Daily  . [START ON 06/23/2015] calcitRIOL  1 mcg Oral Q M,W,F-HD  . calcium  acetate  2,001 mg Oral TID WC  . cinacalcet  60 mg Oral Q supper  . [START ON 06/26/2015] darbepoetin (ARANESP) injection - DIALYSIS  60 mcg Intravenous Q Thu-HD  . folic acid  1 mg Oral Daily  . gabapentin  100 mg Oral Daily  . insulin aspart  0-5 Units Subcutaneous QHS  . insulin aspart  0-9 Units Subcutaneous TID WC  . lacosamide  100 mg Oral BID  . loratadine  10 mg Oral Daily  . multivitamin  1 tablet Oral QHS  . sodium chloride  3 mL Intravenous Q12H      Allergies  Allergen Reactions  . Aspirin Other (See Comments)    Bleeding   . Lactose Intolerance (Gi) Diarrhea    Social History   Social History  . Marital Status: Married    Spouse Name: patricia  . Number of Children: 2  . Years of Education: PHD   Occupational History  . Retired    Social History Main Topics  . Smoking status: Never Smoker   . Smokeless tobacco: Never Used  . Alcohol Use: No  . Drug Use: No  . Sexual Activity: No   Other Topics Concern  . Not on file   Social History Narrative   Patient currently in [facility] rehab.   Caffeine Use: 1-2 cups occasionally    Family History  Problem Relation Age of Onset  . Sudden death Mother   .  Sudden death Father     ROS- All systems are reviewed and negative except as per the HPI above  Physical Exam: Telemetry: Filed Vitals:   06/20/15 2011 06/20/15 2111 06/20/15 2359 06/21/15 0443  BP: 137/70 159/63 152/67 129/65  Pulse: 77 78 79 71  Temp: 98.1 F (36.7 C) 97.4 F (36.3 C) 98 F (36.7 C)   TempSrc: Oral Oral Oral   Resp: 15 16 16 16   Height:  6\' 1"  (1.854 m)    Weight:  203 lb 4.8 oz (92.216 kg)  191 lb 11.2 oz (86.955 kg)  SpO2:  100% 99%     GEN- The patient is well appearing, alert and oriented x 3 today.   Head- normocephalic, atraumatic Eyes-  Sclera clear, conjunctiva pink Ears- hearing intact Oropharynx- clear Neck- supple, no JVP Lymph- no cervical lymphadenopathy Lungs- Clear to ausculation  bilaterally, normal work of breathing Heart- Regular rate and rhythm, no murmurs, rubs or gallops, PMI not laterally displaced GI- soft, NT, ND, + BS Extremities- no clubbing, cyanosis, or edema MS- no significant deformity or atrophy Skin- no rash or lesion Psych- euthymic mood, full affect Neuro- strength and sensation are intact  EKG-  Labs:   Lab Results  Component Value Date   WBC 3.8* 06/20/2015   HGB 11.6* 06/20/2015   HCT 34.0* 06/20/2015   MCV 100.6* 06/20/2015   PLT 89* 06/20/2015    Recent Labs Lab 06/20/15 2318  NA 135  K 4.4  CL 96*  CO2 27  BUN 38*  CREATININE 5.89*  CALCIUM 9.0  PROT 7.1  BILITOT 0.7  ALKPHOS 43  ALT 15*  AST 19  GLUCOSE 147*   Lab Results  Component Value Date   TROPONINI <0.30 01/13/2013    Lab Results  Component Value Date   CHOL 111 05/20/2015   CHOL 107 05/13/2015   Lab Results  Component Value Date   HDL 53 05/20/2015   HDL 66 05/13/2015   Lab Results  Component Value Date   LDLCALC 45 05/20/2015   LDLCALC 31 05/13/2015   Lab Results  Component Value Date   TRIG 63 05/20/2015   TRIG 48 05/13/2015   Lab Results  Component Value Date   CHOLHDL 2.1 05/20/2015   CHOLHDL 1.6 05/13/2015   No results found for: LDLDIRECT    Radiology: CT head  Stable size of the small left subdural hematoma with evolutionary changes.  Atrophy, chronic small vessel disease.  Echo 2014: - Left ventricle: The cavity size was normal. Wall thickness was increased in a pattern of mild LVH. Systolic function was normal. The estimated ejection fraction was in the range of 55% to 60%. Wall motion was normal; there were no regional wall motion abnormalities. Doppler parameters are consistent with abnormal left ventricular relaxation (grade 1 diastolic dysfunction). - Aortic valve: Moderate regurgitation. - Mitral valve: Calcified annulus. Mildly thickened leaflets . Mild regurgitation. - Left atrium: The atrium  was severely dilated. - Right atrium: The atrium was mildly dilated. - Pulmonary arteries: Systolic pressure was severely increased. PA peak pressure: 58mm Hg (S).  ASSESSMENT AND PLAN:   Sinus/junctional bradycardia: Uncertain if this was due to his renal failure and elevated K.  He has not had any further bradycardia since having dialysis.  He says that he feels much improved since dialysis on admission.  Discussed pacemaker with the patient and he states that he would rather watch and wait to see if bradycardia recurs and if he has symptoms.  This is probably reasonable due to the high risk of device infection in a dialysis patient.  Rosaria Kubin continue to monitor.   Danae Oland Jorja Loa, MD 06/21/2015  1:46 PM

## 2015-06-21 NOTE — Progress Notes (Signed)
EEG completed, results pending. 

## 2015-06-21 NOTE — Progress Notes (Signed)
Pt refused to check his blood sugar, pt confused and combative.

## 2015-06-21 NOTE — Progress Notes (Signed)
Subjective:  S/p dialysis last night- removed 1500- tolerated well- getting an EEG right now- seems less responsive than yesterday- not responding to direct questions- mumbling Objective Vital signs in last 24 hours: Filed Vitals:   06/20/15 2011 06/20/15 2111 06/20/15 2359 06/21/15 0443  BP: 137/70 159/63 152/67 129/65  Pulse: 77 78 79 71  Temp: 98.1 F (36.7 C) 97.4 F (36.3 C) 98 F (36.7 C)   TempSrc: Oral Oral Oral   Resp: 15 16 16 16   Height:  6\' 1"  (1.854 m)    Weight:  92.216 kg (203 lb 4.8 oz)  86.955 kg (191 lb 11.2 oz)  SpO2:  100% 99%    Weight change:   Intake/Output Summary (Last 24 hours) at 06/21/15 0839 Last data filed at 06/21/15 0643  Gross per 24 hour  Intake    120 ml  Output   2201 ml  Net  -2081 ml   Dialyzes at [facility] Monday, Wednesday and Friday - EDW 91 kg. HD Bath 2K/2.25 calcium, Dialyzer 180, Heparin no. Access AV fistula on the right Mercira 150 given on October 12, calcitriol 1.25 3 times a week. Sensipar 60/PhosLo 4- last hemoglobin 10.1, phosphorus 4.3 PTH 371   Assessment/Plan: 81 year old black male with recent hospitalization for subdural hematoma requiring rehabilitation stay. He now presents with left leg pain/weakness  1 left leg pain/weakness/dec MS - so far head CT is unremarkable as well as left hip x-ray.Now looking for a possible seizure etiology ? Work up per primary and neuro- he will need PT assessment as well  2 ESRD: Normally Monday Wednesday Friday at [facility] via AV fistula. Done Friday, will plan next for Monday  3 Hypertension: Blood pressure reasonable and HR is better on amlodipine 4. Anemia of ESRD: Hemoglobin stable from the outpatient center - received ESA on October 12. Nothing needed right now  5. Metabolic Bone Disease: continue his outpatient calcitriol Sunday 3 times a week, PhosLo 3 with meals and Sensipar 60 daily  Dim Meisinger A    Labs: Basic Metabolic Panel:  Recent Labs Lab 06/20/15 0810  06/20/15 1630 06/20/15 2318  NA 136 133* 135  K 5.6* 5.0 4.4  CL 102 98* 96*  CO2  --  24 27  GLUCOSE 152* 228* 147*  BUN 83* 60* 38*  CREATININE 9.20* 7.27* 5.89*  CALCIUM  --  8.6* 9.0  PHOS  --  2.6  --    Liver Function Tests:  Recent Labs Lab 06/20/15 1630 06/20/15 2318  AST  --  19  ALT  --  15*  ALKPHOS  --  43  BILITOT  --  0.7  PROT  --  7.1  ALBUMIN 3.7 4.1   No results for input(s): LIPASE, AMYLASE in the last 168 hours. No results for input(s): AMMONIA in the last 168 hours. CBC:  Recent Labs Lab 06/20/15 0809 06/20/15 0810  WBC 3.8*  --   NEUTROABS 2.2  --   HGB 10.2* 11.6*  HCT 31.5* 34.0*  MCV 100.6*  --   PLT 89*  --    Cardiac Enzymes: No results for input(s): CKTOTAL, CKMB, CKMBINDEX, TROPONINI in the last 168 hours. CBG:  Recent Labs Lab 06/20/15 2128  GLUCAP 126*    Iron Studies: No results for input(s): IRON, TIBC, TRANSFERRIN, FERRITIN in the last 72 hours. Studies/Results: Ct Head Wo Contrast  06/20/2015  CLINICAL DATA:  General body weakness, unable to get out of bed this morning. EXAM: CT HEAD WITHOUT CONTRAST TECHNIQUE: Contiguous  axial images were obtained from the base of the skull through the vertex without intravenous contrast. COMPARISON:  05/21/2015 FINDINGS: Isodense to hypodense small left subdural hematoma, 5 mm in thickness. This is stable size when compared to prior study with evolutionary changes. No new hemorrhage. There is atrophy and chronic small vessel disease changes. No acute infarct. No hydrocephalus. No acute calvarial abnormality. IMPRESSION: Stable size of the small left subdural hematoma with evolutionary changes. Atrophy, chronic small vessel disease. Electronically Signed   By: Charlett Nose M.D.   On: 06/20/2015 09:07   Dg Hip Unilat With Pelvis 2-3 Views Left  06/20/2015  CLINICAL DATA:  Chronic left hip pain. Unable to get out of bed or bear weight. EXAM: DG HIP (WITH OR WITHOUT PELVIS) 2-3V LEFT  COMPARISON:  None. FINDINGS: Degenerative changes are noted in the lower lumbar spine. The hips are located bilaterally. No acute fracture is present. Extensive atherosclerotic changes are present. IMPRESSION: 1. Degenerative changes in the left hip. No acute abnormality. The patient is unable bear weight, CT or preferably MRI could be used for further evaluation of occult fracture. 2. Atherosclerosis. Electronically Signed   By: Marin Roberts M.D.   On: 06/20/2015 10:57   Medications: Infusions:    Scheduled Medications: . amLODipine  10 mg Oral Daily  . [START ON 06/23/2015] calcitRIOL  1 mcg Oral Q M,W,F-HD  . calcium acetate  2,001 mg Oral TID WC  . [START ON 06/26/2015] darbepoetin (ARANESP) injection - DIALYSIS  60 mcg Intravenous Q Thu-HD  . folic acid  1 mg Oral Daily  . gabapentin  100 mg Oral Daily  . insulin aspart  0-5 Units Subcutaneous QHS  . insulin aspart  0-9 Units Subcutaneous TID WC  . lacosamide  100 mg Oral BID  . loratadine  10 mg Oral Daily  . multivitamin  1 tablet Oral QHS  . sodium chloride  3 mL Intravenous Q12H    have reviewed scheduled and prn medications.  Physical Exam: General: less alert, mumbling- not really responding to direct question Heart: RRR Lungs: mostly clear Abdomen: soft, non tender Extremities: no peripheral edema Dialysis Access: right upper AVF pos thrill and bruit    06/21/2015,8:39 AM

## 2015-06-21 NOTE — Consult Note (Signed)
Reason for Consult:Altered mental status Referring Physician: Danford  CC: Altered mental status  HPI: [name] is an 81 y.o. male with a history of dementia and seizures who due to his mental status is unable to provide any history.  Family is not in the room.  All history is obtained from the chart.  The patient had a fall on September 12 and was admitted to [facility] with a SDH.  No operative intervention was required. During the hospitalization, the patient had word finding difficulties after a dialysis session and was evaluated by neurology.  EEG at that time was not consistent with seizure activity and patient had his Vimpat increased from 50-100 mg twice daily. After that hospitalization the patient was at [facility] 1 week for rehabilitation, and then has been home for the last 3 weeks doing PT/OT at home.  This morning coming out of the bathroom, the patient fell again. He was brought to the ER where he was bradycardic in the 40s with initially a sinus rhythm and then a junctional bradycardia. His potassium was 5.6 and so he was taken for dialysis. There was no noted confusion per wife prior to admission.      Past Medical History  Diagnosis Date  . Hypertension   . Diabetes mellitus without complication (HCC)   . Arthritis   . History of blood transfusion   . GI bleed   . ESRD (end stage renal disease) (HCC)   . Seizure (HCC)   . Dialysis patient [facility])     Past Surgical History  Procedure Laterality Date  . Esophagogastroduodenoscopy  10/03/2012    Procedure: ESOPHAGOGASTRODUODENOSCOPY (EGD);  Surgeon: Theda Belfast, MD;  Location: [facility] ENDOSCOPY;  Service: Endoscopy;  Laterality: N/A;  . Colonoscopy  10/04/2012    Procedure: COLONOSCOPY;  Surgeon: Theda Belfast, MD;  Location: [facility] ENDOSCOPY;  Service: Endoscopy;  Laterality: N/A;  . Colonoscopy with esophagogastroduodenoscopy (egd) Left 11/30/2012    Procedure: COLONOSCOPY WITH ESOPHAGOGASTRODUODENOSCOPY (EGD);  Surgeon: Theda Belfast, MD;  Location: [facility] ENDOSCOPY;  Service: Endoscopy;  Laterality: Left;  . Spine surgery  2004    back fusion/ [facility] Penumalli,MD  . Retinal laser procedure Right 04/2015    Family History  Problem Relation Age of Onset  . Sudden death Mother   . Sudden death Father     Social History:  reports that he has never smoked. He has never used smokeless tobacco. He reports that he does not drink alcohol or use illicit drugs.  Allergies  Allergen Reactions  . Aspirin Other (See Comments)    Bleeding   . Lactose Intolerance (Gi) Diarrhea    Medications:  I have reviewed the patient's current medications. Prior to Admission:  Prescriptions prior to admission  Medication Sig Dispense Refill Last Dose  . amLODipine (NORVASC) 10 MG tablet Take 10 mg by mouth Daily.    06/19/2015 at Unknown time  . BESIVANCE 0.6 % SUSP Place 1 drop into the right eye 4 (four) times daily as needed. For 2 days after each monthly injection  1 Past Month at Unknown time  . calcitRIOL (ROCALTROL) 0.5 MCG capsule Take 2 capsules (1 mcg total) by mouth every Monday, Wednesday, and Friday with hemodialysis.   06/19/2015 at Unknown time  . calcium acetate (PHOSLO) 667 MG capsule Take 2,001 mg by mouth 3 (three) times daily.   0 06/19/2015 at Unknown time  . colchicine 0.6 MG tablet as needed.    Past Month at Unknown time  .  cyclobenzaprine (FLEXERIL) 5 MG tablet Take 5 mg by mouth daily as needed.  0 06/19/2015 at Unknown time  . darbepoetin (ARANESP) 60 MCG/0.3ML SOLN injection Inject 0.3 mLs (60 mcg total) into the vein every Thursday with hemodialysis. (Patient taking differently: Inject 60 mcg into the vein every 7 (seven) days. ) 4.2 mL  06/18/2015 at Unknown time  . fexofenadine (ALLEGRA) 180 MG tablet Take 180 mg by mouth daily. As needed   Past Month at Unknown time  . folic acid (FOLVITE) 1 MG tablet Take 1 tablet (1 mg total) by mouth daily. 30 tablet 0 06/19/2015 at Unknown time  . gabapentin (NEURONTIN)  100 MG capsule Take 1 capsule (100 mg total) by mouth daily. 30 capsule 0 06/19/2015 at Unknown time  . HUMALOG KWIKPEN 100 UNIT/ML KiwkPen Sliding Scale  0 06/19/2015 at Unknown time  . multivitamin (RENA-VIT) TABS tablet Take 1 tablet by mouth at bedtime. 30 tablet 0 06/19/2015 at Unknown time  . senna-docusate (SENOKOT-S) 8.6-50 MG per tablet Take 1 tablet by mouth. As needed   06/19/2015 at Unknown time  . triamcinolone (NASACORT ALLERGY 24HR) 55 MCG/ACT AERO nasal inhaler Place 1 spray into the nose. One spray nasal as needed for allery   Past Week at Unknown time  . ACCU-CHEK AVIVA PLUS test strip Check blood sugar three times daily   Taking  . lacosamide 100 MG TABS Take 1 tablet (100 mg total) by mouth 2 (two) times daily. 60 tablet  06/18/2015  . sodium chloride 0.9 % SOLN 100 mL with ferric gluconate 12.5 MG/ML SOLN 62.5 mg Inject 62.5 mg into the vein every Thursday with hemodialysis.   06/18/2015   Scheduled: . amLODipine  10 mg Oral Daily  . [START ON 06/23/2015] calcitRIOL  1 mcg Oral Q M,W,F-HD  . calcium acetate  2,001 mg Oral TID WC  . [START ON 06/26/2015] darbepoetin (ARANESP) injection - DIALYSIS  60 mcg Intravenous Q Thu-HD  . folic acid  1 mg Oral Daily  . gabapentin  100 mg Oral Daily  . insulin aspart  0-5 Units Subcutaneous QHS  . insulin aspart  0-9 Units Subcutaneous TID WC  . lacosamide  100 mg Oral BID  . loratadine  10 mg Oral Daily  . multivitamin  1 tablet Oral QHS  . sodium chloride  3 mL Intravenous Q12H    ROS: Unable to obtain duet to mental status  Physical Examination: Blood pressure 152/67, pulse 79, temperature 98 F (36.7 C), temperature source Oral, resp. rate 16, height 6\' 1"  (1.854 m), weight 92.216 kg (203 lb 4.8 oz), SpO2 99 %.  HEENT-  Normocephalic, no lesions, without obvious abnormality.  Normal external eye and conjunctiva.  Normal TM's bilaterally.  Normal auditory canals and external ears. Normal external nose, mucus membranes and  septum.  Normal pharynx. Cardiovascular- S1, S2 normal, pulses palpable throughout   Lungs- chest clear, no wheezing, rales, normal symmetric air entry Abdomen- soft, non-tender; bowel sounds normal; no masses,  no organomegaly Extremities- no edema Lymph-no adenopathy palpable Musculoskeletal-no joint tenderness, deformity or swelling Skin-warm and dry, no hyperpigmentation, vitiligo, or suspicious lesions  Neurological Examination Mental Status: Alert.  Oriented to name.  Unable to tell me the year.  When asked how old he was he reported 29.  Follows some simple commands but not reproducibly and not cooperative for the most part.  Speech minimal but fluent. Cranial Nerves: II: Would no cooperate for discs to be visualized.  Blinks to bilateral confrontation.  III,IV, VI: ptosis not present, extra-ocular motions intact bilaterally V,VII: smile symmetric VIII: hearing normal bilaterally IX,X: gag reflex present XI: bilateral shoulder shrug XII: midline tongue extension Motor: Moves all extremities equally although not to command.  Moves all against gravity Sensory: Responds to light stimuli throughout Deep Tendon Reflexes: 1+ in the upper extremities, absent in the lower extremities Plantars: Right: mute   Left: mute Cerebellar: Unable to perform due to mental status Gait: not tested due to safety concerns   Laboratory Studies:   Basic Metabolic Panel:  Recent Labs Lab 06/20/15 0810 06/20/15 1630 06/20/15 2318  NA 136 133* 135  K 5.6* 5.0 4.4  CL 102 98* 96*  CO2  --  24 27  GLUCOSE 152* 228* 147*  BUN 83* 60* 38*  CREATININE 9.20* 7.27* 5.89*  CALCIUM  --  8.6* 9.0  MG  --   --  2.5*  PHOS  --  2.6  --     Liver Function Tests:  Recent Labs Lab 06/20/15 1630 06/20/15 2318  AST  --  19  ALT  --  15*  ALKPHOS  --  43  BILITOT  --  0.7  PROT  --  7.1  ALBUMIN 3.7 4.1   No results for input(s): LIPASE, AMYLASE in the last 168 hours. No results for  input(s): AMMONIA in the last 168 hours.  CBC:  Recent Labs Lab 06/20/15 0809 06/20/15 0810  WBC 3.8*  --   NEUTROABS 2.2  --   HGB 10.2* 11.6*  HCT 31.5* 34.0*  MCV 100.6*  --   PLT 89*  --     Cardiac Enzymes: No results for input(s): CKTOTAL, CKMB, CKMBINDEX, TROPONINI in the last 168 hours.  BNP: Invalid input(s): POCBNP  CBG:  Recent Labs Lab 06/20/15 2128  GLUCAP 126*    Microbiology: Results for orders placed or performed during the hospital encounter of 05/19/15  MRSA PCR Screening     Status: None   Collection Time: 05/19/15  6:12 PM  Result Value Ref Range Status   MRSA by PCR NEGATIVE NEGATIVE Final    Comment:        The GeneXpert MRSA Assay (FDA approved for NASAL specimens only), is one component of a comprehensive MRSA colonization surveillance program. It is not intended to diagnose MRSA infection nor to guide or monitor treatment for MRSA infections.     Coagulation Studies: No results for input(s): LABPROT, INR in the last 72 hours.  Urinalysis:  Recent Labs Lab 06/20/15 0845  COLORURINE YELLOW  LABSPEC 1.011  PHURINE 8.0  GLUCOSEU 100*  HGBUR TRACE*  BILIRUBINUR NEGATIVE  KETONESUR NEGATIVE  PROTEINUR 100*  UROBILINOGEN 0.2  NITRITE NEGATIVE  LEUKOCYTESUR NEGATIVE    Lipid Panel:     Component Value Date/Time   CHOL 111 05/20/2015 0313   CHOL 107 05/13/2015 0925   TRIG 63 05/20/2015 0313   HDL 53 05/20/2015 0313   HDL 66 05/13/2015 0925   CHOLHDL 2.1 05/20/2015 0313   CHOLHDL 1.6 05/13/2015 0925   VLDL 13 05/20/2015 0313   LDLCALC 45 05/20/2015 0313   LDLCALC 31 05/13/2015 0925    HgbA1C:  Lab Results  Component Value Date   HGBA1C 5.0 05/13/2015    Urine Drug Screen:  No results found for: LABOPIA, COCAINSCRNUR, LABBENZ, AMPHETMU, THCU, LABBARB  Alcohol Level: No results for input(s): ETH in the last 168 hours.  Other results: EKG: sinus rhythm at 77 bpm.  1st degree AV block.  Imaging:  Ct Head Wo  Contrast  06/20/2015  CLINICAL DATA:  General body weakness, unable to get out of bed this morning. EXAM: CT HEAD WITHOUT CONTRAST TECHNIQUE: Contiguous axial images were obtained from the base of the skull through the vertex without intravenous contrast. COMPARISON:  05/21/2015 FINDINGS: Isodense to hypodense small left subdural hematoma, 5 mm in thickness. This is stable size when compared to prior study with evolutionary changes. No new hemorrhage. There is atrophy and chronic small vessel disease changes. No acute infarct. No hydrocephalus. No acute calvarial abnormality. IMPRESSION: Stable size of the small left subdural hematoma with evolutionary changes. Atrophy, chronic small vessel disease. Electronically Signed   By: Charlett Nose M.D.   On: 06/20/2015 09:07   Dg Hip Unilat With Pelvis 2-3 Views Left  06/20/2015  CLINICAL DATA:  Chronic left hip pain. Unable to get out of bed or bear weight. EXAM: DG HIP (WITH OR WITHOUT PELVIS) 2-3V LEFT COMPARISON:  None. FINDINGS: Degenerative changes are noted in the lower lumbar spine. The hips are located bilaterally. No acute fracture is present. Extensive atherosclerotic changes are present. IMPRESSION: 1. Degenerative changes in the left hip. No acute abnormality. The patient is unable bear weight, CT or preferably MRI could be used for further evaluation of occult fracture. 2. Atherosclerosis. Electronically Signed   By: Marin Roberts M.D.   On: 06/20/2015 10:57     Assessment/Plan: 81 year old male with a history of dementia, seizure and SDH who has developed mental status changes.  There have been no recent changes in medications.  Blood work shows no significant metabolic abnormalities as at this time.  Head CT personally reviewed and shows no worsening of his SDH's.  Concern is for recurrent seizure.    Recommendations: 1.  EEG 2.  Vimpat  IV now.  Continue maintenance at  BID until review of EEG at which time further  recommendations for AED therapy to be made.    Thana Farr, MD [facility] [phone] 06/21/2015, 1:54 AM

## 2015-06-21 NOTE — Progress Notes (Signed)
PROGRESS NOTE  [name] ZOX:[id number] DOB: [birth year] DOA: 06/20/2015 PCP: [name], DO  Brief History 81 y.o. male with a past medical history significant for ESRD on HD MWF, HTN, DM w/ neuropathy, and recent SDH who presents with fall.  The patient is a poor historian. History was supplemented by the patient's wife in the medical record. Apparently, the patient was coming out of the bathroom and suffered a mechanical fall without syncope. The patient was brought to the emergency department where he was noted to have bradycardia in the 40s. Initially the patient was in sinus rhythm, but then developed junctional bradycardia. He was noted to have a potassium of 5.6, and nephrology was consulted. The patient was taken to dialysis on the evening of 06/20/2015.  Since dialysis, the patient has not been bradycardic, and telemetry did not reveal any concerning dysrhythmias. Notably, the patient was recently discharged from the hospital on 05/23/2015.  During that admission, the patient apparently had a mechanical fall resulting in left temporoparietal subdural hematoma as well as left parafalcine subdural hematoma. He was evaluated by neurosurgery who did not feel the patient needed any surgical intervention. During that admission, the patient developed altered mental status and word finding difficulties after dialysis session. Neurology was consulted and MRI was obtained which was negative for any acute ischemic event. Although the MRI initially mapped suggested a possible ischemic event, further clarification revealed that DWI changes were consistent with blood products.   It was felt that his symptoms were related to the subdural hematoma inducing seizure activity. The patient's Vimpat was increased to 100 mg bid  Assessment/Plan: Junctional bradycardia -Likely related to the patient's hyperkalemia -Improved without any recurrence after dialysis -The patient frequently takes himself  off of dialysis related only after 2 to 2 /12 hours in the outpatient center -consult was placed to cardiology -remain on tele Acute encephalopathy -No focal neurologic deficit -This is likely related to the patient's subdural hematoma -06/20/2015 CT brain revealed stable left subdural hematoma  -suspect patient also has a degree of mild cognitive impairment with sundowning -06/21/2015 EEG negative for epileptiform activity  -There was mild to moderate generalized slowing suggestive of encephalopathy -Continue home dose of Vimpat -repeat MRI brain ordered -B12 -TSH -ammonia SDH -stable by CT Frequent falls/gait instability -predates SDH but exacerbated by SDH -PT/OT -The patient's polyneuropathy likely also contributes ESRD -appreciate renal followup Diabetes mellitus type 2 -05/13/2015 hemoglobin A1c 5.0 -SSI -allow for liberal glycemic control Pancytopenia -This is chronic dating back to March 2013 -monitor with serial labs Hypertension -Stable on amlodipine  Family Communication:   Pt at beside Disposition Plan:   Home when medically stable     Procedures/Studies: Ct Head Wo Contrast  06/20/2015  CLINICAL DATA:  General body weakness, unable to get out of bed this morning. EXAM: CT HEAD WITHOUT CONTRAST TECHNIQUE: Contiguous axial images were obtained from the base of the skull through the vertex without intravenous contrast. COMPARISON:  05/21/2015 FINDINGS: Isodense to hypodense small left subdural hematoma, 5 mm in thickness. This is stable size when compared to prior study with evolutionary changes. No new hemorrhage. There is atrophy and chronic small vessel disease changes. No acute infarct. No hydrocephalus. No acute calvarial abnormality. IMPRESSION: Stable size of the small left subdural hematoma with evolutionary changes. Atrophy, chronic small vessel disease. Electronically Signed   By: Charlett Nose M.D.   On: 06/20/2015 09:07   Dg Hip Unilat With Pelvis  2-3  Views Left  06/20/2015  CLINICAL DATA:  Chronic left hip pain. Unable to get out of bed or bear weight. EXAM: DG HIP (WITH OR WITHOUT PELVIS) 2-3V LEFT COMPARISON:  None. FINDINGS: Degenerative changes are noted in the lower lumbar spine. The hips are located bilaterally. No acute fracture is present. Extensive atherosclerotic changes are present. IMPRESSION: 1. Degenerative changes in the left hip. No acute abnormality. The patient is unable bear weight, CT or preferably MRI could be used for further evaluation of occult fracture. 2. Atherosclerosis. Electronically Signed   By: Marin Roberts M.D.   On: 06/20/2015 10:57        Subjective: Patient denies fevers, chills, headache, chest pain, dyspnea, nausea, vomiting, diarrhea, abdominal pain, dysuria, hematuria.   Objective: Filed Vitals:   06/20/15 2011 06/20/15 2111 06/20/15 2359 06/21/15 0443  BP: 137/70 159/63 152/67 129/65  Pulse: 77 78 79 71  Temp: 98.1 F (36.7 C) 97.4 F (36.3 C) 98 F (36.7 C)   TempSrc: Oral Oral Oral   Resp: 15 16 16 16   Height:  6\' 1"  (1.854 m)    Weight:  92.216 kg (203 lb 4.8 oz)  86.955 kg (191 lb 11.2 oz)  SpO2:  100% 99%     Intake/Output Summary (Last 24 hours) at 06/21/15 1344 Last data filed at 06/21/15 0643  Gross per 24 hour  Intake    120 ml  Output   2201 ml  Net  -2081 ml   Weight change:  Exam:   General:  Pt is alert, follows commands appropriately, not in acute distress  HEENT: No icterus, No thrush, No neck mass, [place]/AT  Cardiovascular: RRR, S1/S2, no rubs, no gallops  Respiratory: CTA bilaterally, no wheezing, no crackles, no rhonchi  Abdomen: Soft/+BS, non tender, non distended, no guarding; no hepatosplenomegaly  Extremities: No edema, No lymphangitis, No petechiae, No rashes, no synovitis; no cyanosis or clubbing  Data Reviewed: Basic Metabolic Panel:  Recent Labs Lab 06/20/15 0810 06/20/15 1630 06/20/15 2318  NA 136 133* 135  K 5.6* 5.0 4.4  CL 102  98* 96*  CO2  --  24 27  GLUCOSE 152* 228* 147*  BUN 83* 60* 38*  CREATININE 9.20* 7.27* 5.89*  CALCIUM  --  8.6* 9.0  MG  --   --  2.5*  PHOS  --  2.6  --    Liver Function Tests:  Recent Labs Lab 06/20/15 1630 06/20/15 2318  AST  --  19  ALT  --  15*  ALKPHOS  --  43  BILITOT  --  0.7  PROT  --  7.1  ALBUMIN 3.7 4.1   No results for input(s): LIPASE, AMYLASE in the last 168 hours. No results for input(s): AMMONIA in the last 168 hours. CBC:  Recent Labs Lab 06/20/15 0809 06/20/15 0810  WBC 3.8*  --   NEUTROABS 2.2  --   HGB 10.2* 11.6*  HCT 31.5* 34.0*  MCV 100.6*  --   PLT 89*  --    Cardiac Enzymes: No results for input(s): CKTOTAL, CKMB, CKMBINDEX, TROPONINI in the last 168 hours. BNP: Invalid input(s): POCBNP CBG:  Recent Labs Lab 06/20/15 2128 06/21/15 1116  GLUCAP 126* 119*    No results found for this or any previous visit (from the past 240 hour(s)).   Scheduled Meds: . amLODipine  10 mg Oral Daily  . [START ON 06/23/2015] calcitRIOL  1 mcg Oral Q M,W,F-HD  . calcium acetate  2,001 mg  Oral TID WC  . cinacalcet  60 mg Oral Q supper  . [START ON 06/26/2015] darbepoetin (ARANESP) injection - DIALYSIS  60 mcg Intravenous Q Thu-HD  . folic acid  1 mg Oral Daily  . gabapentin  100 mg Oral Daily  . insulin aspart  0-5 Units Subcutaneous QHS  . insulin aspart  0-9 Units Subcutaneous TID WC  . lacosamide  100 mg Oral BID  . loratadine  10 mg Oral Daily  . multivitamin  1 tablet Oral QHS  . sodium chloride  3 mL Intravenous Q12H   Continuous Infusions:    Ayana Imhof, DO  [facility] Pager [phone]  If 7PM-7AM, please contact night-coverage [email or web address] Password [email or web address] 06/21/2015, 1:44 PM

## 2015-06-21 NOTE — Procedures (Signed)
ELECTROENCEPHALOGRAM REPORT   Patient: [name]        Age: 81 y.o.        Sex: male Referring Physician: Dr Tat Report Date:  06/21/2015        Interpreting Physician: Omelia Blackwater  History: [name] is an 81 y.o. male with AMS  Medications:  Scheduled: . amLODipine  10 mg Oral Daily  . [START ON 06/23/2015] calcitRIOL  1 mcg Oral Q M,W,F-HD  . calcium acetate  2,001 mg Oral TID WC  . cinacalcet  60 mg Oral Q supper  . [START ON 06/26/2015] darbepoetin (ARANESP) injection - DIALYSIS  60 mcg Intravenous Q Thu-HD  . folic acid  1 mg Oral Daily  . gabapentin  100 mg Oral Daily  . insulin aspart  0-5 Units Subcutaneous QHS  . insulin aspart  0-9 Units Subcutaneous TID WC  . lacosamide  100 mg Oral BID  . loratadine  10 mg Oral Daily  . multivitamin  1 tablet Oral QHS  . sodium chloride  3 mL Intravenous Q12H    Conditions of Recording:  This is a 16 channel EEG carried out with the patient in the drowsy state.  Description:  The waking background activity consists of a low voltage, symmetrical, poorly organized, mix of theta and delta activit, seen from the parieto-occipital and posterior temporal regions. No focal slowing or epileptiform activity noted.   Hyperventilation and intermittent photic stimulation not performed.    IMPRESSION: Abnormal EEG due to the presence of generalized irregular slowing indicating a mild to moderate cerebral disturbance (encephalopathy). No focal epileptiform activity noted.    Elspeth Cho, DO [facility] [phone]  If 7pm- 7am, please page neurology on call as listed in AMION. 06/21/2015, 1:07 PM

## 2015-06-21 NOTE — Progress Notes (Signed)
Patient seen and evaluated. Please refer to full neurology consult from colleague, Dr Thad Ranger. Patient is currently resting comfortably. Oriented to name only. Following simple commands. Exam otherwise non-focal. Concern for possible seizure with prolonged post-ictal state.   Plan: 1)EEG 2)Vimpat 100mg  BID 3)Check ammonia, TSH, B12 4)Will continue to follow  , DO [facility] [phone]  If 7pm- 7am, please page neurology on call as listed in AMION.

## 2015-06-22 ENCOUNTER — Encounter (HOSPITAL_COMMUNITY): Payer: Self-pay | Admitting: *Deleted

## 2015-06-22 DIAGNOSIS — I6200 Nontraumatic subdural hemorrhage, unspecified: Secondary | ICD-10-CM | POA: Diagnosis not present

## 2015-06-22 DIAGNOSIS — R627 Adult failure to thrive: Secondary | ICD-10-CM

## 2015-06-22 DIAGNOSIS — R001 Bradycardia, unspecified: Secondary | ICD-10-CM | POA: Diagnosis not present

## 2015-06-22 DIAGNOSIS — N186 End stage renal disease: Secondary | ICD-10-CM | POA: Diagnosis not present

## 2015-06-22 DIAGNOSIS — Z992 Dependence on renal dialysis: Secondary | ICD-10-CM | POA: Diagnosis not present

## 2015-06-22 DIAGNOSIS — G9340 Encephalopathy, unspecified: Secondary | ICD-10-CM

## 2015-06-22 LAB — BASIC METABOLIC PANEL
Anion gap: 12 (ref 5–15)
BUN: 54 mg/dL — AB (ref 6–20)
CALCIUM: 9.3 mg/dL (ref 8.9–10.3)
CO2: 28 mmol/L (ref 22–32)
CREATININE: 8.22 mg/dL — AB (ref 0.61–1.24)
Chloride: 99 mmol/L — ABNORMAL LOW (ref 101–111)
GFR calc Af Amer: 6 mL/min — ABNORMAL LOW (ref >60)
GFR, EST NON AFRICAN AMERICAN: 5 mL/min — AB (ref >60)
GLUCOSE: 149 mg/dL — AB (ref 65–99)
Potassium: 4.6 mmol/L (ref 3.5–5.1)
Sodium: 139 mmol/L (ref 135–145)

## 2015-06-22 LAB — GLUCOSE, CAPILLARY
GLUCOSE-CAPILLARY: 170 mg/dL — AB (ref 65–99)
GLUCOSE-CAPILLARY: 168 mg/dL — AB (ref 65–99)
Glucose-Capillary: 130 mg/dL — ABNORMAL HIGH (ref 65–99)
Glucose-Capillary: 144 mg/dL — ABNORMAL HIGH (ref 65–99)

## 2015-06-22 MED ORDER — CINACALCET HCL 30 MG PO TABS: 60.0000 mg | ORAL_TABLET | Freq: Every day | ORAL | Status: DC

## 2015-06-22 NOTE — Consult Note (Signed)
SUBJECTIVE: The patient is doing well today.  At this time, he denies chest pain, shortness of breath, or any new concerns.  Slept well overnight without complaint.  Marland Kitchen amLODipine  10 mg Oral Daily  . [START ON 06/23/2015] calcitRIOL  1 mcg Oral Q M,W,F-HD  . calcium acetate  2,001 mg Oral TID WC  . cinacalcet  60 mg Oral Q supper  . [START ON 06/26/2015] darbepoetin (ARANESP) injection - DIALYSIS  60 mcg Intravenous Q Thu-HD  . folic acid  1 mg Oral Daily  . gabapentin  100 mg Oral Daily  . insulin aspart  0-5 Units Subcutaneous QHS  . insulin aspart  0-9 Units Subcutaneous TID WC  . lacosamide  100 mg Oral BID  . loratadine  10 mg Oral Daily  . multivitamin  1 tablet Oral QHS  . sodium chloride  3 mL Intravenous Q12H      OBJECTIVE: Physical Exam: Filed Vitals:   06/21/15 1941 06/22/15 0011 06/22/15 0343 06/22/15 0749  BP: 155/60 137/59 142/56 155/65  Pulse: 81 76 79 79  Temp: 97.5 F (36.4 C) 98.2 F (36.8 C) 99 F (37.2 C) 98 F (36.7 C)  TempSrc: Oral Oral Oral Oral  Resp: 16 16 16 16   Height:      Weight:   194 lb 8 oz (88.225 kg)   SpO2: 99% 98% 96% 98%    Intake/Output Summary (Last 24 hours) at 06/22/15 0941 Last data filed at 06/21/15 1855  Gross per 24 hour  Intake    420 ml  Output      0 ml  Net    420 ml    Telemetry reveals sinus rhythm  GEN- The patient is well appearing, alert and oriented x 3 today.   Head- normocephalic, atraumatic Eyes-  Sclera clear, conjunctiva pink Ears- hearing intact Oropharynx- clear Neck- supple, no JVP Lymph- no cervical lymphadenopathy Lungs- Clear to ausculation bilaterally, normal work of breathing Heart- Regular rate and rhythm, no murmurs, rubs or gallops, PMI not laterally displaced GI- soft, NT, ND, + BS Extremities- no clubbing, cyanosis, or edema Skin- no rash or lesion Psych- euthymic mood, full affect Neuro- strength and sensation are intact  LABS: Basic Metabolic Panel:  Recent Labs   06/20/15 1630 06/20/15 2318 06/22/15 0530  NA 133* 135 139  K 5.0 4.4 4.6  CL 98* 96* 99*  CO2 24 27 28   GLUCOSE 228* 147* 149*  BUN 60* 38* 54*  CREATININE 7.27* 5.89* 8.22*  CALCIUM 8.6* 9.0 9.3  MG  --  2.5*  --   PHOS 2.6  --   --    Liver Function Tests:  Recent Labs  06/20/15 1630 06/20/15 2318  AST  --  19  ALT  --  15*  ALKPHOS  --  43  BILITOT  --  0.7  PROT  --  7.1  ALBUMIN 3.7 4.1   No results for input(s): LIPASE, AMYLASE in the last 72 hours. CBC:  Recent Labs  06/20/15 0809 06/20/15 0810  WBC 3.8*  --   NEUTROABS 2.2  --   HGB 10.2* 11.6*  HCT 31.5* 34.0*  MCV 100.6*  --   PLT 89*  --    Cardiac Enzymes: No results for input(s): CKTOTAL, CKMB, CKMBINDEX, TROPONINI in the last 72 hours. BNP: Invalid input(s): POCBNP D-Dimer: No results for input(s): DDIMER in the last 72 hours. Hemoglobin A1C: No results for input(s): HGBA1C in the last 72 hours. Fasting Lipid Panel:  No results for input(s): CHOL, HDL, LDLCALC, TRIG, CHOLHDL, LDLDIRECT in the last 72 hours. Thyroid Function Tests:  Recent Labs  06/21/15 1420  TSH 1.607   Anemia Panel:  Recent Labs  06/21/15 1420  VITAMINB12 1029*    RADIOLOGY: Ct Head Wo Contrast  06/20/2015  CLINICAL DATA:  General body weakness, unable to get out of bed this morning. EXAM: CT HEAD WITHOUT CONTRAST TECHNIQUE: Contiguous axial images were obtained from the base of the skull through the vertex without intravenous contrast. COMPARISON:  05/21/2015 FINDINGS: Isodense to hypodense small left subdural hematoma, 5 mm in thickness. This is stable size when compared to prior study with evolutionary changes. No new hemorrhage. There is atrophy and chronic small vessel disease changes. No acute infarct. No hydrocephalus. No acute calvarial abnormality. IMPRESSION: Stable size of the small left subdural hematoma with evolutionary changes. Atrophy, chronic small vessel disease. Electronically Signed   By: Charlett Nose M.D.   On: 06/20/2015 09:07   Mr Brain Wo Contrast  06/21/2015  CLINICAL DATA:  Altered mental status.  History of subdural hematoma EXAM: MRI HEAD WITHOUT CONTRAST TECHNIQUE: Multiplanar, multiecho pulse sequences of the brain and surrounding structures were obtained without intravenous contrast. COMPARISON:  CT head 06/20/2015, MRI 05/21/2015 FINDINGS: Left hemisphere subdural hematoma slightly smaller now measuring 5 mm in thickness. Typical evolutionary changes with methemoglobin present. Right-sided subdural collection has resolved. No new area of hemorrhage. Generalized atrophy. Negative for hydrocephalus. No significant shift of the midline structures. Negative for acute infarct. Chronic microvascular ischemic change throughout the white matter of a moderate degree. Brainstem intact. Basal ganglia intact. Scattered areas of chronic micro hemorrhage in the left temporoparietal lobe and bilateral occipital lobe. This is most likely related to chronic hypertension. IMPRESSION: Negative for acute infarct. Moderate to advanced chronic microvascular ischemia. Scattered areas of chronic micro hemorrhage bilaterally most consistent with chronic hypertension Subacute to chronic left-sided subdural hematoma slightly smaller now measuring 5 mm. Right-sided subdural collection has resolved in the interval. No new area of hemorrhage. Electronically Signed   By: Marlan Palau M.D.   On: 06/21/2015 16:22   Dg Hip Unilat With Pelvis 2-3 Views Left  06/20/2015  CLINICAL DATA:  Chronic left hip pain. Unable to get out of bed or bear weight. EXAM: DG HIP (WITH OR WITHOUT PELVIS) 2-3V LEFT COMPARISON:  None. FINDINGS: Degenerative changes are noted in the lower lumbar spine. The hips are located bilaterally. No acute fracture is present. Extensive atherosclerotic changes are present. IMPRESSION: 1. Degenerative changes in the left hip. No acute abnormality. The patient is unable bear weight, CT or preferably MRI  could be used for further evaluation of occult fracture. 2. Atherosclerosis. Electronically Signed   By: Marin Roberts M.D.   On: 06/20/2015 10:57    ASSESSMENT AND PLAN:  Principal Problem:   Bradycardia Active Problems:   Hypertension   Diabetes type 2, controlled (HCC)   ESRD on dialysis (HCC)   Secondary hyperparathyroidism, renal (HCC)   Failure to thrive in adult   Seizure disorder (HCC)   SDH (subdural hematoma) x 2   Polyneuropathy (HCC)   Pancytopenia (HCC)   Acute encephalopathy Feeling well.  No further evidence of bradycardia.  Osmar Howton continue to follow.  No indication for pacemaker currently.   Malyah Ohlrich Jorja Loa, MD 06/22/2015 9:41 AM

## 2015-06-22 NOTE — Discharge Summary (Signed)
Physician Discharge Summary  [name] XYV:[id number] DOB: 1931-10-15 DOA: 06/20/2015  PCP: [name], DO  Admit date: 06/20/2015 Discharge date: 06/23/15 Recommendations for Outpatient Follow-up:  1. Pt will need to follow up with PCP in 2 weeks post discharge 2. Please obtain BMP in 1-2 weeks Discharge Diagnoses:  Junctional bradycardia -Likely related to the patient's hyperkalemia? -Improved without any recurrence after dialysis -The patient frequently takes himself off of dialysis early only after 2 to 2 /12 hours in the outpatient center -appreciate cardiology--no indication for PPM -remain on tele--no significant events, no further bradycardia during the admission Acute encephalopathy -No focal neurologic deficit -back to baseline--?post-ictal at admission -This is likely related to the patient's subdural hematoma -06/20/2015 CT brain revealed stable left subdural hematoma  -suspect patient also has a degree of mild cognitive impairment with sundowning -06/21/2015 EEG negative for epileptiform activity  -There was mild to moderate generalized slowing suggestive of encephalopathy -Continue home dose of Vimpat -06/21/15--repeat MRI brain--smaller L-SDH, resolved R-SDH -B12--1029 -TSH--1.607 -ammonia--27 SDH -stable by CT Frequent falls/gait instability -predates SDH but exacerbated by SDH -PT/OT -The patient's polyneuropathy likely also contributes ESRD -appreciate renal followup Diabetes mellitus type 2 -05/13/2015 hemoglobin A1c 5.0 -SSI -allow for liberal glycemic control Pancytopenia -This is chronic dating back to March 2013 -monitor with serial labs Hypertension -Stable on amlodipine  Discharge Condition: stable  Disposition: home  Diet:renal with 1200cc fluid restriction Wt Readings from Last 3 Encounters:  06/22/15 88.225 kg (194 lb 8 oz)  06/09/15 92.806 kg (204 lb 9.6 oz)  05/23/15 91.1 kg (200 lb 13.4 oz)    History of present  illness:   81 y.o. male with a past medical history significant for ESRD on HD MWF, HTN, DM w/ neuropathy, and recent SDH who presents with fall. The patient is a poor historian. History was supplemented by the patient's wife in the medical record. Apparently, the patient was coming out of the bathroom and suffered a mechanical fall without syncope. The patient was brought to the emergency department where he was noted to have bradycardia in the 40s. Initially the patient was in sinus rhythm, but then developed junctional bradycardia. He was noted to have a potassium of 5.6, and nephrology was consulted. The patient was taken to dialysis on the evening of 06/20/2015. Since dialysis, the patient has not been bradycardic, and telemetry did not reveal any concerning dysrhythmias. Notably, the patient was recently discharged from the hospital on 05/23/2015. During that admission, the patient apparently had a mechanical fall resulting in left temporoparietal subdural hematoma as well as left parafalcine subdural hematoma. He was evaluated by neurosurgery who did not feel the patient needed any surgical intervention. During that admission, the patient developed altered mental status and word finding difficulties after dialysis session. Neurology was consulted and MRI was obtained which was negative for any acute ischemic event. Although the MRI initially mapped suggested a possible ischemic event, further clarification revealed that DWI changes were consistent with blood products. It was felt that his symptoms were related to the subdural hematoma inducing seizure activity. The patient's Vimpat was increased to 100 mg bid. During this admission, the patient was initially encephalopathic in the emergency department. Over the next 12-24 hours, the patient's mental status gradually improved. Neurology was consulted and recommended repeat EEG. The EEG showed generalized slowing without epileptiform activity. They  recommended continuing current vimpat dosing. Nephrology was consulted for the patient's dialysis needs. The patient remained afebrile and hemodynamically stable. Repeat MRI of the brain  showed decreasing size of the patient's left subdural hematoma and resolved right subdural hematoma. Consultants: [facility]  Discharge Exam: Filed Vitals:   06/22/15 1953  BP: 162/73  Pulse: 78  Temp: 98 F (36.7 C)  Resp: 18   Filed Vitals:   06/22/15 0343 06/22/15 0749 06/22/15 1200 06/22/15 1953  BP: 142/56 155/65 142/62 162/73  Pulse: 79 79 70 78  Temp: 99 F (37.2 C) 98 F (36.7 C) 98.1 F (36.7 C) 98 F (36.7 C)  TempSrc: Oral Oral Oral Oral  Resp: 16 16 16 18   Height:      Weight: 88.225 kg (194 lb 8 oz)     SpO2: 96% 98% 98% 99%   General: A&O x 3, NAD, pleasant, cooperative Cardiovascular: RRR, no rub, no gallop, no S3 Respiratory: CTAB, no wheeze, no rhonchi Abdomen:soft, nontender, nondistended, positive bowel sounds Extremities: No edema, No lymphangitis, no petechiae  Discharge Instructions      Discharge Instructions    Diet - low sodium heart healthy    Complete by:  As directed      Increase activity slowly    Complete by:  As directed             Medication List    TAKE these medications        ACCU-CHEK AVIVA PLUS test strip  Generic drug:  glucose blood  Check blood sugar three times daily     amLODipine 10 MG tablet  Commonly known as:  NORVASC  Take 10 mg by mouth Daily.     BESIVANCE 0.6 % Susp  Generic drug:  Besifloxacin HCl  Place 1 drop into the right eye 4 (four) times daily as needed. For 2 days after each monthly injection     calcitRIOL 0.5 MCG capsule  Commonly known as:  ROCALTROL  Take 2 capsules (1 mcg total) by mouth every Monday, Wednesday, and Friday with hemodialysis.     calcium acetate 667 MG capsule  Commonly known as:  PHOSLO  Take 2,001 mg by mouth 3 (three) times daily.     cinacalcet 30 MG tablet    Commonly known as:  SENSIPAR  Take 2 tablets (60 mg total) by mouth daily with supper.     colchicine 0.6 MG tablet  as needed.     cyclobenzaprine 5 MG tablet  Commonly known as:  FLEXERIL  Take 5 mg by mouth daily as needed.     darbepoetin 60 MCG/0.3ML Soln injection  Commonly known as:  ARANESP  Inject 0.3 mLs (60 mcg total) into the vein every Thursday with hemodialysis.     fexofenadine 180 MG tablet  Commonly known as:  ALLEGRA  Take 180 mg by mouth daily. As needed     folic acid 1 MG tablet  Commonly known as:  FOLVITE  Take 1 tablet (1 mg total) by mouth daily.     gabapentin 100 MG capsule  Commonly known as:  NEURONTIN  Take 1 capsule (100 mg total) by mouth daily.     HUMALOG KWIKPEN 100 UNIT/ML KiwkPen  Generic drug:  insulin lispro  Sliding Scale     Lacosamide 100 MG Tabs  Take 1 tablet (100 mg total) by mouth 2 (two) times daily.     multivitamin Tabs tablet  Take 1 tablet by mouth at bedtime.     NASACORT ALLERGY 24HR 55 MCG/ACT Aero nasal inhaler  Generic drug:  triamcinolone  Place 1 spray into the nose. One spray nasal  as needed for allery     senna-docusate 8.6-50 MG tablet  Commonly known as:  Senokot-S  Take 1 tablet by mouth. As needed     sodium chloride 0.9 % SOLN 100 mL with ferric gluconate 12.5 MG/ML SOLN 62.5 mg  Inject 62.5 mg into the vein every Thursday with hemodialysis.         The results of significant diagnostics from this hospitalization (including imaging, microbiology, ancillary and laboratory) are listed below for reference.    Significant Diagnostic Studies: Ct Head Wo Contrast  06/20/2015  CLINICAL DATA:  General body weakness, unable to get out of bed this morning. EXAM: CT HEAD WITHOUT CONTRAST TECHNIQUE: Contiguous axial images were obtained from the base of the skull through the vertex without intravenous contrast. COMPARISON:  05/21/2015 FINDINGS: Isodense to hypodense small left subdural hematoma, 5 mm in  thickness. This is stable size when compared to prior study with evolutionary changes. No new hemorrhage. There is atrophy and chronic small vessel disease changes. No acute infarct. No hydrocephalus. No acute calvarial abnormality. IMPRESSION: Stable size of the small left subdural hematoma with evolutionary changes. Atrophy, chronic small vessel disease. Electronically Signed   By: Charlett Nose M.D.   On: 06/20/2015 09:07   Mr Brain Wo Contrast  06/21/2015  CLINICAL DATA:  Altered mental status.  History of subdural hematoma EXAM: MRI HEAD WITHOUT CONTRAST TECHNIQUE: Multiplanar, multiecho pulse sequences of the brain and surrounding structures were obtained without intravenous contrast. COMPARISON:  CT head 06/20/2015, MRI 05/21/2015 FINDINGS: Left hemisphere subdural hematoma slightly smaller now measuring 5 mm in thickness. Typical evolutionary changes with methemoglobin present. Right-sided subdural collection has resolved. No new area of hemorrhage. Generalized atrophy. Negative for hydrocephalus. No significant shift of the midline structures. Negative for acute infarct. Chronic microvascular ischemic change throughout the white matter of a moderate degree. Brainstem intact. Basal ganglia intact. Scattered areas of chronic micro hemorrhage in the left temporoparietal lobe and bilateral occipital lobe. This is most likely related to chronic hypertension. IMPRESSION: Negative for acute infarct. Moderate to advanced chronic microvascular ischemia. Scattered areas of chronic micro hemorrhage bilaterally most consistent with chronic hypertension Subacute to chronic left-sided subdural hematoma slightly smaller now measuring 5 mm. Right-sided subdural collection has resolved in the interval. No new area of hemorrhage. Electronically Signed   By: Marlan Palau M.D.   On: 06/21/2015 16:22   Dg Hip Unilat With Pelvis 2-3 Views Left  06/20/2015  CLINICAL DATA:  Chronic left hip pain. Unable to get out of bed  or bear weight. EXAM: DG HIP (WITH OR WITHOUT PELVIS) 2-3V LEFT COMPARISON:  None. FINDINGS: Degenerative changes are noted in the lower lumbar spine. The hips are located bilaterally. No acute fracture is present. Extensive atherosclerotic changes are present. IMPRESSION: 1. Degenerative changes in the left hip. No acute abnormality. The patient is unable bear weight, CT or preferably MRI could be used for further evaluation of occult fracture. 2. Atherosclerosis. Electronically Signed   By: Marin Roberts M.D.   On: 06/20/2015 10:57     Microbiology: Recent Results (from the past 240 hour(s))  Culture, blood (routine x 2)     Status: None (Preliminary result)   Collection Time: 06/20/15 11:18 PM  Result Value Ref Range Status   Specimen Description BLOOD LEFT ARM  Final   Special Requests   Final    BOTTLES DRAWN AEROBIC AND ANAEROBIC 5CC BLUE AND 3CC RED   Culture NO GROWTH 2 DAYS  Final  Report Status PENDING  Incomplete  Culture, blood (routine x 2)     Status: None (Preliminary result)   Collection Time: 06/20/15 11:28 PM  Result Value Ref Range Status   Specimen Description BLOOD LEFT ANTECUBITAL  Final   Special Requests BOTTLES DRAWN AEROBIC AND ANAEROBIC 3CC   Final   Culture NO GROWTH 2 DAYS  Final   Report Status PENDING  Incomplete     Labs: Basic Metabolic Panel:  Recent Labs Lab 06/20/15 0810 06/20/15 1630 06/20/15 2318 06/22/15 0530  NA 136 133* 135 139  K 5.6* 5.0 4.4 4.6  CL 102 98* 96* 99*  CO2  --  GLUCOSE 152* 228* 147* 149*  BUN 83* 60* 38* 54*  CREATININE 9.20* 7.27* 5.89* 8.22*  CALCIUM  --  8.6* 9.0 9.3  MG  --   --  2.5*  --   PHOS  --  2.6  --   --    Liver Function Tests:  Recent Labs Lab 06/20/15 1630 06/20/15 2318  AST  --  19  ALT  --  15*  ALKPHOS  --  43  BILITOT  --  0.7  PROT  --  7.1  ALBUMIN 3.7 4.1   No results for input(s): LIPASE, AMYLASE in the last 168 hours.  Recent Labs Lab 06/21/15 1420  AMMONIA  27   CBC:  Recent Labs Lab 06/20/15 0809 06/20/15 0810  WBC 3.8*  --   NEUTROABS 2.2  --   HGB 10.2* 11.6*  HCT 31.5* 34.0*  MCV 100.6*  --   PLT 89*  --    Cardiac Enzymes: No results for input(s): CKTOTAL, CKMB, CKMBINDEX, TROPONINI in the last 168 hours. BNP: Invalid input(s): POCBNP CBG:  Recent Labs Lab 06/22/15 0613 06/22/15 1217 06/22/15 1646 06/22/15 2107 06/23/15 0608  GLUCAP 130* 144* 170* 168* 128*    Time coordinating discharge:  Greater than 30 minutes  Signed:  Hajer Dwyer, DO [facility] Pager: [phone] 06/23/2015, 8:13 AM

## 2015-06-22 NOTE — Progress Notes (Signed)
Subjective:  MS improved remarkably overnight- back to baseline- he also says the leg sxms have disappeared  Objective Vital signs in last 24 hours: Filed Vitals:   06/21/15 1941 06/22/15 0011 06/22/15 0343 06/22/15 0749  BP: 155/60 137/59 142/56 155/65  Pulse: 81 76 79 79  Temp: 97.5 F (36.4 C) 98.2 F (36.8 C) 99 F (37.2 C) 98 F (36.7 C)  TempSrc: Oral Oral Oral Oral  Resp: 16 16 16 16   Height:      Weight:   88.225 kg (194 lb 8 oz)   SpO2: 99% 98% 96% 98%   Weight change: -7.031 kg (-15 lb 8 oz)  Intake/Output Summary (Last 24 hours) at 06/22/15 0936 Last data filed at 06/21/15 1855  Gross per 24 hour  Intake    420 ml  Output      0 ml  Net    420 ml   Dialyzes at [facility] Monday, Wednesday and Friday - EDW 91 kg. HD Bath 2K/2.25 calcium, Dialyzer 180, Heparin no. Access AV fistula on the right Mercira 150 given on October 12, calcitriol 1.25 3 times a week. Sensipar 60/PhosLo 4- last hemoglobin 10.1, phosphorus 4.3 PTH 371   Assessment/Plan: 81 year old black male with recent hospitalization for subdural hematoma requiring rehabilitation stay. He now presents with left leg pain/weakness  1 left leg pain/weakness/dec MS - so far head CT is unremarkable as well as left hip x-ray.Now looking for a possible seizure etiology ? Work up per primary and neuro- he will need PT assessment as well - resolved ? etiology 2 ESRD: Normally Monday Wednesday Friday at [facility] via AV fistula. Done Friday, will plan next for Monday - will do first shift and if still OK at that time possible discharge tomorrow 3 Hypertension: Blood pressure reasonable and HR is better on amlodipine- bradycardia likely unrelated 4. Anemia of ESRD: Hemoglobin stable from the outpatient center - received ESA on October 12. Nothing needed right now  5. Metabolic Bone Disease: continue his outpatient calcitriol October 14 3 times a week, PhosLo 3 with meals and Sensipar 60 daily  Sharvil Hoey  A    Labs: Basic Metabolic Panel:  Recent Labs Lab 06/20/15 1630 06/20/15 2318 06/22/15 0530  NA 133* 135 139  K 5.0 4.4 4.6  CL 98* 96* 99*  CO2 24 27 28   GLUCOSE 228* 147* 149*  BUN 60* 38* 54*  CREATININE 7.27* 5.89* 8.22*  CALCIUM 8.6* 9.0 9.3  PHOS 2.6  --   --    Liver Function Tests:  Recent Labs Lab 06/20/15 1630 06/20/15 2318  AST  --  19  ALT  --  15*  ALKPHOS  --  43  BILITOT  --  0.7  PROT  --  7.1  ALBUMIN 3.7 4.1   No results for input(s): LIPASE, AMYLASE in the last 168 hours.  Recent Labs Lab 06/21/15 1420  AMMONIA 27   CBC:  Recent Labs Lab 06/20/15 0809 06/20/15 0810  WBC 3.8*  --   NEUTROABS 2.2  --   HGB 10.2* 11.6*  HCT 31.5* 34.0*  MCV 100.6*  --   PLT 89*  --    Cardiac Enzymes: No results for input(s): CKTOTAL, CKMB, CKMBINDEX, TROPONINI in the last 168 hours. CBG:  Recent Labs Lab 06/20/15 2128 06/21/15 1116 06/21/15 1625 06/21/15 2132 06/22/15 0613  GLUCAP 126* 119* 201* 155* 130*    Iron Studies: No results for input(s): IRON, TIBC, TRANSFERRIN, FERRITIN in the last 72 hours. Studies/Results: Mr Brain  Wo Contrast  06/21/2015  CLINICAL DATA:  Altered mental status.  History of subdural hematoma EXAM: MRI HEAD WITHOUT CONTRAST TECHNIQUE: Multiplanar, multiecho pulse sequences of the brain and surrounding structures were obtained without intravenous contrast. COMPARISON:  CT head 06/20/2015, MRI 05/21/2015 FINDINGS: Left hemisphere subdural hematoma slightly smaller now measuring 5 mm in thickness. Typical evolutionary changes with methemoglobin present. Right-sided subdural collection has resolved. No new area of hemorrhage. Generalized atrophy. Negative for hydrocephalus. No significant shift of the midline structures. Negative for acute infarct. Chronic microvascular ischemic change throughout the white matter of a moderate degree. Brainstem intact. Basal ganglia intact. Scattered areas of chronic micro hemorrhage in  the left temporoparietal lobe and bilateral occipital lobe. This is most likely related to chronic hypertension. IMPRESSION: Negative for acute infarct. Moderate to advanced chronic microvascular ischemia. Scattered areas of chronic micro hemorrhage bilaterally most consistent with chronic hypertension Subacute to chronic left-sided subdural hematoma slightly smaller now measuring 5 mm. Right-sided subdural collection has resolved in the interval. No new area of hemorrhage. Electronically Signed   By: Marlan Palau M.D.   On: 06/21/2015 16:22   Dg Hip Unilat With Pelvis 2-3 Views Left  06/20/2015  CLINICAL DATA:  Chronic left hip pain. Unable to get out of bed or bear weight. EXAM: DG HIP (WITH OR WITHOUT PELVIS) 2-3V LEFT COMPARISON:  None. FINDINGS: Degenerative changes are noted in the lower lumbar spine. The hips are located bilaterally. No acute fracture is present. Extensive atherosclerotic changes are present. IMPRESSION: 1. Degenerative changes in the left hip. No acute abnormality. The patient is unable bear weight, CT or preferably MRI could be used for further evaluation of occult fracture. 2. Atherosclerosis. Electronically Signed   By: Marin Roberts M.D.   On: 06/20/2015 10:57   Medications: Infusions:    Scheduled Medications: . amLODipine  10 mg Oral Daily  . [START ON 06/23/2015] calcitRIOL  1 mcg Oral Q M,W,F-HD  . calcium acetate  2,001 mg Oral TID WC  . cinacalcet  60 mg Oral Q supper  . [START ON 06/26/2015] darbepoetin (ARANESP) injection - DIALYSIS  60 mcg Intravenous Q Thu-HD  . folic acid  1 mg Oral Daily  . gabapentin  100 mg Oral Daily  . insulin aspart  0-5 Units Subcutaneous QHS  . insulin aspart  0-9 Units Subcutaneous TID WC  . lacosamide  100 mg Oral BID  . loratadine  10 mg Oral Daily  . multivitamin  1 tablet Oral QHS  . sodium chloride  3 mL Intravenous Q12H    have reviewed scheduled and prn medications.  Physical Exam: General: back to his  baseline Heart: RRR Lungs: mostly clear Abdomen: soft, non tender Extremities: no peripheral edema Dialysis Access: right upper AVF pos thrill and bruit    06/22/2015,9:36 AM

## 2015-06-22 NOTE — Progress Notes (Signed)
Pt. Ambulated down hall with walker and 2 person assist.  Tolerated well.

## 2015-06-22 NOTE — Progress Notes (Signed)
Pt slept well during the night, no confusion noted or voiced by the sitter.

## 2015-06-22 NOTE — Progress Notes (Signed)
Subjective: Patient had no complaints. Mental status has improved. No overnight adverse events reported.  Objective: Current vital signs: BP 155/65 mmHg  Pulse 79  Temp(Src) 98 F (36.7 C) (Oral)  Resp 16  Ht 6\' 1"  (1.854 m)  Wt 88.225 kg (194 lb 8 oz)  BMI 25.67 kg/m2  SpO2 98%  Neurologic Exam: Patient was alert and in no acute distress. He was well-oriented to time as well as place. He had no difficulty with following simple commands. Extraocular movements were full and conjugate. Face was symmetrical with no focal weakness. Speech was normal. Showed symmetrical strength and movement of extremities as well as normal coordination of upper extremities.  MRI of the brain showed no acute findings. Small vessel ischemic changes were noted. Subacute to chronic left subdural hematoma measured 5 mm in width and was smaller than on recent previous studies.  EEG showed mild to moderate generalized slowing but was otherwise unremarkable, including no signs of an epileptic disorder.  Medications: I have reviewed the patient's current medications.  Assessment/Plan: 81 year old man admitted with altered mental status of unclear etiology. He has a history of dementia as well as seizure and subdural hematoma. Mental status has improved significantly since admission. MRI showed no acute findings and subdural hematoma was smaller compared to recent imaging studies. EEG did not show epileptiform activity and only showed mild to moderate slowing of cerebral activity consistent with central status changes.  Recommend no changes in current treatment, including continuing Vimpat 100 mg twice a day, as patient has experienced at least one known seizure and was possibly postictal on admission, in addition to having a structural and potentially epileptogenic spatial intracranial lesion (subdural hematoma).  We will continue to follow this patient with you.  C.R. 94, MD [facility] [phone]  06/22/2015  9:25 AM

## 2015-06-22 NOTE — Evaluation (Signed)
Physical Therapy Evaluation Patient Details Name: [name] MRN: [id number] DOB: 1932-03-28 Today's Date: 06/22/2015   History of Present Illness  81 y.o. male with a past medical history significant for ESRD on HD MWF, HTN, DM w/ neuropathy, and recent SDH who presents with fall. The patient is a poor historian. History was supplemented by the patient's wife in the medical record. Apparently, the patient was coming out of the bathroom and suffered a mechanical fall without syncope. The patient was brought to the emergency department where he was noted to have bradycardia in the 40s. Initially the patient was in sinus rhythm, but then developed junctional bradycardia. He was noted to have a potassium of 5.6, and nephrology was consulted. The patient was taken to dialysis on the evening of 06/20/2015. Since dialysis, the patient has not been bradycardic, and telemetry did not reveal any concerning dysrhythmias. Notably, the patient was recently discharged from the hospital on 05/23/2015. During that admission, the patient apparently had a mechanical fall resulting in left temporoparietal subdural hematoma as well as left parafalcine subdural hematoma. He was evaluated by neurosurgery who did not feel the patient needed any surgical intervention. During that admission, the patient developed altered mental status and word finding difficulties after dialysis session. Neurology was consulted and MRI was obtained which was negative for any acute ischemic event. Although the MRI initially mapped suggested a possible ischemic event, further clarification revealed that DWI changes were consistent with blood products. It was felt that his symptoms were related to the subdural hematoma inducing seizure activity.  Clinical Impression  Pt admitted with above diagnosis. Pt currently with functional limitations due to the deficits listed below (see PT Problem List).  Pt will benefit from skilled PT to increase  their independence and safety with mobility to allow discharge to the venue listed below.       Follow Up Recommendations Home health PT;Supervision/Assistance - 24 hour;Other (comment) (HHOT, and HHRN for chronic disease management)    Equipment Recommendations  None recommended by PT (pretty well-equipped)    Recommendations for Other Services       Precautions / Restrictions Precautions Precautions: Fall      Mobility  Bed Mobility                  Transfers Overall transfer level: Needs assistance Equipment used: Rolling walker (2 wheeled) Transfers: Sit to/from Stand Sit to Stand: Min guard         General transfer comment: Cues for hand palcement and safety  Ambulation/Gait Ambulation/Gait assistance: Min guard;Min assist Ambulation Distance (Feet): 100 Feet Assistive device: Rolling walker (2 wheeled) Gait Pattern/deviations: Step-through pattern;Trunk flexed (both knees slightly flexed throughout gait cycle) Gait velocity: slow   General Gait Details: Cues to self-monitor for activity tolerance; adjusted RW for better fit  Stairs            Wheelchair Mobility    Modified Rankin (Stroke Patients Only)       Balance Overall balance assessment: Needs assistance           Standing balance-Leahy Scale: Poor                               Pertinent Vitals/Pain Pain Assessment: No/denies pain    Home Living Family/patient expects to be discharged to:: Private residence Living Arrangements: Spouse/significant other Available Help at Discharge: Family;Available 24 hours/day Type of Home: House Home Access: Ramped entrance  Home Layout: Two level Home Equipment: Walker - 2 wheels;Bedside commode (stairlift) Additional Comments: Wife was present to verify information    Prior Function Level of Independence: Needs assistance   Gait / Transfers Assistance Needed: with RW and supervision; recent history of falls            Hand Dominance   Dominant Hand: Right    Extremity/Trunk Assessment   Upper Extremity Assessment: Defer to OT evaluation (noted L elbow swelling similar to bursitis)           Lower Extremity Assessment: Generalized weakness         Communication   Communication: No difficulties;Other (comment) (Noting he tends to repeat himself)  Cognition Arousal/Alertness: Awake/alert Behavior During Therapy: WFL for tasks assessed/performed;Flat affect (occasional smile and laugh at jokes) Overall Cognitive Status: History of cognitive impairments - at baseline Area of Impairment: Memory     Memory: Decreased short-term memory              General Comments      Exercises        Assessment/Plan    PT Assessment Patient needs continued PT services  PT Diagnosis Difficulty walking;Generalized weakness   PT Problem List Decreased strength;Decreased activity tolerance;Decreased balance;Decreased mobility;Decreased coordination;Decreased cognition;Decreased knowledge of use of DME;Decreased knowledge of precautions  PT Treatment Interventions DME instruction;Gait training;Functional mobility training;Therapeutic activities;Therapeutic exercise;Patient/family education   PT Goals (Current goals can be found in the Care Plan section) Acute Rehab PT Goals Patient Stated Goal: wants to go home PT Goal Formulation: With patient Time For Goal Achievement: 07/06/15 Potential to Achieve Goals: Good    Frequency Min 3X/week   Barriers to discharge        Co-evaluation               End of Session Equipment Utilized During Treatment: Gait belt Activity Tolerance: Patient tolerated treatment well Patient left: in chair;with call bell/phone within reach;with chair alarm set Nurse Communication: Mobility status    Functional Assessment Tool Used: Clinical Judgement Functional Limitation: Mobility: Walking and moving around Mobility: Walking and Moving Around  Current Status [phone]): At least 20 percent but less than 40 percent impaired, limited or restricted Mobility: Walking and Moving Around Goal Status [phone]): 0 percent impaired, limited or restricted    Time: 4132-4401 PT Time Calculation (min) (ACUTE ONLY): 27 min   Charges:   PT Evaluation $Initial PT Evaluation Tier I: 1 Procedure PT Treatments $Gait Training: 8-22 mins   PT G Codes:   PT G-Codes **NOT FOR INPATIENT CLASS** Functional Assessment Tool Used: Clinical Judgement Functional Limitation: Mobility: Walking and moving around Mobility: Walking and Moving Around Current Status ([id number]): At least 20 percent but less than 40 percent impaired, limited or restricted Mobility: Walking and Moving Around Goal Status [phone]): 0 percent impaired, limited or restricted    Van Clines [facility] 06/22/2015, 5:49 PM  Van Clines, PT  Acute Rehabilitation Services Pager [phone] Office [phone]

## 2015-06-22 NOTE — Progress Notes (Signed)
PROGRESS NOTE  [name] TMH:[id number] DOB: [birth year] DOA: 06/20/2015 PCP: [name], DO  Brief History 81 y.o. male with a past medical history significant for ESRD on HD MWF, HTN, DM w/ neuropathy, and recent SDH who presents with fall. The patient is a poor historian. History was supplemented by the patient's wife in the medical record. Apparently, the patient was coming out of the bathroom and suffered a mechanical fall without syncope. The patient was brought to the emergency department where he was noted to have bradycardia in the 40s. Initially the patient was in sinus rhythm, but then developed junctional bradycardia. He was noted to have a potassium of 5.6, and nephrology was consulted. The patient was taken to dialysis on the evening of 06/20/2015. Since dialysis, the patient has not been bradycardic, and telemetry did not reveal any concerning dysrhythmias. Notably, the patient was recently discharged from the hospital on 05/23/2015. During that admission, the patient apparently had a mechanical fall resulting in left temporoparietal subdural hematoma as well as left parafalcine subdural hematoma. He was evaluated by neurosurgery who did not feel the patient needed any surgical intervention. During that admission, the patient developed altered mental status and word finding difficulties after dialysis session. Neurology was consulted and MRI was obtained which was negative for any acute ischemic event. Although the MRI initially mapped suggested a possible ischemic event, further clarification revealed that DWI changes were consistent with blood products. It was felt that his symptoms were related to the subdural hematoma inducing seizure activity. The patient's Vimpat was increased to 100 mg bid.  Assessment/Plan: Junctional bradycardia -Likely related to the patient's hyperkalemia? -Improved without any recurrence after dialysis -The patient frequently takes himself  off of dialysis early only after 2 to 2 /12 hours in the outpatient center -appreciate cardiology -remain on tele--no significant events Acute encephalopathy -No focal neurologic deficit -back to baseline--?post-ictal at admission -This is likely related to the patient's subdural hematoma -06/20/2015 CT brain revealed stable left subdural hematoma  -suspect patient also has a degree of mild cognitive impairment with sundowning -06/21/2015 EEG negative for epileptiform activity  -There was mild to moderate generalized slowing suggestive of encephalopathy -Continue home dose of Vimpat -06/21/15--repeat MRI brain--smaller L-SDH, resolved R-SDH -B12--1029 -TSH--1.607 -ammonia--27 SDH -stable by CT Frequent falls/gait instability -predates SDH but exacerbated by SDH -PT/OT -The patient's polyneuropathy likely also contributes ESRD -appreciate renal followup Diabetes mellitus type 2 -05/13/2015 hemoglobin A1c 5.0 -SSI -allow for liberal glycemic control Pancytopenia -This is chronic dating back to March 2013 -monitor with serial labs Hypertension -Stable on amlodipine  Family Communication: Pt at beside Disposition Plan: Home  06/23/15 if stable         Procedures/Studies: Ct Head Wo Contrast  06/20/2015  CLINICAL DATA:  General body weakness, unable to get out of bed this morning. EXAM: CT HEAD WITHOUT CONTRAST TECHNIQUE: Contiguous axial images were obtained from the base of the skull through the vertex without intravenous contrast. COMPARISON:  05/21/2015 FINDINGS: Isodense to hypodense small left subdural hematoma, 5 mm in thickness. This is stable size when compared to prior study with evolutionary changes. No new hemorrhage. There is atrophy and chronic small vessel disease changes. No acute infarct. No hydrocephalus. No acute calvarial abnormality. IMPRESSION: Stable size of the small left subdural hematoma with evolutionary changes. Atrophy, chronic small vessel  disease. Electronically Signed   By: Charlett Nose M.D.   On: 06/20/2015 09:07   Mr Brain  Wo Contrast  06/21/2015  CLINICAL DATA:  Altered mental status.  History of subdural hematoma EXAM: MRI HEAD WITHOUT CONTRAST TECHNIQUE: Multiplanar, multiecho pulse sequences of the brain and surrounding structures were obtained without intravenous contrast. COMPARISON:  CT head 06/20/2015, MRI 05/21/2015 FINDINGS: Left hemisphere subdural hematoma slightly smaller now measuring 5 mm in thickness. Typical evolutionary changes with methemoglobin present. Right-sided subdural collection has resolved. No new area of hemorrhage. Generalized atrophy. Negative for hydrocephalus. No significant shift of the midline structures. Negative for acute infarct. Chronic microvascular ischemic change throughout the white matter of a moderate degree. Brainstem intact. Basal ganglia intact. Scattered areas of chronic micro hemorrhage in the left temporoparietal lobe and bilateral occipital lobe. This is most likely related to chronic hypertension. IMPRESSION: Negative for acute infarct. Moderate to advanced chronic microvascular ischemia. Scattered areas of chronic micro hemorrhage bilaterally most consistent with chronic hypertension Subacute to chronic left-sided subdural hematoma slightly smaller now measuring 5 mm. Right-sided subdural collection has resolved in the interval. No new area of hemorrhage. Electronically Signed   By: Marlan Palau M.D.   On: 06/21/2015 16:22   Dg Hip Unilat With Pelvis 2-3 Views Left  06/20/2015  CLINICAL DATA:  Chronic left hip pain. Unable to get out of bed or bear weight. EXAM: DG HIP (WITH OR WITHOUT PELVIS) 2-3V LEFT COMPARISON:  None. FINDINGS: Degenerative changes are noted in the lower lumbar spine. The hips are located bilaterally. No acute fracture is present. Extensive atherosclerotic changes are present. IMPRESSION: 1. Degenerative changes in the left hip. No acute abnormality. The patient  is unable bear weight, CT or preferably MRI could be used for further evaluation of occult fracture. 2. Atherosclerosis. Electronically Signed   By: Marin Roberts M.D.   On: 06/20/2015 10:57         Subjective:   Objective: Filed Vitals:   06/21/15 1941 06/22/15 0011 06/22/15 0343 06/22/15 0749  BP: 155/60 137/59 142/56 155/65  Pulse: 81 76 79 79  Temp: 97.5 F (36.4 C) 98.2 F (36.8 C) 99 F (37.2 C) 98 F (36.7 C)  TempSrc: Oral Oral Oral Oral  Resp: 16 16 16 16   Height:      Weight:   88.225 kg (194 lb 8 oz)   SpO2: 99% 98% 96% 98%    Intake/Output Summary (Last 24 hours) at 06/22/15 1340 Last data filed at 06/22/15 1150  Gross per 24 hour  Intake    780 ml  Output      0 ml  Net    780 ml   Weight change: -7.031 kg (-15 lb 8 oz) Exam:   General:  Pt is alert, follows commands appropriately, not in acute distress  HEENT: No icterus, No thrush, No neck mass, /AT  Cardiovascular: RRR, S1/S2, no rubs, no gallops  Respiratory: CTA bilaterally, no wheezing, no crackles, no rhonchi  Abdomen: Soft/+BS, non tender, non distended, no guarding  Extremities: No edema, No lymphangitis, No petechiae, No rashes, no synovitis  Data Reviewed: Basic Metabolic Panel:  Recent Labs Lab 06/20/15 0810 06/20/15 1630 06/20/15 2318 06/22/15 0530  NA 136 133* 135 139  K 5.6* 5.0 4.4 4.6  CL 102 98* 96* 99*  CO2  --  24 27 28   GLUCOSE 152* 228* 147* 149*  BUN 83* 60* 38* 54*  CREATININE 9.20* 7.27* 5.89* 8.22*  CALCIUM  --  8.6* 9.0 9.3  MG  --   --  2.5*  --   PHOS  --  2.6  --   --  Liver Function Tests:  Recent Labs Lab 06/20/15 1630 06/20/15 2318  AST  --  19  ALT  --  15*  ALKPHOS  --  43  BILITOT  --  0.7  PROT  --  7.1  ALBUMIN 3.7 4.1   No results for input(s): LIPASE, AMYLASE in the last 168 hours.  Recent Labs Lab 06/21/15 1420  AMMONIA 27   CBC:  Recent Labs Lab 06/20/15 0809 06/20/15 0810  WBC 3.8*  --   NEUTROABS 2.2   --   HGB 10.2* 11.6*  HCT 31.5* 34.0*  MCV 100.6*  --   PLT 89*  --    Cardiac Enzymes: No results for input(s): CKTOTAL, CKMB, CKMBINDEX, TROPONINI in the last 168 hours. BNP: Invalid input(s): POCBNP CBG:  Recent Labs Lab 06/21/15 1116 06/21/15 1625 06/21/15 2132 06/22/15 0613 06/22/15 1217  GLUCAP 119* 201* 155* 130* 144*    Recent Results (from the past 240 hour(s))  Culture, blood (routine x 2)     Status: None (Preliminary result)   Collection Time: 06/20/15 11:18 PM  Result Value Ref Range Status   Specimen Description BLOOD LEFT ARM  Final   Special Requests   Final    BOTTLES DRAWN AEROBIC AND ANAEROBIC 5CC BLUE AND 3CC RED   Culture NO GROWTH < 24 HOURS  Final   Report Status PENDING  Incomplete  Culture, blood (routine x 2)     Status: None (Preliminary result)   Collection Time: 06/20/15 11:28 PM  Result Value Ref Range Status   Specimen Description BLOOD LEFT ANTECUBITAL  Final   Special Requests BOTTLES DRAWN AEROBIC AND ANAEROBIC 3CC EACH  Final   Culture NO GROWTH < 24 HOURS  Final   Report Status PENDING  Incomplete     Scheduled Meds: . amLODipine  10 mg Oral Daily  . [START ON 06/23/2015] calcitRIOL  1 mcg Oral Q M,W,F-HD  . calcium acetate  2,001 mg Oral TID WC  . cinacalcet  60 mg Oral Q supper  . [START ON 06/26/2015] darbepoetin (ARANESP) injection - DIALYSIS  60 mcg Intravenous Q Thu-HD  . folic acid  1 mg Oral Daily  . gabapentin  100 mg Oral Daily  . insulin aspart  0-5 Units Subcutaneous QHS  . insulin aspart  0-9 Units Subcutaneous TID WC  . lacosamide  100 mg Oral BID  . loratadine  10 mg Oral Daily  . multivitamin  1 tablet Oral QHS  . sodium chloride  3 mL Intravenous Q12H   Continuous Infusions:    Eldor Conaway, DO  [facility] Pager [phone]  If 7PM-7AM, please contact night-coverage [email or web address] Password [email or web address] 06/22/2015, 1:40 PM

## 2015-06-23 DIAGNOSIS — N186 End stage renal disease: Secondary | ICD-10-CM | POA: Diagnosis not present

## 2015-06-23 DIAGNOSIS — D61818 Other pancytopenia: Secondary | ICD-10-CM | POA: Diagnosis not present

## 2015-06-23 DIAGNOSIS — G4733 Obstructive sleep apnea (adult) (pediatric): Secondary | ICD-10-CM | POA: Diagnosis not present

## 2015-06-23 DIAGNOSIS — R001 Bradycardia, unspecified: Secondary | ICD-10-CM | POA: Diagnosis not present

## 2015-06-23 DIAGNOSIS — G9340 Encephalopathy, unspecified: Secondary | ICD-10-CM | POA: Diagnosis not present

## 2015-06-23 LAB — RENAL FUNCTION PANEL
ALBUMIN: 3.5 g/dL (ref 3.5–5.0)
ANION GAP: 11 (ref 5–15)
BUN: 70 mg/dL — AB (ref 6–20)
CALCIUM: 9.1 mg/dL (ref 8.9–10.3)
CO2: 24 mmol/L (ref 22–32)
CREATININE: 9.91 mg/dL — AB (ref 0.61–1.24)
Chloride: 99 mmol/L — ABNORMAL LOW (ref 101–111)
GFR calc Af Amer: 5 mL/min — ABNORMAL LOW (ref >60)
GFR calc non Af Amer: 4 mL/min — ABNORMAL LOW (ref >60)
GLUCOSE: 184 mg/dL — AB (ref 65–99)
PHOSPHORUS: 4 mg/dL (ref 2.5–4.6)
Potassium: 5.1 mmol/L (ref 3.5–5.1)
SODIUM: 134 mmol/L — AB (ref 135–145)

## 2015-06-23 LAB — CALCIUM, IONIZED: Calcium, Ionized, Serum: 4.7 mg/dL (ref 4.5–5.6)

## 2015-06-23 LAB — CBC WITH DIFFERENTIAL/PLATELET
BASOS PCT: 1 %
Basophils Absolute: 0 10*3/uL (ref 0.0–0.1)
EOS ABS: 0.2 10*3/uL (ref 0.0–0.7)
Eosinophils Relative: 5 %
HCT: 28 % — ABNORMAL LOW (ref 39.0–52.0)
HEMOGLOBIN: 9.2 g/dL — AB (ref 13.0–17.0)
Lymphocytes Relative: 26 %
Lymphs Abs: 1 10*3/uL (ref 0.7–4.0)
MCH: 32.6 pg (ref 26.0–34.0)
MCHC: 32.9 g/dL (ref 30.0–36.0)
MCV: 99.3 fL (ref 78.0–100.0)
MONOS PCT: 9 %
Monocytes Absolute: 0.3 10*3/uL (ref 0.1–1.0)
NEUTROS PCT: 60 %
Neutro Abs: 2.2 10*3/uL (ref 1.7–7.7)
PLATELETS: 101 10*3/uL — AB (ref 150–400)
RBC: 2.82 MIL/uL — ABNORMAL LOW (ref 4.22–5.81)
RDW: 15.6 % — ABNORMAL HIGH (ref 11.5–15.5)
WBC: 3.8 10*3/uL — AB (ref 4.0–10.5)

## 2015-06-23 LAB — GLUCOSE, CAPILLARY: Glucose-Capillary: 128 mg/dL — ABNORMAL HIGH (ref 65–99)

## 2015-06-23 MED ORDER — CALCITRIOL 0.5 MCG PO CAPS
ORAL_CAPSULE | ORAL | Status: AC
  Administered 2015-06-23: 1 ug via ORAL
  Filled 2015-06-23: qty 2

## 2015-06-23 NOTE — Progress Notes (Signed)
Patient is active with [name] as prior to admission. Mary with [name] called and is aware of discharge for today. Abelino Derrick [facility] [phone]

## 2015-06-23 NOTE — Progress Notes (Signed)
Discharge instructions given. Pt verbalized understanding and all questions were answered.  

## 2015-06-23 NOTE — Progress Notes (Signed)
  [facility] Progress Note   Subjective: alert, no compalints  Filed Vitals:   06/22/15 0343 06/22/15 0749 06/22/15 1200 06/22/15 1953  BP: 142/56 155/65 142/62 162/73  Pulse: 79 79 70 78  Temp: 99 F (37.2 C) 98 F (36.7 C) 98.1 F (36.7 C) 98 F (36.7 C)  TempSrc: Oral Oral Oral Oral  Resp: 16 16 16 18   Height:      Weight: 88.225 kg (194 lb 8 oz)     SpO2: 96% 98% 98% 99%   Exam: Alert, O x 3 No jvd Chest clear bilat RRR no RG ABd soft ntnd no ascites No LE edema RUA AVF +bruit NEuro is alert, Ox 3, nf  MRI brain- no acute infarct. Mod-advanced chronic ischemic change. Areas of chronic micro-bleeds d/t chronic HTN. Old resolving L SDH.  R SDH resolved now L hip xray - chronic DJD, no fx   MWF [facility]  91kg  2/2.25 bath  Heparin none  R AVF Mircera 150 on 10/12, last Hb 10.1 Calcitriol 1.25 tiw, sensipar 60  P 4  pth 371      Assessment: 1 Leg pain/ weakness / AMS - arthritis L hip. Improved.Chronic back pain which limits duration of dialysis.  CT head neg.  2 ESRD :MWF hd 3 HTN on norvasc 4 Anemia on esa, next 10.26 5 MBD vit D/ sensipar/ binder 6 Dispo - stable from renal standpoint  Plan - HD today   12/12 MD [facility] pager [phone]    cell [phone] 06/23/2015, 9:55 AM    Recent Labs Lab 06/20/15 1630 06/20/15 2318 06/22/15 0530  NA 133* 135 139  K 5.0 4.4 4.6  CL 98* 96* 99*  CO2 24 27 28   GLUCOSE 228* 147* 149*  BUN 60* 38* 54*  CREATININE 7.27* 5.89* 8.22*  CALCIUM 8.6* 9.0 9.3  PHOS 2.6  --   --     Recent Labs Lab 06/20/15 1630 06/20/15 2318  AST  --  19  ALT  --  15*  ALKPHOS  --  43  BILITOT  --  0.7  PROT  --  7.1  ALBUMIN 3.7 4.1    Recent Labs Lab 06/20/15 0809 06/20/15 0810  WBC 3.8*  --   NEUTROABS 2.2  --   HGB 10.2* 11.6*  HCT 31.5* 34.0*  MCV 100.6*  --   PLT 89*  --    . amLODipine  10 mg Oral Daily  . calcitRIOL  1 mcg Oral Q M,W,F-HD  . calcium acetate  2,001  mg Oral TID WC  . cinacalcet  60 mg Oral Q supper  . [START ON 06/26/2015] darbepoetin (ARANESP) injection - DIALYSIS  60 mcg Intravenous Q Thu-HD  . folic acid  1 mg Oral Daily  . gabapentin  100 mg Oral Daily  . insulin aspart  0-5 Units Subcutaneous QHS  . insulin aspart  0-9 Units Subcutaneous TID WC  . lacosamide  100 mg Oral BID  . loratadine  10 mg Oral Daily  . multivitamin  1 tablet Oral QHS  . sodium chloride  3 mL Intravenous Q12H     sodium chloride, sodium chloride, alteplase, heparin, lidocaine (PF), lidocaine-prilocaine, pentafluoroprop-tetrafluoroeth, senna-docusate

## 2015-06-24 ENCOUNTER — Ambulatory Visit (INDEPENDENT_AMBULATORY_CARE_PROVIDER_SITE_OTHER): Payer: Medicare Other | Admitting: Diagnostic Neuroimaging

## 2015-06-24 ENCOUNTER — Encounter: Payer: Self-pay | Admitting: Diagnostic Neuroimaging

## 2015-06-24 VITALS — BP 148/70 | HR 66

## 2015-06-24 DIAGNOSIS — G40909 Epilepsy, unspecified, not intractable, without status epilepticus: Secondary | ICD-10-CM | POA: Diagnosis not present

## 2015-06-24 DIAGNOSIS — N186 End stage renal disease: Secondary | ICD-10-CM | POA: Diagnosis not present

## 2015-06-24 DIAGNOSIS — Z992 Dependence on renal dialysis: Secondary | ICD-10-CM

## 2015-06-24 NOTE — Progress Notes (Signed)
PATIENT: [name] DOB: 1931/11/14  REASON FOR VISIT: routine follow up for seizure disorder, memory HISTORY FROM: patient and wife   Chief Complaint  Patient presents with  . Seizures    wife   . Follow-up    from hospital     HISTORY OF PRESENT ILLNESS:  UPDATE 06/24/15: Since last visit, had hospital admission 05/19/15-05/23/15 for fall due to left knee giving out; had subdural hematoma and treated conservatively. Increased vimpat to 100mg  BID due to word finding diff and AMS. Then back to hospital 06/20/15-06/22/15 for inability to get out of bed and stand. Found to have bradycardia. Treated conservatively.   UPDATE 04/17/15: Since last visit, no seizures. Overall stable. Other day at dialysis had diff bleeding, so had to stay in chair and extra hour. Now having more back pain. Using traction device at home.   UPDATE 10/29/14 (VRP): Patient is doing well. No seizures. Memory loss has improved. He continues on hemodialysis and is doing well.  UPDATE 04/22/14 (LL):  Since last visit, patient is much improved. No seizures, left rehab and living again at home with his wife.  Mood much better. Cognition better. Reportedly taking less fluid off at hemodialysis. Tolerating vimpat without side effects.  UPDATE 10/17/13: Since last visit, was admitted 09/25/13 - 09/29/13 for altered mental status,elevated VPA level (107.5). VPA was d/c's and vimpat 100mg  BID was started. Also was admitted 10/01/13 - 10/09/13 for metabolic encephalopathy. This time vimpat was decreased to 50mg  BID. No definite witnessed seizures. Now in rehab, trying to improve strength before returning home.   PRIOR HPI (02/09/13): 81 y.o. male with ESRD, on dialysis x 3 weeks, diabetes, hypertension, recent history of GI bleeding from arteriovenous malformations. On 01/12/13 was in the bathroom at home he sustained a syncopal event. His wife was downstairs, she heard him fall and ran upstairs, she found him on the bathroom floor  wedged between the wall and the commode. He was apparently awake but lethargic and somewhat confused. She then called EMS, upon EMS arrival patient started slowly coming around, he was then brought to the emergency room for further evaluation. While in the emergency room patient completely regained his usual mentation. CT of the head and C-spine was found to be negative, patient sustained a small laceration above his left eye. Son reports that patient had second witnessed seizure in the hospital, where he had convulsions and LOC. He was incontinent of urine without tongue-biting. The episode lasted a few minutes. Per the history obtained, patient has been in his usual state of health over the past few days to weeks. He does have some unsteady gait, but has not been excessively lethargic nor has he had any issues with nausea vomiting.Patient does not have a history of seizures in the past. He had not been on hemodialysis prior to hospitalization although he has had several grafts placed in anticipation of starting HD. Patient now gets HD Tues, Thurs, Sat. Physical therapy on Monday, Wed. Friday.    REVIEW OF SYSTEMS: Full 14 system review of systems performed and notable only for agitation seizure weakness tremors back pain appetite hearing loss apnea restless legs snoring.   ALLERGIES: Allergies  Allergen Reactions  . Aspirin Other (See Comments)    Bleeding   . Lactose Intolerance (Gi) Diarrhea    HOME MEDICATIONS: Outpatient Prescriptions Prior to Visit  Medication Sig Dispense Refill  . ACCU-CHEK AVIVA PLUS test strip Check blood sugar three times daily    . amLODipine (  NORVASC) 10 MG tablet Take 10 mg by mouth Daily.     Marland Kitchen BESIVANCE 0.6 % SUSP Place 1 drop into the right eye 4 (four) times daily as needed. For 2 days after each monthly injection  1  . calcitRIOL (ROCALTROL) 0.5 MCG capsule Take 2 capsules (1 mcg total) by mouth every Monday, Wednesday, and Friday with hemodialysis.    Marland Kitchen  calcium acetate (PHOSLO) 667 MG capsule Take 2,001 mg by mouth 3 (three) times daily.   0  . cinacalcet (SENSIPAR) 30 MG tablet Take 2 tablets (60 mg total) by mouth daily with supper. 30 tablet 0  . colchicine 0.6 MG tablet as needed.     . cyclobenzaprine (FLEXERIL) 5 MG tablet Take 5 mg by mouth daily as needed.  0  . darbepoetin (ARANESP) 60 MCG/0.3ML SOLN injection Inject 0.3 mLs (60 mcg total) into the vein every Thursday with hemodialysis. (Patient taking differently: Inject 60 mcg into the vein every 7 (seven) days. ) 4.2 mL   . fexofenadine (ALLEGRA) 180 MG tablet Take 180 mg by mouth daily. As needed    . folic acid (FOLVITE) 1 MG tablet Take 1 tablet (1 mg total) by mouth daily. 30 tablet 0  . gabapentin (NEURONTIN) 100 MG capsule Take 1 capsule (100 mg total) by mouth daily. 30 capsule 0  . HUMALOG KWIKPEN 100 UNIT/ML KiwkPen Sliding Scale  0  . lacosamide 100 MG TABS Take 1 tablet (100 mg total) by mouth 2 (two) times daily. 60 tablet   . multivitamin (RENA-VIT) TABS tablet Take 1 tablet by mouth at bedtime. 30 tablet 0  . senna-docusate (SENOKOT-S) 8.6-50 MG per tablet Take 1 tablet by mouth. As needed    . sodium chloride 0.9 % SOLN 100 mL with ferric gluconate 12.5 MG/ML SOLN 62.5 mg Inject 62.5 mg into the vein every Thursday with hemodialysis.    Marland Kitchen triamcinolone (NASACORT ALLERGY 24HR) 55 MCG/ACT AERO nasal inhaler Place 1 spray into the nose. One spray nasal as needed for allery     No facility-administered medications prior to visit.    PHYSICAL EXAM There were no vitals filed for this visit. There is no weight on file to calculate BMI.  MMSE - Mini Mental State Exam 10/29/2014 04/22/2014 10/17/2013  Orientation to time 5 4 2   Orientation to Place 5 5 3   Registration 3 3 3   Attention/ Calculation 4 3 1   Recall 3 2 1   Language- name 2 objects 2 2 2   Language- repeat 1 1 1   Language- follow 3 step command 2 3 3   Language- read & follow direction 1 1 1   Write a sentence  1 1 1   Copy design 0 1 1  Total score 27 26 19    Generalized: Well developed, pleasant elderly male in no acute distress  Neck: Supple, no carotid bruits  Cardiac: Regular rate rhythm, BLOWING HOLOSYSTOLIC MURMUR, OVER RIGHT PRECORDIAL REGION; RIGHT ANTEBRACHIAL AV FISTULA WITH PALPABLE THRILL  NEUROLOGIC:  MENTAL STATUS: awake, alert, language fluent, comprehension intact, naming intact; MMSE 27/30 CRANIAL NERVE: pupils equal and reactive to light, visual fields full to confrontation, extraocular muscles intact, no nystagmus, facial sensation and strength symmetric, uvula midline, shoulder shrug symmetric, tongue midline.  MOTOR: normal bulk and tone, full strength in the BUE, BLE  SENSORY: ABSENT VIBRATION IN LOWER EXT  COORDINATION: finger-nose-finger, fine finger movements normal  REFLEXES: deep tendon reflexes present and symmetric, ABSENT BILAT KNEE AND ANKLE  GAIT/STATION: narrow based gait; UNSTEADY ON RISING,  romberg is negative, GAIT ASSISTED BY CANE.   DIAGNOSTICS/IMAGING:  01/17/13 MRI head: No acute infarct. Tiny area of blood breakdown products posterior left temporal lobe, left frontal lobe and right occipital lobe may represent result of prior hemorrhagic ischemia although result of prior trauma not entirely excluded. Broad-based (5 mm thickness) left hemispheric subdural collection with cerebrospinal fluid intensity suggestive of cystic hygroma (versus chronic subdural hematoma). Prominent small vessel disease type changes. Global atrophy without hydrocephalus. Paranasal sinus mucosal thickening. Cervical spondylotic changes with spinal stenosis and slight cord flattening C3-4 and left so at the C2-3 level.   01/17/13 EEG: This is an abnormal EEG secondary to general background slowing. Although this can be seen with drowse, cannot rule out the possibility of diffuse cerebral disturbance that is etiologically nonspecific but may include a metabolic encephalopathy among other  possibilities. Also noted was rare sharp activity over the left hemisphere.   10/06/13 MRI brain: moderate perisylvian and mesial temporal atrophy; moderate-severe chronic small vessel ischemic disease; numerous chronic cerebral microhemorrhages on SWAN views   06/21/15 MRI brain  - Negative for acute infarct. Moderate to advanced chronic microvascular ischemia. Scattered areas of chronic micro hemorrhage bilaterally most consistent with chronic hypertension - Subacute to chronic left-sided subdural hematoma slightly smaller now measuring 5 mm. Right-sided subdural collection has resolved in the interval. No new area of hemorrhage.    ASSESSMENT/PLAN:  81 y.o. right-handed male with Hypertension; Diabetes mellitus; GI bleed; ESRD (end stage renal disease); here for follow up of seizure disorder and memory loss.   Patient had seizure x 2 in May 2014, in the setting of pericortical microhemorrhage in the left temporal lobe and left sided sharp waves on EEG. Was stable on vimpat 50mg  BID.  Also had memory problems and metabolic encephalopathy, now much improved.  Then with fall in Sept 2016 (mild subdural hematomas), then confusion events (increased vimpat to 100mg  BID) and then recurrent event of weakness, gait diff (Oct 2016).   PLAN:  - continue vimpat 100mg  BID - caution with balance and walking - will refer to palliative care consult for advanced care planning  Return in about 6 months (around 12/23/2015).     02-17-1992, MD 06/24/2015, 3:31 PM Certified in Neurology, Neurophysiology and Neuroimaging  [facility] [address] [place], [place] [address] [phone]

## 2015-06-24 NOTE — Patient Instructions (Signed)
Thank you for coming to see [place] at [facility]. I hope we have been able to provide you high quality care today.  You may receive a patient satisfaction survey over the next few weeks. We would appreciate your feedback and comments so that we may continue to improve ourselves and the health of our patients.  - I will setup home palliative care consult for advanced care planning     ~~~~~~~~~~~~~~~~~~~~~~~~~~~~~~~~~~~~~~~~~~~~~~~~~~~~~~~~~~~~~~~~~  DR. PENUMALLI'S GUIDE TO HAPPY AND HEALTHY LIVING These are some of my general health and wellness recommendations. Some of them may apply to you better than others. Please use common sense as you try these suggestions and feel free to ask me any questions.   ACTIVITY/FITNESS Mental, social, emotional and physical stimulation are very important for brain and body health. Try learning a new activity (arts, music, language, sports, games).  Keep moving your body to the best of your abilities.    NUTRITION Eat more plants: colorful vegetables, nuts, seeds and berries.  Eat less sugar, salt, preservatives and processed foods.  Avoid toxins such as cigarettes and alcohol.  Drink water when you are thirsty. Warm water with a slice of lemon is an excellent morning drink to start the day.  Consider these websites for more information The Nutrition Source (https://www.henry-hernandez.biz/) [employer] Nutrition (WindowBlog.ch)   RELAXATION Consider practicing mindfulness meditation or other relaxation techniques such as deep breathing, prayer, yoga, tai chi, massage. See website mindful.org or the apps Headspace or Calm to help get started.   SLEEP Try to get at least 7-8+ hours sleep per day. Regular exercise and reduced caffeine will help you sleep better. Practice good sleep hygeine techniques. See website sleep.org for more information.   PLANNING Prepare estate planning,  living will, healthcare POA documents. Sometimes this is best planned with the help of an attorney. Theconversationproject.org and agingwithdignity.org are excellent resources.

## 2015-06-25 DIAGNOSIS — E1129 Type 2 diabetes mellitus with other diabetic kidney complication: Secondary | ICD-10-CM | POA: Diagnosis not present

## 2015-06-25 DIAGNOSIS — D631 Anemia in chronic kidney disease: Secondary | ICD-10-CM | POA: Diagnosis not present

## 2015-06-25 DIAGNOSIS — N186 End stage renal disease: Secondary | ICD-10-CM | POA: Diagnosis not present

## 2015-06-25 LAB — CULTURE, BLOOD (ROUTINE X 2)
CULTURE: NO GROWTH
Culture: NO GROWTH

## 2015-06-26 ENCOUNTER — Ambulatory Visit: Payer: Medicare Other | Admitting: Internal Medicine

## 2015-06-26 DIAGNOSIS — Z794 Long term (current) use of insulin: Secondary | ICD-10-CM | POA: Diagnosis not present

## 2015-06-26 DIAGNOSIS — N186 End stage renal disease: Secondary | ICD-10-CM | POA: Diagnosis not present

## 2015-06-26 DIAGNOSIS — Z9181 History of falling: Secondary | ICD-10-CM | POA: Diagnosis not present

## 2015-06-26 DIAGNOSIS — S065X0D Traumatic subdural hemorrhage without loss of consciousness, subsequent encounter: Secondary | ICD-10-CM | POA: Diagnosis not present

## 2015-06-26 DIAGNOSIS — I120 Hypertensive chronic kidney disease with stage 5 chronic kidney disease or end stage renal disease: Secondary | ICD-10-CM | POA: Diagnosis not present

## 2015-06-26 DIAGNOSIS — D696 Thrombocytopenia, unspecified: Secondary | ICD-10-CM | POA: Diagnosis not present

## 2015-06-26 DIAGNOSIS — M545 Low back pain: Secondary | ICD-10-CM | POA: Diagnosis not present

## 2015-06-26 DIAGNOSIS — G40909 Epilepsy, unspecified, not intractable, without status epilepticus: Secondary | ICD-10-CM | POA: Diagnosis not present

## 2015-06-26 DIAGNOSIS — Z992 Dependence on renal dialysis: Secondary | ICD-10-CM | POA: Diagnosis not present

## 2015-06-26 DIAGNOSIS — S0101XD Laceration without foreign body of scalp, subsequent encounter: Secondary | ICD-10-CM | POA: Diagnosis not present

## 2015-06-26 DIAGNOSIS — F0390 Unspecified dementia without behavioral disturbance: Secondary | ICD-10-CM | POA: Diagnosis not present

## 2015-06-26 DIAGNOSIS — E1122 Type 2 diabetes mellitus with diabetic chronic kidney disease: Secondary | ICD-10-CM | POA: Diagnosis not present

## 2015-06-26 DIAGNOSIS — M1990 Unspecified osteoarthritis, unspecified site: Secondary | ICD-10-CM | POA: Diagnosis not present

## 2015-06-27 DIAGNOSIS — D631 Anemia in chronic kidney disease: Secondary | ICD-10-CM | POA: Diagnosis not present

## 2015-06-27 DIAGNOSIS — E1129 Type 2 diabetes mellitus with other diabetic kidney complication: Secondary | ICD-10-CM | POA: Diagnosis not present

## 2015-06-27 DIAGNOSIS — N186 End stage renal disease: Secondary | ICD-10-CM | POA: Diagnosis not present

## 2015-06-30 DIAGNOSIS — E1129 Type 2 diabetes mellitus with other diabetic kidney complication: Secondary | ICD-10-CM | POA: Diagnosis not present

## 2015-06-30 DIAGNOSIS — D631 Anemia in chronic kidney disease: Secondary | ICD-10-CM | POA: Diagnosis not present

## 2015-06-30 DIAGNOSIS — N186 End stage renal disease: Secondary | ICD-10-CM | POA: Diagnosis not present

## 2015-07-01 DIAGNOSIS — M1990 Unspecified osteoarthritis, unspecified site: Secondary | ICD-10-CM | POA: Diagnosis not present

## 2015-07-01 DIAGNOSIS — N186 End stage renal disease: Secondary | ICD-10-CM | POA: Diagnosis not present

## 2015-07-01 DIAGNOSIS — I120 Hypertensive chronic kidney disease with stage 5 chronic kidney disease or end stage renal disease: Secondary | ICD-10-CM | POA: Diagnosis not present

## 2015-07-01 DIAGNOSIS — F0390 Unspecified dementia without behavioral disturbance: Secondary | ICD-10-CM | POA: Diagnosis not present

## 2015-07-01 DIAGNOSIS — D696 Thrombocytopenia, unspecified: Secondary | ICD-10-CM | POA: Diagnosis not present

## 2015-07-01 DIAGNOSIS — Z992 Dependence on renal dialysis: Secondary | ICD-10-CM | POA: Diagnosis not present

## 2015-07-01 DIAGNOSIS — M545 Low back pain: Secondary | ICD-10-CM | POA: Diagnosis not present

## 2015-07-01 DIAGNOSIS — Z9181 History of falling: Secondary | ICD-10-CM | POA: Diagnosis not present

## 2015-07-01 DIAGNOSIS — Z794 Long term (current) use of insulin: Secondary | ICD-10-CM | POA: Diagnosis not present

## 2015-07-01 DIAGNOSIS — S065X0D Traumatic subdural hemorrhage without loss of consciousness, subsequent encounter: Secondary | ICD-10-CM | POA: Diagnosis not present

## 2015-07-01 DIAGNOSIS — G40909 Epilepsy, unspecified, not intractable, without status epilepticus: Secondary | ICD-10-CM | POA: Diagnosis not present

## 2015-07-01 DIAGNOSIS — S0101XD Laceration without foreign body of scalp, subsequent encounter: Secondary | ICD-10-CM | POA: Diagnosis not present

## 2015-07-01 DIAGNOSIS — E1122 Type 2 diabetes mellitus with diabetic chronic kidney disease: Secondary | ICD-10-CM | POA: Diagnosis not present

## 2015-07-02 DIAGNOSIS — N186 End stage renal disease: Secondary | ICD-10-CM | POA: Diagnosis not present

## 2015-07-02 DIAGNOSIS — E1122 Type 2 diabetes mellitus with diabetic chronic kidney disease: Secondary | ICD-10-CM | POA: Diagnosis not present

## 2015-07-02 DIAGNOSIS — E1129 Type 2 diabetes mellitus with other diabetic kidney complication: Secondary | ICD-10-CM | POA: Diagnosis not present

## 2015-07-02 DIAGNOSIS — M1990 Unspecified osteoarthritis, unspecified site: Secondary | ICD-10-CM | POA: Diagnosis not present

## 2015-07-02 DIAGNOSIS — S065X0D Traumatic subdural hemorrhage without loss of consciousness, subsequent encounter: Secondary | ICD-10-CM | POA: Diagnosis not present

## 2015-07-02 DIAGNOSIS — S0101XD Laceration without foreign body of scalp, subsequent encounter: Secondary | ICD-10-CM | POA: Diagnosis not present

## 2015-07-02 DIAGNOSIS — G40909 Epilepsy, unspecified, not intractable, without status epilepticus: Secondary | ICD-10-CM | POA: Diagnosis not present

## 2015-07-02 DIAGNOSIS — M545 Low back pain: Secondary | ICD-10-CM | POA: Diagnosis not present

## 2015-07-02 DIAGNOSIS — Z992 Dependence on renal dialysis: Secondary | ICD-10-CM | POA: Diagnosis not present

## 2015-07-02 DIAGNOSIS — I120 Hypertensive chronic kidney disease with stage 5 chronic kidney disease or end stage renal disease: Secondary | ICD-10-CM | POA: Diagnosis not present

## 2015-07-02 DIAGNOSIS — D631 Anemia in chronic kidney disease: Secondary | ICD-10-CM | POA: Diagnosis not present

## 2015-07-02 DIAGNOSIS — D696 Thrombocytopenia, unspecified: Secondary | ICD-10-CM | POA: Diagnosis not present

## 2015-07-02 DIAGNOSIS — F0390 Unspecified dementia without behavioral disturbance: Secondary | ICD-10-CM | POA: Diagnosis not present

## 2015-07-02 DIAGNOSIS — Z9181 History of falling: Secondary | ICD-10-CM | POA: Diagnosis not present

## 2015-07-02 DIAGNOSIS — Z794 Long term (current) use of insulin: Secondary | ICD-10-CM | POA: Diagnosis not present

## 2015-07-04 DIAGNOSIS — D631 Anemia in chronic kidney disease: Secondary | ICD-10-CM | POA: Diagnosis not present

## 2015-07-04 DIAGNOSIS — E1129 Type 2 diabetes mellitus with other diabetic kidney complication: Secondary | ICD-10-CM | POA: Diagnosis not present

## 2015-07-04 DIAGNOSIS — N186 End stage renal disease: Secondary | ICD-10-CM | POA: Diagnosis not present

## 2015-07-07 DIAGNOSIS — I120 Hypertensive chronic kidney disease with stage 5 chronic kidney disease or end stage renal disease: Secondary | ICD-10-CM | POA: Diagnosis not present

## 2015-07-07 DIAGNOSIS — D631 Anemia in chronic kidney disease: Secondary | ICD-10-CM | POA: Diagnosis not present

## 2015-07-07 DIAGNOSIS — R1310 Dysphagia, unspecified: Secondary | ICD-10-CM | POA: Diagnosis not present

## 2015-07-07 DIAGNOSIS — E119 Type 2 diabetes mellitus without complications: Secondary | ICD-10-CM | POA: Diagnosis not present

## 2015-07-07 DIAGNOSIS — E1129 Type 2 diabetes mellitus with other diabetic kidney complication: Secondary | ICD-10-CM | POA: Diagnosis not present

## 2015-07-07 DIAGNOSIS — Z992 Dependence on renal dialysis: Secondary | ICD-10-CM | POA: Diagnosis not present

## 2015-07-07 DIAGNOSIS — K219 Gastro-esophageal reflux disease without esophagitis: Secondary | ICD-10-CM | POA: Diagnosis not present

## 2015-07-07 DIAGNOSIS — N186 End stage renal disease: Secondary | ICD-10-CM | POA: Diagnosis not present

## 2015-07-08 DIAGNOSIS — F0390 Unspecified dementia without behavioral disturbance: Secondary | ICD-10-CM | POA: Diagnosis not present

## 2015-07-08 DIAGNOSIS — Z9181 History of falling: Secondary | ICD-10-CM | POA: Diagnosis not present

## 2015-07-08 DIAGNOSIS — N186 End stage renal disease: Secondary | ICD-10-CM | POA: Diagnosis not present

## 2015-07-08 DIAGNOSIS — E1122 Type 2 diabetes mellitus with diabetic chronic kidney disease: Secondary | ICD-10-CM | POA: Diagnosis not present

## 2015-07-08 DIAGNOSIS — D696 Thrombocytopenia, unspecified: Secondary | ICD-10-CM | POA: Diagnosis not present

## 2015-07-08 DIAGNOSIS — S0101XD Laceration without foreign body of scalp, subsequent encounter: Secondary | ICD-10-CM | POA: Diagnosis not present

## 2015-07-08 DIAGNOSIS — M545 Low back pain: Secondary | ICD-10-CM | POA: Diagnosis not present

## 2015-07-08 DIAGNOSIS — G40909 Epilepsy, unspecified, not intractable, without status epilepticus: Secondary | ICD-10-CM | POA: Diagnosis not present

## 2015-07-08 DIAGNOSIS — Z794 Long term (current) use of insulin: Secondary | ICD-10-CM | POA: Diagnosis not present

## 2015-07-08 DIAGNOSIS — Z992 Dependence on renal dialysis: Secondary | ICD-10-CM | POA: Diagnosis not present

## 2015-07-08 DIAGNOSIS — M1990 Unspecified osteoarthritis, unspecified site: Secondary | ICD-10-CM | POA: Diagnosis not present

## 2015-07-08 DIAGNOSIS — I120 Hypertensive chronic kidney disease with stage 5 chronic kidney disease or end stage renal disease: Secondary | ICD-10-CM | POA: Diagnosis not present

## 2015-07-08 DIAGNOSIS — S065X0D Traumatic subdural hemorrhage without loss of consciousness, subsequent encounter: Secondary | ICD-10-CM | POA: Diagnosis not present

## 2015-07-09 DIAGNOSIS — D509 Iron deficiency anemia, unspecified: Secondary | ICD-10-CM | POA: Diagnosis not present

## 2015-07-09 DIAGNOSIS — D689 Coagulation defect, unspecified: Secondary | ICD-10-CM | POA: Diagnosis not present

## 2015-07-09 DIAGNOSIS — N186 End stage renal disease: Secondary | ICD-10-CM | POA: Diagnosis not present

## 2015-07-09 DIAGNOSIS — D631 Anemia in chronic kidney disease: Secondary | ICD-10-CM | POA: Diagnosis not present

## 2015-07-10 DIAGNOSIS — M1990 Unspecified osteoarthritis, unspecified site: Secondary | ICD-10-CM | POA: Diagnosis not present

## 2015-07-10 DIAGNOSIS — S0101XD Laceration without foreign body of scalp, subsequent encounter: Secondary | ICD-10-CM | POA: Diagnosis not present

## 2015-07-10 DIAGNOSIS — M545 Low back pain: Secondary | ICD-10-CM | POA: Diagnosis not present

## 2015-07-10 DIAGNOSIS — E1122 Type 2 diabetes mellitus with diabetic chronic kidney disease: Secondary | ICD-10-CM | POA: Diagnosis not present

## 2015-07-10 DIAGNOSIS — Z992 Dependence on renal dialysis: Secondary | ICD-10-CM | POA: Diagnosis not present

## 2015-07-10 DIAGNOSIS — S065X0D Traumatic subdural hemorrhage without loss of consciousness, subsequent encounter: Secondary | ICD-10-CM | POA: Diagnosis not present

## 2015-07-10 DIAGNOSIS — I120 Hypertensive chronic kidney disease with stage 5 chronic kidney disease or end stage renal disease: Secondary | ICD-10-CM | POA: Diagnosis not present

## 2015-07-10 DIAGNOSIS — N186 End stage renal disease: Secondary | ICD-10-CM | POA: Diagnosis not present

## 2015-07-10 DIAGNOSIS — Z794 Long term (current) use of insulin: Secondary | ICD-10-CM | POA: Diagnosis not present

## 2015-07-10 DIAGNOSIS — D696 Thrombocytopenia, unspecified: Secondary | ICD-10-CM | POA: Diagnosis not present

## 2015-07-10 DIAGNOSIS — F0390 Unspecified dementia without behavioral disturbance: Secondary | ICD-10-CM | POA: Diagnosis not present

## 2015-07-10 DIAGNOSIS — Z9181 History of falling: Secondary | ICD-10-CM | POA: Diagnosis not present

## 2015-07-10 DIAGNOSIS — G40909 Epilepsy, unspecified, not intractable, without status epilepticus: Secondary | ICD-10-CM | POA: Diagnosis not present

## 2015-07-11 DIAGNOSIS — N186 End stage renal disease: Secondary | ICD-10-CM | POA: Diagnosis not present

## 2015-07-11 DIAGNOSIS — D509 Iron deficiency anemia, unspecified: Secondary | ICD-10-CM | POA: Diagnosis not present

## 2015-07-11 DIAGNOSIS — D631 Anemia in chronic kidney disease: Secondary | ICD-10-CM | POA: Diagnosis not present

## 2015-07-11 DIAGNOSIS — D689 Coagulation defect, unspecified: Secondary | ICD-10-CM | POA: Diagnosis not present

## 2015-07-14 DIAGNOSIS — D689 Coagulation defect, unspecified: Secondary | ICD-10-CM | POA: Diagnosis not present

## 2015-07-14 DIAGNOSIS — D509 Iron deficiency anemia, unspecified: Secondary | ICD-10-CM | POA: Diagnosis not present

## 2015-07-14 DIAGNOSIS — N186 End stage renal disease: Secondary | ICD-10-CM | POA: Diagnosis not present

## 2015-07-14 DIAGNOSIS — D631 Anemia in chronic kidney disease: Secondary | ICD-10-CM | POA: Diagnosis not present

## 2015-07-15 ENCOUNTER — Other Ambulatory Visit: Payer: Self-pay

## 2015-07-15 ENCOUNTER — Telehealth: Payer: Self-pay

## 2015-07-15 DIAGNOSIS — Z992 Dependence on renal dialysis: Secondary | ICD-10-CM | POA: Diagnosis not present

## 2015-07-15 DIAGNOSIS — E1122 Type 2 diabetes mellitus with diabetic chronic kidney disease: Secondary | ICD-10-CM | POA: Diagnosis not present

## 2015-07-15 DIAGNOSIS — N186 End stage renal disease: Secondary | ICD-10-CM | POA: Diagnosis not present

## 2015-07-15 DIAGNOSIS — Z9181 History of falling: Secondary | ICD-10-CM | POA: Diagnosis not present

## 2015-07-15 DIAGNOSIS — D696 Thrombocytopenia, unspecified: Secondary | ICD-10-CM | POA: Diagnosis not present

## 2015-07-15 DIAGNOSIS — S0101XD Laceration without foreign body of scalp, subsequent encounter: Secondary | ICD-10-CM | POA: Diagnosis not present

## 2015-07-15 DIAGNOSIS — I120 Hypertensive chronic kidney disease with stage 5 chronic kidney disease or end stage renal disease: Secondary | ICD-10-CM | POA: Diagnosis not present

## 2015-07-15 DIAGNOSIS — M545 Low back pain: Secondary | ICD-10-CM | POA: Diagnosis not present

## 2015-07-15 DIAGNOSIS — F0390 Unspecified dementia without behavioral disturbance: Secondary | ICD-10-CM | POA: Diagnosis not present

## 2015-07-15 DIAGNOSIS — S065X0D Traumatic subdural hemorrhage without loss of consciousness, subsequent encounter: Secondary | ICD-10-CM | POA: Diagnosis not present

## 2015-07-15 DIAGNOSIS — M1990 Unspecified osteoarthritis, unspecified site: Secondary | ICD-10-CM | POA: Diagnosis not present

## 2015-07-15 DIAGNOSIS — Z794 Long term (current) use of insulin: Secondary | ICD-10-CM | POA: Diagnosis not present

## 2015-07-15 DIAGNOSIS — G40909 Epilepsy, unspecified, not intractable, without status epilepticus: Secondary | ICD-10-CM | POA: Diagnosis not present

## 2015-07-15 MED ORDER — CALCIUM ACETATE 667 MG PO CAPS: ORAL_CAPSULE | ORAL | Status: DC

## 2015-07-15 NOTE — Telephone Encounter (Signed)
[name] the therapist called to ask for an order to be sent for patient to receive physical therapy spoke with [name] she will fax order to be signed

## 2015-07-16 DIAGNOSIS — D689 Coagulation defect, unspecified: Secondary | ICD-10-CM | POA: Diagnosis not present

## 2015-07-16 DIAGNOSIS — D509 Iron deficiency anemia, unspecified: Secondary | ICD-10-CM | POA: Diagnosis not present

## 2015-07-16 DIAGNOSIS — N186 End stage renal disease: Secondary | ICD-10-CM | POA: Diagnosis not present

## 2015-07-16 DIAGNOSIS — D631 Anemia in chronic kidney disease: Secondary | ICD-10-CM | POA: Diagnosis not present

## 2015-07-17 DIAGNOSIS — E113213 Type 2 diabetes mellitus with mild nonproliferative diabetic retinopathy with macular edema, bilateral: Secondary | ICD-10-CM | POA: Diagnosis not present

## 2015-07-17 DIAGNOSIS — H40013 Open angle with borderline findings, low risk, bilateral: Secondary | ICD-10-CM | POA: Diagnosis not present

## 2015-07-18 DIAGNOSIS — N186 End stage renal disease: Secondary | ICD-10-CM | POA: Diagnosis not present

## 2015-07-18 DIAGNOSIS — D631 Anemia in chronic kidney disease: Secondary | ICD-10-CM | POA: Diagnosis not present

## 2015-07-18 DIAGNOSIS — D509 Iron deficiency anemia, unspecified: Secondary | ICD-10-CM | POA: Diagnosis not present

## 2015-07-18 DIAGNOSIS — D689 Coagulation defect, unspecified: Secondary | ICD-10-CM | POA: Diagnosis not present

## 2015-07-21 ENCOUNTER — Ambulatory Visit (INDEPENDENT_AMBULATORY_CARE_PROVIDER_SITE_OTHER): Payer: Medicare Other | Admitting: Internal Medicine

## 2015-07-21 ENCOUNTER — Encounter: Payer: Self-pay | Admitting: Internal Medicine

## 2015-07-21 VITALS — BP 118/70 | HR 71 | Temp 97.4°F | Resp 20 | Ht 73.0 in | Wt 204.6 lb

## 2015-07-21 DIAGNOSIS — N186 End stage renal disease: Secondary | ICD-10-CM | POA: Diagnosis not present

## 2015-07-21 DIAGNOSIS — E1122 Type 2 diabetes mellitus with diabetic chronic kidney disease: Secondary | ICD-10-CM | POA: Diagnosis not present

## 2015-07-21 DIAGNOSIS — N2581 Secondary hyperparathyroidism of renal origin: Secondary | ICD-10-CM

## 2015-07-21 DIAGNOSIS — N189 Chronic kidney disease, unspecified: Secondary | ICD-10-CM | POA: Diagnosis not present

## 2015-07-21 DIAGNOSIS — Z794 Long term (current) use of insulin: Secondary | ICD-10-CM | POA: Diagnosis not present

## 2015-07-21 DIAGNOSIS — G40909 Epilepsy, unspecified, not intractable, without status epilepticus: Secondary | ICD-10-CM

## 2015-07-21 DIAGNOSIS — D689 Coagulation defect, unspecified: Secondary | ICD-10-CM | POA: Diagnosis not present

## 2015-07-21 DIAGNOSIS — Z992 Dependence on renal dialysis: Secondary | ICD-10-CM

## 2015-07-21 DIAGNOSIS — I6200 Nontraumatic subdural hemorrhage, unspecified: Secondary | ICD-10-CM | POA: Diagnosis not present

## 2015-07-21 DIAGNOSIS — M5136 Other intervertebral disc degeneration, lumbar region: Secondary | ICD-10-CM | POA: Diagnosis not present

## 2015-07-21 DIAGNOSIS — S065X9A Traumatic subdural hemorrhage with loss of consciousness of unspecified duration, initial encounter: Secondary | ICD-10-CM

## 2015-07-21 DIAGNOSIS — S065XAA Traumatic subdural hemorrhage with loss of consciousness status unknown, initial encounter: Secondary | ICD-10-CM

## 2015-07-21 DIAGNOSIS — D509 Iron deficiency anemia, unspecified: Secondary | ICD-10-CM | POA: Diagnosis not present

## 2015-07-21 DIAGNOSIS — D631 Anemia in chronic kidney disease: Secondary | ICD-10-CM | POA: Diagnosis not present

## 2015-07-21 MED ORDER — LACOSAMIDE 100 MG PO TABS: 100.0000 mg | ORAL_TABLET | Freq: Two times a day (BID) | ORAL | Status: DC

## 2015-07-21 MED ORDER — GLUCOSE BLOOD VI STRP: ORAL_STRIP | Status: DC

## 2015-07-21 NOTE — Patient Instructions (Signed)
Mucinex plain (no DM or D) twice daily for congestion  Coricidin bp cough syrup for cough--2 teaspoons every 6 hours as needed for coughing  If fever or chills develop, intake goes down, please call back for antibiotics and further assessment.

## 2015-07-21 NOTE — Progress Notes (Signed)
Patient ID: [name], male   DOB: 15-Mar-1932, 81 y.o.   MRN: [id number]   Location: [facility] Provider: Gwenith Spitz. Renato Gails, D.O., C.M.D.  Code Status: full code Goals of Care: Advanced Directive information Does patient have an advance directive?: Yes  Chief Complaint  Patient presents with  . Hospitalization Follow-up    TOC-Hospital Follow-up    HPI: Patient is a 80 y.o. male seen in the office today for hospital f/u.  He was hospitalized when he was too weak to get out of bed and having altered mental status. He turned out to be bradycardic.  After his hospitalization, he then went to rehab at SNF through ~07/14/15, 7 days ago.  He now notes that he has been ill all weekend and had seizures this am.  Was coughing, sneezing, wheezing.  When he got up this am, he had seizures--sat down on the floor once when he started to feel he'd fall.  Says his left hand shook a little bit.  Was pouring coffee and almost spilled it in his lap.  No headache.  He is hoarse.  Eating ok.  Drinking ok.     Previous hospitalization, fell and hit his head coming out of the bathroom.  Using walker, turned and fell and hit his head.  MRI this most recent admission showed improving left-sided subdural hematoma and resolution of right-sided subdural hematoma.    He is to have home health PT.    He is now off all blood thinners after two episodes of subdural hematomas and GI bleeding.    As usual, he and his wife are here alone and both have cognitive decline and cannot provide very good history.    I figured out over the course of the visit that he's been out of his vimpat since just before he was hospitalized this last admission (given in Sept and he thought he wasn't to take it anymore when it had no refills).  Also determined he was out of test strips to check his glucose.t admission (given in Sept and he thought he wasn't to take  Review of Systems:  Review of Systems  Constitutional: Positive  for malaise/fatigue. Negative for fever and chills.  HENT: Positive for congestion.   Eyes: Negative for blurred vision.  Respiratory: Positive for cough and sputum production. Negative for shortness of breath.   Cardiovascular: Negative for chest pain.  Gastrointestinal: Negative for abdominal pain, constipation, blood in stool and melena.  Genitourinary: Negative for dysuria.  Musculoskeletal: Positive for falls.  Skin: Negative for rash.  Neurological: Positive for seizures, loss of consciousness and weakness. Negative for dizziness.  Endo/Heme/Allergies: Does not bruise/bleed easily.  Psychiatric/Behavioral: Positive for memory loss.    Past Medical History  Diagnosis Date  . Hypertension   . Diabetes mellitus without complication (HCC)   . Arthritis   . History of blood transfusion   . GI bleed   . ESRD (end stage renal disease) (HCC)   . Seizure (HCC)   . Dialysis patient (HCC)   . Subdural hematoma (HCC) 05/2015    from fall    Past Surgical History  Procedure Laterality Date  . Esophagogastroduodenoscopy  10/03/2012    Procedure: ESOPHAGOGASTRODUODENOSCOPY (EGD);  Surgeon: Theda Belfast, MD;  Location: [facility] ENDOSCOPY;  Service: Endoscopy;  Laterality: N/A;  . Colonoscopy  10/04/2012    Procedure: COLONOSCOPY;  Surgeon: Theda Belfast, MD;  Location: [facility] ENDOSCOPY;  Service: Endoscopy;  Laterality: N/A;  . Colonoscopy with esophagogastroduodenoscopy (egd)  Left 11/30/2012    Procedure: COLONOSCOPY WITH ESOPHAGOGASTRODUODENOSCOPY (EGD);  Surgeon: Theda Belfast, MD;  Location: [facility] ENDOSCOPY;  Service: Endoscopy;  Laterality: Left;  . Spine surgery  2004    back fusion/ [facility] Penumalli,MD  . Retinal laser procedure Right 04/2015    Allergies  Allergen Reactions  . Aspirin Other (See Comments)    Bleeding   . Lactose Intolerance (Gi) Diarrhea      Medication List       This list is accurate as of: 07/21/15  3:50 PM.  Always use your most recent med list.                ACCU-CHEK AVIVA PLUS test strip  Generic drug:  glucose blood  Check blood sugar three times daily     amLODipine 10 MG tablet  Commonly known as:  NORVASC  Take 10 mg by mouth Daily.     BESIVANCE 0.6 % Susp  Generic drug:  Besifloxacin HCl  Place 1 drop into the right eye 4 (four) times daily as needed. For 2 days after each monthly injection     calcitRIOL 0.5 MCG capsule  Commonly known as:  ROCALTROL  Take 2 capsules (1 mcg total) by mouth every Monday, Wednesday, and Friday with hemodialysis.     calcium acetate 667 MG capsule  Commonly known as:  PHOSLO  Take 3 capsules by mouth three times a day     cinacalcet 30 MG tablet  Commonly known as:  SENSIPAR  Take 2 tablets (60 mg total) by mouth daily with supper.     colchicine 0.6 MG tablet  as needed.     cyclobenzaprine 5 MG tablet  Commonly known as:  FLEXERIL  Take 5 mg by mouth daily as needed.     darbepoetin 60 MCG/0.3ML Soln injection  Commonly known as:  ARANESP  Inject 0.3 mLs (60 mcg total) into the vein every Thursday with hemodialysis.     fexofenadine 180 MG tablet  Commonly known as:  ALLEGRA  Take 180 mg by mouth daily. As needed     folic acid 1 MG tablet  Commonly known as:  FOLVITE  Take 1 tablet (1 mg total) by mouth daily.     gabapentin 100 MG capsule  Commonly known as:  NEURONTIN  Take 1 capsule (100 mg total) by mouth daily.     HUMALOG KWIKPEN 100 UNIT/ML KiwkPen  Generic drug:  insulin lispro  Sliding Scale     Lacosamide 100 MG Tabs  Take 1 tablet (100 mg total) by mouth 2 (two) times daily.     multivitamin Tabs tablet  Take 1 tablet by mouth at bedtime.     NASACORT ALLERGY 24HR 55 MCG/ACT Aero nasal inhaler  Generic drug:  triamcinolone  Place 1 spray into the nose. One spray nasal as needed for allery     senna-docusate 8.6-50 MG tablet  Commonly known as:  Senokot-S  Take 1 tablet by mouth. As needed     sodium chloride 0.9 % SOLN 100 mL with ferric gluconate  12.5 MG/ML SOLN 62.5 mg  Inject 62.5 mg into the vein every Thursday with hemodialysis.        Health Maintenance  Topic Date Due  . OPHTHALMOLOGY EXAM  [birth year]/24/1942  . URINE MICROALBUMIN  01/24/1944  . TETANUS/TDAP  01/23/1953  . ZOSTAVAX  01/23/1994  . HEMOGLOBIN A1C  11/10/2015  . FOOT EXAM  02/13/2016  . INFLUENZA VACCINE  04/06/2016  . PNA  vac Low Risk Adult (2 of 2 - PPSV23) 06/08/2016    Physical Exam: Filed Vitals:   07/21/15 1513  BP: 118/70  Pulse: 71  Temp: 97.4 F (36.3 C)  TempSrc: Oral  Resp: 20  Height: 6\' 1"  (1.854 m)  Weight: 204 lb 9.6 oz (92.806 kg)  SpO2: 98%   Body mass index is 27 kg/(m^2). Physical Exam  Constitutional: He appears well-developed and well-nourished. No distress.  Eyes:  Eyes are swollen today  Cardiovascular: Normal rate, regular rhythm and normal heart sounds.   Pulmonary/Chest: Effort normal and breath sounds normal.  Abdominal: Soft. Bowel sounds are normal.  Musculoskeletal: Normal range of motion. He exhibits tenderness. He exhibits no edema.  Stooped posture and chronic low back pain  Neurological: He is alert.  Oriented to person, place, not time; retells stories form the distant past like they happened yesterday  Psychiatric: He has a normal mood and affect.    Labs reviewed: Basic Metabolic Panel:  Recent Labs  2105  05/23/15 0854  06/20/15 1630 06/20/15 2318 06/21/15 1420 06/22/15 0530 06/23/15 1023  NA  --   < > 137  < > 133* 135  --  139 134*  K  --   < > 4.7  < > 5.0 4.4  --  4.6 5.1  CL  --   < > 101  < > 98* 96*  --  99* 99*  CO2  --   < > 25  --  24 27  --  28 24  GLUCOSE  --   < > 144*  < > 228* 147*  --  149* 184*  BUN  --   < > 58*  < > 60* 38*  --  54* 70*  CREATININE  --   < > 8.58*  < > 7.27* 5.89*  --  8.22* 9.91*  CALCIUM  --   < > 8.9  --  8.6* 9.0  --  9.3 9.1  MG 2.2  --   --   --   --  2.5*  --   --   --   PHOS  --   < > 6.3*  --  2.6  --   --   --  4.0  TSH  --   --   --   --    --   --  1.607  --   --   < > = values in this interval not displayed. Liver Function Tests:  Recent Labs  08/15/14 1059  06/20/15 1630 06/20/15 2318 06/23/15 1023  AST 24  --   --  19  --   ALT 15  --   --  15*  --   ALKPHOS 56  --   --  43  --   BILITOT 0.4  --   --  0.7  --   PROT 7.1  --   --  7.1  --   ALBUMIN 4.5  < > 3.7 4.1 3.5  < > = values in this interval not displayed. No results for input(s): LIPASE, AMYLASE in the last 8760 hours.  Recent Labs  06/21/15 1420  AMMONIA 27   CBC:  Recent Labs  05/21/15 0815  05/22/15 2230 06/20/15 0809 06/20/15 0810 06/23/15 1022  WBC 3.7*  < > 4.4 3.8*  --  3.8*  NEUTROABS 2.0  --   --  2.2  --  2.2  HGB 10.4*  < > 10.7* 10.2* 11.6* 9.2*  HCT 30.9*  < > 32.7* 31.5* 34.0* 28.0*  MCV 102.3*  < > 102.5* 100.6*  --  99.3  PLT 95*  < > 88* 89*  --  101*  < > = values in this interval not displayed. Lipid Panel:  Recent Labs  05/13/15 0925 05/20/15 0313  CHOL 107 111  HDL 66 53  LDLCALC 31 45  TRIG 48 63  CHOLHDL 1.6 2.1   Lab Results  Component Value Date   HGBA1C 5.0 05/13/2015    Procedures since last visit: Ct Head Wo Contrast  06/20/2015  CLINICAL DATA:  General body weakness, unable to get out of bed this morning. EXAM: CT HEAD WITHOUT CONTRAST TECHNIQUE: Contiguous axial images were obtained from the base of the skull through the vertex without intravenous contrast. COMPARISON:  05/21/2015 FINDINGS: Isodense to hypodense small left subdural hematoma, 5 mm in thickness. This is stable size when compared to prior study with evolutionary changes. No new hemorrhage. There is atrophy and chronic small vessel disease changes. No acute infarct. No hydrocephalus. No acute calvarial abnormality. IMPRESSION: Stable size of the small left subdural hematoma with evolutionary changes. Atrophy, chronic small vessel disease. Electronically Signed   By: Charlett Nose M.D.   On: 06/20/2015 09:07   Mr Brain Wo  Contrast  06/21/2015  CLINICAL DATA:  Altered mental status.  History of subdural hematoma EXAM: MRI HEAD WITHOUT CONTRAST TECHNIQUE: Multiplanar, multiecho pulse sequences of the brain and surrounding structures were obtained without intravenous contrast. COMPARISON:  CT head 06/20/2015, MRI 05/21/2015 FINDINGS: Left hemisphere subdural hematoma slightly smaller now measuring 5 mm in thickness. Typical evolutionary changes with methemoglobin present. Right-sided subdural collection has resolved. No new area of hemorrhage. Generalized atrophy. Negative for hydrocephalus. No significant shift of the midline structures. Negative for acute infarct. Chronic microvascular ischemic change throughout the white matter of a moderate degree. Brainstem intact. Basal ganglia intact. Scattered areas of chronic micro hemorrhage in the left temporoparietal lobe and bilateral occipital lobe. This is most likely related to chronic hypertension. IMPRESSION: Negative for acute infarct. Moderate to advanced chronic microvascular ischemia. Scattered areas of chronic micro hemorrhage bilaterally most consistent with chronic hypertension Subacute to chronic left-sided subdural hematoma slightly smaller now measuring 5 mm. Right-sided subdural collection has resolved in the interval. No new area of hemorrhage. Electronically Signed   By: Marlan Palau M.D.   On: 06/21/2015 16:22   Dg Hip Unilat With Pelvis 2-3 Views Left  06/20/2015  CLINICAL DATA:  Chronic left hip pain. Unable to get out of bed or bear weight. EXAM: DG HIP (WITH OR WITHOUT PELVIS) 2-3V LEFT COMPARISON:  None. FINDINGS: Degenerative changes are noted in the lower lumbar spine. The hips are located bilaterally. No acute fracture is present. Extensive atherosclerotic changes are present. IMPRESSION: 1. Degenerative changes in the left hip. No acute abnormality. The patient is unable bear weight, CT or preferably MRI could be used for further evaluation of occult  fracture. 2. Atherosclerosis. Electronically Signed   By: Marin Roberts M.D.   On: 06/20/2015 10:57    Assessment/Plan 1. Secondary seizure disorder (HCC) -secondary to subdural hematomas -pt did not refill medication so has not taken it in over a month which explains why he had seizure activity this am and possibly part of what was going on when he was last admitted--again, entire situation unclear b/c no reliable historians are present during the visit (his wife's dementia is worse than his) - Lacosamide 100 MG TABS; Take 1  tablet (100 mg total) by mouth 2 (two) times daily.  Dispense: 60 tablet; Refill: 3  2. SDH (subdural hematoma) x 2 -improved on last MRI -fortunately, is now off anticoagulation after this and a h/o GI bleed requiring cauterization in the past  3. Controlled type 2 diabetes mellitus with chronic kidney disease on chronic dialysis, with long-term current use of insulin (HCC) - was out of test strips so renewed--says he gets at [facility]? -cont diet control - glucose blood (ACCU-CHEK AVIVA PLUS) test strip; Check blood sugar three times daily  Dispense: 100 each; Refill: 3  4. Anemia in chronic kidney disease -as per nephrology-on aranesp q 7 days as needed  5. Degenerative disc disease, lumbar -low back pain longstanding and worse after HD -cont gabapentin low dose which was to be at bedtime -may need to be increased if tolerates for pain, but this was not done today due to my concerns about his seizures this am  6. Secondary hyperparathyroidism, renal (HCC) -cont on calcitriol, phoslo, sensipar -monitored by nephrology  Labs/tests ordered:  No new Next appt:  F/u in Dec (one month) as scheduled and return if cold symptoms are not improving with conservative measures of mucinex and coricidin bp otc   [name], D.O. Geriatrics [facility] [address], [place] [address] Cell Phone (Mon-Fri 8am-5pm):   [phone] On Call:  [phone] & follow prompts after 5pm & weekends Office Phone:  [phone] Office Fax:  [phone]

## 2015-07-22 DIAGNOSIS — F0390 Unspecified dementia without behavioral disturbance: Secondary | ICD-10-CM | POA: Diagnosis not present

## 2015-07-22 DIAGNOSIS — M1990 Unspecified osteoarthritis, unspecified site: Secondary | ICD-10-CM | POA: Diagnosis not present

## 2015-07-22 DIAGNOSIS — G40909 Epilepsy, unspecified, not intractable, without status epilepticus: Secondary | ICD-10-CM | POA: Diagnosis not present

## 2015-07-22 DIAGNOSIS — D696 Thrombocytopenia, unspecified: Secondary | ICD-10-CM | POA: Diagnosis not present

## 2015-07-22 DIAGNOSIS — E1122 Type 2 diabetes mellitus with diabetic chronic kidney disease: Secondary | ICD-10-CM | POA: Diagnosis not present

## 2015-07-22 DIAGNOSIS — I120 Hypertensive chronic kidney disease with stage 5 chronic kidney disease or end stage renal disease: Secondary | ICD-10-CM | POA: Diagnosis not present

## 2015-07-22 DIAGNOSIS — Z794 Long term (current) use of insulin: Secondary | ICD-10-CM | POA: Diagnosis not present

## 2015-07-22 DIAGNOSIS — S0101XD Laceration without foreign body of scalp, subsequent encounter: Secondary | ICD-10-CM | POA: Diagnosis not present

## 2015-07-22 DIAGNOSIS — Z992 Dependence on renal dialysis: Secondary | ICD-10-CM | POA: Diagnosis not present

## 2015-07-22 DIAGNOSIS — N186 End stage renal disease: Secondary | ICD-10-CM | POA: Diagnosis not present

## 2015-07-22 DIAGNOSIS — S065X0D Traumatic subdural hemorrhage without loss of consciousness, subsequent encounter: Secondary | ICD-10-CM | POA: Diagnosis not present

## 2015-07-22 DIAGNOSIS — M545 Low back pain: Secondary | ICD-10-CM | POA: Diagnosis not present

## 2015-07-22 DIAGNOSIS — Z9181 History of falling: Secondary | ICD-10-CM | POA: Diagnosis not present

## 2015-07-23 ENCOUNTER — Other Ambulatory Visit: Payer: Self-pay | Admitting: *Deleted

## 2015-07-23 DIAGNOSIS — D509 Iron deficiency anemia, unspecified: Secondary | ICD-10-CM | POA: Diagnosis not present

## 2015-07-23 DIAGNOSIS — N186 End stage renal disease: Secondary | ICD-10-CM | POA: Diagnosis not present

## 2015-07-23 DIAGNOSIS — D689 Coagulation defect, unspecified: Secondary | ICD-10-CM | POA: Diagnosis not present

## 2015-07-23 DIAGNOSIS — D631 Anemia in chronic kidney disease: Secondary | ICD-10-CM | POA: Diagnosis not present

## 2015-07-23 MED ORDER — CYCLOBENZAPRINE HCL 5 MG PO TABS: ORAL_TABLET | ORAL | Status: DC

## 2015-07-23 NOTE — Telephone Encounter (Signed)
[facility] [address] 

## 2015-07-24 DIAGNOSIS — Z9181 History of falling: Secondary | ICD-10-CM | POA: Diagnosis not present

## 2015-07-24 DIAGNOSIS — N186 End stage renal disease: Secondary | ICD-10-CM | POA: Diagnosis not present

## 2015-07-24 DIAGNOSIS — M1990 Unspecified osteoarthritis, unspecified site: Secondary | ICD-10-CM | POA: Diagnosis not present

## 2015-07-24 DIAGNOSIS — Z794 Long term (current) use of insulin: Secondary | ICD-10-CM | POA: Diagnosis not present

## 2015-07-24 DIAGNOSIS — M545 Low back pain: Secondary | ICD-10-CM | POA: Diagnosis not present

## 2015-07-24 DIAGNOSIS — D696 Thrombocytopenia, unspecified: Secondary | ICD-10-CM | POA: Diagnosis not present

## 2015-07-24 DIAGNOSIS — S0101XD Laceration without foreign body of scalp, subsequent encounter: Secondary | ICD-10-CM | POA: Diagnosis not present

## 2015-07-24 DIAGNOSIS — G40909 Epilepsy, unspecified, not intractable, without status epilepticus: Secondary | ICD-10-CM | POA: Diagnosis not present

## 2015-07-24 DIAGNOSIS — S065X0D Traumatic subdural hemorrhage without loss of consciousness, subsequent encounter: Secondary | ICD-10-CM | POA: Diagnosis not present

## 2015-07-24 DIAGNOSIS — I120 Hypertensive chronic kidney disease with stage 5 chronic kidney disease or end stage renal disease: Secondary | ICD-10-CM | POA: Diagnosis not present

## 2015-07-24 DIAGNOSIS — E1122 Type 2 diabetes mellitus with diabetic chronic kidney disease: Secondary | ICD-10-CM | POA: Diagnosis not present

## 2015-07-24 DIAGNOSIS — Z992 Dependence on renal dialysis: Secondary | ICD-10-CM | POA: Diagnosis not present

## 2015-07-24 DIAGNOSIS — F0390 Unspecified dementia without behavioral disturbance: Secondary | ICD-10-CM | POA: Diagnosis not present

## 2015-07-26 ENCOUNTER — Encounter (HOSPITAL_COMMUNITY): Payer: Self-pay | Admitting: Emergency Medicine

## 2015-07-26 ENCOUNTER — Emergency Department (HOSPITAL_COMMUNITY): Payer: Medicare Other

## 2015-07-26 ENCOUNTER — Inpatient Hospital Stay (HOSPITAL_COMMUNITY)
Admission: EM | Admit: 2015-07-26 | Discharge: 2015-07-29 | DRG: 100 | Disposition: A | Discharge status: Home Health | Payer: Medicare Other | Attending: Internal Medicine | Admitting: Internal Medicine

## 2015-07-26 DIAGNOSIS — Q2733 Arteriovenous malformation of digestive system vessel: Secondary | ICD-10-CM

## 2015-07-26 DIAGNOSIS — T82590A Other mechanical complication of surgically created arteriovenous fistula, initial encounter: Secondary | ICD-10-CM

## 2015-07-26 DIAGNOSIS — S065X9D Traumatic subdural hemorrhage with loss of consciousness of unspecified duration, subsequent encounter: Secondary | ICD-10-CM | POA: Diagnosis not present

## 2015-07-26 DIAGNOSIS — R451 Restlessness and agitation: Secondary | ICD-10-CM | POA: Diagnosis not present

## 2015-07-26 DIAGNOSIS — Z79899 Other long term (current) drug therapy: Secondary | ICD-10-CM

## 2015-07-26 DIAGNOSIS — I120 Hypertensive chronic kidney disease with stage 5 chronic kidney disease or end stage renal disease: Secondary | ICD-10-CM | POA: Diagnosis not present

## 2015-07-26 DIAGNOSIS — D631 Anemia in chronic kidney disease: Secondary | ICD-10-CM | POA: Diagnosis present

## 2015-07-26 DIAGNOSIS — Z981 Arthrodesis status: Secondary | ICD-10-CM

## 2015-07-26 DIAGNOSIS — Z992 Dependence on renal dialysis: Secondary | ICD-10-CM

## 2015-07-26 DIAGNOSIS — K5521 Angiodysplasia of colon with hemorrhage: Secondary | ICD-10-CM

## 2015-07-26 DIAGNOSIS — W19XXXD Unspecified fall, subsequent encounter: Secondary | ICD-10-CM | POA: Diagnosis present

## 2015-07-26 DIAGNOSIS — R569 Unspecified convulsions: Secondary | ICD-10-CM | POA: Diagnosis not present

## 2015-07-26 DIAGNOSIS — E1142 Type 2 diabetes mellitus with diabetic polyneuropathy: Secondary | ICD-10-CM | POA: Diagnosis not present

## 2015-07-26 DIAGNOSIS — R269 Unspecified abnormalities of gait and mobility: Secondary | ICD-10-CM | POA: Diagnosis not present

## 2015-07-26 DIAGNOSIS — S065X9A Traumatic subdural hemorrhage with loss of consciousness of unspecified duration, initial encounter: Secondary | ICD-10-CM

## 2015-07-26 DIAGNOSIS — I6200 Nontraumatic subdural hemorrhage, unspecified: Secondary | ICD-10-CM | POA: Diagnosis not present

## 2015-07-26 DIAGNOSIS — G40909 Epilepsy, unspecified, not intractable, without status epilepticus: Principal | ICD-10-CM | POA: Diagnosis present

## 2015-07-26 DIAGNOSIS — E1122 Type 2 diabetes mellitus with diabetic chronic kidney disease: Secondary | ICD-10-CM | POA: Diagnosis present

## 2015-07-26 DIAGNOSIS — I10 Essential (primary) hypertension: Secondary | ICD-10-CM

## 2015-07-26 DIAGNOSIS — Z794 Long term (current) use of insulin: Secondary | ICD-10-CM

## 2015-07-26 DIAGNOSIS — D509 Iron deficiency anemia, unspecified: Secondary | ICD-10-CM | POA: Diagnosis not present

## 2015-07-26 DIAGNOSIS — N186 End stage renal disease: Secondary | ICD-10-CM | POA: Diagnosis not present

## 2015-07-26 DIAGNOSIS — R4789 Other speech disturbances: Secondary | ICD-10-CM | POA: Diagnosis present

## 2015-07-26 DIAGNOSIS — Z886 Allergy status to analgesic agent status: Secondary | ICD-10-CM | POA: Diagnosis not present

## 2015-07-26 DIAGNOSIS — I6201 Nontraumatic acute subdural hemorrhage: Secondary | ICD-10-CM | POA: Diagnosis not present

## 2015-07-26 DIAGNOSIS — N189 Chronic kidney disease, unspecified: Secondary | ICD-10-CM | POA: Diagnosis not present

## 2015-07-26 DIAGNOSIS — W19XXXS Unspecified fall, sequela: Secondary | ICD-10-CM

## 2015-07-26 DIAGNOSIS — G40319 Generalized idiopathic epilepsy and epileptic syndromes, intractable, without status epilepticus: Secondary | ICD-10-CM | POA: Diagnosis not present

## 2015-07-26 DIAGNOSIS — Z91018 Allergy to other foods: Secondary | ICD-10-CM | POA: Diagnosis not present

## 2015-07-26 DIAGNOSIS — D689 Coagulation defect, unspecified: Secondary | ICD-10-CM | POA: Diagnosis not present

## 2015-07-26 DIAGNOSIS — S065XAA Traumatic subdural hemorrhage with loss of consciousness status unknown, initial encounter: Secondary | ICD-10-CM

## 2015-07-26 DIAGNOSIS — I6203 Nontraumatic chronic subdural hemorrhage: Secondary | ICD-10-CM | POA: Diagnosis not present

## 2015-07-26 DIAGNOSIS — D696 Thrombocytopenia, unspecified: Secondary | ICD-10-CM | POA: Diagnosis present

## 2015-07-26 DIAGNOSIS — S065X0A Traumatic subdural hemorrhage without loss of consciousness, initial encounter: Secondary | ICD-10-CM | POA: Diagnosis not present

## 2015-07-26 DIAGNOSIS — Z4901 Encounter for fitting and adjustment of extracorporeal dialysis catheter: Secondary | ICD-10-CM | POA: Diagnosis not present

## 2015-07-26 LAB — CBC WITH DIFFERENTIAL/PLATELET
BASOS ABS: 0 10*3/uL (ref 0.0–0.1)
BASOS PCT: 0 %
EOS ABS: 0.1 10*3/uL (ref 0.0–0.7)
Eosinophils Relative: 2 %
HEMATOCRIT: 40 % (ref 39.0–52.0)
HEMOGLOBIN: 12.9 g/dL — AB (ref 13.0–17.0)
Lymphocytes Relative: 28 %
Lymphs Abs: 1 10*3/uL (ref 0.7–4.0)
MCH: 33 pg (ref 26.0–34.0)
MCHC: 32.3 g/dL (ref 30.0–36.0)
MCV: 102.3 fL — ABNORMAL HIGH (ref 78.0–100.0)
Monocytes Absolute: 0.3 10*3/uL (ref 0.1–1.0)
Monocytes Relative: 8 %
NEUTROS ABS: 2.3 10*3/uL (ref 1.7–7.7)
NEUTROS PCT: 62 %
Platelets: 107 10*3/uL — ABNORMAL LOW (ref 150–400)
RBC: 3.91 MIL/uL — AB (ref 4.22–5.81)
RDW: 17.4 % — ABNORMAL HIGH (ref 11.5–15.5)
WBC: 3.7 10*3/uL — AB (ref 4.0–10.5)

## 2015-07-26 LAB — BASIC METABOLIC PANEL
ANION GAP: 12 (ref 5–15)
BUN: 75 mg/dL — ABNORMAL HIGH (ref 6–20)
CALCIUM: 9 mg/dL (ref 8.9–10.3)
CHLORIDE: 98 mmol/L — AB (ref 101–111)
CO2: 24 mmol/L (ref 22–32)
CREATININE: 8.37 mg/dL — AB (ref 0.61–1.24)
GFR calc non Af Amer: 5 mL/min — ABNORMAL LOW (ref >60)
GFR, EST AFRICAN AMERICAN: 6 mL/min — AB (ref >60)
Glucose, Bld: 149 mg/dL — ABNORMAL HIGH (ref 65–99)
Potassium: 4.8 mmol/L (ref 3.5–5.1)
SODIUM: 134 mmol/L — AB (ref 135–145)

## 2015-07-26 LAB — APTT: aPTT: 30 seconds (ref 24–37)

## 2015-07-26 LAB — MAGNESIUM: MAGNESIUM: 3.3 mg/dL — AB (ref 1.7–2.4)

## 2015-07-26 LAB — PROTIME-INR
INR: 1.08 (ref 0.00–1.49)
PROTHROMBIN TIME: 14.2 s (ref 11.6–15.2)

## 2015-07-26 LAB — ETHANOL

## 2015-07-26 MED ORDER — CALCITRIOL 0.5 MCG PO CAPS: 1.0000 ug | ORAL_CAPSULE | ORAL | Status: DC

## 2015-07-26 MED ORDER — GABAPENTIN 100 MG PO CAPS
100.0000 mg | ORAL_CAPSULE | Freq: Every day | ORAL | Status: DC
  Administered 2015-07-26 – 2015-07-29 (×4): 100 mg via ORAL
  Filled 2015-07-26 (×4): qty 1

## 2015-07-26 MED ORDER — CALCIUM ACETATE 667 MG PO CAPS: 2001.0000 mg | ORAL_CAPSULE | Freq: Three times a day (TID) | ORAL | Status: DC

## 2015-07-26 MED ORDER — LACOSAMIDE 50 MG PO TABS
50.0000 mg | ORAL_TABLET | Freq: Once | ORAL | Status: AC
  Administered 2015-07-26: 50 mg via ORAL
  Filled 2015-07-26: qty 1

## 2015-07-26 MED ORDER — FOLIC ACID 1 MG PO TABS
1.0000 mg | ORAL_TABLET | Freq: Every day | ORAL | Status: DC
  Administered 2015-07-26 – 2015-07-28 (×3): 1 mg via ORAL
  Filled 2015-07-26 (×4): qty 1

## 2015-07-26 MED ORDER — CALCIUM ACETATE (PHOS BINDER) 667 MG PO CAPS
2001.0000 mg | ORAL_CAPSULE | Freq: Three times a day (TID) | ORAL | Status: DC
  Administered 2015-07-26 – 2015-07-28 (×7): 2001 mg via ORAL
  Filled 2015-07-26 (×11): qty 3

## 2015-07-26 MED ORDER — AMLODIPINE BESYLATE 10 MG PO TABS: 10.0000 mg | ORAL_TABLET | Freq: Every day | ORAL | Status: DC

## 2015-07-26 MED ORDER — CINACALCET HCL 30 MG PO TABS
60.0000 mg | ORAL_TABLET | Freq: Every day | ORAL | Status: DC
  Administered 2015-07-26 – 2015-07-28 (×3): 60 mg via ORAL
  Filled 2015-07-26 (×4): qty 2

## 2015-07-26 MED ORDER — ACETAMINOPHEN 650 MG RE SUPP: 650.0000 mg | RECTAL | Status: DC | PRN

## 2015-07-26 MED ORDER — STROKE: EARLY STAGES OF RECOVERY BOOK
Freq: Once | Status: AC
  Administered 2015-07-26: 20:00:00
  Filled 2015-07-26: qty 1

## 2015-07-26 MED ORDER — TRIAMCINOLONE ACETONIDE 55 MCG/ACT NA AERO: 1.0000 | INHALATION_SPRAY | Freq: Every day | NASAL | Status: DC

## 2015-07-26 MED ORDER — ACETAMINOPHEN 325 MG PO TABS: 650.0000 mg | ORAL_TABLET | ORAL | Status: DC | PRN

## 2015-07-26 MED ORDER — CALCIUM ACETATE (PHOS BINDER) 667 MG PO CAPS
2001.0000 mg | ORAL_CAPSULE | Freq: Three times a day (TID) | ORAL | Status: DC
  Filled 2015-07-26: qty 3

## 2015-07-26 MED ORDER — LACOSAMIDE 50 MG PO TABS
100.0000 mg | ORAL_TABLET | Freq: Two times a day (BID) | ORAL | Status: DC
  Administered 2015-07-26 – 2015-07-29 (×6): 100 mg via ORAL
  Filled 2015-07-26 (×6): qty 2

## 2015-07-26 MED ORDER — LACOSAMIDE 50 MG PO TABS: 50.0000 mg | ORAL_TABLET | ORAL | Status: DC

## 2015-07-26 MED ORDER — PANTOPRAZOLE SODIUM 40 MG IV SOLR
40.0000 mg | Freq: Every day | INTRAVENOUS | Status: DC
  Administered 2015-07-26: 40 mg via INTRAVENOUS
  Filled 2015-07-26: qty 40

## 2015-07-26 MED ORDER — CYCLOBENZAPRINE HCL 10 MG PO TABS
5.0000 mg | ORAL_TABLET | Freq: Three times a day (TID) | ORAL | Status: DC | PRN
  Administered 2015-07-27: 5 mg via ORAL
  Filled 2015-07-26: qty 1

## 2015-07-26 MED ORDER — LORAZEPAM 2 MG/ML IJ SOLN: 1.0000 mg | INTRAMUSCULAR | Status: DC | PRN

## 2015-07-26 MED ORDER — AMLODIPINE BESYLATE 10 MG PO TABS
10.0000 mg | ORAL_TABLET | Freq: Every day | ORAL | Status: DC
  Administered 2015-07-27: 10 mg via ORAL
  Filled 2015-07-26 (×2): qty 1

## 2015-07-26 MED ORDER — LABETALOL HCL 5 MG/ML IV SOLN: 10.0000 mg | INTRAVENOUS | Status: DC | PRN

## 2015-07-26 MED ORDER — SENNOSIDES-DOCUSATE SODIUM 8.6-50 MG PO TABS
1.0000 | ORAL_TABLET | Freq: Two times a day (BID) | ORAL | Status: DC
  Administered 2015-07-26 – 2015-07-28 (×5): 1 via ORAL
  Filled 2015-07-26 (×6): qty 1

## 2015-07-26 MED ORDER — DIVALPROEX SODIUM 250 MG PO DR TAB
250.0000 mg | DELAYED_RELEASE_TABLET | Freq: Two times a day (BID) | ORAL | Status: DC
  Administered 2015-07-26 – 2015-07-29 (×6): 250 mg via ORAL
  Filled 2015-07-26 (×7): qty 1

## 2015-07-26 NOTE — ED Provider Notes (Signed)
CSN: [id number]     Arrival date & time 07/26/15  1105 History   First MD Initiated Contact with Patient 07/26/15 1110     Chief Complaint  Patient presents with  . Seizures     (Consider location/radiation/quality/duration/timing/severity/associated sxs/prior Treatment) Patient is a 81 y.o. male presenting with seizures. The history is provided by the patient, the spouse and a relative.  Seizures  patient is a dialysis patient normally dialyzed Monday Wednesdays and Fridays but missed Friday so was dialyzed today. 3 hours into dialysis had a seizure. Patient has a history of seizures he is treated with vimpat. Patient does a kayak occasionally has seizures. The seizures when they occur he has confusion and drowsiness for several hours. Upon presentation he did have some of that present but he did recover after an hour or 2. Patient however though was alert even upon presentation and would follow commands. Patient denies any of pain. Denies any headache chest pain shortness of breath abdominal pain. [name] states that patient did have a couple falls in the last week or so and that he was admitted in September for subdural hematoma falls. Son is concerned that there may have been some injuries from those falls. Down   Past Medical History  Diagnosis Date  . Hypertension   . Diabetes mellitus without complication (HCC)   . Arthritis   . History of blood transfusion   . GI bleed   . ESRD (end stage renal disease) (HCC)   . Seizure (HCC)   . Dialysis patient (HCC)   . Subdural hematoma (HCC) 05/2015    from fall   Past Surgical History  Procedure Laterality Date  . Esophagogastroduodenoscopy  10/03/2012    Procedure: ESOPHAGOGASTRODUODENOSCOPY (EGD);  Surgeon: Theda Belfast, MD;  Location: [facility] ENDOSCOPY;  Service: Endoscopy;  Laterality: N/A;  . Colonoscopy  10/04/2012    Procedure: COLONOSCOPY;  Surgeon: Theda Belfast, MD;  Location: [facility] ENDOSCOPY;  Service: Endoscopy;  Laterality:  N/A;  . Colonoscopy with esophagogastroduodenoscopy (egd) Left 11/30/2012    Procedure: COLONOSCOPY WITH ESOPHAGOGASTRODUODENOSCOPY (EGD);  Surgeon: Theda Belfast, MD;  Location: [facility] ENDOSCOPY;  Service: Endoscopy;  Laterality: Left;  . Spine surgery  2004    back fusion/ [facility] Penumalli,MD  . Retinal laser procedure Right 04/2015   Family History  Problem Relation Age of Onset  . Sudden death Mother   . Sudden death Father    Social History  Substance Use Topics  . Smoking status: Never Smoker   . Smokeless tobacco: Never Used  . Alcohol Use: No    Review of Systems  Constitutional: Negative for fever.  HENT: Negative for congestion.   Eyes: Negative for visual disturbance.  Respiratory: Negative for shortness of breath.   Cardiovascular: Negative for chest pain.  Gastrointestinal: Negative for nausea and vomiting.  Genitourinary: Negative for dysuria.  Skin: Negative for rash.  Neurological: Positive for seizures. Negative for headaches.  Hematological: Bruises/bleeds easily.  Psychiatric/Behavioral: Positive for confusion.      Allergies  Aspirin and Lactose intolerance (gi)  Home Medications   Prior to Admission medications   Medication Sig Start Date End Date Taking? Authorizing Provider  amLODipine (NORVASC) 10 MG tablet Take 10 mg by mouth Daily.  08/15/12  Yes Historical Provider, MD  calcitRIOL (ROCALTROL) 0.5 MCG capsule Take 2 capsules (1 mcg total) by mouth every Monday, Wednesday, and Friday with hemodialysis. 05/23/15  Yes Lonia Blood, MD  calcium acetate (PHOSLO) 667 MG capsule Take 3  capsules by mouth three times a day 07/15/15  Yes Sharon Seller, NP  cinacalcet (SENSIPAR) 30 MG tablet Take 2 tablets (60 mg total) by mouth daily with supper. 06/22/15  Yes Catarina Hartshorn, MD  colchicine 0.6 MG tablet Take 0.6 mg by mouth as needed.  02/14/14  Yes Historical Provider, MD  cyclobenzaprine (FLEXERIL) 5 MG tablet Take one tablet by mouth once daily as needed  for spasms 07/23/15  Yes [name] L [name], DO  fexofenadine (ALLEGRA) 180 MG tablet Take 180 mg by mouth daily. As needed   Yes Historical Provider, MD  folic acid (FOLVITE) 1 MG tablet Take 1 tablet (1 mg total) by mouth daily. 09/29/13  Yes Joseph Art, DO  gabapentin (NEURONTIN) 100 MG capsule Take 1 capsule (100 mg total) by mouth daily. 05/23/15  Yes Lonia Blood, MD  glucose blood (ACCU-CHEK AVIVA PLUS) test strip Check blood sugar three times daily 07/21/15  Yes [name] L [name], DO  HUMALOG KWIKPEN 100 UNIT/ML KiwkPen Sliding Scale 04/16/15  Yes Historical Provider, MD  Lacosamide 100 MG TABS Take 1 tablet (100 mg total) by mouth 2 (two) times daily. 07/21/15  Yes [name] L [name], DO  multivitamin (RENA-VIT) TABS tablet Take 1 tablet by mouth at bedtime. 09/29/13  Yes Jessica U Vann, DO  senna-docusate (SENOKOT-S) 8.6-50 MG per tablet Take 1 tablet by mouth. As needed   Yes Historical Provider, MD  triamcinolone (NASACORT ALLERGY 24HR) 55 MCG/ACT AERO nasal inhaler Place 1 spray into the nose. One spray nasal as needed for allery   Yes Historical Provider, MD  BESIVANCE 0.6 % SUSP Place 1 drop into the right eye 4 (four) times daily as needed. For 2 days after each monthly injection 05/08/15   Historical Provider, MD  darbepoetin (ARANESP) 60 MCG/0.3ML SOLN injection Inject 0.3 mLs (60 mcg total) into the vein every Thursday with hemodialysis. Patient taking differently: Inject 60 mcg into the vein every 7 (seven) days.  09/29/13   Joseph Art, DO  sodium chloride 0.9 % SOLN 100 mL with ferric gluconate 12.5 MG/ML SOLN 62.5 mg Inject 62.5 mg into the vein every Thursday with hemodialysis. 09/29/13   Jessica U Vann, DO   BP 146/75 mmHg  Pulse 67  Temp(Src) 98 F (36.7 C) (Oral)  Resp 12  SpO2 100% Physical Exam  Constitutional: He appears well-developed and well-nourished. No distress.  HENT:  Head: Normocephalic and atraumatic.  Mouth/Throat: Oropharynx is clear and moist.  Eyes:  Conjunctivae and EOM are normal. Pupils are equal, round, and reactive to light.  Neck: Normal range of motion. Neck supple.  Cardiovascular: Normal rate, regular rhythm and normal heart sounds.   No murmur heard. Pulmonary/Chest: Effort normal and breath sounds normal. No respiratory distress.  Abdominal: Soft. Bowel sounds are normal. There is no tenderness.  Musculoskeletal:  Right arm with dialysis temporary catheter still in place good thrill radial pulse intact distally. No active bleeding.  Neurological: He is alert. No cranial nerve deficit. He exhibits normal muscle tone. Coordination normal.  Skin: Skin is warm.  Nursing note and vitals reviewed.   ED Course  Procedures (including critical care time) Labs Review Labs Reviewed  BASIC METABOLIC PANEL - Abnormal; Notable for the following:    Sodium 134 (*)    Chloride 98 (*)    Glucose, Bld 149 (*)    BUN 75 (*)    Creatinine, Ser 8.37 (*)    GFR calc non Af Amer 5 (*)  GFR calc Af Amer 6 (*)    All other components within normal limits  CBC WITH DIFFERENTIAL/PLATELET - Abnormal; Notable for the following:    WBC 3.7 (*)    RBC 3.91 (*)    Hemoglobin 12.9 (*)    MCV 102.3 (*)    RDW 17.4 (*)    Platelets 107 (*)    All other components within normal limits  MAGNESIUM - Abnormal; Notable for the following:    Magnesium 3.3 (*)    All other components within normal limits   Results for orders placed or performed during the hospital encounter of 07/26/15  Basic metabolic panel  Result Value Ref Range   Sodium 134 (L) 135 - 145 mmol/L   Potassium 4.8 3.5 - 5.1 mmol/L   Chloride 98 (L) 101 - 111 mmol/L   CO2 24 22 - 32 mmol/L   Glucose, Bld 149 (H) 65 - 99 mg/dL   BUN 75 (H) 6 - 20 mg/dL   Creatinine, Ser 0.24 (H) 0.61 - 1.24 mg/dL   Calcium 9.0 8.9 - 09.7 mg/dL   GFR calc non Af Amer 5 (L) >60 mL/min   GFR calc Af Amer 6 (L) >60 mL/min   Anion gap 12 5 - 15  CBC with Differential/Platelet  Result Value Ref  Range   WBC 3.7 (L) 4.0 - 10.5 K/uL   RBC 3.91 (L) 4.22 - 5.81 MIL/uL   Hemoglobin 12.9 (L) 13.0 - 17.0 g/dL   HCT 35.3 29.9 - 24.2 %   MCV 102.3 (H) 78.0 - 100.0 fL   MCH 33.0 26.0 - 34.0 pg   MCHC 32.3 30.0 - 36.0 g/dL   RDW 68.3 (H) 41.9 - 62.2 %   Platelets 107 (L) 150 - 400 K/uL   Neutrophils Relative % 62 %   Neutro Abs 2.3 1.7 - 7.7 K/uL   Lymphocytes Relative 28 %   Lymphs Abs 1.0 0.7 - 4.0 K/uL   Monocytes Relative 8 %   Monocytes Absolute 0.3 0.1 - 1.0 K/uL   Eosinophils Relative 2 %   Eosinophils Absolute 0.1 0.0 - 0.7 K/uL   Basophils Relative 0 %   Basophils Absolute 0.0 0.0 - 0.1 K/uL  Magnesium  Result Value Ref Range   Magnesium 3.3 (H) 1.7 - 2.4 mg/dL     Imaging Review Ct Head Wo Contrast  07/26/2015  CLINICAL DATA:  Patient has a history of seizure, while at dialysis patient had a seizure today. Patient told staff he had a seizure yesterday, he altered and confused EXAM: CT HEAD WITHOUT CONTRAST TECHNIQUE: Contiguous axial images were obtained from the base of the skull through the vertex without intravenous contrast. COMPARISON:  Head CT, 05/21/2015 and brain MRI, 06/21/2015. FINDINGS: Left-sided subdural hemorrhage appears slightly larger than it did on the prior MRI and head CT, measuring 1 cm in greatest thickness over the left parietal lobe. There is mild effacement of the underlying sulci and flattening of gyri, particularly over the left parietal lobe. There is no significant midline shift. No other intracranial hemorrhage. The ventricles are normal configuration. There is age related ventricular and sulcal enlargement. No hydrocephalus. There are no parenchymal masses. There is no evidence of a cortical infarct. Patchy white matter hypoattenuation is noted consistent with moderate to advanced chronic microvascular ischemic change. There are no extra-axial masses. Minimal dependent fluid or mucosal thickening is seen in the right maxillary sinus. Remaining  sinuses are clear as are the mastoid air cells. IMPRESSION: 1.  Slight increase in the size of the left subdural hematoma. This is consistent with a small amount of acute hemorrhage superimposed on a chronic subdural hemorrhage. Mild mass effect noted without midline shift. 2. No other acute findings.  No evidence of an ischemic infarct. 3. Age related volume loss and moderate to advanced chronic microvascular ischemic change. Electronically Signed   By: Amie Portland M.D.   On: 07/26/2015 12:48   Dg Chest Port 1 View  07/26/2015  CLINICAL DATA:  Seizure like activity during dialysis. Patient is confused and in a postictal state. EXAM: PORTABLE CHEST 1 VIEW COMPARISON:  10/01/2013 FINDINGS: The heart size and mediastinal contours are within normal limits. Lung volumes are low. Both lungs are clear. The visualized skeletal structures are unremarkable. IMPRESSION: 1. No acute findings. 2. Low lung volumes. Electronically Signed   By: Signa Kell M.D.   On: 07/26/2015 12:12   I have personally reviewed and evaluated these images and lab results as part of my medical decision-making.   EKG Interpretation   Date/Time:  Saturday July 26 2015 11:17:24 EST Ventricular Rate:  74 PR Interval:  210 QRS Duration: 89 QT Interval:  404 QTC Calculation: 448 R Axis:   40 Text Interpretation:  Sinus rhythm Probable left atrial enlargement  Abnormal R-wave progression, early transition Nonspecific T abnormalities,  lateral leads Confirmed by Trinda Harlacher  MD, Ezriel Boffa ([id number]) on 07/26/2015  11:34:20 AM      MDM   Final diagnoses:  Fall, sequela  Subdural hematoma (HCC)  Seizure (HCC)   Patient sent in from dialysis for having a seizure. Patient has a history of seizures. Patient normally dialyzed on Monday Wednesdays and Fridays but he missed Friday so he was dialyzed today. About 3 hours into dialysis the typical generalized seizure occurred for him patients known to have a post ictal phase of being  very somnolent for several hours after that. Patient is now recovered appropriately seems to be almost completely back to baseline. [name] notified us that his had a couple falls in the last week or so and was concerned about hitting his head. Patient was admitted in September for a subdural hematoma.  From a dialysis standpoint patient appears to have been adequately dialyzed electrolytes are x-ray shows no fluid overload.  From the fall standpoint head CT showed evidence of an increase the subdural hematoma same location as before but this time showing an enlargement in a little bit of a shift. Patient without any significant or obvious neuro focal deficits on my initial exam.  Contacted the neurosurgery Dr. Jeral Fruit who reviewed the head CT is recommending admission and observation. Contacted hospitalist service for admission. Patient will be admitted and observed will have repeat head CT in the 2-3 days.      Vanetta Mulders, MD 07/26/15 [phone]

## 2015-07-26 NOTE — Consult Note (Signed)
[name], [name]                ACCOUNT NO.:  [id number]  MEDICAL RECORD NO.:  [id number]  LOCATION:  5M10C                        FACILITY:  [facility]  PHYSICIAN:  Hilda Lias, M.D.   DATE OF BIRTH:  Sep 25, 1931  DATE OF CONSULTATION:  07/26/2015 DATE OF DISCHARGE:                                CONSULTATION   HISTORY OF PRESENT ILLNESS:  [name] is a gentleman, who had hemodialysis today and soon after he developed a seizure disorder.  He was given some medication.  Nevertheless, he was confused soon after. Because of the history, he was sent to [facility] Emergency Room.  This patient 2 months ago was admitted with a subdural hematoma in the left side.  PAST MEDICAL HISTORY:  He has a history of hypertension and diabetes. He has a history of GI bleed.  He is taking medication for seizure.  PAST SURGICAL HISTORY:  He had a back fusion in the past, he had eye surgery, colonoscopy.  REVIEW OF SYSTEMS:  Positive for seizure.  He denies any headache.  PHYSICAL EXAMINATION:  GENERAL:  Clinically, the patient right now is awake, talking, moving all the 4 extremities. NEUROLOGIC:  The cranial nerves are normal.  He has no stiffness of the neck.  I reviewed the CT scan and indeed there is a chronic with acute subdural hematoma in the left frontoparietal area with minimal shift.  RECOMMENDATION:  The patient is going to be admitted to the hospital for observation.  At the present time, there is no need to do any surgery and hopefully, he will not.  He is going to have a CT scan repeated in the next 24 to 48 hours.  The goal for him is to get better without surgery.          ______________________________ Hilda Lias, M.D.     EB/MEDQ  D:  07/26/2015  T:  07/26/2015  Job:  [id number]

## 2015-07-26 NOTE — H&P (Signed)
[facility] History and Physical  Patient: [name]  MRN: [id number]  DOB: September 15, 1931  DOS: the patient was seen and examined on 07/26/2015 PCP: [name], DO  Referring physician: Dr. Herma Carson Chief Complaint: Seizure  HPI: [name] is a 81 y.o. male with Past medical history of hypertension, diabetes mellitus, recurrent fall, recent subdural hematoma leading to seizure on Vimpat, ESRD on hemodialysis Tuesday Thursday Saturday. Patient presents with complaints of seizure episode. Next and patient was at the dialysis center while on dialysis is started having episodes of seizure. The patient does not remember any information after arriving at the dialysis center. At the time of my evaluation patient denies any complaints of headache dizziness lightheadedness vision changes speech difficulty chest and abdominal pain nausea vomiting diarrhea or focal deficit. Next and patient mentions he is compliant with all his medication. Family reports patient had a recent falls but does not know when. Patient did not have any episodes of seizures since his recent hospitalization. No diarrhea or constipation. No recent change in medications reported.  The patient is coming from home.  At his baseline ambulates with support And is independent for most of his ADL; manages his medication on his own.  Review of Systems: as mentioned in the history of present illness.  A comprehensive review of the other systems is negative.  Past Medical History  Diagnosis Date  . Hypertension   . Diabetes mellitus without complication (HCC)   . Arthritis   . History of blood transfusion   . GI bleed   . ESRD (end stage renal disease) (HCC)   . Seizure (HCC)   . Dialysis patient (HCC)   . Subdural hematoma (HCC) 05/2015    from fall   Past Surgical History  Procedure Laterality Date  . Esophagogastroduodenoscopy  10/03/2012    Procedure: ESOPHAGOGASTRODUODENOSCOPY (EGD);  Surgeon: Theda Belfast, MD;   Location: [facility] ENDOSCOPY;  Service: Endoscopy;  Laterality: N/A;  . Colonoscopy  10/04/2012    Procedure: COLONOSCOPY;  Surgeon: Theda Belfast, MD;  Location[facility] ENDOSCOPY;  Service: Endoscopy;  Laterality: N/A;  . Colonoscopy with esophagogastroduodenoscopy (egd) Left 11/30/2012    Procedure: COLONOSCOPY WITH ESOPHAGOGASTRODUODENOSCOPY (EGD);  Surgeon: Theda Belfast, MD;  Location: [facility] ENDOSCOPY;  Service: Endoscopy;  Laterality: Left;  . Spine surgery  2004    back fusion/ [facility] Penumalli,MD  . Retinal laser procedure Right 04/2015   Social History:  reports that he has never smoked. He has never used smokeless tobacco. He reports that he does not drink alcohol or use illicit drugs.  Allergies  Allergen Reactions  . Aspirin Other (See Comments)    Bleeding   . Lactose Intolerance (Gi) Diarrhea    Family History  Problem Relation Age of Onset  . Sudden death Mother   . Sudden death Father     Prior to Admission medications   Medication Sig Start Date End Date Taking? Authorizing Provider  amLODipine (NORVASC) 10 MG tablet Take 10 mg by mouth Daily.  08/15/12  Yes Historical Provider, MD  calcitRIOL (ROCALTROL) 0.5 MCG capsule Take 2 capsules (1 mcg total) by mouth every Monday, Wednesday, and Friday with hemodialysis. 05/23/15  Yes Lonia Blood, MD  calcium acetate (PHOSLO) 667 MG capsule Take 3 capsules by mouth three times a day 07/15/15  Yes Sharon Seller, NP  cinacalcet (SENSIPAR) 30 MG tablet Take 2 tablets (60 mg total) by mouth daily with supper. 06/22/15  Yes Catarina Hartshorn, MD  colchicine 0.6 MG  tablet Take 0.6 mg by mouth as needed.  02/14/14  Yes Historical Provider, MD  cyclobenzaprine (FLEXERIL) 5 MG tablet Take one tablet by mouth once daily as needed for spasms 07/23/15  Yes [name] L [name], DO  fexofenadine (ALLEGRA) 180 MG tablet Take 180 mg by mouth daily. As needed   Yes Historical Provider, MD  folic acid (FOLVITE) 1 MG tablet Take 1 tablet (1 mg total) by mouth  daily. 09/29/13  Yes Joseph Art, DO  gabapentin (NEURONTIN) 100 MG capsule Take 1 capsule (100 mg total) by mouth daily. 05/23/15  Yes Lonia Blood, MD  glucose blood (ACCU-CHEK AVIVA PLUS) test strip Check blood sugar three times daily 07/21/15  Yes [name] L [name], DO  HUMALOG KWIKPEN 100 UNIT/ML KiwkPen Sliding Scale 04/16/15  Yes Historical Provider, MD  Lacosamide 100 MG TABS Take 1 tablet (100 mg total) by mouth 2 (two) times daily. 07/21/15  Yes [name] L [name], DO  multivitamin (RENA-VIT) TABS tablet Take 1 tablet by mouth at bedtime. 09/29/13  Yes Jessica U Vann, DO  senna-docusate (SENOKOT-S) 8.6-50 MG per tablet Take 1 tablet by mouth. As needed   Yes Historical Provider, MD  triamcinolone (NASACORT ALLERGY 24HR) 55 MCG/ACT AERO nasal inhaler Place 1 spray into the nose. One spray nasal as needed for allery   Yes Historical Provider, MD  BESIVANCE 0.6 % SUSP Place 1 drop into the right eye 4 (four) times daily as needed. For 2 days after each monthly injection 05/08/15   Historical Provider, MD  darbepoetin (ARANESP) 60 MCG/0.3ML SOLN injection Inject 0.3 mLs (60 mcg total) into the vein every Thursday with hemodialysis. Patient taking differently: Inject 60 mcg into the vein every 7 (seven) days.  09/29/13   Joseph Art, DO  sodium chloride 0.9 % SOLN 100 mL with ferric gluconate 12.5 MG/ML SOLN 62.5 mg Inject 62.5 mg into the vein every Thursday with hemodialysis. 09/29/13   Joseph Art, DO    Physical Exam: Filed Vitals:   07/26/15 1445 07/26/15 1500 07/26/15 1530 07/26/15 1545  BP: 155/81 146/75 146/81 158/82  Pulse: 69 67 53 75  Temp:      TempSrc:      Resp: 20 12 15 12   SpO2: 100% 100% 100% 100%    General: Alert, Awake and Oriented to Time, Place and Person. Appear in mild distress Eyes: PERRL ENT: Oral Mucosa clear moist. Neck: no JVD Cardiovascular: S1 and S2 Present, no Murmur, Peripheral Pulses Present Respiratory: Bilateral Air entry equal and Decreased,    Clear to Auscultation, no Crackles, no wheezes Abdomen: Bowel Sound present, Soft and no tenderness Skin: no Rash Extremities: no Pedal edema, no calf tenderness Neurologic: Mental status AAOx3, speech normal, attention normal, cannot remember recent events. Cranial Nerves PERRL, EOM normal and present, facial sensation to light touch present,  Motor strength bilateral equal strength 5/5,  Sensation present to light touch,  Reflexes present knee and biceps, babinski negative ,  Cerebellar test normal finger nose finger.  Labs on Admission:  CBC:  Recent Labs Lab 07/26/15 1210  WBC 3.7*  NEUTROABS 2.3  HGB 12.9*  HCT 40.0  MCV 102.3*  PLT 107*    CMP     Component Value Date/Time   NA 134* 07/26/2015 1210   NA 141 05/13/2015 0925   K 4.8 07/26/2015 1210   CL 98* 07/26/2015 1210   CO2 24 07/26/2015 1210   GLUCOSE 149* 07/26/2015 1210   GLUCOSE 127* 05/13/2015 07/13/2015  BUN 75* 07/26/2015 1210   BUN 56* 05/13/2015 0925   CREATININE 8.37* 07/26/2015 1210   CALCIUM 9.0 07/26/2015 1210   PROT 7.1 06/20/2015 2318   PROT 7.1 08/15/2014 1059   ALBUMIN 3.5 06/23/2015 1023   ALBUMIN 4.5 08/15/2014 1059   AST 19 06/20/2015 2318   ALT 15* 06/20/2015 2318   ALKPHOS 43 06/20/2015 2318   BILITOT 0.7 06/20/2015 2318   GFRNONAA 5* 07/26/2015 1210   GFRAA 6* 07/26/2015 1210    No results for input(s): CKTOTAL, CKMB, CKMBINDEX, TROPONINI in the last 168 hours. BNP (last 3 results) No results for input(s): BNP in the last 8760 hours.  ProBNP (last 3 results) No results for input(s): PROBNP in the last 8760 hours.   Radiological Exams on Admission: Ct Head Wo Contrast  07/26/2015  CLINICAL DATA:  Patient has a history of seizure, while at dialysis patient had a seizure today. Patient told staff he had a seizure yesterday, he altered and confused EXAM: CT HEAD WITHOUT CONTRAST TECHNIQUE: Contiguous axial images were obtained from the base of the skull through the vertex without  intravenous contrast. COMPARISON:  Head CT, 05/21/2015 and brain MRI, 06/21/2015. FINDINGS: Left-sided subdural hemorrhage appears slightly larger than it did on the prior MRI and head CT, measuring 1 cm in greatest thickness over the left parietal lobe. There is mild effacement of the underlying sulci and flattening of gyri, particularly over the left parietal lobe. There is no significant midline shift. No other intracranial hemorrhage. The ventricles are normal configuration. There is age related ventricular and sulcal enlargement. No hydrocephalus. There are no parenchymal masses. There is no evidence of a cortical infarct. Patchy white matter hypoattenuation is noted consistent with moderate to advanced chronic microvascular ischemic change. There are no extra-axial masses. Minimal dependent fluid or mucosal thickening is seen in the right maxillary sinus. Remaining sinuses are clear as are the mastoid air cells. IMPRESSION: 1. Slight increase in the size of the left subdural hematoma. This is consistent with a small amount of acute hemorrhage superimposed on a chronic subdural hemorrhage. Mild mass effect noted without midline shift. 2. No other acute findings.  No evidence of an ischemic infarct. 3. Age related volume loss and moderate to advanced chronic microvascular ischemic change. Electronically Signed   By: Amie Portland M.D.   On: 07/26/2015 12:48   Dg Chest Port 1 View  07/26/2015  CLINICAL DATA:  Seizure like activity during dialysis. Patient is confused and in a postictal state. EXAM: PORTABLE CHEST 1 VIEW COMPARISON:  10/01/2013 FINDINGS: The heart size and mediastinal contours are within normal limits. Lung volumes are low. Both lungs are clear. The visualized skeletal structures are unremarkable. IMPRESSION: 1. No acute findings. 2. Low lung volumes. Electronically Signed   By: Signa Kell M.D.   On: 07/26/2015 12:12   EKG: Independently reviewed. normal sinus rhythm, nonspecific ST and  T waves changes.  Assessment/Plan 1. SDH (subdural hematoma) (HCC) Patient presents with recurrent fall. Repeat CT scan today shows acute on chronic subdural hematoma with mass effect without midline shift. Patient at the time of my evaluation has difficulty remembering recent history and difficulty following complex command. Other than that neurologically intact. Neurosurgery recommends conservative management, repeat CAT scan 24 hours. Patient will be monitored on telemetry with serial neuro checks. PTOT and speech evaluation.  2.seizure disorder. Patient also had an episode of seizure today. Most likely in the setting of acute on chronic subdural hematoma. We will continue with vent  VAC put the patient on seizure prophylaxis as well as lorazepam every 4 hours as needed for seizure. Neurology consulted for adjustment of his medication. We will follow recommendation.  3.ESRD on hemodialysis. Nephrology will be consulted in the morning for continuation of the dialysis.  4  Anemia in chronic kidney disease 5  Thrombocytopenia (HCC) Continue close monitoring.  5  Diabetes type 2, controlled (HCC) Diet controlled. We'll monitor CBG.  Nutrition: Renal and cardiac diet DVT Prophylaxis: mechanical compression device  Advance goals of care discussion: Full code   Consults: Neurosurgery, neurology  Family Communication: family was present at bedside, opportunity was given to ask question and all questions were answered satisfactorily at the time of interview. Disposition: Admitted as inpatient, telemetry unit.  Author: Lynden Oxford, MD [facility] Pager: [phone] 07/26/2015  If 7PM-7AM, please contact night-coverage [email or web address] Password [email or web address]

## 2015-07-26 NOTE — Progress Notes (Signed)
Patient ID: [name], male   DOB: Feb 01, 1932, 80 y.o.   MRN: [id number] Note dictated. Spoke with him and family. Repeat ct head in 48 h or before prn.

## 2015-07-26 NOTE — ED Notes (Signed)
Attempted report x1. 

## 2015-07-26 NOTE — ED Notes (Signed)
Pt more alert. Pt alert to self, month, not to year

## 2015-07-26 NOTE — Progress Notes (Addendum)
Received from ED via stretcher; patient is alert; patient assisted to transfer himself in the bed with staff assist; MAE's with generalized weakness; oriented patient and his wife to room and unit routine.  Clarified with Dr.Patel that RN swallow screen could be preformed now; do not have to wait for speech therapy;  ED RN failed patient because of communication to wait for speech therapy; patient and wife deny dysphagia history.  Patient passed his screen and Dr.Patel approved a renal diet tonight.

## 2015-07-26 NOTE — ED Notes (Signed)
Pt receiving dialysis treatment, pt received 3 hours. Pt had episode during treatment with seizure like activity (shaking and closed eyes). Pt has hx of seizure. Pt in confused/post ictal state. CBG 151, BP 132/73, HR 78. Pt following commands. Pt told staff he had a seizure yesterday. Pt baseline is A/O x4. Pt was A/O when went into dialysis

## 2015-07-26 NOTE — Consult Note (Signed)
NEURO HOSPITALIST CONSULT NOTE   Requestig physician: Dr.  Edsel Petrin patel  Reason for Consult: To manage breakthrough seizures  HPI:                                                                                                                                          [name] is an 81 y.o. male with known chronic left subdural hygroma, and epilepsy. He was on Vimpat 100 mg twice a day for epilepsy. He also has end-stage renal failure. After completing his hemodialysis this morning, he had a generalized tonic-clonic seizure in the hemodialysis center and was transferred to the ER for further evaluation. He does not get any supplemental Vimpat dose after hemodialysis. Patient has underlying cognitive impairment and is a very poor historian. It is not clear if he has a neurology follow-up to monitor his antiepileptic medications.  The initial CT of the brain done in the ER showed acute left subdural hemorrhage. Patient had a fall recently 2-3 days ago and hit his head on the left side. I suspect the left subdural hemorrhages secondary to a fall.   Past Medical History  Diagnosis Date  . Hypertension   . Diabetes mellitus without complication (HCC)   . Arthritis   . History of blood transfusion   . GI bleed   . ESRD (end stage renal disease) (HCC)   . Seizure (HCC)   . Dialysis patient (HCC)   . Subdural hematoma (HCC) 05/2015    from fall    Past Surgical History  Procedure Laterality Date  . Esophagogastroduodenoscopy  10/03/2012    Procedure: ESOPHAGOGASTRODUODENOSCOPY (EGD);  Surgeon: Theda Belfast, MD;  Location: [facility] ENDOSCOPY;  Service: Endoscopy;  Laterality: N/A;  . Colonoscopy  10/04/2012    Procedure: COLONOSCOPY;  Surgeon: Theda Belfast, MD;  Location: [facility] ENDOSCOPY;  Service: Endoscopy;  Laterality: N/A;  . Colonoscopy with esophagogastroduodenoscopy (egd) Left 11/30/2012    Procedure: COLONOSCOPY WITH ESOPHAGOGASTRODUODENOSCOPY (EGD);  Surgeon: Theda Belfast, MD;  Location: [facility] ENDOSCOPY;  Service: Endoscopy;  Laterality: Left;  . Spine surgery  2004    back fusion/ [facility] Penumalli,MD  . Retinal laser procedure Right 04/2015    Family History  Problem Relation Age of Onset  . Sudden death Mother   . Sudden death Father     Family History: Unable to obtain due to patient's cognitive impairment   Social History:  reports that he has never smoked. He has never used smokeless tobacco. He reports that he does not drink alcohol or use illicit drugs.  Allergies  Allergen Reactions  . Aspirin Other (See Comments)    Bleeding   . Lactose Intolerance (Gi) Diarrhea    MEDICATIONS:  Scheduled: . [START ON 07/27/2015] amLODipine  10 mg Oral Daily  . [START ON 07/28/2015] calcitRIOL  1 mcg Oral Q M,W,F-HD  . calcium acetate  2,001 mg Oral TID WC  . cinacalcet  60 mg Oral Q supper  . divalproex  250 mg Oral Q12H  . folic acid  1 mg Oral Daily  . gabapentin  100 mg Oral Daily  . lacosamide  100 mg Oral BID  . [START ON 07/28/2015] lacosamide  50 mg Oral Q M,W,F-HD  . pantoprazole (PROTONIX) IV  40 mg Intravenous QHS  . senna-docusate  1 tablet Oral BID   Continuous:  [id number] **OR** acetaminophen, cyclobenzaprine, labetalol, LORazepam   ROS:                                                                                                                                       History obtained from unobtainable from patient due to age and mental status, cognitive impairment   Blood pressure 157/75, pulse 70, temperature 97.3 F (36.3 C), temperature source Oral, resp. rate 18, SpO2 100 %.   Neurologic Examination:                                                                                                      HEENT-  Normocephalic, no lesions, without obvious abnormality.  Normal external eye and conjunctiva.    Normal  external ears. Normal external nose,      Neurological Examination Mental Status: Alert, oriented to self, his wife, place ,not month or year, Speech fluent without evidence of aphasia.  Able to follow 2 step commands without difficulty. Cranial Nerves: II:  Visual fields grossly normal, pupils equal, round, reactive to light and accommodation III,IV, VI: ptosis not present, extra-ocular motions intact bilaterally V,VII: smile symmetric, facial light touch sensation normal bilaterally VIII: hearing normal bilaterally IX,X: uvula rises symmetrically XI: bilateral shoulder shrug XII: midline tongue extension Motor: Right : Upper extremity   5/5    Left:     Upper extremity   5/5  Lower extremity   5/5     Lower extremity   5/5 Tone and bulk:normal tone throughout; no atrophy noted Sensory: Pinprick and light touch intact throughout, bilaterally Deep Tendon Reflexes: 1+ and symmetric in biceps and triceps, rest of the DTRs absent Plantars: Right: downgoing   Left: downgoing Cerebellar: normal finger-to-nose, no appendicular ataxia noted Gait:   Deferred    Lab Results: Basic Metabolic Panel:  Recent Labs Lab 07/26/15 1210  NA 134*  K 4.8  CL 98*  CO2 24  GLUCOSE 149*  BUN 75*  CREATININE 8.37*  CALCIUM 9.0  MG 3.3*    Liver Function Tests: No results for input(s): AST, ALT, ALKPHOS, BILITOT, PROT, ALBUMIN in the last 168 hours. No results for input(s): LIPASE, AMYLASE in the last 168 hours. No results for input(s): AMMONIA in the last 168 hours.  CBC:  Recent Labs Lab 07/26/15 1210  WBC 3.7*  NEUTROABS 2.3  HGB 12.9*  HCT 40.0  MCV 102.3*  PLT 107*    Cardiac Enzymes: No results for input(s): CKTOTAL, CKMB, CKMBINDEX, TROPONINI in the last 168 hours.  Lipid Panel: No results for input(s): CHOL, TRIG, HDL, CHOLHDL, VLDL, LDLCALC in the last 168 hours.  CBG: No results for input(s): GLUCAP in the last 168 hours.  Microbiology: Results for  orders placed or performed during the hospital encounter of 06/20/15  Culture, blood (routine x 2)     Status: None   Collection Time: 06/20/15 11:18 PM  Result Value Ref Range Status   Specimen Description BLOOD LEFT ARM  Final   Special Requests   Final    BOTTLES DRAWN AEROBIC AND ANAEROBIC 5CC BLUE AND 3CC RED   Culture NO GROWTH 5 DAYS  Final   Report Status 06/25/2015 FINAL  Final  Culture, blood (routine x 2)     Status: None   Collection Time: 06/20/15 11:28 PM  Result Value Ref Range Status   Specimen Description BLOOD LEFT ANTECUBITAL  Final   Special Requests BOTTLES DRAWN AEROBIC AND ANAEROBIC 3CC   Final   Culture NO GROWTH 5 DAYS  Final   Report Status 06/25/2015 FINAL  Final    Coagulation Studies:  Recent Labs  07/26/15 1647  LABPROT 14.2  INR 1.08    Imaging: Ct Head Wo Contrast  07/26/2015  CLINICAL DATA:  Patient has a history of seizure, while at dialysis patient had a seizure today. Patient told staff he had a seizure yesterday, he altered and confused EXAM: CT HEAD WITHOUT CONTRAST TECHNIQUE: Contiguous axial images were obtained from the base of the skull through the vertex without intravenous contrast. COMPARISON:  Head CT, 05/21/2015 and brain MRI, 06/21/2015. FINDINGS: Left-sided subdural hemorrhage appears slightly larger than it did on the prior MRI and head CT, measuring 1 cm in greatest thickness over the left parietal lobe. There is mild effacement of the underlying sulci and flattening of gyri, particularly over the left parietal lobe. There is no significant midline shift. No other intracranial hemorrhage. The ventricles are normal configuration. There is age related ventricular and sulcal enlargement. No hydrocephalus. There are no parenchymal masses. There is no evidence of a cortical infarct. Patchy white matter hypoattenuation is noted consistent with moderate to advanced chronic microvascular ischemic change. There are no extra-axial masses. Minimal  dependent fluid or mucosal thickening is seen in the right maxillary sinus. Remaining sinuses are clear as are the mastoid air cells. IMPRESSION: 1. Slight increase in the size of the left subdural hematoma. This is consistent with a small amount of acute hemorrhage superimposed on a chronic subdural hemorrhage. Mild mass effect noted without midline shift. 2. No other acute findings.  No evidence of an ischemic infarct. 3. Age related volume loss and moderate to advanced chronic microvascular ischemic change. Electronically Signed   By: Amie Portland M.D.   On: 07/26/2015 12:48   Dg Chest Port 1 View  07/26/2015  CLINICAL DATA:  Seizure like activity during  dialysis. Patient is confused and in a postictal state. EXAM: PORTABLE CHEST 1 VIEW COMPARISON:  10/01/2013 FINDINGS: The heart size and mediastinal contours are within normal limits. Lung volumes are low. Both lungs are clear. The visualized skeletal structures are unremarkable. IMPRESSION: 1. No acute findings. 2. Low lung volumes. Electronically Signed   By: Signa Kell M.D.   On: 07/26/2015 12:12     Assessment/Plan:  81 year old male patient with baseline significant cognitive impairment, very poor historian, known epilepsy, was on Vimpat 100 mg twice a day. He he was transferred to the ER from hemodialysis Center following a seizure immediately after the hemodialysis. Vimpat is dialyzed during hemodialysis, and hence is the likely cause for his seizure after hemodialysis. I recommend adding Depakote 250 mg twice a day to his regimen. In addition I also recommend adding a supplemental 50 mg of Vimpat immediately following the hemodialysis and continue with the current dose of 100 mg twice a day. Depakote is not removed with routine hemodialysis. He has an acute small left subdural hematoma secondary to fall to 3 days ago. He has been evaluated by neurosurgery, who has recommended conservative management at this time. His antiepileptic  medication regimen has been discussed in detail with his wife. Advised to follow up closely with a neurologist after discharge.

## 2015-07-27 DIAGNOSIS — E1142 Type 2 diabetes mellitus with diabetic polyneuropathy: Secondary | ICD-10-CM

## 2015-07-27 DIAGNOSIS — N186 End stage renal disease: Secondary | ICD-10-CM

## 2015-07-27 DIAGNOSIS — Z992 Dependence on renal dialysis: Secondary | ICD-10-CM

## 2015-07-27 DIAGNOSIS — I6200 Nontraumatic subdural hemorrhage, unspecified: Secondary | ICD-10-CM

## 2015-07-27 DIAGNOSIS — G40909 Epilepsy, unspecified, not intractable, without status epilepticus: Principal | ICD-10-CM

## 2015-07-27 LAB — COMPREHENSIVE METABOLIC PANEL
ALK PHOS: 44 U/L (ref 38–126)
ALT: 10 U/L — AB (ref 17–63)
AST: 10 U/L — AB (ref 15–41)
Albumin: 3.4 g/dL — ABNORMAL LOW (ref 3.5–5.0)
Anion gap: 11 (ref 5–15)
BUN: 80 mg/dL — AB (ref 6–20)
CALCIUM: 8.7 mg/dL — AB (ref 8.9–10.3)
CHLORIDE: 99 mmol/L — AB (ref 101–111)
CO2: 26 mmol/L (ref 22–32)
CREATININE: 9.73 mg/dL — AB (ref 0.61–1.24)
GFR calc Af Amer: 5 mL/min — ABNORMAL LOW (ref >60)
GFR, EST NON AFRICAN AMERICAN: 4 mL/min — AB (ref >60)
Glucose, Bld: 165 mg/dL — ABNORMAL HIGH (ref 65–99)
Potassium: 5 mmol/L (ref 3.5–5.1)
SODIUM: 136 mmol/L (ref 135–145)
Total Bilirubin: 0.8 mg/dL (ref 0.3–1.2)
Total Protein: 6.4 g/dL — ABNORMAL LOW (ref 6.5–8.1)

## 2015-07-27 LAB — CBC WITH DIFFERENTIAL/PLATELET
BASOS ABS: 0 10*3/uL (ref 0.0–0.1)
Basophils Relative: 0 %
EOS PCT: 4 %
Eosinophils Absolute: 0.1 10*3/uL (ref 0.0–0.7)
HCT: 35.9 % — ABNORMAL LOW (ref 39.0–52.0)
HEMOGLOBIN: 11.6 g/dL — AB (ref 13.0–17.0)
LYMPHS ABS: 1.2 10*3/uL (ref 0.7–4.0)
LYMPHS PCT: 40 %
MCH: 33 pg (ref 26.0–34.0)
MCHC: 32.3 g/dL (ref 30.0–36.0)
MCV: 102 fL — AB (ref 78.0–100.0)
Monocytes Absolute: 0.3 10*3/uL (ref 0.1–1.0)
Monocytes Relative: 9 %
NEUTROS PCT: 47 %
Neutro Abs: 1.4 10*3/uL — ABNORMAL LOW (ref 1.7–7.7)
PLATELETS: 110 10*3/uL — AB (ref 150–400)
RBC: 3.52 MIL/uL — AB (ref 4.22–5.81)
RDW: 17.2 % — ABNORMAL HIGH (ref 11.5–15.5)
WBC: 3 10*3/uL — AB (ref 4.0–10.5)

## 2015-07-27 MED ORDER — PANTOPRAZOLE SODIUM 40 MG PO TBEC
40.0000 mg | DELAYED_RELEASE_TABLET | Freq: Every day | ORAL | Status: DC
  Administered 2015-07-27 – 2015-07-28 (×2): 40 mg via ORAL
  Filled 2015-07-27 (×2): qty 1

## 2015-07-27 NOTE — Progress Notes (Signed)
Subjective:  81 year old male patient with epilepsy was on Vimpat 100 mg twice a day started by his outpatient neurologist. He had a seizure history following hemodialysis which he had undergone multiple prior medications and was admitted here. I started him on Depakote 250 twice a day in addition to the Vimpat last night. His tolerating well, remain stable no further seizures since admission.  He also has a small left acute subdural hematoma secondary to a fall 3-4 days ago. He is asymptomatic from this at this time. No other neurological symptoms to address. He is in fact more awake and alert and having better conversation this morning , not in any distress.   Exam: Filed Vitals:   07/27/15 1409 07/27/15 1817  BP: 151/72 175/74  Pulse: 68 73  Temp: 97.5 F (36.4 C) 97.6 F (36.4 C)  Resp: 16 17   Alert, oriented 3, speech is fluent, no evidence of dysarthria or aphasia noted. Cranial nerve examination 2 through 12 intact Motor examination showed normal tone, bulk and full 5/5 motor strength in all 4 extremities Grossly normal sensation to pinprick in all 4 extremities Coordination : no evidence of appendicular ataxia. No abnormal involuntary movements noted.  Deep tendon reflexes WNL.    Impression:   81 year old male patient with epilepsy, currently stable with no further seizures. His current anti-epileptic medication regimen includes Vimpat 100 mg twice a day with an additional dose of 50 mg immediately following hemodialysis. He is also on Depakote 250 mg twice a day. His tolerating these medications well with no side effects. He has a small left acute subdural hematoma from a fall 3-4 days ago, which is a symptomatic at this time. No other new neurological issues to address at this time. We will follow up when necessary.  please call for any further questions.

## 2015-07-27 NOTE — Consult Note (Signed)
Renal Service Consult Note [facility]  [name] 07/27/2015 [name] Requesting Physician:  Dr Benjamine Mola  Reason for Consult:  ESRD pt w seizure and acute/ chronic subdural hematoma HPI: The patient is a 81 y.o. year-old with hx seizure d/o chronic left SDH, HTN, DM and ESRD on HD MWF.  Patient was at HD on Saturday (not sure why because he is MWF pt) morning and after about 3 hours of HD had a T-C seizure.  Hx of seizures. Was confused after event and HD was stopped and pt sent to ED.  Pt had told staff he had had a seizure the day before also.  He was sent to ED. CT scan of brain showed new acute on chronic subdural bleed on left brain.  Seen by Nsurg and neurology. Patient apparently had fallen at home 2-3 days prior and hit left side of his head. Neuro recommended adding Depakote 250 bid to seizure regimen noting that Vimpat is dialyzed out w HD. Also recommended a supplemental Vimpat 50 mg after each HD in addition to his 100 mg bid. NSurg recommended conservative Rx.  His gait has been off per RN.    Patient w/o complaints. No SOB/ CP/ n/v/d/ abd pain.     Past Medical History  Past Medical History  Diagnosis Date  . Hypertension   . Diabetes mellitus without complication (HCC)   . Arthritis   . History of blood transfusion   . GI bleed   . ESRD (end stage renal disease) (HCC)   . Seizure (HCC)   . Dialysis patient (HCC)   . Subdural hematoma (HCC) 05/2015    from fall   Past Surgical History  Past Surgical History  Procedure Laterality Date  . Esophagogastroduodenoscopy  10/03/2012    Procedure: ESOPHAGOGASTRODUODENOSCOPY (EGD);  Surgeon: Theda Belfast, MD;  Location: [facility] ENDOSCOPY;  Service: Endoscopy;  Laterality: N/A;  . Colonoscopy  10/04/2012    Procedure: COLONOSCOPY;  Surgeon: Theda Belfast, MD;  Location: [facility] ENDOSCOPY;  Service: Endoscopy;  Laterality: N/A;  . Colonoscopy with esophagogastroduodenoscopy (egd) Left 11/30/2012    Procedure:  COLONOSCOPY WITH ESOPHAGOGASTRODUODENOSCOPY (EGD);  Surgeon: Theda Belfast, MD;  Location: [facility] ENDOSCOPY;  Service: Endoscopy;  Laterality: Left;  . Spine surgery  2004    back fusion/ [facility] Penumalli,MD  . Retinal laser procedure Right 04/2015   Family History  Family History  Problem Relation Age of Onset  . Sudden death Mother   . Sudden death Father    Social History  reports that he has never smoked. He has never used smokeless tobacco. He reports that he does not drink alcohol or use illicit drugs. Allergies  Allergies  Allergen Reactions  . Aspirin Other (See Comments)    Bleeding   . Lactose Intolerance (Gi) Diarrhea   Home medications Prior to Admission medications   Medication Sig Start Date End Date Taking? Authorizing Provider  amLODipine (NORVASC) 10 MG tablet Take 10 mg by mouth Daily.  08/15/12  Yes Historical Provider, MD  calcitRIOL (ROCALTROL) 0.5 MCG capsule Take 2 capsules (1 mcg total) by mouth every Monday, Wednesday, and Friday with hemodialysis. 05/23/15  Yes Lonia Blood, MD  calcium acetate (PHOSLO) 667 MG capsule Take 3 capsules by mouth three times a day 07/15/15  Yes Sharon Seller, NP  cinacalcet (SENSIPAR) 30 MG tablet Take 2 tablets (60 mg total) by mouth daily with supper. 06/22/15  Yes Catarina Hartshorn, MD  colchicine 0.6 MG tablet Take 0.6  mg by mouth as needed.  02/14/14  Yes Historical Provider, MD  cyclobenzaprine (FLEXERIL) 5 MG tablet Take one tablet by mouth once daily as needed for spasms 07/23/15  Yes [name] L [name], DO  fexofenadine (ALLEGRA) 180 MG tablet Take 180 mg by mouth daily. As needed   Yes Historical Provider, MD  folic acid (FOLVITE) 1 MG tablet Take 1 tablet (1 mg total) by mouth daily. 09/29/13  Yes Joseph Art, DO  gabapentin (NEURONTIN) 100 MG capsule Take 1 capsule (100 mg total) by mouth daily. 05/23/15  Yes Lonia Blood, MD  glucose blood (ACCU-CHEK AVIVA PLUS) test strip Check blood sugar three times daily 07/21/15  Yes  [name] L [name], DO  HUMALOG KWIKPEN 100 UNIT/ML KiwkPen Sliding Scale 04/16/15  Yes Historical Provider, MD  Lacosamide 100 MG TABS Take 1 tablet (100 mg total) by mouth 2 (two) times daily. 07/21/15  Yes [name] L [name], DO  multivitamin (RENA-VIT) TABS tablet Take 1 tablet by mouth at bedtime. 09/29/13  Yes Jessica U Vann, DO  senna-docusate (SENOKOT-S) 8.6-50 MG per tablet Take 1 tablet by mouth. As needed   Yes Historical Provider, MD  triamcinolone (NASACORT ALLERGY 24HR) 55 MCG/ACT AERO nasal inhaler Place 1 spray into the nose. One spray nasal as needed for allery   Yes Historical Provider, MD  BESIVANCE 0.6 % SUSP Place 1 drop into the right eye 4 (four) times daily as needed. For 2 days after each monthly injection 05/08/15   Historical Provider, MD  darbepoetin (ARANESP) 60 MCG/0.3ML SOLN injection Inject 0.3 mLs (60 mcg total) into the vein every Thursday with hemodialysis. Patient taking differently: Inject 60 mcg into the vein every 7 (seven) days.  09/29/13   Joseph Art, DO  sodium chloride 0.9 % SOLN 100 mL with ferric gluconate 12.5 MG/ML SOLN 62.5 mg Inject 62.5 mg into the vein every Thursday with hemodialysis. 09/29/13   Joseph Art, DO   Liver Function Tests  Recent Labs Lab 07/27/15 0503  AST 10*  ALT 10*  ALKPHOS 44  BILITOT 0.8  PROT 6.4*  ALBUMIN 3.4*   No results for input(s): LIPASE, AMYLASE in the last 168 hours. CBC  Recent Labs Lab 07/26/15 1210 07/27/15 0503  WBC 3.7* 3.0*  NEUTROABS 2.3 1.4*  HGB 12.9* 11.6*  HCT 40.0 35.9*  MCV 102.3* 102.0*  PLT 107* 110*   Basic Metabolic Panel  Recent Labs Lab 07/26/15 1210 07/27/15 0503  NA 134* 136  K 4.8 5.0  CL 98* 99*  CO2 24 26  GLUCOSE 149* 165*  BUN 75* 80*  CREATININE 8.37* 9.73*  CALCIUM 9.0 8.7*    Filed Vitals:   07/27/15 0545 07/27/15 0953 07/27/15 1409 07/27/15 1817  BP: 148/72 145/66 151/72 175/74  Pulse: 61 67 68 73  Temp: 97.5 F (36.4 C) 97.5 F (36.4 C) 97.5 F (36.4 C)  97.6 F (36.4 C)  TempSrc: Oral Oral Oral Oral  Resp: 18 18 16 17   Height:      Weight:      SpO2: 100% 97% 100% 100%   Exam Pleasant , alert, up in chair, no distress No rash, cyanosis or gangrene Sclera anicteric, throat clear No jvd Chest clear bilat no rales RRR no RMG Abd soft ntnd no mass or ascites GU normal male Ext no LE or UE edema; old scars R lower leg RUA AVF +bruit Neuro is alert, nonfocal  MWF  3h [place]   88.5kg  2/2.25 bath  Heparin none  RUA AVF Venofer 50/wk Calcitriol 0.50 ug tiw Mircera 200 q2 due 11/23 Hb 10.9  Assessment: 1. Fall / acute on chron SDH - gait disturbance associated.  Conservative Rx planned 2. Seizure d/o - depakote added to Vimpat 3. ESRD on HD MWF 4. HTN cont norvasc 5. Anemia Hb up, hold esa 6. Volume up 4kg by wts   Plan- as above. HD tomorrow.  [name] [employer] pager [phone]    cell [phone] 07/27/2015, 7:04 PM

## 2015-07-27 NOTE — Progress Notes (Signed)
PROGRESS NOTE  [name] AJO:[id number] DOB: [birth year] DOA: 07/26/2015 PCP: [name], DO  Assessment/Plan: SDH (subdural hematoma) (HCC) -recurrent fall. Neurosurgery recommends conservative management, repeat CAT scan 24 hours. PT/OT and speech evaluation- lives at home with wife  seizure disorder. Neurology consult: Vimpat is dialyzed during hemodialysis, and hence is the likely cause for his seizure after hemodialysis. add Depakote 250 mg twice a day to his regimen.  add a supplemental 50 mg of Vimpat immediately following the hemodialysis and continue with the current dose of 100 mg twice a day. Depakote is not removed with routine hemodialysis.   - will plan for dialysis here in AM and d/c after if seizure free  ESRD on hemodialysis. Nephrology consult for M/W/F dialysis  Anemia in chronic kidney disease  Thrombocytopenia (HCC) Continue close monitoring.  Diabetes type 2, controlled (HCC) Diet controlled. monitor CBG.   Code Status: full Family Communication: patient- wife at church Disposition Plan:    Consultants:  Neuro  neprho  Procedures:      HPI/Subjective: Watching TV this AM   Objective: Filed Vitals:   07/27/15 0953  BP: 145/66  Pulse: 67  Temp: 97.5 F (36.4 C)  Resp: 18    Intake/Output Summary (Last 24 hours) at 07/27/15 1007 Last data filed at 07/27/15 0819  Gross per 24 hour  Intake      0 ml  Output    250 ml  Net   -250 ml   Filed Weights   07/26/15 1730  Weight: 92.8 kg (204 lb 9.4 oz)    Exam:   General:  Awake, NAD  Cardiovascular: rrr  Respiratory: clear  Abdomen: +BS, soft  Musculoskeletal: no edema  Data Reviewed: Basic Metabolic Panel:  Recent Labs Lab 07/26/15 1210 07/27/15 0503  NA 134* 136  K 4.8 5.0  CL 98* 99*  CO2 24 26  GLUCOSE 149* 165*  BUN 75* 80*  CREATININE 8.37* 9.73*  CALCIUM 9.0 8.7*  MG 3.3*  --    Liver Function Tests:  Recent Labs Lab 07/27/15 0503  AST  10*  ALT 10*  ALKPHOS 44  BILITOT 0.8  PROT 6.4*  ALBUMIN 3.4*   No results for input(s): LIPASE, AMYLASE in the last 168 hours. No results for input(s): AMMONIA in the last 168 hours. CBC:  Recent Labs Lab 07/26/15 1210 07/27/15 0503  WBC 3.7* 3.0*  NEUTROABS 2.3 1.4*  HGB 12.9* 11.6*  HCT 40.0 35.9*  MCV 102.3* 102.0*  PLT 107* 110*   Cardiac Enzymes: No results for input(s): CKTOTAL, CKMB, CKMBINDEX, TROPONINI in the last 168 hours. BNP (last 3 results) No results for input(s): BNP in the last 8760 hours.  ProBNP (last 3 results) No results for input(s): PROBNP in the last 8760 hours.  CBG: No results for input(s): GLUCAP in the last 168 hours.  No results found for this or any previous visit (from the past 240 hour(s)).   Studies: Ct Head Wo Contrast  07/26/2015  CLINICAL DATA:  Patient has a history of seizure, while at dialysis patient had a seizure today. Patient told staff he had a seizure yesterday, he altered and confused EXAM: CT HEAD WITHOUT CONTRAST TECHNIQUE: Contiguous axial images were obtained from the base of the skull through the vertex without intravenous contrast. COMPARISON:  Head CT, 05/21/2015 and brain MRI, 06/21/2015. FINDINGS: Left-sided subdural hemorrhage appears slightly larger than it did on the prior MRI and head CT, measuring 1 cm in greatest thickness over the left parietal lobe.  There is mild effacement of the underlying sulci and flattening of gyri, particularly over the left parietal lobe. There is no significant midline shift. No other intracranial hemorrhage. The ventricles are normal configuration. There is age related ventricular and sulcal enlargement. No hydrocephalus. There are no parenchymal masses. There is no evidence of a cortical infarct. Patchy white matter hypoattenuation is noted consistent with moderate to advanced chronic microvascular ischemic change. There are no extra-axial masses. Minimal dependent fluid or mucosal  thickening is seen in the right maxillary sinus. Remaining sinuses are clear as are the mastoid air cells. IMPRESSION: 1. Slight increase in the size of the left subdural hematoma. This is consistent with a small amount of acute hemorrhage superimposed on a chronic subdural hemorrhage. Mild mass effect noted without midline shift. 2. No other acute findings.  No evidence of an ischemic infarct. 3. Age related volume loss and moderate to advanced chronic microvascular ischemic change. Electronically Signed   By: Amie Portland M.D.   On: 07/26/2015 12:48   Dg Chest Port 1 View  07/26/2015  CLINICAL DATA:  Seizure like activity during dialysis. Patient is confused and in a postictal state. EXAM: PORTABLE CHEST 1 VIEW COMPARISON:  10/01/2013 FINDINGS: The heart size and mediastinal contours are within normal limits. Lung volumes are low. Both lungs are clear. The visualized skeletal structures are unremarkable. IMPRESSION: 1. No acute findings. 2. Low lung volumes. Electronically Signed   By: Signa Kell M.D.   On: 07/26/2015 12:12    Scheduled Meds: . amLODipine  10 mg Oral Daily  . [START ON 07/28/2015] calcitRIOL  1 mcg Oral Q M,W,F-HD  . calcium acetate  2,001 mg Oral TID WC  . cinacalcet  60 mg Oral Q supper  . divalproex  250 mg Oral Q12H  . folic acid  1 mg Oral Daily  . gabapentin  100 mg Oral Daily  . lacosamide  100 mg Oral BID  . [START ON 07/28/2015] lacosamide  50 mg Oral Q M,W,F-HD  . pantoprazole (PROTONIX) IV  40 mg Intravenous QHS  . senna-docusate  1 tablet Oral BID   Continuous Infusions:  Antibiotics Given (last 72 hours)    None      Principal Problem:   SDH (subdural hematoma) (HCC) Active Problems:   Hypertension   history of AVM (arteriovenous malformation) of colon with hemorrhage   Anemia in chronic kidney disease   Thrombocytopenia (HCC)   Diabetes type 2, controlled (HCC)   ESRD on dialysis [facility])   Seizure disorder (HCC)   Word finding  difficulty    Time spent: 25 min    Ozell Juhasz [facility]  [facility] Pager [phone]. If 7PM-7AM, please contact night-coverage at [email or web address], password [facility] 07/27/2015, 10:07 AM  LOS: 1 day

## 2015-07-27 NOTE — Progress Notes (Signed)
Placed patient on CPAP for the night via auto-mode with minimum pressure set at 5cm and maximum pressure set at 20cm  

## 2015-07-27 NOTE — Progress Notes (Signed)
Patient ID: [name], male   DOB: 1931/12/22, 81 y.o.   MRN: [id number] Stable . No headache, no weakness

## 2015-07-27 NOTE — Evaluation (Signed)
Physical Therapy Evaluation Patient Details Name: [name] MRN: [id number] DOB: 1932-04-27 Today's Date: 07/27/2015   History of Present Illness  81 y.o. male with Past medical history of hypertension, diabetes mellitus, recurrent fall, and ESRD on hemodialysis. He was admitted for recent subdural hematoma leading to seizure.  Clinical Impression  Pt admitted with above diagnosis. Pt currently with functional limitations due to the deficits listed below (see PT Problem List).  Pt will benefit from skilled PT to increase their independence and safety with mobility to allow discharge to the venue listed below.  Recommended SNF at d/c; however, pt and spouse are both refusing. Pt, therefore, would benefit from continuation of HHPT upon d/c.     Follow Up Recommendations Home health PT;Supervision/Assistance - 24 hour (Pt refusing SNF.)    Equipment Recommendations  None recommended by PT    Recommendations for Other Services       Precautions / Restrictions Precautions Precautions: Fall      Mobility  Bed Mobility Overal bed mobility: Modified Independent             General bed mobility comments: use of bed rails  Transfers Overall transfer level: Needs assistance Equipment used: Rolling walker (2 wheeled) Transfers: Sit to/from [employer] Sit to Stand: Min guard Stand pivot transfers: Min guard       General transfer comment: verbal cues for sequencing and safety  Ambulation/Gait Ambulation/Gait assistance: Min assist;Min guard Ambulation Distance (Feet): 175 Feet Assistive device: Rolling walker (2 wheeled) Gait Pattern/deviations: Step-through pattern;Trunk flexed;Decreased stride length Gait velocity: decreased Gait velocity interpretation: Below normal speed for age/gender General Gait Details: Pt with multiple episodes of bilat knee buckling requiring min assist to maintain balance. Pt reports these episodes as spasms and as the cause  for his falls.  Stairs            Wheelchair Mobility    Modified Rankin (Stroke Patients Only) Modified Rankin (Stroke Patients Only) Pre-Morbid Rankin Score: Slight disability Modified Rankin: Moderate disability     Balance Overall balance assessment: History of Falls                                           Pertinent Vitals/Pain Pain Assessment: No/denies pain    Home Living Family/patient expects to be discharged to:: Private residence Living Arrangements: Spouse/significant other Available Help at Discharge: Family;Available 24 hours/day Type of Home: House Home Access: Ramped entrance     Home Layout: Two level Home Equipment: Walker - 2 wheels;Bedside commode Additional Comments: Wife was present to verify information    Prior Function Level of Independence: Needs assistance   Gait / Transfers Assistance Needed: with RW and supervision; recent history of falls           Hand Dominance   Dominant Hand: Right    Extremity/Trunk Assessment   Upper Extremity Assessment: Defer to OT evaluation           Lower Extremity Assessment: Generalized weakness      Cervical / Trunk Assessment: Kyphotic  Communication   Communication: No difficulties  Cognition Arousal/Alertness: Awake/alert Behavior During Therapy: WFL for tasks assessed/performed Overall Cognitive Status: History of cognitive impairments - at baseline       Memory: Decreased short-term memory              General Comments      Exercises  Assessment/Plan    PT Assessment Patient needs continued PT services  PT Diagnosis Difficulty walking;Generalized weakness   PT Problem List Decreased strength;Decreased activity tolerance;Decreased balance;Decreased mobility;Decreased safety awareness  PT Treatment Interventions Gait training;Functional mobility training;Therapeutic activities;DME instruction;Therapeutic exercise;Patient/family  education;Balance training   PT Goals (Current goals can be found in the Care Plan section) Acute Rehab PT Goals Patient Stated Goal: home PT Goal Formulation: With patient/family Time For Goal Achievement: 08/10/15 Potential to Achieve Goals: Good    Frequency Min 3X/week   Barriers to discharge        Co-evaluation               End of Session Equipment Utilized During Treatment: Gait belt Activity Tolerance: Patient tolerated treatment well Patient left: in chair;with family/visitor present;with chair alarm set;with call bell/phone within reach Nurse Communication: Mobility status         Time: 0938-1829 PT Time Calculation (min) (ACUTE ONLY): 27 min   Charges:   PT Evaluation $Initial PT Evaluation Tier I: 1 Procedure PT Treatments $Gait Training: 8-22 mins   PT G Codes:        Ilda Foil 07/27/2015, 4:23 PM

## 2015-07-28 ENCOUNTER — Inpatient Hospital Stay (HOSPITAL_COMMUNITY): Payer: Medicare Other

## 2015-07-28 ENCOUNTER — Telehealth: Payer: Self-pay | Admitting: Diagnostic Neuroimaging

## 2015-07-28 DIAGNOSIS — D696 Thrombocytopenia, unspecified: Secondary | ICD-10-CM

## 2015-07-28 MED ORDER — LACOSAMIDE 50 MG PO TABS: 50.0000 mg | ORAL_TABLET | ORAL | Status: DC

## 2015-07-28 MED ORDER — SODIUM CHLORIDE 0.9 % IV SOLN: 100.0000 mL | INTRAVENOUS | Status: DC | PRN

## 2015-07-28 MED ORDER — HEPARIN SODIUM (PORCINE) 1000 UNIT/ML DIALYSIS: 1000.0000 [IU] | INTRAMUSCULAR | Status: DC | PRN

## 2015-07-28 MED ORDER — FLEET ENEMA 7-19 GM/118ML RE ENEM
1.0000 | ENEMA | Freq: Once | RECTAL | Status: AC
  Administered 2015-07-28: 1 via RECTAL
  Filled 2015-07-28: qty 1

## 2015-07-28 MED ORDER — DIVALPROEX SODIUM 250 MG PO DR TAB: 250.0000 mg | DELAYED_RELEASE_TABLET | Freq: Two times a day (BID) | ORAL | Status: DC

## 2015-07-28 MED ORDER — LIDOCAINE HCL (PF) 1 % IJ SOLN: 5.0000 mL | INTRAMUSCULAR | Status: DC | PRN

## 2015-07-28 MED ORDER — ALTEPLASE 2 MG IJ SOLR: 2.0000 mg | Freq: Once | INTRAMUSCULAR | Status: DC | PRN

## 2015-07-28 MED ORDER — LIDOCAINE-PRILOCAINE 2.5-2.5 % EX CREA: 1.0000 "application " | TOPICAL_CREAM | CUTANEOUS | Status: DC | PRN

## 2015-07-28 MED ORDER — PENTAFLUOROPROP-TETRAFLUOROETH EX AERO: 1.0000 "application " | INHALATION_SPRAY | CUTANEOUS | Status: DC | PRN

## 2015-07-28 NOTE — Telephone Encounter (Signed)
I called and spoke to the patient's wife Currently  He is admitted with a fall and subdural hematoma in the hospital. The patient would be seen by neuro hospitalist and neurosurgeon's while he is there. He was advised to call and make an appointment to see Dr. Aundra Dubin after discharge

## 2015-07-28 NOTE — Progress Notes (Signed)
Patient will have nurse call when ready for CPAP 

## 2015-07-28 NOTE — Telephone Encounter (Signed)
Pt' wife called and state the pt is currently admitted to the hospital. He is expected to be d/c'd today but would like a f/u visit tomorrow , with anyone she says. Please call and advise [phone] Pt is in room 5110- room phone number is [phone]. Pt cell : [phone]. Wife is unsure why the pt would like a visit so soon after being in the hospital. She suggest giving him a call first.

## 2015-07-28 NOTE — Consult Note (Signed)
Chief Complaint: Patient was seen in consultation today for tunneled hemodialysis catheter placement Chief Complaint  Patient presents with  . Seizures   at the request of Dr Arrie Aran  Referring Physician(s): Dr Arrie Aran  History of Present Illness: [name] is a 81 y.o. male   ESRD Unable to cannalize Rt arm dialysis graft today. Intended for IR to perform fistulogram with possible declot---  but pt with recent Subdural hematoma after fall 05/2015 Slight increase in size as recently as 2 days ago Hx Seizure disorder Cannot anticoagulate or use thrombolytics Dr Grace Isaac recommended tunneled Hemodialysis catheter for now Dr Arrie Aran agrees Pt did eat lunch today Will plan procedure for 11/22 in IR  Past Medical History  Diagnosis Date  . Hypertension   . Diabetes mellitus without complication (HCC)   . Arthritis   . History of blood transfusion   . GI bleed   . ESRD (end stage renal disease) (HCC)   . Seizure (HCC)   . Dialysis patient (HCC)   . Subdural hematoma (HCC) 05/2015    from fall    Past Surgical History  Procedure Laterality Date  . Esophagogastroduodenoscopy  10/03/2012    Procedure: ESOPHAGOGASTRODUODENOSCOPY (EGD);  Surgeon: Theda Belfast, MD;  Location: [facility] ENDOSCOPY;  Service: Endoscopy;  Laterality: N/A;  . Colonoscopy  10/04/2012    Procedure: COLONOSCOPY;  Surgeon: Theda Belfast, MD;  Location: [facility] ENDOSCOPY;  Service: Endoscopy;  Laterality: N/A;  . Colonoscopy with esophagogastroduodenoscopy (egd) Left 11/30/2012    Procedure: COLONOSCOPY WITH ESOPHAGOGASTRODUODENOSCOPY (EGD);  Surgeon: Theda Belfast, MD;  Location: [facility] ENDOSCOPY;  Service: Endoscopy;  Laterality: Left;  . Spine surgery  2004    back fusion/ [facility] Penumalli,MD  . Retinal laser procedure Right 04/2015    Allergies: Aspirin and Lactose intolerance (gi)  Medications: Prior to Admission medications   Medication Sig Start Date End Date Taking? Authorizing Provider    amLODipine (NORVASC) 10 MG tablet Take 10 mg by mouth Daily.  08/15/12  Yes Historical Provider, MD  calcitRIOL (ROCALTROL) 0.5 MCG capsule Take 2 capsules (1 mcg total) by mouth every Monday, Wednesday, and Friday with hemodialysis. 05/23/15  Yes Lonia Blood, MD  calcium acetate (PHOSLO) 667 MG capsule Take 3 capsules by mouth three times a day 07/15/15  Yes Sharon Seller, NP  cinacalcet (SENSIPAR) 30 MG tablet Take 2 tablets (60 mg total) by mouth daily with supper. 06/22/15  Yes Catarina Hartshorn, MD  colchicine 0.6 MG tablet Take 0.6 mg by mouth as needed.  02/14/14  Yes Historical Provider, MD  cyclobenzaprine (FLEXERIL) 5 MG tablet Take one tablet by mouth once daily as needed for spasms 07/23/15  Yes [name] L [name], DO  fexofenadine (ALLEGRA) 180 MG tablet Take 180 mg by mouth daily. As needed   Yes Historical Provider, MD  folic acid (FOLVITE) 1 MG tablet Take 1 tablet (1 mg total) by mouth daily. 09/29/13  Yes Joseph Art, DO  gabapentin (NEURONTIN) 100 MG capsule Take 1 capsule (100 mg total) by mouth daily. 05/23/15  Yes Lonia Blood, MD  glucose blood (ACCU-CHEK AVIVA PLUS) test strip Check blood sugar three times daily 07/21/15  Yes [name] L [name], DO  HUMALOG KWIKPEN 100 UNIT/ML KiwkPen Sliding Scale 04/16/15  Yes Historical Provider, MD  Lacosamide 100 MG TABS Take 1 tablet (100 mg total) by mouth 2 (two) times daily. 07/21/15  Yes [name] L [name], DO  multivitamin (RENA-VIT) TABS tablet Take 1 tablet by mouth at  bedtime. 09/29/13  Yes Jessica U Vann, DO  senna-docusate (SENOKOT-S) 8.6-50 MG per tablet Take 1 tablet by mouth. As needed   Yes Historical Provider, MD  triamcinolone (NASACORT ALLERGY 24HR) 55 MCG/ACT AERO nasal inhaler Place 1 spray into the nose. One spray nasal as needed for allery   Yes Historical Provider, MD  BESIVANCE 0.6 % SUSP Place 1 drop into the right eye 4 (four) times daily as needed. For 2 days after each monthly injection 05/08/15   Historical Provider, MD   darbepoetin (ARANESP) 60 MCG/0.3ML SOLN injection Inject 0.3 mLs (60 mcg total) into the vein every Thursday with hemodialysis. Patient taking differently: Inject 60 mcg into the vein every 7 (seven) days.  09/29/13   Joseph Art, DO  divalproex (DEPAKOTE) 250 MG DR tablet Take 1 tablet (250 mg total) by mouth every 12 (twelve) hours. 07/28/15   Joseph Art, DO  lacosamide (VIMPAT) 50 MG TABS tablet Take 1 tablet (50 mg total) by mouth every Monday, Wednesday, and Friday with hemodialysis. 07/28/15   Joseph Art, DO  sodium chloride 0.9 % SOLN 100 mL with ferric gluconate 12.5 MG/ML SOLN 62.5 mg Inject 62.5 mg into the vein every Thursday with hemodialysis. 09/29/13   Joseph Art, DO     Family History  Problem Relation Age of Onset  . Sudden death Mother   . Sudden death Father     Social History   Social History  . Marital Status: Married    Spouse Name: patricia  . Number of Children: 2  . Years of Education: PHD   Occupational History  . Retired    Social History Main Topics  . Smoking status: Never Smoker   . Smokeless tobacco: Never Used  . Alcohol Use: No  . Drug Use: No  . Sexual Activity: No   Other Topics Concern  . None   Social History Narrative   Patient currently in [facility] rehab.   Caffeine Use: 1-2 cups occasionally     Review of Systems: A 12 point ROS discussed and pertinent positives are indicated in the HPI above.  All other systems are negative.  Review of Systems  Constitutional: Negative for fever, activity change, appetite change and fatigue.  Respiratory: Negative for shortness of breath.   Neurological: Positive for weakness.  Psychiatric/Behavioral: Negative for behavioral problems and confusion.    Vital Signs: BP 122/78 mmHg  Pulse 78  Temp(Src) 97.8 F (36.6 C) (Oral)  Resp 20  Ht 6\' 1"  (1.854 m)  Wt 208 lb 8.9 oz (94.6 kg)  BMI 27.52 kg/m2  SpO2 93%  Physical Exam  Constitutional: He is  oriented to person, place, and time.  Cardiovascular: Normal rate and regular rhythm.   Pulmonary/Chest: Effort normal and breath sounds normal.  Abdominal: Soft. Bowel sounds are normal.  Musculoskeletal: Normal range of motion.  Neurological: He is alert and oriented to person, place, and time.  Skin: Skin is warm and dry.  Psychiatric: He has a normal mood and affect. His behavior is normal. Judgment and thought content normal.  Nursing note and vitals reviewed.   Mallampati Score:  MD Evaluation Airway: WNL Heart: WNL Abdomen: WNL Chest/ Lungs: WNL ASA  Classification: 3 Mallampati/Airway Score: One  Imaging: Ct Head Wo Contrast  07/28/2015  CLINICAL DATA:  Follow-up subdural hematoma. EXAM: CT HEAD WITHOUT CONTRAST TECHNIQUE: Contiguous axial images were obtained from the base of the skull through the vertex without intravenous contrast. COMPARISON:  Head CT 07/26/2015 FINDINGS: High-density subdural hematoma again noted along the LEFT cerebral convexity. Maximum depth of the hematoma equals 10 mm unchanged from 10 mm on comparison exam. Small volume subdural hematoma noted over the LEFT cerebral tentorium. There is no new intracranial hemorrhage. No intraventricular hemorrhage. Approximately 1-2 mm of rightward midline shift is noted. Mild dilatation of the ventricles is similar to comparison exam and proportional cortical atrophy. There is extensive periventricular white matter hypodensities. No skull fracture. IMPRESSION: 1. No change in volume of LEFT subdural hematoma. 2. Minimal 1 to 2 mm rightward midline shift. 3. No new intracranial hemorrhage. Electronically Signed   By: Genevive Bi M.D.   On: 07/28/2015 08:30   Ct Head Wo Contrast  07/26/2015  CLINICAL DATA:  Patient has a history of seizure, while at dialysis patient had a seizure today. Patient told staff he had a seizure yesterday, he altered and confused EXAM: CT HEAD WITHOUT CONTRAST TECHNIQUE: Contiguous axial  images were obtained from the base of the skull through the vertex without intravenous contrast. COMPARISON:  Head CT, 05/21/2015 and brain MRI, 06/21/2015. FINDINGS: Left-sided subdural hemorrhage appears slightly larger than it did on the prior MRI and head CT, measuring 1 cm in greatest thickness over the left parietal lobe. There is mild effacement of the underlying sulci and flattening of gyri, particularly over the left parietal lobe. There is no significant midline shift. No other intracranial hemorrhage. The ventricles are normal configuration. There is age related ventricular and sulcal enlargement. No hydrocephalus. There are no parenchymal masses. There is no evidence of a cortical infarct. Patchy white matter hypoattenuation is noted consistent with moderate to advanced chronic microvascular ischemic change. There are no extra-axial masses. Minimal dependent fluid or mucosal thickening is seen in the right maxillary sinus. Remaining sinuses are clear as are the mastoid air cells. IMPRESSION: 1. Slight increase in the size of the left subdural hematoma. This is consistent with a small amount of acute hemorrhage superimposed on a chronic subdural hemorrhage. Mild mass effect noted without midline shift. 2. No other acute findings.  No evidence of an ischemic infarct. 3. Age related volume loss and moderate to advanced chronic microvascular ischemic change. Electronically Signed   By: Amie Portland M.D.   On: 07/26/2015 12:48   Dg Chest Port 1 View  07/26/2015  CLINICAL DATA:  Seizure like activity during dialysis. Patient is confused and in a postictal state. EXAM: PORTABLE CHEST 1 VIEW COMPARISON:  10/01/2013 FINDINGS: The heart size and mediastinal contours are within normal limits. Lung volumes are low. Both lungs are clear. The visualized skeletal structures are unremarkable. IMPRESSION: 1. No acute findings. 2. Low lung volumes. Electronically Signed   By: Signa Kell M.D.   On: 07/26/2015  12:12    Labs:  CBC:  Recent Labs  06/20/15 0809 06/20/15 0810 06/23/15 1022 07/26/15 1210 07/27/15 0503  WBC 3.8*  --  3.8* 3.7* 3.0*  HGB 10.2* 11.6* 9.2* 12.9* 11.6*  HCT 31.5* 34.0* 28.0* 40.0 35.9*  PLT 89*  --  101* 107* 110*    COAGS:  Recent Labs  05/19/15 0920 07/26/15 1647  INR  --  1.08  APTT 33 30    BMP:  Recent Labs  06/22/15 0530 06/23/15 1023 07/26/15 1210 07/27/15 0503  NA 139 134* 134* 136  K 4.6 5.1 4.8 5.0  CL 99* 99* 98* 99*  CO2 28 24 24 26   GLUCOSE 149* 184* 149* 165*  BUN 54* 70* 75* 80*  CALCIUM 9.3 9.1 9.0 8.7*  CREATININE 8.22* 9.91* 8.37* 9.73*  GFRNONAA 5* 4* 5* 4*  GFRAA 6* 5* 6* 5*    LIVER FUNCTION TESTS:  Recent Labs  08/15/14 1059  06/20/15 1630 06/20/15 2318 06/23/15 1023 07/27/15 0503  BILITOT 0.4  --   --  0.7  --  0.8  AST 24  --   --  19  --  10*  ALT 15  --   --  15*  --  10*  ALKPHOS 56  --   --  43  --  44  PROT 7.1  --   --  7.1  --  6.4*  ALBUMIN 4.5  < > 3.7 4.1 3.5 3.4*  < > = values in this interval not displayed.  TUMOR MARKERS: No results for input(s): AFPTM, CEA, CA199, CHROMGRNA in the last 8760 hours.  Assessment and Plan:  ESRD Unsuccessful cannulation Rt arm graft 11/21 Cannot use thrombolytics secondary SDH---not candidate for declot Now scheduled for tunneled dialysis catheter placement in IR 11/22 Risks and Benefits discussed with the patient including, but not limited to bleeding, infection, vascular injury, pneumothorax which may require chest tube placement, air embolism or even death All of the patient's questions were answered, patient is agreeable to proceed. Consent signed and in chart.   Thank you for this interesting consult.  I greatly enjoyed meeting [name] and look forward to participating in their care.  A copy of this report was sent to the requesting provider on this date.  Signed: Rhylee Pucillo A 07/28/2015, 2:09 PM   I spent a total of 40 Minutes     in face to face in clinical consultation, greater than 50% of which was counseling/coordinating care for tunneled HD catheter placement

## 2015-07-28 NOTE — Progress Notes (Signed)
PROGRESS NOTE  [name] CXK:[id number] DOB: [birth year] DOA: 07/26/2015 PCP: [name], DO  Assessment/Plan: SDH (subdural hematoma) (HCC) -recurrent fall. Repeated head CT- stable in size PT/OT- home health  seizure disorder. Neurology consult: Vimpat is dialyzed during hemodialysis, and hence is the likely cause for his seizure after hemodialysis. add Depakote 250 mg twice a day to his regimen.  add a supplemental 50 mg of Vimpat immediately following the hemodialysis and continue with the current dose of 100 mg twice a day. Depakote is not removed with routine hemodialysis.   - will plan for dialysis here  and d/c after if seizure free  ESRD on hemodialysis. Nephrology consult for M/W/F dialysis  Anemia in chronic kidney disease  Thrombocytopenia (HCC) Continue close monitoring.  Diabetes type 2, controlled (HCC) Diet controlled. monitor CBG.   Code Status: full Family Communication: patient-able to reach daughter in law after no answer with son--- updated regarding medication changes and plan to dispo later today or tomm depending on dialysis Disposition Plan:    Consultants:  Neuro  neprho  Procedures:      HPI/Subjective: Doing well-- did not get enough food for breakfast   Objective: Filed Vitals:   07/28/15 1032 07/28/15 1215  BP: 136/63 140/72  Pulse: 69 69  Temp: 97.6 F (36.4 C) 97.7 F (36.5 C)  Resp: 15 16    Intake/Output Summary (Last 24 hours) at 07/28/15 1249 Last data filed at 07/28/15 0510  Gross per 24 hour  Intake    240 ml  Output    400 ml  Net   -160 ml   Filed Weights   07/26/15 1730 07/28/15 1215  Weight: 92.8 kg (204 lb 9.4 oz) 94.6 kg (208 lb 8.9 oz)    Exam:   General:  Awake, NAD  Cardiovascular: rrr  Respiratory: clear  Abdomen: +BS, soft  Musculoskeletal: no edema  Data Reviewed: Basic Metabolic Panel:  Recent Labs Lab 07/26/15 1210 07/27/15 0503  NA 134* 136  K 4.8 5.0  CL 98* 99*    CO2 24 26  GLUCOSE 149* 165*  BUN 75* 80*  CREATININE 8.37* 9.73*  CALCIUM 9.0 8.7*  MG 3.3*  --    Liver Function Tests:  Recent Labs Lab 07/27/15 0503  AST 10*  ALT 10*  ALKPHOS 44  BILITOT 0.8  PROT 6.4*  ALBUMIN 3.4*   No results for input(s): LIPASE, AMYLASE in the last 168 hours. No results for input(s): AMMONIA in the last 168 hours. CBC:  Recent Labs Lab 07/26/15 1210 07/27/15 0503  WBC 3.7* 3.0*  NEUTROABS 2.3 1.4*  HGB 12.9* 11.6*  HCT 40.0 35.9*  MCV 102.3* 102.0*  PLT 107* 110*   Cardiac Enzymes: No results for input(s): CKTOTAL, CKMB, CKMBINDEX, TROPONINI in the last 168 hours. BNP (last 3 results) No results for input(s): BNP in the last 8760 hours.  ProBNP (last 3 results) No results for input(s): PROBNP in the last 8760 hours.  CBG: No results for input(s): GLUCAP in the last 168 hours.  No results found for this or any previous visit (from the past 240 hour(s)).   Studies: Ct Head Wo Contrast  07/28/2015  CLINICAL DATA:  Follow-up subdural hematoma. EXAM: CT HEAD WITHOUT CONTRAST TECHNIQUE: Contiguous axial images were obtained from the base of the skull through the vertex without intravenous contrast. COMPARISON:  Head CT 07/26/2015 FINDINGS: High-density subdural hematoma again noted along the LEFT cerebral convexity. Maximum depth of the hematoma equals 10 mm unchanged from 10  mm on comparison exam. Small volume subdural hematoma noted over the LEFT cerebral tentorium. There is no new intracranial hemorrhage. No intraventricular hemorrhage. Approximately 1-2 mm of rightward midline shift is noted. Mild dilatation of the ventricles is similar to comparison exam and proportional cortical atrophy. There is extensive periventricular white matter hypodensities. No skull fracture. IMPRESSION: 1. No change in volume of LEFT subdural hematoma. 2. Minimal 1 to 2 mm rightward midline shift. 3. No new intracranial hemorrhage. Electronically Signed   By:  Genevive Bi M.D.   On: 07/28/2015 08:30    Scheduled Meds: . amLODipine  10 mg Oral Daily  . calcitRIOL  1 mcg Oral Q M,W,F-HD  . calcium acetate  2,001 mg Oral TID WC  . cinacalcet  60 mg Oral Q supper  . divalproex  250 mg Oral Q12H  . folic acid  1 mg Oral Daily  . gabapentin  100 mg Oral Daily  . lacosamide  100 mg Oral BID  . lacosamide  50 mg Oral Q M,W,F-HD  . pantoprazole  40 mg Oral QHS  . senna-docusate  1 tablet Oral BID   Continuous Infusions:  Antibiotics Given (last 72 hours)    None      Principal Problem:   SDH (subdural hematoma) (HCC) Active Problems:   Hypertension   history of AVM (arteriovenous malformation) of colon with hemorrhage   Anemia in chronic kidney disease   Thrombocytopenia (HCC)   Diabetes type 2, controlled (HCC)   ESRD on dialysis [facility])   Seizure disorder (HCC)   Word finding difficulty    Time spent: 25 min    Marykay Mccleod [facility]  [facility] Pager [phone]. If 7PM-7AM, please contact night-coverage at [email or web address], password [facility] 07/28/2015, 12:49 PM  LOS: 2 days

## 2015-07-28 NOTE — Progress Notes (Signed)
Unable to access AVF. Neph at beside. Pt returned to room.

## 2015-07-28 NOTE — Evaluation (Signed)
Speech Language Pathology Evaluation Patient Details Name: [name] MRN: [id number] DOB: 1932/03/20 Today's Date: 07/28/2015 Time: 1449- 1511     Problem List:  Patient Active Problem List   Diagnosis Date Noted  . Subdural hematoma (HCC) 07/26/2015  . Acute encephalopathy 06/21/2015  . Pancytopenia (HCC) 06/20/2015  . Bradycardia 06/20/2015  . Word finding difficulty 05/29/2015  . Polyneuropathy (HCC) 05/29/2015  . SDH (subdural hematoma) (HCC) 05/19/2015  . Laceration of scalp 05/19/2015  . Degenerative disc disease, lumbar 08/15/2014  . Seizure disorder (HCC) 10/17/2013  . Urinary incontinence 10/01/2013  . Other pancytopenia (HCC) 10/01/2013  . Failure to thrive in adult 10/01/2013  . ESRD on dialysis (HCC) 09/27/2013  . Secondary hyperparathyroidism, renal (HCC) 09/27/2013  . Gout flare 01/17/2013  . Anemia in chronic kidney disease 11/30/2012  . Thrombocytopenia (HCC) 11/30/2012  . Diabetes type 2, controlled (HCC) 11/30/2012  . Hypoglycemia associated with diabetes (HCC) 11/30/2012  . history of AVM (arteriovenous malformation) of colon with hemorrhage 11/29/2012  . Acute GI bleeding 10/02/2012  . Hypertension 10/02/2012   Past Medical History:  Past Medical History  Diagnosis Date  . Hypertension   . Diabetes mellitus without complication (HCC)   . Arthritis   . History of blood transfusion   . GI bleed   . ESRD (end stage renal disease) (HCC)   . Seizure (HCC)   . Dialysis patient (HCC)   . Subdural hematoma (HCC) 05/2015    from fall   Past Surgical History:  Past Surgical History  Procedure Laterality Date  . Esophagogastroduodenoscopy  10/03/2012    Procedure: ESOPHAGOGASTRODUODENOSCOPY (EGD);  Surgeon: Theda Belfast, MD;  Location: [facility] ENDOSCOPY;  Service: Endoscopy;  Laterality: N/A;  . Colonoscopy  10/04/2012    Procedure: COLONOSCOPY;  Surgeon: Theda Belfast, MD;  Location: [facility] ENDOSCOPY;  Service: Endoscopy;  Laterality: N/A;  . Colonoscopy  with esophagogastroduodenoscopy (egd) Left 11/30/2012    Procedure: COLONOSCOPY WITH ESOPHAGOGASTRODUODENOSCOPY (EGD);  Surgeon: Theda Belfast, MD;  Location: [facility] ENDOSCOPY;  Service: Endoscopy;  Laterality: Left;  . Spine surgery  2004    back fusion/ [facility] Penumalli,MD  . Retinal laser procedure Right 04/2015   HPI:  81 y.o. male with past medical history of hypertension, diabetes mellitus, recurrent fall, and ESRD on hemodialysis. He was admitted for recent subdural hematoma leading to seizure.   Assessment / Plan / Recommendation Clinical Impression  Patient presents with acute onset deficits in the areas of sustained attention/concentration and short term recall of information which spouse confirms as new since admission. SLP will f/u for treatment and to determine potential for needs after d/c. Spouse made aware that patient may require assistance that he did not before with high level cognitive tasks (ie money and medication management).     SLP Assessment  Patient needs continued Speech Lanaguage Pathology Services    Follow Up Recommendations   (TBD)    Frequency and Duration min 2x/week  1 week      SLP Evaluation Prior Functioning  Cognitive/Linguistic Baseline: Within functional limits Type of Home: House  Lives With: Spouse Available Help at Discharge: Family   Cognition  Overall Cognitive Status: Impaired/Different from baseline Arousal/Alertness: Awake/alert Orientation Level: Oriented to person;Oriented to place;Oriented to situation Attention: Sustained Sustained Attention: Impaired Sustained Attention Impairment: Verbal complex;Functional complex;Verbal basic Memory: Impaired Memory Impairment: Storage deficit;Retrieval deficit;Decreased short term memory Decreased Short Term Memory: Verbal basic;Functional basic Awareness: Impaired Awareness Impairment: Intellectual impairment Problem Solving: Impaired Problem Solving Impairment: Verbal complex  Comprehension  Auditory Comprehension Overall Auditory Comprehension: Appears within functional limits for tasks assessed (complex, multi-step directions impacted by attention) Visual Recognition/Discrimination Discrimination: Not tested Reading Comprehension Reading Status: Not tested    Expression Expression Primary Mode of Expression: Verbal Verbal Expression Overall Verbal Expression: Appears within functional limits for tasks assessed Written Expression Dominant Hand: Right   Oral / Motor Oral Motor/Sensory Function Overall Oral Motor/Sensory Function: Within functional limits   Andrew Lango MA, CCC-SLP [phone]  [name] [name] 07/28/2015, 3:20 PM

## 2015-07-28 NOTE — Care Management Note (Addendum)
Case Management Note  Patient Details  Name: Josef Tourigny MRN: [id number] Date of Birth: 1932/05/29  Subjective/Objective:                    Action/Plan: Patient has orders for home health services at discharge. CM met with the patient and his wife and provided them a list of home health agencies in the [facility] area. Patient states he recently has home health at his house but does not remember if it was [facility] or [facility]. CM spoke with Miranda at [facility] and they have not seen the patient recently. CM left a message with Stanton Kidney at [facility] and will continue to follow up for his discharge needs.   Addendum: CM spoke with Stanton Kidney at [facility] and the patient is active with them. Stanton Kidney accepted the resumption of care at discharge for PT/OT/RN.   Expected Discharge Date:                  Expected Discharge Plan:  [place]  In-House Referral:     Discharge planning Services  CM Consult  Post Acute Care Choice:  Home Health Choice offered to:  Patient, Spouse  DME Arranged:    DME Agency:     HH Arranged:  RN, PT, OT HH Agency:  [facility]  Status of Service:  In process, will continue to follow  Medicare Important Message Given:    Date Medicare IM Given:    Medicare IM give by:    Date Additional Medicare IM Given:    Additional Medicare Important Message give by:     If discussed at [place] of Stay Meetings, dates discussed:    Additional Comments:  Pollie Friar, RN 07/28/2015, 3:58 PM

## 2015-07-28 NOTE — Progress Notes (Signed)
Patient ID: [name], male   DOB: 23-Jul-1932, 81 y.o.   MRN: [id number] Stable, neuro normal. Ct unchange. No more bleeding. Can be discharge BUT he and family to be aware of chi and falls

## 2015-07-28 NOTE — Progress Notes (Addendum)
Patient ID: [name], male   DOB: Oct 14, 1931, 81 y.o.   MRN: [id number]  [facility] Progress Note    Subjective:   Feels better today   Objective:   BP 136/63 mmHg  Pulse 69  Temp(Src) 97.6 F (36.4 C) (Oral)  Resp 15  Ht 6\' 1"  (1.854 m)  Wt 92.8 kg (204 lb 9.4 oz)  BMI 27.00 kg/m2  SpO2 100%  Intake/Output: I/O last 3 completed shifts: In: 240 [P.O.:240] Out: 850 [Urine:850]   Intake/Output this shift:    Weight change:   Physical Exam: Gen:WD WN AAM in NAD CVS:no rub Resp:cta HXT:AVWPVX edema, RAVF +T/B  Labs: BMET  Recent Labs Lab 07/26/15 1210 07/27/15 0503  NA 134* 136  K 4.8 5.0  CL 98* 99*  CO2 24 26  GLUCOSE 149* 165*  BUN 75* 80*  CREATININE 8.37* 9.73*  ALBUMIN  --  3.4*  CALCIUM 9.0 8.7*   CBC  Recent Labs Lab 07/26/15 1210 07/27/15 0503  WBC 3.7* 3.0*  NEUTROABS 2.3 1.4*  HGB 12.9* 11.6*  HCT 40.0 35.9*  MCV 102.3* 102.0*  PLT 107* 110*    @IMGRELPRIORS @ Medications:    . amLODipine  10 mg Oral Daily  . calcitRIOL  1 mcg Oral Q M,W,F-HD  . calcium acetate  2,001 mg Oral TID WC  . cinacalcet  60 mg Oral Q supper  . divalproex  250 mg Oral Q12H  . folic acid  1 mg Oral Daily  . gabapentin  100 mg Oral Daily  . lacosamide  100 mg Oral BID  . lacosamide  50 mg Oral Q M,W,F-HD  . pantoprazole  40 mg Oral QHS  . senna-docusate  1 tablet Oral BID    [facility] MWF 3h 07/29/15 88.5kg 2/2.25 bath Heparin none RUA AVF Venofer 50/wk Calcitriol 0.50 ug tiw Mircera 200 q2 due 11/23 Hb 10.9  Assessment/ Plan:   1. Fall/acute on chronic subdural hematoma- continue to monitor, neuro and neurosurgery involved.  Conservative therapy for now.  No heparin with HD 2. Sz d/o- additional dose of Vimpat added per Neuro 3. ESRD: cont with MWF schedule and hopeful discharge and f/u with outpt unit soon.  No heparin with HD 4. Anemia: ESA on hold  5. CKD-MBD:stable, cont with binders/vit D 6. Nutrition: per  primary 7. Hypertension:stable 8. Dispo- hopeful discharge soon  Sula Fetterly A 07/28/2015, 12:20 PM   Patient was seen on dialysis and the procedure was supervised. However only arterial needle was able to flush and withdraw, unsuccessful cannulation attempts x 5 above for venous, will send to IR for fistulogram so we can proceed with HD.  No thrombolytics due to SDH.

## 2015-07-28 NOTE — Progress Notes (Signed)
Occupational Therapy Evaluation Patient Details Name: [name] MRN: [id number] DOB: 1932/08/19 Today's Date: 07/28/2015    History of Present Illness 81 y.o. male with past medical history of hypertension, diabetes mellitus, recurrent fall, and ESRD on hemodialysis. He was admitted for recent subdural hematoma leading to seizure.   Clinical Impression   PTA, pt required supervision with ADL and mobility at RW level. Pt will benefit from continued acute OT services to address below listed deficits and maximize independence with ADL prior to d/c home. Pt is currently min assist with mobility and ADL. Pt has apparent cognitive deficits and is a high fall risk. As a result, pt needs supervision level assistance for all functional mobility and ADL. Recommend HHOT upon d/c.     Follow Up Recommendations  Home health OT    Equipment Recommendations  None recommended by OT    Recommendations for Other Services       Precautions / Restrictions Precautions Precautions: Fall Restrictions Weight Bearing Restrictions: No      Mobility Bed Mobility Overal bed mobility: Modified Independent             General bed mobility comments: use of bed rails. Significant posterior lean while sitting EOB.  Transfers Overall transfer level: Needs assistance Equipment used: Rolling walker (2 wheeled) Transfers: Sit to/from Stand Sit to Stand: Min guard         General transfer comment: verbal cues for sequencing and safety. Posterior lean upon standing.    Balance Overall balance assessment: Needs assistance Sitting-balance support: Feet supported;Single extremity supported Sitting balance-Leahy Scale: Fair Sitting balance - Comments: Pt unable to maintain upright sitting posture when engaged in simple task. Pt with significant posterior lean and required min assist to regain balance.  Postural control: Posterior lean Standing balance support: Bilateral upper extremity  supported Standing balance-Leahy Scale: Poor Standing balance comment: Pt with posterior lean upon standing. Pt reported that when he has fallen, he has typically fallen backwards                            ADL Overall ADL's : Needs assistance/impaired                         Toilet Transfer: Minimal assistance   Toileting- Clothing Manipulation and Hygiene: Minimal assistance       Functional mobility during ADLs: Min guard General ADL Comments: Pt requires supervision during all mobility and ADL.      Vision Vision Assessment?: Yes Eye Alignment: Within Functional Limits Ocular Range of Motion: Within Functional Limits Alignment/Gaze Preference: Within Defined Limits Tracking/Visual Pursuits: Able to track stimulus in all quads without difficulty Convergence: Within functional limits   Perception     Praxis      Pertinent Vitals/Pain Pain Assessment: No/denies pain     Hand Dominance Right   Extremity/Trunk Assessment Upper Extremity Assessment Upper Extremity Assessment: Generalized weakness   Lower Extremity Assessment Lower Extremity Assessment: Defer to PT evaluation   Cervical / Trunk Assessment Cervical / Trunk Assessment: Kyphotic   Communication Communication Communication: No difficulties   Cognition Arousal/Alertness: Awake/alert Behavior During Therapy: WFL for tasks assessed/performed Overall Cognitive Status: Impaired/Different from baseline Area of Impairment: Attention;Memory;Safety/judgement;Awareness     Memory: Decreased short-term memory   Safety/Judgement: Decreased awareness of deficits;Decreased awareness of safety Awareness: Anticipatory   General Comments: Pt has a hx of cognitive impairments, but no family present to verfiy if  he is currently functioning at baseline. Pt has significant deficits in attention, insight, judgment, and has decreased awareness of safety causing him to be a high fall risk    General Comments       Exercises       Shoulder Instructions      Home Living Family/patient expects to be discharged to:: Private residence Living Arrangements: Spouse/significant other Available Help at Discharge: Family;Available 24 hours/day Type of Home: House Home Access: Ramped entrance     Home Layout: Two level Alternate Level Stairs-Number of Steps: chair lift   Bathroom Shower/Tub: Walk-in shower   Bathroom Toilet: Handicapped height     Home Equipment: Environmental consultant - 2 wheels;Bedside commode;Grab bars - toilet;Grab bars - tub/shower;Cane - single point;Shower seat          Prior Functioning/Environment Level of Independence: Needs assistance    ADL's / Homemaking Assistance Needed: with RW or SPC        OT Diagnosis: Generalized weakness;Cognitive deficits   OT Problem List: Decreased strength;Decreased range of motion;Decreased activity tolerance;Impaired balance (sitting and/or standing);Decreased coordination;Decreased safety awareness;Decreased knowledge of use of DME or AE   OT Treatment/Interventions: Self-care/ADL training;DME and/or AE instruction;Therapeutic activities;Cognitive remediation/compensation;Patient/family education;Balance training    OT Goals(Current goals can be found in the care plan section) Acute Rehab OT Goals Patient Stated Goal: to go home OT Goal Formulation: With patient Time For Goal Achievement: 08/11/15 Potential to Achieve Goals: Good ADL Goals Pt Will Perform Upper Body Bathing: with supervision;sitting Pt Will Perform Lower Body Bathing: with supervision;sit to/from stand Pt Will Perform Lower Body Dressing: with supervision;sit to/from stand Pt Will Transfer to Toilet: with supervision;ambulating;bedside commode;grab bars Pt Will Perform Toileting - Clothing Manipulation and hygiene: with supervision;sit to/from stand  OT Frequency: Min 2X/week   Barriers to D/C:            Co-evaluation               End of Session Equipment Utilized During Treatment: Rolling walker;Gait belt Nurse Communication: Mobility status  Activity Tolerance: Patient tolerated treatment well Patient left: in chair;with call bell/phone within reach;with chair alarm set   Time: 1124-1150 OT Time Calculation (min): 26 min Charges:  OT General Charges $OT Visit: 1 Procedure OT Evaluation $Initial OT Evaluation Tier I: 1 Procedure OT Treatments $Self Care/Home Management : 8-22 mins G-Codes:    Nils Pyle, OTR/L 08-14-15, 2:15 PM

## 2015-07-29 ENCOUNTER — Inpatient Hospital Stay (HOSPITAL_COMMUNITY): Payer: Medicare Other

## 2015-07-29 DIAGNOSIS — D631 Anemia in chronic kidney disease: Secondary | ICD-10-CM

## 2015-07-29 DIAGNOSIS — N189 Chronic kidney disease, unspecified: Secondary | ICD-10-CM

## 2015-07-29 LAB — RENAL FUNCTION PANEL
ALBUMIN: 3.6 g/dL (ref 3.5–5.0)
ANION GAP: 13 (ref 5–15)
BUN: 103 mg/dL — ABNORMAL HIGH (ref 6–20)
CHLORIDE: 101 mmol/L (ref 101–111)
CO2: 23 mmol/L (ref 22–32)
Calcium: 8.6 mg/dL — ABNORMAL LOW (ref 8.9–10.3)
Creatinine, Ser: 11.63 mg/dL — ABNORMAL HIGH (ref 0.61–1.24)
GFR calc Af Amer: 4 mL/min — ABNORMAL LOW (ref >60)
GFR calc non Af Amer: 4 mL/min — ABNORMAL LOW (ref >60)
GLUCOSE: 145 mg/dL — AB (ref 65–99)
PHOSPHORUS: 5.2 mg/dL — AB (ref 2.5–4.6)
POTASSIUM: 6 mmol/L — AB (ref 3.5–5.1)
Sodium: 137 mmol/L (ref 135–145)

## 2015-07-29 LAB — BASIC METABOLIC PANEL
ANION GAP: 12 (ref 5–15)
BUN: 102 mg/dL — AB (ref 6–20)
CALCIUM: 8.9 mg/dL (ref 8.9–10.3)
CO2: 24 mmol/L (ref 22–32)
Chloride: 100 mmol/L — ABNORMAL LOW (ref 101–111)
Creatinine, Ser: 11.61 mg/dL — ABNORMAL HIGH (ref 0.61–1.24)
GFR calc Af Amer: 4 mL/min — ABNORMAL LOW (ref >60)
GFR, EST NON AFRICAN AMERICAN: 4 mL/min — AB (ref >60)
GLUCOSE: 118 mg/dL — AB (ref 65–99)
Potassium: 5.9 mmol/L — ABNORMAL HIGH (ref 3.5–5.1)
Sodium: 136 mmol/L (ref 135–145)

## 2015-07-29 LAB — CBC
HEMATOCRIT: 36.8 % — AB (ref 39.0–52.0)
HEMATOCRIT: 36.5 % — AB (ref 39.0–52.0)
HEMOGLOBIN: 12.1 g/dL — AB (ref 13.0–17.0)
Hemoglobin: 11.9 g/dL — ABNORMAL LOW (ref 13.0–17.0)
MCH: 32.5 pg (ref 26.0–34.0)
MCH: 33.7 pg (ref 26.0–34.0)
MCHC: 32.3 g/dL (ref 30.0–36.0)
MCHC: 33.2 g/dL (ref 30.0–36.0)
MCV: 100.5 fL — AB (ref 78.0–100.0)
MCV: 101.7 fL — AB (ref 78.0–100.0)
PLATELETS: 96 10*3/uL — AB (ref 150–400)
Platelets: 89 10*3/uL — ABNORMAL LOW (ref 150–400)
RBC: 3.66 MIL/uL — ABNORMAL LOW (ref 4.22–5.81)
RBC: 3.59 MIL/uL — ABNORMAL LOW (ref 4.22–5.81)
RDW: 16.3 % — AB (ref 11.5–15.5)
RDW: 16.7 % — ABNORMAL HIGH (ref 11.5–15.5)
WBC: 3.2 10*3/uL — AB (ref 4.0–10.5)
WBC: 3.5 10*3/uL — AB (ref 4.0–10.5)

## 2015-07-29 MED ORDER — DEXTROSE 50 % IV SOLN
1.0000 | Freq: Once | INTRAVENOUS | Status: AC
  Administered 2015-07-29: 50 mL via INTRAVENOUS

## 2015-07-29 MED ORDER — HEPARIN SODIUM (PORCINE) 1000 UNIT/ML IJ SOLN
INTRAMUSCULAR | Status: AC
  Filled 2015-07-29: qty 1

## 2015-07-29 MED ORDER — LACOSAMIDE 50 MG PO TABS
50.0000 mg | ORAL_TABLET | Freq: Once | ORAL | Status: AC
  Administered 2015-07-29: 50 mg via ORAL
  Filled 2015-07-29: qty 1

## 2015-07-29 MED ORDER — GELATIN ABSORBABLE 12-7 MM EX MISC
CUTANEOUS | Status: AC
  Filled 2015-07-29: qty 1

## 2015-07-29 MED ORDER — ATROPINE SULFATE 0.1 MG/ML IJ SOLN
INTRAMUSCULAR | Status: AC
  Filled 2015-07-29: qty 10

## 2015-07-29 MED ORDER — CEFAZOLIN SODIUM-DEXTROSE 2-3 GM-% IV SOLR
2.0000 g | INTRAVENOUS | Status: AC
  Administered 2015-07-29: 2 g via INTRAVENOUS
  Filled 2015-07-29: qty 50

## 2015-07-29 MED ORDER — SODIUM CHLORIDE 0.9 % IV SOLN
1.0000 g | Freq: Once | INTRAVENOUS | Status: DC
  Filled 2015-07-29: qty 10

## 2015-07-29 MED ORDER — CEFAZOLIN SODIUM-DEXTROSE 2-3 GM-% IV SOLR
INTRAVENOUS | Status: AC
  Administered 2015-07-29: 2000 mg
  Filled 2015-07-29: qty 50

## 2015-07-29 MED ORDER — INSULIN ASPART 100 UNIT/ML IV SOLN
10.0000 [IU] | Freq: Once | INTRAVENOUS | Status: AC
  Administered 2015-07-29: 10 [IU] via INTRAVENOUS

## 2015-07-29 MED ORDER — LIDOCAINE HCL 1 % IJ SOLN
INTRAMUSCULAR | Status: AC
  Filled 2015-07-29: qty 20

## 2015-07-29 NOTE — Progress Notes (Signed)
Patient placed on CPAP for the night. Tolerating well at this time.

## 2015-07-29 NOTE — Procedures (Signed)
Interventional Radiology Procedure Note  Procedure: Placement of a Right IJ 28 cm Palindrome tunneled HD catheter with the catheter tip in the mid RA.  Catheter is ready for use.  Complications: None  Estimated Blood Loss: 0  Recommendations:   - Routine line care  Signed,  Sterling Big, MD

## 2015-07-29 NOTE — Progress Notes (Signed)
PT Cancellation Note  Patient Details Name: Siris Hoos MRN: [id number] DOB: [birth year]   Cancelled Treatment:    Reason Eval/Treat Not Completed: Patient at procedure or test/unavailable. Pt continues to be off unit for HD. Will check back as schedule allows to continue PT plan of care.    Conni Slipper 07/29/2015, 1:25 PM   Conni Slipper, PT, DPT Acute Rehabilitation Services Pager: [phone]

## 2015-07-29 NOTE — Progress Notes (Addendum)
Patient with HR in the mid 30's this morning. Lowest was 34. Junctional rhythm at one point. MD paged. Patient asymptomatic. On CPAP at this time. Monitoring closely. Djibril Glogowski, [place] Scrape, RN   EKG completed. Reading "Abnormal: Junctional bradycardia." Unconfirmed paper copy in pt chart.

## 2015-07-29 NOTE — Discharge Summary (Addendum)
Physician Discharge Summary  Sabin Gibeault XTK:[id number] DOB: 09-19-1931 DOA: 07/26/2015  PCP: Hollace Kinnier, DO  Admit date: 07/26/2015 Discharge date: 07/29/2015  Time spent: 35 minutes  Recommendations for Outpatient Follow-up:  Outpatient vascular access- Dr. Marval Regal to arrange follow up Resume home health Per family sharp decline in health over last 6 months-- ? Outpatient hospice/palliative referral-- patient does not seem to tolerate dialysis well dialysis in AM  Discharge Diagnoses:  Principal Problem:   SDH (subdural hematoma) (HCC) Active Problems:   Hypertension   history of AVM (arteriovenous malformation) of colon with hemorrhage   Anemia in chronic kidney disease   Thrombocytopenia (HCC)   Diabetes type 2, controlled ([place])   ESRD on dialysis ([place])   Seizure disorder ([place])   Word finding difficulty   Discharge Condition: stable  Diet recommendation: renal  Filed Weights   07/26/15 1730 07/28/15 1215  Weight: 92.8 kg (204 lb 9.4 oz) 94.6 kg (208 lb 8.9 oz)    History of present illness:  [name] is a 81 y.o. male with Past medical history of hypertension, diabetes mellitus, recurrent fall, recent subdural hematoma leading to seizure on Vimpat, ESRD on hemodialysis Tuesday Thursday Saturday. Patient presents with complaints of seizure episode. Next and patient was at the dialysis center while on dialysis is started having episodes of seizure. The patient does not remember any information after arriving at the dialysis center. At the time of my evaluation patient denies any complaints of headache dizziness lightheadedness vision changes speech difficulty chest and abdominal pain nausea vomiting diarrhea or focal deficit. Next and patient mentions he is compliant with all his medication. Family reports patient had a recent falls but does not know when. Patient did not have any episodes of seizures since his recent hospitalization. No diarrhea or  constipation. No recent change in medications reported.  The patient is coming from home.  At his baseline ambulates with support And is independent for most of his ADL; manages his medication on his own.   Hospital Course:  SDH (subdural hematoma) (HCC) -recurrent fall. Repeated head CT- stable in size- per NS no further work up PT/OT- home health  seizure disorder. Neurology consult: Vimpat is dialyzed during hemodialysis, and hence is the likely cause for his seizure after hemodialysis. add Depakote 250 mg twice a day to his regimen.  add a supplemental 50 mg of Vimpat immediately following the hemodialysis and continue with the current dose of 100 mg twice a day. Depakote is not removed with routine hemodialysis.  - will plan for dialysis here and d/c after since seizure free  ESRD on hemodialysis. Nephrology consult for M/W/F dialysis - monitor BP As this could be going low and causing Anemia in chronic kidney disease  Thrombocytopenia (HCC) Continue close monitoring.  Diabetes type 2, controlled ([place]) Diet controlled. monitor CBG.  Agitation -spoke with daughter in law, Colletta [place], who stated that about the 3rd day in the hospital, patient starts to get very agitated  Procedures: tunneled hemodialysis catheter placement  Consultations:  nephro  Discharge Exam: Filed Vitals:   07/29/15 1159 07/29/15 1227  BP: 106/52 123/84  Pulse: 81 79  Temp:    Resp:      General: says he just wants to go home   Discharge Instructions    Current Discharge Medication List    START taking these medications   Details  divalproex (DEPAKOTE) 250 MG DR tablet Take 1 tablet (250 mg total) by mouth every 12 (twelve) hours. Qty: 60 tablet,  Refills: 0    !! lacosamide (VIMPAT) 50 MG TABS tablet Take 1 tablet (50 mg total) by mouth every Monday, Wednesday, and Friday with hemodialysis. Qty: 15 tablet, Refills: 1     !! - Potential duplicate medications found. Please  discuss with provider.    CONTINUE these medications which have NOT CHANGED   Details  amLODipine (NORVASC) 10 MG tablet Take 10 mg by mouth Daily.     calcitRIOL (ROCALTROL) 0.5 MCG capsule Take 2 capsules (1 mcg total) by mouth every Monday, Wednesday, and Friday with hemodialysis.    calcium acetate (PHOSLO) 667 MG capsule Take 3 capsules by mouth three times a day Qty: 180 capsule, Refills: 3    cinacalcet (SENSIPAR) 30 MG tablet Take 2 tablets (60 mg total) by mouth daily with supper. Qty: 30 tablet, Refills: 0    colchicine 0.6 MG tablet Take 0.6 mg by mouth as needed.     cyclobenzaprine (FLEXERIL) 5 MG tablet Take one tablet by mouth once daily as needed for spasms Qty: 30 tablet, Refills: 0    fexofenadine (ALLEGRA) 180 MG tablet Take 180 mg by mouth daily. As needed    folic acid (FOLVITE) 1 MG tablet Take 1 tablet (1 mg total) by mouth daily. Qty: 30 tablet, Refills: 0    gabapentin (NEURONTIN) 100 MG capsule Take 1 capsule (100 mg total) by mouth daily. Qty: 30 capsule, Refills: 0   Associated Diagnoses: Degenerative disc disease, lumbar    glucose blood (ACCU-CHEK AVIVA PLUS) test strip Check blood sugar three times daily Qty: 100 each, Refills: 3   Associated Diagnoses: Controlled type 2 diabetes mellitus with chronic kidney disease on chronic dialysis, with long-term current use of insulin (HCC)    HUMALOG KWIKPEN 100 UNIT/ML KiwkPen Sliding Scale Refills: 0    !! Lacosamide 100 MG TABS Take 1 tablet (100 mg total) by mouth 2 (two) times daily. Qty: 60 tablet, Refills: 3   Associated Diagnoses: Secondary seizure disorder (HCC)    multivitamin (RENA-VIT) TABS tablet Take 1 tablet by mouth at bedtime. Qty: 30 tablet, Refills: 0    senna-docusate (SENOKOT-S) 8.6-50 MG per tablet Take 1 tablet by mouth. As needed    triamcinolone (NASACORT ALLERGY 24HR) 55 MCG/ACT AERO nasal inhaler Place 1 spray into the nose. One spray nasal as needed for allery    BESIVANCE  0.6 % SUSP Place 1 drop into the right eye 4 (four) times daily as needed. For 2 days after each monthly injection Refills: 1    darbepoetin (ARANESP) 60 MCG/0.3ML SOLN injection Inject 0.3 mLs (60 mcg total) into the vein every Thursday with hemodialysis. Qty: 4.2 mL    sodium chloride 0.9 % SOLN 100 mL with ferric gluconate 12.5 MG/ML SOLN 62.5 mg Inject 62.5 mg into the vein every Thursday with hemodialysis.     !! - Potential duplicate medications found. Please discuss with provider.     Allergies  Allergen Reactions  . Aspirin Other (See Comments)    Bleeding   . Lactose Intolerance (Gi) Diarrhea   Follow-up Information    Follow up with [name], [name], DO In 1 week.   Specialty:  Geriatric Medicine   Contact information:   [place]. [place] [address] [phone]       Follow up with Andrey Spearman, MD In 1 month.   Specialties:  Neurology, Radiology   Why:  seizure follow up   Contact information:   [address] [place] [address] [phone]  The results of significant diagnostics from this hospitalization (including imaging, microbiology, ancillary and laboratory) are listed below for reference.    Significant Diagnostic Studies: Ct Head Wo Contrast  07/28/2015  CLINICAL DATA:  Follow-up subdural hematoma. EXAM: CT HEAD WITHOUT CONTRAST TECHNIQUE: Contiguous axial images were obtained from the base of the skull through the vertex without intravenous contrast. COMPARISON:  Head CT 07/26/2015 FINDINGS: High-density subdural hematoma again noted along the LEFT cerebral convexity. Maximum depth of the hematoma equals 10 mm unchanged from 10 mm on comparison exam. Small volume subdural hematoma noted over the LEFT cerebral tentorium. There is no new intracranial hemorrhage. No intraventricular hemorrhage. Approximately 1-2 mm of rightward midline shift is noted. Mild dilatation of the ventricles is similar to comparison exam and  proportional cortical atrophy. There is extensive periventricular white matter hypodensities. No skull fracture. IMPRESSION: 1. No change in volume of LEFT subdural hematoma. 2. Minimal 1 to 2 mm rightward midline shift. 3. No new intracranial hemorrhage. Electronically Signed   By: Suzy Bouchard M.D.   On: 07/28/2015 08:30   Ct Head Wo Contrast  07/26/2015  CLINICAL DATA:  Patient has a history of seizure, while at dialysis patient had a seizure today. Patient told staff he had a seizure yesterday, he altered and confused EXAM: CT HEAD WITHOUT CONTRAST TECHNIQUE: Contiguous axial images were obtained from the base of the skull through the vertex without intravenous contrast. COMPARISON:  Head CT, 05/21/2015 and brain MRI, 06/21/2015. FINDINGS: Left-sided subdural hemorrhage appears slightly larger than it did on the prior MRI and head CT, measuring 1 cm in greatest thickness over the left parietal lobe. There is mild effacement of the underlying sulci and flattening of gyri, particularly over the left parietal lobe. There is no significant midline shift. No other intracranial hemorrhage. The ventricles are normal configuration. There is age related ventricular and sulcal enlargement. No hydrocephalus. There are no parenchymal masses. There is no evidence of a cortical infarct. Patchy white matter hypoattenuation is noted consistent with moderate to advanced chronic microvascular ischemic change. There are no extra-axial masses. Minimal dependent fluid or mucosal thickening is seen in the right maxillary sinus. Remaining sinuses are clear as are the mastoid air cells. IMPRESSION: 1. Slight increase in the size of the left subdural hematoma. This is consistent with a small amount of acute hemorrhage superimposed on a chronic subdural hemorrhage. Mild mass effect noted without midline shift. 2. No other acute findings.  No evidence of an ischemic infarct. 3. Age related volume loss and moderate to advanced  chronic microvascular ischemic change. Electronically Signed   By: Lajean Manes M.D.   On: 07/26/2015 12:48   Ir Fluoro Guide Cv Line Right  07/29/2015  INDICATION: 81 year old male with thrombosed native arteriovenous fistula in the setting of a subdural hematoma which precludes thrombolysis. He requires placement of a durable hemodialysis catheter access for continued hemodialysis. EXAM: TUNNELED CENTRAL VENOUS HEMODIALYSIS CATHETER PLACEMENT WITH ULTRASOUND AND FLUOROSCOPIC GUIDANCE MEDICATIONS: Ancef 2 gm IV; The IV antibiotic was given in an appropriate time interval prior to skin puncture. CONTRAST:  None ANESTHESIA/SEDATION: None administered FLUOROSCOPY TIME:  12 seconds for a total of 5.2 mGy COMPLICATIONS: None immediate PROCEDURE: Informed written consent was obtained from the patient after a discussion of the risks, benefits, and alternatives to treatment. Questions regarding the procedure were encouraged and answered. The right neck and chest were prepped with chlorhexidine in a sterile fashion, and a sterile drape was applied covering the operative field. Maximum barrier  sterile technique with sterile gowns and gloves were used for the procedure. A timeout was performed prior to the initiation of the procedure. After creating a small venotomy incision, a micropuncture kit was utilized to access the right internal jugular vein under direct, real-time ultrasound guidance after the overlying soft tissues were anesthetized with 1% lidocaine with epinephrine. Ultrasound image documentation was performed. The microwire was kinked to measure appropriate catheter length. A stiff glidewire was advanced to the level of the IVC and the micropuncture sheath was exchanged for a peel-away sheath. A Palindrome tunneled hemodialysis catheter measuring 28 cm from tip to cuff was tunneled in a retrograde fashion from the anterior chest wall to the venotomy incision. The catheter was then placed through the  peel-away sheath with tips ultimately positioned within the mid right atrium. Final catheter positioning was confirmed and documented with a spot radiographic image. The catheter aspirates and flushes normally. The catheter was flushed with appropriate volume heparin dwells. The catheter exit site was secured with a 0-Prolene retention suture. The venotomy incision was closed with an interrupted 4-0 Vicryl, and the epidermis sealed with Dermabond. Dressings were applied. The patient tolerated the procedure well without immediate post procedural complication. IMPRESSION: Successful placement of a 28 cm tip to cuff tunneled hemodialysis catheter via the right internal jugular vein with tips terminating within the mid right atrium. The catheter is ready for immediate use. Signed, Criselda Peaches, MD [facility] Radiology Electronically Signed   By: Jacqulynn Cadet M.D.   On: 07/29/2015 10:26   Ir US Guide Vasc Access Right  07/29/2015  INDICATION: 81 year old male with thrombosed native arteriovenous fistula in the setting of a subdural hematoma which precludes thrombolysis. He requires placement of a durable hemodialysis catheter access for continued hemodialysis. EXAM: TUNNELED CENTRAL VENOUS HEMODIALYSIS CATHETER PLACEMENT WITH ULTRASOUND AND FLUOROSCOPIC GUIDANCE MEDICATIONS: Ancef 2 gm IV; The IV antibiotic was given in an appropriate time interval prior to skin puncture. CONTRAST:  None ANESTHESIA/SEDATION: None administered FLUOROSCOPY TIME:  12 seconds for a total of 5.2 mGy COMPLICATIONS: None immediate PROCEDURE: Informed written consent was obtained from the patient after a discussion of the risks, benefits, and alternatives to treatment. Questions regarding the procedure were encouraged and answered. The right neck and chest were prepped with chlorhexidine in a sterile fashion, and a sterile drape was applied covering the operative field. Maximum  barrier sterile technique with sterile gowns and gloves were used for the procedure. A timeout was performed prior to the initiation of the procedure. After creating a small venotomy incision, a micropuncture kit was utilized to access the right internal jugular vein under direct, real-time ultrasound guidance after the overlying soft tissues were anesthetized with 1% lidocaine with epinephrine. Ultrasound image documentation was performed. The microwire was kinked to measure appropriate catheter length. A stiff glidewire was advanced to the level of the IVC and the micropuncture sheath was exchanged for a peel-away sheath. A Palindrome tunneled hemodialysis catheter measuring 28 cm from tip to cuff was tunneled in a retrograde fashion from the anterior chest wall to the venotomy incision. The catheter was then placed through the peel-away sheath with tips ultimately positioned within the mid right atrium. Final catheter positioning was confirmed and documented with a spot radiographic image. The catheter aspirates and flushes normally. The catheter was flushed with appropriate volume heparin dwells. The catheter exit site was secured with a 0-Prolene retention suture. The venotomy incision was closed with an interrupted 4-0 Vicryl, and  the epidermis sealed with Dermabond. Dressings were applied. The patient tolerated the procedure well without immediate post procedural complication. IMPRESSION: Successful placement of a 28 cm tip to cuff tunneled hemodialysis catheter via the right internal jugular vein with tips terminating within the mid right atrium. The catheter is ready for immediate use. Signed, Criselda Peaches, MD [facility] Radiology Electronically Signed   By: Jacqulynn Cadet M.D.   On: 07/29/2015 10:26   Dg Chest Port 1 View  07/26/2015  CLINICAL DATA:  Seizure like activity during dialysis. Patient is confused and in a postictal state. EXAM:  PORTABLE CHEST 1 VIEW COMPARISON:  10/01/2013 FINDINGS: The heart size and mediastinal contours are within normal limits. Lung volumes are low. Both lungs are clear. The visualized skeletal structures are unremarkable. IMPRESSION: 1. No acute findings. 2. Low lung volumes. Electronically Signed   By: Kerby Moors M.D.   On: 07/26/2015 12:12    Microbiology: No results found for this or any previous visit (from the past 240 hour(s)).   Labs: Basic Metabolic Panel:  Recent Labs Lab 07/26/15 1210 07/27/15 0503 07/29/15 0220 07/29/15 0930  NA 134* 136 136 137  K 4.8 5.0 5.9* 6.0*  CL 98* 99* 100* 101  CO2 24 26 24 23   GLUCOSE 149* 165* 118* 145*  BUN 75* 80* 102* 103*  CREATININE 8.37* 9.73* 11.61* 11.63*  CALCIUM 9.0 8.7* 8.9 8.6*  MG 3.3*  --   --   --   PHOS  --   --   --  5.2*   Liver Function Tests:  Recent Labs Lab 07/27/15 0503 07/29/15 0930  AST 10*  --   ALT 10*  --   ALKPHOS 44  --   BILITOT 0.8  --   PROT 6.4*  --   ALBUMIN 3.4* 3.6   No results for input(s): LIPASE, AMYLASE in the last 168 hours. No results for input(s): AMMONIA in the last 168 hours. CBC:  Recent Labs Lab 07/26/15 1210 07/27/15 0503 07/29/15 0220 07/29/15 0951  WBC 3.7* 3.0* 3.2* 3.5*  NEUTROABS 2.3 1.4*  --   --   HGB 12.9* 11.6* 11.9* 12.1*  HCT 40.0 35.9* 36.8* 36.5*  MCV 102.3* 102.0* 100.5* 101.7*  PLT 107* 110* 96* 89*   Cardiac Enzymes: No results for input(s): CKTOTAL, CKMB, CKMBINDEX, TROPONINI in the last 168 hours. BNP: BNP (last 3 results) No results for input(s): BNP in the last 8760 hours.  ProBNP (last 3 results) No results for input(s): PROBNP in the last 8760 hours.  CBG: No results for input(s): GLUCAP in the last 168 hours.     Signed:  Exavier Lina Eliseo Squires  [facility] 07/29/2015, 12:37 PM

## 2015-07-29 NOTE — Progress Notes (Signed)
D/C orders received, pt for D/C home today.  IV and telemetry D/C.  Rx and D/C instructions given with verbalized understanding.  Family at bedside to assist with D/C.  Staff brought pt downstairs via wheelchair.  

## 2015-07-29 NOTE — Care Management Important Message (Signed)
Important Message  Patient Details  Name: [name] MRN: [id number] Date of Birth: [birth year]   Medicare Important Message Given:  Yes    Shaquel Chavous P Leviathan Macera 07/29/2015, 3:03 PM

## 2015-07-29 NOTE — Progress Notes (Signed)
Patient ID: [name], male   DOB: [birth year], 81 y.o.   MRN: [id number]  [facility] Progress Note    Subjective:   Patient was seen on dialysis and the procedure was supervised. BFR 400 Via RIJ TDC BP is 175/55.  Patient appears to be tolerating treatment well    Objective:   BP 175/55 mmHg  Pulse 38  Temp(Src) 97.7 F (36.5 C) (Oral)  Resp 12  Ht 6\' 1"  (1.854 m)  Wt 94.6 kg (208 lb 8.9 oz)  BMI 27.52 kg/m2  SpO2 99%  Intake/Output: I/O last 3 completed shifts: In: 480 [P.O.:480] Out: 875 [Urine:875]   Intake/Output this shift:    Weight change:   Physical Exam: Gen:WD WN AAM in NAD Resp:cta GYF:[id number] Ext:no edema, RUE AVF without bruit, +pulsatile  Labs: BMET  Recent Labs Lab 07/26/15 1210 07/27/15 0503 07/29/15 0220  NA 134* 136 136  K 4.8 5.0 5.9*  CL 98* 99* 100*  CO2 24 26 24   GLUCOSE 149* 165* 118*  BUN 75* 80* 102*  CREATININE 8.37* 9.73* 11.61*  ALBUMIN  --  3.4*  --   CALCIUM 9.0 8.7* 8.9   CBC  Recent Labs Lab 07/26/15 1210 07/27/15 0503 07/29/15 0220  WBC 3.7* 3.0* 3.2*  NEUTROABS 2.3 1.4*  --   HGB 12.9* 11.6* 11.9*  HCT 40.0 35.9* 36.8*  MCV 102.3* 102.0* 100.5*  PLT 107* 110* 96*    @IMGRELPRIORS @ Medications:    . amLODipine  10 mg Oral Daily  . calcitRIOL  1 mcg Oral Q M,W,F-HD  . calcium acetate  2,001 mg Oral TID WC  . calcium gluconate  1 g Intravenous Once  .  ceFAZolin (ANCEF) IV  2 g Intravenous to XRAY  . cinacalcet  60 mg Oral Q supper  . insulin aspart  10 Units Intravenous Once   And  . dextrose  1 ampule Intravenous Once  . divalproex  250 mg Oral Q12H  . folic acid  1 mg Oral Daily  . gabapentin  100 mg Oral Daily  . gelatin adsorbable      . heparin      . lacosamide  100 mg Oral BID  . lacosamide  50 mg Oral Q M,W,F-HD  . lidocaine      . pantoprazole  40 mg Oral QHS  . senna-docusate  1 tablet Oral BID     Assessment/ Plan:   1. Fall/acute on chronic  subdural hematoma- continue to monitor, neuro and neurosurgery involved. Conservative therapy for now. No heparin with HD 2. Sz d/o- additional dose of Vimpat added per Neuro 3. ESRD: cont with MWF schedule and hopeful discharge and f/u with outpt unit soon. No heparin with HD.  F/u tomorrow with home HD unit to stay on schedule. 4. Bradycardia- likely due to hyperkalemia.  Improved with insulin/glucose/calcium.  Will follow after HD. 5. Anemia: ESA on hold  6. CKD-MBD:stable, cont with binders/vit D 7. Nutrition: per primary 8. Hypertension:stable 9. Vascular access- unfortunately pt clotted his AVF yesterday and due to acute on chronic SDH, no thrombolysis/declot was attempted.  He had tunneled HD placed this am by IR.  Will need to arrange for outpt f/u with [facility] for new access placement. 10. Dispo- hopeful discharge today after HD if no seizures.  Rosaline Ezekiel A 07/29/2015, 9:21 AM

## 2015-07-30 DIAGNOSIS — D509 Iron deficiency anemia, unspecified: Secondary | ICD-10-CM | POA: Diagnosis not present

## 2015-07-30 DIAGNOSIS — N186 End stage renal disease: Secondary | ICD-10-CM | POA: Diagnosis not present

## 2015-07-30 DIAGNOSIS — D689 Coagulation defect, unspecified: Secondary | ICD-10-CM | POA: Diagnosis not present

## 2015-07-30 DIAGNOSIS — D631 Anemia in chronic kidney disease: Secondary | ICD-10-CM | POA: Diagnosis not present

## 2015-08-04 ENCOUNTER — Other Ambulatory Visit: Payer: Self-pay | Admitting: *Deleted

## 2015-08-04 DIAGNOSIS — D631 Anemia in chronic kidney disease: Secondary | ICD-10-CM | POA: Diagnosis not present

## 2015-08-04 DIAGNOSIS — N186 End stage renal disease: Secondary | ICD-10-CM | POA: Diagnosis not present

## 2015-08-04 DIAGNOSIS — D509 Iron deficiency anemia, unspecified: Secondary | ICD-10-CM | POA: Diagnosis not present

## 2015-08-04 DIAGNOSIS — D689 Coagulation defect, unspecified: Secondary | ICD-10-CM | POA: Diagnosis not present

## 2015-08-04 MED ORDER — CYCLOBENZAPRINE HCL 5 MG PO TABS: ORAL_TABLET | ORAL | Status: DC

## 2015-08-04 NOTE — Telephone Encounter (Signed)
[facility] [address] 

## 2015-08-05 ENCOUNTER — Ambulatory Visit (INDEPENDENT_AMBULATORY_CARE_PROVIDER_SITE_OTHER): Payer: Medicare Other | Admitting: Podiatry

## 2015-08-05 ENCOUNTER — Encounter: Payer: Self-pay | Admitting: Podiatry

## 2015-08-05 DIAGNOSIS — E1140 Type 2 diabetes mellitus with diabetic neuropathy, unspecified: Secondary | ICD-10-CM

## 2015-08-05 DIAGNOSIS — M79676 Pain in unspecified toe(s): Secondary | ICD-10-CM | POA: Diagnosis not present

## 2015-08-05 DIAGNOSIS — B351 Tinea unguium: Secondary | ICD-10-CM | POA: Diagnosis not present

## 2015-08-05 DIAGNOSIS — S065X0S Traumatic subdural hemorrhage without loss of consciousness, sequela: Secondary | ICD-10-CM | POA: Diagnosis not present

## 2015-08-05 DIAGNOSIS — G40909 Epilepsy, unspecified, not intractable, without status epilepticus: Secondary | ICD-10-CM | POA: Diagnosis not present

## 2015-08-05 DIAGNOSIS — R4189 Other symptoms and signs involving cognitive functions and awareness: Secondary | ICD-10-CM | POA: Diagnosis not present

## 2015-08-05 DIAGNOSIS — Z992 Dependence on renal dialysis: Secondary | ICD-10-CM | POA: Diagnosis not present

## 2015-08-05 DIAGNOSIS — M1990 Unspecified osteoarthritis, unspecified site: Secondary | ICD-10-CM | POA: Diagnosis not present

## 2015-08-05 DIAGNOSIS — Z794 Long term (current) use of insulin: Secondary | ICD-10-CM | POA: Diagnosis not present

## 2015-08-05 DIAGNOSIS — E1122 Type 2 diabetes mellitus with diabetic chronic kidney disease: Secondary | ICD-10-CM | POA: Diagnosis not present

## 2015-08-05 DIAGNOSIS — M545 Low back pain: Secondary | ICD-10-CM | POA: Diagnosis not present

## 2015-08-05 DIAGNOSIS — N186 End stage renal disease: Secondary | ICD-10-CM | POA: Diagnosis not present

## 2015-08-05 DIAGNOSIS — D631 Anemia in chronic kidney disease: Secondary | ICD-10-CM | POA: Diagnosis not present

## 2015-08-05 DIAGNOSIS — W1809XS Striking against other object with subsequent fall, sequela: Secondary | ICD-10-CM | POA: Diagnosis not present

## 2015-08-05 DIAGNOSIS — Z981 Arthrodesis status: Secondary | ICD-10-CM | POA: Diagnosis not present

## 2015-08-05 DIAGNOSIS — I120 Hypertensive chronic kidney disease with stage 5 chronic kidney disease or end stage renal disease: Secondary | ICD-10-CM | POA: Diagnosis not present

## 2015-08-05 NOTE — Progress Notes (Signed)
Patient ID: [name], male   DOB: 1932/01/01, 82 y.o.   MRN: [id number] Complaint:  Visit Type: Patient returns to my office for continued preventative foot care services. Complaint: Patient states" my nails have grown long and thick and become painful to walk and wear shoes" Patient has been diagnosed with DM with no foot complications. The patient presents for preventative foot care services. No changes to ROS  Podiatric Exam: Vascular: dorsalis pedis and posterior tibial pulses are palpable bilateral. Capillary return is immediate. Temperature gradient is WNL. Skin turgor WNL  Sensorium: Normal Semmes Weinstein monofilament test. Normal tactile sensation bilaterally. Nail Exam: Pt has thick disfigured discolored nails with subungual debris noted bilateral entire nail hallux through fifth toenails Ulcer Exam: There is no evidence of ulcer or pre-ulcerative changes or infection. Orthopedic Exam: Muscle tone and strength are WNL. No limitations in general ROM. No crepitus or effusions noted. Foot type and digits show no abnormalities. Bony prominences are unremarkable. Hallux limitus 1st MPJ right foot.  Vontracted hammer toe second right.  HAV 1st MPJ left foot. Skin: No Porokeratosis. No infection or ulcers.    Diagnosis:  Onychomycosis, , Pain in right toe, pain in left toes,  Treatment & Plan Procedures and Treatment: Consent by patient was obtained for treatment procedures. The patient understood the discussion of treatment and procedures well. All questions were answered thoroughly reviewed. Debridement of mycotic and hypertrophic toenails, 1 through 5 bilateral and clearing of subungual debris. No ulceration, no infection noted. Return Visit-Office Procedure: Patient instructed to return to the office for a follow up visit 3 months for continued evaluation and treatment.  Helane Gunther DPM

## 2015-08-06 DIAGNOSIS — D689 Coagulation defect, unspecified: Secondary | ICD-10-CM | POA: Diagnosis not present

## 2015-08-06 DIAGNOSIS — Z992 Dependence on renal dialysis: Secondary | ICD-10-CM | POA: Diagnosis not present

## 2015-08-06 DIAGNOSIS — D509 Iron deficiency anemia, unspecified: Secondary | ICD-10-CM | POA: Diagnosis not present

## 2015-08-06 DIAGNOSIS — D631 Anemia in chronic kidney disease: Secondary | ICD-10-CM | POA: Diagnosis not present

## 2015-08-06 DIAGNOSIS — I120 Hypertensive chronic kidney disease with stage 5 chronic kidney disease or end stage renal disease: Secondary | ICD-10-CM | POA: Diagnosis not present

## 2015-08-06 DIAGNOSIS — N186 End stage renal disease: Secondary | ICD-10-CM | POA: Diagnosis not present

## 2015-08-07 DIAGNOSIS — M545 Low back pain: Secondary | ICD-10-CM | POA: Diagnosis not present

## 2015-08-07 DIAGNOSIS — Z992 Dependence on renal dialysis: Secondary | ICD-10-CM | POA: Diagnosis not present

## 2015-08-07 DIAGNOSIS — G40909 Epilepsy, unspecified, not intractable, without status epilepticus: Secondary | ICD-10-CM | POA: Diagnosis not present

## 2015-08-07 DIAGNOSIS — Z981 Arthrodesis status: Secondary | ICD-10-CM | POA: Diagnosis not present

## 2015-08-07 DIAGNOSIS — N186 End stage renal disease: Secondary | ICD-10-CM | POA: Diagnosis not present

## 2015-08-07 DIAGNOSIS — R4189 Other symptoms and signs involving cognitive functions and awareness: Secondary | ICD-10-CM | POA: Diagnosis not present

## 2015-08-07 DIAGNOSIS — M1990 Unspecified osteoarthritis, unspecified site: Secondary | ICD-10-CM | POA: Diagnosis not present

## 2015-08-07 DIAGNOSIS — I120 Hypertensive chronic kidney disease with stage 5 chronic kidney disease or end stage renal disease: Secondary | ICD-10-CM | POA: Diagnosis not present

## 2015-08-07 DIAGNOSIS — D631 Anemia in chronic kidney disease: Secondary | ICD-10-CM | POA: Diagnosis not present

## 2015-08-07 DIAGNOSIS — Z794 Long term (current) use of insulin: Secondary | ICD-10-CM | POA: Diagnosis not present

## 2015-08-07 DIAGNOSIS — W1809XS Striking against other object with subsequent fall, sequela: Secondary | ICD-10-CM | POA: Diagnosis not present

## 2015-08-07 DIAGNOSIS — E1122 Type 2 diabetes mellitus with diabetic chronic kidney disease: Secondary | ICD-10-CM | POA: Diagnosis not present

## 2015-08-07 DIAGNOSIS — S065X0S Traumatic subdural hemorrhage without loss of consciousness, sequela: Secondary | ICD-10-CM | POA: Diagnosis not present

## 2015-08-08 ENCOUNTER — Encounter: Payer: Self-pay | Admitting: Vascular Surgery

## 2015-08-08 DIAGNOSIS — D509 Iron deficiency anemia, unspecified: Secondary | ICD-10-CM | POA: Diagnosis not present

## 2015-08-08 DIAGNOSIS — D689 Coagulation defect, unspecified: Secondary | ICD-10-CM | POA: Diagnosis not present

## 2015-08-08 DIAGNOSIS — N186 End stage renal disease: Secondary | ICD-10-CM | POA: Diagnosis not present

## 2015-08-08 DIAGNOSIS — E1129 Type 2 diabetes mellitus with other diabetic kidney complication: Secondary | ICD-10-CM | POA: Diagnosis not present

## 2015-08-11 DIAGNOSIS — E1129 Type 2 diabetes mellitus with other diabetic kidney complication: Secondary | ICD-10-CM | POA: Diagnosis not present

## 2015-08-11 DIAGNOSIS — D509 Iron deficiency anemia, unspecified: Secondary | ICD-10-CM | POA: Diagnosis not present

## 2015-08-11 DIAGNOSIS — N186 End stage renal disease: Secondary | ICD-10-CM | POA: Diagnosis not present

## 2015-08-11 DIAGNOSIS — D689 Coagulation defect, unspecified: Secondary | ICD-10-CM | POA: Diagnosis not present

## 2015-08-12 ENCOUNTER — Other Ambulatory Visit: Payer: Self-pay | Admitting: *Deleted

## 2015-08-12 DIAGNOSIS — M545 Low back pain: Secondary | ICD-10-CM | POA: Diagnosis not present

## 2015-08-12 DIAGNOSIS — W1809XS Striking against other object with subsequent fall, sequela: Secondary | ICD-10-CM | POA: Diagnosis not present

## 2015-08-12 DIAGNOSIS — S065X0S Traumatic subdural hemorrhage without loss of consciousness, sequela: Secondary | ICD-10-CM | POA: Diagnosis not present

## 2015-08-12 DIAGNOSIS — G40909 Epilepsy, unspecified, not intractable, without status epilepticus: Secondary | ICD-10-CM | POA: Diagnosis not present

## 2015-08-12 DIAGNOSIS — D631 Anemia in chronic kidney disease: Secondary | ICD-10-CM | POA: Diagnosis not present

## 2015-08-12 DIAGNOSIS — R4189 Other symptoms and signs involving cognitive functions and awareness: Secondary | ICD-10-CM | POA: Diagnosis not present

## 2015-08-12 DIAGNOSIS — Z01810 Encounter for preprocedural cardiovascular examination: Secondary | ICD-10-CM

## 2015-08-12 DIAGNOSIS — N186 End stage renal disease: Secondary | ICD-10-CM | POA: Diagnosis not present

## 2015-08-12 DIAGNOSIS — Z992 Dependence on renal dialysis: Secondary | ICD-10-CM | POA: Diagnosis not present

## 2015-08-12 DIAGNOSIS — Z794 Long term (current) use of insulin: Secondary | ICD-10-CM | POA: Diagnosis not present

## 2015-08-12 DIAGNOSIS — I120 Hypertensive chronic kidney disease with stage 5 chronic kidney disease or end stage renal disease: Secondary | ICD-10-CM | POA: Diagnosis not present

## 2015-08-12 DIAGNOSIS — Z981 Arthrodesis status: Secondary | ICD-10-CM | POA: Diagnosis not present

## 2015-08-12 DIAGNOSIS — E1122 Type 2 diabetes mellitus with diabetic chronic kidney disease: Secondary | ICD-10-CM | POA: Diagnosis not present

## 2015-08-12 DIAGNOSIS — M1990 Unspecified osteoarthritis, unspecified site: Secondary | ICD-10-CM | POA: Diagnosis not present

## 2015-08-13 ENCOUNTER — Ambulatory Visit (HOSPITAL_COMMUNITY)
Admission: RE | Admit: 2015-08-13 | Discharge: 2015-08-13 | Disposition: A | Discharge status: Home / Self Care | Payer: Medicare Other | Source: Ambulatory Visit | Attending: Vascular Surgery | Admitting: Vascular Surgery

## 2015-08-13 ENCOUNTER — Ambulatory Visit (INDEPENDENT_AMBULATORY_CARE_PROVIDER_SITE_OTHER)
Admission: RE | Admit: 2015-08-13 | Discharge: 2015-08-13 | Disposition: A | Discharge status: Home / Self Care | Payer: Medicare Other | Source: Ambulatory Visit | Attending: Vascular Surgery | Admitting: Vascular Surgery

## 2015-08-13 ENCOUNTER — Ambulatory Visit (INDEPENDENT_AMBULATORY_CARE_PROVIDER_SITE_OTHER): Payer: Medicare Other | Admitting: Vascular Surgery

## 2015-08-13 ENCOUNTER — Encounter: Payer: Self-pay | Admitting: Vascular Surgery

## 2015-08-13 VITALS — BP 173/72 | HR 81 | Temp 97.5°F | Resp 16 | Ht 72.0 in | Wt 208.0 lb

## 2015-08-13 DIAGNOSIS — Z01810 Encounter for preprocedural cardiovascular examination: Secondary | ICD-10-CM | POA: Insufficient documentation

## 2015-08-13 DIAGNOSIS — N186 End stage renal disease: Secondary | ICD-10-CM | POA: Diagnosis not present

## 2015-08-13 DIAGNOSIS — Z992 Dependence on renal dialysis: Secondary | ICD-10-CM | POA: Diagnosis not present

## 2015-08-13 NOTE — Progress Notes (Signed)
Filed Vitals:   08/13/15 1337 08/13/15 1340  BP: 174/81 173/72  Pulse: 81 81  Temp: 97.5 F (36.4 C)   Resp: 16   Height: 6' (1.829 m)   Weight: 208 lb (94.348 kg)   SpO2: 99%

## 2015-08-13 NOTE — Progress Notes (Signed)
[facility] [place] HISTORY AND PHYSICAL   History of Present Illness:  Patient is a 81 y.o. male referred by Dr. Abel Presto for placement of a new hemodialysis access. The patient is right handed.  He has had a previous right upper arm AV fistula which is now occluded.  The patient is currently on hemodialysis.  He dialyzes Monday Wednesday and Friday at [facility]. The cause of renal failure is thought to be secondary to hypertension and diabetes.  Other chronic medical problems include hypertension diabetes arthritis history of GI bleed and had a seizure most recent one at dialysis earlier this week. Otherwise his medical problems have been stable..  Past Medical History  Diagnosis Date  . Hypertension   . Diabetes mellitus without complication (HCC)   . Arthritis   . History of blood transfusion   . GI bleed   . ESRD (end stage renal disease) (HCC)   . Seizure (HCC)   . Dialysis patient (HCC)   . Subdural hematoma (HCC) 05/2015    from fall    Past Surgical History  Procedure Laterality Date  . Esophagogastroduodenoscopy  10/03/2012    Procedure: ESOPHAGOGASTRODUODENOSCOPY (EGD);  Surgeon: Theda Belfast, MD;  Location: [facility] ENDOSCOPY;  Service: Endoscopy;  Laterality: N/A;  . Colonoscopy  10/04/2012    Procedure: COLONOSCOPY;  Surgeon: Theda Belfast, MD;  Location: [facility] ENDOSCOPY;  Service: Endoscopy;  Laterality: N/A;  . Colonoscopy with esophagogastroduodenoscopy (egd) Left 11/30/2012    Procedure: COLONOSCOPY WITH ESOPHAGOGASTRODUODENOSCOPY (EGD);  Surgeon: Theda Belfast, MD;  Location: [facility] ENDOSCOPY;  Service: Endoscopy;  Laterality: Left;  . Spine surgery  2004    back fusion/ [facility] Penumalli,MD  . Retinal laser procedure Right 04/2015     Social History Social History  Substance Use Topics  . Smoking status: Never Smoker   . Smokeless tobacco: Never Used  . Alcohol Use: No    Family History Family History  Problem Relation Age of Onset  .  Sudden death Mother   . Sudden death Father     Allergies  Allergies  Allergen Reactions  . Aspirin Other (See Comments)    Bleeding   . Lactose Intolerance (Gi) Diarrhea     Current Outpatient Prescriptions  Medication Sig Dispense Refill  . allopurinol (ZYLOPRIM) 100 MG tablet Take 100 mg by mouth 2 (two) times a week.    Marland Kitchen amLODipine (NORVASC) 10 MG tablet Take 10 mg by mouth Daily.     Marland Kitchen BESIVANCE 0.6 % SUSP Place 1 drop into the right eye 4 (four) times daily as needed. For 2 days after each monthly injection  1  . calcium acetate (PHOSLO) 667 MG capsule Take 3 capsules by mouth three times a day 180 capsule 3  . CALCIUM CARBONATE PO Take 650 mg by mouth 3 (three) times daily.    . cyclobenzaprine (FLEXERIL) 5 MG tablet Take one tablet by mouth once daily as needed for spasms 30 tablet 0  . darbepoetin (ARANESP) 60 MCG/0.3ML SOLN injection Inject 0.3 mLs (60 mcg total) into the vein every Thursday with hemodialysis. (Patient taking differently: Inject 60 mcg into the vein every 7 (seven) days. ) 4.2 mL   . Diphenhydramine-APAP 25-500 MG TABS Take by mouth at bedtime as needed.    . divalproex (DEPAKOTE) 250 MG DR tablet Take 1 tablet (250 mg total) by mouth every 12 (twelve) hours. 60 tablet 0  . ferrous sulfate 325 (65 FE) MG tablet Take 325 mg by  mouth 2 (two) times daily with a meal.    . folic acid (FOLVITE) 1 MG tablet Take 1 tablet (1 mg total) by mouth daily. 30 tablet 0  . gabapentin (NEURONTIN) 100 MG capsule Take 1 capsule (100 mg total) by mouth daily. 30 capsule 0  . glucose blood (ACCU-CHEK AVIVA PLUS) test strip Check blood sugar three times daily 100 each 3  . HUMALOG KWIKPEN 100 UNIT/ML KiwkPen Sliding Scale  0  . multivitamin (RENA-VIT) TABS tablet Take 1 tablet by mouth at bedtime. 30 tablet 0  . pantoprazole (PROTONIX) 20 MG tablet Take 20 mg by mouth 2 (two) times daily.    Marland Kitchen senna-docusate (SENOKOT-S) 8.6-50 MG per tablet Take 1 tablet by mouth. As needed     . sodium bicarbonate 650 MG tablet Take 650 mg by mouth 2 (two) times daily.    . sodium chloride 0.9 % SOLN 100 mL with ferric gluconate 12.5 MG/ML SOLN 62.5 mg Inject 62.5 mg into the vein every Thursday with hemodialysis.    Marland Kitchen calcitRIOL (ROCALTROL) 0.5 MCG capsule Take 2 capsules (1 mcg total) by mouth every Monday, Wednesday, and Friday with hemodialysis.    Marland Kitchen cinacalcet (SENSIPAR) 30 MG tablet Take 2 tablets (60 mg total) by mouth daily with supper. 30 tablet 0  . colchicine 0.6 MG tablet Take 0.6 mg by mouth as needed.     . fexofenadine (ALLEGRA) 180 MG tablet Take 180 mg by mouth daily. As needed    . lacosamide (VIMPAT) 50 MG TABS tablet Take 1 tablet (50 mg total) by mouth every Monday, Wednesday, and Friday with hemodialysis. 15 tablet 1  . Lacosamide 100 MG TABS Take 1 tablet (100 mg total) by mouth 2 (two) times daily. 60 tablet 3  . triamcinolone (NASACORT ALLERGY 24HR) 55 MCG/ACT AERO nasal inhaler Place 1 spray into the nose. One spray nasal as needed for allery     No current facility-administered medications for this visit.    ROS:   General:  No weight loss, Fever, chills  HEENT: No recent headaches, no nasal bleeding, no visual changes, no sore throat  Neurologic: No dizziness, blackouts, +seizures. No recent symptoms of stroke or mini- stroke. No recent episodes of slurred speech, or temporary blindness.  Cardiac: No recent episodes of chest pain/pressure, no shortness of breath at rest.  + shortness of breath with exertion.  Denies history of atrial fibrillation or irregular heartbeat  Vascular: No history of rest pain in feet.  No history of claudication.  No history of non-healing ulcer, No history of DVT   Pulmonary: No home oxygen, no productive cough, no hemoptysis,  No asthma or wheezing  Musculoskeletal:  [ ]  Arthritis, [ ]  Low back pain,  [ ]  Joint pain  Hematologic:No history of hypercoagulable state.  No history of easy bleeding.  No history of  anemia  Gastrointestinal: No hematochezia or melena,  No gastroesophageal reflux, no trouble swallowing  Urinary: [ ]  chronic Kidney disease, [ ]  on HD - [ ]  MWF or [ ]  TTHS, [ ]  Burning with urination, [ ]  Frequent urination, [ ]  Difficulty urinating;   Skin: No rashes  Psychological: No history of anxiety,  No history of depression   Physical Examination  Filed Vitals:   08/13/15 1337 08/13/15 1340  BP: 174/81 173/72  Pulse: 81 81  Temp: 97.5 F (36.4 C)   Resp: 16   Height: 6' (1.829 m)   Weight: 208 lb (94.348 kg)   SpO2: 99%  Body mass index is 28.2 kg/(m^2).  General:  Alert and oriented, no acute distress HEENT: Normal Neck: No bruit or JVD Pulmonary: Clear to auscultation bilaterally, right side dialysis catheter Cardiac: Regular Rate and Rhythm  Gastrointestinal: Soft, non-tender, non-distended, no mass Skin: No rash Extremity Pulses:  2+ radial, brachial pulses bilaterally, pulsatile right upper arm AV fistula Musculoskeletal: No deformity or edema  Neurologic: Upper and lower extremity motor 5/5 and symmetric  DATA:   Vein mapping ultrasound shows no suitable cephalic vein left or right arm vein is marginal in the basilic left and right arm as well. Patient has essentially normal brachial bifurcation but does have calcified vessels   ASSESSMENT: Patient needs long-term hemodialysis access   PLAN:  Left arm AV graft scheduled for 08/26/2015. Risks benefits possible complications and procedure details were explained to the patient today including not limited to bleeding infection steal graft thrombosis. Patient understands and agrees to proceed.  Charles Fields, MD [facility] [place] Office: [phone] Pager: [phone]   

## 2015-08-14 ENCOUNTER — Encounter (INDEPENDENT_AMBULATORY_CARE_PROVIDER_SITE_OTHER): Payer: Medicare Other | Admitting: Ophthalmology

## 2015-08-14 ENCOUNTER — Other Ambulatory Visit: Payer: Self-pay

## 2015-08-14 DIAGNOSIS — I120 Hypertensive chronic kidney disease with stage 5 chronic kidney disease or end stage renal disease: Secondary | ICD-10-CM | POA: Diagnosis not present

## 2015-08-14 DIAGNOSIS — G40909 Epilepsy, unspecified, not intractable, without status epilepticus: Secondary | ICD-10-CM | POA: Diagnosis not present

## 2015-08-14 DIAGNOSIS — W1809XS Striking against other object with subsequent fall, sequela: Secondary | ICD-10-CM | POA: Diagnosis not present

## 2015-08-14 DIAGNOSIS — H43813 Vitreous degeneration, bilateral: Secondary | ICD-10-CM

## 2015-08-14 DIAGNOSIS — M545 Low back pain: Secondary | ICD-10-CM | POA: Diagnosis not present

## 2015-08-14 DIAGNOSIS — H35033 Hypertensive retinopathy, bilateral: Secondary | ICD-10-CM

## 2015-08-14 DIAGNOSIS — Z794 Long term (current) use of insulin: Secondary | ICD-10-CM | POA: Diagnosis not present

## 2015-08-14 DIAGNOSIS — N186 End stage renal disease: Secondary | ICD-10-CM | POA: Diagnosis not present

## 2015-08-14 DIAGNOSIS — M1990 Unspecified osteoarthritis, unspecified site: Secondary | ICD-10-CM | POA: Diagnosis not present

## 2015-08-14 DIAGNOSIS — E113292 Type 2 diabetes mellitus with mild nonproliferative diabetic retinopathy without macular edema, left eye: Secondary | ICD-10-CM | POA: Diagnosis not present

## 2015-08-14 DIAGNOSIS — Z992 Dependence on renal dialysis: Secondary | ICD-10-CM | POA: Diagnosis not present

## 2015-08-14 DIAGNOSIS — E11311 Type 2 diabetes mellitus with unspecified diabetic retinopathy with macular edema: Secondary | ICD-10-CM

## 2015-08-14 DIAGNOSIS — R4189 Other symptoms and signs involving cognitive functions and awareness: Secondary | ICD-10-CM | POA: Diagnosis not present

## 2015-08-14 DIAGNOSIS — E1122 Type 2 diabetes mellitus with diabetic chronic kidney disease: Secondary | ICD-10-CM | POA: Diagnosis not present

## 2015-08-14 DIAGNOSIS — S065X0S Traumatic subdural hemorrhage without loss of consciousness, sequela: Secondary | ICD-10-CM | POA: Diagnosis not present

## 2015-08-14 DIAGNOSIS — D631 Anemia in chronic kidney disease: Secondary | ICD-10-CM | POA: Diagnosis not present

## 2015-08-14 DIAGNOSIS — I10 Essential (primary) hypertension: Secondary | ICD-10-CM | POA: Diagnosis not present

## 2015-08-14 DIAGNOSIS — Z981 Arthrodesis status: Secondary | ICD-10-CM | POA: Diagnosis not present

## 2015-08-14 DIAGNOSIS — E113311 Type 2 diabetes mellitus with moderate nonproliferative diabetic retinopathy with macular edema, right eye: Secondary | ICD-10-CM | POA: Diagnosis not present

## 2015-08-15 DIAGNOSIS — D509 Iron deficiency anemia, unspecified: Secondary | ICD-10-CM | POA: Diagnosis not present

## 2015-08-15 DIAGNOSIS — E1129 Type 2 diabetes mellitus with other diabetic kidney complication: Secondary | ICD-10-CM | POA: Diagnosis not present

## 2015-08-15 DIAGNOSIS — N186 End stage renal disease: Secondary | ICD-10-CM | POA: Diagnosis not present

## 2015-08-15 DIAGNOSIS — D689 Coagulation defect, unspecified: Secondary | ICD-10-CM | POA: Diagnosis not present

## 2015-08-18 ENCOUNTER — Ambulatory Visit: Payer: Medicare Other | Admitting: Internal Medicine

## 2015-08-18 DIAGNOSIS — D509 Iron deficiency anemia, unspecified: Secondary | ICD-10-CM | POA: Diagnosis not present

## 2015-08-18 DIAGNOSIS — N186 End stage renal disease: Secondary | ICD-10-CM | POA: Diagnosis not present

## 2015-08-18 DIAGNOSIS — E1129 Type 2 diabetes mellitus with other diabetic kidney complication: Secondary | ICD-10-CM | POA: Diagnosis not present

## 2015-08-18 DIAGNOSIS — D689 Coagulation defect, unspecified: Secondary | ICD-10-CM | POA: Diagnosis not present

## 2015-08-19 DIAGNOSIS — Z981 Arthrodesis status: Secondary | ICD-10-CM | POA: Diagnosis not present

## 2015-08-19 DIAGNOSIS — I120 Hypertensive chronic kidney disease with stage 5 chronic kidney disease or end stage renal disease: Secondary | ICD-10-CM | POA: Diagnosis not present

## 2015-08-19 DIAGNOSIS — W1809XS Striking against other object with subsequent fall, sequela: Secondary | ICD-10-CM | POA: Diagnosis not present

## 2015-08-19 DIAGNOSIS — N186 End stage renal disease: Secondary | ICD-10-CM | POA: Diagnosis not present

## 2015-08-19 DIAGNOSIS — Z794 Long term (current) use of insulin: Secondary | ICD-10-CM | POA: Diagnosis not present

## 2015-08-19 DIAGNOSIS — M1990 Unspecified osteoarthritis, unspecified site: Secondary | ICD-10-CM | POA: Diagnosis not present

## 2015-08-19 DIAGNOSIS — G40909 Epilepsy, unspecified, not intractable, without status epilepticus: Secondary | ICD-10-CM | POA: Diagnosis not present

## 2015-08-19 DIAGNOSIS — E1122 Type 2 diabetes mellitus with diabetic chronic kidney disease: Secondary | ICD-10-CM | POA: Diagnosis not present

## 2015-08-19 DIAGNOSIS — D631 Anemia in chronic kidney disease: Secondary | ICD-10-CM | POA: Diagnosis not present

## 2015-08-19 DIAGNOSIS — Z992 Dependence on renal dialysis: Secondary | ICD-10-CM | POA: Diagnosis not present

## 2015-08-19 DIAGNOSIS — R4189 Other symptoms and signs involving cognitive functions and awareness: Secondary | ICD-10-CM | POA: Diagnosis not present

## 2015-08-19 DIAGNOSIS — M545 Low back pain: Secondary | ICD-10-CM | POA: Diagnosis not present

## 2015-08-19 DIAGNOSIS — S065X0S Traumatic subdural hemorrhage without loss of consciousness, sequela: Secondary | ICD-10-CM | POA: Diagnosis not present

## 2015-08-20 DIAGNOSIS — D689 Coagulation defect, unspecified: Secondary | ICD-10-CM | POA: Diagnosis not present

## 2015-08-20 DIAGNOSIS — N186 End stage renal disease: Secondary | ICD-10-CM | POA: Diagnosis not present

## 2015-08-20 DIAGNOSIS — D509 Iron deficiency anemia, unspecified: Secondary | ICD-10-CM | POA: Diagnosis not present

## 2015-08-20 DIAGNOSIS — E1129 Type 2 diabetes mellitus with other diabetic kidney complication: Secondary | ICD-10-CM | POA: Diagnosis not present

## 2015-08-21 DIAGNOSIS — D631 Anemia in chronic kidney disease: Secondary | ICD-10-CM | POA: Diagnosis not present

## 2015-08-21 DIAGNOSIS — R4189 Other symptoms and signs involving cognitive functions and awareness: Secondary | ICD-10-CM | POA: Diagnosis not present

## 2015-08-21 DIAGNOSIS — N186 End stage renal disease: Secondary | ICD-10-CM | POA: Diagnosis not present

## 2015-08-21 DIAGNOSIS — M1990 Unspecified osteoarthritis, unspecified site: Secondary | ICD-10-CM | POA: Diagnosis not present

## 2015-08-21 DIAGNOSIS — Z981 Arthrodesis status: Secondary | ICD-10-CM | POA: Diagnosis not present

## 2015-08-21 DIAGNOSIS — I120 Hypertensive chronic kidney disease with stage 5 chronic kidney disease or end stage renal disease: Secondary | ICD-10-CM | POA: Diagnosis not present

## 2015-08-21 DIAGNOSIS — Z992 Dependence on renal dialysis: Secondary | ICD-10-CM | POA: Diagnosis not present

## 2015-08-21 DIAGNOSIS — E1122 Type 2 diabetes mellitus with diabetic chronic kidney disease: Secondary | ICD-10-CM | POA: Diagnosis not present

## 2015-08-21 DIAGNOSIS — G40909 Epilepsy, unspecified, not intractable, without status epilepticus: Secondary | ICD-10-CM | POA: Diagnosis not present

## 2015-08-21 DIAGNOSIS — Z794 Long term (current) use of insulin: Secondary | ICD-10-CM | POA: Diagnosis not present

## 2015-08-21 DIAGNOSIS — M545 Low back pain: Secondary | ICD-10-CM | POA: Diagnosis not present

## 2015-08-21 DIAGNOSIS — W1809XS Striking against other object with subsequent fall, sequela: Secondary | ICD-10-CM | POA: Diagnosis not present

## 2015-08-21 DIAGNOSIS — S065X0S Traumatic subdural hemorrhage without loss of consciousness, sequela: Secondary | ICD-10-CM | POA: Diagnosis not present

## 2015-08-22 DIAGNOSIS — E1129 Type 2 diabetes mellitus with other diabetic kidney complication: Secondary | ICD-10-CM | POA: Diagnosis not present

## 2015-08-22 DIAGNOSIS — N186 End stage renal disease: Secondary | ICD-10-CM | POA: Diagnosis not present

## 2015-08-22 DIAGNOSIS — D689 Coagulation defect, unspecified: Secondary | ICD-10-CM | POA: Diagnosis not present

## 2015-08-22 DIAGNOSIS — D509 Iron deficiency anemia, unspecified: Secondary | ICD-10-CM | POA: Diagnosis not present

## 2015-08-25 ENCOUNTER — Telehealth: Payer: Self-pay | Admitting: Vascular Surgery

## 2015-08-25 ENCOUNTER — Encounter (HOSPITAL_COMMUNITY): Payer: Self-pay | Admitting: Vascular Surgery

## 2015-08-25 ENCOUNTER — Encounter (HOSPITAL_COMMUNITY): Payer: Self-pay | Admitting: *Deleted

## 2015-08-25 DIAGNOSIS — E1129 Type 2 diabetes mellitus with other diabetic kidney complication: Secondary | ICD-10-CM | POA: Diagnosis not present

## 2015-08-25 DIAGNOSIS — N186 End stage renal disease: Secondary | ICD-10-CM | POA: Diagnosis not present

## 2015-08-25 DIAGNOSIS — D509 Iron deficiency anemia, unspecified: Secondary | ICD-10-CM | POA: Diagnosis not present

## 2015-08-25 DIAGNOSIS — D689 Coagulation defect, unspecified: Secondary | ICD-10-CM | POA: Diagnosis not present

## 2015-08-25 MED ORDER — DEXTROSE 5 % IV SOLN: 1.5000 g | INTRAVENOUS | Status: AC

## 2015-08-25 MED ORDER — SODIUM CHLORIDE 0.9 % IV SOLN: INTRAVENOUS | Status: DC

## 2015-08-25 MED ORDER — CHLORHEXIDINE GLUCONATE CLOTH 2 % EX PADS: 6.0000 | MEDICATED_PAD | Freq: Once | CUTANEOUS | Status: DC

## 2015-08-25 NOTE — Telephone Encounter (Signed)
notified stephanie Obey of appt. for [name] on August 26, 2015 at 2:15 pm with dr. Elberta Fortis as per stephanie

## 2015-08-25 NOTE — Progress Notes (Signed)
Anesthesia Chart Review: SAME DAY WORK-UP.  Patient is a 81 year old male scheduled for insertion of LUE AVGG tomorrow by Dr. Darrick Penna. He is on HD and has an occluded RUE AVF. He has a right sided IJ dialysis catheter (placed 07/29/15 by IR). Anesthesia is posted for Choice.   History includes ESRD on HD MWF ([place]), non-smoker, HTN, DM2, arthritis, GI bleed (with history of AVM on colon; Depakote added to Vimpat regimen), seizure (last 07/26/15 while on HD), SDH from fall 05/2015 (evaluated by Neurosurgery Dr. Jeral Fruit 07/28/15, conservative therapy rec.), OSA with CPAP, spinal surgery '04. 2014 echo showed SEVERE PULMONARY HYPERTENSION. PCP is Dr. [name]. Nephrologist is Dr. Abel Presto.  He was seen by EP cardiologist Dr. Loman Brooklyn on 06/21/15 for bradycardia (junctional bradycardia) during hospitalization for fall with recent SDH. His A/P states:  Sinus/junctional bradycardia: Uncertain if this was due to his renal failure and elevated K. He has not had any further bradycardia since having dialysis. He says that he feels much improved since dialysis on admission. Discussed pacemaker with the patient and he states that he would rather watch and wait to see if bradycardia recurs and if he has symptoms. This is probably reasonable due to the high risk of device infection in a dialysis patient. Will continue to monitor.  Meds include Nasacor, ferric gluconate, sodium bicarbonate, Protonix, Rena-vit, Lacosamide, Humalog, gabapentin, folic acid, Allegra, Flexeril, Aranesp, ferrous sulfate, Depakote, Sensipar, Phoslo, amlodipine.  07/29/15 EKG: Junctional bradycardia at 37 bpm, non-specific T wave abnormality. (EKG during acute hospitalization following seizure. Patient was asymptomatic. MD was paged, with no new orders noted. He was discharged later that day following HD catheter insertion. It doesn't look like they notified cardiology that he had recurrent junctional bradycardia.) Previous EKG  on 07/26/15 showed SR, probable LAE, abnormal r wave progression, early transition, non-specific lateral T wave abnormality.  01/14/13 Echo (done for evaluation of syncope/seizure): Study Conclusions - Left ventricle: The cavity size was normal. Wall thickness was increased in a pattern of mild LVH. Systolic function was normal. The estimated ejection fraction was in the range of 55% to 60%. Wall motion was normal; there were no regional wall motion abnormalities. Doppler parameters are consistent with abnormal left ventricular relaxation (grade 1 diastolic dysfunction). - Aortic valve: Moderate regurgitation. - Mitral valve: Calcified annulus. Mildly thickened leaflets . Mild regurgitation. - Left atrium: The atrium was severely dilated. - Right atrium: The atrium was mildly dilated. - Pulmonary arteries: Systolic pressure was severely increased. PA peak pressure: 15mm Hg (S).  07/26/15 1V CXR: IMPRESSION: 1. No acute findings. 2. Low lung volumes.  07/28/15 Head CT (follow-up SDH):  IMPRESSION: 1. High-density subdural hematoma again noted along the LEFT cerebral convexity. Maximum depth of the hematoma equals 10 mm unchanged from 10 mm on comparison exam. Small volume subdural hematoma noted over the LEFT cerebral tentorium. No change in volume of LEFT subdural hematoma when compared to 07/26/15 CT. 2. Minimal 1 to 2 mm rightward midline shift. 3. No new intracranial hemorrhage.  He will need labs pre-operatively. PLT count on 07/29/15 was 89K.  Discussed above with anesthesiologist Dr. Aleene Davidson and also with Dr. Darrick Penna. Patient has a complicated history as outlined above. I don't see in Epic that anyone has really addressed his pulmonary hypertension (although Dr. Elberta Fortis does mention echo results in his consult note). It's also unclear if patient is having symptoms related to his intermittent junctional bradycardia--had at least one episode during his 07/2015  hospitalization. He  has had falls, but also has known seizure disorder and SDH. Patient has a HD catheter currently, so will plan on patient having surgery after he is re-evaluated by cardiology to address junctional bradycardia and severe pulmonary hypertension.  Velna Ochs [facility] Short Stay Center/Anesthesiology Phone [phone] 08/25/2015 1:27 PM

## 2015-08-25 NOTE — Progress Notes (Incomplete)
Pt SDW-pre-op call completed by pt son, Franky Reier and Mitchell's spouse. Spouse made aware to administer 70% of SS ( Novolog Mix 70/30) with dinner and no insulin the morning of procedure. Mrs.Bohne given pre-op protocol instructions on how to treat a BS < 70 and >220. Mrs. Karney also made aware to stop otc vitamins, fish oil and NSAID's.

## 2015-08-26 ENCOUNTER — Encounter: Payer: Self-pay | Admitting: Cardiology

## 2015-08-26 ENCOUNTER — Ambulatory Visit (INDEPENDENT_AMBULATORY_CARE_PROVIDER_SITE_OTHER): Payer: Medicare Other | Admitting: Cardiology

## 2015-08-26 ENCOUNTER — Ambulatory Visit (HOSPITAL_COMMUNITY): Admission: RE | Admit: 2015-08-26 | Payer: Medicare Other | Source: Ambulatory Visit | Admitting: Vascular Surgery

## 2015-08-26 VITALS — BP 146/78 | HR 85 | Ht 72.0 in | Wt 206.2 lb

## 2015-08-26 DIAGNOSIS — W1809XS Striking against other object with subsequent fall, sequela: Secondary | ICD-10-CM | POA: Diagnosis not present

## 2015-08-26 DIAGNOSIS — Z794 Long term (current) use of insulin: Secondary | ICD-10-CM | POA: Diagnosis not present

## 2015-08-26 DIAGNOSIS — D631 Anemia in chronic kidney disease: Secondary | ICD-10-CM | POA: Diagnosis not present

## 2015-08-26 DIAGNOSIS — Z981 Arthrodesis status: Secondary | ICD-10-CM | POA: Diagnosis not present

## 2015-08-26 DIAGNOSIS — R001 Bradycardia, unspecified: Secondary | ICD-10-CM | POA: Diagnosis not present

## 2015-08-26 DIAGNOSIS — M545 Low back pain: Secondary | ICD-10-CM | POA: Diagnosis not present

## 2015-08-26 DIAGNOSIS — S065X0S Traumatic subdural hemorrhage without loss of consciousness, sequela: Secondary | ICD-10-CM | POA: Diagnosis not present

## 2015-08-26 DIAGNOSIS — Z01810 Encounter for preprocedural cardiovascular examination: Secondary | ICD-10-CM | POA: Diagnosis not present

## 2015-08-26 DIAGNOSIS — Z992 Dependence on renal dialysis: Secondary | ICD-10-CM | POA: Diagnosis not present

## 2015-08-26 DIAGNOSIS — E1122 Type 2 diabetes mellitus with diabetic chronic kidney disease: Secondary | ICD-10-CM | POA: Diagnosis not present

## 2015-08-26 DIAGNOSIS — M1990 Unspecified osteoarthritis, unspecified site: Secondary | ICD-10-CM | POA: Diagnosis not present

## 2015-08-26 DIAGNOSIS — N186 End stage renal disease: Secondary | ICD-10-CM | POA: Diagnosis not present

## 2015-08-26 DIAGNOSIS — I120 Hypertensive chronic kidney disease with stage 5 chronic kidney disease or end stage renal disease: Secondary | ICD-10-CM | POA: Diagnosis not present

## 2015-08-26 DIAGNOSIS — R4189 Other symptoms and signs involving cognitive functions and awareness: Secondary | ICD-10-CM | POA: Diagnosis not present

## 2015-08-26 DIAGNOSIS — G40909 Epilepsy, unspecified, not intractable, without status epilepticus: Secondary | ICD-10-CM | POA: Diagnosis not present

## 2015-08-26 HISTORY — DX: Pulmonary hypertension, unspecified: I27.20

## 2015-08-26 HISTORY — DX: Sleep apnea, unspecified: G47.30

## 2015-08-26 SURGERY — INSERTION OF ARTERIOVENOUS (AV) GORE-TEX GRAFT ARM
Anesthesia: Choice | Laterality: Left

## 2015-08-26 NOTE — Progress Notes (Signed)
Electrophysiology Office Note   Date:  08/26/2015   ID:  [name], DOB November 19, 1931, MRN [id number]  PCP:  [name], DO  Primary Electrophysiologist:  Regan Lemming, MD    Chief Complaint  Patient presents with  . Follow-up     History of Present Illness: [name] is a 81 y.o. male who presents today for electrophysiology evaluation.   He has a history of end-stage renal disease on dialysis, hypertension, diabetes with neuropathy, and a recent subdural hematoma. He presented to the hospital in October after a fall when he was unable to get out of bed. He did not lose consciousness when he presented to the emergency room his heart rate was in the 40s and a sinus and then a junctional bradycardia. His potassium was 5.6 and he was dialyzed that time since then he had no further episodes of bradycardia during his hospital stay.  During his hospital stay the only time he was bradycardic with a junctional bradycardia was when he was hyperkalemic. He felt much improved after dialysis on admission. He wanted to wait to see if his bradycardia would recur.   Today, he denies symptoms of palpitations, chest pain, shortness of breath, orthopnea, PND, lower extremity edema, claudication, dizziness, presyncope, syncope, bleeding, or neurologic sequela. The patient is tolerating medications without difficulties and is otherwise without complaint today.    Past Medical History  Diagnosis Date  . Hypertension   . Diabetes mellitus without complication (HCC)   . Arthritis   . History of blood transfusion   . GI bleed   . ESRD (end stage renal disease) (HCC)   . Seizure (HCC)   . Dialysis patient (HCC)   . Subdural hematoma (HCC) 05/2015    from fall  . Sleep apnea     wears CPAP n/c  . Pulmonary hypertension (HCC)     severe pulmonary HTN by 01/14/13 echo   Past Surgical History  Procedure Laterality Date  . Esophagogastroduodenoscopy  10/03/2012    Procedure:  ESOPHAGOGASTRODUODENOSCOPY (EGD);  Surgeon: Theda Belfast, MD;  Location: [facility] ENDOSCOPY;  Service: Endoscopy;  Laterality: N/A;  . Colonoscopy  10/04/2012    Procedure: COLONOSCOPY;  Surgeon: Theda Belfast, MD;  Location[facility] ENDOSCOPY;  Service: Endoscopy;  Laterality: N/A;  . Colonoscopy with esophagogastroduodenoscopy (egd) Left 11/30/2012    Procedure: COLONOSCOPY WITH ESOPHAGOGASTRODUODENOSCOPY (EGD);  Surgeon: Theda Belfast, MD;  Location: [facility] ENDOSCOPY;  Service: Endoscopy;  Laterality: Left;  . Spine surgery  2004    back fusion/ [facility] Penumalli,MD  . Retinal laser procedure Right 04/2015     Current Outpatient Prescriptions  Medication Sig Dispense Refill  . allopurinol (ZYLOPRIM) 100 MG tablet Take 100 mg by mouth 2 (two) times a week.    Marland Kitchen amLODipine (NORVASC) 10 MG tablet Take 10 mg by mouth Daily.     Marland Kitchen BESIVANCE 0.6 % SUSP Place 1 drop into the right eye 4 (four) times daily as needed. For 2 days after each monthly injection  1  . calcitRIOL (ROCALTROL) 0.5 MCG capsule Take 2 capsules (1 mcg total) by mouth every Monday, Wednesday, and Friday with hemodialysis.    Marland Kitchen calcium acetate (PHOSLO) 667 MG capsule Take 3 capsules by mouth three times a day 180 capsule 3  . CALCIUM CARBONATE PO Take 650 mg by mouth 3 (three) times daily.    . cinacalcet (SENSIPAR) 30 MG tablet Take 2 tablets (60 mg total) by mouth daily with supper. 30 tablet 0  .  colchicine 0.6 MG tablet Take 0.6 mg by mouth as needed.     . cyclobenzaprine (FLEXERIL) 5 MG tablet Take one tablet by mouth once daily as needed for spasms 30 tablet 0  . darbepoetin (ARANESP) 60 MCG/0.3ML SOLN injection Inject 0.3 mLs (60 mcg total) into the vein every Thursday with hemodialysis. (Patient taking differently: Inject 60 mcg into the vein every 7 (seven) days. ) 4.2 mL   . Diphenhydramine-APAP 25-500 MG TABS Take 1 tablet by mouth at bedtime as needed.     . divalproex (DEPAKOTE) 250 MG DR tablet Take 1 tablet (250 mg total) by  mouth every 12 (twelve) hours. 60 tablet 0  . ferrous sulfate 325 (65 FE) MG tablet Take 325 mg by mouth 2 (two) times daily with a meal.    . fexofenadine (ALLEGRA) 180 MG tablet Take 180 mg by mouth daily. For allergies    . folic acid (FOLVITE) 1 MG tablet Take 1 tablet (1 mg total) by mouth daily. 30 tablet 0  . gabapentin (NEURONTIN) 100 MG capsule Take 1 capsule (100 mg total) by mouth daily. 30 capsule 0  . glucose blood (ACCU-CHEK AVIVA PLUS) test strip Check blood sugar three times daily 100 each 3  . HUMALOG KWIKPEN 100 UNIT/ML KiwkPen Inject 0-10 Units into the skin 3 (three) times daily. Sliding Scale  0  . lacosamide (VIMPAT) 50 MG TABS tablet Take 1 tablet (50 mg total) by mouth every Monday, Wednesday, and Friday with hemodialysis. 15 tablet 1  . Lacosamide 100 MG TABS Take 1 tablet (100 mg total) by mouth 2 (two) times daily. 60 tablet 3  . multivitamin (RENA-VIT) TABS tablet Take 1 tablet by mouth at bedtime. 30 tablet 0  . pantoprazole (PROTONIX) 20 MG tablet Take 20 mg by mouth 2 (two) times daily.    Marland Kitchen senna-docusate (SENOKOT-S) 8.6-50 MG per tablet Take 1 tablet by mouth. As needed    . sodium bicarbonate 650 MG tablet Take 650 mg by mouth 2 (two) times daily.    . sodium chloride 0.9 % SOLN 100 mL with ferric gluconate 12.5 MG/ML SOLN 62.5 mg Inject 62.5 mg into the vein every Thursday with hemodialysis.    Marland Kitchen triamcinolone (NASACORT ALLERGY 24HR) 55 MCG/ACT AERO nasal inhaler Place 1 spray into the nose. One spray nasal as needed for allery     No current facility-administered medications for this visit.   Facility-Administered Medications Ordered in Other Visits  Medication Dose Route Frequency Provider Last Rate Last Dose  . 0.9 %  sodium chloride infusion   Intravenous Continuous Sherren Kerns, MD      . cefUROXime (ZINACEF) 1.5 g in dextrose 5 % 50 mL IVPB  1.5 g Intravenous To SS-Surg Sherren Kerns, MD      . Chlorhexidine Gluconate Cloth 2 % PADS 6 each  6 each  Topical Once Sherren Kerns, MD        Allergies:   Aspirin and Lactose intolerance (gi)   Social History:  The patient  reports that he has never smoked. He has never used smokeless tobacco. He reports that he does not drink alcohol or use illicit drugs.   Family History:  The patient's family history includes Sudden death in his father and mother.    ROS:  Please see the history of present illness.   Otherwise, review of systems is positive for back pain, muscle pain, joint pain.   All other systems are reviewed and negative.  PHYSICAL EXAM: VS:  There were no vitals taken for this visit. , BMI There is no weight on file to calculate BMI. GEN: Well nourished, well developed, in no acute distress HEENT: normal Neck: no JVD, carotid bruits, or masses Cardiac: RRR; no murmurs, rubs, or gallops,no edema  Respiratory:  clear to auscultation bilaterally, normal work of breathing GI: soft, nontender, nondistended, + BS MS: no deformity or atrophy Skin: warm and dry Neuro:  Strength and sensation are intact Psych: euthymic mood, full affect  EKG:  EKG is ordered today. The ekg ordered today shows sinus rhythm, rate 85, first degree AV block, LAA  Recent Labs: 06/21/2015: TSH 1.607 07/26/2015: Magnesium 3.3* 07/27/2015: ALT 10* 07/29/2015: BUN 103*; Creatinine, Ser 11.63*; Hemoglobin 12.1*; Platelets 89*; Potassium 6.0*; Sodium 137    Lipid Panel     Component Value Date/Time   CHOL 111 05/20/2015 0313   CHOL 107 05/13/2015 0925   TRIG 63 05/20/2015 0313   HDL 53 05/20/2015 0313   HDL 66 05/13/2015 0925   CHOLHDL 2.1 05/20/2015 0313   CHOLHDL 1.6 05/13/2015 0925   VLDL 13 05/20/2015 0313   LDLCALC 45 05/20/2015 0313   LDLCALC 31 05/13/2015 0925     Wt Readings from Last 3 Encounters:  08/13/15 208 lb (94.348 kg)  07/28/15 208 lb 8.9 oz (94.6 kg)  07/21/15 204 lb 9.6 oz (92.806 kg)      Other studies Reviewed: Additional studies/ records that were reviewed  today include: TTE 01/14/13 Review of the above records today demonstrates:  - Left ventricle: The cavity size was normal. Wall thickness was increased in a pattern of mild LVH. Systolic function was normal. The estimated ejection fraction was in the range of 55% to 60%. Wall motion was normal; there were no regional wall motion abnormalities. Doppler parameters are consistent with abnormal left ventricular relaxation (grade 1 diastolic dysfunction). - Aortic valve: Moderate regurgitation. - Mitral valve: Calcified annulus. Mildly thickened leaflets . Mild regurgitation. - Left atrium: The atrium was severely dilated. - Right atrium: The atrium was mildly dilated. - Pulmonary arteries: Systolic pressure was severely increased. PA peak pressure: 14mm Hg (S).  ASSESSMENT AND PLAN:  1.  Bradycardia: Had junctional bradycardia as well as sinus bradycardia when he was admitted to the hospital.  He has an ECG with a first degree AV block without any conduction system disease.  He has no symptoms of significant bradycardia such as fatigue or weakness.  Due to that, would not recommend pacemaker placement at this time.  He is to have an AV fistula placed as well in his right arm.  Due to his HD need, would avoid pacemaker placement if possible due to the high risk of infection.    2. Presurgical evaluation:   Current medicines are reviewed at length with the patient today.   The patient does not have concerns regarding his medicines.  The following changes were made today:  none  Labs/ tests ordered today include:  No orders of the defined types were placed in this encounter.     Disposition:   FU with Will Camnitz PRN  Signed, Will Jorja Loa, MD  08/26/2015 2:14 PM     [facility] [address] [place] [address] [phone] (office) [phone] (fax)

## 2015-08-26 NOTE — Patient Instructions (Signed)
Medication Instructions:  Your physician recommends that you continue on your current medications as directed. Please refer to the Current Medication list given to you today.  Labwork: None ordered  Testing/Procedures: None ordered  Follow-Up: No follow up is needed at this time with Dr. Camnitz.  He will see you on an as needed basis.  If you need a refill on your cardiac medications before your next appointment, please call your pharmacy.  Thank you for choosing [facility]!!   Twania Bujak, RN [phone]      

## 2015-08-27 ENCOUNTER — Encounter: Payer: Self-pay | Admitting: Vascular Surgery

## 2015-08-27 ENCOUNTER — Telehealth: Payer: Self-pay | Admitting: *Deleted

## 2015-08-27 ENCOUNTER — Other Ambulatory Visit: Payer: Self-pay

## 2015-08-27 DIAGNOSIS — D509 Iron deficiency anemia, unspecified: Secondary | ICD-10-CM | POA: Diagnosis not present

## 2015-08-27 DIAGNOSIS — E1129 Type 2 diabetes mellitus with other diabetic kidney complication: Secondary | ICD-10-CM | POA: Diagnosis not present

## 2015-08-27 DIAGNOSIS — D689 Coagulation defect, unspecified: Secondary | ICD-10-CM | POA: Diagnosis not present

## 2015-08-27 DIAGNOSIS — N186 End stage renal disease: Secondary | ICD-10-CM | POA: Diagnosis not present

## 2015-08-27 NOTE — Telephone Encounter (Signed)
Faxed surgical clearance to [facility]. Patient cleared from cardiac standpoint for AV fistula placement.  Faxed yesterday

## 2015-08-28 DIAGNOSIS — N186 End stage renal disease: Secondary | ICD-10-CM | POA: Diagnosis not present

## 2015-08-28 DIAGNOSIS — M1990 Unspecified osteoarthritis, unspecified site: Secondary | ICD-10-CM | POA: Diagnosis not present

## 2015-08-28 DIAGNOSIS — G40909 Epilepsy, unspecified, not intractable, without status epilepticus: Secondary | ICD-10-CM | POA: Diagnosis not present

## 2015-08-28 DIAGNOSIS — Z981 Arthrodesis status: Secondary | ICD-10-CM | POA: Diagnosis not present

## 2015-08-28 DIAGNOSIS — S065X0S Traumatic subdural hemorrhage without loss of consciousness, sequela: Secondary | ICD-10-CM | POA: Diagnosis not present

## 2015-08-28 DIAGNOSIS — W1809XS Striking against other object with subsequent fall, sequela: Secondary | ICD-10-CM | POA: Diagnosis not present

## 2015-08-28 DIAGNOSIS — R4189 Other symptoms and signs involving cognitive functions and awareness: Secondary | ICD-10-CM | POA: Diagnosis not present

## 2015-08-28 DIAGNOSIS — M545 Low back pain: Secondary | ICD-10-CM | POA: Diagnosis not present

## 2015-08-28 DIAGNOSIS — D631 Anemia in chronic kidney disease: Secondary | ICD-10-CM | POA: Diagnosis not present

## 2015-08-28 DIAGNOSIS — Z992 Dependence on renal dialysis: Secondary | ICD-10-CM | POA: Diagnosis not present

## 2015-08-28 DIAGNOSIS — I120 Hypertensive chronic kidney disease with stage 5 chronic kidney disease or end stage renal disease: Secondary | ICD-10-CM | POA: Diagnosis not present

## 2015-08-28 DIAGNOSIS — E1122 Type 2 diabetes mellitus with diabetic chronic kidney disease: Secondary | ICD-10-CM | POA: Diagnosis not present

## 2015-08-28 DIAGNOSIS — Z794 Long term (current) use of insulin: Secondary | ICD-10-CM | POA: Diagnosis not present

## 2015-08-29 DIAGNOSIS — E1129 Type 2 diabetes mellitus with other diabetic kidney complication: Secondary | ICD-10-CM | POA: Diagnosis not present

## 2015-08-29 DIAGNOSIS — D509 Iron deficiency anemia, unspecified: Secondary | ICD-10-CM | POA: Diagnosis not present

## 2015-08-29 DIAGNOSIS — N186 End stage renal disease: Secondary | ICD-10-CM | POA: Diagnosis not present

## 2015-08-29 DIAGNOSIS — D689 Coagulation defect, unspecified: Secondary | ICD-10-CM | POA: Diagnosis not present

## 2015-09-01 DIAGNOSIS — D509 Iron deficiency anemia, unspecified: Secondary | ICD-10-CM | POA: Diagnosis not present

## 2015-09-01 DIAGNOSIS — E1129 Type 2 diabetes mellitus with other diabetic kidney complication: Secondary | ICD-10-CM | POA: Diagnosis not present

## 2015-09-01 DIAGNOSIS — N186 End stage renal disease: Secondary | ICD-10-CM | POA: Diagnosis not present

## 2015-09-01 DIAGNOSIS — D689 Coagulation defect, unspecified: Secondary | ICD-10-CM | POA: Diagnosis not present

## 2015-09-02 DIAGNOSIS — R4189 Other symptoms and signs involving cognitive functions and awareness: Secondary | ICD-10-CM | POA: Diagnosis not present

## 2015-09-02 DIAGNOSIS — G40909 Epilepsy, unspecified, not intractable, without status epilepticus: Secondary | ICD-10-CM | POA: Diagnosis not present

## 2015-09-02 DIAGNOSIS — Z794 Long term (current) use of insulin: Secondary | ICD-10-CM | POA: Diagnosis not present

## 2015-09-02 DIAGNOSIS — W1809XS Striking against other object with subsequent fall, sequela: Secondary | ICD-10-CM | POA: Diagnosis not present

## 2015-09-02 DIAGNOSIS — N186 End stage renal disease: Secondary | ICD-10-CM | POA: Diagnosis not present

## 2015-09-02 DIAGNOSIS — Z981 Arthrodesis status: Secondary | ICD-10-CM | POA: Diagnosis not present

## 2015-09-02 DIAGNOSIS — I120 Hypertensive chronic kidney disease with stage 5 chronic kidney disease or end stage renal disease: Secondary | ICD-10-CM | POA: Diagnosis not present

## 2015-09-02 DIAGNOSIS — S065X0S Traumatic subdural hemorrhage without loss of consciousness, sequela: Secondary | ICD-10-CM | POA: Diagnosis not present

## 2015-09-02 DIAGNOSIS — E1122 Type 2 diabetes mellitus with diabetic chronic kidney disease: Secondary | ICD-10-CM | POA: Diagnosis not present

## 2015-09-02 DIAGNOSIS — Z992 Dependence on renal dialysis: Secondary | ICD-10-CM | POA: Diagnosis not present

## 2015-09-02 DIAGNOSIS — D631 Anemia in chronic kidney disease: Secondary | ICD-10-CM | POA: Diagnosis not present

## 2015-09-02 DIAGNOSIS — M545 Low back pain: Secondary | ICD-10-CM | POA: Diagnosis not present

## 2015-09-02 DIAGNOSIS — M1990 Unspecified osteoarthritis, unspecified site: Secondary | ICD-10-CM | POA: Diagnosis not present

## 2015-09-03 ENCOUNTER — Encounter (HOSPITAL_COMMUNITY): Payer: Self-pay | Admitting: *Deleted

## 2015-09-03 DIAGNOSIS — M1990 Unspecified osteoarthritis, unspecified site: Secondary | ICD-10-CM | POA: Diagnosis not present

## 2015-09-03 DIAGNOSIS — Z981 Arthrodesis status: Secondary | ICD-10-CM | POA: Diagnosis not present

## 2015-09-03 DIAGNOSIS — W1809XS Striking against other object with subsequent fall, sequela: Secondary | ICD-10-CM | POA: Diagnosis not present

## 2015-09-03 DIAGNOSIS — R4189 Other symptoms and signs involving cognitive functions and awareness: Secondary | ICD-10-CM | POA: Diagnosis not present

## 2015-09-03 DIAGNOSIS — E1122 Type 2 diabetes mellitus with diabetic chronic kidney disease: Secondary | ICD-10-CM | POA: Diagnosis not present

## 2015-09-03 DIAGNOSIS — N186 End stage renal disease: Secondary | ICD-10-CM | POA: Diagnosis not present

## 2015-09-03 DIAGNOSIS — M545 Low back pain: Secondary | ICD-10-CM | POA: Diagnosis not present

## 2015-09-03 DIAGNOSIS — S065X0S Traumatic subdural hemorrhage without loss of consciousness, sequela: Secondary | ICD-10-CM | POA: Diagnosis not present

## 2015-09-03 DIAGNOSIS — Z992 Dependence on renal dialysis: Secondary | ICD-10-CM | POA: Diagnosis not present

## 2015-09-03 DIAGNOSIS — Z794 Long term (current) use of insulin: Secondary | ICD-10-CM | POA: Diagnosis not present

## 2015-09-03 DIAGNOSIS — D631 Anemia in chronic kidney disease: Secondary | ICD-10-CM | POA: Diagnosis not present

## 2015-09-03 DIAGNOSIS — G40909 Epilepsy, unspecified, not intractable, without status epilepticus: Secondary | ICD-10-CM | POA: Diagnosis not present

## 2015-09-03 DIAGNOSIS — I120 Hypertensive chronic kidney disease with stage 5 chronic kidney disease or end stage renal disease: Secondary | ICD-10-CM | POA: Diagnosis not present

## 2015-09-03 NOTE — Progress Notes (Addendum)
Anesthesia Follow-up: See my note from 08/25/15. LUE AVGG insertion was delayed until he could be re-evaluated by cardiology. Since then patient was seen by Dr. Loman Brooklyn with [facility] on 08/26/15. He does not recommend pacemaker placement at this time for bradycardia/junctional bradycardia. Being a dialysis patient also places him at high risk for infection, so he would prefer to avoid PPM placement. Surgery has been rescheduled for 09/09/15 with Dr. Darrick Penna.  08/26/15 EKG ([facility]): SR with first degree AVB. T wave abnormality, consider inferior ischemia.  He signed a note of cardiac clearance (scanned under Media tab) indicating patient is at moderate risk, noting that anesthesiologist should be aware of his pulmonary hypertension history (severe by 2014 echo).   He is for labs on arrival. He is already on HD.  Velna [facility] Stay [facility]/Anesthesiology Phone [phone] 09/03/2015 6:17 PM

## 2015-09-03 NOTE — Progress Notes (Signed)
Pt SDW-pre-op call completed by pt son, [name] ( see pt call instructions). Son made aware to administer Lispro Humalog Insulin with meals as instructed ( SS  0-10 units), do not administer a bedtime dose the night before procedure or AM dose on DOS. Son made aware of BS protocol for BS < 70 and > 220. Son made aware to stop otc vitamins, fish oil, NSAIDs and herbal medications. Son verbalized understanding of all pre-op instructions.

## 2015-09-04 DIAGNOSIS — D631 Anemia in chronic kidney disease: Secondary | ICD-10-CM | POA: Diagnosis not present

## 2015-09-04 DIAGNOSIS — G40909 Epilepsy, unspecified, not intractable, without status epilepticus: Secondary | ICD-10-CM | POA: Diagnosis not present

## 2015-09-04 DIAGNOSIS — E1122 Type 2 diabetes mellitus with diabetic chronic kidney disease: Secondary | ICD-10-CM | POA: Diagnosis not present

## 2015-09-04 DIAGNOSIS — W1809XS Striking against other object with subsequent fall, sequela: Secondary | ICD-10-CM | POA: Diagnosis not present

## 2015-09-04 DIAGNOSIS — Z794 Long term (current) use of insulin: Secondary | ICD-10-CM | POA: Diagnosis not present

## 2015-09-04 DIAGNOSIS — Z981 Arthrodesis status: Secondary | ICD-10-CM | POA: Diagnosis not present

## 2015-09-04 DIAGNOSIS — Z992 Dependence on renal dialysis: Secondary | ICD-10-CM | POA: Diagnosis not present

## 2015-09-04 DIAGNOSIS — I120 Hypertensive chronic kidney disease with stage 5 chronic kidney disease or end stage renal disease: Secondary | ICD-10-CM | POA: Diagnosis not present

## 2015-09-04 DIAGNOSIS — M545 Low back pain: Secondary | ICD-10-CM | POA: Diagnosis not present

## 2015-09-04 DIAGNOSIS — M1990 Unspecified osteoarthritis, unspecified site: Secondary | ICD-10-CM | POA: Diagnosis not present

## 2015-09-04 DIAGNOSIS — N186 End stage renal disease: Secondary | ICD-10-CM | POA: Diagnosis not present

## 2015-09-04 DIAGNOSIS — R4189 Other symptoms and signs involving cognitive functions and awareness: Secondary | ICD-10-CM | POA: Diagnosis not present

## 2015-09-04 DIAGNOSIS — S065X0S Traumatic subdural hemorrhage without loss of consciousness, sequela: Secondary | ICD-10-CM | POA: Diagnosis not present

## 2015-09-05 ENCOUNTER — Emergency Department (HOSPITAL_COMMUNITY)
Admission: EM | Admit: 2015-09-05 | Discharge: 2015-09-05 | Disposition: A | Discharge status: Home / Self Care | Payer: Medicare Other | Attending: Emergency Medicine | Admitting: Emergency Medicine

## 2015-09-05 ENCOUNTER — Other Ambulatory Visit: Payer: Self-pay

## 2015-09-05 ENCOUNTER — Emergency Department (HOSPITAL_COMMUNITY): Payer: Medicare Other

## 2015-09-05 ENCOUNTER — Encounter (HOSPITAL_COMMUNITY): Payer: Self-pay

## 2015-09-05 DIAGNOSIS — Z992 Dependence on renal dialysis: Secondary | ICD-10-CM | POA: Diagnosis not present

## 2015-09-05 DIAGNOSIS — D509 Iron deficiency anemia, unspecified: Secondary | ICD-10-CM | POA: Diagnosis not present

## 2015-09-05 DIAGNOSIS — Z9981 Dependence on supplemental oxygen: Secondary | ICD-10-CM | POA: Diagnosis not present

## 2015-09-05 DIAGNOSIS — N186 End stage renal disease: Secondary | ICD-10-CM | POA: Diagnosis not present

## 2015-09-05 DIAGNOSIS — D689 Coagulation defect, unspecified: Secondary | ICD-10-CM | POA: Diagnosis not present

## 2015-09-05 DIAGNOSIS — G4730 Sleep apnea, unspecified: Secondary | ICD-10-CM | POA: Insufficient documentation

## 2015-09-05 DIAGNOSIS — R402411 Glasgow coma scale score 13-15, in the field [EMT or ambulance]: Secondary | ICD-10-CM | POA: Diagnosis not present

## 2015-09-05 DIAGNOSIS — I120 Hypertensive chronic kidney disease with stage 5 chronic kidney disease or end stage renal disease: Secondary | ICD-10-CM | POA: Diagnosis not present

## 2015-09-05 DIAGNOSIS — R569 Unspecified convulsions: Secondary | ICD-10-CM | POA: Insufficient documentation

## 2015-09-05 DIAGNOSIS — Z79899 Other long term (current) drug therapy: Secondary | ICD-10-CM | POA: Diagnosis not present

## 2015-09-05 DIAGNOSIS — E119 Type 2 diabetes mellitus without complications: Secondary | ICD-10-CM | POA: Diagnosis not present

## 2015-09-05 DIAGNOSIS — Z8719 Personal history of other diseases of the digestive system: Secondary | ICD-10-CM | POA: Diagnosis not present

## 2015-09-05 DIAGNOSIS — Z8739 Personal history of other diseases of the musculoskeletal system and connective tissue: Secondary | ICD-10-CM | POA: Diagnosis not present

## 2015-09-05 DIAGNOSIS — G40909 Epilepsy, unspecified, not intractable, without status epilepticus: Secondary | ICD-10-CM | POA: Diagnosis not present

## 2015-09-05 DIAGNOSIS — E1129 Type 2 diabetes mellitus with other diabetic kidney complication: Secondary | ICD-10-CM | POA: Diagnosis not present

## 2015-09-05 DIAGNOSIS — Z87828 Personal history of other (healed) physical injury and trauma: Secondary | ICD-10-CM | POA: Insufficient documentation

## 2015-09-05 LAB — CBC WITH DIFFERENTIAL/PLATELET
BASOS ABS: 0 10*3/uL (ref 0.0–0.1)
BASOS PCT: 1 %
EOS ABS: 0.1 10*3/uL (ref 0.0–0.7)
EOS PCT: 4 %
HCT: 32.8 % — ABNORMAL LOW (ref 39.0–52.0)
Hemoglobin: 11.1 g/dL — ABNORMAL LOW (ref 13.0–17.0)
LYMPHS PCT: 40 %
Lymphs Abs: 1.2 10*3/uL (ref 0.7–4.0)
MCH: 32.6 pg (ref 26.0–34.0)
MCHC: 33.8 g/dL (ref 30.0–36.0)
MCV: 96.2 fL (ref 78.0–100.0)
MONO ABS: 0.2 10*3/uL (ref 0.1–1.0)
Monocytes Relative: 8 %
Neutro Abs: 1.4 10*3/uL — ABNORMAL LOW (ref 1.7–7.7)
Neutrophils Relative %: 48 %
PLATELETS: 60 10*3/uL — AB (ref 150–400)
RBC: 3.41 MIL/uL — AB (ref 4.22–5.81)
RDW: 15.3 % (ref 11.5–15.5)
WBC: 3 10*3/uL — AB (ref 4.0–10.5)

## 2015-09-05 LAB — BASIC METABOLIC PANEL
Anion gap: 14 (ref 5–15)
BUN: 58 mg/dL — AB (ref 6–20)
CALCIUM: 8.9 mg/dL (ref 8.9–10.3)
CO2: 23 mmol/L (ref 22–32)
CREATININE: 6.62 mg/dL — AB (ref 0.61–1.24)
Chloride: 101 mmol/L (ref 101–111)
GFR calc Af Amer: 8 mL/min — ABNORMAL LOW (ref >60)
GFR, EST NON AFRICAN AMERICAN: 7 mL/min — AB (ref >60)
GLUCOSE: 100 mg/dL — AB (ref 65–99)
Potassium: 4.5 mmol/L (ref 3.5–5.1)
SODIUM: 138 mmol/L (ref 135–145)

## 2015-09-05 MED ORDER — DIVALPROEX SODIUM 500 MG PO DR TAB: 500.0000 mg | DELAYED_RELEASE_TABLET | Freq: Two times a day (BID) | ORAL | Status: DC

## 2015-09-05 NOTE — Discharge Instructions (Signed)

## 2015-09-05 NOTE — ED Notes (Signed)
Pt. Coming from dialysis via [facility] after a tonic clonic seizure ~30 minute ago. Pt. Received 3 hours of dialysis when he started seizing. Dialysis reports seizure lasting around 1 minute. Upon EMS arrival, Pt. Was lethargic and only oriented to person. Pt. Put on 4L O2 [place] en route. No medication given. Pt. Hx of seizures. Pt. Currently AOx2 (person and place).

## 2015-09-05 NOTE — ED Provider Notes (Signed)
CSN: [id number]     Arrival date & time 09/05/15  1230 History   First MD Initiated Contact with Patient 09/05/15 1232     Chief Complaint  Patient presents with  . Seizures     (Consider location/radiation/quality/duration/timing/severity/associated sxs/prior Treatment) HPI Comments: 81-year-old male with past medical history including ESRD on HD, hypertension, type 2 diabetes mellitus, pulmonary hypertension, seizure disorder, subdural hematoma presents with seizure and confusion. History limited because of the patient's altered mentation and obtained from EMS and wife. Patient had his routine dialysis session today and received 3 hours of dialysis when he started having a generalized seizure. The seizure reportedly lasted approximately 1 minute after which it spontaneously resolved. On EMS arrival, the patient was sleepy and oriented only to person. Wife states that the patient has been in his usual state of health recently w/ no recent fevers or illness. Patient currently denies any complaints. He denies any headache or visual changes. Wife denies any recent falls or head injury since his last hospitalization. She reports his last seizure was in the hospital and he has not had any in a few months. He endorses compliance with medication.  Patient is a 81 y.o. male presenting with seizures. The history is provided by the patient and the spouse.  Seizures   Past Medical History  Diagnosis Date  . Hypertension   . Diabetes mellitus without complication (HCC)   . Arthritis   . History of blood transfusion   . GI bleed   . ESRD (end stage renal disease) (HCC)   . Seizure (HCC)   . Dialysis patient (HCC)   . Subdural hematoma (HCC) 05/2015    from fall  . Sleep apnea     wears CPAP n/c  . Pulmonary hypertension (HCC)     severe pulmonary HTN by 01/14/13 echo   Past Surgical History  Procedure Laterality Date  . Esophagogastroduodenoscopy  10/03/2012    Procedure:  ESOPHAGOGASTRODUODENOSCOPY (EGD);  Surgeon: Theda Belfast, MD;  Location: [facility] ENDOSCOPY;  Service: Endoscopy;  Laterality: N/A;  . Colonoscopy  10/04/2012    Procedure: COLONOSCOPY;  Surgeon: Theda Belfast, MD;  Location: [facility] ENDOSCOPY;  Service: Endoscopy;  Laterality: N/A;  . Colonoscopy with esophagogastroduodenoscopy (egd) Left 11/30/2012    Procedure: COLONOSCOPY WITH ESOPHAGOGASTRODUODENOSCOPY (EGD);  Surgeon: Theda Belfast, MD;  Location: [facility] ENDOSCOPY;  Service: Endoscopy;  Laterality: Left;  . Spine surgery  2004    back fusion/ [facility] Penumalli,MD  . Retinal laser procedure Right 04/2015   Family History  Problem Relation Age of Onset  . Sudden death Mother   . Sudden death Father    Social History  Substance Use Topics  . Smoking status: Never Smoker   . Smokeless tobacco: Never Used  . Alcohol Use: No    Review of Systems  Unable to perform ROS: Mental status change  Neurological: Positive for seizures.      Allergies  Aspirin and Lactose intolerance (gi)  Home Medications   Prior to Admission medications   Medication Sig Start Date End Date Taking? Authorizing Provider  amLODipine (NORVASC) 10 MG tablet Take 10 mg by mouth Daily.  08/15/12  Yes Historical Provider, MD  calcium acetate (PHOSLO) 667 MG capsule Take 3 capsules by mouth three times a day 07/15/15  Yes Sharon Seller, NP  cinacalcet (SENSIPAR) 30 MG tablet Take 2 tablets (60 mg total) by mouth daily with supper. 06/22/15  Yes Catarina Hartshorn, MD  darbepoetin (ARANESP) 60 MCG/0.3ML SOLN injection  Inject 0.3 mLs (60 mcg total) into the vein every Thursday with hemodialysis. Patient taking differently: Inject 60 mcg into the vein every 7 (seven) days.  09/29/13  Yes Joseph Art, DO  ferrous sulfate 325 (65 FE) MG tablet Take 325 mg by mouth 2 (two) times daily with a meal.   Yes Historical Provider, MD  folic acid (FOLVITE) 1 MG tablet Take 1 tablet (1 mg total) by mouth daily. 09/29/13  Yes Joseph Art, DO   gabapentin (NEURONTIN) 100 MG capsule Take 1 capsule (100 mg total) by mouth daily. 05/23/15  Yes Lonia Blood, MD  HUMALOG KWIKPEN 100 UNIT/ML KiwkPen Inject 0-10 Units into the skin 3 (three) times daily. Sliding Scale 04/16/15  Yes Historical Provider, MD  lacosamide (VIMPAT) 50 MG TABS tablet Take 1 tablet (50 mg total) by mouth every Monday, Wednesday, and Friday with hemodialysis. 07/28/15  Yes Joseph Art, DO  Lacosamide 100 MG TABS Take 1 tablet (100 mg total) by mouth 2 (two) times daily. 07/21/15  Yes [name] L [name], DO  multivitamin (RENA-VIT) TABS tablet Take 1 tablet by mouth at bedtime. 09/29/13  Yes Jessica U Vann, DO  senna-docusate (SENOKOT-S) 8.6-50 MG per tablet Take 1 tablet by mouth. As needed   Yes Historical Provider, MD  sodium chloride 0.9 % SOLN 100 mL with ferric gluconate 12.5 MG/ML SOLN 62.5 mg Inject 62.5 mg into the vein every Thursday with hemodialysis. 09/29/13  Yes Joseph Art, DO  triamcinolone (NASACORT ALLERGY 24HR) 55 MCG/ACT AERO nasal inhaler Place 1 spray into the nose. One spray nasal as needed for allery   Yes Historical Provider, MD  calcitRIOL (ROCALTROL) 0.5 MCG capsule Take 2 capsules (1 mcg total) by mouth every Monday, Wednesday, and Friday with hemodialysis. Patient not taking: Reported on 09/05/2015 05/23/15   Lonia Blood, MD  colchicine 0.6 MG tablet Take 0.6 mg by mouth as needed. Reported on 09/05/2015 02/14/14   Historical Provider, MD  cyclobenzaprine (FLEXERIL) 5 MG tablet Take one tablet by mouth once daily as needed for spasms Patient not taking: Reported on 09/05/2015 08/04/15   [name] L [name], DO  Diphenhydramine-APAP 25-500 MG TABS Take 1 tablet by mouth at bedtime as needed. Reported on 09/05/2015    Historical Provider, MD  divalproex (DEPAKOTE) 500 MG DR tablet Take 1 tablet (500 mg total) by mouth 2 (two) times daily. 09/05/15   Laurence Spates, MD  fexofenadine (ALLEGRA) 180 MG tablet Take 180 mg by mouth daily as  needed. Reported on 09/05/2015    Historical Provider, MD   BP 149/89 mmHg  Pulse 62  Temp(Src) 97.8 F (36.6 C) (Oral)  Resp 16  SpO2 96% Physical Exam  Constitutional: He appears well-developed and well-nourished. No distress.  Awake, alert  HENT:  Head: Normocephalic and atraumatic.  Eyes: Conjunctivae and EOM are normal. Pupils are equal, round, and reactive to light.  Neck: Neck supple.  Cardiovascular: Normal rate, regular rhythm and normal heart sounds.   No murmur heard. Pulmonary/Chest: Effort normal and breath sounds normal. No respiratory distress.  Abdominal: Soft. Bowel sounds are normal. He exhibits no distension.  Musculoskeletal: He exhibits no edema.  Neurological: He is alert. He has normal reflexes. No cranial nerve deficit. He exhibits normal muscle tone.  Oriented to person and place, difficulty following 2-step directions therefore difficult to assess finger-to-nose; R pronator drift; normal sensation throughout  Skin: Skin is warm and dry.  Psychiatric: He has a normal mood and affect.  Judgment and thought content normal.  Nursing note and vitals reviewed.   ED Course  Procedures (including critical care time) Labs Review Labs Reviewed  BASIC METABOLIC PANEL - Abnormal; Notable for the following:    Glucose, Bld 100 (*)    BUN 58 (*)    Creatinine, Ser 6.62 (*)    GFR calc non Af Amer 7 (*)    GFR calc Af Amer 8 (*)    All other components within normal limits  CBC WITH DIFFERENTIAL/PLATELET - Abnormal; Notable for the following:    WBC 3.0 (*)    RBC 3.41 (*)    Hemoglobin 11.1 (*)    HCT 32.8 (*)    Platelets 60 (*)    Neutro Abs 1.4 (*)    All other components within normal limits    Imaging Review Ct Head Wo Contrast  09/05/2015  CLINICAL DATA:  Recent seizure activity EXAM: CT HEAD WITHOUT CONTRAST TECHNIQUE: Contiguous axial images were obtained from the base of the skull through the vertex without intravenous contrast. COMPARISON:   07/28/2015 FINDINGS: The bony calvarium is intact. No gross soft tissue abnormality is noted. Mild atrophic changes are seen. The previously seen subdural hematoma on the left has resolved in the interval. No significant midline shift is noted. Chronic ischemic changes are seen. No acute infarct, acute hemorrhage or space-occupying mass lesion is noted. IMPRESSION: Resolution of previously seen left-sided subdural hematoma No acute abnormality noted. Electronically Signed   By: Alcide Clever M.D.   On: 09/05/2015 14:05   I have personally reviewed and evaluated these lab results as part of my medical decision-making.   EKG Interpretation   Date/Time:  Friday September 05 2015 12:34:13 EST Ventricular Rate:  72 PR Interval:  222 QRS Duration: 85 QT Interval:  410 QTC Calculation: 449 R Axis:   41 Text Interpretation:  Sinus rhythm Prolonged PR interval Abnormal R-wave  progression, early transition Nonspecific T abnormalities, lateral leads  Minimal ST elevation, anterior leads T wave inversions in inferior and  lateral leads Confirmed by Mikella Linsley MD, Lincon Sahlin ([id number]) on 09/05/2015 1:26:08  PM      MDM   Final diagnoses:  Seizure (HCC)   Pt w/ h/o sz d/o resents after a brief seizure that occurred at dialysis today. On exam, the patient was awake and comfortable. He was oriented to person and place but had difficulty following directions, therefore neurologic exam was limited. R pronator drift noted. Because of his history of subdural hematoma, obtained CT of head as well as basic labs.  Labs are reassuring, CT shows resolution of previous SDH. On repeat examination, pt was A&Ox3, deficits on original neuro exam including confusion had resolved. Suspect his previous symptoms were due to post ictal state. I discussed patient with neurology, Dr. Roseanne Reno, and per his recommendation increased the patient's Depakote to 500 mg twice a day. I discussed this medication change with the patient, his  wife, and his son over the phone. They voiced understanding. Instructed to contact his primary neurologist to schedule follow-up appointment. Return precautions reviewed and patient discharged in satisfactory condition.  Laurence Spates, MD 09/06/15 [phone]

## 2015-09-06 DIAGNOSIS — I120 Hypertensive chronic kidney disease with stage 5 chronic kidney disease or end stage renal disease: Secondary | ICD-10-CM | POA: Diagnosis not present

## 2015-09-06 DIAGNOSIS — Z992 Dependence on renal dialysis: Secondary | ICD-10-CM | POA: Diagnosis not present

## 2015-09-06 DIAGNOSIS — N186 End stage renal disease: Secondary | ICD-10-CM | POA: Diagnosis not present

## 2015-09-08 DIAGNOSIS — E1129 Type 2 diabetes mellitus with other diabetic kidney complication: Secondary | ICD-10-CM | POA: Diagnosis not present

## 2015-09-08 DIAGNOSIS — D631 Anemia in chronic kidney disease: Secondary | ICD-10-CM | POA: Diagnosis not present

## 2015-09-08 DIAGNOSIS — D689 Coagulation defect, unspecified: Secondary | ICD-10-CM | POA: Diagnosis not present

## 2015-09-08 DIAGNOSIS — N186 End stage renal disease: Secondary | ICD-10-CM | POA: Diagnosis not present

## 2015-09-09 ENCOUNTER — Ambulatory Visit (HOSPITAL_COMMUNITY): Payer: Medicare Other | Admitting: Vascular Surgery

## 2015-09-09 ENCOUNTER — Encounter (HOSPITAL_COMMUNITY)
Admission: RE | Disposition: A | Discharge status: Home / Self Care | Payer: Self-pay | Source: Ambulatory Visit | Attending: Vascular Surgery

## 2015-09-09 ENCOUNTER — Ambulatory Visit (HOSPITAL_COMMUNITY)
Admission: RE | Admit: 2015-09-09 | Discharge: 2015-09-09 | Disposition: A | Discharge status: Home / Self Care | Payer: Medicare Other | Source: Ambulatory Visit | Attending: Vascular Surgery | Admitting: Vascular Surgery

## 2015-09-09 ENCOUNTER — Encounter (HOSPITAL_COMMUNITY): Payer: Self-pay | Admitting: *Deleted

## 2015-09-09 DIAGNOSIS — I120 Hypertensive chronic kidney disease with stage 5 chronic kidney disease or end stage renal disease: Secondary | ICD-10-CM | POA: Diagnosis not present

## 2015-09-09 DIAGNOSIS — Z992 Dependence on renal dialysis: Secondary | ICD-10-CM | POA: Diagnosis not present

## 2015-09-09 DIAGNOSIS — N186 End stage renal disease: Secondary | ICD-10-CM | POA: Insufficient documentation

## 2015-09-09 DIAGNOSIS — Z886 Allergy status to analgesic agent status: Secondary | ICD-10-CM | POA: Diagnosis not present

## 2015-09-09 DIAGNOSIS — N185 Chronic kidney disease, stage 5: Secondary | ICD-10-CM | POA: Diagnosis not present

## 2015-09-09 DIAGNOSIS — M1990 Unspecified osteoarthritis, unspecified site: Secondary | ICD-10-CM | POA: Diagnosis not present

## 2015-09-09 DIAGNOSIS — E1122 Type 2 diabetes mellitus with diabetic chronic kidney disease: Secondary | ICD-10-CM | POA: Insufficient documentation

## 2015-09-09 HISTORY — PX: AV FISTULA PLACEMENT: SHX1204

## 2015-09-09 LAB — POCT I-STAT 4, (NA,K, GLUC, HGB,HCT)
Glucose, Bld: 133 mg/dL — ABNORMAL HIGH (ref 65–99)
HCT: 32 % — ABNORMAL LOW (ref 39.0–52.0)
HEMOGLOBIN: 10.9 g/dL — AB (ref 13.0–17.0)
Potassium: 4.8 mmol/L (ref 3.5–5.1)
SODIUM: 140 mmol/L (ref 135–145)

## 2015-09-09 LAB — GLUCOSE, CAPILLARY
Glucose-Capillary: 130 mg/dL — ABNORMAL HIGH (ref 65–99)
Glucose-Capillary: 116 mg/dL — ABNORMAL HIGH (ref 65–99)

## 2015-09-09 SURGERY — INSERTION OF ARTERIOVENOUS (AV) GORE-TEX GRAFT ARM
Anesthesia: General | Site: Arm Upper | Laterality: Left

## 2015-09-09 MED ORDER — LIDOCAINE HCL (CARDIAC) 20 MG/ML IV SOLN
INTRAVENOUS | Status: DC | PRN
  Administered 2015-09-09: 70 mg via INTRAVENOUS
  Administered 2015-09-09: 30 mg via INTRAVENOUS

## 2015-09-09 MED ORDER — LIDOCAINE HCL (PF) 1 % IJ SOLN
INTRAMUSCULAR | Status: AC
  Filled 2015-09-09: qty 30

## 2015-09-09 MED ORDER — SODIUM CHLORIDE 0.9 % IV SOLN
INTRAVENOUS | Status: DC
  Administered 2015-09-09: 07:00:00 via INTRAVENOUS

## 2015-09-09 MED ORDER — LIDOCAINE HCL (CARDIAC) 20 MG/ML IV SOLN
INTRAVENOUS | Status: AC
Start: 2015-09-09 — End: 2015-09-09
  Filled 2015-09-09: qty 10

## 2015-09-09 MED ORDER — CEFUROXIME SODIUM 1.5 G IJ SOLR
INTRAMUSCULAR | Status: AC
  Administered 2015-09-09: 1.5 g via INTRAVENOUS
  Filled 2015-09-09: qty 1.5

## 2015-09-09 MED ORDER — ONDANSETRON HCL 4 MG/2ML IJ SOLN
INTRAMUSCULAR | Status: AC
Start: 2015-09-09 — End: 2015-09-09
  Filled 2015-09-09: qty 2

## 2015-09-09 MED ORDER — 0.9 % SODIUM CHLORIDE (POUR BTL) OPTIME
TOPICAL | Status: DC | PRN
  Administered 2015-09-09: 1000 mL

## 2015-09-09 MED ORDER — PHENYLEPHRINE HCL 10 MG/ML IJ SOLN
10.0000 mg | INTRAMUSCULAR | Status: DC | PRN
  Administered 2015-09-09: 10 ug/min via INTRAVENOUS

## 2015-09-09 MED ORDER — PHENYLEPHRINE 40 MCG/ML (10ML) SYRINGE FOR IV PUSH (FOR BLOOD PRESSURE SUPPORT)
PREFILLED_SYRINGE | INTRAVENOUS | Status: AC
  Filled 2015-09-09: qty 10

## 2015-09-09 MED ORDER — PROPOFOL 10 MG/ML IV BOLUS
INTRAVENOUS | Status: DC | PRN
  Administered 2015-09-09: 130 mg via INTRAVENOUS
  Administered 2015-09-09: 20 mg via INTRAVENOUS

## 2015-09-09 MED ORDER — ONDANSETRON HCL 4 MG/2ML IJ SOLN
INTRAMUSCULAR | Status: DC | PRN
  Administered 2015-09-09: 4 mg via INTRAVENOUS

## 2015-09-09 MED ORDER — HEPARIN SODIUM (PORCINE) 5000 UNIT/ML IJ SOLN
INTRAMUSCULAR | Status: DC | PRN
  Administered 2015-09-09: 500 mL

## 2015-09-09 MED ORDER — HEPARIN SODIUM (PORCINE) 1000 UNIT/ML IJ SOLN
INTRAMUSCULAR | Status: DC | PRN
  Administered 2015-09-09: 5000 [IU] via INTRAVENOUS

## 2015-09-09 MED ORDER — FENTANYL CITRATE (PF) 250 MCG/5ML IJ SOLN
INTRAMUSCULAR | Status: AC
  Filled 2015-09-09: qty 5

## 2015-09-09 MED ORDER — THROMBIN 20000 UNITS EX SOLR
CUTANEOUS | Status: AC
  Filled 2015-09-09: qty 20000

## 2015-09-09 MED ORDER — ARTIFICIAL TEARS OP OINT
TOPICAL_OINTMENT | OPHTHALMIC | Status: AC
  Filled 2015-09-09: qty 3.5

## 2015-09-09 MED ORDER — TRAMADOL HCL 50 MG PO TABS: 50.0000 mg | ORAL_TABLET | Freq: Four times a day (QID) | ORAL | Status: DC | PRN

## 2015-09-09 MED ORDER — PROTAMINE SULFATE 10 MG/ML IV SOLN
INTRAVENOUS | Status: DC | PRN
  Administered 2015-09-09: 50 mg via INTRAVENOUS

## 2015-09-09 MED ORDER — SODIUM CHLORIDE 0.9 % IV SOLN
INTRAVENOUS | Status: DC | PRN
  Administered 2015-09-09: 07:00:00 via INTRAVENOUS

## 2015-09-09 MED ORDER — HEPARIN SODIUM (PORCINE) 1000 UNIT/ML IJ SOLN
INTRAMUSCULAR | Status: AC
Start: 2015-09-09 — End: 2015-09-09
  Filled 2015-09-09: qty 1

## 2015-09-09 MED ORDER — FENTANYL CITRATE (PF) 100 MCG/2ML IJ SOLN
INTRAMUSCULAR | Status: DC | PRN
  Administered 2015-09-09: 50 ug via INTRAVENOUS
  Administered 2015-09-09: 100 ug via INTRAVENOUS
  Administered 2015-09-09 (×2): 50 ug via INTRAVENOUS

## 2015-09-09 SURGICAL SUPPLY — 35 items
ARMBAND PINK RESTRICT EXTREMIT (MISCELLANEOUS) ×2 IMPLANT
CANISTER SUCTION 2500CC (MISCELLANEOUS) ×2 IMPLANT
CANNULA VESSEL 3MM 2 BLNT TIP (CANNULA) IMPLANT
CLIP TI MEDIUM 6 (CLIP) ×2 IMPLANT
CLIP TI WIDE RED SMALL 6 (CLIP) ×2 IMPLANT
DECANTER SPIKE VIAL GLASS SM (MISCELLANEOUS) ×2 IMPLANT
ELECT REM PT RETURN 9FT ADLT (ELECTROSURGICAL) ×2
ELECTRODE REM PT RTRN 9FT ADLT (ELECTROSURGICAL) ×1 IMPLANT
GEL ULTRASOUND 20GR AQUASONIC (MISCELLANEOUS) IMPLANT
GLOVE BIO SURGEON STRL SZ7.5 (GLOVE) ×2 IMPLANT
GLOVE BIO SURGEON STRL SZ8 (GLOVE) ×1 IMPLANT
GLOVE BIOGEL PI IND STRL 6.5 (GLOVE) IMPLANT
GLOVE BIOGEL PI IND STRL 7.5 (GLOVE) IMPLANT
GLOVE BIOGEL PI IND STRL 8 (GLOVE) IMPLANT
GLOVE BIOGEL PI INDICATOR 6.5 (GLOVE) ×2
GLOVE BIOGEL PI INDICATOR 7.5 (GLOVE) ×1
GLOVE BIOGEL PI INDICATOR 8 (GLOVE) ×2
GLOVE ECLIPSE 6.5 STRL STRAW (GLOVE) ×2 IMPLANT
GOWN STRL REUS W/ TWL LRG LVL3 (GOWN DISPOSABLE) ×3 IMPLANT
GOWN STRL REUS W/TWL LRG LVL3 (GOWN DISPOSABLE) ×6
GRAFT GORETEX STRT 6X50 (Vascular Products) ×1 IMPLANT
KIT BASIN OR (CUSTOM PROCEDURE TRAY) ×2 IMPLANT
KIT ROOM TURNOVER OR (KITS) ×2 IMPLANT
LIQUID BAND (GAUZE/BANDAGES/DRESSINGS) ×2 IMPLANT
LOOP VESSEL MINI RED (MISCELLANEOUS) IMPLANT
NS IRRIG 1000ML POUR BTL (IV SOLUTION) ×2 IMPLANT
PACK CV ACCESS (CUSTOM PROCEDURE TRAY) ×2 IMPLANT
PAD ARMBOARD 7.5X6 YLW CONV (MISCELLANEOUS) ×4 IMPLANT
SPONGE SURGIFOAM ABS GEL 100 (HEMOSTASIS) IMPLANT
SUT PROLENE 6 0 CC (SUTURE) ×6 IMPLANT
SUT VIC AB 3-0 SH 27 (SUTURE) ×4
SUT VIC AB 3-0 SH 27X BRD (SUTURE) ×2 IMPLANT
SUT VICRYL 4-0 PS2 18IN ABS (SUTURE) ×4 IMPLANT
UNDERPAD 30X30 INCONTINENT (UNDERPADS AND DIAPERS) ×2 IMPLANT
WATER STERILE IRR 1000ML POUR (IV SOLUTION) ×2 IMPLANT

## 2015-09-09 NOTE — Discharge Instructions (Signed)
° ° °  09/09/2015 [name] [id number] October 01, 1931  Surgeon(s): Sherren Kerns, MD  Procedure(s): INSERTION OF ARTERIOVENOUS  GORE-TEX GRAFT Left  ARM  x Do not stick graft for 4 weeks

## 2015-09-09 NOTE — Interval H&P Note (Signed)
History and Physical Interval Note:  09/09/2015 7:30 AM  [name]  has presented today for surgery, with the diagnosis of End Stage Renal Disease N18.6  The various methods of treatment have been discussed with the patient and family. After consideration of risks, benefits and other options for treatment, the patient has consented to  Procedure(s): INSERTION OF ARTERIOVENOUS (AV) GORE-TEX GRAFT ARM (Left) as a surgical intervention .  The patient's history has been reviewed, patient examined, no change in status, stable for surgery.  I have reviewed the patient's chart and labs.  Questions were answered to the patient's satisfaction.     Fabienne Bruns

## 2015-09-09 NOTE — Op Note (Signed)
Procedure: Left Upper Arm AV graft  Preop: ESRD  Postop: ESRD  Anesthesia: General  Assistant: Doreatha Massed, PA-C  Findings:6 mm PTFE end to end to axillary vein   Procedure Details: The left upper extremity was prepped and draped in usual sterile fashion.  A longitudinal incision was then made near the antecubital crease the left arm. The incision was carried into the subcutaneous tissues down to level of the brachial artery.   Next the brachial artery was dissected free in the medial portion incision. The artery was  3-4 mm in diameter. It was fairly heavily calcified.  The vessel loops were placed proximal and distal to the planned site of arteriotomy. At this point, a longitudinal incision was made in the axilla and carried through the subcutaneous tissues and fascia to expose the axillary vein.  The nerves were protected.  The vein was approximately 4-5 mm in diameter.  It was dissected free and small side branches ligated.  Next, a subcutaneous tunnel was created connecting the upper arm to the lower arm incision in an arcing configuration over the biceps muscle.  A 6 mm PTFE graft was then brought through this subcutaneous tunnel. The patient was given 5000 units of intravenous heparin. After appropriate circulation time, the vessel loops were used to control the artery. A longitudinal opening was made in the left brachial artery.  The end of the graft was beveled and sewn end to side to the artery using a 6 0 prolene.  At completion of the anastomosis the artery was forward bled, backbled and thoroughly flushed.  The anastomosis was secured, vessel loops were released and there was palpable pulse in the graft.  The graft was clamped just above the arterial anastomosis with a fistula clamp. The graft was then pulled taut to length at the axillary incision.  The axillary vein was controlled with a fine bulldog clamp.  The vein was transected and spatulated.  The distal end of the graft was then  beveled and sewn end to end to the vein using a running 6 0 prolene.  Just prior to completion of the anastomosis, everything was forward bled, back bled and thoroughly flushed.  The anastomosis was secured and the fistula clamp removed from the proximal graft.  A thrill was immediately palpable in the graft. The patient was given 50 mg of protamine to assist with hemostasis.  After hemostasis was obtained, the subcutaneous tissues were reapproximated using a running 3-0 Vicryl suture. The skin was then closed with a 4 0 Vicryl subcuticular stitch. Dermabond was applied to the skin incisions.  The patient tolerated the procedure well and there were no complications.  Instrument sponge and needle count was correct at the end of the case.  The patient had radial doppler flow at the end of the case which augmented about 50% with clamping of the graft.  The patient was taken to the recovery room in stable condition.  Fabienne Bruns, MD [facility] Office: [phone] Pager: [phone]

## 2015-09-09 NOTE — Anesthesia Postprocedure Evaluation (Signed)
Anesthesia Post Note  Patient: [name]  Procedure(s) Performed: Procedure(s) (LRB): INSERTION OF ARTERIOVENOUS  GORE-TEX GRAFT Left  ARM (Left)  Patient location during evaluation: PACU Anesthesia Type: General Level of consciousness: awake and awake and alert Pain management: pain level controlled Vital Signs Assessment: post-procedure vital signs reviewed and stable Respiratory status: spontaneous breathing and nonlabored ventilation Anesthetic complications: no    Last Vitals:  Filed Vitals:   09/09/15 1144 09/09/15 1145  BP: 138/37   Pulse: 73 72  Temp:  36.4 C  Resp: 14 13    Last Pain:  Filed Vitals:   09/09/15 1146  PainSc: 0-No pain                 [name]

## 2015-09-09 NOTE — H&P (View-Only) (Signed)
[facility] [place] HISTORY AND PHYSICAL   History of Present Illness:  Patient is a 82 y.o. male referred by Dr. Abel Presto for placement of a new hemodialysis access. The patient is right handed.  He has had a previous right upper arm AV fistula which is now occluded.  The patient is currently on hemodialysis.  He dialyzes Monday Wednesday and Friday at [facility]. The cause of renal failure is thought to be secondary to hypertension and diabetes.  Other chronic medical problems include hypertension diabetes arthritis history of GI bleed and had a seizure most recent one at dialysis earlier this week. Otherwise his medical problems have been stable..  Past Medical History  Diagnosis Date  . Hypertension   . Diabetes mellitus without complication (HCC)   . Arthritis   . History of blood transfusion   . GI bleed   . ESRD (end stage renal disease) (HCC)   . Seizure (HCC)   . Dialysis patient (HCC)   . Subdural hematoma (HCC) 05/2015    from fall    Past Surgical History  Procedure Laterality Date  . Esophagogastroduodenoscopy  10/03/2012    Procedure: ESOPHAGOGASTRODUODENOSCOPY (EGD);  Surgeon: Theda Belfast, MD;  Location: [facility] ENDOSCOPY;  Service: Endoscopy;  Laterality: N/A;  . Colonoscopy  10/04/2012    Procedure: COLONOSCOPY;  Surgeon: Theda Belfast, MD;  Location: [facility] ENDOSCOPY;  Service: Endoscopy;  Laterality: N/A;  . Colonoscopy with esophagogastroduodenoscopy (egd) Left 11/30/2012    Procedure: COLONOSCOPY WITH ESOPHAGOGASTRODUODENOSCOPY (EGD);  Surgeon: Theda Belfast, MD;  Location: [facility] ENDOSCOPY;  Service: Endoscopy;  Laterality: Left;  . Spine surgery  2004    back fusion/ [facility] Penumalli,MD  . Retinal laser procedure Right 04/2015     Social History Social History  Substance Use Topics  . Smoking status: Never Smoker   . Smokeless tobacco: Never Used  . Alcohol Use: No    Family History Family History  Problem Relation Age of Onset  .  Sudden death Mother   . Sudden death Father     Allergies  Allergies  Allergen Reactions  . Aspirin Other (See Comments)    Bleeding   . Lactose Intolerance (Gi) Diarrhea     Current Outpatient Prescriptions  Medication Sig Dispense Refill  . allopurinol (ZYLOPRIM) 100 MG tablet Take 100 mg by mouth 2 (two) times a week.    Marland Kitchen amLODipine (NORVASC) 10 MG tablet Take 10 mg by mouth Daily.     Marland Kitchen BESIVANCE 0.6 % SUSP Place 1 drop into the right eye 4 (four) times daily as needed. For 2 days after each monthly injection  1  . calcium acetate (PHOSLO) 667 MG capsule Take 3 capsules by mouth three times a day 180 capsule 3  . CALCIUM CARBONATE PO Take 650 mg by mouth 3 (three) times daily.    . cyclobenzaprine (FLEXERIL) 5 MG tablet Take one tablet by mouth once daily as needed for spasms 30 tablet 0  . darbepoetin (ARANESP) 60 MCG/0.3ML SOLN injection Inject 0.3 mLs (60 mcg total) into the vein every Thursday with hemodialysis. (Patient taking differently: Inject 60 mcg into the vein every 7 (seven) days. ) 4.2 mL   . Diphenhydramine-APAP 25-500 MG TABS Take by mouth at bedtime as needed.    . divalproex (DEPAKOTE) 250 MG DR tablet Take 1 tablet (250 mg total) by mouth every 12 (twelve) hours. 60 tablet 0  . ferrous sulfate 325 (65 FE) MG tablet Take 325 mg by  mouth 2 (two) times daily with a meal.    . folic acid (FOLVITE) 1 MG tablet Take 1 tablet (1 mg total) by mouth daily. 30 tablet 0  . gabapentin (NEURONTIN) 100 MG capsule Take 1 capsule (100 mg total) by mouth daily. 30 capsule 0  . glucose blood (ACCU-CHEK AVIVA PLUS) test strip Check blood sugar three times daily 100 each 3  . HUMALOG KWIKPEN 100 UNIT/ML KiwkPen Sliding Scale  0  . multivitamin (RENA-VIT) TABS tablet Take 1 tablet by mouth at bedtime. 30 tablet 0  . pantoprazole (PROTONIX) 20 MG tablet Take 20 mg by mouth 2 (two) times daily.    Marland Kitchen senna-docusate (SENOKOT-S) 8.6-50 MG per tablet Take 1 tablet by mouth. As needed     . sodium bicarbonate 650 MG tablet Take 650 mg by mouth 2 (two) times daily.    . sodium chloride 0.9 % SOLN 100 mL with ferric gluconate 12.5 MG/ML SOLN 62.5 mg Inject 62.5 mg into the vein every Thursday with hemodialysis.    Marland Kitchen calcitRIOL (ROCALTROL) 0.5 MCG capsule Take 2 capsules (1 mcg total) by mouth every Monday, Wednesday, and Friday with hemodialysis.    Marland Kitchen cinacalcet (SENSIPAR) 30 MG tablet Take 2 tablets (60 mg total) by mouth daily with supper. 30 tablet 0  . colchicine 0.6 MG tablet Take 0.6 mg by mouth as needed.     . fexofenadine (ALLEGRA) 180 MG tablet Take 180 mg by mouth daily. As needed    . lacosamide (VIMPAT) 50 MG TABS tablet Take 1 tablet (50 mg total) by mouth every Monday, Wednesday, and Friday with hemodialysis. 15 tablet 1  . Lacosamide 100 MG TABS Take 1 tablet (100 mg total) by mouth 2 (two) times daily. 60 tablet 3  . triamcinolone (NASACORT ALLERGY 24HR) 55 MCG/ACT AERO nasal inhaler Place 1 spray into the nose. One spray nasal as needed for allery     No current facility-administered medications for this visit.    ROS:   General:  No weight loss, Fever, chills  HEENT: No recent headaches, no nasal bleeding, no visual changes, no sore throat  Neurologic: No dizziness, blackouts, +seizures. No recent symptoms of stroke or mini- stroke. No recent episodes of slurred speech, or temporary blindness.  Cardiac: No recent episodes of chest pain/pressure, no shortness of breath at rest.  + shortness of breath with exertion.  Denies history of atrial fibrillation or irregular heartbeat  Vascular: No history of rest pain in feet.  No history of claudication.  No history of non-healing ulcer, No history of DVT   Pulmonary: No home oxygen, no productive cough, no hemoptysis,  No asthma or wheezing  Musculoskeletal:  [ ]  Arthritis, [ ]  Low back pain,  [ ]  Joint pain  Hematologic:No history of hypercoagulable state.  No history of easy bleeding.  No history of  anemia  Gastrointestinal: No hematochezia or melena,  No gastroesophageal reflux, no trouble swallowing  Urinary: [ ]  chronic Kidney disease, [ ]  on HD - [ ]  MWF or [ ]  TTHS, [ ]  Burning with urination, [ ]  Frequent urination, [ ]  Difficulty urinating;   Skin: No rashes  Psychological: No history of anxiety,  No history of depression   Physical Examination  Filed Vitals:   08/13/15 1337 08/13/15 1340  BP: 174/81 173/72  Pulse: 81 81  Temp: 97.5 F (36.4 C)   Resp: 16   Height: 6' (1.829 m)   Weight: 208 lb (94.348 kg)   SpO2: 99%  Body mass index is 28.2 kg/(m^2).  General:  Alert and oriented, no acute distress HEENT: Normal Neck: No bruit or JVD Pulmonary: Clear to auscultation bilaterally, right side dialysis catheter Cardiac: Regular Rate and Rhythm  Gastrointestinal: Soft, non-tender, non-distended, no mass Skin: No rash Extremity Pulses:  2+ radial, brachial pulses bilaterally, pulsatile right upper arm AV fistula Musculoskeletal: No deformity or edema  Neurologic: Upper and lower extremity motor 5/5 and symmetric  DATA:   Vein mapping ultrasound shows no suitable cephalic vein left or right arm vein is marginal in the basilic left and right arm as well. Patient has essentially normal brachial bifurcation but does have calcified vessels   ASSESSMENT: Patient needs long-term hemodialysis access   PLAN:  Left arm AV graft scheduled for 08/26/2015. Risks benefits possible complications and procedure details were explained to the patient today including not limited to bleeding infection steal graft thrombosis. Patient understands and agrees to proceed.  Fabienne Bruns, MD [facility] [place] Office: [phone] Pager: [phone]

## 2015-09-09 NOTE — Anesthesia Procedure Notes (Signed)
Procedure Name: LMA Insertion Date/Time: 09/09/2015 7:51 AM Performed by: Wray Kearns A Pre-anesthesia Checklist: Patient identified, Timeout performed, Emergency Drugs available, Suction available and Patient being monitored Patient Re-evaluated:Patient Re-evaluated prior to inductionOxygen Delivery Method: Circle system utilized Preoxygenation: Pre-oxygenation with 100% oxygen Intubation Type: IV induction Ventilation: Mask ventilation without difficulty LMA: LMA inserted LMA Size: 4.0 Tube type: Oral Placement Confirmation: positive ETCO2 and breath sounds checked- equal and bilateral Tube secured with: Tape Dental Injury: Teeth and Oropharynx as per pre-operative assessment

## 2015-09-09 NOTE — Transfer of Care (Signed)
Immediate Anesthesia Transfer of Care Note  Patient: [name]  Procedure(s) Performed: Procedure(s): INSERTION OF ARTERIOVENOUS  GORE-TEX GRAFT Left  ARM (Left)  Patient Location: PACU  Anesthesia Type:General  Level of Consciousness: awake, oriented, sedated, patient cooperative and responds to stimulation  Airway & Oxygen Therapy: Patient Spontanous Breathing and Patient connected to nasal cannula oxygen  Post-op Assessment: Report given to RN, Post -op Vital signs reviewed and stable, Patient moving all extremities and Patient moving all extremities X 4  Post vital signs: Reviewed and stable  Last Vitals:  Filed Vitals:   09/09/15 0614  BP: 153/71  Pulse: 78  Temp: 36.6 C  Resp: 16    Complications: No apparent anesthesia complications

## 2015-09-09 NOTE — Anesthesia Preprocedure Evaluation (Addendum)
Anesthesia Evaluation  Patient identified by MRN, date of birth, ID band Patient awake    Reviewed: Allergy & Precautions, NPO status , Patient's Chart, lab work & pertinent test results  Airway Mallampati: II  TM Distance: >3 FB Neck ROM: Full    Dental  (+) Poor Dentition   Pulmonary    breath sounds clear to auscultation       Cardiovascular hypertension,  Rhythm:Regular     Neuro/Psych    GI/Hepatic   Endo/Other  diabetes  Renal/GU      Musculoskeletal   Abdominal   Peds  Hematology   Anesthesia Other Findings   Reproductive/Obstetrics                            Anesthesia Physical Anesthesia Plan  ASA: III  Anesthesia Plan: General   Post-op Pain Management:    Induction: Intravenous  Airway Management Planned: LMA  Additional Equipment:   Intra-op Plan:   Post-operative Plan:   Informed Consent: I have reviewed the patients History and Physical, chart, labs and discussed the procedure including the risks, benefits and alternatives for the proposed anesthesia with the patient or authorized representative who has indicated his/her understanding and acceptance.     Plan Discussed with: CRNA and Anesthesiologist  Anesthesia Plan Comments:         Anesthesia Quick Evaluation

## 2015-09-10 ENCOUNTER — Other Ambulatory Visit: Payer: Self-pay

## 2015-09-10 ENCOUNTER — Encounter (HOSPITAL_COMMUNITY): Payer: Self-pay | Admitting: *Deleted

## 2015-09-10 ENCOUNTER — Emergency Department (HOSPITAL_COMMUNITY)
Admission: EM | Admit: 2015-09-10 | Discharge: 2015-09-10 | Disposition: A | Discharge status: Home / Self Care | Payer: Medicare Other | Attending: Emergency Medicine | Admitting: Emergency Medicine

## 2015-09-10 DIAGNOSIS — Z992 Dependence on renal dialysis: Secondary | ICD-10-CM | POA: Insufficient documentation

## 2015-09-10 DIAGNOSIS — M62838 Other muscle spasm: Secondary | ICD-10-CM | POA: Diagnosis present

## 2015-09-10 DIAGNOSIS — Z9981 Dependence on supplemental oxygen: Secondary | ICD-10-CM | POA: Insufficient documentation

## 2015-09-10 DIAGNOSIS — N186 End stage renal disease: Secondary | ICD-10-CM | POA: Diagnosis not present

## 2015-09-10 DIAGNOSIS — Z8719 Personal history of other diseases of the digestive system: Secondary | ICD-10-CM | POA: Insufficient documentation

## 2015-09-10 DIAGNOSIS — G40909 Epilepsy, unspecified, not intractable, without status epilepticus: Secondary | ICD-10-CM | POA: Insufficient documentation

## 2015-09-10 DIAGNOSIS — M1990 Unspecified osteoarthritis, unspecified site: Secondary | ICD-10-CM | POA: Diagnosis not present

## 2015-09-10 DIAGNOSIS — Z79899 Other long term (current) drug therapy: Secondary | ICD-10-CM | POA: Insufficient documentation

## 2015-09-10 DIAGNOSIS — E875 Hyperkalemia: Secondary | ICD-10-CM | POA: Insufficient documentation

## 2015-09-10 DIAGNOSIS — G4730 Sleep apnea, unspecified: Secondary | ICD-10-CM | POA: Diagnosis not present

## 2015-09-10 DIAGNOSIS — R251 Tremor, unspecified: Secondary | ICD-10-CM | POA: Diagnosis not present

## 2015-09-10 DIAGNOSIS — R404 Transient alteration of awareness: Secondary | ICD-10-CM | POA: Diagnosis not present

## 2015-09-10 DIAGNOSIS — R531 Weakness: Secondary | ICD-10-CM | POA: Insufficient documentation

## 2015-09-10 DIAGNOSIS — I120 Hypertensive chronic kidney disease with stage 5 chronic kidney disease or end stage renal disease: Secondary | ICD-10-CM | POA: Insufficient documentation

## 2015-09-10 DIAGNOSIS — E119 Type 2 diabetes mellitus without complications: Secondary | ICD-10-CM | POA: Diagnosis not present

## 2015-09-10 LAB — COMPREHENSIVE METABOLIC PANEL
ALBUMIN: 3.5 g/dL (ref 3.5–5.0)
ALK PHOS: 41 U/L (ref 38–126)
ALT: 11 U/L — ABNORMAL LOW (ref 17–63)
ANION GAP: 14 (ref 5–15)
AST: 15 U/L (ref 15–41)
BILIRUBIN TOTAL: 0.7 mg/dL (ref 0.3–1.2)
BUN: 87 mg/dL — ABNORMAL HIGH (ref 6–20)
CALCIUM: 8.6 mg/dL — AB (ref 8.9–10.3)
CO2: 25 mmol/L (ref 22–32)
Chloride: 101 mmol/L (ref 101–111)
Creatinine, Ser: 9.4 mg/dL — ABNORMAL HIGH (ref 0.61–1.24)
GFR calc non Af Amer: 5 mL/min — ABNORMAL LOW (ref >60)
GFR, EST AFRICAN AMERICAN: 5 mL/min — AB (ref >60)
GLUCOSE: 113 mg/dL — AB (ref 65–99)
POTASSIUM: 5.8 mmol/L — AB (ref 3.5–5.1)
SODIUM: 140 mmol/L (ref 135–145)
TOTAL PROTEIN: 6.4 g/dL — AB (ref 6.5–8.1)

## 2015-09-10 LAB — CBC WITH DIFFERENTIAL/PLATELET
BASOS PCT: 1 %
Basophils Absolute: 0 10*3/uL (ref 0.0–0.1)
EOS ABS: 0.2 10*3/uL (ref 0.0–0.7)
EOS PCT: 4 %
HCT: 30.4 % — ABNORMAL LOW (ref 39.0–52.0)
Hemoglobin: 9.9 g/dL — ABNORMAL LOW (ref 13.0–17.0)
LYMPHS ABS: 1.2 10*3/uL (ref 0.7–4.0)
Lymphocytes Relative: 28 %
MCH: 31.7 pg (ref 26.0–34.0)
MCHC: 32.6 g/dL (ref 30.0–36.0)
MCV: 97.4 fL (ref 78.0–100.0)
MONO ABS: 0.3 10*3/uL (ref 0.1–1.0)
MONOS PCT: 6 %
NEUTROS PCT: 62 %
Neutro Abs: 2.7 10*3/uL (ref 1.7–7.7)
Platelets: 51 10*3/uL — ABNORMAL LOW (ref 150–400)
RBC: 3.12 MIL/uL — ABNORMAL LOW (ref 4.22–5.81)
RDW: 15.3 % (ref 11.5–15.5)
WBC: 4.4 10*3/uL (ref 4.0–10.5)

## 2015-09-10 LAB — URINE MICROSCOPIC-ADD ON
BACTERIA UA: NONE SEEN
RBC / HPF: NONE SEEN RBC/hpf (ref 0–5)
Squamous Epithelial / LPF: NONE SEEN

## 2015-09-10 LAB — URINALYSIS, ROUTINE W REFLEX MICROSCOPIC
BILIRUBIN URINE: NEGATIVE
Glucose, UA: 100 mg/dL — AB
HGB URINE DIPSTICK: NEGATIVE
KETONES UR: NEGATIVE mg/dL
Leukocytes, UA: NEGATIVE
NITRITE: NEGATIVE
Protein, ur: 100 mg/dL — AB
SPECIFIC GRAVITY, URINE: 1.011 (ref 1.005–1.030)
pH: 7.5 (ref 5.0–8.0)

## 2015-09-10 MED ORDER — DIVALPROEX SODIUM 250 MG PO DR TAB: 500.0000 mg | DELAYED_RELEASE_TABLET | Freq: Once | ORAL | Status: DC

## 2015-09-10 NOTE — ED Notes (Signed)
Per EMS- Pt is coming from home today and reports muscle spasms. EMS reports that pt has periods where he loses strength in arms and legs. Pt does not lose consciousness during the episodes. Pt also has generalized weakness. Pt had orthostatic changes with EMS.

## 2015-09-10 NOTE — ED Notes (Signed)
MD at bedside. 

## 2015-09-10 NOTE — ED Provider Notes (Signed)
CSN: [id number]     Arrival date & time 09/10/15  1035 History   First MD Initiated Contact with Patient 09/10/15 1044     Chief Complaint  Patient presents with  . Spasms     (Consider location/radiation/quality/duration/timing/severity/associated sxs/prior Treatment) Patient is a 82 y.o. male presenting with neurologic complaint. The history is provided by the patient.  Neurologic Problem This is a new problem. Episode onset: this morning after waking. The problem occurs constantly. The problem has not changed since onset.Pertinent negatives include no headaches. Associated symptoms comments: Generalized weakness especially in legs, tremor. The symptoms are aggravated by exertion. Nothing relieves the symptoms. He has tried nothing for the symptoms. The treatment provided no relief.    Past Medical History  Diagnosis Date  . Hypertension   . Diabetes mellitus without complication (HCC)   . Arthritis   . History of blood transfusion   . GI bleed   . ESRD (end stage renal disease) (HCC)   . Seizure (HCC)   . Dialysis patient (HCC)   . Subdural hematoma (HCC) 05/2015    from fall  . Sleep apnea     wears CPAP n/c  . Pulmonary hypertension (HCC)     severe pulmonary HTN by 01/14/13 echo   Past Surgical History  Procedure Laterality Date  . Esophagogastroduodenoscopy  10/03/2012    Procedure: ESOPHAGOGASTRODUODENOSCOPY (EGD);  Surgeon: Theda Belfast, MD;  Location: [facility] ENDOSCOPY;  Service: Endoscopy;  Laterality: N/A;  . Colonoscopy  10/04/2012    Procedure: COLONOSCOPY;  Surgeon: Theda Belfast, MD;  Location: [facility] ENDOSCOPY;  Service: Endoscopy;  Laterality: N/A;  . Colonoscopy with esophagogastroduodenoscopy (egd) Left 11/30/2012    Procedure: COLONOSCOPY WITH ESOPHAGOGASTRODUODENOSCOPY (EGD);  Surgeon: Theda Belfast, MD;  Location: [facility] ENDOSCOPY;  Service: Endoscopy;  Laterality: Left;  . Spine surgery  2004    back fusion/ [facility] Penumalli,MD  . Retinal laser procedure Right 04/2015    Family History  Problem Relation Age of Onset  . Sudden death Mother   . Sudden death Father    Social History  Substance Use Topics  . Smoking status: Never Smoker   . Smokeless tobacco: Never Used  . Alcohol Use: No    Review of Systems  Neurological: Positive for tremors (resolved) and weakness. Negative for speech difficulty and headaches.  All other systems reviewed and are negative.     Allergies  Aspirin and Lactose intolerance (gi)  Home Medications   Prior to Admission medications   Medication Sig Start Date End Date Taking? Authorizing Provider  amLODipine (NORVASC) 10 MG tablet Take 10 mg by mouth Daily.  08/15/12   Historical Provider, MD  calcitRIOL (ROCALTROL) 0.5 MCG capsule Take 2 capsules (1 mcg total) by mouth every Monday, Wednesday, and Friday with hemodialysis. Patient not taking: Reported on 09/05/2015 05/23/15   Lonia Blood, MD  calcium acetate (PHOSLO) 667 MG capsule Take 3 capsules by mouth three times a day Patient taking differently: Take 2,001 mg by mouth. Take 3 capsules by mouth three times a day 07/15/15   Sharon Seller, NP  cinacalcet (SENSIPAR) 30 MG tablet Take 2 tablets (60 mg total) by mouth daily with supper. 06/22/15   Catarina Hartshorn, MD  colchicine 0.6 MG tablet Take 0.6 mg by mouth as needed. For gout 02/14/14   Historical Provider, MD  cyclobenzaprine (FLEXERIL) 5 MG tablet Take one tablet by mouth once daily as needed for spasms Patient not taking: Reported on 09/05/2015 08/04/15  [name] L [name], DO  darbepoetin (ARANESP) 60 MCG/0.3ML SOLN injection Inject 0.3 mLs (60 mcg total) into the vein every Thursday with hemodialysis. Patient taking differently: Inject 60 mcg into the vein every 7 (seven) days.  09/29/13   Joseph Art, DO  Diphenhydramine-APAP 25-500 MG TABS Take 1 tablet by mouth at bedtime as needed. For sleep    Historical Provider, MD  divalproex (DEPAKOTE) 500 MG DR tablet Take 1 tablet (500 mg total) by mouth 2  (two) times daily. 09/05/15   Laurence Spates, MD  ferrous sulfate 325 (65 FE) MG tablet Take 325 mg by mouth 2 (two) times daily with a meal.    Historical Provider, MD  fexofenadine (ALLEGRA) 180 MG tablet Take 180 mg by mouth daily as needed. Reported on 09/05/2015    Historical Provider, MD  folic acid (FOLVITE) 1 MG tablet Take 1 tablet (1 mg total) by mouth daily. 09/29/13   Joseph Art, DO  gabapentin (NEURONTIN) 100 MG capsule Take 1 capsule (100 mg total) by mouth daily. 05/23/15   Lonia Blood, MD  HUMALOG KWIKPEN 100 UNIT/ML KiwkPen Inject 0-10 Units into the skin 3 (three) times daily. Sliding Scale 04/16/15   Historical Provider, MD  lacosamide (VIMPAT) 50 MG TABS tablet Take 1 tablet (50 mg total) by mouth every Monday, Wednesday, and Friday with hemodialysis. 07/28/15   Joseph Art, DO  Lacosamide 100 MG TABS Take 1 tablet (100 mg total) by mouth 2 (two) times daily. Patient taking differently: Take 100 mg by mouth as directed. Take 1 tablet twice a day on NON dialysis days 07/21/15   Kermit Balo, DO  multivitamin (RENA-VIT) TABS tablet Take 1 tablet by mouth at bedtime. 09/29/13   Joseph Art, DO  senna-docusate (SENOKOT-S) 8.6-50 MG per tablet Take 1 tablet by mouth. As needed    Historical Provider, MD  sodium chloride 0.9 % SOLN 100 mL with ferric gluconate 12.5 MG/ML SOLN 62.5 mg Inject 62.5 mg into the vein every Thursday with hemodialysis. 09/29/13   Joseph Art, DO  traMADol (ULTRAM) 50 MG tablet Take 1 tablet (50 mg total) by mouth every 6 (six) hours as needed. 09/09/15   Samantha J Rhyne, PA-C  triamcinolone (NASACORT ALLERGY 24HR) 55 MCG/ACT AERO nasal inhaler Place 1 spray into the nose. One spray nasal as needed for allery    Historical Provider, MD   BP 160/72 mmHg  Pulse 81  Temp(Src) 97.8 F (36.6 C) (Oral)  Resp 16  SpO2 100% Physical Exam  Constitutional: He is oriented to person, place, and time. He appears well-developed and well-nourished.  No distress.  HENT:  Head: Normocephalic and atraumatic.  Eyes: Conjunctivae are normal.  Neck: Neck supple. No tracheal deviation present.  Cardiovascular: Normal rate and regular rhythm.   Pulmonary/Chest: Effort normal. No respiratory distress.  Abdominal: Soft. He exhibits no distension.  Neurological: He is alert and oriented to person, place, and time. He has normal strength. He displays tremor (mild essential). No cranial nerve deficit. He displays no seizure activity. GCS eye subscore is 4. GCS verbal subscore is 5. GCS motor subscore is 6.  Skin: Skin is warm and dry.  Psychiatric: He has a normal mood and affect.  Vitals reviewed.   ED Course  Procedures (including critical care time) Labs Review Labs Reviewed  CBC WITH DIFFERENTIAL/PLATELET - Abnormal; Notable for the following:    RBC 3.12 (*)    Hemoglobin 9.9 (*)    HCT  30.4 (*)    Platelets 51 (*)    All other components within normal limits  URINALYSIS, ROUTINE W REFLEX MICROSCOPIC (NOT AT [facility]) - Abnormal; Notable for the following:    Glucose, UA 100 (*)    Protein, ur 100 (*)    All other components within normal limits  COMPREHENSIVE METABOLIC PANEL - Abnormal; Notable for the following:    Potassium 5.8 (*)    Glucose, Bld 113 (*)    BUN 87 (*)    Creatinine, Ser 9.40 (*)    Calcium 8.6 (*)    Total Protein 6.4 (*)    ALT 11 (*)    GFR calc non Af Amer 5 (*)    GFR calc Af Amer 5 (*)    All other components within normal limits  URINE MICROSCOPIC-ADD ON    Imaging Review No results found. I have personally reviewed and evaluated these images and lab results as part of my medical decision-making.   EKG Interpretation   Date/Time:  Wednesday September 10 2015 11:09:25 EST Ventricular Rate:  80 PR Interval:  245 QRS Duration: 87 QT Interval:  379 QTC Calculation: 437 R Axis:   36 Text Interpretation:  Sinus rhythm Prolonged PR interval Nonspecific T  abnormalities, lateral leads Baseline wander  in lead(s) I II aVR No  significant change since last tracing Confirmed by Dayveon Halley MD, Richey Doolittle  ([id number]) on 09/10/2015 6:41:35 PM      MDM   Final diagnoses:  Hyperkalemia    Pt with global weakness especially in legs, feels like he is shaky, no mental status changes. Normally can urinate 3-4x/day, no UOP last 24 hours until just now. Last dialysis 2 days ago, missed this morning. MWF schedule.  Patient had a graft placed in his left arm yesterday and underwent a procedure with LMA. Today he has felt weak. No seizure activity, does not feel like he has had a seizure. Screening labs were performed and demonstrates a mild hyperkalemia 5.8. Patient has no EKG changes. No indication for emergent temporization. I attempted to call the [facility] where the patient receives dialysis. Nursing there told me they were unable to take him today. I asked for a time when the patient would be able to get dialysis potentially early in the morning and I was placed on hold and told that Dr. Allena Katz had been contacted for dialysis here prior to discharge. I discussed this with the nephrology team who agreed to take the patient for dialysis to substitute his normal treatment that was due today.  No significant hematologic or metabolic abnormalities to explain symptoms. Patient likely is experiencing mild weakness secondary to the procedure performed yesterday. This is not typical of any of his neurologic symptoms that he has had previously. He has also been noncompliant with his prescribed Depakote because of a miscommunication with his family members so I recommended continuing this agent to prevent breakthrough seizures during dialysis treatments.  Lyndal Pulley, MD 09/10/15 [phone]

## 2015-09-11 ENCOUNTER — Encounter (INDEPENDENT_AMBULATORY_CARE_PROVIDER_SITE_OTHER): Payer: Medicare Other | Admitting: Ophthalmology

## 2015-09-11 ENCOUNTER — Telehealth: Payer: Self-pay | Admitting: *Deleted

## 2015-09-11 DIAGNOSIS — G40909 Epilepsy, unspecified, not intractable, without status epilepticus: Secondary | ICD-10-CM | POA: Diagnosis not present

## 2015-09-11 DIAGNOSIS — S065X0S Traumatic subdural hemorrhage without loss of consciousness, sequela: Secondary | ICD-10-CM | POA: Diagnosis not present

## 2015-09-11 DIAGNOSIS — I120 Hypertensive chronic kidney disease with stage 5 chronic kidney disease or end stage renal disease: Secondary | ICD-10-CM | POA: Diagnosis not present

## 2015-09-11 DIAGNOSIS — E1122 Type 2 diabetes mellitus with diabetic chronic kidney disease: Secondary | ICD-10-CM | POA: Diagnosis not present

## 2015-09-11 NOTE — Telephone Encounter (Signed)
[name] with [name] called and stated that patient is in a lot of  Back pain and it is 8/10. Patient went to ER but they sent him home with nothing. Patient has an appointment with you on 09/18/15 but needs something for the pain now. Please Advise.

## 2015-09-11 NOTE — Telephone Encounter (Signed)
Pt was given a prescription for tramadol 20 tablets on 09/09/15 according to the epic records.  If this was not actually received at the pharmacy, we can provide that same Rx for him.

## 2015-09-12 DIAGNOSIS — E1122 Type 2 diabetes mellitus with diabetic chronic kidney disease: Secondary | ICD-10-CM | POA: Diagnosis not present

## 2015-09-12 DIAGNOSIS — G40909 Epilepsy, unspecified, not intractable, without status epilepticus: Secondary | ICD-10-CM | POA: Diagnosis not present

## 2015-09-12 DIAGNOSIS — I120 Hypertensive chronic kidney disease with stage 5 chronic kidney disease or end stage renal disease: Secondary | ICD-10-CM | POA: Diagnosis not present

## 2015-09-12 DIAGNOSIS — S065X0S Traumatic subdural hemorrhage without loss of consciousness, sequela: Secondary | ICD-10-CM | POA: Diagnosis not present

## 2015-09-12 MED ORDER — TRAMADOL HCL 50 MG PO TABS: 50.0000 mg | ORAL_TABLET | Freq: Four times a day (QID) | ORAL | Status: DC | PRN

## 2015-09-12 NOTE — Telephone Encounter (Signed)
Medication was not called to the pharmacy nor filled, talked with Pharmacist, Kathlene November at [facility]. Called in Rx and notified Okey Regal with [name] and she will call patient.

## 2015-09-15 ENCOUNTER — Other Ambulatory Visit: Payer: Self-pay

## 2015-09-15 ENCOUNTER — Encounter (HOSPITAL_COMMUNITY): Payer: Self-pay | Admitting: *Deleted

## 2015-09-15 ENCOUNTER — Inpatient Hospital Stay (HOSPITAL_COMMUNITY)
Admission: EM | Admit: 2015-09-15 | Discharge: 2015-09-18 | DRG: 682 | Disposition: A | Discharge status: Rehabilitation Facility | Payer: Medicare Other | Attending: Internal Medicine | Admitting: Internal Medicine

## 2015-09-15 ENCOUNTER — Telehealth: Payer: Self-pay | Admitting: *Deleted

## 2015-09-15 ENCOUNTER — Emergency Department (HOSPITAL_COMMUNITY): Payer: Medicare Other

## 2015-09-15 ENCOUNTER — Other Ambulatory Visit (HOSPITAL_COMMUNITY): Payer: Self-pay

## 2015-09-15 DIAGNOSIS — N19 Unspecified kidney failure: Secondary | ICD-10-CM | POA: Diagnosis not present

## 2015-09-15 DIAGNOSIS — I272 Other secondary pulmonary hypertension: Secondary | ICD-10-CM | POA: Diagnosis not present

## 2015-09-15 DIAGNOSIS — Z794 Long term (current) use of insulin: Secondary | ICD-10-CM

## 2015-09-15 DIAGNOSIS — G4730 Sleep apnea, unspecified: Secondary | ICD-10-CM | POA: Diagnosis not present

## 2015-09-15 DIAGNOSIS — M549 Dorsalgia, unspecified: Secondary | ICD-10-CM

## 2015-09-15 DIAGNOSIS — E11319 Type 2 diabetes mellitus with unspecified diabetic retinopathy without macular edema: Secondary | ICD-10-CM | POA: Diagnosis not present

## 2015-09-15 DIAGNOSIS — Z66 Do not resuscitate: Secondary | ICD-10-CM | POA: Diagnosis not present

## 2015-09-15 DIAGNOSIS — E872 Acidosis: Secondary | ICD-10-CM | POA: Diagnosis not present

## 2015-09-15 DIAGNOSIS — Z79899 Other long term (current) drug therapy: Secondary | ICD-10-CM | POA: Diagnosis not present

## 2015-09-15 DIAGNOSIS — G9340 Encephalopathy, unspecified: Secondary | ICD-10-CM | POA: Diagnosis present

## 2015-09-15 DIAGNOSIS — R339 Retention of urine, unspecified: Secondary | ICD-10-CM | POA: Diagnosis present

## 2015-09-15 DIAGNOSIS — Z91018 Allergy to other foods: Secondary | ICD-10-CM

## 2015-09-15 DIAGNOSIS — E875 Hyperkalemia: Secondary | ICD-10-CM | POA: Diagnosis not present

## 2015-09-15 DIAGNOSIS — E1122 Type 2 diabetes mellitus with diabetic chronic kidney disease: Secondary | ICD-10-CM | POA: Diagnosis present

## 2015-09-15 DIAGNOSIS — Z886 Allergy status to analgesic agent status: Secondary | ICD-10-CM

## 2015-09-15 DIAGNOSIS — N189 Chronic kidney disease, unspecified: Secondary | ICD-10-CM

## 2015-09-15 DIAGNOSIS — I2510 Atherosclerotic heart disease of native coronary artery without angina pectoris: Secondary | ICD-10-CM | POA: Diagnosis not present

## 2015-09-15 DIAGNOSIS — D631 Anemia in chronic kidney disease: Secondary | ICD-10-CM | POA: Diagnosis present

## 2015-09-15 DIAGNOSIS — G9349 Other encephalopathy: Secondary | ICD-10-CM | POA: Diagnosis present

## 2015-09-15 DIAGNOSIS — R739 Hyperglycemia, unspecified: Secondary | ICD-10-CM | POA: Diagnosis not present

## 2015-09-15 DIAGNOSIS — N186 End stage renal disease: Secondary | ICD-10-CM | POA: Diagnosis not present

## 2015-09-15 DIAGNOSIS — K5521 Angiodysplasia of colon with hemorrhage: Secondary | ICD-10-CM

## 2015-09-15 DIAGNOSIS — I120 Hypertensive chronic kidney disease with stage 5 chronic kidney disease or end stage renal disease: Principal | ICD-10-CM | POA: Diagnosis present

## 2015-09-15 DIAGNOSIS — Z9115 Patient's noncompliance with renal dialysis: Secondary | ICD-10-CM

## 2015-09-15 DIAGNOSIS — Z515 Encounter for palliative care: Secondary | ICD-10-CM | POA: Diagnosis not present

## 2015-09-15 DIAGNOSIS — Z992 Dependence on renal dialysis: Secondary | ICD-10-CM

## 2015-09-15 DIAGNOSIS — G40909 Epilepsy, unspecified, not intractable, without status epilepticus: Secondary | ICD-10-CM | POA: Diagnosis present

## 2015-09-15 DIAGNOSIS — R001 Bradycardia, unspecified: Secondary | ICD-10-CM | POA: Diagnosis present

## 2015-09-15 DIAGNOSIS — R7309 Other abnormal glucose: Secondary | ICD-10-CM | POA: Diagnosis not present

## 2015-09-15 DIAGNOSIS — G8929 Other chronic pain: Secondary | ICD-10-CM | POA: Insufficient documentation

## 2015-09-15 DIAGNOSIS — F0390 Unspecified dementia without behavioral disturbance: Secondary | ICD-10-CM | POA: Diagnosis present

## 2015-09-15 DIAGNOSIS — R410 Disorientation, unspecified: Secondary | ICD-10-CM | POA: Diagnosis not present

## 2015-09-15 LAB — I-STAT CHEM 8, ED
BUN: 131 mg/dL — ABNORMAL HIGH (ref 6–20)
CHLORIDE: 98 mmol/L — AB (ref 101–111)
Calcium, Ion: 0.94 mmol/L — ABNORMAL LOW (ref 1.13–1.30)
Creatinine, Ser: 12.7 mg/dL — ABNORMAL HIGH (ref 0.61–1.24)
Glucose, Bld: 195 mg/dL — ABNORMAL HIGH (ref 65–99)
HEMATOCRIT: 27 % — AB (ref 39.0–52.0)
Hemoglobin: 9.2 g/dL — ABNORMAL LOW (ref 13.0–17.0)
POTASSIUM: 6.3 mmol/L — AB (ref 3.5–5.1)
SODIUM: 131 mmol/L — AB (ref 135–145)
TCO2: 19 mmol/L (ref 0–100)

## 2015-09-15 LAB — CBC WITH DIFFERENTIAL/PLATELET
BASOS ABS: 0 10*3/uL (ref 0.0–0.1)
Basophils Relative: 0 %
Eosinophils Absolute: 0.2 10*3/uL (ref 0.0–0.7)
Eosinophils Relative: 3 %
HEMATOCRIT: 26.1 % — AB (ref 39.0–52.0)
Hemoglobin: 8.5 g/dL — ABNORMAL LOW (ref 13.0–17.0)
LYMPHS ABS: 1.5 10*3/uL (ref 0.7–4.0)
LYMPHS PCT: 26 %
MCH: 31.4 pg (ref 26.0–34.0)
MCHC: 32.6 g/dL (ref 30.0–36.0)
MCV: 96.3 fL (ref 78.0–100.0)
MONO ABS: 0.2 10*3/uL (ref 0.1–1.0)
Monocytes Relative: 4 %
NEUTROS ABS: 3.8 10*3/uL (ref 1.7–7.7)
Neutrophils Relative %: 67 %
Platelets: 95 10*3/uL — ABNORMAL LOW (ref 150–400)
RBC: 2.71 MIL/uL — AB (ref 4.22–5.81)
RDW: 15.7 % — ABNORMAL HIGH (ref 11.5–15.5)
WBC: 5.7 10*3/uL (ref 4.0–10.5)

## 2015-09-15 LAB — GLUCOSE, CAPILLARY: GLUCOSE-CAPILLARY: 95 mg/dL (ref 65–99)

## 2015-09-15 LAB — URINALYSIS, ROUTINE W REFLEX MICROSCOPIC
Bilirubin Urine: NEGATIVE
Glucose, UA: 250 mg/dL — AB
Hgb urine dipstick: NEGATIVE
Ketones, ur: NEGATIVE mg/dL
LEUKOCYTES UA: NEGATIVE
NITRITE: NEGATIVE
Protein, ur: 100 mg/dL — AB
SPECIFIC GRAVITY, URINE: 1.012 (ref 1.005–1.030)
pH: 6 (ref 5.0–8.0)

## 2015-09-15 LAB — PROTIME-INR
INR: 1.16 (ref 0.00–1.49)
Prothrombin Time: 15 seconds (ref 11.6–15.2)

## 2015-09-15 LAB — URINE MICROSCOPIC-ADD ON
BACTERIA UA: NONE SEEN
RBC / HPF: NONE SEEN RBC/hpf (ref 0–5)

## 2015-09-15 LAB — COMPREHENSIVE METABOLIC PANEL
ALBUMIN: 3.5 g/dL (ref 3.5–5.0)
ALT: 10 U/L — ABNORMAL LOW (ref 17–63)
ANION GAP: 18 — AB (ref 5–15)
AST: 16 U/L (ref 15–41)
Alkaline Phosphatase: 49 U/L (ref 38–126)
BILIRUBIN TOTAL: 0.6 mg/dL (ref 0.3–1.2)
BUN: 135 mg/dL — ABNORMAL HIGH (ref 6–20)
CHLORIDE: 95 mmol/L — AB (ref 101–111)
CO2: 17 mmol/L — ABNORMAL LOW (ref 22–32)
Calcium: 8 mg/dL — ABNORMAL LOW (ref 8.9–10.3)
Creatinine, Ser: 13.62 mg/dL — ABNORMAL HIGH (ref 0.61–1.24)
GFR calc Af Amer: 3 mL/min — ABNORMAL LOW (ref >60)
GFR calc non Af Amer: 3 mL/min — ABNORMAL LOW (ref >60)
GLUCOSE: 209 mg/dL — AB (ref 65–99)
Potassium: 6.4 mmol/L (ref 3.5–5.1)
SODIUM: 130 mmol/L — AB (ref 135–145)
TOTAL PROTEIN: 6.4 g/dL — AB (ref 6.5–8.1)

## 2015-09-15 MED ORDER — COLCHICINE 0.6 MG PO TABS: 0.6000 mg | ORAL_TABLET | Freq: Every day | ORAL | Status: DC | PRN

## 2015-09-15 MED ORDER — DEXTROSE 50 % IV SOLN
1.0000 | Freq: Once | INTRAVENOUS | Status: AC
Start: 2015-09-15 — End: 2015-09-15
  Administered 2015-09-15: 50 mL via INTRAVENOUS
  Filled 2015-09-15: qty 50

## 2015-09-15 MED ORDER — ACETAMINOPHEN 650 MG RE SUPP: 650.0000 mg | Freq: Four times a day (QID) | RECTAL | Status: DC | PRN

## 2015-09-15 MED ORDER — SODIUM POLYSTYRENE SULFONATE 15 GM/60ML PO SUSP
50.0000 g | Freq: Once | ORAL | Status: AC
  Administered 2015-09-15: 50 g via ORAL
  Filled 2015-09-15: qty 240

## 2015-09-15 MED ORDER — AMLODIPINE BESYLATE 5 MG PO TABS
5.0000 mg | ORAL_TABLET | Freq: Every day | ORAL | Status: DC
  Filled 2015-09-15: qty 1

## 2015-09-15 MED ORDER — SODIUM CHLORIDE 0.9 % IV SOLN
1.0000 g | Freq: Once | INTRAVENOUS | Status: AC
  Administered 2015-09-15: 1 g via INTRAVENOUS
  Filled 2015-09-15: qty 10

## 2015-09-15 MED ORDER — CINACALCET HCL 30 MG PO TABS
30.0000 mg | ORAL_TABLET | Freq: Every day | ORAL | Status: DC
  Administered 2015-09-16 – 2015-09-18 (×3): 30 mg via ORAL
  Filled 2015-09-15 (×3): qty 1

## 2015-09-15 MED ORDER — GABAPENTIN 100 MG PO CAPS
100.0000 mg | ORAL_CAPSULE | Freq: Every day | ORAL | Status: DC
  Administered 2015-09-16 – 2015-09-18 (×3): 100 mg via ORAL
  Filled 2015-09-15 (×3): qty 1

## 2015-09-15 MED ORDER — CALCITRIOL 0.5 MCG PO CAPS
0.7500 ug | ORAL_CAPSULE | ORAL | Status: DC
  Administered 2015-09-15 – 2015-09-17 (×2): 0.75 ug via ORAL
  Filled 2015-09-15 (×3): qty 1

## 2015-09-15 MED ORDER — SODIUM CHLORIDE 0.9 % IJ SOLN
3.0000 mL | Freq: Two times a day (BID) | INTRAMUSCULAR | Status: DC
  Administered 2015-09-15 – 2015-09-18 (×5): 3 mL via INTRAVENOUS

## 2015-09-15 MED ORDER — DARBEPOETIN ALFA 200 MCG/0.4ML IJ SOSY
200.0000 ug | PREFILLED_SYRINGE | INTRAMUSCULAR | Status: DC
  Administered 2015-09-17: 200 ug via INTRAVENOUS
  Filled 2015-09-15: qty 0.4

## 2015-09-15 MED ORDER — INSULIN ASPART 100 UNIT/ML IV SOLN
10.0000 [IU] | Freq: Once | INTRAVENOUS | Status: AC
  Administered 2015-09-15: 10 [IU] via INTRAVENOUS
  Filled 2015-09-15: qty 1

## 2015-09-15 MED ORDER — AMLODIPINE BESYLATE 10 MG PO TABS
10.0000 mg | ORAL_TABLET | Freq: Every day | ORAL | Status: DC
  Administered 2015-09-16 – 2015-09-18 (×3): 10 mg via ORAL
  Filled 2015-09-15 (×3): qty 1

## 2015-09-15 MED ORDER — LACOSAMIDE 50 MG PO TABS
100.0000 mg | ORAL_TABLET | ORAL | Status: DC
  Administered 2015-09-16 – 2015-09-18 (×3): 100 mg via ORAL
  Filled 2015-09-15 (×3): qty 2

## 2015-09-15 MED ORDER — ONDANSETRON HCL 4 MG PO TABS: 4.0000 mg | ORAL_TABLET | Freq: Four times a day (QID) | ORAL | Status: DC | PRN

## 2015-09-15 MED ORDER — RENA-VITE PO TABS
1.0000 | ORAL_TABLET | Freq: Every day | ORAL | Status: DC
  Administered 2015-09-15 – 2015-09-17 (×3): 1 via ORAL
  Filled 2015-09-15 (×3): qty 1

## 2015-09-15 MED ORDER — FOLIC ACID 1 MG PO TABS
1.0000 mg | ORAL_TABLET | Freq: Every day | ORAL | Status: DC
  Administered 2015-09-16 – 2015-09-18 (×3): 1 mg via ORAL
  Filled 2015-09-15 (×3): qty 1

## 2015-09-15 MED ORDER — ONDANSETRON HCL 4 MG/2ML IJ SOLN: 4.0000 mg | Freq: Four times a day (QID) | INTRAMUSCULAR | Status: DC | PRN

## 2015-09-15 MED ORDER — CALCIUM ACETATE (PHOS BINDER) 667 MG PO CAPS
2001.0000 mg | ORAL_CAPSULE | Freq: Three times a day (TID) | ORAL | Status: DC
  Administered 2015-09-16 – 2015-09-18 (×6): 2001 mg via ORAL
  Filled 2015-09-15 (×7): qty 3

## 2015-09-15 MED ORDER — LACOSAMIDE 50 MG PO TABS
50.0000 mg | ORAL_TABLET | ORAL | Status: DC
  Administered 2015-09-17: 50 mg via ORAL
  Filled 2015-09-15: qty 1

## 2015-09-15 MED ORDER — INSULIN ASPART 100 UNIT/ML ~~LOC~~ SOLN
0.0000 [IU] | Freq: Three times a day (TID) | SUBCUTANEOUS | Status: DC
  Administered 2015-09-16: 1 [IU] via SUBCUTANEOUS
  Administered 2015-09-16: 3 [IU] via SUBCUTANEOUS
  Administered 2015-09-17 – 2015-09-18 (×4): 2 [IU] via SUBCUTANEOUS
  Administered 2015-09-18: 1 [IU] via SUBCUTANEOUS

## 2015-09-15 MED ORDER — ACETAMINOPHEN 325 MG PO TABS: 650.0000 mg | ORAL_TABLET | Freq: Four times a day (QID) | ORAL | Status: DC | PRN

## 2015-09-15 NOTE — ED Provider Notes (Addendum)
CSN: [id number]     Arrival date & time 09/15/15  1610 History   First MD Initiated Contact with Patient 09/15/15 1618     Chief Complaint  Patient presents with  . Urinary Retention   HPI Pt was not feeling well today.  He was shaking which he attributes to spasms.  He states he had multiple episodes today.  He does not lose consciousness when this happens but his extremities will twitch.  Pt was supposed to go to dialysis today but he did not go because he was feeling poorly.  His family member states that he has been listless today and not acting like himself.  Last dialysis was Friday.  He still urinates a few times per day,.  He has not urinated today.  No fever, or vomiting.  NO chest pain or shortness of breath. Past Medical History  Diagnosis Date  . Hypertension   . Diabetes mellitus without complication (HCC)   . Arthritis   . History of blood transfusion   . GI bleed   . ESRD (end stage renal disease) (HCC)   . Seizure (HCC)   . Dialysis patient (HCC)   . Subdural hematoma (HCC) 05/2015    from fall  . Sleep apnea     wears CPAP n/c  . Pulmonary hypertension (HCC)     severe pulmonary HTN by 01/14/13 echo   Past Surgical History  Procedure Laterality Date  . Esophagogastroduodenoscopy  10/03/2012    Procedure: ESOPHAGOGASTRODUODENOSCOPY (EGD);  Surgeon: Theda Belfast, MD;  Location: [facility] ENDOSCOPY;  Service: Endoscopy;  Laterality: N/A;  . Colonoscopy  10/04/2012    Procedure: COLONOSCOPY;  Surgeon: Theda Belfast, MD;  Location: [facility] ENDOSCOPY;  Service: Endoscopy;  Laterality: N/A;  . Colonoscopy with esophagogastroduodenoscopy (egd) Left 11/30/2012    Procedure: COLONOSCOPY WITH ESOPHAGOGASTRODUODENOSCOPY (EGD);  Surgeon: Theda Belfast, MD;  Location: [facility] ENDOSCOPY;  Service: Endoscopy;  Laterality: Left;  . Spine surgery  2004    back fusion/ [facility] Penumalli,MD  . Retinal laser procedure Right 04/2015  . Av fistula placement Left 09/09/2015    Procedure: INSERTION OF  ARTERIOVENOUS  GORE-TEX GRAFT Left  ARM;  Surgeon: Sherren Kerns, MD;  Location: [facility] OR;  Service: Vascular;  Laterality: Left;   Family History  Problem Relation Age of Onset  . Sudden death Mother   . Sudden death Father    Social History  Substance Use Topics  . Smoking status: Never Smoker   . Smokeless tobacco: Never Used  . Alcohol Use: No    Review of Systems  All other systems reviewed and are negative.     Allergies  Aspirin and Lactose intolerance (gi)  Home Medications   Prior to Admission medications   Medication Sig Start Date End Date Taking? Authorizing Provider  amLODipine (NORVASC) 10 MG tablet Take 10 mg by mouth Daily.  08/15/12  Yes Historical Provider, MD  calcium acetate (PHOSLO) 667 MG capsule Take 3 capsules by mouth three times a day Patient taking differently: Take 2,001 mg by mouth. Take 3 capsules by mouth three times a day 07/15/15  Yes Sharon Seller, NP  cinacalcet (SENSIPAR) 30 MG tablet Take 2 tablets (60 mg total) by mouth daily with supper. 06/22/15  Yes Catarina Hartshorn, MD  colchicine 0.6 MG tablet Take 0.6 mg by mouth as needed. For gout 02/14/14  Yes Historical Provider, MD  darbepoetin (ARANESP) 60 MCG/0.3ML SOLN injection Inject 0.3 mLs (60 mcg total) into the vein every  Thursday with hemodialysis. Patient taking differently: Inject 60 mcg into the vein every 7 (seven) days.  09/29/13  Yes Joseph Art, DO  folic acid (FOLVITE) 1 MG tablet Take 1 tablet (1 mg total) by mouth daily. 09/29/13  Yes Joseph Art, DO  gabapentin (NEURONTIN) 100 MG capsule Take 1 capsule (100 mg total) by mouth daily. 05/23/15  Yes Lonia Blood, MD  HUMALOG KWIKPEN 100 UNIT/ML KiwkPen Inject 0-10 Units into the skin 3 (three) times daily. Sliding Scale 04/16/15  Yes Historical Provider, MD  lacosamide (VIMPAT) 50 MG TABS tablet Take 1 tablet (50 mg total) by mouth every Monday, Wednesday, and Friday with hemodialysis. 07/28/15  Yes Joseph Art, DO   Lacosamide 100 MG TABS Take 1 tablet (100 mg total) by mouth 2 (two) times daily. Patient taking differently: Take 100 mg by mouth as directed. Take 1 tablet twice a day on NON dialysis days 07/21/15  Yes [name] L [name], DO  sodium chloride 0.9 % SOLN 100 mL with ferric gluconate 12.5 MG/ML SOLN 62.5 mg Inject 62.5 mg into the vein every Thursday with hemodialysis. 09/29/13  Yes Joseph Art, DO  calcitRIOL (ROCALTROL) 0.5 MCG capsule Take 2 capsules (1 mcg total) by mouth every Monday, Wednesday, and Friday with hemodialysis. Patient not taking: Reported on 09/05/2015 05/23/15   Lonia Blood, MD  Diphenhydramine-APAP 25-500 MG TABS Take 1 tablet by mouth at bedtime as needed. For sleep    Historical Provider, MD  divalproex (DEPAKOTE) 500 MG DR tablet Take 1 tablet (500 mg total) by mouth 2 (two) times daily. Patient not taking: Reported on 09/10/2015 09/05/15   Laurence Spates, MD  multivitamin (RENA-VIT) TABS tablet Take 1 tablet by mouth at bedtime. Patient not taking: Reported on 09/10/2015 09/29/13   Joseph Art, DO  traMADol (ULTRAM) 50 MG tablet Take 1 tablet (50 mg total) by mouth every 6 (six) hours as needed. Patient taking differently: Take 50 mg by mouth every 6 (six) hours as needed for moderate pain.  09/12/15   [name] L [name], DO   BP 113/36 mmHg  Pulse 40  Temp(Src) 98.2 F (36.8 C) (Oral)  Ht 6' (1.829 m)  Wt 90.719 kg  BMI 27.12 kg/m2  SpO2 97% Physical Exam  Constitutional: He is oriented to person, place, and time. He appears listless. No distress.  HENT:  Head: Normocephalic and atraumatic.  Right Ear: External ear normal.  Left Ear: External ear normal.  Mouth/Throat: No oropharyngeal exudate.  Eyes: Conjunctivae are normal. Right eye exhibits no discharge. Left eye exhibits no discharge. No scleral icterus.  Neck: Neck supple. No tracheal deviation present.  Cardiovascular: Normal rate, regular rhythm and intact distal pulses.   Pulmonary/Chest: Effort  normal and breath sounds normal. No stridor. No respiratory distress. He has no wheezes. He has no rales.  Abdominal: Soft. Bowel sounds are normal. He exhibits no distension. There is no tenderness. There is no rebound and no guarding.  Musculoskeletal: He exhibits no edema or tenderness.  Av fistula bilateral ue, erythema and edema surrounding LUE fistula, sutures in place at surgical scar  Neurological: He is oriented to person, place, and time. He appears listless. No cranial nerve deficit (no facial droop, extraocular movements intact, no slurred speech) or sensory deficit. He exhibits normal muscle tone. He displays no seizure activity. Coordination normal.  occsnl muscle spasms noted, generalized weakness, movements slow  Skin: Skin is warm and dry. No rash noted. He is not diaphoretic.  Psychiatric: He has a normal mood and affect.  Nursing note and vitals reviewed.   ED Course  Procedures (including critical care time)  CRITICAL CARE Performed by: ZOXWR,UEA Total critical care time: 35 minutes Critical care time was exclusive of separately billable procedures and treating other patients. Critical care was necessary to treat or prevent imminent or life-threatening deterioration. Critical care was time spent personally by me on the following activities: development of treatment plan with patient and/or surrogate as well as nursing, discussions with consultants, evaluation of patient's response to treatment, examination of patient, obtaining history from patient or surrogate, ordering and performing treatments and interventions, ordering and review of laboratory studies, ordering and review of radiographic studies, pulse oximetry and re-evaluation of patient's condition.  Labs Review Labs Reviewed  COMPREHENSIVE METABOLIC PANEL - Abnormal; Notable for the following:    Sodium 130 (*)    Potassium 6.4 (*)    Chloride 95 (*)    CO2 17 (*)    Glucose, Bld 209 (*)    BUN 135 (*)     Creatinine, Ser 13.62 (*)    Calcium 8.0 (*)    Total Protein 6.4 (*)    ALT 10 (*)    GFR calc non Af Amer 3 (*)    GFR calc Af Amer 3 (*)    Anion gap 18 (*)    All other components within normal limits  CBC WITH DIFFERENTIAL/PLATELET - Abnormal; Notable for the following:    RBC 2.71 (*)    Hemoglobin 8.5 (*)    HCT 26.1 (*)    RDW 15.7 (*)    Platelets 95 (*)    All other components within normal limits  I-STAT CHEM 8, ED - Abnormal; Notable for the following:    Sodium 131 (*)    Potassium 6.3 (*)    Chloride 98 (*)    BUN 131 (*)    Creatinine, Ser 12.70 (*)    Glucose, Bld 195 (*)    Calcium, Ion 0.94 (*)    Hemoglobin 9.2 (*)    HCT 27.0 (*)    All other components within normal limits  PROTIME-INR  URINALYSIS, ROUTINE W REFLEX MICROSCOPIC (NOT AT [facility])    Imaging Review Ct Head Wo Contrast  09/15/2015  CLINICAL DATA:  Acute onset confusion EXAM: CT HEAD WITHOUT CONTRAST TECHNIQUE: Contiguous axial images were obtained from the base of the skull through the vertex without intravenous contrast. COMPARISON:  September 05, 2015 FINDINGS: There is mild diffuse atrophy. There is no intracranial mass, hemorrhage, extra-axial fluid collection, or midline shift. There is small vessel disease throughout the centra semiovale bilaterally. There is no new gray-white compartment lesion. No acute infarct evident. Basal ganglia calcifications probably physiologic in this age group. The bony calvarium appears intact. The mastoid air cells are clear. No intraorbital lesions are identified. IMPRESSION: Atrophy with stable periventricular small vessel disease. No intracranial mass, hemorrhage, or acute appearing infarct. Electronically Signed   By: Bretta Bang III M.D.   On: 09/15/2015 17:24   Dg Chest Port 1 View  09/15/2015  CLINICAL DATA:  82 year old male with history of confusion. Patient reportedly skipped hemodialysis. EXAM: PORTABLE CHEST 1 VIEW COMPARISON:  Chest x-ray 10/01/2013.  FINDINGS: Right internal jugular PermCath with tip terminating at the superior cavoatrial junction. Lung volumes are normal. No consolidative airspace disease. No pleural effusions. No evidence of pulmonary edema. No pneumothorax. No suspicious appearing pulmonary nodule or mass. Heart size is normal. Upper mediastinal contours are  within normal limits. Atherosclerosis in the thoracic aorta. IMPRESSION: 1. No radiographic evidence of acute cardiopulmonary disease. 2. Atherosclerosis. Electronically Signed   By: Trudie [name] M.D.   On: 09/15/2015 17:37   I have personally reviewed and evaluated these images and lab results as part of my medical decision-making.   EKG Interpretation   Date/Time:  Monday September 15 2015 18:02:10 EST Ventricular Rate:  39 PR Interval:    QRS Duration: 100 QT Interval:  484 QTC Calculation: 390 R Axis:   17 Text Interpretation:  bradycarda Junctional rhythm Since last tracing rate  slower Confirmed by Tremar Wickens  MD-J, Mack Alvidrez ([id number]) on 09/15/2015 6:20:30 PM      MDM   Final diagnoses:  Hyperkalemia  Renal failure    The patient's laboratory tests show acute renal failure with metabolic acidosis and hyperkalemia.   She does have bradycardia. I will start him on insulin and glucose and calcium. Patient also has a chronic anemia. His renal failure which related abnormalities may be contributing to his lethargy and confusion. No evidence of acute stroke on CT scan he does not have any focal deficits.  I will consult with nephrology to arrange for dialysis. I will consult the medical service for admission.   Linwood Dibbles, MD 09/15/15 1843  I spoke with Dr Darrick Penna.  He reviewed his dialysis records.  Pt has not been compliant with dialysis for for the last month.  He has been leaving treatments early frequently.  Dr Deterding does not also see a record of him dialyzing this past Friday.  Linwood Dibbles, MD 09/15/15 [phone]

## 2015-09-15 NOTE — ED Notes (Signed)
Pt arrives via [facility]. Pt states he was unable to urinate since 0400 this morning. Pt was supposed to have dialysis today, but the center was closed. Pt received dialysis M,W,F and last received dialysis on Friday. Pt has no other complaints.

## 2015-09-15 NOTE — Telephone Encounter (Signed)
Son, [name], called and stated that patient is not eating, shaking real bad and cannot urinate. Stated that he has had to go all day but cannot and patient is in renal failure. Son wanted to know if they should just take him to ER. Instructed him to do so. He agreed.

## 2015-09-15 NOTE — H&P (Signed)
[facility] History and Physical  [name] AQT:[id number] DOB: March 01, 1932 DOA: 09/15/2015  Referring physician: Dr. Lynelle [name]. PCP: Andrew Spikes, DO  Specialists: Nephrologist.  Chief Complaint: Difficulty urination and confusion.  HPI: [name] is a 82 y.o. male with history of ESRD on hemodialysis on Monday Wednesday Friday, hypertension, history of bradycardia, seizures, subdural hematoma, anemia and diabetes was brought to the ER the patient had some difficulty urination. Patient states even though he is on dialysis he still urinates 3-4 times a day. He had some difficulty urinating last 2 days. In the ER patient had bladder scan done which showed 400 mL retention and was placed on Foley catheter. Family noticed that patient has been having some confusion. Patient has missed his dialysis on Friday. Patient has history of noncompliance with dialysis. Labs drawn showed potassium 6.3 with EKG showing junctional bradycardia. On call nephrologist [name] has been consulted. Patient was given Late 50 g along with calcium gluconate insulin and D50. On my exam patient is not in distress and follows commands. Moves all extremities.   Review of Systems: As presented in the history of presenting illness, rest negative.  Past Medical History  Diagnosis Date  . Hypertension   . Diabetes mellitus without complication (HCC)   . Arthritis   . History of [name] transfusion   . GI bleed   . ESRD (end stage renal disease) (HCC)   . Seizure (HCC)   . Dialysis patient (HCC)   . Subdural hematoma (HCC) 05/2015    from fall  . Sleep apnea     wears CPAP n/c  . Pulmonary hypertension (HCC)     severe pulmonary HTN by 01/14/13 echo   Past Surgical History  Procedure Laterality Date  . Esophagogastroduodenoscopy  10/03/2012    Procedure: ESOPHAGOGASTRODUODENOSCOPY (EGD);  Surgeon: [name];  Location: [facility] ENDOSCOPY;  Service: Endoscopy;  Laterality: N/A;  . Colonoscopy  10/04/2012     Procedure: COLONOSCOPY;  Surgeon: [name];  Location: [facility] ENDOSCOPY;  Service: Endoscopy;  Laterality: N/A;  . Colonoscopy with esophagogastroduodenoscopy (egd) Left 11/30/2012    Procedure: COLONOSCOPY WITH ESOPHAGOGASTRODUODENOSCOPY (EGD);  Surgeon: [name];  Location: [facility] ENDOSCOPY;  Service: Endoscopy;  Laterality: Left;  . Spine surgery  2004    back fusion/ Andrew Penumalli,[name]  . Retinal laser procedure Right 04/2015  . Av fistula placement Left 09/09/2015    Procedure: INSERTION OF ARTERIOVENOUS  GORE-TEX GRAFT Left  ARM;  Surgeon: Andrew Kerns, [name];  Location: [facility] OR;  Service: Vascular;  Laterality: Left;   Social History:  reports that he has never smoked. He has never used smokeless tobacco. He reports that he does not drink alcohol or use illicit drugs. Where does patient live home. Can patient participate in ADLs? Yes.  Allergies  Allergen Reactions  . Aspirin Other (See Comments)    Bleeding   . Lactose Intolerance (Gi) Diarrhea    Family History:  Family History  Problem Relation Age of Onset  . Sudden death Mother   . Sudden death Father       Prior to Admission medications   Medication Sig Start Date End Date Taking? Authorizing Provider  amLODipine (NORVASC) 10 MG tablet Take 10 mg by mouth Daily.  08/15/12  Yes Historical Provider, [name]  calcium acetate (PHOSLO) 667 MG capsule Take 3 capsules by mouth three times a day Patient taking differently: Take 2,001 mg by mouth. Take 3 capsules by mouth three times a day  07/15/15  Yes Andrew Seller, [name]  cinacalcet (SENSIPAR) 30 MG tablet Take 2 tablets (60 mg total) by mouth daily with supper. 06/22/15  Yes Andrew Hartshorn, [name]  colchicine 0.6 MG tablet Take 0.6 mg by mouth as needed. For gout 02/14/14  Yes Historical Provider, [name]  darbepoetin (ARANESP) 60 MCG/0.3ML SOLN injection Inject 0.3 mLs (60 mcg total) into the vein every Thursday with hemodialysis. Patient taking differently: Inject 60 mcg into the  vein every 7 (seven) days.  09/29/13  Yes Andrew Art, DO  folic acid (FOLVITE) 1 MG tablet Take 1 tablet (1 mg total) by mouth daily. 09/29/13  Yes Andrew Art, DO  gabapentin (NEURONTIN) 100 MG capsule Take 1 capsule (100 mg total) by mouth daily. 05/23/15  Yes Andrew Blood, [name]  HUMALOG KWIKPEN 100 UNIT/ML KiwkPen Inject 0-10 Units into the skin 3 (three) times daily. Sliding Scale 04/16/15  Yes Historical Provider, [name]  lacosamide (VIMPAT) 50 MG TABS tablet Take 1 tablet (50 mg total) by mouth every Monday, Wednesday, and Friday with hemodialysis. 07/28/15  Yes Andrew Art, DO  Lacosamide 100 MG TABS Take 1 tablet (100 mg total) by mouth 2 (two) times daily. Patient taking differently: Take 100 mg by mouth as directed. Take 1 tablet twice a day on NON dialysis days 07/21/15  Yes Andrew L [name], DO  sodium chloride 0.9 % SOLN 100 mL with ferric gluconate 12.5 MG/ML SOLN 62.5 mg Inject 62.5 mg into the vein every Thursday with hemodialysis. 09/29/13  Yes Andrew Art, DO  calcitRIOL (ROCALTROL) 0.5 MCG capsule Take 2 capsules (1 mcg total) by mouth every Monday, Wednesday, and Friday with hemodialysis. Patient not taking: Reported on 09/05/2015 05/23/15   Andrew Blood, [name]  Diphenhydramine-APAP 25-500 MG TABS Take 1 tablet by mouth at bedtime as needed. For sleep    Historical Provider, [name]  divalproex (DEPAKOTE) 500 MG DR tablet Take 1 tablet (500 mg total) by mouth 2 (two) times daily. Patient not taking: Reported on 09/10/2015 09/05/15   Andrew Spates, [name]  multivitamin (RENA-VIT) TABS tablet Take 1 tablet by mouth at bedtime. Patient not taking: Reported on 09/10/2015 09/29/13   Andrew Art, DO  traMADol (ULTRAM) 50 MG tablet Take 1 tablet (50 mg total) by mouth every 6 (six) hours as needed. Patient taking differently: Take 50 mg by mouth every 6 (six) hours as needed for moderate pain.  09/12/15   Andrew Balo, DO    Physical Exam: Filed Vitals:   09/15/15 1915 09/15/15  1930 09/15/15 2000 09/15/15 2133  BP: 158/53 162/55 139/52 123/79  Pulse: 45 32 44   Temp:    97.5 F (36.4 C)  TempSrc:    Oral  Resp: 22 11 13 16   Height:    6' (1.829 m)  Weight:    96.979 kg (213 lb 12.8 oz)  SpO2: 100% 98% 99% 92%     General:  Moderately built and nourished.  Eyes: Anicteric no pallor.  ENT: No discharge from the ears eyes nose or mouth.  Neck: No mass felt.  Cardiovascular: S1 and S2 heard.  Respiratory: No rhonchi or crepitations.  Abdomen: Soft nontender bowel sounds present.  Skin: No rash.  Musculoskeletal: No edema.  Psychiatric: Patient is presently alert awake oriented to his name place.  Neurologic: Alert awake oriented to his name place. Moves all extremities.  Labs on Admission:  Basic Metabolic Panel:  Recent Labs Lab 09/09/15 0718 09/10/15 1129 09/15/15  1702 09/15/15 1705  NA 140 140 130* 131*  K 4.8 5.8* 6.4* 6.3*  CL  --  101 95* 98*  CO2  --  25 17*  --   GLUCOSE 133* 113* 209* 195*  BUN  --  87* 135* 131*  CREATININE  --  9.40* 13.62* 12.70*  CALCIUM  --  8.6* 8.0*  --    Liver Function Tests:  Recent Labs Lab 09/10/15 1129 09/15/15 1702  AST 15 16  ALT 11* 10*  ALKPHOS 41 49  BILITOT 0.7 0.6  PROT 6.4* 6.4*  ALBUMIN 3.5 3.5   No results for input(s): LIPASE, AMYLASE in the last 168 hours. No results for input(s): AMMONIA in the last 168 hours. CBC:  Recent Labs Lab 09/09/15 0718 09/10/15 1129 09/15/15 1702 09/15/15 1705  WBC  --  4.4 5.7  --   NEUTROABS  --  2.7 3.8  --   HGB 10.9* 9.9* 8.5* 9.2*  HCT 32.0* 30.4* 26.1* 27.0*  MCV  --  97.4 96.3  --   PLT  --  51* 95*  --    Cardiac Enzymes: No results for input(s): CKTOTAL, CKMB, CKMBINDEX, TROPONINI in the last 168 hours.  BNP (last 3 results) No results for input(s): BNP in the last 8760 hours.  ProBNP (last 3 results) No results for input(s): PROBNP in the last 8760 hours.  CBG:  Recent Labs Lab 09/09/15 0621 09/09/15 1016   GLUCAP 130* 116*    Radiological Exams on Admission: Ct Head Wo Contrast  09/15/2015  CLINICAL DATA:  Acute onset confusion EXAM: CT HEAD WITHOUT CONTRAST TECHNIQUE: Contiguous axial images were obtained from the base of the skull through the vertex without intravenous contrast. COMPARISON:  September 05, 2015 FINDINGS: There is mild diffuse atrophy. There is no intracranial mass, hemorrhage, extra-axial fluid collection, or midline shift. There is small vessel disease throughout the centra semiovale bilaterally. There is no new gray-white compartment lesion. No acute infarct evident. Basal ganglia calcifications probably physiologic in this age group. The bony calvarium appears intact. The mastoid air cells are clear. No intraorbital lesions are identified. IMPRESSION: Atrophy with stable periventricular small vessel disease. No intracranial mass, hemorrhage, or acute appearing infarct. Electronically Signed   By: Bretta Bang III M.D.   On: 09/15/2015 17:24   Dg Chest Port 1 View  09/15/2015  CLINICAL DATA:  82 year old male with history of confusion. Patient reportedly skipped hemodialysis. EXAM: PORTABLE CHEST 1 VIEW COMPARISON:  Chest x-ray 10/01/2013. FINDINGS: Right internal jugular PermCath with tip terminating at the superior cavoatrial junction. Lung volumes are normal. No consolidative airspace disease. No pleural effusions. No evidence of pulmonary edema. No pneumothorax. No suspicious appearing pulmonary nodule or mass. Heart size is normal. Upper mediastinal contours are within normal limits. Atherosclerosis in the thoracic aorta. IMPRESSION: 1. No radiographic evidence of acute cardiopulmonary disease. 2. Atherosclerosis. Electronically Signed   By: Trudie [name] M.D.   On: 09/15/2015 17:37    EKG: Independently reviewed. Junctional bradycardia.  Assessment/Plan Active Problems:   history of AVM (arteriovenous malformation) of colon with hemorrhage   Anemia in chronic kidney  disease   ESRD on dialysis [facility])   Seizure disorder (HCC)   Bradycardia   Acute encephalopathy   Renal failure   Uremia   Hyperkalemia   1. Acute encephalopathy secondary to uremia - appreciate nephrology consult. I have discussed with on-call nephrologist [name]. Plan is to have dialysis first thing in the morning. Repeat metabolic panel in  a.m. Patient did receive Kayexalate D50 IV insulin and calcium gluconate for hyperkalemia. 2. Bradycardia with junctional rhythm - patient had similar junctional Medicare previously in the setting of hyperkalemia. Closely observe and step down unit I think patient's heart rhythm may improve with dialysis. Patient is not on any rate limiting medications. Check TSH. 3. Urinary retention - patient is on Foley catheter. Try to discontinue in a.m. 4. Hypertension - continue home medications. 5. Diabetes mellitus type 2 on sliding scale coverage. 6. Seizure disorder on Vimpat. Patient is also supposed to be on Depakote but has not been taking it not sure of the cause. 7. Chronic anemia - follow CBC.   DVT Prophylaxis heparin.  Code Status: Full code.  Family Communication: Discussed with patient.  Disposition Plan: Admit to inpatient.    Haitham Dolinsky N. [facility] Pager [phone].  If 7PM-7AM, please contact night-coverage [email or web address] Password [facility] 09/15/2015, 10:57 PM

## 2015-09-15 NOTE — Consult Note (Signed)
Reason for Consult:Uremia, ESRD Referring Physician: Dr. Gayleen Orem [name] is an 82 y.o. male.  HPI: 81 yr male with ESRD from DM and HTN.  Came to ED with weakness, shaking, and feeling sick.  Initially told me he was vomiting but then denied.  Does not know date.  Has not dialyzed at [facility] since 09/08/15. Did dialyze here on 09/10/15.  2 of the prior 4 tx at [facility] he dialyzed about 2 h of his prescribed 3h 45 min and prior to that full tx ave 1 of 4.  Claims LBP as cause of that but admits he just doesn't like to be in chair and can't stand to sit for 3 h.    C/O twitching, no appetite, feeling bad.  Has K of 6.4, bicarb of 17, Cr of 13.6/BUN 135. Constitutional: as above, "sick" but cannot say why Eyes: has signif limitation, DM retinopathy Ears, nose, mouth, throat, and face: negative Respiratory: negative Cardiovascular: negative Gastrointestinal: ?n&V Genitourinary:negative Integument/breast: neg Hematologic/lymphatic: anemia Musculoskeletal:LBP but cannot describe best position Endocrine: DM Allergic/Immunologic: ASA   Dialyzes at [facility] on MWF . Primary Nephrologist [place]. . , Dialyzer 180, Heparin tight. Access RIJ cath, has 6d old LUA AVG.  Past Medical History  Diagnosis Date  . Hypertension   . Diabetes mellitus without complication ([place])   . Arthritis   . History of blood transfusion   . GI bleed   . ESRD (end stage renal disease) ([place])   . Seizure ([place])   . Dialysis patient ([place])   . Subdural hematoma ([place]) 05/2015    from fall  . Sleep apnea     wears CPAP n/c  . Pulmonary hypertension ([place])     severe pulmonary HTN by 01/14/13 echo    Past Surgical History  Procedure Laterality Date  . Esophagogastroduodenoscopy  10/03/2012    Procedure: ESOPHAGOGASTRODUODENOSCOPY (EGD);  Surgeon: Beryle Beams, MD;  Location: [facility] ENDOSCOPY;  Service: Endoscopy;  Laterality: N/A;  . Colonoscopy  10/04/2012    Procedure: COLONOSCOPY;  Surgeon: Beryle Beams, MD;   Location: [facility] ENDOSCOPY;  Service: Endoscopy;  Laterality: N/A;  . Colonoscopy with esophagogastroduodenoscopy (egd) Left 11/30/2012    Procedure: COLONOSCOPY WITH ESOPHAGOGASTRODUODENOSCOPY (EGD);  Surgeon: Beryle Beams, MD;  Location: [facility] ENDOSCOPY;  Service: Endoscopy;  Laterality: Left;  . Spine surgery  2004    back fusion/ [facility] Penumalli,MD  . Retinal laser procedure Right 04/2015  . Av fistula placement Left 09/09/2015    Procedure: INSERTION OF ARTERIOVENOUS  GORE-TEX GRAFT Left  ARM;  Surgeon: Elam Dutch, MD;  Location: [facility] OR;  Service: Vascular;  Laterality: Left;    Family History  Problem Relation Age of Onset  . Sudden death Mother   . Sudden death Father     Social History:  reports that he has never smoked. He has never used smokeless tobacco. He reports that he does not drink alcohol or use illicit drugs.  Allergies:  Allergies  Allergen Reactions  . Aspirin Other (See Comments)    Bleeding   . Lactose Intolerance (Gi) Diarrhea    Medications:  I have reviewed the patient's current medications. Prior to Admission:  Prescriptions prior to admission  Medication Sig Dispense Refill Last Dose  . amLODipine (NORVASC) 10 MG tablet Take 10 mg by mouth Daily.    09/14/2015 at Unknown time  . calcium acetate (PHOSLO) 667 MG capsule Take 3 capsules by mouth three times a day (Patient taking differently: Take 2,001 mg  by mouth. Take 3 capsules by mouth three times a day) 180 capsule 3 09/15/2015 at Unknown time  . cinacalcet (SENSIPAR) 30 MG tablet Take 2 tablets (60 mg total) by mouth daily with supper. 30 tablet 0 09/14/2015 at Unknown time  . colchicine 0.6 MG tablet Take 0.6 mg by mouth as needed. For gout   09/15/2015 at Unknown time  . darbepoetin (ARANESP) 60 MCG/0.3ML SOLN injection Inject 0.3 mLs (60 mcg total) into the vein every Thursday with hemodialysis. (Patient taking differently: Inject 60 mcg into the vein every 7 (seven) days. ) 4.2 mL  Past Week at Unknown time   . folic acid (FOLVITE) 1 MG tablet Take 1 tablet (1 mg total) by mouth daily. 30 tablet 0 09/15/2015 at Unknown time  . gabapentin (NEURONTIN) 100 MG capsule Take 1 capsule (100 mg total) by mouth daily. 30 capsule 0 09/15/2015 at Unknown time  . HUMALOG KWIKPEN 100 UNIT/ML KiwkPen Inject 0-10 Units into the skin 3 (three) times daily. Sliding Scale  0 unknown at Unknown time  . lacosamide (VIMPAT) 50 MG TABS tablet Take 1 tablet (50 mg total) by mouth every Monday, Wednesday, and Friday with hemodialysis. 15 tablet 1 09/15/2015 at Unknown time  . Lacosamide 100 MG TABS Take 1 tablet (100 mg total) by mouth 2 (two) times daily. (Patient taking differently: Take 100 mg by mouth as directed. Take 1 tablet twice a day on NON dialysis days) 60 tablet 3 09/14/2015 at Unknown time  . sodium chloride 0.9 % SOLN 100 mL with ferric gluconate 12.5 MG/ML SOLN 62.5 mg Inject 62.5 mg into the vein every Thursday with hemodialysis.   09/15/2015 at Unknown time  . calcitRIOL (ROCALTROL) 0.5 MCG capsule Take 2 capsules (1 mcg total) by mouth every Monday, Wednesday, and Friday with hemodialysis. (Patient not taking: Reported on 09/05/2015)   Not Taking at Unknown time  . Diphenhydramine-APAP 25-500 MG TABS Take 1 tablet by mouth at bedtime as needed. For sleep   unknown at unknown  . divalproex (DEPAKOTE) 500 MG DR tablet Take 1 tablet (500 mg total) by mouth 2 (two) times daily. (Patient not taking: Reported on 09/10/2015) 60 tablet 0 Not Taking at Unknown time  . multivitamin (RENA-VIT) TABS tablet Take 1 tablet by mouth at bedtime. (Patient not taking: Reported on 09/10/2015) 30 tablet 0 Not Taking at Unknown time  . traMADol (ULTRAM) 50 MG tablet Take 1 tablet (50 mg total) by mouth every 6 (six) hours as needed. (Patient taking differently: Take 50 mg by mouth every 6 (six) hours as needed for moderate pain. ) 20 tablet 0 unknown at unknown   Calcitriol .27mg MWF.   Results for orders placed or performed during the  hospital encounter of 09/15/15 (from the past 48 hour(s))  Comprehensive metabolic panel     Status: Abnormal   Collection Time: 09/15/15  5:02 PM  Result Value Ref Range   Sodium 130 (L) 135 - 145 mmol/L   Potassium 6.4 (HH) 3.5 - 5.1 mmol/L    Comment: NO VISIBLE HEMOLYSIS CRITICAL RESULT CALLED TO, READ BACK BY AND VERIFIED WITH: RN STEPHENS,N AT 1750 [id number]    Chloride 95 (L) 101 - 111 mmol/L   CO2 17 (L) 22 - 32 mmol/L   Glucose, Bld 209 (H) 65 - 99 mg/dL   BUN 135 (H) 6 - 20 mg/dL   Creatinine, Ser 13.62 (H) 0.61 - 1.24 mg/dL   Calcium 8.0 (L) 8.9 - 10.3 mg/dL   Total  Protein 6.4 (L) 6.5 - 8.1 g/dL   Albumin 3.5 3.5 - 5.0 g/dL   AST 16 15 - 41 U/L   ALT 10 (L) 17 - 63 U/L   Alkaline Phosphatase 49 38 - 126 U/L   Total Bilirubin 0.6 0.3 - 1.2 mg/dL   GFR calc non Af Amer 3 (L) >60 mL/min   GFR calc Af Amer 3 (L) >60 mL/min    Comment: (NOTE) The eGFR has been calculated using the CKD EPI equation. This calculation has not been validated in all clinical situations. eGFR's persistently <60 mL/min signify possible Chronic Kidney Disease.    Anion gap 18 (H) 5 - 15  CBC WITH DIFFERENTIAL     Status: Abnormal   Collection Time: 09/15/15  5:02 PM  Result Value Ref Range   WBC 5.7 4.0 - 10.5 K/uL   RBC 2.71 (L) 4.22 - 5.81 MIL/uL   Hemoglobin 8.5 (L) 13.0 - 17.0 g/dL   HCT 26.1 (L) 39.0 - 52.0 %   MCV 96.3 78.0 - 100.0 fL   MCH 31.4 26.0 - 34.0 pg   MCHC 32.6 30.0 - 36.0 g/dL   RDW 15.7 (H) 11.5 - 15.5 %   Platelets 95 (L) 150 - 400 K/uL    Comment: CONSISTENT WITH PREVIOUS RESULT   Neutrophils Relative % 67 %   Neutro Abs 3.8 1.7 - 7.7 K/uL   Lymphocytes Relative 26 %   Lymphs Abs 1.5 0.7 - 4.0 K/uL   Monocytes Relative 4 %   Monocytes Absolute 0.2 0.1 - 1.0 K/uL   Eosinophils Relative 3 %   Eosinophils Absolute 0.2 0.0 - 0.7 K/uL   Basophils Relative 0 %   Basophils Absolute 0.0 0.0 - 0.1 K/uL  Protime-INR     Status: None   Collection Time: 09/15/15   5:02 PM  Result Value Ref Range   Prothrombin Time 15.0 11.6 - 15.2 seconds   INR 1.16 0.00 - 1.49  I-Stat Chem 8, ED  (not at [facility])     Status: Abnormal   Collection Time: 09/15/15  5:05 PM  Result Value Ref Range   Sodium 131 (L) 135 - 145 mmol/L   Potassium 6.3 (HH) 3.5 - 5.1 mmol/L   Chloride 98 (L) 101 - 111 mmol/L   BUN 131 (H) 6 - 20 mg/dL   Creatinine, Ser 12.70 (H) 0.61 - 1.24 mg/dL   Glucose, Bld 195 (H) 65 - 99 mg/dL   Calcium, Ion 0.94 (L) 1.13 - 1.30 mmol/L   TCO2 19 0 - 100 mmol/L   Hemoglobin 9.2 (L) 13.0 - 17.0 g/dL   HCT 27.0 (L) 39.0 - 52.0 %   Comment NOTIFIED PHYSICIAN   Urinalysis, Routine w reflex microscopic (not at [facility])     Status: Abnormal   Collection Time: 09/15/15  7:25 PM  Result Value Ref Range   Color, Urine YELLOW YELLOW   APPearance CLEAR CLEAR   Specific Gravity, Urine 1.012 1.005 - 1.030   pH 6.0 5.0 - 8.0   Glucose, UA 250 (A) NEGATIVE mg/dL   Hgb urine dipstick NEGATIVE NEGATIVE   Bilirubin Urine NEGATIVE NEGATIVE   Ketones, ur NEGATIVE NEGATIVE mg/dL   Protein, ur 100 (A) NEGATIVE mg/dL   Nitrite NEGATIVE NEGATIVE   Leukocytes, UA NEGATIVE NEGATIVE  Urine microscopic-add on     Status: Abnormal   Collection Time: 09/15/15  7:25 PM  Result Value Ref Range   Squamous Epithelial / LPF 0-5 (A) NONE SEEN  WBC, UA 0-5 0 - 5 WBC/hpf   RBC / HPF NONE SEEN 0 - 5 RBC/hpf   Bacteria, UA NONE SEEN NONE SEEN   Urine-Other AMORPHOUS URATES/PHOSPHATES     Ct Head Wo Contrast  09/15/2015  CLINICAL DATA:  Acute onset confusion EXAM: CT HEAD WITHOUT CONTRAST TECHNIQUE: Contiguous axial images were obtained from the base of the skull through the vertex without intravenous contrast. COMPARISON:  September 05, 2015 FINDINGS: There is mild diffuse atrophy. There is no intracranial mass, hemorrhage, extra-axial fluid collection, or midline shift. There is small vessel disease throughout the centra semiovale bilaterally. There is no new gray-white  compartment lesion. No acute infarct evident. Basal ganglia calcifications probably physiologic in this age group. The bony calvarium appears intact. The mastoid air cells are clear. No intraorbital lesions are identified. IMPRESSION: Atrophy with stable periventricular small vessel disease. No intracranial mass, hemorrhage, or acute appearing infarct. Electronically Signed   By: Lowella Grip III M.D.   On: 09/15/2015 17:24   Dg Chest Port 1 View  09/15/2015  CLINICAL DATA:  82 year old male with history of confusion. Patient reportedly skipped hemodialysis. EXAM: PORTABLE CHEST 1 VIEW COMPARISON:  Chest x-ray 10/01/2013. FINDINGS: Right internal jugular PermCath with tip terminating at the superior cavoatrial junction. Lung volumes are normal. No consolidative airspace disease. No pleural effusions. No evidence of pulmonary edema. No pneumothorax. No suspicious appearing pulmonary nodule or mass. Heart size is normal. Upper mediastinal contours are within normal limits. Atherosclerosis in the thoracic aorta. IMPRESSION: 1. No radiographic evidence of acute cardiopulmonary disease. 2. Atherosclerosis. Electronically Signed   By: Vinnie Langton M.D.   On: 09/15/2015 17:37    ROS Blood pressure 139/52, pulse 44, temperature 98.2 F (36.8 C), temperature source Oral, resp. rate 13, height 6' (1.829 m), weight 90.719 kg (200 lb), SpO2 99 %. Physical Exam Physical Examination: General appearance - overweight and twitching Mental status - OX2, changing stories,  Eyes - funduscopic exam abnormal DM retinopathy Ears - bilateral TM's and external ear canals normal Mouth - mucous membranes moist, pharynx normal without lesions Neck - adenopathy noted PCL Lymphatics - posterior cervical nodes Chest - decreased bs, no R,R, W, RIJ chest wall PC Heart - S1 and S2 normal, systolic murmur Gr 2/6 at apex Abdomen - liver down 4 cm pos bs, soft,nontender Extremities - No DP pulses,  Clotted RUA AVF.  New LUA  AVF, some edema and erythema of LUA, no drainage Skin - dry trophic  Changes [place]ix[place]  Assessment/Plan: 1 Uremia, ^ K, acidemia, from nonadherence.  Cannot dialyze effic with severe uremia as risk of dysequilibrium.    2 NONADHERENCE primary issue, need efforts to assist with back pain but not sure that is the primary issue.  Needs longer HD to avoid prob.  Suggest Palliative see him as he is slowly killing himself.  Will discuss with staff at [facility] to see if can assist but suspect no intervention is going to help him. 3 ESRD with do slow ineffic Hd and repeat 4 Twitching.  Uremia primary probably but lower Sz threshold with it also and has sz. 5 DM need control 6 DDD  Will need PT, and see if can assist with sitting in chair by some mechanism 7 anemia has not been present at HD to monitor and needs resumption. P HD, Vit D, counsel, Rec Palliative Care see him,  Control DM,       Jimi Schappert L 09/15/2015, 9:20 PM

## 2015-09-15 NOTE — Telephone Encounter (Signed)
Agree.  He may have been having a focal seizure of some sort.  He cannot stay out of the hospital lately.

## 2015-09-16 LAB — COMPREHENSIVE METABOLIC PANEL
ALK PHOS: 44 U/L (ref 38–126)
ALT: 9 U/L — ABNORMAL LOW (ref 17–63)
ANION GAP: 18 — AB (ref 5–15)
AST: 15 U/L (ref 15–41)
Albumin: 3.4 g/dL — ABNORMAL LOW (ref 3.5–5.0)
BUN: 140 mg/dL — ABNORMAL HIGH (ref 6–20)
CALCIUM: 7.8 mg/dL — AB (ref 8.9–10.3)
CO2: 20 mmol/L — AB (ref 22–32)
Chloride: 98 mmol/L — ABNORMAL LOW (ref 101–111)
Creatinine, Ser: 13.98 mg/dL — ABNORMAL HIGH (ref 0.61–1.24)
GFR calc non Af Amer: 3 mL/min — ABNORMAL LOW (ref >60)
GFR, EST AFRICAN AMERICAN: 3 mL/min — AB (ref >60)
Glucose, Bld: 135 mg/dL — ABNORMAL HIGH (ref 65–99)
POTASSIUM: 6 mmol/L — AB (ref 3.5–5.1)
SODIUM: 136 mmol/L (ref 135–145)
TOTAL PROTEIN: 6.1 g/dL — AB (ref 6.5–8.1)
Total Bilirubin: 0.5 mg/dL (ref 0.3–1.2)

## 2015-09-16 LAB — CBC
HEMATOCRIT: 25.1 % — AB (ref 39.0–52.0)
HEMOGLOBIN: 8.5 g/dL — AB (ref 13.0–17.0)
MCH: 32.2 pg (ref 26.0–34.0)
MCHC: 33.9 g/dL (ref 30.0–36.0)
MCV: 95.1 fL (ref 78.0–100.0)
Platelets: 92 10*3/uL — ABNORMAL LOW (ref 150–400)
RBC: 2.64 MIL/uL — ABNORMAL LOW (ref 4.22–5.81)
RDW: 15.5 % (ref 11.5–15.5)
WBC: 5.9 10*3/uL (ref 4.0–10.5)

## 2015-09-16 LAB — CBC WITH DIFFERENTIAL/PLATELET
BASOS ABS: 0 10*3/uL (ref 0.0–0.1)
BASOS PCT: 0 %
Eosinophils Absolute: 0.2 10*3/uL (ref 0.0–0.7)
Eosinophils Relative: 3 %
HEMATOCRIT: 26.7 % — AB (ref 39.0–52.0)
HEMOGLOBIN: 9 g/dL — AB (ref 13.0–17.0)
LYMPHS PCT: 36 %
Lymphs Abs: 2.1 10*3/uL (ref 0.7–4.0)
MCH: 32.3 pg (ref 26.0–34.0)
MCHC: 33.7 g/dL (ref 30.0–36.0)
MCV: 95.7 fL (ref 78.0–100.0)
MONOS PCT: 4 %
Monocytes Absolute: 0.2 10*3/uL (ref 0.1–1.0)
NEUTROS ABS: 3.3 10*3/uL (ref 1.7–7.7)
NEUTROS PCT: 57 %
Platelets: 99 10*3/uL — ABNORMAL LOW (ref 150–400)
RBC: 2.79 MIL/uL — ABNORMAL LOW (ref 4.22–5.81)
RDW: 15.5 % (ref 11.5–15.5)
WBC: 5.7 10*3/uL (ref 4.0–10.5)

## 2015-09-16 LAB — RENAL FUNCTION PANEL
ALBUMIN: 3.2 g/dL — AB (ref 3.5–5.0)
ANION GAP: 16 — AB (ref 5–15)
BUN: 141 mg/dL — AB (ref 6–20)
CO2: 19 mmol/L — ABNORMAL LOW (ref 22–32)
Calcium: 7.6 mg/dL — ABNORMAL LOW (ref 8.9–10.3)
Chloride: 101 mmol/L (ref 101–111)
Creatinine, Ser: 13.78 mg/dL — ABNORMAL HIGH (ref 0.61–1.24)
GFR calc non Af Amer: 3 mL/min — ABNORMAL LOW (ref >60)
GFR, EST AFRICAN AMERICAN: 3 mL/min — AB (ref >60)
Glucose, Bld: 138 mg/dL — ABNORMAL HIGH (ref 65–99)
PHOSPHORUS: 7 mg/dL — AB (ref 2.5–4.6)
Potassium: 5.6 mmol/L — ABNORMAL HIGH (ref 3.5–5.1)
SODIUM: 136 mmol/L (ref 135–145)

## 2015-09-16 LAB — MRSA PCR SCREENING: MRSA by PCR: NEGATIVE

## 2015-09-16 LAB — GLUCOSE, CAPILLARY
GLUCOSE-CAPILLARY: 215 mg/dL — AB (ref 65–99)
GLUCOSE-CAPILLARY: 206 mg/dL — AB (ref 65–99)
Glucose-Capillary: 130 mg/dL — ABNORMAL HIGH (ref 65–99)

## 2015-09-16 LAB — TSH: TSH: 1.621 u[IU]/mL (ref 0.350–4.500)

## 2015-09-16 MED ORDER — SODIUM CHLORIDE 0.9 % IV SOLN: 100.0000 mL | INTRAVENOUS | Status: DC | PRN

## 2015-09-16 MED ORDER — HEPARIN SODIUM (PORCINE) 1000 UNIT/ML DIALYSIS
100.0000 [IU]/kg | INTRAMUSCULAR | Status: DC | PRN
  Filled 2015-09-16: qty 10

## 2015-09-16 MED ORDER — HEPARIN SODIUM (PORCINE) 1000 UNIT/ML DIALYSIS
1000.0000 [IU] | INTRAMUSCULAR | Status: DC | PRN
  Filled 2015-09-16: qty 1

## 2015-09-16 MED ORDER — LIDOCAINE-PRILOCAINE 2.5-2.5 % EX CREA
1.0000 "application " | TOPICAL_CREAM | CUTANEOUS | Status: DC | PRN
  Filled 2015-09-16: qty 5

## 2015-09-16 MED ORDER — PENTAFLUOROPROP-TETRAFLUOROETH EX AERO
1.0000 | INHALATION_SPRAY | CUTANEOUS | Status: DC | PRN
Start: 2015-09-16 — End: 2015-09-16

## 2015-09-16 MED ORDER — HEPARIN SODIUM (PORCINE) 1000 UNIT/ML DIALYSIS: 1000.0000 [IU] | INTRAMUSCULAR | Status: DC | PRN

## 2015-09-16 MED ORDER — ALTEPLASE 2 MG IJ SOLR
2.0000 mg | Freq: Once | INTRAMUSCULAR | Status: DC | PRN
  Filled 2015-09-16: qty 2

## 2015-09-16 MED ORDER — LIDOCAINE HCL (PF) 1 % IJ SOLN: 5.0000 mL | INTRAMUSCULAR | Status: DC | PRN

## 2015-09-16 MED ORDER — PENTAFLUOROPROP-TETRAFLUOROETH EX AERO: 1.0000 "application " | INHALATION_SPRAY | CUTANEOUS | Status: DC | PRN

## 2015-09-16 MED ORDER — HEPARIN SODIUM (PORCINE) 1000 UNIT/ML DIALYSIS: 100.0000 [IU]/kg | INTRAMUSCULAR | Status: DC | PRN

## 2015-09-16 MED ORDER — ALTEPLASE 2 MG IJ SOLR: 2.0000 mg | Freq: Once | INTRAMUSCULAR | Status: DC | PRN

## 2015-09-16 MED ORDER — LIDOCAINE HCL (PF) 1 % IJ SOLN
5.0000 mL | INTRAMUSCULAR | Status: DC | PRN
Start: 2015-09-16 — End: 2015-09-16

## 2015-09-16 MED ORDER — SODIUM CHLORIDE 0.9 % IV SOLN
100.0000 mL | INTRAVENOUS | Status: DC | PRN
Start: 2015-09-16 — End: 2015-09-16

## 2015-09-16 MED ORDER — LIDOCAINE-PRILOCAINE 2.5-2.5 % EX CREA: 1.0000 "application " | TOPICAL_CREAM | CUTANEOUS | Status: DC | PRN

## 2015-09-16 NOTE — Progress Notes (Signed)
UR Completed Lakeithia Rasor Graves-Bigelow, RN,BSN [phone]  

## 2015-09-16 NOTE — Evaluation (Signed)
Physical Therapy Evaluation Patient Details Name: [name] MRN: [id number] DOB: [birth year] Today's Date: 09/16/2015   History of Present Illness  Patient is a 82 y/o male with hx of HTN, DM, ESRD on HD, subdural hematoma and seizures presents with difficulty urinating and confusion. Pt missed HD on Friday 1/6. Hx of noncompliance with dialysis. Labs showed potassium 6.3; EKG showing junctional bradycardia  Clinical Impression  Patient presents with functional limitations due to deficits listed in PT problem list (see below). Pt with generalized weakness, decreased cognition, safety awareness, balance and overall mobility impacting safety. Pt with fall hx per chart. Requires Min-Mod A for transfer. Limited by back pain and fatigue from HD today. Would benefit from ST SNF to maximize independence and mobility prior to return home.    Follow Up Recommendations SNF;Supervision/Assistance - 24 hour    Equipment Recommendations  None recommended by PT    Recommendations for Other Services OT consult     Precautions / Restrictions Precautions Precautions: Fall Restrictions Weight Bearing Restrictions: No      Mobility  Bed Mobility Overal bed mobility: Needs Assistance Bed Mobility: Supine to Sit     Supine to sit: Mod assist     General bed mobility comments: HOB flat. Increased time and cues. Assist to elevate trunk.  Transfers Overall transfer level: Needs assistance Equipment used: Rolling walker (2 wheeled) Transfers: Sit to/from [employer] Sit to Stand: Min assist Stand pivot transfers: Min assist       General transfer comment: Assist to boost from EOB with cues for hand placement. Pt with forward lean with trembling in BUEs. Min A SPT to chair with assist negotiating RW.  Ambulation/Gait                Stairs            Wheelchair Mobility    Modified Rankin (Stroke Patients Only)       Balance Overall balance assessment:  Needs assistance;History of Falls Sitting-balance support: Feet supported;No upper extremity supported Sitting balance-Leahy Scale: Fair     Standing balance support: During functional activity Standing balance-Leahy Scale: Poor Standing balance comment: Reliant on RW and external support for balance.                              Pertinent Vitals/Pain Pain Assessment: Faces Faces Pain Scale: Hurts whole lot Pain Location: back Pain Descriptors / Indicators: Sore;Aching Pain Intervention(s): Heat applied;Monitored during session;Limited activity within patient's tolerance;Repositioned    Home Living Family/patient expects to be discharged to:: Private residence Living Arrangements: Spouse/significant other Available Help at Discharge: Family Type of Home: House Home Access: Ramped entrance     Home Layout: Two level Home Equipment: Environmental consultant - 2 wheels;Bedside commode;Grab bars - toilet;Grab bars - tub/shower;Cane - single point;Shower seat      Prior Function Level of Independence: Needs assistance   Gait / Transfers Assistance Needed: with RW and supervision; recent history of falls  ADL's / Homemaking Assistance Needed: with RW or SPC        Hand Dominance   Dominant Hand: Right    Extremity/Trunk Assessment   Upper Extremity Assessment: Defer to OT evaluation           Lower Extremity Assessment: Generalized weakness         Communication   Communication: No difficulties  Cognition Arousal/Alertness: Awake/alert Behavior During Therapy: WFL for tasks assessed/performed Overall Cognitive Status: History of  cognitive impairments - at baseline (Per chart, hx of dementia (per family members)) Area of Impairment: Problem solving             Problem Solving: Slow processing;Difficulty sequencing;Requires verbal cues General Comments: Pt tangential at times- not answering questions appropriately. "I called lost and found and they sold my teeth  to someone." (pt lost teeth last admission).    General Comments General comments (skin integrity, edema, etc.): Pt's wife present in room during session.    Exercises        Assessment/Plan    PT Assessment Patient needs continued PT services  PT Diagnosis Difficulty walking;Generalized weakness;Acute pain   PT Problem List Decreased strength;Pain;Decreased cognition;Decreased activity tolerance;Decreased balance;Decreased mobility;Decreased safety awareness  PT Treatment Interventions Balance training;Gait training;Functional mobility training;Therapeutic activities;Therapeutic exercise;Patient/family education;DME instruction   PT Goals (Current goals can be found in the Care Plan section) Acute Rehab PT Goals Patient Stated Goal: none stated PT Goal Formulation: With patient Time For Goal Achievement: 09/30/15 Potential to Achieve Goals: Good    Frequency Min 2X/week   Barriers to discharge Decreased caregiver support Not sure level of support elderly wife can provide.    Co-evaluation               End of Session Equipment Utilized During Treatment: Gait belt Activity Tolerance: Patient limited by fatigue;Patient limited by pain Patient left: in chair;with call bell/phone within reach;with family/visitor present (RN notified pt sitting in chair and wife notified to let RN know when leaving,) Nurse Communication: Mobility status;Other (comment) (transfer technique back to bed)         Time: 7829-5621 PT Time Calculation (min) (ACUTE ONLY): 24 min   Charges:   PT Evaluation $PT Eval Moderate Complexity: 1 Procedure PT Treatments $Therapeutic Activity: 8-22 mins   PT G Codes:        Zipporah Finamore A Elvy Mclarty 09/16/2015, 3:54 PM  Mylo Red, PT, DPT [phone]

## 2015-09-16 NOTE — Progress Notes (Signed)
Palliative:   I have called and spoke with Mr. Giampietro daughter-in-law who will speak with her husband about meeting with [place] tomorrow regarding GOC. Will await call back to confirm a time. Thank you for this consult.   Yong Channel, NP Palliative Medicine Team Pager # [phone] (M-F 8a-5p) Team Phone # [phone] (Nights/Weekends)

## 2015-09-16 NOTE — Progress Notes (Signed)
[facility] PROGRESS NOTE  Justun Anaya VQQ:[id number] DOB: [birth year] DOA: 09/15/2015 PCP: [name], DO  Summary 82 yo male with ESRD presented with uremia, bradycardia, hyperkalemia and urinary retention (still makes urine). Missed multiple HD sessions.  Assessment/Plan:  Uremia: resolved  Bradycardia resolved after HD  Hyperkalemia: s/p HD  Noncompliance: Palliative consulted per nephrology recs    Anemia in chronic kidney disease    ESRD on dialysis [facility])    Seizure disorder (HCC)  Urinary retention: keep foley in until OOB. Ordered PT   Code Status:  full Family Communication:   Disposition Plan:  Await pt and PMT consults  Consultants:  Nephrology  palliative  Procedures:     Antibiotics:    HPI/Subjective: Feels better.  Objective: Filed Vitals:   09/16/15 0941 09/16/15 1150  BP: 135/76 107/67  Pulse: 84 80  Temp: 97.5 F (36.4 C) 97.8 F (36.6 C)  Resp: 23 18    Intake/Output Summary (Last 24 hours) at 09/16/15 1211 Last data filed at 09/16/15 1100  Gross per 24 hour  Intake    240 ml  Output   3567 ml  Net  -3327 ml   Filed Weights   09/16/15 0400 09/16/15 0631 09/16/15 0941  Weight: 96.752 kg (213 lb 4.8 oz) 96.5 kg (212 lb 11.9 oz) 93.8 kg (206 lb 12.7 oz)   Tele: NSR  Exam:   General:  A and o. appropriate  Cardiovascular: RRR without MGR  Respiratory: CTA without WRR  Abdomen: S, NT, ND  Ext: no CCE  Basic Metabolic Panel:  Recent Labs Lab 09/10/15 1129 09/15/15 1702 09/15/15 1705 09/16/15 0430 09/16/15 0704  NA 140 130* 131* 136 136  K 5.8* 6.4* 6.3* 6.0* 5.6*  CL 101 95* 98* 98* 101  CO2 25 17*  --  20* 19*  GLUCOSE 113* 209* 195* 135* 138*  BUN 87* 135* 131* 140* 141*  CREATININE 9.40* 13.62* 12.70* 13.98* 13.78*  CALCIUM 8.6* 8.0*  --  7.8* 7.6*  PHOS  --   --   --   --  7.0*   Liver Function Tests:  Recent Labs Lab 09/10/15 1129 09/15/15 1702 09/16/15 0430 09/16/15 0704  AST 15  16 15   --   ALT 11* 10* 9*  --   ALKPHOS 41 49 44  --   BILITOT 0.7 0.6 0.5  --   PROT 6.4* 6.4* 6.1*  --   ALBUMIN 3.5 3.5 3.4* 3.2*   No results for input(s): LIPASE, AMYLASE in the last 168 hours. No results for input(s): AMMONIA in the last 168 hours. CBC:  Recent Labs Lab 09/10/15 1129 09/15/15 1702 09/15/15 1705 09/16/15 0430 09/16/15 0704  WBC 4.4 5.7  --  5.7 5.9  NEUTROABS 2.7 3.8  --  3.3  --   HGB 9.9* 8.5* 9.2* 9.0* 8.5*  HCT 30.4* 26.1* 27.0* 26.7* 25.1*  MCV 97.4 96.3  --  95.7 95.1  PLT 51* 95*  --  99* 92*   Cardiac Enzymes: No results for input(s): CKTOTAL, CKMB, CKMBINDEX, TROPONINI in the last 168 hours. BNP (last 3 results) No results for input(s): BNP in the last 8760 hours.  ProBNP (last 3 results) No results for input(s): PROBNP in the last 8760 hours.  CBG:  Recent Labs Lab 09/15/15 2342 09/16/15 1114  GLUCAP 95 130*    Recent Results (from the past 240 hour(s))  MRSA PCR Screening     Status: None   Collection Time: 09/15/15 10:12 PM  Result  Value Ref Range Status   MRSA by PCR NEGATIVE NEGATIVE Final    Comment:        The GeneXpert MRSA Assay (FDA approved for NASAL specimens only), is one component of a comprehensive MRSA colonization surveillance program. It is not intended to diagnose MRSA infection nor to guide or monitor treatment for MRSA infections.      Studies: Ct Head Wo Contrast  09/15/2015  CLINICAL DATA:  Acute onset confusion EXAM: CT HEAD WITHOUT CONTRAST TECHNIQUE: Contiguous axial images were obtained from the base of the skull through the vertex without intravenous contrast. COMPARISON:  September 05, 2015 FINDINGS: There is mild diffuse atrophy. There is no intracranial mass, hemorrhage, extra-axial fluid collection, or midline shift. There is small vessel disease throughout the centra semiovale bilaterally. There is no new gray-white compartment lesion. No acute infarct evident. Basal ganglia calcifications  probably physiologic in this age group. The bony calvarium appears intact. The mastoid air cells are clear. No intraorbital lesions are identified. IMPRESSION: Atrophy with stable periventricular small vessel disease. No intracranial mass, hemorrhage, or acute appearing infarct. Electronically Signed   By: Bretta Bang III M.D.   On: 09/15/2015 17:24   Dg Chest Port 1 View  09/15/2015  CLINICAL DATA:  82 year old male with history of confusion. Patient reportedly skipped hemodialysis. EXAM: PORTABLE CHEST 1 VIEW COMPARISON:  Chest x-ray 10/01/2013. FINDINGS: Right internal jugular PermCath with tip terminating at the superior cavoatrial junction. Lung volumes are normal. No consolidative airspace disease. No pleural effusions. No evidence of pulmonary edema. No pneumothorax. No suspicious appearing pulmonary nodule or mass. Heart size is normal. Upper mediastinal contours are within normal limits. Atherosclerosis in the thoracic aorta. IMPRESSION: 1. No radiographic evidence of acute cardiopulmonary disease. 2. Atherosclerosis. Electronically Signed   By: Trudie [name] M.D.   On: 09/15/2015 17:37    Scheduled Meds: . amLODipine  10 mg Oral Daily  . calcitRIOL  0.75 mcg Oral QODAY  . calcium acetate  2,001 mg Oral TID WC  . cinacalcet  30 mg Oral Q supper  . [START ON 09/17/2015] darbepoetin (ARANESP) injection - DIALYSIS  200 mcg Intravenous Q Wed-HD  . folic acid  1 mg Oral Daily  . gabapentin  100 mg Oral Daily  . insulin aspart  0-9 Units Subcutaneous TID WC  . lacosamide  100 mg Oral 2 times per day on Sun Tue Thu Sat  . [START ON 09/17/2015] lacosamide  50 mg Oral Q M,W,F-HD  . multivitamin  1 tablet Oral QHS  . sodium chloride  3 mL Intravenous Q12H   Continuous Infusions:   Time spent: 25 minutes  Malcom Selmer L  [facility]  [email or web address], password [facility] 09/16/2015, 12:11 PM  LOS: 1 day

## 2015-09-16 NOTE — Progress Notes (Signed)
[name] Progress Note   Subjective: alert no distress  Filed Vitals:   09/16/15 0828 09/16/15 0900 09/16/15 0927 09/16/15 0941  BP: 131/71 108/68 102/68 135/76  Pulse: 82 84 83 84  Temp:    97.5 F (36.4 C)  TempSrc:    Oral  Resp:    23  Height:      Weight:    93.8 kg (206 lb 12.7 oz)  SpO2:    97%    Inpatient medications: . amLODipine  10 mg Oral Daily  . calcitRIOL  0.75 mcg Oral QODAY  . calcium acetate  2,001 mg Oral TID WC  . cinacalcet  30 mg Oral Q supper  . [START ON 09/17/2015] darbepoetin (ARANESP) injection - DIALYSIS  200 mcg Intravenous Q Wed-HD  . folic acid  1 mg Oral Daily  . gabapentin  100 mg Oral Daily  . insulin aspart  0-9 Units Subcutaneous TID WC  . lacosamide  100 mg Oral 2 times per day on Sun Tue Thu Sat  . [START ON 09/17/2015] lacosamide  50 mg Oral Q M,W,F-HD  . multivitamin  1 tablet Oral QHS  . sodium chloride  3 mL Intravenous Q12H     acetaminophen **OR** acetaminophen, colchicine, ondansetron **OR** ondansetron (ZOFRAN) IV  Exam: Alert elderly male, not in distress No rash, cyanosis or gangrene Sclera anicteric, throat clear NO jvd Chest clear bilat RRR no RMG Abd soft ntnd no mass or ascites, no hsm No joint changes No LE edema Neuro is alert, responsive, a bit sluggish and unclear w his responses, nonfocal  M[facility]  3h  88.5kg  2/2.25 bath  Heparin none  IJ cath (maturing LUA AVF) Rocaltrol 0.75 ug tiw Hb 11.7 no esa, last tfs 89%      Assessment: 1 ESRD missed multiple HD , noncompliance , uremic 2 Hyperkalemia 3 DM per prim 4 DDD / back pain, PT consulted, need help w sitting in a chair 5 Anemia hb up no esa  Plan - Discussed w pts son, says pt has dementia and memory not major issue but starting to refuse things, including dialysis. Says he will talk to his sister and his father. Says they will consider "comfort" as well given QOL issues. HD today and tomorrow.   [name] [facility] pager [phone]    cell [phone] 09/16/2015, 11:07 AM    Recent Labs Lab 09/15/15 1702 09/15/15 1705 09/16/15 0430 09/16/15 0704  NA 130* 131* 136 136  K 6.4* 6.3* 6.0* 5.6*  CL 95* 98* 98* 101  CO2 17*  --  20* 19*  GLUCOSE 209* 195* 135* 138*  BUN 135* 131* 140* 141*  CREATININE 13.62* 12.70* 13.98* 13.78*  CALCIUM 8.0*  --  7.8* 7.6*  PHOS  --   --   --  7.0*    Recent Labs Lab 09/10/15 1129 09/15/15 1702 09/16/15 0430 09/16/15 0704  AST 15 16 15   --   ALT 11* 10* 9*  --   ALKPHOS 41 49 44  --   BILITOT 0.7 0.6 0.5  --   PROT 6.4* 6.4* 6.1*  --   ALBUMIN 3.5 3.5 3.4* 3.2*    Recent Labs Lab 09/10/15 1129 09/15/15 1702 09/15/15 1705 09/16/15 0430 09/16/15 0704  WBC 4.4 5.7  --  5.7 5.9  NEUTROABS 2.7 3.8  --  3.3  --   HGB 9.9* 8.5* 9.2* 9.0* 8.5*  HCT 30.4* 26.1* 27.0* 26.7* 25.1*  MCV 97.4 96.3  --  95.7 95.1  PLT 51* 95*  --  99* 92*

## 2015-09-16 NOTE — Care Management Note (Addendum)
Case Management Note  Patient Details  Name: Amadou Katzenstein MRN: [id number] Date of Birth: 1931/12/21  Subjective/Objective:  Pt admitted for ESRD missed multiple HD treatments due to noncompliance. Pt [place] upon admission. Pt is active with [facility] for [facility] RN, PT, and OT. Palliative Care to consult.              Action/Plan: CM will continue to monitor for disposition needs.    Expected Discharge Date:                  Expected Discharge Plan:     In-House Referral:   N/A  Discharge planning Services   Home with Home Health Services  Post Acute Care Choice:   Home Care, Resumption of Services Choice offered to:   Patient   DME Arranged:   N/A DME Agency:   N/A  HH Arranged:   RN, PT, OT HH Agency:   [facility]  Status of Service:    Completed.  Medicare Important Message Given:    Date Medicare IM Given:    Medicare IM give by:    Date Additional Medicare IM Given:    Additional Medicare Important Message give by:     If discussed at Long Length of Stay Meetings, dates discussed:    Additional Comments:1511 09-18-15 Tomi Bamberger, RN, BSN [phone] CSW did speak with pt in regards to disposition needs. Pt is refusing SNF at this time. Pt was previously active with [place] and wants to return home.  [name] made aware. No further needs at this time.  Gala Lewandowsky, RN 09/16/2015, 1:31 PM

## 2015-09-17 DIAGNOSIS — M549 Dorsalgia, unspecified: Secondary | ICD-10-CM

## 2015-09-17 DIAGNOSIS — E875 Hyperkalemia: Secondary | ICD-10-CM

## 2015-09-17 DIAGNOSIS — Z66 Do not resuscitate: Secondary | ICD-10-CM

## 2015-09-17 DIAGNOSIS — N189 Chronic kidney disease, unspecified: Secondary | ICD-10-CM

## 2015-09-17 DIAGNOSIS — G8929 Other chronic pain: Secondary | ICD-10-CM

## 2015-09-17 DIAGNOSIS — Z992 Dependence on renal dialysis: Secondary | ICD-10-CM

## 2015-09-17 DIAGNOSIS — R001 Bradycardia, unspecified: Secondary | ICD-10-CM

## 2015-09-17 DIAGNOSIS — N186 End stage renal disease: Secondary | ICD-10-CM

## 2015-09-17 DIAGNOSIS — D631 Anemia in chronic kidney disease: Secondary | ICD-10-CM

## 2015-09-17 DIAGNOSIS — G40909 Epilepsy, unspecified, not intractable, without status epilepticus: Secondary | ICD-10-CM

## 2015-09-17 DIAGNOSIS — N19 Unspecified kidney failure: Secondary | ICD-10-CM

## 2015-09-17 DIAGNOSIS — Z515 Encounter for palliative care: Secondary | ICD-10-CM

## 2015-09-17 LAB — CBC
HCT: 24.8 % — ABNORMAL LOW (ref 39.0–52.0)
Hemoglobin: 8.3 g/dL — ABNORMAL LOW (ref 13.0–17.0)
MCH: 31.1 pg (ref 26.0–34.0)
MCHC: 33.5 g/dL (ref 30.0–36.0)
MCV: 92.9 fL (ref 78.0–100.0)
PLATELETS: 90 10*3/uL — AB (ref 150–400)
RBC: 2.67 MIL/uL — AB (ref 4.22–5.81)
RDW: 15 % (ref 11.5–15.5)
WBC: 3.8 10*3/uL — AB (ref 4.0–10.5)

## 2015-09-17 LAB — RENAL FUNCTION PANEL
ALBUMIN: 3.1 g/dL — AB (ref 3.5–5.0)
Anion gap: 14 (ref 5–15)
BUN: 97 mg/dL — AB (ref 6–20)
CALCIUM: 8.2 mg/dL — AB (ref 8.9–10.3)
CHLORIDE: 99 mmol/L — AB (ref 101–111)
CO2: 22 mmol/L (ref 22–32)
CREATININE: 10.86 mg/dL — AB (ref 0.61–1.24)
GFR calc Af Amer: 4 mL/min — ABNORMAL LOW (ref >60)
GFR, EST NON AFRICAN AMERICAN: 4 mL/min — AB (ref >60)
Glucose, Bld: 130 mg/dL — ABNORMAL HIGH (ref 65–99)
Phosphorus: 6.3 mg/dL — ABNORMAL HIGH (ref 2.5–4.6)
Potassium: 4.3 mmol/L (ref 3.5–5.1)
SODIUM: 135 mmol/L (ref 135–145)

## 2015-09-17 LAB — GLUCOSE, CAPILLARY
GLUCOSE-CAPILLARY: 170 mg/dL — AB (ref 65–99)
Glucose-Capillary: 182 mg/dL — ABNORMAL HIGH (ref 65–99)
Glucose-Capillary: 173 mg/dL — ABNORMAL HIGH (ref 65–99)

## 2015-09-17 MED ORDER — CALCITRIOL 0.25 MCG PO CAPS
ORAL_CAPSULE | ORAL | Status: AC
Start: 2015-09-17 — End: 2015-09-17
  Filled 2015-09-17: qty 1

## 2015-09-17 MED ORDER — NEPRO/CARBSTEADY PO LIQD
237.0000 mL | Freq: Two times a day (BID) | ORAL | Status: DC
  Administered 2015-09-17: 237 mL via ORAL
  Filled 2015-09-17 (×6): qty 237

## 2015-09-17 MED ORDER — DARBEPOETIN ALFA 200 MCG/0.4ML IJ SOSY
PREFILLED_SYRINGE | INTRAMUSCULAR | Status: AC
  Administered 2015-09-17: 200 ug via INTRAVENOUS
  Filled 2015-09-17: qty 0.4

## 2015-09-17 MED ORDER — CALCITRIOL 0.5 MCG PO CAPS
ORAL_CAPSULE | ORAL | Status: AC
  Filled 2015-09-17: qty 1

## 2015-09-17 NOTE — Consult Note (Signed)
Consultation Note Date: 09/17/2015   Patient Name: [name]  DOB: July 17, 1932  MRN: [id number]  Age / Sex: 82 y.o., male  PCP: Kermit Balo, DO Referring Physician: Vassie Loll, MD  Reason for Consultation: Establishing goals of care  Mr. [name] is 82 yo male admitted with severe uremic symptoms d/t nonadherence with hemodialysis. He has been signing off early and missing HD sessions d/t complaint of back pain. He also has some underlying dementia and lives at home alone with his wife although his son lives 3 homes down and willing to assist as much as Mr. [name] will allow.   Clinical Assessment/Narrative: I had a long discussion today with Mr. [name], his son [name], and Mitchell's wife [name]. They tell me how they were helping him a lot as far as medications and getting to dialysis, etc but they backed off some to give Mr. [name] his independence and they say that is when everything began going downhill. They were not being told when he was not going to dialysis and [name] takes him to dialysis but has dementia and would not remember that he did not go to relay this info to their children. Mr. [name] was asked very frankly by his son if he wishes to continue dialysis and that if he does he MUST go 3x/week and stay for the complete treatment but he also supported him if he did not wish to continue with dialysis. Mr. [name] very clearly stated that he was NOT ready to die and therefore wanted to continue dialysis. We discussed at length what this entails and what this decision means. We also discussed that he can opt to stop dialysis at any point he so desires - family supportive of his decisions.  Mr. [name] has many reasons today for not completing dialysis including back pain, trust issues with the staff and not being heard, and the fact that he is just tired. We discussed that dialysis is not fun and these problems  are not going to go away. Discussed a plan with orthopedic pillows, heating pad, pre-medication to assist with this back pain but that this will be trial and error and he will likely need assistance from PCP with this as well. Family plans to take him to dialysis to make sure he goes and help advocate at the dialysis center to make him feel safer and hopefully have more consistent care with the staff that he feels he can trust to care for him properly. He also agrees to let [place] work with him to fill his pill boxes as they are concerned he is missing medications.   Finally we did discuss code status and he does choose to be DNR at this time which family supports. We did discuss that there will come a time when dialysis will need to be stopped - be that by his choice or further health complications making this no longer a good option for him. They all seem to have good understanding. Mr. [name] does appear to have some mild dementia but seems fairly understanding of the conversation today. All questions/concerns addressed.   Contacts/Participants in Discussion: Primary Decision Maker: Self and then [employer]   Relationship to Patient son HCPOA: yes   SUMMARY OF RECOMMENDATIONS - DNR - He wishes to continue dialysis - family (son/daughter-in-law) will be more involved to make him more accountable - Will need to follow up with PCP Dr. Renato Gails regarding trials of management of his back pain and making this tolerable during  dialysis (not having pain here so this will need follow up)  Code Status/Advance Care Planning: DNR  Symptom Management:   Back pain chronic but worse with dialysis: Family to provide orthopedic pillows, heating pad. Recommend lidoderm patch. May consider low dose muscle relaxant that is not renally excreted??  Palliative Prophylaxis:   Bowel Regimen, Frequent Pain Assessment and Turn Reposition   Psycho-social/Spiritual:  Support System: Strong Desire for further  Chaplaincy support:no Additional Recommendations: Caregiving  Support/Resources  Prognosis: Unable to determine - poor with continued nonadherence with dialysis.   Discharge Planning: Posey [name] he will agree to SNF rehab. SNF vs home with home health.    Chief Complaint/ Primary Diagnoses: Present on Admission:  . Renal failure . Bradycardia . Anemia in chronic kidney disease . Acute encephalopathy . Uremia  I have reviewed the medical record, interviewed the patient and family, and examined the patient. The following aspects are pertinent.  Past Medical History  Diagnosis Date  . Hypertension   . Diabetes mellitus without complication (HCC)   . Arthritis   . History of blood transfusion   . GI bleed   . ESRD (end stage renal disease) (HCC)   . Seizure (HCC)   . Dialysis patient (HCC)   . Subdural hematoma (HCC) 05/2015    from fall  . Sleep apnea     wears CPAP n/c  . Pulmonary hypertension (HCC)     severe pulmonary HTN by 01/14/13 echo   Social History   Social History  . Marital Status: Married    Spouse Name: [name]  . Number of Children: 2  . Years of Education: PHD   Occupational History  . Retired    Social History Main Topics  . Smoking status: Never Smoker   . Smokeless tobacco: Never Used  . Alcohol Use: No  . Drug Use: No  . Sexual Activity: No   Other Topics Concern  . None   Social History Narrative   Patient currently in [facility] rehab.   Caffeine Use: 1-2 cups occasionally   Family History  Problem Relation Age of Onset  . Sudden death Mother   . Sudden death Father    Scheduled Meds: . amLODipine  10 mg Oral Daily  . calcitRIOL  0.75 mcg Oral QODAY  . calcium acetate  2,001 mg Oral TID WC  . cinacalcet  30 mg Oral Q supper  . darbepoetin (ARANESP) injection - DIALYSIS  200 mcg Intravenous Q Wed-HD  . feeding supplement (NEPRO CARB STEADY)  237 mL Oral BID BM  . folic acid  1 mg Oral Daily  . gabapentin  100 mg  Oral Daily  . insulin aspart  0-9 Units Subcutaneous TID WC  . lacosamide  100 mg Oral 2 times per day on Sun Tue Thu Sat  . lacosamide  50 mg Oral Q M,W,F-HD  . multivitamin  1 tablet Oral QHS  . sodium chloride  3 mL Intravenous Q12H   Continuous Infusions:  PRN Meds:.acetaminophen **OR** acetaminophen, colchicine, ondansetron **OR** ondansetron (ZOFRAN) IV Medications Prior to Admission:  Prior to Admission medications   Medication Sig Start Date End Date Taking? Authorizing Provider  amLODipine (NORVASC) 10 MG tablet Take 10 mg by mouth Daily.  08/15/12  Yes Historical Provider, MD  calcium acetate (PHOSLO) 667 MG capsule Take 3 capsules by mouth three times a day Patient taking differently: Take 2,001 mg by mouth. Take 3 capsules by mouth three times a day 07/15/15  Yes Shanda Bumps  Idolina Primer, NP  cinacalcet (SENSIPAR) 30 MG tablet Take 2 tablets (60 mg total) by mouth daily with supper. 06/22/15  Yes Catarina Hartshorn, MD  colchicine 0.6 MG tablet Take 0.6 mg by mouth as needed. For gout 02/14/14  Yes Historical Provider, MD  darbepoetin (ARANESP) 60 MCG/0.3ML SOLN injection Inject 0.3 mLs (60 mcg total) into the vein every Thursday with hemodialysis. Patient taking differently: Inject 60 mcg into the vein every 7 (seven) days.  09/29/13  Yes Joseph Art, DO  folic acid (FOLVITE) 1 MG tablet Take 1 tablet (1 mg total) by mouth daily. 09/29/13  Yes Joseph Art, DO  gabapentin (NEURONTIN) 100 MG capsule Take 1 capsule (100 mg total) by mouth daily. 05/23/15  Yes Lonia Blood, MD  HUMALOG KWIKPEN 100 UNIT/ML KiwkPen Inject 0-10 Units into the skin 3 (three) times daily. Sliding Scale 04/16/15  Yes Historical Provider, MD  lacosamide (VIMPAT) 50 MG TABS tablet Take 1 tablet (50 mg total) by mouth every Monday, Wednesday, and Friday with hemodialysis. 07/28/15  Yes Joseph Art, DO  Lacosamide 100 MG TABS Take 1 tablet (100 mg total) by mouth 2 (two) times daily. Patient taking differently: Take  100 mg by mouth as directed. Take 1 tablet twice a day on NON dialysis days 07/21/15  Yes [name] L [name], DO  sodium chloride 0.9 % SOLN 100 mL with ferric gluconate 12.5 MG/ML SOLN 62.5 mg Inject 62.5 mg into the vein every Thursday with hemodialysis. 09/29/13  Yes Joseph Art, DO  calcitRIOL (ROCALTROL) 0.5 MCG capsule Take 2 capsules (1 mcg total) by mouth every Monday, Wednesday, and Friday with hemodialysis. Patient not taking: Reported on 09/05/2015 05/23/15   Lonia Blood, MD  Diphenhydramine-APAP 25-500 MG TABS Take 1 tablet by mouth at bedtime as needed. For sleep    Historical Provider, MD  divalproex (DEPAKOTE) 500 MG DR tablet Take 1 tablet (500 mg total) by mouth 2 (two) times daily. Patient not taking: Reported on 09/10/2015 09/05/15   Laurence Spates, MD  multivitamin (RENA-VIT) TABS tablet Take 1 tablet by mouth at bedtime. Patient not taking: Reported on 09/10/2015 09/29/13   Joseph Art, DO  traMADol (ULTRAM) 50 MG tablet Take 1 tablet (50 mg total) by mouth every 6 (six) hours as needed. Patient taking differently: Take 50 mg by mouth every 6 (six) hours as needed for moderate pain.  09/12/15   [name] L [name], DO   Allergies  Allergen Reactions  . Aspirin Other (See Comments)    Bleeding   . Lactose Intolerance (Gi) Diarrhea    Review of Systems  Constitutional: Positive for activity change and fatigue. Negative for appetite change.  Neurological: Positive for weakness.    Physical Exam  Constitutional: He appears well-developed and well-nourished.  HENT:  Head: Normocephalic and atraumatic.  Cardiovascular: Normal rate.   Respiratory: Effort normal. No accessory muscle usage. No tachypnea. No respiratory distress.  GI: Soft. Normal appearance.  Neurological: He is alert.  Mostly oriented to situation.     Vital Signs: BP 109/56 mmHg  Pulse 88  Temp(Src) 97.9 F (36.6 C) (Oral)  Resp 14  Ht 6' (1.829 m)  Wt 93.2 kg (205 lb 7.5 oz)  BMI 27.86 kg/m2   SpO2 97%  SpO2: SpO2: 97 % O2 Device:SpO2: 97 % O2 Flow Rate: .   IO: Intake/output summary:  Intake/Output Summary (Last 24 hours) at 09/17/15 1042 Last data filed at 09/17/15 0637  Gross per 24 hour  Intake    480 ml  Output   1450 ml  Net   -970 ml    LBM: Last BM Date: 09/16/15 Baseline Weight: Weight: 90.719 kg (200 lb) Most recent weight: Weight: 93.2 kg (205 lb 7.5 oz)      Palliative Assessment/Data:  Flowsheet Rows        Most Recent Value   Intake Tab    Referral Department  Hospitalist   Unit at Time of Referral  Cardiac/Telemetry Unit   Palliative Care Primary Diagnosis  Nephrology   Date Notified  09/16/15   Palliative Care Type  New Palliative care   Reason for referral  Clarify Goals of Care   Date of Admission  09/15/15   # of days IP prior to Palliative referral  1   Clinical Assessment    Psychosocial & Spiritual Assessment    Palliative Care Outcomes       Additional Data Reviewed:  CBC:    Component Value Date/Time   WBC 3.8* 09/17/2015 0704   WBC 3.3* 05/13/2015 0925   HGB 8.3* 09/17/2015 0704   HCT 24.8* 09/17/2015 0704   HCT 37.9 05/13/2015 0925   PLT 90* 09/17/2015 0704   PLT 128* 05/13/2015 0925   MCV 92.9 09/17/2015 0704   MCV 104* 05/13/2015 0925   NEUTROABS 3.3 09/16/2015 0430   NEUTROABS 1.6 05/13/2015 0925   LYMPHSABS 2.1 09/16/2015 0430   LYMPHSABS 1.3 05/13/2015 0925   MONOABS 0.2 09/16/2015 0430   EOSABS 0.2 09/16/2015 0430   EOSABS 0.1 05/13/2015 0925   EOSABS 0.1 08/15/2014 1059   BASOSABS 0.0 09/16/2015 0430   BASOSABS 0.0 05/13/2015 0925   Comprehensive Metabolic Panel:    Component Value Date/Time   NA 135 09/17/2015 0704   NA 141 05/13/2015 0925   K 4.3 09/17/2015 0704   CL 99* 09/17/2015 0704   CO2 22 09/17/2015 0704   BUN 97* 09/17/2015 0704   BUN 56* 05/13/2015 0925   CREATININE 10.86* 09/17/2015 0704   GLUCOSE 130* 09/17/2015 0704   GLUCOSE 127* 05/13/2015 0925   CALCIUM 8.2* 09/17/2015 0704   AST  15 09/16/2015 0430   ALT 9* 09/16/2015 0430   ALKPHOS 44 09/16/2015 0430   BILITOT 0.5 09/16/2015 0430   PROT 6.1* 09/16/2015 0430   PROT 7.1 08/15/2014 1059   ALBUMIN 3.1* 09/17/2015 0704   ALBUMIN 4.5 08/15/2014 1059     Time In: 1050 Time Out: 1210 Time Total: Greater than 50%  of this time was spent counseling and coordinating care related to the above assessment and plan.  Signed by: Ulice Bold, NP  Ulice Bold, NP  09/17/2015, 10:42 AM  Please contact Palliative Medicine Team phone at [phone] for questions and concerns.

## 2015-09-17 NOTE — Progress Notes (Addendum)
[name] Progress Note  Assessment/Plan: 1. Uremia, hyperkalemia - secondary to missed HD and underdialysis; improving with serial HD 2. ESRD -MWF - HD yesterday net UF 2.5 BUN from 141 to 90s; Cr 13 down to 10s K 4.3  3. Anemia - Hgb 8.3 - down from 9.9 upon admission; last outpt Hgb was 22.7 12/21 at which time tsat was 89% and she had just finished a course of Fe. November tsat was 20% and last dose of Mircera was 200 11/30. Plan resume Aranesp 200; check hemocult 4. Secondary hyperparathyroidism - calcitriol 0.75/binders/sensipar 5. HTN/volume - not getting to edw at outpt unit. CXR clear here - may need EDW raised; BP down to 102 with 1 L off - have reduced goal to 3 L - may need to be reduced more- pre HD weight is in bed - he walks around at home - get standing weight POST HD 6. Nutrition - renal diet/vitamin/add nepro 7. Thrombocytopenia - 90s  8. Chronic back pain- PT consulted 9. Maturing left AVF + with fairly significant arm swelling - suprisingly this doesn't seem to bother him much.- placed 1/3 - of note was the last dialysis he attended was 1/2 so on some level this may have contributed to missing some dialysis 8. Goals of Care - palliative care seeing and trying to arrange a family meeting; he wishes to continue HD, just finds the process uncomfortable; dementia contributing to his reasoning in a previously well educated gentleman.   [name] [name] Beeper [phone] 09/17/2015,8:44 AM  LOS: 2 days   Pt seen, examined and agree w A/P as above. Next HD on Friday.  He is stable from renal standpoint at this time.  [name] [name] pager [phone]    cell [phone] 09/17/2015, 11:49 AM    Subjective:   Misses dialysis due to back pain and this is the reason he signs off early. Chairs are small and uncomfortable.  Pillows don't help.    Objective Filed Vitals:   09/17/15 0400 09/17/15 0655  09/17/15 0705 09/17/15 0800  BP: 146/59 128/77 122/77 111/65  Pulse: 80 73 84 87  Temp: 97.9 F (36.6 C) 97.9 F (36.6 C)    TempSrc: Oral Oral    Resp: 18 15 16 15   Height:      Weight: 93.214 kg (205 lb 8 oz) 93.2 kg (205 lb 7.5 oz)    SpO2: 100% 97%     Physical Exam General: NAD alert and oriented Heart: RRR Lungs: no rales Abdomen: soft NT Extremities: no LE left arm signficant swelling 2/2 access placement Dialysis Access:  Left upper AVF maturing + bruit right IJ active  Dialysis Orders: [facility] 3h 88.5kg 2/2.25 bath Heparin none IJ cath (maturing LUA AVF) Rocaltrol 0.75 ug tiw Hb 11.7 no esa, last tfs 89%(s/p course of Fe tsat 20% in November last Mircera 200 11/30   Additional Objective Labs: Basic Metabolic Panel:  Recent Labs Lab 09/16/15 0430 09/16/15 0704 09/17/15 0704  NA 136 136 135  K 6.0* 5.6* 4.3  CL 98* 101 99*  CO2 20* 19* 22  GLUCOSE 135* 138* 130*  BUN 140* 141* 97*  CREATININE 13.98* 13.78* 10.86*  CALCIUM 7.8* 7.6* 8.2*  PHOS  --  7.0* 6.3*   Liver Function Tests:  Recent Labs Lab 09/10/15 1129 09/15/15 1702 09/16/15 0430 09/16/15 0704 09/17/15 0704  AST 15 16 15   --   --   ALT 11*  10* 9*  --   --   ALKPHOS 41 49 44  --   --   BILITOT 0.7 0.6 0.5  --   --   PROT 6.4* 6.4* 6.1*  --   --   ALBUMIN 3.5 3.5 3.4* 3.2* 3.1*   CBC:  Recent Labs Lab 09/10/15 1129 09/15/15 1702  09/16/15 0430 09/16/15 0704 09/17/15 0704  WBC 4.4 5.7  --  5.7 5.9 3.8*  NEUTROABS 2.7 3.8  --  3.3  --   --   HGB 9.9* 8.5*  < > 9.0* 8.5* 8.3*  HCT 30.4* 26.1*  < > 26.7* 25.1* 24.8*  MCV 97.4 96.3  --  95.7 95.1 92.9  PLT 51* 95*  --  99* 92* 90*  < > = values in this interval not displayed. CBG:  Recent Labs Lab 09/15/15 2342 09/16/15 1114 09/16/15 1621 09/16/15 2052  GLUCAP 95 130* 215* 206*    Studies/Results: Ct Head Wo Contrast  09/15/2015  CLINICAL DATA:  Acute onset confusion EXAM: CT HEAD WITHOUT CONTRAST TECHNIQUE:  Contiguous axial images were obtained from the base of the skull through the vertex without intravenous contrast. COMPARISON:  September 05, 2015 FINDINGS: There is mild diffuse atrophy. There is no intracranial mass, hemorrhage, extra-axial fluid collection, or midline shift. There is small vessel disease throughout the centra semiovale bilaterally. There is no new gray-white compartment lesion. No acute infarct evident. Basal ganglia calcifications probably physiologic in this age group. The bony calvarium appears intact. The mastoid air cells are clear. No intraorbital lesions are identified. IMPRESSION: Atrophy with stable periventricular small vessel disease. No intracranial mass, hemorrhage, or acute appearing infarct. Electronically Signed   By: Bretta Bang III M.D.   On: 09/15/2015 17:24   Dg Chest Port 1 View  09/15/2015  CLINICAL DATA:  82 year old male with history of confusion. Patient reportedly skipped hemodialysis. EXAM: PORTABLE CHEST 1 VIEW COMPARISON:  Chest x-ray 10/01/2013. FINDINGS: Right internal jugular PermCath with tip terminating at the superior cavoatrial junction. Lung volumes are normal. No consolidative airspace disease. No pleural effusions. No evidence of pulmonary edema. No pneumothorax. No suspicious appearing pulmonary nodule or mass. Heart size is normal. Upper mediastinal contours are within normal limits. Atherosclerosis in the thoracic aorta. IMPRESSION: 1. No radiographic evidence of acute cardiopulmonary disease. 2. Atherosclerosis. Electronically Signed   By: Trudie [name] M.D.   On: 09/15/2015 17:37   Medications:   . amLODipine  10 mg Oral Daily  . calcitRIOL  0.75 mcg Oral QODAY  . calcium acetate  2,001 mg Oral TID WC  . cinacalcet  30 mg Oral Q supper  . darbepoetin (ARANESP) injection - DIALYSIS  200 mcg Intravenous Q Wed-HD  . folic acid  1 mg Oral Daily  . gabapentin  100 mg Oral Daily  . insulin aspart  0-9 Units Subcutaneous TID WC  .  lacosamide  100 mg Oral 2 times per day on Sun Tue Thu Sat  . lacosamide  50 mg Oral Q M,W,F-HD  . multivitamin  1 tablet Oral QHS  . sodium chloride  3 mL Intravenous Q12H

## 2015-09-17 NOTE — NC FL2 (Signed)
[place] MEDICAID FL2 LEVEL OF CARE SCREENING TOOL     IDENTIFICATION  Patient Name: [name] Birthdate: 31-Jan-1932 Sex: male Admission Date (Current Location): 09/15/2015  [facility] and [place] Number:  Producer, television/film/video and Address:  [facility], [address] [place], [place] [address]      Provider Number: [id number]  Attending Physician Name and Address:  Vassie Loll, MD  Relative Name and Phone Number:       Current Level of Care: Hospital Recommended Level of Care: Skilled Nursing Facility Prior Approval Number:    Date Approved/Denied:   PASRR Number:  [id number]   Discharge Plan: SNF    Current Diagnoses: Patient Active Problem List   Diagnosis Date Noted  . Renal failure 09/15/2015  . Uremia 09/15/2015  . Hyperkalemia   . Subdural hematoma (HCC) 07/26/2015  . Acute encephalopathy 06/21/2015  . Pancytopenia (HCC) 06/20/2015  . Bradycardia 06/20/2015  . Word finding difficulty 05/29/2015  . Polyneuropathy (HCC) 05/29/2015  . SDH (subdural hematoma) (HCC) 05/19/2015  . Laceration of scalp 05/19/2015  . Degenerative disc disease, lumbar 08/15/2014  . Seizure disorder (HCC) 10/17/2013  . Urinary incontinence 10/01/2013  . Other pancytopenia (HCC) 10/01/2013  . Failure to thrive in adult 10/01/2013  . ESRD on dialysis (HCC) 09/27/2013  . Secondary hyperparathyroidism, renal (HCC) 09/27/2013  . Gout flare 01/17/2013  . Anemia in chronic kidney disease 11/30/2012  . Thrombocytopenia (HCC) 11/30/2012  . Diabetes type 2, controlled (HCC) 11/30/2012  . Hypoglycemia associated with diabetes (HCC) 11/30/2012  . history of AVM (arteriovenous malformation) of colon with hemorrhage 11/29/2012  . Acute GI bleeding 10/02/2012  . Hypertension 10/02/2012    Orientation RESPIRATION BLADDER Height & Weight    Self, Time, Situation, Place  Normal Continent, Indwelling catheter 6' (182.9 cm) 198 lbs.  BEHAVIORAL SYMPTOMS/MOOD  NEUROLOGICAL BOWEL NUTRITION STATUS    Convulsions/Seizures Continent Diet (Renal / Carb Modified - Thin Liquids)  AMBULATORY STATUS COMMUNICATION OF NEEDS Skin   Extensive Assist Verbally Surgical wounds (Left Arm Incision)                       Personal Care Assistance Level of Assistance  Bathing, Feeding, Dressing Bathing Assistance: Limited assistance Feeding assistance: Independent Dressing Assistance: Limited assistance     Functional Limitations Info  Sight, Hearing, Speech Sight Info: Adequate Hearing Info: Adequate Speech Info: Adequate    SPECIAL CARE FACTORS FREQUENCY  PT (By licensed PT), OT (By licensed OT)     PT Frequency: 2 OT Frequency: 2            Contractures Contractures Info: Not present    Additional Factors Info  Code Status, Allergies, Insulin Sliding Scale Code Status Info: DNR Allergies Info: Aspirin, Lactose Intolerance (Gi)   Insulin Sliding Scale Info: Novolog 3 times daily with meals       Current Medications (09/17/2015):  This is the current hospital active medication list Current Facility-Administered Medications  Medication Dose Route Frequency Provider Last Rate Last Dose  . acetaminophen (TYLENOL) tablet 650 mg  650 mg Oral Q6H PRN Eduard Clos, MD       Or  . acetaminophen (TYLENOL) suppository 650 mg  650 mg Rectal Q6H PRN Eduard Clos, MD      . amLODipine (NORVASC) tablet 10 mg  10 mg Oral Daily Eduard Clos, MD   10 mg at 09/17/15 1333  . calcitRIOL (ROCALTROL) capsule 0.75 mcg  0.75  mcg Oral Alric Ran, MD   0.75 mcg at 09/17/15 0933  . calcium acetate (PHOSLO) capsule 2,001 mg  2,001 mg Oral TID WC Beryle Lathe, MD   2,001 mg at 09/17/15 1333  . cinacalcet (SENSIPAR) tablet 30 mg  30 mg Oral Q supper Beryle Lathe, MD   30 mg at 09/16/15 1653  . colchicine tablet 0.6 mg  0.6 mg Oral Daily PRN Eduard Clos, MD      . Darbepoetin Alfa (ARANESP) injection 200 mcg  200 mcg  Intravenous Q Wed-HD Beryle Lathe, MD   200 mcg at 09/17/15 0934  . feeding supplement (NEPRO CARB STEADY) liquid 237 mL  237 mL Oral BID BM Weston Settle, PA-C   237 mL at 09/17/15 1400  . folic acid (FOLVITE) tablet 1 mg  1 mg Oral Daily Eduard Clos, MD   1 mg at 09/17/15 1333  . gabapentin (NEURONTIN) capsule 100 mg  100 mg Oral Daily Eduard Clos, MD   100 mg at 09/17/15 1333  . insulin aspart (novoLOG) injection 0-9 Units  0-9 Units Subcutaneous TID WC Eduard Clos, MD   2 Units at 09/17/15 1200  . lacosamide (VIMPAT) tablet 100 mg  100 mg Oral 2 times per day on Sun Tue Thu Sat Eduard Clos, MD   100 mg at 09/16/15 2206  . lacosamide (VIMPAT) tablet 50 mg  50 mg Oral Q M,W,F-HD Eduard Clos, MD   50 mg at 09/17/15 1333  . multivitamin (RENA-VIT) tablet 1 tablet  1 tablet Oral QHS Beryle Lathe, MD   1 tablet at 09/16/15 2206  . ondansetron (ZOFRAN) tablet 4 mg  4 mg Oral Q6H PRN Eduard Clos, MD       Or  . ondansetron [facility]) injection 4 mg  4 mg Intravenous Q6H PRN Eduard Clos, MD      . sodium chloride 0.9 % injection 3 mL  3 mL Intravenous Q12H Eduard Clos, MD   3 mL at 09/16/15 2206   Facility-Administered Medications Ordered in Other Encounters  Medication Dose Route Frequency Provider Last Rate Last Dose  . Chlorhexidine Gluconate Cloth 2 % PADS 6 each  6 each Topical Once Sherren Kerns, MD         Discharge Medications: Please see discharge summary for a list of discharge medications.  Relevant Imaging Results:  Relevant Lab Results:   Additional Information SSN [id number]  ESRD - Dialysis Holli [place], [place] [phone]

## 2015-09-17 NOTE — Progress Notes (Signed)
[facility] PROGRESS NOTE  [name] AVW:[id number] DOB: July 03, 1932 DOA: 09/15/2015 PCP: [name], DO  Summary 82 yo male with ESRD presented with uremia, bradycardia, hyperkalemia and urinary retention (still makes urine). Missed multiple HD sessions.  Assessment/Plan: Uremia: resolved after hemodialysis   Bradycardia resolved after HD -No chest pain  -Electrolytes within normal limits  -Telemetry demonstrated normal sinus rhythm  -If stable will discontinue telemetry in a.m.   Hyperkalemia: s/p HD -Potassium is now corrected -Will follow electrolytes  Noncompliance: Palliative consulted per nephrology recs -Patient have chosen to continue hemodialysis therapy -Family is planning to be actively involved and make patient accountable with his treatments  Anemia in chronic kidney disease: IV iodine and Aranesp as per renal service description  -No signs of overt bleeding   ESRD on dialysis [facility]): Patient regular schedule Monday, Wednesday, Friday. Has history of noncompliance and signing off earlier from hemodialysis treatment.  -Renal service is on board and helping with inpatient hemodialysis.   History of Seizure disorder [facility]): No seizure activity appreciated during hospitalization -Continue Vimpat and neurontin  Urinary retention:  -We will obtain voiding trials on 1/12  -Patient denies dysuria   Physical deconditioning: Patient will require skilled nursing facility for rehabilitation   Code Status:  full Family Communication:  No family at bedside Disposition Plan:  Per physical therapy patient will require skilled nursing facility at discharge for rehabilitation.  Consultants:  Nephrology  palliative  Procedures:   See below for x-ray reports  Antibiotics:  None  HPI/Subjective: Feels much better. Afebrile. Denies chest pain and shortness of breath. Patient is oriented 2 and currently wishing to continue hemodialysis  treatment.  Objective: Filed Vitals:   09/17/15 1038 09/17/15 1651  BP: 124/56 109/54  Pulse: 91 88  Temp: 97.8 F (36.6 C) 98.8 F (37.1 C)  Resp: 14 16    Intake/Output Summary (Last 24 hours) at 09/17/15 1907 Last data filed at 09/17/15 1830  Gross per 24 hour  Intake    580 ml  Output   3400 ml  Net  -2820 ml   Filed Weights   09/17/15 0400 09/17/15 0655 09/17/15 1038  Weight: 93.214 kg (205 lb 8 oz) 93.2 kg (205 lb 7.5 oz) 90.1 kg (198 lb 10.2 oz)   Tele: NSR  Exam:  General:  Awake and oriented 2. Afebrile, denies chest pain, nausea/vomiting. Patient is status post hemodialysis (well-tolerated). Able to follow commands properly.   Cardiovascular: RRR without MGR  Respiratory: CTA without WRR  Abdomen: S, NT, ND  Ext: no CCE  Basic Metabolic Panel:  Recent Labs Lab 09/15/15 1702 09/15/15 1705 09/16/15 0430 09/16/15 0704 09/17/15 0704  NA 130* 131* 136 136 135  K 6.4* 6.3* 6.0* 5.6* 4.3  CL 95* 98* 98* 101 99*  CO2 17*  --  20* 19* 22  GLUCOSE 209* 195* 135* 138* 130*  BUN 135* 131* 140* 141* 97*  CREATININE 13.62* 12.70* 13.98* 13.78* 10.86*  CALCIUM 8.0*  --  7.8* 7.6* 8.2*  PHOS  --   --   --  7.0* 6.3*   Liver Function Tests:  Recent Labs Lab 09/15/15 1702 09/16/15 0430 09/16/15 0704 09/17/15 0704  AST 16 15  --   --   ALT 10* 9*  --   --   ALKPHOS 49 44  --   --   BILITOT 0.6 0.5  --   --   PROT 6.4* 6.1*  --   --   ALBUMIN 3.5 3.4* 3.2*  3.1*   CBC:  Recent Labs Lab 09/15/15 1702 09/15/15 1705 09/16/15 0430 09/16/15 0704 09/17/15 0704  WBC 5.7  --  5.7 5.9 3.8*  NEUTROABS 3.8  --  3.3  --   --   HGB 8.5* 9.2* 9.0* 8.5* 8.3*  HCT 26.1* 27.0* 26.7* 25.1* 24.8*  MCV 96.3  --  95.7 95.1 92.9  PLT 95*  --  99* 92* 90*   CBG:  Recent Labs Lab 09/16/15 1114 09/16/15 1621 09/16/15 2052 09/17/15 1147 09/17/15 1648  GLUCAP 130* 215* 206* 170* 182*    Recent Results (from the past 240 hour(s))  MRSA PCR Screening      Status: None   Collection Time: 09/15/15 10:12 PM  Result Value Ref Range Status   MRSA by PCR NEGATIVE NEGATIVE Final    Comment:        The GeneXpert MRSA Assay (FDA approved for NASAL specimens only), is one component of a comprehensive MRSA colonization surveillance program. It is not intended to diagnose MRSA infection nor to guide or monitor treatment for MRSA infections.      Studies: No results found.  Scheduled Meds: . amLODipine  10 mg Oral Daily  . calcitRIOL  0.75 mcg Oral QODAY  . calcium acetate  2,001 mg Oral TID WC  . cinacalcet  30 mg Oral Q supper  . darbepoetin (ARANESP) injection - DIALYSIS  200 mcg Intravenous Q Wed-HD  . feeding supplement (NEPRO CARB STEADY)  237 mL Oral BID BM  . folic acid  1 mg Oral Daily  . gabapentin  100 mg Oral Daily  . insulin aspart  0-9 Units Subcutaneous TID WC  . lacosamide  100 mg Oral 2 times per day on Sun Tue Thu Sat  . lacosamide  50 mg Oral Q M,W,F-HD  . multivitamin  1 tablet Oral QHS  . sodium chloride  3 mL Intravenous Q12H   Continuous Infusions:   Time spent: 25 minutes  Vassie Loll MD [phone]   [facility]  [email or web address], password [facility] 09/17/2015, 7:07 PM  LOS: 2 days

## 2015-09-18 ENCOUNTER — Ambulatory Visit: Payer: Medicare Other | Admitting: Internal Medicine

## 2015-09-18 DIAGNOSIS — G9340 Encephalopathy, unspecified: Secondary | ICD-10-CM

## 2015-09-18 LAB — GLUCOSE, CAPILLARY
Glucose-Capillary: 137 mg/dL — ABNORMAL HIGH (ref 65–99)
Glucose-Capillary: 158 mg/dL — ABNORMAL HIGH (ref 65–99)
Glucose-Capillary: 190 mg/dL — ABNORMAL HIGH (ref 65–99)

## 2015-09-18 MED ORDER — DOCUSATE SODIUM 100 MG PO CAPS
100.0000 mg | ORAL_CAPSULE | Freq: Two times a day (BID) | ORAL | Status: DC
  Administered 2015-09-18: 100 mg via ORAL
  Filled 2015-09-18: qty 1

## 2015-09-18 MED ORDER — LIDOCAINE 5 % EX PTCH: 1.0000 | MEDICATED_PATCH | CUTANEOUS | Status: DC

## 2015-09-18 MED ORDER — DOCUSATE SODIUM 100 MG PO CAPS: 100.0000 mg | ORAL_CAPSULE | Freq: Two times a day (BID) | ORAL | Status: DC

## 2015-09-18 MED ORDER — POLYETHYLENE GLYCOL 3350 17 G PO PACK
17.0000 g | PACK | Freq: Every day | ORAL | Status: DC
  Administered 2015-09-18: 17 g via ORAL
  Filled 2015-09-18: qty 1

## 2015-09-18 MED ORDER — CINACALCET HCL 30 MG PO TABS: 30.0000 mg | ORAL_TABLET | Freq: Every day | ORAL | Status: DC

## 2015-09-18 MED ORDER — NEPRO/CARBSTEADY PO LIQD: 237.0000 mL | Freq: Two times a day (BID) | ORAL | Status: DC

## 2015-09-18 MED ORDER — LIDOCAINE 5 % EX PTCH
1.0000 | MEDICATED_PATCH | CUTANEOUS | Status: DC
  Administered 2015-09-18: 1 via TRANSDERMAL
  Filled 2015-09-18: qty 1

## 2015-09-18 NOTE — Progress Notes (Signed)
[name] Progress Note  Assessment/Plan:  1. Uremia, hyperkalemia - secondary to missed HD and underdialysis; improving with serial HD; discussed need for regular attendance and running full tmt -  2. ESRD -MWF - HD serial HD - 2.5 off Tues and Wed- tolerated HD fine in bed - more comfortable- UF low volumes -post HD wt 90.1 - ^ EDW for d/c to 90.5 3. Anemia - Hgb 8.3 - down from 9.9 upon admission; last outpt Hgb was 22.7 12/21 at which time tsat was 89% and she had just finished a course of Fe. November tsat was 20% and last dose of Mircera was 200 11/30. Plan resume Aranesp 200; check hemocult 4. Secondary hyperparathyroidism - calcitriol 0.75/binders/sensipar 5. HTN/volume - not getting to edw at outpt unit. CXR clear here - may need EDW raised; BP down to 102 with 1 L off - have reduced goal to 3 L - may need to be reduced more- pre HD weight is in bed - he walks around at home - get standing weight POST HD 6. Nutrition - renal diet/vitamin/add nepro 7. Thrombocytopenia - 90s  8. Chronic back pain- PT consulted 9. Maturing left AVF + with fairly significant arm swelling - suprisingly this doesn't seem to bother him much.- placed 1/3 - of note was the last dialysis he attended was 1/2 so on some level this may have contributed to missing some dialysis 8. Goals of Care - palliative care has seen; DNR- pt declines SNF- prob d/c today - HD orders written for am just in case he is not d/c  [name], PA-C [facility] Beeper [phone] 09/18/2015,11:54 AM  LOS: 3 days   Pt seen, examined and agree w A/P as above. Ready for DC after HD today.  Andrew Moselle MD [facility] pager [phone]    cell [phone] 09/18/2015, 3:01 PM    Subjective:   Many complaints about outpt HD - focusing on things that aren't directly HD related - special shoes, special chairs  Objective Filed Vitals:   09/17/15 1038 09/17/15 1651 09/17/15 2005 09/18/15  0852  BP: 124/56 109/54 143/67 120/44  Pulse: 91 88 88   Temp: 97.8 F (36.6 C) 98.8 F (37.1 C) 98.7 F (37.1 C)   TempSrc: Oral Oral Oral   Resp: 14 16 17    Height:      Weight: 90.1 kg (198 lb 10.2 oz)     SpO2: 98% 100% 100%    Physical Exam General: NAD talkative Heart: RRR Lungs: clear  Without rales Abdomen:soft NT Extremities: no LE edema Dialysis Access: left upper AVF + edema - stable right IJ  Dialysis Orders: [facility]WF [facility] 3h 88.5kg 2/2.25 bath Heparin none IJ cath (maturing LUA AVF) Rocaltrol 0.75 ug tiw Hb 11.7 no esa, last tfs 89%(s/p course of Fe tsat 20% in November last Mircera 200 11/30   Additional Objective Labs: Basic Metabolic Panel:  Recent Labs Lab 09/16/15 0430 09/16/15 0704 09/17/15 0704  NA 136 136 135  K 6.0* 5.6* 4.3  CL 98* 101 99*  CO2 20* 19* 22  GLUCOSE 135* 138* 130*  BUN 140* 141* 97*  CREATININE 13.98* 13.78* 10.86*  CALCIUM 7.8* 7.6* 8.2*  PHOS  --  7.0* 6.3*   Liver Function Tests:  Recent Labs Lab 09/15/15 1702 09/16/15 0430 09/16/15 0704 09/17/15 0704  AST 16 15  --   --   ALT 10* 9*  --   --   ALKPHOS 49 44  --   --  BILITOT 0.6 0.5  --   --   PROT 6.4* 6.1*  --   --   ALBUMIN 3.5 3.4* 3.2* 3.1*  CBC:  Recent Labs Lab 09/15/15 1702  09/16/15 0430 09/16/15 0704 09/17/15 0704  WBC 5.7  --  5.7 5.9 3.8*  NEUTROABS 3.8  --  3.3  --   --   HGB 8.5*  < > 9.0* 8.5* 8.3*  HCT 26.1*  < > 26.7* 25.1* 24.8*  MCV 96.3  --  95.7 95.1 92.9  PLT 95*  --  99* 92* 90*  < > = values in this interval not displayed. Blood Culture    Component Value Date/Time   SDES BLOOD LEFT ANTECUBITAL 06/20/2015 2328   SPECREQUEST BOTTLES DRAWN AEROBIC AND ANAEROBIC 3CC  06/20/2015 2328   CULT NO GROWTH 5 DAYS 06/20/2015 2328   REPTSTATUS 06/25/2015 FINAL 06/20/2015 2328   CBG:  Recent Labs Lab 09/16/15 2052 09/17/15 1147 09/17/15 1648 09/17/15 2131 09/18/15 0726  GLUCAP 206* 170* 182* 173* 137*    Medications:   . amLODipine  10 mg Oral Daily  . calcitRIOL  0.75 mcg Oral QODAY  . calcium acetate  2,001 mg Oral TID WC  . cinacalcet  30 mg Oral Q supper  . darbepoetin (ARANESP) injection - DIALYSIS  200 mcg Intravenous Q Wed-HD  . feeding supplement (NEPRO CARB STEADY)  237 mL Oral BID BM  . folic acid  1 mg Oral Daily  . gabapentin  100 mg Oral Daily  . insulin aspart  0-9 Units Subcutaneous TID WC  . lacosamide  100 mg Oral 2 times per day on Sun Tue Thu Sat  . lacosamide  50 mg Oral Q M,W,F-HD  . lidocaine  1 patch Transdermal Q24H  . multivitamin  1 tablet Oral QHS  . sodium chloride  3 mL Intravenous Q12H

## 2015-09-18 NOTE — Consult Note (Signed)
   [facility] CM Inpatient Consult   09/18/2015  Braxen Dobek 1931/10/13 [id number]   Patient evaluated for [facility] Care Management services. Spoke with inpatient RNCM prior to engaging patient. Will not engage at this time as patient may go to SNF. Will engage for [facility] when appropriate.  Raiford Noble, MSN-Ed, RN,BSN [facility] Liaison [phone]

## 2015-09-18 NOTE — Clinical Social Work Note (Signed)
CSW met with patient at bedside to review disposition.  Patient states he is from home with his wife and is active with [place] [facility].  Patient is refusing SNF at this time.  Patient states he has equipment for therapy at home and has his own routine to retain muscle tone and activity.  RNCM notified of patient's refusal.  CSW signing off.  Nonnie Done, LCSW [phone]  [id number]; 2S 15-16 and [place] Licensed Clinical Social Worker

## 2015-09-18 NOTE — Care Management Important Message (Signed)
Important Message  Patient Details  Name: [name] MRN: [id number] Date of Birth: [birth year]   Medicare Important Message Given:  Yes    Kyla Balzarine 09/18/2015, 12:17 PM

## 2015-09-18 NOTE — Discharge Summary (Signed)
Physician Discharge Summary  [name] HAL:[id number] DOB: 04/11/1932 DOA: 09/15/2015  PCP: [name], DO  Admit date: 09/15/2015 Discharge date: 09/18/2015  Time spent: 35 minutes  Recommendations for Outpatient Follow-up:  1. Follow response to lidocaine and tramadol for chronic back pain. Might need further medication adjustments to help controlling pain. 2. Please reassess BMET to follow electrolytes  3. Check CBC to follow Hgb   Discharge Diagnoses:  Active Problems:   history of AVM (arteriovenous malformation) of colon with hemorrhage   Anemia in chronic kidney disease   ESRD on dialysis [facility])   Seizure disorder (HCC)   Bradycardia   Acute encephalopathy   Renal failure   Uremia   Hyperkalemia   Discharge Condition: stable and improved. Discharge home with family care and home health services   Diet recommendation: low sodium heart healthy diet   Filed Weights   09/17/15 0400 09/17/15 0655 09/17/15 1038  Weight: 93.214 kg (205 lb 8 oz) 93.2 kg (205 lb 7.5 oz) 90.1 kg (198 lb 10.2 oz)    History of present illness:  82 y.o. male with history of ESRD on hemodialysis on Monday Wednesday Friday, hypertension, history of bradycardia, seizures, subdural hematoma, anemia and diabetes was brought to the ER the patient had some difficulty urination. Patient states even though he is on dialysis he still urinates 3-4 times a day. He had some difficulty urinating last 2 days. In the ER patient had bladder scan done which showed 400 mL retention and was placed on Foley catheter. Family noticed that patient has been having some confusion. Patient has missed his dialysis on Friday. Patient has history of noncompliance with dialysis. Labs drawn showed potassium 6.3 with EKG showing junctional bradycardia. On call nephrologist Dr. Darrick Penna has been consulted. Patient was given Late 50 g along with calcium gluconate insulin and D50.    Hospital Course:  Encephalopathy/Uremia: resolved  after hemodialysis   Bradycardia resolved after HD -No chest pain  -Electrolytes within normal limits  -Telemetry demonstrated normal sinus rhythm   Hyperkalemia: s/p HD -Potassium is now corrected  Noncompliance: Palliative consulted per nephrology recs -Patient have chosen to continue hemodialysis therapy -Family is planning to be actively involved and make patient accountable with his treatments  Anemia in chronic kidney disease: IV iodine and Aranesp as per renal service description  -No signs of overt bleeding   ESRD on dialysis [facility]): Patient regular schedule Monday, Wednesday, Friday. Has history of noncompliance and signing off earlier from hemodialysis treatment.  -Renal service was on board and helping with inpatient hemodialysis.  -patient tolerated 3-3.5 hours of treatment w/o any problems  History of Seizure disorder (HCC): No seizure activity appreciated during hospitalization -Continue Vimpat and neurontin  Urinary retention:  -voiding trial done on 1/12 -patient bladder scan w/o significant amount of urine -Patient denies dysuria -expressed he urinates 2-3 times per day; especially after having HD treatments  Physical deconditioning: Patient decline rehab at SNF -will arrange [facility] services and social worker  Chronic back pain -continue PRN tramadol -will also add lidocaine patch as recommended by palliative care -will require further assessment and medication adjustment to control pain better  Diabetes Mellitus type 2 on insulin therapy -continue SSI at home   Procedures:  See below for x-ray reports  Consultations:  Renal service   Palliative Care   Discharge Exam: Filed Vitals:   09/17/15 2005 09/18/15 0852  BP: 143/67 120/44  Pulse: 88   Temp: 98.7 F (37.1 C)   Resp: 17  General: Awake and oriented 3. Afebrile, denies chest pain, nausea/vomiting. Patient is status post hemodialysis (well-tolerated). Able to follow commands  properly and answering questions with good insight.   Cardiovascular: RRR without MGR  Respiratory: CTA without WRR  Abdomen: S, NT, ND  Ext: no CCE   Discharge Instructions   Discharge Instructions    Diet - low sodium heart healthy    Complete by:  As directed      Discharge instructions    Complete by:  As directed   Take medications as prescribed Maintain adequate hydration Take medications as prescribed Follow up with PCP in 2 weeks Follow low sodium diet          Current Discharge Medication List    START taking these medications   Details  docusate sodium (COLACE) 100 MG capsule Take 1 capsule (100 mg total) by mouth 2 (two) times daily. Qty: 60 capsule, Refills: 0    lidocaine (LIDODERM) 5 % Place 1 patch onto the skin daily. Remove & Discard patch within 12 hours or as directed by MD Qty: 30 patch, Refills: 0    Nutritional Supplements (FEEDING SUPPLEMENT, NEPRO CARB STEADY,) LIQD Take 237 mLs by mouth 2 (two) times daily between meals.      CONTINUE these medications which have CHANGED   Details  cinacalcet (SENSIPAR) 30 MG tablet Take 1 tablet (30 mg total) by mouth daily with supper.      CONTINUE these medications which have NOT CHANGED   Details  amLODipine (NORVASC) 10 MG tablet Take 10 mg by mouth Daily.     calcium acetate (PHOSLO) 667 MG capsule Take 3 capsules by mouth three times a day Qty: 180 capsule, Refills: 3    colchicine 0.6 MG tablet Take 0.6 mg by mouth as needed. For gout    darbepoetin (ARANESP) 60 MCG/0.3ML SOLN injection Inject 0.3 mLs (60 mcg total) into the vein every Thursday with hemodialysis. Qty: 4.2 mL    folic acid (FOLVITE) 1 MG tablet Take 1 tablet (1 mg total) by mouth daily. Qty: 30 tablet, Refills: 0    gabapentin (NEURONTIN) 100 MG capsule Take 1 capsule (100 mg total) by mouth daily. Qty: 30 capsule, Refills: 0   Associated Diagnoses: Degenerative disc disease, lumbar    HUMALOG KWIKPEN 100 UNIT/ML  KiwkPen Inject 0-10 Units into the skin 3 (three) times daily. Sliding Scale Refills: 0    !! lacosamide (VIMPAT) 50 MG TABS tablet Take 1 tablet (50 mg total) by mouth every Monday, Wednesday, and Friday with hemodialysis. Qty: 15 tablet, Refills: 1    !! Lacosamide 100 MG TABS Take 1 tablet (100 mg total) by mouth 2 (two) times daily. Qty: 60 tablet, Refills: 3   Associated Diagnoses: Secondary seizure disorder (HCC)    sodium chloride 0.9 % SOLN 100 mL with ferric gluconate 12.5 MG/ML SOLN 62.5 mg Inject 62.5 mg into the vein every Thursday with hemodialysis.    calcitRIOL (ROCALTROL) 0.5 MCG capsule Take 2 capsules (1 mcg total) by mouth every Monday, Wednesday, and Friday with hemodialysis.    Diphenhydramine-APAP 25-500 MG TABS Take 1 tablet by mouth at bedtime as needed. For sleep    multivitamin (RENA-VIT) TABS tablet Take 1 tablet by mouth at bedtime. Qty: 30 tablet, Refills: 0    traMADol (ULTRAM) 50 MG tablet Take 1 tablet (50 mg total) by mouth every 6 (six) hours as needed. Qty: 20 tablet, Refills: 0     !! - Potential duplicate medications found. Please  discuss with provider.    STOP taking these medications     divalproex (DEPAKOTE) 500 MG DR tablet        Allergies  Allergen Reactions  . Aspirin Other (See Comments)    Bleeding   . Lactose Intolerance (Gi) Diarrhea   Follow-up Information    Follow up with [name], [name], DO. Schedule an appointment as soon as possible for a visit in 2 weeks.   Specialty:  Geriatric Medicine   Contact information:   [address] [place] [address] [phone]       The results of significant diagnostics from this hospitalization (including imaging, microbiology, ancillary and laboratory) are listed below for reference.    Significant Diagnostic Studies: Ct Head Wo Contrast  09/15/2015  CLINICAL DATA:  Acute onset confusion EXAM: CT HEAD WITHOUT CONTRAST TECHNIQUE: Contiguous axial images were obtained from the  base of the skull through the vertex without intravenous contrast. COMPARISON:  September 05, 2015 FINDINGS: There is mild diffuse atrophy. There is no intracranial mass, hemorrhage, extra-axial fluid collection, or midline shift. There is small vessel disease throughout the centra semiovale bilaterally. There is no new gray-white compartment lesion. No acute infarct evident. Basal ganglia calcifications probably physiologic in this age group. The bony calvarium appears intact. The mastoid air cells are clear. No intraorbital lesions are identified. IMPRESSION: Atrophy with stable periventricular small vessel disease. No intracranial mass, hemorrhage, or acute appearing infarct. Electronically Signed   By: Bretta Bang III M.D.   On: 09/15/2015 17:24   Ct Head Wo Contrast  09/05/2015  CLINICAL DATA:  Recent seizure activity EXAM: CT HEAD WITHOUT CONTRAST TECHNIQUE: Contiguous axial images were obtained from the base of the skull through the vertex without intravenous contrast. COMPARISON:  07/28/2015 FINDINGS: The bony calvarium is intact. No gross soft tissue abnormality is noted. Mild atrophic changes are seen. The previously seen subdural hematoma on the left has resolved in the interval. No significant midline shift is noted. Chronic ischemic changes are seen. No acute infarct, acute hemorrhage or space-occupying mass lesion is noted. IMPRESSION: Resolution of previously seen left-sided subdural hematoma No acute abnormality noted. Electronically Signed   By: Alcide Clever M.D.   On: 09/05/2015 14:05   Dg Chest Port 1 View  09/15/2015  CLINICAL DATA:  82 year old male with history of confusion. Patient reportedly skipped hemodialysis. EXAM: PORTABLE CHEST 1 VIEW COMPARISON:  Chest x-ray 10/01/2013. FINDINGS: Right internal jugular PermCath with tip terminating at the superior cavoatrial junction. Lung volumes are normal. No consolidative airspace disease. No pleural effusions. No evidence of pulmonary  edema. No pneumothorax. No suspicious appearing pulmonary nodule or mass. Heart size is normal. Upper mediastinal contours are within normal limits. Atherosclerosis in the thoracic aorta. IMPRESSION: 1. No radiographic evidence of acute cardiopulmonary disease. 2. Atherosclerosis. Electronically Signed   By: Trudie [name] M.D.   On: 09/15/2015 17:37    Microbiology: Recent Results (from the past 240 hour(s))  MRSA PCR Screening     Status: None   Collection Time: 09/15/15 10:12 PM  Result Value Ref Range Status   MRSA by PCR NEGATIVE NEGATIVE Final    Comment:        The GeneXpert MRSA Assay (FDA approved for NASAL specimens only), is one component of a comprehensive MRSA colonization surveillance program. It is not intended to diagnose MRSA infection nor to guide or monitor treatment for MRSA infections.      Labs: Basic Metabolic Panel:  Recent Labs Lab 09/15/15 1702  09/15/15 1705 09/16/15 0430 09/16/15 0704 09/17/15 0704  NA 130* 131* 136 136 135  K 6.4* 6.3* 6.0* 5.6* 4.3  CL 95* 98* 98* 101 99*  CO2 17*  --  20* 19* 22  GLUCOSE 209* 195* 135* 138* 130*  BUN 135* 131* 140* 141* 97*  CREATININE 13.62* 12.70* 13.98* 13.78* 10.86*  CALCIUM 8.0*  --  7.8* 7.6* 8.2*  PHOS  --   --   --  7.0* 6.3*   Liver Function Tests:  Recent Labs Lab 09/15/15 1702 09/16/15 0430 09/16/15 0704 09/17/15 0704  AST 16 15  --   --   ALT 10* 9*  --   --   ALKPHOS 49 44  --   --   BILITOT 0.6 0.5  --   --   PROT 6.4* 6.1*  --   --   ALBUMIN 3.5 3.4* 3.2* 3.1*   CBC:  Recent Labs Lab 09/15/15 1702 09/15/15 1705 09/16/15 0430 09/16/15 0704 09/17/15 0704  WBC 5.7  --  5.7 5.9 3.8*  NEUTROABS 3.8  --  3.3  --   --   HGB 8.5* 9.2* 9.0* 8.5* 8.3*  HCT 26.1* 27.0* 26.7* 25.1* 24.8*  MCV 96.3  --  95.7 95.1 92.9  PLT 95*  --  99* 92* 90*   CBG:  Recent Labs Lab 09/17/15 1147 09/17/15 1648 09/17/15 2131 09/18/15 0726 09/18/15 1141  GLUCAP 170* 182* 173* 137* 158*     Signed:  Vassie Loll MD.  [facility] 09/18/2015, 2:43 PM

## 2015-09-19 DIAGNOSIS — D689 Coagulation defect, unspecified: Secondary | ICD-10-CM | POA: Diagnosis not present

## 2015-09-19 DIAGNOSIS — E1129 Type 2 diabetes mellitus with other diabetic kidney complication: Secondary | ICD-10-CM | POA: Diagnosis not present

## 2015-09-19 DIAGNOSIS — D631 Anemia in chronic kidney disease: Secondary | ICD-10-CM | POA: Diagnosis not present

## 2015-09-19 DIAGNOSIS — N186 End stage renal disease: Secondary | ICD-10-CM | POA: Diagnosis not present

## 2015-09-19 NOTE — Patient Outreach (Signed)
   Received notification that patient went home as he declined SNF placement. Attempted to reach patient by phone on both home and cell numbers listed in EPIC to discuss and offer [facility] Care Management. However, unable to leave message on home phone and confidential voicemail was left on cell number. Call made to [facility], Lafonda Mosses. She reports that patient is followed by [facility] Case Management program telephonically. Will not further attempt to engage for [facility] as patient is enrolled in a Care Management program currently.    Raiford Noble, MSN-Ed, RN,BSN [facility] Liaison [phone]

## 2015-09-20 DIAGNOSIS — I120 Hypertensive chronic kidney disease with stage 5 chronic kidney disease or end stage renal disease: Secondary | ICD-10-CM | POA: Diagnosis not present

## 2015-09-20 DIAGNOSIS — S065X0S Traumatic subdural hemorrhage without loss of consciousness, sequela: Secondary | ICD-10-CM | POA: Diagnosis not present

## 2015-09-20 DIAGNOSIS — E1122 Type 2 diabetes mellitus with diabetic chronic kidney disease: Secondary | ICD-10-CM | POA: Diagnosis not present

## 2015-09-20 DIAGNOSIS — G40909 Epilepsy, unspecified, not intractable, without status epilepticus: Secondary | ICD-10-CM | POA: Diagnosis not present

## 2015-09-22 DIAGNOSIS — D689 Coagulation defect, unspecified: Secondary | ICD-10-CM | POA: Diagnosis not present

## 2015-09-22 DIAGNOSIS — E1129 Type 2 diabetes mellitus with other diabetic kidney complication: Secondary | ICD-10-CM | POA: Diagnosis not present

## 2015-09-22 DIAGNOSIS — D631 Anemia in chronic kidney disease: Secondary | ICD-10-CM | POA: Diagnosis not present

## 2015-09-22 DIAGNOSIS — I120 Hypertensive chronic kidney disease with stage 5 chronic kidney disease or end stage renal disease: Secondary | ICD-10-CM | POA: Diagnosis not present

## 2015-09-22 DIAGNOSIS — E1122 Type 2 diabetes mellitus with diabetic chronic kidney disease: Secondary | ICD-10-CM | POA: Diagnosis not present

## 2015-09-22 DIAGNOSIS — G40909 Epilepsy, unspecified, not intractable, without status epilepticus: Secondary | ICD-10-CM | POA: Diagnosis not present

## 2015-09-22 DIAGNOSIS — N186 End stage renal disease: Secondary | ICD-10-CM | POA: Diagnosis not present

## 2015-09-22 DIAGNOSIS — S065X0S Traumatic subdural hemorrhage without loss of consciousness, sequela: Secondary | ICD-10-CM | POA: Diagnosis not present

## 2015-09-23 DIAGNOSIS — G40909 Epilepsy, unspecified, not intractable, without status epilepticus: Secondary | ICD-10-CM | POA: Diagnosis not present

## 2015-09-23 DIAGNOSIS — I120 Hypertensive chronic kidney disease with stage 5 chronic kidney disease or end stage renal disease: Secondary | ICD-10-CM | POA: Diagnosis not present

## 2015-09-23 DIAGNOSIS — S065X0S Traumatic subdural hemorrhage without loss of consciousness, sequela: Secondary | ICD-10-CM | POA: Diagnosis not present

## 2015-09-23 DIAGNOSIS — E1122 Type 2 diabetes mellitus with diabetic chronic kidney disease: Secondary | ICD-10-CM | POA: Diagnosis not present

## 2015-09-24 DIAGNOSIS — D689 Coagulation defect, unspecified: Secondary | ICD-10-CM | POA: Diagnosis not present

## 2015-09-24 DIAGNOSIS — E1129 Type 2 diabetes mellitus with other diabetic kidney complication: Secondary | ICD-10-CM | POA: Diagnosis not present

## 2015-09-24 DIAGNOSIS — D631 Anemia in chronic kidney disease: Secondary | ICD-10-CM | POA: Diagnosis not present

## 2015-09-24 DIAGNOSIS — N186 End stage renal disease: Secondary | ICD-10-CM | POA: Diagnosis not present

## 2015-09-25 DIAGNOSIS — S065X0S Traumatic subdural hemorrhage without loss of consciousness, sequela: Secondary | ICD-10-CM | POA: Diagnosis not present

## 2015-09-25 DIAGNOSIS — E1122 Type 2 diabetes mellitus with diabetic chronic kidney disease: Secondary | ICD-10-CM | POA: Diagnosis not present

## 2015-09-25 DIAGNOSIS — I120 Hypertensive chronic kidney disease with stage 5 chronic kidney disease or end stage renal disease: Secondary | ICD-10-CM | POA: Diagnosis not present

## 2015-09-25 DIAGNOSIS — G40909 Epilepsy, unspecified, not intractable, without status epilepticus: Secondary | ICD-10-CM | POA: Diagnosis not present

## 2015-09-26 ENCOUNTER — Other Ambulatory Visit: Payer: Self-pay | Admitting: *Deleted

## 2015-09-26 DIAGNOSIS — M5136 Other intervertebral disc degeneration, lumbar region: Secondary | ICD-10-CM

## 2015-09-26 DIAGNOSIS — E1129 Type 2 diabetes mellitus with other diabetic kidney complication: Secondary | ICD-10-CM | POA: Diagnosis not present

## 2015-09-26 DIAGNOSIS — N186 End stage renal disease: Secondary | ICD-10-CM | POA: Diagnosis not present

## 2015-09-26 DIAGNOSIS — D689 Coagulation defect, unspecified: Secondary | ICD-10-CM | POA: Diagnosis not present

## 2015-09-26 DIAGNOSIS — D631 Anemia in chronic kidney disease: Secondary | ICD-10-CM | POA: Diagnosis not present

## 2015-09-29 ENCOUNTER — Other Ambulatory Visit: Payer: Self-pay | Admitting: *Deleted

## 2015-09-29 DIAGNOSIS — E1129 Type 2 diabetes mellitus with other diabetic kidney complication: Secondary | ICD-10-CM | POA: Diagnosis not present

## 2015-09-29 DIAGNOSIS — D689 Coagulation defect, unspecified: Secondary | ICD-10-CM | POA: Diagnosis not present

## 2015-09-29 DIAGNOSIS — N186 End stage renal disease: Secondary | ICD-10-CM | POA: Diagnosis not present

## 2015-09-29 DIAGNOSIS — D631 Anemia in chronic kidney disease: Secondary | ICD-10-CM | POA: Diagnosis not present

## 2015-09-29 MED ORDER — TRAMADOL HCL 50 MG PO TABS: ORAL_TABLET | ORAL | Status: DC

## 2015-09-29 MED ORDER — COLCHICINE 0.6 MG PO TABS: 0.6000 mg | ORAL_TABLET | ORAL | Status: DC | PRN

## 2015-09-29 NOTE — Telephone Encounter (Signed)
[facility] [employer]. Patient needs an appointment before anymore refills.

## 2015-09-30 DIAGNOSIS — I120 Hypertensive chronic kidney disease with stage 5 chronic kidney disease or end stage renal disease: Secondary | ICD-10-CM | POA: Diagnosis not present

## 2015-09-30 DIAGNOSIS — E1122 Type 2 diabetes mellitus with diabetic chronic kidney disease: Secondary | ICD-10-CM | POA: Diagnosis not present

## 2015-09-30 DIAGNOSIS — S065X0S Traumatic subdural hemorrhage without loss of consciousness, sequela: Secondary | ICD-10-CM | POA: Diagnosis not present

## 2015-09-30 DIAGNOSIS — G40909 Epilepsy, unspecified, not intractable, without status epilepticus: Secondary | ICD-10-CM | POA: Diagnosis not present

## 2015-10-01 DIAGNOSIS — D631 Anemia in chronic kidney disease: Secondary | ICD-10-CM | POA: Diagnosis not present

## 2015-10-01 DIAGNOSIS — D689 Coagulation defect, unspecified: Secondary | ICD-10-CM | POA: Diagnosis not present

## 2015-10-01 DIAGNOSIS — E1129 Type 2 diabetes mellitus with other diabetic kidney complication: Secondary | ICD-10-CM | POA: Diagnosis not present

## 2015-10-01 DIAGNOSIS — N186 End stage renal disease: Secondary | ICD-10-CM | POA: Diagnosis not present

## 2015-10-02 DIAGNOSIS — E1122 Type 2 diabetes mellitus with diabetic chronic kidney disease: Secondary | ICD-10-CM | POA: Diagnosis not present

## 2015-10-02 DIAGNOSIS — S065X0S Traumatic subdural hemorrhage without loss of consciousness, sequela: Secondary | ICD-10-CM | POA: Diagnosis not present

## 2015-10-02 DIAGNOSIS — I120 Hypertensive chronic kidney disease with stage 5 chronic kidney disease or end stage renal disease: Secondary | ICD-10-CM | POA: Diagnosis not present

## 2015-10-02 DIAGNOSIS — G40909 Epilepsy, unspecified, not intractable, without status epilepticus: Secondary | ICD-10-CM | POA: Diagnosis not present

## 2015-10-03 DIAGNOSIS — N186 End stage renal disease: Secondary | ICD-10-CM | POA: Diagnosis not present

## 2015-10-03 DIAGNOSIS — D689 Coagulation defect, unspecified: Secondary | ICD-10-CM | POA: Diagnosis not present

## 2015-10-03 DIAGNOSIS — D631 Anemia in chronic kidney disease: Secondary | ICD-10-CM | POA: Diagnosis not present

## 2015-10-03 DIAGNOSIS — E1129 Type 2 diabetes mellitus with other diabetic kidney complication: Secondary | ICD-10-CM | POA: Diagnosis not present

## 2015-10-04 DIAGNOSIS — N186 End stage renal disease: Secondary | ICD-10-CM | POA: Diagnosis not present

## 2015-10-04 DIAGNOSIS — M1990 Unspecified osteoarthritis, unspecified site: Secondary | ICD-10-CM | POA: Diagnosis not present

## 2015-10-04 DIAGNOSIS — E1122 Type 2 diabetes mellitus with diabetic chronic kidney disease: Secondary | ICD-10-CM | POA: Diagnosis not present

## 2015-10-04 DIAGNOSIS — M545 Low back pain: Secondary | ICD-10-CM | POA: Diagnosis not present

## 2015-10-04 DIAGNOSIS — G40909 Epilepsy, unspecified, not intractable, without status epilepticus: Secondary | ICD-10-CM | POA: Diagnosis not present

## 2015-10-04 DIAGNOSIS — I120 Hypertensive chronic kidney disease with stage 5 chronic kidney disease or end stage renal disease: Secondary | ICD-10-CM | POA: Diagnosis not present

## 2015-10-06 DIAGNOSIS — D689 Coagulation defect, unspecified: Secondary | ICD-10-CM | POA: Diagnosis not present

## 2015-10-06 DIAGNOSIS — D631 Anemia in chronic kidney disease: Secondary | ICD-10-CM | POA: Diagnosis not present

## 2015-10-06 DIAGNOSIS — N186 End stage renal disease: Secondary | ICD-10-CM | POA: Diagnosis not present

## 2015-10-06 DIAGNOSIS — E1129 Type 2 diabetes mellitus with other diabetic kidney complication: Secondary | ICD-10-CM | POA: Diagnosis not present

## 2015-10-07 DIAGNOSIS — E1122 Type 2 diabetes mellitus with diabetic chronic kidney disease: Secondary | ICD-10-CM | POA: Diagnosis not present

## 2015-10-07 DIAGNOSIS — Z992 Dependence on renal dialysis: Secondary | ICD-10-CM | POA: Diagnosis not present

## 2015-10-07 DIAGNOSIS — N186 End stage renal disease: Secondary | ICD-10-CM | POA: Diagnosis not present

## 2015-10-07 DIAGNOSIS — M545 Low back pain: Secondary | ICD-10-CM | POA: Diagnosis not present

## 2015-10-07 DIAGNOSIS — I120 Hypertensive chronic kidney disease with stage 5 chronic kidney disease or end stage renal disease: Secondary | ICD-10-CM | POA: Diagnosis not present

## 2015-10-08 DIAGNOSIS — D631 Anemia in chronic kidney disease: Secondary | ICD-10-CM | POA: Diagnosis not present

## 2015-10-08 DIAGNOSIS — A4152 Sepsis due to Pseudomonas: Secondary | ICD-10-CM | POA: Diagnosis not present

## 2015-10-08 DIAGNOSIS — N186 End stage renal disease: Secondary | ICD-10-CM | POA: Diagnosis not present

## 2015-10-08 DIAGNOSIS — D689 Coagulation defect, unspecified: Secondary | ICD-10-CM | POA: Diagnosis not present

## 2015-10-08 DIAGNOSIS — E1129 Type 2 diabetes mellitus with other diabetic kidney complication: Secondary | ICD-10-CM | POA: Diagnosis not present

## 2015-10-08 DIAGNOSIS — D509 Iron deficiency anemia, unspecified: Secondary | ICD-10-CM | POA: Diagnosis not present

## 2015-10-09 ENCOUNTER — Encounter (INDEPENDENT_AMBULATORY_CARE_PROVIDER_SITE_OTHER): Payer: Medicare Other | Admitting: Ophthalmology

## 2015-10-09 DIAGNOSIS — I120 Hypertensive chronic kidney disease with stage 5 chronic kidney disease or end stage renal disease: Secondary | ICD-10-CM | POA: Diagnosis not present

## 2015-10-09 DIAGNOSIS — N186 End stage renal disease: Secondary | ICD-10-CM | POA: Diagnosis not present

## 2015-10-09 DIAGNOSIS — E1122 Type 2 diabetes mellitus with diabetic chronic kidney disease: Secondary | ICD-10-CM | POA: Diagnosis not present

## 2015-10-09 DIAGNOSIS — M545 Low back pain: Secondary | ICD-10-CM | POA: Diagnosis not present

## 2015-10-10 DIAGNOSIS — D509 Iron deficiency anemia, unspecified: Secondary | ICD-10-CM | POA: Diagnosis not present

## 2015-10-10 DIAGNOSIS — A4152 Sepsis due to Pseudomonas: Secondary | ICD-10-CM | POA: Diagnosis not present

## 2015-10-10 DIAGNOSIS — E1129 Type 2 diabetes mellitus with other diabetic kidney complication: Secondary | ICD-10-CM | POA: Diagnosis not present

## 2015-10-10 DIAGNOSIS — D631 Anemia in chronic kidney disease: Secondary | ICD-10-CM | POA: Diagnosis not present

## 2015-10-10 DIAGNOSIS — N186 End stage renal disease: Secondary | ICD-10-CM | POA: Diagnosis not present

## 2015-10-10 DIAGNOSIS — D689 Coagulation defect, unspecified: Secondary | ICD-10-CM | POA: Diagnosis not present

## 2015-10-13 DIAGNOSIS — N186 End stage renal disease: Secondary | ICD-10-CM | POA: Diagnosis not present

## 2015-10-13 DIAGNOSIS — E1129 Type 2 diabetes mellitus with other diabetic kidney complication: Secondary | ICD-10-CM | POA: Diagnosis not present

## 2015-10-13 DIAGNOSIS — D509 Iron deficiency anemia, unspecified: Secondary | ICD-10-CM | POA: Diagnosis not present

## 2015-10-13 DIAGNOSIS — A4152 Sepsis due to Pseudomonas: Secondary | ICD-10-CM | POA: Diagnosis not present

## 2015-10-13 DIAGNOSIS — D631 Anemia in chronic kidney disease: Secondary | ICD-10-CM | POA: Diagnosis not present

## 2015-10-13 DIAGNOSIS — D689 Coagulation defect, unspecified: Secondary | ICD-10-CM | POA: Diagnosis not present

## 2015-10-14 DIAGNOSIS — E1122 Type 2 diabetes mellitus with diabetic chronic kidney disease: Secondary | ICD-10-CM | POA: Diagnosis not present

## 2015-10-14 DIAGNOSIS — N186 End stage renal disease: Secondary | ICD-10-CM | POA: Diagnosis not present

## 2015-10-14 DIAGNOSIS — I120 Hypertensive chronic kidney disease with stage 5 chronic kidney disease or end stage renal disease: Secondary | ICD-10-CM | POA: Diagnosis not present

## 2015-10-14 DIAGNOSIS — M545 Low back pain: Secondary | ICD-10-CM | POA: Diagnosis not present

## 2015-10-15 DIAGNOSIS — D631 Anemia in chronic kidney disease: Secondary | ICD-10-CM | POA: Diagnosis not present

## 2015-10-15 DIAGNOSIS — D509 Iron deficiency anemia, unspecified: Secondary | ICD-10-CM | POA: Diagnosis not present

## 2015-10-15 DIAGNOSIS — A4152 Sepsis due to Pseudomonas: Secondary | ICD-10-CM | POA: Diagnosis not present

## 2015-10-15 DIAGNOSIS — E1122 Type 2 diabetes mellitus with diabetic chronic kidney disease: Secondary | ICD-10-CM | POA: Diagnosis not present

## 2015-10-15 DIAGNOSIS — I120 Hypertensive chronic kidney disease with stage 5 chronic kidney disease or end stage renal disease: Secondary | ICD-10-CM | POA: Diagnosis not present

## 2015-10-15 DIAGNOSIS — D689 Coagulation defect, unspecified: Secondary | ICD-10-CM | POA: Diagnosis not present

## 2015-10-15 DIAGNOSIS — M545 Low back pain: Secondary | ICD-10-CM | POA: Diagnosis not present

## 2015-10-15 DIAGNOSIS — E1129 Type 2 diabetes mellitus with other diabetic kidney complication: Secondary | ICD-10-CM | POA: Diagnosis not present

## 2015-10-15 DIAGNOSIS — N186 End stage renal disease: Secondary | ICD-10-CM | POA: Diagnosis not present

## 2015-10-16 ENCOUNTER — Encounter (INDEPENDENT_AMBULATORY_CARE_PROVIDER_SITE_OTHER): Payer: Medicare Other | Admitting: Ophthalmology

## 2015-10-16 DIAGNOSIS — N186 End stage renal disease: Secondary | ICD-10-CM | POA: Diagnosis not present

## 2015-10-16 DIAGNOSIS — E1122 Type 2 diabetes mellitus with diabetic chronic kidney disease: Secondary | ICD-10-CM | POA: Diagnosis not present

## 2015-10-16 DIAGNOSIS — M545 Low back pain: Secondary | ICD-10-CM | POA: Diagnosis not present

## 2015-10-16 DIAGNOSIS — I120 Hypertensive chronic kidney disease with stage 5 chronic kidney disease or end stage renal disease: Secondary | ICD-10-CM | POA: Diagnosis not present

## 2015-10-17 DIAGNOSIS — D689 Coagulation defect, unspecified: Secondary | ICD-10-CM | POA: Diagnosis not present

## 2015-10-17 DIAGNOSIS — D509 Iron deficiency anemia, unspecified: Secondary | ICD-10-CM | POA: Diagnosis not present

## 2015-10-17 DIAGNOSIS — E1129 Type 2 diabetes mellitus with other diabetic kidney complication: Secondary | ICD-10-CM | POA: Diagnosis not present

## 2015-10-17 DIAGNOSIS — D631 Anemia in chronic kidney disease: Secondary | ICD-10-CM | POA: Diagnosis not present

## 2015-10-17 DIAGNOSIS — N186 End stage renal disease: Secondary | ICD-10-CM | POA: Diagnosis not present

## 2015-10-17 DIAGNOSIS — A4152 Sepsis due to Pseudomonas: Secondary | ICD-10-CM | POA: Diagnosis not present

## 2015-10-20 DIAGNOSIS — N186 End stage renal disease: Secondary | ICD-10-CM | POA: Diagnosis not present

## 2015-10-20 DIAGNOSIS — A4152 Sepsis due to Pseudomonas: Secondary | ICD-10-CM | POA: Diagnosis not present

## 2015-10-20 DIAGNOSIS — D631 Anemia in chronic kidney disease: Secondary | ICD-10-CM | POA: Diagnosis not present

## 2015-10-20 DIAGNOSIS — D689 Coagulation defect, unspecified: Secondary | ICD-10-CM | POA: Diagnosis not present

## 2015-10-20 DIAGNOSIS — D509 Iron deficiency anemia, unspecified: Secondary | ICD-10-CM | POA: Diagnosis not present

## 2015-10-20 DIAGNOSIS — E1129 Type 2 diabetes mellitus with other diabetic kidney complication: Secondary | ICD-10-CM | POA: Diagnosis not present

## 2015-10-21 ENCOUNTER — Encounter (INDEPENDENT_AMBULATORY_CARE_PROVIDER_SITE_OTHER): Payer: Medicare Other | Admitting: Ophthalmology

## 2015-10-21 DIAGNOSIS — M545 Low back pain: Secondary | ICD-10-CM | POA: Diagnosis not present

## 2015-10-21 DIAGNOSIS — E1122 Type 2 diabetes mellitus with diabetic chronic kidney disease: Secondary | ICD-10-CM | POA: Diagnosis not present

## 2015-10-21 DIAGNOSIS — N186 End stage renal disease: Secondary | ICD-10-CM | POA: Diagnosis not present

## 2015-10-21 DIAGNOSIS — I120 Hypertensive chronic kidney disease with stage 5 chronic kidney disease or end stage renal disease: Secondary | ICD-10-CM | POA: Diagnosis not present

## 2015-10-23 ENCOUNTER — Other Ambulatory Visit: Payer: Self-pay

## 2015-10-23 ENCOUNTER — Inpatient Hospital Stay (HOSPITAL_COMMUNITY)
Admission: EM | Admit: 2015-10-23 | Discharge: 2015-10-27 | DRG: 252 | Disposition: A | Discharge status: Home Health | Payer: Medicare Other | Attending: Family Medicine | Admitting: Family Medicine

## 2015-10-23 ENCOUNTER — Emergency Department (HOSPITAL_COMMUNITY): Payer: Medicare Other

## 2015-10-23 ENCOUNTER — Encounter (HOSPITAL_COMMUNITY): Payer: Self-pay | Admitting: *Deleted

## 2015-10-23 ENCOUNTER — Other Ambulatory Visit (HOSPITAL_COMMUNITY): Payer: Self-pay

## 2015-10-23 DIAGNOSIS — A4150 Gram-negative sepsis, unspecified: Secondary | ICD-10-CM | POA: Diagnosis present

## 2015-10-23 DIAGNOSIS — T827XXA Infection and inflammatory reaction due to other cardiac and vascular devices, implants and grafts, initial encounter: Secondary | ICD-10-CM | POA: Diagnosis not present

## 2015-10-23 DIAGNOSIS — Z79899 Other long term (current) drug therapy: Secondary | ICD-10-CM

## 2015-10-23 DIAGNOSIS — I120 Hypertensive chronic kidney disease with stage 5 chronic kidney disease or end stage renal disease: Secondary | ICD-10-CM | POA: Diagnosis present

## 2015-10-23 DIAGNOSIS — R4182 Altered mental status, unspecified: Secondary | ICD-10-CM | POA: Diagnosis not present

## 2015-10-23 DIAGNOSIS — Z66 Do not resuscitate: Secondary | ICD-10-CM | POA: Diagnosis not present

## 2015-10-23 DIAGNOSIS — Y838 Other surgical procedures as the cause of abnormal reaction of the patient, or of later complication, without mention of misadventure at the time of the procedure: Secondary | ICD-10-CM | POA: Diagnosis present

## 2015-10-23 DIAGNOSIS — R627 Adult failure to thrive: Secondary | ICD-10-CM | POA: Diagnosis not present

## 2015-10-23 DIAGNOSIS — Z886 Allergy status to analgesic agent status: Secondary | ICD-10-CM | POA: Diagnosis not present

## 2015-10-23 DIAGNOSIS — Z794 Long term (current) use of insulin: Secondary | ICD-10-CM | POA: Diagnosis not present

## 2015-10-23 DIAGNOSIS — G9340 Encephalopathy, unspecified: Secondary | ICD-10-CM | POA: Diagnosis present

## 2015-10-23 DIAGNOSIS — G8929 Other chronic pain: Secondary | ICD-10-CM | POA: Diagnosis not present

## 2015-10-23 DIAGNOSIS — Z992 Dependence on renal dialysis: Secondary | ICD-10-CM

## 2015-10-23 DIAGNOSIS — Z7189 Other specified counseling: Secondary | ICD-10-CM | POA: Diagnosis not present

## 2015-10-23 DIAGNOSIS — E8889 Other specified metabolic disorders: Secondary | ICD-10-CM | POA: Diagnosis present

## 2015-10-23 DIAGNOSIS — F0390 Unspecified dementia without behavioral disturbance: Secondary | ICD-10-CM | POA: Diagnosis not present

## 2015-10-23 DIAGNOSIS — E1140 Type 2 diabetes mellitus with diabetic neuropathy, unspecified: Secondary | ICD-10-CM | POA: Diagnosis present

## 2015-10-23 DIAGNOSIS — D696 Thrombocytopenia, unspecified: Secondary | ICD-10-CM | POA: Diagnosis present

## 2015-10-23 DIAGNOSIS — I272 Other secondary pulmonary hypertension: Secondary | ICD-10-CM | POA: Diagnosis not present

## 2015-10-23 DIAGNOSIS — G4730 Sleep apnea, unspecified: Secondary | ICD-10-CM | POA: Diagnosis present

## 2015-10-23 DIAGNOSIS — N189 Chronic kidney disease, unspecified: Secondary | ICD-10-CM

## 2015-10-23 DIAGNOSIS — I440 Atrioventricular block, first degree: Secondary | ICD-10-CM | POA: Diagnosis not present

## 2015-10-23 DIAGNOSIS — R402421 Glasgow coma scale score 9-12, in the field [EMT or ambulance]: Secondary | ICD-10-CM | POA: Diagnosis not present

## 2015-10-23 DIAGNOSIS — N186 End stage renal disease: Secondary | ICD-10-CM | POA: Diagnosis not present

## 2015-10-23 DIAGNOSIS — D631 Anemia in chronic kidney disease: Secondary | ICD-10-CM | POA: Diagnosis present

## 2015-10-23 DIAGNOSIS — Z515 Encounter for palliative care: Secondary | ICD-10-CM | POA: Diagnosis not present

## 2015-10-23 DIAGNOSIS — G9341 Metabolic encephalopathy: Secondary | ICD-10-CM | POA: Diagnosis present

## 2015-10-23 DIAGNOSIS — I10 Essential (primary) hypertension: Secondary | ICD-10-CM | POA: Diagnosis present

## 2015-10-23 DIAGNOSIS — E1122 Type 2 diabetes mellitus with diabetic chronic kidney disease: Secondary | ICD-10-CM | POA: Diagnosis present

## 2015-10-23 DIAGNOSIS — I5030 Unspecified diastolic (congestive) heart failure: Secondary | ICD-10-CM | POA: Diagnosis not present

## 2015-10-23 DIAGNOSIS — G92 Toxic encephalopathy: Secondary | ICD-10-CM | POA: Diagnosis not present

## 2015-10-23 DIAGNOSIS — N2581 Secondary hyperparathyroidism of renal origin: Secondary | ICD-10-CM | POA: Diagnosis present

## 2015-10-23 DIAGNOSIS — Z91018 Allergy to other foods: Secondary | ICD-10-CM | POA: Diagnosis not present

## 2015-10-23 DIAGNOSIS — M545 Low back pain: Secondary | ICD-10-CM | POA: Diagnosis not present

## 2015-10-23 DIAGNOSIS — R001 Bradycardia, unspecified: Secondary | ICD-10-CM | POA: Diagnosis not present

## 2015-10-23 DIAGNOSIS — G40909 Epilepsy, unspecified, not intractable, without status epilepticus: Secondary | ICD-10-CM

## 2015-10-23 LAB — RAPID URINE DRUG SCREEN, HOSP PERFORMED
AMPHETAMINES: NOT DETECTED
BENZODIAZEPINES: NOT DETECTED
Barbiturates: NOT DETECTED
COCAINE: NOT DETECTED
Opiates: NOT DETECTED
Tetrahydrocannabinol: NOT DETECTED

## 2015-10-23 LAB — COMPREHENSIVE METABOLIC PANEL
ALK PHOS: 46 U/L (ref 38–126)
ALT: 13 U/L — ABNORMAL LOW (ref 17–63)
ANION GAP: 19 — AB (ref 5–15)
AST: 23 U/L (ref 15–41)
Albumin: 4.1 g/dL (ref 3.5–5.0)
BUN: 80 mg/dL — ABNORMAL HIGH (ref 6–20)
CALCIUM: 10.1 mg/dL (ref 8.9–10.3)
CO2: 19 mmol/L — AB (ref 22–32)
Chloride: 103 mmol/L (ref 101–111)
Creatinine, Ser: 10.61 mg/dL — ABNORMAL HIGH (ref 0.61–1.24)
GFR calc non Af Amer: 4 mL/min — ABNORMAL LOW (ref >60)
GFR, EST AFRICAN AMERICAN: 5 mL/min — AB (ref >60)
Glucose, Bld: 199 mg/dL — ABNORMAL HIGH (ref 65–99)
POTASSIUM: 4.9 mmol/L (ref 3.5–5.1)
SODIUM: 141 mmol/L (ref 135–145)
TOTAL PROTEIN: 7.4 g/dL (ref 6.5–8.1)
Total Bilirubin: 0.8 mg/dL (ref 0.3–1.2)

## 2015-10-23 LAB — URINALYSIS, ROUTINE W REFLEX MICROSCOPIC
Bilirubin Urine: NEGATIVE
Glucose, UA: 250 mg/dL — AB
Ketones, ur: NEGATIVE mg/dL
Leukocytes, UA: NEGATIVE
Nitrite: NEGATIVE
PH: 7.5 (ref 5.0–8.0)
Protein, ur: 100 mg/dL — AB
SPECIFIC GRAVITY, URINE: 1.011 (ref 1.005–1.030)

## 2015-10-23 LAB — URINE MICROSCOPIC-ADD ON

## 2015-10-23 LAB — CBC
HEMATOCRIT: 35.2 % — AB (ref 39.0–52.0)
HEMOGLOBIN: 11.2 g/dL — AB (ref 13.0–17.0)
MCH: 31.7 pg (ref 26.0–34.0)
MCHC: 31.8 g/dL (ref 30.0–36.0)
MCV: 99.7 fL (ref 78.0–100.0)
Platelets: 87 10*3/uL — ABNORMAL LOW (ref 150–400)
RBC: 3.53 MIL/uL — AB (ref 4.22–5.81)
RDW: 17.5 % — ABNORMAL HIGH (ref 11.5–15.5)
WBC: 3.9 10*3/uL — ABNORMAL LOW (ref 4.0–10.5)

## 2015-10-23 LAB — I-STAT CG4 LACTIC ACID, ED: Lactic Acid, Venous: 1.42 mmol/L (ref 0.5–2.0)

## 2015-10-23 LAB — INFLUENZA PANEL BY PCR (TYPE A & B)
H1N1FLUPCR: NOT DETECTED
INFLBPCR: NEGATIVE
Influenza A By PCR: NEGATIVE

## 2015-10-23 LAB — PROCALCITONIN: Procalcitonin: 5.59 ng/mL

## 2015-10-23 LAB — CBG MONITORING, ED: GLUCOSE-CAPILLARY: 178 mg/dL — AB (ref 65–99)

## 2015-10-23 MED ORDER — PIPERACILLIN-TAZOBACTAM IN DEX 2-0.25 GM/50ML IV SOLN
2.2500 g | Freq: Three times a day (TID) | INTRAVENOUS | Status: DC
  Administered 2015-10-23 – 2015-10-25 (×7): 2.25 g via INTRAVENOUS
  Filled 2015-10-23 (×11): qty 50

## 2015-10-23 MED ORDER — ACETAMINOPHEN 650 MG RE SUPP: 650.0000 mg | Freq: Four times a day (QID) | RECTAL | Status: DC | PRN

## 2015-10-23 MED ORDER — HEPARIN SODIUM (PORCINE) 5000 UNIT/ML IJ SOLN
5000.0000 [IU] | Freq: Three times a day (TID) | INTRAMUSCULAR | Status: DC
  Administered 2015-10-23: 5000 [IU] via SUBCUTANEOUS
  Filled 2015-10-23: qty 1

## 2015-10-23 MED ORDER — SODIUM CHLORIDE 0.9 % IV SOLN
62.5000 mg | Freq: Once | INTRAVENOUS | Status: AC
  Administered 2015-10-23: 62.5 mg via INTRAVENOUS
  Filled 2015-10-23: qty 5

## 2015-10-23 MED ORDER — SODIUM CHLORIDE 0.9 % IV SOLN: 62.5000 mg | INTRAVENOUS | Status: DC

## 2015-10-23 MED ORDER — DARBEPOETIN ALFA-POLYSORBATE 60 MCG/0.3ML IJ SOLN: 60.0000 ug | INTRAMUSCULAR | Status: DC

## 2015-10-23 MED ORDER — ACETAMINOPHEN 325 MG PO TABS
650.0000 mg | ORAL_TABLET | Freq: Four times a day (QID) | ORAL | Status: DC | PRN
  Administered 2015-10-23: 650 mg via ORAL
  Filled 2015-10-23: qty 2

## 2015-10-23 MED ORDER — VANCOMYCIN HCL IN DEXTROSE 1-5 GM/200ML-% IV SOLN: 1000.0000 mg | Freq: Once | INTRAVENOUS | Status: DC

## 2015-10-23 MED ORDER — GABAPENTIN 100 MG PO CAPS
100.0000 mg | ORAL_CAPSULE | Freq: Every day | ORAL | Status: DC
  Administered 2015-10-23 – 2015-10-27 (×5): 100 mg via ORAL
  Filled 2015-10-23 (×5): qty 1

## 2015-10-23 MED ORDER — ONDANSETRON HCL 4 MG PO TABS
4.0000 mg | ORAL_TABLET | Freq: Four times a day (QID) | ORAL | Status: DC | PRN
  Filled 2015-10-23: qty 1

## 2015-10-23 MED ORDER — SODIUM CHLORIDE 0.9 % IV BOLUS (SEPSIS)
1000.0000 mL | Freq: Once | INTRAVENOUS | Status: AC
  Administered 2015-10-23: 1000 mL via INTRAVENOUS

## 2015-10-23 MED ORDER — TRAMADOL HCL 50 MG PO TABS
50.0000 mg | ORAL_TABLET | Freq: Four times a day (QID) | ORAL | Status: DC | PRN
  Administered 2015-10-23 – 2015-10-27 (×4): 50 mg via ORAL
  Filled 2015-10-23 (×3): qty 1

## 2015-10-23 MED ORDER — NEPRO/CARBSTEADY PO LIQD
237.0000 mL | Freq: Two times a day (BID) | ORAL | Status: DC
  Administered 2015-10-23 – 2015-10-27 (×5): 237 mL via ORAL
  Filled 2015-10-23 (×11): qty 237

## 2015-10-23 MED ORDER — CALCITRIOL 0.25 MCG PO CAPS
0.7500 ug | ORAL_CAPSULE | ORAL | Status: DC
  Administered 2015-10-25: 0.75 ug via ORAL
  Filled 2015-10-23 (×2): qty 1

## 2015-10-23 MED ORDER — FENTANYL CITRATE (PF) 100 MCG/2ML IJ SOLN
50.0000 ug | Freq: Once | INTRAMUSCULAR | Status: AC
  Administered 2015-10-23: 50 ug via INTRAVENOUS
  Filled 2015-10-23: qty 2

## 2015-10-23 MED ORDER — FOLIC ACID 1 MG PO TABS
1.0000 mg | ORAL_TABLET | Freq: Every day | ORAL | Status: DC
  Administered 2015-10-23 – 2015-10-27 (×5): 1 mg via ORAL
  Filled 2015-10-23 (×5): qty 1

## 2015-10-23 MED ORDER — VANCOMYCIN HCL 10 G IV SOLR
2000.0000 mg | Freq: Once | INTRAVENOUS | Status: AC
  Administered 2015-10-23: 2000 mg via INTRAVENOUS
  Filled 2015-10-23: qty 2000

## 2015-10-23 MED ORDER — ONDANSETRON HCL 4 MG/2ML IJ SOLN
4.0000 mg | Freq: Four times a day (QID) | INTRAMUSCULAR | Status: DC | PRN
  Administered 2015-10-23: 4 mg via INTRAVENOUS

## 2015-10-23 MED ORDER — CALCIUM ACETATE (PHOS BINDER) 667 MG PO CAPS
2001.0000 mg | ORAL_CAPSULE | Freq: Three times a day (TID) | ORAL | Status: DC
  Administered 2015-10-24 – 2015-10-27 (×10): 2001 mg via ORAL
  Filled 2015-10-23 (×15): qty 3

## 2015-10-23 MED ORDER — PIPERACILLIN-TAZOBACTAM 3.375 G IVPB 30 MIN: 3.3750 g | Freq: Once | INTRAVENOUS | Status: DC

## 2015-10-23 MED ORDER — VANCOMYCIN HCL IN DEXTROSE 1-5 GM/200ML-% IV SOLN
1000.0000 mg | INTRAVENOUS | Status: DC
Start: 2015-10-24 — End: 2015-10-24
  Filled 2015-10-23: qty 200

## 2015-10-23 MED ORDER — LACOSAMIDE 50 MG PO TABS
100.0000 mg | ORAL_TABLET | Freq: Two times a day (BID) | ORAL | Status: DC
  Administered 2015-10-24 – 2015-10-27 (×7): 100 mg via ORAL
  Filled 2015-10-23 (×7): qty 2

## 2015-10-23 MED ORDER — AMLODIPINE BESYLATE 10 MG PO TABS
10.0000 mg | ORAL_TABLET | Freq: Every day | ORAL | Status: DC
  Administered 2015-10-23: 10 mg via ORAL
  Filled 2015-10-23: qty 2
  Filled 2015-10-23: qty 1

## 2015-10-23 MED ORDER — LACOSAMIDE 50 MG PO TABS
50.0000 mg | ORAL_TABLET | ORAL | Status: DC
  Filled 2015-10-23: qty 1

## 2015-10-23 MED ORDER — CINACALCET HCL 30 MG PO TABS
30.0000 mg | ORAL_TABLET | Freq: Every day | ORAL | Status: DC
  Administered 2015-10-24 – 2015-10-27 (×4): 30 mg via ORAL
  Filled 2015-10-23 (×5): qty 1

## 2015-10-23 MED ORDER — CAMPHOR-MENTHOL 0.5-0.5 % EX LOTN
TOPICAL_LOTION | CUTANEOUS | Status: DC | PRN
  Filled 2015-10-23: qty 222

## 2015-10-23 MED ORDER — DOCUSATE SODIUM 100 MG PO CAPS
100.0000 mg | ORAL_CAPSULE | Freq: Two times a day (BID) | ORAL | Status: DC
  Administered 2015-10-23 – 2015-10-27 (×8): 100 mg via ORAL
  Filled 2015-10-23 (×8): qty 1

## 2015-10-23 MED ORDER — ONDANSETRON HCL 4 MG/2ML IJ SOLN
INTRAMUSCULAR | Status: AC
  Administered 2015-10-23: 4 mg via INTRAVENOUS
  Filled 2015-10-23: qty 2

## 2015-10-23 NOTE — Progress Notes (Addendum)
Hemodialysis- Tx stopped per Dr. Hyman Hopes. Heparin instilled into both cath lumens, capped, and taped. Pt continues to have chills, however afebrile. Unable to UF to goal d/t drops in BP. Rapid Response RN took look at patient prior to returning to room. No heparin HD treatment per orders. Report called to primary RN on 6N

## 2015-10-23 NOTE — ED Notes (Signed)
Pt provided w/ urinal, unable to void at this time. Pt's wife at bedside.

## 2015-10-23 NOTE — ED Notes (Signed)
Pt transferred to hospital bed for comfort d/t extended wait times for admission.

## 2015-10-23 NOTE — ED Notes (Addendum)
Contacted dialysis center. Right arm is safe to use for blood draws.

## 2015-10-23 NOTE — ED Notes (Signed)
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 Clarene Duke, EDP.

## 2015-10-23 NOTE — ED Notes (Signed)
Per [facility] - pt from home w/ AMS per pt's wife, pt awoke this morning disoriented, confused and unable to ambulate per pts norm (pt normally ambulates with walker) - pt is a dialysis pt, received dialysis yesterday - pt oriented only to self, c/o generalized body pain.

## 2015-10-23 NOTE — ED Notes (Signed)
x2 sets of blood cultures drawn at 11:02

## 2015-10-23 NOTE — Progress Notes (Signed)
Hemodialysis- System clotted after 3 hours on treatment. While stringing new set up pt vomited approx 200cc coffee ground emesis. Pt suctioned and given zofran. Linens changed. Dr. Hyman Hopes paged. Vitals stable at this time.

## 2015-10-23 NOTE — ED Provider Notes (Signed)
CSN: [id number]     Arrival date & time 10/23/15  5188 History   First MD Initiated Contact with Patient 10/23/15 [phone]     Chief Complaint  Patient presents with  . Altered Mental Status     (Consider location/radiation/quality/duration/timing/severity/associated sxs/prior Treatment) HPI Comments: 82 year old male with extensive past medical history below including ESRD on HD, type 2 diabetes mellitus who presents with altered mental status. History limited because of the patient's altered mentation and obtained primarily from EMS. EMS reports that they brought the patient from his home, where his wife called for altered mental status that began this morning. He has been disoriented, confused, and unable to ambulate; normally ambulates with a walker. Last dialysis was yesterday. Patient currently complains of pain "all over." He is not able to characterize any further.  LEVEL 5 CAVEAT 2/2 AMS  Patient is a 82 y.o. male presenting with altered mental status. The history is provided by the EMS personnel and the spouse.  Altered Mental Status   Past Medical History  Diagnosis Date  . Hypertension   . Diabetes mellitus without complication (HCC)   . Arthritis   . History of blood transfusion   . GI bleed   . ESRD (end stage renal disease) (HCC)   . Seizure (HCC)   . Dialysis patient (HCC)   . Subdural hematoma (HCC) 05/2015    from fall  . Sleep apnea     wears CPAP n/c  . Pulmonary hypertension (HCC)     severe pulmonary HTN by 01/14/13 echo   Past Surgical History  Procedure Laterality Date  . Esophagogastroduodenoscopy  10/03/2012    Procedure: ESOPHAGOGASTRODUODENOSCOPY (EGD);  Surgeon: Theda Belfast, MD;  Location: [facility] ENDOSCOPY;  Service: Endoscopy;  Laterality: N/A;  . Colonoscopy  10/04/2012    Procedure: COLONOSCOPY;  Surgeon: Theda Belfast, MD;  Location: [facility] ENDOSCOPY;  Service: Endoscopy;  Laterality: N/A;  . Colonoscopy with esophagogastroduodenoscopy (egd) Left  11/30/2012    Procedure: COLONOSCOPY WITH ESOPHAGOGASTRODUODENOSCOPY (EGD);  Surgeon: Theda Belfast, MD;  Location: [facility] ENDOSCOPY;  Service: Endoscopy;  Laterality: Left;  . Spine surgery  2004    back fusion/ [facility] Penumalli,MD  . Retinal laser procedure Right 04/2015  . Av fistula placement Left 09/09/2015    Procedure: INSERTION OF ARTERIOVENOUS  GORE-TEX GRAFT Left  ARM;  Surgeon: Sherren Kerns, MD;  Location: [facility] OR;  Service: Vascular;  Laterality: Left;   Family History  Problem Relation Age of Onset  . Sudden death Mother   . Sudden death Father    Social History  Substance Use Topics  . Smoking status: Never Smoker   . Smokeless tobacco: Never Used  . Alcohol Use: No    Review of Systems  Unable to perform ROS: Mental status change      Allergies  Aspirin and Lactose intolerance (gi)  Home Medications   Prior to Admission medications   Medication Sig Start Date End Date Taking? Authorizing Provider  amLODipine (NORVASC) 10 MG tablet Take 10 mg by mouth Daily.  08/15/12   Historical Provider, MD  calcitRIOL (ROCALTROL) 0.5 MCG capsule Take 2 capsules (1 mcg total) by mouth every Monday, Wednesday, and Friday with hemodialysis. Patient not taking: Reported on 09/05/2015 05/23/15   Lonia Blood, MD  calcium acetate (PHOSLO) 667 MG capsule Take 3 capsules by mouth three times a day Patient taking differently: Take 2,001 mg by mouth. Take 3 capsules by mouth three times a day 07/15/15   Shanda Bumps  Idolina Primer, NP  cinacalcet (SENSIPAR) 30 MG tablet Take 1 tablet (30 mg total) by mouth daily with supper. 09/18/15   Vassie Loll, MD  colchicine 0.6 MG tablet Take 1 tablet (0.6 mg total) by mouth as needed. For gout. Patient needs an appointment before anymore refills. 09/29/15   [name] L [name], DO  darbepoetin (ARANESP) 60 MCG/0.3ML SOLN injection Inject 0.3 mLs (60 mcg total) into the vein every Thursday with hemodialysis. Patient taking differently: Inject 60 mcg into the vein  every 7 (seven) days.  09/29/13   Joseph Art, DO  Diphenhydramine-APAP 25-500 MG TABS Take 1 tablet by mouth at bedtime as needed. For sleep    Historical Provider, MD  docusate sodium (COLACE) 100 MG capsule Take 1 capsule (100 mg total) by mouth 2 (two) times daily. 09/18/15   Vassie Loll, MD  folic acid (FOLVITE) 1 MG tablet Take 1 tablet (1 mg total) by mouth daily. 09/29/13   Joseph Art, DO  gabapentin (NEURONTIN) 100 MG capsule Take 1 capsule (100 mg total) by mouth daily. 05/23/15   Lonia Blood, MD  HUMALOG KWIKPEN 100 UNIT/ML KiwkPen Inject 0-10 Units into the skin 3 (three) times daily. Sliding Scale 04/16/15   Historical Provider, MD  lacosamide (VIMPAT) 50 MG TABS tablet Take 1 tablet (50 mg total) by mouth every Monday, Wednesday, and Friday with hemodialysis. 07/28/15   Joseph Art, DO  Lacosamide 100 MG TABS Take 1 tablet (100 mg total) by mouth 2 (two) times daily. Patient taking differently: Take 100 mg by mouth as directed. Take 1 tablet twice a day on NON dialysis days 07/21/15   [name] L [name], DO  lidocaine (LIDODERM) 5 % Place 1 patch onto the skin daily. Remove & Discard patch within 12 hours or as directed by MD 09/18/15   Vassie Loll, MD  multivitamin (RENA-VIT) TABS tablet Take 1 tablet by mouth at bedtime. Patient not taking: Reported on 09/10/2015 09/29/13   Joseph Art, DO  Nutritional Supplements (FEEDING SUPPLEMENT, NEPRO CARB STEADY,) LIQD Take 237 mLs by mouth 2 (two) times daily between meals. 09/18/15   Vassie Loll, MD  sodium chloride 0.9 % SOLN 100 mL with ferric gluconate 12.5 MG/ML SOLN 62.5 mg Inject 62.5 mg into the vein every Thursday with hemodialysis. 09/29/13   Joseph Art, DO  traMADol (ULTRAM) 50 MG tablet Take one tablet by mouth every 6 hours as needed for pain. Patient needs appointment before anymore refills. 09/29/15   [name] L [name], DO   BP 146/42 mmHg  Pulse 90  Temp(Src) 100.1 F (37.8 C) (Rectal)  Resp 19  SpO2  97% Physical Exam  Constitutional: No distress.  Chronically ill appearing, mildly tremulous, eyes closed  HENT:  Head: Normocephalic and atraumatic.  Eyes: Conjunctivae are normal. Pupils are equal, round, and reactive to light.  Neck: Neck supple.  Cardiovascular: Normal rate, regular rhythm and normal heart sounds.   No murmur heard. Pulmonary/Chest: Effort normal and breath sounds normal.  Diminished breath sounds bilaterally  Abdominal: Soft. Bowel sounds are normal. He exhibits no distension. There is no tenderness.  Occasional moans during abdominal exam but no focal abdominal tenderness  Musculoskeletal: He exhibits no edema.  Neurological:  Oriented only to person  Skin: Skin is warm and dry.  Nursing note and vitals reviewed.   ED Course  Procedures (including critical care time) Labs Review Labs Reviewed  COMPREHENSIVE METABOLIC PANEL - Abnormal; Notable for the following:    CO2  19 (*)    Glucose, Bld 199 (*)    BUN 80 (*)    Creatinine, Ser 10.61 (*)    ALT 13 (*)    GFR calc non Af Amer 4 (*)    GFR calc Af Amer 5 (*)    Anion gap 19 (*)    All other components within normal limits  CBC - Abnormal; Notable for the following:    WBC 3.9 (*)    RBC 3.53 (*)    Hemoglobin 11.2 (*)    HCT 35.2 (*)    RDW 17.5 (*)    Platelets 87 (*)    All other components within normal limits  URINALYSIS, ROUTINE W REFLEX MICROSCOPIC (NOT AT [facility]) - Abnormal; Notable for the following:    Glucose, UA 250 (*)    Hgb urine dipstick MODERATE (*)    Protein, ur 100 (*)    All other components within normal limits  URINE MICROSCOPIC-ADD ON - Abnormal; Notable for the following:    Squamous Epithelial / LPF 0-5 (*)    Bacteria, UA RARE (*)    All other components within normal limits  CBG MONITORING, ED - Abnormal; Notable for the following:    Glucose-Capillary 178 (*)    All other components within normal limits  URINE CULTURE  CULTURE, BLOOD (ROUTINE X 2)  CULTURE,  BLOOD (ROUTINE X 2)  URINE RAPID DRUG SCREEN, HOSP PERFORMED  PROCALCITONIN  INFLUENZA PANEL BY PCR (TYPE A & B, H1N1)  HEMOGLOBIN A1C  I-STAT CG4 LACTIC ACID, ED  I-STAT CG4 LACTIC ACID, ED    Imaging Review Dg Chest 2 View  10/23/2015  CLINICAL DATA:  Altered mental status.  ESRD EXAM: CHEST  2 VIEW COMPARISON:  09/15/2015 FINDINGS: Right jugular dialysis catheter in the right atrium unchanged. Heart size normal. Normal vascularity. No edema or effusion. Negative for pneumonia. IMPRESSION: No active cardiopulmonary disease. Electronically Signed   By: Marlan Palau M.D.   On: 10/23/2015 10:42   Ct Head Wo Contrast  10/23/2015  CLINICAL DATA:  Altered mental status.  ESRD. EXAM: CT HEAD WITHOUT CONTRAST TECHNIQUE: Contiguous axial images were obtained from the base of the skull through the vertex without intravenous contrast. COMPARISON:  09/15/2015 FINDINGS: Image quality degraded by motion multiple repeat images performed Moderate atrophy. Moderate to advanced chronic microvascular ischemic change throughout the white matter, similar to the prior CT. Negative for acute infarct. Negative for acute hemorrhage or mass lesion. Calvarium intact.  Mild mucosal edema paranasal sinuses. IMPRESSION: Atrophy and chronic microvascular ischemia unchanged from the prior study. No acute abnormality. Electronically Signed   By: Marlan Palau M.D.   On: 10/23/2015 10:30   I have personally reviewed and evaluated these lab results as part of my medical decision-making.   EKG Interpretation None     Medications  piperacillin-tazobactam (ZOSYN) IVPB 3.375 g (not administered)  vancomycin (VANCOCIN) IVPB 1000 mg/200 mL premix (not administered)  sodium chloride 0.9 % bolus 1,000 mL (not administered)    MDM   Final diagnoses:  Altered mental status, unspecified altered mental status type   Patient presents from home with altered mental status that began this morning according to wife. On exam, he  was chronically ill appearing and tremulous. Vital signs notable for temperature 100.1. Normal work of breathing. He moaned a few times during abdominal exam but no focal abdominal tenderness. Because of borderline temperature and patient's altered mentation in the setting of multiple comorbidities, initiated a code sepsis with  blood and urine cultures, lactate, and broad-spectrum antibiotics. Gave the patient 1 L of IV fluids while awaiting chest x-ray.  Labwork shows normal lactate, CBC unchanged from previous, normal potassium, glucose 199. Chest x-ray negative for acute process and head CT shows no acute findings to explain the patient's symptoms. I discussed admission with Jill Side with [facility] hospitalists, given pt's ongoing AMS of unclear etiology. Pt admitted to Dr. Burna Sis service for further care.  Laurence Spates, MD 10/23/15 [phone]

## 2015-10-23 NOTE — Consult Note (Signed)
[facility] Renal Consultation Note    Indication for Consultation:  Management of ESRD/hemodialysis; anemia, hypertension/volume and secondary hyperparathyroidism PCP: [name], DO  HPI: [name] is a 82 y.o. male with ESRD on MWF HD with history of multiple medical issues including prior SDH 05/2015.  He had a recent admission last month with AMS with no overt explanation. According to dialysis records and confirmed by phone call to the dialysis unit, his last dialysis was Monday of this week.  He went to dialysis Wednesday, but sat in the lobby, refused to go it and has his wife called to pick him up.  He told the dialysis staff that he just didn't feel right; he was very nonspecific.  Today his wife tells me he did dialysis Wednesday and perhaps she thought he did.  When I asked his wife why he was here today she said he fell at home and then said, maybe it was yesterday, no maybe today.  I asked her if he was able to get off the floor by himself and she said she helped and also called EMS. She tells me she drives him to and from dialysis and he has been getting home PT for deconditioning.    According to the admission H and P, EMS was called due to pt's inability to ambulate and increasing confusion. Pt is unable to articulate why he is here.  Evaluation in the ED showed neg CXR, Head CT neg for acute changes. WBC 3.9 BUN 80 and Cr 10.6 K CO2 19 K 4.9 He received 1 L IVF in the ED temp was 100.1 BP was not low.EKG negative.  Plan to dialysis today.  Past Medical History  Diagnosis Date  . Hypertension   . Diabetes mellitus without complication (HCC)   . Arthritis   . History of blood transfusion   . GI bleed   . ESRD (end stage renal disease) (HCC)   . Seizure (HCC)   . Dialysis patient (HCC)   . Subdural hematoma (HCC) 05/2015    from fall  . Sleep apnea     wears CPAP n/c  . Pulmonary hypertension (HCC)     severe pulmonary HTN by 01/14/13 echo   Past Surgical  History  Procedure Laterality Date  . Esophagogastroduodenoscopy  10/03/2012    Procedure: ESOPHAGOGASTRODUODENOSCOPY (EGD);  Surgeon: Theda Belfast, MD;  Location: [facility] ENDOSCOPY;  Service: Endoscopy;  Laterality: N/A;  . Colonoscopy  10/04/2012    Procedure: COLONOSCOPY;  Surgeon: Theda Belfast, MD;  Location: [facility] ENDOSCOPY;  Service: Endoscopy;  Laterality: N/A;  . Colonoscopy with esophagogastroduodenoscopy (egd) Left 11/30/2012    Procedure: COLONOSCOPY WITH ESOPHAGOGASTRODUODENOSCOPY (EGD);  Surgeon: Theda Belfast, MD;  Location: [facility] ENDOSCOPY;  Service: Endoscopy;  Laterality: Left;  . Spine surgery  2004    back fusion/ [facility] Penumalli,MD  . Retinal laser procedure Right 04/2015  . Av fistula placement Left 09/09/2015    Procedure: INSERTION OF ARTERIOVENOUS  GORE-TEX GRAFT Left  ARM;  Surgeon: Sherren Kerns, MD;  Location: [facility] OR;  Service: Vascular;  Laterality: Left;   Family History  Problem Relation Age of Onset  . Sudden death Mother   . Sudden death Father    Social History:  reports that he has never smoked. He has never used smokeless tobacco. He reports that he does not drink alcohol or use illicit drugs. Allergies  Allergen Reactions  . Aspirin Other (See Comments)    Bleeding   . Lactose  Intolerance (Gi) Diarrhea   Prior to Admission medications   Medication Sig Start Date End Date Taking? Authorizing Provider  amLODipine (NORVASC) 10 MG tablet Take 10 mg by mouth Daily.  08/15/12   Historical Provider, MD  calcitRIOL (ROCALTROL) 0.5 MCG capsule Take 2 capsules (1 mcg total) by mouth every Monday, Wednesday, and Friday with hemodialysis. Patient not taking: Reported on 09/05/2015 05/23/15   Lonia Blood, MD  calcium acetate (PHOSLO) 667 MG capsule Take 3 capsules by mouth three times a day Patient taking differently: Take 2,001 mg by mouth. Take 3 capsules by mouth three times a day 07/15/15   Sharon Seller, NP  cinacalcet (SENSIPAR) 30 MG tablet Take 1 tablet  (30 mg total) by mouth daily with supper. 09/18/15   Vassie Loll, MD  colchicine 0.6 MG tablet Take 1 tablet (0.6 mg total) by mouth as needed. For gout. Patient needs an appointment before anymore refills. 09/29/15   [name] L [name], DO  darbepoetin (ARANESP) 60 MCG/0.3ML SOLN injection Inject 0.3 mLs (60 mcg total) into the vein every Thursday with hemodialysis. Patient taking differently: Inject 60 mcg into the vein every 7 (seven) days.  09/29/13   Joseph Art, DO  Diphenhydramine-APAP 25-500 MG TABS Take 1 tablet by mouth at bedtime as needed. For sleep    Historical Provider, MD  docusate sodium (COLACE) 100 MG capsule Take 1 capsule (100 mg total) by mouth 2 (two) times daily. 09/18/15   Vassie Loll, MD  folic acid (FOLVITE) 1 MG tablet Take 1 tablet (1 mg total) by mouth daily. 09/29/13   Joseph Art, DO  gabapentin (NEURONTIN) 100 MG capsule Take 1 capsule (100 mg total) by mouth daily. 05/23/15   Lonia Blood, MD  HUMALOG KWIKPEN 100 UNIT/ML KiwkPen Inject 0-10 Units into the skin 3 (three) times daily. Sliding Scale 04/16/15   Historical Provider, MD  lacosamide (VIMPAT) 50 MG TABS tablet Take 1 tablet (50 mg total) by mouth every Monday, Wednesday, and Friday with hemodialysis. 07/28/15   Joseph Art, DO  Lacosamide 100 MG TABS Take 1 tablet (100 mg total) by mouth 2 (two) times daily. Patient taking differently: Take 100 mg by mouth as directed. Take 1 tablet twice a day on NON dialysis days 07/21/15   [name] L [name], DO  lidocaine (LIDODERM) 5 % Place 1 patch onto the skin daily. Remove & Discard patch within 12 hours or as directed by MD 09/18/15   Vassie Loll, MD  multivitamin (RENA-VIT) TABS tablet Take 1 tablet by mouth at bedtime. Patient not taking: Reported on 09/10/2015 09/29/13   Joseph Art, DO  Nutritional Supplements (FEEDING SUPPLEMENT, NEPRO CARB STEADY,) LIQD Take 237 mLs by mouth 2 (two) times daily between meals. 09/18/15   Vassie Loll, MD  sodium chloride  0.9 % SOLN 100 mL with ferric gluconate 12.5 MG/ML SOLN 62.5 mg Inject 62.5 mg into the vein every Thursday with hemodialysis. 09/29/13   Joseph Art, DO  traMADol (ULTRAM) 50 MG tablet Take one tablet by mouth every 6 hours as needed for pain. Patient needs appointment before anymore refills. 09/29/15   [name] L [name], DO   Current Facility-Administered Medications  Medication Dose Route Frequency Provider Last Rate Last Dose  . acetaminophen (TYLENOL) tablet 650 mg  650 mg Oral Q6H PRN Russella Dar, NP       Or  . acetaminophen (TYLENOL) suppository 650 mg  650 mg Rectal Q6H PRN Russella Dar, NP      .  amLODipine (NORVASC) tablet 10 mg  10 mg Oral Daily Russella Dar, NP      . calcium acetate (PHOSLO) capsule 2,001 mg  2,001 mg Oral TID WC Russella Dar, NP      . cinacalcet (SENSIPAR) tablet 30 mg  30 mg Oral Q supper Russella Dar, NP      . darbepoetin (ARANESP) injection 60 mcg  60 mcg Intravenous Q7 days Russella Dar, NP      . docusate sodium (COLACE) capsule 100 mg  100 mg Oral BID Russella Dar, NP      . feeding supplement (NEPRO CARB STEADY) liquid 237 mL  237 mL Oral BID BM Russella Dar, NP      . Melene Muller ON 10/30/2015] ferric gluconate (NULECIT) 62.5 mg in sodium chloride 0.9 % 100 mL IVPB  62.5 mg Intravenous Q Thu-HD Russella Dar, NP      . folic acid (FOLVITE) tablet 1 mg  1 mg Oral Daily Russella Dar, NP      . gabapentin (NEURONTIN) capsule 100 mg  100 mg Oral Daily Russella Dar, NP      . heparin injection 5,000 Units  5,000 Units Subcutaneous 3 times per day Russella Dar, NP      . Melene Muller ON 10/24/2015] lacosamide (VIMPAT) tablet 50 mg  50 mg Oral Q M,W,F-HD Russella Dar, NP      . Lacosamide TABS 100 mg  100 mg Oral UD Russella Dar, NP      . piperacillin-tazobactam (ZOSYN) IVPB 2.25 g  2.25 g Intravenous 3 times per day Laurence Spates, MD   Stopped at 10/23/15 1133  . traMADol (ULTRAM) tablet 50 mg  50 mg Oral Q6H PRN Russella Dar, NP      . Melene Muller ON 10/24/2015] vancomycin (VANCOCIN) IVPB 1000 mg/200 mL premix  1,000 mg Intravenous Q M,W,F-HD Bertram Millard, [facility]       Current Outpatient Prescriptions  Medication Sig Dispense Refill  . amLODipine (NORVASC) 10 MG tablet Take 10 mg by mouth Daily.     . calcitRIOL (ROCALTROL) 0.5 MCG capsule Take 2 capsules (1 mcg total) by mouth every Monday, Wednesday, and Friday with hemodialysis. (Patient not taking: Reported on 09/05/2015)    . calcium acetate (PHOSLO) 667 MG capsule Take 3 capsules by mouth three times a day (Patient taking differently: Take 2,001 mg by mouth. Take 3 capsules by mouth three times a day) 180 capsule 3  . cinacalcet (SENSIPAR) 30 MG tablet Take 1 tablet (30 mg total) by mouth daily with supper.    . colchicine 0.6 MG tablet Take 1 tablet (0.6 mg total) by mouth as needed. For gout. Patient needs an appointment before anymore refills. 30 tablet 0  . darbepoetin (ARANESP) 60 MCG/0.3ML SOLN injection Inject 0.3 mLs (60 mcg total) into the vein every Thursday with hemodialysis. (Patient taking differently: Inject 60 mcg into the vein every 7 (seven) days. ) 4.2 mL   . Diphenhydramine-APAP 25-500 MG TABS Take 1 tablet by mouth at bedtime as needed. For sleep    . docusate sodium (COLACE) 100 MG capsule Take 1 capsule (100 mg total) by mouth 2 (two) times daily. 60 capsule 0  . folic acid (FOLVITE) 1 MG tablet Take 1 tablet (1 mg total) by mouth daily. 30 tablet 0  . gabapentin (NEURONTIN) 100 MG capsule Take 1 capsule (100 mg total) by mouth daily. 30 capsule 0  .  HUMALOG KWIKPEN 100 UNIT/ML KiwkPen Inject 0-10 Units into the skin 3 (three) times daily. Sliding Scale  0  . lacosamide (VIMPAT) 50 MG TABS tablet Take 1 tablet (50 mg total) by mouth every Monday, Wednesday, and Friday with hemodialysis. 15 tablet 1  . Lacosamide 100 MG TABS Take 1 tablet (100 mg total) by mouth 2 (two) times daily. (Patient taking differently: Take 100 mg by mouth as  directed. Take 1 tablet twice a day on NON dialysis days) 60 tablet 3  . lidocaine (LIDODERM) 5 % Place 1 patch onto the skin daily. Remove & Discard patch within 12 hours or as directed by MD 30 patch 0  . multivitamin (RENA-VIT) TABS tablet Take 1 tablet by mouth at bedtime. (Patient not taking: Reported on 09/10/2015) 30 tablet 0  . Nutritional Supplements (FEEDING SUPPLEMENT, NEPRO CARB STEADY,) LIQD Take 237 mLs by mouth 2 (two) times daily between meals.    . sodium chloride 0.9 % SOLN 100 mL with ferric gluconate 12.5 MG/ML SOLN 62.5 mg Inject 62.5 mg into the vein every Thursday with hemodialysis.    Marland Kitchen traMADol (ULTRAM) 50 MG tablet Take one tablet by mouth every 6 hours as needed for pain. Patient needs appointment before anymore refills. 120 tablet 0   Facility-Administered Medications Ordered in Other Encounters  Medication Dose Route Frequency Provider Last Rate Last Dose  . Chlorhexidine Gluconate Cloth 2 % PADS 6 each  6 each Topical Once Sherren Kerns, MD       Labs: Basic Metabolic Panel:  Recent Labs Lab 10/23/15 1108  NA 141  K 4.9  CL 103  CO2 19*  GLUCOSE 199*  BUN 80*  CREATININE 10.61*  CALCIUM 10.1   Liver Function Tests:  Recent Labs Lab 10/23/15 1108  AST 23  ALT 13*  ALKPHOS 46  BILITOT 0.8  PROT 7.4  ALBUMIN 4.1   CBC:  Recent Labs Lab 10/23/15 1108  WBC 3.9*  HGB 11.2*  HCT 35.2*  MCV 99.7  PLT 87*   CBG:  Recent Labs Lab 10/23/15 0951  GLUCAP 178*   Studies/Results: Dg Chest 2 View  10/23/2015  CLINICAL DATA:  Altered mental status.  ESRD EXAM: CHEST  2 VIEW COMPARISON:  09/15/2015 FINDINGS: Right jugular dialysis catheter in the right atrium unchanged. Heart size normal. Normal vascularity. No edema or effusion. Negative for pneumonia. IMPRESSION: No active cardiopulmonary disease. Electronically Signed   By: Marlan Palau M.D.   On: 10/23/2015 10:42   Ct Head Wo Contrast  10/23/2015  CLINICAL DATA:  Altered mental status.   ESRD. EXAM: CT HEAD WITHOUT CONTRAST TECHNIQUE: Contiguous axial images were obtained from the base of the skull through the vertex without intravenous contrast. COMPARISON:  09/15/2015 FINDINGS: Image quality degraded by motion multiple repeat images performed Moderate atrophy. Moderate to advanced chronic microvascular ischemic change throughout the white matter, similar to the prior CT. Negative for acute infarct. Negative for acute hemorrhage or mass lesion. Calvarium intact.  Mild mucosal edema paranasal sinuses. IMPRESSION: Atrophy and chronic microvascular ischemia unchanged from the prior study. No acute abnormality. Electronically Signed   By: Marlan Palau M.D.   On: 10/23/2015 10:30    ROS: As per HPI otherwise negative. Physical Exam: Filed Vitals:   10/23/15 1245 10/23/15 1345 10/23/15 1400 10/23/15 1430  BP: 134/44 176/52 180/45 135/29  Pulse: 96 92 92 97  Temp:      TempSrc:      Resp: 16 21 17 19   Weight:  SpO2: 98% 97% 97% 96%     General:  Elderly AAM  curled up on stretcher Head: Normocephalic, atraumatic, sclera non-icteric, mucus membranes are moist, HOH (right ear is best) Neck: Supple. JVD not elevated. Lungs: Clear bilaterally to auscultation without wheezes, rales, or rhonchi. Breathing is unlabored. Heart: RRR with S1 S2. No murmurs, rubs, or gallops appreciated. Abdomen: Soft, non-tender, non-distended with normoactive bowel sounds. No rebound/guarding. No obvious abdominal masses. M-S:  Strength and tone appear normal for age. Lower extremities:without edema or ischemic changes, no open wounds  Neuro: Awake - not answering any of my questions. Oriented to place- seems to recognize me but never knows my name Dialysis Access: right IJ and left upper AVGG + bruit well healed  Dialysis Orders: Center: [place]  MWF - 4 hr EDW 89.5 2K 2.25 Ca heparin none venofer 50 per week mircera 200 q 2 weeks - last 2/8 calcitriiol 0.75 right IJ and left upper AVGG maturing - pt  won't use until he gets numbing med- it is ready to use Labs;  Most recent hgb 9.5 40% sat iPTH 548 - last HD 2/13 - left below edw with net UF 2.5 -still on no heparin HD due to SDH hx  Assessment/Plan: 1.  Acute encephalopathy - work up per primary - low grade temp , cultures/ empiric antibiotics; no obvious source at present 2.  ESRD -  MWF - plan HD today; plan use AVGG - BUN 80 Cr 10.6  CO2  19 missed Wed HD also runs short at times; attempt to use AVGG (placed 1/3 ready to use)- 3.  Hypertension/volume  - variable  - getting +/- edw - when feeling better, need to be certain he is NOT orthostatic as he ambulates on his own around the house; no overt volume BP in usual range 4.  Anemia  - hgb 11.2 - due for redose ESA 2/24 - follow trends may need lower dose ESA 5.  Metabolic bone disease -  Ca 10.1 alb 4.1 - continue same /sensipar/phoslo - hold calcitriol today - Ca 10.1 - resume on MWF schedule 6.  Nutrition - renal diet/vit/supplements alb 4.1 7.    Seizure disorder - on Vimpat 8.    DNR - pt has some degree of dementia as does I think his wife- at the very least she has short term memory issues; I think this is very difficult for them as they are quite highly educated.  [name], PA-C [facility] Beeper [phone] 10/23/2015, 3:00 PM

## 2015-10-23 NOTE — Progress Notes (Signed)
PCT 5.59 which is suggestive of infectious process, UA and UDS negative as source of infection or explanation for AMS. Continue anbxs and FU on blood cx's  Junious Silk, ANP

## 2015-10-23 NOTE — ED Notes (Signed)
Patient transported to CT 

## 2015-10-23 NOTE — Progress Notes (Signed)
Pharmacy Antibiotic Note  [name] is a 82 y.o. male admitted on 10/23/2015 with sepsis.  Pharmacy has been consulted for vancomycin and zosyn dosing. Patient is on MWF HD with last session 2/15  Plan: Vancomycin 2 g x 1 then 1 g with each HD session Zosyn 2.25 gm IV q8h  Weight: 198 lb 10.2 oz (90.1 kg)  Temp (24hrs), Avg:100.1 F (37.8 C), Min:100.1 F (37.8 C), Max:100.1 F (37.8 C)  No results for input(s): WBC, CREATININE, LATICACIDVEN, VANCOTROUGH, VANCOPEAK, VANCORANDOM, GENTTROUGH, GENTPEAK, GENTRANDOM, TOBRATROUGH, TOBRAPEAK, TOBRARND, AMIKACINPEAK, AMIKACINTROU, AMIKACIN in the last 168 hours.  CrCl cannot be calculated (Patient has no serum creatinine result on file.).    Allergies  Allergen Reactions  . Aspirin Other (See Comments)    Bleeding   . Lactose Intolerance (Gi) Diarrhea    Antimicrobials this admission: Vancomycin 2/16>> Zosyn 2/16>>  Dose adjustments this admission: N/A  Microbiology results: 2/16 Urine>> 2/16 Blood x 2>>  Pharmacy Code Sepsis Protocol  Time of code sepsis page: 1005 [x]  Antibiotics delivered at 1019  Were antibiotics ordered at the time of the code sepsis page? Yes Was it required to contact the physician? []  Physician not contacted []  Physician contacted to order antibiotics for code sepsis []  Physician contacted to recommend changing antibiotics  Pharmacy consulted for: vancomycin and zosyn   , PharmD, BCPS, [facility] Clinical Pharmacist Pager [phone] 10/23/2015 10:26 AM

## 2015-10-23 NOTE — ED Notes (Signed)
Pt made aware of bed assignment 

## 2015-10-23 NOTE — ED Notes (Signed)
Ordered renal diet, dinner tray for pt.

## 2015-10-23 NOTE — H&P (Signed)
[facility] History and Physical                                                                                    [name], is a 82 y.o. male  MRN: [id number]   DOB - 06-12-1932  Admit Date - 10/23/2015  Outpatient Primary MD for the patient is Hollace Kinnier, DO/[facility] patient  Referring MD: Little / ER  Consulting M.D: Justin Mend / Renal  PMH: Past Medical History  Diagnosis Date  . Hypertension   . Diabetes mellitus without complication ([place])   . Arthritis   . History of blood transfusion   . GI bleed   . ESRD (end stage renal disease) ([place])   . Seizure ([place])   . Dialysis patient ([place])   . Subdural hematoma ([place]) 05/2015    from fall  . Sleep apnea     wears CPAP n/c  . Pulmonary hypertension (H[facility])     severe pulmonary HTN by 01/14/13 echo      PSH: Past Surgical History  Procedure Laterality Date  . Esophagogastroduodenoscopy  10/03/2012    Procedure: ESOPHAGOGASTRODUODENOSCOPY (EGD);  Surgeon: Beryle Beams, MD;  Location[facility] END[facility]COPY;  Service: Endoscopy;  Laterality: N/A;  . Colonoscopy  10/04/2012    Procedure: COLONOSCOPY;  Surgeon: Beryle Beams, MD;  Location: [facility] ENDOSCOPY;  Service: Endoscopy;  Laterality: N/A;  . Colonoscopy with esophagogastroduodenoscopy (egd) Left 11/30/2012    Procedure: COLONOSCOPY WITH ESOPHAGOGASTRODUODENOSCOPY (EGD);  Surgeon: Beryle Beams, MD;  Location: [facility] ENDOSCOPY;  Service: Endoscopy;  Laterality: Left;  . Spine surgery  2004    back fusion/ [facility] Penumalli,MD  . Retinal laser procedure Right 04/2015  . Av fistula placement Left 09/09/2015    Procedure: INSERTION OF ARTERIOVENOUS  GORE-TEX GRAFT Left  ARM;  Surgeon: Elam Dutch, MD;  Location: [facility];  Service: Vascular;  Laterality: Left;     CC:  Chief Complaint  Patient presents with  . Altered Mental Status     HPI: This is an 82 year old male patient known chronic kidney disease on dialysis, seizure disorder on antiepileptic drugs, history of traumatic  subdural hematoma November 2016, documented diabetes on sliding scale insulin at home, hypertension, anemia of chronic disease and failure to thrive. Patient was recently discharged on 1/12 after an admission for altered mentation. Over the past few months the patient has been hospitalized at least once per month and this correlates with patient's request to be more independent at home and become less dependent on his family members including his wife. Patient also has a history of dementia and sometimes forgets to go to dialysis. During the last hospitalization a palliative medicine team consultation was held to establish goals of care. At that time patient wished to continue pursuing dialysis but opted for DO NOT RESUSCITATE status. According to the wife the patient has not missed dialysis until yesterday. For the past several days patient has been having generalized myalgias associated with cough and chills. He's had decreased appetite. He has not had any nausea or vomiting or abdominal pain. No other infectious symptoms reported. No shortness of breath or chest pain. Because of increasing confusion and  inability to ambulate today the wife called EMS to the home and patient was transported to the hospital.  ER Evaluation and treatment: Rectal temperature 100.1, BP 157/41, pulse 89 respirations 13, room air saturations 97%. EKG: Sinus rhythm, ventricular rate 96 bpm, QTC 4 and 22 ms, nonspecific T wave ST segment abnormalities in the lateral leads. Unchanged from previous. Noncontrasted CT of the head: Atrophy and chronic microvascular ischemia unchanged from prior study PCXR: No acute process Laboratory data: CO2 19, AG 19, BUN 80, creatinine 10.61, glucose 199, lactic acid 1.42, WBC 3900, hemoglobin 11.2, platelets 87,000.; Blood cultures obtained in the ER Normal saline IV bolus 1 L Zosyn 2.25 g IV 1 Vancomycin 2000 mg IV 1 Fentanyl 50 g IV 1  Review of Systems   In addition to the HPI above,   No Headache, changes with Vision or hearing, new weakness, tingling, numbness in any extremity, No problems swallowing food or Liquids, indigestion/reflux No Chest pain, Cough or Shortness of Breath, palpitations, orthopnea or DOE No Abdominal pain, N/V; no melena or hematochezia, no dark tarry stools No dysuria, hematuria or flank pain No new skin rashes, lesions, masses or bruises, No recent weight gain or loss No polyuria, polydypsia or polyphagia,  *A full 10 point Review of Systems was done, except as stated above, all other Review of Systems were negative.  Social History Social History  Substance Use Topics  . Smoking status: Never Smoker   . Smokeless tobacco: Never Used  . Alcohol Use: No    Resides at: Private residence  Lives with: Family member  Ambulatory status: Without assistive devices   Family History Family History  Problem Relation Age of Onset  . Sudden death Mother   . Sudden death Father      Prior to Admission medications   Medication Sig Start Date End Date Taking? Authorizing Provider  amLODipine (NORVASC) 10 MG tablet Take 10 mg by mouth Daily.  08/15/12   Historical Provider, MD  calcitRIOL (ROCALTROL) 0.5 MCG capsule Take 2 capsules (1 mcg total) by mouth every Monday, Wednesday, and Friday with hemodialysis. Patient not taking: Reported on 09/05/2015 05/23/15   Cherene Altes, MD  calcium acetate (PHOSLO) 667 MG capsule Take 3 capsules by mouth three times a day Patient taking differently: Take 2,001 mg by mouth. Take 3 capsules by mouth three times a day 07/15/15   Lauree Chandler, NP  cinacalcet (SENSIPAR) 30 MG tablet Take 1 tablet (30 mg total) by mouth daily with supper. 09/18/15   Barton Dubois, MD  colchicine 0.6 MG tablet Take 1 tablet (0.6 mg total) by mouth as needed. For gout. Patient needs an appointment before anymore refills. 09/29/15   [name] L [name], DO  darbepoetin (ARANESP) 60 MCG/0.3ML SOLN injection Inject 0.3 mLs (60 mcg  total) into the vein every Thursday with hemodialysis. Patient taking differently: Inject 60 mcg into the vein every 7 (seven) days.  09/29/13   Geradine Girt, DO  Diphenhydramine-APAP 25-500 MG TABS Take 1 tablet by mouth at bedtime as needed. For sleep    Historical Provider, MD  docusate sodium (COLACE) 100 MG capsule Take 1 capsule (100 mg total) by mouth 2 (two) times daily. 09/18/15   Barton Dubois, MD  folic acid (FOLVITE) 1 MG tablet Take 1 tablet (1 mg total) by mouth daily. 09/29/13   Geradine Girt, DO  gabapentin (NEURONTIN) 100 MG capsule Take 1 capsule (100 mg total) by mouth daily. 05/23/15   Cherene Altes,  MD  HUMALOG KWIKPEN 100 UNIT/ML KiwkPen Inject 0-10 Units into the skin 3 (three) times daily. Sliding Scale 04/16/15   Historical Provider, MD  lacosamide (VIMPAT) 50 MG TABS tablet Take 1 tablet (50 mg total) by mouth every Monday, Wednesday, and Friday with hemodialysis. 07/28/15   Geradine Girt, DO  Lacosamide 100 MG TABS Take 1 tablet (100 mg total) by mouth 2 (two) times daily. Patient taking differently: Take 100 mg by mouth as directed. Take 1 tablet twice a day on NON dialysis days 07/21/15   [name] L [name], DO  lidocaine (LIDODERM) 5 % Place 1 patch onto the skin daily. Remove & Discard patch within 12 hours or as directed by MD 09/18/15   Barton Dubois, MD  multivitamin (RENA-VIT) TABS tablet Take 1 tablet by mouth at bedtime. Patient not taking: Reported on 09/10/2015 09/29/13   Geradine Girt, DO  Nutritional Supplements (FEEDING SUPPLEMENT, NEPRO CARB STEADY,) LIQD Take 237 mLs by mouth 2 (two) times daily between meals. 09/18/15   Barton Dubois, MD  sodium chloride 0.9 % SOLN 100 mL with ferric gluconate 12.5 MG/ML SOLN 62.5 mg Inject 62.5 mg into the vein every Thursday with hemodialysis. 09/29/13   Geradine Girt, DO  traMADol (ULTRAM) 50 MG tablet Take one tablet by mouth every 6 hours as needed for pain. Patient needs appointment before anymore refills. 09/29/15    [name] L [name], DO    Allergies  Allergen Reactions  . Aspirin Other (See Comments)    Bleeding   . Lactose Intolerance (Gi) Diarrhea    Physical Exam  Vitals  Blood pressure 134/44, pulse 96, temperature 100.1 F (37.8 C), temperature source Rectal, resp. rate 16, weight 198 lb 10.2 oz (90.1 kg), SpO2 98 %.   General:  Appears mildly toxic, moaning and complaining of generalized myalgias and is somewhat drowsy  Psych: Flat and drowsy affect, Denies Suicidal or Homicidal ideations, Awake Alert, Oriented X times name-seems to be oriented to place  Neuro:   No focal neurological deficits, CN II through XII intact, Strength 4-5/5 all 4 extremities, Sensation intact all 4 extremities.  ENT:  Ears and Eyes appear Normal, Conjunctivae clear, PER. Right dry oral mucosa without erythema or exudates.  Neck:  Supple, No lymphadenopathy appreciated; right anterior chest wall with tunneled vascular catheter for dialysis-dressing intact and no. Dressing erythema swelling or drainage or odors noted  Respiratory:  Symmetrical chest wall movement, Good air movement bilaterally, CTAB. Room Air  Cardiac:  RRR, No Murmurs, no LE edema noted, no JVD, No carotid bruits, peripheral pulses palpable at 2+  Abdomen:  Positive bowel sounds, Soft, Non tender, Non distended,  No masses appreciated, no obvious hepatosplenomegaly  Skin:  No Cyanosis, Normal Skin Turgor, No Skin Rash or Bruise.  Extremities: Symmetrical without obvious trauma or injury,  no effusions.  Data Review  CBC  Recent Labs Lab 10/23/15 1108  WBC 3.9*  HGB 11.2*  HCT 35.2*  PLT 87*  MCV 99.7  MCH 31.7  MCHC 31.8  RDW 17.5*    Chemistries   Recent Labs Lab 10/23/15 1108  NA 141  K 4.9  CL 103  CO2 19*  GLUCOSE 199*  BUN 80*  CREATININE 10.61*  CALCIUM 10.1  AST 23  ALT 13*  ALKPHOS 46  BILITOT 0.8    estimated creatinine clearance is 6 mL/min (by C-G formula based on Cr of 10.61).  No results for  input(s): TSH, T4TOTAL, T3FREE, THYROIDAB in the last 72  hours.  Invalid input(s): FREET3  Coagulation profile No results for input(s): INR, PROTIME in the last 168 hours.  No results for input(s): DDIMER in the last 72 hours.  Cardiac Enzymes No results for input(s): CKMB, TROPONINI, MYOGLOBIN in the last 168 hours.  Invalid input(s): CK  Invalid input(s): POCBNP  Urinalysis    Component Value Date/Time   COLORURINE YELLOW 09/15/2015 1925   APPEARANCEUR CLEAR 09/15/2015 1925   LABSPEC 1.012 09/15/2015 1925   PHURINE 6.0 09/15/2015 1925   GLUCOSEU 250* 09/15/2015 1925   HGBUR NEGATIVE 09/15/2015 1925   BILIRUBINUR NEGATIVE 09/15/2015 1925   KETONESUR NEGATIVE 09/15/2015 1925   PROTEINUR 100* 09/15/2015 1925   UROBILINOGEN 0.2 06/20/2015 0845   NITRITE NEGATIVE 09/15/2015 1925   LEUKOCYTESUR NEGATIVE 09/15/2015 1925    Imaging results:   Dg Chest 2 View  10/23/2015  CLINICAL DATA:  Altered mental status.  ESRD EXAM: CHEST  2 VIEW COMPARISON:  09/15/2015 FINDINGS: Right jugular dialysis catheter in the right atrium unchanged. Heart size normal. Normal vascularity. No edema or effusion. Negative for pneumonia. IMPRESSION: No active cardiopulmonary disease. Electronically Signed   By: Franchot Gallo M.D.   On: 10/23/2015 10:42   Ct Head Wo Contrast  10/23/2015  CLINICAL DATA:  Altered mental status.  ESRD. EXAM: CT HEAD WITHOUT CONTRAST TECHNIQUE: Contiguous axial images were obtained from the base of the skull through the vertex without intravenous contrast. COMPARISON:  09/15/2015 FINDINGS: Image quality degraded by motion multiple repeat images performed Moderate atrophy. Moderate to advanced chronic microvascular ischemic change throughout the white matter, similar to the prior CT. Negative for acute infarct. Negative for acute hemorrhage or mass lesion. Calvarium intact.  Mild mucosal edema paranasal sinuses. IMPRESSION: Atrophy and chronic microvascular ischemia unchanged  from the prior study. No acute abnormality. Electronically Signed   By: Franchot Gallo M.D.   On: 10/23/2015 10:30     EKG: (Independently reviewed)  Sinus rhythm, ventricular rate 96 bpm, QTC 4 and 22 ms, nonspecific T wave ST segment abnormalities in the lateral leads. Unchanged from previous.   Assessment & Plan  Principal Problem:   Acute encephalopathy -Etiology unclear differential includes infection versus hypoglycemia versus medications versus atypical seizures -Admit to floor/Obs -Hold any potentially sedating medications -ER has begun a sepsis workup and has given empiric antibiotics as we will continue for now -Chek PCT -Follow up on blood cultures -Closely monitor for hypoglycemia (see below) -Check urinalysis and culture as well -Urine drug screen  Active Problems:   ESRD on dialysis  -Missed dialysis on 2/15 -Nephrology notified of admission and plan dialysis tomorrow since not volume overloaded and potassium stable    Seizure disorder  -No obvious evidence of seizure prior to admission -Continue Vimpat    Hypertension -Blood pressure moderately controlled -Continue home meds    Anemia in chronic kidney disease -Hemoglobin stable and at baseline    Thrombocytopenia  -Chronic and platelets stable and at baseline    Diabetes type 2, controlled  -Only on SSI prior to admission -Hemoglobin A1c September 2016 was 5.0 and this is not consistent with the diagnosis of diabetes -Did present with CBG 199 so we'll repeat hemoglobin A1c -Concern patient may be having intermittent nocturnal or early a.m. hypoglycemia so we'll follow CBGs every 4 hours    Failure to thrive in adult/Dementia -Patient does not appear to be doing well with managing his own healthcare and medications -Met with palliative team last admission for goals of care and patient wishes  to continue with dialysis because he is not ready to die  -May need another PT OT evaluation and a long discussion  with patient and family regarding need for more oversight from family members to keep patient out of the hospital-concern patient may require skilled nursing facility    DVT Prophylaxis: Subcutaneous heparin  Family Communication:   Spouse at bedside  Code Status:  DO NOT RESUSCITATE  Condition:  Stable  Discharge disposition: Anticipate discharge back to previous home environment  Time spent in minutes : 60      ELLIS,ALLISON L. ANP on 10/23/2015 at 1:29 PM  You may contact me by going to [email or web address] - password [email or web address]  I am available from 7a-7p but please confirm I am on the schedule by going to Amion as above.   After 7p please contact night coverage person covering me after hours  [facility]

## 2015-10-24 ENCOUNTER — Encounter (HOSPITAL_COMMUNITY): Payer: Self-pay | Admitting: General Practice

## 2015-10-24 DIAGNOSIS — I440 Atrioventricular block, first degree: Secondary | ICD-10-CM | POA: Diagnosis present

## 2015-10-24 DIAGNOSIS — Y838 Other surgical procedures as the cause of abnormal reaction of the patient, or of later complication, without mention of misadventure at the time of the procedure: Secondary | ICD-10-CM | POA: Diagnosis present

## 2015-10-24 DIAGNOSIS — Z4901 Encounter for fitting and adjustment of extracorporeal dialysis catheter: Secondary | ICD-10-CM | POA: Diagnosis not present

## 2015-10-24 DIAGNOSIS — Z794 Long term (current) use of insulin: Secondary | ICD-10-CM | POA: Diagnosis not present

## 2015-10-24 DIAGNOSIS — G9340 Encephalopathy, unspecified: Secondary | ICD-10-CM | POA: Diagnosis not present

## 2015-10-24 DIAGNOSIS — A4150 Gram-negative sepsis, unspecified: Secondary | ICD-10-CM | POA: Diagnosis present

## 2015-10-24 DIAGNOSIS — I5030 Unspecified diastolic (congestive) heart failure: Secondary | ICD-10-CM | POA: Diagnosis present

## 2015-10-24 DIAGNOSIS — Z515 Encounter for palliative care: Secondary | ICD-10-CM | POA: Diagnosis not present

## 2015-10-24 DIAGNOSIS — G4730 Sleep apnea, unspecified: Secondary | ICD-10-CM | POA: Diagnosis present

## 2015-10-24 DIAGNOSIS — I120 Hypertensive chronic kidney disease with stage 5 chronic kidney disease or end stage renal disease: Secondary | ICD-10-CM | POA: Diagnosis not present

## 2015-10-24 DIAGNOSIS — D696 Thrombocytopenia, unspecified: Secondary | ICD-10-CM | POA: Diagnosis present

## 2015-10-24 DIAGNOSIS — T827XXA Infection and inflammatory reaction due to other cardiac and vascular devices, implants and grafts, initial encounter: Secondary | ICD-10-CM | POA: Diagnosis present

## 2015-10-24 DIAGNOSIS — R001 Bradycardia, unspecified: Secondary | ICD-10-CM | POA: Diagnosis present

## 2015-10-24 DIAGNOSIS — R4182 Altered mental status, unspecified: Secondary | ICD-10-CM | POA: Diagnosis present

## 2015-10-24 DIAGNOSIS — M545 Low back pain: Secondary | ICD-10-CM | POA: Diagnosis not present

## 2015-10-24 DIAGNOSIS — Z91018 Allergy to other foods: Secondary | ICD-10-CM | POA: Diagnosis not present

## 2015-10-24 DIAGNOSIS — E8889 Other specified metabolic disorders: Secondary | ICD-10-CM | POA: Diagnosis present

## 2015-10-24 DIAGNOSIS — N2581 Secondary hyperparathyroidism of renal origin: Secondary | ICD-10-CM | POA: Diagnosis present

## 2015-10-24 DIAGNOSIS — G40909 Epilepsy, unspecified, not intractable, without status epilepticus: Secondary | ICD-10-CM | POA: Diagnosis present

## 2015-10-24 DIAGNOSIS — G92 Toxic encephalopathy: Secondary | ICD-10-CM | POA: Diagnosis present

## 2015-10-24 DIAGNOSIS — I272 Other secondary pulmonary hypertension: Secondary | ICD-10-CM | POA: Diagnosis present

## 2015-10-24 DIAGNOSIS — G8929 Other chronic pain: Secondary | ICD-10-CM | POA: Diagnosis not present

## 2015-10-24 DIAGNOSIS — Z992 Dependence on renal dialysis: Secondary | ICD-10-CM | POA: Diagnosis not present

## 2015-10-24 DIAGNOSIS — Z7189 Other specified counseling: Secondary | ICD-10-CM | POA: Diagnosis not present

## 2015-10-24 DIAGNOSIS — E1140 Type 2 diabetes mellitus with diabetic neuropathy, unspecified: Secondary | ICD-10-CM | POA: Diagnosis present

## 2015-10-24 DIAGNOSIS — F0390 Unspecified dementia without behavioral disturbance: Secondary | ICD-10-CM | POA: Diagnosis present

## 2015-10-24 DIAGNOSIS — Z79899 Other long term (current) drug therapy: Secondary | ICD-10-CM | POA: Diagnosis not present

## 2015-10-24 DIAGNOSIS — Z66 Do not resuscitate: Secondary | ICD-10-CM | POA: Diagnosis present

## 2015-10-24 DIAGNOSIS — N186 End stage renal disease: Secondary | ICD-10-CM | POA: Diagnosis not present

## 2015-10-24 DIAGNOSIS — D631 Anemia in chronic kidney disease: Secondary | ICD-10-CM | POA: Diagnosis not present

## 2015-10-24 DIAGNOSIS — Z886 Allergy status to analgesic agent status: Secondary | ICD-10-CM | POA: Diagnosis not present

## 2015-10-24 DIAGNOSIS — E1122 Type 2 diabetes mellitus with diabetic chronic kidney disease: Secondary | ICD-10-CM | POA: Diagnosis present

## 2015-10-24 DIAGNOSIS — R627 Adult failure to thrive: Secondary | ICD-10-CM | POA: Diagnosis present

## 2015-10-24 LAB — RENAL FUNCTION PANEL
ALBUMIN: 3.1 g/dL — AB (ref 3.5–5.0)
ANION GAP: 17 — AB (ref 5–15)
BUN: 44 mg/dL — AB (ref 6–20)
CHLORIDE: 103 mmol/L (ref 101–111)
CO2: 22 mmol/L (ref 22–32)
Calcium: 8.5 mg/dL — ABNORMAL LOW (ref 8.9–10.3)
Creatinine, Ser: 7.47 mg/dL — ABNORMAL HIGH (ref 0.61–1.24)
GFR, EST AFRICAN AMERICAN: 7 mL/min — AB (ref >60)
GFR, EST NON AFRICAN AMERICAN: 6 mL/min — AB (ref >60)
Glucose, Bld: 198 mg/dL — ABNORMAL HIGH (ref 65–99)
PHOSPHORUS: 5.6 mg/dL — AB (ref 2.5–4.6)
POTASSIUM: 4.2 mmol/L (ref 3.5–5.1)
Sodium: 142 mmol/L (ref 135–145)

## 2015-10-24 LAB — URINE CULTURE
CULTURE: NO GROWTH
SPECIAL REQUESTS: NORMAL

## 2015-10-24 LAB — CBC
HCT: 30.3 % — ABNORMAL LOW (ref 39.0–52.0)
HEMATOCRIT: 31.1 % — AB (ref 39.0–52.0)
HEMOGLOBIN: 10 g/dL — AB (ref 13.0–17.0)
Hemoglobin: 10 g/dL — ABNORMAL LOW (ref 13.0–17.0)
MCH: 31.9 pg (ref 26.0–34.0)
MCH: 32.8 pg (ref 26.0–34.0)
MCHC: 32.2 g/dL (ref 30.0–36.0)
MCHC: 33 g/dL (ref 30.0–36.0)
MCV: 99.4 fL (ref 78.0–100.0)
MCV: 99.3 fL (ref 78.0–100.0)
Platelets: 50 10*3/uL — ABNORMAL LOW (ref 150–400)
Platelets: 54 K/uL — ABNORMAL LOW (ref 150–400)
RBC: 3.13 MIL/uL — AB (ref 4.22–5.81)
RBC: 3.05 MIL/uL — ABNORMAL LOW (ref 4.22–5.81)
RDW: 17.3 % — AB (ref 11.5–15.5)
RDW: 17.4 % — ABNORMAL HIGH (ref 11.5–15.5)
WBC: 3.6 10*3/uL — AB (ref 4.0–10.5)
WBC: 5.2 K/uL (ref 4.0–10.5)

## 2015-10-24 LAB — HEMOGLOBIN A1C
HEMOGLOBIN A1C: 5.3 % (ref 4.8–5.6)
MEAN PLASMA GLUCOSE: 105 mg/dL

## 2015-10-24 LAB — GLUCOSE, CAPILLARY
GLUCOSE-CAPILLARY: 146 mg/dL — AB (ref 65–99)
GLUCOSE-CAPILLARY: 150 mg/dL — AB (ref 65–99)
Glucose-Capillary: 151 mg/dL — ABNORMAL HIGH (ref 65–99)
Glucose-Capillary: 185 mg/dL — ABNORMAL HIGH (ref 65–99)

## 2015-10-24 MED ORDER — SODIUM CHLORIDE 0.9 % IV SOLN
80.0000 mg | Freq: Once | INTRAVENOUS | Status: AC
  Administered 2015-10-24: 80 mg via INTRAVENOUS
  Filled 2015-10-24: qty 80

## 2015-10-24 MED ORDER — PENTAFLUOROPROP-TETRAFLUOROETH EX AERO: 1.0000 "application " | INHALATION_SPRAY | CUTANEOUS | Status: DC | PRN

## 2015-10-24 MED ORDER — PANTOPRAZOLE SODIUM 40 MG IV SOLR
40.0000 mg | Freq: Two times a day (BID) | INTRAVENOUS | Status: DC
Start: 2015-10-27 — End: 2015-10-25

## 2015-10-24 MED ORDER — HEPARIN SODIUM (PORCINE) 1000 UNIT/ML DIALYSIS
1000.0000 [IU] | INTRAMUSCULAR | Status: DC | PRN
  Filled 2015-10-24: qty 1

## 2015-10-24 MED ORDER — ALTEPLASE 2 MG IJ SOLR
2.0000 mg | Freq: Once | INTRAMUSCULAR | Status: DC | PRN
  Filled 2015-10-24: qty 2

## 2015-10-24 MED ORDER — SODIUM CHLORIDE 0.9 % IV SOLN: 100.0000 mL | INTRAVENOUS | Status: DC | PRN

## 2015-10-24 MED ORDER — LIDOCAINE-PRILOCAINE 2.5-2.5 % EX CREA
1.0000 "application " | TOPICAL_CREAM | CUTANEOUS | Status: DC | PRN
  Filled 2015-10-24: qty 5

## 2015-10-24 MED ORDER — SODIUM CHLORIDE 0.9 % IV SOLN
8.0000 mg/h | INTRAVENOUS | Status: DC
  Administered 2015-10-24 – 2015-10-25 (×4): 8 mg/h via INTRAVENOUS
  Filled 2015-10-24 (×9): qty 80

## 2015-10-24 MED ORDER — LIDOCAINE HCL (PF) 1 % IJ SOLN: 5.0000 mL | INTRAMUSCULAR | Status: DC | PRN

## 2015-10-24 MED ORDER — INSULIN ASPART 100 UNIT/ML ~~LOC~~ SOLN
0.0000 [IU] | SUBCUTANEOUS | Status: DC
  Administered 2015-10-24: 2 [IU] via SUBCUTANEOUS
  Administered 2015-10-24: 1 [IU] via SUBCUTANEOUS
  Administered 2015-10-24: 2 [IU] via SUBCUTANEOUS
  Administered 2015-10-25: 5 [IU] via SUBCUTANEOUS
  Administered 2015-10-25: 3 [IU] via SUBCUTANEOUS
  Administered 2015-10-25: 2 [IU] via SUBCUTANEOUS
  Administered 2015-10-26: 3 [IU] via SUBCUTANEOUS
  Administered 2015-10-26: 5 [IU] via SUBCUTANEOUS
  Administered 2015-10-26 (×2): 2 [IU] via SUBCUTANEOUS
  Administered 2015-10-26: 1 [IU] via SUBCUTANEOUS
  Administered 2015-10-26 – 2015-10-27 (×2): 2 [IU] via SUBCUTANEOUS

## 2015-10-24 NOTE — Progress Notes (Signed)
Inpatient Diabetes Program Recommendations  AACE/ADA: New Consensus Statement on Inpatient Glycemic Control (2015)  Target Ranges:  Prepandial:   less than 140 mg/dL      Peak postprandial:   less than 180 mg/dL (1-2 hours)      Critically ill patients:  140 - 180 mg/dL   Results for [name], [name] (MRN [id number]) as of 10/24/2015 09:33  Ref. Range 10/23/2015 14:15  Hemoglobin A1C Latest Ref Range: 4.8-5.6 % 5.3   Results for [name], [name] (MRN [id number]) as of 10/24/2015 09:33  Ref. Range 10/23/2015 09:51 10/23/2015 23:33  Glucose-Capillary Latest Ref Range: 65-99 mg/dL 209 (H) 470 (H)    Admit with: AMS  History: DM, ESRD, Dementia  Home DM Meds: Humalog 0-10 units tid per SSI  Current Insulin Orders: Novolog Sensitive SSI (0-9 units) Q4 hours     -Spoke with patient and his wife this AM.  Patient was able to tell me that he checks his CBGs tid at home and doses his Humalog according to the scale that Dr. Renato Gails prescribed him (Dr. Renato Gails with [facility]).  Patient told me he had to change his insulin from Novolog to Humalog and he had to change CBG meters b/c of insurance reasons.  Seemed to appear to know how to dose and use his insulin per verbal report.  -Discussed Hypoglycemia signs/symptoms with patient and wife and appropriate Hypoglycemia treatment at home.  Encouraged patient to buy juice boxes and/or glucose tabs to keep at home and to take with him to dialysis.  Wife wrote down this information while I spoke with them both.  -Per RN, patient was very confused last night.  Much more appropriate today.      --Will follow patient during hospitalization--  Ambrose Finland RN, MSN, CDE Diabetes Coordinator Inpatient Glycemic Control Team Team Pager: [phone] (8a-5p)

## 2015-10-24 NOTE — Progress Notes (Signed)
[name] QQV:[id number] DOB: [birth year] DOA: 10/23/2015 PCP: [name], DO  Brief narrative: 83 year old male Known end-stage renal disease Monday Wednesday Friday-still passes urine 3-4 times a day--has a history of intermittent noncompliance Hypertension Bradycardia, junctional-first-degree AV block without any conduction system anomaly noted by EP and not a candidate for pacemaker Diastolic heart failure grade 1 based on TTE 01/14/13 EF 55-60% P ASP 71 History subdural hematoma 9/16 right sided subdural  07/26/15 left-sided subdural secondary to recurrent falls-no midline shift Anemia Diabetes mellitus2  Past medical history-As per Problem list Chart reviewed as below-   Consultants:  Nephrology  Palliative care  Procedures:  None  Antibiotics: Zosyn Vancomycin    Subjective  Confused but arousable. Only oriented to place this being [facility] Cannot tell me the year Review of systems is unremarkable    Objective    Interim History:   Telemetry: Sinus rhythm   Objective: Filed Vitals:   10/23/15 2320 10/24/15 0026 10/24/15 0500 10/24/15 0502  BP: 134/72 97/37  100/40  Pulse: 109 108  102  Temp: 98.6 F (37 C) 98 F (36.7 C)  98.2 F (36.8 C)  TempSrc:  Oral  Oral  Resp: 16 17  19   Height: 6' (1.829 m)     Weight: 87.6 kg (193 lb 2 oz)  87.7 kg (193 lb 5.5 oz)   SpO2: 100% 96%  97%    Intake/Output Summary (Last 24 hours) at 10/24/15 10/26/15 Last data filed at 10/24/15 10/26/15  Gross per 24 hour  Intake   1220 ml  Output    852 ml  Net    368 ml    Exam:  General: EOMI NCAT well-nourished Cardiovascular: S1-S2 no murmur rub or gallop slightly bradycardic Respiratory: Clinically clear no added sound Abdomen: Abdomen soft nontender nondistended no rebound or guarding Skin no lower extremity edema however excoriations bilaterally what appeared to be skin flaps? Neuro power is 5/5  Data Reviewed: Basic Metabolic Panel:  Recent  Labs Lab 10/23/15 1108 10/24/15 0717  NA 141 142  K 4.9 4.2  CL 103 103  CO2 19* 22  GLUCOSE 199* 198*  BUN 80* 44*  CREATININE 10.61* 7.47*  CALCIUM 10.1 8.5*  PHOS  --  5.6*   Liver Function Tests:  Recent Labs Lab 10/23/15 1108 10/24/15 0717  AST 23  --   ALT 13*  --   ALKPHOS 46  --   BILITOT 0.8  --   PROT 7.4  --   ALBUMIN 4.1 3.1*   No results for input(s): LIPASE, AMYLASE in the last 168 hours. No results for input(s): AMMONIA in the last 168 hours. CBC:  Recent Labs Lab 10/23/15 1108 10/24/15 0120 10/24/15 0717  WBC 3.9* 3.6* 5.2  HGB 11.2* 10.0* 10.0*  HCT 35.2* 31.1* 30.3*  MCV 99.7 99.4 99.3  PLT 87* 50* 54*   Cardiac Enzymes: No results for input(s): CKTOTAL, CKMB, CKMBINDEX, TROPONINI in the last 168 hours. BNP: Invalid input(s): POCBNP CBG:  Recent Labs Lab 10/23/15 0951 10/23/15 2333  GLUCAP 178* 146*    Recent Results (from the past 240 hour(s))  Culture, blood (routine x 2)     Status: None (Preliminary result)   Collection Time: 10/23/15 10:58 AM  Result Value Ref Range Status   Specimen Description BLOOD RIGHT FOREARM  Final   Special Requests BOTTLES DRAWN AEROBIC ONLY 5CC  Final   Culture  Setup Time   Final    GRAM NEGATIVE RODS AEROBIC BOTTLE ONLY  CRITICAL RESULT CALLED TO, READ BACK BY AND VERIFIED WITH: J BOZEMAN 10/24/15 @ 0949 M VESTAL    Culture GRAM NEGATIVE RODS  Final   Report Status PENDING  Incomplete     Studies:              All Imaging reviewed and is as per above notation   Scheduled Meds: . amLODipine  10 mg Oral Daily  . calcitRIOL  0.75 mcg Oral Q M,W,F-HD  . calcium acetate  2,001 mg Oral TID WC  . cinacalcet  30 mg Oral Q supper  . docusate sodium  100 mg Oral BID  . feeding supplement (NEPRO CARB STEADY)  237 mL Oral BID BM  . [START ON 10/29/2015] ferric gluconate (FERRLECIT/NULECIT) IV  62.5 mg Intravenous Q Wed-HD  . folic acid  1 mg Oral Daily  . gabapentin  100 mg Oral Daily  . insulin  aspart  0-9 Units Subcutaneous 6 times per day  . lacosamide  100 mg Oral BID  . lacosamide  50 mg Oral Q M,W,F-HD  . [START ON 10/27/2015] pantoprazole (PROTONIX) IV  40 mg Intravenous Q12H  . piperacillin-tazobactam (ZOSYN)  IV  2.25 g Intravenous 3 times per day  . vancomycin  1,000 mg Intravenous Q M,W,F-HD   Continuous Infusions: . pantoprozole (PROTONIX) infusion 8 mg/hr (10/24/15 0133)     Assessment/Plan:  1. Probable sepsis in the setting of fever tmax 100 and elevated procalcitonin. One blood culture showing gram-negative rods. Narrow off of vancomycin.  We'll give Zosyn per pharmacy consult and suspect metabolic encephalopathy is multifactorial secondary to sepsis and under dialysis 2. ? GI bleed, history colonoscopy 07/2006, 08/2007-EGD = esophagitis, colonoscopies showed diverticula and capsule 2008 negative for AVM suspicious bleeds--patient had 200 cc of coffee ground emesis and was placed on Protonix gtt while on dialysis. Keep nothing by mouth for now-may consult GI when we can chat  3. Toxic metabolic encephalopathy-probably secondary to #1 4. Prior subdural hemorrhages with seizures-may acunt for altered mentation. WIll d/w family. For now continue Vimpat 100 twice a day as well as 50 mg Monday Wednesday Friday 5. Hypertension-Cut back amlodipine 10 mg and do not administer given hypotension earlier this admission 6. Diabetes mellitus with diabetic neuropathy/nephropathy-continue gabapentin 100 daily 7. ESRD MWF-we will need to determine if patient is a viable dialysis candidate ongoing. Seems to sign off early and has had 5 admissions recently with significant decline based on notes from subdural hemorrhages. I have contacted palliative care to see the patient and will speak to family regarding my thoughts as to recent infection and GI bleeding in addition to the ESRD. I do think patient is hospice appropriate 8. Metabolic bone disease 9. Anemia of renal disease-getting IV  iron with dialysis    Long discussion with wife who states that she will need to discuss these issues with her daughter who is in [place] and will also need to consider gastroenterology workup. I have offered that we can consult gastroenterology however it in the granddaughter scope of things I do not know if long term disability huge difference.  Addendum-I discussed with Dr. Elnoria Howard in GI who suggests noninvasive and supportive approach with PPI infusion and recommends that we call him if gross bleeding. We will await palliative care input  Pleas Koch, MD  [facility] Pager [phone] 10/24/2015, 9:52 AM

## 2015-10-24 NOTE — Progress Notes (Signed)
[facility] Rounding Note  Subjective:  Got very agitated this AM when he couldn't figure out how to use the phone and because his wife was not around  Had HD yesterday  Catheter was used instead of AVG but I can't discern why  System clotted and while stringing a new one, had 200 cc coffee ground emesis so treatment was not restarted Was also having chills but no fever  No more vomiting Today at least is oriented to person and place, says "I'm here because I was out of it"   Objective Vital signs in last 24 hours: Filed Vitals:   10/23/15 2320 10/24/15 0026 10/24/15 0500 10/24/15 0502  BP: 134/72 97/37  100/40  Pulse: 109 108  102  Temp: 98.6 F (37 C) 98 F (36.7 C)  98.2 F (36.8 C)  TempSrc:  Oral  Oral  Resp: 16 17  19   Height: 6' (1.829 m)     Weight: 87.6 kg (193 lb 2 oz)  87.7 kg (193 lb 5.5 oz)   SpO2: 100% 96%  97%   Weight change:   Intake/Output Summary (Last 24 hours) at 10/24/15 1135 Last data filed at 10/24/15 0900  Gross per 24 hour  Intake    220 ml  Output    852 ml  Net   -632 ml   Physical Exam:  BP 100/40 mmHg  Pulse 102  Temp(Src) 98.2 F (36.8 C) (Oral)  Resp 19  Ht 6' (1.829 m)  Wt 87.7 kg (193 lb 5.5 oz)  BMI 26.22 kg/m2  SpO2 97%   General: Elderly AAM shouting and holding the phone - easily calmed after we dialed the number for him Head: HOH (right ear is best) Neck: Supple. JVD not elevated. Lungs: Clear bilaterally to auscultation without wheezes, rales, or rhonchi.  Heart: RRR with S1 S2. No murmurs, rubs, or gallops appreciated. Abdomen: Soft, non-tender, non-distended with normoactive bowel sounds. No rebound/guarding. No obvious abdominal masses. M-S: Strength and tone appear normal for age. Lower extremities:without edema or ischemic changes, no open wounds  Neuro: Awake - Oriented to place- remembers his phone # Dialysis Access: right IJ TDC stie not tender and left upper AVGG + bruit well healed   Labs: Basic Metabolic  Panel:  Recent Labs Lab 10/23/15 1108 10/24/15 0717  NA 141 142  K 4.9 4.2  CL 103 103  CO2 19* 22  GLUCOSE 199* 198*  BUN 80* 44*  CREATININE 10.61* 7.47*  CALCIUM 10.1 8.5*  PHOS  --  5.6*   Liver Function Tests:  Recent Labs Lab 10/23/15 1108 10/24/15 0717  AST 23  --   ALT 13*  --   ALKPHOS 46  --   BILITOT 0.8  --   PROT 7.4  --   ALBUMIN 4.1 3.1*     Recent Labs Lab 10/23/15 1108 10/24/15 0120 10/24/15 0717  WBC 3.9* 3.6* 5.2  HGB 11.2* 10.0* 10.0*  HCT 35.2* 31.1* 30.3*  MCV 99.7 99.4 99.3  PLT 87* 50* 54*    Recent Labs Lab 10/23/15 0951 10/23/15 2333  GLUCAP 178* 146*    Studies/Results: Dg Chest 2 View  10/23/2015  CLINICAL DATA:  Altered mental status.  ESRD EXAM: CHEST  2 VIEW COMPARISON:  09/15/2015 FINDINGS: Right jugular dialysis catheter in the right atrium unchanged. Heart size normal. Normal vascularity. No edema or effusion. Negative for pneumonia. IMPRESSION: No active cardiopulmonary disease. Electronically Signed   By: 11/13/2015 M.D.   On: 10/23/2015 10:42  Ct Head Wo Contrast  10/23/2015  CLINICAL DATA:  Altered mental status.  ESRD. EXAM: CT HEAD WITHOUT CONTRAST TECHNIQUE: Contiguous axial images were obtained from the base of the skull through the vertex without intravenous contrast. COMPARISON:  09/15/2015 FINDINGS: Image quality degraded by motion multiple repeat images performed Moderate atrophy. Moderate to advanced chronic microvascular ischemic change throughout the white matter, similar to the prior CT. Negative for acute infarct. Negative for acute hemorrhage or mass lesion. Calvarium intact.  Mild mucosal edema paranasal sinuses. IMPRESSION: Atrophy and chronic microvascular ischemia unchanged from the prior study. No acute abnormality. Electronically Signed   By: Marlan Palau M.D.   On: 10/23/2015 10:30   Medications: . pantoprozole (PROTONIX) infusion 8 mg/hr (10/24/15 0133)   . calcitRIOL  0.75 mcg Oral Q  M,W,F-HD  . calcium acetate  2,001 mg Oral TID WC  . cinacalcet  30 mg Oral Q supper  . docusate sodium  100 mg Oral BID  . feeding supplement (NEPRO CARB STEADY)  237 mL Oral BID BM  . [START ON 10/29/2015] ferric gluconate (FERRLECIT/NULECIT) IV  62.5 mg Intravenous Q Wed-HD  . folic acid  1 mg Oral Daily  . gabapentin  100 mg Oral Daily  . insulin aspart  0-9 Units Subcutaneous 6 times per day  . lacosamide  100 mg Oral BID  . lacosamide  50 mg Oral Q M,W,F-HD  . [START ON 10/27/2015] pantoprazole (PROTONIX) IV  40 mg Intravenous Q12H  . piperacillin-tazobactam (ZOSYN)  IV  2.25 g Intravenous 3 times per day   Dialysis Orders: Center: [facility] MWF - 4 hr EDW 89.5 2K 2.25 Ca heparin none venofer 50 per week mircera 200 q 2 weeks - last 2/8 calcitriiol 0.75 right IJ and left upper AVGG maturing - pt won't use until he gets numbing med- it is ready to use Labs; Most recent hgb 9.5 40% sat iPTH 548 - last HD 2/13 - left below edw with net UF 2.5 -still on no heparin HD due to SDH hx   Background 82 y.o. male with ESRD on MWF HD with history of multiple medical issues including prior SDH 05/2015. He had a recent admission last month with AMS with no overt explanation. According to dialysis records and confirmed by phone call to the dialysis unit, his last dialysis was Monday, went to dialysis Wednesday, but sat in the lobby, refused to go it and has his wife called to pick him up. Told the dialysis staff that he just didn't feel right; he was very nonspecific. Today his wife tells me he did dialysis Wednesday and perhaps she thought he did. When I asked his wife why he was here today she said he fell at home and then said, maybe it was yesterday, no maybe today. I asked her if he was able to get off the floor by himself and she said she helped and also called EMS. She tells me she drives him to and from dialysis and he has been getting home PT for deconditioning.   According to the admission H and  P, EMS was called due to pt's inability to ambulate and increasing confusion. Pt unable to articulate why he is here. Evaluation in the ED showed neg CXR, Head CT neg for acute changes. WBC 3.9 BUN 80 and Cr 10.6 K CO2 19 K 4.9 He received 1 L IVF in the ED temp was 100.1 BP was not low.EKG negative   Assessment/Plan: 1. Acute encephalopathy - work up  per primary. Underlying dementia tipped over by probable sepsis 2. Probable underlying dementia 3. Failure to thrive 4. Probable sepsis - GNR in 1 BC. Getting zosyn. Vanco stopped. Worry that catheter source. AVG not used for HD yesterday and I am unable to discern why not. If we can use the AVG can get the [facility] out.  5. ESRD - MWF - had HD off schedule yesterday and will HD on Saturday, then get back on schedule on Monday. AVGG placed 1/3 - it was not used for HD yesterday (and I am investigating why not). Has good bruit.  6. Hypertension/volume - variable - getting +/- edw - when feeling better, need to be certain he is NOT orthostatic as he ambulates on his own around the house; no overt volume BP in usual range 7. Coffee ground emesis - now on protonix drip. Hb 11.2->10. Follow carefully. Does not get heparin with HD (prior SDH) 8. Anemia - Due for redose ESA 2/24 - follow trends  9. Metabolic bone disease - Ca 10.1 alb 4.1 - continue same /sensipar/phoslo - hold calcitriol until back on MWF schedule - Ca 10.1 - resume on MWF schedule 10. Nutrition - renal diet/vit/supplements alb 4.1 7. Seizure disorder - on Vimpat 8. DNR - pt has some degree of dementia as does I think his wife- at the very least she has short term memory issues; I think this is very difficult for them as they are quite highly educated. Primary team very appropriately is discussion option of Palliative Care evaluation (his Medical Director Dr. Marisue Humble feels he has been making very poor decisions with regards to his dialysis (signing off, etc), doing less well overall and  he would agree with this evaluation as well).  Camille Bal, MD [facility] [phone] pager 10/24/2015, 11:35 AM

## 2015-10-24 NOTE — Progress Notes (Signed)
CRITICAL VALUE ALERT  Critical value received: Aerobic BC on 10/23/15 growing gram(-) rods) Date of notification:  10/24/15 Time of notification:  0949 Critical value read back:yes Nurse who received alert: Caswell Corwin, RN MD notified (1st page): Samtani Time of first page:  1003 MD notified (2nd page):  Time of second page: n/a  Responding MD:  Mahala Menghini Time MD responded: 1003

## 2015-10-24 NOTE — Care Management (Signed)
Patient was active with [facility] prior to admission with HHRN,PT,OT,SW and aide. If needed at discharge will need new orders to resume care. Thanks Ronny Flurry RN BSN [phone]

## 2015-10-24 NOTE — Progress Notes (Signed)
CRITICAL VALUE ALERT  Critical value received:Gram(-) rods in aerobic and anarobic bottles in left leg Date of notification: 10/24/15 Time of notification: 1055 Critical value read back:yes Nurse who received alert:  Caswell Corwin, RN MD notified (1st page): Samtani Time of first page:  1100 MD notified (2nd page):  Time of second page:  Responding MD:  Mahala Menghini Time MD responded:  1100

## 2015-10-25 ENCOUNTER — Inpatient Hospital Stay (HOSPITAL_COMMUNITY): Payer: Medicare Other

## 2015-10-25 LAB — CBC
HCT: 31 % — ABNORMAL LOW (ref 39.0–52.0)
HEMOGLOBIN: 9.9 g/dL — AB (ref 13.0–17.0)
MCH: 31.9 pg (ref 26.0–34.0)
MCHC: 31.9 g/dL (ref 30.0–36.0)
MCV: 100 fL (ref 78.0–100.0)
Platelets: 41 10*3/uL — ABNORMAL LOW (ref 150–400)
RBC: 3.1 MIL/uL — AB (ref 4.22–5.81)
RDW: 16.7 % — ABNORMAL HIGH (ref 11.5–15.5)
WBC: 3.1 10*3/uL — ABNORMAL LOW (ref 4.0–10.5)

## 2015-10-25 LAB — GLUCOSE, CAPILLARY
GLUCOSE-CAPILLARY: 154 mg/dL — AB (ref 65–99)
GLUCOSE-CAPILLARY: 160 mg/dL — AB (ref 65–99)
GLUCOSE-CAPILLARY: 220 mg/dL — AB (ref 65–99)
GLUCOSE-CAPILLARY: 239 mg/dL — AB (ref 65–99)
GLUCOSE-CAPILLARY: 252 mg/dL — AB (ref 65–99)
GLUCOSE-CAPILLARY: 97 mg/dL (ref 65–99)

## 2015-10-25 LAB — PROCALCITONIN: PROCALCITONIN: 135.29 ng/mL

## 2015-10-25 MED ORDER — CALCITRIOL 0.25 MCG PO CAPS
ORAL_CAPSULE | ORAL | Status: AC
  Filled 2015-10-25: qty 1

## 2015-10-25 MED ORDER — TRAMADOL HCL 50 MG PO TABS
ORAL_TABLET | ORAL | Status: AC
  Filled 2015-10-25: qty 1

## 2015-10-25 MED ORDER — LACOSAMIDE 50 MG PO TABS
50.0000 mg | ORAL_TABLET | Freq: Once | ORAL | Status: AC
Start: 2015-10-25 — End: 2015-10-25
  Administered 2015-10-25: 50 mg via ORAL
  Filled 2015-10-25: qty 1

## 2015-10-25 MED ORDER — DEXTROSE 5 % IV SOLN
2.0000 g | Freq: Once | INTRAVENOUS | Status: AC
  Administered 2015-10-25: 2 g via INTRAVENOUS
  Filled 2015-10-25: qty 2

## 2015-10-25 MED ORDER — LIDOCAINE HCL 1 % IJ SOLN
INTRAMUSCULAR | Status: AC
  Filled 2015-10-25: qty 20

## 2015-10-25 MED ORDER — DEXTROSE 5 % IV SOLN
2.0000 g | INTRAVENOUS | Status: DC
  Filled 2015-10-25 (×2): qty 2

## 2015-10-25 MED ORDER — CALCITRIOL 0.5 MCG PO CAPS
ORAL_CAPSULE | ORAL | Status: AC
  Filled 2015-10-25: qty 1

## 2015-10-25 MED ORDER — PANTOPRAZOLE SODIUM 40 MG PO TBEC
40.0000 mg | DELAYED_RELEASE_TABLET | Freq: Two times a day (BID) | ORAL | Status: DC
  Administered 2015-10-25 – 2015-10-27 (×4): 40 mg via ORAL
  Filled 2015-10-25 (×4): qty 1

## 2015-10-25 MED ORDER — CHLORHEXIDINE GLUCONATE 4 % EX LIQD
CUTANEOUS | Status: AC
  Filled 2015-10-25: qty 15

## 2015-10-25 NOTE — Consult Note (Signed)
Consultation Note Date: 10/25/2015   Patient Name: [name]  DOB: 19-Dec-1931  MRN: [id number]  Age / Sex: 82 y.o., male  PCP: Kermit Balo, DO Referring Physician: Rhetta Mura, MD  82 yo male with mild - mod dementia, ESRD, Seizures, DM (although having trouble with hypoglycemia),a subdural hematoma in 05/2015, and frequent hospital admissions over the last 6 months.  Presented with myalgias, cough chills, decreased appetite, inability to walk and confusion.  He was found to have pseudomonas in 2 of 2 blood cultures.  Sensitivities are still pending.   Reason for Consultation: Establishing goals of care    Clinical Assessment/Narrative:  The [facility] family was originally seen by Palliative in January 2017 by Yong Channel.  At that time the patient elected to be DNR.  He confirms that election today.  Previously the patient had an issue with not going to HD or signing off early due to back pain.  The patient reports that this has improved and he is now tolerating HD.  He describes gas pain prior to BMs but no other pain.  His wife reports he is eating well and that he ambulates around the house taking care of his own ADLs.  They have a cleaning service, and his wife has been able to cook, but reportedly their son and daughter have been telling them to have home health services come to the house.  Per Epic, nephrology notes indicate that he may not be able to take his pain medications for HD as his blood pressure has been dropping.  In addition he had an episode of coffee ground emesis on 2/16 just after HD.  While the patient is willing to take HD there is a question of whether or not he will be able to tolerate it in the near future.  I recommended home hospice services to the patient and his wife.   I will attempt to call the patient's son if they are still in house on Monday.   Contacts/Participants in  Discussion: Patient and his wife Primary Decision Maker: patient. HCPOA:  Son, Jami Ohlin.  SUMMARY OF RECOMMENDATIONS  Code Status/Advance Care Planning: DNR  Symptom Management:   Pain - being treated with tramadol.  May consider fentanyl (transdermal or PO) as it may be less likely to lower his blood pressure and is safe to use in ESRD.   Additional Recommendations (Limitations, Scope, Preferences):  Full Scope Treatment   I recommend home hospice services.  Patient's wife agrees to investigate them.   Psycho-social/Spiritual:  Support System: Adequate Desire for further Chaplaincy support   Prognosis:  Less than 6 months.  Secondary to dementia, back pain, decreased tolerance of hemo dialysis, and acute gram negative bacteremia.  Discharge Planning: Home with Home Health vs Home with home health services   Chief Complaint/ Primary Diagnoses: Present on Admission:  . (Resolved) Altered mental state . Failure to thrive in adult . Acute encephalopathy . Thrombocytopenia (HCC) . Anemia in chronic kidney disease . Hypertension . Dementia . Metabolic encephalopathy  I have reviewed the medical record, interviewed the patient and family, and examined the patient. The following aspects are pertinent.  Past Medical History  Diagnosis Date  . Hypertension   . Diabetes mellitus without complication (HCC)   . Arthritis   . History of blood transfusion   . GI bleed   . ESRD (end stage renal disease) (HCC)   . Seizure (HCC)   . Dialysis patient (HCC)   . Subdural hematoma (HCC)  05/2015    from fall  . Sleep apnea     wears CPAP n/c  . Pulmonary hypertension (HCC)     severe pulmonary HTN by 01/14/13 echo   Social History   Social History  . Marital Status: Married    Spouse Name: patricia  . Number of Children: 2  . Years of Education: PHD   Occupational History  . Retired    Social History Main Topics  . Smoking status: Never Smoker   . Smokeless  tobacco: Never Used  . Alcohol Use: No  . Drug Use: No  . Sexual Activity: No   Other Topics Concern  . None   Social History Narrative   Patient currently in [facility] rehab.   Caffeine Use: 1-2 cups occasionally   Family History  Problem Relation Age of Onset  . Sudden death Mother   . Sudden death Father    Scheduled Meds: . calcitRIOL      . calcitRIOL      . calcitRIOL  0.75 mcg Oral Q M,W,F-HD  . calcium acetate  2,001 mg Oral TID WC  . chlorhexidine      . cinacalcet  30 mg Oral Q supper  . docusate sodium  100 mg Oral BID  . feeding supplement (NEPRO CARB STEADY)  237 mL Oral BID BM  . [START ON 10/29/2015] ferric gluconate (FERRLECIT/NULECIT) IV  62.5 mg Intravenous Q Wed-HD  . folic acid  1 mg Oral Daily  . gabapentin  100 mg Oral Daily  . insulin aspart  0-9 Units Subcutaneous 6 times per day  . lacosamide  100 mg Oral BID  . lacosamide  50 mg Oral Q M,W,F-HD  . lacosamide  50 mg Oral Once  . lidocaine      . pantoprazole  40 mg Oral BID  . piperacillin-tazobactam (ZOSYN)  IV  2.25 g Intravenous 3 times per day  . traMADol       Continuous Infusions:  PRN Meds:.acetaminophen **OR** acetaminophen, camphor-menthol, ondansetron **OR** ondansetron (ZOFRAN) IV, traMADol Medications Prior to Admission:  Prior to Admission medications   Medication Sig Start Date End Date Taking? Authorizing Provider  amLODipine (NORVASC) 10 MG tablet Take 10 mg by mouth Daily.  08/15/12  Yes Historical Provider, MD  calcium acetate (PHOSLO) 667 MG capsule Take 3 capsules by mouth three times a day Patient taking differently: Take 2,001 mg by mouth. Take 3 capsules by mouth three times a day 07/15/15  Yes Sharon Seller, NP  colchicine 0.6 MG tablet Take 1 tablet (0.6 mg total) by mouth as needed. For gout. Patient needs an appointment before anymore refills. 09/29/15  Yes [name] L [name], DO  darbepoetin (ARANESP) 60 MCG/0.3ML SOLN injection Inject 0.3 mLs (60 mcg  total) into the vein every Thursday with hemodialysis. Patient taking differently: Inject 60 mcg into the vein every 7 (seven) days.  09/29/13  Yes Jessica U Vann, DO  Diphenhydramine-APAP 25-500 MG TABS Take 1 tablet by mouth at bedtime as needed (sleep). For sleep   Yes Historical Provider, MD  docusate sodium (COLACE) 100 MG capsule Take 1 capsule (100 mg total) by mouth 2 (two) times daily. Patient taking differently: Take 100 mg by mouth daily as needed for mild constipation.  09/18/15  Yes Vassie Loll, MD  folic acid (FOLVITE) 1 MG tablet Take 1 tablet (1 mg total) by mouth daily. 09/29/13  Yes Jessica U Vann, DO  HUMALOG KWIKPEN 100 UNIT/ML KiwkPen Inject 0-10 Units  into the skin 3 (three) times daily. Sliding Scale 04/16/15  Yes Historical Provider, MD  lacosamide (VIMPAT) 50 MG TABS tablet Take 1 tablet (50 mg total) by mouth every Monday, Wednesday, and Friday with hemodialysis. Patient taking differently: Take 150 mg by mouth every Monday, Wednesday, and Friday with hemodialysis.  07/28/15  Yes Joseph Art, DO  Lacosamide 100 MG TABS Take 1 tablet (100 mg total) by mouth 2 (two) times daily. Patient taking differently: Take 100 mg by mouth 4 (four) times a week. NON dialysis days 07/21/15  Yes [name] L [name], DO  lidocaine (LIDODERM) 5 % Place 1 patch onto the skin daily. Remove & Discard patch within 12 hours or as directed by MD Patient taking differently: Place 1 patch onto the skin daily as needed (pain). Remove & Discard patch within 12 hours or as directed by MD 09/18/15  Yes Vassie Loll, MD  sodium chloride 0.9 % SOLN 100 mL with ferric gluconate 12.5 MG/ML SOLN 62.5 mg Inject 62.5 mg into the vein every Thursday with hemodialysis. 09/29/13  Yes Joseph Art, DO  traMADol (ULTRAM) 50 MG tablet Take one tablet by mouth every 6 hours as needed for pain. Patient needs appointment before anymore refills. 09/29/15  Yes [name] L [name], DO  calcitRIOL (ROCALTROL) 0.5 MCG capsule Take 2  capsules (1 mcg total) by mouth every Monday, Wednesday, and Friday with hemodialysis. Patient not taking: Reported on 09/05/2015 05/23/15   Lonia Blood, MD  cinacalcet (SENSIPAR) 30 MG tablet Take 1 tablet (30 mg total) by mouth daily with supper. Patient not taking: Reported on 10/24/2015 09/18/15   Vassie Loll, MD  gabapentin (NEURONTIN) 100 MG capsule Take 1 capsule (100 mg total) by mouth daily. Patient not taking: Reported on 10/24/2015 05/23/15   Lonia Blood, MD  multivitamin (RENA-VIT) TABS tablet Take 1 tablet by mouth at bedtime. Patient not taking: Reported on 09/10/2015 09/29/13   Joseph Art, DO  Nutritional Supplements (FEEDING SUPPLEMENT, NEPRO CARB STEADY,) LIQD Take 237 mLs by mouth 2 (two) times daily between meals. Patient not taking: Reported on 10/24/2015 09/18/15   Vassie Loll, MD   Allergies  Allergen Reactions  . Aspirin Other (See Comments)    Bleeding   . Lactose Intolerance (Gi) Diarrhea    Review of Systems  Constitutional: Positive for activity change.  HENT: Negative.   Patient is mildly confused and does not offer much detail for ROS  Physical Exam  Polite Wd, Thin elderly black male, NAD, Wife at bedside CV: RRR Resp:  CtA no increased work of breathing Abdomen:  Thin, Nt, + BS Extremities:  No edema   Vital Signs: BP 139/46 mmHg  Pulse 72  Temp(Src) 97 F (36.1 C) (Oral)  Resp 18  Ht 6' (1.829 m)  Wt 89.1 kg (196 lb 6.9 oz)  BMI 26.63 kg/m2  SpO2 100%  SpO2: SpO2: 100 % O2 Device:SpO2: 100 % O2 Flow Rate: .   IO: Intake/output summary:  Intake/Output Summary (Last 24 hours) at 10/25/15 1522 Last data filed at 10/25/15 1500  Gross per 24 hour  Intake    360 ml  Output     12 ml  Net    348 ml    LBM: Last BM Date: 10/23/15 Baseline Weight: Weight: 90.1 kg (198 lb 10.2 oz) Most recent weight: Weight: 89.1 kg (196 lb 6.9 oz)      Palliative Assessment/Data:  Flowsheet Rows        Most Recent  Value   Intake Tab     Referral Department  Hospitalist   Unit at Time of Referral  Med/Surg Unit   Palliative Care Primary Diagnosis  Cancer   Date Notified  10/24/15   Palliative Care Type  Return patient Palliative Care   Reason for referral  Clarify Goals of Care   Date of Admission  10/23/15   Date first seen by Palliative Care  10/25/15   # of days Palliative referral response time  1 Day(s)   # of days IP prior to Palliative referral  1   Clinical Assessment    Palliative Performance Scale Score  70%   Psychosocial & Spiritual Assessment    Palliative Care Outcomes    Patient/Family meeting held?  Yes   Who was at the meeting?  wife and patient   Palliative Care Outcomes  Counseled regarding hospice      Additional Data Reviewed:  CBC:    Component Value Date/Time   WBC 5.2 10/24/2015 0717   WBC 3.3* 05/13/2015 0925   HGB 10.0* 10/24/2015 0717   HCT 30.3* 10/24/2015 0717   HCT 37.9 05/13/2015 0925   PLT 54* 10/24/2015 0717   PLT 128* 05/13/2015 0925   MCV 99.3 10/24/2015 0717   MCV 104* 05/13/2015 0925   NEUTROABS 3.3 09/16/2015 0430   NEUTROABS 1.6 05/13/2015 0925   LYMPHSABS 2.1 09/16/2015 0430   LYMPHSABS 1.3 05/13/2015 0925   MONOABS 0.2 09/16/2015 0430   EOSABS 0.2 09/16/2015 0430   EOSABS 0.1 05/13/2015 0925   EOSABS 0.1 08/15/2014 1059   BASOSABS 0.0 09/16/2015 0430   BASOSABS 0.0 05/13/2015 0925   Comprehensive Metabolic Panel:    Component Value Date/Time   NA 142 10/24/2015 0717   NA 141 05/13/2015 0925   K 4.2 10/24/2015 0717   CL 103 10/24/2015 0717   CO2 22 10/24/2015 0717   BUN 44* 10/24/2015 0717   BUN 56* 05/13/2015 0925   CREATININE 7.47* 10/24/2015 0717   GLUCOSE 198* 10/24/2015 0717   GLUCOSE 127* 05/13/2015 0925   CALCIUM 8.5* 10/24/2015 0717   AST 23 10/23/2015 1108   ALT 13* 10/23/2015 1108   ALKPHOS 46 10/23/2015 1108   BILITOT 0.8 10/23/2015 1108   PROT 7.4 10/23/2015 1108   PROT 7.1 08/15/2014 1059   ALBUMIN 3.1* 10/24/2015 0717   ALBUMIN 4.5  08/15/2014 1059     Time In: 2:00 Time Out: 2:50 Time Total: 50 min. Greater than 50%  of this time was spent counseling and coordinating care related to the above assessment and plan.  Signed by: Algis Downs, PA-C Palliative Medicine Pager: [phone]  10/25/2015, 3:22 PM  Please contact Palliative Medicine Team phone at [phone] for questions and concerns.

## 2015-10-25 NOTE — Procedures (Signed)
RIJV HD catheter removal No comp/EBL 

## 2015-10-25 NOTE — Progress Notes (Signed)
[name] HWE:[id number] DOB: 1932-07-19 DOA: 10/23/2015 PCP: [name], DO  Brief narrative: 82 year old male Known end-stage renal disease Monday Wednesday Friday-still passes urine 3-4 times a day--has a history of intermittent noncompliance Hypertension Bradycardia, junctional-first-degree AV block without any conduction system anomaly noted by EP and not a candidate for pacemaker Diastolic heart failure grade 1 based on TTE 01/14/13 EF 55-60% P ASP 71 History subdural hematoma 9/16 right sided subdural  07/26/15 left-sided subdural secondary to recurrent falls-no midline shift Anemia Diabetes mellitus2  Past medical history-As per Problem list Chart reviewed as below-   Consultants:  Nephrology  Palliative care  Procedures:  None  Antibiotics: Zosyn Vancomycin    Subjective   Looks better and can orient better Confused as to year but can answer place, month, president, season Seen on HD unit earlier Tells me that is not able to do dialysis because of back pain He had a long conversation with nephrologist about this    Objective    Interim History:   Telemetry: Sinus rhythm   Objective: Filed Vitals:   10/25/15 0830 10/25/15 0900 10/25/15 0930 10/25/15 1000  BP: 129/26 140/22 141/67 145/65  Pulse: 82 80 81 75  Temp:      TempSrc:      Resp: 15 14 14 14   Height:      Weight:      SpO2:        Intake/Output Summary (Last 24 hours) at 10/25/15 1030 Last data filed at 10/25/15 0700  Gross per 24 hour  Intake    270 ml  Output      0 ml  Net    270 ml    Exam:  General: EOMI NCAT well-nourished Cardiovascular: S1-S2 no murmur rub or gallop slightly bradycardic Respiratory: Clinically clear no added sound Abdomen: Abdomen soft nontender nondistended no rebound or guarding Skin no lower extremity edema however excoriations bilaterally what appeared to be skin flaps? Neuro power is 5/5  Data Reviewed: Basic Metabolic  Panel:  Recent Labs Lab 10/23/15 1108 10/24/15 0717  NA 141 142  K 4.9 4.2  CL 103 103  CO2 19* 22  GLUCOSE 199* 198*  BUN 80* 44*  CREATININE 10.61* 7.47*  CALCIUM 10.1 8.5*  PHOS  --  5.6*   Liver Function Tests:  Recent Labs Lab 10/23/15 1108 10/24/15 0717  AST 23  --   ALT 13*  --   ALKPHOS 46  --   BILITOT 0.8  --   PROT 7.4  --   ALBUMIN 4.1 3.1*   No results for input(s): LIPASE, AMYLASE in the last 168 hours. No results for input(s): AMMONIA in the last 168 hours. CBC:  Recent Labs Lab 10/23/15 1108 10/24/15 0120 10/24/15 0717  WBC 3.9* 3.6* 5.2  HGB 11.2* 10.0* 10.0*  HCT 35.2* 31.1* 30.3*  MCV 99.7 99.4 99.3  PLT 87* 50* 54*   Cardiac Enzymes: No results for input(s): CKTOTAL, CKMB, CKMBINDEX, TROPONINI in the last 168 hours. BNP: Invalid input(s): POCBNP CBG:  Recent Labs Lab 10/24/15 1127 10/24/15 1610 10/24/15 2113 10/25/15 0048 10/25/15 0453  GLUCAP 151* 150* 185* 154* 97    Recent Results (from the past 240 hour(s))  Culture, blood (routine x 2)     Status: None (Preliminary result)   Collection Time: 10/23/15 10:50 AM  Result Value Ref Range Status   Specimen Description BLOOD LEFT LEG  Final   Special Requests BOTTLES DRAWN AEROBIC AND ANAEROBIC 5CC  Final  Culture  Setup Time   Final    GRAM NEGATIVE RODS IN BOTH AEROBIC AND ANAEROBIC BOTTLES CRITICAL RESULT CALLED TO, READ BACK BY AND VERIFIED WITH: J BOZEMAN,RN AT 1055 10/24/15 BY L BENFIELD    Culture PSEUDOMONAS AERUGINOSA  Final   Report Status PENDING  Incomplete  Culture, blood (routine x 2)     Status: None (Preliminary result)   Collection Time: 10/23/15 10:58 AM  Result Value Ref Range Status   Specimen Description BLOOD RIGHT FOREARM  Final   Special Requests BOTTLES DRAWN AEROBIC ONLY 5CC  Final   Culture  Setup Time   Final    GRAM NEGATIVE RODS AEROBIC BOTTLE ONLY CRITICAL RESULT CALLED TO, READ BACK BY AND VERIFIED WITH: J BOZEMAN 10/24/15 @ 0949 M  VESTAL    Culture   Final    PSEUDOMONAS AERUGINOSA SUSCEPTIBILITIES TO FOLLOW    Report Status PENDING  Incomplete  Urine culture     Status: None   Collection Time: 10/23/15  2:00 PM  Result Value Ref Range Status   Specimen Description URINE, CATHETERIZED  Final   Special Requests Normal  Final   Culture NO GROWTH 1 DAY  Final   Report Status 10/24/2015 FINAL  Final     Studies:              All Imaging reviewed and is as per above notation   Scheduled Meds: . calcitRIOL      . calcitRIOL      . calcitRIOL  0.75 mcg Oral Q M,W,F-HD  . calcium acetate  2,001 mg Oral TID WC  . chlorhexidine      . cinacalcet  30 mg Oral Q supper  . docusate sodium  100 mg Oral BID  . feeding supplement (NEPRO CARB STEADY)  237 mL Oral BID BM  . [START ON 10/29/2015] ferric gluconate (FERRLECIT/NULECIT) IV  62.5 mg Intravenous Q Wed-HD  . folic acid  1 mg Oral Daily  . gabapentin  100 mg Oral Daily  . insulin aspart  0-9 Units Subcutaneous 6 times per day  . lacosamide  100 mg Oral BID  . lacosamide  50 mg Oral Q M,W,F-HD  . lacosamide  50 mg Oral Once  . lidocaine      . pantoprazole  40 mg Oral BID  . piperacillin-tazobactam (ZOSYN)  IV  2.25 g Intravenous 3 times per day  . traMADol       Continuous Infusions: . pantoprozole (PROTONIX) infusion 8 mg/hr (10/25/15 0000)     Assessment/Plan:  1. Probable sepsis in the setting of fever tmax 100 and elevated procalcitonin. 2/2 bottles Blood culture 2/16 = Pseudomonas .  Transitioned to Ceftazidime c HD 2. ? GI bleed, history colonoscopy 07/2006, 08/2007-EGD = esophagitis, colonoscopies showed diverticula and capsule 2008 negative for AVM suspicious bleeds--patient had 200 cc of coffee ground emesis and was placed on Protonix gtt while on dialysis.  tolerated breakfast without acute event and no dark stool transitioned PPI drip to Protonix by mouth twice a day 2/18  3. Toxic metabolic encephalopathy-probably secondary to #1 4. Prior  subdural hemorrhages with seizures-may acccount for altered mentation.  continue Vimpat 100 twice a day as well as 50 mg Monday Wednesday Friday 5. Hypertension-Cut back amlodipine 10 mg and do not administer given hypotension earlier this admission 6. Diabetes mellitus with diabetic neuropathy/nephropathy-continue gabapentin 100 daily 7. ESRD MWF-we will need to determine if patient is a viable dialysis candidate ongoing. Seems to sign off early  and has had 5 admissions recently with significant decline based on notes from subdural hemorrhages. appreciate palliative care input.  Patient hospice eligible and family deciding regarding this  8. Metabolic bone disease-per nephrology 9. Anemia of renal disease-getting IV iron with dialysis  Discussed with wife on 2/18 Monitor susceptibilities and transition to PO abx soon D/c 24-48 hours once decisions made re: Simonne Come, MD  [facility] Pager [phone] 10/25/2015, 10:30 AM    LOS: 1 day

## 2015-10-25 NOTE — Progress Notes (Signed)
Pharmacy Antibiotic Note  [name] is a 82 y.o. male admitted on 10/23/2015 with bacteremia.  Pharmacy has been consulted for vancomycin and ceftazidime dosing. Patient is on MWF HD with last session 2/15  Plan: Ceftazidime 2g IV qHD Monitor culture data, HD plan and clinical course    Height: 6' (182.9 cm) Weight: 196 lb 6.9 oz (89.1 kg) IBW/kg (Calculated) : 77.6  Temp (24hrs), Avg:98 F (36.7 C), Min:97 F (36.1 C), Max:99 F (37.2 C)   Recent Labs Lab 10/23/15 1108 10/23/15 1111 10/24/15 0120 10/24/15 0717  WBC 3.9*  --  3.6* 5.2  CREATININE 10.61*  --   --  7.47*  LATICACIDVEN  --  1.42  --   --     Estimated Creatinine Clearance: 8.5 mL/min (by C-G formula based on Cr of 7.47).    Allergies  Allergen Reactions  . Aspirin Other (See Comments)    Bleeding   . Lactose Intolerance (Gi) Diarrhea    Antimicrobials this admission: Vancomycin 2/16>> Zosyn 2/16>>2/18 Ceftaz 2/18>>  Dose adjustments this admission: N/A  Microbiology results: 2/16 Urine>> ngtd 2/16 Blood x 2>> pseudomonas   Arlean Hopping. Newman Pies, PharmD, BCPS Clinical Pharmacist Pager [phone] 10/25/2015 3:58 PM

## 2015-10-25 NOTE — Procedures (Signed)
I have personally attended this patient's dialysis session. Pt is calm, has some word finding issues. Acknowledges that his memory is "going bad" 2K bath Labs were overlooked in the orders by HD RN so no labs Using AVG today - 2 17 ga needles - no issues with cannulation Will get TDC out (IR placed 07/29/15) d/t GNR bacteremia  Camille Bal, MD [facility] [phone] Pager 10/25/2015, 10:59 AM

## 2015-10-25 NOTE — Progress Notes (Addendum)
[facility] Rounding Note  Subjective:   Seen in HD  Using his L AVG 17 ga needles no issue Remains oriented to person and place Seems to understand that the [facility] needs to come out Says he doesn't like HD because "his back hurts sitting in the chair for so long" (problematic to medicate pt in HD d/t BP drops with pain meds)  Objective Vital signs in last 24 hours: Filed Vitals:   10/25/15 0900 10/25/15 0930 10/25/15 1000 10/25/15 1030  BP: 140/22 141/67 145/65 124/65  Pulse: 80 81 75 75  Temp:      TempSrc:      Resp: 14 14 14 17   Height:      Weight:      SpO2:       Weight change:   Intake/Output Summary (Last 24 hours) at 10/25/15 1100 Last data filed at 10/25/15 0700  Gross per 24 hour  Intake    270 ml  Output      0 ml  Net    270 ml   Physical Exam:  BP 124/65 mmHg  Pulse 75  Temp(Src) 97 F (36.1 C) (Oral)  Resp 17  Ht 6' (1.829 m)  Wt 89.1 kg (196 lb 6.9 oz)  BMI 26.63 kg/m2  SpO2 99%   General: Elderly AAMseen in the HD unit Head: HOH Neck: Supple. JVD not elevated. Lungs: Clear  Heart: RRR with S1 S2. No murmurs, rubs, or gallops appreciated. Abdomen: Soft, non-tender, non-distended with normoactive bowel sounds.  Lower extremities:without edema or ischemic changes, no open wounds  Neuro: Awake - Oriented to place- remembers his phone #. Seems a little more conversational.  Dialysis Access: right IJ TDC site not tender and left upper AVGG is currently cannulated  Labs: Basic Metabolic Panel:  Recent Labs Lab 10/23/15 1108 10/24/15 0717  NA 141 142  K 4.9 4.2  CL 103 103  CO2 19* 22  GLUCOSE 199* 198*  BUN 80* 44*  CREATININE 10.61* 7.47*  CALCIUM 10.1 8.5*  PHOS  --  5.6*   Liver Function Tests:  Recent Labs Lab 10/23/15 1108 10/24/15 0717  AST 23  --   ALT 13*  --   ALKPHOS 46  --   BILITOT 0.8  --   PROT 7.4  --   ALBUMIN 4.1 3.1*     Recent Labs Lab 10/23/15 1108 10/24/15 0120 10/24/15 0717  WBC 3.9* 3.6* 5.2  HGB  11.2* 10.0* 10.0*  HCT 35.2* 31.1* 30.3*  MCV 99.7 99.4 99.3  PLT 87* 50* 54*    Recent Labs Lab 10/24/15 1127 10/24/15 1610 10/24/15 2113 10/25/15 0048 10/25/15 0453  GLUCAP 151* 150* 185* 154* 97  Results for BEREKET, GERNERT (MRN [name]) as of 10/25/2015 10:54  Ref. Range 10/25/2015 05:46  Procalcitonin Latest Units: ng/mL 135.29    Medications: . pantoprozole (PROTONIX) infusion 8 mg/hr (10/25/15 0000)   . calcitRIOL      . calcitRIOL      . calcitRIOL  0.75 mcg Oral Q M,W,F-HD  . calcium acetate  2,001 mg Oral TID WC  . cinacalcet  30 mg Oral Q supper  . docusate sodium  100 mg Oral BID  . feeding supplement (NEPRO CARB STEADY)  237 mL Oral BID BM  . [START ON 10/29/2015] ferric gluconate (FERRLECIT/NULECIT) IV  62.5 mg Intravenous Q Wed-HD  . folic acid  1 mg Oral Daily  . gabapentin  100 mg Oral Daily  . insulin aspart  0-9 Units Subcutaneous  6 times per day  . lacosamide  100 mg Oral BID  . lacosamide  50 mg Oral Q M,W,F-HD  . [START ON 10/27/2015] pantoprazole (PROTONIX) IV  40 mg Intravenous Q12H  . piperacillin-tazobactam (ZOSYN)  IV  2.25 g Intravenous 3 times per day  . traMADol       Dialysis Orders: Center: [place] MWF - 4 hr EDW 89.5 2K 2.25 Ca heparin none venofer 50 per week mircera 200 q 2 weeks - last 2/8 calcitriiol 0.75 right IJ and left upper AVGG maturing - pt won't use until he gets numbing med- it is ready to use Labs; Most recent hgb 9.5 40% sat iPTH 548 - last HD 2/13 - left below edw with net UF 2.5 -still on no heparin HD due to SDH hx   Background 82 y.o. male with ESRD on MWF HD with history of multiple medical issues including prior SDH 05/2015. He had a recent admission last month with AMS with no overt explanation. According to dialysis records and confirmed by phone call to the dialysis unit, his last dialysis was Monday, went to dialysis Wednesday, but sat in the lobby, refused to go it and has his wife called to pick him up. Told the  dialysis staff that he just didn't feel right; he was very nonspecific. Today his wife tells me he did dialysis Wednesday and perhaps she thought he did. When I asked his wife why he was here today she said he fell at home and then said, maybe it was yesterday, no maybe today. I asked her if he was able to get off the floor by himself and she said she helped and also called EMS. She tells me she drives him to and from dialysis and he has been getting home PT for deconditioning.   According to the admission H and P, EMS was called due to pt's inability to ambulate and increasing confusion. Pt was unable to articulate why here. Evaluation in the ED showed neg CXR, Head CT neg for acute changes. WBC 3.9 BUN 80 and Cr 10.6 K CO2 19 K 4.9 He received 1 L IVF in the ED temp was 100.1 BP was not low.EKG negative. Blood cultures + for pseudomonas.   Assessment/Plan: 1. Acute encephalopathy - work up per primary. Underlying dementia tipped over by probable sepsis 2. Pseudomonas bacteremia - 2/2 blood cultures. UC negative. Using AVG today so will get [facility] out as possible source. Certainly this could have contributed to MS changes acutely. On vanco and zosy right now - ? Change to fortaz?  Primary care is managing.  3. Probable underlying dementia 4. Failure to thrive 5. ESRD - MWF - HD is off schedule due to missed tmt PTA and need for HD on Thurs. HD today then back to MWF schedule on Monday. Using AVG now - advance needle gauge as tolerated 6. Hypertension/volume - variable - getting +/- edw - when feeling better, need to be certain he is NOT orthostatic as he ambulates on his own around the house; no overt volume BP in usual range 7. Coffee ground emesis - Remains on protonix drip. Hb 11.2->10 and labs not done as ordered this AM. Will check CBC later today and in the AM.  Does not get heparin with HD (prior SDH) 8. Anemia - Due for redose ESA 2/24 - follow trends  9. Metabolic bone disease - Ca 10.1  alb 4.1 - continue same /sensipar/phoslo - hold calcitriol until back on MWF schedule -  Ca 10.1 - resume on MWF schedule 10. Nutrition - renal diet/vit/supplements alb 4.1 7. Seizure disorder - on Vimpat. Pharmacy wants to give supplemental post HD dose -- OK with me. 8. DNR - pt has some degree of dementia as does I think his wife- at the very least she has short term memory issues; I think this is very difficult for them as they are quite highly educated. Primary team very appropriately is discussion option of Palliative Care evaluation (his Medical Director Dr. Marisue Humble feels he has been making very poor decisions with regards to his dialysis (signing off, etc), doing less well overall and he would agree with this evaluation as well).  Camille Bal, MD [facility] [phone] pager 10/25/2015, 11:00 AM

## 2015-10-26 LAB — CBC
HCT: 29.5 % — ABNORMAL LOW (ref 39.0–52.0)
HEMOGLOBIN: 9.7 g/dL — AB (ref 13.0–17.0)
MCH: 33 pg (ref 26.0–34.0)
MCHC: 32.9 g/dL (ref 30.0–36.0)
MCV: 100.3 fL — ABNORMAL HIGH (ref 78.0–100.0)
Platelets: 46 10*3/uL — ABNORMAL LOW (ref 150–400)
RBC: 2.94 MIL/uL — ABNORMAL LOW (ref 4.22–5.81)
RDW: 16.8 % — ABNORMAL HIGH (ref 11.5–15.5)
WBC: 3.1 10*3/uL — AB (ref 4.0–10.5)

## 2015-10-26 LAB — RENAL FUNCTION PANEL
ALBUMIN: 2.8 g/dL — AB (ref 3.5–5.0)
ANION GAP: 11 (ref 5–15)
BUN: 37 mg/dL — AB (ref 6–20)
CALCIUM: 8.5 mg/dL — AB (ref 8.9–10.3)
CO2: 29 mmol/L (ref 22–32)
Chloride: 97 mmol/L — ABNORMAL LOW (ref 101–111)
Creatinine, Ser: 6.67 mg/dL — ABNORMAL HIGH (ref 0.61–1.24)
GFR calc Af Amer: 8 mL/min — ABNORMAL LOW (ref >60)
GFR, EST NON AFRICAN AMERICAN: 7 mL/min — AB (ref >60)
GLUCOSE: 175 mg/dL — AB (ref 65–99)
PHOSPHORUS: 4 mg/dL (ref 2.5–4.6)
Potassium: 3.9 mmol/L (ref 3.5–5.1)
SODIUM: 137 mmol/L (ref 135–145)

## 2015-10-26 LAB — CULTURE, BLOOD (ROUTINE X 2)

## 2015-10-26 LAB — GLUCOSE, CAPILLARY
GLUCOSE-CAPILLARY: 188 mg/dL — AB (ref 65–99)
GLUCOSE-CAPILLARY: 264 mg/dL — AB (ref 65–99)
GLUCOSE-CAPILLARY: 181 mg/dL — AB (ref 65–99)
Glucose-Capillary: 134 mg/dL — ABNORMAL HIGH (ref 65–99)
Glucose-Capillary: 182 mg/dL — ABNORMAL HIGH (ref 65–99)

## 2015-10-26 NOTE — Significant Event (Signed)
Dr. Ninetta Lights and discussed with me the case over the phone and recommends 2 weeks of [place] with HD and then one week later repeat blood cultures to see if there is any persistent bacteremia. If that is the case and further workup probably with an echocardiogram may need to be performed.  Pleas Koch, MD [facility] [phone]

## 2015-10-26 NOTE — Progress Notes (Signed)
[facility] Rounding Note  Subjective:   Used his L AVG in HD yesterday 17 ga needles no issue Remains oriented to person and place and is more conversant today TDC removed yesterday by IR Palliative care has seen and recommends some Hospice services but says pt still willing to have HD  Walked some with the walker - "weak" but felt pretty steady on his feet  Objective Vital signs in last 24 hours: Filed Vitals:   10/25/15 1221 10/25/15 2213 10/26/15 0500 10/26/15 0511  BP: 139/46 134/62  171/77  Pulse: 72 73  75  Temp:  98.4 F (36.9 C)  99.3 F (37.4 C)  TempSrc:  Oral  Oral  Resp: 18 18  18   Height:      Weight:   91.1 kg (200 lb 13.4 oz)   SpO2:  96%  96%   Weight change:   Intake/Output Summary (Last 24 hours) at 10/26/15 1050 Last data filed at 10/26/15 0912  Gross per 24 hour  Intake   1060 ml  Output     12 ml  Net   1048 ml   Physical Exam:  BP 171/77 mmHg  Pulse 75  Temp(Src) 99.3 F (37.4 C) (Oral)  Resp 18  Ht 6' (1.829 m)  Wt 91.1 kg (200 lb 13.4 oz)  BMI 27.23 kg/m2  SpO2 96%   General: Elderly AAM sitting up in the chair. More conversant today, thanked me for talking with him Head: HOH Neck: No JVD Lungs: Clear  Dressing at site of [facility] removal r chest Heart: Regular with S1 S2. No murmurs, rubs, or gallops appreciated. Abdomen: Soft, non-tender, non-distended   Lower extremities:without edema or ischemic changes  Dialysis Access: Left upper AVGG + bruit  Labs: Basic Metabolic Panel:  Recent Labs Lab 10/23/15 1108 10/24/15 0717 10/26/15 0555  NA 141 142 137  K 4.9 4.2 3.9  CL 103 103 97*  CO2 19* 22 29  GLUCOSE 199* 198* 175*  BUN 80* 44* 37*  CREATININE 10.61* 7.47* 6.67*  CALCIUM 10.1 8.5* 8.5*  PHOS  --  5.6* 4.0   Liver Function Tests:  Recent Labs Lab 10/23/15 1108 10/24/15 0717 10/26/15 0555  AST 23  --   --   ALT 13*  --   --   ALKPHOS 46  --   --   BILITOT 0.8  --   --   PROT 7.4  --   --   ALBUMIN 4.1 3.1* 2.8*      Recent Labs Lab 10/24/15 0120 10/24/15 0717 10/25/15 1704 10/26/15 0555  WBC 3.6* 5.2 3.1* 3.1*  HGB 10.0* 10.0* 9.9* 9.7*  HCT 31.1* 30.3* 31.0* 29.5*  MCV 99.4 99.3 100.0 100.3*  PLT 50* 54* 41* 46*    Recent Labs Lab 10/25/15 1554 10/25/15 1951 10/25/15 2351 10/26/15 0513 10/26/15 0734  GLUCAP 252* 239* 220* 188* 134*   Results for [name], [name] (MRN [name]) as of 10/25/2015 10:54  Ref. Range 10/25/2015 05:46  Procalcitonin Latest Units: ng/mL 135.29    Medications:   . calcitRIOL  0.75 mcg Oral Q M,W,F-HD  . calcium acetate  2,001 mg Oral TID WC  . [START ON 10/27/2015] cefTAZidime (FORTAZ)  IV  2 g Intravenous Q M,W,F-HD  . cinacalcet  30 mg Oral Q supper  . docusate sodium  100 mg Oral BID  . feeding supplement (NEPRO CARB STEADY)  237 mL Oral BID BM  . [START ON 10/29/2015] ferric gluconate (FERRLECIT/NULECIT) IV  62.5 mg Intravenous  Q Wed-HD  . folic acid  1 mg Oral Daily  . gabapentin  100 mg Oral Daily  . insulin aspart  0-9 Units Subcutaneous 6 times per day  . lacosamide  100 mg Oral BID  . lacosamide  50 mg Oral Q M,W,F-HD  . pantoprazole  40 mg Oral BID   Dialysis Orders: Center: [place] MWF 4 hr  EDW 89.5   2K 2.25 Ca  heparin none (prior SDH)  venofer 50 per week  mircera 200 q 2 weeks - last 2/8  calcitriol 0.75 TIW right IJ removed 2/18 Left upper AVGG using now with 17 ga needles  Background 82 y.o. male with ESRD on MWF HD with history of multiple medical issues including prior SDH 05/2015. He had a recent admission last month with AMS with no overt explanation. According to dialysis records and confirmed by phone call to the dialysis unit, his last dialysis was Monday, went to dialysis Wednesday, but sat in the lobby, refused to go it and has his wife called to pick him up. Told the dialysis staff that he just didn't feel right; he was very nonspecific. Today his wife tells me he did dialysis Wednesday and perhaps she thought he  did. When I asked his wife why he was here today she said he fell at home and then said, maybe it was yesterday, no maybe today. I asked her if he was able to get off the floor by himself and she said she helped and also called EMS. She tells me she drives him to and from dialysis and he has been getting home PT for deconditioning.   According to the admission H and P, EMS was called due to pt's inability to ambulate and increasing confusion. Pt was unable to articulate why here. Evaluation in the ED showed neg CXR, Head CT neg for acute changes. WBC 3.9 BUN 80 and Cr 10.6 K CO2 19 K 4.9 He received 1 L IVF in the ED temp was 100.1 BP was not low.EKG negative. Blood cultures + for pseudomonas.   Assessment/Plan: 1. Acute encephalopathy - work up per primary. Underlying dementia tipped over by GNR sepsis. Seems much improved to me re level of alertness and awareness of some of his issues.  2. Pseudomonas bacteremia - 2/2 blood cultures. UC negative. TDC out 2/18.  Certainly this could have contributed to MS changes acutely. On vanco (? Stop) and zosyn changed to [place]. Need duration of therapy (anticipate minimum of 2 weeks from date of [facility] removal 2/18 3. Probable underlying dementia 4. ESRD - MWF - HD yesterday off schedule. Next HD tomorrow. Using AVG w/17 ga needles - advance needle gauge as tolerated TDC out 2/18. 5. Hypertension/volume - variable - getting +/- edw - when feeling better, need to be certain he is NOT orthostatic as he ambulates on his own. Last post HD weight 89.1 so prob new EDW 89. 6. Coffee ground emesis - Protonix changed to po.  Hb 11.2->10->9.7 today. Does not get heparin with HD (prior SDH) 7. Anemia - Due for redose ESA Mircera 200 on 2/24 - Recheck tsat w/HD tomorrow.  8. Metabolic bone disease -  continue same /sensipar/phoslo 9. Nutrition - renal diet/vit/supplements alb 4.1 7.      Seizure disorder - on Vimpat. Pharmacy wants to give supplemental post HD dose  -- OK with me. 8.      DNR - pt has some degree of dementia as does I think his wife- at  the very least she has short term memory issues; I think this is very difficult for them as they are quite highly educated. Primary team very appropriately is discussion option of Palliative Care evaluation (his Medical Director Dr. Marisue Humble feels he has been making very poor decisions with regards to his dialysis - signing off, etc), doing less well overall and he would agree with this evaluation as well). Pall Care has seen - see their note.  Camille Bal, MD [facility] [phone] pager 10/26/2015, 10:50 AM

## 2015-10-26 NOTE — Progress Notes (Signed)
[name] VVO:[id number] DOB: [birth year] DOA: 10/23/2015 PCP: [name], DO  Brief narrative: 82 year old male Known end-stage renal disease Monday Wednesday Friday-still passes urine 3-4 times a day--has a history of intermittent noncompliance Hypertension Bradycardia, junctional-first-degree AV block without any conduction system anomaly noted by EP and not a candidate for pacemaker Diastolic heart failure grade 1 based on TTE 01/14/13 EF 55-60% P ASP 71 History subdural hematoma 9/16 right sided subdural  07/26/15 left-sided subdural secondary to recurrent falls-no midline shift Anemia Diabetes mellitus2  Admitted with toxic metabolic encephalopathy and eventually found to have 2/2 blood cultures growing Pseudomonas Patient tells me that his temperature dialysis catheter that was placed in right chest started aching and pain in about one month after it was placed and this has been in situ 4. If time Also noted patient has been noncompliant with dialysis at times because of a variety of reasons-signing off early because of discomfort in the back and feels like he is not being explained how much fluid there taken off in a setting of goal of trying to lower EDW  Past medical history-As per Problem list Chart reviewed as below-   Consultants:  Nephrology  Palliative care  Procedures:  None  Antibiotics: Zosyn Vancomycin    Subjective   Much more oriented Wife and son present in room Long discussion with patient about dialysis and his concerns at dialysis center No chest pain no nausea no vomiting no shortness breath No blurred vision no double vision Tolerating diet well and seems close to baseline   Objective    Interim History:   Telemetry: Sinus rhythm   Objective: Filed Vitals:   10/25/15 2213 10/26/15 0500 10/26/15 0511 10/26/15 1343  BP: 134/62  171/77 142/62  Pulse: 73  75 72  Temp: 98.4 F (36.9 C)  99.3 F (37.4 C) 98.8 F (37.1 C)    TempSrc: Oral  Oral Oral  Resp: 18  18 18   Height:      Weight:  91.1 kg (200 lb 13.4 oz)    SpO2: 96%  96% 100%    Intake/Output Summary (Last 24 hours) at 10/26/15 1549 Last data filed at 10/26/15 1508  Gross per 24 hour  Intake   1060 ml  Output      0 ml  Net   1060 ml    Exam:  General: EOMI NCAT well-nourished Cardiovascular: S1-S2 no murmur rub or gallop slightly bradycardic Respiratory: Clinically clear no added sound Abdomen: Abdomen soft nontender nondistended no rebound or guarding Skin no lower extremity edema however excoriations bilaterally what appeared to be skin flaps? Neuro power is 5/5  Data Reviewed: Basic Metabolic Panel:  Recent Labs Lab 10/23/15 1108 10/24/15 0717 10/26/15 0555  NA 141 142 137  K 4.9 4.2 3.9  CL 103 103 97*  CO2 19* 22 29  GLUCOSE 199* 198* 175*  BUN 80* 44* 37*  CREATININE 10.61* 7.47* 6.67*  CALCIUM 10.1 8.5* 8.5*  PHOS  --  5.6* 4.0   Liver Function Tests:  Recent Labs Lab 10/23/15 1108 10/24/15 0717 10/26/15 0555  AST 23  --   --   ALT 13*  --   --   ALKPHOS 46  --   --   BILITOT 0.8  --   --   PROT 7.4  --   --   ALBUMIN 4.1 3.1* 2.8*   No results for input(s): LIPASE, AMYLASE in the last 168 hours. No results for input(s): AMMONIA in the last  168 hours. CBC:  Recent Labs Lab 10/23/15 1108 10/24/15 0120 10/24/15 0717 10/25/15 1704 10/26/15 0555  WBC 3.9* 3.6* 5.2 3.1* 3.1*  HGB 11.2* 10.0* 10.0* 9.9* 9.7*  HCT 35.2* 31.1* 30.3* 31.0* 29.5*  MCV 99.7 99.4 99.3 100.0 100.3*  PLT 87* 50* 54* 41* 46*   Cardiac Enzymes: No results for input(s): CKTOTAL, CKMB, CKMBINDEX, TROPONINI in the last 168 hours. BNP: Invalid input(s): POCBNP CBG:  Recent Labs Lab 10/25/15 2351 10/26/15 0513 10/26/15 0734 10/26/15 1116 10/26/15 1536  GLUCAP 220* 188* 134* 182* 264*    Recent Results (from the past 240 hour(s))  Culture, blood (routine x 2)     Status: None (Preliminary result)   Collection Time:  10/23/15 10:50 AM  Result Value Ref Range Status   Specimen Description BLOOD LEFT LEG  Final   Special Requests BOTTLES DRAWN AEROBIC AND ANAEROBIC 5CC  Final   Culture  Setup Time   Final    GRAM NEGATIVE RODS IN BOTH AEROBIC AND ANAEROBIC BOTTLES CRITICAL RESULT CALLED TO, READ BACK BY AND VERIFIED WITH: J BOZEMAN,RN AT 1055 10/24/15 BY L BENFIELD    Culture   Final    PSEUDOMONAS AERUGINOSA SUSCEPTIBILITIES PERFORMED ON PREVIOUS CULTURE WITHIN THE LAST 5 DAYS.    Report Status PENDING  Incomplete  Culture, blood (routine x 2)     Status: None   Collection Time: 10/23/15 10:58 AM  Result Value Ref Range Status   Specimen Description BLOOD RIGHT FOREARM  Final   Special Requests BOTTLES DRAWN AEROBIC ONLY 5CC  Final   Culture  Setup Time   Final    GRAM NEGATIVE RODS AEROBIC BOTTLE ONLY CRITICAL RESULT CALLED TO, READ BACK BY AND VERIFIED WITH: J BOZEMAN 10/24/15 @ 0949 M VESTAL    Culture PSEUDOMONAS AERUGINOSA  Final   Report Status 10/26/2015 FINAL  Final   Organism ID, Bacteria PSEUDOMONAS AERUGINOSA  Final      Susceptibility   Pseudomonas aeruginosa - MIC*    CEFTAZIDIME 4 SENSITIVE Sensitive     CIPROFLOXACIN <=0.25 SENSITIVE Sensitive     GENTAMICIN 2 SENSITIVE Sensitive     IMIPENEM 2 SENSITIVE Sensitive     PIP/TAZO 8 SENSITIVE Sensitive     CEFEPIME 4 SENSITIVE Sensitive     * PSEUDOMONAS AERUGINOSA  Urine culture     Status: None   Collection Time: 10/23/15  2:00 PM  Result Value Ref Range Status   Specimen Description URINE, CATHETERIZED  Final   Special Requests Normal  Final   Culture NO GROWTH 1 DAY  Final   Report Status 10/24/2015 FINAL  Final     Studies:              All Imaging reviewed and is as per above notation   Scheduled Meds: . calcitRIOL  0.75 mcg Oral Q M,W,F-HD  . calcium acetate  2,001 mg Oral TID WC  . [START ON 10/27/2015] cefTAZidime (FORTAZ)  IV  2 g Intravenous Q M,W,F-HD  . cinacalcet  30 mg Oral Q supper  . docusate sodium   100 mg Oral BID  . feeding supplement (NEPRO CARB STEADY)  237 mL Oral BID BM  . [START ON 10/29/2015] ferric gluconate (FERRLECIT/NULECIT) IV  62.5 mg Intravenous Q Wed-HD  . folic acid  1 mg Oral Daily  . gabapentin  100 mg Oral Daily  . insulin aspart  0-9 Units Subcutaneous 6 times per day  . lacosamide  100 mg Oral BID  . lacosamide  50 mg Oral Q M,W,F-HD  . pantoprazole  40 mg Oral BID   Continuous Infusions:     Assessment/Plan:  1. Bacteremia secondary to temporary dialysis catheter tmax 100 and elevated procalcitonin. 2/2 bottles Blood culture 2/16 = Pseudomonas .  Transitioned to Ceftazidime c HD--Will curbside infectious disease physician regarding duration. 2. ? GI bleed, history colonoscopy 07/2006, 08/2007-EGD = esophagitis, colonoscopies showed diverticula and capsule 2008 negative for AVM suspicious bleeds--patient had 200 cc of coffee ground emesis and was placed on Protonix gtt while on dialysis.  tolerated breakfast without acute event and no dark stool transitioned PPI drip to Protonix by mouth twice a day 2/18.  Will need follow-up with Dr. Elnoria Howard as an outpatient-cannot use nonsteroidals 3. Toxic metabolic encephalopathy-probably secondary to #1 4. Prior subdural hemorrhages with seizures-may acccount for altered mentation.  continue Vimpat 100 twice a day as well as 50 mg Monday Wednesday Friday 5. Hypertension-Cut back amlodipine 10 mg and do not administer given hypotension earlier this admission--will need ambulation and orthostatics prior to discharge as nephrology is attempting to lower EDW 6. Diabetes mellitus with diabetic neuropathy/nephropathy-continue gabapentin 100 daily 7. ESRD MWF-we will need to determine if patient is a viable dialysis candidate ongoing. Seems to sign off early and has had 5 admissions recently . We explored the remainder of the above to completely control his pain at dialysis and that he may need to premedicate with Tylenol prior to dialysis.  I discussed with Dr. Daisy Lazar who concurs that tramadol is not a good option because this can make him lethargic and no medications for back pain can be given at dialysis time at an outpatient as that is not the protocol. I did mention to patient and son and wife that it will be a good idea to make an appointment to meet Dr. Marisue Humble and discuss some of these issues ongoing. Family understands 8. Metabolic bone disease-per nephrology 9. Anemia of renal disease-getting IV iron with dialysis  Discussed with wife on 2/18 Monitor susceptibilities and transition to PO abx soon  Patient tells me he wishes to continue dialysis--we had a long conversation regarding his goals and he definitely wants to "live as long as he can". We appreciate palliative care input and we will continue to treat him as aggressively as we can until he and family decided regarding outpatient management. Ulikely he can be on dialysis and be on hospice at the same time  Pleas Koch, MD  [facility] Pager [phone] 10/26/2015, 3:49 PM    LOS: 2 days

## 2015-10-27 DIAGNOSIS — Z7189 Other specified counseling: Secondary | ICD-10-CM

## 2015-10-27 DIAGNOSIS — M545 Low back pain: Secondary | ICD-10-CM

## 2015-10-27 DIAGNOSIS — G8929 Other chronic pain: Secondary | ICD-10-CM

## 2015-10-27 DIAGNOSIS — Z515 Encounter for palliative care: Secondary | ICD-10-CM

## 2015-10-27 LAB — CBC WITH DIFFERENTIAL/PLATELET
BASOS ABS: 0 10*3/uL (ref 0.0–0.1)
BASOS PCT: 0 %
Eosinophils Absolute: 0.1 10*3/uL (ref 0.0–0.7)
Eosinophils Relative: 4 %
HEMATOCRIT: 27.4 % — AB (ref 39.0–52.0)
HEMOGLOBIN: 8.9 g/dL — AB (ref 13.0–17.0)
Lymphocytes Relative: 34 %
Lymphs Abs: 0.8 10*3/uL (ref 0.7–4.0)
MCH: 31.4 pg (ref 26.0–34.0)
MCHC: 32.5 g/dL (ref 30.0–36.0)
MCV: 96.8 fL (ref 78.0–100.0)
Monocytes Absolute: 0.2 10*3/uL (ref 0.1–1.0)
Monocytes Relative: 9 %
NEUTROS ABS: 1.3 10*3/uL — AB (ref 1.7–7.7)
NEUTROS PCT: 53 %
Platelets: 49 10*3/uL — ABNORMAL LOW (ref 150–400)
RBC: 2.83 MIL/uL — AB (ref 4.22–5.81)
RDW: 16.3 % — ABNORMAL HIGH (ref 11.5–15.5)
WBC: 2.5 10*3/uL — ABNORMAL LOW (ref 4.0–10.5)

## 2015-10-27 LAB — CULTURE, BLOOD (ROUTINE X 2)

## 2015-10-27 LAB — RENAL FUNCTION PANEL
ALBUMIN: 3 g/dL — AB (ref 3.5–5.0)
ANION GAP: 16 — AB (ref 5–15)
BUN: 62 mg/dL — AB (ref 6–20)
CO2: 26 mmol/L (ref 22–32)
Calcium: 8.8 mg/dL — ABNORMAL LOW (ref 8.9–10.3)
Chloride: 96 mmol/L — ABNORMAL LOW (ref 101–111)
Creatinine, Ser: 9.19 mg/dL — ABNORMAL HIGH (ref 0.61–1.24)
GFR calc Af Amer: 5 mL/min — ABNORMAL LOW (ref >60)
GFR, EST NON AFRICAN AMERICAN: 5 mL/min — AB (ref >60)
Glucose, Bld: 197 mg/dL — ABNORMAL HIGH (ref 65–99)
PHOSPHORUS: 5.3 mg/dL — AB (ref 2.5–4.6)
POTASSIUM: 3.9 mmol/L (ref 3.5–5.1)
Sodium: 138 mmol/L (ref 135–145)

## 2015-10-27 LAB — GLUCOSE, CAPILLARY
GLUCOSE-CAPILLARY: 110 mg/dL — AB (ref 65–99)
GLUCOSE-CAPILLARY: 160 mg/dL — AB (ref 65–99)
Glucose-Capillary: 101 mg/dL — ABNORMAL HIGH (ref 65–99)
Glucose-Capillary: 103 mg/dL — ABNORMAL HIGH (ref 65–99)
Glucose-Capillary: 157 mg/dL — ABNORMAL HIGH (ref 65–99)

## 2015-10-27 LAB — IRON AND TIBC
IRON: 58 ug/dL (ref 45–182)
SATURATION RATIOS: 28 % (ref 17.9–39.5)
TIBC: 209 ug/dL — ABNORMAL LOW (ref 250–450)
UIBC: 151 ug/dL

## 2015-10-27 LAB — PROCALCITONIN: PROCALCITONIN: 113.95 ng/mL

## 2015-10-27 MED ORDER — DEXTROSE 5 % IV SOLN: 2.0000 g | INTRAVENOUS | Status: DC

## 2015-10-27 MED ORDER — PANTOPRAZOLE SODIUM 40 MG PO TBEC: 40.0000 mg | DELAYED_RELEASE_TABLET | Freq: Two times a day (BID) | ORAL | Status: DC

## 2015-10-27 NOTE — Progress Notes (Signed)
Discussed discharge summary with patient. Reviewed all medications with patient and his son. Patient ready for discharge.

## 2015-10-27 NOTE — Progress Notes (Signed)
[facility] Rounding Note  Subjective:  Used his L AVG in HD Sat 17 ga needles no issue Alert and not confused grossly  Walked some with the walker - "weak" but felt pretty steady on his feet  Objective Vital signs in last 24 hours: Filed Vitals:   10/27/15 1330 10/27/15 1400 10/27/15 1430 10/27/15 1500  BP: 132/63 141/63 126/65 119/64  Pulse: 70 75 71 77  Temp:      TempSrc:      Resp: 16 18 16 17   Height:      Weight:      SpO2:       Weight change: 3.4 kg (7 lb 7.9 oz)  Intake/Output Summary (Last 24 hours) at 10/27/15 1514 Last data filed at 10/27/15 0600  Gross per 24 hour  Intake    480 ml  Output      0 ml  Net    480 ml   Physical Exam:  BP 119/64 mmHg  Pulse 77  Temp(Src) 97 F (36.1 C) (Oral)  Resp 17  Ht 6' (1.829 m)  Wt 87.9 kg (193 lb 12.6 oz)  BMI 26.28 kg/m2  SpO2 93%  General: Elderly AAM alert, and responsive , on HD Head: HOH Neck: No JVD Lungs: Clear  Dressing at site of [facility] removal r chest Heart: Regular with S1 S2. No murmurs, rubs, or gallops appreciated. Abdomen: Soft, non-tender, non-distended   Lower extremities:without edema or ischemic changes  Dialysis Access: Left upper AVGG + bruit  Dialysis:  [facility]  4h  89.5kg  2/2.25 bath  Heparin none (hx SDH) R IJ cath (removed here 2/18) / LUA AVGG Venofer 50 per week  Mircera 200 q 2 weeks - last 2/8  Calcitriol 0.75 TIW   Background 82 y.o. male with ESRD on MWF HD with history of multiple medical issues including prior SDH 05/2015. He had a recent admission last month with AMS with no overt explanation. According to dialysis records and confirmed by phone call to the dialysis unit, his last dialysis was Monday, went to dialysis Wednesday, but sat in the lobby, refused to go it and has his wife called to pick him up. Told the dialysis staff that he just didn't feel right; he was very nonspecific. Today his wife tells me he did dialysis Wednesday and perhaps she thought he did. When I asked  his wife why he was here today she said he fell at home and then said, maybe it was yesterday, no maybe today. I asked her if he was able to get off the floor by himself and she said she helped and also called EMS. She tells me she drives him to and from dialysis and he has been getting home PT for deconditioning.   According to the admission H and P, EMS was called due to pt's inability to ambulate and increasing confusion. Pt was unable to articulate why here. Evaluation in the ED showed neg CXR, Head CT neg for acute changes. WBC 3.9 BUN 80 and Cr 10.6 K CO2 19 K 4.9 He received 1 L IVF in the ED temp was 100.1 BP was not low.EKG negative. Blood cultures + for pseudomonas.   Assessment/Plan: 1 AMS - dementia./ GN sepsis, better 2 Pseudomonas cath sepsis - cath removed, using LUA AVG. On [place] w HD 3 HTN stable 4 Vol stable 5 MBD cont meds 6 Anemia cont esa 7 DNR 8 Dementia 9 Seizure d/o 10 Dispo - possible dc today   Rob  Sira Adsit MD [facility] pager [phone]    cell [phone] 10/27/2015, 3:20 PM    Labs: Basic Metabolic Panel:  Recent Labs Lab 10/23/15 1108 10/24/15 0717 10/26/15 0555 10/27/15 1347  NA 141 142 137 138  K 4.9 4.2 3.9 3.9  CL 103 103 97* 96*  CO2 19* 22 29 26   GLUCOSE 199* 198* 175* 197*  BUN 80* 44* 37* 62*  CREATININE 10.61* 7.47* 6.67* 9.19*  CALCIUM 10.1 8.5* 8.5* 8.8*  PHOS  --  5.6* 4.0 5.3*   Liver Function Tests:  Recent Labs Lab 10/23/15 1108 10/24/15 0717 10/26/15 0555 10/27/15 1347  AST 23  --   --   --   ALT 13*  --   --   --   ALKPHOS 46  --   --   --   BILITOT 0.8  --   --   --   PROT 7.4  --   --   --   ALBUMIN 4.1 3.1* 2.8* 3.0*     Recent Labs Lab 10/24/15 0717 10/25/15 1704 10/26/15 0555 10/27/15 1346  WBC 5.2 3.1* 3.1* 2.5*  NEUTROABS  --   --   --  1.3*  HGB 10.0* 9.9* 9.7* 8.9*  HCT 30.3* 31.0* 29.5* 27.4*  MCV 99.3 100.0 100.3* 96.8  PLT 54* 41* 46* 49*    Recent Labs Lab  10/26/15 2033 10/27/15 0009 10/27/15 0413 10/27/15 0759 10/27/15 1214  GLUCAP 181* 101* 103* 110* 160*   Results for TRAQUAN, PILLADO (MRN [id number]) as of 10/25/2015 10:54  Ref. Range 10/25/2015 05:46  Procalcitonin Latest Units: ng/mL 135.29    Medications:   . calcitRIOL  0.75 mcg Oral Q M,W,F-HD  . calcium acetate  2,001 mg Oral TID WC  . cefTAZidime (FORTAZ)  IV  2 g Intravenous Q M,W,F-HD  . cinacalcet  30 mg Oral Q supper  . docusate sodium  100 mg Oral BID  . feeding supplement (NEPRO CARB STEADY)  237 mL Oral BID BM  . [START ON 10/29/2015] ferric gluconate (FERRLECIT/NULECIT) IV  62.5 mg Intravenous Q Wed-HD  . folic acid  1 mg Oral Daily  . gabapentin  100 mg Oral Daily  . insulin aspart  0-9 Units Subcutaneous 6 times per day  . lacosamide  100 mg Oral BID  . lacosamide  50 mg Oral Q M,W,F-HD  . pantoprazole  40 mg Oral BID   Dialysis Orders: Center: [place] MWF 4 hr  EDW 89.5   2K 2.25 Ca  heparin none (prior SDH)  venofer 50 per week  mircera 200 q 2 weeks - last 2/8  calcitriol 0.75 TIW right IJ removed 2/18 Left upper AVGG using now with 17 ga needles  Background 82 y.o. male with ESRD on MWF HD with history of multiple medical issues including prior SDH 05/2015. He had a recent admission last month with AMS with no overt explanation. According to dialysis records and confirmed by phone call to the dialysis unit, his last dialysis was Monday, went to dialysis Wednesday, but sat in the lobby, refused to go it and has his wife called to pick him up. Told the dialysis staff that he just didn't feel right; he was very nonspecific. Today his wife tells me he did dialysis Wednesday and perhaps she thought he did. When I asked his wife why he was here today she said he fell at home and then said, maybe it was yesterday, no maybe today. I asked her if he was  able to get off the floor by himself and she said she helped and also called EMS. She tells me she drives him to  and from dialysis and he has been getting home PT for deconditioning.   According to the admission H and P, EMS was called due to pt's inability to ambulate and increasing confusion. Pt was unable to articulate why here. Evaluation in the ED showed neg CXR, Head CT neg for acute changes. WBC 3.9 BUN 80 and Cr 10.6 K CO2 19 K 4.9 He received 1 L IVF in the ED temp was 100.1 BP was not low.EKG negative. Blood cultures + for pseudomonas.   Assessment/Plan: 1. Acute encephalopathy - work up per primary. Underlying dementia tipped over by GNR sepsis. Seems much improved to me re level of alertness and awareness of some of his issues.  2. Pseudomonas bacteremia - 2/2 blood cultures. UC negative. TDC out 2/18.  Certainly this could have contributed to MS changes acutely. On vanco (? Stop) and zosyn changed to [place]. Need duration of therapy (anticipate minimum of 2 weeks from date of [facility] removal 2/18 3. Probable underlying dementia 4. ESRD - MWF - HD yesterday off schedule. Next HD tomorrow. Using AVG w/17 ga needles - advance needle gauge as tolerated TDC out 2/18. 5. Hypertension/volume - variable - getting +/- edw - when feeling better, need to be certain he is NOT orthostatic as he ambulates on his own. Last post HD weight 89.1 so prob new EDW 89. 6. Coffee ground emesis - Protonix changed to po.  Hb 11.2->10->9.7 today. Does not get heparin with HD (prior SDH) 7. Anemia - Due for redose ESA Mircera 200 on 2/24 - Recheck tsat w/HD tomorrow.  8. Metabolic bone disease -  continue same /sensipar/phoslo 9. Nutrition - renal diet/vit/supplements alb 4.1 7.      Seizure disorder - on Vimpat. Pharmacy wants to give supplemental post HD dose -- OK with me. 8.      DNR - pt has some degree of dementia as does I think his wife- at the very least she has short term memory issues; I think this is very difficult for them as they are quite highly educated. Primary team very appropriately is discussion  option of Palliative Care evaluation (his Medical Director Dr. Marisue Humble feels he has been making very poor decisions with regards to his dialysis - signing off, etc), doing less well overall and he would agree with this evaluation as well). Pall Care has seen - see their note.  Camille Bal, MD [facility] [phone] pager 10/27/2015, 3:14 PM

## 2015-10-27 NOTE — Discharge Summary (Signed)
Physician Discharge Summary  [name] MWN:[id number] DOB: 04-16-1932 DOA: 10/23/2015  PCP: [name], DO  Admit date: 10/23/2015 Discharge date: 10/27/2015  Time spent: 35 minutes  Recommendations for Outpatient Follow-up:  1. Hospice recommended to follow-up as an outpatient with patient regarding goals of care as well as further medical decision making-it seems if the acidosis is not the reason for his hospice referral he can get both dialysis and hospice services 2. Patient to complete Ceftiazadime on 11/06/15 and patient should get follow-up blood cultures performed 1 week subsequent to this to denote clearance of bacteremia-was being treated for Pseudomonas on this admission likely secondary to temporary dialysis catheter that was left indwelling 3. Patient should continue Protonix 40 mg twice a day until follows up with Dr. Elnoria Howard as an outpatient 4. Patient has been recommended take Tylenol before dialysis to prevent back pain and allow for comfort dialysis  Discharge Diagnoses:  Principal Problem:   Acute encephalopathy Active Problems:   Hypertension   Anemia in chronic kidney disease   Thrombocytopenia (HCC)   Diabetes type 2, controlled (HCC)   ESRD on dialysis (HCC)   Failure to thrive in adult   Seizure disorder (HCC)   Dementia   Metabolic encephalopathy   Discharge Condition: Improved  Diet recommendation: Renal  Filed Weights   10/25/15 1135 10/26/15 0500 10/27/15 0500  Weight: 89.1 kg (196 lb 6.9 oz) 91.1 kg (200 lb 13.4 oz) 92.5 kg (203 lb 14.8 oz)    History of present illness: Brief narrative: 82 year old male Known end-stage renal disease Monday Wednesday Friday-still passes urine 3-4 times a day--has a history of intermittent noncompliance Hypertension Bradycardia, junctional-first-degree AV block without any conduction system anomaly noted by EP and not a candidate for pacemaker Diastolic heart failure grade 1 based on TTE 01/14/13 EF 55-60% P ASP  71 History subdural hematoma 9/16 right sided subdural 07/26/15 left-sided subdural secondary to recurrent falls-no midline shift Anemia Diabetes mellitus2  Admitted with toxic metabolic encephalopathy and eventually found to have 2/2 blood cultures growing Pseudomonas Patient tells me that his temperature dialysis catheter that was placed in right chest started aching and pain in about one month after it was placed and this has been in situ 4. If time Also noted patient has been noncompliant with dialysis at times because of a variety of reasons-signing off early because of discomfort in the back and feels like he is not being explained how much fluid there taken off in a setting of goal of trying to lower EDW   Hospital Course:   1. Bacteremia secondary to temporary dialysis catheter tmax 100 and elevated procalcitonin. 2/2 bottles Blood culture 2/16 = Pseudomonas . Transitioned to Ceftazidime c HD-- infectious disease physician recommended that patient  For a duration of 2 weeks total ending 11/15/15 with rpt surveillance cultures 1 week  Subsequently on ~ 11/21/15--If these are +, suggest Echo r/o Endocarditis 2. ? GI bleed, history colonoscopy 07/2006, 08/2007-EGD = esophagitis, colonoscopies showed diverticula and capsule 2008 negative for AVM suspicious bleeds--patient had 200 cc of coffee ground emesis and was placed on Protonix gtt while on dialysis. subsequently tolerating diet.  Will need follow-up with Dr. Elnoria Howard as an outpatient-cannot use non-steroidals.  Protonix 40 bid on d/c home 3. Toxic metabolic encephalopathy-probably secondary to #1 4. Prior subdural hemorrhages with seizures-may acccount for altered mentation. continue Vimpat 100 twice a day as well as 50 mg Monday Wednesday Friday 5. Hypertension-Cut back amlodipine 10 mg and do not administer given hypotension  earlier this admission--will need ambulation and orthostatics prior to discharge as nephrology is attempting to  lower EDW 6. Prior Sz disorder.  On 2 different doses of Vimpat-continue as OP 7. Diabetes mellitus with diabetic neuropathy/nephropathy-continue gabapentin 100 daily 8. ESRD MWF-we will need to determine if patient is a viable dialysis candidate ongoing. Seems to sign off early and has had 5 admissions recently . We explored the remainder of the above to completely control his pain at dialysis and that he may need to premedicate with Tylenol prior to dialysis. I discussed with Dr. Eliott Nine who concurs that tramadol is not a good option because this can make him lethargic and no medications for back pain can be given at dialysis time at an outpatient as that is not the protocol. I did mention to patient and son and wife that it will be a good idea to make an appointment to meet Dr. Marisue Humble and discuss some of these issues ongoing. Family understands 9. Metabolic bone disease-per nephrology 10. Anemia of renal disease-getting IV iron with dialysis  Procedures: tdc removal 2/18 dialysis  Consultations: nephrology Palliative care  Discharge Exam: Filed Vitals:   10/26/15 2107 10/27/15 0400  BP: 154/65 145/62  Pulse: 72 69  Temp: 98.1 F (36.7 C) 97.7 F (36.5 C)  Resp: 19 18    General: alert pleasant oriented in NAD No acute other issues tol diet  ambulatory no dizzyness Cardiovascular: s1 s2 no m/r/g Respiratory: clear no added sound  Discharge Instructions    Current Discharge Medication List    CONTINUE these medications which have NOT CHANGED   Details  amLODipine (NORVASC) 10 MG tablet Take 10 mg by mouth Daily.     calcium acetate (PHOSLO) 667 MG capsule Take 3 capsules by mouth three times a day Qty: 180 capsule, Refills: 3    colchicine 0.6 MG tablet Take 1 tablet (0.6 mg total) by mouth as needed. For gout. Patient needs an appointment before anymore refills. Qty: 30 tablet, Refills: 0    darbepoetin (ARANESP) 60 MCG/0.3ML SOLN injection Inject 0.3 mLs (60 mcg  total) into the vein every Thursday with hemodialysis. Qty: 4.2 mL    Diphenhydramine-APAP 25-500 MG TABS Take 1 tablet by mouth at bedtime as needed (sleep). For sleep    docusate sodium (COLACE) 100 MG capsule Take 1 capsule (100 mg total) by mouth 2 (two) times daily. Qty: 60 capsule, Refills: 0    folic acid (FOLVITE) 1 MG tablet Take 1 tablet (1 mg total) by mouth daily. Qty: 30 tablet, Refills: 0    HUMALOG KWIKPEN 100 UNIT/ML KiwkPen Inject 0-10 Units into the skin 3 (three) times daily. Sliding Scale Refills: 0    !! lacosamide (VIMPAT) 50 MG TABS tablet Take 1 tablet (50 mg total) by mouth every Monday, Wednesday, and Friday with hemodialysis. Qty: 15 tablet, Refills: 1    !! Lacosamide 100 MG TABS Take 1 tablet (100 mg total) by mouth 2 (two) times daily. Qty: 60 tablet, Refills: 3   Associated Diagnoses: Secondary seizure disorder (HCC)    lidocaine (LIDODERM) 5 % Place 1 patch onto the skin daily. Remove & Discard patch within 12 hours or as directed by MD Qty: 30 patch, Refills: 0    sodium chloride 0.9 % SOLN 100 mL with ferric gluconate 12.5 MG/ML SOLN 62.5 mg Inject 62.5 mg into the vein every Thursday with hemodialysis.    traMADol (ULTRAM) 50 MG tablet Take one tablet by mouth every 6 hours as needed  for pain. Patient needs appointment before anymore refills. Qty: 120 tablet, Refills: 0    calcitRIOL (ROCALTROL) 0.5 MCG capsule Take 2 capsules (1 mcg total) by mouth every Monday, Wednesday, and Friday with hemodialysis.    cinacalcet (SENSIPAR) 30 MG tablet Take 1 tablet (30 mg total) by mouth daily with supper.    gabapentin (NEURONTIN) 100 MG capsule Take 1 capsule (100 mg total) by mouth daily. Qty: 30 capsule, Refills: 0   Associated Diagnoses: Degenerative disc disease, lumbar    multivitamin (RENA-VIT) TABS tablet Take 1 tablet by mouth at bedtime. Qty: 30 tablet, Refills: 0    Nutritional Supplements (FEEDING SUPPLEMENT, NEPRO CARB STEADY,) LIQD Take  237 mLs by mouth 2 (two) times daily between meals.     !! - Potential duplicate medications found. Please discuss with provider.     Allergies  Allergen Reactions  . Aspirin Other (See Comments)    Bleeding   . Lactose Intolerance (Gi) Diarrhea      The results of significant diagnostics from this hospitalization (including imaging, microbiology, ancillary and laboratory) are listed below for reference.    Significant Diagnostic Studies: Dg Chest 2 View  10/23/2015  CLINICAL DATA:  Altered mental status.  ESRD EXAM: CHEST  2 VIEW COMPARISON:  09/15/2015 FINDINGS: Right jugular dialysis catheter in the right atrium unchanged. Heart size normal. Normal vascularity. No edema or effusion. Negative for pneumonia. IMPRESSION: No active cardiopulmonary disease. Electronically Signed   By: Marlan Palau M.D.   On: 10/23/2015 10:42   Ct Head Wo Contrast  10/23/2015  CLINICAL DATA:  Altered mental status.  ESRD. EXAM: CT HEAD WITHOUT CONTRAST TECHNIQUE: Contiguous axial images were obtained from the base of the skull through the vertex without intravenous contrast. COMPARISON:  09/15/2015 FINDINGS: Image quality degraded by motion multiple repeat images performed Moderate atrophy. Moderate to advanced chronic microvascular ischemic change throughout the white matter, similar to the prior CT. Negative for acute infarct. Negative for acute hemorrhage or mass lesion. Calvarium intact.  Mild mucosal edema paranasal sinuses. IMPRESSION: Atrophy and chronic microvascular ischemia unchanged from the prior study. No acute abnormality. Electronically Signed   By: Marlan Palau M.D.   On: 10/23/2015 10:30   Ir Removal Tun Cv Cath W/o Fl  10/25/2015  INDICATION: Bacteremia EXAM: REMOVAL TUNNELED CENTRAL VENOUS CATHETER MEDICATIONS: None ANESTHESIA/SEDATION: None FLUOROSCOPY TIME:  None COMPLICATIONS: None immediate. PROCEDURE: Informed written consent was obtained from the patient after a thorough discussion  of the procedural risks, benefits and alternatives. All questions were addressed. Maximal Sterile Barrier Technique was utilized including caps, mask, sterile gowns, sterile gloves, sterile drape, hand hygiene and skin antiseptic. A timeout was performed prior to the initiation of the procedure. The right chest was prepped with Betadine in a sterile fashion, and a sterile drape was applied covering the operative field. A sterile gown and sterile gloves were used for the procedure. 1% lidocaine was utilized for local anesthesia. Utilizing blunt dissection, the cuff of the catheter was freed from the underlying subcutaneous tissue. The catheter was removed in its entirety. Hemostasis was achieved with direct pressure. IMPRESSION: Successful tunneled dialysis catheter removal. Electronically Signed   By: Jolaine Click M.D.   On: 10/25/2015 14:49    Microbiology: Recent Results (from the past 240 hour(s))  Culture, blood (routine x 2)     Status: None   Collection Time: 10/23/15 10:50 AM  Result Value Ref Range Status   Specimen Description BLOOD LEFT LEG  Final   Special  Requests BOTTLES DRAWN AEROBIC AND ANAEROBIC 5CC  Final   Culture  Setup Time   Final    GRAM NEGATIVE RODS IN BOTH AEROBIC AND ANAEROBIC BOTTLES CRITICAL RESULT CALLED TO, READ BACK BY AND VERIFIED WITH: J BOZEMAN,RN AT 1055 10/24/15 BY L BENFIELD    Culture   Final    PSEUDOMONAS AERUGINOSA SUSCEPTIBILITIES PERFORMED ON PREVIOUS CULTURE WITHIN THE LAST 5 DAYS.    Report Status 10/27/2015 FINAL  Final  Culture, blood (routine x 2)     Status: None   Collection Time: 10/23/15 10:58 AM  Result Value Ref Range Status   Specimen Description BLOOD RIGHT FOREARM  Final   Special Requests BOTTLES DRAWN AEROBIC ONLY 5CC  Final   Culture  Setup Time   Final    GRAM NEGATIVE RODS AEROBIC BOTTLE ONLY CRITICAL RESULT CALLED TO, READ BACK BY AND VERIFIED WITH: J BOZEMAN 10/24/15 @ 0949 M VESTAL    Culture PSEUDOMONAS AERUGINOSA  Final    Report Status 10/26/2015 FINAL  Final   Organism ID, Bacteria PSEUDOMONAS AERUGINOSA  Final      Susceptibility   Pseudomonas aeruginosa - MIC*    CEFTAZIDIME 4 SENSITIVE Sensitive     CIPROFLOXACIN <=0.25 SENSITIVE Sensitive     GENTAMICIN 2 SENSITIVE Sensitive     IMIPENEM 2 SENSITIVE Sensitive     PIP/TAZO 8 SENSITIVE Sensitive     CEFEPIME 4 SENSITIVE Sensitive     * PSEUDOMONAS AERUGINOSA  Urine culture     Status: None   Collection Time: 10/23/15  2:00 PM  Result Value Ref Range Status   Specimen Description URINE, CATHETERIZED  Final   Special Requests Normal  Final   Culture NO GROWTH 1 DAY  Final   Report Status 10/24/2015 FINAL  Final     Labs: Basic Metabolic Panel:  Recent Labs Lab 10/23/15 1108 10/24/15 0717 10/26/15 0555  NA 141 142 137  K 4.9 4.2 3.9  CL 103 103 97*  CO2 19* 22 29  GLUCOSE 199* 198* 175*  BUN 80* 44* 37*  CREATININE 10.61* 7.47* 6.67*  CALCIUM 10.1 8.5* 8.5*  PHOS  --  5.6* 4.0   Liver Function Tests:  Recent Labs Lab 10/23/15 1108 10/24/15 0717 10/26/15 0555  AST 23  --   --   ALT 13*  --   --   ALKPHOS 46  --   --   BILITOT 0.8  --   --   PROT 7.4  --   --   ALBUMIN 4.1 3.1* 2.8*   No results for input(s): LIPASE, AMYLASE in the last 168 hours. No results for input(s): AMMONIA in the last 168 hours. CBC:  Recent Labs Lab 10/23/15 1108 10/24/15 0120 10/24/15 0717 10/25/15 1704 10/26/15 0555  WBC 3.9* 3.6* 5.2 3.1* 3.1*  HGB 11.2* 10.0* 10.0* 9.9* 9.7*  HCT 35.2* 31.1* 30.3* 31.0* 29.5*  MCV 99.7 99.4 99.3 100.0 100.3*  PLT 87* 50* 54* 41* 46*   Cardiac Enzymes: No results for input(s): CKTOTAL, CKMB, CKMBINDEX, TROPONINI in the last 168 hours. BNP: BNP (last 3 results) No results for input(s): BNP in the last 8760 hours.  ProBNP (last 3 results) No results for input(s): PROBNP in the last 8760 hours.  CBG:  Recent Labs Lab 10/26/15 1536 10/26/15 2033 10/27/15 0009 10/27/15 0413 10/27/15 0759   GLUCAP 264* 181* 101* 103* 110*       Signed:  Rhetta Mura MD   [facility] 10/27/2015, 11:35 AM

## 2015-10-27 NOTE — Care Management Note (Signed)
Case Management Note  Patient Details  Name: [name] MRN: [id number] Date of Birth: 18-Nov-1931  Subjective/Objective:                    Action/Plan:  Bambi from [facility] spoke with family and patient . They have decided to discharge home with home health , but they have Bambi's contact information for when they decide to change to hospice services . Paged MD for home health orders and called [facility]  Expected Discharge Date:                  Expected Discharge Plan:  Home w Home Health Services  In-House Referral:     Discharge planning Services  CM Consult  Post Acute Care Choice:    Choice offered to:  Adult Children  DME Arranged:    DME Agency:     HH Arranged:  RN, PT, OT, Social Work [employer] Agency:  [facility]  Status of Service:  Completed, signed off  Medicare Important Message Given:  Yes Date Medicare IM Given:    Medicare IM give by:    Date Additional Medicare IM Given:    Additional Medicare Important Message give by:     If discussed at Long Length of Stay Meetings, dates discussed:    Additional Comments:  Kingsley Plan, RN 10/27/2015, 1:04 PM

## 2015-10-27 NOTE — Care Management Important Message (Signed)
Important Message  Patient Details  Name: [name] MRN: [id number] Date of Birth: [birth year]   Medicare Important Message Given:  Yes    Bernadette Hoit 10/27/2015, 11:39 AM

## 2015-10-27 NOTE — Progress Notes (Signed)
Dialysis treatment completed.  1200 mL ultrafiltrated and net fluid removal 700 mL.    Patient A/Ox3, verbal, and coherent. Lung sounds clear to ausculation in all fields. No edema.   Disconnected lines and removed needles.  Pressure held for 14 minutes and band aid/gauze dressing applied.  Report given to bedside RN, Leota Jacobsen.

## 2015-10-27 NOTE — Progress Notes (Signed)
Patient arrived to unit per bed.  Reviewed treatment plan and this RN agrees.  Report received from Hemodialysis Nurse Lucienne Minks who received the report from bedside RN, Leota Jacobsen. Consent signed.  Patient A/Ox3, conversant, and coherent.. Lung sounds clear to ausculation in all fields. No edema. No JVD. Cardiac: HR Regular, denies any chest discomfort..  Prepped LUA AVG with alcohol and cannulated with two 17 gauge needles.  Pulsation of blood noted.  Flushed access well with saline per protocol.  Connected and secured lines and initiated tx at 1309.  UF goal of 1200 mL and net fluid removal of 700 mL.  Will continue to monitor.

## 2015-10-27 NOTE — Care Management Note (Signed)
Case Management Note  Patient Details  Name: [name] MRN: [id number] Date of Birth: 1931-09-18  Subjective/Objective:                    Action/Plan:  Received consult , wife wanting to talk to [facility] to discuss services , before deciding on discharge plan.  Family not present at bedside at present , called home phone number , son Chidubem Chaires answered and confirmed same . Offered choice , [name] would like [facility] to call him directly at [phone] . Brandi at [facility] given information . Information faxed to [facility] [phone].   Expected Discharge Date:                  Expected Discharge Plan:     In-House Referral:     Discharge planning Services     Post Acute Care Choice:    Choice offered to:  Adult Children  DME Arranged:    DME Agency:     HH Arranged:    HH Agency:     Status of Service:  In process, will continue to follow  Medicare Important Message Given:    Date Medicare IM Given:    Medicare IM give by:    Date Additional Medicare IM Given:    Additional Medicare Important Message give by:     If discussed at Long Length of Stay Meetings, dates discussed:    Additional Comments:  Kingsley Plan, RN 10/27/2015, 10:41 AM

## 2015-10-28 DIAGNOSIS — N186 End stage renal disease: Secondary | ICD-10-CM | POA: Diagnosis not present

## 2015-10-28 DIAGNOSIS — I120 Hypertensive chronic kidney disease with stage 5 chronic kidney disease or end stage renal disease: Secondary | ICD-10-CM | POA: Diagnosis not present

## 2015-10-28 DIAGNOSIS — M545 Low back pain: Secondary | ICD-10-CM | POA: Diagnosis not present

## 2015-10-28 DIAGNOSIS — E1122 Type 2 diabetes mellitus with diabetic chronic kidney disease: Secondary | ICD-10-CM | POA: Diagnosis not present

## 2015-10-29 DIAGNOSIS — D631 Anemia in chronic kidney disease: Secondary | ICD-10-CM | POA: Diagnosis not present

## 2015-10-29 DIAGNOSIS — D509 Iron deficiency anemia, unspecified: Secondary | ICD-10-CM | POA: Diagnosis not present

## 2015-10-29 DIAGNOSIS — E1129 Type 2 diabetes mellitus with other diabetic kidney complication: Secondary | ICD-10-CM | POA: Diagnosis not present

## 2015-10-29 DIAGNOSIS — A4152 Sepsis due to Pseudomonas: Secondary | ICD-10-CM | POA: Diagnosis not present

## 2015-10-29 DIAGNOSIS — N186 End stage renal disease: Secondary | ICD-10-CM | POA: Diagnosis not present

## 2015-10-29 DIAGNOSIS — D689 Coagulation defect, unspecified: Secondary | ICD-10-CM | POA: Diagnosis not present

## 2015-10-30 DIAGNOSIS — I120 Hypertensive chronic kidney disease with stage 5 chronic kidney disease or end stage renal disease: Secondary | ICD-10-CM | POA: Diagnosis not present

## 2015-10-30 DIAGNOSIS — M545 Low back pain: Secondary | ICD-10-CM | POA: Diagnosis not present

## 2015-10-30 DIAGNOSIS — E1122 Type 2 diabetes mellitus with diabetic chronic kidney disease: Secondary | ICD-10-CM | POA: Diagnosis not present

## 2015-10-30 DIAGNOSIS — N186 End stage renal disease: Secondary | ICD-10-CM | POA: Diagnosis not present

## 2015-10-31 DIAGNOSIS — E1129 Type 2 diabetes mellitus with other diabetic kidney complication: Secondary | ICD-10-CM | POA: Diagnosis not present

## 2015-10-31 DIAGNOSIS — A4152 Sepsis due to Pseudomonas: Secondary | ICD-10-CM | POA: Diagnosis not present

## 2015-10-31 DIAGNOSIS — D689 Coagulation defect, unspecified: Secondary | ICD-10-CM | POA: Diagnosis not present

## 2015-10-31 DIAGNOSIS — D631 Anemia in chronic kidney disease: Secondary | ICD-10-CM | POA: Diagnosis not present

## 2015-10-31 DIAGNOSIS — N186 End stage renal disease: Secondary | ICD-10-CM | POA: Diagnosis not present

## 2015-10-31 DIAGNOSIS — D509 Iron deficiency anemia, unspecified: Secondary | ICD-10-CM | POA: Diagnosis not present

## 2015-11-03 DIAGNOSIS — N186 End stage renal disease: Secondary | ICD-10-CM | POA: Diagnosis not present

## 2015-11-03 DIAGNOSIS — T82858D Stenosis of vascular prosthetic devices, implants and grafts, subsequent encounter: Secondary | ICD-10-CM | POA: Diagnosis not present

## 2015-11-03 DIAGNOSIS — Z992 Dependence on renal dialysis: Secondary | ICD-10-CM | POA: Diagnosis not present

## 2015-11-03 DIAGNOSIS — I871 Compression of vein: Secondary | ICD-10-CM | POA: Diagnosis not present

## 2015-11-04 DIAGNOSIS — Z9181 History of falling: Secondary | ICD-10-CM | POA: Diagnosis not present

## 2015-11-04 DIAGNOSIS — Z992 Dependence on renal dialysis: Secondary | ICD-10-CM | POA: Diagnosis not present

## 2015-11-04 DIAGNOSIS — Z79891 Long term (current) use of opiate analgesic: Secondary | ICD-10-CM | POA: Diagnosis not present

## 2015-11-04 DIAGNOSIS — M545 Low back pain: Secondary | ICD-10-CM | POA: Diagnosis not present

## 2015-11-04 DIAGNOSIS — E1122 Type 2 diabetes mellitus with diabetic chronic kidney disease: Secondary | ICD-10-CM | POA: Diagnosis not present

## 2015-11-04 DIAGNOSIS — I120 Hypertensive chronic kidney disease with stage 5 chronic kidney disease or end stage renal disease: Secondary | ICD-10-CM | POA: Diagnosis not present

## 2015-11-04 DIAGNOSIS — N186 End stage renal disease: Secondary | ICD-10-CM | POA: Diagnosis not present

## 2015-11-04 DIAGNOSIS — Z794 Long term (current) use of insulin: Secondary | ICD-10-CM | POA: Diagnosis not present

## 2015-11-04 DIAGNOSIS — Z9115 Patient's noncompliance with renal dialysis: Secondary | ICD-10-CM | POA: Diagnosis not present

## 2015-11-04 DIAGNOSIS — M1990 Unspecified osteoarthritis, unspecified site: Secondary | ICD-10-CM | POA: Diagnosis not present

## 2015-11-04 DIAGNOSIS — I272 Other secondary pulmonary hypertension: Secondary | ICD-10-CM | POA: Diagnosis not present

## 2015-11-04 DIAGNOSIS — S065X0S Traumatic subdural hemorrhage without loss of consciousness, sequela: Secondary | ICD-10-CM | POA: Diagnosis not present

## 2015-11-05 DIAGNOSIS — N186 End stage renal disease: Secondary | ICD-10-CM | POA: Diagnosis not present

## 2015-11-05 DIAGNOSIS — D509 Iron deficiency anemia, unspecified: Secondary | ICD-10-CM | POA: Diagnosis not present

## 2015-11-05 DIAGNOSIS — D631 Anemia in chronic kidney disease: Secondary | ICD-10-CM | POA: Diagnosis not present

## 2015-11-05 DIAGNOSIS — A4152 Sepsis due to Pseudomonas: Secondary | ICD-10-CM | POA: Diagnosis not present

## 2015-11-06 DIAGNOSIS — Z9181 History of falling: Secondary | ICD-10-CM | POA: Diagnosis not present

## 2015-11-06 DIAGNOSIS — E1122 Type 2 diabetes mellitus with diabetic chronic kidney disease: Secondary | ICD-10-CM | POA: Diagnosis not present

## 2015-11-06 DIAGNOSIS — M545 Low back pain: Secondary | ICD-10-CM | POA: Diagnosis not present

## 2015-11-06 DIAGNOSIS — M1990 Unspecified osteoarthritis, unspecified site: Secondary | ICD-10-CM | POA: Diagnosis not present

## 2015-11-06 DIAGNOSIS — Z794 Long term (current) use of insulin: Secondary | ICD-10-CM | POA: Diagnosis not present

## 2015-11-06 DIAGNOSIS — S065X0S Traumatic subdural hemorrhage without loss of consciousness, sequela: Secondary | ICD-10-CM | POA: Diagnosis not present

## 2015-11-06 DIAGNOSIS — I120 Hypertensive chronic kidney disease with stage 5 chronic kidney disease or end stage renal disease: Secondary | ICD-10-CM | POA: Diagnosis not present

## 2015-11-06 DIAGNOSIS — N186 End stage renal disease: Secondary | ICD-10-CM | POA: Diagnosis not present

## 2015-11-06 DIAGNOSIS — Z992 Dependence on renal dialysis: Secondary | ICD-10-CM | POA: Diagnosis not present

## 2015-11-06 DIAGNOSIS — Z79891 Long term (current) use of opiate analgesic: Secondary | ICD-10-CM | POA: Diagnosis not present

## 2015-11-06 DIAGNOSIS — Z9115 Patient's noncompliance with renal dialysis: Secondary | ICD-10-CM | POA: Diagnosis not present

## 2015-11-06 DIAGNOSIS — I272 Other secondary pulmonary hypertension: Secondary | ICD-10-CM | POA: Diagnosis not present

## 2015-11-07 ENCOUNTER — Other Ambulatory Visit: Payer: Self-pay | Admitting: *Deleted

## 2015-11-07 DIAGNOSIS — A4152 Sepsis due to Pseudomonas: Secondary | ICD-10-CM | POA: Diagnosis not present

## 2015-11-07 DIAGNOSIS — N186 End stage renal disease: Secondary | ICD-10-CM | POA: Diagnosis not present

## 2015-11-07 DIAGNOSIS — D631 Anemia in chronic kidney disease: Secondary | ICD-10-CM | POA: Diagnosis not present

## 2015-11-07 DIAGNOSIS — D509 Iron deficiency anemia, unspecified: Secondary | ICD-10-CM | POA: Diagnosis not present

## 2015-11-07 MED ORDER — FOLIC ACID 1 MG PO TABS: 1.0000 mg | ORAL_TABLET | Freq: Every day | ORAL | Status: DC

## 2015-11-07 NOTE — Telephone Encounter (Signed)
[facility] [address] 

## 2015-11-10 DIAGNOSIS — D631 Anemia in chronic kidney disease: Secondary | ICD-10-CM | POA: Diagnosis not present

## 2015-11-10 DIAGNOSIS — A4152 Sepsis due to Pseudomonas: Secondary | ICD-10-CM | POA: Diagnosis not present

## 2015-11-10 DIAGNOSIS — D509 Iron deficiency anemia, unspecified: Secondary | ICD-10-CM | POA: Diagnosis not present

## 2015-11-10 DIAGNOSIS — N186 End stage renal disease: Secondary | ICD-10-CM | POA: Diagnosis not present

## 2015-11-11 ENCOUNTER — Ambulatory Visit: Payer: Medicare Other | Admitting: Podiatry

## 2015-11-11 DIAGNOSIS — Z9181 History of falling: Secondary | ICD-10-CM | POA: Diagnosis not present

## 2015-11-11 DIAGNOSIS — S065X0S Traumatic subdural hemorrhage without loss of consciousness, sequela: Secondary | ICD-10-CM | POA: Diagnosis not present

## 2015-11-11 DIAGNOSIS — I120 Hypertensive chronic kidney disease with stage 5 chronic kidney disease or end stage renal disease: Secondary | ICD-10-CM | POA: Diagnosis not present

## 2015-11-11 DIAGNOSIS — I272 Other secondary pulmonary hypertension: Secondary | ICD-10-CM | POA: Diagnosis not present

## 2015-11-11 DIAGNOSIS — M1990 Unspecified osteoarthritis, unspecified site: Secondary | ICD-10-CM | POA: Diagnosis not present

## 2015-11-11 DIAGNOSIS — E1122 Type 2 diabetes mellitus with diabetic chronic kidney disease: Secondary | ICD-10-CM | POA: Diagnosis not present

## 2015-11-11 DIAGNOSIS — Z794 Long term (current) use of insulin: Secondary | ICD-10-CM | POA: Diagnosis not present

## 2015-11-11 DIAGNOSIS — M545 Low back pain: Secondary | ICD-10-CM | POA: Diagnosis not present

## 2015-11-11 DIAGNOSIS — Z9115 Patient's noncompliance with renal dialysis: Secondary | ICD-10-CM | POA: Diagnosis not present

## 2015-11-11 DIAGNOSIS — N186 End stage renal disease: Secondary | ICD-10-CM | POA: Diagnosis not present

## 2015-11-11 DIAGNOSIS — Z992 Dependence on renal dialysis: Secondary | ICD-10-CM | POA: Diagnosis not present

## 2015-11-11 DIAGNOSIS — Z79891 Long term (current) use of opiate analgesic: Secondary | ICD-10-CM | POA: Diagnosis not present

## 2015-11-12 DIAGNOSIS — A4152 Sepsis due to Pseudomonas: Secondary | ICD-10-CM | POA: Diagnosis not present

## 2015-11-12 DIAGNOSIS — D509 Iron deficiency anemia, unspecified: Secondary | ICD-10-CM | POA: Diagnosis not present

## 2015-11-12 DIAGNOSIS — N186 End stage renal disease: Secondary | ICD-10-CM | POA: Diagnosis not present

## 2015-11-12 DIAGNOSIS — D631 Anemia in chronic kidney disease: Secondary | ICD-10-CM | POA: Diagnosis not present

## 2015-11-13 ENCOUNTER — Telehealth: Payer: Self-pay | Admitting: *Deleted

## 2015-11-13 ENCOUNTER — Other Ambulatory Visit: Payer: Self-pay | Admitting: Diagnostic Neuroimaging

## 2015-11-13 DIAGNOSIS — S065X0S Traumatic subdural hemorrhage without loss of consciousness, sequela: Secondary | ICD-10-CM | POA: Diagnosis not present

## 2015-11-13 DIAGNOSIS — Z794 Long term (current) use of insulin: Secondary | ICD-10-CM | POA: Diagnosis not present

## 2015-11-13 DIAGNOSIS — M1990 Unspecified osteoarthritis, unspecified site: Secondary | ICD-10-CM | POA: Diagnosis not present

## 2015-11-13 DIAGNOSIS — M545 Low back pain: Secondary | ICD-10-CM | POA: Diagnosis not present

## 2015-11-13 DIAGNOSIS — I120 Hypertensive chronic kidney disease with stage 5 chronic kidney disease or end stage renal disease: Secondary | ICD-10-CM | POA: Diagnosis not present

## 2015-11-13 DIAGNOSIS — E1122 Type 2 diabetes mellitus with diabetic chronic kidney disease: Secondary | ICD-10-CM | POA: Diagnosis not present

## 2015-11-13 DIAGNOSIS — Z992 Dependence on renal dialysis: Secondary | ICD-10-CM | POA: Diagnosis not present

## 2015-11-13 DIAGNOSIS — N186 End stage renal disease: Secondary | ICD-10-CM | POA: Diagnosis not present

## 2015-11-13 DIAGNOSIS — Z9181 History of falling: Secondary | ICD-10-CM | POA: Diagnosis not present

## 2015-11-13 DIAGNOSIS — I272 Other secondary pulmonary hypertension: Secondary | ICD-10-CM | POA: Diagnosis not present

## 2015-11-13 DIAGNOSIS — G40909 Epilepsy, unspecified, not intractable, without status epilepticus: Secondary | ICD-10-CM

## 2015-11-13 DIAGNOSIS — Z9115 Patient's noncompliance with renal dialysis: Secondary | ICD-10-CM | POA: Diagnosis not present

## 2015-11-13 DIAGNOSIS — Z79891 Long term (current) use of opiate analgesic: Secondary | ICD-10-CM | POA: Diagnosis not present

## 2015-11-13 MED ORDER — LACOSAMIDE 50 MG PO TABS: ORAL_TABLET | ORAL | Status: DC

## 2015-11-13 NOTE — Telephone Encounter (Signed)
LVM for son, [name] requesting he call back and schedule FU for patient, and he may schedule with phone staff. Earlier FU due to patient's recent hospitalization. Left office number.

## 2015-11-14 ENCOUNTER — Ambulatory Visit (INDEPENDENT_AMBULATORY_CARE_PROVIDER_SITE_OTHER): Payer: Medicare Other | Admitting: Podiatry

## 2015-11-14 DIAGNOSIS — B351 Tinea unguium: Secondary | ICD-10-CM

## 2015-11-14 DIAGNOSIS — D509 Iron deficiency anemia, unspecified: Secondary | ICD-10-CM | POA: Diagnosis not present

## 2015-11-14 DIAGNOSIS — M79676 Pain in unspecified toe(s): Secondary | ICD-10-CM

## 2015-11-14 DIAGNOSIS — E1140 Type 2 diabetes mellitus with diabetic neuropathy, unspecified: Secondary | ICD-10-CM | POA: Diagnosis not present

## 2015-11-14 DIAGNOSIS — N186 End stage renal disease: Secondary | ICD-10-CM | POA: Diagnosis not present

## 2015-11-14 DIAGNOSIS — A4152 Sepsis due to Pseudomonas: Secondary | ICD-10-CM | POA: Diagnosis not present

## 2015-11-14 DIAGNOSIS — D631 Anemia in chronic kidney disease: Secondary | ICD-10-CM | POA: Diagnosis not present

## 2015-11-14 NOTE — Progress Notes (Signed)
Patient ID: [name], male   DOB: Jan 25, 1932, 82 y.o.   MRN: [id number] Complaint:  Visit Type: Patient returns to my office for continued preventative foot care services. Complaint: Patient states" my nails have grown long and thick and become painful to walk and wear shoes" Patient has been diagnosed with DM with no foot complications. The patient presents for preventative foot care services. No changes to ROS  Podiatric Exam: Vascular: dorsalis pedis and posterior tibial pulses are palpable bilateral. Capillary return is immediate. Temperature gradient is WNL. Skin turgor WNL  Sensorium: Normal Semmes Weinstein monofilament test. Normal tactile sensation bilaterally. Nail Exam: Pt has thick disfigured discolored nails with subungual debris noted bilateral entire nail hallux through fifth toenails Ulcer Exam: There is no evidence of ulcer or pre-ulcerative changes or infection. Orthopedic Exam: Muscle tone and strength are WNL. No limitations in general ROM. No crepitus or effusions noted. Foot type and digits show no abnormalities. Bony prominences are unremarkable. Hallux limitus 1st MPJ right foot.  Contracted hammer toe second right.  HAV 1st MPJ left foot. Skin: No Porokeratosis. No infection or ulcers.    Diagnosis:  Onychomycosis, , Pain in right toe, pain in left toes,  Treatment & Plan Procedures and Treatment: Consent by patient was obtained for treatment procedures. The patient understood the discussion of treatment and procedures well. All questions were answered thoroughly reviewed. Debridement of mycotic and hypertrophic toenails, 1 through 5 bilateral and clearing of subungual debris. No ulceration, no infection noted. Return Visit-Office Procedure: Patient instructed to return to the office for a follow up visit 3 months for continued evaluation and treatment.  Helane Gunther DPM

## 2015-11-16 DIAGNOSIS — Z794 Long term (current) use of insulin: Secondary | ICD-10-CM | POA: Diagnosis not present

## 2015-11-16 DIAGNOSIS — S065X0S Traumatic subdural hemorrhage without loss of consciousness, sequela: Secondary | ICD-10-CM | POA: Diagnosis not present

## 2015-11-16 DIAGNOSIS — Z9181 History of falling: Secondary | ICD-10-CM | POA: Diagnosis not present

## 2015-11-16 DIAGNOSIS — Z992 Dependence on renal dialysis: Secondary | ICD-10-CM | POA: Diagnosis not present

## 2015-11-16 DIAGNOSIS — N186 End stage renal disease: Secondary | ICD-10-CM | POA: Diagnosis not present

## 2015-11-16 DIAGNOSIS — Z79891 Long term (current) use of opiate analgesic: Secondary | ICD-10-CM | POA: Diagnosis not present

## 2015-11-16 DIAGNOSIS — M1990 Unspecified osteoarthritis, unspecified site: Secondary | ICD-10-CM | POA: Diagnosis not present

## 2015-11-16 DIAGNOSIS — I272 Other secondary pulmonary hypertension: Secondary | ICD-10-CM | POA: Diagnosis not present

## 2015-11-16 DIAGNOSIS — E1122 Type 2 diabetes mellitus with diabetic chronic kidney disease: Secondary | ICD-10-CM | POA: Diagnosis not present

## 2015-11-16 DIAGNOSIS — Z9115 Patient's noncompliance with renal dialysis: Secondary | ICD-10-CM | POA: Diagnosis not present

## 2015-11-16 DIAGNOSIS — I120 Hypertensive chronic kidney disease with stage 5 chronic kidney disease or end stage renal disease: Secondary | ICD-10-CM | POA: Diagnosis not present

## 2015-11-16 DIAGNOSIS — M545 Low back pain: Secondary | ICD-10-CM | POA: Diagnosis not present

## 2015-11-17 DIAGNOSIS — N186 End stage renal disease: Secondary | ICD-10-CM | POA: Diagnosis not present

## 2015-11-17 DIAGNOSIS — D631 Anemia in chronic kidney disease: Secondary | ICD-10-CM | POA: Diagnosis not present

## 2015-11-17 DIAGNOSIS — D509 Iron deficiency anemia, unspecified: Secondary | ICD-10-CM | POA: Diagnosis not present

## 2015-11-17 DIAGNOSIS — A4152 Sepsis due to Pseudomonas: Secondary | ICD-10-CM | POA: Diagnosis not present

## 2015-11-18 DIAGNOSIS — D509 Iron deficiency anemia, unspecified: Secondary | ICD-10-CM | POA: Diagnosis not present

## 2015-11-18 DIAGNOSIS — Z9181 History of falling: Secondary | ICD-10-CM | POA: Diagnosis not present

## 2015-11-18 DIAGNOSIS — Z992 Dependence on renal dialysis: Secondary | ICD-10-CM | POA: Diagnosis not present

## 2015-11-18 DIAGNOSIS — I120 Hypertensive chronic kidney disease with stage 5 chronic kidney disease or end stage renal disease: Secondary | ICD-10-CM | POA: Diagnosis not present

## 2015-11-18 DIAGNOSIS — M545 Low back pain: Secondary | ICD-10-CM | POA: Diagnosis not present

## 2015-11-18 DIAGNOSIS — A4152 Sepsis due to Pseudomonas: Secondary | ICD-10-CM | POA: Diagnosis not present

## 2015-11-18 DIAGNOSIS — N186 End stage renal disease: Secondary | ICD-10-CM | POA: Diagnosis not present

## 2015-11-18 DIAGNOSIS — S065X0S Traumatic subdural hemorrhage without loss of consciousness, sequela: Secondary | ICD-10-CM | POA: Diagnosis not present

## 2015-11-18 DIAGNOSIS — Z794 Long term (current) use of insulin: Secondary | ICD-10-CM | POA: Diagnosis not present

## 2015-11-18 DIAGNOSIS — M1990 Unspecified osteoarthritis, unspecified site: Secondary | ICD-10-CM | POA: Diagnosis not present

## 2015-11-18 DIAGNOSIS — Z9115 Patient's noncompliance with renal dialysis: Secondary | ICD-10-CM | POA: Diagnosis not present

## 2015-11-18 DIAGNOSIS — D631 Anemia in chronic kidney disease: Secondary | ICD-10-CM | POA: Diagnosis not present

## 2015-11-18 DIAGNOSIS — E1122 Type 2 diabetes mellitus with diabetic chronic kidney disease: Secondary | ICD-10-CM | POA: Diagnosis not present

## 2015-11-18 DIAGNOSIS — I272 Other secondary pulmonary hypertension: Secondary | ICD-10-CM | POA: Diagnosis not present

## 2015-11-18 DIAGNOSIS — Z79891 Long term (current) use of opiate analgesic: Secondary | ICD-10-CM | POA: Diagnosis not present

## 2015-11-20 ENCOUNTER — Ambulatory Visit (INDEPENDENT_AMBULATORY_CARE_PROVIDER_SITE_OTHER): Payer: Medicare Other | Admitting: Nurse Practitioner

## 2015-11-20 VITALS — BP 144/36 | Temp 97.4°F | Wt 166.6 lb

## 2015-11-20 DIAGNOSIS — R7881 Bacteremia: Secondary | ICD-10-CM

## 2015-11-20 DIAGNOSIS — S065X0S Traumatic subdural hemorrhage without loss of consciousness, sequela: Secondary | ICD-10-CM | POA: Diagnosis not present

## 2015-11-20 DIAGNOSIS — Z9181 History of falling: Secondary | ICD-10-CM | POA: Diagnosis not present

## 2015-11-20 DIAGNOSIS — Z992 Dependence on renal dialysis: Secondary | ICD-10-CM | POA: Diagnosis not present

## 2015-11-20 DIAGNOSIS — D631 Anemia in chronic kidney disease: Secondary | ICD-10-CM | POA: Diagnosis not present

## 2015-11-20 DIAGNOSIS — R001 Bradycardia, unspecified: Secondary | ICD-10-CM | POA: Diagnosis not present

## 2015-11-20 DIAGNOSIS — K922 Gastrointestinal hemorrhage, unspecified: Secondary | ICD-10-CM

## 2015-11-20 DIAGNOSIS — M545 Low back pain: Secondary | ICD-10-CM | POA: Diagnosis not present

## 2015-11-20 DIAGNOSIS — I10 Essential (primary) hypertension: Secondary | ICD-10-CM | POA: Diagnosis not present

## 2015-11-20 DIAGNOSIS — I5032 Chronic diastolic (congestive) heart failure: Secondary | ICD-10-CM

## 2015-11-20 DIAGNOSIS — N186 End stage renal disease: Secondary | ICD-10-CM | POA: Diagnosis not present

## 2015-11-20 DIAGNOSIS — I120 Hypertensive chronic kidney disease with stage 5 chronic kidney disease or end stage renal disease: Secondary | ICD-10-CM | POA: Diagnosis not present

## 2015-11-20 DIAGNOSIS — I272 Other secondary pulmonary hypertension: Secondary | ICD-10-CM | POA: Diagnosis not present

## 2015-11-20 DIAGNOSIS — S065XAA Traumatic subdural hemorrhage with loss of consciousness status unknown, initial encounter: Secondary | ICD-10-CM

## 2015-11-20 DIAGNOSIS — G40909 Epilepsy, unspecified, not intractable, without status epilepticus: Secondary | ICD-10-CM | POA: Diagnosis not present

## 2015-11-20 DIAGNOSIS — M1990 Unspecified osteoarthritis, unspecified site: Secondary | ICD-10-CM | POA: Diagnosis not present

## 2015-11-20 DIAGNOSIS — Z79891 Long term (current) use of opiate analgesic: Secondary | ICD-10-CM | POA: Diagnosis not present

## 2015-11-20 DIAGNOSIS — E1121 Type 2 diabetes mellitus with diabetic nephropathy: Secondary | ICD-10-CM | POA: Diagnosis not present

## 2015-11-20 DIAGNOSIS — Z794 Long term (current) use of insulin: Secondary | ICD-10-CM | POA: Diagnosis not present

## 2015-11-20 DIAGNOSIS — N189 Chronic kidney disease, unspecified: Secondary | ICD-10-CM

## 2015-11-20 DIAGNOSIS — S065X9A Traumatic subdural hemorrhage with loss of consciousness of unspecified duration, initial encounter: Secondary | ICD-10-CM

## 2015-11-20 DIAGNOSIS — E1122 Type 2 diabetes mellitus with diabetic chronic kidney disease: Secondary | ICD-10-CM | POA: Diagnosis not present

## 2015-11-20 DIAGNOSIS — I6200 Nontraumatic subdural hemorrhage, unspecified: Secondary | ICD-10-CM | POA: Diagnosis not present

## 2015-11-20 DIAGNOSIS — Z9115 Patient's noncompliance with renal dialysis: Secondary | ICD-10-CM | POA: Diagnosis not present

## 2015-11-20 NOTE — Assessment & Plan Note (Signed)
?   GI bleed, history colonoscopy 07/2006, 08/2007-EGD = esophagitis, colonoscopies showed diverticula and capsule 2008 negative for AVM suspicious bleeds--patient had 200 cc of coffee ground emesis and was placed on Protonix gtt while on dialysis. subsequently tolerating diet. Will need follow-up with Dr. Elnoria Howard as an outpatient-cannot use non-steroidals. continue Protonix 40 bid

## 2015-11-20 NOTE — Assessment & Plan Note (Signed)
Blood culture--If these are +, suggest Echo r/o Endocarditis

## 2015-11-20 NOTE — Assessment & Plan Note (Signed)
Anemia of renal disease-getting IV iron with dialysis, continue Aranesp.

## 2015-11-20 NOTE — Assessment & Plan Note (Signed)
T2 DM  with diabetic neuropathy/nephropathy-continue gabapentin 100 daily, insulin per [facility]SI

## 2015-11-20 NOTE — Assessment & Plan Note (Signed)
Hx of orthostatic hypotension, may consider reduce Amlodipine to 5mg  daily, recheck BP after sitting down.

## 2015-11-20 NOTE — Assessment & Plan Note (Signed)
Bradycardia, junctional-first-degree AV block without any conduction system anomaly noted by EP and not a candidate for pacemaker

## 2015-11-20 NOTE — Progress Notes (Signed)
Patient ID: [name], male   DOB: 1931-12-10, 82 y.o.   MRN: [id number]   Location:   [facility]   Place of Service:   [facility] clinic Provider: Chipper Oman NP  Code Status: DNR Goals of Care:  Advanced Directives 11/20/2015  Does patient have an advance directive? Yes  Type of Advance Directive -  Does patient want to make changes to advanced directive? No - Patient declined  Copy of advanced directive(s) in chart? -     Chief Complaint  Patient presents with  . Hospitalization Follow-up    HPI: Patient is a 82 y.o. male seen today for hospital follow-up: hospitalized 10/23/15 to 10/27/15 for acute encephalopathy.  Admitted with toxic metabolic encephalopathy and eventually found to have 2/2 blood cultures growing Pseudomonas. Continue Hospice and Dialysis is his goal of care. completed Ceftiazadime on 11/06/15 and patient will need follow-up blood cultures performed 1 week subsequent to this to denote clearance of bacteremia-was being treated for Pseudomonas likely secondary to temporary dialysis catheter that was left indwelling. Continue Protonix 40 mg twice a day until follows up with Dr. Elnoria Howard as an outpatient  Hx of blood pressure, orthostatic hypotension, today 130s/30s, the patient is asymptomatic, may need to reduce Amlodipine from 10mg  to 5mg , the patient and his wife stated they have blood pressure monitor at home and able to adjust his med as needed  Hx of Gout, takes Colchicine chronically. Seizure is managed with Vimpat. T2DM blood sugar is managed with SSI. Last Hgb a1c 5.3 10/23/15. Chronic anemia, last Hgb 8.9 10/27/15, taking Fe IV at dialysis and Aranesp.   Past Medical History  Diagnosis Date  . Hypertension   . Diabetes mellitus without complication (HCC)   . Arthritis   . History of blood transfusion   . GI bleed   . ESRD (end stage renal disease) (HCC)   . Seizure (HCC)   . Dialysis patient (HCC)   . Subdural hematoma (HCC) 05/2015    from fall  . Sleep apnea     wears  CPAP n/c  . Pulmonary hypertension (HCC)     severe pulmonary HTN by 01/14/13 echo    Past Surgical History  Procedure Laterality Date  . Esophagogastroduodenoscopy  10/03/2012    Procedure: ESOPHAGOGASTRODUODENOSCOPY (EGD);  Surgeon: Theda Belfast, MD;  Location: [facility] ENDOSCOPY;  Service: Endoscopy;  Laterality: N/A;  . Colonoscopy  10/04/2012    Procedure: COLONOSCOPY;  Surgeon: Theda Belfast, MD;  Location: [facility] ENDOSCOPY;  Service: Endoscopy;  Laterality: N/A;  . Colonoscopy with esophagogastroduodenoscopy (egd) Left 11/30/2012    Procedure: COLONOSCOPY WITH ESOPHAGOGASTRODUODENOSCOPY (EGD);  Surgeon: Theda Belfast, MD;  Location: [facility] ENDOSCOPY;  Service: Endoscopy;  Laterality: Left;  . Spine surgery  2004    back fusion/ [facility] Penumalli,MD  . Retinal laser procedure Right 04/2015  . Av fistula placement Left 09/09/2015    Procedure: INSERTION OF ARTERIOVENOUS  GORE-TEX GRAFT Left  ARM;  Surgeon: Sherren Kerns, MD;  Location: [facility] OR;  Service: Vascular;  Laterality: Left;    Allergies  Allergen Reactions  . Aspirin Other (See Comments)    Bleeding   . Lactose Intolerance (Gi) Diarrhea      Medication List       This list is accurate as of: 11/20/15 11:00 AM.  Always use your most recent med list.               amLODipine 10 MG tablet  Commonly known as:  NORVASC  Take 5  mg by mouth Daily.     calcitRIOL 0.5 MCG capsule  Commonly known as:  ROCALTROL  Take 2 capsules (1 mcg total) by mouth every Monday, Wednesday, and Friday with hemodialysis.     calcium acetate 667 MG capsule  Commonly known as:  PHOSLO  Take 3 capsules by mouth three times a day     cefTAZidime 2 g in dextrose 5 % 50 mL  Inject 2 g into the vein every Monday, Wednesday, and Friday with hemodialysis.     cinacalcet 30 MG tablet  Commonly known as:  SENSIPAR  Take 1 tablet (30 mg total) by mouth daily with supper.     colchicine 0.6 MG tablet  Take 1 tablet (0.6 mg total) by mouth as needed. For  gout. Patient needs an appointment before anymore refills.     darbepoetin 60 MCG/0.3ML Soln injection  Commonly known as:  ARANESP  Inject 0.3 mLs (60 mcg total) into the vein every Thursday with hemodialysis.     Diphenhydramine-APAP 25-500 MG Tabs  Take 1 tablet by mouth at bedtime as needed (sleep). For sleep     docusate sodium 100 MG capsule  Commonly known as:  COLACE  Take 1 capsule (100 mg total) by mouth 2 (two) times daily.     feeding supplement (NEPRO CARB STEADY) Liqd  Take 237 mLs by mouth 2 (two) times daily between meals.     folic acid 1 MG tablet  Commonly known as:  FOLVITE  Take 1 tablet (1 mg total) by mouth daily.     gabapentin 100 MG capsule  Commonly known as:  NEURONTIN  Take 1 capsule (100 mg total) by mouth daily.     HUMALOG KWIKPEN 100 UNIT/ML KiwkPen  Generic drug:  insulin lispro  Inject 0-10 Units into the skin 3 (three) times daily. Sliding Scale     lacosamide 50 MG Tabs tablet  Commonly known as:  VIMPAT  150mg  BID on M-W-F (dialysis days); 100mg  BID on Tue-Thu-Sat-Sun (non-dialysis days)     lidocaine 5 %  Commonly known as:  LIDODERM  Place 1 patch onto the skin daily. Remove & Discard patch within 12 hours or as directed by MD     multivitamin Tabs tablet  Take 1 tablet by mouth at bedtime.     pantoprazole 40 MG tablet  Commonly known as:  PROTONIX  Take 1 tablet (40 mg total) by mouth 2 (two) times daily.     sodium chloride 0.9 % SOLN 100 mL with ferric gluconate 12.5 MG/ML SOLN 62.5 mg  Inject 62.5 mg into the vein every Thursday with hemodialysis.     traMADol 50 MG tablet  Commonly known as:  ULTRAM  Take one tablet by mouth every 6 hours as needed for pain. Patient needs appointment before anymore refills.        Review of Systems:  Review of Systems  Constitutional: Negative for fever and chills.  HENT: Positive for congestion.   Respiratory: Positive for cough. Negative for shortness of breath.     Cardiovascular: Negative for chest pain.  Gastrointestinal: Negative for abdominal pain, constipation and blood in stool.  Genitourinary: Negative for dysuria.  Skin: Negative for rash.       Left AVF antecubital area warmth and mild swelling.   Neurological: Positive for seizures and weakness. Negative for dizziness.  Hematological: Does not bruise/bleed easily.  Psychiatric/Behavioral: Positive for confusion. Negative for hallucinations, behavioral problems, decreased concentration and agitation.    Health  Maintenance  Topic Date Due  . OPHTHALMOLOGY EXAM  01/24/1944  . URINE MICROALBUMIN  01/24/1944  . TETANUS/TDAP  01/23/1953  . ZOSTAVAX  01/23/1994  . FOOT EXAM  02/13/2016  . INFLUENZA VACCINE  04/06/2016  . HEMOGLOBIN A1C  04/21/2016  . PNA vac Low Risk Adult (2 of 2 - PPSV23) 06/08/2016    Physical Exam: Filed Vitals:   11/20/15 1015  BP: 144/36  Temp: 97.4 F (36.3 C)  TempSrc: Oral  Weight: 166 lb 9.6 oz (75.569 kg)   Body mass index is 22.59 kg/(m^2). Physical Exam  Constitutional: He appears well-developed and well-nourished. No distress.  Eyes:  Eyes are swollen today  Cardiovascular: Normal rate, regular rhythm and normal heart sounds.   Pulmonary/Chest: Effort normal and breath sounds normal.  Abdominal: Soft. Bowel sounds are normal.  Musculoskeletal: Normal range of motion. He exhibits tenderness. He exhibits no edema.  Stooped posture and chronic low back pain  Neurological: He is alert.  Oriented to person, place, not time; retells stories form the distant past like they happened yesterday  Skin:  Left antecubital area AVF warm, mild swelling   Psychiatric: He has a normal mood and affect.    Labs reviewed: Basic Metabolic Panel:  Recent Labs  13/24/40 2105  06/20/15 2318 06/21/15 1420  07/26/15 1210  09/16/15 0430  10/24/15 0717 10/26/15 0555 10/27/15 1347  NA  --   < > 135  --   < > 134*  < > 136  < > 142 137 138  K  --   < > 4.4  --    < > 4.8  < > 6.0*  < > 4.2 3.9 3.9  CL  --   < > 96*  --   < > 98*  < > 98*  < > 103 97* 96*  CO2  --   < > 27  --   < > 24  < > 20*  < > 22 29 26   GLUCOSE  --   < > 147*  --   < > 149*  < > 135*  < > 198* 175* 197*  BUN  --   < > 38*  --   < > 75*  < > 140*  < > 44* 37* 62*  CREATININE  --   < > 5.89*  --   < > 8.37*  < > 13.98*  < > 7.47* 6.67* 9.19*  CALCIUM  --   < > 9.0  --   < > 9.0  < > 7.8*  < > 8.5* 8.5* 8.8*  MG 2.2  --  2.5*  --   --  3.3*  --   --   --   --   --   --   PHOS  --   < >  --   --   < >  --   < >  --   < > 5.6* 4.0 5.3*  TSH  --   --   --  1.607  --   --   --  1.621  --   --   --   --   < > = values in this interval not displayed. Liver Function Tests:  Recent Labs  09/15/15 1702 09/16/15 0430  10/23/15 1108 10/24/15 0717 10/26/15 0555 10/27/15 1347  AST 16 15  --  23  --   --   --   ALT 10* 9*  --  13*  --   --   --  ALKPHOS 49 44  --  46  --   --   --   BILITOT 0.6 0.5  --  0.8  --   --   --   PROT 6.4* 6.1*  --  7.4  --   --   --   ALBUMIN 3.5 3.4*  < > 4.1 3.1* 2.8* 3.0*  < > = values in this interval not displayed. No results for input(s): LIPASE, AMYLASE in the last 8760 hours.  Recent Labs  06/21/15 1420  AMMONIA 27   CBC:  Recent Labs  09/15/15 1702  09/16/15 0430  10/25/15 1704 10/26/15 0555 10/27/15 1346  WBC 5.7  --  5.7  < > 3.1* 3.1* 2.5*  NEUTROABS 3.8  --  3.3  --   --   --  1.3*  HGB 8.5*  < > 9.0*  < > 9.9* 9.7* 8.9*  HCT 26.1*  < > 26.7*  < > 31.0* 29.5* 27.4*  MCV 96.3  --  95.7  < > 100.0 100.3* 96.8  PLT 95*  --  99*  < > 41* 46* 49*  < > = values in this interval not displayed. Lipid Panel:  Recent Labs  05/13/15 0925 05/20/15 0313  CHOL 107 111  HDL 66 53  LDLCALC 31 45  TRIG 48 63  CHOLHDL 1.6 2.1   Lab Results  Component Value Date   HGBA1C 5.3 10/23/2015    Procedures since last visit: Dg Chest 2 View  10/23/2015  CLINICAL DATA:  Altered mental status.  ESRD EXAM: CHEST  2 VIEW COMPARISON:   09/15/2015 FINDINGS: Right jugular dialysis catheter in the right atrium unchanged. Heart size normal. Normal vascularity. No edema or effusion. Negative for pneumonia. IMPRESSION: No active cardiopulmonary disease. Electronically Signed   By: Marlan Palau M.D.   On: 10/23/2015 10:42   Ct Head Wo Contrast  10/23/2015  CLINICAL DATA:  Altered mental status.  ESRD. EXAM: CT HEAD WITHOUT CONTRAST TECHNIQUE: Contiguous axial images were obtained from the base of the skull through the vertex without intravenous contrast. COMPARISON:  09/15/2015 FINDINGS: Image quality degraded by motion multiple repeat images performed Moderate atrophy. Moderate to advanced chronic microvascular ischemic change throughout the white matter, similar to the prior CT. Negative for acute infarct. Negative for acute hemorrhage or mass lesion. Calvarium intact.  Mild mucosal edema paranasal sinuses. IMPRESSION: Atrophy and chronic microvascular ischemia unchanged from the prior study. No acute abnormality. Electronically Signed   By: Marlan Palau M.D.   On: 10/23/2015 10:30    Assessment/Plan ESRD on dialysis [facility]) end-stage renal disease Monday Wednesday Friday-still passes urine 3-4 times a day--has a history of intermittent noncompliance   Bradycardia Bradycardia, junctional-first-degree AV block without any conduction system anomaly noted by EP and not a candidate for pacemaker  Chronic diastolic congestive heart failure (HCC) Diastolic heart failure grade 1 based on TTE 01/14/13 EF 55-60% P ASP 71 History subdural hematoma  Subdural hematoma (HCC) 07/26/15 left-sided subdural secondary to recurrent falls-no midline shift  Bacteremia Blood culture--If these are +, suggest Echo r/o Endocarditis  Acute GI bleeding ? GI bleed, history colonoscopy 07/2006, 08/2007-EGD = esophagitis, colonoscopies showed diverticula and capsule 2008 negative for AVM suspicious bleeds--patient had 200 cc of coffee ground emesis and was  placed on Protonix gtt while on dialysis. subsequently tolerating diet. Will need follow-up with Dr. Elnoria Howard as an outpatient-cannot use non-steroidals. continue Protonix 40 bid   Seizure disorder [facility]) Prior subdural hemorrhages with seizures-may acccount for  altered mentation. continue Vimpat 100 twice a day as well as 50 mg Monday Wednesday Friday  Hypertension Hx of orthostatic hypotension, may consider reduce Amlodipine to 5mg  daily, recheck BP after sitting down.   Diabetes type 2, controlled (HCC) T2 DM  with diabetic neuropathy/nephropathy-continue gabapentin 100 daily, insulin per SSI  Anemia in chronic kidney disease Anemia of renal disease-getting IV iron with dialysis, continue Aranesp.      Labs/tests ordered:  @ORDERS @ blood culture x1 f/u Next appt:  12/29/2015

## 2015-11-20 NOTE — Assessment & Plan Note (Signed)
Diastolic heart failure grade 1 based on TTE 01/14/13 EF 55-60% P ASP 71 History subdural hematoma

## 2015-11-20 NOTE — Assessment & Plan Note (Signed)
end-stage renal disease Monday Wednesday Friday-still passes urine 3-4 times a day--has a history of intermittent noncompliance

## 2015-11-20 NOTE — Assessment & Plan Note (Signed)
07/26/15 left-sided subdural secondary to recurrent falls-no midline shift

## 2015-11-20 NOTE — Assessment & Plan Note (Signed)
Prior subdural hemorrhages with seizures-may acccount for altered mentation. continue Vimpat 100 twice a day as well as 50 mg Monday Wednesday Friday

## 2015-11-21 ENCOUNTER — Telehealth: Payer: Self-pay | Admitting: *Deleted

## 2015-11-21 DIAGNOSIS — Z794 Long term (current) use of insulin: Secondary | ICD-10-CM | POA: Diagnosis not present

## 2015-11-21 DIAGNOSIS — D509 Iron deficiency anemia, unspecified: Secondary | ICD-10-CM | POA: Diagnosis not present

## 2015-11-21 DIAGNOSIS — S065X0S Traumatic subdural hemorrhage without loss of consciousness, sequela: Secondary | ICD-10-CM | POA: Diagnosis not present

## 2015-11-21 DIAGNOSIS — N186 End stage renal disease: Secondary | ICD-10-CM | POA: Diagnosis not present

## 2015-11-21 DIAGNOSIS — Z9115 Patient's noncompliance with renal dialysis: Secondary | ICD-10-CM | POA: Diagnosis not present

## 2015-11-21 DIAGNOSIS — E1122 Type 2 diabetes mellitus with diabetic chronic kidney disease: Secondary | ICD-10-CM | POA: Diagnosis not present

## 2015-11-21 DIAGNOSIS — I272 Other secondary pulmonary hypertension: Secondary | ICD-10-CM | POA: Diagnosis not present

## 2015-11-21 DIAGNOSIS — A4152 Sepsis due to Pseudomonas: Secondary | ICD-10-CM | POA: Diagnosis not present

## 2015-11-21 DIAGNOSIS — Z992 Dependence on renal dialysis: Secondary | ICD-10-CM | POA: Diagnosis not present

## 2015-11-21 DIAGNOSIS — Z9181 History of falling: Secondary | ICD-10-CM | POA: Diagnosis not present

## 2015-11-21 DIAGNOSIS — D631 Anemia in chronic kidney disease: Secondary | ICD-10-CM | POA: Diagnosis not present

## 2015-11-21 DIAGNOSIS — M545 Low back pain: Secondary | ICD-10-CM | POA: Diagnosis not present

## 2015-11-21 DIAGNOSIS — M1990 Unspecified osteoarthritis, unspecified site: Secondary | ICD-10-CM | POA: Diagnosis not present

## 2015-11-21 DIAGNOSIS — Z79891 Long term (current) use of opiate analgesic: Secondary | ICD-10-CM | POA: Diagnosis not present

## 2015-11-21 DIAGNOSIS — I120 Hypertensive chronic kidney disease with stage 5 chronic kidney disease or end stage renal disease: Secondary | ICD-10-CM | POA: Diagnosis not present

## 2015-11-21 NOTE — Telephone Encounter (Signed)
Received fax from [facility] of patient's Blood Culture Results-Preliminary Report Result 1 shows NO Growth detected at this time. Awaiting the Final Result.

## 2015-11-24 DIAGNOSIS — D631 Anemia in chronic kidney disease: Secondary | ICD-10-CM | POA: Diagnosis not present

## 2015-11-24 DIAGNOSIS — A4152 Sepsis due to Pseudomonas: Secondary | ICD-10-CM | POA: Diagnosis not present

## 2015-11-24 DIAGNOSIS — D509 Iron deficiency anemia, unspecified: Secondary | ICD-10-CM | POA: Diagnosis not present

## 2015-11-24 DIAGNOSIS — N186 End stage renal disease: Secondary | ICD-10-CM | POA: Diagnosis not present

## 2015-11-25 DIAGNOSIS — Z515 Encounter for palliative care: Secondary | ICD-10-CM | POA: Insufficient documentation

## 2015-11-25 DIAGNOSIS — Z7189 Other specified counseling: Secondary | ICD-10-CM | POA: Insufficient documentation

## 2015-11-25 NOTE — Telephone Encounter (Signed)
Patient now has FU on 12/23/15.

## 2015-11-26 ENCOUNTER — Encounter (HOSPITAL_COMMUNITY): Payer: Self-pay | Admitting: Emergency Medicine

## 2015-11-26 ENCOUNTER — Emergency Department (HOSPITAL_COMMUNITY): Payer: Medicare Other

## 2015-11-26 ENCOUNTER — Observation Stay (HOSPITAL_COMMUNITY)
Admission: EM | Admit: 2015-11-26 | Discharge: 2015-11-28 | Disposition: A | Discharge status: Home / Self Care | Payer: Medicare Other | Attending: Internal Medicine | Admitting: Internal Medicine

## 2015-11-26 DIAGNOSIS — D509 Iron deficiency anemia, unspecified: Secondary | ICD-10-CM | POA: Diagnosis not present

## 2015-11-26 DIAGNOSIS — I10 Essential (primary) hypertension: Secondary | ICD-10-CM

## 2015-11-26 DIAGNOSIS — G4730 Sleep apnea, unspecified: Secondary | ICD-10-CM | POA: Diagnosis not present

## 2015-11-26 DIAGNOSIS — N189 Chronic kidney disease, unspecified: Secondary | ICD-10-CM | POA: Diagnosis not present

## 2015-11-26 DIAGNOSIS — I132 Hypertensive heart and chronic kidney disease with heart failure and with stage 5 chronic kidney disease, or end stage renal disease: Secondary | ICD-10-CM | POA: Diagnosis not present

## 2015-11-26 DIAGNOSIS — Z79899 Other long term (current) drug therapy: Secondary | ICD-10-CM | POA: Insufficient documentation

## 2015-11-26 DIAGNOSIS — I5032 Chronic diastolic (congestive) heart failure: Secondary | ICD-10-CM | POA: Insufficient documentation

## 2015-11-26 DIAGNOSIS — N2581 Secondary hyperparathyroidism of renal origin: Secondary | ICD-10-CM | POA: Diagnosis present

## 2015-11-26 DIAGNOSIS — Z992 Dependence on renal dialysis: Secondary | ICD-10-CM | POA: Diagnosis not present

## 2015-11-26 DIAGNOSIS — G40909 Epilepsy, unspecified, not intractable, without status epilepticus: Secondary | ICD-10-CM | POA: Insufficient documentation

## 2015-11-26 DIAGNOSIS — E1122 Type 2 diabetes mellitus with diabetic chronic kidney disease: Secondary | ICD-10-CM | POA: Diagnosis not present

## 2015-11-26 DIAGNOSIS — N186 End stage renal disease: Secondary | ICD-10-CM | POA: Insufficient documentation

## 2015-11-26 DIAGNOSIS — F0390 Unspecified dementia without behavioral disturbance: Secondary | ICD-10-CM | POA: Diagnosis present

## 2015-11-26 DIAGNOSIS — E118 Type 2 diabetes mellitus with unspecified complications: Secondary | ICD-10-CM

## 2015-11-26 DIAGNOSIS — D631 Anemia in chronic kidney disease: Secondary | ICD-10-CM | POA: Insufficient documentation

## 2015-11-26 DIAGNOSIS — T82590A Other mechanical complication of surgically created arteriovenous fistula, initial encounter: Secondary | ICD-10-CM | POA: Diagnosis not present

## 2015-11-26 DIAGNOSIS — R001 Bradycardia, unspecified: Secondary | ICD-10-CM | POA: Diagnosis not present

## 2015-11-26 DIAGNOSIS — Z95828 Presence of other vascular implants and grafts: Secondary | ICD-10-CM

## 2015-11-26 DIAGNOSIS — Z794 Long term (current) use of insulin: Secondary | ICD-10-CM | POA: Diagnosis not present

## 2015-11-26 DIAGNOSIS — Z419 Encounter for procedure for purposes other than remedying health state, unspecified: Secondary | ICD-10-CM

## 2015-11-26 DIAGNOSIS — I120 Hypertensive chronic kidney disease with stage 5 chronic kidney disease or end stage renal disease: Secondary | ICD-10-CM | POA: Diagnosis not present

## 2015-11-26 DIAGNOSIS — E875 Hyperkalemia: Principal | ICD-10-CM | POA: Insufficient documentation

## 2015-11-26 DIAGNOSIS — A4152 Sepsis due to Pseudomonas: Secondary | ICD-10-CM | POA: Diagnosis not present

## 2015-11-26 DIAGNOSIS — I499 Cardiac arrhythmia, unspecified: Secondary | ICD-10-CM | POA: Diagnosis not present

## 2015-11-26 DIAGNOSIS — R531 Weakness: Secondary | ICD-10-CM | POA: Diagnosis not present

## 2015-11-26 LAB — GLUCOSE, CAPILLARY: Glucose-Capillary: 193 mg/dL — ABNORMAL HIGH (ref 65–99)

## 2015-11-26 LAB — I-STAT CHEM 8, ED
BUN: 55 mg/dL — ABNORMAL HIGH (ref 6–20)
Calcium, Ion: 1.07 mmol/L — ABNORMAL LOW (ref 1.13–1.30)
Chloride: 102 mmol/L (ref 101–111)
Creatinine, Ser: 9.1 mg/dL — ABNORMAL HIGH (ref 0.61–1.24)
Glucose, Bld: 270 mg/dL — ABNORMAL HIGH (ref 65–99)
HEMATOCRIT: 32 % — AB (ref 39.0–52.0)
HEMOGLOBIN: 10.9 g/dL — AB (ref 13.0–17.0)
Potassium: 5.6 mmol/L — ABNORMAL HIGH (ref 3.5–5.1)
SODIUM: 136 mmol/L (ref 135–145)
TCO2: 23 mmol/L (ref 0–100)

## 2015-11-26 LAB — CBC WITH DIFFERENTIAL/PLATELET
BASOS ABS: 0 10*3/uL (ref 0.0–0.1)
BASOS PCT: 1 %
EOS ABS: 0.2 10*3/uL (ref 0.0–0.7)
Eosinophils Relative: 3 %
HEMATOCRIT: 31.5 % — AB (ref 39.0–52.0)
HEMOGLOBIN: 9.9 g/dL — AB (ref 13.0–17.0)
Lymphocytes Relative: 27 %
Lymphs Abs: 1.3 10*3/uL (ref 0.7–4.0)
MCH: 31.5 pg (ref 26.0–34.0)
MCHC: 31.4 g/dL (ref 30.0–36.0)
MCV: 100.3 fL — ABNORMAL HIGH (ref 78.0–100.0)
MONO ABS: 0.3 10*3/uL (ref 0.1–1.0)
Monocytes Relative: 7 %
NEUTROS ABS: 2.9 10*3/uL (ref 1.7–7.7)
NEUTROS PCT: 62 %
Platelets: 92 10*3/uL — ABNORMAL LOW (ref 150–400)
RBC: 3.14 MIL/uL — ABNORMAL LOW (ref 4.22–5.81)
RDW: 16.5 % — AB (ref 11.5–15.5)
WBC: 4.6 10*3/uL (ref 4.0–10.5)

## 2015-11-26 LAB — TSH: TSH: 1.427 u[IU]/mL (ref 0.350–4.500)

## 2015-11-26 LAB — TROPONIN I: TROPONIN I: 0.06 ng/mL — AB (ref <0.031)

## 2015-11-26 LAB — HEPATITIS B SURFACE ANTIGEN: Hepatitis B Surface Ag: NEGATIVE

## 2015-11-26 MED ORDER — INSULIN ASPART 100 UNIT/ML ~~LOC~~ SOLN
0.0000 [IU] | Freq: Every day | SUBCUTANEOUS | Status: DC
  Administered 2015-11-27: 3 [IU] via SUBCUTANEOUS

## 2015-11-26 MED ORDER — NA FERRIC GLUC CPLX IN SUCROSE 12.5 MG/ML IV SOLN
62.5000 mg | Freq: Once | INTRAVENOUS | Status: AC
  Administered 2015-11-26: 62.5 mg via INTRAVENOUS
  Filled 2015-11-26 (×2): qty 5

## 2015-11-26 MED ORDER — LIDOCAINE 5 % EX PTCH: 1.0000 | MEDICATED_PATCH | Freq: Every day | CUTANEOUS | Status: DC | PRN

## 2015-11-26 MED ORDER — CINACALCET HCL 30 MG PO TABS
60.0000 mg | ORAL_TABLET | Freq: Every day | ORAL | Status: DC
Start: 2015-11-27 — End: 2015-11-28
  Administered 2015-11-27 – 2015-11-28 (×2): 60 mg via ORAL
  Filled 2015-11-26 (×3): qty 2

## 2015-11-26 MED ORDER — COLCHICINE 0.6 MG PO TABS
0.6000 mg | ORAL_TABLET | Freq: Every day | ORAL | Status: DC
  Administered 2015-11-27: 0.6 mg via ORAL
  Filled 2015-11-26: qty 1

## 2015-11-26 MED ORDER — FOLIC ACID 1 MG PO TABS
1.0000 mg | ORAL_TABLET | Freq: Every day | ORAL | Status: DC
  Administered 2015-11-27 – 2015-11-28 (×2): 1 mg via ORAL
  Filled 2015-11-26 (×2): qty 1

## 2015-11-26 MED ORDER — ACETAMINOPHEN 325 MG PO TABS: 650.0000 mg | ORAL_TABLET | Freq: Four times a day (QID) | ORAL | Status: DC | PRN

## 2015-11-26 MED ORDER — INSULIN ASPART 100 UNIT/ML ~~LOC~~ SOLN
0.0000 [IU] | Freq: Three times a day (TID) | SUBCUTANEOUS | Status: DC
  Administered 2015-11-27: 5 [IU] via SUBCUTANEOUS
  Administered 2015-11-28: 2 [IU] via SUBCUTANEOUS

## 2015-11-26 MED ORDER — ZOLPIDEM TARTRATE 5 MG PO TABS: 5.0000 mg | ORAL_TABLET | Freq: Every evening | ORAL | Status: DC | PRN

## 2015-11-26 MED ORDER — CALCITRIOL 0.5 MCG PO CAPS: 0.7500 ug | ORAL_CAPSULE | ORAL | Status: DC

## 2015-11-26 MED ORDER — DIPHENHYDRAMINE-ACETAMINOPHEN 25-500 MG PO TABS: 1.0000 | ORAL_TABLET | Freq: Every evening | ORAL | Status: DC | PRN

## 2015-11-26 MED ORDER — DOCUSATE SODIUM 100 MG PO CAPS: 100.0000 mg | ORAL_CAPSULE | Freq: Every day | ORAL | Status: DC | PRN

## 2015-11-26 MED ORDER — TRAMADOL HCL 50 MG PO TABS: 50.0000 mg | ORAL_TABLET | Freq: Four times a day (QID) | ORAL | Status: DC | PRN

## 2015-11-26 MED ORDER — CALCIUM ACETATE 667 MG PO CAPS: 2001.0000 mg | ORAL_CAPSULE | Freq: Three times a day (TID) | ORAL | Status: DC

## 2015-11-26 MED ORDER — PANTOPRAZOLE SODIUM 40 MG PO TBEC
40.0000 mg | DELAYED_RELEASE_TABLET | Freq: Two times a day (BID) | ORAL | Status: DC
  Administered 2015-11-26 – 2015-11-28 (×4): 40 mg via ORAL
  Filled 2015-11-26 (×4): qty 1

## 2015-11-26 MED ORDER — CINACALCET HCL 30 MG PO TABS: 30.0000 mg | ORAL_TABLET | Freq: Every day | ORAL | Status: DC

## 2015-11-26 MED ORDER — ACETAMINOPHEN 650 MG RE SUPP: 650.0000 mg | Freq: Four times a day (QID) | RECTAL | Status: DC | PRN

## 2015-11-26 MED ORDER — AMLODIPINE BESYLATE 5 MG PO TABS: 5.0000 mg | ORAL_TABLET | Freq: Every day | ORAL | Status: DC

## 2015-11-26 MED ORDER — AMLODIPINE BESYLATE 10 MG PO TABS: 10.0000 mg | ORAL_TABLET | Freq: Every day | ORAL | Status: DC

## 2015-11-26 MED ORDER — CALCITRIOL 0.5 MCG PO CAPS: 1.0000 ug | ORAL_CAPSULE | ORAL | Status: DC

## 2015-11-26 MED ORDER — NEPRO/CARBSTEADY PO LIQD
237.0000 mL | Freq: Two times a day (BID) | ORAL | Status: DC
Start: 2015-11-27 — End: 2015-11-28
  Administered 2015-11-27: 237 mL via ORAL
  Filled 2015-11-26 (×6): qty 237

## 2015-11-26 MED ORDER — ENSURE ENLIVE PO LIQD: 237.0000 mL | Freq: Two times a day (BID) | ORAL | Status: DC

## 2015-11-26 MED ORDER — DARBEPOETIN ALFA 150 MCG/0.3ML IJ SOSY: 150.0000 ug | PREFILLED_SYRINGE | INTRAMUSCULAR | Status: DC

## 2015-11-26 NOTE — ED Notes (Signed)
Pt to ER via [facility] from [facility] where patient was having his fistula looked at due to it being difficult to access. Pt is dialysis patient and receives dialysis MWF, however was unable to be accessed. Pt sent to ER at this time due to HR of 36. Pt is alert and oriented x4, BP 141/46, HR 35, 99% on RA.

## 2015-11-26 NOTE — Procedures (Signed)
  I was present at this dialysis session, have reviewed the session itself and made  appropriate changes [name] [facility] pager [phone]    cell [phone] 11/26/2015, 4:30 PM

## 2015-11-26 NOTE — ED Provider Notes (Signed)
CSN: [id number]     Arrival date & time 11/26/15  1257 History   First MD Initiated Contact with Patient 11/26/15 1303     Chief Complaint  Patient presents with  . Bradycardia     The history is provided by the patient.  r patient was sent in for symptomatically bradycardia. He was at dialysis today for his typical Wednesday dialysis when they were unable to access his graft. He was sent to [facility] with air attending to do it declot. They found areas where it can be accessed did not appear to need declotting at this time. He was more found to be bradycardic. Heart rate in the 30s. Reportedly could not have the patient sit up due to lightheadedness. Patient has had history of bradycardia. Patient states he used to run marathons. Reviewing records his been seen by electrophysiology in the past. At that time he was not a candidate for a pacemaker. Patient states he feels fine otherwise. There is however some mild dementia. He states his normal heart rate is 126.    Past Medical History  Diagnosis Date  . Hypertension   . Diabetes mellitus without complication (HCC)   . Arthritis   . History of blood transfusion   . GI bleed   . ESRD (end stage renal disease) (HCC)   . Seizure (HCC)   . Dialysis patient (HCC)   . Subdural hematoma (HCC) 05/2015    from fall  . Sleep apnea     wears CPAP n/c  . Pulmonary hypertension (HCC)     severe pulmonary HTN by 01/14/13 echo   Past Surgical History  Procedure Laterality Date  . Esophagogastroduodenoscopy  10/03/2012    Procedure: ESOPHAGOGASTRODUODENOSCOPY (EGD);  Surgeon: Theda Belfast, MD;  Location: [facility] ENDOSCOPY;  Service: Endoscopy;  Laterality: N/A;  . Colonoscopy  10/04/2012    Procedure: COLONOSCOPY;  Surgeon: Theda Belfast, MD;  Location: [facility] ENDOSCOPY;  Service: Endoscopy;  Laterality: N/A;  . Colonoscopy with esophagogastroduodenoscopy (egd) Left 11/30/2012    Procedure: COLONOSCOPY WITH ESOPHAGOGASTRODUODENOSCOPY (EGD);  Surgeon:  Theda Belfast, MD;  Location: [facility] ENDOSCOPY;  Service: Endoscopy;  Laterality: Left;  . Spine surgery  2004    back fusion/ [facility] Penumalli,MD  . Retinal laser procedure Right 04/2015  . Av fistula placement Left 09/09/2015    Procedure: INSERTION OF ARTERIOVENOUS  GORE-TEX GRAFT Left  ARM;  Surgeon: Sherren Kerns, MD;  Location: [facility] OR;  Service: Vascular;  Laterality: Left;  . Insertion of dialysis catheter Right 11/27/2015    Procedure: INSERTION OF DIALYSIS CATHETER - Right Internal Jugular Placement;  Surgeon: Chuck Hint, MD;  Location: [facility] OR;  Service: Vascular;  Laterality: Right;   Family History  Problem Relation Age of Onset  . Sudden death Mother   . Sudden death Father    Social History  Substance Use Topics  . Smoking status: Never Smoker   . Smokeless tobacco: Never Used  . Alcohol Use: No    Review of Systems  Constitutional: Negative for appetite change.  Respiratory: Negative for shortness of breath.   Cardiovascular: Negative for leg swelling.  Gastrointestinal: Negative for abdominal pain.  Musculoskeletal: Negative for back pain.  Skin: Negative for wound.  Neurological: Negative for light-headedness.      Allergies  Aspirin and Lactose intolerance (gi)  Home Medications   Prior to Admission medications   Medication Sig Start Date End Date Taking? Authorizing Provider  amLODipine (NORVASC) 10 MG tablet Take 5  mg by mouth Daily.  08/15/12  Yes Historical Provider, MD  calcitRIOL (ROCALTROL) 0.5 MCG capsule Take 2 capsules (1 mcg total) by mouth every Monday, Wednesday, and Friday with hemodialysis. 05/23/15  Yes Lonia Blood, MD  calcium acetate (PHOSLO) 667 MG capsule Take 3 capsules by mouth three times a day Patient taking differently: Take 2,001 mg by mouth. Take 3 capsules by mouth three times a day 07/15/15  Yes Sharon Seller, NP  cefTAZidime 2 g in dextrose 5 % 50 mL Inject 2 g into the vein every Monday, Wednesday, and Friday with  hemodialysis. 10/27/15  Yes Rhetta Mura, MD  cinacalcet (SENSIPAR) 30 MG tablet Take 1 tablet (30 mg total) by mouth daily with supper. 09/18/15  Yes Vassie Loll, MD  colchicine 0.6 MG tablet Take 1 tablet (0.6 mg total) by mouth as needed. For gout. Patient needs an appointment before anymore refills. 09/29/15  Yes [name] L [name], DO  darbepoetin (ARANESP) 60 MCG/0.3ML SOLN injection Inject 0.3 mLs (60 mcg total) into the vein every Thursday with hemodialysis. Patient taking differently: Inject 60 mcg into the vein every 7 (seven) days.  09/29/13  Yes Jessica U Vann, DO  Diphenhydramine-APAP 25-500 MG TABS Take 1 tablet by mouth at bedtime as needed (sleep). For sleep   Yes Historical Provider, MD  docusate sodium (COLACE) 100 MG capsule Take 1 capsule (100 mg total) by mouth 2 (two) times daily. Patient taking differently: Take 100 mg by mouth daily as needed for mild constipation.  09/18/15  Yes Vassie Loll, MD  folic acid (FOLVITE) 1 MG tablet Take 1 tablet (1 mg total) by mouth daily. 11/07/15  Yes [name] L [name], DO  HUMALOG KWIKPEN 100 UNIT/ML KiwkPen Inject 0-10 Units into the skin as needed (prn based on blood sugar reading). Sliding Scale 04/16/15  Yes Historical Provider, MD  lacosamide (VIMPAT) 50 MG TABS tablet 150mg  BID on M-W-F (dialysis days); 100mg  BID on Tue-Thu-Sat-Sun (non-dialysis days) 11/13/15  Yes Vikram R Penumalli, MD  lidocaine (LIDODERM) 5 % Place 1 patch onto the skin daily. Remove & Discard patch within 12 hours or as directed by MD Patient taking differently: Place 1 patch onto the skin daily as needed (pain). Remove & Discard patch within 12 hours or as directed by MD 09/18/15  Yes 01/13/16, MD  multivitamin (RENA-VIT) TABS tablet Take 1 tablet by mouth at bedtime. 09/29/13  Yes Vassie Loll, DO  Nutritional Supplements (FEEDING SUPPLEMENT, NEPRO CARB STEADY,) LIQD Take 237 mLs by mouth 2 (two) times daily between meals. 09/18/15  Yes Joseph Art, MD   pantoprazole (PROTONIX) 40 MG tablet Take 1 tablet (40 mg total) by mouth 2 (two) times daily. 10/27/15  Yes Vassie Loll, MD  sodium chloride 0.9 % SOLN 100 mL with ferric gluconate 12.5 MG/ML SOLN 62.5 mg Inject 62.5 mg into the vein every Thursday with hemodialysis. 09/29/13  Yes Tuesday, DO  traMADol (ULTRAM) 50 MG tablet Take one tablet by mouth every 6 hours as needed for pain. Patient needs appointment before anymore refills. 09/29/15  Yes [name] L [name], DO  gabapentin (NEURONTIN) 100 MG capsule Take 1 capsule (100 mg total) by mouth daily. Patient not taking: Reported on 11/26/2015 05/23/15   11/28/2015, MD   BP 142/66 mmHg  Pulse 77  Temp(Src) 97.9 F (36.6 C) (Oral)  Resp 13  Ht 6' (1.829 m)  Wt 200 lb 13.4 oz (91.1 kg)  BMI 27.23 kg/m2  SpO2 99% Physical  Exam  Constitutional: He appears well-developed.  HENT:  Head: Atraumatic.  Cardiovascular:  Bradycardia  Pulmonary/Chest: Effort normal.  Abdominal: Soft.  Musculoskeletal: Normal range of motion.  Dialysis graft to left upper arm. It is dressed with 2 apparent sutures in place. There is also linear ink lines.  Neurological: He is alert.  Skin: Skin is warm.    ED Course  Procedures (including critical care time) Labs Review Labs Reviewed  CBC WITH DIFFERENTIAL/PLATELET - Abnormal; Notable for the following:    RBC 3.14 (*)    Hemoglobin 9.9 (*)    HCT 31.5 (*)    MCV 100.3 (*)    RDW 16.5 (*)    Platelets 92 (*)    All other components within normal limits  TROPONIN I - Abnormal; Notable for the following:    Troponin I 0.06 (*)    All other components within normal limits  GLUCOSE, CAPILLARY - Abnormal; Notable for the following:    Glucose-Capillary 193 (*)    All other components within normal limits  BASIC METABOLIC PANEL - Abnormal; Notable for the following:    Chloride 99 (*)    Glucose, Bld 245 (*)    BUN 31 (*)    Creatinine, Ser 6.45 (*)    Calcium 8.4 (*)    GFR calc non  Af Amer 7 (*)    GFR calc Af Amer 8 (*)    All other components within normal limits  CBC - Abnormal; Notable for the following:    WBC 3.2 (*)    RBC 3.09 (*)    Hemoglobin 9.8 (*)    HCT 30.8 (*)    RDW 15.9 (*)    Platelets 93 (*)    All other components within normal limits  GLUCOSE, CAPILLARY - Abnormal; Notable for the following:    Glucose-Capillary 220 (*)    All other components within normal limits  GLUCOSE, CAPILLARY - Abnormal; Notable for the following:    Glucose-Capillary 52 (*)    All other components within normal limits  GLUCOSE, CAPILLARY - Abnormal; Notable for the following:    Glucose-Capillary 60 (*)    All other components within normal limits  BASIC METABOLIC PANEL - Abnormal; Notable for the following:    Chloride 100 (*)    Glucose, Bld 159 (*)    BUN 40 (*)    Creatinine, Ser 7.80 (*)    Calcium 8.3 (*)    GFR calc non Af Amer 6 (*)    GFR calc Af Amer 7 (*)    All other components within normal limits  CBC - Abnormal; Notable for the following:    WBC 3.7 (*)    RBC 3.02 (*)    Hemoglobin 9.5 (*)    HCT 29.4 (*)    RDW 15.6 (*)    Platelets 93 (*)    All other components within normal limits  GLUCOSE, CAPILLARY - Abnormal; Notable for the following:    Glucose-Capillary 267 (*)    All other components within normal limits  GLUCOSE, CAPILLARY - Abnormal; Notable for the following:    Glucose-Capillary 138 (*)    All other components within normal limits  RENAL FUNCTION PANEL - Abnormal; Notable for the following:    Sodium 133 (*)    Chloride 97 (*)    Glucose, Bld 248 (*)    BUN 44 (*)    Creatinine, Ser 8.12 (*)    Calcium 8.4 (*)    Albumin 3.2 (*)  GFR calc non Af Amer 5 (*)    GFR calc Af Amer 6 (*)    All other components within normal limits  I-STAT CHEM 8, ED - Abnormal; Notable for the following:    Potassium 5.6 (*)    BUN 55 (*)    Creatinine, Ser 9.10 (*)    Glucose, Bld 270 (*)    Calcium, Ion 1.07 (*)     Hemoglobin 10.9 (*)    HCT 32.0 (*)    All other components within normal limits  TSH  HEPATITIS B SURFACE ANTIGEN  GLUCOSE, CAPILLARY  GLUCOSE, CAPILLARY    Imaging Review Dg Chest Port 1 View  11/27/2015  CLINICAL DATA:  Status post dialysis catheter placement EXAM: PORTABLE CHEST 1 VIEW COMPARISON:  11/26/2015 FINDINGS: Cardiac shadow is stable. The lungs are clear. No infiltrate or pneumothorax is seen. A dialysis catheter is noted in satisfactory position. IMPRESSION: No acute abnormality.  No pneumothorax following catheter placement. Electronically Signed   By: Alcide Clever M.D.   On: 11/27/2015 15:59   Dg Fluoro Guide Cv Line-no Report  11/27/2015  CLINICAL DATA:  FLOURO GUIDE CV LINE Fluoroscopy was utilized by the requesting physician.  No radiographic interpretation.   I have personally reviewed and evaluated these images and lab results as part of my medical decision-making.   EKG Interpretation   Date/Time:  Wednesday November 26 2015 13:06:02 EDT Ventricular Rate:  36 PR Interval:    QRS Duration: 96 QT Interval:  641 QTC Calculation: 496 R Axis:   50 Text Interpretation:  Junctional rhythm Borderline prolonged QT interval  now junctional compared to previous sinus Confirmed by Rubin Payor  MD,  Harrold Donath [phone]) on 11/26/2015 1:33:24 PM      MDM   Final diagnoses:  Junctional bradycardia  Hyperkalemia  End stage renal failure on dialysis [facility])    Patient with bradycardia. Potassium mildly elevated. Has had similar bradycardia in the past when his potassium was at 5.6. Reportedly was orthostatic when his at nephrology. States he feels fine here however. Will admit for dialysis and possible cardiac evaluation. He has had previous cardiac evaluation for the bradycardia and at that time was not given a pacemaker but it looked as if it would be reevaluated later.    Benjiman Core, MD 11/28/15 [phone]

## 2015-11-26 NOTE — Progress Notes (Signed)
Patient agitated and irritable upon arrival to room. Does not want to be in the hospital, and also  because wife was not here. Oriented to room, call light within reach.

## 2015-11-26 NOTE — Consult Note (Signed)
[name] Renal Consultation Note    Indication for Consultation:  Management of ESRD/hemodialysis; anemia, hypertension/volume and secondary hyperparathyroidism PCP:  HPI: [name] is a 82 y.o. male with ESRD on hemodialysis at [facility]. PMH significant for HTN, DMT2, GI bleed, subdural hemotoma, pulmonary HTN, OSA, arthritis.  He presented to HD center today for scheduled treatment. When staff attempted to cannulate LUA AV graft, they encountered clots and difficulty cannulating. Subsequently, he was send to [facility] to Dr. Juel Burrow for possible intervention. AVG was found to be patent but patient's HR was in 30s. He was sent to ED for evaluation. Upon arrival, his K+ 5.6 12 lead EKG shows JR with prolonged Qt interval. He has been seen by electrophysiology, Dr.Camnitz, in past (06/2015) for similar problem which appeared to be related to elevated K+ and resolved with hemodialysis. He declined permanent pacemaker and opted to watch and wait to see if this occurred again.   Currently, he is in junctional rhythm with PJCs on monitor, HR 36-39. He is asymptomatic. "I feel fine", no complaints except about HD center. Denies fever, chills, nausea, vomiting, diarrhea, chest pain, abdominal pain, headache, vision changes, hearing changes, syncope, vertigo. He has been started on hemodialysis now to see if his HR improves with HD. No difficulty cannulating LUA AVG.   Past Medical History  Diagnosis Date  . Hypertension   . Diabetes mellitus without complication (HCC)   . Arthritis   . History of blood transfusion   . GI bleed   . ESRD (end stage renal disease) (HCC)   . Seizure (HCC)   . Dialysis patient (HCC)   . Subdural hematoma (HCC) 05/2015    from fall  . Sleep apnea     wears CPAP n/c  . Pulmonary hypertension (HCC)     severe pulmonary HTN by 01/14/13 echo   Past Surgical History  Procedure Laterality Date  .  Esophagogastroduodenoscopy  10/03/2012    Procedure: ESOPHAGOGASTRODUODENOSCOPY (EGD);  Surgeon: Theda Belfast, MD;  Location: [facility] ENDOSCOPY;  Service: Endoscopy;  Laterality: N/A;  . Colonoscopy  10/04/2012    Procedure: COLONOSCOPY;  Surgeon: Theda Belfast, MD;  Location: [facility] ENDOSCOPY;  Service: Endoscopy;  Laterality: N/A;  . Colonoscopy with esophagogastroduodenoscopy (egd) Left 11/30/2012    Procedure: COLONOSCOPY WITH ESOPHAGOGASTRODUODENOSCOPY (EGD);  Surgeon: Theda Belfast, MD;  Location: [facility] ENDOSCOPY;  Service: Endoscopy;  Laterality: Left;  . Spine surgery  2004    back fusion/ [facility] Penumalli,MD  . Retinal laser procedure Right 04/2015  . Av fistula placement Left 09/09/2015    Procedure: INSERTION OF ARTERIOVENOUS  GORE-TEX GRAFT Left  ARM;  Surgeon: Sherren Kerns, MD;  Location: [facility] OR;  Service: Vascular;  Laterality: Left;   Family History  Problem Relation Age of Onset  . Sudden death Mother   . Sudden death Father    Social History:  reports that he has never smoked. He has never used smokeless tobacco. He reports that he does not drink alcohol or use illicit drugs. Allergies  Allergen Reactions  . Aspirin Other (See Comments)    Bleeding   . Lactose Intolerance (Gi) Diarrhea   Prior to Admission medications   Medication Sig Start Date End Date Taking? Authorizing Provider  amLODipine (NORVASC) 10 MG tablet Take 5 mg by mouth Daily.  08/15/12  Yes Historical Provider, MD  calcitRIOL (ROCALTROL) 0.5 MCG capsule Take 2 capsules (1 mcg total) by mouth every Monday, Wednesday, and Friday with  hemodialysis. 05/23/15  Yes Lonia Blood, MD  calcium acetate (PHOSLO) 667 MG capsule Take 3 capsules by mouth three times a day Patient taking differently: Take 2,001 mg by mouth. Take 3 capsules by mouth three times a day 07/15/15  Yes Sharon Seller, NP  cefTAZidime 2 g in dextrose 5 % 50 mL Inject 2 g into the vein every Monday, Wednesday, and Friday with hemodialysis. 10/27/15   Yes Rhetta Mura, MD  cinacalcet (SENSIPAR) 30 MG tablet Take 1 tablet (30 mg total) by mouth daily with supper. 09/18/15  Yes Vassie Loll, MD  colchicine 0.6 MG tablet Take 1 tablet (0.6 mg total) by mouth as needed. For gout. Patient needs an appointment before anymore refills. 09/29/15  Yes [name] L [name], DO  darbepoetin (ARANESP) 60 MCG/0.3ML SOLN injection Inject 0.3 mLs (60 mcg total) into the vein every Thursday with hemodialysis. Patient taking differently: Inject 60 mcg into the vein every 7 (seven) days.  09/29/13  Yes Jessica U Vann, DO  Diphenhydramine-APAP 25-500 MG TABS Take 1 tablet by mouth at bedtime as needed (sleep). For sleep   Yes Historical Provider, MD  docusate sodium (COLACE) 100 MG capsule Take 1 capsule (100 mg total) by mouth 2 (two) times daily. Patient taking differently: Take 100 mg by mouth daily as needed for mild constipation.  09/18/15  Yes Vassie Loll, MD  folic acid (FOLVITE) 1 MG tablet Take 1 tablet (1 mg total) by mouth daily. 11/07/15  Yes [name] L [name], DO  HUMALOG KWIKPEN 100 UNIT/ML KiwkPen Inject 0-10 Units into the skin as needed (prn based on blood sugar reading). Sliding Scale 04/16/15  Yes Historical Provider, MD  lacosamide (VIMPAT) 50 MG TABS tablet 150mg  BID on M-W-F (dialysis days); 100mg  BID on Tue-Thu-Sat-Sun (non-dialysis days) 11/13/15  Yes Vikram R Penumalli, MD  lidocaine (LIDODERM) 5 % Place 1 patch onto the skin daily. Remove & Discard patch within 12 hours or as directed by MD Patient taking differently: Place 1 patch onto the skin daily as needed (pain). Remove & Discard patch within 12 hours or as directed by MD 09/18/15  Yes Vassie Loll, MD  multivitamin (RENA-VIT) TABS tablet Take 1 tablet by mouth at bedtime. 09/29/13  Yes Joseph Art, DO  Nutritional Supplements (FEEDING SUPPLEMENT, NEPRO CARB STEADY,) LIQD Take 237 mLs by mouth 2 (two) times daily between meals. 09/18/15  Yes Vassie Loll, MD  pantoprazole (PROTONIX) 40 MG  tablet Take 1 tablet (40 mg total) by mouth 2 (two) times daily. 10/27/15  Yes Rhetta Mura, MD  sodium chloride 0.9 % SOLN 100 mL with ferric gluconate 12.5 MG/ML SOLN 62.5 mg Inject 62.5 mg into the vein every Thursday with hemodialysis. 09/29/13  Yes Joseph Art, DO  traMADol (ULTRAM) 50 MG tablet Take one tablet by mouth every 6 hours as needed for pain. Patient needs appointment before anymore refills. 09/29/15  Yes [name] L [name], DO  gabapentin (NEURONTIN) 100 MG capsule Take 1 capsule (100 mg total) by mouth daily. Patient not taking: Reported on 11/26/2015 05/23/15   Lonia Blood, MD   Current Facility-Administered Medications  Medication Dose Route Frequency Provider Last Rate Last Dose  . acetaminophen (TYLENOL) tablet 650 mg  650 mg Oral Q6H PRN Gwenyth Bender, NP       Or  . acetaminophen (TYLENOL) suppository 650 mg  650 mg Rectal Q6H PRN Gwenyth Bender, NP      . Melene Muller ON 11/28/2015] calcitRIOL (ROCALTROL) capsule 0.75 mcg  0.75 mcg Oral Q M,W,F-HD Pola Corn, NP      . Melene Muller ON 11/28/2015] calcitRIOL (ROCALTROL) capsule 1 mcg  1 mcg Oral Q M,W,F-HD Gwenyth Bender, NP      . calcium acetate (PHOSLO) capsule 2,001 mg  2,001 mg Oral TID WC Lesle Chris Black, NP      . cinacalcet [facility]) tablet 30 mg  30 mg Oral Q supper Gwenyth Bender, NP      . colchicine tablet 0.6 mg  0.6 mg Oral Daily Gwenyth Bender, NP      . Darbepoetin Alfa (ARANESP) injection 150 mcg  150 mcg Subcutaneous Q Wed-1800 Pola Corn, NP      . Diphenhydramine-APAP 25-500 MG TABS 1 tablet  1 tablet Oral QHS PRN Gwenyth Bender, NP      . docusate sodium (COLACE) capsule 100 mg  100 mg Oral Daily PRN Gwenyth Bender, NP      . feeding supplement (NEPRO CARB STEADY) liquid 237 mL  237 mL Oral BID BM Lesle Chris Black, NP      . ferric gluconate (NULECIT) 62.5 mg in sodium chloride 0.9 % 100 mL IVPB  62.5 mg Intravenous Once Pola Corn, NP      . folic acid (FOLVITE) tablet 1 mg  1 mg Oral Daily  Lesle Chris Black, NP      . insulin aspart (novoLOG) injection 0-15 Units  0-15 Units Subcutaneous TID WC Lesle Chris Black, NP      . insulin aspart (novoLOG) injection 0-5 Units  0-5 Units Subcutaneous QHS Lesle Chris Black, NP      . lidocaine (LIDODERM) 5 % 1 patch  1 patch Transdermal Daily PRN Gwenyth Bender, NP      . pantoprazole (PROTONIX) EC tablet 40 mg  40 mg Oral BID Lesle Chris Black, NP      . traMADol Janean Sark) tablet 50 mg  50 mg Oral Q6H PRN Gwenyth Bender, NP       Current Outpatient Prescriptions  Medication Sig Dispense Refill  . amLODipine (NORVASC) 10 MG tablet Take 5 mg by mouth Daily.     . calcitRIOL (ROCALTROL) 0.5 MCG capsule Take 2 capsules (1 mcg total) by mouth every Monday, Wednesday, and Friday with hemodialysis.    Marland Kitchen calcium acetate (PHOSLO) 667 MG capsule Take 3 capsules by mouth three times a day (Patient taking differently: Take 2,001 mg by mouth. Take 3 capsules by mouth three times a day) 180 capsule 3  . cefTAZidime 2 g in dextrose 5 % 50 mL Inject 2 g into the vein every Monday, Wednesday, and Friday with hemodialysis.    Marland Kitchen cinacalcet (SENSIPAR) 30 MG tablet Take 1 tablet (30 mg total) by mouth daily with supper.    . colchicine 0.6 MG tablet Take 1 tablet (0.6 mg total) by mouth as needed. For gout. Patient needs an appointment before anymore refills. 30 tablet 0  . darbepoetin (ARANESP) 60 MCG/0.3ML SOLN injection Inject 0.3 mLs (60 mcg total) into the vein every Thursday with hemodialysis. (Patient taking differently: Inject 60 mcg into the vein every 7 (seven) days. ) 4.2 mL   . Diphenhydramine-APAP 25-500 MG TABS Take 1 tablet by mouth at bedtime as needed (sleep). For sleep    . docusate sodium (COLACE) 100 MG capsule Take 1 capsule (100 mg total) by mouth 2 (two) times daily. (Patient taking differently: Take 100 mg by mouth daily as needed for mild constipation. )  60 capsule 0  . folic acid (FOLVITE) 1 MG tablet Take 1 tablet (1 mg total) by mouth daily. 30 tablet 0   . HUMALOG KWIKPEN 100 UNIT/ML KiwkPen Inject 0-10 Units into the skin as needed (prn based on blood sugar reading). Sliding Scale  0  . lacosamide (VIMPAT) 50 MG TABS tablet 150mg  BID on M-W-F (dialysis days); 100mg  BID on Tue-Thu-Sat-Sun (non-dialysis days) 180 tablet 5  . lidocaine (LIDODERM) 5 % Place 1 patch onto the skin daily. Remove & Discard patch within 12 hours or as directed by MD (Patient taking differently: Place 1 patch onto the skin daily as needed (pain). Remove & Discard patch within 12 hours or as directed by MD) 30 patch 0  . multivitamin (RENA-VIT) TABS tablet Take 1 tablet by mouth at bedtime. 30 tablet 0  . Nutritional Supplements (FEEDING SUPPLEMENT, NEPRO CARB STEADY,) LIQD Take 237 mLs by mouth 2 (two) times daily between meals.    . pantoprazole (PROTONIX) 40 MG tablet Take 1 tablet (40 mg total) by mouth 2 (two) times daily. 60 tablet 0  . sodium chloride 0.9 % SOLN 100 mL with ferric gluconate 12.5 MG/ML SOLN 62.5 mg Inject 62.5 mg into the vein every Thursday with hemodialysis.    traMADol (ULTRAM) 50 MG tablet Take one tablet by mouth every 6 hours as needed for pain. Patient needs appointment before anymore refills. 120 tablet 0  . gabapentin (NEURONTIN) 100 MG capsule Take 1 capsule (100 mg total) by mouth daily. (Patient not taking: Reported on 11/26/2015) 30 capsule 0   Facility-Administered Medications Ordered in Other Encounters  Medication Dose Route Frequency Provider Last Rate Last Dose  . Chlorhexidine Gluconate Cloth 2 % PADS 6 each  6 each Topical Once Marland Kitchen, MD       Labs: Basic Metabolic Panel:  Recent Labs Lab 11/26/15 1421  NA 136  K 5.6*  CL 102  GLUCOSE 270*  BUN 55*  CREATININE 9.10*   Liver Function Tests: No results for input(s): AST, ALT, ALKPHOS, BILITOT, PROT, ALBUMIN in the last 168 hours. No results for input(s): LIPASE, AMYLASE in the last 168 hours. No results for input(s): AMMONIA in the last 168  hours. CBC:  Recent Labs Lab 11/26/15 1412 11/26/15 1421  WBC 4.6  --   NEUTROABS 2.9  --   HGB 9.9* 10.9*  HCT 31.5* 32.0*  MCV 100.3*  --   PLT 92*  --    Cardiac Enzymes:  Recent Labs Lab 11/26/15 1412  TROPONINI 0.06*   CBG: No results for input(s): GLUCAP in the last 168 hours. Iron Studies: No results for input(s): IRON, TIBC, TRANSFERRIN, FERRITIN in the last 72 hours. Studies/Results: Dg Chest Portable 1 View  11/26/2015  CLINICAL DATA:  Weakness EXAM: PORTABLE CHEST 1 VIEW COMPARISON:  10/23/2015 FINDINGS: Cardiomediastinal silhouette is stable. No acute infiltrate or pleural effusion. No pulmonary edema. Bony thorax is unremarkable. IMPRESSION: No active disease. Electronically Signed   By: 11/28/2015 M.D.   On: 11/26/2015 14:09    ROS: As per HPI otherwise negative.   Physical Exam: Filed Vitals:   11/26/15 1316 11/26/15 1447 11/26/15 1500  BP: 124/53 118/44 113/75  Pulse: 35 33   Temp: 97.9 F (36.6 C)    TempSrc: Oral    Resp: 18 18 18   SpO2: 99% 95%      General: Well developed, well nourished, in no acute distress. Head: Normocephalic, atraumatic, sclera non-icteric, mucus membranes are moist Neck: Supple.  JVD not elevated. Lungs: Clear bilaterally to auscultation without wheezes, rales, or rhonchi. Breathing is unlabored. Heart: RRR with S1 S2. No murmurs, rubs, or gallops appreciated. Abdomen: Soft, non-tender, non-distended with normoactive bowel sounds. No rebound/guarding. No obvious abdominal masses. M-S:  Strength and tone appear normal for age. Lower extremities:without edema or ischemic changes, no open wounds 2+ chronic edema LUE  Neuro: Alert and oriented X 3. Moves all extremities spontaneously. Psych:  Responds to questions appropriately with a normal affect. Dialysis Access: LUA AVG sutures in place + bruit. Marked by Dr. Juel Burrow to show areas that can be cannulated.   [facility]   3h  89.5kg  2/2.25 bath  Prof 4  LUA AVG   Hep none Mircera: 200 mcg IV q 2 weeks ( last dose 11/12/14, last HGB 10.6 11/19/15) Venofer: 50 mg IV weekly (last dose recently restarted-give today) Calcitriol: 0.75 mcg PO Q MWF  Assessment/Plan: 1.  Bradycardia: per primary. Having hemodialysis in reference to 06/2015 EP note in hopes that issue resolves. Patient had almost identical presentation Nov 2016 with mild hyperkalemia and junct bradycardia, improved with HD.  Plan HD today.   2.  ESRD -  MWF @ [address] HD today.  3.  Hypertension/volume  - Did not weigh pt-did not want pt to stand up with HR 30. Will attempt UFG 2 liters. Takes amlopdipine 10 mg PO Q hs.  4.  Anemia  - HGB 10.6. Recent ESA dose. Follow HGB 5.  Metabolic bone disease -  Continue sensipar and binders.  6.  Nutrition - Last Albumin 3.7 in center (11/11/15). Change to renal diet with renal vit/nepro. 7.  DM: per primary  Rita H. Manson Passey, NP-C 11/26/2015, 3:51 PM  [name] [phone]  Pt seen, examined, agree w assess/plan as above with additions as indicated.  [name] [employer] pager [phone]    cell [phone] 11/26/2015, 4:29 PM

## 2015-11-26 NOTE — H&P (Signed)
[facility] History and Physical  [name] JGO:[id number] DOB: 1932-08-27 DOA: 11/26/2015  Referring physician: Rubin Payor PCP: [name], DO   Chief Complaint: bradycardia HPI: [name] is a very pleasant 82 y.o. male past medical history that includes chronic kidney disease on dialysis, seizure disorder, traumatic subdural hematoma, diabetes on insulin, hypertension, anemia presents to the emergency department from dialysis Center with the chief complaint of bradycardia. Initial evaluation in the emergency department reveals Junctional rhythm Borderline prolonged QT interval, with a heart rate range 33-42.  Information is obtained from the patient and the wife is at the bedside as patient information somewhat unreliable due to mild to moderate dementia. Patient was at dialysis today where they were initially unable to access his graft. He was sent to [facility] to be evaluated by our for declotting. They found he did not require to be declotted there was an area for access but he was found to be bradycardic with a heart rate in the 30s. Reportedly he could not sit up due to lightheadedness. Patient denies any chest pain palpitation headache visual disturbances syncope or near-syncope. Denies any abdominal pain nausea vomiting diaphoresis lower extremity edema. He denies any fever chills cough recent sick contacts or travel. Wife reports he's been using his normal amount and has been no unintentional weight loss.  In the emergency department he is afebrile hemodynamically stable with a rate 42 is not hypoxic.    Review of Systems:  10 point review of systems complete and all systems are negative except as indicated in the history of present illness  Past Medical History  Diagnosis Date  . Hypertension   . Diabetes mellitus without complication (HCC)   . Arthritis   . History of blood transfusion   . GI bleed   . ESRD (end stage renal disease) (HCC)   . Seizure (HCC)    . Dialysis patient (HCC)   . Subdural hematoma (HCC) 05/2015    from fall  . Sleep apnea     wears CPAP n/c  . Pulmonary hypertension (HCC)     severe pulmonary HTN by 01/14/13 echo   Past Surgical History  Procedure Laterality Date  . Esophagogastroduodenoscopy  10/03/2012    Procedure: ESOPHAGOGASTRODUODENOSCOPY (EGD);  Surgeon: Theda Belfast, MD;  Location: [facility] ENDOSCOPY;  Service: Endoscopy;  Laterality: N/A;  . Colonoscopy  10/04/2012    Procedure: COLONOSCOPY;  Surgeon: Theda Belfast, MD;  Location: [facility] ENDOSCOPY;  Service: Endoscopy;  Laterality: N/A;  . Colonoscopy with esophagogastroduodenoscopy (egd) Left 11/30/2012    Procedure: COLONOSCOPY WITH ESOPHAGOGASTRODUODENOSCOPY (EGD);  Surgeon: Theda Belfast, MD;  Location: [facility] ENDOSCOPY;  Service: Endoscopy;  Laterality: Left;  . Spine surgery  2004    back fusion/ [facility] Penumalli,MD  . Retinal laser procedure Right 04/2015  . Av fistula placement Left 09/09/2015    Procedure: INSERTION OF ARTERIOVENOUS  GORE-TEX GRAFT Left  ARM;  Surgeon: Sherren Kerns, MD;  Location: [facility] OR;  Service: Vascular;  Laterality: Left;   Social History:  reports that he has never smoked. He has never used smokeless tobacco. He reports that he does not drink alcohol or use illicit drugs. Is a retired Scientist, physiological. He is married and lives at home with his wife and is done so for the last 53 years. He is mostly independent with ADLs Allergies  Allergen Reactions  . Aspirin Other (See Comments)    Bleeding   . Lactose Intolerance (Gi) Diarrhea  Family History  Problem Relation Age of Onset  . Sudden death Mother   . Sudden death Father      Prior to Admission medications   Medication Sig Start Date End Date Taking? Authorizing Provider  amLODipine (NORVASC) 10 MG tablet Take 5 mg by mouth Daily.  08/15/12  Yes Historical Provider, MD  calcitRIOL (ROCALTROL) 0.5 MCG capsule Take 2 capsules (1 mcg total) by mouth  every Monday, Wednesday, and Friday with hemodialysis. 05/23/15  Yes Lonia Blood, MD  calcium acetate (PHOSLO) 667 MG capsule Take 3 capsules by mouth three times a day Patient taking differently: Take 2,001 mg by mouth. Take 3 capsules by mouth three times a day 07/15/15  Yes Sharon Seller, NP  cefTAZidime 2 g in dextrose 5 % 50 mL Inject 2 g into the vein every Monday, Wednesday, and Friday with hemodialysis. 10/27/15  Yes Rhetta Mura, MD  cinacalcet (SENSIPAR) 30 MG tablet Take 1 tablet (30 mg total) by mouth daily with supper. 09/18/15  Yes Vassie Loll, MD  colchicine 0.6 MG tablet Take 1 tablet (0.6 mg total) by mouth as needed. For gout. Patient needs an appointment before anymore refills. 09/29/15  Yes [name] L [name], DO  darbepoetin (ARANESP) 60 MCG/0.3ML SOLN injection Inject 0.3 mLs (60 mcg total) into the vein every Thursday with hemodialysis. Patient taking differently: Inject 60 mcg into the vein every 7 (seven) days.  09/29/13  Yes Jessica U Vann, DO  Diphenhydramine-APAP 25-500 MG TABS Take 1 tablet by mouth at bedtime as needed (sleep). For sleep   Yes Historical Provider, MD  docusate sodium (COLACE) 100 MG capsule Take 1 capsule (100 mg total) by mouth 2 (two) times daily. Patient taking differently: Take 100 mg by mouth daily as needed for mild constipation.  09/18/15  Yes Vassie Loll, MD  folic acid (FOLVITE) 1 MG tablet Take 1 tablet (1 mg total) by mouth daily. 11/07/15  Yes [name] L [name], DO  HUMALOG KWIKPEN 100 UNIT/ML KiwkPen Inject 0-10 Units into the skin as needed (prn based on blood sugar reading). Sliding Scale 04/16/15  Yes Historical Provider, MD  lacosamide (VIMPAT) 50 MG TABS tablet 150mg  BID on M-W-F (dialysis days); 100mg  BID on Tue-Thu-Sat-Sun (non-dialysis days) 11/13/15  Yes Vikram R Penumalli, MD  lidocaine (LIDODERM) 5 % Place 1 patch onto the skin daily. Remove & Discard patch within 12 hours or as directed by MD Patient taking differently: Place 1  patch onto the skin daily as needed (pain). Remove & Discard patch within 12 hours or as directed by MD 09/18/15  Yes 01/13/16, MD  multivitamin (RENA-VIT) TABS tablet Take 1 tablet by mouth at bedtime. 09/29/13  Yes Vassie Loll, DO  Nutritional Supplements (FEEDING SUPPLEMENT, NEPRO CARB STEADY,) LIQD Take 237 mLs by mouth 2 (two) times daily between meals. 09/18/15  Yes Joseph Art, MD  pantoprazole (PROTONIX) 40 MG tablet Take 1 tablet (40 mg total) by mouth 2 (two) times daily. 10/27/15  Yes Vassie Loll, MD  sodium chloride 0.9 % SOLN 100 mL with ferric gluconate 12.5 MG/ML SOLN 62.5 mg Inject 62.5 mg into the vein every Thursday with hemodialysis. 09/29/13  Yes Wednesday, DO  traMADol (ULTRAM) 50 MG tablet Take one tablet by mouth every 6 hours as needed for pain. Patient needs appointment before anymore refills. 09/29/15  Yes [name] L [name], DO  gabapentin (NEURONTIN) 100 MG capsule Take 1 capsule (100 mg total) by mouth daily. Patient not taking: Reported on  11/26/2015 05/23/15   Lonia Blood, MD   Physical Exam: Filed Vitals:   11/26/15 1316 11/26/15 1447 11/26/15 1500  BP: 124/53 118/44 113/75  Pulse: 35 33   Temp: 97.9 F (36.6 C)    TempSrc: Oral    Resp: 18 18 18   SpO2: 99% 95%     Wt Readings from Last 3 Encounters:  11/20/15 75.569 kg (166 lb 9.6 oz)  10/27/15 89.4 kg (197 lb 1.5 oz)  09/17/15 90.1 kg (198 lb 10.2 oz)    General:  Appears calm and comfortable Eyes: PERRL, normal lids, irises & conjunctiva ENT: grossly normal hearing, lips & tongue,His membranes of his mouth are dry Neck: no LAD, masses or thyromegaly Cardiovascular: Cardiac but regular, no m/r/g. No LE edema.  Respiratory: CTA bilaterally, no w/r/r. Normal respiratory effort. Abdomen: soft, ntnd is a bowel sounds throughout Skin: no rash or induration seen on limited exam dialysis graft left upper arm dressing dry and intact Musculoskeletal: grossly normal tone  BUE/BLE Psychiatric: grossly normal mood and affect, speech fluent and appropriate Neurologic: grossly non-focal. speech clear facial symmetry           Labs on Admission:  Basic Metabolic Panel:  Recent Labs Lab 11/26/15 1421  NA 136  K 5.6*  CL 102  GLUCOSE 270*  BUN 55*  CREATININE 9.10*   Liver Function Tests: No results for input(s): AST, ALT, ALKPHOS, BILITOT, PROT, ALBUMIN in the last 168 hours. No results for input(s): LIPASE, AMYLASE in the last 168 hours. No results for input(s): AMMONIA in the last 168 hours. CBC:  Recent Labs Lab 11/26/15 1412 11/26/15 1421  WBC 4.6  --   NEUTROABS 2.9  --   HGB 9.9* 10.9*  HCT 31.5* 32.0*  MCV 100.3*  --   PLT 92*  --    Cardiac Enzymes:  Recent Labs Lab 11/26/15 1412  TROPONINI 0.06*    BNP (last 3 results) No results for input(s): BNP in the last 8760 hours.  ProBNP (last 3 results) No results for input(s): PROBNP in the last 8760 hours.  CBG: No results for input(s): GLUCAP in the last 168 hours.  Radiological Exams on Admission: Dg Chest Portable 1 View  11/26/2015  CLINICAL DATA:  Weakness EXAM: PORTABLE CHEST 1 VIEW COMPARISON:  10/23/2015 FINDINGS: Cardiomediastinal silhouette is stable. No acute infiltrate or pleural effusion. No pulmonary edema. Bony thorax is unremarkable. IMPRESSION: No active disease. Electronically Signed   By: Natasha Mead M.D.   On: 11/26/2015 14:09    EKG: Independently reviewed.Junctional rhythm Borderline prolonged QT interval  Assessment/Plan Principal Problem:   Bradycardia Active Problems:   Hypertension   Anemia in chronic kidney disease   Diabetes type 2, controlled (HCC)   ESRD on dialysis (HCC)   Secondary hyperparathyroidism, renal (HCC)   Seizure disorder (HCC)   Hyperkalemia   Dementia   Chronic diastolic congestive heart failure (HCC)  #1. Bradycardia. History of same. Chart review indicates pattern of bradycardia when potassium elevated evaluated by  cardiology in December note indicates that not to be a candidate for pacemaker. Palliative care consult last month. Patient asymptomatic. Potassium 5.6. Other electrolyte abnormalities Home medications include Norvasc but no controlling medications.  -Admit to telemetry -Obtain a TSH -Dialysis -Recheck electrolytes in the morning  #2. End-stage renal disease on dialysis. He is a Monday Wednesday Friday dialysis patient. Difficulty with access today at the facility. -Appreciate nephrology's assistance -Dialysis this evening  3. Hypertension. Controlled. Home medications include amlodipine -We'll  hold amlodipine for now -Monitor blood pressure closely  4. Diabetes. Controlled. Serum glucose 270 on admission. -Obtain hemoglobin A1c -Use sliding scale insulin for optimal control   #5. Hyperkalemia. Potassium 5.6. -Dialysis this evening -Recheck in the morning  #6. Chronic diastolic heart failure. Appears compensated. EF 55% 2014 with grade 1 diastolic dysfunction -Monitor  #7. Dementia. Stable at baseline  #8 anemia. Related to chronic disease. Stable at baseline    Code Status: full DVT Prophylaxis: Family Communication: wife at bedside Disposition Plan: home hopefully in am  Time spent: 75 minutes  [facility] M [facility]

## 2015-11-27 ENCOUNTER — Observation Stay (HOSPITAL_COMMUNITY): Payer: Medicare Other | Admitting: Certified Registered Nurse Anesthetist

## 2015-11-27 ENCOUNTER — Observation Stay (HOSPITAL_COMMUNITY): Payer: Medicare Other

## 2015-11-27 ENCOUNTER — Encounter (HOSPITAL_COMMUNITY)
Admission: EM | Disposition: A | Discharge status: Home / Self Care | Payer: Self-pay | Source: Home / Self Care | Attending: Family Medicine

## 2015-11-27 DIAGNOSIS — T82898A Other specified complication of vascular prosthetic devices, implants and grafts, initial encounter: Secondary | ICD-10-CM | POA: Diagnosis not present

## 2015-11-27 DIAGNOSIS — I5032 Chronic diastolic (congestive) heart failure: Secondary | ICD-10-CM | POA: Diagnosis not present

## 2015-11-27 DIAGNOSIS — N189 Chronic kidney disease, unspecified: Secondary | ICD-10-CM

## 2015-11-27 DIAGNOSIS — E1121 Type 2 diabetes mellitus with diabetic nephropathy: Secondary | ICD-10-CM

## 2015-11-27 DIAGNOSIS — M1990 Unspecified osteoarthritis, unspecified site: Secondary | ICD-10-CM | POA: Diagnosis not present

## 2015-11-27 DIAGNOSIS — N186 End stage renal disease: Secondary | ICD-10-CM | POA: Diagnosis not present

## 2015-11-27 DIAGNOSIS — D631 Anemia in chronic kidney disease: Secondary | ICD-10-CM

## 2015-11-27 DIAGNOSIS — E1122 Type 2 diabetes mellitus with diabetic chronic kidney disease: Secondary | ICD-10-CM | POA: Diagnosis not present

## 2015-11-27 DIAGNOSIS — F0390 Unspecified dementia without behavioral disturbance: Secondary | ICD-10-CM | POA: Diagnosis not present

## 2015-11-27 DIAGNOSIS — R001 Bradycardia, unspecified: Secondary | ICD-10-CM | POA: Diagnosis not present

## 2015-11-27 DIAGNOSIS — E875 Hyperkalemia: Secondary | ICD-10-CM | POA: Diagnosis not present

## 2015-11-27 DIAGNOSIS — Z452 Encounter for adjustment and management of vascular access device: Secondary | ICD-10-CM | POA: Diagnosis not present

## 2015-11-27 HISTORY — PX: INSERTION OF DIALYSIS CATHETER: SHX1324

## 2015-11-27 LAB — GLUCOSE, CAPILLARY
GLUCOSE-CAPILLARY: 220 mg/dL — AB (ref 65–99)
GLUCOSE-CAPILLARY: 267 mg/dL — AB (ref 65–99)
Glucose-Capillary: 91 mg/dL (ref 65–99)
Glucose-Capillary: 52 mg/dL — ABNORMAL LOW (ref 65–99)
Glucose-Capillary: 60 mg/dL — ABNORMAL LOW (ref 65–99)
Glucose-Capillary: 93 mg/dL (ref 65–99)

## 2015-11-27 LAB — CBC
HCT: 30.8 % — ABNORMAL LOW (ref 39.0–52.0)
Hemoglobin: 9.8 g/dL — ABNORMAL LOW (ref 13.0–17.0)
MCH: 31.7 pg (ref 26.0–34.0)
MCHC: 31.8 g/dL (ref 30.0–36.0)
MCV: 99.7 fL (ref 78.0–100.0)
PLATELETS: 93 10*3/uL — AB (ref 150–400)
RBC: 3.09 MIL/uL — AB (ref 4.22–5.81)
RDW: 15.9 % — ABNORMAL HIGH (ref 11.5–15.5)
WBC: 3.2 10*3/uL — ABNORMAL LOW (ref 4.0–10.5)

## 2015-11-27 LAB — BASIC METABOLIC PANEL
ANION GAP: 11 (ref 5–15)
BUN: 31 mg/dL — ABNORMAL HIGH (ref 6–20)
CALCIUM: 8.4 mg/dL — AB (ref 8.9–10.3)
CO2: 27 mmol/L (ref 22–32)
CREATININE: 6.45 mg/dL — AB (ref 0.61–1.24)
Chloride: 99 mmol/L — ABNORMAL LOW (ref 101–111)
GFR, EST AFRICAN AMERICAN: 8 mL/min — AB (ref >60)
GFR, EST NON AFRICAN AMERICAN: 7 mL/min — AB (ref >60)
Glucose, Bld: 245 mg/dL — ABNORMAL HIGH (ref 65–99)
Potassium: 4.3 mmol/L (ref 3.5–5.1)
SODIUM: 137 mmol/L (ref 135–145)

## 2015-11-27 SURGERY — INSERTION OF DIALYSIS CATHETER
Anesthesia: Monitor Anesthesia Care | Laterality: Right

## 2015-11-27 MED ORDER — OXYCODONE-ACETAMINOPHEN 5-325 MG PO TABS
1.0000 | ORAL_TABLET | ORAL | Status: DC | PRN
  Administered 2015-11-27 – 2015-11-28 (×2): 2 via ORAL
  Filled 2015-11-27: qty 2

## 2015-11-27 MED ORDER — ATROPINE SULFATE 0.1 MG/ML IJ SOLN
INTRAMUSCULAR | Status: AC
  Filled 2015-11-27: qty 10

## 2015-11-27 MED ORDER — HEPARIN SODIUM (PORCINE) 1000 UNIT/ML IJ SOLN
INTRAMUSCULAR | Status: AC
  Filled 2015-11-27: qty 1

## 2015-11-27 MED ORDER — CEFAZOLIN SODIUM 1-5 GM-% IV SOLN
1.0000 g | INTRAVENOUS | Status: AC
  Administered 2015-11-27: 1 g via INTRAVENOUS
  Filled 2015-11-27: qty 50

## 2015-11-27 MED ORDER — SODIUM CHLORIDE 0.9 % IV SOLN
INTRAVENOUS | Status: DC | PRN
  Administered 2015-11-27: 14:00:00

## 2015-11-27 MED ORDER — OXYCODONE-ACETAMINOPHEN 5-325 MG PO TABS
ORAL_TABLET | ORAL | Status: AC
  Administered 2015-11-27: 2 via ORAL
  Filled 2015-11-27: qty 2

## 2015-11-27 MED ORDER — PROPOFOL 1000 MG/100ML IV EMUL
INTRAVENOUS | Status: AC
  Filled 2015-11-27: qty 100

## 2015-11-27 MED ORDER — HEPARIN SODIUM (PORCINE) 1000 UNIT/ML IJ SOLN
INTRAMUSCULAR | Status: DC | PRN
  Administered 2015-11-27: 1000 [IU]

## 2015-11-27 MED ORDER — LIDOCAINE-EPINEPHRINE (PF) 1 %-1:200000 IJ SOLN
INTRAMUSCULAR | Status: DC | PRN
Start: 2015-11-27 — End: 2015-11-27
  Administered 2015-11-27: 30 mL

## 2015-11-27 MED ORDER — PROPOFOL 500 MG/50ML IV EMUL
INTRAVENOUS | Status: DC | PRN
  Administered 2015-11-27: 50 ug/kg/min via INTRAVENOUS

## 2015-11-27 MED ORDER — PROPOFOL 10 MG/ML IV BOLUS
INTRAVENOUS | Status: AC
  Filled 2015-11-27: qty 20

## 2015-11-27 MED ORDER — 0.9 % SODIUM CHLORIDE (POUR BTL) OPTIME
TOPICAL | Status: DC | PRN
  Administered 2015-11-27: 1000 mL

## 2015-11-27 MED ORDER — SODIUM CHLORIDE 0.9 % IV SOLN
INTRAVENOUS | Status: DC
  Administered 2015-11-27: 15:00:00 via INTRAVENOUS

## 2015-11-27 MED ORDER — COLCHICINE 0.6 MG PO TABS: 0.3000 mg | ORAL_TABLET | ORAL | Status: DC

## 2015-11-27 MED ORDER — LIDOCAINE-EPINEPHRINE (PF) 1 %-1:200000 IJ SOLN
INTRAMUSCULAR | Status: AC
Start: 2015-11-27 — End: 2015-11-27
  Filled 2015-11-27: qty 30

## 2015-11-27 SURGICAL SUPPLY — 38 items
BAG DECANTER FOR FLEXI CONT (MISCELLANEOUS) ×3 IMPLANT
BIOPATCH RED 1 DISK 7.0 (GAUZE/BANDAGES/DRESSINGS) ×2 IMPLANT
BIOPATCH RED 1IN DISK 7.0MM (GAUZE/BANDAGES/DRESSINGS) ×1
CATH CANNON HEMO 15FR 23CM (HEMODIALYSIS SUPPLIES) ×2 IMPLANT
CHLORAPREP W/TINT 26ML (MISCELLANEOUS) ×3 IMPLANT
COVER PROBE W GEL 5X96 (DRAPES) ×2 IMPLANT
DRAPE C-ARM 42X72 X-RAY (DRAPES) ×3 IMPLANT
DRAPE CHEST BREAST 15X10 FENES (DRAPES) ×3 IMPLANT
GAUZE SPONGE 2X2 8PLY STRL LF (GAUZE/BANDAGES/DRESSINGS) ×1 IMPLANT
GAUZE SPONGE 4X4 16PLY XRAY LF (GAUZE/BANDAGES/DRESSINGS) ×3 IMPLANT
GLOVE BIO SURGEON STRL SZ7.5 (GLOVE) ×3 IMPLANT
GLOVE BIOGEL PI IND STRL 7.0 (GLOVE) IMPLANT
GLOVE BIOGEL PI IND STRL 8 (GLOVE) ×1 IMPLANT
GLOVE BIOGEL PI INDICATOR 7.0 (GLOVE) ×2
GLOVE BIOGEL PI INDICATOR 8 (GLOVE) ×2
GLOVE ECLIPSE 7.5 STRL STRAW (GLOVE) ×4 IMPLANT
GOWN STRL REUS W/ TWL LRG LVL3 (GOWN DISPOSABLE) ×2 IMPLANT
GOWN STRL REUS W/TWL LRG LVL3 (GOWN DISPOSABLE) ×3
GOWN STRL REUS W/TWL XL LVL3 (GOWN DISPOSABLE) ×4 IMPLANT
KIT BASIN OR (CUSTOM PROCEDURE TRAY) ×3 IMPLANT
KIT ROOM TURNOVER OR (KITS) ×3 IMPLANT
NDL 18GX1X1/2 (RX/OR ONLY) (NEEDLE) ×1 IMPLANT
NDL HYPO 25GX1X1/2 BEV (NEEDLE) ×1 IMPLANT
NEEDLE 18GX1X1/2 (RX/OR ONLY) (NEEDLE) ×3 IMPLANT
NEEDLE 22X1 1/2 (OR ONLY) (NEEDLE) ×3 IMPLANT
NEEDLE HYPO 25GX1X1/2 BEV (NEEDLE) ×3 IMPLANT
NS IRRIG 1000ML POUR BTL (IV SOLUTION) ×3 IMPLANT
PACK SURGICAL SETUP 50X90 (CUSTOM PROCEDURE TRAY) ×3 IMPLANT
PAD ARMBOARD 7.5X6 YLW CONV (MISCELLANEOUS) ×6 IMPLANT
SPONGE GAUZE 2X2 STER 10/PKG (GAUZE/BANDAGES/DRESSINGS) ×4
SUT ETHILON 3 0 PS 1 (SUTURE) ×3 IMPLANT
SUT VICRYL 4-0 PS2 18IN ABS (SUTURE) ×3 IMPLANT
SYR 20CC LL (SYRINGE) ×6 IMPLANT
SYR 5ML LL (SYRINGE) ×6 IMPLANT
SYR CONTROL 10ML LL (SYRINGE) ×3 IMPLANT
SYRINGE 10CC LL (SYRINGE) ×3 IMPLANT
TAPE CLOTH SURG 4X10 WHT LF (GAUZE/BANDAGES/DRESSINGS) ×4 IMPLANT
WATER STERILE IRR 1000ML POUR (IV SOLUTION) ×3 IMPLANT

## 2015-11-27 NOTE — Transfer of Care (Signed)
Immediate Anesthesia Transfer of Care Note  Patient: [name]  Procedure(s) Performed: Procedure(s): INSERTION OF DIALYSIS CATHETER - Right Internal Jugular Placement (Right)  Patient Location: PACU  Anesthesia Type:MAC  Level of Consciousness: awake and alert   Airway & Oxygen Therapy: Patient Spontanous Breathing  Post-op Assessment: Report given to RN and Post -op Vital signs reviewed and stable  Post vital signs: Reviewed and stable  Last Vitals:  Filed Vitals:   11/27/15 1304 11/27/15 1520  BP: 128/60 108/41  Pulse: 72 75  Temp: 36.4 C 36.4 C  Resp:  17    Complications: No apparent anesthesia complications

## 2015-11-27 NOTE — Consult Note (Signed)
History of Present Illness:  Patient is a 82 y.o. year old male who presents for placement of a permanent hemodialysis access.   This patient is currently on HD.  He had a left AV graft placed by Dr. Darrick Penna 09/09/2015.  This graft has been a difficult stick since he has started using it.  He was seen by Dr. Juel Burrow after the lab encounter problems with clotting a few days ago and per Dr. Arlean Hopping the IJ is stenosis.   He has had several episodes of bradycardia that can contribute to low flow volume and poor stick results.  He has declined a Visual merchandiser.  He has had previous access in the right UE from [place] which occluded date unknown.  His previous vein mapping showed  no suitable cephalic vein left or right arm vein is marginal in the basilic left and right arm as well. Patient has essentially normal brachial bifurcation but does have calcified vessels.  The patient is very frustrated and is not sure what he wants to do, but he is tired of being stuck multiple attempts every time he goes to HD.  He did have HD yesterday with difficult sticks.     Past Medical History  Diagnosis Date  . Hypertension   . Diabetes mellitus without complication (HCC)   . Arthritis   . History of blood transfusion   . GI bleed   . ESRD (end stage renal disease) (HCC)   . Seizure (HCC)   . Dialysis patient (HCC)   . Subdural hematoma (HCC) 05/2015    from fall  . Sleep apnea     wears CPAP n/c  . Pulmonary hypertension (HCC)     severe pulmonary HTN by 01/14/13 echo    Past Surgical History  Procedure Laterality Date  . Esophagogastroduodenoscopy  10/03/2012    Procedure: ESOPHAGOGASTRODUODENOSCOPY (EGD);  Surgeon: Theda Belfast, MD;  Location: [facility] ENDOSCOPY;  Service: Endoscopy;  Laterality: N/A;  . Colonoscopy  10/04/2012    Procedure: COLONOSCOPY;  Surgeon: Theda Belfast, MD;  Location: [facility] ENDOSCOPY;  Service: Endoscopy;  Laterality: N/A;  . Colonoscopy with esophagogastroduodenoscopy (egd) Left 11/30/2012     Procedure: COLONOSCOPY WITH ESOPHAGOGASTRODUODENOSCOPY (EGD);  Surgeon: Theda Belfast, MD;  Location: [facility] ENDOSCOPY;  Service: Endoscopy;  Laterality: Left;  . Spine surgery  2004    back fusion/ [facility] Penumalli,MD  . Retinal laser procedure Right 04/2015  . Av fistula placement Left 09/09/2015    Procedure: INSERTION OF ARTERIOVENOUS  GORE-TEX GRAFT Left  ARM;  Surgeon: Sherren Kerns, MD;  Location: [facility] OR;  Service: Vascular;  Laterality: Left;     Social History Social History  Substance Use Topics  . Smoking status: Never Smoker   . Smokeless tobacco: Never Used  . Alcohol Use: No    Family History Family History  Problem Relation Age of Onset  . Sudden death Mother   . Sudden death Father     Allergies  Allergies  Allergen Reactions  . Aspirin Other (See Comments)    Bleeding   . Lactose Intolerance (Gi) Diarrhea     Current Facility-Administered Medications  Medication Dose Route Frequency Provider Last Rate Last Dose  . acetaminophen (TYLENOL) tablet 650 mg  650 mg Oral Q6H PRN Gwenyth Bender, NP       Or  . acetaminophen (TYLENOL) suppository 650 mg  650 mg Rectal Q6H PRN Gwenyth Bender, NP      . Melene Muller ON 11/28/2015]  calcitRIOL (ROCALTROL) capsule 0.75 mcg  0.75 mcg Oral Q M,W,F-HD Pola Corn, NP      . cinacalcet [facility]) tablet 60 mg  60 mg Oral Q supper Pola Corn, NP      . Melene Muller ON 11/28/2015] colchicine tablet 0.3 mg  0.3 mg Oral Q M,W,F-2000 Delano Metz, MD      . docusate sodium (COLACE) capsule 100 mg  100 mg Oral Daily PRN Gwenyth Bender, NP      . feeding supplement (NEPRO CARB STEADY) liquid 237 mL  237 mL Oral BID BM Pola Corn, NP      . folic acid (FOLVITE) tablet 1 mg  1 mg Oral Daily Lesle Chris Black, NP      . insulin aspart (novoLOG) injection 0-15 Units  0-15 Units Subcutaneous TID WC Lesle Chris Black, NP      . insulin aspart (novoLOG) injection 0-5 Units  0-5 Units Subcutaneous QHS Gwenyth Bender, NP   0 Units at  11/26/15 2302  . lidocaine (LIDODERM) 5 % 1 patch  1 patch Transdermal Daily PRN Gwenyth Bender, NP      . pantoprazole (PROTONIX) EC tablet 40 mg  40 mg Oral BID Gwenyth Bender, NP   40 mg at 11/26/15 2301  . traMADol (ULTRAM) tablet 50 mg  50 mg Oral Q6H PRN Gwenyth Bender, NP      . zolpidem (AMBIEN) tablet 5 mg  5 mg Oral QHS PRN Ozella Rocks, MD       Facility-Administered Medications Ordered in Other Encounters  Medication Dose Route Frequency Provider Last Rate Last Dose  . Chlorhexidine Gluconate Cloth 2 % PADS 6 each  6 each Topical Once Sherren Kerns, MD        ROS:   General:  No weight loss, Fever, chills  HEENT: No recent headaches, no nasal bleeding, no visual changes, no sore throat  Neurologic: No dizziness, blackouts, seizures. No recent symptoms of stroke or mini- stroke. No recent episodes of slurred speech, or temporary blindness.  Cardiac: No recent episodes of chest pain/pressure, no shortness of breath at rest.  No shortness of breath with exertion.  Denies history of atrial fibrillation or irregular heartbeat  Vascular: No history of rest pain in feet.  No history of claudication.  No history of non-healing ulcer, No history of DVT   Pulmonary: No home oxygen, no productive cough, no hemoptysis,  No asthma or wheezing  Musculoskeletal:  [ ]  Arthritis, [ ]  Low back pain,  [ ]  Joint pain [x]  left swollen UE  Hematologic:No history of hypercoagulable state.  No history of easy bleeding.  No history of anemia  Gastrointestinal: No hematochezia or melena,  No gastroesophageal reflux, no trouble swallowing  Urinary: [ ]  chronic Kidney disease, [ ]  on HD - [ ]  MWF or [ ]  TTHS, [ ]  Burning with urination, [ ]  Frequent urination, [ ]  Difficulty urinating;   Skin: No rashes  Psychological: No history of anxiety,  No history of depression   Physical Examination  Filed Vitals:   11/26/15 1930 11/26/15 1945 11/26/15 2026 11/27/15 0532  BP: 126/63 138/49 140/71  120/52  Pulse: 81 79 80 67  Temp:  98.1 F (36.7 C) 98.2 F (36.8 C) 98.3 F (36.8 C)  TempSrc:  Oral Oral Oral  Resp: 18 12 18 18   Height:   6' (1.829 m)   Weight:   198 lb 3.1 oz (89.9 kg) 187 lb  6.3 oz (85 kg)  SpO2:   100% 96%    Body mass index is 25.41 kg/(m^2).  General:  Alert and oriented, no acute distress HEENT: Normal Neck: No bruit or JVD Pulmonary: Clear to auscultation bilaterally Cardiac: Regular Rate and Rhythm without murmur Gastrointestinal: Soft, non-tender, non-distended, no mass, no scars Skin: No rash Extremity Pulses:  2+ radial, brachial pulses bilaterally Musculoskeletal: No deformity left UE edema  Neurologic: Upper and lower extremity motor 5/5 and symmetric      ASSESSMENT:  ESRD currently on HD M-W-F Last HD yesterday 11/26/2015   PLAN: Needs Catheter placement and possible permanent access.   He is very frustrated and may benefit from Catheter now and then out patient access work up.    Thomasena Edis, EMMA [facility] PA-C [facility] [place] Office: [phone]  Agree with above. Plan placement of a tunneled dialysis catheter today.  Waverly Ferrari, MD, FACS Beeper [phone] Office: [phone]

## 2015-11-27 NOTE — Anesthesia Procedure Notes (Signed)
Procedure Name: MAC Date/Time: 11/27/2015 2:40 PM Performed by: Margaree Mackintosh Pre-anesthesia Checklist: Patient identified, Emergency Drugs available, Suction available, Patient being monitored and Timeout performed Oxygen Delivery Method: Simple face mask Preoxygenation: Pre-oxygenation with 100% oxygen Intubation Type: IV induction Placement Confirmation: positive ETCO2 Dental Injury: Teeth and Oropharynx as per pre-operative assessment

## 2015-11-27 NOTE — Care Management Obs Status (Signed)
MEDICARE OBSERVATION STATUS NOTIFICATION   Patient Details  Name: [name] MRN: [id number] Date of Birth: [birth year]   Medicare Observation Status Notification Given:  Yes    Darrold Span, RN 11/27/2015, 10:59 AM

## 2015-11-27 NOTE — Op Note (Signed)
11/27/2015  PREOP DIAGNOSIS: Chronic kidney disease  POSTOP DIAGNOSIS: Chronic kidney disease  PROCEDURE: Ultrasound guided placement of Right IJ tunneled dialysis catheter  (23 cm)  SURGEON: Di Kindle. Edilia Bo, MD, FACS  ASSIST: none  ANESTHESIA: local with sedation   EBL: minimal  FINDINGS: patent Right IJ  INDICATIONS: The patient has a swollen left arm and a poorly functioning fistula. I was asked to place a tunneled dialysis catheter.  TECHNIQUE: The patient was taken to the operating room and sedated by anesthesia. The neck and upper chest were prepped and draped in the usual sterile fashion. After the skin was anesthetized with 1% lidocaine, and under ultrasound guidance, the Right IJ was cannulated and a guidewire introduced into the superior vena cava under fluoroscopic control. The tract over the wire was dilated and then the dilator and peel-away sheath were passed over the wire and the wire and dilator removed. The catheter was passed through the peel-away sheath and positioned in the right atrium. The exit site for the catheter was selected and the skin anesthetized between the 2 areas. The catheter was then brought through the tunnel, cut to the appropriate length, and the distal ports were attached. Both ports withdrew easily, were then flushed with heparinized saline and filled with concentrated heparin. The catheter was secured at its exit site with a 3-0 nylon suture. The IJ cannulation site was closed with a 4-0 subcuticular stitch. A sterile dressing was applied. The patient tolerated the procedure well and was transferred to the recovery room in stable condition. All needle and sponge counts were correct.  Waverly Ferrari, MD, FACS [facility] [place]  DATE OF OPERATION: 11/27/2015 DATE OF DICTATION: 11/27/2015

## 2015-11-27 NOTE — Progress Notes (Signed)
Patient lying in bed, no distress pain or needs at this time.Patient was able to ambulate to the bathroom with the walker with little assistance. Call light within reach.

## 2015-11-27 NOTE — Progress Notes (Signed)
[facility]                                                                              Patient Demographics  [name], is a 82 y.o. male, DOB - [birth year], XBJ:[id number]  Admit date - 11/26/2015   Admitting Physician Ozella Rocks, MD  Outpatient Primary MD for the patient is [name], [name], DO  LOS -   days    Chief Complaint  Patient presents with  . Bradycardia       Brief HPI  82 y.o. male With chronic kidney disease on dialysis, seizure disorder, traumatic subdural hematoma, diabetes on insulin, hypertension, anemia presented from dialysis Center due to bradycardia. Patient had Junctional rhythm Borderline prolonged QT interval, with a heart rate range 33-42 on EKG. Patient was at dialysis where they were initially unable to access his graft. He was sent to [facility] to be evaluated by our for declotting. They found he did not require to be declotted there was an area for access but he was found to be bradycardic with a heart rate in the 30s. Reportedly he could not sit up due to lightheadedness. In ED, afebrile, heart rate 42, not hypoxic.   Assessment & Plan    Sinus Bradycardia - Records indicate pattern of bradycardia with hyperkalemia, was evaluated by EP cardiology, Dr Elberta Fortis in December (note 08/26/15), cardiology recommended to avoid pacemaker placement if possible due to high risk of infection and HD needs. - Patient was noted to have potassium 5.6 at the time of admission.  - Patient's bradycardia resolved after dialysis. I did call cardiology consult this am if pacemaker would be indicated this time as this is becoming a recurrent issue even though it seems to be due to electrolyte abnormalities. However cardiology indicated that patient's bradycardia is due to reversible electrolyte abn/hyperkalemia. I also discussed with nephrology, Dr. 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     End-stage renal disease on dialysis, MWF with access difficulties - Appreciate nephrology recommendations, discussed with Dr Arlean Hopping in detail., Patient had hemodialysis yesterday/Wed per his schedule. - Vascular surgery also consulted for access  Hypertension. Controlled. Home medications include amlodipine - Currently BP stable, holding Norvasc  Diabetes mellitus type II, uncontrolled with chronic kidney disease -Obtain hemoglobin A1c -Use sliding scale insulin for optimal control   Hyperkalemia Resolved after hemodialysis   Chronic diastolic heart failure. Appears compensated. EF 55% 2014 with grade 1 diastolic dysfunction - volume management with hemodialysis  Dementia. Stable at baseline  anemia. Related to chronic disease. Stable at baseline  Code Status: Full   Family Communication: Discussed in detail with the patient, all imaging results, lab results explained to the patient   Disposition Plan:   Time Spent in minutes   25 minutes  Procedures  HD  Consults   Renal   DVT Prophylaxis  SCD's   Medications  Scheduled Meds: . [START ON 11/28/2015] calcitRIOL  0.75 mcg Oral Q M,W,F-HD  . cinacalcet  60 mg Oral Q supper  . [  START ON 11/28/2015] colchicine  0.3 mg Oral Q M,W,F-2000  . feeding supplement (NEPRO CARB STEADY)  237 mL Oral BID BM  . folic acid  1 mg Oral Daily  . insulin aspart  0-15 Units Subcutaneous TID WC  . insulin aspart  0-5 Units Subcutaneous QHS  . pantoprazole  40 mg Oral BID   Continuous Infusions:  PRN Meds:.acetaminophen **OR** acetaminophen, docusate sodium, lidocaine, traMADol, zolpidem   Antibiotics   Anti-infectives    None        Subjective:   [name] was seen and examined today.   Patient denies dizziness, chest pain, shortness of breath, abdominal pain, N/V/D/C, new weakness, numbess, tingling. No acute events  overnight.    Objective:   Filed Vitals:   11/26/15 1930 11/26/15 1945 11/26/15 2026 11/27/15 0532  BP: 126/63 138/49 140/71 120/52  Pulse: 81 79 80 67  Temp:  98.1 F (36.7 C) 98.2 F (36.8 C) 98.3 F (36.8 C)  TempSrc:  Oral Oral Oral  Resp: 18 12 18 18   Height:   6' (1.829 m)   Weight:   89.9 kg (198 lb 3.1 oz) 85 kg (187 lb 6.3 oz)  SpO2:   100% 96%    Intake/Output Summary (Last 24 hours) at 11/27/15 1117 Last data filed at 11/27/15 0800  Gross per 24 hour  Intake    360 ml  Output   3750 ml  Net  -3390 ml     Wt Readings from Last 3 Encounters:  11/27/15 85 kg (187 lb 6.3 oz)  11/20/15 75.569 kg (166 lb 9.6 oz)  10/27/15 89.4 kg (197 lb 1.5 oz)     Exam  General: Alert and oriented x 3, NAD  HEENT:   Neck:   CVS: S1 S2 auscultated, no rubs, murmurs or gallops. Regular rate and rhythm.  Respiratory: Clear to auscultation bilaterally, no wheezing, rales or rhonchi  Abdomen: Soft, nontender, nondistended, + bowel sounds  Ext: no cyanosis clubbing or edema  Neuro: AAOx3, Cr N's II- XII. Strength 5/5 upper and lower extremities bilaterally  Skin: No rashes  Psych: Normal affect and demeanor, alert and oriented x3    Data Reviewed:  I have personally reviewed following labs and imaging studies  Micro Results No results found for this or any previous visit (from the past 240 hour(s)).  Radiology Reports Dg Chest Portable 1 View  11/26/2015  CLINICAL DATA:  Weakness EXAM: PORTABLE CHEST 1 VIEW COMPARISON:  10/23/2015 FINDINGS: Cardiomediastinal silhouette is stable. No acute infiltrate or pleural effusion. No pulmonary edema. Bony thorax is unremarkable. IMPRESSION: No active disease. Electronically Signed   By: 10/25/2015 M.D.   On: 11/26/2015 14:09    CBC  Recent Labs Lab 11/26/15 1412 11/26/15 1421 11/27/15 0856  WBC 4.6  --  3.2*  HGB 9.9* 10.9* 9.8*  HCT 31.5* 32.0* 30.8*  PLT 92*  --  93*  MCV 100.3*  --  99.7  MCH 31.5  --  31.7    MCHC 31.4  --  31.8  RDW 16.5*  --  15.9*  LYMPHSABS 1.3  --   --   MONOABS 0.3  --   --   EOSABS 0.2  --   --   BASOSABS 0.0  --   --     Chemistries   Recent Labs Lab 11/26/15 1421 11/27/15 0856  NA 136 137  K 5.6* 4.3  CL 102 99*  CO2  --  27  GLUCOSE 270* 245*  BUN 55* 31*  CREATININE 9.10* 6.45*  CALCIUM  --  8.4*   ------------------------------------------------------------------------------------------------------------------ estimated creatinine clearance is 9.9 mL/min (by C-G formula based on Cr of 6.45). ------------------------------------------------------------------------------------------------------------------ No results for input(s): HGBA1C in the last 72 hours. ------------------------------------------------------------------------------------------------------------------ No results for input(s): CHOL, HDL, LDLCALC, TRIG, CHOLHDL, LDLDIRECT in the last 72 hours. ------------------------------------------------------------------------------------------------------------------  Recent Labs  11/26/15 1825  TSH 1.427   ------------------------------------------------------------------------------------------------------------------ No results for input(s): VITAMINB12, FOLATE, FERRITIN, TIBC, IRON, RETICCTPCT in the last 72 hours.  Coagulation profile No results for input(s): INR, PROTIME in the last 168 hours.  No results for input(s): DDIMER in the last 72 hours.  Cardiac Enzymes  Recent Labs Lab 11/26/15 1412  TROPONINI 0.06*   ------------------------------------------------------------------------------------------------------------------ Invalid input(s): POCBNP   Recent Labs  11/26/15 2244  GLUCAP 193*     Neven Fina M.D. [facility] 11/27/2015, 11:17 AM  Pager: [phone] Between 7am to 7pm - call Pager - [phone]  After 7pm go to [email or web address] - password [email or web address]  Call night coverage person covering after  7pm

## 2015-11-27 NOTE — Anesthesia Postprocedure Evaluation (Signed)
Anesthesia Post Note  Patient: [name]  Procedure(s) Performed: Procedure(s) (LRB): INSERTION OF DIALYSIS CATHETER - Right Internal Jugular Placement (Right)  Patient location during evaluation: PACU Anesthesia Type: MAC Level of consciousness: awake and alert Pain management: pain level controlled Vital Signs Assessment: post-procedure vital signs reviewed and stable Respiratory status: spontaneous breathing, nonlabored ventilation, respiratory function stable and patient connected to nasal cannula oxygen Cardiovascular status: stable and blood pressure returned to baseline Anesthetic complications: no    Last Vitals:  Filed Vitals:   11/27/15 1630 11/27/15 1648  BP:  121/40  Pulse: 75 76  Temp: 36.7 C 36.8 C  Resp: 11 16    Last Pain:  Filed Vitals:   11/27/15 1723  PainSc: 4                  Kennieth Rad

## 2015-11-27 NOTE — Progress Notes (Addendum)
  [facility] Progress Note   Subjective: very frustrated, "why don't they just kill me, shoot me in the head"  Filed Vitals:   11/26/15 1930 11/26/15 1945 11/26/15 2026 11/27/15 0532  BP: 126/63 138/49 140/71 120/52  Pulse: 81 79 80 67  Temp:  98.1 F (36.7 C) 98.2 F (36.8 C) 98.3 F (36.8 C)  TempSrc:  Oral Oral Oral  Resp: 18 12 18 18   Height:   6' (1.829 m)   Weight:   89.9 kg (198 lb 3.1 oz) 85 kg (187 lb 6.3 oz)  SpO2:   100% 96%    Inpatient medications: . [START ON 11/28/2015] calcitRIOL  0.75 mcg Oral Q M,W,F-HD  . cinacalcet  60 mg Oral Q supper  . colchicine  0.6 mg Oral Daily  . feeding supplement (NEPRO CARB STEADY)  237 mL Oral BID BM  . folic acid  1 mg Oral Daily  . insulin aspart  0-15 Units Subcutaneous TID WC  . insulin aspart  0-5 Units Subcutaneous QHS  . pantoprazole  40 mg Oral BID     acetaminophen **OR** acetaminophen, docusate sodium, lidocaine, traMADol, zolpidem  Exam: Alert, clear, frustrated No jvd Chest clear bilat RRR no mrg Abd soft ntnd no mass or ascite LUE 2+ edema, +bruit over AVG No LE edema Neuro nonfocal, alert  [facility]: [facility]   3h 11/30/2015 89.5kg 2/2.25 bath Prof 4 LUA AVG Hep none Mircera: 200 mcg IV q 2 weeks ( last dose 11/12/14, last HGB 10.6 11/19/15) Venofer: 50 mg IV weekly (last dose recently restarted-give today) Calcitriol: 0.75 mcg PO Q MWF        Assessment: 1 Bradycardia - resolved w Rx of hyperkalemia 2 LUE swelling - unable to stick AVG reliably.  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 11/21/15 and couldn't be crossed for angioplasty.  Have d/w staff at the outpatient center, the graft is very difficult to stick now that the arm is so swollen. I have d/w patient , we have decided to give up on the LUA AVG and will ask [facility] to see for graft ligation/ TDC placement and planning for new perm access.  Patient is agreeable.  3 HTN cont norvasc 4 Volume is 3 kg under dry wt 5 Anemia  recent esa dose 6 MBD cont meds 7 DM per primary 8 ESRD HD MWF  Plan - as above.    Juel Burrow MD [facility] pager [phone]    cell [phone] 11/27/2015, 10:01 AM    Recent Labs Lab 11/26/15 1421  NA 136  K 5.6*  CL 102  GLUCOSE 270*  BUN 55*  CREATININE 9.10*   No results for input(s): AST, ALT, ALKPHOS, BILITOT, PROT, ALBUMIN in the last 168 hours.  Recent Labs Lab 11/26/15 1412 11/26/15 1421 11/27/15 0856  WBC 4.6  --  3.2*  NEUTROABS 2.9  --   --   HGB 9.9* 10.9* 9.8*  HCT 31.5* 32.0* 30.8*  MCV 100.3*  --  99.7  PLT 92*  --  93*

## 2015-11-27 NOTE — Progress Notes (Signed)
Inpatient Diabetes Program Recommendations  AACE/ADA: New Consensus Statement on Inpatient Glycemic Control (2015)  Target Ranges:  Prepandial:   less than 140 mg/dL      Peak postprandial:   less than 180 mg/dL (1-2 hours)      Critically ill patients:  140 - 180 mg/dL  Results for [name], [name] (MRN [id number]) as of 11/27/2015 10:59  Ref. Range 11/26/2015 14:21 11/27/2015 08:56  Glucose Latest Ref Range: 65-99 mg/dL 270 (H) 245 (H)   Results for [name], [name] (MRN [id number]) as of 11/27/2015 10:59  Ref. Range 11/26/2015 22:44  Glucose-Capillary Latest Ref Range: 65-99 mg/dL 193 (H)  Results for [name], [name] (MRN [id number]) as of 11/27/2015 10:59  Ref. Range 10/23/2015 14:15  Hemoglobin A1C Latest Ref Range: 4.8-5.6 % 5.3   Review of Glycemic Control  Diabetes history: DM2 Outpatient Diabetes medications: None Current orders for Inpatient glycemic control: Novolog 0-15 units TID with meals, Novolog 0-5 units QHS  Inpatient Diabetes Program Recommendations: Correction (SSI): Noted patient did not receive Novolog correction this morning. Talked with RN and she states that the glucose was noted checked this morning so Novolog was not given. Lab glucose of 245 mg/dl at 8:56 am today. NURSING: Please be sure glucose is checked as ordered ACHS and Novolog correction is admisinstered when parameters are met.  Thanks, Barnie Alderman, RN, MSN, CDE Diabetes Coordinator Inpatient Diabetes Program [phone] (Team Pager from [place] to [place]) [phone] ([place] office) [phone] [facility] office) [phone] [facility] office)

## 2015-11-27 NOTE — Anesthesia Preprocedure Evaluation (Signed)
Anesthesia Evaluation  Patient identified by MRN, date of birth, ID band Patient confused    Reviewed: Allergy & Precautions, NPO status , Patient's Chart, lab work & pertinent test results  History of Anesthesia Complications Negative for: history of anesthetic complications  Airway Mallampati: II  TM Distance: >3 FB Neck ROM: Full    Dental  (+) Partial Upper   Pulmonary sleep apnea and Continuous Positive Airway Pressure Ventilation ,    breath sounds clear to auscultation       Cardiovascular hypertension, Pt. on medications +CHF   Rhythm:Regular     Neuro/Psych Seizures -,  H/o subdural  Neuromuscular disease negative psych ROS   GI/Hepatic   Endo/Other  diabetes, Type 2, Insulin Dependent  Renal/GU ESRF and DialysisRenal disease     Musculoskeletal  (+) Arthritis ,   Abdominal   Peds  Hematology  (+) anemia ,   Anesthesia Other Findings   Reproductive/Obstetrics                             Anesthesia Physical Anesthesia Plan  ASA: III  Anesthesia Plan: MAC   Post-op Pain Management:    Induction: Intravenous  Airway Management Planned: Natural Airway, Nasal Cannula and Simple Face Mask  Additional Equipment: None  Intra-op Plan:   Post-operative Plan:   Informed Consent: I have reviewed the patients History and Physical, chart, labs and discussed the procedure including the risks, benefits and alternatives for the proposed anesthesia with the patient or authorized representative who has indicated his/her understanding and acceptance.   Dental advisory given  Plan Discussed with: CRNA and Surgeon  Anesthesia Plan Comments:         Anesthesia Quick Evaluation

## 2015-11-27 NOTE — Care Management Note (Signed)
Case Management Note Donn Pierini RN, BSN Unit 2W-Case Manager [phone]  Patient Details  Name: [name] MRN: [id number] Date of Birth: 1931/09/26  Subjective/Objective:   Pt admitted with bradycardia and HD access issues                 Action/Plan: PTA pt lived at home with wife- was active with [name] for HH-RN/PT/OT- spoke with pt at bedside- who states that he feels HH is helping and wants to continue- have confirmed services with Corrie Dandy at [facility]- who states that pt has been struggling with being able to participate in [facility]- but they will continue to follow post discharge- ? If pt might benefit from [facility] consult for GOC- pt seems very frustrated over HD situation.   Expected Discharge Date:                  Expected Discharge Plan:  Home w Home Health Services  In-House Referral:     Discharge planning Services  CM Consult  Post Acute Care Choice:  Home Health, Resumption of Svcs/PTA Provider Choice offered to:  Patient  DME Arranged:    DME Agency:     HH Arranged:  RN, PT, OT HH Agency:  [facility]  Status of Service:  In process, will continue to follow  Medicare Important Message Given:    Date Medicare IM Given:    Medicare IM give by:    Date Additional Medicare IM Given:    Additional Medicare Important Message give by:     If discussed at Long Length of Stay Meetings, dates discussed:    Additional Comments:  Darrold Span, RN 11/27/2015, 4:00 PM

## 2015-11-28 ENCOUNTER — Encounter (HOSPITAL_COMMUNITY): Payer: Self-pay | Admitting: Vascular Surgery

## 2015-11-28 ENCOUNTER — Other Ambulatory Visit (HOSPITAL_COMMUNITY): Payer: Medicare Other

## 2015-11-28 DIAGNOSIS — I5032 Chronic diastolic (congestive) heart failure: Secondary | ICD-10-CM | POA: Diagnosis not present

## 2015-11-28 DIAGNOSIS — N186 End stage renal disease: Secondary | ICD-10-CM

## 2015-11-28 DIAGNOSIS — Z992 Dependence on renal dialysis: Secondary | ICD-10-CM

## 2015-11-28 DIAGNOSIS — N189 Chronic kidney disease, unspecified: Secondary | ICD-10-CM | POA: Diagnosis not present

## 2015-11-28 DIAGNOSIS — F0390 Unspecified dementia without behavioral disturbance: Secondary | ICD-10-CM | POA: Diagnosis not present

## 2015-11-28 DIAGNOSIS — R001 Bradycardia, unspecified: Secondary | ICD-10-CM | POA: Diagnosis not present

## 2015-11-28 LAB — CBC
HCT: 29.4 % — ABNORMAL LOW (ref 39.0–52.0)
Hemoglobin: 9.5 g/dL — ABNORMAL LOW (ref 13.0–17.0)
MCH: 31.5 pg (ref 26.0–34.0)
MCHC: 32.3 g/dL (ref 30.0–36.0)
MCV: 97.4 fL (ref 78.0–100.0)
PLATELETS: 93 10*3/uL — AB (ref 150–400)
RBC: 3.02 MIL/uL — AB (ref 4.22–5.81)
RDW: 15.6 % — AB (ref 11.5–15.5)
WBC: 3.7 10*3/uL — AB (ref 4.0–10.5)

## 2015-11-28 LAB — RENAL FUNCTION PANEL
ALBUMIN: 3.2 g/dL — AB (ref 3.5–5.0)
Anion gap: 11 (ref 5–15)
BUN: 44 mg/dL — AB (ref 6–20)
CALCIUM: 8.4 mg/dL — AB (ref 8.9–10.3)
CO2: 25 mmol/L (ref 22–32)
CREATININE: 8.12 mg/dL — AB (ref 0.61–1.24)
Chloride: 97 mmol/L — ABNORMAL LOW (ref 101–111)
GFR calc Af Amer: 6 mL/min — ABNORMAL LOW (ref >60)
GFR, EST NON AFRICAN AMERICAN: 5 mL/min — AB (ref >60)
GLUCOSE: 248 mg/dL — AB (ref 65–99)
PHOSPHORUS: 4 mg/dL (ref 2.5–4.6)
POTASSIUM: 4.4 mmol/L (ref 3.5–5.1)
SODIUM: 133 mmol/L — AB (ref 135–145)

## 2015-11-28 LAB — BASIC METABOLIC PANEL
Anion gap: 11 (ref 5–15)
BUN: 40 mg/dL — ABNORMAL HIGH (ref 6–20)
CHLORIDE: 100 mmol/L — AB (ref 101–111)
CO2: 26 mmol/L (ref 22–32)
CREATININE: 7.8 mg/dL — AB (ref 0.61–1.24)
Calcium: 8.3 mg/dL — ABNORMAL LOW (ref 8.9–10.3)
GFR, EST AFRICAN AMERICAN: 7 mL/min — AB (ref >60)
GFR, EST NON AFRICAN AMERICAN: 6 mL/min — AB (ref >60)
Glucose, Bld: 159 mg/dL — ABNORMAL HIGH (ref 65–99)
POTASSIUM: 4.1 mmol/L (ref 3.5–5.1)
SODIUM: 137 mmol/L (ref 135–145)

## 2015-11-28 LAB — GLUCOSE, CAPILLARY
GLUCOSE-CAPILLARY: 152 mg/dL — AB (ref 65–99)
Glucose-Capillary: 138 mg/dL — ABNORMAL HIGH (ref 65–99)

## 2015-11-28 MED ORDER — INSULIN ASPART 100 UNIT/ML ~~LOC~~ SOLN
0.0000 [IU] | Freq: Three times a day (TID) | SUBCUTANEOUS | Status: DC
  Administered 2015-11-28: 2 [IU] via SUBCUTANEOUS

## 2015-11-28 MED ORDER — LIDOCAINE-PRILOCAINE 2.5-2.5 % EX CREA: 1.0000 "application " | TOPICAL_CREAM | CUTANEOUS | Status: DC | PRN

## 2015-11-28 MED ORDER — LIDOCAINE HCL (PF) 1 % IJ SOLN: 5.0000 mL | INTRAMUSCULAR | Status: DC | PRN

## 2015-11-28 MED ORDER — ALTEPLASE 2 MG IJ SOLR: 2.0000 mg | Freq: Once | INTRAMUSCULAR | Status: DC | PRN

## 2015-11-28 MED ORDER — DARBEPOETIN ALFA 150 MCG/0.3ML IJ SOSY
PREFILLED_SYRINGE | INTRAMUSCULAR | Status: AC
  Administered 2015-11-28: 150 ug via INTRAVENOUS
  Filled 2015-11-28: qty 0.3

## 2015-11-28 MED ORDER — SODIUM CHLORIDE 0.9 % IV SOLN: 100.0000 mL | INTRAVENOUS | Status: DC | PRN

## 2015-11-28 MED ORDER — HEPARIN SODIUM (PORCINE) 1000 UNIT/ML DIALYSIS: 1000.0000 [IU] | INTRAMUSCULAR | Status: DC | PRN

## 2015-11-28 MED ORDER — PENTAFLUOROPROP-TETRAFLUOROETH EX AERO
1.0000 | INHALATION_SPRAY | CUTANEOUS | Status: DC | PRN
Start: 2015-11-28 — End: 2015-11-28

## 2015-11-28 MED ORDER — DARBEPOETIN ALFA 150 MCG/0.3ML IJ SOSY
150.0000 ug | PREFILLED_SYRINGE | INTRAMUSCULAR | Status: DC
  Administered 2015-11-28: 150 ug via INTRAVENOUS
  Filled 2015-11-28: qty 0.3

## 2015-11-28 NOTE — Progress Notes (Signed)
Inpatient Diabetes Program Recommendations  AACE/ADA: New Consensus Statement on Inpatient Glycemic Control (2015)  Target Ranges:  Prepandial:   less than 140 mg/dL      Peak postprandial:   less than 180 mg/dL (1-2 hours)      Critically ill patients:  140 - 180 mg/dL   Review of Glycemic Control:  Results for DUWAYNE, MATTERS (MRN [id number]) as of 11/28/2015 11:55  Ref. Range 11/27/2015 11:36 11/27/2015 13:17 11/27/2015 15:30 11/27/2015 15:58 11/27/2015 16:19 11/27/2015 20:54 11/28/2015 06:12  Glucose-Capillary Latest Ref Range: 65-99 mg/dL 381 (H) 91 52 (L) 60 (L) 93 267 (H) 138 (H)    Inpatient Diabetes Program Recommendations:  Consider reducing Novolog correction to sensitive tid with meals.  Thanks,  Beryl Meager, RN, BC-ADM Inpatient Diabetes Coordinator Pager [phone] (8a-5p)

## 2015-11-28 NOTE — Progress Notes (Signed)
Pt in stable condition, went over discharge instructions with pt, pt verbalis ed understanding, pt belongings at bedside, iv removed, cardiac monitor dc, ccmd notified.

## 2015-11-28 NOTE — Progress Notes (Signed)
  [facility] Progress Note   Subjective: TDC placed yesterday, no new complaints  Filed Vitals:   11/28/15 1333 11/28/15 1344 11/28/15 1358 11/28/15 1430  BP: 142/69 148/72 141/70 124/64  Pulse: 72 77 75 75  Temp:      TempSrc:      Resp:  13 12 12   Height:      Weight:      SpO2:        Inpatient medications: . calcitRIOL  0.75 mcg Oral Q M,W,F-HD  . cinacalcet  60 mg Oral Q supper  . colchicine  0.3 mg Oral Q M,W,F-2000  . darbepoetin (ARANESP) injection - DIALYSIS  150 mcg Intravenous Q Fri-HD  . feeding supplement (NEPRO CARB STEADY)  237 mL Oral BID BM  . folic acid  1 mg Oral Daily  . insulin aspart  0-5 Units Subcutaneous QHS  . insulin aspart  0-9 Units Subcutaneous TID WC  . pantoprazole  40 mg Oral BID   . sodium chloride Stopped (11/27/15 1515)   sodium chloride, sodium chloride, acetaminophen **OR** acetaminophen, alteplase, docusate sodium, heparin, lidocaine, lidocaine (PF), lidocaine-prilocaine, oxyCODONE-acetaminophen, pentafluoroprop-tetrafluoroeth, traMADol, zolpidem  Exam: Alert, clear, frustrated No jvd Chest clear bilat RRR no mrg Abd soft ntnd no mass or ascite LUE 2+ edema, +bruit over AVG No LE edema Neuro nonfocal, alert  Dialysis: [facility] M[facility]   3h 11/29/15 89.5kg 2/2.25 bath Prof 4 LUA AVG Hep none Mircera: 200 mcg IV q 2 weeks ( last dose 11/12/14, last HGB 10.6 11/19/15) Venofer: 50 mg IV weekly (last dose recently restarted-give today) Calcitriol: 0.75 mcg PO Q MWF        Assessment: 1 Bradycardia - resolved w Rx of hyperkalemia 2 LUE swelling - unable to stick AVG reliably.  The left central vein is 100% occluded. No further use of AVG, has new TDC which we will use today at HD. If stable after HD will be ok for dc home today.  3 HTN cont norvasc 4 Volume is 2Kg + 5 Anemia recent esa dose 6 MBD cont meds 7 DM per primary 8 ESRD HD MWF  Plan - ok for dc after HD today   11/21/15 MD [facility] pager [phone]    cell [phone] 11/28/2015, 2:36 PM    Recent Labs Lab 11/27/15 0856 11/28/15 0417 11/28/15 1329  NA 137 137 133*  K 4.3 4.1 4.4  CL 99* 100* 97*  CO2 27 26 25   GLUCOSE 245* 159* 248*  BUN 31* 40* 44*  CREATININE 6.45* 7.80* 8.12*  CALCIUM 8.4* 8.3* 8.4*  PHOS  --   --  4.0    Recent Labs Lab 11/28/15 1329  ALBUMIN 3.2*    Recent Labs Lab 11/26/15 1412 11/26/15 1421 11/27/15 0856 11/28/15 0417  WBC 4.6  --  3.2* 3.7*  NEUTROABS 2.9  --   --   --   HGB 9.9* 10.9* 9.8* 9.5*  HCT 31.5* 32.0* 30.8* 29.4*  MCV 100.3*  --  99.7 97.4  PLT 92*  --  93* 93*

## 2015-11-28 NOTE — Progress Notes (Signed)
   VASCULAR SURGERY ASSESSMENT & PLAN:  * 1 Day Post-Op s/p: Placement of tunneled dialysis catheter.  *  Would let left arm rest and continue arm elevation. If this point I would not recommend ligation of his graft in the left upper arm. He's had previous access in the right arm which failed and he is not a candidate for a fistula. Therefore I think we should give this every chance. I can follow him up as an outpatient once he is discharged to follow the left arm graft.  * Vascular will be available as needed during this admission. I will arrange follow up in approximately 3-4 weeks.  SUBJECTIVE: No specific complaints.  PHYSICAL EXAM: Filed Vitals:   11/27/15 1648 11/27/15 2058 11/28/15 0247 11/28/15 0935  BP: 121/40 154/66 144/61 142/52  Pulse: 76 81 74 71  Temp: 98.3 F (36.8 C) 97.9 F (36.6 C) 98.7 F (37.1 C) 97.8 F (36.6 C)  TempSrc: Axillary Oral Oral Oral  Resp: 16 18 16 18   Height:      Weight:   195 lb 8.8 oz (88.7 kg)   SpO2:  100% 99% 100%   Catheter site looks fine. Thrill in left upper arm graft. Moderate left upper extremity swelling.  LABS: Lab Results  Component Value Date   WBC 3.7* 11/28/2015   HGB 9.5* 11/28/2015   HCT 29.4* 11/28/2015   MCV 97.4 11/28/2015   PLT 93* 11/28/2015   Lab Results  Component Value Date   CREATININE 7.80* 11/28/2015   Lab Results  Component Value Date   INR 1.16 09/15/2015   CBG (last 3)   Recent Labs  11/27/15 1619 11/27/15 2054 11/28/15 0612  GLUCAP 93 267* 138*    Principal Problem:   Bradycardia Active Problems:   Hypertension   Anemia in chronic kidney disease   Diabetes type 2, controlled (HCC)   ESRD on dialysis (HCC)   Secondary hyperparathyroidism, renal (HCC)   Seizure disorder (HCC)   Hyperkalemia   Dementia   Chronic diastolic congestive heart failure (HCC)    11/30/15 BeeperCari Caraway 11/28/2015

## 2015-11-28 NOTE — Discharge Summary (Signed)
Physician Discharge Summary   Patient ID: [name] MRN: [id number] DOB/AGE: October 25, 1931 82 y.o.  Admit date: 11/26/2015 Discharge date: 11/28/2015  Primary Care Physician:  [name], DO  Discharge Diagnoses:   . Bradycardia . Hypertension . Anemia in chronic kidney disease . Secondary hyperparathyroidism, renal (HCC) . Hyperkalemia . Chronic diastolic congestive heart failure (HCC) . Dementia  Consults: Nephrology Vascular surgery  Recommendations for Outpatient Follow-up:  1. Follow-up outpatient with vascular surgery in 3-4 weeks 2. Please repeat CBC/BMET at next visit   DIET: Renal diet    Allergies:   Allergies  Allergen Reactions  . Aspirin Other (See Comments)    Bleeding   . Lactose Intolerance (Gi) Diarrhea     DISCHARGE MEDICATIONS: Current Discharge Medication List    CONTINUE these medications which have NOT CHANGED   Details  calcitRIOL (ROCALTROL) 0.5 MCG capsule Take 2 capsules (1 mcg total) by mouth every Monday, Wednesday, and Friday with hemodialysis.    calcium acetate (PHOSLO) 667 MG capsule Take 3 capsules by mouth three times a day Qty: 180 capsule, Refills: 3    cinacalcet (SENSIPAR) 30 MG tablet Take 1 tablet (30 mg total) by mouth daily with supper.    colchicine 0.6 MG tablet Take 1 tablet (0.6 mg total) by mouth as needed. For gout. Patient needs an appointment before anymore refills. Qty: 30 tablet, Refills: 0    darbepoetin (ARANESP) 60 MCG/0.3ML SOLN injection Inject 0.3 mLs (60 mcg total) into the vein every Thursday with hemodialysis. Qty: 4.2 mL    Diphenhydramine-APAP 25-500 MG TABS Take 1 tablet by mouth at bedtime as needed (sleep). For sleep    docusate sodium (COLACE) 100 MG capsule Take 1 capsule (100 mg total) by mouth 2 (two) times daily. Qty: 60 capsule, Refills: 0    folic acid (FOLVITE) 1 MG tablet Take 1 tablet (1 mg total) by mouth daily. Qty: 30 tablet, Refills: 0    HUMALOG KWIKPEN 100 UNIT/ML  KiwkPen Inject 0-10 Units into the skin as needed (prn based on blood sugar reading). Sliding Scale Refills: 0    lacosamide (VIMPAT) 50 MG TABS tablet 150mg  BID on M-W-F (dialysis days); 100mg  BID on Tue-Thu-Sat-Sun (non-dialysis days) Qty: 180 tablet, Refills: 5    lidocaine (LIDODERM) 5 % Place 1 patch onto the skin daily. Remove & Discard patch within 12 hours or as directed by MD Qty: 30 patch, Refills: 0    multivitamin (RENA-VIT) TABS tablet Take 1 tablet by mouth at bedtime. Qty: 30 tablet, Refills: 0    Nutritional Supplements (FEEDING SUPPLEMENT, NEPRO CARB STEADY,) LIQD Take 237 mLs by mouth 2 (two) times daily between meals.    pantoprazole (PROTONIX) 40 MG tablet Take 1 tablet (40 mg total) by mouth 2 (two) times daily. Qty: 60 tablet, Refills: 0    sodium chloride 0.9 % SOLN 100 mL with ferric gluconate 12.5 MG/ML SOLN 62.5 mg Inject 62.5 mg into the vein every Thursday with hemodialysis.    traMADol (ULTRAM) 50 MG tablet Take one tablet by mouth every 6 hours as needed for pain. Patient needs appointment before anymore refills. Qty: 120 tablet, Refills: 0    gabapentin (NEURONTIN) 100 MG capsule Take 1 capsule (100 mg total) by mouth daily. Qty: 30 capsule, Refills: 0   Associated Diagnoses: Degenerative disc disease, lumbar      STOP taking these medications     amLODipine (NORVASC) 10 MG tablet      cefTAZidime 2 g in dextrose 5 % 50  mL          Brief H and P: For complete details please refer to admission H and P, but in brief 82 y.o. male With chronic kidney disease on dialysis, seizure disorder, traumatic subdural hematoma, diabetes on insulin, hypertension, anemia presented from dialysis Center due to bradycardia. Patient had Junctional rhythm Borderline prolonged QT interval, with a heart rate range 33-42 on EKG. Patient was at dialysis where they were initially unable to access his graft. He was sent to [facility] to be evaluated by our for  declotting. They found he did not require to be declotted there was an area for access but he was found to be bradycardic with a heart rate in the 30s. Reportedly he could not sit up due to lightheadedness. In ED, afebrile, heart rate 42, not hypoxic.  Hospital Course:  Sinus Bradycardia - Records indicate pattern of bradycardia with hyperkalemia, was evaluated by EP cardiology, Dr Elberta Fortis in December (note 08/26/15), cardiology recommended to avoid pacemaker placement if possible due to high risk of infection and HD needs. - Patient was noted to have potassium 5.6 at the time of admission.  - Patient's bradycardia resolved after dialysis. I did call cardiology consult this am if pacemaker would be indicated this time as this is becoming a recurrent issue even though it seems to be due to electrolyte abnormalities. However cardiology indicated that patient's bradycardia is due to reversible electrolyte abn/hyperkalemia. 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  Patient underwent placement of tunneled dialysis catheter   End-stage renal disease on dialysis, MWF with access difficulties - Patient had hemodialysis Wed per his schedule while inpatient . - Vascular surgery also consulted for access, patient underwent tunneled dialysis catheter placement by vascular surgery, Dr. Edilia Bo.  Per vascular surgery, at this point would not recommend ligation of his graft in the left upper arm, he is not a candidate for fistula, recommended follow-up for the left arm graft, in 3-4 weeks.  - Per renal, Dr Arta Silence, okay to dc home today after HD  Hypertension. Controlled. Home medications include amlodipine - Currently BP stable, holding Norvasc  Diabetes mellitus type II, uncontrolled with chronic kidney disease -Patient was placed on sliding scale insulin while  inpatient  Hyperkalemia Resolved after hemodialysis  Chronic diastolic heart failure. Appears compensated. EF 55% 2014 with grade 1 diastolic dysfunction - volume management with hemodialysis  Dementia. Stable at baseline  anemia. Related to chronic disease. Stable at baseline  Day of Discharge BP 141/70 mmHg  Pulse 75  Temp(Src) 97.9 F (36.6 C) (Oral)  Resp 12  Ht 6' (1.829 m)  Wt 91.1 kg (200 lb 13.4 oz)  BMI 27.23 kg/m2  SpO2 99%  Physical Exam: General: Alert and awake oriented x3 not in any acute distress. HEENT: anicteric sclera, pupils reactive to light and accommodation CVS: S1-S2 clear no murmur rubs or gallops Chest: clear to auscultation bilaterally, no wheezing rales or rhonchi Abdomen: soft nontender, nondistended, normal bowel sounds Extremities: no cyanosis, clubbing or edema noted bilaterally Neuro: Cranial nerves II-XII intact, no focal neurological deficits   The results of significant diagnostics from this hospitalization (including imaging, microbiology, ancillary and laboratory) are listed below for reference.    LAB RESULTS: Basic Metabolic Panel:  Recent Labs Lab 11/27/15 0856 11/28/15 0417  NA 137 137  K 4.3 4.1  CL 99* 100*  CO2 27 26  GLUCOSE  245* 159*  BUN 31* 40*  CREATININE 6.45* 7.80*  CALCIUM 8.4* 8.3*   Liver Function Tests: No results for input(s): AST, ALT, ALKPHOS, BILITOT, PROT, ALBUMIN in the last 168 hours. No results for input(s): LIPASE, AMYLASE in the last 168 hours. No results for input(s): AMMONIA in the last 168 hours. CBC:  Recent Labs Lab 11/26/15 1412  11/27/15 0856 11/28/15 0417  WBC 4.6  --  3.2* 3.7*  NEUTROABS 2.9  --   --   --   HGB 9.9*  < > 9.8* 9.5*  HCT 31.5*  < > 30.8* 29.4*  MCV 100.3*  --  99.7 97.4  PLT 92*  --  93* 93*  < > = values in this interval not displayed. Cardiac Enzymes:  Recent Labs Lab 11/26/15 1412  TROPONINI 0.06*   BNP: Invalid input(s): POCBNP CBG:  Recent  Labs Lab 11/27/15 2054 11/28/15 0612  GLUCAP 267* 138*    Significant Diagnostic Studies:  Dg Chest Port 1 View  11/27/2015  CLINICAL DATA:  Status post dialysis catheter placement EXAM: PORTABLE CHEST 1 VIEW COMPARISON:  11/26/2015 FINDINGS: Cardiac shadow is stable. The lungs are clear. No infiltrate or pneumothorax is seen. A dialysis catheter is noted in satisfactory position. IMPRESSION: No acute abnormality.  No pneumothorax following catheter placement. Electronically Signed   By: Alcide Clever M.D.   On: 11/27/2015 15:59   Dg Chest Portable 1 View  11/26/2015  CLINICAL DATA:  Weakness EXAM: PORTABLE CHEST 1 VIEW COMPARISON:  10/23/2015 FINDINGS: Cardiomediastinal silhouette is stable. No acute infiltrate or pleural effusion. No pulmonary edema. Bony thorax is unremarkable. IMPRESSION: No active disease. Electronically Signed   By: Natasha Mead M.D.   On: 11/26/2015 14:09   Dg Fluoro Guide Cv Line-no Report  11/27/2015  CLINICAL DATA:  FLOURO GUIDE CV LINE Fluoroscopy was utilized by the requesting physician.  No radiographic interpretation.    2D ECHO:   Disposition and Follow-up:    DISPOSITION: home    DISCHARGE FOLLOW-UP Follow-up Information    Follow up with [name], [name], DO. Schedule an appointment as soon as possible for a visit in 2 weeks.   Specialty:  Geriatric Medicine   Why:  for hospital follow-up   Contact information:   [address] [place] [address] [phone]       Follow up with Waverly Ferrari, MD. Schedule an appointment as soon as possible for a visit in 2 weeks.   Specialties:  Vascular Surgery, Cardiology   Why:  for hospital follow-up   Contact information:   [address] [place] [address] [phone]        Time spent on Discharge: 25 mins   Signed:   Damarys Speir M.D. [facility] 11/28/2015, 2:20 PM Pager: [phone]

## 2015-12-01 ENCOUNTER — Telehealth: Payer: Self-pay

## 2015-12-01 NOTE — Telephone Encounter (Signed)
First available appointment is 12-22-15, would you be willing to make an exception and see him at 4:00 pm on Thursday 12-04-15 or 11:30 am this Friday 12-05-15

## 2015-12-01 NOTE — Telephone Encounter (Signed)
Spoke with patient, patient ok to be seen this Friday.

## 2015-12-01 NOTE — Telephone Encounter (Signed)
He needs an appointment for multiple hospital follow ups.

## 2015-12-01 NOTE — Telephone Encounter (Signed)
Patient called because he was recently seen at [facility] and he has developed a pattern or going to dialysis and ending up in the hospital. Patient states he has no idea why they keep sending him to the hospital. Patient states he was told that he may not need dialysis as often as he is currently getting it (3 x weekly). Patient was told in the hospital he may only need dialysis 2 x weekly. Patient states he has to go to the hospital most of the time because the workers at the dialysis center can not find a port.   Patient is requesting that Dr.[name] call him, patient states no one will talk to him.

## 2015-12-01 NOTE — Telephone Encounter (Signed)
1130 Friday

## 2015-12-02 DIAGNOSIS — E1122 Type 2 diabetes mellitus with diabetic chronic kidney disease: Secondary | ICD-10-CM | POA: Diagnosis not present

## 2015-12-02 DIAGNOSIS — S065X0S Traumatic subdural hemorrhage without loss of consciousness, sequela: Secondary | ICD-10-CM | POA: Diagnosis not present

## 2015-12-02 DIAGNOSIS — R001 Bradycardia, unspecified: Secondary | ICD-10-CM | POA: Diagnosis not present

## 2015-12-02 DIAGNOSIS — D631 Anemia in chronic kidney disease: Secondary | ICD-10-CM | POA: Diagnosis not present

## 2015-12-02 DIAGNOSIS — N186 End stage renal disease: Secondary | ICD-10-CM | POA: Diagnosis not present

## 2015-12-02 DIAGNOSIS — M6281 Muscle weakness (generalized): Secondary | ICD-10-CM | POA: Diagnosis not present

## 2015-12-02 DIAGNOSIS — I440 Atrioventricular block, first degree: Secondary | ICD-10-CM | POA: Diagnosis not present

## 2015-12-02 DIAGNOSIS — E1140 Type 2 diabetes mellitus with diabetic neuropathy, unspecified: Secondary | ICD-10-CM | POA: Diagnosis not present

## 2015-12-02 DIAGNOSIS — I272 Other secondary pulmonary hypertension: Secondary | ICD-10-CM | POA: Diagnosis not present

## 2015-12-02 DIAGNOSIS — I5030 Unspecified diastolic (congestive) heart failure: Secondary | ICD-10-CM | POA: Diagnosis not present

## 2015-12-02 DIAGNOSIS — I132 Hypertensive heart and chronic kidney disease with heart failure and with stage 5 chronic kidney disease, or end stage renal disease: Secondary | ICD-10-CM | POA: Diagnosis not present

## 2015-12-03 ENCOUNTER — Telehealth: Payer: Self-pay | Admitting: Vascular Surgery

## 2015-12-03 ENCOUNTER — Telehealth: Payer: Self-pay | Admitting: *Deleted

## 2015-12-03 DIAGNOSIS — I132 Hypertensive heart and chronic kidney disease with heart failure and with stage 5 chronic kidney disease, or end stage renal disease: Secondary | ICD-10-CM | POA: Diagnosis not present

## 2015-12-03 DIAGNOSIS — A4152 Sepsis due to Pseudomonas: Secondary | ICD-10-CM | POA: Diagnosis not present

## 2015-12-03 DIAGNOSIS — M6281 Muscle weakness (generalized): Secondary | ICD-10-CM | POA: Diagnosis not present

## 2015-12-03 DIAGNOSIS — D631 Anemia in chronic kidney disease: Secondary | ICD-10-CM | POA: Diagnosis not present

## 2015-12-03 DIAGNOSIS — I5030 Unspecified diastolic (congestive) heart failure: Secondary | ICD-10-CM | POA: Diagnosis not present

## 2015-12-03 DIAGNOSIS — E1122 Type 2 diabetes mellitus with diabetic chronic kidney disease: Secondary | ICD-10-CM | POA: Diagnosis not present

## 2015-12-03 DIAGNOSIS — N186 End stage renal disease: Secondary | ICD-10-CM | POA: Diagnosis not present

## 2015-12-03 DIAGNOSIS — D509 Iron deficiency anemia, unspecified: Secondary | ICD-10-CM | POA: Diagnosis not present

## 2015-12-03 DIAGNOSIS — R001 Bradycardia, unspecified: Secondary | ICD-10-CM | POA: Diagnosis not present

## 2015-12-03 NOTE — Telephone Encounter (Signed)
-----   Message from Chuck Hint, MD sent at 11/28/2015 11:54 AM EDT ----- Regarding: f/u This patient needs an appointment with me in a proximally 4 weeks to check on his left arm swelling and left arm graft. Thank you. CD

## 2015-12-03 NOTE — Telephone Encounter (Signed)
Spoke with patient re: Vimpat. He stated he has been taking it, has had no problem getting from pharmacy. Called patient due to fax received from [facility] requesting PA for medication. PA started 12/03/15, no outcome at this time.

## 2015-12-03 NOTE — Telephone Encounter (Signed)
Spoke with pt, dpm °

## 2015-12-04 DIAGNOSIS — E1122 Type 2 diabetes mellitus with diabetic chronic kidney disease: Secondary | ICD-10-CM | POA: Diagnosis not present

## 2015-12-04 DIAGNOSIS — I5030 Unspecified diastolic (congestive) heart failure: Secondary | ICD-10-CM | POA: Diagnosis not present

## 2015-12-04 DIAGNOSIS — D631 Anemia in chronic kidney disease: Secondary | ICD-10-CM | POA: Diagnosis not present

## 2015-12-04 DIAGNOSIS — N186 End stage renal disease: Secondary | ICD-10-CM | POA: Diagnosis not present

## 2015-12-04 DIAGNOSIS — I440 Atrioventricular block, first degree: Secondary | ICD-10-CM | POA: Diagnosis not present

## 2015-12-04 DIAGNOSIS — I132 Hypertensive heart and chronic kidney disease with heart failure and with stage 5 chronic kidney disease, or end stage renal disease: Secondary | ICD-10-CM | POA: Diagnosis not present

## 2015-12-04 DIAGNOSIS — I272 Other secondary pulmonary hypertension: Secondary | ICD-10-CM | POA: Diagnosis not present

## 2015-12-04 DIAGNOSIS — R001 Bradycardia, unspecified: Secondary | ICD-10-CM | POA: Diagnosis not present

## 2015-12-04 DIAGNOSIS — E1140 Type 2 diabetes mellitus with diabetic neuropathy, unspecified: Secondary | ICD-10-CM | POA: Diagnosis not present

## 2015-12-04 DIAGNOSIS — M6281 Muscle weakness (generalized): Secondary | ICD-10-CM | POA: Diagnosis not present

## 2015-12-04 DIAGNOSIS — S065X0S Traumatic subdural hemorrhage without loss of consciousness, sequela: Secondary | ICD-10-CM | POA: Diagnosis not present

## 2015-12-05 ENCOUNTER — Encounter: Payer: Self-pay | Admitting: Internal Medicine

## 2015-12-05 ENCOUNTER — Ambulatory Visit (INDEPENDENT_AMBULATORY_CARE_PROVIDER_SITE_OTHER): Payer: Medicare Other | Admitting: Internal Medicine

## 2015-12-05 VITALS — BP 146/68 | HR 67 | Temp 97.5°F | Ht 72.0 in | Wt 199.0 lb

## 2015-12-05 DIAGNOSIS — I5032 Chronic diastolic (congestive) heart failure: Secondary | ICD-10-CM

## 2015-12-05 DIAGNOSIS — I6200 Nontraumatic subdural hemorrhage, unspecified: Secondary | ICD-10-CM | POA: Diagnosis not present

## 2015-12-05 DIAGNOSIS — D509 Iron deficiency anemia, unspecified: Secondary | ICD-10-CM | POA: Diagnosis not present

## 2015-12-05 DIAGNOSIS — Z992 Dependence on renal dialysis: Secondary | ICD-10-CM | POA: Diagnosis not present

## 2015-12-05 DIAGNOSIS — N186 End stage renal disease: Secondary | ICD-10-CM | POA: Diagnosis not present

## 2015-12-05 DIAGNOSIS — A4152 Sepsis due to Pseudomonas: Secondary | ICD-10-CM | POA: Diagnosis not present

## 2015-12-05 DIAGNOSIS — D631 Anemia in chronic kidney disease: Secondary | ICD-10-CM | POA: Diagnosis not present

## 2015-12-05 DIAGNOSIS — E1121 Type 2 diabetes mellitus with diabetic nephropathy: Secondary | ICD-10-CM | POA: Diagnosis not present

## 2015-12-05 DIAGNOSIS — M545 Low back pain, unspecified: Secondary | ICD-10-CM

## 2015-12-05 DIAGNOSIS — S065XAA Traumatic subdural hemorrhage with loss of consciousness status unknown, initial encounter: Secondary | ICD-10-CM

## 2015-12-05 DIAGNOSIS — M7989 Other specified soft tissue disorders: Secondary | ICD-10-CM | POA: Diagnosis not present

## 2015-12-05 DIAGNOSIS — G40909 Epilepsy, unspecified, not intractable, without status epilepticus: Secondary | ICD-10-CM

## 2015-12-05 DIAGNOSIS — I120 Hypertensive chronic kidney disease with stage 5 chronic kidney disease or end stage renal disease: Secondary | ICD-10-CM | POA: Diagnosis not present

## 2015-12-05 DIAGNOSIS — R001 Bradycardia, unspecified: Secondary | ICD-10-CM | POA: Diagnosis not present

## 2015-12-05 DIAGNOSIS — S065X9A Traumatic subdural hemorrhage with loss of consciousness of unspecified duration, initial encounter: Secondary | ICD-10-CM

## 2015-12-05 MED ORDER — BUPRENORPHINE 5 MCG/HR TD PTWK: 5.0000 ug | MEDICATED_PATCH | TRANSDERMAL | Status: DC

## 2015-12-05 MED ORDER — HUMALOG KWIKPEN 100 UNIT/ML ~~LOC~~ SOPN
0.0000 [IU] | PEN_INJECTOR | SUBCUTANEOUS | Status: DC | PRN
Start: 2015-12-05 — End: 2015-12-22

## 2015-12-05 NOTE — Patient Instructions (Signed)
New pain patch  Take vimpat twice a day  Follow up with vascular Dr. Edilia Bo about your left arm.  We're going to see if his office can see you sooner about your arm.

## 2015-12-05 NOTE — Progress Notes (Signed)
Patient ID: [name], male   DOB: 21-Jan-1932, 82 y.o.   MRN: [id number]   Location:  [facility] Provider:  Naquisha Whitehair L. Renato Gails, D.O., C.M.D.  Code Status: DNR Goals of Care:  Advanced Directives 11/26/2015  Does patient have an advance directive? Yes  Type of Advance Directive Healthcare Power of Attorney  Copy of advanced directive(s) in chart? Yes   Chief Complaint  Patient presents with  . Hospitalization Follow-up    Hospital follow-up, Ed to admission on 11/26/15. Patient would like to discuss dialysis.     HPI: Patient is a 82 y.o. male seen today for f/u from multiple hospital and ED visits.  Most recent was 11/26/15 Pt is frustrated that he keeps getting sent to the hospital from dialysis at the [facility] center.  Feels like nothing really happens when he's there.  He told his doctors that he only needs 2 days of dialysis per week instead of three.  He had been urinating regularly at the hospital.  Creatinine on 11/28/15 was 7.8.  Doctor comes wednesdays. Talks about going to vein and vascular and going to the hospital.  Was having access difficulty.     12/7, saw Dr. Darrick Penna:  Left arm AV graft scheduled for 12/20.   12/30, went to ED with seizure.  Had labs and CT head w/o contrast.  He reports people at HD center cause him to have the seizures.  CT showed that the bleeding resolved (left subdural).  Sometimes he had too much water drawn off of him and he was shaking and trembling afterwards.  1/3 he had his left AV gore-tex graft.    1/4 he was back in the ED with hyperkalemia.    1/5 he called here requesting pain medication for his back.  We sent in tramadol.    1/9 [name] called b/c not eating, shaking real bad and cannot urinate.  We advised him to go to the ED.  Hyperkalemic again.  Was given lidocaine and tramadol for his pain.  Lidocaine patch rolls up his back.  Does not help much.  2/16-20 back to hospital with altered mental status.  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  No conduction abnormality was noted by EP.  He had diastolic heart failure grade 1 (2014).  His blood cultures grew out 2/2 pseudomonas.  Pt noted that he'd had pain at his right chest dialysis catheter site.  He was treated with ceftazidine for 2 wks.  He had a negative echo for endocarditis.  He was continued on vimpat  po bid and  on m/w/f.  Amlodipine was reduced to .  They felt tramadol was a poor option for his pain.  Tylenol beforehand was recommended (before HD).    3/22, he was back to the hospital with junctional bradycardia.  His graft could not be accessed at HD.  He was sent to the hospital for declotting of the graft.  Left subclavian vein was 100% occluded so vascular surgery was consulted for HD cath placement which he underwent.  He is to f/u with Dr. Edilia Bo 4 weeks from 11/28/15 to check on his left arm swelling and graft  His wife reports he does not have much room for his walker in the halls and restrooms.  He also goes too quickly.  He reports he is taking his vimpat as directed.  He takes 150 on HD days and 100 on non-HD days. No  problems since.    Takes his meds from the bottles.  Did try pillboxes but didn't help, he says.  His daughter-in-law did want to help but he wasn't happy with it.    Past Medical History  Diagnosis Date  . Hypertension   . Diabetes mellitus without complication (HCC)   . Arthritis   . History of blood transfusion   . GI bleed   . ESRD (end stage renal disease) (HCC)   . Seizure (HCC)   . Dialysis patient (HCC)   . Subdural hematoma (HCC) 05/2015    from fall  . Sleep apnea     wears CPAP n/c  . Pulmonary hypertension (HCC)     severe pulmonary HTN by 01/14/13 echo    Past Surgical History  Procedure Laterality Date  . Esophagogastroduodenoscopy  10/03/2012    Procedure: ESOPHAGOGASTRODUODENOSCOPY (EGD);  Surgeon: Theda Belfast, MD;  Location[facility]  ENDOSCOPY;  Service: Endoscopy;  Laterality: N/A;  . Colonoscopy  10/04/2012    Procedure: COLONOSCOPY;  Surgeon: Theda Belfast, MD;  Location: [facility] ENDOSCOPY;  Service: Endoscopy;  Laterality: N/A;  . Colonoscopy with esophagogastroduodenoscopy (egd) Left 11/30/2012    Procedure: COLONOSCOPY WITH ESOPHAGOGASTRODUODENOSCOPY (EGD);  Surgeon: Theda Belfast, MD;  Location: [facility] ENDOSCOPY;  Service: Endoscopy;  Laterality: Left;  . Spine surgery  2004    back fusion/ [facility] Penumalli,MD  . Retinal laser procedure Right 04/2015  . Av fistula placement Left 09/09/2015    Procedure: INSERTION OF ARTERIOVENOUS  GORE-TEX GRAFT Left  ARM;  Surgeon: Sherren Kerns, MD;  Location: [facility] OR;  Service: Vascular;  Laterality: Left;  . Insertion of dialysis catheter Right 11/27/2015    Procedure: INSERTION OF DIALYSIS CATHETER - Right Internal Jugular Placement;  Surgeon: Chuck Hint, MD;  Location: [facility] OR;  Service: Vascular;  Laterality: Right;    Allergies  Allergen Reactions  . Aspirin Other (See Comments)    Bleeding   . Lactose Intolerance (Gi) Diarrhea      Medication List       This list is accurate as of: 12/05/15 11:56 AM.  Always use your most recent med list.               calcitRIOL 0.5 MCG capsule  Commonly known as:  ROCALTROL  Take 2 capsules (1 mcg total) by mouth every Monday, Wednesday, and Friday with hemodialysis.     calcium acetate 667 MG capsule  Commonly known as:  PHOSLO  Take 3 capsules by mouth three times a day     cinacalcet 30 MG tablet  Commonly known as:  SENSIPAR  Take 1 tablet (30 mg total) by mouth daily with supper.     colchicine 0.6 MG tablet  Take 1 tablet (0.6 mg total) by mouth as needed. For gout. Patient needs an appointment before anymore refills.     darbepoetin 60 MCG/0.3ML Soln injection  Commonly known as:  ARANESP  Inject 0.3 mLs (60 mcg total) into the vein every Thursday with hemodialysis.     Diphenhydramine-APAP 25-500 MG Tabs    Take 1 tablet by mouth at bedtime as needed (sleep). For sleep     docusate sodium 100 MG capsule  Commonly known as:  COLACE  Take 100 mg by mouth daily as needed for mild constipation.     folic acid 1 MG tablet  Commonly known as:  FOLVITE  Take 1 tablet (1 mg total) by mouth daily.     gabapentin 100 MG  capsule  Commonly known as:  NEURONTIN  Take 1 capsule (100 mg total) by mouth daily.     HUMALOG KWIKPEN 100 UNIT/ML KiwkPen  Generic drug:  insulin lispro  Inject 0-10 Units into the skin as needed (prn based on blood sugar reading). Sliding Scale     lacosamide 50 MG Tabs tablet  Commonly known as:  VIMPAT  150mg  BID on M-W-F (dialysis days); 100mg  BID on Tue-Thu-Sat-Sun (non-dialysis days)     lidocaine 5 %  Commonly known as:  LIDODERM  Place 1 patch onto the skin daily. Remove & Discard patch within 12 hours or as directed by MD     multivitamin Tabs tablet  Take 1 tablet by mouth at bedtime.     pantoprazole 40 MG tablet  Commonly known as:  PROTONIX  Take 1 tablet (40 mg total) by mouth 2 (two) times daily.     sodium chloride flush 0.9 % Soln injection  With ferric gluconate 12.5 mg/ml solution 62.5 mg on Monday,Wednesday, and Friday with dialysis     traMADol 50 MG tablet  Commonly known as:  ULTRAM  Take one tablet by mouth every 6 hours as needed for pain. Patient needs appointment before anymore refills.       Review of Systems:  Review of Systems  Constitutional: Negative for fever, chills and malaise/fatigue.  HENT: Negative for congestion.   Respiratory: Negative for shortness of breath.   Cardiovascular: Negative for chest pain and leg swelling.       Left arm swelling  Gastrointestinal: Negative for abdominal pain, constipation, blood in stool and melena.  Genitourinary: Negative for dysuria.  Musculoskeletal: Positive for back pain and falls.  Skin: Negative for itching and rash.  Neurological: Positive for seizures and weakness. Negative  for dizziness and loss of consciousness.       No seizures since last hospitalization as in hpi  Endo/Heme/Allergies: Does not bruise/bleed easily.  Psychiatric/Behavioral: Positive for memory loss.    Health Maintenance  Topic Date Due  . OPHTHALMOLOGY EXAM  01/24/1944  . URINE MICROALBUMIN  01/24/1944  . TETANUS/TDAP  01/23/1953  . ZOSTAVAX  01/23/1994  . FOOT EXAM  02/13/2016  . INFLUENZA VACCINE  04/06/2016  . HEMOGLOBIN A1C  04/21/2016  . PNA vac Low Risk Adult (2 of 2 - PPSV23) 06/08/2016    Physical Exam: Filed Vitals:   12/05/15 1122  BP: 146/68  Pulse: 67  Temp: 97.5 F (36.4 C)  TempSrc: Oral  Height: 6' (1.829 m)  Weight: 199 lb (90.266 kg)  SpO2: 96%   Body mass index is 26.98 kg/(m^2). Physical Exam  Constitutional: He appears well-developed and well-nourished. No distress.  HENT:  Head: Normocephalic and atraumatic.  Cardiovascular: Normal rate, regular rhythm and normal heart sounds.   Right arm AV fistula appears not functional; right chest wall HD catheter in place w/o erythema, warmth or drainage; entire left arm notably swollen, nontender, warm and hard just proximal to his elbow  Pulmonary/Chest: Effort normal and breath sounds normal. No respiratory distress.  Abdominal: Soft. Bowel sounds are normal. He exhibits no distension. There is no tenderness.  Musculoskeletal:  Stooped posture, walks with walker  Neurological: He is alert.  Skin: Skin is warm and dry.    Labs reviewed: Basic Metabolic Panel:  Recent Labs  08/08/2016 2105  06/20/15 2318 06/21/15 1420  07/26/15 1210  09/16/15 0430  10/26/15 0555 10/27/15 1347  11/26/15 1825 11/27/15 0856 11/28/15 0417 11/28/15 1329  NA  --   < >  135  --   < > 134*  < > 136  < > 137 138  < >  --  137 137 133*  K  --   < > 4.4  --   < > 4.8  < > 6.0*  < > 3.9 3.9  < >  --  4.3 4.1 4.4  CL  --   < > 96*  --   < > 98*  < > 98*  < > 97* 96*  < >  --  99* 100* 97*  CO2  --   < > 27  --   < > 24  < >  20*  < > 29 26  --   --  27 26 25   GLUCOSE  --   < > 147*  --   < > 149*  < > 135*  < > 175* 197*  < >  --  245* 159* 248*  BUN  --   < > 38*  --   < > 75*  < > 140*  < > 37* 62*  < >  --  31* 40* 44*  CREATININE  --   < > 5.89*  --   < > 8.37*  < > 13.98*  < > 6.67* 9.19*  < >  --  6.45* 7.80* 8.12*  CALCIUM  --   < > 9.0  --   < > 9.0  < > 7.8*  < > 8.5* 8.8*  --   --  8.4* 8.3* 8.4*  MG 2.2  --  2.5*  --   --  3.3*  --   --   --   --   --   --   --   --   --   --   PHOS  --   < >  --   --   < >  --   < >  --   < > 4.0 5.3*  --   --   --   --  4.0  TSH  --   --   --  1.607  --   --   --  1.621  --   --   --   --  1.427  --   --   --   < > = values in this interval not displayed. Liver Function Tests:  Recent Labs  09/15/15 1702 09/16/15 0430  10/23/15 1108  10/26/15 0555 10/27/15 1347 11/28/15 1329  AST 16 15  --  23  --   --   --   --   ALT 10* 9*  --  13*  --   --   --   --   ALKPHOS 49 44  --  46  --   --   --   --   BILITOT 0.6 0.5  --  0.8  --   --   --   --   PROT 6.4* 6.1*  --  7.4  --   --   --   --   ALBUMIN 3.5 3.4*  < > 4.1  < > 2.8* 3.0* 3.2*  < > = values in this interval not displayed. No results for input(s): LIPASE, AMYLASE in the last 8760 hours.  Recent Labs  06/21/15 1420  AMMONIA 27   CBC:  Recent Labs  09/16/15 0430  10/27/15 1346 11/26/15 1412 11/26/15 1421 11/27/15 0856 11/28/15 0417  WBC 5.7  < > 2.5* 4.6  --  3.2* 3.7*  NEUTROABS 3.3  --  1.3* 2.9  --   --   --   HGB 9.0*  < > 8.9* 9.9* 10.9* 9.8* 9.5*  HCT 26.7*  < > 27.4* 31.5* 32.0* 30.8* 29.4*  MCV 95.7  < > 96.8 100.3*  --  99.7 97.4  PLT 99*  < > 49* 92*  --  93* 93*  < > = values in this interval not displayed. Lipid Panel:  Recent Labs  05/13/15 0925 05/20/15 0313  CHOL 107 111  HDL 66 53  LDLCALC 31 45  TRIG 48 63  CHOLHDL 1.6 2.1   Lab Results  Component Value Date   HGBA1C 5.3 10/23/2015    Procedures since last visit: Dg Chest Port 1 View  11/27/2015  CLINICAL  DATA:  Status post dialysis catheter placement EXAM: PORTABLE CHEST 1 VIEW COMPARISON:  11/26/2015 FINDINGS: Cardiac shadow is stable. The lungs are clear. No infiltrate or pneumothorax is seen. A dialysis catheter is noted in satisfactory position. IMPRESSION: No acute abnormality.  No pneumothorax following catheter placement. Electronically Signed   By: Alcide Clever M.D.   On: 11/27/2015 15:59   Dg Chest Portable 1 View  11/26/2015  CLINICAL DATA:  Weakness EXAM: PORTABLE CHEST 1 VIEW COMPARISON:  10/23/2015 FINDINGS: Cardiomediastinal silhouette is stable. No acute infiltrate or pleural effusion. No pulmonary edema. Bony thorax is unremarkable. IMPRESSION: No active disease. Electronically Signed   By: Natasha Mead M.D.   On: 11/26/2015 14:09   Dg Fluoro Guide Cv Line-no Report  11/27/2015  CLINICAL DATA:  FLOURO GUIDE CV LINE Fluoroscopy was utilized by the requesting physician.  No radiographic interpretation.    Assessment/Plan 1. Left arm swelling -due to 100% occluded subclavian vein -called vascular and vein office and spoke with [name] who discussed my concerns with Dr. Imogene Burn about the area being hard and swollen especially just proximal to his elbow -they advised that this was an expected finding, but if it gets worse to call them back--he is to be elevating and resting his arm--asked CMA to call pt's son back with this advice  2. Midline low back pain without sciatica - continues at HD and lidoderm falling off, tramadol not recommended with his renal fn/HD - buprenorphine (BUTRANS) 5 MCG/HR PTWK patch; Place 1 patch (5 mcg total) onto the skin once a week.  Dispense: 4 patch; Refill: 3 RX provided to bring to the pharmacy  3. Controlled type 2 diabetes mellitus with diabetic nephropathy, without long-term current use of insulin (HCC) - cont current regimen which has been working w/o known hypoglycemia - HUMALOG KWIKPEN 100 UNIT/ML KiwkPen; Inject 0-0.1 mLs (0-10 Units total) into  the skin as needed (prn based on blood sugar reading). Sliding Scale  Dispense: 15 mL; Refill: 5  4. Chronic diastolic congestive heart failure (HCC) -cont HD to help with volume status management and monitor -avoid high sodium foods  5. ESRD on dialysis (HCC) -cont HD three times weekly -pt not happy at HD much of the time--has various complaints -discussed with his son and he will discuss with his sister and pt and possibly arrange a visit to a different center to see if he'd be happier there  6. AV junctional bradycardia -identified during one of his recent hospitalizations -he was determined not to be a good candidate for pacemaker due to risks of infection  7. Seizure disorder (HCC) -is still not taking his vimpat correctly--he has the dosage correct, but frequency wrong--is taking once  a day not twice a day--notified [name] of this also -he notes that pt has refused his son and daughter in law's help with his medications -discussed option of a programmable pillbox to help with this scenario and twice daily caregivers for a few hrs at a time to help with medication administration  8. Subdural hematoma (HCC) -noted to be resolved on most recent CT brain  Labs/tests ordered:  None today; will need routine labs next visit  Next appt:  12/29/2015  Malicia Blasdel L. Tamas Suen, D.O. Geriatrics [facility] [address], [place] [address] Cell Phone (Mon-Fri 8am-5pm):  [phone] On Call:  [phone] & follow prompts after 5pm & weekends Office Phone:  [phone] Office Fax:  [phone]

## 2015-12-08 DIAGNOSIS — N186 End stage renal disease: Secondary | ICD-10-CM | POA: Diagnosis not present

## 2015-12-08 DIAGNOSIS — D509 Iron deficiency anemia, unspecified: Secondary | ICD-10-CM | POA: Diagnosis not present

## 2015-12-08 DIAGNOSIS — D631 Anemia in chronic kidney disease: Secondary | ICD-10-CM | POA: Diagnosis not present

## 2015-12-09 DIAGNOSIS — I5030 Unspecified diastolic (congestive) heart failure: Secondary | ICD-10-CM | POA: Diagnosis not present

## 2015-12-09 DIAGNOSIS — I132 Hypertensive heart and chronic kidney disease with heart failure and with stage 5 chronic kidney disease, or end stage renal disease: Secondary | ICD-10-CM | POA: Diagnosis not present

## 2015-12-09 DIAGNOSIS — I272 Other secondary pulmonary hypertension: Secondary | ICD-10-CM | POA: Diagnosis not present

## 2015-12-09 DIAGNOSIS — M6281 Muscle weakness (generalized): Secondary | ICD-10-CM | POA: Diagnosis not present

## 2015-12-09 DIAGNOSIS — N186 End stage renal disease: Secondary | ICD-10-CM | POA: Diagnosis not present

## 2015-12-09 DIAGNOSIS — D631 Anemia in chronic kidney disease: Secondary | ICD-10-CM | POA: Diagnosis not present

## 2015-12-09 DIAGNOSIS — E1140 Type 2 diabetes mellitus with diabetic neuropathy, unspecified: Secondary | ICD-10-CM | POA: Diagnosis not present

## 2015-12-09 DIAGNOSIS — E1122 Type 2 diabetes mellitus with diabetic chronic kidney disease: Secondary | ICD-10-CM | POA: Diagnosis not present

## 2015-12-09 DIAGNOSIS — R001 Bradycardia, unspecified: Secondary | ICD-10-CM | POA: Diagnosis not present

## 2015-12-09 DIAGNOSIS — I440 Atrioventricular block, first degree: Secondary | ICD-10-CM | POA: Diagnosis not present

## 2015-12-09 DIAGNOSIS — S065X0S Traumatic subdural hemorrhage without loss of consciousness, sequela: Secondary | ICD-10-CM | POA: Diagnosis not present

## 2015-12-10 ENCOUNTER — Ambulatory Visit: Payer: Medicare Other | Admitting: Internal Medicine

## 2015-12-11 ENCOUNTER — Encounter: Payer: Medicare Other | Admitting: Cardiology

## 2015-12-11 DIAGNOSIS — D631 Anemia in chronic kidney disease: Secondary | ICD-10-CM | POA: Diagnosis not present

## 2015-12-11 DIAGNOSIS — M6281 Muscle weakness (generalized): Secondary | ICD-10-CM | POA: Diagnosis not present

## 2015-12-11 DIAGNOSIS — I132 Hypertensive heart and chronic kidney disease with heart failure and with stage 5 chronic kidney disease, or end stage renal disease: Secondary | ICD-10-CM | POA: Diagnosis not present

## 2015-12-11 DIAGNOSIS — E1140 Type 2 diabetes mellitus with diabetic neuropathy, unspecified: Secondary | ICD-10-CM | POA: Diagnosis not present

## 2015-12-11 DIAGNOSIS — N186 End stage renal disease: Secondary | ICD-10-CM | POA: Diagnosis not present

## 2015-12-11 DIAGNOSIS — I440 Atrioventricular block, first degree: Secondary | ICD-10-CM | POA: Diagnosis not present

## 2015-12-11 DIAGNOSIS — I272 Other secondary pulmonary hypertension: Secondary | ICD-10-CM | POA: Diagnosis not present

## 2015-12-11 DIAGNOSIS — S065X0S Traumatic subdural hemorrhage without loss of consciousness, sequela: Secondary | ICD-10-CM | POA: Diagnosis not present

## 2015-12-11 DIAGNOSIS — E1122 Type 2 diabetes mellitus with diabetic chronic kidney disease: Secondary | ICD-10-CM | POA: Diagnosis not present

## 2015-12-11 DIAGNOSIS — I5030 Unspecified diastolic (congestive) heart failure: Secondary | ICD-10-CM | POA: Diagnosis not present

## 2015-12-11 DIAGNOSIS — R001 Bradycardia, unspecified: Secondary | ICD-10-CM | POA: Diagnosis not present

## 2015-12-11 NOTE — Progress Notes (Signed)
This encounter was created in error - please disregard.

## 2015-12-12 ENCOUNTER — Other Ambulatory Visit: Payer: Self-pay

## 2015-12-12 ENCOUNTER — Inpatient Hospital Stay (HOSPITAL_COMMUNITY): Payer: Medicare Other

## 2015-12-12 ENCOUNTER — Encounter (HOSPITAL_COMMUNITY): Payer: Self-pay

## 2015-12-12 ENCOUNTER — Encounter: Payer: Self-pay | Admitting: Cardiology

## 2015-12-12 ENCOUNTER — Emergency Department (HOSPITAL_COMMUNITY): Payer: Medicare Other

## 2015-12-12 ENCOUNTER — Inpatient Hospital Stay (HOSPITAL_COMMUNITY)
Admission: EM | Admit: 2015-12-12 | Discharge: 2015-12-15 | DRG: 242 | Disposition: A | Discharge status: Skilled Nursing Facility | Payer: Medicare Other | Attending: Internal Medicine | Admitting: Internal Medicine

## 2015-12-12 ENCOUNTER — Encounter (HOSPITAL_COMMUNITY)
Admission: EM | Disposition: A | Discharge status: Skilled Nursing Facility | Payer: Self-pay | Source: Home / Self Care | Attending: Internal Medicine

## 2015-12-12 DIAGNOSIS — R531 Weakness: Secondary | ICD-10-CM | POA: Diagnosis not present

## 2015-12-12 DIAGNOSIS — N189 Chronic kidney disease, unspecified: Secondary | ICD-10-CM | POA: Diagnosis not present

## 2015-12-12 DIAGNOSIS — E872 Acidosis: Secondary | ICD-10-CM | POA: Diagnosis not present

## 2015-12-12 DIAGNOSIS — D696 Thrombocytopenia, unspecified: Secondary | ICD-10-CM | POA: Diagnosis present

## 2015-12-12 DIAGNOSIS — R0602 Shortness of breath: Secondary | ICD-10-CM | POA: Diagnosis not present

## 2015-12-12 DIAGNOSIS — K219 Gastro-esophageal reflux disease without esophagitis: Secondary | ICD-10-CM | POA: Diagnosis not present

## 2015-12-12 DIAGNOSIS — R4182 Altered mental status, unspecified: Secondary | ICD-10-CM | POA: Diagnosis present

## 2015-12-12 DIAGNOSIS — M5136 Other intervertebral disc degeneration, lumbar region: Secondary | ICD-10-CM

## 2015-12-12 DIAGNOSIS — Z95818 Presence of other cardiac implants and grafts: Secondary | ICD-10-CM

## 2015-12-12 DIAGNOSIS — E1122 Type 2 diabetes mellitus with diabetic chronic kidney disease: Secondary | ICD-10-CM | POA: Diagnosis present

## 2015-12-12 DIAGNOSIS — N186 End stage renal disease: Secondary | ICD-10-CM | POA: Diagnosis not present

## 2015-12-12 DIAGNOSIS — F0390 Unspecified dementia without behavioral disturbance: Secondary | ICD-10-CM | POA: Diagnosis present

## 2015-12-12 DIAGNOSIS — M1990 Unspecified osteoarthritis, unspecified site: Secondary | ICD-10-CM | POA: Diagnosis not present

## 2015-12-12 DIAGNOSIS — Z992 Dependence on renal dialysis: Secondary | ICD-10-CM

## 2015-12-12 DIAGNOSIS — I10 Essential (primary) hypertension: Secondary | ICD-10-CM | POA: Diagnosis present

## 2015-12-12 DIAGNOSIS — I5040 Unspecified combined systolic (congestive) and diastolic (congestive) heart failure: Secondary | ICD-10-CM | POA: Diagnosis not present

## 2015-12-12 DIAGNOSIS — E1121 Type 2 diabetes mellitus with diabetic nephropathy: Secondary | ICD-10-CM | POA: Diagnosis present

## 2015-12-12 DIAGNOSIS — N19 Unspecified kidney failure: Secondary | ICD-10-CM | POA: Diagnosis not present

## 2015-12-12 DIAGNOSIS — Z794 Long term (current) use of insulin: Secondary | ICD-10-CM

## 2015-12-12 DIAGNOSIS — N2581 Secondary hyperparathyroidism of renal origin: Secondary | ICD-10-CM | POA: Diagnosis present

## 2015-12-12 DIAGNOSIS — G40909 Epilepsy, unspecified, not intractable, without status epilepticus: Secondary | ICD-10-CM | POA: Diagnosis present

## 2015-12-12 DIAGNOSIS — E119 Type 2 diabetes mellitus without complications: Secondary | ICD-10-CM | POA: Diagnosis not present

## 2015-12-12 DIAGNOSIS — I132 Hypertensive heart and chronic kidney disease with heart failure and with stage 5 chronic kidney disease, or end stage renal disease: Secondary | ICD-10-CM | POA: Diagnosis not present

## 2015-12-12 DIAGNOSIS — G9340 Encephalopathy, unspecified: Secondary | ICD-10-CM

## 2015-12-12 DIAGNOSIS — R609 Edema, unspecified: Secondary | ICD-10-CM

## 2015-12-12 DIAGNOSIS — I5032 Chronic diastolic (congestive) heart failure: Secondary | ICD-10-CM | POA: Diagnosis present

## 2015-12-12 DIAGNOSIS — D631 Anemia in chronic kidney disease: Secondary | ICD-10-CM | POA: Diagnosis present

## 2015-12-12 DIAGNOSIS — I495 Sick sinus syndrome: Secondary | ICD-10-CM | POA: Diagnosis not present

## 2015-12-12 DIAGNOSIS — Z79899 Other long term (current) drug therapy: Secondary | ICD-10-CM | POA: Diagnosis not present

## 2015-12-12 DIAGNOSIS — D649 Anemia, unspecified: Secondary | ICD-10-CM | POA: Diagnosis not present

## 2015-12-12 DIAGNOSIS — I272 Other secondary pulmonary hypertension: Secondary | ICD-10-CM | POA: Diagnosis not present

## 2015-12-12 DIAGNOSIS — R001 Bradycardia, unspecified: Secondary | ICD-10-CM

## 2015-12-12 DIAGNOSIS — E8729 Other acidosis: Secondary | ICD-10-CM

## 2015-12-12 DIAGNOSIS — Z950 Presence of cardiac pacemaker: Secondary | ICD-10-CM | POA: Diagnosis not present

## 2015-12-12 DIAGNOSIS — Z66 Do not resuscitate: Secondary | ICD-10-CM | POA: Diagnosis not present

## 2015-12-12 DIAGNOSIS — R402411 Glasgow coma scale score 13-15, in the field [EMT or ambulance]: Secondary | ICD-10-CM | POA: Diagnosis not present

## 2015-12-12 DIAGNOSIS — Z981 Arthrodesis status: Secondary | ICD-10-CM | POA: Diagnosis not present

## 2015-12-12 DIAGNOSIS — M069 Rheumatoid arthritis, unspecified: Secondary | ICD-10-CM | POA: Diagnosis not present

## 2015-12-12 DIAGNOSIS — M51369 Other intervertebral disc degeneration, lumbar region without mention of lumbar back pain or lower extremity pain: Secondary | ICD-10-CM

## 2015-12-12 DIAGNOSIS — M6281 Muscle weakness (generalized): Secondary | ICD-10-CM | POA: Diagnosis not present

## 2015-12-12 HISTORY — PX: EP IMPLANTABLE DEVICE: SHX172B

## 2015-12-12 LAB — GLUCOSE, CAPILLARY: GLUCOSE-CAPILLARY: 121 mg/dL — AB (ref 65–99)

## 2015-12-12 LAB — BASIC METABOLIC PANEL
Anion gap: 17 — ABNORMAL HIGH (ref 5–15)
BUN: 91 mg/dL — AB (ref 6–20)
CO2: 20 mmol/L — ABNORMAL LOW (ref 22–32)
CREATININE: 11.53 mg/dL — AB (ref 0.61–1.24)
Calcium: 9.6 mg/dL (ref 8.9–10.3)
Chloride: 100 mmol/L — ABNORMAL LOW (ref 101–111)
GFR, EST AFRICAN AMERICAN: 4 mL/min — AB (ref >60)
GFR, EST NON AFRICAN AMERICAN: 4 mL/min — AB (ref >60)
Glucose, Bld: 217 mg/dL — ABNORMAL HIGH (ref 65–99)
Potassium: 5.1 mmol/L (ref 3.5–5.1)
SODIUM: 137 mmol/L (ref 135–145)

## 2015-12-12 LAB — I-STAT TROPONIN, ED: TROPONIN I, POC: 0.08 ng/mL (ref 0.00–0.08)

## 2015-12-12 LAB — CBC WITH DIFFERENTIAL/PLATELET
BASOS PCT: 0 %
Basophils Absolute: 0 10*3/uL (ref 0.0–0.1)
EOS ABS: 0.1 10*3/uL (ref 0.0–0.7)
EOS PCT: 2 %
HCT: 32.7 % — ABNORMAL LOW (ref 39.0–52.0)
Hemoglobin: 10.7 g/dL — ABNORMAL LOW (ref 13.0–17.0)
LYMPHS ABS: 1.1 10*3/uL (ref 0.7–4.0)
Lymphocytes Relative: 21 %
MCH: 32 pg (ref 26.0–34.0)
MCHC: 32.7 g/dL (ref 30.0–36.0)
MCV: 97.9 fL (ref 78.0–100.0)
MONOS PCT: 5 %
Monocytes Absolute: 0.3 10*3/uL (ref 0.1–1.0)
Neutro Abs: 3.5 10*3/uL (ref 1.7–7.7)
Neutrophils Relative %: 71 %
PLATELETS: 95 10*3/uL — AB (ref 150–400)
RBC: 3.34 MIL/uL — AB (ref 4.22–5.81)
RDW: 15.9 % — ABNORMAL HIGH (ref 11.5–15.5)
WBC: 4.9 10*3/uL (ref 4.0–10.5)

## 2015-12-12 LAB — ECHOCARDIOGRAM COMPLETE
HEIGHTINCHES: 73 in
Weight: 3120 oz

## 2015-12-12 LAB — I-STAT CHEM 8, ED
BUN: 90 mg/dL — AB (ref 6–20)
CALCIUM ION: 1.14 mmol/L (ref 1.13–1.30)
Chloride: 100 mmol/L — ABNORMAL LOW (ref 101–111)
Creatinine, Ser: 11.2 mg/dL — ABNORMAL HIGH (ref 0.61–1.24)
Glucose, Bld: 211 mg/dL — ABNORMAL HIGH (ref 65–99)
HEMATOCRIT: 34 % — AB (ref 39.0–52.0)
HEMOGLOBIN: 11.6 g/dL — AB (ref 13.0–17.0)
Potassium: 4.9 mmol/L (ref 3.5–5.1)
SODIUM: 136 mmol/L (ref 135–145)
TCO2: 24 mmol/L (ref 0–100)

## 2015-12-12 LAB — CBG MONITORING, ED: Glucose-Capillary: 191 mg/dL — ABNORMAL HIGH (ref 65–99)

## 2015-12-12 SURGERY — PACEMAKER IMPLANT
Anesthesia: LOCAL

## 2015-12-12 MED ORDER — AMLODIPINE BESYLATE 10 MG PO TABS
10.0000 mg | ORAL_TABLET | Freq: Every day | ORAL | Status: DC
  Administered 2015-12-13 – 2015-12-14 (×2): 10 mg via ORAL
  Filled 2015-12-12 (×2): qty 1

## 2015-12-12 MED ORDER — HEPARIN SODIUM (PORCINE) 1000 UNIT/ML DIALYSIS
1000.0000 [IU] | INTRAMUSCULAR | Status: DC | PRN
  Filled 2015-12-12: qty 1

## 2015-12-12 MED ORDER — CALCIUM ACETATE (PHOS BINDER) 667 MG PO CAPS
2001.0000 mg | ORAL_CAPSULE | Freq: Three times a day (TID) | ORAL | Status: DC
  Administered 2015-12-13 – 2015-12-15 (×7): 2001 mg via ORAL
  Filled 2015-12-12 (×7): qty 3

## 2015-12-12 MED ORDER — SODIUM CHLORIDE 0.9 % IV SOLN: 100.0000 mL | INTRAVENOUS | Status: DC | PRN

## 2015-12-12 MED ORDER — SODIUM CHLORIDE 0.9% FLUSH
3.0000 mL | Freq: Two times a day (BID) | INTRAVENOUS | Status: DC
  Administered 2015-12-13 – 2015-12-14 (×4): 3 mL via INTRAVENOUS

## 2015-12-12 MED ORDER — CALCITRIOL 0.5 MCG PO CAPS: 0.7500 ug | ORAL_CAPSULE | ORAL | Status: DC

## 2015-12-12 MED ORDER — LIDOCAINE HCL (PF) 1 % IJ SOLN: 5.0000 mL | INTRAMUSCULAR | Status: DC | PRN

## 2015-12-12 MED ORDER — PANTOPRAZOLE SODIUM 40 MG PO TBEC
40.0000 mg | DELAYED_RELEASE_TABLET | Freq: Two times a day (BID) | ORAL | Status: DC
  Administered 2015-12-12 – 2015-12-15 (×6): 40 mg via ORAL
  Filled 2015-12-12 (×6): qty 1

## 2015-12-12 MED ORDER — FOLIC ACID 1 MG PO TABS
1.0000 mg | ORAL_TABLET | Freq: Every day | ORAL | Status: DC
  Administered 2015-12-13 – 2015-12-14 (×2): 1 mg via ORAL
  Filled 2015-12-12 (×2): qty 1

## 2015-12-12 MED ORDER — LIDOCAINE-PRILOCAINE 2.5-2.5 % EX CREA: 1.0000 "application " | TOPICAL_CREAM | CUTANEOUS | Status: DC | PRN

## 2015-12-12 MED ORDER — SENNOSIDES-DOCUSATE SODIUM 8.6-50 MG PO TABS
1.0000 | ORAL_TABLET | Freq: Every evening | ORAL | Status: DC | PRN
  Administered 2015-12-14: 1 via ORAL
  Filled 2015-12-12: qty 1

## 2015-12-12 MED ORDER — DIVALPROEX SODIUM ER 250 MG PO TB24
250.0000 mg | ORAL_TABLET | Freq: Two times a day (BID) | ORAL | Status: DC
  Administered 2015-12-12 – 2015-12-15 (×6): 250 mg via ORAL
  Filled 2015-12-12 (×6): qty 1

## 2015-12-12 MED ORDER — LACOSAMIDE 50 MG PO TABS
100.0000 mg | ORAL_TABLET | Freq: Two times a day (BID) | ORAL | Status: DC
  Administered 2015-12-12 – 2015-12-15 (×6): 100 mg via ORAL
  Filled 2015-12-12 (×6): qty 2

## 2015-12-12 MED ORDER — HEPARIN (PORCINE) IN NACL 2-0.9 UNIT/ML-% IJ SOLN
INTRAMUSCULAR | Status: AC
  Filled 2015-12-12: qty 500

## 2015-12-12 MED ORDER — ONDANSETRON HCL 4 MG/2ML IJ SOLN: 4.0000 mg | Freq: Four times a day (QID) | INTRAMUSCULAR | Status: DC | PRN

## 2015-12-12 MED ORDER — DARBEPOETIN ALFA 150 MCG/0.3ML IJ SOSY: 150.0000 ug | PREFILLED_SYRINGE | INTRAMUSCULAR | Status: DC

## 2015-12-12 MED ORDER — SODIUM CHLORIDE 0.9 % IR SOLN
80.0000 mg | Status: DC
  Administered 2015-12-12: 80 mg

## 2015-12-12 MED ORDER — ACETAMINOPHEN 325 MG PO TABS
325.0000 mg | ORAL_TABLET | ORAL | Status: DC | PRN
  Administered 2015-12-13: 650 mg via ORAL
  Filled 2015-12-12: qty 2

## 2015-12-12 MED ORDER — RENA-VITE PO TABS
1.0000 | ORAL_TABLET | Freq: Every day | ORAL | Status: DC
  Administered 2015-12-12 – 2015-12-14 (×3): 1 via ORAL
  Filled 2015-12-12 (×3): qty 1

## 2015-12-12 MED ORDER — CEFAZOLIN SODIUM-DEXTROSE 2-4 GM/100ML-% IV SOLN
2.0000 g | INTRAVENOUS | Status: DC
  Administered 2015-12-12: 2 g via INTRAVENOUS

## 2015-12-12 MED ORDER — CEFAZOLIN SODIUM-DEXTROSE 2-3 GM-% IV SOLR
INTRAVENOUS | Status: AC
  Filled 2015-12-12: qty 50

## 2015-12-12 MED ORDER — SODIUM CHLORIDE 0.9 % IV SOLN
62.5000 mg | INTRAVENOUS | Status: DC
  Administered 2015-12-12: 62.5 mg via INTRAVENOUS
  Filled 2015-12-12: qty 5

## 2015-12-12 MED ORDER — CEFAZOLIN SODIUM 1-5 GM-% IV SOLN
1.0000 g | Freq: Four times a day (QID) | INTRAVENOUS | Status: DC
  Filled 2015-12-12 (×3): qty 50

## 2015-12-12 MED ORDER — CALCITRIOL 0.5 MCG PO CAPS
1.0000 ug | ORAL_CAPSULE | ORAL | Status: DC
  Administered 2015-12-15: 1 ug via ORAL

## 2015-12-12 MED ORDER — INSULIN ASPART 100 UNIT/ML ~~LOC~~ SOLN
0.0000 [IU] | Freq: Three times a day (TID) | SUBCUTANEOUS | Status: DC
  Administered 2015-12-13 – 2015-12-14 (×3): 1 [IU] via SUBCUTANEOUS
  Administered 2015-12-14: 5 [IU] via SUBCUTANEOUS
  Administered 2015-12-14: 2 [IU] via SUBCUTANEOUS

## 2015-12-12 MED ORDER — LIDOCAINE HCL (PF) 1 % IJ SOLN
INTRAMUSCULAR | Status: AC
  Filled 2015-12-12: qty 60

## 2015-12-12 MED ORDER — ALTEPLASE 2 MG IJ SOLR: 2.0000 mg | Freq: Once | INTRAMUSCULAR | Status: DC | PRN

## 2015-12-12 MED ORDER — PENTAFLUOROPROP-TETRAFLUOROETH EX AERO: 1.0000 "application " | INHALATION_SPRAY | CUTANEOUS | Status: DC | PRN

## 2015-12-12 MED ORDER — CINACALCET HCL 30 MG PO TABS
30.0000 mg | ORAL_TABLET | Freq: Every day | ORAL | Status: DC
  Administered 2015-12-14: 30 mg via ORAL
  Filled 2015-12-12 (×3): qty 1

## 2015-12-12 MED ORDER — SODIUM CHLORIDE 0.9 % IR SOLN
Status: AC
  Filled 2015-12-12: qty 2

## 2015-12-12 MED ORDER — IOHEXOL 350 MG/ML SOLN
INTRAVENOUS | Status: DC | PRN
  Administered 2015-12-12: 20 mL via INTRAVENOUS

## 2015-12-12 MED ORDER — HEPARIN (PORCINE) IN NACL 2-0.9 UNIT/ML-% IJ SOLN
INTRAMUSCULAR | Status: DC | PRN
Start: 2015-12-12 — End: 2015-12-12
  Administered 2015-12-12: 500 mL

## 2015-12-12 MED ORDER — LIDOCAINE HCL (PF) 1 % IJ SOLN
INTRAMUSCULAR | Status: DC | PRN
  Administered 2015-12-12: 45 mL

## 2015-12-12 SURGICAL SUPPLY — 8 items
CABLE SURGICAL S-101-97-12 (CABLE) ×1 IMPLANT
LEAD CAPSURE NOVUS 5076-52CM (Lead) ×1 IMPLANT
LEAD CAPSURE NOVUS 5076-58CM (Lead) ×1 IMPLANT
PACEMAKER ADAPTA DR ADDRL1 (Pacemaker) IMPLANT
PAD DEFIB LIFELINK (PAD) ×1 IMPLANT
PPM ADAPTA DR ADDRL1 (Pacemaker) ×2 IMPLANT
SHEATH CLASSIC 7F (SHEATH) ×2 IMPLANT
TRAY PACEMAKER INSERTION (PACKS) ×1 IMPLANT

## 2015-12-12 NOTE — Progress Notes (Signed)
Rt arm assessed with ultrasound for PIV.  Attempt x 1 unsuccessful.  Poor veins.  No other suitable veins.

## 2015-12-12 NOTE — ED Notes (Signed)
Pt placed on zoll pads 

## 2015-12-12 NOTE — ED Notes (Signed)
CBG 191 

## 2015-12-12 NOTE — ED Notes (Signed)
IV team unable to obtain IV x2.

## 2015-12-12 NOTE — Consult Note (Signed)
[facility] Renal Consultation Note    Indication for Consultation:  Management of ESRD/hemodialysis; anemia, hypertension/volume and secondary hyperparathyroidism PCP: [name], DO  HPI: This is the fifth admission in 2017 for [name] is a 82 y.o. male with ESRD on MWF HD at [facility] with PMHx significant for DM, HTN, GIB, subdural hematoma from a fall, dialysis access problems including 100% of left SCV with collaterals and chronic left upper extremity swelling, pseudomonas bacteremia Feb 2017 with removal of TDC and short term use of LUE AVGG until another [facility] was placed during his 3/22-3/24/2017 admission for bradycardia, hyperkalemia.  Pacemaker was not placed during that admission due to the risk of infection and that bradycardia was thought secondary to hyperkalemia as it resolved post HD. K is 5.1today. The patient has not dialyzed since Monday. He cannot explain why he missed except "he didn't want to go."  Review of pre and post heart rates at dialysis show they are usually in the 60 - 70 range, but apparently it has dropped in the 30 - 40s during treatment..  Evaluation in the ED showed CXR with NAD, WBC 4.9 with normal diff, BUN 91 Cr 11.53 CO2 20 glu 217 Ca 9.6. Troponin 0.08 EKG showed junctional rhythm rate 33.  Plans are for pacemaker to be placed this afternoon.  He is due for dialysis today.  Past Medical History  Diagnosis Date  . Hypertension   . Diabetes mellitus without complication (HCC)   . Arthritis   . History of blood transfusion   . GI bleed   . ESRD (end stage renal disease) (HCC)   . Seizure (HCC)   . Dialysis patient (HCC)   . Subdural hematoma (HCC) 05/2015    from fall  . Sleep apnea     wears CPAP n/c  . Pulmonary hypertension (HCC)     severe pulmonary HTN by 01/14/13 echo   Past Surgical History  Procedure Laterality Date  . Esophagogastroduodenoscopy  10/03/2012    Procedure: ESOPHAGOGASTRODUODENOSCOPY (EGD);  Surgeon: Theda Belfast, MD;  Location: [facility] ENDOSCOPY;  Service: Endoscopy;  Laterality: N/A;  . Colonoscopy  10/04/2012    Procedure: COLONOSCOPY;  Surgeon: Theda Belfast, MD;  Location: [facility] ENDOSCOPY;  Service: Endoscopy;  Laterality: N/A;  . Colonoscopy with esophagogastroduodenoscopy (egd) Left 11/30/2012    Procedure: COLONOSCOPY WITH ESOPHAGOGASTRODUODENOSCOPY (EGD);  Surgeon: Theda Belfast, MD;  Location: [facility] ENDOSCOPY;  Service: Endoscopy;  Laterality: Left;  . Spine surgery  2004    back fusion/ [facility] Penumalli,MD  . Retinal laser procedure Right 04/2015  . Av fistula placement Left 09/09/2015    Procedure: INSERTION OF ARTERIOVENOUS  GORE-TEX GRAFT Left  ARM;  Surgeon: Sherren Kerns, MD;  Location: [facility] OR;  Service: Vascular;  Laterality: Left;  . Insertion of dialysis catheter Right 11/27/2015    Procedure: INSERTION OF DIALYSIS CATHETER - Right Internal Jugular Placement;  Surgeon: Chuck Hint, MD;  Location: [facility] OR;  Service: Vascular;  Laterality: Right;   Family History  Problem Relation Age of Onset  . Sudden death Mother   . Sudden death Father    Social History:  reports that he has never smoked. He has never used smokeless tobacco. He reports that he does not drink alcohol or use illicit drugs. Allergies  Allergen Reactions  . Aspirin Other (See Comments)    Bleeding   . Lactose Intolerance (Gi) Diarrhea   Prior to Admission medications   Medication Sig Start Date End  Date Taking? Authorizing Provider  amLODipine (NORVASC) 10 MG tablet Take 10 mg by mouth daily.   Yes Historical Provider, MD  buprenorphine (BUTRANS) 5 MCG/HR PTWK patch Place 1 patch (5 mcg total) onto the skin once a week. 12/05/15  Yes [name] L [name], DO  calcium acetate (PHOSLO) 667 MG capsule Take 3 capsules by mouth three times a day 07/15/15  Yes Sharon Seller, NP  cinacalcet (SENSIPAR) 30 MG tablet Take 1 tablet (30 mg total) by mouth daily with supper. Patient taking differently: Take 60 mg by mouth daily  with supper.  09/18/15  Yes Vassie Loll, MD  colchicine 0.6 MG tablet Take 1 tablet (0.6 mg total) by mouth as needed. For gout. Patient needs an appointment before anymore refills. 09/29/15  Yes [name] L [name], DO  divalproex (DEPAKOTE ER) 250 MG 24 hr tablet Take 250 mg by mouth every 12 (twelve) hours.   Yes Historical Provider, MD  fexofenadine (ALLEGRA) 180 MG tablet Take 180 mg by mouth daily as needed for allergies or rhinitis.   Yes Historical Provider, MD  folic acid (FOLVITE) 1 MG tablet Take 1 tablet (1 mg total) by mouth daily. 11/07/15  Yes [name] L [name], DO  gabapentin (NEURONTIN) 100 MG capsule Take 1 capsule (100 mg total) by mouth daily. Patient taking differently: Take 100 mg by mouth 3 (three) times daily as needed (pain).  05/23/15  Yes Lonia Blood, MD  HUMALOG KWIKPEN 100 UNIT/ML KiwkPen Inject 0-0.1 mLs (0-10 Units total) into the skin as needed (prn based on blood sugar reading). Sliding Scale 12/05/15  Yes [name] L [name], DO  insulin aspart protamine- aspart (NOVOLOG MIX 70/30) (70-30) 100 UNIT/ML injection Inject 0-10 Units into the skin 3 (three) times daily.   Yes Historical Provider, MD  lacosamide (VIMPAT) 50 MG TABS tablet 150mg  BID on M-W-F (dialysis days); 100mg  BID on Tue-Thu-Sat-Sun (non-dialysis days) Patient taking differently: Take 100 mg by mouth 2 (two) times daily. 150mg  BID on M-W-F (dialysis days); 100mg  BID on Tue-Thu-Sat-Sun (non-dialysis days) 11/13/15  Yes Suanne Marker, MD  multivitamin (RENA-VIT) TABS tablet Take 1 tablet by mouth at bedtime. 09/29/13  Yes Jessica U Vann, DO  pantoprazole (PROTONIX) 40 MG tablet Take 1 tablet (40 mg total) by mouth 2 (two) times daily. 10/27/15  Yes Rhetta Mura, MD  sennosides-docusate sodium (SENOKOT-S) 8.6-50 MG tablet Take 1 tablet by mouth at bedtime as needed for constipation.   Yes Historical Provider, MD  calcitRIOL (ROCALTROL) 0.5 MCG capsule Take 2 capsules (1 mcg total) by mouth every Monday,  Wednesday, and Friday with hemodialysis. 05/23/15   Lonia Blood, MD  darbepoetin [facility]) 60 MCG/0.3ML SOLN injection Inject 0.3 mLs (60 mcg total) into the vein every Thursday with hemodialysis. 09/29/13   Joseph Art, DO   Current Facility-Administered Medications  Medication Dose Route Frequency Provider Last Rate Last Dose  . calcitRIOL (ROCALTROL) capsule 0.75 mcg  0.75 mcg Oral Q M,W,F-HD Weston Settle, PA-C      . Darbepoetin Alfa (ARANESP) injection 150 mcg  150 mcg Intravenous Q Fri-HD Weston Settle, PA-C      . ferric gluconate (NULECIT) 62.5 mg in sodium chloride 0.9 % 100 mL IVPB  62.5 mg Intravenous Q Fri-HD Weston Settle, PA-C       Current Outpatient Prescriptions  Medication Sig Dispense Refill  . amLODipine (NORVASC) 10 MG tablet Take 10 mg by mouth daily.    . buprenorphine (BUTRANS) 5 MCG/HR PTWK patch Place 1 patch (  5 mcg total) onto the skin once a week. 4 patch 3  . calcium acetate (PHOSLO) 667 MG capsule Take 3 capsules by mouth three times a day 180 capsule 3  . cinacalcet (SENSIPAR) 30 MG tablet Take 1 tablet (30 mg total) by mouth daily with supper. (Patient taking differently: Take 60 mg by mouth daily with supper. )    . colchicine 0.6 MG tablet Take 1 tablet (0.6 mg total) by mouth as needed. For gout. Patient needs an appointment before anymore refills. 30 tablet 0  . divalproex (DEPAKOTE ER) 250 MG 24 hr tablet Take 250 mg by mouth every 12 (twelve) hours.    . fexofenadine (ALLEGRA) 180 MG tablet Take 180 mg by mouth daily as needed for allergies or rhinitis.    . folic acid (FOLVITE) 1 MG tablet Take 1 tablet (1 mg total) by mouth daily. 30 tablet 0  . gabapentin (NEURONTIN) 100 MG capsule Take 1 capsule (100 mg total) by mouth daily. (Patient taking differently: Take 100 mg by mouth 3 (three) times daily as needed (pain). ) 30 capsule 0  . HUMALOG KWIKPEN 100 UNIT/ML KiwkPen Inject 0-0.1 mLs (0-10 Units total) into the skin as needed (prn based on  blood sugar reading). Sliding Scale 15 mL 5  . insulin aspart protamine- aspart (NOVOLOG MIX 70/30) (70-30) 100 UNIT/ML injection Inject 0-10 Units into the skin 3 (three) times daily.    Marland Kitchen lacosamide (VIMPAT) 50 MG TABS tablet 150mg  BID on M-W-F (dialysis days); 100mg  BID on Tue-Thu-Sat-Sun (non-dialysis days) (Patient taking differently: Take 100 mg by mouth 2 (two) times daily. 150mg  BID on M-W-F (dialysis days); 100mg  BID on Tue-Thu-Sat-Sun (non-dialysis days)) 180 tablet 5  . multivitamin (RENA-VIT) TABS tablet Take 1 tablet by mouth at bedtime. 30 tablet 0  . pantoprazole (PROTONIX) 40 MG tablet Take 1 tablet (40 mg total) by mouth 2 (two) times daily. 60 tablet 0  . sennosides-docusate sodium (SENOKOT-S) 8.6-50 MG tablet Take 1 tablet by mouth at bedtime as needed for constipation.    . calcitRIOL (ROCALTROL) 0.5 MCG capsule Take 2 capsules (1 mcg total) by mouth every Monday, Wednesday, and Friday with hemodialysis.    Marland Kitchen darbepoetin (ARANESP) 60 MCG/0.3ML SOLN injection Inject 0.3 mLs (60 mcg total) into the vein every Thursday with hemodialysis. 4.2 mL    Facility-Administered Medications Ordered in Other Encounters  Medication Dose Route Frequency Provider Last Rate Last Dose  . Chlorhexidine Gluconate Cloth 2 % PADS 6 each  6 each Topical Once Sherren Kerns, MD       Labs: Basic Metabolic Panel:  Recent Labs Lab 12/12/15 0904 12/12/15 0906  NA 137 136  K 5.1 4.9  CL 100* 100*  CO2 20*  --   GLUCOSE 217* 211*  BUN 91* 90*  CREATININE 11.53* 11.20*  CALCIUM 9.6  --   CBC:  Recent Labs Lab 12/12/15 0904 12/12/15 0906  WBC 4.9  --   NEUTROABS 3.5  --   HGB 10.7* 11.6*  HCT 32.7* 34.0*  MCV 97.9  --   PLT 95*  --    CBG:  Recent Labs Lab 12/12/15 0856  GLUCAP 191*  Studies/Results: Dg Chest Portable 1 View  12/12/2015  CLINICAL DATA:  82 year old male with 2 days of shortness of breath and weakness. Missed dialysis. Initial encounter. EXAM: PORTABLE CHEST 1  VIEW COMPARISON:  11/27/2015 and earlier. FINDINGS: Portable AP semi upright view at 0931 hours. Stable tunneled type right chest dialysis catheter. Mildly lower  lung volumes. Slightly increased pulmonary vascularity but no acute edema. No pneumothorax, pleural effusion or confluent pulmonary opacity. Stable cardiac size and mediastinal contours. Visualized tracheal air column is within normal limits. IMPRESSION: No acute cardiopulmonary abnormality; slightly increased pulmonary vascularity without acute edema Electronically Signed   By: Odessa Fleming M.D.   On: 12/12/2015 09:41    ROS: As per HPI otherwise negative. Limited by pt's inability to give history. Physical Exam: Filed Vitals:   12/12/15 0915 12/12/15 0930 12/12/15 1000 12/12/15 1030  BP: 140/52 176/50 148/55 164/48  Pulse: 49 30 41 26  Temp:      TempSrc:      Resp: 16 15 13 10   Height:      Weight:      SpO2: 98% 99% 100% 95%     General: Well developed, well nourished AAM supine on stretcher sats good without O2 Head: Normocephalic, atraumatic, sclera non-icteric, mucus membranes are moist Neck: Supple. JVD not elevated. Lungs: Clear bilaterally to auscultation without wheezes, rales, or rhonchi anteriorly. Breathing is unlabored. Heart:  Bradycardic rate 30s Abdomen: Soft, non-tender, non-distended with normoactive bowel sounds.  M-S:  Strength and tone appear normal for age. Lower extremities:without edema or ischemic changes, no open wounds  Neuro: Awake, recognizes my face. Dialysis Access: right IJ TDC exit site no drainage - slightly reddened; left upper AVGG chronic swelling + bruit  Dialysis Orders: [place] 3.75 180 400/800 EDW 89.5 2 K 2.25 Ca profile 4 left upper AVGG- not using and right IJ active NO heparin venofer 50/week, Mircera 225 per week - last ESA dose 200 3/29  Calcitriol 0.75 Recent labs:  hgb 9.5 3/29, 10.6 3/17 iPTH 454 2/22 Recent dialysis attendance: 4/5 missed 4/3 3 hr 6 min 3/31 2 hr 50 min 3/29 3 hr  49 min   Assessment/Plan: 1.  Symptomatic bradycardia- pacemaker planned this afternoon; hopefully this will help his general feeling of well being. 2.  ESRD -  MWF with elevated BUN/CR due to missed treatments, failure to run full treatments; plan HD today after PM treatment; already on no heparin due to hx of GIB 3.  Hypertension/volume  - no volume on CXR, BP up a little generally 120 - 140 post HD; on norvasc 10 4.  Anemia  - hgb 10.7 - up from recent outpt; ESA due for redose next week - continue weekly Fe 5.  Metabolic bone disease -  Continue hectorol/binders (3 pholso ac and sensipar 60) per med list 6.  Nutrition - renal carb mod when eating/vitamin 7.    AMS- pt has some dementia at baseline; may be made worse by being underdialyzed 8.    Left arm swelling - secondary to left SCV stenosis; graft is still patent- has IV in left hand because he told EMS they have "given up on it"; one option is to refer to  VIR at [facility] (per conversation I had with Dr. 4/29 3/3); the patient was to consider this but then the AVGG worked well for a while after this conversation. Apparently there have been varying degrees of success with cannulation pluls patient doesn't really like the needles and subsequently [facility] was placed last admission.  Not clear if longer needles have been tried. 9.   Seizure disorder - per primary 10.  DM - per primary  11.  Dementia  [facility], PA-C [facility] Beeper [phone] 12/12/2015, 12:24 PM   Pt seen, examined and agree w A/P as above. ESRD patient with  dementia, seizure d/o, HTN and recurrent bradycardia presenting with another episode of bradycardia.  For pacemaker today. [name] [employer] pager [phone]    cell [phone] 12/12/2015, 1:12 PM

## 2015-12-12 NOTE — Consult Note (Signed)
ELECTROPHYSIOLOGY CONSULT NOTE    Patient ID: [name] MRN: [id number], DOB/AGE: May 27, 1932 82 y.o.  Admit date: 12/12/2015 Date of Consult: 12/12/2015  Primary Physician: [name], DO Primary Cardiologist: Elberta Fortis  Reason for Consultation: symptomatic bradycardia  HPI:  [name] is a 82 y.o. male with a past medical history significant for hypertension, diabetes, ESRD on HD, prior GI bleed, prior SDH, and OSA.  He was evalauted in October of last year for a fall and was found to be bradycardic with electrolyte abnormalities. He was dialyzed with no recurrence of bradycardia during that admission.  He has been doing fairly well at home (although history difficult to obtain 2/2 lethargy).  For the last 2 days, he reports that he has been more fatigued and has had weakness.  He has not had chest pain, shortness of breath, fevers, chills, frank syncope.  He reports compliance with HD.    Echo 01/2013 demonstrated EF 55-60%, no RWMA, grade 1 diastolic dysfunction, PA pressure 71, moderate AR  Past Medical History  Diagnosis Date  . Hypertension   . Diabetes mellitus without complication (HCC)   . Arthritis   . History of blood transfusion   . GI bleed   . ESRD (end stage renal disease) (HCC)   . Seizure (HCC)   . Dialysis patient (HCC)   . Subdural hematoma (HCC) 05/2015    from fall  . Sleep apnea     wears CPAP n/c  . Pulmonary hypertension (HCC)     severe pulmonary HTN by 01/14/13 echo     Surgical History:  Past Surgical History  Procedure Laterality Date  . Esophagogastroduodenoscopy  10/03/2012    Procedure: ESOPHAGOGASTRODUODENOSCOPY (EGD);  Surgeon: Theda Belfast, MD;  Location: [facility] ENDOSCOPY;  Service: Endoscopy;  Laterality: N/A;  . Colonoscopy  10/04/2012    Procedure: COLONOSCOPY;  Surgeon: Theda Belfast, MD;  Location: [facility] ENDOSCOPY;  Service: Endoscopy;  Laterality: N/A;  . Colonoscopy with esophagogastroduodenoscopy (egd) Left 11/30/2012    Procedure:  COLONOSCOPY WITH ESOPHAGOGASTRODUODENOSCOPY (EGD);  Surgeon: Theda Belfast, MD;  Location: [facility] ENDOSCOPY;  Service: Endoscopy;  Laterality: Left;  . Spine surgery  2004    back fusion/ [facility] Penumalli,MD  . Retinal laser procedure Right 04/2015  . Av fistula placement Left 09/09/2015    Procedure: INSERTION OF ARTERIOVENOUS  GORE-TEX GRAFT Left  ARM;  Surgeon: Sherren Kerns, MD;  Location[facility] OR;  Service: Vascular;  Laterality: Left;  . Insertion of dialysis catheter Right 11/27/2015    Procedure: INSERTION OF DIALYSIS CATHETER - Right Internal Jugular Placement;  Surgeon: Chuck Hint, MD;  Location: [facility] OR;  Service: Vascular;  Laterality: Right;     Current outpatient prescriptions:  .  amLODipine (NORVASC) 10 MG tablet, Take 10 mg by mouth daily., Disp: , Rfl:  .  buprenorphine (BUTRANS) 5 MCG/HR PTWK patch, Place 1 patch (5 mcg total) onto the skin once a week., Disp: 4 patch, Rfl: 3 .  calcium acetate (PHOSLO) 667 MG capsule, Take 3 capsules by mouth three times a day, Disp: 180 capsule, Rfl: 3 .  cinacalcet (SENSIPAR) 30 MG tablet, Take 1 tablet (30 mg total) by mouth daily with supper. (Patient taking differently: Take 60 mg by mouth daily with supper. ), Disp: , Rfl:  .  colchicine 0.6 MG tablet, Take 1 tablet (0.6 mg total) by mouth as needed. For gout. Patient needs an appointment before anymore refills., Disp: 30 tablet, Rfl: 0 .  divalproex (DEPAKOTE ER)  250 MG 24 hr tablet, Take 250 mg by mouth every 12 (twelve) hours., Disp: , Rfl:  .  fexofenadine (ALLEGRA) 180 MG tablet, Take 180 mg by mouth daily as needed for allergies or rhinitis., Disp: , Rfl:  .  folic acid (FOLVITE) 1 MG tablet, Take 1 tablet (1 mg total) by mouth daily., Disp: 30 tablet, Rfl: 0 .  gabapentin (NEURONTIN) 100 MG capsule, Take 1 capsule (100 mg total) by mouth daily. (Patient taking differently: Take 100 mg by mouth 3 (three) times daily as needed (pain). ), Disp: 30 capsule, Rfl: 0 .  HUMALOG KWIKPEN  100 UNIT/ML KiwkPen, Inject 0-0.1 mLs (0-10 Units total) into the skin as needed (prn based on blood sugar reading). Sliding Scale, Disp: 15 mL, Rfl: 5 .  insulin aspart protamine- aspart (NOVOLOG MIX 70/30) (70-30) 100 UNIT/ML injection, Inject 0-10 Units into the skin 3 (three) times daily., Disp: , Rfl:  .  lacosamide (VIMPAT) 50 MG TABS tablet, 150mg  BID on M-W-F (dialysis days); 100mg  BID on Tue-Thu-Sat-Sun (non-dialysis days) (Patient taking differently: Take 100 mg by mouth 2 (two) times daily. 150mg  BID on M-W-F (dialysis days); 100mg  BID on Tue-Thu-Sat-Sun (non-dialysis days)), Disp: 180 tablet, Rfl: 5 .  multivitamin (RENA-VIT) TABS tablet, Take 1 tablet by mouth at bedtime., Disp: 30 tablet, Rfl: 0 .  pantoprazole (PROTONIX) 40 MG tablet, Take 1 tablet (40 mg total) by mouth 2 (two) times daily., Disp: 60 tablet, Rfl: 0 .  sennosides-docusate sodium (SENOKOT-S) 8.6-50 MG tablet, Take 1 tablet by mouth at bedtime as needed for constipation., Disp: , Rfl:  .  calcitRIOL (ROCALTROL) 0.5 MCG capsule, Take 2 capsules (1 mcg total) by mouth every Monday, Wednesday, and Friday with hemodialysis., Disp: , Rfl:  .  darbepoetin (ARANESP) 60 MCG/0.3ML SOLN injection, Inject 0.3 mLs (60 mcg total) into the vein every Thursday with hemodialysis., Disp: 4.2 mL, Rfl:    Allergies:  Allergies  Allergen Reactions  . Aspirin Other (See Comments)    Bleeding   . Lactose Intolerance (Gi) Diarrhea    Social History   Social History  . Marital Status: Married    Spouse Name: patricia  . Number of Children: 2  . Years of Education: PHD   Occupational History  . Retired    Social History Main Topics  . Smoking status: Never Smoker   . Smokeless tobacco: Never Used  . Alcohol Use: No  . Drug Use: No  . Sexual Activity: No   Other Topics Concern  . Not on file   Social History Narrative   Patient currently in [facility].   Caffeine Use: 1-2 cups occasionally      Family History  Problem Relation Age of Onset  . Sudden death Mother   . Sudden death Father      Review of Systems: All other systems reviewed and are otherwise negative except as noted above.  Physical Exam: Filed Vitals:   12/12/15 0902 12/12/15 0915 12/12/15 0930 12/12/15 1000  BP:  140/52 176/50 148/55  Pulse:  49 30 41  Temp: 98.3 F (36.8 C)     TempSrc: Rectal     Resp:  16 15 13   Height:      Weight:      SpO2:  98% 99% 100%    GEN- The patient is elderly appearing, lethargic, arouses to verbal stimuli HEENT: normocephalic, atraumatic; sclera clear, conjunctiva pink; hearing intact; oropharynx clear; neck supple  Lungs- Clear to ausculation bilaterally, normal  work of breathing.  No wheezes, rales, rhonchi Heart- Bradycardic regular rate and rhythm  GI- soft, non-tender, non-distended, bowel sounds present  Extremities- no clubbing, cyanosis, or edema; DP/PT/radial pulses 2+ bilaterally MS- no significant deformity or atrophy Skin- warm and dry, no rash or lesion Psych- euthymic mood, full affect Neuro- strength and sensation are intact  Labs:   Lab Results  Component Value Date   WBC 4.9 12/12/2015   HGB 11.6* 12/12/2015   HCT 34.0* 12/12/2015   MCV 97.9 12/12/2015   PLT 95* 12/12/2015    Recent Labs Lab 12/12/15 0904 12/12/15 0906  NA 137 136  K 5.1 4.9  CL 100* 100*  CO2 20*  --   BUN 91* 90*  CREATININE 11.53* 11.20*  CALCIUM 9.6  --   GLUCOSE 217* 211*      Radiology/Studies: Dg Chest Portable 1 View 12/12/2015  CLINICAL DATA:  82 year old male with 2 days of shortness of breath and weakness. Missed dialysis. Initial encounter. EXAM: PORTABLE CHEST 1 VIEW COMPARISON:  11/27/2015 and earlier. FINDINGS: Portable AP semi upright view at 0931 hours. Stable tunneled type right chest dialysis catheter. Mildly lower lung volumes. Slightly increased pulmonary vascularity but no acute edema. No pneumothorax, pleural effusion or confluent pulmonary  opacity. Stable cardiac size and mediastinal contours. Visualized tracheal air column is within normal limits. IMPRESSION: No acute cardiopulmonary abnormality; slightly increased pulmonary vascularity without acute edema Electronically Signed   By: Odessa Fleming M.D.   On: 12/12/2015 09:41   RTM:[id number] rhythm, 33, narrow QRS   Assessment/Plan: 1.  Symptomatic junctional bradycardia The patient has symptomatic bradycardia with no reversible causes identified.  PPM implantation is recommended at this time. Risks, benefits discussed with patient, wife, and son who agree with plan. With HD, he is at increased risk for infection, but I do not think that we can avoid PPM at this time.  Nella Botsford plan for later this afternoon with Dr Elberta Fortis Zoll pads on patient for now He has bilateral HD access but is currently dialyzed through right upper chest. Per Dr Adele Dan note, previous right arm graft has failed. Left arm graft is not currently usable. With right upper chest access, Karell Tukes have to implant PPM on left side.  Update echo  2.  HTN Re-evaluate once PPM implanted  3.  ESRD on HD Management per renal     Signed, Gypsy Balsam, NP 12/12/2015 10:32 AM  I have seen and examined this patient with Gypsy Balsam.  Agree with above, note added to reflect my findings.  On exam, regular rhythm, no murmurs, bradycardic, lungs clear.  Presented "feeling bad" and found to have junctional bradycardia into the 30s.  Discussed pacemaker placement with him and his wife.  Discussed risks including bleeding, infection, tamponade, pneumothorax.  Patient and wife understand these risks and have agreed to the procedure.  The patient's son has also been contacted and the procedure discussed with him.    Perline Awe M. Sadae Arrazola MD 12/12/2015 12:33 PM

## 2015-12-12 NOTE — Progress Notes (Signed)
  Echocardiogram 2D Echocardiogram has been performed.  Nolon Rod 12/12/2015, 1:43 PM

## 2015-12-12 NOTE — ED Notes (Signed)
IV team at bedside 

## 2015-12-12 NOTE — ED Notes (Signed)
Pt brought in EMS from home for AMS that was discovered this morning.  EMS reports that pt was normal when going to bed last night.  Pt is in a junctional rhythm at a rate of 35.  Pickering MD made aware of pt and at bedside at this time.  Pt responsive to verbal stimuli.

## 2015-12-12 NOTE — ED Notes (Signed)
Echo at bedside

## 2015-12-12 NOTE — H&P (Signed)
Patient Demographics  [name], is a 82 y.o. male  MRN: [id number]   DOB - 11-Jul-1932  Admit Date - 12/12/2015  Outpatient Primary MD for the patient is [name], [name], DO   With History of -  Past Medical History  Diagnosis Date  . Hypertension   . Diabetes mellitus without complication (HCC)   . Arthritis   . History of blood transfusion   . GI bleed   . ESRD (end stage renal disease) (HCC)   . Seizure (HCC)   . Dialysis patient (HCC)   . Subdural hematoma (HCC) 05/2015    from fall  . Sleep apnea     wears CPAP n/c  . Pulmonary hypertension (HCC)     severe pulmonary HTN by 01/14/13 echo      Past Surgical History  Procedure Laterality Date  . Esophagogastroduodenoscopy  10/03/2012    Procedure: ESOPHAGOGASTRODUODENOSCOPY (EGD);  Surgeon: Theda Belfast, MD;  Location: [facility] ENDOSCOPY;  Service: Endoscopy;  Laterality: N/A;  . Colonoscopy  10/04/2012    Procedure: COLONOSCOPY;  Surgeon: Theda Belfast, MD;  Location: [facility] ENDOSCOPY;  Service: Endoscopy;  Laterality: N/A;  . Colonoscopy with esophagogastroduodenoscopy (egd) Left 11/30/2012    Procedure: COLONOSCOPY WITH ESOPHAGOGASTRODUODENOSCOPY (EGD);  Surgeon: Theda Belfast, MD;  Location: [facility] ENDOSCOPY;  Service: Endoscopy;  Laterality: Left;  . Spine surgery  2004    back fusion/ [facility] Penumalli,MD  . Retinal laser procedure Right 04/2015  . Av fistula placement Left 09/09/2015    Procedure: INSERTION OF ARTERIOVENOUS  GORE-TEX GRAFT Left  ARM;  Surgeon: Sherren Kerns, MD;  Location: [facility] OR;  Service: Vascular;  Laterality: Left;  . Insertion of dialysis catheter Right 11/27/2015    Procedure: INSERTION OF DIALYSIS CATHETER - Right Internal Jugular Placement;  Surgeon: Chuck Hint, MD;  Location: [facility] OR;  Service: Vascular;  Laterality: Right;    in for   Chief Complaint  Patient presents with  . Altered Mental Status     HPI  [name]  is a 82 y.o. male, past medical history that includes chronic  kidney disease on dialysis, seizure disorder, traumatic subdural hematoma, diabetes on insulin, hypertension, anemia presents to the emergency department for AMS. Initial evaluation in the emergency department reveals Junctional rhythm, with a heart rate in low 30s, patient reports he missed his  dialysis last Wednesday, patient with recent hospitalization significant for bradycardia with junctional rhythm, thought to be secondary to hyperkalemia, bradycardia has resolved with dialysis, patient was thought not to be a pacemaker candidate. AP in the past ,patient potassium is 5.1 today, denies any focal deficits, but confused as per family over the last 2 days, as well with generalized weakness, afebrile, no leukocytosis, chest x-ray with no acute finding, denies chest pain, dyspnea, nausea or vomiting.   Review of Systems    In addition to the HPI above,  No Fever-chills, No Headache, No changes with Vision or hearing, No problems swallowing food or Liquids, No Chest pain, Cough or Shortness of Breath, No Abdominal pain, No Nausea or Vommitting, Bowel movements are regular, No Blood in stool or Urine, No dysuria, No new skin rashes or bruises, No new joints pains-aches,  Patient with generalized weakness, but nothing focal No recent weight gain or loss, No polyuria, polydypsia or polyphagia,   A full 10 point Review of Systems was done, except as stated above, all other Review of Systems were negative.   Social History Social History  Substance Use Topics  . Smoking status: Never Smoker   . Smokeless tobacco: Never Used  . Alcohol Use: No     Family History Family History  Problem Relation Age of Onset  . Sudden death Mother   . Sudden death Father     Prior to Admission medications   Medication Sig Start Date End Date Taking? Authorizing Provider  amLODipine (NORVASC) 10 MG tablet Take 10 mg by mouth daily.   Yes Historical Provider, MD  buprenorphine (BUTRANS) 5 MCG/HR  PTWK patch Place 1 patch (5 mcg total) onto the skin once a week. 12/05/15  Yes [name] L [name], DO  calcium acetate (PHOSLO) 667 MG capsule Take 3 capsules by mouth three times a day 07/15/15  Yes Sharon Seller, NP  cinacalcet (SENSIPAR) 30 MG tablet Take 1 tablet (30 mg total) by mouth daily with supper. Patient taking differently: Take 60 mg by mouth daily with supper.  09/18/15  Yes Vassie Loll, MD  colchicine 0.6 MG tablet Take 1 tablet (0.6 mg total) by mouth as needed. For gout. Patient needs an appointment before anymore refills. 09/29/15  Yes [name] L [name], DO  divalproex (DEPAKOTE ER) 250 MG 24 hr tablet Take 250 mg by mouth every 12 (twelve) hours.   Yes Historical Provider, MD  fexofenadine (ALLEGRA) 180 MG tablet Take 180 mg by mouth daily as needed for allergies or rhinitis.   Yes Historical Provider, MD  folic acid (FOLVITE) 1 MG tablet Take 1 tablet (1 mg total) by mouth daily. 11/07/15  Yes [name] L [name], DO  gabapentin (NEURONTIN) 100 MG capsule Take 1 capsule (100 mg total) by mouth daily. Patient taking differently: Take 100 mg by mouth 3 (three) times daily as needed (pain).  05/23/15  Yes Lonia Blood, MD  HUMALOG KWIKPEN 100 UNIT/ML KiwkPen Inject 0-0.1 mLs (0-10 Units total) into the skin as needed (prn based on blood sugar reading). Sliding Scale 12/05/15  Yes [name] L [name], DO  insulin aspart protamine- aspart (NOVOLOG MIX 70/30) (70-30) 100 UNIT/ML injection Inject 0-10 Units into the skin 3 (three) times daily.   Yes Historical Provider, MD  lacosamide (VIMPAT) 50 MG TABS tablet 150mg  BID on M-W-F (dialysis days); 100mg  BID on Tue-Thu-Sat-Sun (non-dialysis days) Patient taking differently: Take 100 mg by mouth 2 (two) times daily. 150mg  BID on M-W-F (dialysis days); 100mg  BID on Tue-Thu-Sat-Sun (non-dialysis days) 11/13/15  Yes , MD  multivitamin (RENA-VIT) TABS tablet Take 1 tablet by mouth at bedtime. 09/29/13  Yes Jessica U Vann, DO  pantoprazole  (PROTONIX) 40 MG tablet Take 1 tablet (40 mg total) by mouth 2 (two) times daily. 10/27/15  Yes 01/13/16, MD  sennosides-docusate sodium (SENOKOT-S) 8.6-50 MG tablet Take 1 tablet by mouth at bedtime as needed for constipation.   Yes Historical Provider, MD  calcitRIOL (ROCALTROL) 0.5 MCG capsule Take 2 capsules (1 mcg total) by mouth every Monday, Wednesday, and Friday with hemodialysis. 05/23/15   Tuesday, MD  darbepoetin [facility]) 60 MCG/0.3ML SOLN injection Inject 0.3 mLs (60 mcg total) into the vein every Thursday with hemodialysis. 09/29/13   Lonia Blood, DO    Allergies  Allergen Reactions  . Aspirin Other (See Comments)    Bleeding   . Lactose Intolerance (Gi) Diarrhea    Physical Exam  Vitals  Blood pressure 164/48, pulse 26, temperature 98.3 F (36.8 C), temperature source Rectal, resp. rate 10, height 6\' 1"  (1.854 m), weight 88.451 kg (195 lb), SpO2  95 %.   1. General Frail elderly male lying in bed in NAD,   2. Awake Alert, Oriented X 1.  3. No F.N deficits, ALL C.Nerves Intact, Strength 5/5 all 4 extremities, Sensation intact all 4 extremities, Plantars down going.  4. Ears and Eyes appear Normal, Conjunctivae clear, PERRLA. Moist Oral Mucosa.  5. Supple Neck, No JVD, No cervical lymphadenopathy appriciated, No Carotid Bruits.  6. Symmetrical Chest wall movement, Good air movement bilaterally, patients with permacath and right chest.  7. Bradycardic, No Gallops, Rubs or Murmurs, No Parasternal Heave.  8. Positive Bowel Sounds, Abdomen Soft, No tenderness, No organomegaly appriciated,No rebound -guarding or rigidity.  9.  No Cyanosis, Normal Skin Turgor, No Skin Rash or Bruise.  10. Good muscle tone,  joints appear normal , patient with significant left arm swelling.    Data Review  CBC  Recent Labs Lab 12/12/15 0904 12/12/15 0906  WBC 4.9  --   HGB 10.7* 11.6*  HCT 32.7* 34.0*  PLT 95*  --   MCV 97.9  --   MCH 32.0  --   MCHC  32.7  --   RDW 15.9*  --   LYMPHSABS 1.1  --   MONOABS 0.3  --   EOSABS 0.1  --   BASOSABS 0.0  --    ------------------------------------------------------------------------------------------------------------------  Chemistries   Recent Labs Lab 12/12/15 0904 12/12/15 0906  NA 137 136  K 5.1 4.9  CL 100* 100*  CO2 20*  --   GLUCOSE 217* 211*  BUN 91* 90*  CREATININE 11.53* 11.20*  CALCIUM 9.6  --    ------------------------------------------------------------------------------------------------------------------ estimated creatinine clearance is 5.8 mL/min (by C-G formula based on Cr of 11.2). ------------------------------------------------------------------------------------------------------------------ No results for input(s): TSH, T4TOTAL, T3FREE, THYROIDAB in the last 72 hours.  Invalid input(s): FREET3   Coagulation profile No results for input(s): INR, PROTIME in the last 168 hours. ------------------------------------------------------------------------------------------------------------------- No results for input(s): DDIMER in the last 72 hours. -------------------------------------------------------------------------------------------------------------------  Cardiac Enzymes No results for input(s): CKMB, TROPONINI, MYOGLOBIN in the last 168 hours.  Invalid input(s): CK ------------------------------------------------------------------------------------------------------------------ Invalid input(s): POCBNP   ---------------------------------------------------------------------------------------------------------------  Urinalysis    Component Value Date/Time   COLORURINE YELLOW 10/23/2015 1400   APPEARANCEUR CLEAR 10/23/2015 1400   LABSPEC 1.011 10/23/2015 1400   PHURINE 7.5 10/23/2015 1400   GLUCOSEU 250* 10/23/2015 1400   HGBUR MODERATE* 10/23/2015 1400   BILIRUBINUR NEGATIVE 10/23/2015 1400   KETONESUR NEGATIVE 10/23/2015 1400   PROTEINUR  100* 10/23/2015 1400   UROBILINOGEN 0.2 06/20/2015 0845   NITRITE NEGATIVE 10/23/2015 1400   LEUKOCYTESUR NEGATIVE 10/23/2015 1400    ----------------------------------------------------------------------------------------------------------------  Imaging results:   Dg Chest Portable 1 View  12/12/2015  CLINICAL DATA:  82 year old male with 2 days of shortness of breath and weakness. Missed dialysis. Initial encounter. EXAM: PORTABLE CHEST 1 VIEW COMPARISON:  11/27/2015 and earlier. FINDINGS: Portable AP semi upright view at 0931 hours. Stable tunneled type right chest dialysis catheter. Mildly lower lung volumes. Slightly increased pulmonary vascularity but no acute edema. No pneumothorax, pleural effusion or confluent pulmonary opacity. Stable cardiac size and mediastinal contours. Visualized tracheal air column is within normal limits. IMPRESSION: No acute cardiopulmonary abnormality; slightly increased pulmonary vascularity without acute edema Electronically Signed   By: Odessa Fleming M.D.   On: 12/12/2015 09:41    My personal review of EKG: Junctional rhythm, Rate   /min, QTc 503, no Acute ST changes    Assessment & Plan  Active Problems:   Hypertension   Anemia  in chronic kidney disease   ESRD on dialysis [facility])   Seizure disorder (HCC)   Chronic diastolic congestive heart failure (HCC)   Controlled type 2 diabetes mellitus with diabetic nephropathy, without long-term current use of insulin (HCC)   AV junctional bradycardia   Altered mental state  Altered mental status - Patient had similar presentation during last admission, encephalopathy resolved with resolution of bradycardia, so most likely bradycardia is the cause of his altered mental status, as well patient is with significantly elevated BUN, may be mild uremia contributing as well.  Junctional bradycardia - Patient seen in the past by EP, felt to be not a candidate for pacemaker, vital signs are stable, cardiology consulted  by ED, patient with hyperkalemia in the past contributing to transfer bradycardia, but currently potassium within normal limit, will admit to stepdown, vital signs stable, will have transcutaneous pacer at bedside as a precautionary measure.  End-stage renal DISEASE - Nephrology consulted, will receive hemodialysis today  Left arm swelling - Will check venous Doppler  Chronic diastolic heart failure - Volume management with hemodialysis  Diabetes mellitus - Will hold home insulin regimen, will keep on insulin sliding scale  Hypertension - Blood pressure uncontrolled, will continue with Norvasc  Seizure disorder - Continue with home medication  Thrombocytopenia - At baseline, will continue with a CD4 DVT prophylaxis       DVT SCD  AM Labs Ordered, also please review Full Orders  Family Communication: Admission, patients condition and plan of care including tests being ordered have been discussed with the patient and wife and son who indicate understanding and agree with the plan and Code Status.  Code Status DO NOT RESUSCITATE, confirmed by son at bedside  Condition GUARDED  guarded  Time spent in minutes : 60 minutes    Marguetta Windish M.D on 12/12/2015 at 11:09 AM  Between 7am to 7pm - Pager - [phone]  After 7pm go to [email or web address] - password [email or web address]  And look for the night coverage person covering me after hours  [facility] Office  [phone]

## 2015-12-12 NOTE — ED Provider Notes (Signed)
CSN: [id number]     Arrival date & time 12/12/15  0830 History   First MD Initiated Contact with Patient 12/12/15 [phone]     Chief Complaint  Patient presents with  . Altered Mental Status     (Consider location/radiation/quality/duration/timing/severity/associated sxs/prior Treatment) HPI  This is an 82 year old male with a history of end-stage renal disease,junctional bradycardia, diabeteswho is brought in by EMS for altered mental status. This is a level V caveat due to patient condition and altered mental status.  Patient apparently missed t least one  Day of dialysis. E awoke this morning confused. EMS was called to the house. The patient was recently admitted to the hospital and discharged on 11/28/2015 for bradycardia. He apparently goes into bradycardia with hyperkalemia. No pacemaker placed at that time.   Past Medical History  Diagnosis Date  . Hypertension   . Diabetes mellitus without complication (HCC)   . Arthritis   . History of blood transfusion   . GI bleed   . ESRD (end stage renal disease) (HCC)   . Seizure (HCC)   . Dialysis patient (HCC)   . Subdural hematoma (HCC) 05/2015    from fall  . Sleep apnea     wears CPAP n/c  . Pulmonary hypertension (HCC)     severe pulmonary HTN by 01/14/13 echo   Past Surgical History  Procedure Laterality Date  . Esophagogastroduodenoscopy  10/03/2012    Procedure: ESOPHAGOGASTRODUODENOSCOPY (EGD);  Surgeon: Theda Belfast, MD;  Location: [facility] ENDOSCOPY;  Service: Endoscopy;  Laterality: N/A;  . Colonoscopy  10/04/2012    Procedure: COLONOSCOPY;  Surgeon: Theda Belfast, MD;  Location: [facility] ENDOSCOPY;  Service: Endoscopy;  Laterality: N/A;  . Colonoscopy with esophagogastroduodenoscopy (egd) Left 11/30/2012    Procedure: COLONOSCOPY WITH ESOPHAGOGASTRODUODENOSCOPY (EGD);  Surgeon: Theda Belfast, MD;  Location: [facility] ENDOSCOPY;  Service: Endoscopy;  Laterality: Left;  . Spine surgery  2004    back fusion/ [facility] Penumalli,MD  . Retinal laser  procedure Right 04/2015  . Av fistula placement Left 09/09/2015    Procedure: INSERTION OF ARTERIOVENOUS  GORE-TEX GRAFT Left  ARM;  Surgeon: Sherren Kerns, MD;  Location: [facility] OR;  Service: Vascular;  Laterality: Left;  . Insertion of dialysis catheter Right 11/27/2015    Procedure: INSERTION OF DIALYSIS CATHETER - Right Internal Jugular Placement;  Surgeon: Chuck Hint, MD;  Location: [facility] OR;  Service: Vascular;  Laterality: Right;   Family History  Problem Relation Age of Onset  . Sudden death Mother   . Sudden death Father    Social History  Substance Use Topics  . Smoking status: Never Smoker   . Smokeless tobacco: Never Used  . Alcohol Use: No    Review of Systems  Ten systems reviewed and are negative for acute change, except as noted in the HPI.    Allergies  Aspirin and Lactose intolerance (gi)  Home Medications   Prior to Admission medications   Medication Sig Start Date End Date Taking? Authorizing Provider  amLODipine (NORVASC) 10 MG tablet Take 10 mg by mouth daily.   Yes Historical Provider, MD  buprenorphine (BUTRANS) 5 MCG/HR PTWK patch Place 1 patch (5 mcg total) onto the skin once a week. 12/05/15  Yes [name] L [name], DO  calcium acetate (PHOSLO) 667 MG capsule Take 3 capsules by mouth three times a day 07/15/15  Yes Sharon Seller, NP  cinacalcet (SENSIPAR) 30 MG tablet Take 1 tablet (30 mg total) by mouth daily with supper.  Patient taking differently: Take 60 mg by mouth daily with supper.  09/18/15  Yes Vassie Loll, MD  colchicine 0.6 MG tablet Take 1 tablet (0.6 mg total) by mouth as needed. For gout. Patient needs an appointment before anymore refills. 09/29/15  Yes [name] L [name], DO  divalproex (DEPAKOTE ER) 250 MG 24 hr tablet Take 250 mg by mouth every 12 (twelve) hours.   Yes Historical Provider, MD  fexofenadine (ALLEGRA) 180 MG tablet Take 180 mg by mouth daily as needed for allergies or rhinitis.   Yes Historical Provider, MD  folic acid  (FOLVITE) 1 MG tablet Take 1 tablet (1 mg total) by mouth daily. 11/07/15  Yes [name] L [name], DO  gabapentin (NEURONTIN) 100 MG capsule Take 1 capsule (100 mg total) by mouth daily. Patient taking differently: Take 100 mg by mouth 3 (three) times daily as needed (pain).  05/23/15  Yes Lonia Blood, MD  HUMALOG KWIKPEN 100 UNIT/ML KiwkPen Inject 0-0.1 mLs (0-10 Units total) into the skin as needed (prn based on blood sugar reading). Sliding Scale 12/05/15  Yes [name] L [name], DO  insulin aspart protamine- aspart (NOVOLOG MIX 70/30) (70-30) 100 UNIT/ML injection Inject 0-10 Units into the skin 3 (three) times daily.   Yes Historical Provider, MD  lacosamide (VIMPAT) 50 MG TABS tablet 150mg  BID on M-W-F (dialysis days); 100mg  BID on Tue-Thu-Sat-Sun (non-dialysis days) Patient taking differently: Take 100 mg by mouth 2 (two) times daily. 150mg  BID on M-W-F (dialysis days); 100mg  BID on Tue-Thu-Sat-Sun (non-dialysis days) 11/13/15  Yes , MD  multivitamin (RENA-VIT) TABS tablet Take 1 tablet by mouth at bedtime. 09/29/13  Yes Jessica U Vann, DO  pantoprazole (PROTONIX) 40 MG tablet Take 1 tablet (40 mg total) by mouth 2 (two) times daily. 10/27/15  Yes 01/13/16, MD  sennosides-docusate sodium (SENOKOT-S) 8.6-50 MG tablet Take 1 tablet by mouth at bedtime as needed for constipation.   Yes Historical Provider, MD  calcitRIOL (ROCALTROL) 0.5 MCG capsule Take 2 capsules (1 mcg total) by mouth every Monday, Wednesday, and Friday with hemodialysis. 05/23/15   Saturday, MD  darbepoetin [facility]) 60 MCG/0.3ML SOLN injection Inject 0.3 mLs (60 mcg total) into the vein every Thursday with hemodialysis. 09/29/13   Jessica U Vann, DO   BP 164/48 mmHg  Pulse 26  Temp(Src) 98.3 F (36.8 C) (Rectal)  Resp 10  Ht 6\' 1"  (1.854 m)  Wt 88.451 kg  BMI 25.73 kg/m2  SpO2 95% Physical Exam  Constitutional: He appears well-developed and well-nourished. No distress.  HENT:  Head:  Atraumatic.  Eyes: Conjunctivae are normal.  Neck: No JVD present.  Cardiovascular: Regular rhythm and normal heart sounds.   AV fistula of the right upper extremity without thrill  Pulmonary/Chest: Effort normal and breath sounds normal. No respiratory distress. He has no rales.  Triple lumen catheter in the right upper chest  Abdominal: Soft. Bowel sounds are normal. He exhibits no distension. There is no tenderness.  Musculoskeletal: Normal range of motion.  Neurological:  Responds to voice, answers yes and no questions confused  Skin:  cool Extremities  Nursing note and vitals reviewed.   ED Course  Procedures (including critical care time) Labs Review Labs Reviewed  CBC WITH DIFFERENTIAL/PLATELET - Abnormal; Notable for the following:    RBC 3.34 (*)    Hemoglobin 10.7 (*)    HCT 32.7 (*)    RDW 15.9 (*)    Platelets 95 (*)    All other components  within normal limits  BASIC METABOLIC PANEL - Abnormal; Notable for the following:    Chloride 100 (*)    CO2 20 (*)    Glucose, Bld 217 (*)    BUN 91 (*)    Creatinine, Ser 11.53 (*)    GFR calc non Af Amer 4 (*)    GFR calc Af Amer 4 (*)    Anion gap 17 (*)    All other components within normal limits  I-STAT CHEM 8, ED - Abnormal; Notable for the following:    Chloride 100 (*)    BUN 90 (*)    Creatinine, Ser 11.20 (*)    Glucose, Bld 211 (*)    Hemoglobin 11.6 (*)    HCT 34.0 (*)    All other components within normal limits  CBG MONITORING, ED - Abnormal; Notable for the following:    Glucose-Capillary 191 (*)    All other components within normal limits  I-STAT TROPOININ, ED    Imaging Review Dg Chest Portable 1 View  12/12/2015  CLINICAL DATA:  82 year old male with 2 days of shortness of breath and weakness. Missed dialysis. Initial encounter. EXAM: PORTABLE CHEST 1 VIEW COMPARISON:  11/27/2015 and earlier. FINDINGS: Portable AP semi upright view at 0931 hours. Stable tunneled type right chest dialysis  catheter. Mildly lower lung volumes. Slightly increased pulmonary vascularity but no acute edema. No pneumothorax, pleural effusion or confluent pulmonary opacity. Stable cardiac size and mediastinal contours. Visualized tracheal air column is within normal limits. IMPRESSION: No acute cardiopulmonary abnormality; slightly increased pulmonary vascularity without acute edema Electronically Signed   By: Odessa Fleming M.D.   On: 12/12/2015 09:41   I have personally reviewed and evaluated these images and lab results as part of my medical decision-making.   EKG Interpretation   Date/Time:  Friday December 12 2015 08:31:26 EDT Ventricular Rate:  33 PR Interval:    QRS Duration: 91 QT Interval:  679 QTC Calculation: 503 R Axis:   41 Text Interpretation:  Junctional rhythm Prolonged QT interval Baseline  wander in lead(s) II No significant change since last tracing Confirmed by  Rubin Payor  MD, Harrold Donath [phone]) on 12/12/2015 8:48:19 AM Also confirmed by  Rubin Payor  MD, NATHAN [phone]), editor Dan Humphreys, CCT, SANDRA ([id number])  on  12/12/2015 9:59:43 AM      MDM   Final diagnoses:  Symptomatic bradycardia  Encephalopathy  Uremia  Increased anion gap metabolic acidosis   BP 164/48 mmHg  Pulse 26  Temp(Src) 98.3 F (36.8 C) (Rectal)  Resp 10  Ht 6\' 1"  (1.854 m)  Wt 88.451 kg  BMI 25.73 kg/m2  SpO2 95%  Patient with junctional bradycardia He is hypertensive, afebrile. Patient is somnolent but responds appropriately to voice and is easily aroused.his blood work shows an anion gap metabolic acidosis, likely secondary to his uremia. Patient did miss dialysis. His potassium is normal. His son and wife are here and state that he has been confused for about.a day and a half now.He has not been running fevers or chills at home  The patient does have some swelling in the left arm after recent procedure to his AV graft in the left upper extremity. He does have a palpable thrill in that region. Does not appear to be  cellulitic.    Patient will be admitted by Dr.Elgergawy for symptomatic bradycardia and encephalopathy. Dr. is aware of the patient and he will be dialyzed while inpatient. Cardiology will consult on the patient for his junctional rhythm.  He is stable here in the emergency department without significant decline in his vital signs were mental status.   Arthor Captain, PA-C 12/12/15 1101  Benjiman Core, MD 12/12/15 1253

## 2015-12-12 NOTE — Progress Notes (Addendum)
Surgical incision present on right upper chest, post permanent pacemaker implantation; not visualized due to bulky dressing in place. Two small surgical incisions also present on left upper arm with sutures in situ. No apparent issues related to either of these incisions.  Patient has extensive, healed skin graft site present on his right lower extremity that could present a risk factor for skin issues if not protected. Donor sites on anterior right thigh are completely healed.  Patient is incontinent of bowel and bladder; will require vigilance to maintain skin integrity. No other exceptions noted on skin assessment.

## 2015-12-13 ENCOUNTER — Inpatient Hospital Stay (HOSPITAL_COMMUNITY): Payer: Medicare Other

## 2015-12-13 DIAGNOSIS — N186 End stage renal disease: Secondary | ICD-10-CM

## 2015-12-13 DIAGNOSIS — Z992 Dependence on renal dialysis: Secondary | ICD-10-CM

## 2015-12-13 DIAGNOSIS — R4182 Altered mental status, unspecified: Secondary | ICD-10-CM

## 2015-12-13 DIAGNOSIS — G40909 Epilepsy, unspecified, not intractable, without status epilepticus: Secondary | ICD-10-CM

## 2015-12-13 DIAGNOSIS — R609 Edema, unspecified: Secondary | ICD-10-CM

## 2015-12-13 LAB — COMPREHENSIVE METABOLIC PANEL
ALT: 8 U/L — AB (ref 17–63)
AST: 17 U/L (ref 15–41)
Albumin: 3.3 g/dL — ABNORMAL LOW (ref 3.5–5.0)
Alkaline Phosphatase: 48 U/L (ref 38–126)
Anion gap: 16 — ABNORMAL HIGH (ref 5–15)
BUN: 38 mg/dL — AB (ref 6–20)
CHLORIDE: 100 mmol/L — AB (ref 101–111)
CO2: 23 mmol/L (ref 22–32)
CREATININE: 6.82 mg/dL — AB (ref 0.61–1.24)
Calcium: 8.8 mg/dL — ABNORMAL LOW (ref 8.9–10.3)
GFR calc Af Amer: 8 mL/min — ABNORMAL LOW (ref >60)
GFR calc non Af Amer: 7 mL/min — ABNORMAL LOW (ref >60)
GLUCOSE: 155 mg/dL — AB (ref 65–99)
Potassium: 4 mmol/L (ref 3.5–5.1)
SODIUM: 139 mmol/L (ref 135–145)
Total Bilirubin: 0.7 mg/dL (ref 0.3–1.2)
Total Protein: 6.4 g/dL — ABNORMAL LOW (ref 6.5–8.1)

## 2015-12-13 LAB — MRSA PCR SCREENING: MRSA by PCR: NEGATIVE

## 2015-12-13 LAB — GLUCOSE, CAPILLARY
GLUCOSE-CAPILLARY: 144 mg/dL — AB (ref 65–99)
Glucose-Capillary: 141 mg/dL — ABNORMAL HIGH (ref 65–99)
Glucose-Capillary: 205 mg/dL — ABNORMAL HIGH (ref 65–99)

## 2015-12-13 LAB — CBC
HCT: 32 % — ABNORMAL LOW (ref 39.0–52.0)
Hemoglobin: 10.4 g/dL — ABNORMAL LOW (ref 13.0–17.0)
MCH: 31.2 pg (ref 26.0–34.0)
MCHC: 32.5 g/dL (ref 30.0–36.0)
MCV: 96.1 fL (ref 78.0–100.0)
PLATELETS: 78 10*3/uL — AB (ref 150–400)
RBC: 3.33 MIL/uL — ABNORMAL LOW (ref 4.22–5.81)
RDW: 15.3 % (ref 11.5–15.5)
WBC: 2.4 10*3/uL — ABNORMAL LOW (ref 4.0–10.5)

## 2015-12-13 MED ORDER — CEFAZOLIN SODIUM 1-5 GM-% IV SOLN
1.0000 g | Freq: Once | INTRAVENOUS | Status: AC
  Administered 2015-12-13: 1 g via INTRAVENOUS
  Filled 2015-12-13: qty 50

## 2015-12-13 MED ORDER — OXYCODONE HCL 5 MG PO TABS
5.0000 mg | ORAL_TABLET | Freq: Four times a day (QID) | ORAL | Status: DC | PRN
  Administered 2015-12-13 (×2): 5 mg via ORAL
  Filled 2015-12-13 (×2): qty 1

## 2015-12-13 NOTE — Progress Notes (Signed)
VASCULAR LAB PRELIMINARY  PRELIMINARY  PRELIMINARY  PRELIMINARY  Left upper extremity venous duplex completed.    Preliminary report:  There is no obvious evidence of DVT or SVT in the visualized veins of the left upper extremity.   Bayley Yarborough, RVT 12/13/2015, 4:50 PM

## 2015-12-13 NOTE — Progress Notes (Signed)
  [name] Progress Note   Subjective: pacemaker R SCV placed yesterday  Filed Vitals:   12/12/15 2212 12/13/15 0326 12/13/15 0450 12/13/15 0756  BP: 167/53 164/58  162/55  Pulse: 82 75  74  Temp: 98.6 F (37 C) 98.7 F (37.1 C)  97.8 F (36.6 C)  TempSrc: Oral Oral  Oral  Resp: 18 18  12   Height: 6' (1.829 m)     Weight: 89.6 kg (197 lb 8.5 oz)  88.7 kg (195 lb 8.8 oz)   SpO2: 100% 100%  100%    Inpatient medications: . amLODipine  10 mg Oral Daily  . [START ON 12/15/2015] calcitRIOL  1 mcg Oral Q M,W,F-HD  . calcium acetate  2,001 mg Oral TID WC  . cinacalcet  30 mg Oral Q supper  . [START ON 12/17/2015] darbepoetin (ARANESP) injection - DIALYSIS  150 mcg Intravenous Q Wed-HD  . divalproex  250 mg Oral Q12H  . ferric gluconate (FERRLECIT/NULECIT) IV  62.5 mg Intravenous Q Fri-HD  . folic acid  1 mg Oral Daily  . insulin aspart  0-9 Units Subcutaneous TID WC  . lacosamide  100 mg Oral BID  . multivitamin  1 tablet Oral QHS  . pantoprazole  40 mg Oral BID  . sodium chloride flush  3 mL Intravenous Q12H     sodium chloride, sodium chloride, acetaminophen, alteplase, heparin, lidocaine (PF), lidocaine-prilocaine, ondansetron (ZOFRAN) IV, pentafluoroprop-tetrafluoroeth, senna-docusate  Exam: Alert, no distress, up in chair No jvd Chest clear bilat RRR no mrg R chest HD cath R chest pacemaker is lateral of TDC exit site in R upper chest Abd soft ntnd no mass or ascites Ext no LE edema, chron LUE edema 3+ Neuro dementia, very poor memory, behavior normal  [facility]  3h 02/16/2016   89.5kg  2/2.25 bath  P4  R IJ TDC (not using L AVF now due to swelling)  Heparin none Venofer 50/wk Mircera 225/ wk last 3/29 Calcitriol 0.75 ug      Assessment: 1. Bradycardia - sp perm pacemaker R SCV 12/12/15 2. ESRD HD last night 3. HTN good BP on norvasc 4. Vol at dry wt 5. LUE edema - off and on success cannulating LUA AVF, has TDC in due to this. 6. Seizure  d/o - stable 7. DM stable 8. Dementia 9. DIspo - per primary , stable from renal standpoint  Plan - no new suggestions.    02/11/16 MD Vinson [name] pager [phone]    cell [phone] 12/13/2015, 9:51 AM    Recent Labs Lab 12/12/15 0904 12/12/15 0906 12/13/15 0543  NA 137 136 139  K 5.1 4.9 4.0  CL 100* 100* 100*  CO2 20*  --  23  GLUCOSE 217* 211* 155*  BUN 91* 90* 38*  CREATININE 11.53* 11.20* 6.82*  CALCIUM 9.6  --  8.8*    Recent Labs Lab 12/13/15 0543  AST 17  ALT 8*  ALKPHOS 48  BILITOT 0.7  PROT 6.4*  ALBUMIN 3.3*    Recent Labs Lab 12/12/15 0904 12/12/15 0906 12/13/15 0543  WBC 4.9  --  2.4*  NEUTROABS 3.5  --   --   HGB 10.7* 11.6* 10.4*  HCT 32.7* 34.0* 32.0*  MCV 97.9  --  96.1  PLT 95*  --  78*

## 2015-12-13 NOTE — Progress Notes (Signed)
SUBJECTIVE: The patient is doing well today.  At this time, he denies chest pain, shortness of breath, or any new concerns.  Dual chamber pacemaker placed yesterday for junctional bradycardia without issues.  In sinus rhythm today.  Marland Kitchen amLODipine  10 mg Oral Daily  . [START ON 12/15/2015] calcitRIOL  1 mcg Oral Q M,W,F-HD  . calcium acetate  2,001 mg Oral TID WC  . cinacalcet  30 mg Oral Q supper  . [START ON 12/17/2015] darbepoetin (ARANESP) injection - DIALYSIS  150 mcg Intravenous Q Wed-HD  . divalproex  250 mg Oral Q12H  . ferric gluconate (FERRLECIT/NULECIT) IV  62.5 mg Intravenous Q Fri-HD  . folic acid  1 mg Oral Daily  . insulin aspart  0-9 Units Subcutaneous TID WC  . lacosamide  100 mg Oral BID  . multivitamin  1 tablet Oral QHS  . pantoprazole  40 mg Oral BID  . sodium chloride flush  3 mL Intravenous Q12H      OBJECTIVE: Physical Exam: Filed Vitals:   12/12/15 2212 12/13/15 0326 12/13/15 0450 12/13/15 0756  BP: 167/53 164/58  162/55  Pulse: 82 75  74  Temp: 98.6 F (37 C) 98.7 F (37.1 C)  97.8 F (36.6 C)  TempSrc: Oral Oral  Oral  Resp: 18 18  12   Height: 6' (1.829 m)     Weight: 197 lb 8.5 oz (89.6 kg)  195 lb 8.8 oz (88.7 kg)   SpO2: 100% 100%  100%    Intake/Output Summary (Last 24 hours) at 12/13/15 0803 Last data filed at 12/13/15 0201  Gross per 24 hour  Intake    170 ml  Output   1300 ml  Net  -1130 ml    Telemetry reveals sinus rhythm  GEN- The patient is well appearing, alert and oriented x 3 today.   Head- normocephalic, atraumatic Eyes-  Sclera clear, conjunctiva pink Ears- hearing intact Oropharynx- clear Neck- supple, no JVP Lymph- no cervical lymphadenopathy Lungs- Clear to ausculation bilaterally, normal work of breathing Heart- Regular rate and rhythm, no murmurs, rubs or gallops, PMI not laterally displaced GI- soft, NT, ND, + BS Extremities- no clubbing, cyanosis, or edema Skin- no rash or lesion, pacemaker site C/D/I Psych-  euthymic mood, full affect Neuro- strength and sensation are intact  LABS: Basic Metabolic Panel:  Recent Labs  02/12/16 0904 12/12/15 0906 12/13/15 0543  NA 137 136 139  K 5.1 4.9 4.0  CL 100* 100* 100*  CO2 20*  --  23  GLUCOSE 217* 211* 155*  BUN 91* 90* 38*  CREATININE 11.53* 11.20* 6.82*  CALCIUM 9.6  --  8.8*   Liver Function Tests:  Recent Labs  12/13/15 0543  AST 17  ALT 8*  ALKPHOS 48  BILITOT 0.7  PROT 6.4*  ALBUMIN 3.3*   No results for input(s): LIPASE, AMYLASE in the last 72 hours. CBC:  Recent Labs  12/12/15 0904 12/12/15 0906 12/13/15 0543  WBC 4.9  --  2.4*  NEUTROABS 3.5  --   --   HGB 10.7* 11.6* 10.4*  HCT 32.7* 34.0* 32.0*  MCV 97.9  --  96.1  PLT 95*  --  78*   Cardiac Enzymes: No results for input(s): CKTOTAL, CKMB, CKMBINDEX, TROPONINI in the last 72 hours. BNP: Invalid input(s): POCBNP D-Dimer: No results for input(s): DDIMER in the last 72 hours. Hemoglobin A1C: No results for input(s): HGBA1C in the last 72 hours. Fasting Lipid Panel: No results for input(s): CHOL, HDL,  LDLCALC, TRIG, CHOLHDL, LDLDIRECT in the last 72 hours. Thyroid Function Tests: No results for input(s): TSH, T4TOTAL, T3FREE, THYROIDAB in the last 72 hours.  Invalid input(s): FREET3 Anemia Panel: No results for input(s): VITAMINB12, FOLATE, FERRITIN, TIBC, IRON, RETICCTPCT in the last 72 hours.  RADIOLOGY: Dg Chest Portable 1 View  12/12/2015  CLINICAL DATA:  82 year old male with 2 days of shortness of breath and weakness. Missed dialysis. Initial encounter. EXAM: PORTABLE CHEST 1 VIEW COMPARISON:  11/27/2015 and earlier. FINDINGS: Portable AP semi upright view at 0931 hours. Stable tunneled type right chest dialysis catheter. Mildly lower lung volumes. Slightly increased pulmonary vascularity but no acute edema. No pneumothorax, pleural effusion or confluent pulmonary opacity. Stable cardiac size and mediastinal contours. Visualized tracheal air column is  within normal limits. IMPRESSION: No acute cardiopulmonary abnormality; slightly increased pulmonary vascularity without acute edema Electronically Signed   By: Odessa Fleming M.D.   On: 12/12/2015 09:41   Dg Chest Port 1 View  11/27/2015  CLINICAL DATA:  Status post dialysis catheter placement EXAM: PORTABLE CHEST 1 VIEW COMPARISON:  11/26/2015 FINDINGS: Cardiac shadow is stable. The lungs are clear. No infiltrate or pneumothorax is seen. A dialysis catheter is noted in satisfactory position. IMPRESSION: No acute abnormality.  No pneumothorax following catheter placement. Electronically Signed   By: Alcide Clever M.D.   On: 11/27/2015 15:59   Dg Chest Portable 1 View  11/26/2015  CLINICAL DATA:  Weakness EXAM: PORTABLE CHEST 1 VIEW COMPARISON:  10/23/2015 FINDINGS: Cardiomediastinal silhouette is stable. No acute infiltrate or pleural effusion. No pulmonary edema. Bony thorax is unremarkable. IMPRESSION: No active disease. Electronically Signed   By: Natasha Mead M.D.   On: 11/26/2015 14:09   Dg Fluoro Guide Cv Line-no Report  11/27/2015  CLINICAL DATA:  FLOURO GUIDE CV LINE Fluoroscopy was utilized by the requesting physician.  No radiographic interpretation.    ASSESSMENT AND PLAN:  Active Problems:   Hypertension   Anemia in chronic kidney disease   ESRD on dialysis [facility])   Seizure disorder (HCC)   Chronic diastolic congestive heart failure (HCC)   Controlled type 2 diabetes mellitus with diabetic nephropathy, without long-term current use of insulin (HCC)   AV junctional bradycardia   Altered mental state Junctional Bradycardia: likely the cause of his feeling bad.  Had pacemaker placed yesterday on the right side as the left upper extremity was occluded.  Discussed risks with family including high risk of infection.  This AM device check without issues, CXR shows device in place without issue.  Would await official CXR read to look for PTX.  Please call back if questions.    Jayesh Marbach Jorja Loa,  MD 12/13/2015 8:03 AM

## 2015-12-13 NOTE — Progress Notes (Signed)
PROGRESS NOTE  [name] WJX:[id number] DOB: Jan 22, 1932 DOA: 12/12/2015 PCP: [name], DO  Assessment/Plan: Altered mental status - Patient had similar presentation during last admission, encephalopathy resolved with resolution of bradycardia, so most likely bradycardia is the cause of his altered mental status, as well patient is with significantly elevated BUN, may be mild uremia contributing as well. -resolved on AM of 4/8  Junctional bradycardia - s/p pacemaker  End-stage renal DISEASE - s/p hemodialysis  Chronic diastolic heart failure - Volume management with hemodialysis  Diabetes mellitus -SSI  Hypertension - Blood pressure uncontrolled, will continue with Norvasc  Seizure disorder - Continue with home medication  Thrombocytopenia - SCDs  Code Status: DNR Family Communication: son Disposition Plan: PT eval-- home with wheelchair??   Consultants:  EP  renal  Procedures:  Pacemaker insertion    HPI/Subjective: Doesn't remember yesterday  Objective: Filed Vitals:   12/13/15 0326 12/13/15 0756  BP: 164/58 162/55  Pulse: 75 74  Temp: 98.7 F (37.1 C) 97.8 F (36.6 C)  Resp: 18 12    Intake/Output Summary (Last 24 hours) at 12/13/15 1141 Last data filed at 12/13/15 1121  Gross per 24 hour  Intake    290 ml  Output   1500 ml  Net  -1210 ml   Filed Weights   12/12/15 2150 12/12/15 2212 12/13/15 0450  Weight: 91.5 kg (201 lb 11.5 oz) 89.6 kg (197 lb 8.5 oz) 88.7 kg (195 lb 8.8 oz)    Exam:   General:  Awake, pleasantly confused  Cardiovascular: rrr  Respiratory: diminished  Abdomen: +BS, soft  Musculoskeletal: right arm in sling   Data Reviewed: Basic Metabolic Panel:  Recent Labs Lab 12/12/15 0904 12/12/15 0906 12/13/15 0543  NA 137 136 139  K 5.1 4.9 4.0  CL 100* 100* 100*  CO2 20*  --  23  GLUCOSE 217* 211* 155*  BUN 91* 90* 38*  CREATININE 11.53* 11.20* 6.82*  CALCIUM 9.6  --  8.8*   Liver Function  Tests:  Recent Labs Lab 12/13/15 0543  AST 17  ALT 8*  ALKPHOS 48  BILITOT 0.7  PROT 6.4*  ALBUMIN 3.3*   No results for input(s): LIPASE, AMYLASE in the last 168 hours. No results for input(s): AMMONIA in the last 168 hours. CBC:  Recent Labs Lab 12/12/15 0904 12/12/15 0906 12/13/15 0543  WBC 4.9  --  2.4*  NEUTROABS 3.5  --   --   HGB 10.7* 11.6* 10.4*  HCT 32.7* 34.0* 32.0*  MCV 97.9  --  96.1  PLT 95*  --  78*   Cardiac Enzymes: No results for input(s): CKTOTAL, CKMB, CKMBINDEX, TROPONINI in the last 168 hours. BNP (last 3 results) No results for input(s): BNP in the last 8760 hours.  ProBNP (last 3 results) No results for input(s): PROBNP in the last 8760 hours.  CBG:  Recent Labs Lab 12/12/15 0856 12/12/15 2328 12/13/15 0754  GLUCAP 191* 121* 141*    Recent Results (from the past 240 hour(s))  MRSA PCR Screening     Status: None   Collection Time: 12/12/15 11:00 PM  Result Value Ref Range Status   MRSA by PCR NEGATIVE NEGATIVE Final    Comment:        The GeneXpert MRSA Assay (FDA approved for NASAL specimens only), is one component of a comprehensive MRSA colonization surveillance program. It is not intended to diagnose MRSA infection nor to guide or monitor treatment for MRSA infections.  Studies: Dg Chest 2 View  12/13/2015  CLINICAL DATA:  82 year old who underwent pacemaker placement yesterday. EXAM: CHEST  2 VIEW COMPARISON:  12/12/2015 and earlier. FINDINGS: Right subclavian dual lead transvenous pacemaker with the lead tips at the expected location of the right atrial appendage and the proximal right ventricle. No pneumothorax. Right jugular dialysis catheter tips project over the upper right atrium and lower SVC, unchanged. Cardiac silhouette mildly enlarged scratch they cardiac silhouette moderately enlarged, unchanged. Lungs clear. Bronchovascular markings normal. Pulmonary vascularity normal. No visible pleural effusions.  Visualized bony thorax intact. IMPRESSION: 1. Right subclavian dual lead transvenous pacemaker without acute complicating features. 2. Stable cardiomegaly.  No acute cardiopulmonary disease. Electronically Signed   By: Hulan Saas M.D.   On: 12/13/2015 09:44   Dg Chest Portable 1 View  12/12/2015  CLINICAL DATA:  82 year old male with 2 days of shortness of breath and weakness. Missed dialysis. Initial encounter. EXAM: PORTABLE CHEST 1 VIEW COMPARISON:  11/27/2015 and earlier. FINDINGS: Portable AP semi upright view at 0931 hours. Stable tunneled type right chest dialysis catheter. Mildly lower lung volumes. Slightly increased pulmonary vascularity but no acute edema. No pneumothorax, pleural effusion or confluent pulmonary opacity. Stable cardiac size and mediastinal contours. Visualized tracheal air column is within normal limits. IMPRESSION: No acute cardiopulmonary abnormality; slightly increased pulmonary vascularity without acute edema Electronically Signed   By: Odessa Fleming M.D.   On: 12/12/2015 09:41    Scheduled Meds: . amLODipine  10 mg Oral Daily  . [START ON 12/15/2015] calcitRIOL  1 mcg Oral Q M,W,F-HD  . calcium acetate  2,001 mg Oral TID WC  . cinacalcet  30 mg Oral Q supper  . [START ON 12/17/2015] darbepoetin (ARANESP) injection - DIALYSIS  150 mcg Intravenous Q Wed-HD  . divalproex  250 mg Oral Q12H  . ferric gluconate (FERRLECIT/NULECIT) IV  62.5 mg Intravenous Q Fri-HD  . folic acid  1 mg Oral Daily  . insulin aspart  0-9 Units Subcutaneous TID WC  . lacosamide  100 mg Oral BID  . multivitamin  1 tablet Oral QHS  . pantoprazole  40 mg Oral BID  . sodium chloride flush  3 mL Intravenous Q12H   Continuous Infusions:  Antibiotics Given (last 72 hours)    Date/Time Action Medication Dose Rate   12/12/15 1554 Given   ceFAZolin (ANCEF) IVPB 2g/100 mL premix 2 g 200 mL/hr   12/12/15 1649 Given   gentamicin (GARAMYCIN) 80 mg in sodium chloride irrigation 0.9 % 500 mL irrigation  80 mg    12/13/15 0201 Given   ceFAZolin (ANCEF) IVPB 1 g/50 mL premix 1 g 100 mL/hr      Active Problems:   Hypertension   Anemia in chronic kidney disease   ESRD on dialysis (HCC)   Seizure disorder (HCC)   Chronic diastolic congestive heart failure (HCC)   Controlled type 2 diabetes mellitus with diabetic nephropathy, without long-term current use of insulin (HCC)   AV junctional bradycardia   Altered mental state    Time spent: 25 min    Avyon Herendeen [facility]  [facility] Pager [phone]. If 7PM-7AM, please contact night-coverage at [email or web address], password [facility] 12/13/2015, 11:41 AM  LOS: 1 day

## 2015-12-13 NOTE — Progress Notes (Signed)
Orthopedic Tech Progress Note Patient Details:  [name] 06-07-1932 [id number]  Ortho Devices Type of Ortho Device: Arm sling Ortho Device/Splint Interventions: Application   Saul Fordyce 12/13/2015, 10:05 AM

## 2015-12-14 DIAGNOSIS — E1121 Type 2 diabetes mellitus with diabetic nephropathy: Secondary | ICD-10-CM

## 2015-12-14 DIAGNOSIS — I5032 Chronic diastolic (congestive) heart failure: Secondary | ICD-10-CM

## 2015-12-14 LAB — HEPATITIS B SURFACE ANTIGEN: HEP B S AG: NEGATIVE

## 2015-12-14 LAB — GLUCOSE, CAPILLARY
GLUCOSE-CAPILLARY: 139 mg/dL — AB (ref 65–99)
GLUCOSE-CAPILLARY: 252 mg/dL — AB (ref 65–99)
GLUCOSE-CAPILLARY: 114 mg/dL — AB (ref 65–99)
Glucose-Capillary: 172 mg/dL — ABNORMAL HIGH (ref 65–99)

## 2015-12-14 MED ORDER — GABAPENTIN 100 MG PO CAPS: 100.0000 mg | ORAL_CAPSULE | Freq: Three times a day (TID) | ORAL | Status: DC | PRN

## 2015-12-14 NOTE — Care Management Note (Signed)
Case Management Note  Patient Details  Name: [name] MRN: [id number] Date of Birth: 1932/05/13  Subjective/Objective:                  Altered mental status Action/Plan: Discharge planning Expected Discharge Date:  12/14/15               Expected Discharge Plan:  [place]  In-House Referral:     Discharge planning Services  CM Consult  Post Acute Care Choice:  Home Health Choice offered to:  Patient, Spouse  DME Arranged:  [employer] worker wheelchair with seat cushion DME Agency:  [place]:  RN, PT, OT, Nurse's Aide [place] Agency:  [place]  Status of Service:  Completed, signed off  Medicare Important Message Given:    Date Medicare IM Given:    Medicare IM give by:    Date Additional Medicare IM Given:    Additional Medicare Important Message give by:     If discussed at [place] of Stay Meetings, dates discussed:    Additional Comments: CM met with pt in room to offer choice and family states [facility] is their home health agency.  Referral called to [place] for HHRN/PT/OT/aide.  Cm called [place] DME rep, Jeneen Rinks to please deliver the wheelchair and cushion to room so pt can discharge.  No other CM needs were communicated. Dellie Catholic, RN 12/14/2015, 1:39 PM

## 2015-12-14 NOTE — Discharge Summary (Signed)
Physician Discharge Summary  [name] JOI:[id number] DOB: 1932-09-03 DOA: 12/12/2015  PCP: [name], DO  Admit date: 12/12/2015 Discharge date: 12/14/2015  Time spent: 35 minutes  Recommendations for Outpatient Follow-up:  Home health--patient refused SNF 24 hour supervision Resume dialysis  Discharge Diagnoses:  Active Problems:   Hypertension   Anemia in chronic kidney disease   ESRD on dialysis [facility])   Seizure disorder (HCC)   Chronic diastolic congestive heart failure (HCC)   Controlled type 2 diabetes mellitus with diabetic nephropathy, without long-term current use of insulin (HCC)   AV junctional bradycardia   Altered mental state   Discharge Condition: improved  Diet recommendation: renal/carb mod  Filed Weights   12/12/15 2212 12/13/15 0450 12/14/15 0423  Weight: 89.6 kg (197 lb 8.5 oz) 88.7 kg (195 lb 8.8 oz) 91 kg (200 lb 9.9 oz)    History of present illness:  [name] is a 82 y.o. male, past medical history that includes chronic kidney disease on dialysis, seizure disorder, traumatic subdural hematoma, diabetes on insulin, hypertension, anemia presents to the emergency department for AMS. Initial evaluation in the emergency department reveals Junctional rhythm, with a heart rate in low 30s, patient reports he missed his dialysis last Wednesday, patient with recent hospitalization significant for bradycardia with junctional rhythm, thought to be secondary to hyperkalemia, bradycardia has resolved with dialysis, patient was thought not to be a pacemaker candidate. AP in the past ,patient potassium is 5.1 today, denies any focal deficits, but confused as per family over the last 2 days, as well with generalized weakness, afebrile, no leukocytosis, chest x-ray with no acute finding, denies chest pain, dyspnea, nausea or vomiting.   Hospital Course:  Altered mental status - Patient had similar presentation during last admission, encephalopathy resolved with  resolution of bradycardia, so most likely bradycardia is the cause of his altered mental status, as well patient is with significantly elevated BUN, may be mild uremia contributing as well. -resolved on AM of 4/8 -does have baseline dementia  Junctional bradycardia - s/p pacemaker  End-stage renal DISEASE - s/p hemodialysis  Chronic diastolic heart failure - Volume management with hemodialysis  Diabetes mellitus -SSI  Hypertension - Blood pressure uncontrolled, will continue with Norvasc  Seizure disorder - Continue with home medication  Procedures:    Consultations:  Renal  EP  Discharge Exam: Filed Vitals:   12/14/15 0423 12/14/15 0755  BP: 186/46 186/52  Pulse:    Temp: 98.1 F (36.7 C)   Resp: 16 14    General: Awake, pleasant but refusing SNF Cardiovascular: rrr Respiratory: clear  Discharge Instructions   Discharge Instructions    Discharge instructions    Complete by:  As directed   Home health ordered DME for wheelchair and cushion placed Renal/carb mod diet 24 hour supervision     Increase activity slowly    Complete by:  As directed           Current Discharge Medication List    CONTINUE these medications which have CHANGED   Details  gabapentin (NEURONTIN) 100 MG capsule Take 1 capsule (100 mg total) by mouth 3 (three) times daily as needed (pain). Qty: 30 capsule, Refills: 0   Associated Diagnoses: Degenerative disc disease, lumbar      CONTINUE these medications which have NOT CHANGED   Details  amLODipine (NORVASC) 10 MG tablet Take 10 mg by mouth daily.    buprenorphine (BUTRANS) 5 MCG/HR PTWK patch Place 1 patch (5 mcg total) onto the skin  once a week. Qty: 4 patch, Refills: 3   Associated Diagnoses: Midline low back pain without sciatica    calcium acetate (PHOSLO) 667 MG capsule Take 3 capsules by mouth three times a day Qty: 180 capsule, Refills: 3    cinacalcet (SENSIPAR) 30 MG tablet Take 1 tablet (30 mg total) by  mouth daily with supper.    colchicine 0.6 MG tablet Take 1 tablet (0.6 mg total) by mouth as needed. For gout. Patient needs an appointment before anymore refills. Qty: 30 tablet, Refills: 0    divalproex (DEPAKOTE ER) 250 MG 24 hr tablet Take 250 mg by mouth every 12 (twelve) hours.    fexofenadine (ALLEGRA) 180 MG tablet Take 180 mg by mouth daily as needed for allergies or rhinitis.    folic acid (FOLVITE) 1 MG tablet Take 1 tablet (1 mg total) by mouth daily. Qty: 30 tablet, Refills: 0    HUMALOG KWIKPEN 100 UNIT/ML KiwkPen Inject 0-0.1 mLs (0-10 Units total) into the skin as needed (prn based on blood sugar reading). Sliding Scale Qty: 15 mL, Refills: 5   Associated Diagnoses: Controlled type 2 diabetes mellitus with diabetic nephropathy, without long-term current use of insulin (HCC)    insulin aspart protamine- aspart (NOVOLOG MIX 70/30) (70-30) 100 UNIT/ML injection Inject 0-10 Units into the skin 3 (three) times daily.    lacosamide (VIMPAT) 50 MG TABS tablet 150mg  BID on M-W-F (dialysis days); 100mg  BID on Tue-Thu-Sat-Sun (non-dialysis days) Qty: 180 tablet, Refills: 5    multivitamin (RENA-VIT) TABS tablet Take 1 tablet by mouth at bedtime. Qty: 30 tablet, Refills: 0    pantoprazole (PROTONIX) 40 MG tablet Take 1 tablet (40 mg total) by mouth 2 (two) times daily. Qty: 60 tablet, Refills: 0    sennosides-docusate sodium (SENOKOT-S) 8.6-50 MG tablet Take 1 tablet by mouth at bedtime as needed for constipation.    calcitRIOL (ROCALTROL) 0.5 MCG capsule Take 2 capsules (1 mcg total) by mouth every Monday, Wednesday, and Friday with hemodialysis.      STOP taking these medications     darbepoetin (ARANESP) 60 MCG/0.3ML SOLN injection        Allergies  Allergen Reactions  . Aspirin Other (See Comments)    Bleeding   . Lactose Intolerance (Gi) Diarrhea   Follow-up Information    Follow up with [name], [name], DO In 1 week.   Specialty:  Geriatric Medicine   Contact  information:   [address] [place] [phone]       Please follow up.   Why:  continue dialysis as instructed by your nephrologist       The results of significant diagnostics from this hospitalization (including imaging, microbiology, ancillary and laboratory) are listed below for reference.    Significant Diagnostic Studies: Dg Chest 2 View  12/13/2015  CLINICAL DATA:  82 year old who underwent pacemaker placement yesterday. EXAM: CHEST  2 VIEW COMPARISON:  12/12/2015 and earlier. FINDINGS: Right subclavian dual lead transvenous pacemaker with the lead tips at the expected location of the right atrial appendage and the proximal right ventricle. No pneumothorax. Right jugular dialysis catheter tips project over the upper right atrium and lower SVC, unchanged. Cardiac silhouette mildly enlarged scratch they cardiac silhouette moderately enlarged, unchanged. Lungs clear. Bronchovascular markings normal. Pulmonary vascularity normal. No visible pleural effusions. Visualized bony thorax intact. IMPRESSION: 1. Right subclavian dual lead transvenous pacemaker without acute complicating features. 2. Stable cardiomegaly.  No acute cardiopulmonary disease. Electronically Signed   By: 94  Lawrence M.D.   On: 12/13/2015 09:44   Dg Chest Portable 1 View  12/12/2015  CLINICAL DATA:  82 year old male with 2 days of shortness of breath and weakness. Missed dialysis. Initial encounter. EXAM: PORTABLE CHEST 1 VIEW COMPARISON:  11/27/2015 and earlier. FINDINGS: Portable AP semi upright view at 0931 hours. Stable tunneled type right chest dialysis catheter. Mildly lower lung volumes. Slightly increased pulmonary vascularity but no acute edema. No pneumothorax, pleural effusion or confluent pulmonary opacity. Stable cardiac size and mediastinal contours. Visualized tracheal air column is within normal limits. IMPRESSION: No acute cardiopulmonary abnormality; slightly increased pulmonary  vascularity without acute edema Electronically Signed   By: Odessa Fleming M.D.   On: 12/12/2015 09:41   Dg Chest Port 1 View  11/27/2015  CLINICAL DATA:  Status post dialysis catheter placement EXAM: PORTABLE CHEST 1 VIEW COMPARISON:  11/26/2015 FINDINGS: Cardiac shadow is stable. The lungs are clear. No infiltrate or pneumothorax is seen. A dialysis catheter is noted in satisfactory position. IMPRESSION: No acute abnormality.  No pneumothorax following catheter placement. Electronically Signed   By: Alcide Clever M.D.   On: 11/27/2015 15:59   Dg Chest Portable 1 View  11/26/2015  CLINICAL DATA:  Weakness EXAM: PORTABLE CHEST 1 VIEW COMPARISON:  10/23/2015 FINDINGS: Cardiomediastinal silhouette is stable. No acute infiltrate or pleural effusion. No pulmonary edema. Bony thorax is unremarkable. IMPRESSION: No active disease. Electronically Signed   By: Natasha Mead M.D.   On: 11/26/2015 14:09   Dg Fluoro Guide Cv Line-no Report  11/27/2015  CLINICAL DATA:  FLOURO GUIDE CV LINE Fluoroscopy was utilized by the requesting physician.  No radiographic interpretation.    Microbiology: Recent Results (from the past 240 hour(s))  MRSA PCR Screening     Status: None   Collection Time: 12/12/15 11:00 PM  Result Value Ref Range Status   MRSA by PCR NEGATIVE NEGATIVE Final    Comment:        The GeneXpert MRSA Assay (FDA approved for NASAL specimens only), is one component of a comprehensive MRSA colonization surveillance program. It is not intended to diagnose MRSA infection nor to guide or monitor treatment for MRSA infections.      Labs: Basic Metabolic Panel:  Recent Labs Lab 12/12/15 0904 12/12/15 0906 12/13/15 0543  NA 137 136 139  K 5.1 4.9 4.0  CL 100* 100* 100*  CO2 20*  --  23  GLUCOSE 217* 211* 155*  BUN 91* 90* 38*  CREATININE 11.53* 11.20* 6.82*  CALCIUM 9.6  --  8.8*   Liver Function Tests:  Recent Labs Lab 12/13/15 0543  AST 17  ALT 8*  ALKPHOS 48  BILITOT 0.7  PROT  6.4*  ALBUMIN 3.3*   No results for input(s): LIPASE, AMYLASE in the last 168 hours. No results for input(s): AMMONIA in the last 168 hours. CBC:  Recent Labs Lab 12/12/15 0904 12/12/15 0906 12/13/15 0543  WBC 4.9  --  2.4*  NEUTROABS 3.5  --   --   HGB 10.7* 11.6* 10.4*  HCT 32.7* 34.0* 32.0*  MCV 97.9  --  96.1  PLT 95*  --  78*   Cardiac Enzymes: No results for input(s): CKTOTAL, CKMB, CKMBINDEX, TROPONINI in the last 168 hours. BNP: BNP (last 3 results) No results for input(s): BNP in the last 8760 hours.  ProBNP (last 3 results) No results for input(s): PROBNP in the last 8760 hours.  CBG:  Recent Labs Lab 12/13/15 0754 12/13/15 1140 12/13/15 2118 12/14/15 5929  12/14/15 1156  GLUCAP 141* 144* 205* 139* 172*       Signed:  Abimbola Aki U Ezriel Boffa DO.  [facility] 12/14/2015, 12:11 PM

## 2015-12-14 NOTE — Progress Notes (Signed)
  [name] Progress Note   Subjective: R chest/ arm hurts, can't get up by himself now.  Looking for SNF now.   Filed Vitals:   12/14/15 0030 12/14/15 0423 12/14/15 0755 12/14/15 1423  BP: 172/52 186/46 186/52 161/73  Pulse: 73   70  Temp: 98 F (36.7 C) 98.1 F (36.7 C)  98.1 F (36.7 C)  TempSrc:  Oral  Oral  Resp: 16 16 14 16   Height:      Weight:  91 kg (200 lb 9.9 oz)    SpO2: 98% 97%      Inpatient medications: . amLODipine  10 mg Oral Daily  . [START ON 12/15/2015] calcitRIOL  1 mcg Oral Q M,W,F-HD  . calcium acetate  2,001 mg Oral TID WC  . cinacalcet  30 mg Oral Q supper  . [START ON 12/17/2015] darbepoetin (ARANESP) injection - DIALYSIS  150 mcg Intravenous Q Wed-HD  . divalproex  250 mg Oral Q12H  . ferric gluconate (FERRLECIT/NULECIT) IV  62.5 mg Intravenous Q Fri-HD  . folic acid  1 mg Oral Daily  . insulin aspart  0-9 Units Subcutaneous TID WC  . lacosamide  100 mg Oral BID  . multivitamin  1 tablet Oral QHS  . pantoprazole  40 mg Oral BID  . sodium chloride flush  3 mL Intravenous Q12H     sodium chloride, sodium chloride, acetaminophen, alteplase, heparin, lidocaine (PF), lidocaine-prilocaine, ondansetron (ZOFRAN) IV, oxyCODONE, pentafluoroprop-tetrafluoroeth, senna-docusate  Exam: Alert, no distress, up in chair No jvd Chest clear bilat RRR no mrg R chest HD cath R chest pacemaker is lateral of TDC exit site in R upper chest Abd soft ntnd no mass or ascites Ext no LE edema, chron LUE edema 3+ Neuro dementia, very poor memory, behavior normal  [facility]  3h 02/16/2016   89.5kg  2/2.25 bath  P4  R IJ TDC (not using L AVF now due to swelling)  Heparin none Venofer 50/wk Mircera 225/ wk last 3/29 Calcitriol 0.75 ug      Assessment: 1. Bradycardia, recurrent - sp perm pacemaker R SCV 12/12/15 2. ESRD HD MWF 3. HTN on norvasc 4. Vol at dry wt 5. LUE edema - has TDC in due to difficulties cannulating AVF 6. Seizure d/o -  stable 7. DM stable 8. Dementia 9. Dispo - prob SNF placement  Plan - HD first shift Monday   Thursday MD [facility] pager [phone]    cell [phone] 12/14/2015, 6:30 PM    Recent Labs Lab 12/12/15 0904 12/12/15 0906 12/13/15 0543  NA 137 136 139  K 5.1 4.9 4.0  CL 100* 100* 100*  CO2 20*  --  23  GLUCOSE 217* 211* 155*  BUN 91* 90* 38*  CREATININE 11.53* 11.20* 6.82*  CALCIUM 9.6  --  8.8*    Recent Labs Lab 12/13/15 0543  AST 17  ALT 8*  ALKPHOS 48  BILITOT 0.7  PROT 6.4*  ALBUMIN 3.3*    Recent Labs Lab 12/12/15 0904 12/12/15 0906 12/13/15 0543  WBC 4.9  --  2.4*  NEUTROABS 3.5  --   --   HGB 10.7* 11.6* 10.4*  HCT 32.7* 34.0* 32.0*  MCV 97.9  --  96.1  PLT 95*  --  78*

## 2015-12-14 NOTE — Progress Notes (Signed)
Patient now reconsidering SNF placement.  Son coming to discuss.   D/C held Marlin Canary DO

## 2015-12-14 NOTE — Evaluation (Signed)
Physical Therapy Evaluation Patient Details Name: [name] MRN: [id number] DOB: 13-Jun-1932 Today's Date: 12/14/2015   History of Present Illness  [name] is a 82 y.o. male, past medical history that includes chronic kidney disease on dialysis, seizure disorder, traumatic subdural hematoma, diabetes on insulin, hypertension, anemia presents to the emergency department for AMS. Initial evaluation in the emergency department reveals Junctional rhythm, with a heart rate in low 30s, patient reports he missed his dialysis last Wednesday. Pt s/p pacemaker insertion  12/12/15.  Clinical Impression  Pt admitted with above diagnosis. Pt currently with functional limitations due to the deficits listed below (see PT Problem List).  Pt will benefit from skilled PT to increase their independence and safety with mobility to allow discharge to the venue listed below.  At this time recommend SNF, due to limited A at home from elderly wife.  Pt uses chair lift to reach second floor and limited activity tolerance at this time. If family can provide increased A and pt progresses in acute care, then could consider home with therapy.     Follow Up Recommendations SNF;Supervision/Assistance - 24 hour;Supervision for mobility/OOB    Equipment Recommendations  None recommended by PT    Recommendations for Other Services       Precautions / Restrictions Precautions Precautions: Fall Precaution Comments: pacemaker placement Required Braces or Orthoses: Sling Restrictions Other Position/Activity Restrictions: No raising arm- pacemaker precautions, MD okayed use of RW      Mobility  Bed Mobility Overal bed mobility: Needs Assistance Bed Mobility: Supine to Sit     Supine to sit: Min guard;HOB elevated;Min assist     General bed mobility comments: HOB elevated with rail.  MIN A to scoot out to EOB  Transfers Overall transfer level: Needs assistance Equipment used: Rolling walker (2  wheeled) Transfers: Sit to/from Stand Sit to Stand: Min assist;+2 physical assistance         General transfer comment: MIN A to power up and limit push with R UE. Pt reports feeling like he's been "whipped"  Ambulation/Gait Ambulation/Gait assistance: Min assist Ambulation Distance (Feet): 12 Feet Assistive device: Rolling walker (2 wheeled) Gait Pattern/deviations: Trunk flexed;Decreased step length - right;Decreased step length - left     General Gait Details: Trunk flexed and fatigued from gait around bed  Stairs            Wheelchair Mobility    Modified Rankin (Stroke Patients Only)       Balance Overall balance assessment: Needs assistance;History of Falls Sitting-balance support: Feet supported Sitting balance-Leahy Scale: Fair     Standing balance support: During functional activity Standing balance-Leahy Scale: Poor Standing balance comment: Requires RW for support                             Pertinent Vitals/Pain Pain Assessment: Faces Faces Pain Scale: Hurts even more Pain Location: back Pain Descriptors / Indicators: Sore Pain Intervention(s): Monitored during session;Repositioned    Home Living Family/patient expects to be discharged to:: Private residence Living Arrangements: Spouse/significant other Available Help at Discharge: Family Type of Home: House Home Access: Ramped entrance     Home Layout: Two level Home Equipment: Environmental consultant - 2 wheels;Bedside commode;Grab bars - toilet;Grab bars - tub/shower;Cane - single point;Shower seat;Walker - 4 wheels      Prior Function     Gait / Transfers Assistance Needed: Amb with rollator  Hand Dominance   Dominant Hand: Right    Extremity/Trunk Assessment   Upper Extremity Assessment: RUE deficits/detail RUE Deficits / Details: In sling due to pacemaker placement         Lower Extremity Assessment: Generalized weakness         Communication    Communication: No difficulties  Cognition Arousal/Alertness: Awake/alert Behavior During Therapy: WFL for tasks assessed/performed Overall Cognitive Status: No family/caregiver present to determine baseline cognitive functioning (pt oriented, but hx of dementia per chart)               Problem Solving: Slow processing;Difficulty sequencing;Requires verbal cues General Comments: Increased time required at times.    General Comments General comments (skin integrity, edema, etc.): Healing pacemake insertion site R chest, no family present    Exercises        Assessment/Plan    PT Assessment Patient needs continued PT services  PT Diagnosis Difficulty walking;Generalized weakness   PT Problem List Decreased strength;Decreased activity tolerance;Decreased balance;Decreased mobility;Decreased safety awareness  PT Treatment Interventions DME instruction;Gait training;Functional mobility training;Therapeutic activities;Therapeutic exercise;Patient/family education;Balance training   PT Goals (Current goals can be found in the Care Plan section) Acute Rehab PT Goals Patient Stated Goal: none stated PT Goal Formulation: With patient Time For Goal Achievement: 12/28/15 Potential to Achieve Goals: Good    Frequency Min 3X/week   Barriers to discharge Decreased caregiver support per DO, wife is not well    Co-evaluation               End of Session Equipment Utilized During Treatment: Gait belt Activity Tolerance: Patient limited by fatigue Patient left: in chair;with call bell/phone within reach;with chair alarm set Nurse Communication: Mobility status         Time: 4503-8882 PT Time Calculation (min) (ACUTE ONLY): 16 min   Charges:   PT Evaluation $PT Eval Moderate Complexity: 1 Procedure     PT G Codes:        [name] 12/14/2015, 10:54 AM

## 2015-12-15 ENCOUNTER — Encounter (HOSPITAL_COMMUNITY): Payer: Self-pay | Admitting: Cardiology

## 2015-12-15 DIAGNOSIS — Z8719 Personal history of other diseases of the digestive system: Secondary | ICD-10-CM | POA: Diagnosis not present

## 2015-12-15 DIAGNOSIS — G4730 Sleep apnea, unspecified: Secondary | ICD-10-CM | POA: Diagnosis not present

## 2015-12-15 DIAGNOSIS — M7989 Other specified soft tissue disorders: Secondary | ICD-10-CM | POA: Diagnosis not present

## 2015-12-15 DIAGNOSIS — R2681 Unsteadiness on feet: Secondary | ICD-10-CM | POA: Diagnosis not present

## 2015-12-15 DIAGNOSIS — T82898A Other specified complication of vascular prosthetic devices, implants and grafts, initial encounter: Secondary | ICD-10-CM | POA: Diagnosis present

## 2015-12-15 DIAGNOSIS — D631 Anemia in chronic kidney disease: Secondary | ICD-10-CM | POA: Diagnosis not present

## 2015-12-15 DIAGNOSIS — E119 Type 2 diabetes mellitus without complications: Secondary | ICD-10-CM | POA: Diagnosis not present

## 2015-12-15 DIAGNOSIS — R222 Localized swelling, mass and lump, trunk: Secondary | ICD-10-CM | POA: Diagnosis not present

## 2015-12-15 DIAGNOSIS — M1990 Unspecified osteoarthritis, unspecified site: Secondary | ICD-10-CM | POA: Diagnosis not present

## 2015-12-15 DIAGNOSIS — R5381 Other malaise: Secondary | ICD-10-CM | POA: Diagnosis not present

## 2015-12-15 DIAGNOSIS — Z79899 Other long term (current) drug therapy: Secondary | ICD-10-CM | POA: Diagnosis not present

## 2015-12-15 DIAGNOSIS — N189 Chronic kidney disease, unspecified: Secondary | ICD-10-CM | POA: Diagnosis not present

## 2015-12-15 DIAGNOSIS — K219 Gastro-esophageal reflux disease without esophagitis: Secondary | ICD-10-CM | POA: Diagnosis not present

## 2015-12-15 DIAGNOSIS — Y658 Other specified misadventures during surgical and medical care: Secondary | ICD-10-CM | POA: Diagnosis not present

## 2015-12-15 DIAGNOSIS — R001 Bradycardia, unspecified: Secondary | ICD-10-CM | POA: Diagnosis not present

## 2015-12-15 DIAGNOSIS — G9340 Encephalopathy, unspecified: Secondary | ICD-10-CM | POA: Diagnosis not present

## 2015-12-15 DIAGNOSIS — M79609 Pain in unspecified limb: Secondary | ICD-10-CM | POA: Diagnosis not present

## 2015-12-15 DIAGNOSIS — Z794 Long term (current) use of insulin: Secondary | ICD-10-CM | POA: Diagnosis not present

## 2015-12-15 DIAGNOSIS — D649 Anemia, unspecified: Secondary | ICD-10-CM | POA: Diagnosis not present

## 2015-12-15 DIAGNOSIS — R4182 Altered mental status, unspecified: Secondary | ICD-10-CM | POA: Diagnosis not present

## 2015-12-15 DIAGNOSIS — Z992 Dependence on renal dialysis: Secondary | ICD-10-CM | POA: Diagnosis not present

## 2015-12-15 DIAGNOSIS — M79602 Pain in left arm: Secondary | ICD-10-CM | POA: Diagnosis not present

## 2015-12-15 DIAGNOSIS — M069 Rheumatoid arthritis, unspecified: Secondary | ICD-10-CM | POA: Diagnosis not present

## 2015-12-15 DIAGNOSIS — I10 Essential (primary) hypertension: Secondary | ICD-10-CM | POA: Diagnosis not present

## 2015-12-15 DIAGNOSIS — I5040 Unspecified combined systolic (congestive) and diastolic (congestive) heart failure: Secondary | ICD-10-CM | POA: Diagnosis not present

## 2015-12-15 DIAGNOSIS — M6281 Muscle weakness (generalized): Secondary | ICD-10-CM | POA: Diagnosis not present

## 2015-12-15 DIAGNOSIS — E1121 Type 2 diabetes mellitus with diabetic nephropathy: Secondary | ICD-10-CM | POA: Diagnosis not present

## 2015-12-15 DIAGNOSIS — R609 Edema, unspecified: Secondary | ICD-10-CM | POA: Diagnosis not present

## 2015-12-15 DIAGNOSIS — D509 Iron deficiency anemia, unspecified: Secondary | ICD-10-CM | POA: Diagnosis not present

## 2015-12-15 DIAGNOSIS — N186 End stage renal disease: Secondary | ICD-10-CM | POA: Diagnosis not present

## 2015-12-15 DIAGNOSIS — G3184 Mild cognitive impairment, so stated: Secondary | ICD-10-CM | POA: Diagnosis not present

## 2015-12-15 DIAGNOSIS — I120 Hypertensive chronic kidney disease with stage 5 chronic kidney disease or end stage renal disease: Secondary | ICD-10-CM | POA: Diagnosis not present

## 2015-12-15 LAB — RENAL FUNCTION PANEL
ALBUMIN: 3.1 g/dL — AB (ref 3.5–5.0)
ANION GAP: 11 (ref 5–15)
BUN: 58 mg/dL — AB (ref 6–20)
CALCIUM: 9 mg/dL (ref 8.9–10.3)
CO2: 24 mmol/L (ref 22–32)
Chloride: 99 mmol/L — ABNORMAL LOW (ref 101–111)
Creatinine, Ser: 9.22 mg/dL — ABNORMAL HIGH (ref 0.61–1.24)
GFR calc Af Amer: 5 mL/min — ABNORMAL LOW (ref >60)
GFR, EST NON AFRICAN AMERICAN: 5 mL/min — AB (ref >60)
GLUCOSE: 112 mg/dL — AB (ref 65–99)
PHOSPHORUS: 3.7 mg/dL (ref 2.5–4.6)
POTASSIUM: 4.6 mmol/L (ref 3.5–5.1)
SODIUM: 134 mmol/L — AB (ref 135–145)

## 2015-12-15 LAB — CBC
HEMATOCRIT: 28.2 % — AB (ref 39.0–52.0)
HEMOGLOBIN: 9.6 g/dL — AB (ref 13.0–17.0)
MCH: 32.2 pg (ref 26.0–34.0)
MCHC: 34 g/dL (ref 30.0–36.0)
MCV: 94.6 fL (ref 78.0–100.0)
Platelets: 78 10*3/uL — ABNORMAL LOW (ref 150–400)
RBC: 2.98 MIL/uL — ABNORMAL LOW (ref 4.22–5.81)
RDW: 15.1 % (ref 11.5–15.5)
WBC: 2.5 10*3/uL — AB (ref 4.0–10.5)

## 2015-12-15 MED ORDER — CALCITRIOL 0.5 MCG PO CAPS
ORAL_CAPSULE | ORAL | Status: AC
  Filled 2015-12-15: qty 2

## 2015-12-15 MED ORDER — LIDOCAINE-PRILOCAINE 2.5-2.5 % EX CREA
1.0000 "application " | TOPICAL_CREAM | CUTANEOUS | Status: DC | PRN
  Filled 2015-12-15: qty 5

## 2015-12-15 MED ORDER — SODIUM CHLORIDE 0.9 % IV SOLN: 100.0000 mL | INTRAVENOUS | Status: DC | PRN

## 2015-12-15 MED ORDER — AMLODIPINE BESYLATE 10 MG PO TABS: 10.0000 mg | ORAL_TABLET | Freq: Every day | ORAL | Status: DC

## 2015-12-15 MED ORDER — AMLODIPINE BESYLATE 5 MG PO TABS
5.0000 mg | ORAL_TABLET | Freq: Every day | ORAL | Status: DC
Start: 2015-12-15 — End: 2015-12-15

## 2015-12-15 MED ORDER — HEPARIN SODIUM (PORCINE) 1000 UNIT/ML DIALYSIS
1000.0000 [IU] | INTRAMUSCULAR | Status: DC | PRN
  Filled 2015-12-15: qty 1

## 2015-12-15 MED ORDER — LIDOCAINE HCL (PF) 1 % IJ SOLN: 5.0000 mL | INTRAMUSCULAR | Status: DC | PRN

## 2015-12-15 MED ORDER — ALTEPLASE 2 MG IJ SOLR: 2.0000 mg | Freq: Once | INTRAMUSCULAR | Status: DC | PRN

## 2015-12-15 MED ORDER — AMLODIPINE BESYLATE 5 MG PO TABS: 5.0000 mg | ORAL_TABLET | Freq: Every day | ORAL | Status: DC

## 2015-12-15 MED ORDER — PENTAFLUOROPROP-TETRAFLUOROETH EX AERO: 1.0000 "application " | INHALATION_SPRAY | CUTANEOUS | Status: DC | PRN

## 2015-12-15 NOTE — Progress Notes (Signed)
CSW consulted for pt SNF placement.  CSW spoke with pt son who is agreeable to placement and prefers [facility] due to previous stay at that facility- they are able to offer bed today.   Patient will discharge to [facility] Anticipated discharge date: 4/10 Family notified:son, [name], and wife at bedside Transportation by [employer]- scheduled for 1:30pm  CSW signing off.  Merlyn Lot, LCSWA Clinical Social Worker [phone]

## 2015-12-15 NOTE — Procedures (Signed)
I was present at this session.  I have reviewed the session itself and made appropriate changes.  HD via LUA AVF.  Using 17 ga. Arm swollen , if goes well ,get PC out .  Risk with PACER.  Markiah Janeway L 4/10/20178:00 AM

## 2015-12-15 NOTE — Progress Notes (Signed)
Report called to [employer] at [phone].  Pt transported via ambulance to SNF.  Pt in stable condition.  No acute distress.  Wife and son at bedside. All belongings sent with family.

## 2015-12-15 NOTE — Progress Notes (Signed)
Subjective: Interval History: has no complaint .  Objective: Vital signs in last 24 hours: Temp:  [98 F (36.7 C)-98.6 F (37 C)] 98 F (36.7 C) (04/10 0730) Pulse Rate:  [68-73] 70 (04/10 0735) Resp:  [11-18] 12 (04/10 0735) BP: (101-161)/(63-73) 135/66 mmHg (04/10 0735) SpO2:  [98 %-99 %] 98 % (04/10 0730) Weight:  [91 kg (200 lb 9.9 oz)-91.309 kg (201 lb 4.8 oz)] 91 kg (200 lb 9.9 oz) (04/10 0730) Weight change: 0.309 kg (10.9 oz)  Intake/Output from previous day: 04/09 0701 - 04/10 0700 In: 720 [P.O.:720] Out: 250 [Urine:250] Intake/Output this shift:    General appearance: alert, cooperative and mildly obese Resp: clear to auscultation bilaterally Cardio: S1, S2 normal and systolic murmur: holosystolic 2/6, blowing at apex GI: obese, pos bs, soft,  Extremities: L arm swollen ,  RIJ cath, Pacer on R  Lab Results:  Recent Labs  12/12/15 0904 12/12/15 0906 12/13/15 0543  WBC 4.9  --  2.4*  HGB 10.7* 11.6* 10.4*  HCT 32.7* 34.0* 32.0*  PLT 95*  --  78*   BMET:  Recent Labs  12/12/15 0904 12/12/15 0906 12/13/15 0543  NA 137 136 139  K 5.1 4.9 4.0  CL 100* 100* 100*  CO2 20*  --  23  GLUCOSE 217* 211* 155*  BUN 91* 90* 38*  CREATININE 11.53* 11.20* 6.82*  CALCIUM 9.6  --  8.8*   No results for input(s): PTH in the last 72 hours. Iron Studies: No results for input(s): IRON, TIBC, TRANSFERRIN, FERRITIN in the last 72 hours.  Studies/Results: No results found.  I have reviewed the patient's current medications.  Assessment/Plan: 1 ESRD for HD , use AVF, get cath out if can use.  Severe risk infx 2 Anemia esa 3 Dementia 4 Access some occulusion on L will need new access.  5 HPTH vit D P HD, get cath out if goes well.     LOS: 3 days   Jayne Peckenpaugh L 12/15/2015,8:01 AM

## 2015-12-15 NOTE — Care Management Important Message (Signed)
Important Message  Patient Details  Name: [name] MRN: [id number] Date of Birth: 09-Nov-1931   Medicare Important Message Given:  Yes    Elliot Cousin, RN 12/15/2015, 12:15 PM

## 2015-12-15 NOTE — Care Management Note (Addendum)
Case Management Note  Patient Details  Name: [name] MRN: [id number] Date of Birth: [birth year]           Action/Plan: Discharge Planning: AVS reviewed: Please see previous NCM notes. Scheduled dc to SNF, CSW working on SNF placement. Notified [name] of plan dc to SNF. [name], [name] at home to assist with care. Pt can afford his medications.  Benay Pillow MD    Expected Discharge Date:  12/15/2015               Expected Discharge Plan:  Skilled Nursing Facility  In-House Referral:  NA  Discharge planning Services  CM Consult  Post Acute Care Choice:  NA Choice offered to: NA  DME Arranged:  Lightweight manual wheelchair with seat cushion DME Agency:  [facility]  Status of Service:  Completed, signed off  Medicare Important Message Given:  Yes Date Medicare IM Given:    Medicare IM give by:    Date Additional Medicare IM Given:    Additional Medicare Important Message give by:     If discussed at Long Length of Stay Meetings, dates discussed:    Additional Comments:  Elliot Cousin, RN 12/15/2015, 12:19 PM

## 2015-12-15 NOTE — Clinical Social Work Placement (Signed)
   CLINICAL SOCIAL WORK PLACEMENT  NOTE  Date:  12/15/2015  Patient Details  Name: [name] MRN: [id number] Date of Birth: 04-29-1932  Clinical Social Work is seeking post-discharge placement for this patient at the Skilled  Nursing Facility level of care (*CSW will initial, date and re-position this form in  chart as items are completed):  Yes   Patient/family provided with [facility] Clinical Social Work Department's list of facilities offering this level of care within the geographic area requested by the patient (or if unable, by the patient's family).  Yes   Patient/family informed of their freedom to choose among providers that offer the needed level of care, that participate in Medicare, Medicaid or managed care program needed by the patient, have an available bed and are willing to accept the patient.  Yes   Patient/family informed of [facility]'s ownership interest in [facility] and [facility], as well as of the fact that they are under no obligation to receive care at these facilities.  PASRR submitted to EDS on       PASRR number received on       Existing PASRR number confirmed on 12/15/15     FL2 transmitted to all facilities in geographic area requested by pt/family on 12/15/15     FL2 transmitted to all facilities within larger geographic area on       Patient informed that his/her managed care company has contracts with or will negotiate with certain facilities, including the following:        Yes   Patient/family informed of bed offers received.  Patient chooses bed at [facility]     Physician recommends and patient chooses bed at      Patient to be transferred to [facility] on 12/15/15.  Patient to be transferred to facility by ptar     Patient family notified on 12/15/15 of transfer.  Name of family member notified:  [name]     PHYSICIAN Please sign FL2, Please sign DNR     Additional Comment:     _______________________________________________ [name], LCSW 12/15/2015, 12:49 PM

## 2015-12-15 NOTE — Progress Notes (Signed)
Report received via Maureen Ralphs RN, reviewed orders, labs, VS, meds and patient's general condition, assumed care of patient.

## 2015-12-15 NOTE — NC FL2 (Signed)
[place] MEDICAID FL2 LEVEL OF CARE SCREENING TOOL     IDENTIFICATION  Patient Name: [name] Birthdate: [birth year] Sex: male Admission Date (Current Location): 12/12/2015  [facility] and [place] Number:  Producer, television/film/video and Address:  [facility] [place], [address] [place], [place] [address]      Provider Number: [id number]  Attending Physician Name and Address:  Joseph Art, DO  Relative Name and Phone Number:       Current Level of Care: Hospital Recommended Level of Care: Skilled Nursing Facility Prior Approval Number:    Date Approved/Denied:   PASRR Number: [id number]  Discharge Plan: SNF    Current Diagnoses: Patient Active Problem List   Diagnosis Date Noted  . Altered mental state 12/12/2015  . Left arm swelling 12/05/2015  . Midline low back pain without sciatica 12/05/2015  . Controlled type 2 diabetes mellitus with diabetic nephropathy, without long-term current use of insulin (HCC) 12/05/2015  . AV junctional bradycardia 12/05/2015  . Palliative care encounter   . Goals of care, counseling/discussion   . Chronic diastolic congestive heart failure (HCC) 11/20/2015  . Dementia 10/23/2015  . Metabolic encephalopathy 10/23/2015  . Uremia 09/15/2015  . Subdural hematoma (HCC) 07/26/2015  . Acute encephalopathy 06/21/2015  . Pancytopenia (HCC) 06/20/2015  . Word finding difficulty 05/29/2015  . Polyneuropathy (HCC) 05/29/2015  . Laceration of scalp 05/19/2015  . Degenerative disc disease, lumbar 08/15/2014  . Seizure disorder (HCC) 10/17/2013  . Urinary incontinence 10/01/2013  . ESRD on dialysis (HCC) 09/27/2013  . Secondary hyperparathyroidism, renal (HCC) 09/27/2013  . Gout flare 01/17/2013  . Anemia in chronic kidney disease 11/30/2012  . Hypoglycemia associated with diabetes (HCC) 11/30/2012  . history of AVM (arteriovenous malformation) of colon with hemorrhage 11/29/2012  . Acute GI bleeding 10/02/2012  .  Hypertension 10/02/2012    Orientation RESPIRATION BLADDER Height & Weight     Self, Place  Normal Continent Weight: 200 lb 9.9 oz (91 kg) Height:  6' (182.9 cm)  BEHAVIORAL SYMPTOMS/MOOD NEUROLOGICAL BOWEL NUTRITION STATUS      Continent Diet (renal, carb modified)  AMBULATORY STATUS COMMUNICATION OF NEEDS Skin   Limited Assist Verbally Normal                       Personal Care Assistance Level of Assistance  Bathing, Dressing Bathing Assistance: Limited assistance   Dressing Assistance: Limited assistance     Functional Limitations Info             SPECIAL CARE FACTORS FREQUENCY  PT (By licensed PT), OT (By licensed OT)     PT Frequency: 5/wk OT Frequency: 5/wk            Contractures      Additional Factors Info  Code Status, Allergies, Insulin Sliding Scale Code Status Info: DNR Allergies Info: Aspirin, Lactose Intolerance (Gi)   Insulin Sliding Scale Info: 3/day       Current Medications (12/15/2015):  This is the current hospital active medication list Current Facility-Administered Medications  Medication Dose Route Frequency Provider Last Rate Last Dose  . 0.9 %  sodium chloride infusion  100 mL Intravenous PRN Weston Settle, PA-C      . 0.9 %  sodium chloride infusion  100 mL Intravenous PRN Weston Settle, PA-C      . 0.9 %  sodium chloride infusion  100 mL Intravenous PRN Delano Metz, MD      . 0.9 %  sodium chloride infusion  100 mL Intravenous PRN Delano Metz, MD      . acetaminophen (TYLENOL) tablet 325-650 mg  325-650 mg Oral Q4H PRN Will Jorja Loa, MD   650 mg at 12/13/15 1136  . alteplase (CATHFLO ACTIVASE) injection 2 mg  2 mg Intracatheter Once PRN Delano Metz, MD      . amLODipine (NORVASC) tablet 5 mg  5 mg Oral QHS Beryle Lathe, MD      . calcitRIOL (ROCALTROL) capsule 1 mcg  1 mcg Oral Q M,W,F-HD Leana Roe Elgergawy, MD      . calcium acetate (PHOSLO) capsule 2,001 mg  2,001 mg Oral TID WC Starleen Arms, MD    2,001 mg at 12/14/15 1735  . cinacalcet (SENSIPAR) tablet 30 mg  30 mg Oral Q supper Starleen Arms, MD   30 mg at 12/14/15 1735  . [START ON 12/17/2015] Darbepoetin Alfa (ARANESP) injection 150 mcg  150 mcg Intravenous Q Wed-HD Weston Settle, PA-C      . divalproex (DEPAKOTE ER) 24 hr tablet 250 mg  250 mg Oral Q12H Starleen Arms, MD   250 mg at 12/14/15 2135  . ferric gluconate (NULECIT) 62.5 mg in sodium chloride 0.9 % 100 mL IVPB  62.5 mg Intravenous Q Fri-HD Weston Settle, PA-C   62.5 mg at 12/12/15 2049  . heparin injection 1,000 Units  1,000 Units Dialysis PRN Delano Metz, MD      . insulin aspart (novoLOG) injection 0-9 Units  0-9 Units Subcutaneous TID WC Starleen Arms, MD   5 Units at 12/14/15 1734  . lacosamide (VIMPAT) tablet 100 mg  100 mg Oral BID Starleen Arms, MD   100 mg at 12/14/15 2135  . lidocaine (PF) (XYLOCAINE) 1 % injection 5 mL  5 mL Intradermal PRN Delano Metz, MD      . lidocaine-prilocaine (EMLA) cream 1 application  1 application Topical PRN Delano Metz, MD      . multivitamin (RENA-VIT) tablet 1 tablet  1 tablet Oral QHS Starleen Arms, MD   1 tablet at 12/14/15 2136  . ondansetron (ZOFRAN) injection 4 mg  4 mg Intravenous Q6H PRN Will Jorja Loa, MD      . oxyCODONE (Oxy IR/ROXICODONE) immediate release tablet 5 mg  5 mg Oral Q6H PRN Joseph Art, DO   5 mg at 12/13/15 2202  . pantoprazole (PROTONIX) EC tablet 40 mg  40 mg Oral BID Starleen Arms, MD   40 mg at 12/14/15 2135  . pentafluoroprop-tetrafluoroeth (GEBAUERS) aerosol 1 application  1 application Topical PRN Delano Metz, MD      . senna-docusate (Senokot-S) tablet 1 tablet  1 tablet Oral QHS PRN Starleen Arms, MD   1 tablet at 12/14/15 2135  . sodium chloride flush (NS) 0.9 % injection 3 mL  3 mL Intravenous Q12H Starleen Arms, MD   3 mL at 12/14/15 2200   Facility-Administered Medications Ordered in Other Encounters  Medication Dose Route Frequency  Provider Last Rate Last Dose  . Chlorhexidine Gluconate Cloth 2 % PADS 6 each  6 each Topical Once Sherren Kerns, MD         Discharge Medications: Please see discharge summary for a list of discharge medications.  Relevant Imaging Results:  Relevant Lab Results:   Additional Information SSN [id number]  ESRD - Dialysis MWF at [facility], [place], [place]

## 2015-12-15 NOTE — Discharge Summary (Signed)
Physician Discharge Summary  Suleman Gunning ZOX:[id number] DOB: [birth year] DOA: 12/12/2015  PCP: [name], DO  Admit date: 12/12/2015 Discharge date: 12/15/2015  Time spent: 35 minutes  Recommendations for Outpatient Follow-up:  HD M/W/F  Discharge Diagnoses:  Active Problems:   Hypertension   Anemia in chronic kidney disease   ESRD on dialysis [facility])   Seizure disorder (HCC)   Chronic diastolic congestive heart failure (HCC)   Controlled type 2 diabetes mellitus with diabetic nephropathy, without long-term current use of insulin (HCC)   AV junctional bradycardia   Altered mental state   Discharge Condition: improved  Diet recommendation: renal/carb mod  Filed Weights   12/14/15 0423 12/15/15 0548 12/15/15 0730  Weight: 91 kg (200 lb 9.9 oz) 91.309 kg (201 lb 4.8 oz) 91 kg (200 lb 9.9 oz)    History of present illness:  [name] is a 82 y.o. male, past medical history that includes chronic kidney disease on dialysis, seizure disorder, traumatic subdural hematoma, diabetes on insulin, hypertension, anemia presents to the emergency department for AMS. Initial evaluation in the emergency department reveals Junctional rhythm, with a heart rate in low 30s, patient reports he missed his dialysis last Wednesday, patient with recent hospitalization significant for bradycardia with junctional rhythm, thought to be secondary to hyperkalemia, bradycardia has resolved with dialysis, patient was thought not to be a pacemaker candidate. AP in the past ,patient potassium is 5.1 today, denies any focal deficits, but confused as per family over the last 2 days, as well with generalized weakness, afebrile, no leukocytosis, chest x-ray with no acute finding, denies chest pain, dyspnea, nausea or vomiting.   Hospital Course:  Altered mental status - Patient had similar presentation during last admission, encephalopathy resolved with resolution of bradycardia, so most likely bradycardia is the  cause of his altered mental status, as well patient is with significantly elevated BUN, may be mild uremia contributing as well. -resolved on AM of 4/8 -does have baseline dementia  Junctional bradycardia - s/p pacemaker  End-stage renal DISEASE - s/p hemodialysis  Chronic diastolic heart failure - Volume management with hemodialysis  Diabetes mellitus -SSI  Hypertension - Blood pressure uncontrolled, will continue with Norvasc  Seizure disorder - Continue with home medication  Procedures:    Consultations:  Renal  EP  Discharge Exam: Filed Vitals:   12/15/15 0930 12/15/15 1000  BP: 131/53 93/52  Pulse: 76 80  Temp:    Resp: 14 12    General: in dialysis Cardiovascular: rrr Respiratory: clear  Discharge Instructions   Discharge Instructions    Discharge instructions    Complete by:  As directed   To SNF Renal/carb mod diet HD M/W/F     Increase activity slowly    Complete by:  As directed           Current Discharge Medication List    CONTINUE these medications which have CHANGED   Details  amLODipine (NORVASC) 5 MG tablet Take 1 tablet (5 mg total) by mouth at bedtime.    gabapentin (NEURONTIN) 100 MG capsule Take 1 capsule (100 mg total) by mouth 3 (three) times daily as needed (pain). Qty: 30 capsule, Refills: 0   Associated Diagnoses: Degenerative disc disease, lumbar      CONTINUE these medications which have NOT CHANGED   Details  buprenorphine (BUTRANS) 5 MCG/HR PTWK patch Place 1 patch (5 mcg total) onto the skin once a week. Qty: 4 patch, Refills: 3   Associated Diagnoses: Midline low back pain without  sciatica    calcium acetate (PHOSLO) 667 MG capsule Take 3 capsules by mouth three times a day Qty: 180 capsule, Refills: 3    cinacalcet (SENSIPAR) 30 MG tablet Take 1 tablet (30 mg total) by mouth daily with supper.    colchicine 0.6 MG tablet Take 1 tablet (0.6 mg total) by mouth as needed. For gout. Patient needs an  appointment before anymore refills. Qty: 30 tablet, Refills: 0    divalproex (DEPAKOTE ER) 250 MG 24 hr tablet Take 250 mg by mouth every 12 (twelve) hours.    fexofenadine (ALLEGRA) 180 MG tablet Take 180 mg by mouth daily as needed for allergies or rhinitis.    folic acid (FOLVITE) 1 MG tablet Take 1 tablet (1 mg total) by mouth daily. Qty: 30 tablet, Refills: 0    HUMALOG KWIKPEN 100 UNIT/ML KiwkPen Inject 0-0.1 mLs (0-10 Units total) into the skin as needed (prn based on blood sugar reading). Sliding Scale Qty: 15 mL, Refills: 5   Associated Diagnoses: Controlled type 2 diabetes mellitus with diabetic nephropathy, without long-term current use of insulin (HCC)    insulin aspart protamine- aspart (NOVOLOG MIX 70/30) (70-30) 100 UNIT/ML injection Inject 0-10 Units into the skin 3 (three) times daily.    lacosamide (VIMPAT) 50 MG TABS tablet 150mg  BID on M-W-F (dialysis days); 100mg  BID on Tue-Thu-Sat-Sun (non-dialysis days) Qty: 180 tablet, Refills: 5    multivitamin (RENA-VIT) TABS tablet Take 1 tablet by mouth at bedtime. Qty: 30 tablet, Refills: 0    pantoprazole (PROTONIX) 40 MG tablet Take 1 tablet (40 mg total) by mouth 2 (two) times daily. Qty: 60 tablet, Refills: 0    sennosides-docusate sodium (SENOKOT-S) 8.6-50 MG tablet Take 1 tablet by mouth at bedtime as needed for constipation.    calcitRIOL (ROCALTROL) 0.5 MCG capsule Take 2 capsules (1 mcg total) by mouth every Monday, Wednesday, and Friday with hemodialysis.      STOP taking these medications     darbepoetin (ARANESP) 60 MCG/0.3ML SOLN injection        Allergies  Allergen Reactions  . Aspirin Other (See Comments)    Bleeding   . Lactose Intolerance (Gi) Diarrhea   Follow-up Information    Follow up with [name], [name], DO In 1 week.   Specialty:  Geriatric Medicine   Contact information:   [address] [place] [phone]       Please follow up.   Why:  continue dialysis as  instructed by your nephrologist      Follow up with Will [id number], MD.   Specialty:  Cardiology   Contact information:   [address] [place] [address] [place] [phone]       Follow up with [facility].   Why:  home health physical and occupational therapy and nurse, and aide   Contact information:   [address] [place] [address] [place] [phone]        The results of significant diagnostics from this hospitalization (including imaging, microbiology, ancillary and laboratory) are listed below for reference.    Significant Diagnostic Studies: Dg Chest 2 View  12/13/2015  CLINICAL DATA:  82 year old who underwent pacemaker placement yesterday. EXAM: CHEST  2 VIEW COMPARISON:  12/12/2015 and earlier. FINDINGS: Right subclavian dual lead transvenous pacemaker with the lead tips at the expected location of the right atrial appendage and the proximal right ventricle. No pneumothorax. Right jugular dialysis catheter tips project over the upper right atrium  and lower SVC, unchanged. Cardiac silhouette mildly enlarged scratch they cardiac silhouette moderately enlarged, unchanged. Lungs clear. Bronchovascular markings normal. Pulmonary vascularity normal. No visible pleural effusions. Visualized bony thorax intact. IMPRESSION: 1. Right subclavian dual lead transvenous pacemaker without acute complicating features. 2. Stable cardiomegaly.  No acute cardiopulmonary disease. Electronically Signed   By: Hulan Saas M.D.   On: 12/13/2015 09:44   Dg Chest Portable 1 View  12/12/2015  CLINICAL DATA:  82 year old male with 2 days of shortness of breath and weakness. Missed dialysis. Initial encounter. EXAM: PORTABLE CHEST 1 VIEW COMPARISON:  11/27/2015 and earlier. FINDINGS: Portable AP semi upright view at 0931 hours. Stable tunneled type right chest dialysis catheter. Mildly lower lung volumes. Slightly increased pulmonary vascularity but no acute edema. No  pneumothorax, pleural effusion or confluent pulmonary opacity. Stable cardiac size and mediastinal contours. Visualized tracheal air column is within normal limits. IMPRESSION: No acute cardiopulmonary abnormality; slightly increased pulmonary vascularity without acute edema Electronically Signed   By: Odessa Fleming M.D.   On: 12/12/2015 09:41   Dg Chest Port 1 View  11/27/2015  CLINICAL DATA:  Status post dialysis catheter placement EXAM: PORTABLE CHEST 1 VIEW COMPARISON:  11/26/2015 FINDINGS: Cardiac shadow is stable. The lungs are clear. No infiltrate or pneumothorax is seen. A dialysis catheter is noted in satisfactory position. IMPRESSION: No acute abnormality.  No pneumothorax following catheter placement. Electronically Signed   By: Alcide Clever M.D.   On: 11/27/2015 15:59   Dg Chest Portable 1 View  11/26/2015  CLINICAL DATA:  Weakness EXAM: PORTABLE CHEST 1 VIEW COMPARISON:  10/23/2015 FINDINGS: Cardiomediastinal silhouette is stable. No acute infiltrate or pleural effusion. No pulmonary edema. Bony thorax is unremarkable. IMPRESSION: No active disease. Electronically Signed   By: Natasha Mead M.D.   On: 11/26/2015 14:09   Dg Fluoro Guide Cv Line-no Report  11/27/2015  CLINICAL DATA:  FLOURO GUIDE CV LINE Fluoroscopy was utilized by the requesting physician.  No radiographic interpretation.    Microbiology: Recent Results (from the past 240 hour(s))  MRSA PCR Screening     Status: None   Collection Time: 12/12/15 11:00 PM  Result Value Ref Range Status   MRSA by PCR NEGATIVE NEGATIVE Final    Comment:        The GeneXpert MRSA Assay (FDA approved for NASAL specimens only), is one component of a comprehensive MRSA colonization surveillance program. It is not intended to diagnose MRSA infection nor to guide or monitor treatment for MRSA infections.      Labs: Basic Metabolic Panel:  Recent Labs Lab 12/12/15 0904 12/12/15 0906 12/13/15 0543 12/15/15 0759  NA 137 136 139 134*  K  5.1 4.9 4.0 4.6  CL 100* 100* 100* 99*  CO2 20*  --  23 24  GLUCOSE 217* 211* 155* 112*  BUN 91* 90* 38* 58*  CREATININE 11.53* 11.20* 6.82* 9.22*  CALCIUM 9.6  --  8.8* 9.0  PHOS  --   --   --  3.7   Liver Function Tests:  Recent Labs Lab 12/13/15 0543 12/15/15 0759  AST 17  --   ALT 8*  --   ALKPHOS 48  --   BILITOT 0.7  --   PROT 6.4*  --   ALBUMIN 3.3* 3.1*   No results for input(s): LIPASE, AMYLASE in the last 168 hours. No results for input(s): AMMONIA in the last 168 hours. CBC:  Recent Labs Lab 12/12/15 0904 12/12/15 1610 12/13/15 0543 12/15/15 0759  WBC 4.9  --  2.4* 2.5*  NEUTROABS 3.5  --   --   --   HGB 10.7* 11.6* 10.4* 9.6*  HCT 32.7* 34.0* 32.0* 28.2*  MCV 97.9  --  96.1 94.6  PLT 95*  --  78* 78*   Cardiac Enzymes: No results for input(s): CKTOTAL, CKMB, CKMBINDEX, TROPONINI in the last 168 hours. BNP: BNP (last 3 results) No results for input(s): BNP in the last 8760 hours.  ProBNP (last 3 results) No results for input(s): PROBNP in the last 8760 hours.  CBG:  Recent Labs Lab 12/13/15 2118 12/14/15 0743 12/14/15 1156 12/14/15 1654 12/14/15 2129  GLUCAP 205* 139* 172* 252* 114*       Signed:  Alexei Doswell U Nuria Phebus DO.  [facility] 12/15/2015, 10:24 AM

## 2015-12-17 DIAGNOSIS — N186 End stage renal disease: Secondary | ICD-10-CM | POA: Diagnosis not present

## 2015-12-17 DIAGNOSIS — E119 Type 2 diabetes mellitus without complications: Secondary | ICD-10-CM | POA: Diagnosis not present

## 2015-12-17 DIAGNOSIS — D509 Iron deficiency anemia, unspecified: Secondary | ICD-10-CM | POA: Diagnosis not present

## 2015-12-17 DIAGNOSIS — D631 Anemia in chronic kidney disease: Secondary | ICD-10-CM | POA: Diagnosis not present

## 2015-12-19 DIAGNOSIS — D509 Iron deficiency anemia, unspecified: Secondary | ICD-10-CM | POA: Diagnosis not present

## 2015-12-19 DIAGNOSIS — D631 Anemia in chronic kidney disease: Secondary | ICD-10-CM | POA: Diagnosis not present

## 2015-12-19 DIAGNOSIS — N186 End stage renal disease: Secondary | ICD-10-CM | POA: Diagnosis not present

## 2015-12-22 ENCOUNTER — Ambulatory Visit: Payer: Medicare Other

## 2015-12-22 ENCOUNTER — Emergency Department (HOSPITAL_COMMUNITY)
Admission: EM | Admit: 2015-12-22 | Discharge: 2015-12-22 | Disposition: A | Discharge status: Home / Self Care | Payer: Medicare Other | Attending: Emergency Medicine | Admitting: Emergency Medicine

## 2015-12-22 ENCOUNTER — Emergency Department (HOSPITAL_BASED_OUTPATIENT_CLINIC_OR_DEPARTMENT_OTHER)
Admit: 2015-12-22 | Discharge: 2015-12-22 | Disposition: A | Discharge status: Home / Self Care | Payer: Medicare Other | Attending: Emergency Medicine | Admitting: Emergency Medicine

## 2015-12-22 ENCOUNTER — Other Ambulatory Visit: Payer: Self-pay

## 2015-12-22 ENCOUNTER — Emergency Department (HOSPITAL_BASED_OUTPATIENT_CLINIC_OR_DEPARTMENT_OTHER): Admit: 2015-12-22 | Discharge: 2015-12-22 | Disposition: A | Discharge status: Home / Self Care | Payer: Medicare Other

## 2015-12-22 ENCOUNTER — Encounter (HOSPITAL_COMMUNITY): Payer: Self-pay | Admitting: Family Medicine

## 2015-12-22 DIAGNOSIS — Z8719 Personal history of other diseases of the digestive system: Secondary | ICD-10-CM | POA: Diagnosis not present

## 2015-12-22 DIAGNOSIS — D631 Anemia in chronic kidney disease: Secondary | ICD-10-CM | POA: Diagnosis not present

## 2015-12-22 DIAGNOSIS — E119 Type 2 diabetes mellitus without complications: Secondary | ICD-10-CM | POA: Insufficient documentation

## 2015-12-22 DIAGNOSIS — I120 Hypertensive chronic kidney disease with stage 5 chronic kidney disease or end stage renal disease: Secondary | ICD-10-CM | POA: Diagnosis not present

## 2015-12-22 DIAGNOSIS — M79609 Pain in unspecified limb: Secondary | ICD-10-CM

## 2015-12-22 DIAGNOSIS — N186 End stage renal disease: Secondary | ICD-10-CM | POA: Insufficient documentation

## 2015-12-22 DIAGNOSIS — Z794 Long term (current) use of insulin: Secondary | ICD-10-CM | POA: Insufficient documentation

## 2015-12-22 DIAGNOSIS — M7989 Other specified soft tissue disorders: Secondary | ICD-10-CM

## 2015-12-22 DIAGNOSIS — G4730 Sleep apnea, unspecified: Secondary | ICD-10-CM | POA: Insufficient documentation

## 2015-12-22 DIAGNOSIS — Z79899 Other long term (current) drug therapy: Secondary | ICD-10-CM | POA: Diagnosis not present

## 2015-12-22 DIAGNOSIS — Y658 Other specified misadventures during surgical and medical care: Secondary | ICD-10-CM | POA: Insufficient documentation

## 2015-12-22 DIAGNOSIS — D509 Iron deficiency anemia, unspecified: Secondary | ICD-10-CM | POA: Diagnosis not present

## 2015-12-22 DIAGNOSIS — M1990 Unspecified osteoarthritis, unspecified site: Secondary | ICD-10-CM | POA: Insufficient documentation

## 2015-12-22 DIAGNOSIS — Z992 Dependence on renal dialysis: Secondary | ICD-10-CM | POA: Diagnosis not present

## 2015-12-22 DIAGNOSIS — R609 Edema, unspecified: Secondary | ICD-10-CM

## 2015-12-22 LAB — COMPREHENSIVE METABOLIC PANEL
ALK PHOS: 57 U/L (ref 38–126)
ALT: <5 U/L — ABNORMAL LOW (ref 17–63)
ANION GAP: 13 (ref 5–15)
AST: 16 U/L (ref 15–41)
Albumin: 3.4 g/dL — ABNORMAL LOW (ref 3.5–5.0)
BILIRUBIN TOTAL: 0.6 mg/dL (ref 0.3–1.2)
BUN: 77 mg/dL — ABNORMAL HIGH (ref 6–20)
CALCIUM: 9.1 mg/dL (ref 8.9–10.3)
CO2: 24 mmol/L (ref 22–32)
CREATININE: 8.85 mg/dL — AB (ref 0.61–1.24)
Chloride: 100 mmol/L — ABNORMAL LOW (ref 101–111)
GFR calc Af Amer: 6 mL/min — ABNORMAL LOW (ref >60)
GFR calc non Af Amer: 5 mL/min — ABNORMAL LOW (ref >60)
GLUCOSE: 231 mg/dL — AB (ref 65–99)
Potassium: 5.2 mmol/L — ABNORMAL HIGH (ref 3.5–5.1)
Sodium: 137 mmol/L (ref 135–145)
Total Protein: 6.4 g/dL — ABNORMAL LOW (ref 6.5–8.1)

## 2015-12-22 LAB — CBC WITH DIFFERENTIAL/PLATELET
Basophils Absolute: 0 10*3/uL (ref 0.0–0.1)
Basophils Relative: 0 %
Eosinophils Absolute: 0.1 10*3/uL (ref 0.0–0.7)
Eosinophils Relative: 4 %
HEMATOCRIT: 26.8 % — AB (ref 39.0–52.0)
HEMOGLOBIN: 8.6 g/dL — AB (ref 13.0–17.0)
LYMPHS ABS: 1.1 10*3/uL (ref 0.7–4.0)
Lymphocytes Relative: 31 %
MCH: 30.9 pg (ref 26.0–34.0)
MCHC: 32.1 g/dL (ref 30.0–36.0)
MCV: 96.4 fL (ref 78.0–100.0)
MONO ABS: 0.3 10*3/uL (ref 0.1–1.0)
MONOS PCT: 9 %
NEUTROS ABS: 2 10*3/uL (ref 1.7–7.7)
NEUTROS PCT: 57 %
Platelets: 77 10*3/uL — ABNORMAL LOW (ref 150–400)
RBC: 2.78 MIL/uL — ABNORMAL LOW (ref 4.22–5.81)
RDW: 15.2 % (ref 11.5–15.5)
WBC: 3.5 10*3/uL — ABNORMAL LOW (ref 4.0–10.5)

## 2015-12-22 NOTE — ED Notes (Signed)
Pt presents from [facility] via [facility] with c/o left arm swelling. Pt has "dialysis shunt" in LUE that has had problems since placement (approx 3-48mos ago).  EMS reports significant pitting edema.  Pt has seen vascular multiple times for the issue.

## 2015-12-22 NOTE — Progress Notes (Signed)
*  PRELIMINARY RESULTS* Vascular Ultrasound Left upper extremity venous duplex has been completed.  Preliminary findings: No evidence of DVT or superficial thrombosis. Unchanged from 12/13/15, also negative study.   Duplex Dialysis Access has been completed. Left arm HD graft appears to be patent with no evidence of perigraft fluid, no evidence of thrombus, and no evidence of pseudoaneurysm.     Farrel Demark, RDMS, RVT  12/22/2015, 4:54 PM

## 2015-12-22 NOTE — ED Notes (Signed)
Notified [facility] for transportation back home 

## 2015-12-22 NOTE — ED Provider Notes (Signed)
CSN: [id number]     Arrival date & time 12/22/15  1435 History   None    Chief Complaint  Patient presents with  . Vascular Access Problem    HPI   [name] is a 82 y.o. male with a PMH of HTN, DM, pulmonary hypertension, ESRD who presents to the ED with left upper extremity swelling. He states this has been persistent since his fistula was created, though notes he was at dialysis earlier this morning and has had increased swelling since that time, prompting his rehab facility to send him to the ED. He denies exacerbating factors. He has not tried anything for symptom relief. He denies pain. He denies fever, chills, chest pain, shortness of breath, abdominal pain, N/V, numbness, weakness, paresthesia. 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   Past Medical History  Diagnosis Date  . Hypertension   . Diabetes mellitus without complication (HCC)   . Arthritis   . History of blood transfusion   . GI bleed   . ESRD (end stage renal disease) (HCC)   . Seizure (HCC)   . Dialysis patient (HCC)   . Subdural hematoma (HCC) 05/2015    from fall  . Sleep apnea     wears CPAP n/c  . Pulmonary hypertension (HCC)     severe pulmonary HTN by 01/14/13 echo   Past Surgical History  Procedure Laterality Date  . Esophagogastroduodenoscopy  10/03/2012    Procedure: ESOPHAGOGASTRODUODENOSCOPY (EGD);  Surgeon: Theda Belfast, MD;  Location: [facility] ENDOSCOPY;  Service: Endoscopy;  Laterality: N/A;  . Colonoscopy  10/04/2012    Procedure: COLONOSCOPY;  Surgeon: Theda Belfast, MD;  Location: [facility] ENDOSCOPY;  Service: Endoscopy;  Laterality: N/A;  . Colonoscopy with esophagogastroduodenoscopy (egd) Left 11/30/2012    Procedure: COLONOSCOPY WITH ESOPHAGOGASTRODUODENOSCOPY (EGD);  Surgeon: Theda Belfast, MD;  Location: [facility] ENDOSCOPY;  Service: Endoscopy;  Laterality: Left;  . Spine surgery  2004    back  fusion/ [facility] Penumalli,MD  . Retinal laser procedure Right 04/2015  . Av fistula placement Left 09/09/2015    Procedure: INSERTION OF ARTERIOVENOUS  GORE-TEX GRAFT Left  ARM;  Surgeon: Sherren Kerns, MD;  Location: [facility] OR;  Service: Vascular;  Laterality: Left;  . Insertion of dialysis catheter Right 11/27/2015    Procedure: INSERTION OF DIALYSIS CATHETER - Right Internal Jugular Placement;  Surgeon: Chuck Hint, MD;  Location: [facility] OR;  Service: Vascular;  Laterality: Right;  . Ep implantable device N/A 12/12/2015    Procedure: Pacemaker Implant;  Surgeon: Will Jorja Loa, MD;  Location: [facility] INVASIVE CV LAB;  Service: Cardiovascular;  Laterality: N/A;   Family History  Problem Relation Age of Onset  . Sudden death Mother   . Sudden death Father    Social History  Substance Use Topics  . Smoking status: Never Smoker   . Smokeless tobacco: Never Used  . Alcohol Use: No     Review of Systems  Constitutional: Negative for fever and chills.  Respiratory: Negative for shortness of breath.   Cardiovascular: Negative for chest pain.  Gastrointestinal: Negative for nausea, vomiting and abdominal pain.  Musculoskeletal: Negative for myalgias.  Neurological: Negative for weakness and numbness.  All other systems reviewed and are negative.     Allergies  Aspirin and Lactose intolerance (gi)  Home Medications   Prior to Admission medications   Medication Sig Start Date  End Date Taking? Authorizing Provider  amLODipine (NORVASC) 5 MG tablet Take 1 tablet (5 mg total) by mouth at bedtime. 12/15/15  Yes Joseph Art, DO  buprenorphine (BUTRANS) 5 MCG/HR PTWK patch Place 1 patch (5 mcg total) onto the skin once a week. Patient taking differently: Place 5 mcg onto the skin every Thursday.  12/05/15  Yes [name] L [name], DO  calcitRIOL (ROCALTROL) 0.5 MCG capsule Take 2 capsules (1 mcg total) by mouth every Monday, Wednesday, and Friday with hemodialysis. 05/23/15  Yes Lonia Blood, MD  calcium acetate (PHOSLO) 667 MG capsule Take 3 capsules by mouth three times a day 07/15/15  Yes Sharon Seller, NP  cinacalcet (SENSIPAR) 30 MG tablet Take 1 tablet (30 mg total) by mouth daily with supper. 09/18/15  Yes Vassie Loll, MD  colchicine 0.6 MG tablet Take 1 tablet (0.6 mg total) by mouth as needed. For gout. Patient needs an appointment before anymore refills. 09/29/15  Yes [name] L [name], DO  divalproex (DEPAKOTE ER) 250 MG 24 hr tablet Take 250 mg by mouth every 12 (twelve) hours.   Yes Historical Provider, MD  fexofenadine (ALLEGRA) 180 MG tablet Take 180 mg by mouth daily as needed for allergies or rhinitis.   Yes Historical Provider, MD  folic acid (FOLVITE) 1 MG tablet Take 1 tablet (1 mg total) by mouth daily. 11/07/15  Yes [name] L [name], DO  gabapentin (NEURONTIN) 100 MG capsule Take 1 capsule (100 mg total) by mouth 3 (three) times daily as needed (pain). 12/14/15  Yes Jessica U Vann, DO  insulin aspart (NOVOLOG) 100 UNIT/ML injection Inject 0-10 Units into the skin 4 (four) times daily. Sliding scale   Yes Historical Provider, MD  Insulin Glargine (TOUJEO SOLOSTAR) 300 UNIT/ML SOPN Inject 17 Units into the skin every morning.   Yes Historical Provider, MD  lacosamide (VIMPAT) 50 MG TABS tablet 150mg  BID on M-W-F (dialysis days); 100mg  BID on Tue-Thu-Sat-Sun (non-dialysis days) Patient taking differently: Take 100-150 mg by mouth 2 (two) times daily. 150mg  BID on M-W-F (dialysis days); 100mg  BID on Tue-Thu-Sat-Sun (non-dialysis days) 11/13/15  Yes Suanne Marker, MD  multivitamin (RENA-VIT) TABS tablet Take 1 tablet by mouth at bedtime. 09/29/13  Yes Jessica U Vann, DO  pantoprazole (PROTONIX) 40 MG tablet Take 1 tablet (40 mg total) by mouth 2 (two) times daily. 10/27/15  Yes Rhetta Mura, MD  sennosides-docusate sodium (SENOKOT-S) 8.6-50 MG tablet Take 1 tablet by mouth every evening.    Yes Historical Provider, MD    BP 129/71 mmHg  Pulse 64  Temp(Src)  97.4 F (36.3 C) (Oral)  Resp 9  SpO2 100% Physical Exam  Constitutional: He is oriented to person, place, and time. He appears well-developed and well-nourished. No distress.  HENT:  Head: Normocephalic and atraumatic.  Right Ear: External ear normal.  Left Ear: External ear normal.  Nose: Nose normal.  Mouth/Throat: Uvula is midline, oropharynx is clear and moist and mucous membranes are normal.  Eyes: Conjunctivae, EOM and lids are normal. Pupils are equal, round, and reactive to light. Right eye exhibits no discharge. Left eye exhibits no discharge. No scleral icterus.  Neck: Normal range of motion. Neck supple.  Cardiovascular: Normal rate, regular rhythm, normal heart sounds, intact distal pulses and normal pulses.   Pulmonary/Chest: Effort normal and breath sounds normal. No respiratory distress. He has no wheezes. He has no rales. He exhibits no tenderness.  Site of pacemaker placement to right anterior chest appears well-healing. No  TTP. No erythema, warmth, or drainage.  Abdominal: Soft. Normal appearance and bowel sounds are normal. He exhibits no distension and no mass. There is no tenderness. There is no rigidity, no rebound and no guarding.  Musculoskeletal: Normal range of motion. He exhibits edema. He exhibits no tenderness.  Diffuse edema to left upper extremity. Palpable thrill to fistula. No TTP. Decreased ROM due to edema. No erythema or heat.   Neurological: He is alert and oriented to person, place, and time. He has normal strength. No sensory deficit.  Skin: Skin is warm, dry and intact. No rash noted. He is not diaphoretic. No erythema. No pallor.  Psychiatric: He has a normal mood and affect. His speech is normal and behavior is normal.  Nursing note and vitals reviewed.   ED Course  Procedures (including critical care time)  Labs Review Labs Reviewed  CBC WITH DIFFERENTIAL/PLATELET - Abnormal; Notable for the following:    WBC 3.5 (*)    RBC 2.78 (*)     Hemoglobin 8.6 (*)    HCT 26.8 (*)    Platelets 77 (*)    All other components within normal limits  COMPREHENSIVE METABOLIC PANEL - Abnormal; Notable for the following:    Potassium 5.2 (*)    Chloride 100 (*)    Glucose, Bld 231 (*)    BUN 77 (*)    Creatinine, Ser 8.85 (*)    Total Protein 6.4 (*)    Albumin 3.4 (*)    ALT <5 (*)    GFR calc non Af Amer 5 (*)    GFR calc Af Amer 6 (*)    All other components within normal limits    Imaging Review No results found.   Vascular Ultrasound Left upper extremity venous duplex has been completed. Preliminary findings: No evidence of DVT or superficial thrombosis. Unchanged from 12/13/15, also negative study. Duplex Dialysis Access has been completed. Left arm HD graft appears to be patent with no evidence of perigraft fluid, no evidence of thrombus, and no evidence of pseudoaneurysm.  I have personally reviewed and evaluated these images and lab results as part of my medical decision-making.   EKG Interpretation   Date/Time:  Monday December 22 2015 18:22:08 EDT Ventricular Rate:  61 PR Interval:  231 QRS Duration: 85 QT Interval:  429 QTC Calculation: 432 R Axis:   39 Text Interpretation:  Sinus rhythm Prolonged PR interval Baseline wander  in lead(s) V2 Partial missing lead(s): V2 Sinus rhythm with 1st degree A-V  block T wave abnormality Abnormal ekg Confirmed by Gerhard Munch  MD  [phone]) on 12/22/2015 6:34:34 PM      MDM   Final diagnoses:  Left arm swelling    82 year old male presents with edema to his left upper extremity, which he states he has at baseline, though worsened today s/p HD, prompting his rehab facility to send him to the ED. He denies pain. He denies fever, chills, chest pain, shortness of breath, abdominal pain, N/V, numbness, weakness, paresthesia.   Patient is afebrile. Vital signs stable. On exam, he has diffuse edema to his left upper extremity. Palpable thrill to fistula. No TTP. Decreased ROM  due to edema. No erythema or heat. Patient is NVI.   Labs pending. Will obtain US of left upper extremity. Patient discussed with and seen by Dr. Jeraldine Loots.  EKG sinus rhythm, HR 61. CBC remarkable for WBC 3.5 and hemoglobin 8.6, which appear chronic. CMP remarkable for potassium 5.2, creatinine 8.85. [place]  negative for DVT, superficial thrombosis, graft thrombus, perigraft fluid, pseudoaneurysm.  Discussed findings with patient. He is non-toxic and well-appearing, feel he is stable for discharge at this time. Low suspicion for infectious etiology. No evidence of thrombosis or abnormality of fistula. Patient to follow-up with PCP and with vascular. Return precautions discussed. Patient verbalizes his understanding and is in agreement with plan.  BP 129/71 mmHg  Pulse 64  Temp(Src) 97.4 F (36.3 C) (Oral)  Resp 9  SpO2 100%     Mady Gemma, PA-C 12/22/15 1838  Gerhard Munch, MD 12/22/15 [phone]

## 2015-12-22 NOTE — Discharge Instructions (Signed)
1. Medications: usual home medications 2. Treatment: rest, drink plenty of fluids 3. Follow Up: please followup with your primary doctor for discussion of your diagnoses and further evaluation after today's visit; if you do not have a primary care doctor use the phone number listed in your discharge paperwork to find one; please return to the ER for severe pain, increased swelling, numbness, weakness, new or worsening symptoms   Edema Edema is an abnormal buildup of fluids. It is more common in your legs and thighs. Painless swelling of the feet and ankles is more likely as a person ages. It also is common in looser skin, like around your eyes. HOME CARE   Keep the affected body part above the level of the heart while lying down.  Do not sit still or stand for a long time.  Do not put anything right under your knees when you lie down.  Do not wear tight clothes on your upper legs.  Exercise your legs to help the puffiness (swelling) go down.  Wear elastic bandages or support stockings as told by your doctor.  A low-salt diet may help lessen the puffiness.  Only take medicine as told by your doctor. GET HELP IF:  Treatment is not working.  You have heart, liver, or kidney disease and notice that your skin looks puffy or shiny.  You have puffiness in your legs that does not get better when you raise your legs.  You have sudden weight gain for no reason. GET HELP RIGHT AWAY IF:   You have shortness of breath or chest pain.  You cannot breathe when you lie down.  You have pain, redness, or warmth in the areas that are puffy.  You have heart, liver, or kidney disease and get edema all of a sudden.  You have a fever and your symptoms get worse all of a sudden. MAKE SURE YOU:   Understand these instructions.  Will watch your condition.  Will get help right away if you are not doing well or get worse.   This information is not intended to replace advice given to you by  your health care provider. Make sure you discuss any questions you have with your health care provider.   Document Released: 02/09/2008 Document Revised: 08/28/2013 Document Reviewed: 06/15/2013 Elsevier Interactive Patient Education [employer]

## 2015-12-23 ENCOUNTER — Ambulatory Visit: Payer: Medicare Other | Admitting: Diagnostic Neuroimaging

## 2015-12-24 ENCOUNTER — Encounter: Payer: Self-pay | Admitting: Vascular Surgery

## 2015-12-24 DIAGNOSIS — D509 Iron deficiency anemia, unspecified: Secondary | ICD-10-CM | POA: Diagnosis not present

## 2015-12-24 DIAGNOSIS — D631 Anemia in chronic kidney disease: Secondary | ICD-10-CM | POA: Diagnosis not present

## 2015-12-24 DIAGNOSIS — N186 End stage renal disease: Secondary | ICD-10-CM | POA: Diagnosis not present

## 2015-12-25 ENCOUNTER — Encounter: Payer: Self-pay | Admitting: Cardiology

## 2015-12-25 DIAGNOSIS — R5381 Other malaise: Secondary | ICD-10-CM | POA: Diagnosis not present

## 2015-12-25 DIAGNOSIS — R2681 Unsteadiness on feet: Secondary | ICD-10-CM | POA: Diagnosis not present

## 2015-12-25 DIAGNOSIS — G3184 Mild cognitive impairment, so stated: Secondary | ICD-10-CM | POA: Diagnosis not present

## 2015-12-25 DIAGNOSIS — M6281 Muscle weakness (generalized): Secondary | ICD-10-CM | POA: Diagnosis not present

## 2015-12-26 DIAGNOSIS — N189 Chronic kidney disease, unspecified: Secondary | ICD-10-CM | POA: Diagnosis not present

## 2015-12-26 DIAGNOSIS — M549 Dorsalgia, unspecified: Secondary | ICD-10-CM

## 2015-12-26 DIAGNOSIS — Z992 Dependence on renal dialysis: Secondary | ICD-10-CM | POA: Diagnosis not present

## 2015-12-26 DIAGNOSIS — D509 Iron deficiency anemia, unspecified: Secondary | ICD-10-CM | POA: Diagnosis not present

## 2015-12-26 DIAGNOSIS — R609 Edema, unspecified: Secondary | ICD-10-CM | POA: Diagnosis not present

## 2015-12-26 DIAGNOSIS — D631 Anemia in chronic kidney disease: Secondary | ICD-10-CM | POA: Diagnosis not present

## 2015-12-26 DIAGNOSIS — Z66 Do not resuscitate: Secondary | ICD-10-CM | POA: Insufficient documentation

## 2015-12-26 DIAGNOSIS — N186 End stage renal disease: Secondary | ICD-10-CM | POA: Diagnosis not present

## 2015-12-26 DIAGNOSIS — G8929 Other chronic pain: Secondary | ICD-10-CM | POA: Insufficient documentation

## 2015-12-29 ENCOUNTER — Ambulatory Visit: Payer: Medicare Other | Admitting: Internal Medicine

## 2015-12-29 DIAGNOSIS — D631 Anemia in chronic kidney disease: Secondary | ICD-10-CM | POA: Diagnosis not present

## 2015-12-29 DIAGNOSIS — D509 Iron deficiency anemia, unspecified: Secondary | ICD-10-CM | POA: Diagnosis not present

## 2015-12-29 DIAGNOSIS — N186 End stage renal disease: Secondary | ICD-10-CM | POA: Diagnosis not present

## 2015-12-30 DIAGNOSIS — G3184 Mild cognitive impairment, so stated: Secondary | ICD-10-CM | POA: Diagnosis not present

## 2015-12-30 DIAGNOSIS — R2681 Unsteadiness on feet: Secondary | ICD-10-CM | POA: Diagnosis not present

## 2015-12-30 DIAGNOSIS — M6281 Muscle weakness (generalized): Secondary | ICD-10-CM | POA: Diagnosis not present

## 2015-12-30 DIAGNOSIS — R5381 Other malaise: Secondary | ICD-10-CM | POA: Diagnosis not present

## 2015-12-31 ENCOUNTER — Ambulatory Visit: Payer: Medicare Other | Admitting: Vascular Surgery

## 2015-12-31 DIAGNOSIS — D631 Anemia in chronic kidney disease: Secondary | ICD-10-CM | POA: Diagnosis not present

## 2015-12-31 DIAGNOSIS — D509 Iron deficiency anemia, unspecified: Secondary | ICD-10-CM | POA: Diagnosis not present

## 2015-12-31 DIAGNOSIS — N186 End stage renal disease: Secondary | ICD-10-CM | POA: Diagnosis not present

## 2016-01-01 ENCOUNTER — Ambulatory Visit: Payer: Medicare Other | Admitting: Vascular Surgery

## 2016-01-02 DIAGNOSIS — D631 Anemia in chronic kidney disease: Secondary | ICD-10-CM | POA: Diagnosis not present

## 2016-01-02 DIAGNOSIS — N186 End stage renal disease: Secondary | ICD-10-CM | POA: Diagnosis not present

## 2016-01-02 DIAGNOSIS — D509 Iron deficiency anemia, unspecified: Secondary | ICD-10-CM | POA: Diagnosis not present

## 2016-01-04 DIAGNOSIS — I120 Hypertensive chronic kidney disease with stage 5 chronic kidney disease or end stage renal disease: Secondary | ICD-10-CM | POA: Diagnosis not present

## 2016-01-04 DIAGNOSIS — Z992 Dependence on renal dialysis: Secondary | ICD-10-CM | POA: Diagnosis not present

## 2016-01-04 DIAGNOSIS — N186 End stage renal disease: Secondary | ICD-10-CM | POA: Diagnosis not present

## 2016-01-05 ENCOUNTER — Other Ambulatory Visit: Payer: Self-pay | Admitting: *Deleted

## 2016-01-05 DIAGNOSIS — E1129 Type 2 diabetes mellitus with other diabetic kidney complication: Secondary | ICD-10-CM | POA: Diagnosis not present

## 2016-01-05 DIAGNOSIS — D689 Coagulation defect, unspecified: Secondary | ICD-10-CM | POA: Diagnosis not present

## 2016-01-05 DIAGNOSIS — D631 Anemia in chronic kidney disease: Secondary | ICD-10-CM | POA: Diagnosis not present

## 2016-01-05 DIAGNOSIS — N186 End stage renal disease: Secondary | ICD-10-CM | POA: Diagnosis not present

## 2016-01-05 DIAGNOSIS — D509 Iron deficiency anemia, unspecified: Secondary | ICD-10-CM | POA: Diagnosis not present

## 2016-01-05 DIAGNOSIS — Z452 Encounter for adjustment and management of vascular access device: Secondary | ICD-10-CM | POA: Diagnosis not present

## 2016-01-05 MED ORDER — LACOSAMIDE 50 MG PO TABS: ORAL_TABLET | ORAL | Status: DC

## 2016-01-05 MED ORDER — CALCIUM ACETATE 667 MG PO CAPS: ORAL_CAPSULE | ORAL | Status: DC

## 2016-01-05 MED ORDER — FOLIC ACID 1 MG PO TABS: 1.0000 mg | ORAL_TABLET | Freq: Every day | ORAL | Status: DC

## 2016-01-05 NOTE — Telephone Encounter (Signed)
[facility]. Patient needs an appointment before anymore refills.  

## 2016-01-07 DIAGNOSIS — D631 Anemia in chronic kidney disease: Secondary | ICD-10-CM | POA: Diagnosis not present

## 2016-01-07 DIAGNOSIS — D689 Coagulation defect, unspecified: Secondary | ICD-10-CM | POA: Diagnosis not present

## 2016-01-07 DIAGNOSIS — E1129 Type 2 diabetes mellitus with other diabetic kidney complication: Secondary | ICD-10-CM | POA: Diagnosis not present

## 2016-01-07 DIAGNOSIS — N186 End stage renal disease: Secondary | ICD-10-CM | POA: Diagnosis not present

## 2016-01-07 DIAGNOSIS — Z452 Encounter for adjustment and management of vascular access device: Secondary | ICD-10-CM | POA: Diagnosis not present

## 2016-01-07 DIAGNOSIS — D509 Iron deficiency anemia, unspecified: Secondary | ICD-10-CM | POA: Diagnosis not present

## 2016-01-08 ENCOUNTER — Other Ambulatory Visit: Payer: Self-pay

## 2016-01-08 NOTE — Telephone Encounter (Signed)
We received a prior authorization request for Vimpat 50 mg tablets. I faxed this request to [facility] at [phone] because they have recently initiated prior authorizations for the same medication that they prescribed.

## 2016-01-09 ENCOUNTER — Encounter: Payer: Self-pay | Admitting: Vascular Surgery

## 2016-01-09 DIAGNOSIS — D689 Coagulation defect, unspecified: Secondary | ICD-10-CM | POA: Diagnosis not present

## 2016-01-09 DIAGNOSIS — D509 Iron deficiency anemia, unspecified: Secondary | ICD-10-CM | POA: Diagnosis not present

## 2016-01-09 DIAGNOSIS — D631 Anemia in chronic kidney disease: Secondary | ICD-10-CM | POA: Diagnosis not present

## 2016-01-09 DIAGNOSIS — Z452 Encounter for adjustment and management of vascular access device: Secondary | ICD-10-CM | POA: Diagnosis not present

## 2016-01-09 DIAGNOSIS — N186 End stage renal disease: Secondary | ICD-10-CM | POA: Diagnosis not present

## 2016-01-09 DIAGNOSIS — E1129 Type 2 diabetes mellitus with other diabetic kidney complication: Secondary | ICD-10-CM | POA: Diagnosis not present

## 2016-01-12 DIAGNOSIS — D631 Anemia in chronic kidney disease: Secondary | ICD-10-CM | POA: Diagnosis not present

## 2016-01-12 DIAGNOSIS — D509 Iron deficiency anemia, unspecified: Secondary | ICD-10-CM | POA: Diagnosis not present

## 2016-01-12 DIAGNOSIS — E1129 Type 2 diabetes mellitus with other diabetic kidney complication: Secondary | ICD-10-CM | POA: Diagnosis not present

## 2016-01-12 DIAGNOSIS — Z452 Encounter for adjustment and management of vascular access device: Secondary | ICD-10-CM | POA: Diagnosis not present

## 2016-01-12 DIAGNOSIS — D689 Coagulation defect, unspecified: Secondary | ICD-10-CM | POA: Diagnosis not present

## 2016-01-12 DIAGNOSIS — N186 End stage renal disease: Secondary | ICD-10-CM | POA: Diagnosis not present

## 2016-01-14 ENCOUNTER — Encounter: Payer: Self-pay | Admitting: Vascular Surgery

## 2016-01-14 ENCOUNTER — Ambulatory Visit: Payer: Medicare Other | Admitting: Vascular Surgery

## 2016-01-14 DIAGNOSIS — D689 Coagulation defect, unspecified: Secondary | ICD-10-CM | POA: Diagnosis not present

## 2016-01-14 DIAGNOSIS — Z452 Encounter for adjustment and management of vascular access device: Secondary | ICD-10-CM | POA: Diagnosis not present

## 2016-01-14 DIAGNOSIS — N186 End stage renal disease: Secondary | ICD-10-CM | POA: Diagnosis not present

## 2016-01-14 DIAGNOSIS — D509 Iron deficiency anemia, unspecified: Secondary | ICD-10-CM | POA: Diagnosis not present

## 2016-01-14 DIAGNOSIS — D631 Anemia in chronic kidney disease: Secondary | ICD-10-CM | POA: Diagnosis not present

## 2016-01-14 DIAGNOSIS — E1129 Type 2 diabetes mellitus with other diabetic kidney complication: Secondary | ICD-10-CM | POA: Diagnosis not present

## 2016-01-16 ENCOUNTER — Ambulatory Visit (INDEPENDENT_AMBULATORY_CARE_PROVIDER_SITE_OTHER): Payer: Medicare Other | Admitting: Vascular Surgery

## 2016-01-16 ENCOUNTER — Encounter: Payer: Self-pay | Admitting: Vascular Surgery

## 2016-01-16 VITALS — BP 149/67 | HR 81 | Temp 97.2°F | Resp 18 | Ht 72.0 in | Wt 200.0 lb

## 2016-01-16 DIAGNOSIS — Z452 Encounter for adjustment and management of vascular access device: Secondary | ICD-10-CM | POA: Diagnosis not present

## 2016-01-16 DIAGNOSIS — D689 Coagulation defect, unspecified: Secondary | ICD-10-CM | POA: Diagnosis not present

## 2016-01-16 DIAGNOSIS — E1129 Type 2 diabetes mellitus with other diabetic kidney complication: Secondary | ICD-10-CM | POA: Diagnosis not present

## 2016-01-16 DIAGNOSIS — D631 Anemia in chronic kidney disease: Secondary | ICD-10-CM | POA: Diagnosis not present

## 2016-01-16 DIAGNOSIS — Z992 Dependence on renal dialysis: Secondary | ICD-10-CM

## 2016-01-16 DIAGNOSIS — N186 End stage renal disease: Secondary | ICD-10-CM

## 2016-01-16 DIAGNOSIS — D509 Iron deficiency anemia, unspecified: Secondary | ICD-10-CM | POA: Diagnosis not present

## 2016-01-16 MED ORDER — CEPHALEXIN 500 MG PO CAPS: 500.0000 mg | ORAL_CAPSULE | Freq: Two times a day (BID) | ORAL | Status: DC

## 2016-01-16 NOTE — Progress Notes (Signed)
Filed Vitals:   01/16/16 1515 01/16/16 1516  BP: 153/66 149/67  Pulse: 81 81  Temp: 97.2 F (36.2 C)   Resp: 18   Height: 6' (1.829 m)   Weight: 200 lb (90.719 kg)   SpO2: 100%

## 2016-01-16 NOTE — Progress Notes (Signed)
 Patient name: [name] MRN: [id number] DOB: 10/19/1931 Sex: male  REASON FOR VISIT: follow up of left arm swelling and left arm graft.  HPI: [name] is a 82 y.o. male who I saw in consultation in the hospital on 11/27/2015. He had a left arm graft placed by Dr. Charles fields on 09/09/2015. The graft has been difficult to stick. He has had previous access attempts in his right arm in [place]. Previous vein mapping showed no suitable cephalic vein in the left or right arm and marginal basilic vein on the left and right side as well. He had significant arm swelling and for this reason I recommended placement of a catheter for dialysis with plans to reevaluate his graft in 4 weeks. He returns for that visit.  He dialyzes on Monday Wednesdays and Fridays. His catheter is working well. They have not been using his upper arm graft. He has persistent swelling in the left arm and also some cellulitis in the forearm. He denies significant pain in the arm.  Current Outpatient Prescriptions  Medication Sig Dispense Refill  . amLODipine (NORVASC) 5 MG tablet Take 1 tablet (5 mg total) by mouth at bedtime.    . buprenorphine (BUTRANS) 5 MCG/HR PTWK patch Place 1 patch (5 mcg total) onto the skin once a week. (Patient taking differently: Place 5 mcg onto the skin every Thursday. ) 4 patch 3  . calcitRIOL (ROCALTROL) 0.5 MCG capsule Take 2 capsules (1 mcg total) by mouth every Monday, Wednesday, and Friday with hemodialysis.    . calcium acetate (PHOSLO) 667 MG capsule Take 3 capsules by mouth three times a day 180 capsule 0  . cinacalcet (SENSIPAR) 30 MG tablet Take 1 tablet (30 mg total) by mouth daily with supper.    . colchicine 0.6 MG tablet Take 1 tablet (0.6 mg total) by mouth as needed. For gout. Patient needs an appointment before anymore refills. 30 tablet 0  . divalproex (DEPAKOTE ER) 250 MG 24 hr tablet Take 250 mg by mouth every 12 (twelve) hours.    . fexofenadine (ALLEGRA) 180 MG  tablet Take 180 mg by mouth daily as needed for allergies or rhinitis.    . folic acid (FOLVITE) 1 MG tablet Take 1 tablet (1 mg total) by mouth daily. 30 tablet 0  . gabapentin (NEURONTIN) 100 MG capsule Take 1 capsule (100 mg total) by mouth 3 (three) times daily as needed (pain). 30 capsule 0  . insulin aspart (NOVOLOG) 100 UNIT/ML injection Inject 0-10 Units into the skin 4 (four) times daily. Sliding scale    . Insulin Glargine (TOUJEO SOLOSTAR) 300 UNIT/ML SOPN Inject 17 Units into the skin every morning.    . lacosamide (VIMPAT) 50 MG TABS tablet 150mg BID on M-W-F (dialysis days); 100mg BID on Tue-Thu-Sat-Sun (non-dialysis days) 180 tablet 0  . multivitamin (RENA-VIT) TABS tablet Take 1 tablet by mouth at bedtime. 30 tablet 0  . pantoprazole (PROTONIX) 40 MG tablet Take 1 tablet (40 mg total) by mouth 2 (two) times daily. 60 tablet 0  . sennosides-docusate sodium (SENOKOT-S) 8.6-50 MG tablet Take 1 tablet by mouth every evening.      No current facility-administered medications for this visit.   Facility-Administered Medications Ordered in Other Visits  Medication Dose Route Frequency Provider Last Rate Last Dose  . Chlorhexidine Gluconate Cloth 2 % PADS 6 each  6 each Topical Once Charles E Fields, MD        REVIEW OF SYSTEMS:  [X] denotes positive   finding, [ ]  denotes negative finding Cardiac  Comments:  Chest pain or chest pressure:    Shortness of breath upon exertion:    Short of breath when lying flat:    Irregular heart rhythm:    Constitutional    Fever or chills:      PHYSICAL EXAM: Filed Vitals:   01/16/16 1515 01/16/16 1516  BP: 153/66 149/67  Pulse: 81 81  Temp: 97.2 F (36.2 C)   Resp: 18   Height: 6' (1.829 m)   Weight: 200 lb (90.719 kg)   SpO2: 100%     GENERAL: The patient is a well-nourished male, in no acute distress. The vital signs are documented above. CARDIOVASCULAR: There is a regular rate and rhythm. PULMONARY: There is good air exchange  bilaterally without wheezing or rales. He has persistent left upper arm swelling mostly in the upper arm. He also has some cellulitis in the forearm. The graft has a good bruit and thrill.  MEDICAL ISSUES:  PERSISTENT LEFT UPPER EXTREMITY SWELLING STATUS POST LEFT UPPER ARM AV GRAFT: Given the persistent swelling in his left upper arm I have recommended that we proceed with a fistulogram and look for a central venous stenosis which might be amenable to venoplasty. The graft is still working well but he has persistent arm swelling. In addition he has some cellulitis in his forearm and I have put him on Keflex for 2 weeks. He dialyzes on Monday Wednesdays and Fridays we'll arrange to have his fistulogram on a Tuesday or Thursday. I have discussed the procedure potential complications with him today and he is agreeable to proceed.  Friday [facility] [place] Beeper: [phone]

## 2016-01-19 DIAGNOSIS — N186 End stage renal disease: Secondary | ICD-10-CM | POA: Diagnosis not present

## 2016-01-19 DIAGNOSIS — D689 Coagulation defect, unspecified: Secondary | ICD-10-CM | POA: Diagnosis not present

## 2016-01-19 DIAGNOSIS — Z452 Encounter for adjustment and management of vascular access device: Secondary | ICD-10-CM | POA: Diagnosis not present

## 2016-01-19 DIAGNOSIS — D509 Iron deficiency anemia, unspecified: Secondary | ICD-10-CM | POA: Diagnosis not present

## 2016-01-19 DIAGNOSIS — D631 Anemia in chronic kidney disease: Secondary | ICD-10-CM | POA: Diagnosis not present

## 2016-01-19 DIAGNOSIS — E1129 Type 2 diabetes mellitus with other diabetic kidney complication: Secondary | ICD-10-CM | POA: Diagnosis not present

## 2016-01-19 NOTE — Progress Notes (Signed)
This encounter was created in error - please disregard.

## 2016-01-20 ENCOUNTER — Encounter: Payer: Medicare Other | Admitting: Cardiology

## 2016-01-20 ENCOUNTER — Encounter: Payer: Self-pay | Admitting: Cardiology

## 2016-01-20 ENCOUNTER — Ambulatory Visit (INDEPENDENT_AMBULATORY_CARE_PROVIDER_SITE_OTHER): Payer: Medicare Other | Admitting: *Deleted

## 2016-01-20 ENCOUNTER — Other Ambulatory Visit: Payer: Self-pay | Admitting: *Deleted

## 2016-01-20 DIAGNOSIS — R001 Bradycardia, unspecified: Secondary | ICD-10-CM | POA: Diagnosis not present

## 2016-01-20 LAB — CUP PACEART INCLINIC DEVICE CHECK
Battery Voltage: 2.79 V
Brady Statistic AP VS Percent: 20 %
Brady Statistic AS VS Percent: 80 %
Date Time Interrogation Session: 20170516163340
Implantable Lead Implant Date: 20170407
Implantable Lead Location: 753859
Lead Channel Impedance Value: 427 Ohm
Lead Channel Impedance Value: 570 Ohm
Lead Channel Pacing Threshold Pulse Width: 0.4 ms
Lead Channel Sensing Intrinsic Amplitude: 15.67 mV
Lead Channel Setting Pacing Amplitude: 3.5 V
Lead Channel Setting Pacing Pulse Width: 0.4 ms
MDC IDC LEAD IMPLANT DT: 20170407
MDC IDC LEAD LOCATION: 753860
MDC IDC MSMT BATTERY IMPEDANCE: 100 Ohm
MDC IDC MSMT BATTERY REMAINING LONGEVITY: 154 mo
MDC IDC MSMT LEADCHNL RA PACING THRESHOLD AMPLITUDE: 1 V
MDC IDC MSMT LEADCHNL RA PACING THRESHOLD PULSEWIDTH: 0.4 ms
MDC IDC MSMT LEADCHNL RA SENSING INTR AMPL: 2.8 mV
MDC IDC MSMT LEADCHNL RV PACING THRESHOLD AMPLITUDE: 0.75 V
MDC IDC SET LEADCHNL RV PACING AMPLITUDE: 3.5 V
MDC IDC SET LEADCHNL RV SENSING SENSITIVITY: 5.6 mV
MDC IDC STAT BRADY AP VP PERCENT: 0 %
MDC IDC STAT BRADY AS VP PERCENT: 0 %

## 2016-01-20 NOTE — Progress Notes (Signed)
Wound check appointment s/p PPM placement 12/12/15. Temporary hemodialysis port in place directly superior to PPM pocket. Wound without redness or edema. Incision edges approximated, wound well healed. Normal device function. Thresholds, sensing, and impedances consistent with implant measurements. Device programmed at 3.5V for extra safety margin until 3 month visit. Histogram distribution appropriate for patient and level of activity. 1 mode switch- < 1 min. No high ventricular rates noted. Patient educated about wound care, arm mobility, lifting restrictions. ROV with Dr. Elberta Fortis 03/16/16.

## 2016-01-21 ENCOUNTER — Other Ambulatory Visit: Payer: Self-pay

## 2016-01-21 DIAGNOSIS — D509 Iron deficiency anemia, unspecified: Secondary | ICD-10-CM | POA: Diagnosis not present

## 2016-01-21 DIAGNOSIS — E1129 Type 2 diabetes mellitus with other diabetic kidney complication: Secondary | ICD-10-CM | POA: Diagnosis not present

## 2016-01-21 DIAGNOSIS — D631 Anemia in chronic kidney disease: Secondary | ICD-10-CM | POA: Diagnosis not present

## 2016-01-21 DIAGNOSIS — N186 End stage renal disease: Secondary | ICD-10-CM | POA: Diagnosis not present

## 2016-01-21 DIAGNOSIS — D689 Coagulation defect, unspecified: Secondary | ICD-10-CM | POA: Diagnosis not present

## 2016-01-21 DIAGNOSIS — Z452 Encounter for adjustment and management of vascular access device: Secondary | ICD-10-CM | POA: Diagnosis not present

## 2016-01-21 MED ORDER — GLUCOSE BLOOD VI STRP: 1.0000 | ORAL_STRIP | Freq: Three times a day (TID) | Status: DC

## 2016-01-21 NOTE — Telephone Encounter (Signed)
Refill sent to pharmacy.   

## 2016-01-23 DIAGNOSIS — E1129 Type 2 diabetes mellitus with other diabetic kidney complication: Secondary | ICD-10-CM | POA: Diagnosis not present

## 2016-01-23 DIAGNOSIS — D509 Iron deficiency anemia, unspecified: Secondary | ICD-10-CM | POA: Diagnosis not present

## 2016-01-23 DIAGNOSIS — D631 Anemia in chronic kidney disease: Secondary | ICD-10-CM | POA: Diagnosis not present

## 2016-01-23 DIAGNOSIS — D689 Coagulation defect, unspecified: Secondary | ICD-10-CM | POA: Diagnosis not present

## 2016-01-23 DIAGNOSIS — N186 End stage renal disease: Secondary | ICD-10-CM | POA: Diagnosis not present

## 2016-01-23 DIAGNOSIS — Z452 Encounter for adjustment and management of vascular access device: Secondary | ICD-10-CM | POA: Diagnosis not present

## 2016-01-26 DIAGNOSIS — D689 Coagulation defect, unspecified: Secondary | ICD-10-CM | POA: Diagnosis not present

## 2016-01-26 DIAGNOSIS — E1129 Type 2 diabetes mellitus with other diabetic kidney complication: Secondary | ICD-10-CM | POA: Diagnosis not present

## 2016-01-26 DIAGNOSIS — Z452 Encounter for adjustment and management of vascular access device: Secondary | ICD-10-CM | POA: Diagnosis not present

## 2016-01-26 DIAGNOSIS — D509 Iron deficiency anemia, unspecified: Secondary | ICD-10-CM | POA: Diagnosis not present

## 2016-01-26 DIAGNOSIS — D631 Anemia in chronic kidney disease: Secondary | ICD-10-CM | POA: Diagnosis not present

## 2016-01-26 DIAGNOSIS — N186 End stage renal disease: Secondary | ICD-10-CM | POA: Diagnosis not present

## 2016-01-27 ENCOUNTER — Encounter (HOSPITAL_COMMUNITY)
Admission: RE | Disposition: A | Discharge status: Home / Self Care | Payer: Self-pay | Source: Ambulatory Visit | Attending: Surgery

## 2016-01-27 ENCOUNTER — Other Ambulatory Visit: Payer: Self-pay | Admitting: *Deleted

## 2016-01-27 ENCOUNTER — Ambulatory Visit (HOSPITAL_COMMUNITY): Payer: Medicare Other | Admitting: Anesthesiology

## 2016-01-27 ENCOUNTER — Encounter (HOSPITAL_COMMUNITY): Payer: Self-pay | Admitting: Surgery

## 2016-01-27 ENCOUNTER — Ambulatory Visit (HOSPITAL_COMMUNITY)
Admission: RE | Admit: 2016-01-27 | Discharge: 2016-01-27 | Disposition: A | Discharge status: Home / Self Care | Payer: Medicare Other | Source: Ambulatory Visit | Attending: Surgery | Admitting: Surgery

## 2016-01-27 DIAGNOSIS — E1122 Type 2 diabetes mellitus with diabetic chronic kidney disease: Secondary | ICD-10-CM | POA: Diagnosis not present

## 2016-01-27 DIAGNOSIS — N186 End stage renal disease: Secondary | ICD-10-CM

## 2016-01-27 DIAGNOSIS — L03114 Cellulitis of left upper limb: Secondary | ICD-10-CM | POA: Insufficient documentation

## 2016-01-27 DIAGNOSIS — I87002 Postthrombotic syndrome without complications of left lower extremity: Secondary | ICD-10-CM | POA: Diagnosis not present

## 2016-01-27 DIAGNOSIS — I120 Hypertensive chronic kidney disease with stage 5 chronic kidney disease or end stage renal disease: Secondary | ICD-10-CM | POA: Diagnosis not present

## 2016-01-27 DIAGNOSIS — Z794 Long term (current) use of insulin: Secondary | ICD-10-CM | POA: Insufficient documentation

## 2016-01-27 DIAGNOSIS — T82898A Other specified complication of vascular prosthetic devices, implants and grafts, initial encounter: Secondary | ICD-10-CM | POA: Diagnosis not present

## 2016-01-27 DIAGNOSIS — M1990 Unspecified osteoarthritis, unspecified site: Secondary | ICD-10-CM | POA: Diagnosis not present

## 2016-01-27 DIAGNOSIS — Z01810 Encounter for preprocedural cardiovascular examination: Secondary | ICD-10-CM

## 2016-01-27 HISTORY — PX: PERIPHERAL VASCULAR CATHETERIZATION: SHX172C

## 2016-01-27 HISTORY — PX: LIGATION ARTERIOVENOUS GORTEX GRAFT: SHX5947

## 2016-01-27 LAB — POCT I-STAT, CHEM 8
BUN: 66 mg/dL — ABNORMAL HIGH (ref 6–20)
CHLORIDE: 104 mmol/L (ref 101–111)
CREATININE: 8 mg/dL — AB (ref 0.61–1.24)
Calcium, Ion: 1.19 mmol/L (ref 1.13–1.30)
GLUCOSE: 125 mg/dL — AB (ref 65–99)
HEMATOCRIT: 39 % (ref 39.0–52.0)
Hemoglobin: 13.3 g/dL (ref 13.0–17.0)
POTASSIUM: 4.9 mmol/L (ref 3.5–5.1)
Sodium: 140 mmol/L (ref 135–145)
TCO2: 27 mmol/L (ref 0–100)

## 2016-01-27 LAB — GLUCOSE, CAPILLARY
GLUCOSE-CAPILLARY: 116 mg/dL — AB (ref 65–99)
GLUCOSE-CAPILLARY: 129 mg/dL — AB (ref 65–99)

## 2016-01-27 SURGERY — LIGATION ARTERIOVENOUS GORTEX GRAFT
Anesthesia: General | Site: Arm Upper | Laterality: Left

## 2016-01-27 SURGERY — A/V SHUNTOGRAM/FISTULAGRAM
Anesthesia: LOCAL | Laterality: Left

## 2016-01-27 MED ORDER — FENTANYL CITRATE (PF) 100 MCG/2ML IJ SOLN
INTRAMUSCULAR | Status: DC | PRN
Start: 2016-01-27 — End: 2016-01-27
  Administered 2016-01-27: 25 ug via INTRAVENOUS
  Administered 2016-01-27: 75 ug via INTRAVENOUS

## 2016-01-27 MED ORDER — DEXTROSE 5 % IV SOLN
1.5000 g | INTRAVENOUS | Status: AC
  Administered 2016-01-27: 1.5 g via INTRAVENOUS
  Filled 2016-01-27: qty 1.5

## 2016-01-27 MED ORDER — PHENOL 1.4 % MT LIQD: 1.0000 | OROMUCOSAL | Status: DC | PRN

## 2016-01-27 MED ORDER — ONDANSETRON HCL 4 MG/2ML IJ SOLN: 4.0000 mg | Freq: Four times a day (QID) | INTRAMUSCULAR | Status: DC | PRN

## 2016-01-27 MED ORDER — SUCCINYLCHOLINE CHLORIDE 20 MG/ML IJ SOLN
INTRAMUSCULAR | Status: DC | PRN
  Administered 2016-01-27: 100 mg via INTRAVENOUS

## 2016-01-27 MED ORDER — LIDOCAINE-EPINEPHRINE (PF) 1 %-1:200000 IJ SOLN
INTRAMUSCULAR | Status: DC | PRN
  Administered 2016-01-27: 6 mL

## 2016-01-27 MED ORDER — ONDANSETRON HCL 4 MG/2ML IJ SOLN
INTRAMUSCULAR | Status: DC | PRN
  Administered 2016-01-27: 4 mg via INTRAVENOUS

## 2016-01-27 MED ORDER — SODIUM CHLORIDE 0.9 % IV SOLN
INTRAVENOUS | Status: DC
  Administered 2016-01-27: 13:00:00 via INTRAVENOUS

## 2016-01-27 MED ORDER — HEPARIN SODIUM (PORCINE) 1000 UNIT/ML IJ SOLN
INTRAMUSCULAR | Status: AC
  Filled 2016-01-27: qty 1

## 2016-01-27 MED ORDER — ACETAMINOPHEN 325 MG PO TABS: 325.0000 mg | ORAL_TABLET | ORAL | Status: DC | PRN

## 2016-01-27 MED ORDER — LIDOCAINE HCL (PF) 1 % IJ SOLN
INTRAMUSCULAR | Status: AC
  Filled 2016-01-27: qty 30

## 2016-01-27 MED ORDER — SODIUM CHLORIDE 0.9 % IV SOLN: 250.0000 mL | INTRAVENOUS | Status: DC | PRN

## 2016-01-27 MED ORDER — SODIUM CHLORIDE 0.9% FLUSH: 3.0000 mL | Freq: Two times a day (BID) | INTRAVENOUS | Status: DC

## 2016-01-27 MED ORDER — DOCUSATE SODIUM 100 MG PO CAPS: 100.0000 mg | ORAL_CAPSULE | Freq: Every day | ORAL | Status: DC

## 2016-01-27 MED ORDER — FENTANYL CITRATE (PF) 250 MCG/5ML IJ SOLN
INTRAMUSCULAR | Status: AC
  Filled 2016-01-27: qty 5

## 2016-01-27 MED ORDER — ACETAMINOPHEN 325 MG RE SUPP: 325.0000 mg | RECTAL | Status: DC | PRN

## 2016-01-27 MED ORDER — LIDOCAINE HCL (PF) 1 % IJ SOLN
INTRAMUSCULAR | Status: DC | PRN
  Administered 2016-01-27: 2 mL via SUBCUTANEOUS

## 2016-01-27 MED ORDER — SODIUM CHLORIDE 0.9% FLUSH: 3.0000 mL | INTRAVENOUS | Status: DC | PRN

## 2016-01-27 MED ORDER — OXYCODONE-ACETAMINOPHEN 5-325 MG PO TABS: 1.0000 | ORAL_TABLET | ORAL | Status: DC | PRN

## 2016-01-27 MED ORDER — SODIUM CHLORIDE 0.9% FLUSH
10.0000 mL | INTRAVENOUS | Status: DC | PRN
  Administered 2016-01-27: 10 mL

## 2016-01-27 MED ORDER — 0.9 % SODIUM CHLORIDE (POUR BTL) OPTIME
TOPICAL | Status: DC | PRN
  Administered 2016-01-27: 1000 mL

## 2016-01-27 MED ORDER — LIDOCAINE-EPINEPHRINE (PF) 1 %-1:200000 IJ SOLN
INTRAMUSCULAR | Status: AC
  Filled 2016-01-27: qty 30

## 2016-01-27 MED ORDER — IODIXANOL 320 MG/ML IV SOLN
INTRAVENOUS | Status: DC | PRN
  Administered 2016-01-27: 30 mL via INTRAVENOUS

## 2016-01-27 MED ORDER — LABETALOL HCL 5 MG/ML IV SOLN: 10.0000 mg | INTRAVENOUS | Status: DC | PRN

## 2016-01-27 MED ORDER — PROPOFOL 10 MG/ML IV BOLUS
INTRAVENOUS | Status: DC | PRN
  Administered 2016-01-27: 20 mg via INTRAVENOUS
  Administered 2016-01-27: 70 mg via INTRAVENOUS

## 2016-01-27 MED ORDER — METOPROLOL TARTRATE 5 MG/5ML IV SOLN: 2.0000 mg | INTRAVENOUS | Status: DC | PRN

## 2016-01-27 MED ORDER — LIDOCAINE HCL (CARDIAC) 20 MG/ML IV SOLN
INTRAVENOUS | Status: DC | PRN
  Administered 2016-01-27: 100 mg via INTRATRACHEAL

## 2016-01-27 MED ORDER — ONDANSETRON HCL 4 MG/2ML IJ SOLN: 4.0000 mg | Freq: Once | INTRAMUSCULAR | Status: DC | PRN

## 2016-01-27 MED ORDER — ALUM & MAG HYDROXIDE-SIMETH 200-200-20 MG/5ML PO SUSP: 15.0000 mL | ORAL | Status: DC | PRN

## 2016-01-27 MED ORDER — HEPARIN (PORCINE) IN NACL 2-0.9 UNIT/ML-% IJ SOLN
INTRAMUSCULAR | Status: DC | PRN
  Administered 2016-01-27: 500 mL via INTRA_ARTERIAL

## 2016-01-27 MED ORDER — HYDRALAZINE HCL 20 MG/ML IJ SOLN: 5.0000 mg | INTRAMUSCULAR | Status: DC | PRN

## 2016-01-27 MED ORDER — FENTANYL CITRATE (PF) 100 MCG/2ML IJ SOLN: 25.0000 ug | INTRAMUSCULAR | Status: DC | PRN

## 2016-01-27 MED ORDER — GUAIFENESIN-DM 100-10 MG/5ML PO SYRP: 15.0000 mL | ORAL_SOLUTION | ORAL | Status: DC | PRN

## 2016-01-27 MED ORDER — PHENYLEPHRINE HCL 10 MG/ML IJ SOLN
INTRAMUSCULAR | Status: DC | PRN
  Administered 2016-01-27: 40 ug via INTRAVENOUS

## 2016-01-27 SURGICAL SUPPLY — 37 items
CANISTER SUCTION 2500CC (MISCELLANEOUS) ×3 IMPLANT
CLIP TI WIDE RED SMALL 24 (CLIP) ×2 IMPLANT
CLIP TI WIDE RED SMALL 6 (CLIP) ×2 IMPLANT
COVER PROBE W GEL 5X96 (DRAPES) ×2 IMPLANT
ELECT REM PT RETURN 9FT ADLT (ELECTROSURGICAL) ×3
ELECTRODE REM PT RTRN 9FT ADLT (ELECTROSURGICAL) ×1 IMPLANT
GAUZE SPONGE 4X4 12PLY STRL (GAUZE/BANDAGES/DRESSINGS) ×3 IMPLANT
GEL ULTRASOUND 20GR AQUASONIC (MISCELLANEOUS) IMPLANT
GLOVE BIO SURGEON STRL SZ7.5 (GLOVE) ×3 IMPLANT
GLOVE BIOGEL PI IND STRL 6.5 (GLOVE) IMPLANT
GLOVE BIOGEL PI IND STRL 7.0 (GLOVE) IMPLANT
GLOVE BIOGEL PI IND STRL 8 (GLOVE) ×1 IMPLANT
GLOVE BIOGEL PI INDICATOR 6.5 (GLOVE) ×2
GLOVE BIOGEL PI INDICATOR 7.0 (GLOVE) ×2
GLOVE BIOGEL PI INDICATOR 8 (GLOVE) ×2
GLOVE SURG SS PI 7.0 STRL IVOR (GLOVE) ×4 IMPLANT
GOWN STRL REUS W/ TWL LRG LVL3 (GOWN DISPOSABLE) ×3 IMPLANT
GOWN STRL REUS W/ TWL XL LVL3 (GOWN DISPOSABLE) IMPLANT
GOWN STRL REUS W/TWL LRG LVL3 (GOWN DISPOSABLE) ×3
GOWN STRL REUS W/TWL XL LVL3 (GOWN DISPOSABLE) ×6
KIT BASIN OR (CUSTOM PROCEDURE TRAY) ×3 IMPLANT
KIT ROOM TURNOVER OR (KITS) ×3 IMPLANT
LIQUID BAND (GAUZE/BANDAGES/DRESSINGS) ×3 IMPLANT
NS IRRIG 1000ML POUR BTL (IV SOLUTION) ×3 IMPLANT
PACK CV ACCESS (CUSTOM PROCEDURE TRAY) ×3 IMPLANT
PAD ARMBOARD 7.5X6 YLW CONV (MISCELLANEOUS) ×6 IMPLANT
SPONGE SURGIFOAM ABS GEL 100 (HEMOSTASIS) IMPLANT
SUT ETHILON 3 0 PS 1 (SUTURE) IMPLANT
SUT PROLENE 6 0 BV (SUTURE) ×2 IMPLANT
SUT SILK 0 TIES 10X30 (SUTURE) ×3 IMPLANT
SUT VIC AB 3-0 SH 27 (SUTURE) ×3
SUT VIC AB 3-0 SH 27X BRD (SUTURE) ×1 IMPLANT
SUT VICRYL 4-0 PS2 18IN ABS (SUTURE) ×3 IMPLANT
SWAB COLLECTION DEVICE MRSA (MISCELLANEOUS) IMPLANT
TUBE ANAEROBIC SPECIMEN COL (MISCELLANEOUS) IMPLANT
UNDERPAD 30X30 INCONTINENT (UNDERPADS AND DIAPERS) ×3 IMPLANT
WATER STERILE IRR 1000ML POUR (IV SOLUTION) ×3 IMPLANT

## 2016-01-27 SURGICAL SUPPLY — 10 items
BAG SNAP BAND KOVER 36X36 (MISCELLANEOUS) ×2 IMPLANT
COVER DOME SNAP 22 D (MISCELLANEOUS) ×2 IMPLANT
COVER PRB 48X5XTLSCP FOLD TPE (BAG) ×1 IMPLANT
COVER PROBE 5X48 (BAG) ×4
KIT MICROINTRODUCER STIFF 5F (SHEATH) ×1 IMPLANT
PROTECTION STATION PRESSURIZED (MISCELLANEOUS) ×2
STATION PROTECTION PRESSURIZED (MISCELLANEOUS) ×1 IMPLANT
STOPCOCK MORSE 400PSI 3WAY (MISCELLANEOUS) ×2 IMPLANT
TRAY PV CATH (CUSTOM PROCEDURE TRAY) ×2 IMPLANT
TUBING CIL FLEX 10 FLL-RA (TUBING) ×2 IMPLANT

## 2016-01-27 NOTE — Op Note (Signed)
    NAME: [name]    MRN: [id number] DOB: 09/28/1931    DATE OF OPERATION: 01/27/2016  PREOP DIAGNOSIS: Venous hypertension left upper extremity  POSTOP DIAGNOSIS: same  PROCEDURE: ligation of left upper arm AV graft  SURGEON: Di Kindle. Edilia Bo, MD, FACS  ASSIST: None  ANESTHESIA: Gen.   EBL: Minimal  INDICATIONS: [name] is a 82 y.o. male who had a left upper arm graft placed on 09/09/2015. He is had arm swelling and today underwent a fistulogram which showed a central venous occlusion on the left. I was asked to ligate his graft. He does have a functioning catheter on the right side.  FINDINGS: left upper arm graft successfully ligated.  TECHNIQUE: The patient was taken to the operating room and received a general anesthetic. The left upper extremity was prepped and draped in the usual sterile fashion. A transverse incision was made over the graft adhere the graft was dissected free and ligated with 2 2-0 silk ties. Hemostasis was obtained in the wound. The wound was closed with the peritoneal Vicryl and the skin closed with 4-0 Vicryl. Liquid bandage was applied. She tolerated the procedure well and was transferred to the recovery room in stable condition. All needle and sponge counts were correct.  CLINICAL NOTE: Previous vein map showed that the cephalic vein in both arms did not appear to be adequate for a fistula. Basilic veins bilaterally were also marginal. He has had previous access attempts in the right arm in [facility].  He will likely need to be evaluated for thigh graft. I will schedule an office visit with ABIs. He dialyzes on Monday Wednesdays and Fridays.  Waverly Ferrari, MD, FACS [facility]  DATE OF DICTATION:   01/27/2016

## 2016-01-27 NOTE — Op Note (Signed)
    Patient name: [name] MRN: [id number] DOB: 1932/05/15 Sex: male  01/27/2016 Pre-operative Diagnosis: End-stage renal disease, left arm swelling Post-operative diagnosis:  Same Surgeon:  Durene Cal Procedure Performed:  1.  Ultrasound-guided access, left upper arm dialysis graft  2.  Shuntogram    Indications:  The patient had a left upper arm graft placed in January 2017.  He has had difficulty with access and also has a swollen left arm.  He is here today for shuntogram  Procedure:  The patient was identified in the holding area and taken to room 8.  The patient was then placed supine on the table and prepped and draped in the usual sterile fashion.  A time out was called.  Ultrasound was used to evaluate the fistula.  The vein was patent and compressible.  A digital ultrasound image was acquired.  The fistula was then accessed under ultrasound guidance using a micropuncture needle.  An 018 wire was then asvanced without resistance and a micropuncture sheath was placed.  Contrast injections were then performed through the sheath.  Findings:  Central venous occlusion.  There is a stent within the left upper arm graft which is widely patent.  The arterial venous anastomosis is widely patent   Intervention:  None  Impression:  #1  Central vein occlusion is the source of his swelling  #2  patient will be scheduled for graft ligation    V. Durene Cal, M.D. [facility] [place] Office: [phone] Pager:  [phone]

## 2016-01-27 NOTE — Interval H&P Note (Signed)
History and Physical Interval Note:  01/27/2016 10:58 AM  [name]  has presented today for surgery, with the diagnosis of pvd  The various methods of treatment have been discussed with the patient and family. After consideration of risks, benefits and other options for treatment, the patient has consented to  Procedure(s): Fistulagram (Left) as a surgical intervention .  The patient's history has been reviewed, patient examined, no change in status, stable for surgery.  I have reviewed the patient's chart and labs.  Questions were answered to the patient's satisfaction.     [name]

## 2016-01-27 NOTE — Progress Notes (Signed)
Sheath removed. Manual pressure applied 20 minutes. Hemostasis achieved. VSS. Will continue to monitor.  [name]

## 2016-01-27 NOTE — Anesthesia Procedure Notes (Signed)
Procedure Name: Intubation Date/Time: 01/27/2016 3:28 PM Performed by: Daiva Eves Pre-anesthesia Checklist: Patient identified, Emergency Drugs available, Suction available, Patient being monitored and Timeout performed Patient Re-evaluated:Patient Re-evaluated prior to inductionOxygen Delivery Method: Circle system utilized Preoxygenation: Pre-oxygenation with 100% oxygen Intubation Type: IV induction Laryngoscope Size: Miller and 2 Tube type: Oral Tube size: 7.5 mm Number of attempts: 1 Airway Equipment and Method: Stylet Placement Confirmation: positive ETCO2,  ETT inserted through vocal cords under direct vision,  breath sounds checked- equal and bilateral and CO2 detector Secured at: 23 cm Tube secured with: Tape Dental Injury: Teeth and Oropharynx as per pre-operative assessment  Comments: RSI  Performed by Frederich Cha CRNA

## 2016-01-27 NOTE — H&P (View-Only) (Signed)
Patient name: [name] MRN: [id number] DOB: 1931/10/21 Sex: male  REASON FOR VISIT: follow up of left arm swelling and left arm graft.  HPI: [name] is a 82 y.o. male who I saw in consultation in the hospital on 11/27/2015. He had a left arm graft placed by Dr. Leonette Most fields on 09/09/2015. The graft has been difficult to stick. He has had previous access attempts in his right arm in [place]. Previous vein mapping showed no suitable cephalic vein in the left or right arm and marginal basilic vein on the left and right side as well. He had significant arm swelling and for this reason I recommended placement of a catheter for dialysis with plans to reevaluate his graft in 4 weeks. He returns for that visit.  He dialyzes on Monday Wednesdays and Fridays. His catheter is working well. They have not been using his upper arm graft. He has persistent swelling in the left arm and also some cellulitis in the forearm. He denies significant pain in the arm.  Current Outpatient Prescriptions  Medication Sig Dispense Refill  . amLODipine (NORVASC) 5 MG tablet Take 1 tablet (5 mg total) by mouth at bedtime.    . buprenorphine (BUTRANS) 5 MCG/HR PTWK patch Place 1 patch (5 mcg total) onto the skin once a week. (Patient taking differently: Place 5 mcg onto the skin every Thursday. ) 4 patch 3  . calcitRIOL (ROCALTROL) 0.5 MCG capsule Take 2 capsules (1 mcg total) by mouth every Monday, Wednesday, and Friday with hemodialysis.    Marland Kitchen calcium acetate (PHOSLO) 667 MG capsule Take 3 capsules by mouth three times a day 180 capsule 0  . cinacalcet (SENSIPAR) 30 MG tablet Take 1 tablet (30 mg total) by mouth daily with supper.    . colchicine 0.6 MG tablet Take 1 tablet (0.6 mg total) by mouth as needed. For gout. Patient needs an appointment before anymore refills. 30 tablet 0  . divalproex (DEPAKOTE ER) 250 MG 24 hr tablet Take 250 mg by mouth every 12 (twelve) hours.    . fexofenadine (ALLEGRA) 180 MG  tablet Take 180 mg by mouth daily as needed for allergies or rhinitis.    . folic acid (FOLVITE) 1 MG tablet Take 1 tablet (1 mg total) by mouth daily. 30 tablet 0  . gabapentin (NEURONTIN) 100 MG capsule Take 1 capsule (100 mg total) by mouth 3 (three) times daily as needed (pain). 30 capsule 0  . insulin aspart (NOVOLOG) 100 UNIT/ML injection Inject 0-10 Units into the skin 4 (four) times daily. Sliding scale    . Insulin Glargine (TOUJEO SOLOSTAR) 300 UNIT/ML SOPN Inject 17 Units into the skin every morning.    . lacosamide (VIMPAT) 50 MG TABS tablet 150mg  BID on M-W-F (dialysis days); 100mg  BID on Tue-Thu-Sat-Sun (non-dialysis days) 180 tablet 0  . multivitamin (RENA-VIT) TABS tablet Take 1 tablet by mouth at bedtime. 30 tablet 0  . pantoprazole (PROTONIX) 40 MG tablet Take 1 tablet (40 mg total) by mouth 2 (two) times daily. 60 tablet 0  . sennosides-docusate sodium (SENOKOT-S) 8.6-50 MG tablet Take 1 tablet by mouth every evening.      No current facility-administered medications for this visit.   Facility-Administered Medications Ordered in Other Visits  Medication Dose Route Frequency Provider Last Rate Last Dose  . Chlorhexidine Gluconate Cloth 2 % PADS 6 each  6 each Topical Once , MD        REVIEW OF SYSTEMS:  [X]  denotes positive  finding, [ ]  denotes negative finding Cardiac  Comments:  Chest pain or chest pressure:    Shortness of breath upon exertion:    Short of breath when lying flat:    Irregular heart rhythm:    Constitutional    Fever or chills:      PHYSICAL EXAM: Filed Vitals:   01/16/16 1515 01/16/16 1516  BP: 153/66 149/67  Pulse: 81 81  Temp: 97.2 F (36.2 C)   Resp: 18   Height: 6' (1.829 m)   Weight: 200 lb (90.719 kg)   SpO2: 100%     GENERAL: The patient is a well-nourished male, in no acute distress. The vital signs are documented above. CARDIOVASCULAR: There is a regular rate and rhythm. PULMONARY: There is good air exchange  bilaterally without wheezing or rales. He has persistent left upper arm swelling mostly in the upper arm. He also has some cellulitis in the forearm. The graft has a good bruit and thrill.  MEDICAL ISSUES:  PERSISTENT LEFT UPPER EXTREMITY SWELLING STATUS POST LEFT UPPER ARM AV GRAFT: Given the persistent swelling in his left upper arm I have recommended that we proceed with a fistulogram and look for a central venous stenosis which might be amenable to venoplasty. The graft is still working well but he has persistent arm swelling. In addition he has some cellulitis in his forearm and I have put him on Keflex for 2 weeks. He dialyzes on Monday Wednesdays and Fridays we'll arrange to have his fistulogram on a Tuesday or Thursday. I have discussed the procedure potential complications with him today and he is agreeable to proceed.  [facility] Beeper: [phone]

## 2016-01-27 NOTE — Interval H&P Note (Signed)
History and Physical Interval Note:  01/27/2016 1:45 PM  [name]  has presented today for surgery, with the diagnosis of ESRD  The various methods of treatment have been discussed with the patient and family. After consideration of risks, benefits and other options for treatment, the patient has consented to  Procedure(s): LIGATION ARTERIOVENOUS GORTEX GRAFT (Left) as a surgical intervention .  The patient's history has been reviewed, patient examined, no change in status, stable for surgery.  I have reviewed the patient's chart and labs.  Questions were answered to the patient's satisfaction.     Waverly Ferrari

## 2016-01-27 NOTE — Transfer of Care (Signed)
Immediate Anesthesia Transfer of Care Note  Patient: [name]  Procedure(s) Performed: Procedure(s): LEFT LIGATION ARTERIOVENOUS GORTEX GRAFT (Left)  Patient Location: PACU  Anesthesia Type:General  Level of Consciousness: awake, alert  and oriented  Airway & Oxygen Therapy: Patient Spontanous Breathing and Patient connected to nasal cannula oxygen  Post-op Assessment: Report given to RN and Post -op Vital signs reviewed and stable  Post vital signs: Reviewed and stable  Last Vitals:  Filed Vitals:   01/27/16 1220 01/27/16 1225  BP: 157/60 155/53  Pulse: 66 64  Temp:    Resp: 12 11    Last Pain:  Filed Vitals:   01/27/16 1226  PainSc: 7       Patients Stated Pain Goal: 3 (01/27/16 0932)  Complications: No apparent anesthesia complications

## 2016-01-27 NOTE — Anesthesia Preprocedure Evaluation (Addendum)
Anesthesia Evaluation  Patient identified by MRN, date of birth, ID band Patient awake and Patient confused    Reviewed: Allergy & Precautions, NPO status , Patient's Chart, lab work & pertinent test results  History of Anesthesia Complications Negative for: history of anesthetic complications  Airway Mallampati: II  TM Distance: >3 FB Neck ROM: Full    Dental  (+) Partial Upper, Missing   Pulmonary sleep apnea and Continuous Positive Airway Pressure Ventilation ,    Pulmonary exam normal breath sounds clear to auscultation       Cardiovascular hypertension, Pt. on medications (-) angina+CHF  (-) Past MI  Rhythm:Regular Rate:Normal     Neuro/Psych Seizures -, Well Controlled,  H/o subdural  Neuromuscular disease negative psych ROS   GI/Hepatic Neg liver ROS, GERD  Medicated,  Endo/Other  diabetes, Type 2, Insulin Dependent  Renal/GU ESRF and DialysisRenal disease (K+ 4.9)     Musculoskeletal  (+) Arthritis , Osteoarthritis,    Abdominal   Peds  Hematology negative hematology ROS (+)   Anesthesia Other Findings Day of surgery medications reviewed with the patient.  Reproductive/Obstetrics                            Anesthesia Physical Anesthesia Plan  ASA: III  Anesthesia Plan: General   Post-op Pain Management:    Induction: Intravenous  Airway Management Planned: Oral ETT  Additional Equipment:   Intra-op Plan:   Post-operative Plan: Extubation in OR  Informed Consent: I have reviewed the patients History and Physical, chart, labs and discussed the procedure including the risks, benefits and alternatives for the proposed anesthesia with the patient or authorized representative who has indicated his/her understanding and acceptance.   Dental advisory given  Plan Discussed with: CRNA  Anesthesia Plan Comments: (Risks/benefits of general anesthesia discussed with patient  including risk of damage to teeth, lips, gum, and tongue, nausea/vomiting, allergic reactions to medications, and the possibility of heart attack, stroke and death.  All patient questions answered.  Patient wishes to proceed.)        Anesthesia Quick Evaluation

## 2016-01-28 ENCOUNTER — Telehealth: Payer: Self-pay

## 2016-01-28 ENCOUNTER — Telehealth: Payer: Self-pay | Admitting: *Deleted

## 2016-01-28 ENCOUNTER — Encounter (HOSPITAL_COMMUNITY): Payer: Self-pay | Admitting: Vascular Surgery

## 2016-01-28 ENCOUNTER — Telehealth: Payer: Self-pay | Admitting: Vascular Surgery

## 2016-01-28 DIAGNOSIS — E1129 Type 2 diabetes mellitus with other diabetic kidney complication: Secondary | ICD-10-CM | POA: Diagnosis not present

## 2016-01-28 DIAGNOSIS — D631 Anemia in chronic kidney disease: Secondary | ICD-10-CM | POA: Diagnosis not present

## 2016-01-28 DIAGNOSIS — N186 End stage renal disease: Secondary | ICD-10-CM | POA: Diagnosis not present

## 2016-01-28 DIAGNOSIS — Z452 Encounter for adjustment and management of vascular access device: Secondary | ICD-10-CM | POA: Diagnosis not present

## 2016-01-28 DIAGNOSIS — D509 Iron deficiency anemia, unspecified: Secondary | ICD-10-CM | POA: Diagnosis not present

## 2016-01-28 DIAGNOSIS — D689 Coagulation defect, unspecified: Secondary | ICD-10-CM | POA: Diagnosis not present

## 2016-01-28 MED FILL — Heparin Sodium (Porcine) Inj 1000 Unit/ML: INTRAMUSCULAR | Qty: 10 | Status: AC

## 2016-01-28 NOTE — Telephone Encounter (Signed)
CSD off for the next two weeks. Sched lab 6/13 at 4 and MD 6/16 at 2. Spoke to pt's son (POA) to inform them of appt.

## 2016-01-28 NOTE — Telephone Encounter (Signed)
Message For: OFFICE               Taken 24-MAY-17 at  2:19PM by DEF ------------------------------------------------------------ Tenna Delaine W/[facility]   CID [id number]  Patient [name]         Pt's Dr [place] [facility]    Area Code 336 Phone# [phone] * DOB [phone]    RE WHICH OFFICE IS MANAGING MEDICATION/LOOKS LIKE    BOTH ARE WRITING RX'S                                Disp:Y/N N If Y = C/B If No Response In ============================================================

## 2016-01-28 NOTE — Telephone Encounter (Signed)
-----   Message from Sharee Pimple, RN sent at 01/28/2016 10:00 AM EDT ----- Regarding: RE: schedule Next available if possible, we need to schedule another surgery ----- Message -----    From: Jena Gauss    Sent: 01/28/2016   7:59 AM      To: Sharee Pimple, RN Subject: RE: schedule                                   Is this next available or 6 w out?  ----- Message -----    From: Sharee Pimple, RN    Sent: 01/27/2016   4:06 PM      To: [facility] Admin Pool Subject: schedule                                         ----- Message -----    From: Chuck Hint, MD    Sent: 01/27/2016   3:14 PM      To: [facility] Charge Pool Subject: charge                                         PROCEDURE: ligation of left upper arm AV graft  SURGEON: Di Kindle. Edilia Bo, MD, FACS  ASSIST: None He dialyzes on Monday Wednesdays and Fridays and has a catheter. He will need to be evaluated for placement of a thigh graft. He'll need an office visit and ABIs at that time. Thanks CD

## 2016-01-28 NOTE — Anesthesia Postprocedure Evaluation (Signed)
Anesthesia Post Note  Patient: [name]  Procedure(s) Performed: Procedure(s) (LRB): LEFT LIGATION ARTERIOVENOUS GORTEX GRAFT (Left)  Patient location during evaluation: PACU Anesthesia Type: General Level of consciousness: awake and alert Pain management: pain level controlled Vital Signs Assessment: post-procedure vital signs reviewed and stable Respiratory status: spontaneous breathing, nonlabored ventilation, respiratory function stable and patient connected to nasal cannula oxygen Cardiovascular status: blood pressure returned to baseline and stable Postop Assessment: no signs of nausea or vomiting Anesthetic complications: no    Last Vitals:  Filed Vitals:   01/27/16 1625 01/27/16 1655  BP: 149/71 150/80  Pulse:    Temp:  36.4 C  Resp:      Last Pain:  Filed Vitals:   01/27/16 1715  PainSc: 7                  Cecile Hearing

## 2016-01-28 NOTE — Telephone Encounter (Signed)
Spoke with Aurther Loft, [facility] who stated she wanted to be certain patient wasn't getting Vimpat prescription from Dr Marjory Lies. Informed her that patient was scheduled to follow up in April following hospital stay. Appointment was cancelled via automated system, and pt does not have FU rescheduled. Last seen here 06/2015. She stated patient was seen in their office March 2017, and Dr Ewell Poe prescribed Vimpat on 01/05/16. She stated she will go ahead and get PA on medication for patient. She verbalized understanding, appreciation of call back.

## 2016-01-28 NOTE — Telephone Encounter (Signed)
I got a return call from [facility] and they stated that they were not going to provide any more prescriptions to the patient because the patient keeps cancelling his appointment and he has not been seen in their office since Oct of 2016.    His last OV here was 12/05/15. A note was attached to the last refill for Vimpat on 01-05-16 that stated the patient would not get anymore refills from this office until he was seen in the office again.   I will initiated the prior authorization for the last refill via [email or web address]. Keyword [id number].   Awaiting determination.

## 2016-01-28 NOTE — Telephone Encounter (Signed)
I called [facility] at [phone] in order to find out which office will be managing prescriptions for patient's Vimpat 50 mg tablets. This office and their office have received prior authorization requests for the same medication and both offices have provided prescriptions for the same medication.   I spoke with Eunice Blase and she stated that she would forward a note to the clinical staff for someone to call about this. Now awaiting response.

## 2016-01-29 NOTE — Telephone Encounter (Signed)
Received fax from [facility] #[id number] and Vimpat 50mg  is APPROVED through 09/05/2016 Patient ID#: 09/07/[id number] Reference #: [id number]

## 2016-01-30 DIAGNOSIS — D631 Anemia in chronic kidney disease: Secondary | ICD-10-CM | POA: Diagnosis not present

## 2016-01-30 DIAGNOSIS — N186 End stage renal disease: Secondary | ICD-10-CM | POA: Diagnosis not present

## 2016-01-30 DIAGNOSIS — Z452 Encounter for adjustment and management of vascular access device: Secondary | ICD-10-CM | POA: Diagnosis not present

## 2016-01-30 DIAGNOSIS — E1129 Type 2 diabetes mellitus with other diabetic kidney complication: Secondary | ICD-10-CM | POA: Diagnosis not present

## 2016-01-30 DIAGNOSIS — D689 Coagulation defect, unspecified: Secondary | ICD-10-CM | POA: Diagnosis not present

## 2016-01-30 DIAGNOSIS — D509 Iron deficiency anemia, unspecified: Secondary | ICD-10-CM | POA: Diagnosis not present

## 2016-02-02 DIAGNOSIS — D509 Iron deficiency anemia, unspecified: Secondary | ICD-10-CM | POA: Diagnosis not present

## 2016-02-02 DIAGNOSIS — Z452 Encounter for adjustment and management of vascular access device: Secondary | ICD-10-CM | POA: Diagnosis not present

## 2016-02-02 DIAGNOSIS — E1129 Type 2 diabetes mellitus with other diabetic kidney complication: Secondary | ICD-10-CM | POA: Diagnosis not present

## 2016-02-02 DIAGNOSIS — N186 End stage renal disease: Secondary | ICD-10-CM | POA: Diagnosis not present

## 2016-02-02 DIAGNOSIS — D689 Coagulation defect, unspecified: Secondary | ICD-10-CM | POA: Diagnosis not present

## 2016-02-02 DIAGNOSIS — D631 Anemia in chronic kidney disease: Secondary | ICD-10-CM | POA: Diagnosis not present

## 2016-02-04 DIAGNOSIS — Z452 Encounter for adjustment and management of vascular access device: Secondary | ICD-10-CM | POA: Diagnosis not present

## 2016-02-04 DIAGNOSIS — I120 Hypertensive chronic kidney disease with stage 5 chronic kidney disease or end stage renal disease: Secondary | ICD-10-CM | POA: Diagnosis not present

## 2016-02-04 DIAGNOSIS — D689 Coagulation defect, unspecified: Secondary | ICD-10-CM | POA: Diagnosis not present

## 2016-02-04 DIAGNOSIS — D509 Iron deficiency anemia, unspecified: Secondary | ICD-10-CM | POA: Diagnosis not present

## 2016-02-04 DIAGNOSIS — D631 Anemia in chronic kidney disease: Secondary | ICD-10-CM | POA: Diagnosis not present

## 2016-02-04 DIAGNOSIS — E1129 Type 2 diabetes mellitus with other diabetic kidney complication: Secondary | ICD-10-CM | POA: Diagnosis not present

## 2016-02-04 DIAGNOSIS — N186 End stage renal disease: Secondary | ICD-10-CM | POA: Diagnosis not present

## 2016-02-04 DIAGNOSIS — Z992 Dependence on renal dialysis: Secondary | ICD-10-CM | POA: Diagnosis not present

## 2016-02-06 ENCOUNTER — Other Ambulatory Visit: Payer: Self-pay

## 2016-02-06 DIAGNOSIS — Z452 Encounter for adjustment and management of vascular access device: Secondary | ICD-10-CM | POA: Diagnosis not present

## 2016-02-06 DIAGNOSIS — N186 End stage renal disease: Secondary | ICD-10-CM | POA: Diagnosis not present

## 2016-02-06 DIAGNOSIS — M5136 Other intervertebral disc degeneration, lumbar region: Secondary | ICD-10-CM

## 2016-02-06 DIAGNOSIS — D509 Iron deficiency anemia, unspecified: Secondary | ICD-10-CM | POA: Diagnosis not present

## 2016-02-06 DIAGNOSIS — D689 Coagulation defect, unspecified: Secondary | ICD-10-CM | POA: Diagnosis not present

## 2016-02-06 DIAGNOSIS — D631 Anemia in chronic kidney disease: Secondary | ICD-10-CM | POA: Diagnosis not present

## 2016-02-06 MED ORDER — LACOSAMIDE 50 MG PO TABS: ORAL_TABLET | ORAL | Status: DC

## 2016-02-06 MED ORDER — CALCITRIOL 0.5 MCG PO CAPS: 1.0000 ug | ORAL_CAPSULE | ORAL | Status: DC

## 2016-02-06 MED ORDER — PANTOPRAZOLE SODIUM 40 MG PO TBEC: 40.0000 mg | DELAYED_RELEASE_TABLET | Freq: Two times a day (BID) | ORAL | Status: DC

## 2016-02-06 MED ORDER — SENNA-DOCUSATE SODIUM 8.6-50 MG PO TABS: 1.0000 | ORAL_TABLET | Freq: Every evening | ORAL | Status: DC

## 2016-02-06 MED ORDER — DIVALPROEX SODIUM ER 250 MG PO TB24
250.0000 mg | ORAL_TABLET | Freq: Two times a day (BID) | ORAL | Status: DC
Start: 2016-02-06 — End: 2016-02-26

## 2016-02-06 MED ORDER — GABAPENTIN 100 MG PO CAPS: 100.0000 mg | ORAL_CAPSULE | Freq: Three times a day (TID) | ORAL | Status: DC | PRN

## 2016-02-06 MED ORDER — RENA-VITE PO TABS: 1.0000 | ORAL_TABLET | Freq: Every day | ORAL | Status: DC

## 2016-02-06 NOTE — Telephone Encounter (Signed)
Patient's daughter in law, Kemuel Buchmann, called to request refills for patient medications. Patient is seen by Dr. Renato Gails, however, the patient has not been seen since 12/05/15. Patient was placed in [facility] 4-10 through 4-29 for rehab from hospital stay. Patient did not schedule a follow up appointment with Dr. Renato Gails and now patient is completely out of medications.   Patient was scheduled for an appointment on 02/26/16 at 8:30 am with Dr. Renato Gails. Refills for the following medications were sent to pharmacy with only enough of each medication to last until the day of appointment. Protonix, gabapentin, depakote, calcitriol, rena-vit, vimat, senakot.   The importance of keeping this appointment was stressed to the daughter in law, [name], and she verbalized understanding.

## 2016-02-09 ENCOUNTER — Other Ambulatory Visit: Payer: Self-pay | Admitting: *Deleted

## 2016-02-09 DIAGNOSIS — Z452 Encounter for adjustment and management of vascular access device: Secondary | ICD-10-CM | POA: Diagnosis not present

## 2016-02-09 DIAGNOSIS — D509 Iron deficiency anemia, unspecified: Secondary | ICD-10-CM | POA: Diagnosis not present

## 2016-02-09 DIAGNOSIS — N186 End stage renal disease: Secondary | ICD-10-CM | POA: Diagnosis not present

## 2016-02-09 DIAGNOSIS — D689 Coagulation defect, unspecified: Secondary | ICD-10-CM | POA: Diagnosis not present

## 2016-02-09 DIAGNOSIS — D631 Anemia in chronic kidney disease: Secondary | ICD-10-CM | POA: Diagnosis not present

## 2016-02-09 MED ORDER — AMLODIPINE BESYLATE 5 MG PO TABS: 5.0000 mg | ORAL_TABLET | Freq: Every day | ORAL | Status: DC

## 2016-02-09 NOTE — Telephone Encounter (Signed)
[facility] [address] 

## 2016-02-11 DIAGNOSIS — N186 End stage renal disease: Secondary | ICD-10-CM | POA: Diagnosis not present

## 2016-02-11 DIAGNOSIS — Z452 Encounter for adjustment and management of vascular access device: Secondary | ICD-10-CM | POA: Diagnosis not present

## 2016-02-11 DIAGNOSIS — D509 Iron deficiency anemia, unspecified: Secondary | ICD-10-CM | POA: Diagnosis not present

## 2016-02-11 DIAGNOSIS — D689 Coagulation defect, unspecified: Secondary | ICD-10-CM | POA: Diagnosis not present

## 2016-02-11 DIAGNOSIS — D631 Anemia in chronic kidney disease: Secondary | ICD-10-CM | POA: Diagnosis not present

## 2016-02-12 DIAGNOSIS — Z7189 Other specified counseling: Secondary | ICD-10-CM | POA: Insufficient documentation

## 2016-02-13 DIAGNOSIS — N186 End stage renal disease: Secondary | ICD-10-CM | POA: Diagnosis not present

## 2016-02-13 DIAGNOSIS — D631 Anemia in chronic kidney disease: Secondary | ICD-10-CM | POA: Diagnosis not present

## 2016-02-13 DIAGNOSIS — Z452 Encounter for adjustment and management of vascular access device: Secondary | ICD-10-CM | POA: Diagnosis not present

## 2016-02-13 DIAGNOSIS — D689 Coagulation defect, unspecified: Secondary | ICD-10-CM | POA: Diagnosis not present

## 2016-02-13 DIAGNOSIS — D509 Iron deficiency anemia, unspecified: Secondary | ICD-10-CM | POA: Diagnosis not present

## 2016-02-16 ENCOUNTER — Encounter: Payer: Self-pay | Admitting: Vascular Surgery

## 2016-02-16 ENCOUNTER — Encounter (HOSPITAL_COMMUNITY): Payer: Medicare Other

## 2016-02-16 DIAGNOSIS — D631 Anemia in chronic kidney disease: Secondary | ICD-10-CM | POA: Diagnosis not present

## 2016-02-16 DIAGNOSIS — D509 Iron deficiency anemia, unspecified: Secondary | ICD-10-CM | POA: Diagnosis not present

## 2016-02-16 DIAGNOSIS — Z452 Encounter for adjustment and management of vascular access device: Secondary | ICD-10-CM | POA: Diagnosis not present

## 2016-02-16 DIAGNOSIS — N186 End stage renal disease: Secondary | ICD-10-CM | POA: Diagnosis not present

## 2016-02-16 DIAGNOSIS — D689 Coagulation defect, unspecified: Secondary | ICD-10-CM | POA: Diagnosis not present

## 2016-02-17 ENCOUNTER — Ambulatory Visit: Payer: Medicare Other | Admitting: Podiatry

## 2016-02-17 ENCOUNTER — Ambulatory Visit (HOSPITAL_COMMUNITY): Admission: RE | Admit: 2016-02-17 | Payer: Medicare Other | Source: Ambulatory Visit

## 2016-02-18 DIAGNOSIS — Z452 Encounter for adjustment and management of vascular access device: Secondary | ICD-10-CM | POA: Diagnosis not present

## 2016-02-18 DIAGNOSIS — D631 Anemia in chronic kidney disease: Secondary | ICD-10-CM | POA: Diagnosis not present

## 2016-02-18 DIAGNOSIS — D689 Coagulation defect, unspecified: Secondary | ICD-10-CM | POA: Diagnosis not present

## 2016-02-18 DIAGNOSIS — D509 Iron deficiency anemia, unspecified: Secondary | ICD-10-CM | POA: Diagnosis not present

## 2016-02-18 DIAGNOSIS — N186 End stage renal disease: Secondary | ICD-10-CM | POA: Diagnosis not present

## 2016-02-19 ENCOUNTER — Encounter (HOSPITAL_COMMUNITY): Payer: Medicare Other

## 2016-02-20 ENCOUNTER — Ambulatory Visit: Payer: Medicare Other | Admitting: Vascular Surgery

## 2016-02-20 DIAGNOSIS — D509 Iron deficiency anemia, unspecified: Secondary | ICD-10-CM | POA: Diagnosis not present

## 2016-02-20 DIAGNOSIS — D631 Anemia in chronic kidney disease: Secondary | ICD-10-CM | POA: Diagnosis not present

## 2016-02-20 DIAGNOSIS — D689 Coagulation defect, unspecified: Secondary | ICD-10-CM | POA: Diagnosis not present

## 2016-02-20 DIAGNOSIS — N186 End stage renal disease: Secondary | ICD-10-CM | POA: Diagnosis not present

## 2016-02-20 DIAGNOSIS — Z452 Encounter for adjustment and management of vascular access device: Secondary | ICD-10-CM | POA: Diagnosis not present

## 2016-02-23 DIAGNOSIS — D631 Anemia in chronic kidney disease: Secondary | ICD-10-CM | POA: Diagnosis not present

## 2016-02-23 DIAGNOSIS — Z452 Encounter for adjustment and management of vascular access device: Secondary | ICD-10-CM | POA: Diagnosis not present

## 2016-02-23 DIAGNOSIS — D689 Coagulation defect, unspecified: Secondary | ICD-10-CM | POA: Diagnosis not present

## 2016-02-23 DIAGNOSIS — D509 Iron deficiency anemia, unspecified: Secondary | ICD-10-CM | POA: Diagnosis not present

## 2016-02-23 DIAGNOSIS — N186 End stage renal disease: Secondary | ICD-10-CM | POA: Diagnosis not present

## 2016-02-25 DIAGNOSIS — D631 Anemia in chronic kidney disease: Secondary | ICD-10-CM | POA: Diagnosis not present

## 2016-02-25 DIAGNOSIS — D509 Iron deficiency anemia, unspecified: Secondary | ICD-10-CM | POA: Diagnosis not present

## 2016-02-25 DIAGNOSIS — N186 End stage renal disease: Secondary | ICD-10-CM | POA: Diagnosis not present

## 2016-02-25 DIAGNOSIS — Z452 Encounter for adjustment and management of vascular access device: Secondary | ICD-10-CM | POA: Diagnosis not present

## 2016-02-25 DIAGNOSIS — D689 Coagulation defect, unspecified: Secondary | ICD-10-CM | POA: Diagnosis not present

## 2016-02-26 ENCOUNTER — Encounter: Payer: Self-pay | Admitting: Internal Medicine

## 2016-02-26 ENCOUNTER — Ambulatory Visit (INDEPENDENT_AMBULATORY_CARE_PROVIDER_SITE_OTHER): Payer: Medicare Other | Admitting: Internal Medicine

## 2016-02-26 VITALS — BP 130/70 | HR 74 | Temp 98.6°F | Wt 198.0 lb

## 2016-02-26 DIAGNOSIS — G40909 Epilepsy, unspecified, not intractable, without status epilepticus: Secondary | ICD-10-CM

## 2016-02-26 DIAGNOSIS — M545 Low back pain, unspecified: Secondary | ICD-10-CM

## 2016-02-26 DIAGNOSIS — S065XAA Traumatic subdural hemorrhage with loss of consciousness status unknown, initial encounter: Secondary | ICD-10-CM

## 2016-02-26 DIAGNOSIS — R531 Weakness: Secondary | ICD-10-CM | POA: Diagnosis not present

## 2016-02-26 DIAGNOSIS — M7989 Other specified soft tissue disorders: Secondary | ICD-10-CM | POA: Diagnosis not present

## 2016-02-26 DIAGNOSIS — R001 Bradycardia, unspecified: Secondary | ICD-10-CM

## 2016-02-26 DIAGNOSIS — I5032 Chronic diastolic (congestive) heart failure: Secondary | ICD-10-CM | POA: Diagnosis not present

## 2016-02-26 DIAGNOSIS — S065X9A Traumatic subdural hemorrhage with loss of consciousness of unspecified duration, initial encounter: Secondary | ICD-10-CM

## 2016-02-26 DIAGNOSIS — Z992 Dependence on renal dialysis: Secondary | ICD-10-CM

## 2016-02-26 DIAGNOSIS — Z950 Presence of cardiac pacemaker: Secondary | ICD-10-CM

## 2016-02-26 DIAGNOSIS — E1121 Type 2 diabetes mellitus with diabetic nephropathy: Secondary | ICD-10-CM

## 2016-02-26 DIAGNOSIS — M5136 Other intervertebral disc degeneration, lumbar region: Secondary | ICD-10-CM

## 2016-02-26 DIAGNOSIS — I6200 Nontraumatic subdural hemorrhage, unspecified: Secondary | ICD-10-CM

## 2016-02-26 DIAGNOSIS — M51369 Other intervertebral disc degeneration, lumbar region without mention of lumbar back pain or lower extremity pain: Secondary | ICD-10-CM

## 2016-02-26 DIAGNOSIS — N186 End stage renal disease: Secondary | ICD-10-CM

## 2016-02-26 MED ORDER — AMLODIPINE BESYLATE 5 MG PO TABS: 5.0000 mg | ORAL_TABLET | Freq: Every day | ORAL | Status: AC

## 2016-02-26 MED ORDER — CALCITRIOL 0.5 MCG PO CAPS: 1.0000 ug | ORAL_CAPSULE | ORAL | Status: AC

## 2016-02-26 MED ORDER — PANTOPRAZOLE SODIUM 40 MG PO TBEC: 40.0000 mg | DELAYED_RELEASE_TABLET | Freq: Two times a day (BID) | ORAL | Status: AC

## 2016-02-26 MED ORDER — SENNA-DOCUSATE SODIUM 8.6-50 MG PO TABS: 1.0000 | ORAL_TABLET | Freq: Every evening | ORAL | Status: AC

## 2016-02-26 MED ORDER — COLCHICINE 0.6 MG PO TABS: 0.6000 mg | ORAL_TABLET | ORAL | Status: DC | PRN

## 2016-02-26 MED ORDER — LACOSAMIDE 50 MG PO TABS: ORAL_TABLET | ORAL | Status: DC

## 2016-02-26 MED ORDER — FEXOFENADINE HCL 180 MG PO TABS: 180.0000 mg | ORAL_TABLET | Freq: Every day | ORAL | Status: AC | PRN

## 2016-02-26 MED ORDER — GABAPENTIN 100 MG PO CAPS: 100.0000 mg | ORAL_CAPSULE | Freq: Three times a day (TID) | ORAL | Status: AC

## 2016-02-26 MED ORDER — FOLIC ACID 1 MG PO TABS: 1.0000 mg | ORAL_TABLET | Freq: Every day | ORAL | Status: AC

## 2016-02-26 MED ORDER — BUPRENORPHINE 5 MCG/HR TD PTWK: 5.0000 ug | MEDICATED_PATCH | TRANSDERMAL | Status: DC

## 2016-02-26 MED ORDER — CALCIUM ACETATE 667 MG PO CAPS: ORAL_CAPSULE | ORAL | Status: AC

## 2016-02-26 MED ORDER — DIVALPROEX SODIUM ER 250 MG PO TB24: 250.0000 mg | ORAL_TABLET | Freq: Two times a day (BID) | ORAL | Status: DC

## 2016-02-26 MED ORDER — RENA-VITE PO TABS: 1.0000 | ORAL_TABLET | Freq: Every day | ORAL | Status: AC

## 2016-02-26 NOTE — Progress Notes (Signed)
Location:  [facility] clinic Provider:  Kennethia Lynes L. Renato Gails, D.O., C.M.D.  Code Status: DNR Goals of Care:  Advanced Directives 02/26/2016  Does patient have an advance directive? Yes  Type of Advance Directive Healthcare Power of Attorney  Does patient want to make changes to advanced directive? -  Copy of advanced directive(s) in chart? -   Chief Complaint  Patient presents with  . Medical Management of Chronic Issues    follow-up on medications, pain control, daughter in-law with patient    HPI: Patient is a 82 y.o. male seen today for medical management of chronic diseases.   Butrans--I had prescribed just before his surgery on his left arm--since then, he's had a pacemaker placed and been hospitalized 2 more times.  It's unclear what happened to the butrans, but I suspect he actually got in in snf.  We have no documentation in our system to suggest it was denied or that any kind of prior auth paperwork was done.    His back hurts in the dialysis chair.  He cannot take tramadol.  Cannot take long acting due to accumulation.  He is using a salonpas with lidocaine for his back at HD.   It helps some. When he used percocet for pain after his left arm surgery, then he got very "loopy" and sleepy.    No PT, OT have come out since his last hospitalization.    Finally had left ligation of arteriovenous graft on 5/23 by Dr. Edilia Bo.   Previous hospitalization when pacemaker placed (12/12/15) for symptomatic bradycardia, therapy was going to happen and then he had the left arm swelling and surgery so never started up.      Since he's been home, Marthann Schiller and his wife have taken over his meds and they've been taking him to HD.    DMII:  Sugars have been going per his DIL.  She reports he has not required any insulin since he returned home from the nursing home.  Pt does check himself breakfast, lunch and dinner.  Past Medical History  Diagnosis Date  . Hypertension   . Diabetes mellitus without  complication (HCC)   . Arthritis   . History of blood transfusion   . GI bleed   . ESRD (end stage renal disease) (HCC)   . Seizure (HCC)   . Dialysis patient (HCC)   . Subdural hematoma (HCC) 05/2015    from fall  . Sleep apnea     wears CPAP n/c  . Pulmonary hypertension (HCC)     severe pulmonary HTN by 01/14/13 echo    Past Surgical History  Procedure Laterality Date  . Esophagogastroduodenoscopy  10/03/2012    Procedure: ESOPHAGOGASTRODUODENOSCOPY (EGD);  Surgeon: Theda Belfast, MD;  Location: [facility] ENDOSCOPY;  Service: Endoscopy;  Laterality: N/A;  . Colonoscopy  10/04/2012    Procedure: COLONOSCOPY;  Surgeon: Theda Belfast, MD;  Location: [facility] ENDOSCOPY;  Service: Endoscopy;  Laterality: N/A;  . Colonoscopy with esophagogastroduodenoscopy (egd) Left 11/30/2012    Procedure: COLONOSCOPY WITH ESOPHAGOGASTRODUODENOSCOPY (EGD);  Surgeon: Theda Belfast, MD;  Location: [facility] ENDOSCOPY;  Service: Endoscopy;  Laterality: Left;  . Spine surgery  2004    back fusion/ [facility] Penumalli,MD  . Retinal laser procedure Right 04/2015  . Av fistula placement Left 09/09/2015    Procedure: INSERTION OF ARTERIOVENOUS  GORE-TEX GRAFT Left  ARM;  Surgeon: Sherren Kerns, MD;  Location: [facility] OR;  Service: Vascular;  Laterality: Left;  . Insertion of dialysis catheter Right  11/27/2015    Procedure: INSERTION OF DIALYSIS CATHETER - Right Internal Jugular Placement;  Surgeon: Chuck Hint, MD;  Location: [facility] OR;  Service: Vascular;  Laterality: Right;  . Ep implantable device N/A 12/12/2015    Procedure: Pacemaker Implant;  Surgeon: Will Jorja Loa, MD;  Location: [facility] INVASIVE CV LAB;  Service: Cardiovascular;  Laterality: N/A;  . Peripheral vascular catheterization Left 01/27/2016    Procedure: Fistulagram;  Surgeon: Nada Libman, MD;  Location: [facility] INVASIVE CV LAB;  Service: Cardiovascular;  Laterality: Left;  upper arm  . Ligation arteriovenous gortex graft Left 01/27/2016    Procedure: LEFT LIGATION  ARTERIOVENOUS GORTEX GRAFT;  Surgeon: Chuck Hint, MD;  Location: [facility] OR;  Service: Vascular;  Laterality: Left;    Allergies  Allergen Reactions  . Aspirin Other (See Comments)    Bleeding   . Lactose Intolerance (Gi) Diarrhea      Medication List       This list is accurate as of: 02/26/16  9:11 AM.  Always use your most recent med list.               amLODipine 5 MG tablet  Commonly known as:  NORVASC  Take 1 tablet (5 mg total) by mouth at bedtime.     calcitRIOL 0.5 MCG capsule  Commonly known as:  ROCALTROL  Take 2 capsules (1 mcg total) by mouth every Monday, Wednesday, and Friday with hemodialysis.     calcium acetate 667 MG capsule  Commonly known as:  PHOSLO  Take 3 capsules by mouth three times a day     colchicine 0.6 MG tablet  Take 1 tablet (0.6 mg total) by mouth as needed. For gout. Patient needs an appointment before anymore refills.     divalproex 250 MG 24 hr tablet  Commonly known as:  DEPAKOTE ER  Take 1 tablet (250 mg total) by mouth every 12 (twelve) hours.     fexofenadine 180 MG tablet  Commonly known as:  ALLEGRA  Take 180 mg by mouth daily as needed for allergies or rhinitis.     folic acid 1 MG tablet  Commonly known as:  FOLVITE  Take 1 tablet (1 mg total) by mouth daily.     gabapentin 100 MG capsule  Commonly known as:  NEURONTIN  Take 1 capsule (100 mg total) by mouth 3 (three) times daily as needed (pain).     glucose blood test strip  Commonly known as:  ACCU-CHEK AVIVA PLUS  1 each by Other route 3 (three) times daily. Use as instructed     HUMALOG KWIKPEN 100 UNIT/ML KiwkPen  Generic drug:  insulin lispro  Inject 5 Units into the skin as directed.     lacosamide 50 MG Tabs tablet  Commonly known as:  VIMPAT   BID on M-W-F (dialysis days);  BID on Tue-Thu-Sat-Sun (non-dialysis days)     multivitamin Tabs tablet  Take 1 tablet by mouth at bedtime.     oxyCODONE-acetaminophen 5-325 MG tablet    Commonly known as:  ROXICET  Take 1-2 tablets by mouth every 4 (four) hours as needed.     pantoprazole 40 MG tablet  Commonly known as:  PROTONIX  Take 1 tablet (40 mg total) by mouth 2 (two) times daily.     sennosides-docusate sodium 8.6-50 MG tablet  Commonly known as:  SENOKOT-S  Take 1 tablet by mouth every evening.        Review of Systems:  Review of  Systems  Constitutional: Positive for malaise/fatigue. Negative for fever and chills.  HENT: Negative for congestion.   Eyes: Negative for blurred vision.  Respiratory: Negative for cough and shortness of breath.   Cardiovascular: Negative for chest pain and leg swelling.       Edema improved with regular HD  Gastrointestinal: Negative for abdominal pain, constipation, blood in stool and melena.  Genitourinary: Negative for dysuria.  Musculoskeletal: Positive for back pain and falls.  Skin: Negative for rash.  Neurological: Positive for weakness. Negative for dizziness, seizures, loss of consciousness and headaches.  Psychiatric/Behavioral: Positive for memory loss. Negative for depression.    Health Maintenance  Topic Date Due  . OPHTHALMOLOGY EXAM  01/24/1942  . URINE MICROALBUMIN  01/24/1942  . TETANUS/TDAP  01/23/1953  . ZOSTAVAX  01/23/1994  . FOOT EXAM  02/13/2016  . INFLUENZA VACCINE  04/06/2016  . HEMOGLOBIN A1C  04/21/2016  . PNA vac Low Risk Adult (2 of 2 - PPSV23) 06/08/2016    Physical Exam: Filed Vitals:   02/26/16 0844  BP: 130/70  Pulse: 74  Temp: 98.6 F (37 C)  TempSrc: Oral  Weight: 198 lb (89.812 kg)  SpO2: 96%   Body mass index is 26.85 kg/(m^2). Physical Exam  Constitutional: He is oriented to person, place, and time. He appears well-developed and well-nourished. No distress.  Cardiovascular: Normal rate, regular rhythm, normal heart sounds and intact distal pulses.   Right upper arm av fistula is not working well; left arm swelling resolved; has right HD catheter in chest now  without erythema, warmth, drainage  Pulmonary/Chest: Effort normal and breath sounds normal. No respiratory distress.  Abdominal: Soft. Bowel sounds are normal.  Musculoskeletal: Normal range of motion.  Came in wheelchair today  Neurological: He is alert and oriented to person, place, and time.  Skin: Skin is warm and dry.  Psychiatric: He has a normal mood and affect.    Labs reviewed: Basic Metabolic Panel:  Recent Labs  24/58/09 2105  06/20/15 2318 06/21/15 1420  07/26/15 1210  09/16/15 0430  10/27/15 1347  11/26/15 1825  11/28/15 1329  12/13/15 0543 12/15/15 0759 12/22/15 1547 01/27/16 0944  NA  --   < > 135  --   < > 134*  < > 136  < > 138  < >  --   < > 133*  < > 139 134* 137 140  K  --   < > 4.4  --   < > 4.8  < > 6.0*  < > 3.9  < >  --   < > 4.4  < > 4.0 4.6 5.2* 4.9  CL  --   < > 96*  --   < > 98*  < > 98*  < > 96*  < >  --   < > 97*  < > 100* 99* 100* 104  CO2  --   < > 27  --   < > 24  < > 20*  < > 26  --   --   < > 25  < > 23 24 24   --   GLUCOSE  --   < > 147*  --   < > 149*  < > 135*  < > 197*  < >  --   < > 248*  < > 155* 112* 231* 125*  BUN  --   < > 38*  --   < > 75*  < > 140*  < >  62*  < >  --   < > 44*  < > 38* 58* 77* 66*  CREATININE  --   < > 5.89*  --   < > 8.37*  < > 13.98*  < > 9.19*  < >  --   < > 8.12*  < > 6.82* 9.22* 8.85* 8.00*  CALCIUM  --   < > 9.0  --   < > 9.0  < > 7.8*  < > 8.8*  --   --   < > 8.4*  < > 8.8* 9.0 9.1  --   MG 2.2  --  2.5*  --   --  3.3*  --   --   --   --   --   --   --   --   --   --   --   --   --   PHOS  --   < >  --   --   < >  --   < >  --   < > 5.3*  --   --   --  4.0  --   --  3.7  --   --   TSH  --   --   --  1.607  --   --   --  1.621  --   --   --  1.427  --   --   --   --   --   --   --   < > = values in this interval not displayed. Liver Function Tests:  Recent Labs  10/23/15 1108  12/13/15 0543 12/15/15 0759 12/22/15 1547  AST 23  --  17  --  16  ALT 13*  --  8*  --  <5*  ALKPHOS 46  --  48  --  57   BILITOT 0.8  --  0.7  --  0.6  PROT 7.4  --  6.4*  --  6.4*  ALBUMIN 4.1  < > 3.3* 3.1* 3.4*  < > = values in this interval not displayed. No results for input(s): LIPASE, AMYLASE in the last 8760 hours.  Recent Labs  06/21/15 1420  AMMONIA 27   CBC:  Recent Labs  11/26/15 1412  12/12/15 0904  12/13/15 0543 12/15/15 0759 12/22/15 1547 01/27/16 0944  WBC 4.6  < > 4.9  --  2.4* 2.5* 3.5*  --   NEUTROABS 2.9  --  3.5  --   --   --  2.0  --   HGB 9.9*  < > 10.7*  < > 10.4* 9.6* 8.6* 13.3  HCT 31.5*  < > 32.7*  < > 32.0* 28.2* 26.8* 39.0  MCV 100.3*  < > 97.9  --  96.1 94.6 96.4  --   PLT 92*  < > 95*  --  78* 78* 77*  --   < > = values in this interval not displayed. Lipid Panel:  Recent Labs  05/13/15 0925 05/20/15 0313  CHOL 107 111  HDL 66 53  LDLCALC 31 45  TRIG 48 63  CHOLHDL 1.6 2.1   Lab Results  Component Value Date   HGBA1C 5.3 10/23/2015    Procedures since last visit: Pacemaker implant on 12/12/15 Dr. Elberta Fortis for symptomatic bradycardia  ED visit for left arm swelling 12/22/15 with UE venous and duplex dialysis access done:  Left upper extremity venous duplex has been completed. Preliminary findings: No evidence of DVT or  superficial thrombosis. Unchanged from 12/13/15, also negative study. Duplex Dialysis Access has been completed. Left arm HD graft appears to be patent with no evidence of perigraft fluid, no evidence of thrombus, and no evidence of pseudoaneurysm.  01/16/16 Saw Dr. Edilia Bo recommended a fistulogram to look for central venous stenosis  01/20/16 wound check for pacemaker was good as was function--sees Dr. Elberta Fortis next 03/16/16  01/27/16 fistulogram:  Central venous occlusion. There is a stent within the left upper arm graft which is widely patent. The arterial venous anastomosis is widely patent  01/27/16 left ligation arteriovenous gortex graft by Dr. Elesa Hacker plans noted for a thigh graft evaluation as future access and to have ABIs  done  Assessment/Plan 1. Degenerative disc disease, lumbar - this is ongoing  - pain is worst when he's in the HD chair for prolonged time - continue his gabapentin, salonpas with lidocaine (but adhere with paper tape) and will try again to get him a butrans patch as I'm unclear what happened before - gabapentin (NEURONTIN) 100 MG capsule; Take 1 capsule (100 mg total) by mouth 3 (three) times daily.  Dispense: 90 capsule; Refill: 5  2. Midline low back pain without sciatica - buprenorphine (BUTRANS) 5 MCG/HR PTWK patch; Place 1 patch (5 mcg total) onto the skin once a week.  Dispense: 4 patch; Refill: 5  3. Generalized weakness - remains weak from his loads of hospitalizations - needs some PT for strengthening and gait stability and OT for home safety/functionality - Ambulatory referral to Home Health  4. Controlled type 2 diabetes mellitus with diabetic nephropathy, without long-term current use of insulin (HCC) - Lab Results  Component Value Date   HGBA1C 5.3 10/23/2015  continues on his same insulin regimen His DIL will call back if he needs more of his meal coverage insulin No problems known with lows but has also been under better control since eating home cooked meals by his DIL, she reports  5. Chronic diastolic congestive heart failure (HCC) -has been better lately with less sodium in diet and regular HD w/o missed sessions, plus using functioning HD catheter  6. Left arm swelling -resolved with ligation of AV graft in that arm  7. ESRD on dialysis [facility]) -continues M/W/F sessions  8. AV junctional bradycardia - may have been contributing to his falls that resulted in SDHs, but is now s/p pacemaker  9. S/P placement of cardiac pacemaker - continue to follow with cardiology  10. Seizure disorder (HCC) -cont vimpat as directed -fortunately, his DIL is now filling pillboxes so he should be taking this properly as directed to prevent recurrent seizures from his prior  subdurals  11. SDH (subdural hematoma) x 2 -has some dementia as a result and needs med mgt and someone to accompany him to his appts -will reassess mmse at his annual wellness  Labs/tests ordered:   Orders Placed This Encounter  Procedures  . Ambulatory referral to Home Health    Referral Priority:  Routine    Referral Type:  Home Health Care    Referral Reason:  Specialty Services Required    Requested Specialty:  Home Health Services    Number of Visits Requested:  1   Next appt:  06/03/2016 for med mgt  Mor ethan 40 mins was spent reviewing his hospitalizations, surgeries, procedures, and labs since his last visit, providing education and coordinating care plus history and exam.    Anamari Galeas L. Megan Presti, D.O. Geriatrics [facility] [address], [place] [address] Cell Phone (Mon-Fri 8am-5pm):  [phone] On Call:  [phone] & follow prompts after 5pm & weekends Office Phone:  [phone] Office Fax:  [phone]

## 2016-02-26 NOTE — Patient Instructions (Addendum)
Check with kidney doctor about sensipar.  Check if you need more humalog pens and call us if you do.

## 2016-02-27 DIAGNOSIS — D631 Anemia in chronic kidney disease: Secondary | ICD-10-CM | POA: Diagnosis not present

## 2016-02-27 DIAGNOSIS — D509 Iron deficiency anemia, unspecified: Secondary | ICD-10-CM | POA: Diagnosis not present

## 2016-02-27 DIAGNOSIS — Z950 Presence of cardiac pacemaker: Secondary | ICD-10-CM | POA: Insufficient documentation

## 2016-02-27 DIAGNOSIS — S065XAA Traumatic subdural hemorrhage with loss of consciousness status unknown, initial encounter: Secondary | ICD-10-CM | POA: Insufficient documentation

## 2016-02-27 DIAGNOSIS — N186 End stage renal disease: Secondary | ICD-10-CM | POA: Diagnosis not present

## 2016-02-27 DIAGNOSIS — D689 Coagulation defect, unspecified: Secondary | ICD-10-CM | POA: Diagnosis not present

## 2016-02-27 DIAGNOSIS — S065X9A Traumatic subdural hemorrhage with loss of consciousness of unspecified duration, initial encounter: Secondary | ICD-10-CM | POA: Insufficient documentation

## 2016-02-27 DIAGNOSIS — Z452 Encounter for adjustment and management of vascular access device: Secondary | ICD-10-CM | POA: Diagnosis not present

## 2016-03-01 ENCOUNTER — Encounter: Payer: Self-pay | Admitting: Vascular Surgery

## 2016-03-01 ENCOUNTER — Other Ambulatory Visit: Payer: Self-pay | Admitting: *Deleted

## 2016-03-01 DIAGNOSIS — D509 Iron deficiency anemia, unspecified: Secondary | ICD-10-CM | POA: Diagnosis not present

## 2016-03-01 DIAGNOSIS — D689 Coagulation defect, unspecified: Secondary | ICD-10-CM | POA: Diagnosis not present

## 2016-03-01 DIAGNOSIS — Z452 Encounter for adjustment and management of vascular access device: Secondary | ICD-10-CM | POA: Diagnosis not present

## 2016-03-01 DIAGNOSIS — N186 End stage renal disease: Secondary | ICD-10-CM | POA: Diagnosis not present

## 2016-03-01 DIAGNOSIS — D631 Anemia in chronic kidney disease: Secondary | ICD-10-CM | POA: Diagnosis not present

## 2016-03-01 MED ORDER — INSULIN PEN NEEDLE 31G X 8 MM MISC: Status: AC

## 2016-03-01 NOTE — Telephone Encounter (Signed)
[facility] [address] 

## 2016-03-03 DIAGNOSIS — D689 Coagulation defect, unspecified: Secondary | ICD-10-CM | POA: Diagnosis not present

## 2016-03-03 DIAGNOSIS — N186 End stage renal disease: Secondary | ICD-10-CM | POA: Diagnosis not present

## 2016-03-03 DIAGNOSIS — D631 Anemia in chronic kidney disease: Secondary | ICD-10-CM | POA: Diagnosis not present

## 2016-03-03 DIAGNOSIS — D509 Iron deficiency anemia, unspecified: Secondary | ICD-10-CM | POA: Diagnosis not present

## 2016-03-03 DIAGNOSIS — Z452 Encounter for adjustment and management of vascular access device: Secondary | ICD-10-CM | POA: Diagnosis not present

## 2016-03-04 ENCOUNTER — Observation Stay (HOSPITAL_COMMUNITY)
Admission: EM | Admit: 2016-03-04 | Discharge: 2016-03-07 | Disposition: A | Discharge status: Home Health | Payer: Medicare Other | Attending: Internal Medicine | Admitting: Internal Medicine

## 2016-03-04 ENCOUNTER — Observation Stay (HOSPITAL_COMMUNITY): Payer: Medicare Other

## 2016-03-04 ENCOUNTER — Emergency Department (HOSPITAL_COMMUNITY): Payer: Medicare Other

## 2016-03-04 ENCOUNTER — Encounter (HOSPITAL_COMMUNITY): Payer: Self-pay | Admitting: Emergency Medicine

## 2016-03-04 DIAGNOSIS — G40909 Epilepsy, unspecified, not intractable, without status epilepticus: Secondary | ICD-10-CM | POA: Diagnosis not present

## 2016-03-04 DIAGNOSIS — R410 Disorientation, unspecified: Secondary | ICD-10-CM | POA: Diagnosis not present

## 2016-03-04 DIAGNOSIS — I120 Hypertensive chronic kidney disease with stage 5 chronic kidney disease or end stage renal disease: Secondary | ICD-10-CM | POA: Diagnosis not present

## 2016-03-04 DIAGNOSIS — Z992 Dependence on renal dialysis: Secondary | ICD-10-CM | POA: Diagnosis not present

## 2016-03-04 DIAGNOSIS — Z794 Long term (current) use of insulin: Secondary | ICD-10-CM | POA: Diagnosis not present

## 2016-03-04 DIAGNOSIS — Z950 Presence of cardiac pacemaker: Secondary | ICD-10-CM | POA: Diagnosis not present

## 2016-03-04 DIAGNOSIS — D696 Thrombocytopenia, unspecified: Secondary | ICD-10-CM | POA: Diagnosis not present

## 2016-03-04 DIAGNOSIS — Z66 Do not resuscitate: Secondary | ICD-10-CM | POA: Diagnosis not present

## 2016-03-04 DIAGNOSIS — R6510 Systemic inflammatory response syndrome (SIRS) of non-infectious origin without acute organ dysfunction: Secondary | ICD-10-CM

## 2016-03-04 DIAGNOSIS — G9340 Encephalopathy, unspecified: Principal | ICD-10-CM | POA: Insufficient documentation

## 2016-03-04 DIAGNOSIS — R509 Fever, unspecified: Secondary | ICD-10-CM | POA: Diagnosis not present

## 2016-03-04 DIAGNOSIS — E1122 Type 2 diabetes mellitus with diabetic chronic kidney disease: Secondary | ICD-10-CM | POA: Insufficient documentation

## 2016-03-04 DIAGNOSIS — F0280 Dementia in other diseases classified elsewhere without behavioral disturbance: Secondary | ICD-10-CM | POA: Insufficient documentation

## 2016-03-04 DIAGNOSIS — M1990 Unspecified osteoarthritis, unspecified site: Secondary | ICD-10-CM | POA: Diagnosis not present

## 2016-03-04 DIAGNOSIS — R4182 Altered mental status, unspecified: Secondary | ICD-10-CM | POA: Diagnosis not present

## 2016-03-04 DIAGNOSIS — N186 End stage renal disease: Secondary | ICD-10-CM | POA: Diagnosis not present

## 2016-03-04 HISTORY — DX: Dependence on renal dialysis: Z99.2

## 2016-03-04 HISTORY — DX: End stage renal disease: N18.6

## 2016-03-04 LAB — CBC WITH DIFFERENTIAL/PLATELET
BASOS PCT: 0 %
Basophils Absolute: 0 10*3/uL (ref 0.0–0.1)
EOS ABS: 0 10*3/uL (ref 0.0–0.7)
Eosinophils Relative: 1 %
HCT: 39.2 % (ref 39.0–52.0)
Hemoglobin: 12.9 g/dL — ABNORMAL LOW (ref 13.0–17.0)
Lymphocytes Relative: 30 %
Lymphs Abs: 0.8 10*3/uL (ref 0.7–4.0)
MCH: 32.4 pg (ref 26.0–34.0)
MCHC: 32.9 g/dL (ref 30.0–36.0)
MCV: 98.5 fL (ref 78.0–100.0)
MONO ABS: 0.2 10*3/uL (ref 0.1–1.0)
MONOS PCT: 8 %
NEUTROS PCT: 61 %
Neutro Abs: 1.6 10*3/uL — ABNORMAL LOW (ref 1.7–7.7)
Platelets: 57 10*3/uL — ABNORMAL LOW (ref 150–400)
RBC: 3.98 MIL/uL — ABNORMAL LOW (ref 4.22–5.81)
RDW: 16.2 % — AB (ref 11.5–15.5)
WBC: 2.5 10*3/uL — ABNORMAL LOW (ref 4.0–10.5)

## 2016-03-04 LAB — URINE MICROSCOPIC-ADD ON

## 2016-03-04 LAB — DIC (DISSEMINATED INTRAVASCULAR COAGULATION) PANEL
D DIMER QUANT: 7.93 ug{FEU}/mL — AB (ref 0.00–0.50)
FIBRINOGEN: 236 mg/dL (ref 204–475)
SMEAR REVIEW: NONE SEEN

## 2016-03-04 LAB — COMPREHENSIVE METABOLIC PANEL
ALK PHOS: 72 U/L (ref 38–126)
ALT: 25 U/L (ref 17–63)
AST: 37 U/L (ref 15–41)
Albumin: 3.5 g/dL (ref 3.5–5.0)
Anion gap: 10 (ref 5–15)
BILIRUBIN TOTAL: 0.5 mg/dL (ref 0.3–1.2)
BUN: 35 mg/dL — ABNORMAL HIGH (ref 6–20)
CALCIUM: 9.4 mg/dL (ref 8.9–10.3)
CO2: 26 mmol/L (ref 22–32)
CREATININE: 6.01 mg/dL — AB (ref 0.61–1.24)
Chloride: 100 mmol/L — ABNORMAL LOW (ref 101–111)
GFR calc non Af Amer: 8 mL/min — ABNORMAL LOW (ref >60)
GFR, EST AFRICAN AMERICAN: 9 mL/min — AB (ref >60)
GLUCOSE: 217 mg/dL — AB (ref 65–99)
Potassium: 3.9 mmol/L (ref 3.5–5.1)
SODIUM: 136 mmol/L (ref 135–145)
Total Protein: 7 g/dL (ref 6.5–8.1)

## 2016-03-04 LAB — URINALYSIS, ROUTINE W REFLEX MICROSCOPIC
BILIRUBIN URINE: NEGATIVE
Glucose, UA: 250 mg/dL — AB
KETONES UR: NEGATIVE mg/dL
Leukocytes, UA: NEGATIVE
NITRITE: NEGATIVE
Protein, ur: 100 mg/dL — AB
Specific Gravity, Urine: 1.013 (ref 1.005–1.030)
pH: 7.5 (ref 5.0–8.0)

## 2016-03-04 LAB — I-STAT CG4 LACTIC ACID, ED
Lactic Acid, Venous: 1.73 mmol/L (ref 0.5–1.9)
Lactic Acid, Venous: 1.22 mmol/L (ref 0.5–1.9)

## 2016-03-04 LAB — MRSA PCR SCREENING: MRSA BY PCR: NEGATIVE

## 2016-03-04 LAB — GLUCOSE, CAPILLARY
Glucose-Capillary: 161 mg/dL — ABNORMAL HIGH (ref 65–99)
Glucose-Capillary: 190 mg/dL — ABNORMAL HIGH (ref 65–99)
Glucose-Capillary: 224 mg/dL — ABNORMAL HIGH (ref 65–99)
Glucose-Capillary: 200 mg/dL — ABNORMAL HIGH (ref 65–99)

## 2016-03-04 LAB — DIC (DISSEMINATED INTRAVASCULAR COAGULATION)PANEL
INR: 1.21 (ref 0.00–1.49)
Platelets: 53 10*3/uL — ABNORMAL LOW (ref 150–400)
Prothrombin Time: 15.5 seconds — ABNORMAL HIGH (ref 11.6–15.2)
aPTT: 36 seconds (ref 24–37)

## 2016-03-04 LAB — CBG MONITORING, ED: Glucose-Capillary: 230 mg/dL — ABNORMAL HIGH (ref 65–99)

## 2016-03-04 MED ORDER — VANCOMYCIN HCL 10 G IV SOLR
1500.0000 mg | Freq: Once | INTRAVENOUS | Status: AC
  Administered 2016-03-04: 1500 mg via INTRAVENOUS
  Filled 2016-03-04: qty 1500

## 2016-03-04 MED ORDER — VANCOMYCIN HCL IN DEXTROSE 1-5 GM/200ML-% IV SOLN
1000.0000 mg | INTRAVENOUS | Status: DC
  Filled 2016-03-04: qty 200

## 2016-03-04 MED ORDER — DEXTROSE 5 % IV SOLN
2.0000 g | Freq: Once | INTRAVENOUS | Status: AC
  Administered 2016-03-04: 2 g via INTRAVENOUS
  Filled 2016-03-04: qty 2

## 2016-03-04 MED ORDER — DEXTROSE 5 % IV SOLN
1.0000 g | Freq: Every day | INTRAVENOUS | Status: DC
  Filled 2016-03-04: qty 1

## 2016-03-04 MED ORDER — GABAPENTIN 100 MG PO CAPS
100.0000 mg | ORAL_CAPSULE | Freq: Three times a day (TID) | ORAL | Status: DC
  Administered 2016-03-04 – 2016-03-07 (×10): 100 mg via ORAL
  Filled 2016-03-04 (×10): qty 1

## 2016-03-04 MED ORDER — SODIUM CHLORIDE 0.9 % IV BOLUS (SEPSIS)
1000.0000 mL | Freq: Once | INTRAVENOUS | Status: AC
  Administered 2016-03-04: 1000 mL via INTRAVENOUS

## 2016-03-04 MED ORDER — FOLIC ACID 1 MG PO TABS
1.0000 mg | ORAL_TABLET | Freq: Every day | ORAL | Status: DC
  Administered 2016-03-04 – 2016-03-07 (×4): 1 mg via ORAL
  Filled 2016-03-04 (×4): qty 1

## 2016-03-04 MED ORDER — PANTOPRAZOLE SODIUM 40 MG PO TBEC
40.0000 mg | DELAYED_RELEASE_TABLET | Freq: Two times a day (BID) | ORAL | Status: DC
  Administered 2016-03-04 – 2016-03-07 (×7): 40 mg via ORAL
  Filled 2016-03-04 (×7): qty 1

## 2016-03-04 MED ORDER — LACOSAMIDE 50 MG PO TABS
100.0000 mg | ORAL_TABLET | ORAL | Status: DC
  Administered 2016-03-04 – 2016-03-07 (×5): 100 mg via ORAL
  Filled 2016-03-04 (×4): qty 2

## 2016-03-04 MED ORDER — AMLODIPINE BESYLATE 5 MG PO TABS
5.0000 mg | ORAL_TABLET | Freq: Every day | ORAL | Status: DC
  Administered 2016-03-04 – 2016-03-06 (×3): 5 mg via ORAL
  Filled 2016-03-04 (×3): qty 1

## 2016-03-04 MED ORDER — ACETAMINOPHEN 500 MG PO TABS
1000.0000 mg | ORAL_TABLET | Freq: Once | ORAL | Status: AC
  Administered 2016-03-04: 1000 mg via ORAL
  Filled 2016-03-04: qty 2

## 2016-03-04 MED ORDER — RENA-VITE PO TABS
1.0000 | ORAL_TABLET | Freq: Every day | ORAL | Status: DC
  Administered 2016-03-04 – 2016-03-06 (×3): 1 via ORAL
  Filled 2016-03-04 (×3): qty 1

## 2016-03-04 MED ORDER — ACETAMINOPHEN 325 MG PO TABS: 650.0000 mg | ORAL_TABLET | Freq: Four times a day (QID) | ORAL | Status: DC | PRN

## 2016-03-04 MED ORDER — LORATADINE 10 MG PO TABS
10.0000 mg | ORAL_TABLET | Freq: Every day | ORAL | Status: DC
Start: 2016-03-04 — End: 2016-03-07
  Administered 2016-03-04 – 2016-03-07 (×4): 10 mg via ORAL
  Filled 2016-03-04 (×4): qty 1

## 2016-03-04 MED ORDER — LACOSAMIDE 50 MG PO TABS
150.0000 mg | ORAL_TABLET | ORAL | Status: DC
  Administered 2016-03-05 (×2): 150 mg via ORAL
  Filled 2016-03-04 (×3): qty 3

## 2016-03-04 MED ORDER — SENNOSIDES-DOCUSATE SODIUM 8.6-50 MG PO TABS
1.0000 | ORAL_TABLET | Freq: Every evening | ORAL | Status: DC
  Administered 2016-03-04 – 2016-03-06 (×3): 1 via ORAL
  Filled 2016-03-04 (×4): qty 1

## 2016-03-04 MED ORDER — BUPRENORPHINE 5 MCG/HR TD PTWK: 5.0000 ug | MEDICATED_PATCH | TRANSDERMAL | Status: DC

## 2016-03-04 MED ORDER — INSULIN ASPART 100 UNIT/ML ~~LOC~~ SOLN
0.0000 [IU] | Freq: Three times a day (TID) | SUBCUTANEOUS | Status: DC
  Administered 2016-03-04: 3 [IU] via SUBCUTANEOUS
  Administered 2016-03-04 – 2016-03-07 (×7): 2 [IU] via SUBCUTANEOUS

## 2016-03-04 MED ORDER — CALCIUM ACETATE (PHOS BINDER) 667 MG PO CAPS
2001.0000 mg | ORAL_CAPSULE | Freq: Three times a day (TID) | ORAL | Status: DC
  Administered 2016-03-04 – 2016-03-07 (×9): 2001 mg via ORAL
  Filled 2016-03-04 (×8): qty 3

## 2016-03-04 MED ORDER — CALCITRIOL 0.5 MCG PO CAPS
1.0000 ug | ORAL_CAPSULE | ORAL | Status: DC
  Administered 2016-03-05 (×2): 1 ug via ORAL
  Filled 2016-03-04 (×2): qty 2

## 2016-03-04 MED ORDER — DIVALPROEX SODIUM ER 250 MG PO TB24
250.0000 mg | ORAL_TABLET | Freq: Two times a day (BID) | ORAL | Status: DC
  Administered 2016-03-04 – 2016-03-07 (×7): 250 mg via ORAL
  Filled 2016-03-04 (×8): qty 1

## 2016-03-04 NOTE — Progress Notes (Signed)
Pharmacy Antibiotic Note  [name] is a 82 y.o. male admitted on 03/04/2016 with sepsis.  Pharmacy has been consulted for Vancomycin and Cefepime dosing. Pt with ESRD - M/W/F HD as o/p.  Pt received Cefepime 2gm and Vanc 1500mg  given in ED ~0230  Plan: Cefepime 1gm IV q24h Vancomycin 1gm IV with HD Will f/u micro data, HD schedule and tolerance, and pt's clinical condition Vanc trough prn   Weight: 198 lb (89.812 kg)  Temp (24hrs), Avg:99.9 F (37.7 C), Min:98.8 F (37.1 C), Max:101 F (38.3 C)   Recent Labs Lab 03/04/16 0149 03/04/16 0202 03/04/16 0417  WBC  --  2.5*  --   CREATININE  --  6.01*  --   LATICACIDVEN 1.73  --  1.22    Estimated Creatinine Clearance: 10.4 mL/min (by C-G formula based on Cr of 6.01).    Allergies  Allergen Reactions  . Aspirin Other (See Comments)    Bleeding   . Lactose Intolerance (Gi) Diarrhea    Antimicrobials this admission: 6/29 Vanc >>  6/29 Cefepime >>   Dose adjustments this admission: n/a  Microbiology results: 6/29 BCx x2:  6/29 UCx:    Thank you for allowing pharmacy to be a part of this patient's care.  7/29, PharmD, BCPS Clinical pharmacist, pager [phone] 03/04/2016 4:56 AM

## 2016-03-04 NOTE — ED Notes (Signed)
Pt presents from home with [facility] for AMS and fever; wife reports when she layed down for bed with patient, patient was not responding normally/accurately; pt oriented to self only; pt denies pain; HD on MWF (received full treatment Wednesday);

## 2016-03-04 NOTE — Progress Notes (Signed)
   Brief [facility] Note (Full Note to Follow if pt off observation status)Pt admitted in Observ. Statis  With Acute encephalopathy, Delirium  Thought  secondary to fever /and per admit team this am = "resolved,and  appears at baseline this morning."" HO Dementia / ESRD MWF ar [facility] has been compliant with TX attendance past week .  OP HD MWF  [facility]  EDW 89.5 kg  2k/2.25Ca bath   No Hep.    Mircera 150 mcg q 2 weeks (last on 03/03/16) Venofer 50 q weekly hd PO Calcitriol 0.86mcg q HD   Lenny Pastel, PA-C [facility] [phone] 03/04/2016, 3:50 pm  Pt seen, examined and agree w A/P as above.  [name] [employer] pager [phone]    cell [phone] 03/05/2016, 9:17 AM

## 2016-03-04 NOTE — Progress Notes (Signed)
Patient seen and examined this morning, admitted earlier today for one febrile episode associated with confusion, now resolved.   Acute encephalopathy, Delirium - secondary to fever / SIRS - resolved, appears at baseline this morning - no clear infectious source identified, patient has no respiratory / urinary symptoms - lactic acid normal  - cultures pending, continue antibiotics for now  Febrile illness - unclear etiology. With elevated D Dimer DVT may be a possibility. Patient has no chest pain, no tachycardia, no hypoxia and doubt PE. Obtain LE [place]  ESRD  - Dialysis MWF - nephrology aware of patient's hospitalization  Thrombocytopenia  - appears to be acute on chronic  DM  - sensitive scale SSI AC/HS  Costin M. Elvera Lennox, MD [facility] [phone]

## 2016-03-04 NOTE — ED Provider Notes (Signed)
CSN: [id number]     Arrival date & time 03/04/16  0114 History  By signing my name below, I, [name], attest that this documentation has been prepared under the direction and in the presence of Andrew Crumble, MD. Electronically Signed: Bethel [name], ED Scribe. 03/04/2016. 1:44 AM   Chief Complaint  Patient presents with  . Altered Mental Status  . Fever   Level V caveat due to AMS   The history is provided by the EMS personnel. The history is limited by the condition of the patient. No language interpreter was used.   Brought in by EMS from home, [name] is a 82 y.o. male who presents to the Emergency Department for evaluation of AMS and fever. Per EMS, the patient's wife reported that when they went to bed the pt was not behaving or responding normally.   Past Medical History  Diagnosis Date  . Hypertension   . Diabetes mellitus without complication (HCC)   . Arthritis   . History of blood transfusion   . GI bleed   . ESRD (end stage renal disease) (HCC)   . Seizure (HCC)   . Dialysis patient (HCC)   . Subdural hematoma (HCC) 05/2015    from fall  . Sleep apnea     wears CPAP n/c  . Pulmonary hypertension (HCC)     severe pulmonary HTN by 01/14/13 echo   Past Surgical History  Procedure Laterality Date  . Esophagogastroduodenoscopy  10/03/2012    Procedure: ESOPHAGOGASTRODUODENOSCOPY (EGD);  Surgeon: Theda Belfast, MD;  Location: [facility] ENDOSCOPY;  Service: Endoscopy;  Laterality: N/A;  . Colonoscopy  10/04/2012    Procedure: COLONOSCOPY;  Surgeon: Theda Belfast, MD;  Location: [facility] ENDOSCOPY;  Service: Endoscopy;  Laterality: N/A;  . Colonoscopy with esophagogastroduodenoscopy (egd) Left 11/30/2012    Procedure: COLONOSCOPY WITH ESOPHAGOGASTRODUODENOSCOPY (EGD);  Surgeon: Theda Belfast, MD;  Location: [facility] ENDOSCOPY;  Service: Endoscopy;  Laterality: Left;  . Spine surgery  2004    back fusion/ MHC Penumalli,MD  . Retinal laser procedure Right 04/2015  . Av fistula  placement Left 09/09/2015    Procedure: INSERTION OF ARTERIOVENOUS  GORE-TEX GRAFT Left  ARM;  Surgeon: Sherren Kerns, MD;  Location: [facility] OR;  Service: Vascular;  Laterality: Left;  . Insertion of dialysis catheter Right 11/27/2015    Procedure: INSERTION OF DIALYSIS CATHETER - Right Internal Jugular Placement;  Surgeon: Chuck Hint, MD;  Location: [facility] OR;  Service: Vascular;  Laterality: Right;  . Ep implantable device N/A 12/12/2015    Procedure: Pacemaker Implant;  Surgeon: Will Jorja Loa, MD;  Location: [facility] INVASIVE CV LAB;  Service: Cardiovascular;  Laterality: N/A;  . Peripheral vascular catheterization Left 01/27/2016    Procedure: Fistulagram;  Surgeon: Nada Libman, MD;  Location: [facility] INVASIVE CV LAB;  Service: Cardiovascular;  Laterality: Left;  upper arm  . Ligation arteriovenous gortex graft Left 01/27/2016    Procedure: LEFT LIGATION ARTERIOVENOUS GORTEX GRAFT;  Surgeon: Chuck Hint, MD;  Location: [facility] OR;  Service: Vascular;  Laterality: Left;   Family History  Problem Relation Age of Onset  . Sudden death Mother   . Sudden death Father    Social History  Substance Use Topics  . Smoking status: Never Smoker   . Smokeless tobacco: Never Used  . Alcohol Use: No    Review of Systems  Unable to perform ROS: Mental status change    Allergies  Aspirin and Lactose intolerance (gi)  Home Medications  Prior to Admission medications   Medication Sig Start Date End Date Taking? Authorizing Provider  amLODipine (NORVASC) 5 MG tablet Take 1 tablet (5 mg total) by mouth at bedtime. 02/26/16   [name] L [name], DO  buprenorphine (BUTRANS) 5 MCG/HR PTWK patch Place 1 patch (5 mcg total) onto the skin once a week. 02/26/16   [name] L [name], DO  calcitRIOL (ROCALTROL) 0.5 MCG capsule Take 2 capsules (1 mcg total) by mouth every Monday, Wednesday, and Friday with hemodialysis. 02/26/16   [name] L [name], DO  calcium acetate (PHOSLO) 667 MG capsule Take 3 capsules by  mouth three times a day 02/26/16   [name] L [name], DO  colchicine 0.6 MG tablet Take 1 tablet (0.6 mg total) by mouth as needed. For gout. 02/26/16   [name] L [name], DO  divalproex (DEPAKOTE ER) 250 MG 24 hr tablet Take 1 tablet (250 mg total) by mouth every 12 (twelve) hours. 02/26/16   [name] L [name], DO  fexofenadine (ALLEGRA) 180 MG tablet Take 1 tablet (180 mg total) by mouth daily as needed for allergies or rhinitis. 02/26/16   [name] L [name], DO  folic acid (FOLVITE) 1 MG tablet Take 1 tablet (1 mg total) by mouth daily. 02/26/16   [name] L [name], DO  gabapentin (NEURONTIN) 100 MG capsule Take 1 capsule (100 mg total) by mouth 3 (three) times daily. 02/26/16   [name] L [name], DO  glucose blood (ACCU-CHEK AVIVA PLUS) test strip 1 each by Other route 3 (three) times daily. Use as instructed 01/21/16   [name] L [name], DO  HUMALOG KWIKPEN 100 UNIT/ML KiwkPen Inject 5 Units into the skin as directed.  02/19/16   Historical Provider, MD  Insulin Pen Needle 31G X 8 MM MISC Use as directed with insulin pens 03/01/16   [name] L [name], DO  lacosamide (VIMPAT) 50 MG TABS tablet 150mg  BID on M-W-F (dialysis days); 100mg  BID on Tue-Thu-Sat-Sun (non-dialysis days) 02/26/16   [name] L [name], DO  multivitamin (RENA-VIT) TABS tablet Take 1 tablet by mouth at bedtime. 02/26/16   [name] L [name], DO  pantoprazole (PROTONIX) 40 MG tablet Take 1 tablet (40 mg total) by mouth 2 (two) times daily. 02/26/16   [name] L [name], DO  sennosides-docusate sodium (SENOKOT-S) 8.6-50 MG tablet Take 1 tablet by mouth every evening. 02/26/16   [name] L [name], DO   BP 156/67 mmHg  Pulse 84  Temp(Src) 101 F (38.3 C) (Oral)  Resp 19  SpO2 96% Physical Exam  Constitutional: Vital signs are normal. He appears well-developed and well-nourished.  Non-toxic appearance. He does not appear ill. No distress.  HENT:  Head: Normocephalic and atraumatic.  Nose: Nose normal.  Mouth/Throat: Oropharynx is clear and moist. No oropharyngeal exudate.   Eyes: Conjunctivae and EOM are normal. Pupils are equal, round, and reactive to light. No scleral icterus.  Neck: Normal range of motion. Neck supple. No tracheal deviation, no edema, no erythema and normal range of motion present. No thyroid mass and no thyromegaly present.  Cardiovascular: Normal rate, regular rhythm, S1 normal, S2 normal, normal heart sounds, intact distal pulses and normal pulses.  Exam reveals no gallop and no friction rub.   No murmur heard. Pulmonary/Chest: Effort normal and breath sounds normal. No respiratory distress. He has no wheezes. He has no rhonchi. He has no rales.  Right HD catheter   Abdominal: Soft. Normal appearance and bowel sounds are normal. He exhibits no distension, no ascites and no mass. There is no hepatosplenomegaly.  There is no tenderness. There is no rebound, no guarding and no CVA tenderness.  Musculoskeletal: Normal range of motion. He exhibits no edema or tenderness.  Bilateral upper extremity AV grafts with no thrill  Lymphadenopathy:    He has no cervical adenopathy.  Neurological: He is alert. He has normal strength. No cranial nerve deficit or sensory deficit.  Skin: Skin is warm, dry and intact. No petechiae and no rash noted. He is not diaphoretic. No erythema. No pallor.  Tactile fever  Psychiatric: He has a normal mood and affect. His behavior is normal.  Nursing note and vitals reviewed.   ED Course  Procedures (including critical care time) DIAGNOSTIC STUDIES: Oxygen Saturation is 96% on RA,  normal by my interpretation.    COORDINATION OF CARE: 1:36 AM Treatment plan includes lab work, CXR, EKG, Tylenol, vancomycin, cefepime, and IVF .  Labs Review Labs Reviewed  COMPREHENSIVE METABOLIC PANEL - Abnormal; Notable for the following:    Chloride 100 (*)    Glucose, Bld 217 (*)    BUN 35 (*)    Creatinine, Ser 6.01 (*)    GFR calc non Af Amer 8 (*)    GFR calc Af Amer 9 (*)    All other components within normal limits   CBC WITH DIFFERENTIAL/PLATELET - Abnormal; Notable for the following:    WBC 2.5 (*)    RBC 3.98 (*)    Hemoglobin 12.9 (*)    RDW 16.2 (*)    Platelets 57 (*)    Neutro Abs 1.6 (*)    All other components within normal limits  URINALYSIS, ROUTINE W REFLEX MICROSCOPIC (NOT AT [facility]) - Abnormal; Notable for the following:    Glucose, UA 250 (*)    Hgb urine dipstick TRACE (*)    Protein, ur 100 (*)    All other components within normal limits  URINE MICROSCOPIC-ADD ON - Abnormal; Notable for the following:    Squamous Epithelial / LPF 0-5 (*)    Bacteria, UA RARE (*)    All other components within normal limits  CBG MONITORING, ED - Abnormal; Notable for the following:    Glucose-Capillary 230 (*)    All other components within normal limits  CULTURE, BLOOD (ROUTINE X 2)  CULTURE, BLOOD (ROUTINE X 2)  URINE CULTURE  MRSA PCR SCREENING  DIC (DISSEMINATED INTRAVASCULAR COAGULATION) PANEL  HEPARIN INDUCED PLATELET AB (HIT ANTIBODY)  I-STAT CG4 LACTIC ACID, ED  I-STAT CG4 LACTIC ACID, ED    Imaging Review Dg Chest Port 1 View  03/04/2016  CLINICAL DATA:  Altered mental status. EXAM: PORTABLE CHEST 1 VIEW COMPARISON:  12/13/2015 FINDINGS: Intact transvenous cardiac leads. Right jugular dual-lumen central line extending into the low SVC. The lungs are clear. The pulmonary vasculature is normal. No large effusions. IMPRESSION: No active disease. Electronically Signed   By: Ellery Plunk M.D.   On: 03/04/2016 01:45   I have personally reviewed and evaluated these images and lab results as part of my medical decision-making.   EKG Interpretation   Date/Time:  Thursday March 04 2016 01:41:24 EDT Ventricular Rate:  87 PR Interval:    QRS Duration: 82 QT Interval:  358 QTC Calculation: 431 R Axis:   24 Text Interpretation:  Sinus rhythm Borderline T abnormalities, lateral  leads No significant change since last tracing Confirmed by Erroll Luna [phone]) on 03/04/2016  1:52:36 AM      MDM   Final diagnoses:  None    Patient presents  to the ED for AMS and fever.  Code sepsis called and patient given 1L IVF.  Vanc and cefepime ordered as well as tylenol.  I placed an IV as he is a difficult stick.    CXR and UA negative for infection.  Will obtain CT head for any abnormalities.  He is now much more awake and alert after fluid resuscitation.  Dr. Julian Reil will admit for further care.    Angiocath insertion Performed by: [name]  Consent: Verbal consent obtained. Risks and benefits: risks, benefits and alternatives were discussed Time out: Immediately prior to procedure a "time out" was called to verify the correct patient, procedure, equipment, support staff and site/side marked as required.  Preparation: Patient was prepped and draped in the usual sterile fashion.  Vein Location: R brachial vein  Ultrasound Guided  Gauge: 18G  Normal blood return and flush without difficulty Patient tolerance: Patient tolerated the procedure well with no immediate complications.     I personally performed the services described in this documentation, which was scribed in my presence. The recorded information has been reviewed and is accurate.     Andrew Crumble, MD 03/04/16 [phone]

## 2016-03-04 NOTE — Progress Notes (Signed)
Received pt.from ED ,alert oriented x3,no acute distress.was brought to the ED for altered mental status.V/S taken & recorded.Inpt.bracelet verified w/ pt.& applied.made comfortable in bed.Skin assesssment done w/ Arlyss Queen RN.Skin intact .With rt.dialysis cath.on rt.chest.D/I.Oriented the pt.to the room & call bell.Instructed to call the nurse before  getting OOB.Call bell w/in reach.Will continue to monitor pt.

## 2016-03-04 NOTE — H&P (Signed)
History and Physical    [name] KDX:[id number] DOB: 03-17-1932 DOA: 03/04/2016   PCP: [name], DO Chief Complaint:  Chief Complaint  Patient presents with  . Altered Mental Status  . Fever    HPI: [name] is a 82 y.o. male with medical history significant of ESRD dialysis MWF, recurrent episodes of Delirium with various medical problems requiring admission to hospital (usually AMS occurs due to bradycardia in past months, but has also occurred due to UTI as well), HTN, DM.  Patient is brought in by family to ED today with AMS.  Apparently he is not responding or behaving normally this evening per his wife.  Symptoms onset this evening when they went to bed, have persisted, nothing seemed to make worse, IVF, and ABx did seem to make better in ED.  ED Course: Initially altered and running fever of 101.  Given 1L ivf, tylenol, cefepime and vanc.  Fever has resolved, and patient is now very grouchy and expressing that he is unhappy being here.  We called his son on the phone who spoke with patient and convinced him to stay.  After discussion with son apparently this is actually probably closer to his baseline and "he gets like this some times" (AMS appears to have resolved).  Review of Systems: Not really able to perform due to AMS and patient not cooperating with exam.   Past Medical History  Diagnosis Date  . Hypertension   . Diabetes mellitus without complication (HCC)   . Arthritis   . History of blood transfusion   . GI bleed   . ESRD (end stage renal disease) (HCC)   . Seizure (HCC)   . Dialysis patient (HCC)   . Subdural hematoma (HCC) 05/2015    from fall  . Sleep apnea     wears CPAP n/c  . Pulmonary hypertension (HCC)     severe pulmonary HTN by 01/14/13 echo    Past Surgical History  Procedure Laterality Date  . Esophagogastroduodenoscopy  10/03/2012    Procedure: ESOPHAGOGASTRODUODENOSCOPY (EGD);  Surgeon: Theda Belfast, MD;  Location: [facility] ENDOSCOPY;   Service: Endoscopy;  Laterality: N/A;  . Colonoscopy  10/04/2012    Procedure: COLONOSCOPY;  Surgeon: Theda Belfast, MD;  Location: [facility] ENDOSCOPY;  Service: Endoscopy;  Laterality: N/A;  . Colonoscopy with esophagogastroduodenoscopy (egd) Left 11/30/2012    Procedure: COLONOSCOPY WITH ESOPHAGOGASTRODUODENOSCOPY (EGD);  Surgeon: Theda Belfast, MD;  Location: [facility] ENDOSCOPY;  Service: Endoscopy;  Laterality: Left;  . Spine surgery  2004    back fusion/ [facility] Penumalli,MD  . Retinal laser procedure Right 04/2015  . Av fistula placement Left 09/09/2015    Procedure: INSERTION OF ARTERIOVENOUS  GORE-TEX GRAFT Left  ARM;  Surgeon: Sherren Kerns, MD;  Location: [facility] OR;  Service: Vascular;  Laterality: Left;  . Insertion of dialysis catheter Right 11/27/2015    Procedure: INSERTION OF DIALYSIS CATHETER - Right Internal Jugular Placement;  Surgeon: Chuck Hint, MD;  Location: [facility] OR;  Service: Vascular;  Laterality: Right;  . Ep implantable device N/A 12/12/2015    Procedure: Pacemaker Implant;  Surgeon: Will Jorja Loa, MD;  Location: [facility] INVASIVE CV LAB;  Service: Cardiovascular;  Laterality: N/A;  . Peripheral vascular catheterization Left 01/27/2016    Procedure: Fistulagram;  Surgeon: Nada Libman, MD;  Location: [facility] INVASIVE CV LAB;  Service: Cardiovascular;  Laterality: Left;  upper arm  . Ligation arteriovenous gortex graft Left 01/27/2016    Procedure: LEFT LIGATION ARTERIOVENOUS GORTEX GRAFT;  Surgeon: Chuck Hint, MD;  Location: [facility] OR;  Service: Vascular;  Laterality: Left;     reports that he has never smoked. He has never used smokeless tobacco. He reports that he does not drink alcohol or use illicit drugs.  Allergies  Allergen Reactions  . Aspirin Other (See Comments)    Bleeding   . Lactose Intolerance (Gi) Diarrhea    Family History  Problem Relation Age of Onset  . Sudden death Mother   . Sudden death Father       Prior to Admission medications     Medication Sig Start Date End Date Taking? Authorizing Provider  amLODipine (NORVASC) 5 MG tablet Take 1 tablet (5 mg total) by mouth at bedtime. 02/26/16   [name] L [name], DO  buprenorphine (BUTRANS) 5 MCG/HR PTWK patch Place 1 patch (5 mcg total) onto the skin once a week. 02/26/16   [name] L [name], DO  calcitRIOL (ROCALTROL) 0.5 MCG capsule Take 2 capsules (1 mcg total) by mouth every Monday, Wednesday, and Friday with hemodialysis. 02/26/16   [name] L [name], DO  calcium acetate (PHOSLO) 667 MG capsule Take 3 capsules by mouth three times a day 02/26/16   [name] L [name], DO  colchicine 0.6 MG tablet Take 1 tablet (0.6 mg total) by mouth as needed. For gout. 02/26/16   [name] L [name], DO  divalproex (DEPAKOTE ER) 250 MG 24 hr tablet Take 1 tablet (250 mg total) by mouth every 12 (twelve) hours. 02/26/16   [name] L [name], DO  fexofenadine (ALLEGRA) 180 MG tablet Take 1 tablet (180 mg total) by mouth daily as needed for allergies or rhinitis. 02/26/16   [name] L [name], DO  folic acid (FOLVITE) 1 MG tablet Take 1 tablet (1 mg total) by mouth daily. 02/26/16   [name] L [name], DO  gabapentin (NEURONTIN) 100 MG capsule Take 1 capsule (100 mg total) by mouth 3 (three) times daily. 02/26/16   [name] L [name], DO  glucose blood (ACCU-CHEK AVIVA PLUS) test strip 1 each by Other route 3 (three) times daily. Use as instructed 01/21/16   [name] L [name], DO  HUMALOG KWIKPEN 100 UNIT/ML KiwkPen Inject 5 Units into the skin as directed.  02/19/16   Historical Provider, MD  Insulin Pen Needle 31G X 8 MM MISC Use as directed with insulin pens 03/01/16   [name] L [name], DO  lacosamide (VIMPAT) 50 MG TABS tablet 150mg  BID on M-W-F (dialysis days); 100mg  BID on Tue-Thu-Sat-Sun (non-dialysis days) 02/26/16   [name] L [name], DO  multivitamin (RENA-VIT) TABS tablet Take 1 tablet by mouth at bedtime. 02/26/16   [name] L [name], DO  pantoprazole (PROTONIX) 40 MG tablet Take 1 tablet (40 mg total) by mouth 2 (two) times daily. 02/26/16    [name] L [name], DO  sennosides-docusate sodium (SENOKOT-S) 8.6-50 MG tablet Take 1 tablet by mouth every evening. 02/26/16   12-[birth year]1978, DO    Physical Exam: Filed Vitals:   03/04/16 0430 03/04/16 0442 03/04/16 0445 03/04/16 0500  BP: 120/54  118/61 116/58  Pulse: 72  70 69  Temp:  98.8 F (37.1 C)    TempSrc:  Oral    Resp: 15  12 11   Weight:      SpO2: 95%  96% 96%      Constitutional: NAD, calm, comfortable, Eyes: PERRL, lids and conjunctivae normal ENMT: Mucous membranes are moist. Posterior pharynx clear of any exudate or lesions.Normal dentition.  Neck: normal, supple, no masses, no thyromegaly Respiratory:  clear to auscultation bilaterally, no wheezing, no crackles. Normal respiratory effort. No accessory muscle use.  Cardiovascular: Regular rate and rhythm, no murmurs / rubs / gallops. No extremity edema. 2+ pedal pulses. No carotid bruits.  Abdomen: no tenderness, no masses palpated. No hepatosplenomegaly. Bowel sounds positive.  Musculoskeletal: no clubbing / cyanosis. No joint deformity upper and lower extremities. Good ROM, no contractures. Normal muscle tone.  Skin: no rashes, lesions, ulcers. No induration Neurologic: CN 2-12 grossly intact. Sensation intact, DTR normal. Strength 5/5 in all 4.  Psychiatric: expressing that he is unhappy about being here, not really willing to answer orientation questions stating "I dont give a damn what year it is".  After his son convinces him to stay, he dosent really want to talk to me anymore.   Labs on Admission: I have personally reviewed following labs and imaging studies  CBC:  Recent Labs Lab 03/04/16 0202  WBC 2.5*  NEUTROABS 1.6*  HGB 12.9*  HCT 39.2  MCV 98.5  PLT 57*   Basic Metabolic Panel:  Recent Labs Lab 03/04/16 0202  NA 136  K 3.9  CL 100*  CO2 26  GLUCOSE 217*  BUN 35*  CREATININE 6.01*  CALCIUM 9.4   GFR: Estimated Creatinine Clearance: 10.4 mL/min (by C-G formula based on Cr of  6.01). Liver Function Tests:  Recent Labs Lab 03/04/16 0202  AST 37  ALT 25  ALKPHOS 72  BILITOT 0.5  PROT 7.0  ALBUMIN 3.5   No results for input(s): LIPASE, AMYLASE in the last 168 hours. No results for input(s): AMMONIA in the last 168 hours. Coagulation Profile: No results for input(s): INR, PROTIME in the last 168 hours. Cardiac Enzymes: No results for input(s): CKTOTAL, CKMB, CKMBINDEX, TROPONINI in the last 168 hours. BNP (last 3 results) No results for input(s): PROBNP in the last 8760 hours. HbA1C: No results for input(s): HGBA1C in the last 72 hours. CBG:  Recent Labs Lab 03/04/16 0136  GLUCAP 230*   Lipid Profile: No results for input(s): CHOL, HDL, LDLCALC, TRIG, CHOLHDL, LDLDIRECT in the last 72 hours. Thyroid Function Tests: No results for input(s): TSH, T4TOTAL, FREET4, T3FREE, THYROIDAB in the last 72 hours. Anemia Panel: No results for input(s): VITAMINB12, FOLATE, FERRITIN, TIBC, IRON, RETICCTPCT in the last 72 hours. Urine analysis:    Component Value Date/Time   COLORURINE YELLOW 03/04/2016 0200   APPEARANCEUR CLEAR 03/04/2016 0200   LABSPEC 1.013 03/04/2016 0200   PHURINE 7.5 03/04/2016 0200   GLUCOSEU 250* 03/04/2016 0200   HGBUR TRACE* 03/04/2016 0200   BILIRUBINUR NEGATIVE 03/04/2016 0200   KETONESUR NEGATIVE 03/04/2016 0200   PROTEINUR 100* 03/04/2016 0200   UROBILINOGEN 0.2 06/20/2015 0845   NITRITE NEGATIVE 03/04/2016 0200   LEUKOCYTESUR NEGATIVE 03/04/2016 0200   Sepsis Labs: (procalcitonin:4,lacticidven:4) )No results found for this or any previous visit (from the past 240 hour(s)).   Radiological Exams on Admission: Dg Chest Port 1 View  03/04/2016  CLINICAL DATA:  Altered mental status. EXAM: PORTABLE CHEST 1 VIEW COMPARISON:  12/13/2015 FINDINGS: Intact transvenous cardiac leads. Right jugular dual-lumen central line extending into the low SVC. The lungs are clear. The pulmonary vasculature is normal. No large  effusions. IMPRESSION: No active disease. Electronically Signed   By: Ellery Plunk M.D.   On: 03/04/2016 01:45    EKG: Independently reviewed.  Assessment/Plan Principal Problem:   Acute encephalopathy Active Problems:   ESRD on dialysis (HCC)   Fever   SIRS (systemic inflammatory response syndrome) (HCC)  Thrombocytopenia (HCC)   Delirium    Acute encephalopathy, Delirium - secondary to fever / SIRS  Appears improved now that fever has resolved, although patient isnt really cooperating with answering orientation questions right now.  Will admit and continue to monitor  Tylenol PRN fever  Cultures pending  For now continuing the broad spectrum cefepime and vanc that EDP started.  ESRD -  Dialysis MWF  Call nephrology if patient still here tomorrow to schedule routine dialysis  Thrombocytopenia - appears to be acute on chronic  DIC pnl ordered  Not really anemic enough for me to be worried about TTP  HIT pnl ordered as well  DM - sensitive scale SSI AC/HS   DVT prophylaxis: SCDs only due to thrombocytopenia Code Status: DNR Family Communication: Spoke with son on phone who convinced patient to stay Consults called: None Admission status: Admit to obs   Hillary Bow DO [facility] Pager [phone] from 7PM-7AM  If 7AM-7PM, please contact the day physician for the patient [email or web address] Password [email or web address]  03/04/2016, 5:46 AM

## 2016-03-05 ENCOUNTER — Observation Stay (HOSPITAL_BASED_OUTPATIENT_CLINIC_OR_DEPARTMENT_OTHER): Payer: Medicare Other

## 2016-03-05 DIAGNOSIS — I120 Hypertensive chronic kidney disease with stage 5 chronic kidney disease or end stage renal disease: Secondary | ICD-10-CM | POA: Diagnosis not present

## 2016-03-05 DIAGNOSIS — R509 Fever, unspecified: Secondary | ICD-10-CM | POA: Diagnosis not present

## 2016-03-05 DIAGNOSIS — G9340 Encephalopathy, unspecified: Secondary | ICD-10-CM | POA: Diagnosis not present

## 2016-03-05 DIAGNOSIS — N186 End stage renal disease: Secondary | ICD-10-CM | POA: Diagnosis not present

## 2016-03-05 DIAGNOSIS — Z992 Dependence on renal dialysis: Secondary | ICD-10-CM | POA: Diagnosis not present

## 2016-03-05 LAB — GLUCOSE, CAPILLARY
GLUCOSE-CAPILLARY: 199 mg/dL — AB (ref 65–99)
GLUCOSE-CAPILLARY: 268 mg/dL — AB (ref 65–99)
Glucose-Capillary: 183 mg/dL — ABNORMAL HIGH (ref 65–99)

## 2016-03-05 LAB — BLOOD CULTURE ID PANEL (REFLEXED)
ACINETOBACTER BAUMANNII: NOT DETECTED
CANDIDA GLABRATA: NOT DETECTED
CANDIDA KRUSEI: NOT DETECTED
CANDIDA PARAPSILOSIS: NOT DETECTED
CANDIDA TROPICALIS: NOT DETECTED
CARBAPENEM RESISTANCE: NOT DETECTED
Candida albicans: NOT DETECTED
ESCHERICHIA COLI: NOT DETECTED
Enterobacter cloacae complex: NOT DETECTED
Enterobacteriaceae species: NOT DETECTED
Enterococcus species: NOT DETECTED
Haemophilus influenzae: NOT DETECTED
KLEBSIELLA OXYTOCA: NOT DETECTED
Klebsiella pneumoniae: NOT DETECTED
LISTERIA MONOCYTOGENES: NOT DETECTED
Methicillin resistance: NOT DETECTED
NEISSERIA MENINGITIDIS: NOT DETECTED
PROTEUS SPECIES: NOT DETECTED
Pseudomonas aeruginosa: NOT DETECTED
SERRATIA MARCESCENS: NOT DETECTED
STAPHYLOCOCCUS SPECIES: NOT DETECTED
STREPTOCOCCUS SPECIES: NOT DETECTED
Staphylococcus aureus (BCID): NOT DETECTED
Streptococcus agalactiae: NOT DETECTED
Streptococcus pneumoniae: NOT DETECTED
Streptococcus pyogenes: NOT DETECTED
Vancomycin resistance: NOT DETECTED

## 2016-03-05 LAB — BASIC METABOLIC PANEL
ANION GAP: 11 (ref 5–15)
BUN: 51 mg/dL — ABNORMAL HIGH (ref 6–20)
CALCIUM: 9.2 mg/dL (ref 8.9–10.3)
CHLORIDE: 99 mmol/L — AB (ref 101–111)
CO2: 23 mmol/L (ref 22–32)
Creatinine, Ser: 7.71 mg/dL — ABNORMAL HIGH (ref 0.61–1.24)
GFR calc non Af Amer: 6 mL/min — ABNORMAL LOW (ref >60)
GFR, EST AFRICAN AMERICAN: 7 mL/min — AB (ref >60)
Glucose, Bld: 125 mg/dL — ABNORMAL HIGH (ref 65–99)
Potassium: 4.1 mmol/L (ref 3.5–5.1)
SODIUM: 133 mmol/L — AB (ref 135–145)

## 2016-03-05 LAB — CBC
HCT: 35.1 % — ABNORMAL LOW (ref 39.0–52.0)
HEMOGLOBIN: 11.4 g/dL — AB (ref 13.0–17.0)
MCH: 31.8 pg (ref 26.0–34.0)
MCHC: 32.5 g/dL (ref 30.0–36.0)
MCV: 98 fL (ref 78.0–100.0)
Platelets: 64 10*3/uL — ABNORMAL LOW (ref 150–400)
RBC: 3.58 MIL/uL — AB (ref 4.22–5.81)
RDW: 15.9 % — ABNORMAL HIGH (ref 11.5–15.5)
WBC: 3.5 10*3/uL — AB (ref 4.0–10.5)

## 2016-03-05 LAB — URINE CULTURE: CULTURE: NO GROWTH

## 2016-03-05 LAB — HEPARIN INDUCED PLATELET AB (HIT ANTIBODY): Heparin Induced Plt Ab: 0.936 OD — ABNORMAL HIGH (ref 0.000–0.400)

## 2016-03-05 MED ORDER — CALCITRIOL 0.5 MCG PO CAPS
ORAL_CAPSULE | ORAL | Status: AC
  Filled 2016-03-05: qty 2

## 2016-03-05 MED ORDER — VANCOMYCIN HCL IN DEXTROSE 1-5 GM/200ML-% IV SOLN
INTRAVENOUS | Status: AC
  Filled 2016-03-05: qty 200

## 2016-03-05 NOTE — Progress Notes (Signed)
PROGRESS NOTE  [name]:[id number] DOB: Dec 16, 1931 DOA: 03/04/2016 PCP: [name], DO     Brief Narrative: Patient is a 82 year old African American male who presents with altered mental status the night of admission. His past medical history is significant for ESRD on dialysis, recurrent episodes of delirium, HTN, DM, seizure disorder and dementia secondary to history of subdural hematoma. He has had multiple episodes of delirium resulting in hospitalization, usually due to bradycardia in the past months, but has also occurred due to UTI). Patient was found to have a fever of 101 at the ED and was given IVF, tylenol, cefepime and vancomycin, which seemed to improve patient's AMS. Fever has been resolved since admission with no clear etiology. He is back to baseline per patient's wife.   Assessment & Plan: Principal Problem:   Acute encephalopathy Active Problems:   ESRD on dialysis (HCC)   Fever   SIRS (systemic inflammatory response syndrome) (HCC)   Thrombocytopenia (HCC)   Delirium   Acute encephalopathy, Delirium - secondary to fever / SIRS - Resolved. Patient with baseline dementia, resulting from 2 episodes of subdural hematoma. CT head on admission showed no acute intracranial findings.  - no clear infectious source identified, patient has no respiratory / urinary symptoms. No sick contacts or recent travels.  - lactic acid normal. Urine culture no growth. Pending blood cultures so far negative. CXR unremarkable.  - without a clear source will d/c antibiotics and closely monitor fever curve, if afebrile will return home tomorrow  Fever - Unclear etiology. Patient has no chest pain, no tachycardia, no hypoxia and doubt PE. Has been afebrile.  - LE [place] negative for DVT - infectious workup unremarkable  ESRD  - Dialysis MWF. Received dialysis today.   Thrombocytopenia  - Appears to be acute on chronic. Improved today  DM  - Sensitive scale SSI AC/HS  DVT  prophylaxis: SCDs only due to thrombocytopenia Code Status: DNR Family Communication: Wife at bedside  Disposition Plan: Home tomorrow  Consultants:   None  Procedures:   HD: MWF  LE [place]  Antimicrobials:  Vancomycin 6/28 >> 6/29  Zosyn 6/28 >> 6/30  Subjective: Patient had hemodialysis today. Back to baseline per patient's wife. Patient denies recent illness. Denies cough, SOB, CP, rhinorrhea. Denies dysuria, polyuria. Denies N/V/D. No sick contacts or recent travels.   Objective: Filed Vitals:   03/05/16 1027 03/05/16 1046 03/05/16 1051 03/05/16 1139  BP: 118/54 108/55 113/61 116/56  Pulse: 68 65 67 70  Temp:   98.1 F (36.7 C) 98.4 F (36.9 C)  TempSrc:   Oral Oral  Resp:    18  Height:      Weight:   87 kg (191 lb 12.8 oz)   SpO2:  97% 97% 97%    Intake/Output Summary (Last 24 hours) at 03/05/16 1318 Last data filed at 03/05/16 1051  Gross per 24 hour  Intake    540 ml  Output    500 ml  Net     40 ml   Filed Weights   03/04/16 0643 03/05/16 0700 03/05/16 1051  Weight: 84.687 kg (186 lb 11.2 oz) 87.3 kg (192 lb 7.4 oz) 87 kg (191 lb 12.8 oz)    Examination: Constitutional: NAD Filed Vitals:   03/05/16 1027 03/05/16 1046 03/05/16 1051 03/05/16 1139  BP: 118/54 108/55 113/61 116/56  Pulse: 68 65 67 70  Temp:   98.1 F (36.7 C) 98.4 F (36.9 C)  TempSrc:   Oral Oral  Resp:    18  Height:      Weight:   87 kg (191 lb 12.8 oz)   SpO2:  97% 97% 97%   Respiratory: clear to auscultation bilaterally, no wheezing, no crackles. Normal respiratory effort. No accessory muscle use.  Cardiovascular: Regular rate and rhythm, no murmurs / rubs / gallops.  Abdomen: no tenderness. Bowel sounds positive.  Musculoskeletal: no clubbing / cyanosis.   Skin: no rashes, lesions, ulcers.  Neurologic: non focal     Data Reviewed: I have personally reviewed following labs and imaging studies  CBC:  Recent Labs Lab 03/04/16 0202 03/04/16 0646 03/05/16 0608  WBC  2.5*  --  3.5*  NEUTROABS 1.6*  --   --   HGB 12.9*  --  11.4*  HCT 39.2  --  35.1*  MCV 98.5  --  98.0  PLT 57* 53* 64*   Basic Metabolic Panel:  Recent Labs Lab 03/04/16 0202 03/05/16 0608  NA 136 133*  K 3.9 4.1  CL 100* 99*  CO2 26 23  GLUCOSE 217* 125*  BUN 35* 51*  CREATININE 6.01* 7.71*  CALCIUM 9.4 9.2   GFR: Estimated Creatinine Clearance: 8.1 mL/min (by C-G formula based on Cr of 7.71). Liver Function Tests:  Recent Labs Lab 03/04/16 0202  AST 37  ALT 25  ALKPHOS 72  BILITOT 0.5  PROT 7.0  ALBUMIN 3.5   No results for input(s): LIPASE, AMYLASE in the last 168 hours. No results for input(s): AMMONIA in the last 168 hours. Coagulation Profile:  Recent Labs Lab 03/04/16 0646  INR 1.21   Cardiac Enzymes: No results for input(s): CKTOTAL, CKMB, CKMBINDEX, TROPONINI in the last 168 hours. BNP (last 3 results) No results for input(s): PROBNP in the last 8760 hours. HbA1C: No results for input(s): HGBA1C in the last 72 hours. CBG:  Recent Labs Lab 03/04/16 0811 03/04/16 1144 03/04/16 1704 03/04/16 2156 03/05/16 1137  GLUCAP 161* 190* 224* 200* 183*   Lipid Profile: No results for input(s): CHOL, HDL, LDLCALC, TRIG, CHOLHDL, LDLDIRECT in the last 72 hours. Thyroid Function Tests: No results for input(s): TSH, T4TOTAL, FREET4, T3FREE, THYROIDAB in the last 72 hours. Anemia Panel: No results for input(s): VITAMINB12, FOLATE, FERRITIN, TIBC, IRON, RETICCTPCT in the last 72 hours. Urine analysis:    Component Value Date/Time   COLORURINE YELLOW 03/04/2016 0200   APPEARANCEUR CLEAR 03/04/2016 0200   LABSPEC 1.013 03/04/2016 0200   PHURINE 7.5 03/04/2016 0200   GLUCOSEU 250* 03/04/2016 0200   HGBUR TRACE* 03/04/2016 0200   BILIRUBINUR NEGATIVE 03/04/2016 0200   KETONESUR NEGATIVE 03/04/2016 0200   PROTEINUR 100* 03/04/2016 0200   UROBILINOGEN 0.2 06/20/2015 0845   NITRITE NEGATIVE 03/04/2016 0200   LEUKOCYTESUR NEGATIVE 03/04/2016 0200    Sepsis Labs: Invalid input(s): PROCALCITONIN, LACTICIDVEN  Recent Results (from the past 240 hour(s))  Urine culture     Status: None   Collection Time: 03/04/16  2:00 AM  Result Value Ref Range Status   Specimen Description URINE, CATHETERIZED  Final   Special Requests NONE  Final   Culture NO GROWTH  Final   Report Status 03/05/2016 FINAL  Final  MRSA PCR Screening     Status: None   Collection Time: 03/04/16  6:44 AM  Result Value Ref Range Status   MRSA by PCR NEGATIVE NEGATIVE Final    Comment:        The GeneXpert MRSA Assay (FDA approved for NASAL specimens only), is one component of a comprehensive  MRSA colonization surveillance program. It is not intended to diagnose MRSA infection nor to guide or monitor treatment for MRSA infections.       Radiology Studies: Ct Head Wo Contrast  03/04/2016  CLINICAL DATA:  Altered mental status and fever. EXAM: CT HEAD WITHOUT CONTRAST TECHNIQUE: Contiguous axial images were obtained from the base of the skull through the vertex without intravenous contrast. COMPARISON:  10/23/2015 FINDINGS: There is no intracranial hemorrhage, mass or evidence of acute infarction. There is moderately severe generalized atrophy. There is extensive chronic microvascular ischemic change. There is no significant extra-axial fluid collection. No acute intracranial findings are evident. The calvarium and skullbase are intact. The visible paranasal sinuses and orbits are unremarkable. IMPRESSION: Moderately severe generalized atrophy and chronic white matter hypodensities which likely represent small vessel ischemic disease. No acute intracranial findings. Electronically Signed   By: Ellery Plunk M.D.   On: 03/04/2016 06:53   Dg Chest Port 1 View  03/04/2016  CLINICAL DATA:  Altered mental status. EXAM: PORTABLE CHEST 1 VIEW COMPARISON:  12/13/2015 FINDINGS: Intact transvenous cardiac leads. Right jugular dual-lumen central line extending into the low  SVC. The lungs are clear. The pulmonary vasculature is normal. No large effusions. IMPRESSION: No active disease. Electronically Signed   By: Ellery Plunk M.D.   On: 03/04/2016 01:45     Scheduled Meds: . amLODipine  5 mg Oral QHS  . calcitRIOL      . calcitRIOL  1 mcg Oral Q M,W,F-HD  . calcium acetate  2,001 mg Oral TID WC  . divalproex  250 mg Oral Q12H  . folic acid  1 mg Oral Daily  . gabapentin  100 mg Oral TID  . insulin aspart  0-9 Units Subcutaneous TID WC  . lacosamide  100 mg Oral 2 times per day on Sun Tue Thu Sat  . lacosamide  150 mg Oral 2 times per day on Mon Wed Fri  . loratadine  10 mg Oral Daily  . multivitamin  1 tablet Oral QHS  . pantoprazole  40 mg Oral BID  . senna-docusate  1 tablet Oral QPM   Continuous Infusions:    [name], MD, PhD [facility] Pager [phone]  If 7PM-7AM, please contact night-coverage [email or web address] Password [email or web address] 03/05/2016, 1:18 PM

## 2016-03-05 NOTE — Procedures (Signed)
  I was present at this dialysis session, have reviewed the session itself and made  appropriate changes [name] [facility] pager [phone]    cell [phone] 03/05/2016, 9:17 AM

## 2016-03-05 NOTE — Progress Notes (Signed)
Spoke with wife about patient being transferred to [phone]. I also ordered patient a renal/cc tray.

## 2016-03-05 NOTE — Progress Notes (Signed)
*  Preliminary Results* Bilateral lower extremity venous duplex completed. Study was limited due to poor patient cooperation. Patient refused completion of exam, therefore the left popliteal, posterior tibial, and peroneal veins were not adequately evaluated. Visualized veins of bilateral lower extremities are negative for deep vein thrombosis. There is no evidence of Baker's cyst bilaterally.  03/05/2016  Gertie Fey, RVT, RDCS, RDMS

## 2016-03-05 NOTE — Progress Notes (Signed)
PHARMACY - PHYSICIAN COMMUNICATION CRITICAL VALUE ALERT - BLOOD CULTURE IDENTIFICATION (BCID)  Results for orders placed or performed during the hospital encounter of 03/04/16  Blood Culture ID Panel (Reflexed) (Collected: 03/04/2016  2:20 AM)  Result Value Ref Range   Enterococcus species NOT DETECTED NOT DETECTED   Vancomycin resistance NOT DETECTED NOT DETECTED   Listeria monocytogenes NOT DETECTED NOT DETECTED   Staphylococcus species NOT DETECTED NOT DETECTED   Staphylococcus aureus NOT DETECTED NOT DETECTED   Methicillin resistance NOT DETECTED NOT DETECTED   Streptococcus species NOT DETECTED NOT DETECTED   Streptococcus agalactiae NOT DETECTED NOT DETECTED   Streptococcus pneumoniae NOT DETECTED NOT DETECTED   Streptococcus pyogenes NOT DETECTED NOT DETECTED   Acinetobacter baumannii NOT DETECTED NOT DETECTED   Enterobacteriaceae species NOT DETECTED NOT DETECTED   Enterobacter cloacae complex NOT DETECTED NOT DETECTED   Escherichia coli NOT DETECTED NOT DETECTED   Klebsiella oxytoca NOT DETECTED NOT DETECTED   Klebsiella pneumoniae NOT DETECTED NOT DETECTED   Proteus species NOT DETECTED NOT DETECTED   Serratia marcescens NOT DETECTED NOT DETECTED   Carbapenem resistance NOT DETECTED NOT DETECTED   Haemophilus influenzae NOT DETECTED NOT DETECTED   Neisseria meningitidis NOT DETECTED NOT DETECTED   Pseudomonas aeruginosa NOT DETECTED NOT DETECTED   Candida albicans NOT DETECTED NOT DETECTED   Candida glabrata NOT DETECTED NOT DETECTED   Candida krusei NOT DETECTED NOT DETECTED   Candida parapsilosis NOT DETECTED NOT DETECTED   Candida tropicalis NOT DETECTED NOT DETECTED    Name of physician (or Provider) Contacted: text paged M. Lynch re: gram stain showing GPR with no BCID result  Changes to prescribed antibiotics required: no changes at this point, likely contaminant  [facility], [address].D., BCPS Clinical Pharmacist Pager: [phone] 03/05/2016 10:48 PM

## 2016-03-06 DIAGNOSIS — G9340 Encephalopathy, unspecified: Secondary | ICD-10-CM | POA: Diagnosis not present

## 2016-03-06 LAB — GLUCOSE, CAPILLARY
GLUCOSE-CAPILLARY: 163 mg/dL — AB (ref 65–99)
GLUCOSE-CAPILLARY: 154 mg/dL — AB (ref 65–99)
GLUCOSE-CAPILLARY: 161 mg/dL — AB (ref 65–99)
GLUCOSE-CAPILLARY: 140 mg/dL — AB (ref 65–99)

## 2016-03-06 LAB — HIV ANTIBODY (ROUTINE TESTING W REFLEX): HIV SCREEN 4TH GENERATION: NONREACTIVE

## 2016-03-06 NOTE — Progress Notes (Signed)
PROGRESS NOTE  [name] QIW:[id number] DOB: June 01, 1932 DOA: 03/04/2016 PCP: [name], DO    Brief Narrative: Patient is a 82 year old African American male who presents with altered mental status the night of admission. His past medical history is significant for ESRD on dialysis, recurrent episodes of delirium, HTN, DM, seizure disorder and dementia secondary to history of subdural hematoma. He has had multiple episodes of delirium resulting in hospitalization, usually due to bradycardia in the past months, but has also occurred due to UTI). Patient was found to have a fever of 101 at the ED and was given IVF, tylenol, cefepime and vancomycin, which seemed to improve patient's AMS. Fever has been resolved since admission with no clear etiology. He is back to baseline per patient's wife.   Assessment & Plan: Principal Problem:   Acute encephalopathy Active Problems:   ESRD on dialysis (HCC)   Fever   SIRS (systemic inflammatory response syndrome) (HCC)   Thrombocytopenia (HCC)   Delirium   Acute encephalopathy, Delirium - secondary to fever / SIRS - Resolved. Patient with baseline dementia, resulting from 2 episodes of subdural hematoma. CT head on admission showed no acute intracranial findings.  - no clear infectious source identified, patient has no respiratory / urinary symptoms. No sick contacts or recent travels.  - lactic acid normal. Urine culture no growth. Pending blood cultures so far negative. CXR unremarkable.  - without a clear source antibiotics discontinued 6/30 and monitor off antibiotics.  - afebrile still however 1/2 bottles gram positive rods. Blood cultures reflex panel negative. Likely contaminant but will have to wait until speciation. Discussed with ID Dr. Luciana Axe over the phone  Fever - Unclear etiology. Patient has no chest pain, no tachycardia, no hypoxia and doubt PE. Has been afebrile.  - LE [place] negative for DVT - infectious workup unremarkable  ESRD   - Dialysis MWF. Received dialysis today.   Thrombocytopenia  - Appears to be acute on chronic. Improved today  DM  - Sensitive scale SSI AC/HS  DVT prophylaxis: SCDs only due to thrombocytopenia Code Status: DNR Family Communication: Wife over the phone Disposition Plan: Home tomorrow  Consultants:   None  Procedures:   HD: MWF  LE [place]  Antimicrobials:  Vancomycin 6/28 >> 6/29  Zosyn 6/28 >> 6/30  Subjective: - no chest pain, shortness of breath, no abdominal pain, nausea or vomiting.  Objective: Filed Vitals:   03/05/16 1706 03/05/16 2137 03/06/16 0601 03/06/16 0830  BP: 154/60 155/62 124/52 123/66  Pulse: 73 72 75 73  Temp: 98.7 F (37.1 C) 98.5 F (36.9 C) 99.2 F (37.3 C) 98 F (36.7 C)  TempSrc: Oral Oral Oral Oral  Resp: 17 16 18 17   Height:      Weight:      SpO2: 99% 99% 95% 95%    Intake/Output Summary (Last 24 hours) at 03/06/16 1419 Last data filed at 03/06/16 0930  Gross per 24 hour  Intake    320 ml  Output    300 ml  Net     20 ml   Filed Weights   03/04/16 0643 03/05/16 0700 03/05/16 1051  Weight: 84.687 kg (186 lb 11.2 oz) 87.3 kg (192 lb 7.4 oz) 87 kg (191 lb 12.8 oz)    Examination: Constitutional: NAD Filed Vitals:   03/05/16 1706 03/05/16 2137 03/06/16 0601 03/06/16 0830  BP: 154/60 155/62 124/52 123/66  Pulse: 73 72 75 73  Temp: 98.7 F (37.1 C) 98.5 F (36.9 C) 99.2  F (37.3 C) 98 F (36.7 C)  TempSrc: Oral Oral Oral Oral  Resp: 17 16 18 17   Height:      Weight:      SpO2: 99% 99% 95% 95%   Respiratory: clear to auscultation bilaterally, no wheezing, no crackles. Normal respiratory effort. No accessory muscle use.  Cardiovascular: Regular rate and rhythm, no murmurs / rubs / gallops.  Abdomen: no tenderness. Bowel sounds positive.  Musculoskeletal: no clubbing / cyanosis.   Skin: no rashes, lesions, ulcers.  Neurologic: non focal     Data Reviewed: I have personally reviewed following labs and imaging  studies  CBC:  Recent Labs Lab 03/04/16 0202 03/04/16 0646 03/05/16 0608  WBC 2.5*  --  3.5*  NEUTROABS 1.6*  --   --   HGB 12.9*  --  11.4*  HCT 39.2  --  35.1*  MCV 98.5  --  98.0  PLT 57* 53* 64*   Basic Metabolic Panel:  Recent Labs Lab 03/04/16 0202 03/05/16 0608  NA 136 133*  K 3.9 4.1  CL 100* 99*  CO2 26 23  GLUCOSE 217* 125*  BUN 35* 51*  CREATININE 6.01* 7.71*  CALCIUM 9.4 9.2   GFR: Estimated Creatinine Clearance: 8.1 mL/min (by C-G formula based on Cr of 7.71). Liver Function Tests:  Recent Labs Lab 03/04/16 0202  AST 37  ALT 25  ALKPHOS 72  BILITOT 0.5  PROT 7.0  ALBUMIN 3.5   No results for input(s): LIPASE, AMYLASE in the last 168 hours. No results for input(s): AMMONIA in the last 168 hours. Coagulation Profile:  Recent Labs Lab 03/04/16 0646  INR 1.21   Cardiac Enzymes: No results for input(s): CKTOTAL, CKMB, CKMBINDEX, TROPONINI in the last 168 hours. BNP (last 3 results) No results for input(s): PROBNP in the last 8760 hours. HbA1C: No results for input(s): HGBA1C in the last 72 hours. CBG:  Recent Labs Lab 03/05/16 1137 03/05/16 1705 03/05/16 2107 03/06/16 0743 03/06/16 1118  GLUCAP 183* 199* 268* 163* 154*   Lipid Profile: No results for input(s): CHOL, HDL, LDLCALC, TRIG, CHOLHDL, LDLDIRECT in the last 72 hours. Thyroid Function Tests: No results for input(s): TSH, T4TOTAL, FREET4, T3FREE, THYROIDAB in the last 72 hours. Anemia Panel: No results for input(s): VITAMINB12, FOLATE, FERRITIN, TIBC, IRON, RETICCTPCT in the last 72 hours. Urine analysis:    Component Value Date/Time   COLORURINE YELLOW 03/04/2016 0200   APPEARANCEUR CLEAR 03/04/2016 0200   LABSPEC 1.013 03/04/2016 0200   PHURINE 7.5 03/04/2016 0200   GLUCOSEU 250* 03/04/2016 0200   HGBUR TRACE* 03/04/2016 0200   BILIRUBINUR NEGATIVE 03/04/2016 0200   KETONESUR NEGATIVE 03/04/2016 0200   PROTEINUR 100* 03/04/2016 0200   UROBILINOGEN 0.2 06/20/2015  0845   NITRITE NEGATIVE 03/04/2016 0200   LEUKOCYTESUR NEGATIVE 03/04/2016 0200   Sepsis Labs: Invalid input(s): PROCALCITONIN, LACTICIDVEN  Recent Results (from the past 240 hour(s))  Urine culture     Status: None   Collection Time: 03/04/16  2:00 AM  Result Value Ref Range Status   Specimen Description URINE, CATHETERIZED  Final   Special Requests NONE  Final   Culture NO GROWTH  Final   Report Status 03/05/2016 FINAL  Final  Blood Culture (routine x 2)     Status: None (Preliminary result)   Collection Time: 03/04/16  2:02 AM  Result Value Ref Range Status   Specimen Description BLOOD RIGHT HAND  Final   Special Requests IN PEDIATRIC BOTTLE  Final   Culture  NO GROWTH 1 DAY  Final   Report Status PENDING  Incomplete  Blood Culture (routine x 2)     Status: None (Preliminary result)   Collection Time: 03/04/16  2:20 AM  Result Value Ref Range Status   Specimen Description BLOOD RIGHT ARM  Final   Special Requests BOTTLES DRAWN AEROBIC AND ANAEROBIC  Final   Culture  Setup Time   Final    GRAM POSITIVE RODS AEROBIC BOTTLE ONLY Organism ID to follow CRITICAL RESULT CALLED TO, READ BACK BY AND VERIFIED WITH: J McGill PHARMD 2020 03/05/16 A BROWNING    Culture TOO YOUNG TO READ  Final   Report Status PENDING  Incomplete  Blood Culture ID Panel (Reflexed)     Status: None   Collection Time: 03/04/16  2:20 AM  Result Value Ref Range Status   Enterococcus species NOT DETECTED NOT DETECTED Final   Vancomycin resistance NOT DETECTED NOT DETECTED Final   Listeria monocytogenes NOT DETECTED NOT DETECTED Final   Staphylococcus species NOT DETECTED NOT DETECTED Final   Staphylococcus aureus NOT DETECTED NOT DETECTED Final   Methicillin resistance NOT DETECTED NOT DETECTED Final   Streptococcus species NOT DETECTED NOT DETECTED Final   Streptococcus agalactiae NOT DETECTED NOT DETECTED Final   Streptococcus pneumoniae NOT DETECTED NOT DETECTED Final   Streptococcus pyogenes  NOT DETECTED NOT DETECTED Final   Acinetobacter baumannii NOT DETECTED NOT DETECTED Final   Enterobacteriaceae species NOT DETECTED NOT DETECTED Final   Enterobacter cloacae complex NOT DETECTED NOT DETECTED Final   Escherichia coli NOT DETECTED NOT DETECTED Final   Klebsiella oxytoca NOT DETECTED NOT DETECTED Final   Klebsiella pneumoniae NOT DETECTED NOT DETECTED Final   Proteus species NOT DETECTED NOT DETECTED Final   Serratia marcescens NOT DETECTED NOT DETECTED Final   Carbapenem resistance NOT DETECTED NOT DETECTED Final   Haemophilus influenzae NOT DETECTED NOT DETECTED Final   Neisseria meningitidis NOT DETECTED NOT DETECTED Final   Pseudomonas aeruginosa NOT DETECTED NOT DETECTED Final   Candida albicans NOT DETECTED NOT DETECTED Final   Candida glabrata NOT DETECTED NOT DETECTED Final   Candida krusei NOT DETECTED NOT DETECTED Final   Candida parapsilosis NOT DETECTED NOT DETECTED Final   Candida tropicalis NOT DETECTED NOT DETECTED Final  MRSA PCR Screening     Status: None   Collection Time: 03/04/16  6:44 AM  Result Value Ref Range Status   MRSA by PCR NEGATIVE NEGATIVE Final    Comment:        The GeneXpert MRSA Assay (FDA approved for NASAL specimens only), is one component of a comprehensive MRSA colonization surveillance program. It is not intended to diagnose MRSA infection nor to guide or monitor treatment for MRSA infections.       Radiology Studies: No results found.   Scheduled Meds: . amLODipine  5 mg Oral QHS  . calcitRIOL  1 mcg Oral Q M,W,F-HD  . calcium acetate  2,001 mg Oral TID WC  . divalproex  250 mg Oral Q12H  . folic acid  1 mg Oral Daily  . gabapentin  100 mg Oral TID  . insulin aspart  0-9 Units Subcutaneous TID WC  . lacosamide  100 mg Oral 2 times per day on Sun Tue Thu Sat  . lacosamide  150 mg Oral 2 times per day on Mon Wed Fri  . loratadine  10 mg Oral Daily  . multivitamin  1 tablet Oral QHS  . pantoprazole  40 mg Oral BID   .  senna-docusate  1 tablet Oral QPM   Continuous Infusions:    [name], MD, PhD [facility] Pager [phone]  If 7PM-7AM, please contact night-coverage [email or web address] Password [email or web address] 03/06/2016, 2:19 PM

## 2016-03-07 DIAGNOSIS — G9340 Encephalopathy, unspecified: Secondary | ICD-10-CM | POA: Diagnosis not present

## 2016-03-07 LAB — CULTURE, BLOOD (ROUTINE X 2)

## 2016-03-07 LAB — GLUCOSE, CAPILLARY: Glucose-Capillary: 183 mg/dL — ABNORMAL HIGH (ref 65–99)

## 2016-03-07 NOTE — Discharge Summary (Signed)
Physician Discharge Summary  [name] VHQ:[id number] DOB: 12-17-1931 DOA: 03/04/2016  PCP: [name], DO  Admit date: 03/04/2016 Discharge date: 03/07/2016  Admitted From: home Disposition:  home  Recommendations for Outpatient Follow-up:  1. Follow up with PCP in 1-2 weeks 2. Please obtain BMP/CBC in one week 3. Please follow up on the following pending results:  Home Health: yes  Discharge Condition: stable CODE STATUS: DNR Diet recommendation: renal  Discharge Diagnoses:  Principal Problem:   Acute encephalopathy Active Problems:   ESRD on dialysis (HCC)   Fever   SIRS (systemic inflammatory response syndrome) (HCC)   Thrombocytopenia (HCC)   Delirium  HPI Patient is a 82 year old [place] American male who presents with altered mental status the night of admission. His past medical history is significant for ESRD on dialysis, recurrent episodes of delirium, HTN, DM, seizure disorder and dementia secondary to history of subdural hematoma. He has had multiple episodes of delirium resulting in hospitalization, usually due to bradycardia in the past months, but has also occurred due to UTI). Patient was found to have a fever of 101 at the ED and was given IVF, tylenol, cefepime and vancomycin, which seemed to improve patient's AMS.  Hospital course Acute encephalopathy, Delirium - secondary to fever / SIRS - Resolved. Patient with baseline mild dementia, resulting from 2 episodes of subdural hematoma. CT head on admission showed no acute intracranial findings.  No clear infectious source identified, patient has no respiratory / urinary symptoms. No sick contacts or recent travels. Lactic acid normal. Urine culture no growth. CXR unremarkable. Patient's blood cultures reflex panel negative. Regular blood cultures 1/2 bottles with diphtheroids, likely contaminant. Discussed with ID. Without a clear source antibiotics were discontinued on 6/30 and patient was monitored for 48  h without fever and discharged home in stable condition. He had an elevated D dimer which is hard to interpret with ESRD. Patient has no chest pain, no tachycardia, no hypoxia and doubt PE. LE [place] negative for DVT ESRD - Dialysis MWF. Received dialysis x 1 while hospitalized. Thrombocytopenia - Appears to be acute on chronic. DM - Sensitive scale SSI AC/HS Hx symptomatic bradycardia - s/p pacemaker  Discharge Instructions     Medication List    TAKE these medications        amLODipine 5 MG tablet  Commonly known as:  NORVASC  Take 1 tablet (5 mg total) by mouth at bedtime.     calcitRIOL 0.5 MCG capsule  Commonly known as:  ROCALTROL  Take 2 capsules (1 mcg total) by mouth every Monday, Wednesday, and Friday with hemodialysis.     calcium acetate 667 MG capsule  Commonly known as:  PHOSLO  Take 3 capsules by mouth three times a day     divalproex 250 MG 24 hr tablet  Commonly known as:  DEPAKOTE ER  Take 1 tablet (250 mg total) by mouth every 12 (twelve) hours.     fexofenadine 180 MG tablet  Commonly known as:  ALLEGRA  Take 1 tablet (180 mg total) by mouth daily as needed for allergies or rhinitis.     folic acid 1 MG tablet  Commonly known as:  FOLVITE  Take 1 tablet (1 mg total) by mouth daily.     gabapentin 100 MG capsule  Commonly known as:  NEURONTIN  Take 1 capsule (100 mg total) by mouth 3 (three) times daily.     glucose blood test strip  Commonly known as:  ACCU-CHEK AVIVA  PLUS  1 each by Other route 3 (three) times daily. Use as instructed     HUMALOG KWIKPEN 100 UNIT/ML KiwkPen  Generic drug:  insulin lispro  Inject 5 Units into the skin See admin instructions. Per sliding scale     Insulin Pen Needle 31G X 8 MM Misc  Use as directed with insulin pens     lacosamide 50 MG Tabs tablet  Commonly known as:  VIMPAT  150mg  BID on M-W-F (dialysis days); 100mg  BID on Tue-Thu-Sat-Sun (non-dialysis days)     multivitamin Tabs tablet  Take 1 tablet by  mouth at bedtime.     pantoprazole 40 MG tablet  Commonly known as:  PROTONIX  Take 1 tablet (40 mg total) by mouth 2 (two) times daily.     sennosides-docusate sodium 8.6-50 MG tablet  Commonly known as:  SENOKOT-S  Take 1 tablet by mouth every evening.           Follow-up Information    Follow up with [facility].   Why:  Home Health Serviecs to be  resumed   Contact information:   [address] [place] [address] [phone]       Follow up with [name], [name], DO. Schedule an appointment as soon as possible for a visit in 2 weeks.   Specialty:  Geriatric Medicine   Contact information:   [address] [place] [address] [phone]      Allergies  Allergen Reactions  . Aspirin Other (See Comments)    Bleeding   . Lactose Intolerance (Gi) Diarrhea    Consultations:    Procedures/Studies: Ct Head Wo Contrast  03/04/2016  CLINICAL DATA:  Altered mental status and fever. EXAM: CT HEAD WITHOUT CONTRAST TECHNIQUE: Contiguous axial images were obtained from the base of the skull through the vertex without intravenous contrast. COMPARISON:  10/23/2015 FINDINGS: There is no intracranial hemorrhage, mass or evidence of acute infarction. There is moderately severe generalized atrophy. There is extensive chronic microvascular ischemic change. There is no significant extra-axial fluid collection. No acute intracranial findings are evident. The calvarium and skullbase are intact. The visible paranasal sinuses and orbits are unremarkable. IMPRESSION: Moderately severe generalized atrophy and chronic white matter hypodensities which likely represent small vessel ischemic disease. No acute intracranial findings. Electronically Signed   By: Ellery Plunk M.D.   On: 03/04/2016 06:53   Dg Chest Port 1 View  03/04/2016  CLINICAL DATA:  Altered mental status. EXAM: PORTABLE CHEST 1 VIEW COMPARISON:  12/13/2015 FINDINGS: Intact transvenous cardiac leads.  Right jugular dual-lumen central line extending into the low SVC. The lungs are clear. The pulmonary vasculature is normal. No large effusions. IMPRESSION: No active disease. Electronically Signed   By: Ellery Plunk M.D.   On: 03/04/2016 01:45     Subjective: - no chest pain, shortness of breath, no abdominal pain, nausea or vomiting.  - at baseline per wife  Discharge Exam: Filed Vitals:   03/07/16 0418 03/07/16 1000  BP: 140/68 160/71  Pulse: 68 71  Temp: 98.5 F (36.9 C) 97.9 F (36.6 C)  Resp: 17 18   Filed Vitals:   03/06/16 1710 03/06/16 2109 03/07/16 0418 03/07/16 1000  BP: 151/63 170/66 140/68 160/71  Pulse: 66 64 68 71  Temp: 98 F (36.7 C) 97.9 F (36.6 C) 98.5 F (36.9 C) 97.9 F (36.6 C)  TempSrc: Oral Oral  Oral  Resp: 18 17 17 18   Height:      Weight:  SpO2: 99% 98% 96% 100%    General: Pt is alert, awake, not in acute distress Cardiovascular: RRR, S1/S2 +, no rubs, no gallops Respiratory: CTA bilaterally, no wheezing, no rhonchi Abdominal: Soft, NT, ND, bowel sounds + Extremities: no edema, no cyanosis    The results of significant diagnostics from this hospitalization (including imaging, microbiology, ancillary and laboratory) are listed below for reference.     Microbiology: Recent Results (from the past 240 hour(s))  Urine culture     Status: None   Collection Time: 03/04/16  2:00 AM  Result Value Ref Range Status   Specimen Description URINE, CATHETERIZED  Final   Special Requests NONE  Final   Culture NO GROWTH  Final   Report Status 03/05/2016 FINAL  Final  Blood Culture (routine x 2)     Status: None (Preliminary result)   Collection Time: 03/04/16  2:02 AM  Result Value Ref Range Status   Specimen Description BLOOD RIGHT HAND  Final   Special Requests IN PEDIATRIC BOTTLE  Final   Culture NO GROWTH 2 DAYS  Final   Report Status PENDING  Incomplete  Blood Culture (routine x 2)     Status: Abnormal   Collection Time:  03/04/16  2:20 AM  Result Value Ref Range Status   Specimen Description BLOOD RIGHT ARM  Final   Special Requests BOTTLES DRAWN AEROBIC AND ANAEROBIC  Final   Culture  Setup Time   Final    GRAM POSITIVE RODS AEROBIC BOTTLE ONLY Organism ID to follow CRITICAL RESULT CALLED TO, READ BACK BY AND VERIFIED WITH: Joette Catching PHARMD 2020 03/05/16 A BROWNING    Culture (A)  Final    DIPHTHEROIDS(CORYNEBACTERIUM SPECIES) Standardized susceptibility testing for this organism is not available.    Report Status 03/07/2016 FINAL  Final  Blood Culture ID Panel (Reflexed)     Status: None   Collection Time: 03/04/16  2:20 AM  Result Value Ref Range Status   Enterococcus species NOT DETECTED NOT DETECTED Final   Vancomycin resistance NOT DETECTED NOT DETECTED Final   Listeria monocytogenes NOT DETECTED NOT DETECTED Final   Staphylococcus species NOT DETECTED NOT DETECTED Final   Staphylococcus aureus NOT DETECTED NOT DETECTED Final   Methicillin resistance NOT DETECTED NOT DETECTED Final   Streptococcus species NOT DETECTED NOT DETECTED Final   Streptococcus agalactiae NOT DETECTED NOT DETECTED Final   Streptococcus pneumoniae NOT DETECTED NOT DETECTED Final   Streptococcus pyogenes NOT DETECTED NOT DETECTED Final   Acinetobacter baumannii NOT DETECTED NOT DETECTED Final   Enterobacteriaceae species NOT DETECTED NOT DETECTED Final   Enterobacter cloacae complex NOT DETECTED NOT DETECTED Final   Escherichia coli NOT DETECTED NOT DETECTED Final   Klebsiella oxytoca NOT DETECTED NOT DETECTED Final   Klebsiella pneumoniae NOT DETECTED NOT DETECTED Final   Proteus species NOT DETECTED NOT DETECTED Final   Serratia marcescens NOT DETECTED NOT DETECTED Final   Carbapenem resistance NOT DETECTED NOT DETECTED Final   Haemophilus influenzae NOT DETECTED NOT DETECTED Final   Neisseria meningitidis NOT DETECTED NOT DETECTED Final   Pseudomonas aeruginosa NOT DETECTED NOT DETECTED Final   Candida  albicans NOT DETECTED NOT DETECTED Final   Candida glabrata NOT DETECTED NOT DETECTED Final   Candida krusei NOT DETECTED NOT DETECTED Final   Candida parapsilosis NOT DETECTED NOT DETECTED Final   Candida tropicalis NOT DETECTED NOT DETECTED Final  MRSA PCR Screening     Status: None   Collection Time: 03/04/16  6:44 AM  Result Value Ref Range Status   MRSA by PCR NEGATIVE NEGATIVE Final    Comment:        The GeneXpert MRSA Assay (FDA approved for NASAL specimens only), is one component of a comprehensive MRSA colonization surveillance program. It is not intended to diagnose MRSA infection nor to guide or monitor treatment for MRSA infections.      Labs: BNP (last 3 results) No results for input(s): BNP in the last 8760 hours. Basic Metabolic Panel:  Recent Labs Lab 03/04/16 0202 03/05/16 0608  NA 136 133*  K 3.9 4.1  CL 100* 99*  CO2 26 23  GLUCOSE 217* 125*  BUN 35* 51*  CREATININE 6.01* 7.71*  CALCIUM 9.4 9.2   Liver Function Tests:  Recent Labs Lab 03/04/16 0202  AST 37  ALT 25  ALKPHOS 72  BILITOT 0.5  PROT 7.0  ALBUMIN 3.5   No results for input(s): LIPASE, AMYLASE in the last 168 hours. No results for input(s): AMMONIA in the last 168 hours. CBC:  Recent Labs Lab 03/04/16 0202 03/04/16 0646 03/05/16 0608  WBC 2.5*  --  3.5*  NEUTROABS 1.6*  --   --   HGB 12.9*  --  11.4*  HCT 39.2  --  35.1*  MCV 98.5  --  98.0  PLT 57* 53* 64*   Cardiac Enzymes: No results for input(s): CKTOTAL, CKMB, CKMBINDEX, TROPONINI in the last 168 hours. BNP: Invalid input(s): POCBNP CBG:  Recent Labs Lab 03/06/16 0743 03/06/16 1118 03/06/16 1709 03/06/16 2144 03/07/16 1106  GLUCAP 163* 154* 161* 140* 183*   D-Dimer No results for input(s): DDIMER in the last 72 hours. Hgb A1c No results for input(s): HGBA1C in the last 72 hours. Lipid Profile No results for input(s): CHOL, HDL, LDLCALC, TRIG, CHOLHDL, LDLDIRECT in the last 72 hours. Thyroid  function studies No results for input(s): TSH, T4TOTAL, T3FREE, THYROIDAB in the last 72 hours.  Invalid input(s): FREET3 Anemia work up No results for input(s): VITAMINB12, FOLATE, FERRITIN, TIBC, IRON, RETICCTPCT in the last 72 hours. Urinalysis    Component Value Date/Time   COLORURINE YELLOW 03/04/2016 0200   APPEARANCEUR CLEAR 03/04/2016 0200   LABSPEC 1.013 03/04/2016 0200   PHURINE 7.5 03/04/2016 0200   GLUCOSEU 250* 03/04/2016 0200   HGBUR TRACE* 03/04/2016 0200   BILIRUBINUR NEGATIVE 03/04/2016 0200   KETONESUR NEGATIVE 03/04/2016 0200   PROTEINUR 100* 03/04/2016 0200   UROBILINOGEN 0.2 06/20/2015 0845   NITRITE NEGATIVE 03/04/2016 0200   LEUKOCYTESUR NEGATIVE 03/04/2016 0200   Sepsis Labs Invalid input(s): PROCALCITONIN,  WBC,  LACTICIDVEN Microbiology Recent Results (from the past 240 hour(s))  Urine culture     Status: None   Collection Time: 03/04/16  2:00 AM  Result Value Ref Range Status   Specimen Description URINE, CATHETERIZED  Final   Special Requests NONE  Final   Culture NO GROWTH  Final   Report Status 03/05/2016 FINAL  Final  Blood Culture (routine x 2)     Status: None (Preliminary result)   Collection Time: 03/04/16  2:02 AM  Result Value Ref Range Status   Specimen Description BLOOD RIGHT HAND  Final   Special Requests IN PEDIATRIC BOTTLE  Final   Culture NO GROWTH 2 DAYS  Final   Report Status PENDING  Incomplete  Blood Culture (routine x 2)     Status: Abnormal   Collection Time: 03/04/16  2:20 AM  Result Value Ref Range Status   Specimen Description BLOOD RIGHT ARM  Final   Special Requests BOTTLES DRAWN AEROBIC AND ANAEROBIC  Final   Culture  Setup Time   Final    GRAM POSITIVE RODS AEROBIC BOTTLE ONLY Organism ID to follow CRITICAL RESULT CALLED TO, READ BACK BY AND VERIFIED WITH: Joette Catching PHARMD 2020 03/05/16 A BROWNING    Culture (A)  Final    DIPHTHEROIDS(CORYNEBACTERIUM SPECIES) Standardized susceptibility testing for this  organism is not available.    Report Status 03/07/2016 FINAL  Final  Blood Culture ID Panel (Reflexed)     Status: None   Collection Time: 03/04/16  2:20 AM  Result Value Ref Range Status   Enterococcus species NOT DETECTED NOT DETECTED Final   Vancomycin resistance NOT DETECTED NOT DETECTED Final   Listeria monocytogenes NOT DETECTED NOT DETECTED Final   Staphylococcus species NOT DETECTED NOT DETECTED Final   Staphylococcus aureus NOT DETECTED NOT DETECTED Final   Methicillin resistance NOT DETECTED NOT DETECTED Final   Streptococcus species NOT DETECTED NOT DETECTED Final   Streptococcus agalactiae NOT DETECTED NOT DETECTED Final   Streptococcus pneumoniae NOT DETECTED NOT DETECTED Final   Streptococcus pyogenes NOT DETECTED NOT DETECTED Final   Acinetobacter baumannii NOT DETECTED NOT DETECTED Final   Enterobacteriaceae species NOT DETECTED NOT DETECTED Final   Enterobacter cloacae complex NOT DETECTED NOT DETECTED Final   Escherichia coli NOT DETECTED NOT DETECTED Final   Klebsiella oxytoca NOT DETECTED NOT DETECTED Final   Klebsiella pneumoniae NOT DETECTED NOT DETECTED Final   Proteus species NOT DETECTED NOT DETECTED Final   Serratia marcescens NOT DETECTED NOT DETECTED Final   Carbapenem resistance NOT DETECTED NOT DETECTED Final   Haemophilus influenzae NOT DETECTED NOT DETECTED Final   Neisseria meningitidis NOT DETECTED NOT DETECTED Final   Pseudomonas aeruginosa NOT DETECTED NOT DETECTED Final   Candida albicans NOT DETECTED NOT DETECTED Final   Candida glabrata NOT DETECTED NOT DETECTED Final   Candida krusei NOT DETECTED NOT DETECTED Final   Candida parapsilosis NOT DETECTED NOT DETECTED Final   Candida tropicalis NOT DETECTED NOT DETECTED Final  MRSA PCR Screening     Status: None   Collection Time: 03/04/16  6:44 AM  Result Value Ref Range Status   MRSA by PCR NEGATIVE NEGATIVE Final    Comment:        The GeneXpert MRSA Assay (FDA approved for NASAL  specimens only), is one component of a comprehensive MRSA colonization surveillance program. It is not intended to diagnose MRSA infection nor to guide or monitor treatment for MRSA infections.      Time coordinating discharge: Over 30 minutes  SIGNED:  [name], MD  [facility] 03/07/2016, 4:27 PM Pager [phone]  If 7PM-7AM, please contact night-coverage [email or web address] Password [email or web address]

## 2016-03-07 NOTE — Discharge Instructions (Signed)
Follow with [name], [name], DO in 5-7 days  Please get a complete blood count and chemistry panel checked by your Primary MD at your next visit, and again as instructed by your Primary MD. Please get your medications reviewed and adjusted by your Primary MD.  Please request your Primary MD to go over all Hospital Tests and Procedure/Radiological results at the follow up, please get all Hospital records sent to your Prim MD by signing hospital release before you go home.  If you had Pneumonia of Lung problems at the Hospital: Please get a 2 view Chest X ray done in 6-8 weeks after hospital discharge or sooner if instructed by your Primary MD.  If you have Congestive Heart Failure: Please call your Cardiologist or Primary MD anytime you have any of the following symptoms:  1) 3 pound weight gain in 24 hours or 5 pounds in 1 week  2) shortness of breath, with or without a dry hacking cough  3) swelling in the hands, feet or stomach  4) if you have to sleep on extra pillows at night in order to breathe  Follow cardiac low salt diet and 1.5 lit/day fluid restriction.  If you have diabetes Accuchecks 4 times/day, Once in AM empty stomach and then before each meal. Log in all results and show them to your primary doctor at your next visit. If any glucose reading is under 80 or above 300 call your primary MD immediately.  If you have Seizure/Convulsions/Epilepsy: Please do not drive, operate heavy machinery, participate in activities at heights or participate in high speed sports until you have seen by Primary MD or a Neurologist and advised to do so again.  If you had Gastrointestinal Bleeding: Please ask your Primary MD to check a complete blood count within one week of discharge or at your next visit. Your endoscopic/colonoscopic biopsies that are pending at the time of discharge, will also need to followed by your Primary MD.  Get Medicines reviewed and adjusted. Please take all your  medications with you for your next visit with your Primary MD  Please request your Primary MD to go over all hospital tests and procedure/radiological results at the follow up, please ask your Primary MD to get all Hospital records sent to his/her office.  If you experience worsening of your admission symptoms, develop shortness of breath, life threatening emergency, suicidal or homicidal thoughts you must seek medical attention immediately by calling 911 or calling your MD immediately  if symptoms less severe.  You must read complete instructions/literature along with all the possible adverse reactions/side effects for all the Medicines you take and that have been prescribed to you. Take any new Medicines after you have completely understood and accpet all the possible adverse reactions/side effects.   Do not drive or operate heavy machinery when taking Pain medications.   Do not take more than prescribed Pain, Sleep and Anxiety Medications  Special Instructions: If you have smoked or chewed Tobacco  in the last 2 yrs please stop smoking, stop any regular Alcohol  and or any Recreational drug use.  Wear Seat belts while driving.  Please note You were cared for by a hospitalist during your hospital stay. If you have any questions about your discharge medications or the care you received while you were in the hospital after you are discharged, you can call the unit and asked to speak with the hospitalist on call if the hospitalist that took care of you is not available. Once  you are discharged, your primary care physician will handle any further medical issues. Please note that NO REFILLS for any discharge medications will be authorized once you are discharged, as it is imperative that you return to your primary care physician (or establish a relationship with a primary care physician if you do not have one) for your aftercare needs so that they can reassess your need for medications and monitor your  lab values.  You can reach the hospitalist office at phone [phone] or fax [phone]   If you do not have a primary care physician, you can call [phone] for a physician referral.  Activity: As tolerated with Full fall precautions use walker/cane & assistance as needed  Diet: renal  Disposition Home

## 2016-03-08 DIAGNOSIS — N186 End stage renal disease: Secondary | ICD-10-CM | POA: Diagnosis not present

## 2016-03-08 DIAGNOSIS — D631 Anemia in chronic kidney disease: Secondary | ICD-10-CM | POA: Diagnosis not present

## 2016-03-08 DIAGNOSIS — A4902 Methicillin resistant Staphylococcus aureus infection, unspecified site: Secondary | ICD-10-CM | POA: Diagnosis not present

## 2016-03-08 DIAGNOSIS — E1129 Type 2 diabetes mellitus with other diabetic kidney complication: Secondary | ICD-10-CM | POA: Diagnosis not present

## 2016-03-08 DIAGNOSIS — D509 Iron deficiency anemia, unspecified: Secondary | ICD-10-CM | POA: Diagnosis not present

## 2016-03-08 DIAGNOSIS — Z452 Encounter for adjustment and management of vascular access device: Secondary | ICD-10-CM | POA: Diagnosis not present

## 2016-03-08 DIAGNOSIS — D689 Coagulation defect, unspecified: Secondary | ICD-10-CM | POA: Diagnosis not present

## 2016-03-08 LAB — HEPATITIS B SURFACE ANTIGEN: HEP B S AG: NEGATIVE

## 2016-03-09 LAB — CULTURE, BLOOD (ROUTINE X 2): Culture: NO GROWTH

## 2016-03-10 DIAGNOSIS — S065X0S Traumatic subdural hemorrhage without loss of consciousness, sequela: Secondary | ICD-10-CM | POA: Diagnosis not present

## 2016-03-10 DIAGNOSIS — Z452 Encounter for adjustment and management of vascular access device: Secondary | ICD-10-CM | POA: Diagnosis not present

## 2016-03-10 DIAGNOSIS — A4902 Methicillin resistant Staphylococcus aureus infection, unspecified site: Secondary | ICD-10-CM | POA: Diagnosis not present

## 2016-03-10 DIAGNOSIS — D509 Iron deficiency anemia, unspecified: Secondary | ICD-10-CM | POA: Diagnosis not present

## 2016-03-10 DIAGNOSIS — N186 End stage renal disease: Secondary | ICD-10-CM | POA: Diagnosis not present

## 2016-03-10 DIAGNOSIS — E1122 Type 2 diabetes mellitus with diabetic chronic kidney disease: Secondary | ICD-10-CM | POA: Diagnosis not present

## 2016-03-10 DIAGNOSIS — I5032 Chronic diastolic (congestive) heart failure: Secondary | ICD-10-CM | POA: Diagnosis not present

## 2016-03-10 DIAGNOSIS — I110 Hypertensive heart disease with heart failure: Secondary | ICD-10-CM | POA: Diagnosis not present

## 2016-03-10 DIAGNOSIS — I272 Other secondary pulmonary hypertension: Secondary | ICD-10-CM | POA: Diagnosis not present

## 2016-03-10 DIAGNOSIS — E1129 Type 2 diabetes mellitus with other diabetic kidney complication: Secondary | ICD-10-CM | POA: Diagnosis not present

## 2016-03-10 DIAGNOSIS — D689 Coagulation defect, unspecified: Secondary | ICD-10-CM | POA: Diagnosis not present

## 2016-03-10 DIAGNOSIS — M5136 Other intervertebral disc degeneration, lumbar region: Secondary | ICD-10-CM | POA: Diagnosis not present

## 2016-03-10 DIAGNOSIS — Z79899 Other long term (current) drug therapy: Secondary | ICD-10-CM | POA: Diagnosis not present

## 2016-03-10 DIAGNOSIS — F0390 Unspecified dementia without behavioral disturbance: Secondary | ICD-10-CM | POA: Diagnosis not present

## 2016-03-10 DIAGNOSIS — M1991 Primary osteoarthritis, unspecified site: Secondary | ICD-10-CM | POA: Diagnosis not present

## 2016-03-10 DIAGNOSIS — G9340 Encephalopathy, unspecified: Secondary | ICD-10-CM | POA: Diagnosis not present

## 2016-03-10 DIAGNOSIS — Z794 Long term (current) use of insulin: Secondary | ICD-10-CM | POA: Diagnosis not present

## 2016-03-10 DIAGNOSIS — D631 Anemia in chronic kidney disease: Secondary | ICD-10-CM | POA: Diagnosis not present

## 2016-03-10 DIAGNOSIS — G40909 Epilepsy, unspecified, not intractable, without status epilepticus: Secondary | ICD-10-CM | POA: Diagnosis not present

## 2016-03-11 ENCOUNTER — Ambulatory Visit (INDEPENDENT_AMBULATORY_CARE_PROVIDER_SITE_OTHER)
Admission: RE | Admit: 2016-03-11 | Discharge: 2016-03-11 | Disposition: A | Discharge status: Home / Self Care | Payer: Medicare Other | Source: Ambulatory Visit | Attending: Vascular Surgery | Admitting: Vascular Surgery

## 2016-03-11 ENCOUNTER — Encounter: Payer: Self-pay | Admitting: Vascular Surgery

## 2016-03-11 ENCOUNTER — Ambulatory Visit (INDEPENDENT_AMBULATORY_CARE_PROVIDER_SITE_OTHER): Payer: Self-pay | Admitting: Vascular Surgery

## 2016-03-11 VITALS — BP 156/77 | HR 74 | Temp 97.5°F | Ht 72.0 in | Wt 191.0 lb

## 2016-03-11 DIAGNOSIS — Z992 Dependence on renal dialysis: Secondary | ICD-10-CM

## 2016-03-11 DIAGNOSIS — N186 End stage renal disease: Secondary | ICD-10-CM | POA: Diagnosis not present

## 2016-03-11 DIAGNOSIS — Z01810 Encounter for preprocedural cardiovascular examination: Secondary | ICD-10-CM | POA: Diagnosis not present

## 2016-03-11 NOTE — Progress Notes (Signed)
Referring Physician: Kermit Balo, DO  Patient name: [name] MRN: [id number] DOB: Nov 25, 1931 Sex: male  REASON FOR CONSULT: New hemodialysis access  HPI: [name] is a 82 y.o. male,  referred for evaluation for new hemodialysis access. The patient previously has had a right upper arm AV fistula which is occluded. He also had a left upper arm graft which is occluded. Recent central venogram showed the left central veins were occluded. He currently is dialyzing via a right-sided catheter. His dialysis today is Monday Wednesday Friday. Other medical problems include diabetes, hypertension, sleep apnea all of which are currently stable.  Past Medical History  Diagnosis Date  . Hypertension   . Diabetes mellitus without complication (HCC)   . Arthritis   . History of blood transfusion   . GI bleed   . Seizure (HCC)   . Subdural hematoma (HCC) 05/2015    from fall  . Sleep apnea     wears CPAP n/c  . Pulmonary hypertension (HCC)     severe pulmonary HTN by 01/14/13 echo  . ESRD (end stage renal disease) on dialysis [facility])     "MWF" (03/04/2016)   Past Surgical History  Procedure Laterality Date  . Esophagogastroduodenoscopy  10/03/2012    Procedure: ESOPHAGOGASTRODUODENOSCOPY (EGD);  Surgeon: Theda Belfast, MD;  Location: [facility] ENDOSCOPY;  Service: Endoscopy;  Laterality: N/A;  . Colonoscopy  10/04/2012    Procedure: COLONOSCOPY;  Surgeon: Theda Belfast, MD;  Location: [facility] ENDOSCOPY;  Service: Endoscopy;  Laterality: N/A;  . Colonoscopy with esophagogastroduodenoscopy (egd) Left 11/30/2012    Procedure: COLONOSCOPY WITH ESOPHAGOGASTRODUODENOSCOPY (EGD);  Surgeon: Theda Belfast, MD;  Location: [facility] ENDOSCOPY;  Service: Endoscopy;  Laterality: Left;  . Spine surgery  2004    back fusion/ [facility] Penumalli,MD  . Retinal laser procedure Right 04/2015  . Av fistula placement Left 09/09/2015    Procedure: INSERTION OF ARTERIOVENOUS  GORE-TEX GRAFT Left  ARM;  Surgeon: Sherren Kerns, MD;   Location: [facility] OR;  Service: Vascular;  Laterality: Left;  . Insertion of dialysis catheter Right 11/27/2015    Procedure: INSERTION OF DIALYSIS CATHETER - Right Internal Jugular Placement;  Surgeon: Chuck Hint, MD;  Location: [facility] OR;  Service: Vascular;  Laterality: Right;  . Ep implantable device N/A 12/12/2015    Procedure: Pacemaker Implant;  Surgeon: Will Jorja Loa, MD;  Location: [facility] INVASIVE CV LAB;  Service: Cardiovascular;  Laterality: N/A;  . Peripheral vascular catheterization Left 01/27/2016    Procedure: Fistulagram;  Surgeon: Nada Libman, MD;  Location: [facility] INVASIVE CV LAB;  Service: Cardiovascular;  Laterality: Left;  upper arm  . Ligation arteriovenous gortex graft Left 01/27/2016    Procedure: LEFT LIGATION ARTERIOVENOUS GORTEX GRAFT;  Surgeon: Chuck Hint, MD;  Location: [facility] OR;  Service: Vascular;  Laterality: Left;    Family History  Problem Relation Age of Onset  . Sudden death Mother   . Sudden death Father     SOCIAL HISTORY: Social History   Social History  . Marital Status: Married    Spouse Name: patricia  . Number of Children: 2  . Years of Education: PHD   Occupational History  . Retired    Social History Main Topics  . Smoking status: Never Smoker   . Smokeless tobacco: Never Used  . Alcohol Use: No  . Drug Use: No  . Sexual Activity: No   Other Topics Concern  . Not on file   Social History Narrative  Patient currently in [facility] rehab.   Caffeine Use: 1-2 cups occasionally    Allergies  Allergen Reactions  . Aspirin Other (See Comments)    Bleeding   . Lactose Intolerance (Gi) Diarrhea    Current Outpatient Prescriptions  Medication Sig Dispense Refill  . amLODipine (NORVASC) 5 MG tablet Take 1 tablet (5 mg total) by mouth at bedtime. 30 tablet 5  . calcitRIOL (ROCALTROL) 0.5 MCG capsule Take 2 capsules (1 mcg total) by mouth every Monday, Wednesday, and Friday with hemodialysis. 24 capsule 5   . calcium acetate (PHOSLO) 667 MG capsule Take 3 capsules by mouth three times a day 180 capsule 5  . divalproex (DEPAKOTE ER) 250 MG 24 hr tablet Take 1 tablet (250 mg total) by mouth every 12 (twelve) hours. 60 tablet 5  . fexofenadine (ALLEGRA) 180 MG tablet Take 1 tablet (180 mg total) by mouth daily as needed for allergies or rhinitis. 30 tablet 0  . folic acid (FOLVITE) 1 MG tablet Take 1 tablet (1 mg total) by mouth daily. 30 tablet 5  . gabapentin (NEURONTIN) 100 MG capsule Take 1 capsule (100 mg total) by mouth 3 (three) times daily. 90 capsule 5  . glucose blood (ACCU-CHEK AVIVA PLUS) test strip 1 each by Other route 3 (three) times daily. Use as instructed 100 each 3  . HUMALOG KWIKPEN 100 UNIT/ML KiwkPen Inject 5 Units into the skin See admin instructions. Per sliding scale  0  . Insulin Pen Needle 31G X 8 MM MISC Use as directed with insulin pens 100 each 5  . lacosamide (VIMPAT) 50 MG TABS tablet 150mg  BID on M-W-F (dialysis days); 100mg  BID on Tue-Thu-Sat-Sun (non-dialysis days) 90 tablet 5  . multivitamin (RENA-VIT) TABS tablet Take 1 tablet by mouth at bedtime. 30 tablet 5  . pantoprazole (PROTONIX) 40 MG tablet Take 1 tablet (40 mg total) by mouth 2 (two) times daily. 60 tablet 5  . sennosides-docusate sodium (SENOKOT-S) 8.6-50 MG tablet Take 1 tablet by mouth every evening. 30 tablet 5   No current facility-administered medications for this visit.   Facility-Administered Medications Ordered in Other Visits  Medication Dose Route Frequency Provider Last Rate Last Dose  . Chlorhexidine Gluconate Cloth 2 % PADS 6 each  6 each Topical Once , MD        ROS:   General:  No weight loss, Fever, chills  HEENT: No recent headaches, no nasal bleeding, no visual changes, no sore throat  Neurologic: No dizziness, blackouts, seizures. No recent symptoms of stroke or mini- stroke. No recent episodes of slurred speech, or temporary blindness.  Cardiac: No recent  episodes of chest pain/pressure, no shortness of breath at rest.  + shortness of breath with exertion.  Denies history of atrial fibrillation or irregular heartbeat  Vascular: No history of rest pain in feet.  No history of claudication.  No history of non-healing ulcer, No history of DVT   Pulmonary: No home oxygen, no productive cough, no hemoptysis,  No asthma or wheezing  Musculoskeletal:  [x ] Arthritis, [ ]  Low back pain,  [ ]  Joint pain  Hematologic:No history of hypercoagulable state.  No history of easy bleeding.  No history of anemia  Gastrointestinal: No hematochezia or melena,  No gastroesophageal reflux, no trouble swallowing  Urinary: [ ]  chronic Kidney disease, [x ] on HD - [x ] MWF or [ ]  TTHS, [ ]  Burning with urination, [ ]  Frequent urination, [ ]  Difficulty urinating;  Skin: No rashes  Psychological: No history of anxiety,  No history of depression   Physical Examination  Filed Vitals:   03/11/16 1401  BP: 156/77  Pulse: 74  Temp: 97.5 F (36.4 C)  TempSrc: Oral  Height: 6' (1.829 m)  Weight: 191 lb (86.637 kg)  SpO2: 98%    Body mass index is 25.9 kg/(m^2).  General:  Alert and oriented, no acute distress HEENT: Normal Neck: Right-sided dialysis catheter  Pulmonary: Clear to auscultation bilaterally Cardiac: Regular Rate and Rhythm  Skin: No rash, scar right dorsal foot from prior skin graft Extremity Pulses:  2+ radial, brachial, femoral, 2+ right dorsalis pedis absent left dorsalis pedis, absent posterior tibial pulses bilaterally Musculoskeletal: No deformity or edema  Neurologic: Upper and lower extremity motor 5/5 and symmetric  DATA:  Patient had bilateral ABIs performed today. Vessels were noncompressible. Toe pressure on the right was 113, left was 119 biphasic waveforms bilaterally.  I reviewed the patient's recent left central venogram which shows central vein occlusion.  ASSESSMENT:  Patient with known left central venous occlusion. He  has only had a fistula in the right arm which is chronically occluded but we do not know about patency of the right central veins.   PLAN:  Right central venogram. If the veins are patent on the right side plan would be for right upper arm AV graft. If central veins are occluded on the right side we will plan for a right thigh AV graft. Risks benefits possible palpitations and procedure details were discussed with the patient and his daughter-in-law today. They understand and agree to proceed. Central venogram is scheduled for July 20. Follow-up plan for a new access will be made after the central venogram.   Fabienne Bruns, MD [facility] Office: [phone] Pager: [phone]

## 2016-03-12 ENCOUNTER — Other Ambulatory Visit: Payer: Self-pay

## 2016-03-12 DIAGNOSIS — D509 Iron deficiency anemia, unspecified: Secondary | ICD-10-CM | POA: Diagnosis not present

## 2016-03-12 DIAGNOSIS — N186 End stage renal disease: Secondary | ICD-10-CM | POA: Diagnosis not present

## 2016-03-12 DIAGNOSIS — D689 Coagulation defect, unspecified: Secondary | ICD-10-CM | POA: Diagnosis not present

## 2016-03-12 DIAGNOSIS — Z452 Encounter for adjustment and management of vascular access device: Secondary | ICD-10-CM | POA: Diagnosis not present

## 2016-03-12 DIAGNOSIS — A4902 Methicillin resistant Staphylococcus aureus infection, unspecified site: Secondary | ICD-10-CM | POA: Diagnosis not present

## 2016-03-12 DIAGNOSIS — E1129 Type 2 diabetes mellitus with other diabetic kidney complication: Secondary | ICD-10-CM | POA: Diagnosis not present

## 2016-03-12 DIAGNOSIS — D631 Anemia in chronic kidney disease: Secondary | ICD-10-CM | POA: Diagnosis not present

## 2016-03-15 DIAGNOSIS — R404 Transient alteration of awareness: Secondary | ICD-10-CM | POA: Diagnosis not present

## 2016-03-15 DIAGNOSIS — D631 Anemia in chronic kidney disease: Secondary | ICD-10-CM | POA: Diagnosis not present

## 2016-03-15 DIAGNOSIS — E1129 Type 2 diabetes mellitus with other diabetic kidney complication: Secondary | ICD-10-CM | POA: Diagnosis not present

## 2016-03-15 DIAGNOSIS — D689 Coagulation defect, unspecified: Secondary | ICD-10-CM | POA: Diagnosis not present

## 2016-03-15 DIAGNOSIS — D509 Iron deficiency anemia, unspecified: Secondary | ICD-10-CM | POA: Diagnosis not present

## 2016-03-15 DIAGNOSIS — R55 Syncope and collapse: Secondary | ICD-10-CM | POA: Diagnosis not present

## 2016-03-15 DIAGNOSIS — Z452 Encounter for adjustment and management of vascular access device: Secondary | ICD-10-CM | POA: Diagnosis not present

## 2016-03-15 DIAGNOSIS — N186 End stage renal disease: Secondary | ICD-10-CM | POA: Diagnosis not present

## 2016-03-15 DIAGNOSIS — A4902 Methicillin resistant Staphylococcus aureus infection, unspecified site: Secondary | ICD-10-CM | POA: Diagnosis not present

## 2016-03-16 ENCOUNTER — Emergency Department (HOSPITAL_COMMUNITY): Payer: Medicare Other

## 2016-03-16 ENCOUNTER — Inpatient Hospital Stay (HOSPITAL_COMMUNITY)
Admission: EM | Admit: 2016-03-16 | Discharge: 2016-03-17 | DRG: 100 | Disposition: A | Discharge status: Home / Self Care | Payer: Medicare Other | Attending: Internal Medicine | Admitting: Internal Medicine

## 2016-03-16 ENCOUNTER — Encounter: Payer: Medicare Other | Admitting: Cardiology

## 2016-03-16 ENCOUNTER — Encounter (HOSPITAL_COMMUNITY): Payer: Self-pay | Admitting: Emergency Medicine

## 2016-03-16 DIAGNOSIS — I110 Hypertensive heart disease with heart failure: Secondary | ICD-10-CM | POA: Diagnosis not present

## 2016-03-16 DIAGNOSIS — I4581 Long QT syndrome: Secondary | ICD-10-CM | POA: Diagnosis not present

## 2016-03-16 DIAGNOSIS — G4730 Sleep apnea, unspecified: Secondary | ICD-10-CM | POA: Diagnosis not present

## 2016-03-16 DIAGNOSIS — Z950 Presence of cardiac pacemaker: Secondary | ICD-10-CM

## 2016-03-16 DIAGNOSIS — Z79899 Other long term (current) drug therapy: Secondary | ICD-10-CM | POA: Diagnosis not present

## 2016-03-16 DIAGNOSIS — N186 End stage renal disease: Secondary | ICD-10-CM | POA: Diagnosis not present

## 2016-03-16 DIAGNOSIS — M1991 Primary osteoarthritis, unspecified site: Secondary | ICD-10-CM | POA: Diagnosis not present

## 2016-03-16 DIAGNOSIS — R569 Unspecified convulsions: Secondary | ICD-10-CM

## 2016-03-16 DIAGNOSIS — S065X0S Traumatic subdural hemorrhage without loss of consciousness, sequela: Secondary | ICD-10-CM | POA: Diagnosis not present

## 2016-03-16 DIAGNOSIS — I5032 Chronic diastolic (congestive) heart failure: Secondary | ICD-10-CM | POA: Diagnosis not present

## 2016-03-16 DIAGNOSIS — R4182 Altered mental status, unspecified: Secondary | ICD-10-CM

## 2016-03-16 DIAGNOSIS — E1122 Type 2 diabetes mellitus with diabetic chronic kidney disease: Secondary | ICD-10-CM | POA: Diagnosis not present

## 2016-03-16 DIAGNOSIS — Z794 Long term (current) use of insulin: Secondary | ICD-10-CM | POA: Diagnosis not present

## 2016-03-16 DIAGNOSIS — I272 Other secondary pulmonary hypertension: Secondary | ICD-10-CM | POA: Diagnosis not present

## 2016-03-16 DIAGNOSIS — Z91011 Allergy to milk products: Secondary | ICD-10-CM

## 2016-03-16 DIAGNOSIS — I120 Hypertensive chronic kidney disease with stage 5 chronic kidney disease or end stage renal disease: Secondary | ICD-10-CM | POA: Diagnosis not present

## 2016-03-16 DIAGNOSIS — M5136 Other intervertebral disc degeneration, lumbar region: Secondary | ICD-10-CM | POA: Diagnosis not present

## 2016-03-16 DIAGNOSIS — Z981 Arthrodesis status: Secondary | ICD-10-CM

## 2016-03-16 DIAGNOSIS — G40909 Epilepsy, unspecified, not intractable, without status epilepticus: Principal | ICD-10-CM | POA: Diagnosis present

## 2016-03-16 DIAGNOSIS — R9431 Abnormal electrocardiogram [ECG] [EKG]: Secondary | ICD-10-CM

## 2016-03-16 DIAGNOSIS — Z886 Allergy status to analgesic agent status: Secondary | ICD-10-CM | POA: Diagnosis not present

## 2016-03-16 DIAGNOSIS — G9340 Encephalopathy, unspecified: Secondary | ICD-10-CM | POA: Diagnosis not present

## 2016-03-16 DIAGNOSIS — D696 Thrombocytopenia, unspecified: Secondary | ICD-10-CM | POA: Diagnosis not present

## 2016-03-16 DIAGNOSIS — Z992 Dependence on renal dialysis: Secondary | ICD-10-CM

## 2016-03-16 DIAGNOSIS — Z66 Do not resuscitate: Secondary | ICD-10-CM | POA: Diagnosis not present

## 2016-03-16 DIAGNOSIS — D61818 Other pancytopenia: Secondary | ICD-10-CM | POA: Diagnosis not present

## 2016-03-16 LAB — I-STAT TROPONIN, ED: Troponin i, poc: 0.05 ng/mL (ref 0.00–0.08)

## 2016-03-16 LAB — CBC WITH DIFFERENTIAL/PLATELET
BASOS ABS: 0 10*3/uL (ref 0.0–0.1)
Basophils Relative: 0 %
Eosinophils Absolute: 0 10*3/uL (ref 0.0–0.7)
Eosinophils Relative: 1 %
HEMATOCRIT: 36.4 % — AB (ref 39.0–52.0)
HEMOGLOBIN: 12 g/dL — AB (ref 13.0–17.0)
LYMPHS PCT: 24 %
Lymphs Abs: 0.8 10*3/uL (ref 0.7–4.0)
MCH: 32.4 pg (ref 26.0–34.0)
MCHC: 33 g/dL (ref 30.0–36.0)
MCV: 98.4 fL (ref 78.0–100.0)
MONO ABS: 0.3 10*3/uL (ref 0.1–1.0)
Monocytes Relative: 10 %
NEUTROS PCT: 65 %
Neutro Abs: 2 10*3/uL (ref 1.7–7.7)
Platelets: 54 10*3/uL — ABNORMAL LOW (ref 150–400)
RBC: 3.7 MIL/uL — AB (ref 4.22–5.81)
RDW: 16.2 % — AB (ref 11.5–15.5)
WBC: 3.1 10*3/uL — AB (ref 4.0–10.5)

## 2016-03-16 LAB — COMPREHENSIVE METABOLIC PANEL
ALBUMIN: 3.4 g/dL — AB (ref 3.5–5.0)
ALT: 22 U/L (ref 17–63)
ANION GAP: 11 (ref 5–15)
AST: 30 U/L (ref 15–41)
Alkaline Phosphatase: 68 U/L (ref 38–126)
BILIRUBIN TOTAL: 0.8 mg/dL (ref 0.3–1.2)
BUN: 50 mg/dL — ABNORMAL HIGH (ref 6–20)
CALCIUM: 9.5 mg/dL (ref 8.9–10.3)
CO2: 24 mmol/L (ref 22–32)
Chloride: 101 mmol/L (ref 101–111)
Creatinine, Ser: 7.11 mg/dL — ABNORMAL HIGH (ref 0.61–1.24)
GFR calc non Af Amer: 6 mL/min — ABNORMAL LOW (ref >60)
GFR, EST AFRICAN AMERICAN: 7 mL/min — AB (ref >60)
Glucose, Bld: 163 mg/dL — ABNORMAL HIGH (ref 65–99)
POTASSIUM: 4 mmol/L (ref 3.5–5.1)
SODIUM: 136 mmol/L (ref 135–145)
TOTAL PROTEIN: 7.2 g/dL (ref 6.5–8.1)

## 2016-03-16 LAB — GLUCOSE, CAPILLARY
GLUCOSE-CAPILLARY: 130 mg/dL — AB (ref 65–99)
GLUCOSE-CAPILLARY: 115 mg/dL — AB (ref 65–99)

## 2016-03-16 LAB — AMMONIA: AMMONIA: 36 umol/L — AB (ref 9–35)

## 2016-03-16 LAB — RAPID URINE DRUG SCREEN, HOSP PERFORMED
AMPHETAMINES: NOT DETECTED
BENZODIAZEPINES: NOT DETECTED
Barbiturates: NOT DETECTED
Cocaine: NOT DETECTED
OPIATES: NOT DETECTED
TETRAHYDROCANNABINOL: NOT DETECTED

## 2016-03-16 LAB — URINALYSIS, ROUTINE W REFLEX MICROSCOPIC
BILIRUBIN URINE: NEGATIVE
Glucose, UA: 250 mg/dL — AB
HGB URINE DIPSTICK: NEGATIVE
Ketones, ur: NEGATIVE mg/dL
Leukocytes, UA: NEGATIVE
NITRITE: NEGATIVE
PROTEIN: 100 mg/dL — AB
Specific Gravity, Urine: 1.013 (ref 1.005–1.030)
pH: 7 (ref 5.0–8.0)

## 2016-03-16 LAB — I-STAT CG4 LACTIC ACID, ED
LACTIC ACID, VENOUS: 1.2 mmol/L (ref 0.5–1.9)
LACTIC ACID, VENOUS: 0.54 mmol/L (ref 0.5–1.9)

## 2016-03-16 LAB — CK: CK TOTAL: 83 U/L (ref 49–397)

## 2016-03-16 LAB — URINE MICROSCOPIC-ADD ON

## 2016-03-16 LAB — CBG MONITORING, ED
GLUCOSE-CAPILLARY: 138 mg/dL — AB (ref 65–99)
Glucose-Capillary: 144 mg/dL — ABNORMAL HIGH (ref 65–99)

## 2016-03-16 LAB — VALPROIC ACID LEVEL: VALPROIC ACID LVL: 24 ug/mL — AB (ref 50.0–100.0)

## 2016-03-16 MED ORDER — HYDRALAZINE HCL 20 MG/ML IJ SOLN: 10.0000 mg | Freq: Three times a day (TID) | INTRAMUSCULAR | Status: DC | PRN

## 2016-03-16 MED ORDER — ACETAMINOPHEN 325 MG PO TABS: 650.0000 mg | ORAL_TABLET | Freq: Four times a day (QID) | ORAL | Status: DC | PRN

## 2016-03-16 MED ORDER — DIVALPROEX SODIUM ER 500 MG PO TB24
500.0000 mg | ORAL_TABLET | Freq: Every day | ORAL | Status: DC
  Administered 2016-03-16 – 2016-03-17 (×2): 500 mg via ORAL
  Filled 2016-03-16 (×2): qty 1

## 2016-03-16 MED ORDER — CALCITRIOL 0.5 MCG PO CAPS: 1.0000 ug | ORAL_CAPSULE | ORAL | Status: DC

## 2016-03-16 MED ORDER — INSULIN ASPART 100 UNIT/ML ~~LOC~~ SOLN
0.0000 [IU] | Freq: Three times a day (TID) | SUBCUTANEOUS | Status: DC
  Administered 2016-03-16: 1 [IU] via SUBCUTANEOUS
  Administered 2016-03-17: 2 [IU] via SUBCUTANEOUS

## 2016-03-16 MED ORDER — GABAPENTIN 100 MG PO CAPS
100.0000 mg | ORAL_CAPSULE | Freq: Three times a day (TID) | ORAL | Status: DC
  Administered 2016-03-16 – 2016-03-17 (×4): 100 mg via ORAL
  Filled 2016-03-16 (×4): qty 1

## 2016-03-16 MED ORDER — LACOSAMIDE 50 MG PO TABS: 100.0000 mg | ORAL_TABLET | ORAL | Status: DC

## 2016-03-16 MED ORDER — FOLIC ACID 1 MG PO TABS: 1.0000 mg | ORAL_TABLET | Freq: Every day | ORAL | Status: DC

## 2016-03-16 MED ORDER — CALCIUM ACETATE (PHOS BINDER) 667 MG PO CAPS
667.0000 mg | ORAL_CAPSULE | Freq: Three times a day (TID) | ORAL | Status: DC
  Administered 2016-03-17: 667 mg via ORAL
  Filled 2016-03-16 (×2): qty 1

## 2016-03-16 MED ORDER — LACOSAMIDE 50 MG PO TABS: 150.0000 mg | ORAL_TABLET | ORAL | Status: DC

## 2016-03-16 MED ORDER — FOLIC ACID 1 MG PO TABS
1.0000 mg | ORAL_TABLET | Freq: Every day | ORAL | Status: DC
  Administered 2016-03-17: 1 mg via ORAL
  Filled 2016-03-16: qty 1

## 2016-03-16 MED ORDER — SODIUM CHLORIDE 0.9 % IV SOLN
INTRAVENOUS | Status: DC
  Administered 2016-03-16: 10:00:00 via INTRAVENOUS

## 2016-03-16 MED ORDER — AMLODIPINE BESYLATE 5 MG PO TABS: 5.0000 mg | ORAL_TABLET | Freq: Every day | ORAL | Status: DC

## 2016-03-16 MED ORDER — ACETAMINOPHEN 650 MG RE SUPP: 650.0000 mg | Freq: Four times a day (QID) | RECTAL | Status: DC | PRN

## 2016-03-16 MED ORDER — CALCITRIOL 0.25 MCG PO CAPS
0.2500 ug | ORAL_CAPSULE | ORAL | Status: DC
  Administered 2016-03-17: 0.25 ug via ORAL
  Filled 2016-03-16: qty 1

## 2016-03-16 MED ORDER — RENA-VITE PO TABS: 1.0000 | ORAL_TABLET | Freq: Every day | ORAL | Status: DC

## 2016-03-16 MED ORDER — LACOSAMIDE 50 MG PO TABS
150.0000 mg | ORAL_TABLET | Freq: Two times a day (BID) | ORAL | Status: DC
  Administered 2016-03-16 – 2016-03-17 (×2): 150 mg via ORAL
  Filled 2016-03-16 (×2): qty 3

## 2016-03-16 MED ORDER — SODIUM CHLORIDE 0.9% FLUSH
3.0000 mL | Freq: Two times a day (BID) | INTRAVENOUS | Status: DC
  Administered 2016-03-16 – 2016-03-17 (×3): 3 mL via INTRAVENOUS

## 2016-03-16 MED ORDER — POLYETHYLENE GLYCOL 3350 17 G PO PACK: 17.0000 g | PACK | Freq: Every day | ORAL | Status: DC | PRN

## 2016-03-16 MED ORDER — BISACODYL 5 MG PO TBEC: 5.0000 mg | DELAYED_RELEASE_TABLET | Freq: Every day | ORAL | Status: DC | PRN

## 2016-03-16 MED ORDER — DIVALPROEX SODIUM ER 250 MG PO TB24: 250.0000 mg | ORAL_TABLET | Freq: Two times a day (BID) | ORAL | Status: DC

## 2016-03-16 NOTE — ED Provider Notes (Signed)
TIME SEEN: 1:20 AM  CHIEF COMPLAINT: Seizures and AMS  Level V Caveat due to AMS   HPI: Pt is an 82 y.o. male with PMHx of HTN, DM, pulmonary HTN, seizures, and ESRD on HD MWF, junctional bradycardia status post pacemaker placement who presents to the ED by wife with a complaint of gradually worsening, altered mental status and possible seizure that began this evening. Per wife, pt was talking with her in bed and then suddenly "stopped talking and stared off into space;" she reports it was probably a seizure. Per wife, pt has not had a seizure in a while. Pt is verbal to questions such as his name but is overall disoriented. Pt's wife denies coughing, diarrhea, vomiting and fever. She also denies a recent head injury and use of anticoagulants. Pt's wife notes pt still takes vimpat, depakote and gabapentin and has not missed any doses. She also reports  pt had dialysis yesterday; she states pt has never missed an appointment. Pt's wife notes pt has been on dialysis for ~3 years. Per wife, pt still makes urine without difficulty. Patient's wife does report that she has a hard time remembering things. When asked about what happened tonight she states "I don't really remember". He was just recently admitted to the hospital with fever without source and delirium. Has had delirium several times in the past often related to bradycardia, UTI. Patient denies that he is having any pain at this time.  ROS: Unable to perform due to AMS  PAST MEDICAL HISTORY/PAST SURGICAL HISTORY:  Past Medical History  Diagnosis Date  . Hypertension   . Diabetes mellitus without complication (HCC)   . Arthritis   . History of blood transfusion   . GI bleed   . Seizure (HCC)   . Subdural hematoma (HCC) 05/2015    from fall  . Sleep apnea     wears CPAP n/c  . Pulmonary hypertension (HCC)     severe pulmonary HTN by 01/14/13 echo  . ESRD (end stage renal disease) on dialysis [facility])     "MWF" (03/04/2016)    MEDICATIONS:   Prior to Admission medications   Medication Sig Start Date End Date Taking? Authorizing Provider  amLODipine (NORVASC) 5 MG tablet Take 1 tablet (5 mg total) by mouth at bedtime. 02/26/16   [name] L [name], DO  calcitRIOL (ROCALTROL) 0.5 MCG capsule Take 2 capsules (1 mcg total) by mouth every Monday, Wednesday, and Friday with hemodialysis. 02/26/16   [name] L [name], DO  calcium acetate (PHOSLO) 667 MG capsule Take 3 capsules by mouth three times a day 02/26/16   [name] L [name], DO  divalproex (DEPAKOTE ER) 250 MG 24 hr tablet Take 1 tablet (250 mg total) by mouth every 12 (twelve) hours. 02/26/16   [name] L [name], DO  fexofenadine (ALLEGRA) 180 MG tablet Take 1 tablet (180 mg total) by mouth daily as needed for allergies or rhinitis. 02/26/16   [name] L [name], DO  folic acid (FOLVITE) 1 MG tablet Take 1 tablet (1 mg total) by mouth daily. 02/26/16   [name] L [name], DO  gabapentin (NEURONTIN) 100 MG capsule Take 1 capsule (100 mg total) by mouth 3 (three) times daily. 02/26/16   [name] L [name], DO  glucose blood (ACCU-CHEK AVIVA PLUS) test strip 1 each by Other route 3 (three) times daily. Use as instructed 01/21/16   [name] L [name], DO  HUMALOG KWIKPEN 100 UNIT/ML KiwkPen Inject 5 Units into the skin See admin instructions. Per sliding  scale 02/19/16   Historical Provider, MD  Insulin Pen Needle 31G X 8 MM MISC Use as directed with insulin pens 03/01/16   [name] L [name], DO  lacosamide (VIMPAT) 50 MG TABS tablet 150mg  BID on M-W-F (dialysis days); 100mg  BID on Tue-Thu-Sat-Sun (non-dialysis days) 02/26/16   [name] L [name], DO  multivitamin (RENA-VIT) TABS tablet Take 1 tablet by mouth at bedtime. 02/26/16   [name] L [name], DO  pantoprazole (PROTONIX) 40 MG tablet Take 1 tablet (40 mg total) by mouth 2 (two) times daily. 02/26/16   [name] L [name], DO  sennosides-docusate sodium (SENOKOT-S) 8.6-50 MG tablet Take 1 tablet by mouth every evening. 02/26/16   [name] L [name], DO    ALLERGIES:  Allergies  Allergen  Reactions  . Aspirin Other (See Comments)    Bleeding   . Lactose Intolerance (Gi) Diarrhea    SOCIAL HISTORY:  Social History  Substance Use Topics  . Smoking status: Never Smoker   . Smokeless tobacco: Never Used  . Alcohol Use: No    FAMILY HISTORY: Family History  Problem Relation Age of Onset  . Sudden death Mother   . Sudden death Father     EXAM: BP 166/77 mmHg  Pulse 87  Temp(Src) 100 F (37.8 C) (Oral)  SpO2 98% CONSTITUTIONAL: Elderly, patient is alert but does fall asleep quickly during questioning, he is able to wake up and answer his name and states that it is 2005. Unable to tell me where he is. Appears to be postictal. Arouses to voice. HEAD: Normocephalic EYES: Conjunctivae clear, PERRL ENT: normal nose; no rhinorrhea; moist mucous membranes, no nuchal rigidity NECK: Supple, no meningismus, no LAD  CARD: RRR; S1 and S2 appreciated; no murmurs, no clicks, no rubs, no gallops; dialysis catheter noted in the right chest wall without warmth, erythema or tenderness RESP: Normal chest excursion without splinting or tachypnea; breath sounds clear and equal bilaterally; no wheezes, no rhonchi, no rales, no hypoxia or respiratory distress, speaking full sentences ABD/GI: Normal bowel sounds; non-distended; soft, non-tender, no rebound, no guarding, no peritoneal signs BACK:  The back appears normal and is non-tender to palpation, there is no CVA tenderness EXT: Normal ROM in all joints; non-tender to palpation; no edema; normal capillary refill; no cyanosis, no calf tenderness or swelling, AV fistula in the right forearm with normal thrill, 2+ radial pulses bilaterally, 2+ DP pulses bilaterally    SKIN: Normal color for age and race; warm; no rash NEURO: Moves all extremities equally, unable to answer questions or follow commands reliably at this time, appears to be postictal   MEDICAL DECISION MAKING: Patient here with possible seizure today. He appears to be altered  from his baseline. Wife is a very poor historian and is unable to tell me exactly what happened. She thinks that he was staring off into space and she thinks that this is like his prior seizures but she has not 100% sure. Was recently admitted for fever and altered mental status. Does have an oral temperature here of 100. We'll check rectal temperature, lactate, blood cultures, urine, chest x-ray. Loss obtain a head CT. He denies any pain at this time.  ED PROGRESS: 3:45 AM  Pt's labs have been unremarkable, no significant change from baseline. Lactate normal. Troponin negative. He has chronic thrombocytopenia. Electrolytes normal. Blood glucose 144. Head CT shows no acute abnormality. Chest x-ray shows no acute cardiopulmonary process. Patient is still very drowsy and is arousable to voice and will state his name  but does not answer any other questions or follow commands without falling back to sleep. If this is a postictal. It seems quite prolonged. We will obtain a catheterized urine sample.   5:45 AM  Pt does not appear to have urinary tract infection. He is still very drowsy and difficult to arouse which wife reports is not his baseline. He is normally able to ambulate, perform ADLs, carry a conversation. He has been admitted in the past for intermittent delirium. I do not feel he will be able to go home at this point given he is still not awake. This appears abnormal that his postictal period would be this long. He is not on any sedative medications per his medication list. Will obtain a urine drug screen. Depakote level is pending. Will discuss with medicine for admission for observation.   6:00 AM  D/w Dr. Toniann Fail with hospitalist service. He recommends discussing with neurology on call to see if they recommend an emergent EEG.   6:30 AM  Page placed to neurology, Dr. Roseanne Reno, for their recommendations at 6:12 am. Discussed again with Dr. Toniann Fail with hospitalist service who agrees on  admission to step down, inpatient bed. I will place holding orders and the oncoming admitting physician will see the patient. He is arousable to voice and opens his eyes and will answer his name. Otherwise he falls back asleep quickly. He does move all his extremities. When you ask if he is having any pain anywhere he states "no". His full range of motion in his neck and no nuchal rigidity. Did have a low-grade oral temperature of 100.0 by rectal temperature of 99.6. Has chronic leukopenia which is unchanged. Suspicion that this is meningitis or encephalitis from infectious etiology is very low. I do not feel he needs emergent lumbar puncture or antibiotics at this time. It appears patient has had intermittent episodes of delirium, altered mental status and has been admitted in February, March, April, June for the same. He is protecting his airway. He does not need to be intubated.  7:10 AM  Pt's Depakote level is subtherapeutic. Urine drug screen negative. Admitted to hospitalist. Awaiting neurology recommendations.  Repaged at 7:07 am.   7:35 AM  D/w Dr. Konrad Dolores with hospitalist service. They will get in touch with neurology in follow-up on their recommendations. Have discussed patient's case with hospitalist. Patient has been seen by Gunnar Fusi, nurse practitioner.  I reviewed all nursing notes, vitals, pertinent old records, EKGs, labs, imaging (as available).   EKG Interpretation  Date/Time:  Tuesday March 16 2016 00:48:17 EDT Ventricular Rate:  87 PR Interval:    QRS Duration: 89 QT Interval:  428 QTC Calculation: 515 R Axis:   24 Text Interpretation:  Sinus rhythm Prolonged PR interval Borderline T abnormalities, lateral leads Prolonged QT interval No significant change since last tracing Confirmed by Skippy Marhefka,  DO, Alverta Caccamo [phone]) on 03/16/2016 12:55:29 AM        I personally performed the services described in this documentation, which was scribed in my presence. The recorded information has  been reviewed and is accurate.    Layla Maw Tristan Bramble, DO 03/16/16 [phone]

## 2016-03-16 NOTE — Plan of Care (Signed)
82 year old male with known history of ESRD on hemodialysis and seizure disorder who was just recently discharged last week with similar presentation of confusion. Cause not clear. As per the ER physician patient responds to his name but appears drowsy. Patient is being admitted for further management of acute encephalopathy.  [name].

## 2016-03-16 NOTE — Progress Notes (Signed)
Received call from nurse earlier that patient wanted to be discharged. I have just called the room to speak with him in person. He has changed his mind and wants to stay the night. Patient feels okay, he is now concerned about going home prematurely.

## 2016-03-16 NOTE — ED Notes (Signed)
Pacemaker interrogated. 

## 2016-03-16 NOTE — ED Notes (Signed)
Pt was found by his wife "slumped over funny in bed not acting like himself."  She believes that he had a seizure as he is on medication for such.  He is postdictil but will wake up to tell us his name, unoriented to everything else.   Had dialysis on Monday

## 2016-03-16 NOTE — ED Notes (Signed)
Patient now refusing to be transported to inpatient room.  Demands his wife be called.  Wife states she will be here in 15 minutes.

## 2016-03-16 NOTE — Progress Notes (Signed)
New Admission Note: transfer from ED  Arrival Method: stretcher Mental Orientation:  Telemetry: placed Assessment: Completed Skin: clean dry intact IV:LW NS 50 Pain: none Tubes:none Safety Measures: Safety Fall Prevention Plan has been given, discussed and signed. Seizure precautions suction set up at bedside Admission: Completed Unit Orientation: Patient has been orientated to the room, unit and staff.  Family: wife at bedside  Orders have been reviewed and implemented. Will continue to monitor the patient. Call light has been placed within reach and bed alarm has been activated.   [name] BSN, RN

## 2016-03-16 NOTE — Progress Notes (Signed)
RT came to do ABG - pt refuses at this time. Pt states he does not want anymore blood work done for RT to notify MD.

## 2016-03-16 NOTE — Consult Note (Signed)
NEURO HOSPITALIST CONSULT NOTE      Reason for Consult:AMS    HPI:                                                                                                                                          [name] is an 82 y.o. male with history of hypertension, diabetes, GI bleed, sleep apnea, pulmonary hypertension, end-stage renal disease on dialysis months Monday Wednesday Friday, prior history of subdural hematoma and seizure disorder presented with altered mental status. Patient family reported that he has been having ongoing STARING spells yesterday although since he's been in the ER he seemed pretty lucid very close to his baseline. He states that he's been sick since yesterday but does not describe any major details. His medications are given by his family so he does not know the details of his medication also He endorses some generalized weakness but denies any chest pain palpitation fever or chills headache no change in vision, no change in bowel or urinary habits Estimated earlier has history of sleep apnea and is on Cpap and also on dialysis. Seizure disorder he supposed to be on Depakote extended release 250 mg twice a day and Vimpat 150 mg twice a day on the days of dialysis and 100 mg twice a day on other days   Past Medical History  Diagnosis Date  . Hypertension   . Diabetes mellitus without complication (HCC)   . Arthritis   . History of blood transfusion   . GI bleed   . Seizure (HCC)   . Subdural hematoma (HCC) 05/2015    from fall  . Sleep apnea     wears CPAP n/c  . Pulmonary hypertension (HCC)     severe pulmonary HTN by 01/14/13 echo  . ESRD (end stage renal disease) on dialysis [facility])     "MWF" (03/04/2016)    Past Surgical History  Procedure Laterality Date  . Esophagogastroduodenoscopy  10/03/2012    Procedure: ESOPHAGOGASTRODUODENOSCOPY (EGD);  Surgeon: Theda Belfast, MD;  Location[facility] ENDOSCOPY;  Service: Endoscopy;  Laterality:  N/A;  . Colonoscopy  10/04/2012    Procedure: COLONOSCOPY;  Surgeon: Theda Belfast, MD;  Location: [facility] ENDOSCOPY;  Service: Endoscopy;  Laterality: N/A;  . Colonoscopy with esophagogastroduodenoscopy (egd) Left 11/30/2012    Procedure: COLONOSCOPY WITH ESOPHAGOGASTRODUODENOSCOPY (EGD);  Surgeon: Theda Belfast, MD;  Location: [facility] ENDOSCOPY;  Service: Endoscopy;  Laterality: Left;  . Spine surgery  2004    back fusion/ [facility] Penumalli,MD  . Retinal laser procedure Right 04/2015  . Av fistula placement Left 09/09/2015    Procedure: INSERTION OF ARTERIOVENOUS  GORE-TEX GRAFT Left  ARM;  Surgeon: Sherren Kerns, MD;  Location: [facility] OR;  Service: Vascular;  Laterality: Left;  . Insertion  of dialysis catheter Right 11/27/2015    Procedure: INSERTION OF DIALYSIS CATHETER - Right Internal Jugular Placement;  Surgeon: Chuck Hint, MD;  Location: [facility] OR;  Service: Vascular;  Laterality: Right;  . Ep implantable device N/A 12/12/2015    Procedure: Pacemaker Implant;  Surgeon: Will Jorja Loa, MD;  Location: [facility] INVASIVE CV LAB;  Service: Cardiovascular;  Laterality: N/A;  . Peripheral vascular catheterization Left 01/27/2016    Procedure: Fistulagram;  Surgeon: Nada Libman, MD;  Location[facility] INVASIVE CV LAB;  Service: Cardiovascular;  Laterality: Left;  upper arm  . Ligation arteriovenous gortex graft Left 01/27/2016    Procedure: LEFT LIGATION ARTERIOVENOUS GORTEX GRAFT;  Surgeon: Chuck Hint, MD;  Location: [facility] OR;  Service: Vascular;  Laterality: Left;    Family History  Problem Relation Age of Onset  . Sudden death Mother   . Sudden death Father     Social History:  reports that he has never smoked. He has never used smokeless tobacco. He reports that he does not drink alcohol or use illicit drugs.  Allergies  Allergen Reactions  . Aspirin Other (See Comments)    Bleeding   . Lactose Intolerance (Gi) Diarrhea   Prior to Admission medications   Medication Sig Start Date  End Date Taking? Authorizing Provider  amLODipine (NORVASC) 5 MG tablet Take 1 tablet (5 mg total) by mouth at bedtime. 02/26/16  Yes [name] L [name], DO  calcitRIOL (ROCALTROL) 0.5 MCG capsule Take 2 capsules (1 mcg total) by mouth every Monday, Wednesday, and Friday with hemodialysis. 02/26/16  Yes [name] L [name], DO  calcium acetate (PHOSLO) 667 MG capsule Take 3 capsules by mouth three times a day 02/26/16  Yes [name] L [name], DO  divalproex (DEPAKOTE ER) 250 MG 24 hr tablet Take 1 tablet (250 mg total) by mouth every 12 (twelve) hours. 02/26/16  Yes [name] L [name], DO  fexofenadine (ALLEGRA) 180 MG tablet Take 1 tablet (180 mg total) by mouth daily as needed for allergies or rhinitis. 02/26/16  Yes [name] L [name], DO  folic acid (FOLVITE) 1 MG tablet Take 1 tablet (1 mg total) by mouth daily. 02/26/16  Yes [name] L [name], DO  gabapentin (NEURONTIN) 100 MG capsule Take 1 capsule (100 mg total) by mouth 3 (three) times daily. 02/26/16  Yes [name] L [name], DO  glucose blood (ACCU-CHEK AVIVA PLUS) test strip 1 each by Other route 3 (three) times daily. Use as instructed 01/21/16  Yes [name] L [name], DO  HUMALOG KWIKPEN 100 UNIT/ML KiwkPen Inject 5 Units into the skin See admin instructions. Per sliding scale 02/19/16  Yes Historical Provider, MD  Insulin Pen Needle 31G X 8 MM MISC Use as directed with insulin pens 03/01/16  Yes [name] L [name], DO  lacosamide (VIMPAT) 50 MG TABS tablet 150mg  BID on M-W-F (dialysis days); 100mg  BID on Tue-Thu-Sat-Sun (non-dialysis days) 02/26/16  Yes [name] L [name], DO  multivitamin (RENA-VIT) TABS tablet Take 1 tablet by mouth at bedtime. 02/26/16  Yes [name] L [name], DO  pantoprazole (PROTONIX) 40 MG tablet Take 1 tablet (40 mg total) by mouth 2 (two) times daily. 02/26/16  Yes [name] L [name], DO  sennosides-docusate sodium (SENOKOT-S) 8.6-50 MG tablet Take 1 tablet by mouth every evening. 02/26/16   Yes [name] L [name], DO          ROS:  As mentioned in history of present illness  Blood pressure 151/73, pulse 77, temperature 99.6 F (37.6 C), temperature source Rectal, resp. rate 12, SpO2 98 %.    Examination:                                                                                                      General lying in bed and appears comfortable Neck supple without any lymphadenopathy Skin has a skin graft on right lower leg Lungs clear to auscultation CV is S1-S2 regular Neurologic alert and oriented 2 only. He is able to have simple conversation Cranial nerves II through XII intact Motor exam shows mild generalized weakness without any focal signs Deep Labs Decreased sensation to temperature and touch in the feet Finger-to-nose testing is normal Speech is fluent Gait was not assessed   Lab Results: Basic Metabolic Panel:  Recent Labs Lab 03/16/16 0214  NA 136  K 4.0  CL 101  CO2 24  GLUCOSE 163*  BUN 50*  CREATININE 7.11*  CALCIUM 9.5    Liver Function Tests:  Recent Labs Lab 03/16/16 0214  AST 30  ALT 22  ALKPHOS 68  BILITOT 0.8  PROT 7.2  ALBUMIN 3.4*   No results for input(s): LIPASE, AMYLASE in the last 168 hours. No results for input(s): AMMONIA in the last 168 hours.  CBC:  Recent Labs Lab 03/16/16 0214  WBC 3.1*  NEUTROABS 2.0  HGB 12.0*  HCT 36.4*  MCV 98.4  PLT 54*    Cardiac Enzymes: No results for input(s): CKTOTAL, CKMB, CKMBINDEX, TROPONINI in the last 168 hours.  Lipid Panel: No results for input(s): CHOL, TRIG, HDL, CHOLHDL, VLDL, LDLCALC in the last 168 hours.  CBG:  Recent Labs Lab 03/16/16 0047  GLUCAP 144*    Microbiology: Results for orders placed or performed during the hospital encounter of 03/04/16  Urine culture     Status: None   Collection Time: 03/04/16   2:00 AM  Result Value Ref Range Status   Specimen Description URINE, CATHETERIZED  Final   Special Requests NONE  Final   Culture NO GROWTH  Final   Report Status 03/05/2016 FINAL  Final  Blood Culture (routine x 2)     Status: None   Collection Time: 03/04/16  2:02 AM  Result Value Ref Range Status   Specimen Description BLOOD RIGHT HAND  Final   Special Requests IN PEDIATRIC BOTTLE  Final   Culture NO GROWTH 5 DAYS  Final   Report Status 03/09/2016 FINAL  Final  Blood Culture (routine x 2)     Status: Abnormal   Collection Time: 03/04/16  2:20 AM  Result Value Ref Range Status   Specimen Description BLOOD RIGHT ARM  Final   Special Requests BOTTLES DRAWN AEROBIC AND ANAEROBIC  Final   Culture  Setup Time   Final    GRAM POSITIVE RODS AEROBIC BOTTLE ONLY Organism ID to follow CRITICAL RESULT CALLED TO, READ BACK BY AND VERIFIED WITHJoette Catching PHARMD 2020 03/05/16 A BROWNING    Culture (A)  Final  DIPHTHEROIDS(CORYNEBACTERIUM SPECIES) Standardized susceptibility testing for this organism is not available.    Report Status 03/07/2016 FINAL  Final  Blood Culture ID Panel (Reflexed)     Status: None   Collection Time: 03/04/16  2:20 AM  Result Value Ref Range Status   Enterococcus species NOT DETECTED NOT DETECTED Final   Vancomycin resistance NOT DETECTED NOT DETECTED Final   Listeria monocytogenes NOT DETECTED NOT DETECTED Final   Staphylococcus species NOT DETECTED NOT DETECTED Final   Staphylococcus aureus NOT DETECTED NOT DETECTED Final   Methicillin resistance NOT DETECTED NOT DETECTED Final   Streptococcus species NOT DETECTED NOT DETECTED Final   Streptococcus agalactiae NOT DETECTED NOT DETECTED Final   Streptococcus pneumoniae NOT DETECTED NOT DETECTED Final   Streptococcus pyogenes NOT DETECTED NOT DETECTED Final   Acinetobacter baumannii NOT DETECTED NOT DETECTED Final   Enterobacteriaceae species NOT DETECTED NOT DETECTED Final   Enterobacter cloacae  complex NOT DETECTED NOT DETECTED Final   Escherichia coli NOT DETECTED NOT DETECTED Final   Klebsiella oxytoca NOT DETECTED NOT DETECTED Final   Klebsiella pneumoniae NOT DETECTED NOT DETECTED Final   Proteus species NOT DETECTED NOT DETECTED Final   Serratia marcescens NOT DETECTED NOT DETECTED Final   Carbapenem resistance NOT DETECTED NOT DETECTED Final   Haemophilus influenzae NOT DETECTED NOT DETECTED Final   Neisseria meningitidis NOT DETECTED NOT DETECTED Final   Pseudomonas aeruginosa NOT DETECTED NOT DETECTED Final   Candida albicans NOT DETECTED NOT DETECTED Final   Candida glabrata NOT DETECTED NOT DETECTED Final   Candida krusei NOT DETECTED NOT DETECTED Final   Candida parapsilosis NOT DETECTED NOT DETECTED Final   Candida tropicalis NOT DETECTED NOT DETECTED Final  MRSA PCR Screening     Status: None   Collection Time: 03/04/16  6:44 AM  Result Value Ref Range Status   MRSA by PCR NEGATIVE NEGATIVE Final    Comment:        The GeneXpert MRSA Assay (FDA approved for NASAL specimens only), is one component of a comprehensive MRSA colonization surveillance program. It is not intended to diagnose MRSA infection nor to guide or monitor treatment for MRSA infections.     Coagulation Studies: No results for input(s): LABPROT, INR in the last 72 hours.  Imaging: Dg Chest 1 View  03/16/2016  CLINICAL DATA:  Altered mental status, possible seizure. History of end-stage renal disease on dialysis. EXAM: CHEST 1 VIEW COMPARISON:  Chest radiograph March 04, 2016 FINDINGS: Cardiomediastinal silhouette is unremarkable for this low inspiratory portable examination with crowded vasculature markings. Dual lead RIGHT cardiac pacemaker in situ. Tunneled dialysis catheter via RIGHT internal jugular venous approach with distal tip projecting in proximal RIGHT atrium. The lungs are clear without pleural effusions or focal consolidations. Trachea projects midline and there is no  pneumothorax. Included soft tissue planes and osseous structures are non-suspicious. IMPRESSION: No acute cardiopulmonary process.  No change in life-support lines. Electronically Signed   By: Awilda Metro M.D.   On: 03/16/2016 01:51   Ct Head Wo Contrast  03/16/2016  CLINICAL DATA:  Altered mental status with possible seizures. EXAM: CT HEAD WITHOUT CONTRAST TECHNIQUE: Contiguous axial images were obtained from the base of the skull through the vertex without intravenous contrast. COMPARISON:  03/04/2016 FINDINGS: Diffuse cerebral atrophy. Mild ventricular dilatation consistent with central atrophy. Low-attenuation changes in the deep white matter consistent with small vessel ischemia. No mass effect or midline shift. No abnormal extra-axial fluid collections. Gray-white matter junctions are distinct. Basal cisterns  are not effaced. No evidence of acute intracranial hemorrhage. No depressed skull fractures. Mucosal thickening in the paranasal sinuses. Mastoid air cells are not opacified. Vascular calcifications. IMPRESSION: No acute intracranial abnormalities. Chronic atrophy and small vessel ischemic changes. Electronically Signed   By: Burman Nieves M.D.   On: 03/16/2016 02:30         03/16/2016, 9:46 AM   Assessment/Plan: This is a 82 year old African-American right-handed male with history of multiple vascular risk factors and history of seizure disorder secondary to prior subdural hematoma presented with altered mental status probably secondary to intermittent seizures. CT scan of the head shows generalized cerebral atrophy with signs of moderate to severe small vessel disease Depakote level was low at 24 No further seizures reported which are described as staring off in space, still has very mild postictal confusion Will simplify his antiseizure medication. Depakote 500 mg extended release by mouth daily Vimpat 150 mg by mouth twice a day Please feel free to call neurology to any  questions or concerns. Patient should be followed up in outpatient neurology clinic 1-2 weeks after discharge

## 2016-03-16 NOTE — H&P (Signed)
History and Physical    [name]:[id number] DOB: Mar 03, 1932 DOA: 03/16/2016  PCP: [name], DO  Patient coming from:    Home    Chief Complaint:  altered mental status  HPI: [name] is a 82 y.o. male with medical history significant for but not limited to  ESRD on HD M/W/F , DM, HTN and seizures.  Patient presented to ED at around midnight for evaluation of mental status changes. Patient is unable to provide any history but according to the EDP note patient and wife were talking when patient suddenly stared off into space. Per wife patient has not missed any of his seizure medications. He dialyzed yesterday. In the emergency department patient was disoriented, appear to be postictal.  Patient has been admitted several times with delirium/altered mental status. In April he was admitted with mental status changes found to be bradycardic with heart rate in the thirties and underwent dual-chamber pacemaker placement.   ED Course:  Oral temp 100, vital signs otherwise normal Wbc 3.1, hemoglobin 12, platelets 54, glucose 163 Normal lactic acid No acute findings on head CT scan or chest x-ray  Review of Systems: Unable to be obtained secondary to altered mental status  Past Medical History  Diagnosis Date  . Hypertension   . Diabetes mellitus without complication (HCC)   . Arthritis   . History of blood transfusion   . GI bleed   . Seizure (HCC)   . Subdural hematoma (HCC) 05/2015    from fall  . Sleep apnea     wears CPAP n/c  . Pulmonary hypertension (HCC)     severe pulmonary HTN by 01/14/13 echo  . ESRD (end stage renal disease) on dialysis [facility])     "MWF" (03/04/2016)    Past Surgical History  Procedure Laterality Date  . Esophagogastroduodenoscopy  10/03/2012    Procedure: ESOPHAGOGASTRODUODENOSCOPY (EGD);  Surgeon: Theda Belfast, MD;  Location: [facility] ENDOSCOPY;  Service: Endoscopy;  Laterality: N/A;  . Colonoscopy  10/04/2012    Procedure: COLONOSCOPY;   Surgeon: Theda Belfast, MD;  Location: [facility] ENDOSCOPY;  Service: Endoscopy;  Laterality: N/A;  . Colonoscopy with esophagogastroduodenoscopy (egd) Left 11/30/2012    Procedure: COLONOSCOPY WITH ESOPHAGOGASTRODUODENOSCOPY (EGD);  Surgeon: Theda Belfast, MD;  Location: [facility] ENDOSCOPY;  Service: Endoscopy;  Laterality: Left;  . Spine surgery  2004    back fusion/ [facility] Penumalli,MD  . Retinal laser procedure Right 04/2015  . Av fistula placement Left 09/09/2015    Procedure: INSERTION OF ARTERIOVENOUS  GORE-TEX GRAFT Left  ARM;  Surgeon: Sherren Kerns, MD;  Location: [facility] OR;  Service: Vascular;  Laterality: Left;  . Insertion of dialysis catheter Right 11/27/2015    Procedure: INSERTION OF DIALYSIS CATHETER - Right Internal Jugular Placement;  Surgeon: Chuck Hint, MD;  Location: [facility] OR;  Service: Vascular;  Laterality: Right;  . Ep implantable device N/A 12/12/2015    Procedure: Pacemaker Implant;  Surgeon: Will Jorja Loa, MD;  Location: [facility] INVASIVE CV LAB;  Service: Cardiovascular;  Laterality: N/A;  . Peripheral vascular catheterization Left 01/27/2016    Procedure: Fistulagram;  Surgeon: Nada Libman, MD;  Location: [facility] INVASIVE CV LAB;  Service: Cardiovascular;  Laterality: Left;  upper arm  . Ligation arteriovenous gortex graft Left 01/27/2016    Procedure: LEFT LIGATION ARTERIOVENOUS GORTEX GRAFT;  Surgeon: Chuck Hint, MD;  Location: [facility] OR;  Service: Vascular;  Laterality: Left;    Social History   Social History  . Marital Status:  Married    Spouse Name: patricia  . Number of Children: 2  . Years of Education: PHD   Occupational History  . Retired    Social History Main Topics  . Smoking status: Never Smoker   . Smokeless tobacco: Never Used  . Alcohol Use: No  . Drug Use: No  . Sexual Activity: No   Other Topics Concern  . Not on file   Social History Narrative   Patient currently in [facility] rehab.   Caffeine Use: 1-2 cups  occasionally    Allergies  Allergen Reactions  . Aspirin Other (See Comments)    Bleeding   . Lactose Intolerance (Gi) Diarrhea    Family History  Problem Relation Age of Onset  . Sudden death Mother   . Sudden death Father     Prior to Admission medications   Medication Sig Start Date End Date Taking? Authorizing Provider  amLODipine (NORVASC) 5 MG tablet Take 1 tablet (5 mg total) by mouth at bedtime. 02/26/16  Yes [name] L [name], DO  calcitRIOL (ROCALTROL) 0.5 MCG capsule Take 2 capsules (1 mcg total) by mouth every Monday, Wednesday, and Friday with hemodialysis. 02/26/16  Yes [name] L [name], DO  calcium acetate (PHOSLO) 667 MG capsule Take 3 capsules by mouth three times a day 02/26/16  Yes [name] L [name], DO  divalproex (DEPAKOTE ER) 250 MG 24 hr tablet Take 1 tablet (250 mg total) by mouth every 12 (twelve) hours. 02/26/16  Yes [name] L [name], DO  fexofenadine (ALLEGRA) 180 MG tablet Take 1 tablet (180 mg total) by mouth daily as needed for allergies or rhinitis. 02/26/16  Yes [name] L [name], DO  folic acid (FOLVITE) 1 MG tablet Take 1 tablet (1 mg total) by mouth daily. 02/26/16  Yes [name] L [name], DO  gabapentin (NEURONTIN) 100 MG capsule Take 1 capsule (100 mg total) by mouth 3 (three) times daily. 02/26/16  Yes [name] L [name], DO  glucose blood (ACCU-CHEK AVIVA PLUS) test strip 1 each by Other route 3 (three) times daily. Use as instructed 01/21/16  Yes [name] L [name], DO  HUMALOG KWIKPEN 100 UNIT/ML KiwkPen Inject 5 Units into the skin See admin instructions. Per sliding scale 02/19/16  Yes Historical Provider, MD  Insulin Pen Needle 31G X 8 MM MISC Use as directed with insulin pens 03/01/16  Yes [name] L [name], DO  lacosamide (VIMPAT) 50 MG TABS tablet 150mg  BID on M-W-F (dialysis days); 100mg  BID on Tue-Thu-Sat-Sun (non-dialysis days) 02/26/16  Yes [name] L [name], DO  multivitamin (RENA-VIT) TABS tablet Take 1 tablet by mouth at bedtime. 02/26/16  Yes [name] L [name], DO    pantoprazole (PROTONIX) 40 MG tablet Take 1 tablet (40 mg total) by mouth 2 (two) times daily. 02/26/16  Yes [name] L [name], DO  sennosides-docusate sodium (SENOKOT-S) 8.6-50 MG tablet Take 1 tablet by mouth every evening. 02/26/16  Yes 12-13-1978, DO    Physical Exam: Filed Vitals:   03/16/16 0415 03/16/16 0430 03/16/16 0500 03/16/16 0515  BP: 144/64 142/69 137/67 143/68  Pulse: 78 79 77 77  Temp:      TempSrc:      Resp:      SpO2: 94% 95% 95% 95%    Constitutional:  Well developed black male in NAD, calm, comfortable Filed Vitals:   03/16/16 0415 03/16/16 0430 03/16/16 0500 03/16/16 0515  BP: 144/64 142/69 137/67 143/68  Pulse: 78 79 77 77  Temp:  TempSrc:      Resp:      SpO2: 94% 95% 95% 95%   Eyes: Pupils equal, sluggish reaction to light.  ENMT: Mucous membranes are moist. Posterior pharynx clear of any exudate or lesions..  Neck: normal, supple, no masses. IV cath right chest with dry dressing. Respiratory: A few LLL crackles, lungs otherwise clear to auscultation bilaterally, no wheezing, no crackles. Normal respiratory effort. No accessory muscle use.  Cardiovascular: Regular rate and rhythm, + murmurs. No rubs / gallops. No extremity edema. Abdomen: soft, nondistended, no tenderness, no masses palpated.  Bowel sounds positive.  Musculoskeletal: no clubbing / cyanosis. No joint deformity upper and lower extremities. Good ROM, no contractures. Normal muscle tone.  Skin: no rashes, lesions, ulcers. Right foot with major scarring Neurologic: Sleepy, not oriented to place or time. Follows some commands but quickly falls asleep.  Psychiatric: Cooperative. Unable to assess further given excessive sleepiness  Labs on Admission: I have personally reviewed following labs and imaging studies   Urine analysis:    Component Value Date/Time   COLORURINE YELLOW 03/16/2016 0454   APPEARANCEUR CLEAR 03/16/2016 0454   LABSPEC 1.013 03/16/2016 0454   PHURINE 7.0 03/16/2016  0454   GLUCOSEU 250* 03/16/2016 0454   HGBUR NEGATIVE 03/16/2016 0454   BILIRUBINUR NEGATIVE 03/16/2016 0454   KETONESUR NEGATIVE 03/16/2016 0454   PROTEINUR 100* 03/16/2016 0454   UROBILINOGEN 0.2 06/20/2015 0845   NITRITE NEGATIVE 03/16/2016 0454   LEUKOCYTESUR NEGATIVE 03/16/2016 0454    Radiological Exams on Admission: Dg Chest 1 View  03/16/2016  CLINICAL DATA:  Altered mental status, possible seizure. History of end-stage renal disease on dialysis. EXAM: CHEST 1 VIEW COMPARISON:  Chest radiograph March 04, 2016 FINDINGS: Cardiomediastinal silhouette is unremarkable for this low inspiratory portable examination with crowded vasculature markings. Dual lead RIGHT cardiac pacemaker in situ. Tunneled dialysis catheter via RIGHT internal jugular venous approach with distal tip projecting in proximal RIGHT atrium. The lungs are clear without pleural effusions or focal consolidations. Trachea projects midline and there is no pneumothorax. Included soft tissue planes and osseous structures are non-suspicious. IMPRESSION: No acute cardiopulmonary process.  No change in life-support lines. Electronically Signed   By: Awilda Metro M.D.   On: 03/16/2016 01:51   Ct Head Wo Contrast  03/16/2016  CLINICAL DATA:  Altered mental status with possible seizures. EXAM: CT HEAD WITHOUT CONTRAST TECHNIQUE: Contiguous axial images were obtained from the base of the skull through the vertex without intravenous contrast. COMPARISON:  03/04/2016 FINDINGS: Diffuse cerebral atrophy. Mild ventricular dilatation consistent with central atrophy. Low-attenuation changes in the deep white matter consistent with small vessel ischemia. No mass effect or midline shift. No abnormal extra-axial fluid collections. Gray-white matter junctions are distinct. Basal cisterns are not effaced. No evidence of acute intracranial hemorrhage. No depressed skull fractures. Mucosal thickening in the paranasal sinuses. Mastoid air cells are not  opacified. Vascular calcifications. IMPRESSION: No acute intracranial abnormalities. Chronic atrophy and small vessel ischemic changes. Electronically Signed   By: Burman Nieves M.D.   On: 03/16/2016 02:30    EKG: Independently reviewed.   EKG Interpretation  Date/Time:  Tuesday March 16 2016 00:48:17 EDT Ventricular Rate:  87 PR Interval:    QRS Duration: 89 QT Interval:  428 QTC Calculation: 515 R Axis:   24 Text Interpretation:  Sinus rhythm Prolonged PR interval Borderline T abnormalities, lateral leads Prolonged QT interval No significant change since last tracing Confirmed by WARD,  DO, KRISTEN ([id number]) on 03/16/2016 12:55:29 AM  Assessment/Plan    Encephalopathy. Previous admissions for same. In April patient admitted with mental status changes in setting of bradycardia resulting in dual pacemaker placement. No signs of infection todaydialyzed yesterday and labs unrevealing. Urine unremarkable. CXR and head CTscan all unrevealing. Patient does have a history of seizures, depakote level subtherhapeutic. EDP paged Neuro this am, I will call again to make sure they received consult.  -OBS-Tele -Seizure precautions -continue anti-seizure medications.  -Await Neurology evaluation.  -gentle hydration at 50cc/hr until tolerating PO (still makes urine)  Hx of seizures.  -continue Depakote, Vimpat (when tolerating PO). Needs loading dose of Depakote recommended by pharmacy but just spoke with Neurology and they will order  ESRD on dialysis M/W/F. Apparently dialyzed yesterday. Will let Nephrology know patient if here.   Prolonged Q-T interval on ECG - 515. QTc 431 late June. K+ normal.  -avoid QT prolonging medications.  -monitor on telemetry  DM, control undetermined.  -A1c -CBGs, SSI  Hypertension, controlled Holding Norvasc today given altered mental status, resume in am.  -IV hydralazine prn   Pancytopenia (HCC), chronic and stable  Hx of bradycardia, s/p  placement of dual chamber cardiac pacemaker. Has follow appt with electrophysiologist today but will not make it - I notified Cards office.   DVT prophylaxis:  SCDs   Code Status:     DNR Family Communication:  Called Amarrion Donmoyer ( Wife ) with condition update and discussed code.   Disposition Plan: Discharge home in 24-48 hours              Consults called:   Neurology- Dr. Desmond Lope Nephrology- Dr. Arlean Hopping  Admission status:  Observation -telemetry   Willette Cluster NP [facility] Pager [phone]  If 7PM-7AM, please contact night-coverage [email or web address] Password [facility]  03/16/2016, 7:32 AM

## 2016-03-16 NOTE — Progress Notes (Signed)
This encounter was created in error - please disregard.

## 2016-03-16 NOTE — Progress Notes (Signed)
Patient admitted under Observation status, if changed to Inpatient status please notify us and will do formal consultation.   Admitted w AMS, postictal state.  Hx seizure d/o on 2 AEDs.  Gets HD MWF schedule.  Will arrange for dialysis here Wed if pt is inpatient tomorrow.  CXR clear, below dry wt, BP normal.    [facility] MWF 3h 89.5kg 2/2.25 bath P4 R IJ TDC  Heparin none  [name] [facility] pager [phone]    cell [phone] 03/13/2016, 11:45 AM

## 2016-03-16 NOTE — ED Notes (Signed)
Patient transported to CT 

## 2016-03-17 DIAGNOSIS — G9340 Encephalopathy, unspecified: Secondary | ICD-10-CM | POA: Diagnosis not present

## 2016-03-17 DIAGNOSIS — G4730 Sleep apnea, unspecified: Secondary | ICD-10-CM | POA: Diagnosis not present

## 2016-03-17 DIAGNOSIS — D61818 Other pancytopenia: Secondary | ICD-10-CM

## 2016-03-17 DIAGNOSIS — Z66 Do not resuscitate: Secondary | ICD-10-CM | POA: Diagnosis not present

## 2016-03-17 DIAGNOSIS — Z79899 Other long term (current) drug therapy: Secondary | ICD-10-CM | POA: Diagnosis not present

## 2016-03-17 DIAGNOSIS — G40909 Epilepsy, unspecified, not intractable, without status epilepticus: Secondary | ICD-10-CM | POA: Diagnosis not present

## 2016-03-17 DIAGNOSIS — I272 Other secondary pulmonary hypertension: Secondary | ICD-10-CM | POA: Diagnosis not present

## 2016-03-17 DIAGNOSIS — D696 Thrombocytopenia, unspecified: Secondary | ICD-10-CM | POA: Diagnosis not present

## 2016-03-17 DIAGNOSIS — Z886 Allergy status to analgesic agent status: Secondary | ICD-10-CM | POA: Diagnosis not present

## 2016-03-17 DIAGNOSIS — N186 End stage renal disease: Secondary | ICD-10-CM | POA: Diagnosis not present

## 2016-03-17 DIAGNOSIS — Z950 Presence of cardiac pacemaker: Secondary | ICD-10-CM | POA: Diagnosis not present

## 2016-03-17 DIAGNOSIS — R4182 Altered mental status, unspecified: Secondary | ICD-10-CM | POA: Diagnosis not present

## 2016-03-17 DIAGNOSIS — Z981 Arthrodesis status: Secondary | ICD-10-CM | POA: Diagnosis not present

## 2016-03-17 DIAGNOSIS — I120 Hypertensive chronic kidney disease with stage 5 chronic kidney disease or end stage renal disease: Secondary | ICD-10-CM | POA: Diagnosis not present

## 2016-03-17 DIAGNOSIS — Z91011 Allergy to milk products: Secondary | ICD-10-CM | POA: Diagnosis not present

## 2016-03-17 DIAGNOSIS — Z794 Long term (current) use of insulin: Secondary | ICD-10-CM | POA: Diagnosis not present

## 2016-03-17 DIAGNOSIS — E1122 Type 2 diabetes mellitus with diabetic chronic kidney disease: Secondary | ICD-10-CM | POA: Diagnosis not present

## 2016-03-17 DIAGNOSIS — Z992 Dependence on renal dialysis: Secondary | ICD-10-CM | POA: Diagnosis not present

## 2016-03-17 LAB — BASIC METABOLIC PANEL
Anion gap: 11 (ref 5–15)
BUN: 63 mg/dL — AB (ref 6–20)
CHLORIDE: 104 mmol/L (ref 101–111)
CO2: 24 mmol/L (ref 22–32)
CREATININE: 8.26 mg/dL — AB (ref 0.61–1.24)
Calcium: 9.2 mg/dL (ref 8.9–10.3)
GFR calc Af Amer: 6 mL/min — ABNORMAL LOW (ref >60)
GFR calc non Af Amer: 5 mL/min — ABNORMAL LOW (ref >60)
GLUCOSE: 187 mg/dL — AB (ref 65–99)
Potassium: 4.4 mmol/L (ref 3.5–5.1)
SODIUM: 139 mmol/L (ref 135–145)

## 2016-03-17 LAB — GLUCOSE, CAPILLARY: Glucose-Capillary: 181 mg/dL — ABNORMAL HIGH (ref 65–99)

## 2016-03-17 LAB — CBC
HCT: 35.9 % — ABNORMAL LOW (ref 39.0–52.0)
Hemoglobin: 11.7 g/dL — ABNORMAL LOW (ref 13.0–17.0)
MCH: 32.2 pg (ref 26.0–34.0)
MCHC: 32.6 g/dL (ref 30.0–36.0)
MCV: 98.9 fL (ref 78.0–100.0)
PLATELETS: 45 10*3/uL — AB (ref 150–400)
RBC: 3.63 MIL/uL — ABNORMAL LOW (ref 4.22–5.81)
RDW: 16.3 % — AB (ref 11.5–15.5)
WBC: 2.4 10*3/uL — AB (ref 4.0–10.5)

## 2016-03-17 MED ORDER — PENTAFLUOROPROP-TETRAFLUOROETH EX AERO: 1.0000 "application " | INHALATION_SPRAY | CUTANEOUS | Status: DC | PRN

## 2016-03-17 MED ORDER — HEPARIN SODIUM (PORCINE) 1000 UNIT/ML DIALYSIS: 1000.0000 [IU] | INTRAMUSCULAR | Status: DC | PRN

## 2016-03-17 MED ORDER — LIDOCAINE-PRILOCAINE 2.5-2.5 % EX CREA: 1.0000 "application " | TOPICAL_CREAM | CUTANEOUS | Status: DC | PRN

## 2016-03-17 MED ORDER — DIVALPROEX SODIUM ER 500 MG PO TB24: 500.0000 mg | ORAL_TABLET | Freq: Two times a day (BID) | ORAL | Status: DC

## 2016-03-17 MED ORDER — LIDOCAINE HCL (PF) 1 % IJ SOLN: 5.0000 mL | INTRAMUSCULAR | Status: DC | PRN

## 2016-03-17 MED ORDER — ALTEPLASE 2 MG IJ SOLR: 2.0000 mg | Freq: Once | INTRAMUSCULAR | Status: DC | PRN

## 2016-03-17 MED ORDER — SODIUM CHLORIDE 0.9 % IV SOLN: 100.0000 mL | INTRAVENOUS | Status: DC | PRN

## 2016-03-17 MED ORDER — LACOSAMIDE 150 MG PO TABS: 150.0000 mg | ORAL_TABLET | Freq: Two times a day (BID) | ORAL | Status: AC

## 2016-03-17 NOTE — Discharge Summary (Signed)
PATIENT DETAILS Name: [name] Age: 82 y.o. Sex: male Date of Birth: 1932/04/13 MRN: [id number]. Admitting Physician: Eduard Clos, MD WUJ:[id number], [facility] DO  Admit Date: 03/16/2016 Discharge date: 03/17/2016  Recommendations for Outpatient Follow-up:  1. Note-Depakote and Vimpat and dosing changed (see below) 2. Has developed chronic thrombocytopenia over the past year or so, suggest hematology evaluation in the outpatient setting. 3. Consider referral to neurology 4. Follow CBC-follow platelet count closely  PRIMARY DISCHARGE DIAGNOSIS:  Principal Problem:   Encephalopathy Active Problems:   ESRD on dialysis (HCC)   Pancytopenia (HCC)   S/P placement of cardiac pacemaker   Prolonged Q-T interval on ECG      PAST MEDICAL HISTORY: Past Medical History  Diagnosis Date  . Hypertension   . Diabetes mellitus without complication (HCC)   . Arthritis   . History of blood transfusion   . GI bleed   . Seizure (HCC)   . Subdural hematoma (HCC) 05/2015    from fall  . Sleep apnea     wears CPAP n/c  . Pulmonary hypertension (HCC)     severe pulmonary HTN by 01/14/13 echo  . ESRD (end stage renal disease) on dialysis [facility])     "MWF" (03/04/2016)    DISCHARGE MEDICATIONS: Current Discharge Medication List    CONTINUE these medications which have CHANGED   Details  divalproex (DEPAKOTE ER) 500 MG 24 hr tablet Take 1 tablet (500 mg total) by mouth every 12 (twelve) hours. Qty: 30 tablet, Refills: 0    Lacosamide 150 MG TABS Take 1 tablet (150 mg total) by mouth 2 (two) times daily. Qty: 60 tablet, Refills: 0      CONTINUE these medications which have NOT CHANGED   Details  amLODipine (NORVASC) 5 MG tablet Take 1 tablet (5 mg total) by mouth at bedtime. Qty: 30 tablet, Refills: 5    calcitRIOL (ROCALTROL) 0.5 MCG capsule Take 2 capsules (1 mcg total) by mouth every Monday, Wednesday, and Friday with hemodialysis. Qty: 24 capsule, Refills: 5    calcium  acetate (PHOSLO) 667 MG capsule Take 3 capsules by mouth three times a day Qty: 180 capsule, Refills: 5    fexofenadine (ALLEGRA) 180 MG tablet Take 1 tablet (180 mg total) by mouth daily as needed for allergies or rhinitis. Qty: 30 tablet, Refills: 0    folic acid (FOLVITE) 1 MG tablet Take 1 tablet (1 mg total) by mouth daily. Qty: 30 tablet, Refills: 5    gabapentin (NEURONTIN) 100 MG capsule Take 1 capsule (100 mg total) by mouth 3 (three) times daily. Qty: 90 capsule, Refills: 5   Associated Diagnoses: Degenerative disc disease, lumbar    glucose blood (ACCU-CHEK AVIVA PLUS) test strip 1 each by Other route 3 (three) times daily. Use as instructed Qty: 100 each, Refills: 3    HUMALOG KWIKPEN 100 UNIT/ML KiwkPen Inject 5 Units into the skin See admin instructions. Per sliding scale Refills: 0    Insulin Pen Needle 31G X 8 MM MISC Use as directed with insulin pens Qty: 100 each, Refills: 5    multivitamin (RENA-VIT) TABS tablet Take 1 tablet by mouth at bedtime. Qty: 30 tablet, Refills: 5    pantoprazole (PROTONIX) 40 MG tablet Take 1 tablet (40 mg total) by mouth 2 (two) times daily. Qty: 60 tablet, Refills: 5    sennosides-docusate sodium (SENOKOT-S) 8.6-50 MG tablet Take 1 tablet by mouth every evening. Qty: 30 tablet, Refills: 5        ALLERGIES:  Allergies  Allergen Reactions  . Aspirin Other (See Comments)    Bleeding   . Lactose Intolerance (Gi) Diarrhea    BRIEF HPI:  See H&P, Labs, Consult and Test reports for all details in brief,[name] is an 82 y.o. male with history of hypertension, diabetes, GI bleed, sleep apnea, pulmonary hypertension, end-stage renal disease on dialysis months Monday Wednesday Friday, prior history of subdural hematoma and seizure disorder presented with altered mental status.  CONSULTATIONS:   nephrology and neurology  PERTINENT RADIOLOGIC STUDIES: Dg Chest 1 View  03/16/2016  CLINICAL DATA:  Altered mental status,  possible seizure. History of end-stage renal disease on dialysis. EXAM: CHEST 1 VIEW COMPARISON:  Chest radiograph March 04, 2016 FINDINGS: Cardiomediastinal silhouette is unremarkable for this low inspiratory portable examination with crowded vasculature markings. Dual lead RIGHT cardiac pacemaker in situ. Tunneled dialysis catheter via RIGHT internal jugular venous approach with distal tip projecting in proximal RIGHT atrium. The lungs are clear without pleural effusions or focal consolidations. Trachea projects midline and there is no pneumothorax. Included soft tissue planes and osseous structures are non-suspicious. IMPRESSION: No acute cardiopulmonary process.  No change in life-support lines. Electronically Signed   By: Awilda Metro M.D.   On: 03/16/2016 01:51   Ct Head Wo Contrast  03/16/2016  CLINICAL DATA:  Altered mental status with possible seizures. EXAM: CT HEAD WITHOUT CONTRAST TECHNIQUE: Contiguous axial images were obtained from the base of the skull through the vertex without intravenous contrast. COMPARISON:  03/04/2016 FINDINGS: Diffuse cerebral atrophy. Mild ventricular dilatation consistent with central atrophy. Low-attenuation changes in the deep white matter consistent with small vessel ischemia. No mass effect or midline shift. No abnormal extra-axial fluid collections. Gray-white matter junctions are distinct. Basal cisterns are not effaced. No evidence of acute intracranial hemorrhage. No depressed skull fractures. Mucosal thickening in the paranasal sinuses. Mastoid air cells are not opacified. Vascular calcifications. IMPRESSION: No acute intracranial abnormalities. Chronic atrophy and small vessel ischemic changes. Electronically Signed   By: Burman Nieves M.D.   On: 03/16/2016 02:30   Ct Head Wo Contrast  03/04/2016  CLINICAL DATA:  Altered mental status and fever. EXAM: CT HEAD WITHOUT CONTRAST TECHNIQUE: Contiguous axial images were obtained from the base of the skull  through the vertex without intravenous contrast. COMPARISON:  10/23/2015 FINDINGS: There is no intracranial hemorrhage, mass or evidence of acute infarction. There is moderately severe generalized atrophy. There is extensive chronic microvascular ischemic change. There is no significant extra-axial fluid collection. No acute intracranial findings are evident. The calvarium and skullbase are intact. The visible paranasal sinuses and orbits are unremarkable. IMPRESSION: Moderately severe generalized atrophy and chronic white matter hypodensities which likely represent small vessel ischemic disease. No acute intracranial findings. Electronically Signed   By: Ellery Plunk M.D.   On: 03/04/2016 06:53   Dg Chest Port 1 View  03/04/2016  CLINICAL DATA:  Altered mental status. EXAM: PORTABLE CHEST 1 VIEW COMPARISON:  12/13/2015 FINDINGS: Intact transvenous cardiac leads. Right jugular dual-lumen central line extending into the low SVC. The lungs are clear. The pulmonary vasculature is normal. No large effusions. IMPRESSION: No active disease. Electronically Signed   By: Ellery Plunk M.D.   On: 03/04/2016 01:45     PERTINENT LAB RESULTS: CBC:  Recent Labs  03/16/16 0214 03/17/16 0343  WBC 3.1* 2.4*  HGB 12.0* 11.7*  HCT 36.4* 35.9*  PLT 54* 45*   CMET CMP     Component Value Date/Time   NA 139 03/17/2016  0343   NA 141 05/13/2015 0925   K 4.4 03/17/2016 0343   CL 104 03/17/2016 0343   CO2 24 03/17/2016 0343   GLUCOSE 187* 03/17/2016 0343   GLUCOSE 127* 05/13/2015 0925   BUN 63* 03/17/2016 0343   BUN 56* 05/13/2015 0925   CREATININE 8.26* 03/17/2016 0343   CALCIUM 9.2 03/17/2016 0343   PROT 7.2 03/16/2016 0214   PROT 7.1 08/15/2014 1059   ALBUMIN 3.4* 03/16/2016 0214   ALBUMIN 4.5 08/15/2014 1059   AST 30 03/16/2016 0214   ALT 22 03/16/2016 0214   ALKPHOS 68 03/16/2016 0214   BILITOT 0.8 03/16/2016 0214   GFRNONAA 5* 03/17/2016 0343   GFRAA 6* 03/17/2016 0343     GFR Estimated Creatinine Clearance: 7.6 mL/min (by C-G formula based on Cr of 8.26). No results for input(s): LIPASE, AMYLASE in the last 72 hours.  Recent Labs  03/16/16 0934  CKTOTAL 83   Invalid input(s): POCBNP No results for input(s): DDIMER in the last 72 hours. No results for input(s): HGBA1C in the last 72 hours. No results for input(s): CHOL, HDL, LDLCALC, TRIG, CHOLHDL, LDLDIRECT in the last 72 hours. No results for input(s): TSH, T4TOTAL, T3FREE, THYROIDAB in the last 72 hours.  Invalid input(s): FREET3 No results for input(s): VITAMINB12, FOLATE, FERRITIN, TIBC, IRON, RETICCTPCT in the last 72 hours. Coags: No results for input(s): INR in the last 72 hours.  Invalid input(s): PT Microbiology: Recent Results (from the past 240 hour(s))  Blood culture (routine x 2)     Status: None (Preliminary result)   Collection Time: 03/16/16  2:00 AM  Result Value Ref Range Status   Specimen Description BLOOD RIGHT HAND  Final   Special Requests IN PEDIATRIC BOTTLE  Final   Culture NO GROWTH < 12 HOURS  Final   Report Status PENDING  Incomplete  Blood culture (routine x 2)     Status: None (Preliminary result)   Collection Time: 03/16/16  2:14 AM  Result Value Ref Range Status   Specimen Description BLOOD LEFT HAND  Final   Special Requests BOTTLES DRAWN AEROBIC AND ANAEROBIC  Final   Culture NO GROWTH < 12 HOURS  Final   Report Status PENDING  Incomplete     BRIEF HOSPITAL COURSE:   Principal Problem: Encephalopathy:Suspect this was secondary to postictal state in a setting of breakthrough seizures. CT head negative for acute abnormalities, evaluated by neurology, recommendations were to increase Depakote to 500 mg daily and to increase Vimpat 250 mg twice a day. Currently during my evaluation this morning-patient is awake and alert and seems significantly improved-I suspect he is back to his usual baseline. No further recommendations from neurology, will  discharge patient to have patient follow up with his outpatient M.D.  Active Problems: End-stage renal disease on dialysis Monday, Wednesday and Friday: Evaluated by nephrology, underwent hemodialysis on day of discharge. Appears to be stable.   History of chronic pancytopenia/thrombocytopenia: Continue outpatient follow-up with PCP.  The rest of his chronic problems were stable during this short overnight hospital stay  TODAY-DAY OF DISCHARGE:  Subjective:   Gregroy Dombkowski today has no headache,no chest abdominal pain,no new weakness tingling or numbness, feels much better wants to go home today.   Objective:   Blood pressure 118/63, pulse 73, temperature 98.4 F (36.9 C), temperature source Oral, resp. rate 17, height 6' (1.829 m), weight 82.3 kg (181 lb 7 oz), SpO2 72 %.  Intake/Output Summary (Last 24 hours) at 03/17/16 1157  Last data filed at 03/17/16 1115  Gross per 24 hour  Intake 1113.33 ml  Output    300 ml  Net 813.33 ml   Filed Weights   03/16/16 1957 03/17/16 0738 03/17/16 1115  Weight: 93.35 kg (205 lb 12.8 oz) 82.3 kg (181 lb 7 oz) 82.3 kg (181 lb 7 oz)    Exam Awake Alert, Oriented *3, No new F.N deficits, Normal affect [place].AT,PERRAL Supple Neck,No JVD, No cervical lymphadenopathy appriciated.  Symmetrical Chest wall movement, Good air movement bilaterally, CTAB RRR,No Gallops,Rubs or new Murmurs, No Parasternal Heave +ve B.Sounds, Abd Soft, Non tender, No organomegaly appriciated, No rebound -guarding or rigidity. No Cyanosis, Clubbing or edema, No new Rash or bruise  DISCHARGE CONDITION: Stable  DISPOSITION: Home  DISCHARGE INSTRUCTIONS:    Activity:  As tolerated with Full fall precautions use walker/cane & assistance as needed  Get Medicines reviewed and adjusted: Please take all your medications with you for your next visit with your Primary MD  Please request your Primary MD to go over all hospital tests and procedure/radiological results at the  follow up, please ask your Primary MD to get all Hospital records sent to his/her office.  If you experience worsening of your admission symptoms, develop shortness of breath, life threatening emergency, suicidal or homicidal thoughts you must seek medical attention immediately by calling 911 or calling your MD immediately  if symptoms less severe.  You must read complete instructions/literature along with all the possible adverse reactions/side effects for all the Medicines you take and that have been prescribed to you. Take any new Medicines after you have completely understood and accpet all the possible adverse reactions/side effects.   Do not drive when taking Pain medications.   Do not take more than prescribed Pain, Sleep and Anxiety Medications  Special Instructions: If you have smoked or chewed Tobacco  in the last 2 yrs please stop smoking, stop any regular Alcohol  and or any Recreational drug use.  Wear Seat belts while driving.  Please note  You were cared for by a hospitalist during your hospital stay. Once you are discharged, your primary care physician will handle any further medical issues. Please note that NO REFILLS for any discharge medications will be authorized once you are discharged, as it is imperative that you return to your primary care physician (or establish a relationship with a primary care physician if you do not have one) for your aftercare needs so that they can reassess your need for medications and monitor your lab values.   Diet recommendation: Diabetic Diet Heart Healthy diet   Discharge Instructions    Call MD for:  persistant dizziness or light-headedness    Complete by:  As directed      Call MD for:    Complete by:  As directed   Recurrent seizure     Diet - low sodium heart healthy    Complete by:  As directed      Increase activity slowly    Complete by:  As directed            Follow-up Information    Follow up with [name], [name],  DO. Schedule an appointment as soon as possible for a visit in 1 week.   Specialty:  Geriatric Medicine   Why:  Hospital follow up   Contact information:   [address] [place] [address] [phone]       Please follow up.   Contact information:   Hemodialysis  Center at your usual schedule      Total Time spent on discharge equals 25  minutes.  SignedJeoffrey Massed 03/17/2016 11:57 AM

## 2016-03-17 NOTE — Procedures (Signed)
Patient admitted under Observation status, if changed to Inpatient status please notify us and will do formal consultation.   Admitted w AMS, postictal state. Hx seizure d/o on 2 AEDs. Doing better.  To [place], he appears at his baseline mental status and usual behavior.  HD today is going well w/o difficulty.     [facility] MWF 3h 89.5kg 2/2.25 bath P4 R IJ TDC Heparin none  [name] [facility] pager [phone]    cell [phone] 03/17/2016, 12:18 PM

## 2016-03-17 NOTE — Progress Notes (Signed)
[name] to be D/C'd Home per MD order.  Discussed prescriptions and follow up appointments with the patient. Prescriptions given to patient, medication list explained in detail. Pt verbalized understanding.    Medication List    TAKE these medications        amLODipine 5 MG tablet  Commonly known as:  NORVASC  Take 1 tablet (5 mg total) by mouth at bedtime.     calcitRIOL 0.5 MCG capsule  Commonly known as:  ROCALTROL  Take 2 capsules (1 mcg total) by mouth every Monday, Wednesday, and Friday with hemodialysis.     calcium acetate 667 MG capsule  Commonly known as:  PHOSLO  Take 3 capsules by mouth three times a day     divalproex 500 MG 24 hr tablet  Commonly known as:  DEPAKOTE ER  Take 1 tablet (500 mg total) by mouth every 12 (twelve) hours.     fexofenadine 180 MG tablet  Commonly known as:  ALLEGRA  Take 1 tablet (180 mg total) by mouth daily as needed for allergies or rhinitis.     folic acid 1 MG tablet  Commonly known as:  FOLVITE  Take 1 tablet (1 mg total) by mouth daily.     gabapentin 100 MG capsule  Commonly known as:  NEURONTIN  Take 1 capsule (100 mg total) by mouth 3 (three) times daily.     glucose blood test strip  Commonly known as:  ACCU-CHEK AVIVA PLUS  1 each by Other route 3 (three) times daily. Use as instructed     HUMALOG KWIKPEN 100 UNIT/ML KiwkPen  Generic drug:  insulin lispro  Inject 5 Units into the skin See admin instructions. Per sliding scale     Insulin Pen Needle 31G X 8 MM Misc  Use as directed with insulin pens     Lacosamide 150 MG Tabs  Commonly known as:  VIMPAT  Take 1 tablet (150 mg total) by mouth 2 (two) times daily.     multivitamin Tabs tablet  Take 1 tablet by mouth at bedtime.     pantoprazole 40 MG tablet  Commonly known as:  PROTONIX  Take 1 tablet (40 mg total) by mouth 2 (two) times daily.     sennosides-docusate sodium 8.6-50 MG tablet  Commonly known as:  SENOKOT-S  Take 1 tablet by mouth every  evening.        Filed Vitals:   03/17/16 1115 03/17/16 1200  BP: 118/63 120/65  Pulse: 73 75  Temp: 98.4 F (36.9 C) 98.2 F (36.8 C)  Resp: 17 18    Skin clean, dry and intact without evidence of skin break down, no evidence of skin tears noted. IV catheter discontinued intact. Site without signs and symptoms of complications. Dressing and pressure applied. Pt denies pain at this time. No complaints noted.  An After Visit Summary was printed and given to the patient. Patient escorted via WC, and D/C home via private auto.  [name] BSN, RN

## 2016-03-18 ENCOUNTER — Emergency Department (HOSPITAL_COMMUNITY): Payer: Medicare Other

## 2016-03-18 ENCOUNTER — Inpatient Hospital Stay (HOSPITAL_COMMUNITY)
Admission: EM | Admit: 2016-03-18 | Discharge: 2016-03-21 | DRG: 070 | Disposition: A | Discharge status: Home / Self Care | Payer: Medicare Other | Attending: Internal Medicine | Admitting: Internal Medicine

## 2016-03-18 ENCOUNTER — Encounter (HOSPITAL_COMMUNITY): Payer: Self-pay

## 2016-03-18 DIAGNOSIS — N2581 Secondary hyperparathyroidism of renal origin: Secondary | ICD-10-CM | POA: Diagnosis present

## 2016-03-18 DIAGNOSIS — E1122 Type 2 diabetes mellitus with diabetic chronic kidney disease: Secondary | ICD-10-CM | POA: Diagnosis not present

## 2016-03-18 DIAGNOSIS — Z91011 Allergy to milk products: Secondary | ICD-10-CM

## 2016-03-18 DIAGNOSIS — D696 Thrombocytopenia, unspecified: Secondary | ICD-10-CM | POA: Diagnosis not present

## 2016-03-18 DIAGNOSIS — Z886 Allergy status to analgesic agent status: Secondary | ICD-10-CM

## 2016-03-18 DIAGNOSIS — Z79899 Other long term (current) drug therapy: Secondary | ICD-10-CM

## 2016-03-18 DIAGNOSIS — Z992 Dependence on renal dialysis: Secondary | ICD-10-CM | POA: Diagnosis not present

## 2016-03-18 DIAGNOSIS — Z981 Arthrodesis status: Secondary | ICD-10-CM | POA: Diagnosis not present

## 2016-03-18 DIAGNOSIS — G9340 Encephalopathy, unspecified: Secondary | ICD-10-CM | POA: Diagnosis present

## 2016-03-18 DIAGNOSIS — D638 Anemia in other chronic diseases classified elsewhere: Secondary | ICD-10-CM | POA: Diagnosis not present

## 2016-03-18 DIAGNOSIS — I10 Essential (primary) hypertension: Secondary | ICD-10-CM | POA: Diagnosis present

## 2016-03-18 DIAGNOSIS — R633 Feeding difficulties: Secondary | ICD-10-CM | POA: Diagnosis present

## 2016-03-18 DIAGNOSIS — R509 Fever, unspecified: Secondary | ICD-10-CM | POA: Diagnosis not present

## 2016-03-18 DIAGNOSIS — G40909 Epilepsy, unspecified, not intractable, without status epilepticus: Secondary | ICD-10-CM | POA: Diagnosis not present

## 2016-03-18 DIAGNOSIS — R402421 Glasgow coma scale score 9-12, in the field [EMT or ambulance]: Secondary | ICD-10-CM | POA: Diagnosis not present

## 2016-03-18 DIAGNOSIS — I5032 Chronic diastolic (congestive) heart failure: Secondary | ICD-10-CM | POA: Diagnosis present

## 2016-03-18 DIAGNOSIS — E8889 Other specified metabolic disorders: Secondary | ICD-10-CM | POA: Diagnosis present

## 2016-03-18 DIAGNOSIS — N186 End stage renal disease: Secondary | ICD-10-CM | POA: Diagnosis present

## 2016-03-18 DIAGNOSIS — Z66 Do not resuscitate: Secondary | ICD-10-CM | POA: Diagnosis not present

## 2016-03-18 DIAGNOSIS — Z794 Long term (current) use of insulin: Secondary | ICD-10-CM | POA: Diagnosis not present

## 2016-03-18 DIAGNOSIS — D631 Anemia in chronic kidney disease: Secondary | ICD-10-CM | POA: Diagnosis present

## 2016-03-18 DIAGNOSIS — E1121 Type 2 diabetes mellitus with diabetic nephropathy: Secondary | ICD-10-CM | POA: Diagnosis present

## 2016-03-18 DIAGNOSIS — R569 Unspecified convulsions: Secondary | ICD-10-CM

## 2016-03-18 DIAGNOSIS — J9811 Atelectasis: Secondary | ICD-10-CM | POA: Diagnosis present

## 2016-03-18 DIAGNOSIS — I272 Other secondary pulmonary hypertension: Secondary | ICD-10-CM | POA: Diagnosis present

## 2016-03-18 DIAGNOSIS — I120 Hypertensive chronic kidney disease with stage 5 chronic kidney disease or end stage renal disease: Secondary | ICD-10-CM | POA: Diagnosis not present

## 2016-03-18 DIAGNOSIS — R4182 Altered mental status, unspecified: Secondary | ICD-10-CM | POA: Diagnosis not present

## 2016-03-18 DIAGNOSIS — N189 Chronic kidney disease, unspecified: Secondary | ICD-10-CM

## 2016-03-18 DIAGNOSIS — Z950 Presence of cardiac pacemaker: Secondary | ICD-10-CM | POA: Diagnosis not present

## 2016-03-18 DIAGNOSIS — F0390 Unspecified dementia without behavioral disturbance: Secondary | ICD-10-CM | POA: Diagnosis present

## 2016-03-18 DIAGNOSIS — Y844 Aspiration of fluid as the cause of abnormal reaction of the patient, or of later complication, without mention of misadventure at the time of the procedure: Secondary | ICD-10-CM

## 2016-03-18 DIAGNOSIS — D72819 Decreased white blood cell count, unspecified: Secondary | ICD-10-CM

## 2016-03-18 LAB — BLOOD CULTURE ID PANEL (REFLEXED)
Acinetobacter baumannii: NOT DETECTED
CANDIDA ALBICANS: NOT DETECTED
CANDIDA GLABRATA: NOT DETECTED
CANDIDA PARAPSILOSIS: NOT DETECTED
Candida krusei: NOT DETECTED
Candida tropicalis: NOT DETECTED
Carbapenem resistance: NOT DETECTED
ENTEROBACTER CLOACAE COMPLEX: NOT DETECTED
ENTEROBACTERIACEAE SPECIES: NOT DETECTED
ENTEROCOCCUS SPECIES: NOT DETECTED
ESCHERICHIA COLI: NOT DETECTED
Haemophilus influenzae: NOT DETECTED
KLEBSIELLA PNEUMONIAE: NOT DETECTED
Klebsiella oxytoca: NOT DETECTED
Listeria monocytogenes: NOT DETECTED
METHICILLIN RESISTANCE: DETECTED — AB
Neisseria meningitidis: NOT DETECTED
PSEUDOMONAS AERUGINOSA: NOT DETECTED
Proteus species: NOT DETECTED
STAPHYLOCOCCUS AUREUS BCID: NOT DETECTED
STREPTOCOCCUS AGALACTIAE: NOT DETECTED
STREPTOCOCCUS PNEUMONIAE: NOT DETECTED
Serratia marcescens: NOT DETECTED
Staphylococcus species: DETECTED — AB
Streptococcus pyogenes: NOT DETECTED
Streptococcus species: NOT DETECTED
VANCOMYCIN RESISTANCE: NOT DETECTED

## 2016-03-18 LAB — URINE MICROSCOPIC-ADD ON

## 2016-03-18 LAB — URINALYSIS, ROUTINE W REFLEX MICROSCOPIC
Bilirubin Urine: NEGATIVE
GLUCOSE, UA: 500 mg/dL — AB
KETONES UR: NEGATIVE mg/dL
LEUKOCYTES UA: NEGATIVE
Nitrite: NEGATIVE
PH: 7.5 (ref 5.0–8.0)
Protein, ur: 100 mg/dL — AB
SPECIFIC GRAVITY, URINE: 1.014 (ref 1.005–1.030)

## 2016-03-18 LAB — COMPREHENSIVE METABOLIC PANEL
ALBUMIN: 3.2 g/dL — AB (ref 3.5–5.0)
ALT: 33 U/L (ref 17–63)
AST: 50 U/L — AB (ref 15–41)
Alkaline Phosphatase: 70 U/L (ref 38–126)
Anion gap: 10 (ref 5–15)
BUN: 39 mg/dL — AB (ref 6–20)
CHLORIDE: 101 mmol/L (ref 101–111)
CO2: 26 mmol/L (ref 22–32)
Calcium: 9.1 mg/dL (ref 8.9–10.3)
Creatinine, Ser: 5.95 mg/dL — ABNORMAL HIGH (ref 0.61–1.24)
GFR calc Af Amer: 9 mL/min — ABNORMAL LOW (ref >60)
GFR calc non Af Amer: 8 mL/min — ABNORMAL LOW (ref >60)
GLUCOSE: 223 mg/dL — AB (ref 65–99)
POTASSIUM: 5 mmol/L (ref 3.5–5.1)
Sodium: 137 mmol/L (ref 135–145)
Total Bilirubin: 0.9 mg/dL (ref 0.3–1.2)
Total Protein: 6.9 g/dL (ref 6.5–8.1)

## 2016-03-18 LAB — CBC WITH DIFFERENTIAL/PLATELET
Basophils Absolute: 0 10*3/uL (ref 0.0–0.1)
Basophils Relative: 1 %
EOS PCT: 0 %
Eosinophils Absolute: 0 10*3/uL (ref 0.0–0.7)
HCT: 41.1 % (ref 39.0–52.0)
Hemoglobin: 12.9 g/dL — ABNORMAL LOW (ref 13.0–17.0)
LYMPHS ABS: 0.7 10*3/uL (ref 0.7–4.0)
LYMPHS PCT: 21 %
MCH: 31.4 pg (ref 26.0–34.0)
MCHC: 31.4 g/dL (ref 30.0–36.0)
MCV: 100 fL (ref 78.0–100.0)
MONO ABS: 0.4 10*3/uL (ref 0.1–1.0)
Monocytes Relative: 11 %
Neutro Abs: 2.4 10*3/uL (ref 1.7–7.7)
Neutrophils Relative %: 68 %
PLATELETS: 33 10*3/uL — AB (ref 150–400)
RBC: 4.11 MIL/uL — AB (ref 4.22–5.81)
RDW: 16 % — ABNORMAL HIGH (ref 11.5–15.5)
WBC: 3.5 10*3/uL — AB (ref 4.0–10.5)

## 2016-03-18 LAB — PHOSPHORUS: Phosphorus: 3.3 mg/dL (ref 2.5–4.6)

## 2016-03-18 LAB — I-STAT CG4 LACTIC ACID, ED
LACTIC ACID, VENOUS: 1.8 mmol/L (ref 0.5–1.9)
Lactic Acid, Venous: 0.61 mmol/L (ref 0.5–1.9)

## 2016-03-18 LAB — MAGNESIUM: Magnesium: 2 mg/dL (ref 1.7–2.4)

## 2016-03-18 LAB — CBG MONITORING, ED: Glucose-Capillary: 203 mg/dL — ABNORMAL HIGH (ref 65–99)

## 2016-03-18 MED ORDER — ACETAMINOPHEN 650 MG RE SUPP
650.0000 mg | Freq: Once | RECTAL | Status: AC
  Administered 2016-03-18: 650 mg via RECTAL
  Filled 2016-03-18: qty 1

## 2016-03-18 MED ORDER — VANCOMYCIN HCL IN DEXTROSE 1-5 GM/200ML-% IV SOLN: 1000.0000 mg | Freq: Once | INTRAVENOUS | Status: DC

## 2016-03-18 MED ORDER — LORATADINE 10 MG PO TABS
10.0000 mg | ORAL_TABLET | Freq: Every day | ORAL | Status: DC
  Administered 2016-03-18 – 2016-03-21 (×4): 10 mg via ORAL
  Filled 2016-03-18 (×5): qty 1

## 2016-03-18 MED ORDER — SODIUM CHLORIDE 0.9 % IV SOLN
INTRAVENOUS | Status: DC
  Administered 2016-03-18: 09:00:00 via INTRAVENOUS

## 2016-03-18 MED ORDER — VANCOMYCIN HCL IN DEXTROSE 1-5 GM/200ML-% IV SOLN
1000.0000 mg | INTRAVENOUS | Status: DC
  Administered 2016-03-19: 1000 mg via INTRAVENOUS
  Filled 2016-03-18: qty 200

## 2016-03-18 MED ORDER — PIPERACILLIN-TAZOBACTAM 3.375 G IVPB 30 MIN
3.3750 g | Freq: Once | INTRAVENOUS | Status: DC
Start: 2016-03-18 — End: 2016-03-18

## 2016-03-18 MED ORDER — CALCIUM ACETATE (PHOS BINDER) 667 MG PO CAPS
2001.0000 mg | ORAL_CAPSULE | Freq: Three times a day (TID) | ORAL | Status: DC
  Administered 2016-03-18 – 2016-03-21 (×6): 2001 mg via ORAL
  Filled 2016-03-18 (×8): qty 3

## 2016-03-18 MED ORDER — LACOSAMIDE 50 MG PO TABS
150.0000 mg | ORAL_TABLET | Freq: Two times a day (BID) | ORAL | Status: DC
  Administered 2016-03-18 – 2016-03-21 (×6): 150 mg via ORAL
  Filled 2016-03-18 (×7): qty 3

## 2016-03-18 MED ORDER — AMLODIPINE BESYLATE 5 MG PO TABS
5.0000 mg | ORAL_TABLET | Freq: Every day | ORAL | Status: DC
  Administered 2016-03-18 – 2016-03-19 (×2): 5 mg via ORAL
  Filled 2016-03-18 (×3): qty 1

## 2016-03-18 MED ORDER — FOLIC ACID 1 MG PO TABS
1.0000 mg | ORAL_TABLET | Freq: Every day | ORAL | Status: DC
  Administered 2016-03-18 – 2016-03-21 (×4): 1 mg via ORAL
  Filled 2016-03-18 (×4): qty 1

## 2016-03-18 MED ORDER — RENA-VITE PO TABS
1.0000 | ORAL_TABLET | Freq: Every day | ORAL | Status: DC
  Administered 2016-03-18 – 2016-03-19 (×2): 1 via ORAL
  Filled 2016-03-18 (×3): qty 1

## 2016-03-18 MED ORDER — CALCITRIOL 0.5 MCG PO CAPS
1.0000 ug | ORAL_CAPSULE | ORAL | Status: DC
  Administered 2016-03-19: 1 ug via ORAL

## 2016-03-18 MED ORDER — PIPERACILLIN-TAZOBACTAM IN DEX 2-0.25 GM/50ML IV SOLN
2.2500 g | Freq: Three times a day (TID) | INTRAVENOUS | Status: DC
  Administered 2016-03-18 – 2016-03-21 (×10): 2.25 g via INTRAVENOUS
  Filled 2016-03-18 (×14): qty 50

## 2016-03-18 MED ORDER — GABAPENTIN 100 MG PO CAPS
100.0000 mg | ORAL_CAPSULE | Freq: Three times a day (TID) | ORAL | Status: DC
  Administered 2016-03-18 – 2016-03-21 (×8): 100 mg via ORAL
  Filled 2016-03-18 (×9): qty 1

## 2016-03-18 MED ORDER — DIVALPROEX SODIUM ER 500 MG PO TB24
500.0000 mg | ORAL_TABLET | Freq: Every day | ORAL | Status: DC
  Administered 2016-03-18 – 2016-03-21 (×4): 500 mg via ORAL
  Filled 2016-03-18 (×4): qty 1

## 2016-03-18 MED ORDER — VANCOMYCIN HCL 10 G IV SOLR
1750.0000 mg | Freq: Once | INTRAVENOUS | Status: AC
  Administered 2016-03-18: 1750 mg via INTRAVENOUS
  Filled 2016-03-18: qty 1750

## 2016-03-18 MED ORDER — SODIUM CHLORIDE 0.9 % IV SOLN
INTRAVENOUS | Status: AC
  Administered 2016-03-18: 09:00:00 via INTRAVENOUS

## 2016-03-18 MED ORDER — NEPRO/CARBSTEADY PO LIQD: 237.0000 mL | Freq: Two times a day (BID) | ORAL | Status: DC

## 2016-03-18 MED ORDER — SENNOSIDES-DOCUSATE SODIUM 8.6-50 MG PO TABS
1.0000 | ORAL_TABLET | Freq: Every evening | ORAL | Status: DC
  Administered 2016-03-18 – 2016-03-20 (×3): 1 via ORAL
  Filled 2016-03-18 (×3): qty 1

## 2016-03-18 NOTE — Progress Notes (Signed)
Pharmacy Antibiotic Note  [name] is a 82 y.o. male admitted on 03/18/2016 with sepsis.  Pharmacy has been consulted for Vancomycin and Zosyn dosing. Noted pt with ESRD - HD as o/p on M/W/F  Plan: Vancomycin 1750mg  IV now then 1000mg  IV qHD Zosyn 2.25gm IV q8h Will f/u micro data, HD schedule, and pt's clinical condition Vanc pre-HD level prn  Height: 6' (182.9 cm) Weight: 181 lb (82.101 kg) IBW/kg (Calculated) : 77.6  Temp (24hrs), Avg:100.3 F (37.9 C), Min:98.2 F (36.8 C), Max:102.6 F (39.2 C)   Recent Labs Lab 03/16/16 0214 03/16/16 0220 03/16/16 0512 03/17/16 0343 03/18/16 0239 03/18/16 0244 03/18/16 0525  WBC 3.1*  --   --  2.4* 3.5*  --   --   CREATININE 7.11*  --   --  8.26* 5.95*  --   --   LATICACIDVEN  --  1.20 0.54  --   --  1.80 0.61    Estimated Creatinine Clearance: 10.5 mL/min (by C-G formula based on Cr of 5.95).    Allergies  Allergen Reactions  . Aspirin Other (See Comments)    Bleeding   . Lactose Intolerance (Gi) Diarrhea    Antimicrobials this admission: 7/13 Vanc >>  7/13 Zosyn >>   Dose adjustments this admission: n/a  Microbiology results: 7/13 BCx x2:  7/13 UCx:    Thank you for allowing pharmacy to be a part of this patient's care.  8/13, PharmD, BCPS Clinical pharmacist, pager [phone] 03/18/2016 6:04 AM

## 2016-03-18 NOTE — Progress Notes (Signed)
   03/18/16 1423  Clinical Encounter Type  Visited With Patient and family together  Visit Type Initial  Referral From Nurse  Consult/Referral To Chaplain  Spiritual Encounters  Spiritual Needs Emotional  Stress Factors  Patient Stress Factors Health changes;Loss of control  Family Stress Factors Health changes;Lack of knowledge  Chaplain visit made, patient is in good spirits, spouse at bedside, provided emotional support and spiritual presence, advised that further support services are available 24/7 upon request.

## 2016-03-18 NOTE — ED Notes (Signed)
Called [facility]link to activate Code Sepsis  

## 2016-03-18 NOTE — Evaluation (Signed)
Clinical/Bedside Swallow Evaluation Patient Details  Name: [name] MRN: [id number] Date of Birth: 1932-06-03  Today's Date: 03/18/2016 Time: SLP Start Time (ACUTE ONLY): 1424 SLP Stop Time (ACUTE ONLY): 1433 SLP Time Calculation (min) (ACUTE ONLY): 9 min  Past Medical History:  Past Medical History  Diagnosis Date  . Hypertension   . Diabetes mellitus without complication (HCC)   . Arthritis   . History of blood transfusion   . GI bleed   . Seizure (HCC)   . Subdural hematoma (HCC) 05/2015    from fall  . Sleep apnea     wears CPAP n/c  . Pulmonary hypertension (HCC)     severe pulmonary HTN by 01/14/13 echo  . ESRD (end stage renal disease) on dialysis [facility])     "MWF" (03/04/2016)   Past Surgical History:  Past Surgical History  Procedure Laterality Date  . Esophagogastroduodenoscopy  10/03/2012    Procedure: ESOPHAGOGASTRODUODENOSCOPY (EGD);  Surgeon: Theda Belfast, MD;  Location: [facility] ENDOSCOPY;  Service: Endoscopy;  Laterality: N/A;  . Colonoscopy  10/04/2012    Procedure: COLONOSCOPY;  Surgeon: Theda Belfast, MD;  Location: [facility] ENDOSCOPY;  Service: Endoscopy;  Laterality: N/A;  . Colonoscopy with esophagogastroduodenoscopy (egd) Left 11/30/2012    Procedure: COLONOSCOPY WITH ESOPHAGOGASTRODUODENOSCOPY (EGD);  Surgeon: Theda Belfast, MD;  Location: [facility] ENDOSCOPY;  Service: Endoscopy;  Laterality: Left;  . Spine surgery  2004    back fusion/ [facility] Penumalli,MD  . Retinal laser procedure Right 04/2015  . Av fistula placement Left 09/09/2015    Procedure: INSERTION OF ARTERIOVENOUS  GORE-TEX GRAFT Left  ARM;  Surgeon: Sherren Kerns, MD;  Location: [facility] OR;  Service: Vascular;  Laterality: Left;  . Insertion of dialysis catheter Right 11/27/2015    Procedure: INSERTION OF DIALYSIS CATHETER - Right Internal Jugular Placement;  Surgeon: Chuck Hint, MD;  Location: [facility] OR;  Service: Vascular;  Laterality: Right;  . Ep implantable device N/A 12/12/2015    Procedure: Pacemaker  Implant;  Surgeon: Will Jorja Loa, MD;  Location: [facility] INVASIVE CV LAB;  Service: Cardiovascular;  Laterality: N/A;  . Peripheral vascular catheterization Left 01/27/2016    Procedure: Fistulagram;  Surgeon: Nada Libman, MD;  Location: [facility] INVASIVE CV LAB;  Service: Cardiovascular;  Laterality: Left;  upper arm  . Ligation arteriovenous gortex graft Left 01/27/2016    Procedure: LEFT LIGATION ARTERIOVENOUS GORTEX GRAFT;  Surgeon: Chuck Hint, MD;  Location: [facility] OR;  Service: Vascular;  Laterality: Left;   HPI:  82 year old male with history of subdural hematoma, seizure, ESRD on HD MWF, DM and HTN amongst other medical problems. Patient was discharged from this hospital after in hospital stay and management for encephalopathy and possible seizure. Patient was seen by Neurologist, and anti-seizure medication adjusted. Patient was brought to the hospital by the family with concerns for fever and altered mentation. Decreased level of consciousness is reported, and patient is said to have urinated and defecated on himself which is out of character for the patient. On admission, UA is non revealing, and CXR revealed possible left lower lobe atelectasis. No history of dysphagia in the chart.    Assessment / Plan / Recommendation Clinical Impression  Pt demonstrates normal swallow function. No SLP f/u needed, continue current diet.     Aspiration Risk  Mild aspiration risk    Diet Recommendation Regular;Thin liquid   Liquid Administration via: Cup;Straw Medication Administration: Whole meds with liquid Supervision: Patient able to self feed Postural Changes: Seated  upright at 90 degrees    Other  Recommendations Oral Care Recommendations: Oral care BID   Follow up Recommendations  None    Frequency and Duration            Prognosis        Swallow Study   General HPI: 82 year old male with history of subdural hematoma, seizure, ESRD on HD MWF, DM and HTN amongst other  medical problems. Patient was discharged from this hospital after in hospital stay and management for encephalopathy and possible seizure. Patient was seen by Neurologist, and anti-seizure medication adjusted. Patient was brought to the hospital by the family with concerns for fever and altered mentation. Decreased level of consciousness is reported, and patient is said to have urinated and defecated on himself which is out of character for the patient. On admission, UA is non revealing, and CXR revealed possible left lower lobe atelectasis. No history of dysphagia in the chart.  Type of Study: Bedside Swallow Evaluation Previous Swallow Assessment: see HPI Diet Prior to this Study: Regular;Thin liquids Temperature Spikes Noted: No Respiratory Status: Room air History of Recent Intubation: No Behavior/Cognition: Alert;Cooperative;Pleasant mood Oral Cavity Assessment: Within Functional Limits Oral Care Completed by SLP: No Oral Cavity - Dentition: Adequate natural dentition Vision: Functional for self-feeding Patient Positioning: Upright in bed Baseline Vocal Quality: Normal Volitional Cough: Strong Volitional Swallow: Able to elicit    Oral/Motor/Sensory Function     Ice Chips     Thin Liquid Thin Liquid: Within functional limits Presentation: Cup;Straw;Self Fed    Nectar Thick Nectar Thick Liquid: Not tested   Honey Thick Honey Thick Liquid: Not tested   Puree Puree: Within functional limits   Solid   GO   Solid: Within functional limits       [facility], MA CCC-SLP [phone]  Claudine Mouton 03/18/2016,2:36 PM

## 2016-03-18 NOTE — ED Notes (Signed)
EMS attempted to do a stroke screen on the pt but the patient would not follow commands.

## 2016-03-18 NOTE — Progress Notes (Signed)
New Admission Note:  Arrival Method: ED stretcher Mental Orientation: alert to self only Telemetry: Box 25  Assessment: Completed Skin: Skin warm dry and intact IV: NSL Pain: Denies Tubes: N/A Safety Measures: N/A Admission: Completed [address] [place] Orientation: n/a Family: None available  Orders have been reviewed and implemented. Will continue to monitor the patient. Call light has been placed within reach and bed alarm has been activated.   Guilford Shi BSN, RN  Phone Number: [phone]

## 2016-03-18 NOTE — ED Notes (Signed)
Antibiotics have to be delivered from pharmacy.

## 2016-03-18 NOTE — ED Notes (Signed)
Pt was incontinent of bowel. Pts wife states that this is not normal for him.

## 2016-03-18 NOTE — ED Notes (Signed)
Per [facility]: Pt is from home. Pts wife stated that they were sitting on the bed, and then he would not respond to her. When EMS arrived the pt was alert to verbal stimuli, mostly non verbal. Pt recently here for a seizure, and behavior is similar to his last seizure. The wife stated that she doesn't think that the pt had a seizure, but the pts son stated that when the pt does have a seizure he "stiffens up and then passes out". Pt had dialysis on Wednesday, pt believes that he had a full session.

## 2016-03-18 NOTE — Progress Notes (Signed)
  PHARMACY - PHYSICIAN COMMUNICATION CRITICAL VALUE ALERT - BLOOD CULTURE IDENTIFICATION (BCID)  Results for orders placed or performed during the hospital encounter of 03/16/16  Blood Culture ID Panel (Reflexed) (Collected: 03/16/2016  2:14 AM)  Result Value Ref Range   Enterococcus species NOT DETECTED NOT DETECTED   Vancomycin resistance NOT DETECTED NOT DETECTED   Listeria monocytogenes NOT DETECTED NOT DETECTED   Staphylococcus species DETECTED (A) NOT DETECTED   Staphylococcus aureus NOT DETECTED NOT DETECTED   Methicillin resistance DETECTED (A) NOT DETECTED   Streptococcus species NOT DETECTED NOT DETECTED   Streptococcus agalactiae NOT DETECTED NOT DETECTED   Streptococcus pneumoniae NOT DETECTED NOT DETECTED   Streptococcus pyogenes NOT DETECTED NOT DETECTED   Acinetobacter baumannii NOT DETECTED NOT DETECTED   Enterobacteriaceae species NOT DETECTED NOT DETECTED   Enterobacter cloacae complex NOT DETECTED NOT DETECTED   Escherichia coli NOT DETECTED NOT DETECTED   Klebsiella oxytoca NOT DETECTED NOT DETECTED   Klebsiella pneumoniae NOT DETECTED NOT DETECTED   Proteus species NOT DETECTED NOT DETECTED   Serratia marcescens NOT DETECTED NOT DETECTED   Carbapenem resistance NOT DETECTED NOT DETECTED   Haemophilus influenzae NOT DETECTED NOT DETECTED   Neisseria meningitidis NOT DETECTED NOT DETECTED   Pseudomonas aeruginosa NOT DETECTED NOT DETECTED   Candida albicans NOT DETECTED NOT DETECTED   Candida glabrata NOT DETECTED NOT DETECTED   Candida krusei NOT DETECTED NOT DETECTED   Candida parapsilosis NOT DETECTED NOT DETECTED   Candida tropicalis NOT DETECTED NOT DETECTED    Name of physician (or Provider) Contacted: [place]  Changes to prescribed antibiotics required: None - already on Vancomycin + Zosyn. Results indicate possible contaminant or pathogen given presence of ICD - further work-up to r/o ICD or lead infection should be considered with TTE/TEE.  Georgina Pillion, PharmD, BCPS Clinical Pharmacist Pager: [phone] 03/18/2016 8:24 AM

## 2016-03-18 NOTE — Progress Notes (Signed)
Note: discussed with Dr. Jerral Ralph (saw him during last admission) and per neuro recommendations he should be on Depakote ER 500mg  daily and Vimpat 150mg  BID.   Orders changed.

## 2016-03-18 NOTE — Consult Note (Signed)
[facility] Renal Consultation Note    Indication for Consultation:  Management of ESRD/hemodialysis; anemia, hypertension/volume and secondary hyperparathyroidism PCP: [name], DO  HPI: [name] is a 82 y.o. male with ESRD on MWF HD at [facility] with PMHx significant for DM, HTN, GIB, seizure disorder,  subdural hematoma from a fall, access problems including 100% of left SCV with collaterals and chronic left upper extremity swelling, pseudomonas bacteremia Feb 2017, with removal of TDC and short term use of LUE AVGG.  AVGG has been deemed failed and he is dialyzing with a TDC.  He was discharged yesterday following an OBS admission for post ictal encephalopathy.  Dialysis Wednesday at [facility] was uneventful. He was kept even.  Antiseizure meds were adjusted.  Last evening, he was noted to have diminished LOC by his family,with AMS, and stool incontinenc per admission H & P.  The patient cannot recall any details of yesterday. His wife is also a very poor historian with short term memory problems.   He was brought to the ED for admission.  Temp was 102.6 BP elevated 170s - 190s much higher than usual. WBC was 2.4 on 7/12 had increased to 3.5 with normal diff K was 5 Glu 223,lactic acid 1.8, platelets down to 33 from 63 about 2 weeks ago. CXR was unremarkable as was U/A.except 6-30 RBC, glu 500.    Today, he is alert and eating lunch without complaints.  Past Medical History  Diagnosis Date  . Hypertension   . Diabetes mellitus without complication (HCC)   . Arthritis   . History of blood transfusion   . GI bleed   . Seizure (HCC)   . Subdural hematoma (HCC) 05/2015    from fall  . Sleep apnea     wears CPAP n/c  . Pulmonary hypertension (HCC)     severe pulmonary HTN by 01/14/13 echo  . ESRD (end stage renal disease) on dialysis [facility])     "MWF" (03/04/2016)   Past Surgical History  Procedure Laterality Date  . Esophagogastroduodenoscopy  10/03/2012    Procedure:  ESOPHAGOGASTRODUODENOSCOPY (EGD);  Surgeon: Theda Belfast, MD;  Location: [facility] ENDOSCOPY;  Service: Endoscopy;  Laterality: N/A;  . Colonoscopy  10/04/2012    Procedure: COLONOSCOPY;  Surgeon: Theda Belfast, MD;  Location: [facility] ENDOSCOPY;  Service: Endoscopy;  Laterality: N/A;  . Colonoscopy with esophagogastroduodenoscopy (egd) Left 11/30/2012    Procedure: COLONOSCOPY WITH ESOPHAGOGASTRODUODENOSCOPY (EGD);  Surgeon: Theda Belfast, MD;  Location: [facility] ENDOSCOPY;  Service: Endoscopy;  Laterality: Left;  . Spine surgery  2004    back fusion/ [facility] Penumalli,MD  . Retinal laser procedure Right 04/2015  . Av fistula placement Left 09/09/2015    Procedure: INSERTION OF ARTERIOVENOUS  GORE-TEX GRAFT Left  ARM;  Surgeon: Sherren Kerns, MD;  Location: [facility] OR;  Service: Vascular;  Laterality: Left;  . Insertion of dialysis catheter Right 11/27/2015    Procedure: INSERTION OF DIALYSIS CATHETER - Right Internal Jugular Placement;  Surgeon: Chuck Hint, MD;  Location: [facility] OR;  Service: Vascular;  Laterality: Right;  . Ep implantable device N/A 12/12/2015    Procedure: Pacemaker Implant;  Surgeon: Will Jorja Loa, MD;  Location: [facility] INVASIVE CV LAB;  Service: Cardiovascular;  Laterality: N/A;  . Peripheral vascular catheterization Left 01/27/2016    Procedure: Fistulagram;  Surgeon: Nada Libman, MD;  Location: [facility] INVASIVE CV LAB;  Service: Cardiovascular;  Laterality: Left;  upper arm  . Ligation arteriovenous gortex graft Left 01/27/2016  Procedure: LEFT LIGATION ARTERIOVENOUS GORTEX GRAFT;  Surgeon: Chuck Hint, MD;  Location: [facility] OR;  Service: Vascular;  Laterality: Left;   Family History  Problem Relation Age of Onset  . Sudden death Mother   . Sudden death Father    Social History:  reports that he has never smoked. He has never used smokeless tobacco. He reports that he does not drink alcohol or use illicit drugs. Allergies  Allergen Reactions  . Aspirin Other (See Comments)     Bleeding   . Lactose Intolerance (Gi) Diarrhea   Prior to Admission medications   Medication Sig Start Date End Date Taking? Authorizing Provider  amLODipine (NORVASC) 5 MG tablet Take 1 tablet (5 mg total) by mouth at bedtime. 02/26/16   [name] L [name], DO  calcitRIOL (ROCALTROL) 0.5 MCG capsule Take 2 capsules (1 mcg total) by mouth every Monday, Wednesday, and Friday with hemodialysis. 02/26/16   [name] L [name], DO  calcium acetate (PHOSLO) 667 MG capsule Take 3 capsules by mouth three times a day 02/26/16   [name] L [name], DO  divalproex (DEPAKOTE ER) 500 MG 24 hr tablet Take 1 tablet (500 mg total) by mouth every 12 (twelve) hours. Patient taking differently: Take 500 mg by mouth daily.  03/17/16   Shanker Levora Dredge, MD  fexofenadine (ALLEGRA) 180 MG tablet Take 1 tablet (180 mg total) by mouth daily as needed for allergies or rhinitis. 02/26/16   [name] L [name], DO  folic acid (FOLVITE) 1 MG tablet Take 1 tablet (1 mg total) by mouth daily. 02/26/16   [name] L [name], DO  gabapentin (NEURONTIN) 100 MG capsule Take 1 capsule (100 mg total) by mouth 3 (three) times daily. 02/26/16   [name] L [name], DO  glucose blood (ACCU-CHEK AVIVA PLUS) test strip 1 each by Other route 3 (three) times daily. Use as instructed 01/21/16   [name] L [name], DO  HUMALOG KWIKPEN 100 UNIT/ML KiwkPen Inject 5 Units into the skin See admin instructions. Per sliding scale 02/19/16   Historical Provider, MD  Insulin Pen Needle 31G X 8 MM MISC Use as directed with insulin pens 03/01/16   [name] L [name], DO  Lacosamide 150 MG TABS Take 1 tablet (150 mg total) by mouth 2 (two) times daily. 03/17/16   Shanker Levora Dredge, MD  multivitamin (RENA-VIT) TABS tablet Take 1 tablet by mouth at bedtime. 02/26/16   [name] L [name], DO  pantoprazole (PROTONIX) 40 MG tablet Take 1 tablet (40 mg total) by mouth 2 (two) times daily. 02/26/16   [name] L [name], DO  sennosides-docusate sodium (SENOKOT-S) 8.6-50 MG tablet Take 1 tablet by mouth every  evening. 02/26/16   [name] L [name], DO   Current Facility-Administered Medications  Medication Dose Route Frequency Provider Last Rate Last Dose  . 0.9 %  sodium chloride infusion   Intravenous STAT Dione Booze, MD 100 mL/hr at 03/18/16 0834    . 0.9 %  sodium chloride infusion   Intravenous Continuous Barnetta Chapel, MD      . amLODipine (NORVASC) tablet 5 mg  5 mg Oral QHS Barnetta Chapel, MD      . Melene Muller ON 03/19/2016] calcitRIOL (ROCALTROL) capsule 1 mcg  1 mcg Oral Q M,W,F-HD Barnetta Chapel, MD      . calcium acetate (PHOSLO) capsule 2,001 mg  2,001 mg Oral TID WC Barnetta Chapel, MD      . divalproex (DEPAKOTE ER) 24 hr tablet 500 mg  500 mg Oral  Daily Barnetta Chapel, MD      . folic acid (FOLVITE) tablet 1 mg  1 mg Oral Daily Barnetta Chapel, MD      . gabapentin (NEURONTIN) capsule 100 mg  100 mg Oral TID Barnetta Chapel, MD      . lacosamide (VIMPAT) tablet 150 mg  150 mg Oral BID Barnetta Chapel, MD      . loratadine (CLARITIN) tablet 10 mg  10 mg Oral Daily Barnetta Chapel, MD      . multivitamin (RENA-VIT) tablet 1 tablet  1 tablet Oral QHS Barnetta Chapel, MD      . piperacillin-tazobactam (ZOSYN) IVPB 2.25 g  2.25 g Intravenous Q8H Titus Mould, [facility]   Stopped at 03/18/16 0343  . senna-docusate (Senokot-S) tablet 1 tablet  1 tablet Oral QPM Barnetta Chapel, MD      . Melene Muller ON 03/19/2016] vancomycin (VANCOCIN) IVPB 1000 mg/200 mL premix  1,000 mg Intravenous Q M,W,F-HD Titus Mould, RPH       Facility-Administered Medications Ordered in Other Encounters  Medication Dose Route Frequency Provider Last Rate Last Dose  . Chlorhexidine Gluconate Cloth 2 % PADS 6 each  6 each Topical Once Sherren Kerns, MD       Labs: Basic Metabolic Panel:  Recent Labs Lab 03/16/16 0214 03/17/16 0343 03/18/16 0239 03/18/16 0841  NA 136 139 137  --   K 4.0 4.4 5.0  --   CL 101 104 101  --   CO2 24 24 26   --   GLUCOSE 163* 187* 223*  --   BUN 50* 63*  39*  --   CREATININE 7.11* 8.26* 5.95*  --   CALCIUM 9.5 9.2 9.1  --   PHOS  --   --   --  3.3   Liver Function Tests:  Recent Labs Lab 03/16/16 0214 03/18/16 0239  AST 30 50*  ALT 22 33  ALKPHOS 68 70  BILITOT 0.8 0.9  PROT 7.2 6.9  ALBUMIN 3.4* 3.2*    Recent Labs Lab 03/16/16 0934  AMMONIA 36*   CBC:  Recent Labs Lab 03/16/16 0214 03/17/16 0343 03/18/16 0239  WBC 3.1* 2.4* 3.5*  NEUTROABS 2.0  --  2.4  HGB 12.0* 11.7* 12.9*  HCT 36.4* 35.9* 41.1  MCV 98.4 98.9 100.0  PLT 54* 45* 33*   Cardiac Enzymes:  Recent Labs Lab 03/16/16 0934  CKTOTAL 83   CBG:  Recent Labs Lab 03/16/16 1116 03/16/16 1640 03/16/16 1957 03/17/16 1148 03/18/16 0226  GLUCAP 138* 130* 115* 181* 203*   Studies/Results: Dg Chest Port 1 View  03/18/2016  CLINICAL DATA:  Fever and altered mental status tonight. EXAM: PORTABLE CHEST 1 VIEW COMPARISON:  03/16/2016 FINDINGS: Cardiac pacemaker. Right central venous catheter with tip over the low SVC region. No pneumothorax. Shallow inspiration with atelectasis in the left lung base. No focal consolidation. No blunting of costophrenic angles. Normal heart size and pulmonary vascularity. Calcification of the aorta. Degenerative changes in the shoulders. IMPRESSION: Appliances appear in satisfactory location. Shallow inspiration with atelectasis in the left lung base. Electronically Signed   By: 05/17/2016 M.D.   On: 03/18/2016 03:38    ROS: As per HPI otherwise negative.  Physical Exam: Filed Vitals:   03/18/16 0500 03/18/16 0520 03/18/16 0522 03/18/16 0759  BP: 191/92 169/69  129/41  Pulse: 80 79  70  Temp:   101.1 F (38.4 C) 98.5 F (36.9 C)  TempSrc:  Oral Oral  Resp: 20 17  16   Height:      Weight:      SpO2: 95% 95%  94%     General: WDWN NAD Head: NCAT sclera not icteric MMM Neck: Supple.  Lungs: CTA bilaterally without wheezes, rales, or rhonchi. Breathing is unlabored. Heart: RRR with S1 S2.  Abdomen: soft  NT + BS M-S:  Normal for age Lower extremities:without edema or ischemic changes, no open wounds  Neuro: Alert feeding self; Moves all extremities spontaneously. Psych:  Responds to questions appropriately with usual affect. Dialysis Access:right IJ - nontender no erythema or drainage at exit site  Dialysis Orders:  East 3.75 180 400/800 EDW 90 2 K 2.25 Ca profile 4  right IJ active NO heparin except in catheter venofer 50/week 7/5, Mircera 100  q 2 weeks - last 7/5 Calcitriol 0.25 Recent labs: hgb 11.2 41% sat iPTH 251 Corr Ca 10.2 P OK  Assessment/Plan: 1. Acute febrile illness with exncephalopathy-  - high risk for infection given TDC and recent hospitalization, also has PM - has been pancultured and placed on empiric Vanc and Zosyn; AMS seems back to baseline this am. BC from 7/11 prior OBS admission now + for MRSA- on appropriate coverage 2. ESRD -  MWF - HD yesterday while during OBS admission was uneventful 3. Hypertension/volume  - BP variable - no overt volume on exame-no volume on CXR  4. Anemia  - 12.9 - Hold redosing ESA  And Fe for now 5. Metabolic bone disease -  Controlled - Continue calcitriol /binders 6. Nutrition -change to renal carb mod diet - due to high K in heart healthy diet /vits/nepro 7. Seizure disorder 8. DM 9. Dementia\ 10. Thrombocytopenia - chronic but lower than usual, likely due to acute illness - not on heparin except in catheter - repeat plts with HD Friday  [name], PA-C [facility] Beeper [phone] 03/18/2016, 11:57 AM   Pt seen, examined and agree w A/P as above.  [name] [employer] pager [phone]    cell [phone] 03/18/2016, 3:50 PM
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
## 2016-03-18 NOTE — H&P (Signed)
History and Physical  Garreth Flatten RFV:[id number] DOB: 19-Jun-1932 DOA: 03/18/2016  Referring physician: ER Physician PCP: [name], DO  Outpatient Specialists:    Patient coming from: Home  Chief Complaint: Fever and altered Mental status  HPI: 82 year old male with history of subdural hematoma, seizure, ESRD on HD MWF, DM and HTN amongst other medical problems. Patient was discharged from this hospital after in hospital stay and management for encephalopathy and possible seizure. Patient was seen by Neurologist, and anti-seizure medication adjusted. Patient was brought to the hospital by the family last night with concerns for fever and altered mentation. Decreased level of consciousness is reported, and patient is said to have urinated and defecated on himself which is out of character for the patient. On admission, UA is non revealing, and CXR revealed possible left lower lobe atelectasis. Due to patient's mental status, patient can not give any history. Patient will be admitted for further assessment and management.  ED Course: Pan cultured and started on antibiotics. Cautious fluid resuscitation started.  Pertinent labs: As in HPI. EKG: Independently reviewed.  Imaging: independently reviewed.   Review of Systems: Unobtainable. Refer to HPI.  Past Medical History  Diagnosis Date  . Hypertension   . Diabetes mellitus without complication (HCC)   . Arthritis   . History of blood transfusion   . GI bleed   . Seizure (HCC)   . Subdural hematoma (HCC) 05/2015    from fall  . Sleep apnea     wears CPAP n/c  . Pulmonary hypertension (HCC)     severe pulmonary HTN by 01/14/13 echo  . ESRD (end stage renal disease) on dialysis [facility])     "MWF" (03/04/2016)    Past Surgical History  Procedure Laterality Date  . Esophagogastroduodenoscopy  10/03/2012    Procedure: ESOPHAGOGASTRODUODENOSCOPY (EGD);  Surgeon: Theda Belfast, MD;  Location: [facility] ENDOSCOPY;  Service: Endoscopy;   Laterality: N/A;  . Colonoscopy  10/04/2012    Procedure: COLONOSCOPY;  Surgeon: Theda Belfast, MD;  Location: [facility] ENDOSCOPY;  Service: Endoscopy;  Laterality: N/A;  . Colonoscopy with esophagogastroduodenoscopy (egd) Left 11/30/2012    Procedure: COLONOSCOPY WITH ESOPHAGOGASTRODUODENOSCOPY (EGD);  Surgeon: Theda Belfast, MD;  Location: [facility] ENDOSCOPY;  Service: Endoscopy;  Laterality: Left;  . Spine surgery  2004    back fusion/ [facility] Penumalli,MD  . Retinal laser procedure Right 04/2015  . Av fistula placement Left 09/09/2015    Procedure: INSERTION OF ARTERIOVENOUS  GORE-TEX GRAFT Left  ARM;  Surgeon: Sherren Kerns, MD;  Location: [facility] OR;  Service: Vascular;  Laterality: Left;  . Insertion of dialysis catheter Right 11/27/2015    Procedure: INSERTION OF DIALYSIS CATHETER - Right Internal Jugular Placement;  Surgeon: Chuck Hint, MD;  Location: [facility] OR;  Service: Vascular;  Laterality: Right;  . Ep implantable device N/A 12/12/2015    Procedure: Pacemaker Implant;  Surgeon: Will Jorja Loa, MD;  Location: [facility] INVASIVE CV LAB;  Service: Cardiovascular;  Laterality: N/A;  . Peripheral vascular catheterization Left 01/27/2016    Procedure: Fistulagram;  Surgeon: Nada Libman, MD;  Location: [facility] INVASIVE CV LAB;  Service: Cardiovascular;  Laterality: Left;  upper arm  . Ligation arteriovenous gortex graft Left 01/27/2016    Procedure: LEFT LIGATION ARTERIOVENOUS GORTEX GRAFT;  Surgeon: Chuck Hint, MD;  Location: [facility] OR;  Service: Vascular;  Laterality: Left;     reports that he has never smoked. He has never used smokeless tobacco. He reports that he does not  drink alcohol or use illicit drugs.  Allergies  Allergen Reactions  . Aspirin Other (See Comments)    Bleeding   . Lactose Intolerance (Gi) Diarrhea    Family History  Problem Relation Age of Onset  . Sudden death Mother   . Sudden death Father      Prior to Admission medications   Medication Sig Start Date End  Date Taking? Authorizing Provider  amLODipine (NORVASC) 5 MG tablet Take 1 tablet (5 mg total) by mouth at bedtime. 02/26/16   [name] L [name], DO  calcitRIOL (ROCALTROL) 0.5 MCG capsule Take 2 capsules (1 mcg total) by mouth every Monday, Wednesday, and Friday with hemodialysis. 02/26/16   [name] L [name], DO  calcium acetate (PHOSLO) 667 MG capsule Take 3 capsules by mouth three times a day 02/26/16   [name] L [name], DO  divalproex (DEPAKOTE ER) 500 MG 24 hr tablet Take 1 tablet (500 mg total) by mouth every 12 (twelve) hours. 03/17/16   Shanker Levora Dredge, MD  fexofenadine (ALLEGRA) 180 MG tablet Take 1 tablet (180 mg total) by mouth daily as needed for allergies or rhinitis. 02/26/16   [name] L [name], DO  folic acid (FOLVITE) 1 MG tablet Take 1 tablet (1 mg total) by mouth daily. 02/26/16   [name] L [name], DO  gabapentin (NEURONTIN) 100 MG capsule Take 1 capsule (100 mg total) by mouth 3 (three) times daily. 02/26/16   [name] L [name], DO  glucose blood (ACCU-CHEK AVIVA PLUS) test strip 1 each by Other route 3 (three) times daily. Use as instructed 01/21/16   [name] L [name], DO  HUMALOG KWIKPEN 100 UNIT/ML KiwkPen Inject 5 Units into the skin See admin instructions. Per sliding scale 02/19/16   Historical Provider, MD  Insulin Pen Needle 31G X 8 MM MISC Use as directed with insulin pens 03/01/16   [name] L [name], DO  Lacosamide 150 MG TABS Take 1 tablet (150 mg total) by mouth 2 (two) times daily. 03/17/16   Shanker Levora Dredge, MD  multivitamin (RENA-VIT) TABS tablet Take 1 tablet by mouth at bedtime. 02/26/16   [name] L [name], DO  pantoprazole (PROTONIX) 40 MG tablet Take 1 tablet (40 mg total) by mouth 2 (two) times daily. 02/26/16   [name] L [name], DO  sennosides-docusate sodium (SENOKOT-S) 8.6-50 MG tablet Take 1 tablet by mouth every evening. 02/26/16   Kermit Balo, DO    Physical Exam: Filed Vitals:   03/18/16 0500 03/18/16 0520 03/18/16 0522 03/18/16 0759  BP: 191/92 169/69  129/41  Pulse: 80 79  70    Temp:   101.1 F (38.4 C) 98.5 F (36.9 C)  TempSrc:   Oral Oral  Resp: 20 17  16   Height:      Weight:      SpO2: 95% 95%  94%    Constitutional:  . Sleepy. Not in any distress. Eyes:  Mild pallor. No jaundice.  ENMT:  . external ears, nose appear normal Neck:  . Neck is supple. No JVD Respiratory:  . Added expiratory sounds, likely transmission from upper airways.   CVS: . S1S2 . No LE extremity edema   Abdomen:  . Abdomen is soft and non tender. Organs are difficult to assess. Neurologic:  Sleepy. Can't participate in full Neuro examination at the moment.    Wt Readings from Last 3 Encounters:  03/18/16 82.101 kg (181 lb)  03/17/16 82.3 kg (181 lb 7 oz)  03/11/16 86.637 kg (191 lb)  I have personally reviewed following labs and imaging studies  Labs on Admission:  CBC:  Recent Labs Lab 03/16/16 0214 03/17/16 0343 03/18/16 0239  WBC 3.1* 2.4* 3.5*  NEUTROABS 2.0  --  2.4  HGB 12.0* 11.7* 12.9*  HCT 36.4* 35.9* 41.1  MCV 98.4 98.9 100.0  PLT 54* 45* 33*   Basic Metabolic Panel:  Recent Labs Lab 03/16/16 0214 03/17/16 0343 03/18/16 0239  NA 136 139 137  K 4.0 4.4 5.0  CL 101 104 101  CO2 24 24 26   GLUCOSE 163* 187* 223*  BUN 50* 63* 39*  CREATININE 7.11* 8.26* 5.95*  CALCIUM 9.5 9.2 9.1   Liver Function Tests:  Recent Labs Lab 03/16/16 0214 03/18/16 0239  AST 30 50*  ALT 22 33  ALKPHOS 68 70  BILITOT 0.8 0.9  PROT 7.2 6.9  ALBUMIN 3.4* 3.2*   No results for input(s): LIPASE, AMYLASE in the last 168 hours.  Recent Labs Lab 03/16/16 0934  AMMONIA 36*   Coagulation Profile: No results for input(s): INR, PROTIME in the last 168 hours. Cardiac Enzymes:  Recent Labs Lab 03/16/16 0934  CKTOTAL 83   BNP (last 3 results) No results for input(s): PROBNP in the last 8760 hours. HbA1C: No results for input(s): HGBA1C in the last 72 hours. CBG:  Recent Labs Lab 03/16/16 1116 03/16/16 1640 03/16/16 1957 03/17/16 1148  03/18/16 0226  GLUCAP 138* 130* 115* 181* 203*   Lipid Profile: No results for input(s): CHOL, HDL, LDLCALC, TRIG, CHOLHDL, LDLDIRECT in the last 72 hours. Thyroid Function Tests: No results for input(s): TSH, T4TOTAL, FREET4, T3FREE, THYROIDAB in the last 72 hours. Anemia Panel: No results for input(s): VITAMINB12, FOLATE, FERRITIN, TIBC, IRON, RETICCTPCT in the last 72 hours. Urine analysis:    Component Value Date/Time   COLORURINE YELLOW 03/18/2016 0350   APPEARANCEUR CLEAR 03/18/2016 0350   LABSPEC 1.014 03/18/2016 0350   PHURINE 7.5 03/18/2016 0350   GLUCOSEU 500* 03/18/2016 0350   HGBUR TRACE* 03/18/2016 0350   BILIRUBINUR NEGATIVE 03/18/2016 0350   KETONESUR NEGATIVE 03/18/2016 0350   PROTEINUR 100* 03/18/2016 0350   UROBILINOGEN 0.2 06/20/2015 0845   NITRITE NEGATIVE 03/18/2016 0350   LEUKOCYTESUR NEGATIVE 03/18/2016 0350   Sepsis Labs: @LABRCNTIP (procalcitonin:4,lacticidven:4) ) Recent Results (from the past 240 hour(s))  Blood culture (routine x 2)     Status: None (Preliminary result)   Collection Time: 03/16/16  2:00 AM  Result Value Ref Range Status   Specimen Description BLOOD RIGHT HAND  Final   Special Requests IN PEDIATRIC BOTTLE  Final   Culture NO GROWTH 1 DAY  Final   Report Status PENDING  Incomplete  Blood culture (routine x 2)     Status: None (Preliminary result)   Collection Time: 03/16/16  2:14 AM  Result Value Ref Range Status   Specimen Description BLOOD LEFT HAND  Final   Special Requests BOTTLES DRAWN AEROBIC AND ANAEROBIC  Final   Culture  Setup Time   Final    GRAM POSITIVE COCCI IN CLUSTERS AEROBIC BOTTLE ONLY Organism ID to follow CRITICAL RESULT CALLED TO, READ BACK BY AND VERIFIED WITH: K. HAMMONS [facility] AT 03/18/16 BY D. VANHOOK    Culture GRAM POSITIVE COCCI  Final   Report Status PENDING  Incomplete  Blood Culture ID Panel (Reflexed)     Status: Abnormal   Collection Time: 03/16/16  2:14 AM  Result Value Ref Range  Status   Enterococcus species NOT DETECTED NOT DETECTED Final  Vancomycin resistance NOT DETECTED NOT DETECTED Final   Listeria monocytogenes NOT DETECTED NOT DETECTED Final   Staphylococcus species DETECTED (A) NOT DETECTED Final    Comment: CRITICAL RESULT CALLED TO, READ BACK BY AND VERIFIED WITH: K. HAMMONS RPH AT 0733 03/18/16 BY D. VANHOOK    Staphylococcus aureus NOT DETECTED NOT DETECTED Final   Methicillin resistance DETECTED (A) NOT DETECTED Final    Comment: CRITICAL RESULT CALLED TO, READ BACK BY AND VERIFIED WITH: K. HAMMONS RPH AT 0768 03/18/16 BY D. VANHOOK    Streptococcus species NOT DETECTED NOT DETECTED Final   Streptococcus agalactiae NOT DETECTED NOT DETECTED Final   Streptococcus pneumoniae NOT DETECTED NOT DETECTED Final   Streptococcus pyogenes NOT DETECTED NOT DETECTED Final   Acinetobacter baumannii NOT DETECTED NOT DETECTED Final   Enterobacteriaceae species NOT DETECTED NOT DETECTED Final   Enterobacter cloacae complex NOT DETECTED NOT DETECTED Final   Escherichia coli NOT DETECTED NOT DETECTED Final   Klebsiella oxytoca NOT DETECTED NOT DETECTED Final   Klebsiella pneumoniae NOT DETECTED NOT DETECTED Final   Proteus species NOT DETECTED NOT DETECTED Final   Serratia marcescens NOT DETECTED NOT DETECTED Final   Carbapenem resistance NOT DETECTED NOT DETECTED Final   Haemophilus influenzae NOT DETECTED NOT DETECTED Final   Neisseria meningitidis NOT DETECTED NOT DETECTED Final   Pseudomonas aeruginosa NOT DETECTED NOT DETECTED Final   Candida albicans NOT DETECTED NOT DETECTED Final   Candida glabrata NOT DETECTED NOT DETECTED Final   Candida krusei NOT DETECTED NOT DETECTED Final   Candida parapsilosis NOT DETECTED NOT DETECTED Final   Candida tropicalis NOT DETECTED NOT DETECTED Final      Radiological Exams on Admission: Dg Chest Port 1 View  03/18/2016  CLINICAL DATA:  Fever and altered mental status tonight. EXAM: PORTABLE CHEST 1 VIEW COMPARISON:   03/16/2016 FINDINGS: Cardiac pacemaker. Right central venous catheter with tip over the low SVC region. No pneumothorax. Shallow inspiration with atelectasis in the left lung base. No focal consolidation. No blunting of costophrenic angles. Normal heart size and pulmonary vascularity. Calcification of the aorta. Degenerative changes in the shoulders. IMPRESSION: Appliances appear in satisfactory location. Shallow inspiration with atelectasis in the left lung base. Electronically Signed   By: Burman Nieves M.D.   On: 03/18/2016 03:38    EKG: Independently reviewed.   Active Problems:   Encephalopathy   Pyrexia   Assessment/Plan 1. Encephalopathy, probably toxic.  2. History of seizure 3. ESRD on HD MWF 4. Hypertension 5. DM 6. Atelectasis 7. Rule out pneumonia (HCAP)   Admit patient  Low threshold to re-consult Neurology  Pan culture patient  IV antibiotics  Cautious hydration (at the most, total of 500 cc and reassess)  Continue seizure medication  Optimize HTN and DM  Speech evaluation  Repeat CXR in am after hydration  Nephrology team contacted as patient will need HD MWF  DVT prophylaxis: SCD (??Possible concerns for HIT in the past) Code Status: To be ascertained (I have seen documentation of DNR, but no collateral informant at the moment) Family Communication:  Disposition Plan: To be determined.   Consults called: Nephrology   Admission status: Inpatient     Time spent: 66 minutes  Berton Mount, MD  [facility] Pager #: [phone] 7PM-7AM contact night coverage as above   03/18/2016, 8:26 AM

## 2016-03-18 NOTE — ED Notes (Signed)
Pts wife states that the pt goes by the name "[name]" - pt responds best to this name

## 2016-03-19 ENCOUNTER — Observation Stay (HOSPITAL_COMMUNITY): Payer: Medicare Other

## 2016-03-19 DIAGNOSIS — Z66 Do not resuscitate: Secondary | ICD-10-CM | POA: Diagnosis not present

## 2016-03-19 DIAGNOSIS — J9811 Atelectasis: Secondary | ICD-10-CM | POA: Diagnosis not present

## 2016-03-19 DIAGNOSIS — R633 Feeding difficulties: Secondary | ICD-10-CM | POA: Diagnosis not present

## 2016-03-19 DIAGNOSIS — D638 Anemia in other chronic diseases classified elsewhere: Secondary | ICD-10-CM | POA: Diagnosis not present

## 2016-03-19 DIAGNOSIS — I272 Other secondary pulmonary hypertension: Secondary | ICD-10-CM | POA: Diagnosis not present

## 2016-03-19 DIAGNOSIS — E8889 Other specified metabolic disorders: Secondary | ICD-10-CM | POA: Diagnosis not present

## 2016-03-19 DIAGNOSIS — E1122 Type 2 diabetes mellitus with diabetic chronic kidney disease: Secondary | ICD-10-CM | POA: Diagnosis not present

## 2016-03-19 DIAGNOSIS — R4182 Altered mental status, unspecified: Secondary | ICD-10-CM | POA: Diagnosis not present

## 2016-03-19 DIAGNOSIS — N2581 Secondary hyperparathyroidism of renal origin: Secondary | ICD-10-CM | POA: Diagnosis not present

## 2016-03-19 DIAGNOSIS — D696 Thrombocytopenia, unspecified: Secondary | ICD-10-CM | POA: Diagnosis not present

## 2016-03-19 DIAGNOSIS — R509 Fever, unspecified: Secondary | ICD-10-CM | POA: Diagnosis not present

## 2016-03-19 DIAGNOSIS — I120 Hypertensive chronic kidney disease with stage 5 chronic kidney disease or end stage renal disease: Secondary | ICD-10-CM | POA: Diagnosis not present

## 2016-03-19 DIAGNOSIS — N186 End stage renal disease: Secondary | ICD-10-CM | POA: Diagnosis not present

## 2016-03-19 DIAGNOSIS — Z992 Dependence on renal dialysis: Secondary | ICD-10-CM | POA: Diagnosis not present

## 2016-03-19 DIAGNOSIS — Z794 Long term (current) use of insulin: Secondary | ICD-10-CM | POA: Diagnosis not present

## 2016-03-19 DIAGNOSIS — Z981 Arthrodesis status: Secondary | ICD-10-CM | POA: Diagnosis not present

## 2016-03-19 DIAGNOSIS — G9340 Encephalopathy, unspecified: Secondary | ICD-10-CM | POA: Diagnosis not present

## 2016-03-19 DIAGNOSIS — Z950 Presence of cardiac pacemaker: Secondary | ICD-10-CM | POA: Diagnosis not present

## 2016-03-19 LAB — BASIC METABOLIC PANEL
Anion gap: 11 (ref 5–15)
BUN: 49 mg/dL — ABNORMAL HIGH (ref 6–20)
CO2: 25 mmol/L (ref 22–32)
Calcium: 9.1 mg/dL (ref 8.9–10.3)
Chloride: 101 mmol/L (ref 101–111)
Creatinine, Ser: 7.29 mg/dL — ABNORMAL HIGH (ref 0.61–1.24)
GFR calc Af Amer: 7 mL/min — ABNORMAL LOW (ref >60)
GFR calc non Af Amer: 6 mL/min — ABNORMAL LOW (ref >60)
Glucose, Bld: 148 mg/dL — ABNORMAL HIGH (ref 65–99)
Potassium: 4.8 mmol/L (ref 3.5–5.1)
Sodium: 137 mmol/L (ref 135–145)

## 2016-03-19 LAB — CBC
HCT: 34.9 % — ABNORMAL LOW (ref 39.0–52.0)
Hemoglobin: 11.4 g/dL — ABNORMAL LOW (ref 13.0–17.0)
MCH: 32.2 pg (ref 26.0–34.0)
MCHC: 32.7 g/dL (ref 30.0–36.0)
MCV: 98.6 fL (ref 78.0–100.0)
Platelets: 51 10*3/uL — ABNORMAL LOW (ref 150–400)
RBC: 3.54 MIL/uL — ABNORMAL LOW (ref 4.22–5.81)
RDW: 16 % — ABNORMAL HIGH (ref 11.5–15.5)
WBC: 3.4 10*3/uL — ABNORMAL LOW (ref 4.0–10.5)

## 2016-03-19 LAB — RENAL FUNCTION PANEL
Albumin: 2.6 g/dL — ABNORMAL LOW (ref 3.5–5.0)
Anion gap: 12 (ref 5–15)
BUN: 50 mg/dL — ABNORMAL HIGH (ref 6–20)
CO2: 24 mmol/L (ref 22–32)
Calcium: 8.9 mg/dL (ref 8.9–10.3)
Chloride: 100 mmol/L — ABNORMAL LOW (ref 101–111)
Creatinine, Ser: 7.27 mg/dL — ABNORMAL HIGH (ref 0.61–1.24)
GFR calc Af Amer: 7 mL/min — ABNORMAL LOW (ref >60)
GFR calc non Af Amer: 6 mL/min — ABNORMAL LOW (ref >60)
Glucose, Bld: 148 mg/dL — ABNORMAL HIGH (ref 65–99)
Phosphorus: 4.5 mg/dL (ref 2.5–4.6)
Potassium: 4.7 mmol/L (ref 3.5–5.1)
Sodium: 136 mmol/L (ref 135–145)

## 2016-03-19 LAB — URINE CULTURE: Culture: NO GROWTH

## 2016-03-19 LAB — VALPROIC ACID LEVEL: Valproic Acid Lvl: 29 ug/mL — ABNORMAL LOW (ref 50.0–100.0)

## 2016-03-19 LAB — CULTURE, BLOOD (ROUTINE X 2)

## 2016-03-19 MED ORDER — CALCITRIOL 0.5 MCG PO CAPS
ORAL_CAPSULE | ORAL | Status: AC
  Filled 2016-03-19: qty 2

## 2016-03-19 MED ORDER — ALTEPLASE 2 MG IJ SOLR: 2.0000 mg | Freq: Once | INTRAMUSCULAR | Status: DC | PRN

## 2016-03-19 MED ORDER — LIDOCAINE-PRILOCAINE 2.5-2.5 % EX CREA: 1.0000 "application " | TOPICAL_CREAM | CUTANEOUS | Status: DC | PRN

## 2016-03-19 MED ORDER — PENTAFLUOROPROP-TETRAFLUOROETH EX AERO: 1.0000 "application " | INHALATION_SPRAY | CUTANEOUS | Status: DC | PRN

## 2016-03-19 MED ORDER — VANCOMYCIN HCL IN DEXTROSE 1-5 GM/200ML-% IV SOLN
INTRAVENOUS | Status: AC
  Filled 2016-03-19: qty 200

## 2016-03-19 MED ORDER — SODIUM CHLORIDE 0.9 % IV SOLN: 100.0000 mL | INTRAVENOUS | Status: DC | PRN

## 2016-03-19 MED ORDER — LIDOCAINE HCL (PF) 1 % IJ SOLN: 5.0000 mL | INTRAMUSCULAR | Status: DC | PRN

## 2016-03-19 NOTE — Procedures (Signed)
I have seen and examined this patient and agree with the plan of care   BP controlled on dialysis   Mentally slow --- appropriate  Hb 11.4  Ca 8.9   Alb 2     No edema    Raveen Wieseler W 03/19/2016, 8:48 AM

## 2016-03-19 NOTE — Progress Notes (Signed)
PROGRESS NOTE    [name]  NLZ:[id number] DOB: 07/21/1932 DOA: 03/18/2016 PCP: [name], DO    Brief Narrative:   82 year old male with history of subdural hematoma, seizure, ESRD on HD MWF, DM and HTN, was discharged from this hospital on 7/12  after in hospital stay and management for encephalopathy and possible seizure. Patient was seen by Neurologist, and anti-seizure medication adjusted. But patient developed high fever and became encephalopathic and was brought to ED for further evaluation.  Blood cultures from 7/11 revealed one set growing MRSA, currently on appropriate antibiotics.   Assessment & Plan:   Active Problems:   Encephalopathy   Pyrexia  Fevers of unclear etiology.  CXR repeated today and the left lung base atelectasis improved, no pneumonia.  UA is negative for infection.  Blood cultures from 7/11  Growing ?MRSA, . On vancomycin.  Repeat blood cultures this admission pending.   Acute encephalopathy: Improving. Ordered a repeat CT head without contrast as he had thrombocytopenia, which was negative for acute bleed. Marland Kitchen   ESRD on HD. MWF Further management as per renal.  HD session today.   Diabetes Mellitus: CBG (last 3)   Recent Labs  03/16/16 1957 03/17/16 1148 03/18/16 0226  GLUCAP 115* 181* 203*    Resume SSI while inpatient.   Hypertension.  Well controlled. Today. Resume home meds.   Seizure disorder: No seizure activity while inpatient, ordered phenytoin level.  Resume vimpat.   Anemia Probably anemia of chronic disease.  Monitor.   Thrombocytopenia Chronic , as low as 85,000 from 2012.  Never had any hematological work up so far.  Today's numbers are better than on admission, still less than 1,00,000.      DVT prophylaxis: SCD's  Code Status: FULL CODE Family Communication: none at bedside.  Disposition Plan: pending.    Consultants:   Nephrology.   Procedures: HD   Antimicrobials: vancomycin 7/13, zosyn  7/13   Subjective: No new complaints.   Objective: Filed Vitals:   03/19/16 1030 03/19/16 1100 03/19/16 1107 03/19/16 1109  BP: 153/100 148/75 138/75 107/75  Pulse: 80 77 76 88  Temp:   98.3 F (36.8 C)   TempSrc:      Resp: 16 20 15 19   Height:      Weight:    90 kg (198 lb 6.6 oz)  SpO2:  100% 98%     Intake/Output Summary (Last 24 hours) at 03/19/16 1306 Last data filed at 03/19/16 1109  Gross per 24 hour  Intake    220 ml  Output   1320 ml  Net  -1100 ml   Filed Weights   03/19/16 0435 03/19/16 0703 03/19/16 1109  Weight: 95.437 kg (210 lb 6.4 oz) 91 kg (200 lb 9.9 oz) 90 kg (198 lb 6.6 oz)    Examination:  General exam: Appears calm and comfortable  Respiratory system: Clear to auscultation. Respiratory effort normal. Cardiovascular system: S1 & S2 heard, RRR. No JVD, murmurs, rubs, gallops or clicks. No pedal edema. Gastrointestinal system: Abdomen is nondistended, soft and nontender. No organomegaly or masses felt. Normal bowel sounds heard. Central nervous system: Alert and oriented. No focal neurological deficits. Extremities: Symmetric 5 x 5 power. Skin: No rashes, lesions or ulcers Psychiatry: Judgement and insight appear normal. Mood & affect appropriate.     Data Reviewed: I have personally reviewed following labs and imaging studies  CBC:  Recent Labs Lab 03/16/16 0214 03/17/16 0343 03/18/16 0239 03/19/16 0504  WBC 3.1* 2.4*  3.5* 3.4*  NEUTROABS 2.0  --  2.4  --   HGB 12.0* 11.7* 12.9* 11.4*  HCT 36.4* 35.9* 41.1 34.9*  MCV 98.4 98.9 100.0 98.6  PLT 54* 45* 33* 51*   Basic Metabolic Panel:  Recent Labs Lab 03/16/16 0214 03/17/16 0343 03/18/16 0239 03/18/16 0841 03/19/16 0504 03/19/16 0755  NA 136 139 137  --  137 136  K 4.0 4.4 5.0  --  4.8 4.7  CL 101 104 101  --  101 100*  CO2 24 24 26   --  25 24  GLUCOSE 163* 187* 223*  --  148* 148*  BUN 50* 63* 39*  --  49* 50*  CREATININE 7.11* 8.26* 5.95*  --  7.29* 7.27*  CALCIUM 9.5  9.2 9.1  --  9.1 8.9  MG  --   --   --  2.0  --   --   PHOS  --   --   --  3.3  --  4.5   GFR: Estimated Creatinine Clearance: 8.6 mL/min (by C-G formula based on Cr of 7.27). Liver Function Tests:  Recent Labs Lab 03/16/16 0214 03/18/16 0239 03/19/16 0755  AST 30 50*  --   ALT 22 33  --   ALKPHOS 68 70  --   BILITOT 0.8 0.9  --   PROT 7.2 6.9  --   ALBUMIN 3.4* 3.2* 2.6*   No results for input(s): LIPASE, AMYLASE in the last 168 hours.  Recent Labs Lab 03/16/16 0934  AMMONIA 36*   Coagulation Profile: No results for input(s): INR, PROTIME in the last 168 hours. Cardiac Enzymes:  Recent Labs Lab 03/16/16 0934  CKTOTAL 83   BNP (last 3 results) No results for input(s): PROBNP in the last 8760 hours. HbA1C: No results for input(s): HGBA1C in the last 72 hours. CBG:  Recent Labs Lab 03/16/16 1116 03/16/16 1640 03/16/16 1957 03/17/16 1148 03/18/16 0226  GLUCAP 138* 130* 115* 181* 203*   Lipid Profile: No results for input(s): CHOL, HDL, LDLCALC, TRIG, CHOLHDL, LDLDIRECT in the last 72 hours. Thyroid Function Tests: No results for input(s): TSH, T4TOTAL, FREET4, T3FREE, THYROIDAB in the last 72 hours. Anemia Panel: No results for input(s): VITAMINB12, FOLATE, FERRITIN, TIBC, IRON, RETICCTPCT in the last 72 hours. Sepsis Labs:  Recent Labs Lab 03/16/16 0220 03/16/16 0512 03/18/16 0244 03/18/16 0525  LATICACIDVEN 1.20 0.54 1.80 0.61    Recent Results (from the past 240 hour(s))  Blood culture (routine x 2)     Status: None (Preliminary result)   Collection Time: 03/16/16  2:00 AM  Result Value Ref Range Status   Specimen Description BLOOD RIGHT HAND  Final   Special Requests IN PEDIATRIC BOTTLE 05/17/16  Final   Culture NO GROWTH 2 DAYS  Final   Report Status PENDING  Incomplete  Blood culture (routine x 2)     Status: Abnormal   Collection Time: 03/16/16  2:14 AM  Result Value Ref Range Status   Specimen Description BLOOD LEFT HAND  Final   Special  Requests BOTTLES DRAWN AEROBIC AND ANAEROBIC 05/17/16  Final   Culture  Setup Time   Final    GRAM POSITIVE COCCI IN CLUSTERS AEROBIC BOTTLE ONLY CRITICAL RESULT CALLED TO, READ BACK BY AND VERIFIED WITH: K. HAMMONS RPH AT 0733 03/18/16 BY D. VANHOOK    Culture (A)  Final    STAPHYLOCOCCUS SPECIES (COAGULASE NEGATIVE) THE SIGNIFICANCE OF ISOLATING THIS ORGANISM FROM A SINGLE SET OF BLOOD CULTURES WHEN MULTIPLE  SETS ARE DRAWN IS UNCERTAIN. PLEASE NOTIFY THE MICROBIOLOGY DEPARTMENT WITHIN ONE WEEK IF SPECIATION AND SENSITIVITIES ARE REQUIRED.    Report Status 03/19/2016 FINAL  Final  Blood Culture ID Panel (Reflexed)     Status: Abnormal   Collection Time: 03/16/16  2:14 AM  Result Value Ref Range Status   Enterococcus species NOT DETECTED NOT DETECTED Final   Vancomycin resistance NOT DETECTED NOT DETECTED Final   Listeria monocytogenes NOT DETECTED NOT DETECTED Final   Staphylococcus species DETECTED (A) NOT DETECTED Final    Comment: CRITICAL RESULT CALLED TO, READ BACK BY AND VERIFIED WITH: K. HAMMONS RPH AT 0733 03/18/16 BY D. VANHOOK    Staphylococcus aureus NOT DETECTED NOT DETECTED Final   Methicillin resistance DETECTED (A) NOT DETECTED Final    Comment: CRITICAL RESULT CALLED TO, READ BACK BY AND VERIFIED WITH: K. HAMMONS RPH AT 2585 03/18/16 BY D. VANHOOK    Streptococcus species NOT DETECTED NOT DETECTED Final   Streptococcus agalactiae NOT DETECTED NOT DETECTED Final   Streptococcus pneumoniae NOT DETECTED NOT DETECTED Final   Streptococcus pyogenes NOT DETECTED NOT DETECTED Final   Acinetobacter baumannii NOT DETECTED NOT DETECTED Final   Enterobacteriaceae species NOT DETECTED NOT DETECTED Final   Enterobacter cloacae complex NOT DETECTED NOT DETECTED Final   Escherichia coli NOT DETECTED NOT DETECTED Final   Klebsiella oxytoca NOT DETECTED NOT DETECTED Final   Klebsiella pneumoniae NOT DETECTED NOT DETECTED Final   Proteus species NOT DETECTED NOT DETECTED Final   Serratia  marcescens NOT DETECTED NOT DETECTED Final   Carbapenem resistance NOT DETECTED NOT DETECTED Final   Haemophilus influenzae NOT DETECTED NOT DETECTED Final   Neisseria meningitidis NOT DETECTED NOT DETECTED Final   Pseudomonas aeruginosa NOT DETECTED NOT DETECTED Final   Candida albicans NOT DETECTED NOT DETECTED Final   Candida glabrata NOT DETECTED NOT DETECTED Final   Candida krusei NOT DETECTED NOT DETECTED Final   Candida parapsilosis NOT DETECTED NOT DETECTED Final   Candida tropicalis NOT DETECTED NOT DETECTED Final  Urine culture     Status: None   Collection Time: 03/18/16  3:50 AM  Result Value Ref Range Status   Specimen Description URINE, CATHETERIZED  Final   Special Requests NONE  Final   Culture NO GROWTH  Final   Report Status 03/19/2016 FINAL  Final         Radiology Studies: Dg Chest Port 1 View  03/19/2016  CLINICAL DATA:  Atelectasis,hx htn,dm,esrd EXAM: PORTABLE CHEST 1 VIEW COMPARISON:  03/18/2016 FINDINGS: Mild improvement in the left basilar atelectasis. Lungs otherwise clear. No pneumothorax. Heart and mediastinum are unremarkable. Right internal jugular dual-lumen central venous catheter and right-sided sequential pacemaker are stable and well positioned. IMPRESSION: 1. No acute findings.  Improved left lung base atelectasis. Electronically Signed   By: Amie Portland M.D.   On: 03/19/2016 08:02   Dg Chest Port 1 View  03/18/2016  CLINICAL DATA:  Fever and altered mental status tonight. EXAM: PORTABLE CHEST 1 VIEW COMPARISON:  03/16/2016 FINDINGS: Cardiac pacemaker. Right central venous catheter with tip over the low SVC region. No pneumothorax. Shallow inspiration with atelectasis in the left lung base. No focal consolidation. No blunting of costophrenic angles. Normal heart size and pulmonary vascularity. Calcification of the aorta. Degenerative changes in the shoulders. IMPRESSION: Appliances appear in satisfactory location. Shallow inspiration with atelectasis  in the left lung base. Electronically Signed   By: Burman Nieves M.D.   On: 03/18/2016 03:38  Scheduled Meds: . amLODipine  5 mg Oral QHS  . calcitRIOL      . calcitRIOL  1 mcg Oral Q M,W,F-HD  . calcium acetate  2,001 mg Oral TID WC  . divalproex  500 mg Oral Daily  . feeding supplement (NEPRO CARB STEADY)  237 mL Oral BID BM  . folic acid  1 mg Oral Daily  . gabapentin  100 mg Oral TID  . lacosamide  150 mg Oral BID  . loratadine  10 mg Oral Daily  . multivitamin  1 tablet Oral QHS  . piperacillin-tazobactam (ZOSYN)  IV  2.25 g Intravenous Q8H  . senna-docusate  1 tablet Oral QPM  . vancomycin  1,000 mg Intravenous Q M,W,F-HD   Continuous Infusions:    LOS: 1 day    Time spent: 30  Minutes.    Kathlen Mody, MD [facility] Pager [phone]   If 7PM-7AM, please contact night-coverage [email or web address] Password [email or web address] 03/19/2016, 1:06 PM

## 2016-03-19 NOTE — Care Management CC44 (Signed)
Condition Code 44 Documentation Completed  Patient Details  Name: [name] MRN: [id number] Date of Birth: March 05, 1932   Condition Code 44 given:   03/19/2016 Patient signature on Condition Code 44 notice:   No Documentation of 2 MD's agreement:   Dr  Elvera Lennox Aronson/ Dr Leonia Corona Code 44 added to claim:       Starlyn Skeans, RN 03/19/2016, 11:52 AM

## 2016-03-19 NOTE — Care Management Obs Status (Addendum)
MEDICARE OBSERVATION STATUS NOTIFICATION   Patient Details  Name: Kregg Cihlar MRN: [id number] Date of Birth: 29-Apr-1932   Medicare Observation Status Notification Given:  Yes  Code 44 given.   Leopoldo Mazzie, Annamarie Major, RN 03/19/2016, 11:53 AM

## 2016-03-20 DIAGNOSIS — E1122 Type 2 diabetes mellitus with diabetic chronic kidney disease: Secondary | ICD-10-CM | POA: Diagnosis not present

## 2016-03-20 DIAGNOSIS — G40909 Epilepsy, unspecified, not intractable, without status epilepticus: Secondary | ICD-10-CM

## 2016-03-20 DIAGNOSIS — E1121 Type 2 diabetes mellitus with diabetic nephropathy: Secondary | ICD-10-CM | POA: Diagnosis not present

## 2016-03-20 DIAGNOSIS — N2581 Secondary hyperparathyroidism of renal origin: Secondary | ICD-10-CM | POA: Diagnosis not present

## 2016-03-20 DIAGNOSIS — I272 Other secondary pulmonary hypertension: Secondary | ICD-10-CM | POA: Diagnosis not present

## 2016-03-20 DIAGNOSIS — E8889 Other specified metabolic disorders: Secondary | ICD-10-CM | POA: Diagnosis not present

## 2016-03-20 DIAGNOSIS — I120 Hypertensive chronic kidney disease with stage 5 chronic kidney disease or end stage renal disease: Secondary | ICD-10-CM | POA: Diagnosis not present

## 2016-03-20 DIAGNOSIS — R633 Feeding difficulties: Secondary | ICD-10-CM | POA: Diagnosis not present

## 2016-03-20 DIAGNOSIS — Z794 Long term (current) use of insulin: Secondary | ICD-10-CM | POA: Diagnosis not present

## 2016-03-20 DIAGNOSIS — J9811 Atelectasis: Secondary | ICD-10-CM | POA: Diagnosis not present

## 2016-03-20 DIAGNOSIS — R509 Fever, unspecified: Secondary | ICD-10-CM | POA: Diagnosis not present

## 2016-03-20 DIAGNOSIS — R4182 Altered mental status, unspecified: Secondary | ICD-10-CM

## 2016-03-20 DIAGNOSIS — G9340 Encephalopathy, unspecified: Secondary | ICD-10-CM | POA: Diagnosis not present

## 2016-03-20 DIAGNOSIS — Z950 Presence of cardiac pacemaker: Secondary | ICD-10-CM | POA: Diagnosis not present

## 2016-03-20 DIAGNOSIS — N186 End stage renal disease: Secondary | ICD-10-CM | POA: Diagnosis not present

## 2016-03-20 DIAGNOSIS — Z992 Dependence on renal dialysis: Secondary | ICD-10-CM | POA: Diagnosis not present

## 2016-03-20 DIAGNOSIS — I5032 Chronic diastolic (congestive) heart failure: Secondary | ICD-10-CM | POA: Diagnosis not present

## 2016-03-20 DIAGNOSIS — D696 Thrombocytopenia, unspecified: Secondary | ICD-10-CM | POA: Diagnosis not present

## 2016-03-20 DIAGNOSIS — D638 Anemia in other chronic diseases classified elsewhere: Secondary | ICD-10-CM | POA: Diagnosis not present

## 2016-03-20 DIAGNOSIS — Z66 Do not resuscitate: Secondary | ICD-10-CM | POA: Diagnosis not present

## 2016-03-20 DIAGNOSIS — Z981 Arthrodesis status: Secondary | ICD-10-CM | POA: Diagnosis not present

## 2016-03-20 LAB — CBC
HCT: 37.6 % — ABNORMAL LOW (ref 39.0–52.0)
Hemoglobin: 12 g/dL — ABNORMAL LOW (ref 13.0–17.0)
MCH: 31.7 pg (ref 26.0–34.0)
MCHC: 31.9 g/dL (ref 30.0–36.0)
MCV: 99.5 fL (ref 78.0–100.0)
PLATELETS: 48 10*3/uL — AB (ref 150–400)
RBC: 3.78 MIL/uL — AB (ref 4.22–5.81)
RDW: 15.9 % — ABNORMAL HIGH (ref 11.5–15.5)
WBC: 3.8 10*3/uL — AB (ref 4.0–10.5)

## 2016-03-20 LAB — GLUCOSE, CAPILLARY: Glucose-Capillary: 166 mg/dL — ABNORMAL HIGH (ref 65–99)

## 2016-03-20 MED ORDER — INSULIN ASPART 100 UNIT/ML ~~LOC~~ SOLN
0.0000 [IU] | Freq: Three times a day (TID) | SUBCUTANEOUS | Status: DC
  Administered 2016-03-20 – 2016-03-21 (×2): 2 [IU] via SUBCUTANEOUS
  Administered 2016-03-21: 1 [IU] via SUBCUTANEOUS

## 2016-03-20 MED ORDER — HYDRALAZINE HCL 10 MG PO TABS: 10.0000 mg | ORAL_TABLET | Freq: Four times a day (QID) | ORAL | Status: DC | PRN

## 2016-03-20 MED ORDER — INSULIN ASPART 100 UNIT/ML ~~LOC~~ SOLN: 0.0000 [IU] | Freq: Every day | SUBCUTANEOUS | Status: DC

## 2016-03-20 NOTE — Progress Notes (Signed)
[facility] Progress Note  Assessment/Plan: 1. Acute febrile illness with exncephalopathy- -Tmax 98.9  high risk for infection given TDC and recent hospitalization, also has PM - has been pancultured and placed on empiric Vanc and Zosyn; AMS waxes and wans.  Alert and oriented Thursday.  Today, rouses acted alert but not answering my questions. Nursing assistant fed him a bite of eggs and he chewed and chewed but would not swallow when insturcted to do so.    BC from 7/11 prior OBS admission now 1/2 + BC MR staph species; 7/13  BC x 2 no growth on appropriate coverage Head CT 7/14 no acute findings 2. ESRD - MWF - HD - had HD Friday without incident.- next HD Monday 3. Hypertension/volume - BP variable - no overt volume on exame-no volume on CXR 7/13 and 7/14 Net UF 1 L ; Post HD wt 90 - at [facility]W 4. Anemia - 12.- Hold redosing ESA And Fe for now 5. Metabolic bone disease - Controlled - Continue calcitriol /binders 6. Nutrition -change to renal carb mod diet - due to high K in heart healthy diet /vits/nepro- little intake recorded for Friday 7. Seizure disorder 8. DM 9. Dementia 10. Thrombocytopenia - chronic but lower than usual, likely due to acute illness - not on heparin except in catheter - repeat plts with HD Friday 11. Feeding difficulties - reported to RN and have discussed with primary  [name], PA-C  [facility] [phone] 03/20/2016,8:49 AM  LOS: 2 days   Subjective:   Not answering questions, but laughed when I asked him if he was a the [employer]. Nursing assistant said he had 2 large nondiarrheal stools today that he was unaware of and essentially didn't wake up while being cleaned up. Objective Filed Vitals:   03/19/16 1744 03/19/16 2111 03/20/16 0450 03/20/16 0827  BP: 161/78 163/74 130/45 132/53  Pulse: 75 86 84 83  Temp: 98.6 F (37 C) 98.9 F (37.2 C) 98.6 F (37 C) 99 F (37.2 C)  TempSrc: Oral Oral Oral Axillary   Resp: 18 18 16 18   Height:      Weight:  89.8 kg (197 lb 15.6 oz)    SpO2: 98% 97% 96% 92%   Physical Exam General: awake, but not answering questions  Heart:RRR Lungs: Abdomen: Extremities: Dialysis Access: right IJ  Dialysis Orders: [facility] MWF 3.75 180 400/800 EDW 90 2 K 2.25 Ca profile 4 right IJ active NO heparin except in catheter venofer 50/week 7/5, Mircera 100 q 2 weeks - last 7/5 Calcitriol 0.25 Recent labs: hgb 11.2 41% sat iPTH 251 Corr Ca 10.2 P OK  Additional Objective Labs: Basic Metabolic Panel:  Recent Labs Lab 03/18/16 0239 03/18/16 0841 03/19/16 0504 03/19/16 0755  NA 137  --  137 136  K 5.0  --  4.8 4.7  CL 101  --  101 100*  CO2 26  --  25 24  GLUCOSE 223*  --  148* 148*  BUN 39*  --  49* 50*  CREATININE 5.95*  --  7.29* 7.27*  CALCIUM 9.1  --  9.1 8.9  PHOS  --  3.3  --  4.5   Liver Function Tests:  Recent Labs Lab 03/16/16 0214 03/18/16 0239 03/19/16 0755  AST 30 50*  --   ALT 22 33  --   ALKPHOS 68 70  --   BILITOT 0.8 0.9  --   PROT 7.2 6.9  --   ALBUMIN 3.4*  3.2* 2.6*   CBC:  Recent Labs Lab 03/16/16 0214 03/17/16 0343 03/18/16 0239 03/19/16 0504 03/20/16 0557  WBC 3.1* 2.4* 3.5* 3.4* 3.8*  NEUTROABS 2.0  --  2.4  --   --   HGB 12.0* 11.7* 12.9* 11.4* 12.0*  HCT 36.4* 35.9* 41.1 34.9* 37.6*  MCV 98.4 98.9 100.0 98.6 99.5  PLT 54* 45* 33* 51* 48*   Blood Culture    Component Value Date/Time   SDES URINE, CATHETERIZED 03/18/2016 0350   SPECREQUEST NONE 03/18/2016 0350   CULT NO GROWTH 03/18/2016 0350   REPTSTATUS 03/19/2016 FINAL 03/18/2016 0350    Cardiac Enzymes:  Recent Labs Lab 03/16/16 0934  CKTOTAL 83   CBG:  Recent Labs Lab 03/16/16 1116 03/16/16 1640 03/16/16 1957 03/17/16 1148 03/18/16 0226  GLUCAP 138* 130* 115* 181* 203*    Lab Results  Component Value Date   INR 1.21 03/04/2016   INR 1.16 09/15/2015   INR 1.08 07/26/2015   Studies/Results: Ct Head Wo Contrast  03/19/2016   CLINICAL DATA:  Altered mental status EXAM: CT HEAD WITHOUT CONTRAST TECHNIQUE: Contiguous axial images were obtained from the base of the skull through the vertex without intravenous contrast. COMPARISON:  03/16/2016 FINDINGS: The examination is somewhat limited by patient motion artifact. The bony calvarium is intact. Atrophic changes are noted. Chronic white matter ischemic change is again seen. Bilateral basal ganglia calcifications are noted. No findings to suggest acute hemorrhage, acute infarction or space-occupying mass lesion are noted. IMPRESSION: Chronic atrophic and ischemic changes without acute intracranial abnormality. Electronically Signed   By: Alcide Clever M.D.   On: 03/19/2016 14:15   Dg Chest Port 1 View  03/19/2016  CLINICAL DATA:  Atelectasis,hx htn,dm,esrd EXAM: PORTABLE CHEST 1 VIEW COMPARISON:  03/18/2016 FINDINGS: Mild improvement in the left basilar atelectasis. Lungs otherwise clear. No pneumothorax. Heart and mediastinum are unremarkable. Right internal jugular dual-lumen central venous catheter and right-sided sequential pacemaker are stable and well positioned. IMPRESSION: 1. No acute findings.  Improved left lung base atelectasis. Electronically Signed   By: Amie Portland M.D.   On: 03/19/2016 08:02   Medications:   . amLODipine  5 mg Oral QHS  . calcitRIOL  1 mcg Oral Q M,W,F-HD  . calcium acetate  2,001 mg Oral TID WC  . divalproex  500 mg Oral Daily  . feeding supplement (NEPRO CARB STEADY)  237 mL Oral BID BM  . folic acid  1 mg Oral Daily  . gabapentin  100 mg Oral TID  . lacosamide  150 mg Oral BID  . loratadine  10 mg Oral Daily  . multivitamin  1 tablet Oral QHS  . piperacillin-tazobactam (ZOSYN)  IV  2.25 g Intravenous Q8H  . senna-docusate  1 tablet Oral QPM  . vancomycin  1,000 mg Intravenous Q M,W,F-HD

## 2016-03-20 NOTE — Progress Notes (Signed)
PROGRESS NOTE    [name]  ZOX:[id number] DOB: 1932/01/08 DOA: 03/18/2016 PCP: [name], DO    Brief Narrative:   82 year old male with history of subdural hematoma, seizure, ESRD on HD MWF, DM and HTN, was discharged from this hospital on 7/12 after in hospital stay and management for encephalopathy and possible seizure. Patient was seen by Neurologist, and anti-seizure medication adjusted. But patient developed high fever and became encephalopathic and was brought to ED for further evaluation.   Blood cultures from 7/11 revealed one set growing MRSA, currently on appropriate antibiotics.    Assessment & Plan:   Encephalopathy - Unclear etiology.  Per discussion with nephrology team he had some appropriate responses to her.  He also echoed back a question I asked him and opened eyes to voice.  Possibly delirium due to toxic encephalopathy.  I do not think this is seizure related, though he could be post-ictal and having unwitnessed seizures - Monitor closely - Treat infection as below - CT head on 7/11 and 7/14 showed no acute issues.  I do not think his status has changed significantly to warrant another CT head - AST was mildly elevated, repeat CMET in the AM - Swallowing issues this morning noted - Consider EEG - Discuss with family when present what his normal baseline is - If no improvement with further Abx, consider neurology consult    Pyrexia - Tmax in last 24 hours was 46F - BC on 7/11 showed coag neg staph which was noted to be methicillin resistant - He is currently on vancomycin and zosyn for possible hospital acquired infection - CXR showed only atelectasis - UA without signs of infection - Work up any acute findings  ESRD on HD - Renal following, HD as scheduled by that service  DM2 on insulin - He is on a SS of humolog at home - Will place on SSI here in the hospital  HTN - BP on the rise, possible hypertensive encephalopathy? - Home amlodipine  started - Add PRN hydralazine, consider scheduled if still elevated  Anemia of chronic disease - HGB at baseline, monitor  Seizure disorder - Lacosamide and Depakote changed at last visit, possibly oversedated from these? - Depakote level low on this admission - Consider neurology consult in the AM   DVT prophylaxis: SCDs (h/o SDH) Code Status: Full  Disposition Plan: Pending improvement, 2-3 days   Consultants:   Nephrology  Procedures:   None  Antimicrobials:   Vancomycin 7/13 --> current  Zosyn 7/13 --> current   Subjective: Not very interactive.  Patient responded with grunts, answered some questions appropriately.  Apparently denied pain, though hard to differentiate.   Objective: Filed Vitals:   03/19/16 1744 03/19/16 2111 03/20/16 0450 03/20/16 0827  BP: 161/78 163/74 130/45 132/53  Pulse: 75 86 84 83  Temp: 98.6 F (37 C) 98.9 F (37.2 C) 98.6 F (37 C) 99 F (37.2 C)  TempSrc: Oral Oral Oral Axillary  Resp: 18 18 16 18   Height:      Weight:  197 lb 15.6 oz (89.8 kg)    SpO2: 98% 97% 96% 92%    Intake/Output Summary (Last 24 hours) at 03/20/16 1054 Last data filed at 03/20/16 1016  Gross per 24 hour  Intake    120 ml  Output   1410 ml  Net  -1290 ml   Filed Weights   03/19/16 0703 03/19/16 1109 03/19/16 2111  Weight: 200 lb 9.9 oz (91 kg) 198 lb  6.6 oz (90 kg) 197 lb 15.6 oz (89.8 kg)    Examination:  General exam: Appears calm and comfortable, only verbalization is "why am I in the hospital" which was a repeat of what I asked him.  HENT: Neck supple, NCAT Respiratory system: Respiratory effort normal.  No audible wheezing Cardiovascular system: S1 & S2 heard, RR, NR. No pedal edema. Gastrointestinal system: Abdomen is nondistended, soft and nontender. . Normal bowel sounds heard. Central nervous system: Alert and oriented. No focal neurological deficits. GU: Foley in place, draining clear urine Skin: No rashes, lesions or  ulcers Psychiatry: Unable to assess due to patient interaction, unclear baseline    Data Reviewed:   CBC:  Recent Labs Lab 03/16/16 0214 03/17/16 0343 03/18/16 0239 03/19/16 0504 03/20/16 0557  WBC 3.1* 2.4* 3.5* 3.4* 3.8*  NEUTROABS 2.0  --  2.4  --   --   HGB 12.0* 11.7* 12.9* 11.4* 12.0*  HCT 36.4* 35.9* 41.1 34.9* 37.6*  MCV 98.4 98.9 100.0 98.6 99.5  PLT 54* 45* 33* 51* 48*   Basic Metabolic Panel:  Recent Labs Lab 03/16/16 0214 03/17/16 0343 03/18/16 0239 03/18/16 0841 03/19/16 0504 03/19/16 0755  NA 136 139 137  --  137 136  K 4.0 4.4 5.0  --  4.8 4.7  CL 101 104 101  --  101 100*  CO2 24 24 26   --  25 24  GLUCOSE 163* 187* 223*  --  148* 148*  BUN 50* 63* 39*  --  49* 50*  CREATININE 7.11* 8.26* 5.95*  --  7.29* 7.27*  CALCIUM 9.5 9.2 9.1  --  9.1 8.9  MG  --   --   --  2.0  --   --   PHOS  --   --   --  3.3  --  4.5   GFR: Estimated Creatinine Clearance: 8.6 mL/min (by C-G formula based on Cr of 7.27). Liver Function Tests:  Recent Labs Lab 03/16/16 0214 03/18/16 0239 03/19/16 0755  AST 30 50*  --   ALT 22 33  --   ALKPHOS 68 70  --   BILITOT 0.8 0.9  --   PROT 7.2 6.9  --   ALBUMIN 3.4* 3.2* 2.6*   No results for input(s): LIPASE, AMYLASE in the last 168 hours.  Recent Labs Lab 03/16/16 0934  AMMONIA 36*   Coagulation Profile: No results for input(s): INR, PROTIME in the last 168 hours. Cardiac Enzymes:  Recent Labs Lab 03/16/16 0934  CKTOTAL 83   BNP (last 3 results) No results for input(s): PROBNP in the last 8760 hours. HbA1C: No results for input(s): HGBA1C in the last 72 hours. CBG:  Recent Labs Lab 03/16/16 1116 03/16/16 1640 03/16/16 1957 03/17/16 1148 03/18/16 0226  GLUCAP 138* 130* 115* 181* 203*   Lipid Profile: No results for input(s): CHOL, HDL, LDLCALC, TRIG, CHOLHDL, LDLDIRECT in the last 72 hours. Thyroid Function Tests: No results for input(s): TSH, T4TOTAL, FREET4, T3FREE, THYROIDAB in the last 72  hours. Anemia Panel: No results for input(s): VITAMINB12, FOLATE, FERRITIN, TIBC, IRON, RETICCTPCT in the last 72 hours. Sepsis Labs:  Recent Labs Lab 03/16/16 0220 03/16/16 0512 03/18/16 0244 03/18/16 0525  LATICACIDVEN 1.20 0.54 1.80 0.61    Recent Results (from the past 240 hour(s))  Blood culture (routine x 2)     Status: None (Preliminary result)   Collection Time: 03/16/16  2:00 AM  Result Value Ref Range Status   Specimen Description BLOOD RIGHT  HAND  Final   Special Requests IN PEDIATRIC BOTTLE  Final   Culture NO GROWTH 3 DAYS  Final   Report Status PENDING  Incomplete  Blood culture (routine x 2)     Status: Abnormal   Collection Time: 03/16/16  2:14 AM  Result Value Ref Range Status   Specimen Description BLOOD LEFT HAND  Final   Special Requests BOTTLES DRAWN AEROBIC AND ANAEROBIC  Final   Culture  Setup Time   Final    GRAM POSITIVE COCCI IN CLUSTERS AEROBIC BOTTLE ONLY CRITICAL RESULT CALLED TO, READ BACK BY AND VERIFIED WITH: K. HAMMONS RPH AT 0733 03/18/16 BY D. VANHOOK    Culture (A)  Final    STAPHYLOCOCCUS SPECIES (COAGULASE NEGATIVE) THE SIGNIFICANCE OF ISOLATING THIS ORGANISM FROM A SINGLE SET OF BLOOD CULTURES WHEN MULTIPLE SETS ARE DRAWN IS UNCERTAIN. PLEASE NOTIFY THE MICROBIOLOGY DEPARTMENT WITHIN ONE WEEK IF SPECIATION AND SENSITIVITIES ARE REQUIRED.    Report Status 03/19/2016 FINAL  Final  Blood Culture ID Panel (Reflexed)     Status: Abnormal   Collection Time: 03/16/16  2:14 AM  Result Value Ref Range Status   Enterococcus species NOT DETECTED NOT DETECTED Final   Vancomycin resistance NOT DETECTED NOT DETECTED Final   Listeria monocytogenes NOT DETECTED NOT DETECTED Final   Staphylococcus species DETECTED (A) NOT DETECTED Final    Comment: CRITICAL RESULT CALLED TO, READ BACK BY AND VERIFIED WITH: K. HAMMONS RPH AT 0733 03/18/16 BY D. VANHOOK    Staphylococcus aureus NOT DETECTED NOT DETECTED Final   Methicillin resistance DETECTED  (A) NOT DETECTED Final    Comment: CRITICAL RESULT CALLED TO, READ BACK BY AND VERIFIED WITH: K. HAMMONS RPH AT 1610 03/18/16 BY D. VANHOOK    Streptococcus species NOT DETECTED NOT DETECTED Final   Streptococcus agalactiae NOT DETECTED NOT DETECTED Final   Streptococcus pneumoniae NOT DETECTED NOT DETECTED Final   Streptococcus pyogenes NOT DETECTED NOT DETECTED Final   Acinetobacter baumannii NOT DETECTED NOT DETECTED Final   Enterobacteriaceae species NOT DETECTED NOT DETECTED Final   Enterobacter cloacae complex NOT DETECTED NOT DETECTED Final   Escherichia coli NOT DETECTED NOT DETECTED Final   Klebsiella oxytoca NOT DETECTED NOT DETECTED Final   Klebsiella pneumoniae NOT DETECTED NOT DETECTED Final   Proteus species NOT DETECTED NOT DETECTED Final   Serratia marcescens NOT DETECTED NOT DETECTED Final   Carbapenem resistance NOT DETECTED NOT DETECTED Final   Haemophilus influenzae NOT DETECTED NOT DETECTED Final   Neisseria meningitidis NOT DETECTED NOT DETECTED Final   Pseudomonas aeruginosa NOT DETECTED NOT DETECTED Final   Candida albicans NOT DETECTED NOT DETECTED Final   Candida glabrata NOT DETECTED NOT DETECTED Final   Candida krusei NOT DETECTED NOT DETECTED Final   Candida parapsilosis NOT DETECTED NOT DETECTED Final   Candida tropicalis NOT DETECTED NOT DETECTED Final  Blood Culture (routine x 2)     Status: None (Preliminary result)   Collection Time: 03/18/16  2:35 AM  Result Value Ref Range Status   Specimen Description BLOOD RIGHT HAND  Final   Special Requests IN PEDIATRIC BOTTLE  Final   Culture NO GROWTH 1 DAY  Final   Report Status PENDING  Incomplete  Blood Culture (routine x 2)     Status: None (Preliminary result)   Collection Time: 03/18/16  3:08 AM  Result Value Ref Range Status   Specimen Description BLOOD LEFT ARM  Final   Special Requests BOTTLES DRAWN AEROBIC AND ANAEROBIC   Final   Culture NO GROWTH 1 DAY  Final   Report Status PENDING   Incomplete  Urine culture     Status: None   Collection Time: 03/18/16  3:50 AM  Result Value Ref Range Status   Specimen Description URINE, CATHETERIZED  Final   Special Requests NONE  Final   Culture NO GROWTH  Final   Report Status 03/19/2016 FINAL  Final         Radiology Studies: Ct Head Wo Contrast  03/19/2016  CLINICAL DATA:  Altered mental status EXAM: CT HEAD WITHOUT CONTRAST TECHNIQUE: Contiguous axial images were obtained from the base of the skull through the vertex without intravenous contrast. COMPARISON:  03/16/2016 FINDINGS: The examination is somewhat limited by patient motion artifact. The bony calvarium is intact. Atrophic changes are noted. Chronic white matter ischemic change is again seen. Bilateral basal ganglia calcifications are noted. No findings to suggest acute hemorrhage, acute infarction or space-occupying mass lesion are noted. IMPRESSION: Chronic atrophic and ischemic changes without acute intracranial abnormality. Electronically Signed   By: Alcide Clever M.D.   On: 03/19/2016 14:15   Dg Chest Port 1 View  03/19/2016  CLINICAL DATA:  Atelectasis,hx htn,dm,esrd EXAM: PORTABLE CHEST 1 VIEW COMPARISON:  03/18/2016 FINDINGS: Mild improvement in the left basilar atelectasis. Lungs otherwise clear. No pneumothorax. Heart and mediastinum are unremarkable. Right internal jugular dual-lumen central venous catheter and right-sided sequential pacemaker are stable and well positioned. IMPRESSION: 1. No acute findings.  Improved left lung base atelectasis. Electronically Signed   By: Amie Portland M.D.   On: 03/19/2016 08:02        Scheduled Meds: . amLODipine  5 mg Oral QHS  . calcitRIOL  1 mcg Oral Q M,W,F-HD  . calcium acetate  2,001 mg Oral TID WC  . divalproex  500 mg Oral Daily  . feeding supplement (NEPRO CARB STEADY)  237 mL Oral BID BM  . folic acid  1 mg Oral Daily  . gabapentin  100 mg Oral TID  . lacosamide  150 mg Oral BID  . loratadine  10 mg Oral  Daily  . multivitamin  1 tablet Oral QHS  . piperacillin-tazobactam (ZOSYN)  IV  2.25 g Intravenous Q8H  . senna-docusate  1 tablet Oral QPM  . vancomycin  1,000 mg Intravenous Q M,W,F-HD   Continuous Infusions:    LOS: 2 days    Time spent: 25 minutes    Debe Coder, MD [facility] Pager [phone]  If 7PM-7AM, please contact night-coverage [email or web address] Password [facility] 03/20/2016, 10:54 AM

## 2016-03-20 NOTE — Progress Notes (Signed)
Patient is refusing his 2200 medications as well as CBG checks. RN notified on call NP. RN will continue to monitor patient.   Veatrice Kells, RN

## 2016-03-20 NOTE — Progress Notes (Signed)
NT informed RN that patient is agitated and started hitting her when she was trying to obtain vitals. RN went to speak with patient and tried to calm patient, but patient became more agitated and started hitting RN as well as telling her to leave him alone. RN will continue to monitor patient.   Veatrice Kells, RN

## 2016-03-21 DIAGNOSIS — I120 Hypertensive chronic kidney disease with stage 5 chronic kidney disease or end stage renal disease: Secondary | ICD-10-CM | POA: Diagnosis not present

## 2016-03-21 DIAGNOSIS — J9811 Atelectasis: Secondary | ICD-10-CM | POA: Diagnosis not present

## 2016-03-21 DIAGNOSIS — Z66 Do not resuscitate: Secondary | ICD-10-CM | POA: Diagnosis not present

## 2016-03-21 DIAGNOSIS — Z950 Presence of cardiac pacemaker: Secondary | ICD-10-CM | POA: Diagnosis not present

## 2016-03-21 DIAGNOSIS — D696 Thrombocytopenia, unspecified: Secondary | ICD-10-CM | POA: Diagnosis not present

## 2016-03-21 DIAGNOSIS — N186 End stage renal disease: Secondary | ICD-10-CM | POA: Diagnosis not present

## 2016-03-21 DIAGNOSIS — Z992 Dependence on renal dialysis: Secondary | ICD-10-CM | POA: Diagnosis not present

## 2016-03-21 DIAGNOSIS — N2581 Secondary hyperparathyroidism of renal origin: Secondary | ICD-10-CM | POA: Diagnosis not present

## 2016-03-21 DIAGNOSIS — Z794 Long term (current) use of insulin: Secondary | ICD-10-CM | POA: Diagnosis not present

## 2016-03-21 DIAGNOSIS — R509 Fever, unspecified: Secondary | ICD-10-CM | POA: Diagnosis not present

## 2016-03-21 DIAGNOSIS — I272 Other secondary pulmonary hypertension: Secondary | ICD-10-CM | POA: Diagnosis not present

## 2016-03-21 DIAGNOSIS — E8889 Other specified metabolic disorders: Secondary | ICD-10-CM | POA: Diagnosis not present

## 2016-03-21 DIAGNOSIS — E1122 Type 2 diabetes mellitus with diabetic chronic kidney disease: Secondary | ICD-10-CM | POA: Diagnosis not present

## 2016-03-21 DIAGNOSIS — D638 Anemia in other chronic diseases classified elsewhere: Secondary | ICD-10-CM | POA: Diagnosis not present

## 2016-03-21 DIAGNOSIS — I5032 Chronic diastolic (congestive) heart failure: Secondary | ICD-10-CM | POA: Diagnosis not present

## 2016-03-21 DIAGNOSIS — R633 Feeding difficulties: Secondary | ICD-10-CM | POA: Diagnosis not present

## 2016-03-21 DIAGNOSIS — G9340 Encephalopathy, unspecified: Secondary | ICD-10-CM | POA: Diagnosis not present

## 2016-03-21 DIAGNOSIS — R4182 Altered mental status, unspecified: Secondary | ICD-10-CM | POA: Diagnosis not present

## 2016-03-21 DIAGNOSIS — Z981 Arthrodesis status: Secondary | ICD-10-CM | POA: Diagnosis not present

## 2016-03-21 LAB — CULTURE, BLOOD (ROUTINE X 2): Culture: NO GROWTH

## 2016-03-21 LAB — GLUCOSE, CAPILLARY
GLUCOSE-CAPILLARY: 128 mg/dL — AB (ref 65–99)
GLUCOSE-CAPILLARY: 160 mg/dL — AB (ref 65–99)

## 2016-03-21 MED ORDER — NEPRO/CARBSTEADY PO LIQD: 237.0000 mL | Freq: Two times a day (BID) | ORAL | Status: AC

## 2016-03-21 MED ORDER — DIVALPROEX SODIUM ER 500 MG PO TB24: 500.0000 mg | ORAL_TABLET | Freq: Every day | ORAL | Status: AC

## 2016-03-21 MED ORDER — VANCOMYCIN HCL IN DEXTROSE 1-5 GM/200ML-% IV SOLN
1000.0000 mg | INTRAVENOUS | Status: DC
Start: 2016-03-21 — End: 2016-04-06

## 2016-03-21 NOTE — Progress Notes (Signed)
Pharmacy Antibiotic Note  [name] is a 82 y.o. male admitted on 03/18/2016 with sepsis.  Pharmacy has been consulted for Vancomycin and Zosyn dosing. Noted pt with ESRD - HD as o/p on M/W/F. 1/2 bcx on 7/11 came back as CNS so it's likely to be a contaminant. Repeat on 7/13 are neg. Pt is planned to be dced home today.   Plan: Abx will be dced at discharge  Height: 6' (182.9 cm) Weight: 198 lb (89.812 kg) IBW/kg (Calculated) : 77.6  Temp (24hrs), Avg:98.5 F (36.9 C), Min:98 F (36.7 C), Max:98.8 F (37.1 C)   Recent Labs Lab 03/16/16 0214 03/16/16 0220 03/16/16 0512 03/17/16 0343 03/18/16 0239 03/18/16 0244 03/18/16 0525 03/19/16 0504 03/19/16 0755 03/20/16 0557  WBC 3.1*  --   --  2.4* 3.5*  --   --  3.4*  --  3.8*  CREATININE 7.11*  --   --  8.26* 5.95*  --   --  7.29* 7.27*  --   LATICACIDVEN  --  1.20 0.54  --   --  1.80 0.61  --   --   --     Estimated Creatinine Clearance: 8.6 mL/min (by C-G formula based on Cr of 7.27).    Allergies  Allergen Reactions  . Aspirin Other (See Comments)    Bleeding   . Lactose Intolerance (Gi) Diarrhea    Antimicrobials this admission: 7/13 Vanc >>  7/13 Zosyn >>   Dose adjustments this admission: n/a  Microbiology results: 7/13 BCx x2:  7/13 UCx:    Ulyses Southward, PharmD Pager: [phone] 03/21/2016 2:05 PM

## 2016-03-21 NOTE — Progress Notes (Addendum)
PROGRESS NOTE    [name]  EGB:[id number] DOB: 12-09-1931 DOA: 03/18/2016 PCP: [name], DO    Brief Narrative:   82 year old male with history of subdural hematoma, seizure, ESRD on HD MWF, DM and HTN, was discharged from this hospital on 7/12 after in hospital stay and management for encephalopathy and possible seizure. Patient was seen by Neurologist, and anti-seizure medication adjusted. But patient developed high fever and became encephalopathic and was brought to ED for further evaluation.   Blood cultures from 7/11 revealed one set growing methicillin resistent staph epi, currently on appropriate antibiotics.    Assessment & Plan:   Encephalopathy - Improved to baseline today - Treat infection as below - CT head on 7/11 and 7/14 showed no acute issues.  I do not think his status has changed significantly to warrant another CT head - AST was mildly elevated, he refused repeat labs this morning.     Pyrexia, BC + for staph epi - BC on 7/11 showed coag neg staph which was noted to be methicillin resistant - Transition to vancomycin only on discharge, will receive 3 doses with dialysis this week.  - Though bacteria growing is usually a contaminant, he improved significantly with antibiotics and defervescence.  Given his debility, age and co-morbidities, I will err on the side of treating him with vancomycin for 3 days during dialysis.    ESRD on HD - Renal following, HD as scheduled by that service  DM2 on insulin - Resume home SS on discharge  HTN - Home amlodipine, BP improved today - Add PRN hydralazine, consider scheduled if still elevated  Seizure disorder - Lacosamide and Depakote  - Depakote level low on this admission   DVT prophylaxis: SCDs (h/o SDH) Code Status: Full Family Discussion: Wife at bedside  Disposition Plan: Discharge today with home health services   Consultants:   Nephrology  Procedures:   None  Antimicrobials:    Vancomycin 7/13 --> 7/22 (stop date)  Zosyn 7/13 --> 7/16   Subjective: [name] was much more interactive today. He notes that he is ready to go home.   Objective: Filed Vitals:   03/20/16 1711 03/20/16 2058 03/21/16 0618 03/21/16 0754  BP: 157/56  148/43 118/91  Pulse: 69 84  71  Temp: 98.7 F (37.1 C)  98.8 F (37.1 C) 98 F (36.7 C)  TempSrc: Oral Oral Oral Oral  Resp: 17 18 16 18   Height:      Weight:  198 lb (89.812 kg)    SpO2: 94%  99% 100%    Intake/Output Summary (Last 24 hours) at 03/21/16 1723 Last data filed at 03/21/16 1401  Gross per 24 hour  Intake    480 ml  Output    400 ml  Net     80 ml   Filed Weights   03/19/16 1109 03/19/16 2111 03/20/16 2058  Weight: 198 lb 6.6 oz (90 kg) 197 lb 15.6 oz (89.8 kg) 198 lb (89.812 kg)    Examination:  General exam: Appears calm and comfortable, Much more interactive, followed conversation but had some echolalia HENT: Neck supple, NCAT Respiratory system: Respiratory effort normal.  No audible wheezing Cardiovascular system: S1 & S2 heard, RR, NR.  Gastrointestinal system: Abdomen is nondistended, soft and nontender. +BS Central nervous system: Alert and oriented to place, not to time or situation. No focal neurological deficits. GU: condom catheter in place Skin: No rashes, lesions or ulcers noted    Data Reviewed:  CBC:  Recent Labs Lab 03/16/16 0214 03/17/16 0343 03/18/16 0239 03/19/16 0504 03/20/16 0557  WBC 3.1* 2.4* 3.5* 3.4* 3.8*  NEUTROABS 2.0  --  2.4  --   --   HGB 12.0* 11.7* 12.9* 11.4* 12.0*  HCT 36.4* 35.9* 41.1 34.9* 37.6*  MCV 98.4 98.9 100.0 98.6 99.5  PLT 54* 45* 33* 51* 48*   Basic Metabolic Panel:  Recent Labs Lab 03/16/16 0214 03/17/16 0343 03/18/16 0239 03/18/16 0841 03/19/16 0504 03/19/16 0755  NA 136 139 137  --  137 136  K 4.0 4.4 5.0  --  4.8 4.7  CL 101 104 101  --  101 100*  CO2 --  25 24  GLUCOSE 163* 187* 223*  --  148* 148*  BUN 50* 63*  39*  --  49* 50*  CREATININE 7.11* 8.26* 5.95*  --  7.29* 7.27*  CALCIUM 9.5 9.2 9.1  --  9.1 8.9  MG  --   --   --  2.0  --   --   PHOS  --   --   --  3.3  --  4.5   GFR: Estimated Creatinine Clearance: 8.6 mL/min (by C-G formula based on Cr of 7.27). Liver Function Tests:  Recent Labs Lab 03/16/16 0214 03/18/16 0239 03/19/16 0755  AST 30 50*  --   ALT 22 33  --   ALKPHOS 68 70  --   BILITOT 0.8 0.9  --   PROT 7.2 6.9  --   ALBUMIN 3.4* 3.2* 2.6*   No results for input(s): LIPASE, AMYLASE in the last 168 hours.  Recent Labs Lab 03/16/16 0934  AMMONIA 36*   Coagulation Profile: No results for input(s): INR, PROTIME in the last 168 hours. Cardiac Enzymes:  Recent Labs Lab 03/16/16 0934  CKTOTAL 83   BNP (last 3 results) No results for input(s): PROBNP in the last 8760 hours. HbA1C: No results for input(s): HGBA1C in the last 72 hours. CBG:  Recent Labs Lab 03/17/16 1148 03/18/16 0226 03/20/16 1755 03/21/16 0750 03/21/16 1132  GLUCAP 181* 203* 166* 128* 160*   Lipid Profile: No results for input(s): CHOL, HDL, LDLCALC, TRIG, CHOLHDL, LDLDIRECT in the last 72 hours. Thyroid Function Tests: No results for input(s): TSH, T4TOTAL, FREET4, T3FREE, THYROIDAB in the last 72 hours. Anemia Panel: No results for input(s): VITAMINB12, FOLATE, FERRITIN, TIBC, IRON, RETICCTPCT in the last 72 hours. Sepsis Labs:  Recent Labs Lab 03/16/16 0220 03/16/16 0512 03/18/16 0244 03/18/16 0525  LATICACIDVEN 1.20 0.54 1.80 0.61    Recent Results (from the past 240 hour(s))  Blood culture (routine x 2)     Status: None   Collection Time: 03/16/16  2:00 AM  Result Value Ref Range Status   Specimen Description BLOOD RIGHT HAND  Final   Special Requests IN PEDIATRIC BOTTLE  Final   Culture NO GROWTH 5 DAYS  Final   Report Status 03/21/2016 FINAL  Final  Blood culture (routine x 2)     Status: Abnormal   Collection Time: 03/16/16  2:14 AM  Result Value Ref Range  Status   Specimen Description BLOOD LEFT HAND  Final   Special Requests BOTTLES DRAWN AEROBIC AND ANAEROBIC  Final   Culture  Setup Time   Final    GRAM POSITIVE COCCI IN CLUSTERS AEROBIC BOTTLE ONLY CRITICAL RESULT CALLED TO, READ BACK BY AND VERIFIED WITH: K. HAMMONS RPH AT 1610 03/18/16 BY D. Leighton Roach  Culture (A)  Final    STAPHYLOCOCCUS SPECIES (COAGULASE NEGATIVE) THE SIGNIFICANCE OF ISOLATING THIS ORGANISM FROM A SINGLE SET OF BLOOD CULTURES WHEN MULTIPLE SETS ARE DRAWN IS UNCERTAIN. PLEASE NOTIFY THE MICROBIOLOGY DEPARTMENT WITHIN ONE WEEK IF SPECIATION AND SENSITIVITIES ARE REQUIRED.    Report Status 03/19/2016 FINAL  Final  Blood Culture ID Panel (Reflexed)     Status: Abnormal   Collection Time: 03/16/16  2:14 AM  Result Value Ref Range Status   Enterococcus species NOT DETECTED NOT DETECTED Final   Vancomycin resistance NOT DETECTED NOT DETECTED Final   Listeria monocytogenes NOT DETECTED NOT DETECTED Final   Staphylococcus species DETECTED (A) NOT DETECTED Final    Comment: CRITICAL RESULT CALLED TO, READ BACK BY AND VERIFIED WITH: K. HAMMONS RPH AT 0733 03/18/16 BY D. VANHOOK    Staphylococcus aureus NOT DETECTED NOT DETECTED Final   Methicillin resistance DETECTED (A) NOT DETECTED Final    Comment: CRITICAL RESULT CALLED TO, READ BACK BY AND VERIFIED WITH: K. HAMMONS RPH AT 9470 03/18/16 BY D. VANHOOK    Streptococcus species NOT DETECTED NOT DETECTED Final   Streptococcus agalactiae NOT DETECTED NOT DETECTED Final   Streptococcus pneumoniae NOT DETECTED NOT DETECTED Final   Streptococcus pyogenes NOT DETECTED NOT DETECTED Final   Acinetobacter baumannii NOT DETECTED NOT DETECTED Final   Enterobacteriaceae species NOT DETECTED NOT DETECTED Final   Enterobacter cloacae complex NOT DETECTED NOT DETECTED Final   Escherichia coli NOT DETECTED NOT DETECTED Final   Klebsiella oxytoca NOT DETECTED NOT DETECTED Final   Klebsiella pneumoniae NOT DETECTED NOT DETECTED  Final   Proteus species NOT DETECTED NOT DETECTED Final   Serratia marcescens NOT DETECTED NOT DETECTED Final   Carbapenem resistance NOT DETECTED NOT DETECTED Final   Haemophilus influenzae NOT DETECTED NOT DETECTED Final   Neisseria meningitidis NOT DETECTED NOT DETECTED Final   Pseudomonas aeruginosa NOT DETECTED NOT DETECTED Final   Candida albicans NOT DETECTED NOT DETECTED Final   Candida glabrata NOT DETECTED NOT DETECTED Final   Candida krusei NOT DETECTED NOT DETECTED Final   Candida parapsilosis NOT DETECTED NOT DETECTED Final   Candida tropicalis NOT DETECTED NOT DETECTED Final  Blood Culture (routine x 2)     Status: None (Preliminary result)   Collection Time: 03/18/16  2:35 AM  Result Value Ref Range Status   Specimen Description BLOOD RIGHT HAND  Final   Special Requests IN PEDIATRIC BOTTLE  Final   Culture NO GROWTH 3 DAYS  Final   Report Status PENDING  Incomplete  Blood Culture (routine x 2)     Status: None (Preliminary result)   Collection Time: 03/18/16  3:08 AM  Result Value Ref Range Status   Specimen Description BLOOD LEFT ARM  Final   Special Requests BOTTLES DRAWN AEROBIC AND ANAEROBIC  Final   Culture NO GROWTH 3 DAYS  Final   Report Status PENDING  Incomplete  Urine culture     Status: None   Collection Time: 03/18/16  3:50 AM  Result Value Ref Range Status   Specimen Description URINE, CATHETERIZED  Final   Special Requests NONE  Final   Culture NO GROWTH  Final   Report Status 03/19/2016 FINAL  Final         Radiology Studies: No results found.      Scheduled Meds: . amLODipine  5 mg Oral QHS  . calcitRIOL  1 mcg Oral Q M,W,F-HD  . calcium acetate  2,001 mg Oral TID WC  .  divalproex  500 mg Oral Daily  . feeding supplement (NEPRO CARB STEADY)  237 mL Oral BID BM  . folic acid  1 mg Oral Daily  . gabapentin  100 mg Oral TID  . insulin aspart  0-5 Units Subcutaneous QHS  . insulin aspart  0-9 Units Subcutaneous TID WC  .  lacosamide  150 mg Oral BID  . loratadine  10 mg Oral Daily  . multivitamin  1 tablet Oral QHS  . piperacillin-tazobactam (ZOSYN)  IV  2.25 g Intravenous Q8H  . senna-docusate  1 tablet Oral QPM  . vancomycin  1,000 mg Intravenous Q M,W,F-HD   Continuous Infusions:    LOS: 3 days    Time spent: 40 minutes    Debe Coder, MD [facility] Pager [phone]  If 7PM-7AM, please contact night-coverage [email or web address] Password [facility] 03/21/2016, 5:23 PM

## 2016-03-21 NOTE — Progress Notes (Signed)
[facility] ROUNDING NOTE   Subjective:   Interval History: wants to go home today  Objective:  Vital signs in last 24 hours:  Temp:  [98 F (36.7 C)-98.8 F (37.1 C)] 98 F (36.7 C) (07/16 0754) Pulse Rate:  [69-84] 71 (07/16 0754) Resp:  [16-18] 18 (07/16 0754) BP: (118-157)/(43-91) 118/91 mmHg (07/16 0754) SpO2:  [94 %-100 %] 100 % (07/16 0754) Weight:  [89.812 kg (198 lb)] 89.812 kg (198 lb) (07/15 2058)  Weight change: -1.188 kg (-2 lb 9.9 oz) Filed Weights   03/19/16 1109 03/19/16 2111 03/20/16 2058  Weight: 90 kg (198 lb 6.6 oz) 89.8 kg (197 lb 15.6 oz) 89.812 kg (198 lb)    Intake/Output: I/O last 3 completed shifts: In: 480 [P.O.:480] Out: 410 [Urine:410]   Intake/Output this shift:     General: awake, but not answering questions  Heart:RRR Lungs: Abdomen: Extremities: Dialysis Access: right IJ  Dialysis Orders: [facility] MWF 3.75 180 400/800 EDW 90 2 K 2.25 Ca profile 4 right IJ active NO heparin except in catheter venofer 50/week 7/5, Mircera 100 q 2 weeks - last 7/5 Calcitriol 0.25 Recent labs: hgb 11.2 41% sat iPTH 251 Corr Ca 10.2 P OK   Basic Metabolic Panel:  Recent Labs Lab 03/16/16 0214 03/17/16 0343 03/18/16 0239 03/18/16 0841 03/19/16 0504 03/19/16 0755  NA 136 139 137  --  137 136  K 4.0 4.4 5.0  --  4.8 4.7  CL 101 104 101  --  101 100*  CO2 24 24 26   --  25 24  GLUCOSE 163* 187* 223*  --  148* 148*  BUN 50* 63* 39*  --  49* 50*  CREATININE 7.11* 8.26* 5.95*  --  7.29* 7.27*  CALCIUM 9.5 9.2 9.1  --  9.1 8.9  MG  --   --   --  2.0  --   --   PHOS  --   --   --  3.3  --  4.5    Liver Function Tests:  Recent Labs Lab 03/16/16 0214 03/18/16 0239 03/19/16 0755  AST 30 50*  --   ALT 22 33  --   ALKPHOS 68 70  --   BILITOT 0.8 0.9  --   PROT 7.2 6.9  --   ALBUMIN 3.4* 3.2* 2.6*   No results for input(s): LIPASE, AMYLASE in the last 168 hours.  Recent Labs Lab 03/16/16 0934  AMMONIA 36*     CBC:  Recent Labs Lab 03/16/16 0214 03/17/16 0343 03/18/16 0239 03/19/16 0504 03/20/16 0557  WBC 3.1* 2.4* 3.5* 3.4* 3.8*  NEUTROABS 2.0  --  2.4  --   --   HGB 12.0* 11.7* 12.9* 11.4* 12.0*  HCT 36.4* 35.9* 41.1 34.9* 37.6*  MCV 98.4 98.9 100.0 98.6 99.5  PLT 54* 45* 33* 51* 48*    Cardiac Enzymes:  Recent Labs Lab 03/16/16 0934  CKTOTAL 83    BNP: Invalid input(s): POCBNP  CBG:  Recent Labs Lab 03/16/16 1957 03/17/16 1148 03/18/16 0226 03/20/16 1755 03/21/16 0750  GLUCAP 115* 181* 203* 166* 128*    Microbiology: Results for orders placed or performed during the hospital encounter of 03/18/16  Blood Culture (routine x 2)     Status: None (Preliminary result)   Collection Time: 03/18/16  2:35 AM  Result Value Ref Range Status   Specimen Description BLOOD RIGHT HAND  Final   Special Requests IN PEDIATRIC BOTTLE  Final   Culture NO GROWTH  2 DAYS  Final   Report Status PENDING  Incomplete  Blood Culture (routine x 2)     Status: None (Preliminary result)   Collection Time: 03/18/16  3:08 AM  Result Value Ref Range Status   Specimen Description BLOOD LEFT ARM  Final   Special Requests BOTTLES DRAWN AEROBIC AND ANAEROBIC  Final   Culture NO GROWTH 2 DAYS  Final   Report Status PENDING  Incomplete  Urine culture     Status: None   Collection Time: 03/18/16  3:50 AM  Result Value Ref Range Status   Specimen Description URINE, CATHETERIZED  Final   Special Requests NONE  Final   Culture NO GROWTH  Final   Report Status 03/19/2016 FINAL  Final    Coagulation Studies: No results for input(s): LABPROT, INR in the last 72 hours.  Urinalysis: No results for input(s): COLORURINE, LABSPEC, PHURINE, GLUCOSEU, HGBUR, BILIRUBINUR, KETONESUR, PROTEINUR, UROBILINOGEN, NITRITE, LEUKOCYTESUR in the last 72 hours.  Invalid input(s): APPERANCEUR    Imaging: Ct Head Wo Contrast  03/19/2016  CLINICAL DATA:  Altered mental status EXAM: CT HEAD WITHOUT  CONTRAST TECHNIQUE: Contiguous axial images were obtained from the base of the skull through the vertex without intravenous contrast. COMPARISON:  03/16/2016 FINDINGS: The examination is somewhat limited by patient motion artifact. The bony calvarium is intact. Atrophic changes are noted. Chronic white matter ischemic change is again seen. Bilateral basal ganglia calcifications are noted. No findings to suggest acute hemorrhage, acute infarction or space-occupying mass lesion are noted. IMPRESSION: Chronic atrophic and ischemic changes without acute intracranial abnormality. Electronically Signed   By: Alcide Clever M.D.   On: 03/19/2016 14:15     Medications:     . amLODipine  5 mg Oral QHS  . calcitRIOL  1 mcg Oral Q M,W,F-HD  . calcium acetate  2,001 mg Oral TID WC  . divalproex  500 mg Oral Daily  . feeding supplement (NEPRO CARB STEADY)  237 mL Oral BID BM  . folic acid  1 mg Oral Daily  . gabapentin  100 mg Oral TID  . insulin aspart  0-5 Units Subcutaneous QHS  . insulin aspart  0-9 Units Subcutaneous TID WC  . lacosamide  150 mg Oral BID  . loratadine  10 mg Oral Daily  . multivitamin  1 tablet Oral QHS  . piperacillin-tazobactam (ZOSYN)  IV  2.25 g Intravenous Q8H  . senna-docusate  1 tablet Oral QPM  . vancomycin  1,000 mg Intravenous Q M,W,F-HD   hydrALAZINE  Assessment/ Plan:  1. Acute febrile illness with exncephalopathy- -Tmax 98.9 high risk for infection given TDC and recent hospitalization, also has PM - has been pancultured and placed on empiric Vanc and Zosyn; AMS waxes and wans. Alert and oriented Thursday. Today, rouses acted alert but not answering my questions. Nursing assistant fed him a bite of eggs and he chewed and chewed but would not swallow when insturcted to do so. BC from 7/11 prior OBS admission now 1/2 + BC MR staph species; 7/13 BC x 2 no growth on appropriate coverage Head CT 7/14 no acute findings 2. ESRD - MWF - HD - had HD Friday without  incident.- next HD Monday 3. Hypertension/volume - BP variable - no overt volume on exame-no volume on CXR 7/13 and 7/14 Net UF 1 L ; Post HD wt 90 - at EDW 4. Anemia - 12.- Hold redosing ESA And Fe for now 5. Metabolic bone disease - Controlled - Continue calcitriol /  binders 6. Nutrition -change to renal carb mod diet - due to high K in heart healthy diet /vits/nepro- little intake recorded for Friday 7. Seizure disorder 8. DM 9. Dementia 10. Thrombocytopenia - chronic but lower than usual, likely due to acute illness - not on heparin except in catheter - repeat plts with HD Friday 11. Feeding difficulties - reported to RN and have discussed with primary     LOS: 3 Anett Ranker W @TODAY @9 :53 AM

## 2016-03-21 NOTE — Progress Notes (Signed)
Patient has refused for lab draws this morning. On call NP notified.  Veatrice Kells, RN

## 2016-03-21 NOTE — Progress Notes (Signed)
Discharge instructions and medications discussed with [name], patient's son.  Prescription given to [facility].  All questions answered.

## 2016-03-22 DIAGNOSIS — D689 Coagulation defect, unspecified: Secondary | ICD-10-CM | POA: Diagnosis not present

## 2016-03-22 DIAGNOSIS — D631 Anemia in chronic kidney disease: Secondary | ICD-10-CM | POA: Diagnosis not present

## 2016-03-22 DIAGNOSIS — N186 End stage renal disease: Secondary | ICD-10-CM | POA: Diagnosis not present

## 2016-03-22 DIAGNOSIS — Z452 Encounter for adjustment and management of vascular access device: Secondary | ICD-10-CM | POA: Diagnosis not present

## 2016-03-22 DIAGNOSIS — A4902 Methicillin resistant Staphylococcus aureus infection, unspecified site: Secondary | ICD-10-CM | POA: Diagnosis not present

## 2016-03-22 DIAGNOSIS — D509 Iron deficiency anemia, unspecified: Secondary | ICD-10-CM | POA: Diagnosis not present

## 2016-03-22 DIAGNOSIS — E1129 Type 2 diabetes mellitus with other diabetic kidney complication: Secondary | ICD-10-CM | POA: Diagnosis not present

## 2016-03-22 NOTE — Discharge Summary (Signed)
Physician Discharge Summary  Micajah Dennin WNU:[id number] DOB: 12/24/1931 DOA: 03/18/2016  PCP: [name], DO  Admit date: 03/18/2016 Discharge date: 03/24/2016  Admitted From: Home Disposition:  Home with home health  Recommendations for Outpatient Follow-up:  1. Follow up with PCP in 1-2 weeks 2. Please obtain CMP/CBC in one week if appropriate 3. Vancomycin dosing in HD for 1 week, stop date 7/22.   Home Health: Yes Equipment/Devices: No  Discharge Condition: Stable CODE STATUS: Full Diet recommendation:  Low salt diet   Brief/Interim Summary:  82 year old male with history of subdural hematoma, seizure, ESRD on HD MWF, DM and HTN, was discharged from this hospital on 7/12 after in hospital stay and management for encephalopathy and possible seizure. Patient was seen by Neurologist, and anti-seizure medication adjusted. But patient developed high fever and became encephalopathic and was brought to ED for further evaluation.  Blood cultures from 7/11 revealed one set growing methicillin resistent staph epi, currently on appropriate antibiotics.   Discharge Diagnoses:  Encephalopathy: Unclear cause on admission.  DDx included occult infection, seizures, sedative medications, metabolic derangement.  CBC showed a low WBC, which appears to be chronic for him, CMET showed an elevated AST (mild at 50) and an elevated BUN (on HD).  CT scan of the head X 2 showed no acute changes, he has known chronic changes as noted in the reports below.  BC from day of admission showed methicillin resistant staph epidermidis in 1 bottle only.  UC showed NG.  Repeat cultures were no growth.  CXR X 2 showed only left lung atelectasis.  On day of discharge, he had recovered and was back to baseline.  If this should recur, consideration should be made for acute infection again, also oversedation from anti-seizure medications. Of note, Speech Therapy evaluated the patient and noted only a mild aspiration risk.      Pyrexia, BC + for staph epi:  Tmax on Admission was 102.25F.  BC on 7/11 showed coag neg staph which was noted to be methicillin resistant.  Though bacteria growing is usually a contaminant, he improved significantly with antibiotics and defervescence. Given his debility, age and co-morbidities, I will err on the side of treating him with vancomycin for 3 days during dialysis. His stop date for Abx was 7/22.  I discussed with Nephrology team on day of discharge and he should get doses during HD.    ESRD on HD:  Had HD while in house under the care of the nephrology service.  He tolerated HD well.   DM2 on insulin:  He is on a SS at home for insulin.  He was on the inpatient SS regimen while in house and did well.  Resume home SS on discharge.    HTN:  After initial presentation, BP improved and he was restarted on home amlodipine.   Seizure disorder:  Lacosamide and Depakote were continued.  He improved with Abx alone, so no EEG was done.  If he should present with a similar encephalopathy, would consider EEG.    Anemia: Likely related to chronic disease.  Mildly low and stable on admission  Thrombocytopenia: Chronic, has been low since January of 2017 at least and in this range since June of this year.  Stable from 30s-50 while in hospital.  Follow up outpatient.     Discharge Instructions  Discharge Instructions    Call MD for:  difficulty breathing, headache or visual disturbances    Complete by:  As directed  Call MD for:  persistant dizziness or light-headedness    Complete by:  As directed      Call MD for:  persistant nausea and vomiting    Complete by:  As directed      Call MD for:  redness, tenderness, or signs of infection (pain, swelling, redness, odor or green/yellow discharge around incision site)    Complete by:  As directed      Call MD for:  temperature >100.4    Complete by:  As directed      Diet - low sodium heart healthy    Complete by:  As directed       Discharge instructions    Complete by:  As directed   Mr. Colasanti - -  You will be going home today and will have home health services.  You will have vancomycin with dialysis.    Follow with [name], [name], DO in 7-14 days.  Continue going to dialysis as scheduled  Get blood work to follow your vancomycin levels as needed at dialysis.   Activity: As tolerated with Full fall precautions use walker/cane & assistance as needed   Disposition Home with home health   Diet:   Low salt diet.   On your next visit with your primary care physician please Get Medicines reviewed and adjusted.   If you experience worsening of your admission symptoms, develop shortness of breath, life threatening emergency, suicidal or homicidal thoughts you must seek medical attention immediately by calling 911 or calling your MD immediately  if symptoms less severe.  You Must read complete instructions/literature along with all the possible adverse reactions/side effects for all the Medicines you take and that have been prescribed to you. Take any new Medicines after you have completely understood and accept all the possible adverse reactions/side effects.    Do not drive when taking Pain medications.    Do not take more than prescribed Pain, Sleep and Anxiety Medications  Wear Seat belts while driving.   Please note  You were cared for by a hospitalist during your hospital stay. If you have any questions about your discharge medications or the care you received while you were in the hospital after you are discharged, you can call the unit and asked to speak with the hospitalist on call if the hospitalist that took care of you is not available. Once you are discharged, your primary care physician will handle any further medical issues. Please note that NO REFILLS for any discharge medications will be authorized once you are discharged, as it is imperative that you return to your primary care physician (or  establish a relationship with a primary care physician if you do not have one) for your aftercare needs so that they can reassess your need for medications and monitor your lab values.     Increase activity slowly    Complete by:  As directed             Medication List    TAKE these medications        amLODipine 5 MG tablet  Commonly known as:  NORVASC  Take 1 tablet (5 mg total) by mouth at bedtime.     calcitRIOL 0.5 MCG capsule  Commonly known as:  ROCALTROL  Take 2 capsules (1 mcg total) by mouth every Monday, Wednesday, and Friday with hemodialysis.     calcium acetate 667 MG capsule  Commonly known as:  PHOSLO  Take 3 capsules by mouth three times a  day     divalproex 500 MG 24 hr tablet  Commonly known as:  DEPAKOTE ER  Take 1 tablet (500 mg total) by mouth daily.     feeding supplement (NEPRO CARB STEADY) Liqd  Take 237 mLs by mouth 2 (two) times daily between meals.     fexofenadine 180 MG tablet  Commonly known as:  ALLEGRA  Take 1 tablet (180 mg total) by mouth daily as needed for allergies or rhinitis.     folic acid 1 MG tablet  Commonly known as:  FOLVITE  Take 1 tablet (1 mg total) by mouth daily.     gabapentin 100 MG capsule  Commonly known as:  NEURONTIN  Take 1 capsule (100 mg total) by mouth 3 (three) times daily.     glucose blood test strip  Commonly known as:  ACCU-CHEK AVIVA PLUS  1 each by Other route 3 (three) times daily. Use as instructed     HUMALOG KWIKPEN 100 UNIT/ML KiwkPen  Generic drug:  insulin lispro  Inject 5 Units into the skin See admin instructions. Per sliding scale     Insulin Pen Needle 31G X 8 MM Misc  Use as directed with insulin pens     Lacosamide 150 MG Tabs  Commonly known as:  VIMPAT  Take 1 tablet (150 mg total) by mouth 2 (two) times daily.     multivitamin Tabs tablet  Take 1 tablet by mouth at bedtime.     pantoprazole 40 MG tablet  Commonly known as:  PROTONIX  Take 1 tablet (40 mg total) by mouth 2  (two) times daily.     sennosides-docusate sodium 8.6-50 MG tablet  Commonly known as:  SENOKOT-S  Take 1 tablet by mouth every evening.     vancomycin 1-5 GM/200ML-% Soln  Commonly known as:  VANCOCIN  Inject 200 mLs (1,000 mg total) into the vein every Monday, Wednesday, and Friday with hemodialysis. STOP DATE 03/27/16           Follow-up Information    Follow up with [facility].   Why:  [facility] Management services. Nurse will call 24-72 hours after discharge. Call [phone] if you have not received a call within a week from dischacrge   Contact information:   [address] [place] [address]       Follow up with [facility].   Why:  HOME HEALTH SERVICES RN, PHYSICAL THERAPIST, OCCUPATIONAL THERAPIST, HOME HEALTH AIDE, SOCIAL WORKER   Contact information:   [address] [place] [address] [phone]      Allergies  Allergen Reactions  . Aspirin Other (See Comments)    Bleeding   . Lactose Intolerance (Gi) Diarrhea    Consultations:  Nephrology, [facility]   Procedures/Studies: Dg Chest 1 View  03/16/2016  CLINICAL DATA:  Altered mental status, possible seizure. History of end-stage renal disease on dialysis. EXAM: CHEST 1 VIEW COMPARISON:  Chest radiograph March 04, 2016 FINDINGS: Cardiomediastinal silhouette is unremarkable for this low inspiratory portable examination with crowded vasculature markings. Dual lead RIGHT cardiac pacemaker in situ. Tunneled dialysis catheter via RIGHT internal jugular venous approach with distal tip projecting in proximal RIGHT atrium. The lungs are clear without pleural effusions or focal consolidations. Trachea projects midline and there is no pneumothorax. Included soft tissue planes and osseous structures are non-suspicious. IMPRESSION: No acute cardiopulmonary process.  No change in life-support lines. Electronically Signed   By: Pernell Dupre  Bloomer M.D.    On: 03/16/2016 01:51   Ct Head Wo Contrast  03/19/2016  CLINICAL DATA:  Altered mental status EXAM: CT HEAD WITHOUT CONTRAST TECHNIQUE: Contiguous axial images were obtained from the base of the skull through the vertex without intravenous contrast. COMPARISON:  03/16/2016 FINDINGS: The examination is somewhat limited by patient motion artifact. The bony calvarium is intact. Atrophic changes are noted. Chronic white matter ischemic change is again seen. Bilateral basal ganglia calcifications are noted. No findings to suggest acute hemorrhage, acute infarction or space-occupying mass lesion are noted. IMPRESSION: Chronic atrophic and ischemic changes without acute intracranial abnormality. Electronically Signed   By: Alcide Clever M.D.   On: 03/19/2016 14:15   Ct Head Wo Contrast  03/16/2016  CLINICAL DATA:  Altered mental status with possible seizures. EXAM: CT HEAD WITHOUT CONTRAST TECHNIQUE: Contiguous axial images were obtained from the base of the skull through the vertex without intravenous contrast. COMPARISON:  03/04/2016 FINDINGS: Diffuse cerebral atrophy. Mild ventricular dilatation consistent with central atrophy. Low-attenuation changes in the deep white matter consistent with small vessel ischemia. No mass effect or midline shift. No abnormal extra-axial fluid collections. Gray-white matter junctions are distinct. Basal cisterns are not effaced. No evidence of acute intracranial hemorrhage. No depressed skull fractures. Mucosal thickening in the paranasal sinuses. Mastoid air cells are not opacified. Vascular calcifications. IMPRESSION: No acute intracranial abnormalities. Chronic atrophy and small vessel ischemic changes. Electronically Signed   By: Burman Nieves M.D.   On: 03/16/2016 02:30   Ct Head Wo Contrast  03/04/2016  CLINICAL DATA:  Altered mental status and fever. EXAM: CT HEAD WITHOUT CONTRAST TECHNIQUE: Contiguous axial images were obtained from the base of the skull through the  vertex without intravenous contrast. COMPARISON:  10/23/2015 FINDINGS: There is no intracranial hemorrhage, mass or evidence of acute infarction. There is moderately severe generalized atrophy. There is extensive chronic microvascular ischemic change. There is no significant extra-axial fluid collection. No acute intracranial findings are evident. The calvarium and skullbase are intact. The visible paranasal sinuses and orbits are unremarkable. IMPRESSION: Moderately severe generalized atrophy and chronic white matter hypodensities which likely represent small vessel ischemic disease. No acute intracranial findings. Electronically Signed   By: Ellery Plunk M.D.   On: 03/04/2016 06:53   Dg Chest Port 1 View  03/19/2016  CLINICAL DATA:  Atelectasis,hx htn,dm,esrd EXAM: PORTABLE CHEST 1 VIEW COMPARISON:  03/18/2016 FINDINGS: Mild improvement in the left basilar atelectasis. Lungs otherwise clear. No pneumothorax. Heart and mediastinum are unremarkable. Right internal jugular dual-lumen central venous catheter and right-sided sequential pacemaker are stable and well positioned. IMPRESSION: 1. No acute findings.  Improved left lung base atelectasis. Electronically Signed   By: Amie Portland M.D.   On: 03/19/2016 08:02   Dg Chest Port 1 View  03/18/2016  CLINICAL DATA:  Fever and altered mental status tonight. EXAM: PORTABLE CHEST 1 VIEW COMPARISON:  03/16/2016 FINDINGS: Cardiac pacemaker. Right central venous catheter with tip over the low SVC region. No pneumothorax. Shallow inspiration with atelectasis in the left lung base. No focal consolidation. No blunting of costophrenic angles. Normal heart size and pulmonary vascularity. Calcification of the aorta. Degenerative changes in the shoulders. IMPRESSION: Appliances appear in satisfactory location. Shallow inspiration with atelectasis in the left lung base. Electronically Signed   By: Burman Nieves M.D.   On: 03/18/2016 03:38   Dg Chest Port 1  View  03/04/2016  CLINICAL DATA:  Altered mental status. EXAM: PORTABLE CHEST 1 VIEW COMPARISON:  12/13/2015  FINDINGS: Intact transvenous cardiac leads. Right jugular dual-lumen central line extending into the low SVC. The lungs are clear. The pulmonary vasculature is normal. No large effusions. IMPRESSION: No active disease. Electronically Signed   By: Ellery Plunk M.D.   On: 03/04/2016 01:45      Subjective: Mr. Mccormac was much more interactive on discharge.  Family notes he is close to baseline. He notes that he is ready to go home.   Discharge Exam: Filed Vitals:   03/21/16 0618 03/21/16 0754  BP: 148/43 118/91  Pulse:  71  Temp: 98.8 F (37.1 C) 98 F (36.7 C)  Resp: 16 18   Filed Vitals:   03/20/16 1711 03/20/16 2058 03/21/16 0618 03/21/16 0754  BP: 157/56  148/43 118/91  Pulse: 69 84  71  Temp: 98.7 F (37.1 C)  98.8 F (37.1 C) 98 F (36.7 C)  TempSrc: Oral Oral Oral Oral  Resp: 17 18 16 18   Height:      Weight:  198 lb (89.812 kg)    SpO2: 94%  99% 100%   General exam: Appears calm and comfortable, Much more interactive, followed conversation but had some echolalia HENT: Neck supple, NCAT Respiratory system: Respiratory effort normal. No audible wheezing Cardiovascular system: S1 & S2 heard, RR, NR.  Gastrointestinal system: Abdomen is nondistended, soft and nontender. +BS Central nervous system: Alert and oriented to place, not to time or situation. No focal neurological deficits. GU: condom catheter in place Skin: No rashes, lesions or ulcers noted    The results of significant diagnostics from this hospitalization (including imaging, microbiology, ancillary and laboratory) are listed below for reference.     Microbiology: Recent Results (from the past 240 hour(s))  Blood culture (routine x 2)     Status: None   Collection Time: 03/16/16  2:00 AM  Result Value Ref Range Status   Specimen Description BLOOD RIGHT HAND  Final   Special Requests IN  PEDIATRIC BOTTLE  Final   Culture NO GROWTH 5 DAYS  Final   Report Status 03/21/2016 FINAL  Final  Blood culture (routine x 2)     Status: Abnormal   Collection Time: 03/16/16  2:14 AM  Result Value Ref Range Status   Specimen Description BLOOD LEFT HAND  Final   Special Requests BOTTLES DRAWN AEROBIC AND ANAEROBIC  Final   Culture  Setup Time   Final    GRAM POSITIVE COCCI IN CLUSTERS AEROBIC BOTTLE ONLY CRITICAL RESULT CALLED TO, READ BACK BY AND VERIFIED WITH: K. HAMMONS RPH AT 0733 03/18/16 BY D. VANHOOK    Culture (A)  Final    STAPHYLOCOCCUS SPECIES (COAGULASE NEGATIVE) THE SIGNIFICANCE OF ISOLATING THIS ORGANISM FROM A SINGLE SET OF BLOOD CULTURES WHEN MULTIPLE SETS ARE DRAWN IS UNCERTAIN. PLEASE NOTIFY THE MICROBIOLOGY DEPARTMENT WITHIN ONE WEEK IF SPECIATION AND SENSITIVITIES ARE REQUIRED.    Report Status 03/19/2016 FINAL  Final  Blood Culture ID Panel (Reflexed)     Status: Abnormal   Collection Time: 03/16/16  2:14 AM  Result Value Ref Range Status   Enterococcus species NOT DETECTED NOT DETECTED Final   Vancomycin resistance NOT DETECTED NOT DETECTED Final   Listeria monocytogenes NOT DETECTED NOT DETECTED Final   Staphylococcus species DETECTED (A) NOT DETECTED Final    Comment: CRITICAL RESULT CALLED TO, READ BACK BY AND VERIFIED WITH: K. HAMMONS RPH AT 1610 03/18/16 BY D. VANHOOK    Staphylococcus aureus NOT DETECTED NOT DETECTED Final   Methicillin resistance DETECTED (  A) NOT DETECTED Final    Comment: CRITICAL RESULT CALLED TO, READ BACK BY AND VERIFIED WITH: K. HAMMONS RPH AT 5597 03/18/16 BY D. VANHOOK    Streptococcus species NOT DETECTED NOT DETECTED Final   Streptococcus agalactiae NOT DETECTED NOT DETECTED Final   Streptococcus pneumoniae NOT DETECTED NOT DETECTED Final   Streptococcus pyogenes NOT DETECTED NOT DETECTED Final   Acinetobacter baumannii NOT DETECTED NOT DETECTED Final   Enterobacteriaceae species NOT DETECTED NOT DETECTED Final    Enterobacter cloacae complex NOT DETECTED NOT DETECTED Final   Escherichia coli NOT DETECTED NOT DETECTED Final   Klebsiella oxytoca NOT DETECTED NOT DETECTED Final   Klebsiella pneumoniae NOT DETECTED NOT DETECTED Final   Proteus species NOT DETECTED NOT DETECTED Final   Serratia marcescens NOT DETECTED NOT DETECTED Final   Carbapenem resistance NOT DETECTED NOT DETECTED Final   Haemophilus influenzae NOT DETECTED NOT DETECTED Final   Neisseria meningitidis NOT DETECTED NOT DETECTED Final   Pseudomonas aeruginosa NOT DETECTED NOT DETECTED Final   Candida albicans NOT DETECTED NOT DETECTED Final   Candida glabrata NOT DETECTED NOT DETECTED Final   Candida krusei NOT DETECTED NOT DETECTED Final   Candida parapsilosis NOT DETECTED NOT DETECTED Final   Candida tropicalis NOT DETECTED NOT DETECTED Final  Blood Culture (routine x 2)     Status: None   Collection Time: 03/18/16  2:35 AM  Result Value Ref Range Status   Specimen Description BLOOD RIGHT HAND  Final   Special Requests IN PEDIATRIC BOTTLE  Final   Culture NO GROWTH 5 DAYS  Final   Report Status 03/23/2016 FINAL  Final  Blood Culture (routine x 2)     Status: None   Collection Time: 03/18/16  3:08 AM  Result Value Ref Range Status   Specimen Description BLOOD LEFT ARM  Final   Special Requests BOTTLES DRAWN AEROBIC AND ANAEROBIC  Final   Culture NO GROWTH 5 DAYS  Final   Report Status 03/23/2016 FINAL  Final  Urine culture     Status: None   Collection Time: 03/18/16  3:50 AM  Result Value Ref Range Status   Specimen Description URINE, CATHETERIZED  Final   Special Requests NONE  Final   Culture NO GROWTH  Final   Report Status 03/19/2016 FINAL  Final     Labs: BNP (last 3 results) No results for input(s): BNP in the last 8760 hours. Basic Metabolic Panel:  Recent Labs Lab 03/18/16 0239 03/18/16 0841 03/19/16 0504 03/19/16 0755  NA 137  --  137 136  K 5.0  --  4.8 4.7  CL 101  --  101 100*  CO2 26  --   25 24  GLUCOSE 223*  --  148* 148*  BUN 39*  --  49* 50*  CREATININE 5.95*  --  7.29* 7.27*  CALCIUM 9.1  --  9.1 8.9  MG  --  2.0  --   --   PHOS  --  3.3  --  4.5   Liver Function Tests:  Recent Labs Lab 03/18/16 0239 03/19/16 0755  AST 50*  --   ALT 33  --   ALKPHOS 70  --   BILITOT 0.9  --   PROT 6.9  --   ALBUMIN 3.2* 2.6*   No results for input(s): LIPASE, AMYLASE in the last 168 hours. No results for input(s): AMMONIA in the last 168 hours. CBC:  Recent Labs Lab 03/18/16 0239 03/19/16 0504 03/20/16 0557  WBC 3.5* 3.4* 3.8*  NEUTROABS 2.4  --   --   HGB 12.9* 11.4* 12.0*  HCT 41.1 34.9* 37.6*  MCV 100.0 98.6 99.5  PLT 33* 51* 48*   Cardiac Enzymes: No results for input(s): CKTOTAL, CKMB, CKMBINDEX, TROPONINI in the last 168 hours. BNP: Invalid input(s): POCBNP CBG:  Recent Labs Lab 03/17/16 1148 03/18/16 0226 03/20/16 1755 03/21/16 0750 03/21/16 1132  GLUCAP 181* 203* 166* 128* 160*   D-Dimer No results for input(s): DDIMER in the last 72 hours. Hgb A1c No results for input(s): HGBA1C in the last 72 hours. Lipid Profile No results for input(s): CHOL, HDL, LDLCALC, TRIG, CHOLHDL, LDLDIRECT in the last 72 hours. Thyroid function studies No results for input(s): TSH, T4TOTAL, T3FREE, THYROIDAB in the last 72 hours.  Invalid input(s): FREET3 Anemia work up No results for input(s): VITAMINB12, FOLATE, FERRITIN, TIBC, IRON, RETICCTPCT in the last 72 hours. Urinalysis    Component Value Date/Time   COLORURINE YELLOW 03/18/2016 0350   APPEARANCEUR CLEAR 03/18/2016 0350   LABSPEC 1.014 03/18/2016 0350   PHURINE 7.5 03/18/2016 0350   GLUCOSEU 500* 03/18/2016 0350   HGBUR TRACE* 03/18/2016 0350   BILIRUBINUR NEGATIVE 03/18/2016 0350   KETONESUR NEGATIVE 03/18/2016 0350   PROTEINUR 100* 03/18/2016 0350   UROBILINOGEN 0.2 06/20/2015 0845   NITRITE NEGATIVE 03/18/2016 0350   LEUKOCYTESUR NEGATIVE 03/18/2016 0350   Sepsis Labs Invalid input(s):  PROCALCITONIN,  WBC,  LACTICIDVEN Microbiology Recent Results (from the past 240 hour(s))  Blood culture (routine x 2)     Status: None   Collection Time: 03/16/16  2:00 AM  Result Value Ref Range Status   Specimen Description BLOOD RIGHT HAND  Final   Special Requests IN PEDIATRIC BOTTLE  Final   Culture NO GROWTH 5 DAYS  Final   Report Status 03/21/2016 FINAL  Final  Blood culture (routine x 2)     Status: Abnormal   Collection Time: 03/16/16  2:14 AM  Result Value Ref Range Status   Specimen Description BLOOD LEFT HAND  Final   Special Requests BOTTLES DRAWN AEROBIC AND ANAEROBIC  Final   Culture  Setup Time   Final    GRAM POSITIVE COCCI IN CLUSTERS AEROBIC BOTTLE ONLY CRITICAL RESULT CALLED TO, READ BACK BY AND VERIFIED WITH: K. HAMMONS RPH AT 0733 03/18/16 BY D. VANHOOK    Culture (A)  Final    STAPHYLOCOCCUS SPECIES (COAGULASE NEGATIVE) THE SIGNIFICANCE OF ISOLATING THIS ORGANISM FROM A SINGLE SET OF BLOOD CULTURES WHEN MULTIPLE SETS ARE DRAWN IS UNCERTAIN. PLEASE NOTIFY THE MICROBIOLOGY DEPARTMENT WITHIN ONE WEEK IF SPECIATION AND SENSITIVITIES ARE REQUIRED.    Report Status 03/19/2016 FINAL  Final  Blood Culture ID Panel (Reflexed)     Status: Abnormal   Collection Time: 03/16/16  2:14 AM  Result Value Ref Range Status   Enterococcus species NOT DETECTED NOT DETECTED Final   Vancomycin resistance NOT DETECTED NOT DETECTED Final   Listeria monocytogenes NOT DETECTED NOT DETECTED Final   Staphylococcus species DETECTED (A) NOT DETECTED Final    Comment: CRITICAL RESULT CALLED TO, READ BACK BY AND VERIFIED WITH: K. HAMMONS RPH AT 0733 03/18/16 BY D. VANHOOK    Staphylococcus aureus NOT DETECTED NOT DETECTED Final   Methicillin resistance DETECTED (A) NOT DETECTED Final    Comment: CRITICAL RESULT CALLED TO, READ BACK BY AND VERIFIED WITH: K. HAMMONS RPH AT 1610 03/18/16 BY D. VANHOOK    Streptococcus species NOT DETECTED NOT DETECTED Final   Streptococcus  agalactiae NOT DETECTED NOT DETECTED Final   Streptococcus pneumoniae NOT DETECTED NOT DETECTED Final   Streptococcus pyogenes NOT DETECTED NOT DETECTED Final   Acinetobacter baumannii NOT DETECTED NOT DETECTED Final   Enterobacteriaceae species NOT DETECTED NOT DETECTED Final   Enterobacter cloacae complex NOT DETECTED NOT DETECTED Final   Escherichia coli NOT DETECTED NOT DETECTED Final   Klebsiella oxytoca NOT DETECTED NOT DETECTED Final   Klebsiella pneumoniae NOT DETECTED NOT DETECTED Final   Proteus species NOT DETECTED NOT DETECTED Final   Serratia marcescens NOT DETECTED NOT DETECTED Final   Carbapenem resistance NOT DETECTED NOT DETECTED Final   Haemophilus influenzae NOT DETECTED NOT DETECTED Final   Neisseria meningitidis NOT DETECTED NOT DETECTED Final   Pseudomonas aeruginosa NOT DETECTED NOT DETECTED Final   Candida albicans NOT DETECTED NOT DETECTED Final   Candida glabrata NOT DETECTED NOT DETECTED Final   Candida krusei NOT DETECTED NOT DETECTED Final   Candida parapsilosis NOT DETECTED NOT DETECTED Final   Candida tropicalis NOT DETECTED NOT DETECTED Final  Blood Culture (routine x 2)     Status: None   Collection Time: 03/18/16  2:35 AM  Result Value Ref Range Status   Specimen Description BLOOD RIGHT HAND  Final   Special Requests IN PEDIATRIC BOTTLE  Final   Culture NO GROWTH 5 DAYS  Final   Report Status 03/23/2016 FINAL  Final  Blood Culture (routine x 2)     Status: None   Collection Time: 03/18/16  3:08 AM  Result Value Ref Range Status   Specimen Description BLOOD LEFT ARM  Final   Special Requests BOTTLES DRAWN AEROBIC AND ANAEROBIC  Final   Culture NO GROWTH 5 DAYS  Final   Report Status 03/23/2016 FINAL  Final  Urine culture     Status: None   Collection Time: 03/18/16  3:50 AM  Result Value Ref Range Status   Specimen Description URINE, CATHETERIZED  Final   Special Requests NONE  Final   Culture NO GROWTH  Final   Report Status 03/19/2016  FINAL  Final     Time coordinating discharge: Over 30 minutes  SIGNED:   Debe Coder, MD  [facility] 03/24/2016, 8:09 AM Pager [phone]  If 7PM-7AM, please contact night-coverage [email or web address] Password [email or web address]

## 2016-03-23 ENCOUNTER — Other Ambulatory Visit: Payer: Self-pay | Admitting: *Deleted

## 2016-03-23 DIAGNOSIS — E1122 Type 2 diabetes mellitus with diabetic chronic kidney disease: Secondary | ICD-10-CM | POA: Diagnosis not present

## 2016-03-23 DIAGNOSIS — M1991 Primary osteoarthritis, unspecified site: Secondary | ICD-10-CM | POA: Diagnosis not present

## 2016-03-23 DIAGNOSIS — G9340 Encephalopathy, unspecified: Secondary | ICD-10-CM | POA: Diagnosis not present

## 2016-03-23 DIAGNOSIS — Z794 Long term (current) use of insulin: Secondary | ICD-10-CM | POA: Diagnosis not present

## 2016-03-23 DIAGNOSIS — I110 Hypertensive heart disease with heart failure: Secondary | ICD-10-CM | POA: Diagnosis not present

## 2016-03-23 DIAGNOSIS — M5136 Other intervertebral disc degeneration, lumbar region: Secondary | ICD-10-CM | POA: Diagnosis not present

## 2016-03-23 DIAGNOSIS — I5032 Chronic diastolic (congestive) heart failure: Secondary | ICD-10-CM | POA: Diagnosis not present

## 2016-03-23 DIAGNOSIS — N186 End stage renal disease: Secondary | ICD-10-CM | POA: Diagnosis not present

## 2016-03-23 DIAGNOSIS — I272 Other secondary pulmonary hypertension: Secondary | ICD-10-CM | POA: Diagnosis not present

## 2016-03-23 DIAGNOSIS — Z79899 Other long term (current) drug therapy: Secondary | ICD-10-CM | POA: Diagnosis not present

## 2016-03-23 DIAGNOSIS — S065X0S Traumatic subdural hemorrhage without loss of consciousness, sequela: Secondary | ICD-10-CM | POA: Diagnosis not present

## 2016-03-23 LAB — CULTURE, BLOOD (ROUTINE X 2)
CULTURE: NO GROWTH
Culture: NO GROWTH

## 2016-03-23 NOTE — Patient Outreach (Signed)
Referral received from care management assistant via inpatient case manager, Beecher Mcardle.  Member was admitted to hospital on 7/13 for fever and altered mental status, discharged 7/16.  According to chart, he also has history of hypertension, congestive heart failure, diabetes (A1C 5.3), dementia, and end state renal disease with dialysis on Monday/Wednesday/Friday.  Call placed to listed preferred number, which is his son's number, [name].  No answer, HIPAA compliant voice message left.  Will await call back, will make another attempt to contact tomorrow.  [name], [place], MSN [facility] Care Management  [facility] Manager [phone]

## 2016-03-24 DIAGNOSIS — N186 End stage renal disease: Secondary | ICD-10-CM | POA: Diagnosis not present

## 2016-03-24 DIAGNOSIS — Z452 Encounter for adjustment and management of vascular access device: Secondary | ICD-10-CM | POA: Diagnosis not present

## 2016-03-24 DIAGNOSIS — E1129 Type 2 diabetes mellitus with other diabetic kidney complication: Secondary | ICD-10-CM | POA: Diagnosis not present

## 2016-03-24 DIAGNOSIS — A4902 Methicillin resistant Staphylococcus aureus infection, unspecified site: Secondary | ICD-10-CM | POA: Diagnosis not present

## 2016-03-24 DIAGNOSIS — D631 Anemia in chronic kidney disease: Secondary | ICD-10-CM | POA: Diagnosis not present

## 2016-03-24 DIAGNOSIS — D689 Coagulation defect, unspecified: Secondary | ICD-10-CM | POA: Diagnosis not present

## 2016-03-24 DIAGNOSIS — D509 Iron deficiency anemia, unspecified: Secondary | ICD-10-CM | POA: Diagnosis not present

## 2016-03-25 ENCOUNTER — Other Ambulatory Visit: Payer: Self-pay | Admitting: *Deleted

## 2016-03-25 ENCOUNTER — Encounter (HOSPITAL_COMMUNITY): Payer: Self-pay | Admitting: Vascular Surgery

## 2016-03-25 ENCOUNTER — Telehealth: Payer: Self-pay | Admitting: Vascular Surgery

## 2016-03-25 ENCOUNTER — Encounter (HOSPITAL_COMMUNITY)
Admission: RE | Disposition: A | Discharge status: Home / Self Care | Payer: Self-pay | Source: Ambulatory Visit | Attending: Vascular Surgery

## 2016-03-25 ENCOUNTER — Ambulatory Visit (HOSPITAL_COMMUNITY)
Admission: RE | Admit: 2016-03-25 | Discharge: 2016-03-25 | Disposition: A | Discharge status: Home / Self Care | Payer: Medicare Other | Source: Ambulatory Visit | Attending: Vascular Surgery | Admitting: Vascular Surgery

## 2016-03-25 DIAGNOSIS — I272 Other secondary pulmonary hypertension: Secondary | ICD-10-CM | POA: Diagnosis not present

## 2016-03-25 DIAGNOSIS — Z950 Presence of cardiac pacemaker: Secondary | ICD-10-CM | POA: Insufficient documentation

## 2016-03-25 DIAGNOSIS — I110 Hypertensive heart disease with heart failure: Secondary | ICD-10-CM | POA: Diagnosis not present

## 2016-03-25 DIAGNOSIS — N185 Chronic kidney disease, stage 5: Secondary | ICD-10-CM | POA: Diagnosis not present

## 2016-03-25 DIAGNOSIS — Z9181 History of falling: Secondary | ICD-10-CM | POA: Diagnosis not present

## 2016-03-25 DIAGNOSIS — N186 End stage renal disease: Secondary | ICD-10-CM | POA: Diagnosis not present

## 2016-03-25 DIAGNOSIS — M1990 Unspecified osteoarthritis, unspecified site: Secondary | ICD-10-CM | POA: Diagnosis not present

## 2016-03-25 DIAGNOSIS — I82B11 Acute embolism and thrombosis of right subclavian vein: Secondary | ICD-10-CM | POA: Insufficient documentation

## 2016-03-25 DIAGNOSIS — Z794 Long term (current) use of insulin: Secondary | ICD-10-CM | POA: Insufficient documentation

## 2016-03-25 DIAGNOSIS — F0391 Unspecified dementia with behavioral disturbance: Secondary | ICD-10-CM

## 2016-03-25 DIAGNOSIS — G4730 Sleep apnea, unspecified: Secondary | ICD-10-CM | POA: Diagnosis not present

## 2016-03-25 DIAGNOSIS — E1122 Type 2 diabetes mellitus with diabetic chronic kidney disease: Secondary | ICD-10-CM | POA: Diagnosis not present

## 2016-03-25 DIAGNOSIS — G9340 Encephalopathy, unspecified: Secondary | ICD-10-CM | POA: Diagnosis not present

## 2016-03-25 DIAGNOSIS — Z79899 Other long term (current) drug therapy: Secondary | ICD-10-CM | POA: Diagnosis not present

## 2016-03-25 DIAGNOSIS — E1169 Type 2 diabetes mellitus with other specified complication: Secondary | ICD-10-CM

## 2016-03-25 DIAGNOSIS — I120 Hypertensive chronic kidney disease with stage 5 chronic kidney disease or end stage renal disease: Secondary | ICD-10-CM | POA: Insufficient documentation

## 2016-03-25 DIAGNOSIS — R569 Unspecified convulsions: Secondary | ICD-10-CM | POA: Insufficient documentation

## 2016-03-25 DIAGNOSIS — M5136 Other intervertebral disc degeneration, lumbar region: Secondary | ICD-10-CM | POA: Diagnosis not present

## 2016-03-25 DIAGNOSIS — I10 Essential (primary) hypertension: Secondary | ICD-10-CM

## 2016-03-25 DIAGNOSIS — M1991 Primary osteoarthritis, unspecified site: Secondary | ICD-10-CM | POA: Diagnosis not present

## 2016-03-25 DIAGNOSIS — Z992 Dependence on renal dialysis: Secondary | ICD-10-CM | POA: Diagnosis not present

## 2016-03-25 DIAGNOSIS — I5032 Chronic diastolic (congestive) heart failure: Secondary | ICD-10-CM | POA: Diagnosis not present

## 2016-03-25 DIAGNOSIS — S065X0S Traumatic subdural hemorrhage without loss of consciousness, sequela: Secondary | ICD-10-CM | POA: Diagnosis not present

## 2016-03-25 HISTORY — PX: PERIPHERAL VASCULAR CATHETERIZATION: SHX172C

## 2016-03-25 LAB — POCT I-STAT, CHEM 8
BUN: 46 mg/dL — ABNORMAL HIGH (ref 6–20)
CALCIUM ION: 1.18 mmol/L (ref 1.12–1.23)
CHLORIDE: 98 mmol/L — AB (ref 101–111)
Creatinine, Ser: 5.8 mg/dL — ABNORMAL HIGH (ref 0.61–1.24)
Glucose, Bld: 167 mg/dL — ABNORMAL HIGH (ref 65–99)
HCT: 35 % — ABNORMAL LOW (ref 39.0–52.0)
Hemoglobin: 11.9 g/dL — ABNORMAL LOW (ref 13.0–17.0)
Potassium: 4.4 mmol/L (ref 3.5–5.1)
SODIUM: 138 mmol/L (ref 135–145)
TCO2: 31 mmol/L (ref 0–100)

## 2016-03-25 SURGERY — A/V SHUNTOGRAM/FISTULAGRAM
Anesthesia: LOCAL

## 2016-03-25 MED ORDER — SODIUM CHLORIDE 0.9 % IV SOLN: 250.0000 mL | INTRAVENOUS | Status: DC | PRN

## 2016-03-25 MED ORDER — HEPARIN (PORCINE) IN NACL 2-0.9 UNIT/ML-% IJ SOLN
INTRAMUSCULAR | Status: AC
  Filled 2016-03-25: qty 500

## 2016-03-25 MED ORDER — SODIUM CHLORIDE 0.9% FLUSH: 3.0000 mL | INTRAVENOUS | Status: DC | PRN

## 2016-03-25 MED ORDER — ACETAMINOPHEN 325 MG PO TABS: 650.0000 mg | ORAL_TABLET | ORAL | Status: DC | PRN

## 2016-03-25 MED ORDER — SODIUM CHLORIDE 0.9% FLUSH: 3.0000 mL | Freq: Two times a day (BID) | INTRAVENOUS | Status: DC

## 2016-03-25 MED ORDER — IODIXANOL 320 MG/ML IV SOLN
INTRAVENOUS | Status: DC | PRN
  Administered 2016-03-25: 50 mL via INTRAVENOUS

## 2016-03-25 SURGICAL SUPPLY — 2 items
STOPCOCK MORSE 400PSI 3WAY (MISCELLANEOUS) ×1 IMPLANT
TUBING CIL FLEX 10 FLL-RA (TUBING) ×1 IMPLANT

## 2016-03-25 NOTE — Discharge Instructions (Signed)
Venogram, Care After °Refer to this sheet in the next few weeks. These instructions provide you with information on caring for yourself after your procedure. Your health care provider may also give you more specific instructions. Your treatment has been planned according to current medical practices, but problems sometimes occur. Call your health care provider if you have any problems or questions after your procedure. °WHAT TO EXPECT AFTER THE PROCEDURE °After your procedure, it is typical to have the following sensations: °· Mild discomfort at the catheter insertion site. °HOME CARE INSTRUCTIONS  °· Take all medicines exactly as directed. °· Follow any prescribed diet. °· Follow instructions regarding both rest and physical activity. °· Drink more fluids for the first several days after the procedure in order to help flush dye from your kidneys. °SEEK MEDICAL CARE IF: °· You develop a rash. °· You have fever not controlled by medicine. °SEEK IMMEDIATE MEDICAL CARE IF: °· There is pain, drainage, bleeding, redness, swelling, warmth or a red streak at the site of the IV tube. °· The extremity where your IV tube was placed becomes discolored, numb, or cool. °· You have difficulty breathing or shortness of breath. °· You develop chest pain. °· You have excessive dizziness or fainting. °  °This information is not intended to replace advice given to you by your health care provider. Make sure you discuss any questions you have with your health care provider. °  °Document Released: 06/13/2013 Document Revised: 08/28/2013 Document Reviewed: 06/13/2013 °Elsevier Interactive Patient Education ©2016 Elsevier Inc. ° °

## 2016-03-25 NOTE — H&P (Signed)
Brief History and Physical  History of Present Illness  [name] is a 82 y.o. male who presents with chief complaint: ESRD.  The patient presents today for R arm and central venogram to determine access options.  His L side was found to be centrally occluded..    Past Medical History  Diagnosis Date  . Hypertension   . Diabetes mellitus without complication (HCC)   . Arthritis   . History of blood transfusion   . GI bleed   . Seizure (HCC)   . Subdural hematoma (HCC) 05/2015    from fall  . Sleep apnea     wears CPAP n/c  . Pulmonary hypertension (HCC)     severe pulmonary HTN by 01/14/13 echo  . ESRD (end stage renal disease) on dialysis [facility])     "MWF" (03/04/2016)    Past Surgical History  Procedure Laterality Date  . Esophagogastroduodenoscopy  10/03/2012    Procedure: ESOPHAGOGASTRODUODENOSCOPY (EGD);  Surgeon: Theda Belfast, MD;  Location: [facility] ENDOSCOPY;  Service: Endoscopy;  Laterality: N/A;  . Colonoscopy  10/04/2012    Procedure: COLONOSCOPY;  Surgeon: Theda Belfast, MD;  Location: [facility] ENDOSCOPY;  Service: Endoscopy;  Laterality: N/A;  . Colonoscopy with esophagogastroduodenoscopy (egd) Left 11/30/2012    Procedure: COLONOSCOPY WITH ESOPHAGOGASTRODUODENOSCOPY (EGD);  Surgeon: Theda Belfast, MD;  Location: [facility] ENDOSCOPY;  Service: Endoscopy;  Laterality: Left;  . Spine surgery  2004    back fusion/ [facility] Penumalli,MD  . Retinal laser procedure Right 04/2015  . Av fistula placement Left 09/09/2015    Procedure: INSERTION OF ARTERIOVENOUS  GORE-TEX GRAFT Left  ARM;  Surgeon: Sherren Kerns, MD;  Location: [facility] OR;  Service: Vascular;  Laterality: Left;  . Insertion of dialysis catheter Right 11/27/2015    Procedure: INSERTION OF DIALYSIS CATHETER - Right Internal Jugular Placement;  Surgeon: Chuck Hint, MD;  Location: [facility] OR;  Service: Vascular;  Laterality: Right;  . Ep implantable device N/A 12/12/2015    Procedure: Pacemaker Implant;  Surgeon: Will Jorja Loa, MD;  Location: [facility] INVASIVE CV LAB;  Service: Cardiovascular;  Laterality: N/A;  . Peripheral vascular catheterization Left 01/27/2016    Procedure: Fistulagram;  Surgeon: Nada Libman, MD;  Location: [facility] INVASIVE CV LAB;  Service: Cardiovascular;  Laterality: Left;  upper arm  . Ligation arteriovenous gortex graft Left 01/27/2016    Procedure: LEFT LIGATION ARTERIOVENOUS GORTEX GRAFT;  Surgeon: Chuck Hint, MD;  Location: [facility] OR;  Service: Vascular;  Laterality: Left;    Social History   Social History  . Marital Status: Married    Spouse Name: patricia  . Number of Children: 2  . Years of Education: PHD   Occupational History  . Retired    Social History Main Topics  . Smoking status: Never Smoker   . Smokeless tobacco: Never Used  . Alcohol Use: No  . Drug Use: No  . Sexual Activity: No   Other Topics Concern  . Not on file   Social History Narrative   Patient currently in [facility] rehab.   Caffeine Use: 1-2 cups occasionally    Family History  Problem Relation Age of Onset  . Sudden death Mother   . Sudden death Father     Current Facility-Administered Medications on File Prior to Encounter  Medication Dose Route Frequency Provider Last Rate Last Dose  . Chlorhexidine Gluconate Cloth 2 % PADS 6 each  6 each Topical Once Sherren Kerns,  MD       Current Outpatient Prescriptions on File Prior to Encounter  Medication Sig Dispense Refill  . amLODipine (NORVASC) 5 MG tablet Take 1 tablet (5 mg total) by mouth at bedtime. 30 tablet 5  . calcitRIOL (ROCALTROL) 0.5 MCG capsule Take 2 capsules (1 mcg total) by mouth every Monday, Wednesday, and Friday with hemodialysis. 24 capsule 5  . calcium acetate (PHOSLO) 667 MG capsule Take 3 capsules by mouth three times a day 180 capsule 5  . fexofenadine (ALLEGRA) 180 MG tablet Take 1 tablet (180 mg total) by mouth daily as needed for allergies or rhinitis. 30 tablet 0  . folic acid (FOLVITE)  1 MG tablet Take 1 tablet (1 mg total) by mouth daily. 30 tablet 5  . gabapentin (NEURONTIN) 100 MG capsule Take 1 capsule (100 mg total) by mouth 3 (three) times daily. 90 capsule 5  . glucose blood (ACCU-CHEK AVIVA PLUS) test strip 1 each by Other route 3 (three) times daily. Use as instructed 100 each 3  . HUMALOG KWIKPEN 100 UNIT/ML KiwkPen Inject 5 Units into the skin See admin instructions. Per sliding scale  0  . Insulin Pen Needle 31G X 8 MM MISC Use as directed with insulin pens 100 each 5  . multivitamin (RENA-VIT) TABS tablet Take 1 tablet by mouth at bedtime. 30 tablet 5  . pantoprazole (PROTONIX) 40 MG tablet Take 1 tablet (40 mg total) by mouth 2 (two) times daily. 60 tablet 5  . sennosides-docusate sodium (SENOKOT-S) 8.6-50 MG tablet Take 1 tablet by mouth every evening. 30 tablet 5    Allergies  Allergen Reactions  . Aspirin Other (See Comments)    Bleeding   . Lactose Intolerance (Gi) Diarrhea    Review of Systems: As listed above, otherwise negative.  Physical Examination  Filed Vitals:   03/25/16 0556  BP: 141/68  Pulse: 84  Temp: 99.1 F (37.3 C)  TempSrc: Oral  Resp: 18  Height: 6' (1.829 m)  Weight: 198 lb (89.812 kg)  SpO2: 91%    General: A&O x 3, WDWN  Pulmonary: Sym exp, good air movt, CTAB, no rales, rhonchi, & wheezing  Cardiac: RRR, Nl S1, S2, no Murmurs, rubs or gallops  Gastrointestinal: soft, NTND, -G/R, - HSM, - masses, - CVAT B  Musculoskeletal: M/S 5/5 throughout , Extremities without ischemic changes   Laboratory See iStat  Medical Decision Making  [name] is a 82 y.o. male who presents with: ESRD.   The patient is scheduled for: R arm and central venogram I discussed with the patient the nature of angiographic procedures, especially the limited patencies of any endovascular intervention.  The patient is aware of that the risks of an angiographic procedure include but are not limited to: bleeding, infection, access site  complications, renal failure, embolization, rupture of vessel, dissection, possible need for emergent surgical intervention, possible need for surgical procedures to treat the patient's pathology, and stroke and death.    The patient is aware of the risks and agrees to proceed.  Leonides Sake, MD [facility] [place] Office: [phone] Pager: [phone]  03/25/2016, 7:05 AM

## 2016-03-25 NOTE — Telephone Encounter (Signed)
-----   Message from Sharee Pimple, RN sent at 03/25/2016 11:06 AM EDT ----- Regarding: schedule 2-4 weeks    ----- Message -----    From: Fransisco Hertz, MD    Sent: 03/25/2016   7:47 AM      To: [facility] Charge [address]  Rameen Quinney [id number] [birth year]  Procedure: R arm and central venogram  Follow-up: 2-4 weeks with Dr. Darrick Penna

## 2016-03-25 NOTE — Patient Outreach (Signed)
Second attempt made to contact son for transition of care, this time successful.  He state that the member has been doing "fairly well," stating that the member's memory has been "a little better" over the past couple days.  He reports that he and his wife are the member's and the member's wife primary caregivers, and they live 4 houses down from them.  He state that they provide all meals, self-care management (blood sugar/blood pressure checks), and medication management (member's daughter in law is a Associate Professor).  He state that they are considering moving in with the member/wife, but in the interim are looking to have more supervision with personal care assistance.  They are in the process of applying for Medicaid, will refer to social worker for assistance.  Son state that the member is now receiving home health with nursing, occupational therapy, and physical therapy.  He state that there was a hospice/palliative care referral for the member during a hospitalization last year and they declined the program.  He reports that they may be at a point now where they will consider it.  He state that the member has been having problems with his port, and they are trying to make the decision if he should continue with dialysis.  Discussed [facility] with son, he state he is willing to hear more information for consideration.  Will place referral.  Son and daughter in law deny any further concerns at this time, provided with contact information, advised to contact with questions.  Will follow up next week.  [name], [place], MSN [facility] Care Management  [facility] Manager [phone]

## 2016-03-25 NOTE — Telephone Encounter (Signed)
Sched appt 8/17 at 3:45. Spoke to pt's son to inform them of appt.

## 2016-03-25 NOTE — Op Note (Signed)
    OPERATIVE NOTE   PROCEDURE: 1. right arm and central venogram   PRE-OPERATIVE DIAGNOSIS: end stage renal disease  POST-OPERATIVE DIAGNOSIS: same as above   SURGEON: Leonides Sake, MD  ANESTHESIA: local  ESTIMATED BLOOD LOSS: 5 cc  FINDING(S): 1. Pacemaker in right chest with wires running in right subclavian vein   2. Right internal jugular vein tunneled dialysis catheter  3. Occluded right subclavian vein   4. Collaterals fill proximal right innominate vein and superior vena cava  5. <50% stenosis in superior vena cava   SPECIMEN(S):  None  CONTRAST: 25 cc  INDICATIONS: [name] is a 82 y.o. male who presents with end stage renal disease.  The patient is scheduled for right venogram to help determine the availability of proximal veins for permanent access placement.  The patient is aware the risks include but are not limited to: bleeding, infection, thrombosis of the cannulated access, and possible anaphylactic reaction to the contrast.  The patient is aware of the risks of the procedure and elects to proceed forward.  DESCRIPTION: After full informed written consent was obtained, the patient was brought back to the angiography suite and placed supine upon the angiography table.  The patient was connected to monitoring equipment.  The right forearm IV was connected to IV extension tubing.  Hand injections were completed to image the arm veins and central venous structures, the findings of which are listed above.    Based on the images, the patient might be a candidate for a HeRO graft-catheter.   COMPLICATIONS: none  CONDITION: stable   Leonides Sake, MD [facility] [place] Office: [phone] Pager: [phone]  03/25/2016 7:42 AM

## 2016-03-26 DIAGNOSIS — E1129 Type 2 diabetes mellitus with other diabetic kidney complication: Secondary | ICD-10-CM | POA: Diagnosis not present

## 2016-03-26 DIAGNOSIS — D689 Coagulation defect, unspecified: Secondary | ICD-10-CM | POA: Diagnosis not present

## 2016-03-26 DIAGNOSIS — D509 Iron deficiency anemia, unspecified: Secondary | ICD-10-CM | POA: Diagnosis not present

## 2016-03-26 DIAGNOSIS — Z452 Encounter for adjustment and management of vascular access device: Secondary | ICD-10-CM | POA: Diagnosis not present

## 2016-03-26 DIAGNOSIS — A4902 Methicillin resistant Staphylococcus aureus infection, unspecified site: Secondary | ICD-10-CM | POA: Diagnosis not present

## 2016-03-26 DIAGNOSIS — D631 Anemia in chronic kidney disease: Secondary | ICD-10-CM | POA: Diagnosis not present

## 2016-03-26 DIAGNOSIS — N186 End stage renal disease: Secondary | ICD-10-CM | POA: Diagnosis not present

## 2016-03-26 MED FILL — Heparin Sodium (Porcine) 2 Unit/ML in Sodium Chloride 0.9%: INTRAMUSCULAR | Qty: 500 | Status: AC

## 2016-03-29 ENCOUNTER — Encounter: Payer: Self-pay | Admitting: *Deleted

## 2016-03-29 DIAGNOSIS — A4902 Methicillin resistant Staphylococcus aureus infection, unspecified site: Secondary | ICD-10-CM | POA: Diagnosis not present

## 2016-03-29 DIAGNOSIS — Z794 Long term (current) use of insulin: Secondary | ICD-10-CM | POA: Diagnosis not present

## 2016-03-29 DIAGNOSIS — I5032 Chronic diastolic (congestive) heart failure: Secondary | ICD-10-CM | POA: Diagnosis not present

## 2016-03-29 DIAGNOSIS — D509 Iron deficiency anemia, unspecified: Secondary | ICD-10-CM | POA: Diagnosis not present

## 2016-03-29 DIAGNOSIS — M5136 Other intervertebral disc degeneration, lumbar region: Secondary | ICD-10-CM | POA: Diagnosis not present

## 2016-03-29 DIAGNOSIS — D631 Anemia in chronic kidney disease: Secondary | ICD-10-CM | POA: Diagnosis not present

## 2016-03-29 DIAGNOSIS — I110 Hypertensive heart disease with heart failure: Secondary | ICD-10-CM | POA: Diagnosis not present

## 2016-03-29 DIAGNOSIS — M1991 Primary osteoarthritis, unspecified site: Secondary | ICD-10-CM | POA: Diagnosis not present

## 2016-03-29 DIAGNOSIS — E1129 Type 2 diabetes mellitus with other diabetic kidney complication: Secondary | ICD-10-CM | POA: Diagnosis not present

## 2016-03-29 DIAGNOSIS — S065X0S Traumatic subdural hemorrhage without loss of consciousness, sequela: Secondary | ICD-10-CM | POA: Diagnosis not present

## 2016-03-29 DIAGNOSIS — G9340 Encephalopathy, unspecified: Secondary | ICD-10-CM | POA: Diagnosis not present

## 2016-03-29 DIAGNOSIS — N186 End stage renal disease: Secondary | ICD-10-CM | POA: Diagnosis not present

## 2016-03-29 DIAGNOSIS — Z79899 Other long term (current) drug therapy: Secondary | ICD-10-CM | POA: Diagnosis not present

## 2016-03-29 DIAGNOSIS — D689 Coagulation defect, unspecified: Secondary | ICD-10-CM | POA: Diagnosis not present

## 2016-03-29 DIAGNOSIS — I272 Other secondary pulmonary hypertension: Secondary | ICD-10-CM | POA: Diagnosis not present

## 2016-03-29 DIAGNOSIS — Z452 Encounter for adjustment and management of vascular access device: Secondary | ICD-10-CM | POA: Diagnosis not present

## 2016-03-29 DIAGNOSIS — E1122 Type 2 diabetes mellitus with diabetic chronic kidney disease: Secondary | ICD-10-CM | POA: Diagnosis not present

## 2016-03-30 DIAGNOSIS — N186 End stage renal disease: Secondary | ICD-10-CM | POA: Diagnosis not present

## 2016-03-30 DIAGNOSIS — I5032 Chronic diastolic (congestive) heart failure: Secondary | ICD-10-CM | POA: Diagnosis not present

## 2016-03-30 DIAGNOSIS — M5136 Other intervertebral disc degeneration, lumbar region: Secondary | ICD-10-CM | POA: Diagnosis not present

## 2016-03-30 DIAGNOSIS — M1991 Primary osteoarthritis, unspecified site: Secondary | ICD-10-CM | POA: Diagnosis not present

## 2016-03-30 DIAGNOSIS — S065X0S Traumatic subdural hemorrhage without loss of consciousness, sequela: Secondary | ICD-10-CM | POA: Diagnosis not present

## 2016-03-30 DIAGNOSIS — G9340 Encephalopathy, unspecified: Secondary | ICD-10-CM | POA: Diagnosis not present

## 2016-03-30 DIAGNOSIS — I272 Other secondary pulmonary hypertension: Secondary | ICD-10-CM | POA: Diagnosis not present

## 2016-03-30 DIAGNOSIS — Z794 Long term (current) use of insulin: Secondary | ICD-10-CM | POA: Diagnosis not present

## 2016-03-30 DIAGNOSIS — I110 Hypertensive heart disease with heart failure: Secondary | ICD-10-CM | POA: Diagnosis not present

## 2016-03-30 DIAGNOSIS — E1122 Type 2 diabetes mellitus with diabetic chronic kidney disease: Secondary | ICD-10-CM | POA: Diagnosis not present

## 2016-03-30 DIAGNOSIS — Z79899 Other long term (current) drug therapy: Secondary | ICD-10-CM | POA: Diagnosis not present

## 2016-03-31 DIAGNOSIS — E1129 Type 2 diabetes mellitus with other diabetic kidney complication: Secondary | ICD-10-CM | POA: Diagnosis not present

## 2016-03-31 DIAGNOSIS — D631 Anemia in chronic kidney disease: Secondary | ICD-10-CM | POA: Diagnosis not present

## 2016-03-31 DIAGNOSIS — Z452 Encounter for adjustment and management of vascular access device: Secondary | ICD-10-CM | POA: Diagnosis not present

## 2016-03-31 DIAGNOSIS — D509 Iron deficiency anemia, unspecified: Secondary | ICD-10-CM | POA: Diagnosis not present

## 2016-03-31 DIAGNOSIS — N186 End stage renal disease: Secondary | ICD-10-CM | POA: Diagnosis not present

## 2016-03-31 DIAGNOSIS — A4902 Methicillin resistant Staphylococcus aureus infection, unspecified site: Secondary | ICD-10-CM | POA: Diagnosis not present

## 2016-03-31 DIAGNOSIS — D689 Coagulation defect, unspecified: Secondary | ICD-10-CM | POA: Diagnosis not present

## 2016-04-01 ENCOUNTER — Encounter: Payer: Self-pay | Admitting: *Deleted

## 2016-04-01 ENCOUNTER — Other Ambulatory Visit: Payer: Self-pay | Admitting: *Deleted

## 2016-04-01 DIAGNOSIS — I272 Other secondary pulmonary hypertension: Secondary | ICD-10-CM | POA: Diagnosis not present

## 2016-04-01 DIAGNOSIS — M1991 Primary osteoarthritis, unspecified site: Secondary | ICD-10-CM | POA: Diagnosis not present

## 2016-04-01 DIAGNOSIS — M5136 Other intervertebral disc degeneration, lumbar region: Secondary | ICD-10-CM | POA: Diagnosis not present

## 2016-04-01 DIAGNOSIS — I5032 Chronic diastolic (congestive) heart failure: Secondary | ICD-10-CM | POA: Diagnosis not present

## 2016-04-01 DIAGNOSIS — N186 End stage renal disease: Secondary | ICD-10-CM | POA: Diagnosis not present

## 2016-04-01 DIAGNOSIS — Z794 Long term (current) use of insulin: Secondary | ICD-10-CM | POA: Diagnosis not present

## 2016-04-01 DIAGNOSIS — I110 Hypertensive heart disease with heart failure: Secondary | ICD-10-CM | POA: Diagnosis not present

## 2016-04-01 DIAGNOSIS — Z79899 Other long term (current) drug therapy: Secondary | ICD-10-CM | POA: Diagnosis not present

## 2016-04-01 DIAGNOSIS — E1122 Type 2 diabetes mellitus with diabetic chronic kidney disease: Secondary | ICD-10-CM | POA: Diagnosis not present

## 2016-04-01 DIAGNOSIS — S065X0S Traumatic subdural hemorrhage without loss of consciousness, sequela: Secondary | ICD-10-CM | POA: Diagnosis not present

## 2016-04-01 DIAGNOSIS — G9340 Encephalopathy, unspecified: Secondary | ICD-10-CM | POA: Diagnosis not present

## 2016-04-01 NOTE — Patient Outreach (Signed)
[facility]) Care Management  04/01/2016  [name] [birth year] [id number]   CSW received a new referral on patient from [name] with [facility] Care Management, [name], indicating that patient would benefit from social work services and resources to assist with completion of an Adult Medicaid application through the [employer] [facility].  [name] went on to say that patient will also require assistance with completion of an application for [facility] (Personal Care Services) through [facility].  [name] has already spoken with patient's son and HealthCare Power of [name], [name], explaining to him that if patient does not qualify for Adult Medicaid coverage then patient is not eligible for [facility] Services and that home care services would be an out-of-pocket expense.  [name] indicated that she recently referred patient to [facility] so that patient can obtain additional community resources and support. CSW made an initial attempt to try and contact patient and patient's wife, [name] today to perform the initial phone assessment, as well as assess and assist with social work needs and services, without success.  A HIPAA compliant message was left on voicemail.  CSW is currently awaiting a return call.  CSW was able to make contact with patient's son, [name] today to perform the initial phone assessment, as well as assess and assist with social work needs and services.  CSW explained to [name] that CSW has placed a packet of resource information in the mail for their review.  This packet of resource information will include all of the following: Brochure for PACE (Program of All-Inclusive Care for the Elderly) Complete listing of all agencies offering home care services Adult Medicaid application through the [facility] Department of Social Services [facility] (Personal Care Services)  application CSW explained to [name] that CSW would be contacting him in one week to ensure that the packet of resource information was received, as well as answer any questions that they may have pertaining to the information received.  [name] voiced understanding and was agreeable to this plan. [name], BSW, MSW, LCSW  Licensed Restaurant manager, [employer] food [facility]  Mailing [address] [address] [place], [place] [address] Physical Address-[address], [place], [place] [address] Toll Free Main # [phone] Fax # [phone] Cell # [phone]  Fax # [phone]  [email or web address]

## 2016-04-01 NOTE — Patient Outreach (Signed)
[facility]) Care Management  04/01/2016  [name] 18-Apr-1932 [id number]  Covering for Cindra Presume Case Manager Transition of care week 2  RN spoke with pt's son Riggs Dineen) due to his dementia. Introduced [facility] and purpose for today call in covering for [employer]. Son very receptive and Freight forwarder on pt's status. Reports pt doing well with services provided today from PT/OT. Reports pt's mental statu unchanged but cooporated well. RN verified pt continues to attend all medical appointments and take all prescribed medications with no problems. Caregiver verified the above however continues to awaiting resources packet via Child psychotherapist from [facility] concerning discussed information for MCD application and other assisted programs for this pt (note [employer]. Has noted on this pt's involvement). Will report to involved case manage to follow up accordingly and continue ongoing transition of care contacts based upon pt's recent discharge. No other inquires or request at this time.   Elliot Cousin, RN Care Management Coordinator [facility] Main Office [phone]

## 2016-04-02 ENCOUNTER — Telehealth: Payer: Self-pay | Admitting: *Deleted

## 2016-04-02 DIAGNOSIS — Z452 Encounter for adjustment and management of vascular access device: Secondary | ICD-10-CM | POA: Diagnosis not present

## 2016-04-02 DIAGNOSIS — D509 Iron deficiency anemia, unspecified: Secondary | ICD-10-CM | POA: Diagnosis not present

## 2016-04-02 DIAGNOSIS — A4902 Methicillin resistant Staphylococcus aureus infection, unspecified site: Secondary | ICD-10-CM | POA: Diagnosis not present

## 2016-04-02 DIAGNOSIS — D689 Coagulation defect, unspecified: Secondary | ICD-10-CM | POA: Diagnosis not present

## 2016-04-02 DIAGNOSIS — N186 End stage renal disease: Secondary | ICD-10-CM | POA: Diagnosis not present

## 2016-04-02 DIAGNOSIS — D631 Anemia in chronic kidney disease: Secondary | ICD-10-CM | POA: Diagnosis not present

## 2016-04-02 DIAGNOSIS — E1129 Type 2 diabetes mellitus with other diabetic kidney complication: Secondary | ICD-10-CM | POA: Diagnosis not present

## 2016-04-02 NOTE — Telephone Encounter (Signed)
Drenda Freeze with [facility] called and stated that patient was referred by [facility] to the [facility] for [facility] and would like to know if you will be the Attending. Please Advise.

## 2016-04-03 NOTE — Telephone Encounter (Signed)
That is fine with me.  Any help they can offer with his back pain management when most drugs are contraindicated b/c of his dialysis would be great.  His family has been trying to get him more help at home and has been managing his meds for him, but there have still been challenges.  He and his wife both have some dementia.

## 2016-04-05 DIAGNOSIS — Z992 Dependence on renal dialysis: Secondary | ICD-10-CM | POA: Diagnosis not present

## 2016-04-05 DIAGNOSIS — I120 Hypertensive chronic kidney disease with stage 5 chronic kidney disease or end stage renal disease: Secondary | ICD-10-CM | POA: Diagnosis not present

## 2016-04-05 DIAGNOSIS — A4902 Methicillin resistant Staphylococcus aureus infection, unspecified site: Secondary | ICD-10-CM | POA: Diagnosis not present

## 2016-04-05 DIAGNOSIS — D509 Iron deficiency anemia, unspecified: Secondary | ICD-10-CM | POA: Diagnosis not present

## 2016-04-05 DIAGNOSIS — D689 Coagulation defect, unspecified: Secondary | ICD-10-CM | POA: Diagnosis not present

## 2016-04-05 DIAGNOSIS — E1129 Type 2 diabetes mellitus with other diabetic kidney complication: Secondary | ICD-10-CM | POA: Diagnosis not present

## 2016-04-05 DIAGNOSIS — N186 End stage renal disease: Secondary | ICD-10-CM | POA: Diagnosis not present

## 2016-04-05 DIAGNOSIS — Z452 Encounter for adjustment and management of vascular access device: Secondary | ICD-10-CM | POA: Diagnosis not present

## 2016-04-05 DIAGNOSIS — D631 Anemia in chronic kidney disease: Secondary | ICD-10-CM | POA: Diagnosis not present

## 2016-04-05 NOTE — Telephone Encounter (Signed)
Drenda Freeze Notified

## 2016-04-06 ENCOUNTER — Other Ambulatory Visit: Payer: Self-pay | Admitting: *Deleted

## 2016-04-06 ENCOUNTER — Observation Stay (HOSPITAL_COMMUNITY)
Admission: EM | Admit: 2016-04-06 | Discharge: 2016-04-09 | Disposition: A | Discharge status: Home / Self Care | Payer: Medicare Other | Attending: Internal Medicine | Admitting: Internal Medicine

## 2016-04-06 ENCOUNTER — Emergency Department (HOSPITAL_COMMUNITY): Payer: Medicare Other

## 2016-04-06 ENCOUNTER — Telehealth: Payer: Self-pay | Admitting: *Deleted

## 2016-04-06 ENCOUNTER — Encounter (HOSPITAL_COMMUNITY): Payer: Self-pay | Admitting: *Deleted

## 2016-04-06 DIAGNOSIS — Z515 Encounter for palliative care: Secondary | ICD-10-CM

## 2016-04-06 DIAGNOSIS — E1121 Type 2 diabetes mellitus with diabetic nephropathy: Secondary | ICD-10-CM | POA: Diagnosis present

## 2016-04-06 DIAGNOSIS — I5032 Chronic diastolic (congestive) heart failure: Secondary | ICD-10-CM | POA: Diagnosis present

## 2016-04-06 DIAGNOSIS — Z992 Dependence on renal dialysis: Secondary | ICD-10-CM

## 2016-04-06 DIAGNOSIS — B029 Zoster without complications: Secondary | ICD-10-CM | POA: Diagnosis not present

## 2016-04-06 DIAGNOSIS — I120 Hypertensive chronic kidney disease with stage 5 chronic kidney disease or end stage renal disease: Secondary | ICD-10-CM | POA: Insufficient documentation

## 2016-04-06 DIAGNOSIS — G9340 Encephalopathy, unspecified: Secondary | ICD-10-CM | POA: Diagnosis not present

## 2016-04-06 DIAGNOSIS — N189 Chronic kidney disease, unspecified: Secondary | ICD-10-CM

## 2016-04-06 DIAGNOSIS — R404 Transient alteration of awareness: Secondary | ICD-10-CM | POA: Diagnosis not present

## 2016-04-06 DIAGNOSIS — D631 Anemia in chronic kidney disease: Secondary | ICD-10-CM | POA: Diagnosis present

## 2016-04-06 DIAGNOSIS — Z79899 Other long term (current) drug therapy: Secondary | ICD-10-CM | POA: Diagnosis not present

## 2016-04-06 DIAGNOSIS — Z794 Long term (current) use of insulin: Secondary | ICD-10-CM | POA: Diagnosis not present

## 2016-04-06 DIAGNOSIS — Z7189 Other specified counseling: Secondary | ICD-10-CM

## 2016-04-06 DIAGNOSIS — G40909 Epilepsy, unspecified, not intractable, without status epilepticus: Secondary | ICD-10-CM

## 2016-04-06 DIAGNOSIS — N186 End stage renal disease: Secondary | ICD-10-CM | POA: Insufficient documentation

## 2016-04-06 DIAGNOSIS — F0390 Unspecified dementia without behavioral disturbance: Secondary | ICD-10-CM | POA: Diagnosis present

## 2016-04-06 DIAGNOSIS — E1122 Type 2 diabetes mellitus with diabetic chronic kidney disease: Secondary | ICD-10-CM | POA: Insufficient documentation

## 2016-04-06 DIAGNOSIS — R531 Weakness: Secondary | ICD-10-CM | POA: Diagnosis not present

## 2016-04-06 DIAGNOSIS — R51 Headache: Secondary | ICD-10-CM | POA: Diagnosis not present

## 2016-04-06 DIAGNOSIS — D696 Thrombocytopenia, unspecified: Secondary | ICD-10-CM | POA: Diagnosis present

## 2016-04-06 DIAGNOSIS — I10 Essential (primary) hypertension: Secondary | ICD-10-CM | POA: Diagnosis present

## 2016-04-06 DIAGNOSIS — L8990 Pressure ulcer of unspecified site, unspecified stage: Secondary | ICD-10-CM | POA: Insufficient documentation

## 2016-04-06 DIAGNOSIS — R4182 Altered mental status, unspecified: Secondary | ICD-10-CM | POA: Diagnosis not present

## 2016-04-06 LAB — COMPREHENSIVE METABOLIC PANEL
ALT: 26 U/L (ref 17–63)
ANION GAP: 11 (ref 5–15)
AST: 41 U/L (ref 15–41)
Albumin: 3.1 g/dL — ABNORMAL LOW (ref 3.5–5.0)
Alkaline Phosphatase: 56 U/L (ref 38–126)
BUN: 36 mg/dL — ABNORMAL HIGH (ref 6–20)
CALCIUM: 9.5 mg/dL (ref 8.9–10.3)
CHLORIDE: 104 mmol/L (ref 101–111)
CO2: 22 mmol/L (ref 22–32)
CREATININE: 6.8 mg/dL — AB (ref 0.61–1.24)
GFR, EST AFRICAN AMERICAN: 8 mL/min — AB (ref >60)
GFR, EST NON AFRICAN AMERICAN: 7 mL/min — AB (ref >60)
Glucose, Bld: 158 mg/dL — ABNORMAL HIGH (ref 65–99)
Potassium: 4.5 mmol/L (ref 3.5–5.1)
Sodium: 137 mmol/L (ref 135–145)
Total Bilirubin: 0.6 mg/dL (ref 0.3–1.2)
Total Protein: 6.7 g/dL (ref 6.5–8.1)

## 2016-04-06 LAB — URINE MICROSCOPIC-ADD ON: BACTERIA UA: NONE SEEN

## 2016-04-06 LAB — URINALYSIS, ROUTINE W REFLEX MICROSCOPIC
BILIRUBIN URINE: NEGATIVE
Glucose, UA: 100 mg/dL — AB
KETONES UR: NEGATIVE mg/dL
NITRITE: NEGATIVE
Protein, ur: 100 mg/dL — AB
SPECIFIC GRAVITY, URINE: 1.013 (ref 1.005–1.030)
pH: 8 (ref 5.0–8.0)

## 2016-04-06 LAB — CBC WITH DIFFERENTIAL/PLATELET
BASOS ABS: 0 10*3/uL (ref 0.0–0.1)
Basophils Relative: 0 %
Eosinophils Absolute: 0 10*3/uL (ref 0.0–0.7)
Eosinophils Relative: 0 %
HEMATOCRIT: 33.5 % — AB (ref 39.0–52.0)
HEMOGLOBIN: 10.8 g/dL — AB (ref 13.0–17.0)
LYMPHS ABS: 1.5 10*3/uL (ref 0.7–4.0)
LYMPHS PCT: 36 %
MCH: 32.5 pg (ref 26.0–34.0)
MCHC: 32.2 g/dL (ref 30.0–36.0)
MCV: 100.9 fL — AB (ref 78.0–100.0)
Monocytes Absolute: 0.6 10*3/uL (ref 0.1–1.0)
Monocytes Relative: 14 %
NEUTROS ABS: 2 10*3/uL (ref 1.7–7.7)
Neutrophils Relative %: 49 %
Platelets: 56 10*3/uL — ABNORMAL LOW (ref 150–400)
RBC: 3.32 MIL/uL — AB (ref 4.22–5.81)
RDW: 16.3 % — ABNORMAL HIGH (ref 11.5–15.5)
WBC: 4 10*3/uL (ref 4.0–10.5)

## 2016-04-06 LAB — I-STAT CG4 LACTIC ACID, ED: LACTIC ACID, VENOUS: 1.53 mmol/L (ref 0.5–1.9)

## 2016-04-06 LAB — PROTIME-INR
INR: 1.14
Prothrombin Time: 14.7 seconds (ref 11.4–15.2)

## 2016-04-06 LAB — I-STAT TROPONIN, ED: Troponin i, poc: 0.07 ng/mL (ref 0.00–0.08)

## 2016-04-06 LAB — VALPROIC ACID LEVEL: VALPROIC ACID LVL: 16 ug/mL — AB (ref 50.0–100.0)

## 2016-04-06 LAB — GLUCOSE, CAPILLARY: GLUCOSE-CAPILLARY: 218 mg/dL — AB (ref 65–99)

## 2016-04-06 LAB — AMMONIA: AMMONIA: 33 umol/L (ref 9–35)

## 2016-04-06 MED ORDER — FOLIC ACID 1 MG PO TABS
1.0000 mg | ORAL_TABLET | Freq: Every day | ORAL | Status: DC
  Administered 2016-04-06 – 2016-04-07 (×2): 1 mg via ORAL
  Filled 2016-04-06 (×2): qty 1

## 2016-04-06 MED ORDER — ACETAMINOPHEN 650 MG RE SUPP: 650.0000 mg | Freq: Four times a day (QID) | RECTAL | Status: DC | PRN

## 2016-04-06 MED ORDER — LACOSAMIDE 150 MG PO TABS: 150.0000 mg | ORAL_TABLET | Freq: Two times a day (BID) | ORAL | Status: DC

## 2016-04-06 MED ORDER — NEPRO/CARBSTEADY PO LIQD
237.0000 mL | Freq: Two times a day (BID) | ORAL | Status: DC
  Administered 2016-04-07 (×2): 237 mL via ORAL
  Filled 2016-04-06 (×4): qty 237

## 2016-04-06 MED ORDER — SENNA-DOCUSATE SODIUM 8.6-50 MG PO TABS: 1.0000 | ORAL_TABLET | Freq: Every evening | ORAL | Status: DC

## 2016-04-06 MED ORDER — LORATADINE 10 MG PO TABS
10.0000 mg | ORAL_TABLET | Freq: Every day | ORAL | Status: DC
  Administered 2016-04-07: 10 mg via ORAL
  Filled 2016-04-06: qty 1

## 2016-04-06 MED ORDER — CALCIUM ACETATE (PHOS BINDER) 667 MG PO CAPS
2001.0000 mg | ORAL_CAPSULE | Freq: Three times a day (TID) | ORAL | Status: DC
  Administered 2016-04-07 (×2): 2001 mg via ORAL
  Filled 2016-04-06 (×5): qty 3

## 2016-04-06 MED ORDER — INSULIN ASPART 100 UNIT/ML ~~LOC~~ SOLN
0.0000 [IU] | Freq: Every day | SUBCUTANEOUS | Status: DC
  Administered 2016-04-06: 2 [IU] via SUBCUTANEOUS

## 2016-04-06 MED ORDER — AMLODIPINE BESYLATE 5 MG PO TABS
5.0000 mg | ORAL_TABLET | Freq: Every day | ORAL | Status: DC
  Administered 2016-04-06 – 2016-04-08 (×3): 5 mg via ORAL
  Filled 2016-04-06 (×3): qty 1

## 2016-04-06 MED ORDER — SENNOSIDES-DOCUSATE SODIUM 8.6-50 MG PO TABS
1.0000 | ORAL_TABLET | Freq: Every day | ORAL | Status: DC
  Administered 2016-04-06: 1 via ORAL
  Filled 2016-04-06: qty 1

## 2016-04-06 MED ORDER — GABAPENTIN 100 MG PO CAPS
100.0000 mg | ORAL_CAPSULE | Freq: Three times a day (TID) | ORAL | Status: DC
  Administered 2016-04-06 – 2016-04-09 (×6): 100 mg via ORAL
  Filled 2016-04-06 (×5): qty 1

## 2016-04-06 MED ORDER — FOLIC ACID 1 MG PO TABS: 1.0000 mg | ORAL_TABLET | Freq: Every day | ORAL | Status: DC

## 2016-04-06 MED ORDER — DIVALPROEX SODIUM ER 500 MG PO TB24: 500.0000 mg | ORAL_TABLET | Freq: Every day | ORAL | Status: DC

## 2016-04-06 MED ORDER — LACOSAMIDE 50 MG PO TABS
150.0000 mg | ORAL_TABLET | Freq: Two times a day (BID) | ORAL | Status: DC
  Administered 2016-04-06 – 2016-04-09 (×5): 150 mg via ORAL
  Filled 2016-04-06 (×4): qty 3

## 2016-04-06 MED ORDER — CALCIUM ACETATE 667 MG PO CAPS: 2001.0000 mg | ORAL_CAPSULE | Freq: Three times a day (TID) | ORAL | Status: DC

## 2016-04-06 MED ORDER — DIVALPROEX SODIUM ER 500 MG PO TB24
500.0000 mg | ORAL_TABLET | Freq: Every day | ORAL | Status: DC
  Administered 2016-04-06 – 2016-04-07 (×2): 500 mg via ORAL
  Filled 2016-04-06 (×2): qty 1

## 2016-04-06 MED ORDER — CALCITRIOL 0.5 MCG PO CAPS
1.0000 ug | ORAL_CAPSULE | ORAL | Status: DC
  Administered 2016-04-07: 1 ug via ORAL
  Filled 2016-04-06: qty 2

## 2016-04-06 MED ORDER — DEXTROSE 5 % IV SOLN
5.0000 mg/kg | INTRAVENOUS | Status: DC
  Administered 2016-04-06 – 2016-04-08 (×3): 450 mg via INTRAVENOUS
  Filled 2016-04-06 (×4): qty 9

## 2016-04-06 MED ORDER — RENA-VITE PO TABS
1.0000 | ORAL_TABLET | Freq: Every day | ORAL | Status: DC
  Administered 2016-04-06 – 2016-04-07 (×2): 1 via ORAL
  Filled 2016-04-06 (×2): qty 1

## 2016-04-06 MED ORDER — ACETAMINOPHEN 325 MG PO TABS
650.0000 mg | ORAL_TABLET | Freq: Four times a day (QID) | ORAL | Status: DC | PRN
  Administered 2016-04-07: 650 mg via ORAL
  Filled 2016-04-06: qty 2

## 2016-04-06 MED ORDER — INSULIN ASPART 100 UNIT/ML ~~LOC~~ SOLN
0.0000 [IU] | Freq: Three times a day (TID) | SUBCUTANEOUS | Status: DC
  Administered 2016-04-07: 3 [IU] via SUBCUTANEOUS
  Administered 2016-04-07 – 2016-04-08 (×3): 2 [IU] via SUBCUTANEOUS

## 2016-04-06 MED ORDER — SODIUM CHLORIDE 0.9% FLUSH
3.0000 mL | Freq: Two times a day (BID) | INTRAVENOUS | Status: DC
  Administered 2016-04-06 – 2016-04-09 (×4): 3 mL via INTRAVENOUS

## 2016-04-06 NOTE — Telephone Encounter (Signed)
Randolm Idol, [facility] called with her assessment and stated that patient was very lethargic, difficult to arouse. And he continued to be that way. Was able to get him up and walk alittle with his walker but had to get him a wheelchair due to about to fall. Vitals were ok. Family states that his condition is worsening. She suggested that the family take him to the ER to be evaluated because she thinks patient needs to be in a Rehab.

## 2016-04-06 NOTE — Telephone Encounter (Signed)
Noted, I see he went to the ED.  Suspect he was postictal when she saw him or post HD

## 2016-04-06 NOTE — ED Notes (Signed)
MRI called and informed this RN they are unable to complete a MRI on pt rt noncompatable pacer/defib.

## 2016-04-06 NOTE — Patient Outreach (Signed)
[facility]) Care Management  04/06/2016  [name] 02-16-1932 [id number]   Randolm Idol, Case Manager with [facility] called to inform CSW that patient was being taken to the Emergency Department at [facility] via Emergency Medical Services today.  [name] went on to say that patient has been experiencing symptoms of lethargy, weakness and confusion, for the past 48 hours.  CSW will continue to follow patient while hospitalized to assess and assist with possible discharge planning needs and services. Danford Bad, BSW, MSW, LCSW  Licensed Restaurant manager, fast food [facility]  Mailing [place] [address], [place], [place] [address] Physical Address[address], [place], [place] [address] Toll Free Main # [phone] Fax # [phone] Cell # [phone]  Fax # [phone]  [email or web address]

## 2016-04-06 NOTE — H&P (Signed)
History and Physical    [name] HYI:[id number] DOB: Aug 11, 1932 DOA: 04/06/2016   PCP: [name], DO   Patient coming from/Resides with: Private residence/lives with spouse who has dementia  Chief Complaint: Acute mental status changes  HPI: [name] is a 82 y.o. male with medical history significant for prior subdural hematoma, known seizure disorder on 2 medications, chronic kidney disease on dialysis, diabetes and hypertension. It is also suspect the patient may have a degree of underlying dementia as well. Patient was recently discharged from the hospital on 7/19 after an admission for acute encephalopathy felt to be related to bacteremia secondary to staph dermatitis. He was discharged on IV vancomycin to be given at dialysis treatments and completed his last dose on 7/22. At time of presentation during last impression he had uncontrolled blood pressure with improvement in his blood pressure after resuming amlodipine. Since his mental status improved with treatment of infectious causes EEG was not completed and it was felt that his encephalopathy was not related to his underlying seizure disorder.  According to family, since returning home patient has been doing quite well and has improved up until a few days ago when he began developing progressive weakness. He did go to dialysis yesterday and apparently tolerated this well without any complications. This morning when the son noticed the patient was sitting on the side of the bed and would not verbally respond to him or make eye contact. There is no apparent incontinence of bowel or bladder. Because of the symptoms family brought the patient to the ER to be evaluated further.  ED Course:  Vital signs: 99.6-150/70-75-15- RA 94 % PCXR: Lungs clear without any acute infections findings, central catheter tip is in the superior vena cava CT head without contrast: No acute intracranial findings Lab data: Sodium 137, potassium 4.5, CO2  22, BUN 36, creatinine 6.8, glucose 158, albumin 3.1, poc troponin 0.07, lactic acid 1.53, WBC 4000 with normal differential, hemoglobin 10.8, platelets 56,000, valproic acid level subtherapeutic at 16; urinalysis unremarkable Medications and treatments: Zovirax 450 mg IV 1  Review of Systems:  In addition to the HPI above,  No Fever-chills, myalgias or other constitutional symptoms No Headache, changes with Vision or hearing, new weakness, tingling, numbness in any extremity No problems swallowing food or Liquids, indigestion/reflux No Chest pain, Cough or Shortness of Breath, palpitations, orthopnea or DOE No Abdominal pain, N/V; no melena or hematochezia, no dark tarry stools No dysuria, hematuria or flank pain No new skin rashes, lesions, masses or bruises, No new joints pains-aches No recent weight gain or loss No polyuria, polydypsia or polyphagia,   Past Medical History:  Diagnosis Date  . Arthritis   . Diabetes mellitus without complication (HCC)   . ESRD (end stage renal disease) on dialysis [facility])    "MWF" (03/04/2016)  . GI bleed   . History of blood transfusion   . Hypertension   . Pulmonary hypertension (HCC)    severe pulmonary HTN by 01/14/13 echo  . Seizure (HCC)   . Sleep apnea    wears CPAP n/c  . Subdural hematoma (HCC) 05/2015   from fall    Past Surgical History:  Procedure Laterality Date  . AV FISTULA PLACEMENT Left 09/09/2015   Procedure: INSERTION OF ARTERIOVENOUS  GORE-TEX GRAFT Left  ARM;  Surgeon: Sherren Kerns, MD;  Location: [facility] OR;  Service: Vascular;  Laterality: Left;  . COLONOSCOPY  10/04/2012   Procedure: COLONOSCOPY;  Surgeon: Theda Belfast, MD;  Location:  [facility] ENDOSCOPY;  Service: Endoscopy;  Laterality: N/A;  . COLONOSCOPY WITH ESOPHAGOGASTRODUODENOSCOPY (EGD) Left 11/30/2012   Procedure: COLONOSCOPY WITH ESOPHAGOGASTRODUODENOSCOPY (EGD);  Surgeon: Theda Belfast, MD;  Location: [facility] ENDOSCOPY;  Service: Endoscopy;  Laterality: Left;  . EP  IMPLANTABLE DEVICE N/A 12/12/2015   Procedure: Pacemaker Implant;  Surgeon: Will Jorja Loa, MD;  Location: [facility] INVASIVE CV LAB;  Service: Cardiovascular;  Laterality: N/A;  . ESOPHAGOGASTRODUODENOSCOPY  10/03/2012   Procedure: ESOPHAGOGASTRODUODENOSCOPY (EGD);  Surgeon: Theda Belfast, MD;  Location: [facility] ENDOSCOPY;  Service: Endoscopy;  Laterality: N/A;  . INSERTION OF DIALYSIS CATHETER Right 11/27/2015   Procedure: INSERTION OF DIALYSIS CATHETER - Right Internal Jugular Placement;  Surgeon: Chuck Hint, MD;  Location: [facility] OR;  Service: Vascular;  Laterality: Right;  . LIGATION ARTERIOVENOUS GORTEX GRAFT Left 01/27/2016   Procedure: LEFT LIGATION ARTERIOVENOUS GORTEX GRAFT;  Surgeon: Chuck Hint, MD;  Location: [facility] OR;  Service: Vascular;  Laterality: Left;  . PERIPHERAL VASCULAR CATHETERIZATION Left 01/27/2016   Procedure: Fistulagram;  Surgeon: Nada Libman, MD;  Location: [facility] INVASIVE CV LAB;  Service: Cardiovascular;  Laterality: Left;  upper arm  . PERIPHERAL VASCULAR CATHETERIZATION N/A 03/25/2016   Procedure: venogram;  Surgeon: Fransisco Hertz, MD;  Location: [facility] INVASIVE CV LAB;  Service: Cardiovascular;  Laterality: N/A;  . RETINAL LASER PROCEDURE Right 04/2015  . SPINE SURGERY  2004   back fusion/ [facility] Penumalli,MD    Social History   Social History  . Marital status: Married    Spouse name: patricia  . Number of children: 2  . Years of education: PHD   Occupational History  . Retired    Social History Main Topics  . Smoking status: Never Smoker  . Smokeless tobacco: Never Used  . Alcohol use No  . Drug use: No  . Sexual activity: No   Other Topics Concern  . Not on file   Social History Narrative   Patient currently in [facility] rehab.   Caffeine Use: 1-2 cups occasionally    Mobility: Walker or cane as needed Work history: Disabled   Allergies  Allergen Reactions  . Aspirin Other (See Comments)    Bleeding   . Lactose  Intolerance (Gi) Diarrhea    Family History  Problem Relation Age of Onset  . Sudden death Mother   . Sudden death Father      Prior to Admission medications   Medication Sig Start Date End Date Taking? Authorizing Provider  amLODipine (NORVASC) 5 MG tablet Take 1 tablet (5 mg total) by mouth at bedtime. 02/26/16  Yes [name] L [name], DO  calcitRIOL (ROCALTROL) 0.5 MCG capsule Take 2 capsules (1 mcg total) by mouth every Monday, Wednesday, and Friday with hemodialysis. 02/26/16  Yes [name] L [name], DO  Lacosamide (VIMPAT) 150 MG TABS Take 150 mg by mouth 2 (two) times daily.   Yes Historical Provider, MD  calcium acetate (PHOSLO) 667 MG capsule Take 3 capsules by mouth three times a day 02/26/16   [name] L [name], DO  colchicine 0.6 MG tablet Take 0.6 mg by mouth daily as needed. gout 02/26/16   Historical Provider, MD  divalproex (DEPAKOTE ER) 500 MG 24 hr tablet Take 1 tablet (500 mg total) by mouth daily. 03/21/16   Inez Catalina, MD  fexofenadine (ALLEGRA) 180 MG tablet Take 1 tablet (180 mg total) by mouth daily as needed for allergies or rhinitis. 02/26/16   [name] L [name], DO  folic acid (FOLVITE) 1  MG tablet Take 1 tablet (1 mg total) by mouth daily. 02/26/16   [name] L [name], DO  gabapentin (NEURONTIN) 100 MG capsule Take 1 capsule (100 mg total) by mouth 3 (three) times daily. 02/26/16   [name] L [name], DO  glucose blood (ACCU-CHEK AVIVA PLUS) test strip 1 each by Other route 3 (three) times daily. Use as instructed 01/21/16   [name] L [name], DO  HUMALOG KWIKPEN 100 UNIT/ML KiwkPen Inject 5 Units into the skin See admin instructions. Per sliding scale 02/19/16   Historical Provider, MD  Insulin Pen Needle 31G X 8 MM MISC Use as directed with insulin pens 03/01/16   [name] L [name], DO  Lacosamide 150 MG TABS Take 1 tablet (150 mg total) by mouth 2 (two) times daily. 03/17/16   Shanker Levora Dredge, MD  multivitamin (RENA-VIT) TABS tablet Take 1 tablet by mouth at bedtime. 02/26/16   [name] L [name],  DO  Nutritional Supplements (FEEDING SUPPLEMENT, NEPRO CARB STEADY,) LIQD Take 237 mLs by mouth 2 (two) times daily between meals. 03/21/16   Inez Catalina, MD  pantoprazole (PROTONIX) 40 MG tablet Take 1 tablet (40 mg total) by mouth 2 (two) times daily. 02/26/16   [name] L [name], DO  sennosides-docusate sodium (SENOKOT-S) 8.6-50 MG tablet Take 1 tablet by mouth every evening. 02/26/16   [name] L [name], DO  vancomycin (VANCOCIN) 1-5 GM/200ML-% SOLN Inject 200 mLs (1,000 mg total) into the vein every Monday, Wednesday, and Friday with hemodialysis. STOP DATE 03/27/16 03/21/16   Inez Catalina, MD  VIMPAT 50 MG TABS tablet Take 150 mg by mouth 2 (two) times daily. 03/09/16   Historical Provider, MD    Physical Exam: Vitals:   04/06/16 1246 04/06/16 1345 04/06/16 1415 04/06/16 1445  BP:  (!) 164/46 (!) 140/44 137/60  Pulse:  72 66   Resp:      Temp: 99.6 F (37.6 C)     TempSrc: Oral     SpO2:  97% 97% 92%      Constitutional: NAD, calm, comfortable-Patient will respond when spoken to with a loud voice accompanied by tactile stimulation Eyes: PERRL, lids and conjunctivae normal ENMT: Mucous membranes are moist. Posterior pharynx clear of any exudate or lesions.Normal dentition.  Neck: normal, supple, no masses, no thyromegaly-rash right lateral neck concerning for possible zoster outbreak (see below) Respiratory: clear to auscultation bilaterally, no wheezing, no crackles. Normal respiratory effort. No accessory muscle use.  Cardiovascular: Regular rate and rhythm, no murmurs / rubs / gallops. No extremity edema. 2+ pedal pulses. No carotid bruits. Right anterior chest dialysis catheter in place without any dressing erythema or any drainage noted on current dressing Abdomen: no tenderness, no masses palpated. No hepatosplenomegaly. Bowel sounds positive.  Musculoskeletal: no clubbing / cyanosis. No joint deformity upper and lower extremities. Good ROM, no contractures. Normal muscle tone.    Skin: no rashes, lesions, ulcers. No induration-vesicular cluster with underlying erythematous base right lateral neck Neurologic: CN 2-12 grossly intact. Sensation intact, DTR normal. Strength 5/5 x all 4 extremities. Responds and follows commands easily and spoken to with loud voice Psychiatric: Awakens and peers to be oriented oriented x 3. Normal mood.    Labs on Admission: I have personally reviewed following labs and imaging studies  CBC:  Recent Labs Lab 04/06/16 1611  WBC 4.0  NEUTROABS 2.0  HGB 10.8*  HCT 33.5*  MCV 100.9*  PLT 56*   Basic Metabolic Panel:  Recent Labs Lab 04/06/16 1439  NA 137  K 4.5  CL 104  CO2 22  GLUCOSE 158*  BUN 36*  CREATININE 6.80*  CALCIUM 9.5   GFR: Estimated Creatinine Clearance: 9.2 mL/min (by C-G formula based on SCr of 6.8 mg/dL). Liver Function Tests:  Recent Labs Lab 04/06/16 1439  AST 41  ALT 26  ALKPHOS 56  BILITOT 0.6  PROT 6.7  ALBUMIN 3.1*   No results for input(s): LIPASE, AMYLASE in the last 168 hours.  Recent Labs Lab 04/06/16 1440  AMMONIA 33   Coagulation Profile:  Recent Labs Lab 04/06/16 1439  INR 1.14   Cardiac Enzymes: No results for input(s): CKTOTAL, CKMB, CKMBINDEX, TROPONINI in the last 168 hours. BNP (last 3 results) No results for input(s): PROBNP in the last 8760 hours. HbA1C: No results for input(s): HGBA1C in the last 72 hours. CBG: No results for input(s): GLUCAP in the last 168 hours. Lipid Profile: No results for input(s): CHOL, HDL, LDLCALC, TRIG, CHOLHDL, LDLDIRECT in the last 72 hours. Thyroid Function Tests: No results for input(s): TSH, T4TOTAL, FREET4, T3FREE, THYROIDAB in the last 72 hours. Anemia Panel: No results for input(s): VITAMINB12, FOLATE, FERRITIN, TIBC, IRON, RETICCTPCT in the last 72 hours. Urine analysis:    Component Value Date/Time   COLORURINE YELLOW 04/06/2016 1550   APPEARANCEUR CLEAR 04/06/2016 1550   LABSPEC 1.013 04/06/2016 1550   PHURINE  8.0 04/06/2016 1550   GLUCOSEU 100 (A) 04/06/2016 1550   HGBUR TRACE (A) 04/06/2016 1550   BILIRUBINUR NEGATIVE 04/06/2016 1550   KETONESUR NEGATIVE 04/06/2016 1550   PROTEINUR 100 (A) 04/06/2016 1550   UROBILINOGEN 0.2 06/20/2015 0845   NITRITE NEGATIVE 04/06/2016 1550   LEUKOCYTESUR TRACE (A) 04/06/2016 1550   Sepsis Labs: @LABRCNTIP (procalcitonin:4,lacticidven:4) )No results found for this or any previous visit (from the past 240 hour(s)).   Radiological Exams on Admission: Ct Head Wo Contrast  Result Date: 04/06/2016 CLINICAL DATA:  Increasing weakness over the past 2 days with confusion. Headache. EXAM: CT HEAD WITHOUT CONTRAST TECHNIQUE: Contiguous axial images were obtained from the base of the skull through the vertex without intravenous contrast. COMPARISON:  03/19/2016 FINDINGS: The ventricles and cisterns are within normal. There is mild age related atrophic change. There is moderate chronic ischemic microvascular disease. Bilateral basal ganglia calcifications are present. There is no mass, mass effect, shift of midline structures or acute hemorrhage. There is no evidence to suggest acute infarction. Minimal mucosal membrane thickening over the floor the right maxillary sinus. Remaining bony structures are within normal. IMPRESSION: No acute intracranial findings per Mild age related atrophy and moderate chronic ischemic microvascular disease. Subtle chronic sinus inflammatory change. Electronically Signed   By: Elberta Fortis M.D.   On: 04/06/2016 15:34   Dg Chest Portable 1 View  Result Date: 04/06/2016 CLINICAL DATA:  Altered mental status/confusion. End-stage renal disease EXAM: PORTABLE CHEST 1 VIEW COMPARISON:  March 19, 2016 FINDINGS: Lungs are clear. Heart size and pulmonary vascularity are normal. No adenopathy. There is atherosclerotic calcification in the aortic arch. Pacemaker leads are attached to the right atrium and right ventricle. Central catheter tip is in the superior  vena cava. No pneumothorax. IMPRESSION: Lungs clear. Aortic atherosclerosis. Central catheter tip in superior vena cava. No pneumothorax. Electronically Signed   By: Bretta Bang III M.D.   On: 04/06/2016 13:25    EKG: (Independently reviewed) sinus rhythm with ventricular rate 80 bpm, QTC 480 ms, no change in his previous EKG in no acute ischemic changes  Assessment/Plan Principal Problem:   Acute encephalopathy -Patient  has a history of recurrent encephalopathy related to either infectious causes and/or post ictal state; patient has improved since arrival to ER and current presentation appears to be related to postictal state -Patient also has vesicular eruption concerning for possible herpes zoster and we are currently empirically treating with IV Zovirax and are including possible herpetic encephalitis in the differential -Patient's pacemaker is not compatible with MRI scanner  Active Problems:   Seizure disorder  -Valproic acid level was subtherapeutic at 16 -Continue Vimpat and Depakote -Concerned that patient's Protonix could be affecting absorption of his valproic acid so I have discontinued for now    Herpes zoster -Continue empiric Zovirax -Airborne and contact isolation    Hypertension -Current blood pressure controlled -Continue preadmission Norvasc    Anemia in chronic kidney disease -Hemoglobin stable and at baseline of 11.4 (currently 10.8 but does need dialysis tomorrow)    ESRD on dialysis  -Dialyzes on MWF -Potassium stable and does not appear to be volume overloaded -Please notify nephrology of need for dialysis on 8/2 since patient was admitted "after hours"    Controlled type 2 diabetes mellitus with diabetic nephropathy, with long-term current use of insulin  -Was utilizing short acting SSI at home -Follow CBGs and provide insulin per SSI protocol    Dementia -Suspect patient may have a degree of dementia -Recommend outpatient evaluation if  noncardiac contrast by PCP    Chronic diastolic congestive heart failure  -Appears compensated and primary mode of treatment is blood pressure control as well as dialysis    Thrombocytopenia -Chronic and currently at baseline between ~ 30,000 and 50,000       DVT prophylaxis: No pharmacological DVT prophylaxis-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 8/2-patient dialyzes MWF Admission status: Observation/telemetry    Serenitie Vinton L. ANP-BC [facility] Pager [phone]   If 7PM-7AM, please contact night-coverage [email or web address] Password [facility]  04/06/2016, 5:38 PM

## 2016-04-06 NOTE — ED Triage Notes (Signed)
Pt comes from home via [facility]. Per family pt has been becoming increasingly weak and confused x48 hours. Pt receives dialysis M,W,F and has been compliant with schedule.

## 2016-04-06 NOTE — ED Notes (Signed)
IV team at bedside 

## 2016-04-06 NOTE — Progress Notes (Signed)
Thank you.  I saw where he went to the ED as directed.  Perhaps he had another seizure and was postictal?  Hard to know.  We shall see.

## 2016-04-06 NOTE — Progress Notes (Signed)
Pt just arrived to floor. Orders acknowledged and placed on telemetry

## 2016-04-06 NOTE — Progress Notes (Signed)
Pharmacy Antibiotic Note  [name] is a 82 y.o. male admitted on 04/06/2016 with r/o HSV encephalitis.  Pharmacy has been consulted for acyclovir dosing.  Plan: Acyclovir 5mg /kg IV q24h  Monitor culture data, HD plans and clinical course     Temp (24hrs), Avg:99.6 F (37.6 C), Min:99.6 F (37.6 C), Max:99.6 F (37.6 C)   Recent Labs Lab 04/06/16 1439  CREATININE 6.80*  LATICACIDVEN 1.53    Estimated Creatinine Clearance: 9.2 mL/min (by C-G formula based on SCr of 6.8 mg/dL).    Allergies  Allergen Reactions  . Aspirin Other (See Comments)    Bleeding   . Lactose Intolerance (Gi) Diarrhea    Antimicrobials this admission: Acyclovir 8/1 >>   Dose adjustments this admission: n/a  Microbiology results: 8/1 BCx: sent  UCx:    Sputum:    MRSA PCR:    10/1. Arlean Hopping, PharmD, BCPS Clinical Pharmacist Pager [phone] 04/06/2016 3:40 PM

## 2016-04-06 NOTE — ED Notes (Signed)
Attempted an IV insertion without success. Phlebotomy at bedside.

## 2016-04-07 ENCOUNTER — Ambulatory Visit: Payer: Self-pay | Admitting: *Deleted

## 2016-04-07 ENCOUNTER — Encounter: Payer: Self-pay | Admitting: *Deleted

## 2016-04-07 DIAGNOSIS — E1121 Type 2 diabetes mellitus with diabetic nephropathy: Secondary | ICD-10-CM

## 2016-04-07 DIAGNOSIS — G40909 Epilepsy, unspecified, not intractable, without status epilepticus: Secondary | ICD-10-CM | POA: Diagnosis not present

## 2016-04-07 DIAGNOSIS — G9340 Encephalopathy, unspecified: Secondary | ICD-10-CM

## 2016-04-07 DIAGNOSIS — F0390 Unspecified dementia without behavioral disturbance: Secondary | ICD-10-CM

## 2016-04-07 DIAGNOSIS — B029 Zoster without complications: Secondary | ICD-10-CM | POA: Diagnosis not present

## 2016-04-07 DIAGNOSIS — Z992 Dependence on renal dialysis: Secondary | ICD-10-CM | POA: Diagnosis not present

## 2016-04-07 LAB — RENAL FUNCTION PANEL
ALBUMIN: 2.8 g/dL — AB (ref 3.5–5.0)
Anion gap: 10 (ref 5–15)
BUN: 43 mg/dL — AB (ref 6–20)
CHLORIDE: 102 mmol/L (ref 101–111)
CO2: 25 mmol/L (ref 22–32)
Calcium: 9.4 mg/dL (ref 8.9–10.3)
Creatinine, Ser: 7.72 mg/dL — ABNORMAL HIGH (ref 0.61–1.24)
GFR calc Af Amer: 7 mL/min — ABNORMAL LOW (ref >60)
GFR calc non Af Amer: 6 mL/min — ABNORMAL LOW (ref >60)
GLUCOSE: 166 mg/dL — AB (ref 65–99)
PHOSPHORUS: 4.1 mg/dL (ref 2.5–4.6)
POTASSIUM: 4.3 mmol/L (ref 3.5–5.1)
SODIUM: 137 mmol/L (ref 135–145)

## 2016-04-07 LAB — GLUCOSE, CAPILLARY
GLUCOSE-CAPILLARY: 185 mg/dL — AB (ref 65–99)
Glucose-Capillary: 186 mg/dL — ABNORMAL HIGH (ref 65–99)
Glucose-Capillary: 210 mg/dL — ABNORMAL HIGH (ref 65–99)
Glucose-Capillary: 127 mg/dL — ABNORMAL HIGH (ref 65–99)

## 2016-04-07 LAB — VALPROIC ACID LEVEL: VALPROIC ACID LVL: 30 ug/mL — AB (ref 50.0–100.0)

## 2016-04-07 MED ORDER — DARBEPOETIN ALFA 150 MCG/0.3ML IJ SOSY: 150.0000 ug | PREFILLED_SYRINGE | INTRAMUSCULAR | Status: DC

## 2016-04-07 MED ORDER — SODIUM CHLORIDE 0.9 % IV SOLN: 100.0000 mL | INTRAVENOUS | Status: DC | PRN

## 2016-04-07 MED ORDER — DARBEPOETIN ALFA 150 MCG/0.3ML IJ SOSY
150.0000 ug | PREFILLED_SYRINGE | INTRAMUSCULAR | Status: DC
Start: 2016-04-14 — End: 2016-04-08

## 2016-04-07 MED ORDER — LIDOCAINE HCL (PF) 1 % IJ SOLN: 5.0000 mL | INTRAMUSCULAR | Status: DC | PRN

## 2016-04-07 MED ORDER — HEPARIN SODIUM (PORCINE) 1000 UNIT/ML DIALYSIS: 1000.0000 [IU] | INTRAMUSCULAR | Status: DC | PRN

## 2016-04-07 MED ORDER — ALTEPLASE 2 MG IJ SOLR: 2.0000 mg | Freq: Once | INTRAMUSCULAR | Status: DC | PRN

## 2016-04-07 MED ORDER — PENTAFLUOROPROP-TETRAFLUOROETH EX AERO: 1.0000 "application " | INHALATION_SPRAY | CUTANEOUS | Status: DC | PRN

## 2016-04-07 MED ORDER — LIDOCAINE-PRILOCAINE 2.5-2.5 % EX CREA: 1.0000 "application " | TOPICAL_CREAM | CUTANEOUS | Status: DC | PRN

## 2016-04-07 NOTE — ED Provider Notes (Signed)
[facility]-EMERGENCY DEPT Provider Note   CSN: [id number] Arrival date & time: 04/06/16  1235  First Provider Contact:  First MD Initiated Contact with Patient 04/06/16 1248        History   Chief Complaint Chief Complaint  Patient presents with  . Altered Mental Status    HPI [name] is a 82 y.o. male. BIB family for AMS. He  has a past medical history of Arthritis; Diabetes mellitus without complication (HCC); ESRD (end stage renal disease) on dialysis (HCC); GI bleed; History of blood transfusion; Hypertension; Pulmonary hypertension (HCC); Seizure (HCC); Sleep apnea; and Subdural hematoma (HCC) (05/2015).  He has multiple recent admissions for encephalopathy and has had MRSA sepsis. He dialyzes Monday, Wednesday, Friday, and his most recent dialysis was yesterday. He has been quite compliant with his dialysis. His son gives the history. He states that his mother called this morning because he was sitting on the side of bed and would not respond to questions. He does have a history of seizures but had no witnessed seizure. He has been improving over the past 2 weeks since his previous discharge. Although he has some mental decline and deconditioning. His son states he has been doing much better. They're unsure of what caused this setback. The patient does make urine. He's had no other new symptoms that they're aware of.   HPI  Past Medical History:  Diagnosis Date  . Arthritis   . Diabetes mellitus without complication (HCC)   . ESRD (end stage renal disease) on dialysis [facility])    "MWF" (03/04/2016)  . GI bleed   . History of blood transfusion   . Hypertension   . Pulmonary hypertension (HCC)    severe pulmonary HTN by 01/14/13 echo  . Seizure (HCC)   . Sleep apnea    wears CPAP n/c  . Subdural hematoma (HCC) 05/2015   from fall    Patient Active Problem List   Diagnosis Date Noted  . Herpes zoster 04/06/2016  . End-stage renal disease on hemodialysis (HCC)   . Fever,  unspecified   . Pyrexia 03/18/2016  . Altered mental status 03/16/2016  . Prolonged Q-T interval on ECG 03/16/2016  . Encephalopathy 03/16/2016  . Seizure (HCC)   . Fever 03/04/2016  . SIRS (systemic inflammatory response syndrome) (HCC) 03/04/2016  . Thrombocytopenia (HCC) 03/04/2016  . Delirium 03/04/2016  . S/P placement of cardiac pacemaker 02/27/2016  . SDH (subdural hematoma) x 2 02/27/2016  . Encounter for hospice care discussion   . DNR (do not resuscitate)   . Chronic back pain   . Altered mental state 12/12/2015  . Left arm swelling 12/05/2015  . Midline low back pain without sciatica 12/05/2015  . Controlled type 2 diabetes mellitus with diabetic nephropathy, without long-term current use of insulin (HCC) 12/05/2015  . AV junctional bradycardia 12/05/2015  . Palliative care encounter   . Goals of care, counseling/discussion   . Chronic diastolic congestive heart failure (HCC) 11/20/2015  . Dementia 10/23/2015  . Metabolic encephalopathy 10/23/2015  . Uremia 09/15/2015  . Subdural hematoma (HCC) 07/26/2015  . Acute encephalopathy 06/21/2015  . Pancytopenia (HCC) 06/20/2015  . Word finding difficulty 05/29/2015  . Polyneuropathy (HCC) 05/29/2015  . Laceration of scalp 05/19/2015  . Degenerative disc disease, lumbar 08/15/2014  . Seizure disorder (HCC) 10/17/2013  . Urinary incontinence 10/01/2013  . ESRD on dialysis (HCC) 09/27/2013  . Secondary hyperparathyroidism, renal (HCC) 09/27/2013  . Gout flare 01/17/2013  . Anemia in chronic kidney disease 11/30/2012  .  Hypoglycemia associated with diabetes (HCC) 11/30/2012  . history of AVM (arteriovenous malformation) of colon with hemorrhage 11/29/2012  . Acute GI bleeding 10/02/2012  . Hypertension 10/02/2012    Past Surgical History:  Procedure Laterality Date  . AV FISTULA PLACEMENT Left 09/09/2015   Procedure: INSERTION OF ARTERIOVENOUS  GORE-TEX GRAFT Left  ARM;  Surgeon: Sherren Kerns, MD;  Location: [facility] OR;   Service: Vascular;  Laterality: Left;  . COLONOSCOPY  10/04/2012   Procedure: COLONOSCOPY;  Surgeon: Theda Belfast, MD;  Location: [facility] ENDOSCOPY;  Service: Endoscopy;  Laterality: N/A;  . COLONOSCOPY WITH ESOPHAGOGASTRODUODENOSCOPY (EGD) Left 11/30/2012   Procedure: COLONOSCOPY WITH ESOPHAGOGASTRODUODENOSCOPY (EGD);  Surgeon: Theda Belfast, MD;  Location: [facility] ENDOSCOPY;  Service: Endoscopy;  Laterality: Left;  . EP IMPLANTABLE DEVICE N/A 12/12/2015   Procedure: Pacemaker Implant;  Surgeon: Will Jorja Loa, MD;  Location: [facility] INVASIVE CV LAB;  Service: Cardiovascular;  Laterality: N/A;  . ESOPHAGOGASTRODUODENOSCOPY  10/03/2012   Procedure: ESOPHAGOGASTRODUODENOSCOPY (EGD);  Surgeon: Theda Belfast, MD;  Location: [facility] ENDOSCOPY;  Service: Endoscopy;  Laterality: N/A;  . INSERTION OF DIALYSIS CATHETER Right 11/27/2015   Procedure: INSERTION OF DIALYSIS CATHETER - Right Internal Jugular Placement;  Surgeon: Chuck Hint, MD;  Location: [facility] OR;  Service: Vascular;  Laterality: Right;  . LIGATION ARTERIOVENOUS GORTEX GRAFT Left 01/27/2016   Procedure: LEFT LIGATION ARTERIOVENOUS GORTEX GRAFT;  Surgeon: Chuck Hint, MD;  Location: [facility] OR;  Service: Vascular;  Laterality: Left;  . PERIPHERAL VASCULAR CATHETERIZATION Left 01/27/2016   Procedure: Fistulagram;  Surgeon: Nada Libman, MD;  Location: [facility] INVASIVE CV LAB;  Service: Cardiovascular;  Laterality: Left;  upper arm  . PERIPHERAL VASCULAR CATHETERIZATION N/A 03/25/2016   Procedure: venogram;  Surgeon: Fransisco Hertz, MD;  Location: [facility] INVASIVE CV LAB;  Service: Cardiovascular;  Laterality: N/A;  . RETINAL LASER PROCEDURE Right 04/2015  . SPINE SURGERY  2004   back fusion/ [facility] Penumalli,MD       Home Medications    Prior to Admission medications   Medication Sig Start Date End Date Taking? Authorizing Provider  amLODipine (NORVASC) 5 MG tablet Take 1 tablet (5 mg total) by mouth at bedtime. 02/26/16  Yes [name] L [name], DO    calcitRIOL (ROCALTROL) 0.5 MCG capsule Take 2 capsules (1 mcg total) by mouth every Monday, Wednesday, and Friday with hemodialysis. 02/26/16  Yes [name] L [name], DO  calcium acetate (PHOSLO) 667 MG capsule Take 3 capsules by mouth three times a day 02/26/16  Yes [name] L [name], DO  colchicine 0.6 MG tablet Take 0.6 mg by mouth daily as needed. gout 02/26/16  Yes Historical Provider, MD  divalproex (DEPAKOTE ER) 500 MG 24 hr tablet Take 1 tablet (500 mg total) by mouth daily. 03/21/16  Yes Inez Catalina, MD  fexofenadine (ALLEGRA) 180 MG tablet Take 1 tablet (180 mg total) by mouth daily as needed for allergies or rhinitis. 02/26/16  Yes [name] L [name], DO  folic acid (FOLVITE) 1 MG tablet Take 1 tablet (1 mg total) by mouth daily. 02/26/16  Yes [name] L [name], DO  gabapentin (NEURONTIN) 100 MG capsule Take 1 capsule (100 mg total) by mouth 3 (three) times daily. 02/26/16  Yes [name] L [name], DO  glucose blood (ACCU-CHEK AVIVA PLUS) test strip 1 each by Other route 3 (three) times daily. Use as instructed 01/21/16  Yes [name] L [name], DO  HUMALOG KWIKPEN 100 UNIT/ML KiwkPen Inject 0-5 Units into the skin 3 (three) times daily  with meals as needed. Per sliding scale 02/19/16  Yes Historical Provider, MD  Insulin Pen Needle 31G X 8 MM MISC Use as directed with insulin pens 03/01/16  Yes [name] L [name], DO  Lacosamide 150 MG TABS Take 1 tablet (150 mg total) by mouth 2 (two) times daily. 03/17/16  Yes Shanker Levora Dredge, MD  multivitamin (RENA-VIT) TABS tablet Take 1 tablet by mouth at bedtime. 02/26/16  Yes [name] L [name], DO  pantoprazole (PROTONIX) 40 MG tablet Take 1 tablet (40 mg total) by mouth 2 (two) times daily. 02/26/16  Yes [name] L [name], DO  sennosides-docusate sodium (SENOKOT-S) 8.6-50 MG tablet Take 1 tablet by mouth every evening. 02/26/16  Yes [name] L [name], DO  Nutritional Supplements (FEEDING SUPPLEMENT, NEPRO CARB STEADY,) LIQD Take 237 mLs by mouth 2 (two) times daily between meals. Patient not  taking: Reported on 04/06/2016 03/21/16   Inez Catalina, MD    Family History Family History  Problem Relation Age of Onset  . Sudden death Mother   . Sudden death Father     Social History Social History  Substance Use Topics  . Smoking status: Never Smoker  . Smokeless tobacco: Never Used  . Alcohol use No     Allergies   Aspirin and Lactose intolerance (gi)   Review of Systems Review of Systems  Unable to perform ROS: Mental status change     Physical Exam Updated Vital Signs BP 136/60 (BP Location: Left Arm)   Pulse 76   Temp 98.1 F (36.7 C) (Oral)   Resp 18   Wt 89.9 kg   SpO2 96%   BMI 26.89 kg/m   Physical Exam  Constitutional: He appears well-developed and well-nourished. No distress.  HENT:  Head: Normocephalic and atraumatic.  Eyes: Conjunctivae are normal. No scleral icterus.  Neck: Normal range of motion. Neck supple.  Cardiovascular: Normal rate, regular rhythm and normal heart sounds.   Pulmonary/Chest: Effort normal and breath sounds normal. No respiratory distress.  Abdominal: Soft. There is no tenderness.  Musculoskeletal: He exhibits no edema.  Neurological: He is alert.  Patient oriented to person and place only  Skin: Skin is warm and dry. Rash noted. He is not diaphoretic.  Vesicular eruption in the C3-C4 dermatome on the right.  Psychiatric: His behavior is normal.  Nursing note and vitals reviewed.    ED Treatments / Results  Labs (all labs ordered are listed, but only abnormal results are displayed) Labs Reviewed  COMPREHENSIVE METABOLIC PANEL - Abnormal; Notable for the following:       Result Value   Glucose, Bld 158 (*)    BUN 36 (*)    Creatinine, Ser 6.80 (*)    Albumin 3.1 (*)    GFR calc non Af Amer 7 (*)    GFR calc Af Amer 8 (*)    All other components within normal limits  URINALYSIS, ROUTINE W REFLEX MICROSCOPIC (NOT AT [facility]) - Abnormal; Notable for the following:    Glucose, UA 100 (*)    Hgb urine dipstick  TRACE (*)    Protein, ur 100 (*)    Leukocytes, UA TRACE (*)    All other components within normal limits  VALPROIC ACID LEVEL - Abnormal; Notable for the following:    Valproic Acid Lvl 16 (*)    All other components within normal limits  URINE MICROSCOPIC-ADD ON - Abnormal; Notable for the following:    Squamous Epithelial / LPF 0-5 (*)    All  other components within normal limits  CBC WITH DIFFERENTIAL/PLATELET - Abnormal; Notable for the following:    RBC 3.32 (*)    Hemoglobin 10.8 (*)    HCT 33.5 (*)    MCV 100.9 (*)    RDW 16.3 (*)    Platelets 56 (*)    All other components within normal limits  RENAL FUNCTION PANEL - Abnormal; Notable for the following:    Glucose, Bld 166 (*)    BUN 43 (*)    Creatinine, Ser 7.72 (*)    Albumin 2.8 (*)    GFR calc non Af Amer 6 (*)    GFR calc Af Amer 7 (*)    All other components within normal limits  GLUCOSE, CAPILLARY - Abnormal; Notable for the following:    Glucose-Capillary 218 (*)    All other components within normal limits  GLUCOSE, CAPILLARY - Abnormal; Notable for the following:    Glucose-Capillary 185 (*)    All other components within normal limits  GLUCOSE, CAPILLARY - Abnormal; Notable for the following:    Glucose-Capillary 186 (*)    All other components within normal limits  GLUCOSE, CAPILLARY - Abnormal; Notable for the following:    Glucose-Capillary 210 (*)    All other components within normal limits  CULTURE, BLOOD (ROUTINE X 2)  CULTURE, BLOOD (ROUTINE X 2)  URINE CULTURE  AMMONIA  PROTIME-INR  CBC WITH DIFFERENTIAL/PLATELET  VALPROIC ACID LEVEL  I-STAT CG4 LACTIC ACID, ED  I-STAT TROPOININ, ED  CBG MONITORING, ED    EKG  EKG Interpretation  Date/Time:  Tuesday April 06 2016 12:41:10 EDT Ventricular Rate:  80 PR Interval:    QRS Duration: 87 QT Interval:  416 QTC Calculation: 480 R Axis:   49 Text Interpretation:  Sinus rhythm Prolonged PR interval Borderline T abnormalities, lateral  leads Borderline prolonged QT interval No significant change since last tracing Confirmed by Anitra Lauth  MD, Alphonzo Lemmings ([id number]) on 04/06/2016 1:52:27 PM       Radiology Ct Head Wo Contrast  Result Date: 04/06/2016 CLINICAL DATA:  Increasing weakness over the past 2 days with confusion. Headache. EXAM: CT HEAD WITHOUT CONTRAST TECHNIQUE: Contiguous axial images were obtained from the base of the skull through the vertex without intravenous contrast. COMPARISON:  03/19/2016 FINDINGS: The ventricles and cisterns are within normal. There is mild age related atrophic change. There is moderate chronic ischemic microvascular disease. Bilateral basal ganglia calcifications are present. There is no mass, mass effect, shift of midline structures or acute hemorrhage. There is no evidence to suggest acute infarction. Minimal mucosal membrane thickening over the floor the right maxillary sinus. Remaining bony structures are within normal. IMPRESSION: No acute intracranial findings per Mild age related atrophy and moderate chronic ischemic microvascular disease. Subtle chronic sinus inflammatory change. Electronically Signed   By: Elberta Fortis M.D.   On: 04/06/2016 15:34   Dg Chest Portable 1 View  Result Date: 04/06/2016 CLINICAL DATA:  Altered mental status/confusion. End-stage renal disease EXAM: PORTABLE CHEST 1 VIEW COMPARISON:  March 19, 2016 FINDINGS: Lungs are clear. Heart size and pulmonary vascularity are normal. No adenopathy. There is atherosclerotic calcification in the aortic arch. Pacemaker leads are attached to the right atrium and right ventricle. Central catheter tip is in the superior vena cava. No pneumothorax. IMPRESSION: Lungs clear. Aortic atherosclerosis. Central catheter tip in superior vena cava. No pneumothorax. Electronically Signed   By: Bretta Bang III M.D.   On: 04/06/2016 13:25    Procedures Procedures (including critical  care time)  Medications Ordered in ED Medications  acyclovir  (ZOVIRAX) 450 mg in dextrose 5 % 100 mL IVPB (450 mg Intravenous New Bag/Given 04/06/16 1720)  feeding supplement (NEPRO CARB STEADY) liquid 237 mL (237 mLs Oral New Bag/Given 04/07/16 1400)  amLODipine (NORVASC) tablet 5 mg (5 mg Oral Given 04/06/16 2122)  calcitRIOL (ROCALTROL) capsule 1 mcg (1 mcg Oral Given 04/07/16 1138)  loratadine (CLARITIN) tablet 10 mg (10 mg Oral Given 04/07/16 0908)  gabapentin (NEURONTIN) capsule 100 mg (100 mg Oral Given 04/07/16 1526)  multivitamin (RENA-VIT) tablet 1 tablet (1 tablet Oral Given 04/06/16 2122)  sodium chloride flush (NS) 0.9 % injection 3 mL (3 mLs Intravenous Given 04/07/16 0912)  acetaminophen (TYLENOL) tablet 650 mg (not administered)    Or  acetaminophen (TYLENOL) suppository 650 mg (not administered)  insulin aspart (novoLOG) injection 0-9 Units (3 Units Subcutaneous Given 04/07/16 1653)  insulin aspart (novoLOG) injection 0-5 Units (2 Units Subcutaneous Given 04/06/16 2146)  lacosamide (VIMPAT) tablet 150 mg (150 mg Oral Given 04/07/16 0908)  calcium acetate (PHOSLO) capsule 2,001 mg (2,001 mg Oral Given 04/07/16 1138)  senna-docusate (Senokot-S) tablet 1 tablet (1 tablet Oral Given 04/06/16 2122)  divalproex (DEPAKOTE ER) 24 hr tablet 500 mg (500 mg Oral Given 04/07/16 0909)  folic acid (FOLVITE) tablet 1 mg (1 mg Oral Given 04/07/16 0908)  Darbepoetin Alfa (ARANESP) injection 150 mcg (not administered)    Patient with new diagnosis of herpes zoster. Has had a previous outbreak. His labs. To be at baseline. Patient will be admitted for encephalopathy.  Initial Impression / Assessment and Plan / ED Course  I have reviewed the triage vital signs and the nursing notes.  Pertinent labs & imaging results that were available during my care of the patient were reviewed by me and considered in my medical decision making (see chart for details).  Clinical Course  Comment By Time  Patient with multiple recent admissions for encephalopathy.  He has a New zoster outbreak  on the R shoulder (c-3,c-4) dermatome. Afebrile and HDS. His labs appear in to be at baseline. Imaging is negative. Arthor Captain, PA-C 08/01 1545      Final Clinical Impressions(s) / ED Diagnoses   Final diagnoses:  Encephalopathy  Herpes zoster    New Prescriptions Current Discharge Medication List       Arthor Captain, PA-C 04/07/16 1707    Gwyneth Sprout, MD 04/08/16 1024

## 2016-04-07 NOTE — Telephone Encounter (Signed)
This encounter was created in error - please disregard.

## 2016-04-07 NOTE — Consult Note (Signed)
[facility] Renal Consultation Note  Indication for Consultation:  Management of ESRD/hemodialysis; anemia, hypertension/volume and secondary hyperparathyroidism  HPI: [name] is a 81 y.o. male with ho ESRD on  Chronic HD since 2014 with ESRD  Sec DM /HTN  [facility] ) Pulm HTN ,,  Subdural hematoma, Seizure dz, OSA,, Dementia  Admitted to observation staus sec to Acute Encephalopathy possible seizure  With subtherapeutic depakote level . Also noted per Admit  Team Possible shingles with minimal lesions along R  Neck. We are consulted for HD today  And will do full Renal consult if changed to admit status . Last hd 04/05/16 uneventful .     Dialysis Orders: [facility] M[facility] . EDW 87.5 HD Bath 2k, 2.25ca  Time  3hr  Heparin none. Access R IJ Permcath   BFR 400 DFR 800     Mircera 150 q 2wks hd ( last given 03/31/16    Units IV/HD   Other recent op labs = hgb 10.3 ca core 10.4 phos 3.7 pth  264    Lenny Pastel, PA-C [facility] [place] [phone] 04/07/2016, 12:31 PM   Patient seen and examined, agree with above note with above modifications.  Annie Sable, MD 04/07/2016

## 2016-04-07 NOTE — Progress Notes (Addendum)
PROGRESS NOTE    Chief Walkup  PJK:[id number] DOB: 03-28-1932 DOA: 04/06/2016 PCP: [name], DO    Brief Narrative: Encephalopathy, 82 year old male with history of subdural hematoma, seizure, ESRD on HD MWF, DM and HTN amongst other medical problems. Recent diagnosis of seizure. Admitted for neurologic observation and workup.   Assessment & Plan:   Principal Problem:   Acute encephalopathy Active Problems:   Hypertension   Anemia in chronic kidney disease   ESRD on dialysis (HCC)   Seizure disorder (HCC)   Dementia   Chronic diastolic congestive heart failure (HCC)   Controlled type 2 diabetes mellitus with diabetic nephropathy, without long-term current use of insulin (HCC)   Thrombocytopenia (HCC)   Encephalopathy   Herpes zoster  1. Encephalopathy. Multifactorial, will continue neuro checks and aspiration precautions, low valproic acid level, possible recurrent seizure, will consult neurology in am for further recommendations, patient may need to be reloaded. Doubt zoster related encephalopathy. Continue lacosamide and depakote.   2. ESRD on HD. Will continue with nephrology recommendation for renal replacement therapy. Clinically patient with no volume overload or uremia.  3. HTN. Continue blood pressure control with amlodipine.   4. Zoster right neck base. Continue acyclivir for now, will change to po in am, no signs of bacterial over infection.   5. T2DM, Will continue glucose cover and monitor with insulin sliding scale, serum glucose [phone].  6, Dementia. Will continue neuro checks, no agitation, will consult palliative care per patient's family request.   DVT prophylaxis: SCD Code Status:  full Family Communication: I spoke with patient's son at the bedside. Disposition Plan: home   Consultants:     Procedures:   Antimicrobials:   Subjective: Patient with no chest pain or dyspnea, no nausea or vomiting. Positive disorientation but no agitation.  Mild somnolence.    Objective: Vitals:   04/07/16 0154 04/07/16 0602 04/07/16 0726 04/07/16 0950  BP: (!) 147/65 136/60  126/78  Pulse: 71 72  76  Resp: 18 18  18   Temp: 98.9 F (37.2 C) 99.2 F (37.3 C)  98.6 F (37 C)  TempSrc: Axillary Axillary  Oral  SpO2: 98% 98%  99%  Weight:   89.9 kg (198 lb 4.8 oz)    No intake or output data in the 24 hours ending 04/07/16 1238 Filed Weights   04/07/16 0726  Weight: 89.9 kg (198 lb 4.8 oz)    Examination:  General exam: deconditioned  E ENT: no significant conjunctival pallor, oral mucosa moist. Respiratory system: Decreased breath sounds at bases. Respiratory effort normal. No wheezing, rales or rhonchi.  Cardiovascular system: S1 & S2 heard, RRR. No JVD, murmurs, rubs, gallops or clicks. No pedal edema. Gastrointestinal system: Abdomen is nondistended, soft and nontender. No organomegaly or masses felt. Normal bowel sounds heard. Central nervous system: confused and disorientated. Extremities: Symmetric 5 x 5 power. Skin: No rashes, lesions or ulcers  Data Reviewed: I have personally reviewed following labs and imaging studies  CBC:  Recent Labs Lab 04/06/16 1611  WBC 4.0  NEUTROABS 2.0  HGB 10.8*  HCT 33.5*  MCV 100.9*  PLT 56*   Basic Metabolic Panel:  Recent Labs Lab 04/06/16 1439 04/07/16 0619  NA 137 137  K 4.5 4.3  CL 104 102  CO2 22 25  GLUCOSE 158* 166*  BUN 36* 43*  CREATININE 6.80* 7.72*  CALCIUM 9.5 9.4  PHOS  --  4.1   GFR: Estimated Creatinine Clearance: 8.1 mL/min (by C-G formula based  on SCr of 7.72 mg/dL). Liver Function Tests:  Recent Labs Lab 04/06/16 1439 04/07/16 0619  AST 41  --   ALT 26  --   ALKPHOS 56  --   BILITOT 0.6  --   PROT 6.7  --   ALBUMIN 3.1* 2.8*   No results for input(s): LIPASE, AMYLASE in the last 168 hours.  Recent Labs Lab 04/06/16 1440  AMMONIA 33   Coagulation Profile:  Recent Labs Lab 04/06/16 1439  INR 1.14   Cardiac Enzymes: No results  for input(s): CKTOTAL, CKMB, CKMBINDEX, TROPONINI in the last 168 hours. BNP (last 3 results) No results for input(s): PROBNP in the last 8760 hours. HbA1C: No results for input(s): HGBA1C in the last 72 hours. CBG:  Recent Labs Lab 04/06/16 2138 04/07/16 0640 04/07/16 1118  GLUCAP 218* 185* 186*   Lipid Profile: No results for input(s): CHOL, HDL, LDLCALC, TRIG, CHOLHDL, LDLDIRECT in the last 72 hours. Thyroid Function Tests: No results for input(s): TSH, T4TOTAL, FREET4, T3FREE, THYROIDAB in the last 72 hours. Anemia Panel: No results for input(s): VITAMINB12, FOLATE, FERRITIN, TIBC, IRON, RETICCTPCT in the last 72 hours. Sepsis Labs:  Recent Labs Lab 04/06/16 1439  LATICACIDVEN 1.53    No results found for this or any previous visit (from the past 240 hour(s)).       Radiology Studies: Ct Head Wo Contrast  Result Date: 04/06/2016 CLINICAL DATA:  Increasing weakness over the past 2 days with confusion. Headache. EXAM: CT HEAD WITHOUT CONTRAST TECHNIQUE: Contiguous axial images were obtained from the base of the skull through the vertex without intravenous contrast. COMPARISON:  03/19/2016 FINDINGS: The ventricles and cisterns are within normal. There is mild age related atrophic change. There is moderate chronic ischemic microvascular disease. Bilateral basal ganglia calcifications are present. There is no mass, mass effect, shift of midline structures or acute hemorrhage. There is no evidence to suggest acute infarction. Minimal mucosal membrane thickening over the floor the right maxillary sinus. Remaining bony structures are within normal. IMPRESSION: No acute intracranial findings per Mild age related atrophy and moderate chronic ischemic microvascular disease. Subtle chronic sinus inflammatory change. Electronically Signed   By: Elberta Fortis M.D.   On: 04/06/2016 15:34   Dg Chest Portable 1 View  Result Date: 04/06/2016 CLINICAL DATA:  Altered mental status/confusion.  End-stage renal disease EXAM: PORTABLE CHEST 1 VIEW COMPARISON:  March 19, 2016 FINDINGS: Lungs are clear. Heart size and pulmonary vascularity are normal. No adenopathy. There is atherosclerotic calcification in the aortic arch. Pacemaker leads are attached to the right atrium and right ventricle. Central catheter tip is in the superior vena cava. No pneumothorax. IMPRESSION: Lungs clear. Aortic atherosclerosis. Central catheter tip in superior vena cava. No pneumothorax. Electronically Signed   By: Bretta Bang III M.D.   On: 04/06/2016 13:25        Scheduled Meds: . acyclovir  5 mg/kg Intravenous Q24H  . amLODipine  5 mg Oral QHS  . calcitRIOL  1 mcg Oral Q M,W,F-HD  . calcium acetate  2,001 mg Oral TID WC  . divalproex  500 mg Oral Daily  . feeding supplement (NEPRO CARB STEADY)  237 mL Oral BID BM  . folic acid  1 mg Oral Daily  . gabapentin  100 mg Oral TID  . insulin aspart  0-5 Units Subcutaneous QHS  . insulin aspart  0-9 Units Subcutaneous TID WC  . lacosamide  150 mg Oral BID  . loratadine  10 mg Oral Daily  .  multivitamin  1 tablet Oral QHS  . senna-docusate  1 tablet Oral QHS  . sodium chloride flush  3 mL Intravenous Q12H   Continuous Infusions:    LOS: 0 days       Jeffory Snelgrove Annett Gula, MD [facility] Pager [phone]  If 7PM-7AM, please contact night-coverage [email or web address] Password [email or web address] 04/07/2016, 12:38 PM

## 2016-04-08 ENCOUNTER — Other Ambulatory Visit: Payer: Self-pay | Admitting: *Deleted

## 2016-04-08 DIAGNOSIS — Z7189 Other specified counseling: Secondary | ICD-10-CM | POA: Diagnosis not present

## 2016-04-08 DIAGNOSIS — Z992 Dependence on renal dialysis: Secondary | ICD-10-CM

## 2016-04-08 DIAGNOSIS — Z789 Other specified health status: Secondary | ICD-10-CM

## 2016-04-08 DIAGNOSIS — B029 Zoster without complications: Secondary | ICD-10-CM | POA: Diagnosis not present

## 2016-04-08 DIAGNOSIS — Z515 Encounter for palliative care: Secondary | ICD-10-CM

## 2016-04-08 DIAGNOSIS — G40909 Epilepsy, unspecified, not intractable, without status epilepticus: Secondary | ICD-10-CM

## 2016-04-08 DIAGNOSIS — N186 End stage renal disease: Secondary | ICD-10-CM | POA: Diagnosis not present

## 2016-04-08 DIAGNOSIS — G9340 Encephalopathy, unspecified: Secondary | ICD-10-CM | POA: Diagnosis not present

## 2016-04-08 DIAGNOSIS — F0390 Unspecified dementia without behavioral disturbance: Secondary | ICD-10-CM | POA: Diagnosis not present

## 2016-04-08 DIAGNOSIS — L8990 Pressure ulcer of unspecified site, unspecified stage: Secondary | ICD-10-CM | POA: Insufficient documentation

## 2016-04-08 LAB — GLUCOSE, CAPILLARY
GLUCOSE-CAPILLARY: 198 mg/dL — AB (ref 65–99)
Glucose-Capillary: 201 mg/dL — ABNORMAL HIGH (ref 65–99)
Glucose-Capillary: 195 mg/dL — ABNORMAL HIGH (ref 65–99)
Glucose-Capillary: 167 mg/dL — ABNORMAL HIGH (ref 65–99)

## 2016-04-08 LAB — CBC WITH DIFFERENTIAL/PLATELET
BASOS ABS: 0 10*3/uL (ref 0.0–0.1)
Basophils Relative: 0 %
EOS PCT: 1 %
Eosinophils Absolute: 0 10*3/uL (ref 0.0–0.7)
HEMATOCRIT: 33.5 % — AB (ref 39.0–52.0)
Hemoglobin: 10.6 g/dL — ABNORMAL LOW (ref 13.0–17.0)
LYMPHS ABS: 0.9 10*3/uL (ref 0.7–4.0)
LYMPHS PCT: 23 %
MCH: 31.8 pg (ref 26.0–34.0)
MCHC: 31.6 g/dL (ref 30.0–36.0)
MCV: 100.6 fL — AB (ref 78.0–100.0)
MONO ABS: 0.5 10*3/uL (ref 0.1–1.0)
MONOS PCT: 13 %
Neutro Abs: 2.3 10*3/uL (ref 1.7–7.7)
Neutrophils Relative %: 63 %
Platelets: 58 10*3/uL — ABNORMAL LOW (ref 150–400)
RBC: 3.33 MIL/uL — ABNORMAL LOW (ref 4.22–5.81)
RDW: 16.6 % — AB (ref 11.5–15.5)
WBC: 3.7 10*3/uL — ABNORMAL LOW (ref 4.0–10.5)

## 2016-04-08 LAB — BASIC METABOLIC PANEL
Anion gap: 10 (ref 5–15)
BUN: 27 mg/dL — ABNORMAL HIGH (ref 6–20)
CALCIUM: 9.2 mg/dL (ref 8.9–10.3)
CO2: 27 mmol/L (ref 22–32)
CREATININE: 6.35 mg/dL — AB (ref 0.61–1.24)
Chloride: 97 mmol/L — ABNORMAL LOW (ref 101–111)
GFR calc non Af Amer: 7 mL/min — ABNORMAL LOW (ref >60)
GFR, EST AFRICAN AMERICAN: 8 mL/min — AB (ref >60)
Glucose, Bld: 203 mg/dL — ABNORMAL HIGH (ref 65–99)
Potassium: 4 mmol/L (ref 3.5–5.1)
SODIUM: 134 mmol/L — AB (ref 135–145)

## 2016-04-08 LAB — URINE CULTURE: Culture: NO GROWTH

## 2016-04-08 LAB — MRSA PCR SCREENING: MRSA by PCR: NEGATIVE

## 2016-04-08 MED ORDER — GLYCOPYRROLATE 0.2 MG/ML IJ SOLN
0.2000 mg | INTRAMUSCULAR | Status: DC | PRN
  Filled 2016-04-08: qty 1

## 2016-04-08 MED ORDER — ACETAMINOPHEN 650 MG RE SUPP: 650.0000 mg | Freq: Four times a day (QID) | RECTAL | Status: DC | PRN

## 2016-04-08 MED ORDER — OXYCODONE HCL 5 MG/5ML PO SOLN: 5.0000 mg | ORAL | Status: DC | PRN

## 2016-04-08 MED ORDER — HALOPERIDOL 0.5 MG PO TABS
0.5000 mg | ORAL_TABLET | ORAL | Status: DC | PRN
  Filled 2016-04-08: qty 1

## 2016-04-08 MED ORDER — DIPHENHYDRAMINE HCL 50 MG/ML IJ SOLN: 12.5000 mg | INTRAMUSCULAR | Status: DC | PRN

## 2016-04-08 MED ORDER — SODIUM CHLORIDE 0.9 % IV SOLN: 250.0000 mL | INTRAVENOUS | Status: DC | PRN

## 2016-04-08 MED ORDER — PROCHLORPERAZINE EDISYLATE 5 MG/ML IJ SOLN: 10.0000 mg | Freq: Two times a day (BID) | INTRAMUSCULAR | Status: DC | PRN

## 2016-04-08 MED ORDER — SODIUM CHLORIDE 0.9% FLUSH: 3.0000 mL | INTRAVENOUS | Status: DC | PRN

## 2016-04-08 MED ORDER — HALOPERIDOL LACTATE 5 MG/ML IJ SOLN: 0.5000 mg | INTRAMUSCULAR | Status: DC | PRN

## 2016-04-08 MED ORDER — LORAZEPAM 1 MG PO TABS
1.0000 mg | ORAL_TABLET | ORAL | Status: DC | PRN
Start: 2016-04-08 — End: 2016-04-09

## 2016-04-08 MED ORDER — LORAZEPAM 2 MG/ML PO CONC: 1.0000 mg | ORAL | Status: DC | PRN

## 2016-04-08 MED ORDER — PROCHLORPERAZINE MALEATE 10 MG PO TABS
10.0000 mg | ORAL_TABLET | Freq: Four times a day (QID) | ORAL | Status: DC | PRN
  Filled 2016-04-08: qty 1

## 2016-04-08 MED ORDER — SODIUM CHLORIDE 0.9% FLUSH
3.0000 mL | Freq: Two times a day (BID) | INTRAVENOUS | Status: DC
  Administered 2016-04-08 – 2016-04-09 (×3): 3 mL via INTRAVENOUS

## 2016-04-08 MED ORDER — GLYCOPYRROLATE 1 MG PO TABS
1.0000 mg | ORAL_TABLET | ORAL | Status: DC | PRN
  Filled 2016-04-08: qty 1

## 2016-04-08 MED ORDER — LORAZEPAM 2 MG/ML IJ SOLN: 1.0000 mg | INTRAMUSCULAR | Status: DC | PRN

## 2016-04-08 MED ORDER — PROCHLORPERAZINE 25 MG RE SUPP
25.0000 mg | Freq: Two times a day (BID) | RECTAL | Status: DC | PRN
  Filled 2016-04-08: qty 1

## 2016-04-08 MED ORDER — VALPROATE SODIUM 500 MG/5ML IV SOLN
1000.0000 mg | Freq: Once | INTRAVENOUS | Status: AC
  Administered 2016-04-08: 1000 mg via INTRAVENOUS
  Filled 2016-04-08: qty 10

## 2016-04-08 MED ORDER — TRAZODONE HCL 50 MG PO TABS
25.0000 mg | ORAL_TABLET | Freq: Every evening | ORAL | Status: DC | PRN
Start: 2016-04-08 — End: 2016-04-09

## 2016-04-08 MED ORDER — DIVALPROEX SODIUM ER 500 MG PO TB24: 500.0000 mg | ORAL_TABLET | Freq: Every day | ORAL | Status: DC

## 2016-04-08 MED ORDER — POLYVINYL ALCOHOL 1.4 % OP SOLN: 1.0000 [drp] | Freq: Four times a day (QID) | OPHTHALMIC | Status: DC | PRN

## 2016-04-08 MED ORDER — OXYCODONE HCL 20 MG/ML PO CONC: 5.0000 mg | ORAL | Status: DC | PRN

## 2016-04-08 MED ORDER — FENTANYL CITRATE (PF) 100 MCG/2ML IJ SOLN: 25.0000 ug | INTRAMUSCULAR | Status: DC | PRN

## 2016-04-08 MED ORDER — HALOPERIDOL LACTATE 2 MG/ML PO CONC
0.5000 mg | ORAL | Status: DC | PRN
  Filled 2016-04-08: qty 0.3

## 2016-04-08 MED ORDER — ACETAMINOPHEN 325 MG PO TABS
650.0000 mg | ORAL_TABLET | Freq: Four times a day (QID) | ORAL | Status: DC | PRN
  Administered 2016-04-08 (×2): 650 mg via ORAL
  Filled 2016-04-08 (×2): qty 2

## 2016-04-08 NOTE — Consult Note (Signed)
Consultation Note Date: 04/08/2016   Patient Name: [name]  DOB: March 29, 1932  MRN: [id number]  Age / Sex: 82 y.o., male  PCP: Gayland Curry, DO Referring Physician: Tawni Millers, *  Reason for Consultation: Disposition, Establishing goals of care, Hospice Evaluation, Terminal Care and Withdrawal of life-sustaining treatment  HPI/Patient Profile: 82 y.o. male  with past medical history of end stage renal disease on dialysis, diabetes, pulmonary hypertension, subdural hematoma, seizure, dementia admitted on 04/06/2016 with altered mental status. Presumed to be result of subtherapeutic depakote vs. Infection. He also has shingles.  Clinical Assessment and Goals of Care: Patient has been hospitalized 7 times in the last 6 months. I met with son, Karn Pickler, who is also HCPOA. [name] notes significant declines in functioning over the last several months. Patient has been living at home with his wife, who also has dementia. Patient is currently under the care of [facility]. States patient "is not himself, has lost his spark." Mental status has deteriorated to the point that his son has taken responsibility for him and his mother to the point that he has been missing work to care for his parents. Son notes his Dad has no more quality of life and would not want to be constantly in and out of the hospital. When his Dad had mental clarity, a hospice nurse discussed what death would look like after stopping dialysis with Hospice assistance and patient stated, "that sounds nice." Karn Pickler is clear that he would like to stop his Dad's dialysis, and transition to comfort care only. We discussed trajectory and prognosis <10 days once dialysis is stopped and [name] understood. He would like his Dad transferred to a Hospice facility at discharge.   HCPOA- son [name]    SUMMARY OF  RECOMMENDATIONS    -CODE STATUS- DNR -Stop dialysis after one more treatment- Karn Pickler is going to inform patient's wife and his sister, then would like to proceed with stopping. If possible he would like one more treatment on Friday  -Transition to comfort care when dialysis is stopped  Karn Pickler will call me when he has talked to sister and his Mom and I will contact Nephrology and attending physician  Code Status/Advance Care Planning:  DNR    Symptom Management:   Terminal symptom management and comfort care will be ordered when dialysis is stopped  Palliative Prophylaxis:   Aspiration, Delirium Protocol, Frequent Pain Assessment and Turn Reposition  Additional Recommendations (Limitations, Scope, Preferences):  Avoid Hospitalization, Full Comfort Care, Minimize Medications, Initiate Comfort Feeding, No Artificial Feeding, No Blood Transfusions, No Chemotherapy, No Diagnostics, No Glucose Monitoring, No Hemodialysis, No IV Antibiotics, No IV Fluids, No Lab Draws, No Radiation, No Surgical Procedures and No Tracheostomy  Psycho-social/Spiritual:   Desire for further Chaplaincy support: No  Additional Recommendations: Caregiving  Support/Resources  Prognosis:   < 2 weeks d/t ESRD with stopping dialysis and other comorbidities  Discharge Planning: Hospice facility      Primary Diagnoses: Present on Admission: .  Encephalopathy . Acute encephalopathy . Anemia in chronic kidney disease . Hypertension . Chronic diastolic congestive heart failure ([place]) . Controlled type 2 diabetes mellitus with diabetic nephropathy, without long-term current use of insulin ([place]) . Herpes zoster . Dementia . Thrombocytopenia ([place])   I have reviewed the medical record, interviewed the patient and family, and examined the patient. The following aspects are pertinent.  Past Medical History:  Diagnosis Date  . Arthritis   . Diabetes mellitus without complication ([place])   . ESRD (end stage  renal disease) on dialysis [facility])    "MWF" (03/04/2016)  . GI bleed   . History of blood transfusion   . Hypertension   . Pulmonary hypertension ([place])    severe pulmonary HTN by 01/14/13 echo  . Seizure ([place])   . Sleep apnea    wears CPAP n/c  . Subdural hematoma ([place]) 05/2015   from fall   Social History   Social History  . Marital status: Married    Spouse name: patricia  . Number of children: 2  . Years of education: PHD   Occupational History  . Retired    Social History Main Topics  . Smoking status: Never Smoker  . Smokeless tobacco: Never Used  . Alcohol use No  . Drug use: No  . Sexual activity: No   Other Topics Concern  . None   Social History Narrative   Patient currently in [facility] rehab.   Caffeine Use: 1-2 cups occasionally   Family History  Problem Relation Age of Onset  . Sudden death Mother   . Sudden death Father    Scheduled Meds: . acyclovir  5 mg/kg Intravenous Q24H  . amLODipine  5 mg Oral QHS  . calcitRIOL  1 mcg Oral Q M,W,F-HD  . calcium acetate  2,001 mg Oral TID WC  . [START ON 04/14/2016] darbepoetin (ARANESP) injection - DIALYSIS  150 mcg Intravenous Q Wed-HD  . divalproex  500 mg Oral Daily  . feeding supplement (NEPRO CARB STEADY)  237 mL Oral BID BM  . folic acid  1 mg Oral Daily  . gabapentin  100 mg Oral TID  . insulin aspart  0-5 Units Subcutaneous QHS  . insulin aspart  0-9 Units Subcutaneous TID WC  . lacosamide  150 mg Oral BID  . loratadine  10 mg Oral Daily  . multivitamin  1 tablet Oral QHS  . senna-docusate  1 tablet Oral QHS  . sodium chloride flush  3 mL Intravenous Q12H   Continuous Infusions:  PRN Meds:.sodium chloride, sodium chloride, acetaminophen **OR** acetaminophen, alteplase, heparin, lidocaine (PF), lidocaine-prilocaine, pentafluoroprop-tetrafluoroeth Medications Prior to Admission:  Prior to Admission medications   Medication Sig Start Date End Date Taking? Authorizing Provider    amLODipine (NORVASC) 5 MG tablet Take 1 tablet (5 mg total) by mouth at bedtime. 02/26/16  Yes [name] L [name], DO  calcitRIOL (ROCALTROL) 0.5 MCG capsule Take 2 capsules (1 mcg total) by mouth every Monday, Wednesday, and Friday with hemodialysis. 02/26/16  Yes [name] L [name], DO  calcium acetate (PHOSLO) 667 MG capsule Take 3 capsules by mouth three times a day 02/26/16  Yes [name] L [name], DO  colchicine 0.6 MG tablet Take 0.6 mg by mouth daily as needed. gout 02/26/16  Yes Historical Provider, MD  divalproex (DEPAKOTE ER) 500 MG 24 hr tablet Take 1 tablet (500 mg total) by mouth daily. 03/21/16  Yes Sid Falcon, MD  fexofenadine (ALLEGRA) 180 MG tablet Take 1  tablet (180 mg total) by mouth daily as needed for allergies or rhinitis. 02/26/16  Yes [name] L [name], DO  folic acid (FOLVITE) 1 MG tablet Take 1 tablet (1 mg total) by mouth daily. 02/26/16  Yes [name] L [name], DO  gabapentin (NEURONTIN) 100 MG capsule Take 1 capsule (100 mg total) by mouth 3 (three) times daily. 02/26/16  Yes [name] L [name], DO  glucose blood (ACCU-CHEK AVIVA PLUS) test strip 1 each by Other route 3 (three) times daily. Use as instructed 01/21/16  Yes [name] L [name], DO  HUMALOG KWIKPEN 100 UNIT/ML KiwkPen Inject 0-5 Units into the skin 3 (three) times daily with meals as needed. Per sliding scale 02/19/16  Yes Historical Provider, MD  Insulin Pen Needle 31G X 8 MM MISC Use as directed with insulin pens 03/01/16  Yes [name] L [name], DO  Lacosamide 150 MG TABS Take 1 tablet (150 mg total) by mouth 2 (two) times daily. 03/17/16  Yes Shanker Kristeen Mans, MD  multivitamin (RENA-VIT) TABS tablet Take 1 tablet by mouth at bedtime. 02/26/16  Yes [name] L [name], DO  pantoprazole (PROTONIX) 40 MG tablet Take 1 tablet (40 mg total) by mouth 2 (two) times daily. 02/26/16  Yes [name] L [name], DO  sennosides-docusate sodium (SENOKOT-S) 8.6-50 MG tablet Take 1 tablet by mouth every evening. 02/26/16  Yes [name] L [name], DO  Nutritional Supplements  (FEEDING SUPPLEMENT, NEPRO CARB STEADY,) LIQD Take 237 mLs by mouth 2 (two) times daily between meals. Patient not taking: Reported on 04/06/2016 03/21/16   Sid Falcon, MD   Allergies  Allergen Reactions  . Aspirin Other (See Comments)    Bleeding   . Lactose Intolerance (Gi) Diarrhea   Review of Systems  Unable to perform ROS: Mental status change    Physical Exam  Constitutional: No distress.  Cardiovascular: Normal rate and regular rhythm.   Pulmonary/Chest: Effort normal.  Diminished throughout  Abdominal: Soft. Bowel sounds are normal.  Neurological:  Unable to arouse, mumbled response to attempts to arouse, did not open eyes  Skin:  Eschar on R heel  Nursing note and vitals reviewed.   Vital Signs: BP 131/64 (BP Location: Left Arm)   Pulse 83   Temp 99.3 F (37.4 C) (Axillary)   Resp 16   Wt 86.6 kg (191 lb)   SpO2 99%   BMI 25.90 kg/m  Pain Assessment: No/denies pain POSS *See Group Information*: S-Acceptable,Sleep, easy to arouse Pain Score: Asleep   SpO2: SpO2: 99 % O2 Device:SpO2: 99 % O2 Flow Rate: .   IO: Intake/output summary:   Intake/Output Summary (Last 24 hours) at 04/08/16 0957 Last data filed at 04/07/16 2040  Gross per 24 hour  Intake              480 ml  Output             1500 ml  Net            -1020 ml    LBM: Last BM Date: 04/07/16 Baseline Weight: Weight: 89.9 kg (198 lb 4.8 oz) Most recent weight: Weight: 86.6 kg (191 lb)     Palliative Assessment/Data:  PPS: 10%   Flowsheet Rows   Flowsheet Row Most Recent Value  Intake Tab  Referral Department  Hospitalist  Unit at Time of Referral  Med/Surg Unit  Palliative Care Primary Diagnosis  Nephrology  Date Notified  04/07/16  Palliative Care Type  New Palliative care  Reason for referral  Clarify Goals  of Care, End of Life Care Assistance  Date of Admission  04/06/16  Date first seen by Palliative Care  04/08/16  # of days Palliative referral response time  1 Day(s)  #  of days IP prior to Palliative referral  1  Clinical Assessment  Palliative Performance Scale Score  10%  Pain Max last 24 hours  Not able to report  Pain Min Last 24 hours  Not able to report  Psychosocial & Spiritual Assessment  Palliative Care Outcomes  Patient/Family meeting held?  Yes  Who was at the meeting?  Son/HCPOA- [name] Sasaki  Palliative Care Outcomes  Clarified goals of care, Changed CPR status  Patient/Family wishes: Interventions discontinued/not started   Mechanical Ventilation, BiPAP, Hemodialysis, Transfusion, Vasopressors, PEG, Trach, NIPPV, Tube feedings/TPN, Antibiotics  Palliative Care follow-up planned  Yes, Facility      Time In:0900 Time Out:0950 Time Total: 50 minutes  Thank you for this consult, palliative medicine will continue to follow and assist with end of life care and disposition.   Greater than 50%  of this time was spent counseling and coordinating care related to the above assessment and plan.  Signed by:  Mariana Kaufman, AGNP-C   Please contact Palliative Medicine Team phone at [phone] for questions and concerns.  For individual provider: See Amion    Addendum: I spoke with Karn Pickler and family is ready to transition to comfort care today. No more dialysis treatments.  Would like transfer to residential Hospice when bed available. I notified attending physician and will place consult for Hospice evaluation.

## 2016-04-08 NOTE — Progress Notes (Signed)
Paged Dr. Ella Jubilee, patient ate little applesauce and is pocketing solids.  Spoke with pharmacy unable to crush vimpat or depakote ER. Did Dr. Ella Jubilee want to change route.

## 2016-04-08 NOTE — Progress Notes (Signed)
[facility]- [facility] RN Note for [employer]   Received request from CSW Charlynn Court of family interest in [facility].    Unfortunately there is not any bed availability today, however [facility] Him will follow up with this patient again  tomorrow to determine availability.  I have also spoken with the son [name] and he is aware.   Thank you for this referral!   Lavone Neri, RN [facility] Liaison  [phone]

## 2016-04-08 NOTE — Progress Notes (Signed)
Transfer Note:  Arrival Method: Via Doctor, general practice with nurse Tech Mental Orientation: Alert to self Telemetry: box 18, CCMD notified Assessment: Completed Skin: rash on right lower neck, eschar on right heel, refused further skin assessment GB:TDVV hand, saline locked Pain: denies pain [address] [place] Orientation: Patient has been orientated to the room, unit and the staff. Family: none at bedside  Orders have been reviewed and implemented. Will continue to monitor the patient. Call light has been placed within reach and bed alarm has been activated.   Tempie Donning BSN, RN  Phone Number: [phone] [facility] [place] Med/Surg-Renal Unit

## 2016-04-08 NOTE — Progress Notes (Signed)
Pt oral temp 102.9, gave tylenol 650 mg po crushed.  Paged Dr. Lenore Manner

## 2016-04-08 NOTE — Clinical Social Work Note (Signed)
[facility] does not have any beds today. [name] Breech will get in touch with 6E CSW tomorrow regarding placement. If not, CSW will contact [facility].  Charlynn Court, CSW [phone]

## 2016-04-08 NOTE — Patient Outreach (Signed)
[name]) Care Management  04/08/2016  [name] 18-May-1932 [id number]   CSW received a message from [facility], [name] with [facility], indicating that patient has now elected [facility].  [name], Aries Townley has met with Mariana Kaufman, Representative with Hospice, and has decided to proceed with comfort measures and transition patient to the [place], [employer].  Patient has declined any further dialysis treatments.   CSW will perform a case closure on patient, as all goals of treatment have been met from social work standpoint and no additional social work needs have been identified at this time.  CSW will notify [name] with [place] [facility], Valente David of CSW's plans to close patient's case.  CSW will fax an update to patient's Primary Care Physician, Dr.  Hollace Kinnier to ensure that they are aware of CSW's involvement with patient's plan of care.  CSW will submit a case closure request to Verlon Setting, Care Management Assistant with [facility] Management, in the form of an [employer].  CSW will ensure that Mrs. Comer is aware of Roma Schanz, RNCM with [facility], continued involvement with patient's care. Nat Christen, BSW, MSW, LCSW  Licensed Education officer, [facility]  [employer] [address], [place], [place] [address] Physical Address-[address], [place], [place] [address] Toll Free Main # [phone] Fax # [phone] Cell # [phone]  Fax # [phone]  [email or web address]

## 2016-04-08 NOTE — Progress Notes (Signed)
PROGRESS NOTE    [name]  SEG:[id number] DOB: 29-Oct-1931 DOA: 04/06/2016 PCP: [name], DO   Brief Narrative:  Encephalopathy, 82 year old male with history of subdural hematoma, seizure, ESRD on HD MWF, DM and HTN amongst other medical problems. Recent diagnosis of seizure. Admitted for neurologic observation and workup.   Assessment & Plan:   Principal Problem:   Acute encephalopathy Active Problems:   Hypertension   Anemia in chronic kidney disease   ESRD on dialysis (HCC)   Seizure disorder (HCC)   Dementia   Chronic diastolic congestive heart failure (HCC)   Controlled type 2 diabetes mellitus with diabetic nephropathy, without long-term current use of insulin (HCC)   Thrombocytopenia (HCC)   Encephalopathy   Herpes zoster   Pressure ulcer   Dialysis patient [facility])   Complex care coordination   Palliative care by specialist   Advance care planning  1. Encephalopathy. Multifactorial, will continue neuro checks and aspiration precautions, low valproic acid level, possible recurrent seizure, case discussed with neurology, will reload with valproic acid 1000 mg and follow levels in am. Patient more sedated today, will need to give valproate IV, unable to give lacosamide.  2. ESRD on HD.  Patient had hemodialysis yesterday. Per patient's family patient requesting to change patient comfort measures, will have family meeting in am for further discussion.   3. HTN. Blood pressure 117 to 160 systolic, Continue blood pressure control with amlodipine. Today unable to get po medications due to sedation.   4. Zoster right neck base. Continue acyclivir for now, clinically not worsening and no signs of bacterial coinfeciton.  5. T2DM, Will continue glucose cover and monitor with insulin sliding scale, serum glucose 190-201-195.  6, Dementia. Will continue neuro checks, no agitation, consulted palliative care per patient's family request. Recommendations for hospice and  palliative care.    DVT prophylaxis: SCD Code Status:  full Family Communication: I spoke with patient's wife at the bedside. Disposition Plan: home    Consultants:   Nephrology  Procedures:   Antimicrobials:    Subjective: Patient has been very somnolent today, not able to eat or take his medications po. No nausea or vomiting. No clonus. All information from nursing and patient's family at the bedside.   Objective: Vitals:   04/07/16 2203 04/08/16 0109 04/08/16 0541 04/08/16 1000  BP: 127/64 (!) 114/55 131/64 (!) 160/68  Pulse: 78 84 83 87  Resp: 16 16 16 18   Temp: 99.2 F (37.3 C) 99.1 F (37.3 C) 99.3 F (37.4 C) 98.8 F (37.1 C)  TempSrc:  Oral Axillary Oral  SpO2: 97% 99% 99% 98%  Weight:   86.6 kg (191 lb)     Intake/Output Summary (Last 24 hours) at 04/08/16 1452 Last data filed at 04/08/16 1300  Gross per 24 hour  Intake              580 ml  Output             1750 ml  Net            -1170 ml   Filed Weights   04/07/16 1645 04/07/16 2040 04/08/16 0541  Weight: 90.4 kg (199 lb 4.7 oz) 89 kg (196 lb 3.4 oz) 86.6 kg (191 lb)    Examination:  General exam: Appears calm and comfortable  Respiratory system: Clear to auscultation. Respiratory effort normal. Cardiovascular system: S1 & S2 heard, RRR. No JVD, murmurs, rubs, gallops or clicks. No pedal edema. Gastrointestinal system: Abdomen is nondistended,  soft and nontender. No organomegaly or masses felt. Normal bowel sounds heard. Central nervous system: Alert and oriented. No focal neurological deficits. Extremities: Symmetric 5 x 5 power. Skin: No rashes, lesions or ulcers Psychiatry: Judgement and insight appear normal. Mood & affect appropriate.     Data Reviewed: I have personally reviewed following labs and imaging studies  CBC:  Recent Labs Lab 04/06/16 1611 04/08/16 0807  WBC 4.0 3.7*  NEUTROABS 2.0 2.3  HGB 10.8* 10.6*  HCT 33.5* 33.5*  MCV 100.9* 100.6*  PLT 56* 58*   Basic  Metabolic Panel:  Recent Labs Lab 04/06/16 1439 04/07/16 0619 04/08/16 0807  NA 137 137 134*  K 4.5 4.3 4.0  CL 104 102 97*  CO2 22 25 27   GLUCOSE 158* 166* 203*  BUN 36* 43* 27*  CREATININE 6.80* 7.72* 6.35*  CALCIUM 9.5 9.4 9.2  PHOS  --  4.1  --    GFR: Estimated Creatinine Clearance: 9.8 mL/min (by C-G formula based on SCr of 6.35 mg/dL). Liver Function Tests:  Recent Labs Lab 04/06/16 1439 04/07/16 0619  AST 41  --   ALT 26  --   ALKPHOS 56  --   BILITOT 0.6  --   PROT 6.7  --   ALBUMIN 3.1* 2.8*   No results for input(s): LIPASE, AMYLASE in the last 168 hours.  Recent Labs Lab 04/06/16 1440  AMMONIA 33   Coagulation Profile:  Recent Labs Lab 04/06/16 1439  INR 1.14   Cardiac Enzymes: No results for input(s): CKTOTAL, CKMB, CKMBINDEX, TROPONINI in the last 168 hours. BNP (last 3 results) No results for input(s): PROBNP in the last 8760 hours. HbA1C: No results for input(s): HGBA1C in the last 72 hours. CBG:  Recent Labs Lab 04/07/16 1118 04/07/16 1624 04/07/16 2153 04/08/16 0741 04/08/16 1150  GLUCAP 186* 210* 127* 198* 201*   Lipid Profile: No results for input(s): CHOL, HDL, LDLCALC, TRIG, CHOLHDL, LDLDIRECT in the last 72 hours. Thyroid Function Tests: No results for input(s): TSH, T4TOTAL, FREET4, T3FREE, THYROIDAB in the last 72 hours. Anemia Panel: No results for input(s): VITAMINB12, FOLATE, FERRITIN, TIBC, IRON, RETICCTPCT in the last 72 hours. Sepsis Labs:  Recent Labs Lab 04/06/16 1439  LATICACIDVEN 1.53    Recent Results (from the past 240 hour(s))  Blood culture (routine x 2)     Status: None (Preliminary result)   Collection Time: 04/06/16  2:25 PM  Result Value Ref Range Status   Specimen Description BLOOD RIGHT HAND  Final   Special Requests IN PEDIATRIC BOTTLE  1CC  Final   Culture NO GROWTH 2 DAYS  Final   Report Status PENDING  Incomplete  Blood culture (routine x 2)     Status: None (Preliminary result)    Collection Time: 04/06/16  2:35 PM  Result Value Ref Range Status   Specimen Description BLOOD RIGHT ARM  Final   Special Requests IN PEDIATRIC BOTTLE  1CC  Final   Culture NO GROWTH 2 DAYS  Final   Report Status PENDING  Incomplete  Urine culture     Status: None   Collection Time: 04/06/16  3:50 PM  Result Value Ref Range Status   Specimen Description URINE, CATHETERIZED  Final   Special Requests NONE  Final   Culture NO GROWTH  Final   Report Status 04/08/2016 FINAL  Final  MRSA PCR Screening     Status: None   Collection Time: 04/08/16  6:15 AM  Result Value Ref Range Status  MRSA by PCR NEGATIVE NEGATIVE Final    Comment:        The GeneXpert MRSA Assay (FDA approved for NASAL specimens only), is one component of a comprehensive MRSA colonization surveillance program. It is not intended to diagnose MRSA infection nor to guide or monitor treatment for MRSA infections.          Radiology Studies: Ct Head Wo Contrast  Result Date: 04/06/2016 CLINICAL DATA:  Increasing weakness over the past 2 days with confusion. Headache. EXAM: CT HEAD WITHOUT CONTRAST TECHNIQUE: Contiguous axial images were obtained from the base of the skull through the vertex without intravenous contrast. COMPARISON:  03/19/2016 FINDINGS: The ventricles and cisterns are within normal. There is mild age related atrophic change. There is moderate chronic ischemic microvascular disease. Bilateral basal ganglia calcifications are present. There is no mass, mass effect, shift of midline structures or acute hemorrhage. There is no evidence to suggest acute infarction. Minimal mucosal membrane thickening over the floor the right maxillary sinus. Remaining bony structures are within normal. IMPRESSION: No acute intracranial findings per Mild age related atrophy and moderate chronic ischemic microvascular disease. Subtle chronic sinus inflammatory change. Electronically Signed   By: Elberta Fortis M.D.   On:  04/06/2016 15:34        Scheduled Meds: . acyclovir  5 mg/kg Intravenous Q24H  . amLODipine  5 mg Oral QHS  . gabapentin  100 mg Oral TID  . lacosamide  150 mg Oral BID  . sodium chloride flush  3 mL Intravenous Q12H  . sodium chloride flush  3 mL Intravenous Q12H  . valproate sodium  1,000 mg Intravenous Once   Continuous Infusions:    LOS: 0 days        Mauricio Annett Gula, MD [facility] Pager [phone]  If 7PM-7AM, please contact night-coverage [email or web address] Password [email or web address] 04/08/2016, 2:52 PM

## 2016-04-08 NOTE — Clinical Social Work Note (Signed)
Clinical Social Work Assessment  Patient Details  Name: [name] MRN: [id number] Date of Birth: [birth year]  Date of referral:  04/08/16               Reason for consult:  End of Life/Hospice, Discharge Planning, Facility Placement                Permission sought to share information with:  Facility Medical sales representative, Family Supports Permission granted to share information::  Yes, Verbal Permission Granted  Name::     Optometrist  Agency::  [facility], [facility]  Relationship::  Son  Contact Information:  [phone]  Housing/Transportation Living arrangements for the past 2 months:  Single Family Home Source of Information:  Medical Team, Adult Children Patient Interpreter Needed:  None Criminal Activity/Legal Involvement Pertinent to Current Situation/Hospitalization:  No - Comment as needed Significant Relationships:  Adult Children, Spouse Lives with:  Spouse Do you feel safe going back to the place where you live?  Yes Need for family participation in patient care:  Yes (Comment)  Care giving concerns:  Patient in need of residential hospice services.   Social Worker assessment / plan:  Patient not fully oriented. CSW called patient's son. CSW introduced role. [name] confirmed interest in [facility]. He is willing to have patient go to [facility] if bed not available at [facility] today or tomorrow. Referral made to [name] Breech at [facility]. She thinks facility might be full right now but will look into it. No further concerns. CSW encouraged patient's son to contact CSW as needed. CSW will continue to follow patient and his family for support and facilitate discharge to residential hospice at either [facility] or [facility] when bed available.  Employment status:  Retired Database administrator PT Recommendations:  Not assessed at this time Information / Referral to community resources:  Other  (Comment Required) (Residential Hospice)  Patient/Family's Response to care:  Patient not fully oriented. [name] agreeable to residential hospice. Patient's family supportive and involved in patient's care. [name] appreciate social work intervention.  Patient/Family's Understanding of and Emotional Response to Diagnosis, Current Treatment, and Prognosis:  Patient not fully oriented. [name], after speaking with palliative care, understands need for residential hospice. [name] appears happy with hospital care.  Emotional Assessment Appearance:  Appears stated age Attitude/Demeanor/Rapport:  Unable to Assess Affect (typically observed):  Unable to Assess Orientation:  Oriented to Self Alcohol / Substance use:  Never Used Psych involvement (Current and /or in the community):  No (Comment)  Discharge Needs  Concerns to be addressed:  Other (Comment Required (End of life) Readmission within the last 30 days:  Yes Current discharge risk:  Cognitively Impaired, Dependent with Mobility, Terminally ill Barriers to Discharge:  Other (Hospice bed might not be available at preferred facility)   Margarito Liner, LCSW 04/08/2016, 1:28 PM

## 2016-04-08 NOTE — Progress Notes (Signed)
I just spoke with Marthann Schiller, patient's son. They would like to proceed with comfort measures and transition patient to residential hospice facility. They do not want anymore dialysis treatments.   Ocie Bob, AGNP-C Palliative Medicine  [phone]

## 2016-04-08 NOTE — Progress Notes (Signed)
Paged Dr. Ella Jubilee, pt has been sleeping this am, not eating or drinking at this time.  Palliative care consulted this am.  Paged to advise condition, FYI.  Pt unable to take medications at this time.

## 2016-04-08 NOTE — Progress Notes (Signed)
Patient follows commands, nods appropriately, does not verbalize at this time.  VSS.  Denies with nod pain

## 2016-04-09 ENCOUNTER — Other Ambulatory Visit: Payer: Self-pay | Admitting: *Deleted

## 2016-04-09 ENCOUNTER — Encounter: Payer: Self-pay | Admitting: *Deleted

## 2016-04-09 ENCOUNTER — Ambulatory Visit: Payer: Self-pay | Admitting: *Deleted

## 2016-04-09 DIAGNOSIS — F0390 Unspecified dementia without behavioral disturbance: Secondary | ICD-10-CM | POA: Diagnosis not present

## 2016-04-09 DIAGNOSIS — B999 Unspecified infectious disease: Secondary | ICD-10-CM | POA: Diagnosis not present

## 2016-04-09 DIAGNOSIS — G9340 Encephalopathy, unspecified: Secondary | ICD-10-CM | POA: Diagnosis not present

## 2016-04-09 DIAGNOSIS — R5381 Other malaise: Secondary | ICD-10-CM | POA: Diagnosis not present

## 2016-04-09 DIAGNOSIS — Z992 Dependence on renal dialysis: Secondary | ICD-10-CM | POA: Diagnosis not present

## 2016-04-09 LAB — GLUCOSE, CAPILLARY: Glucose-Capillary: 180 mg/dL — ABNORMAL HIGH (ref 65–99)

## 2016-04-09 NOTE — Patient Outreach (Signed)
[facility]) Care Management  04/09/2016  Diana Armijo 1931-12-20 [id number]  Member currently inpatient, noted that he will stop dialysis and be discharged to hospice facility for comfort care.  Hospital liaison, V. Brewer, confirmed.  Will close case and notify care management assistant and MD.  [name], RN, MSN [facility] Care Management  [facility] Manager [phone]

## 2016-04-09 NOTE — Discharge Summary (Signed)
Physician Discharge Summary  [name] EAV:[id number] DOB: 01-May-1932 DOA: 04/06/2016  PCP: [name], DO  Admit date: 04/06/2016 Discharge date: 04/09/2016  Admitted From: home Disposition: hospice  Recommendations for Outpatient Follow-up:  1. Follow up with PCP in 1-2 weeks   Home Health: no Equipment/Devices:  Discharge Condition: stable CODE STATUS: dnr Diet recommendation: regualar  Brief/Interim Summary: This is a 82- year-old gentleman who presented to hospital with a chief complaint of acute change in mental status. Patient has had frequent hospitalizations. At home patient developed progressive weakness, the day of admission he became unresponsive. No loss of bladder or bowel incontinence. On initial physical examination blood pressure was 164/46, heart rate 72, temperature 99.6, oxygen saturation 97%. His mucous membranes were moist, neck was supple, he had vesicular rash on the right base of the neck, his lungs were clear to auscultation bilaterally, no wheezes rales rhonchi, heart S1-S2 present rhythmic, abdomen soft nontender, lower extremity no edema, patient was able to respond to commands, strength was 5 out of 5 in all 4 extremities and his cranial nerves were grossly intact 2-12. CT was negative for acute ischemic changes. His white count was 4.0 with hemoglobin of 10.0, sodium 137 potassium 4.3.  Patient was admitted to hospital with working diagnosis of acute encephalopathy possibly metabolic rule out infectious rule out seizure related.   1. Metabolic encephalopathy. Patient mentation remained stable, most of the time he remained somnolent. Infection causes were ruled out. It is possible that this is part of his worsening dementia.  2. Seizures. Patient was continued on valproic acid and Vimpat, patient. Intake was very poor and he was not able to get his by mouth medications appropriately. Valproate was changed to to IV formulation, level was 16 on admission repeat  August 2 was 30. This very high suspicion the patient has been noncompliant with this medications.  3. End stage renal disease. Patient was dialyzed per nephrology recommendations..  Due to patient's declining his overall health patient's family requested patch care consultation, patient has been discharge to hospice. Spoke with patient's wife and daughter at the bedside and all questions were addressed.     Discharge Diagnoses:  Principal Problem:   Acute encephalopathy Active Problems:   Hypertension   Anemia in chronic kidney disease   ESRD on dialysis (HCC)   Seizure disorder (HCC)   Dementia   Chronic diastolic congestive heart failure (HCC)   Controlled type 2 diabetes mellitus with diabetic nephropathy, without long-term current use of insulin (HCC)   Thrombocytopenia (HCC)   Encephalopathy   Herpes zoster   Pressure ulcer   Dialysis patient [facility])   Complex care coordination   Palliative care by specialist   Advance care planning    Discharge Instructions  Discharge Instructions    Diet - low sodium heart healthy    Complete by:  As directed   Discharge instructions    Complete by:  As directed   Please follow up with hospice service   Increase activity slowly    Complete by:  As directed       Medication List    STOP taking these medications   glucose blood test strip Commonly known as:  ACCU-CHEK AVIVA PLUS     TAKE these medications   amLODipine 5 MG tablet Commonly known as:  NORVASC Take 1 tablet (5 mg total) by mouth at bedtime.   calcitRIOL 0.5 MCG capsule Commonly known as:  ROCALTROL Take 2 capsules (1 mcg total) by mouth every  Monday, Wednesday, and Friday with hemodialysis.   calcium acetate 667 MG capsule Commonly known as:  PHOSLO Take 3 capsules by mouth three times a day   colchicine 0.6 MG tablet Take 0.6 mg by mouth daily as needed. gout   divalproex 500 MG 24 hr tablet Commonly known as:  DEPAKOTE ER Take 1 tablet (500 mg  total) by mouth daily.   feeding supplement (NEPRO CARB STEADY) Liqd Take 237 mLs by mouth 2 (two) times daily between meals.   fexofenadine 180 MG tablet Commonly known as:  ALLEGRA Take 1 tablet (180 mg total) by mouth daily as needed for allergies or rhinitis.   folic acid 1 MG tablet Commonly known as:  FOLVITE Take 1 tablet (1 mg total) by mouth daily.   gabapentin 100 MG capsule Commonly known as:  NEURONTIN Take 1 capsule (100 mg total) by mouth 3 (three) times daily.   HUMALOG KWIKPEN 100 UNIT/ML KiwkPen Generic drug:  insulin lispro Inject 0-5 Units into the skin 3 (three) times daily with meals as needed. Per sliding scale   Insulin Pen Needle 31G X 8 MM Misc Use as directed with insulin pens   Lacosamide 150 MG Tabs Commonly known as:  VIMPAT Take 1 tablet (150 mg total) by mouth 2 (two) times daily.   multivitamin Tabs tablet Take 1 tablet by mouth at bedtime.   pantoprazole 40 MG tablet Commonly known as:  PROTONIX Take 1 tablet (40 mg total) by mouth 2 (two) times daily.   sennosides-docusate sodium 8.6-50 MG tablet Commonly known as:  SENOKOT-S Take 1 tablet by mouth every evening.      Follow-up Information    [name], [name], DO Follow up in 1 week(s).   Specialty:  Geriatric Medicine Contact information: [address] [place] [address] [phone]          Allergies  Allergen Reactions  . Aspirin Other (See Comments)    Bleeding   . Lactose Intolerance (Gi) Diarrhea    Consultations:  nephrology   Procedures/Studies: Dg Chest 1 View  Result Date: 03/16/2016 CLINICAL DATA:  Altered mental status, possible seizure. History of end-stage renal disease on dialysis. EXAM: CHEST 1 VIEW COMPARISON:  Chest radiograph March 04, 2016 FINDINGS: Cardiomediastinal silhouette is unremarkable for this low inspiratory portable examination with crowded vasculature markings. Dual lead RIGHT cardiac pacemaker in situ. Tunneled dialysis catheter via  RIGHT internal jugular venous approach with distal tip projecting in proximal RIGHT atrium. The lungs are clear without pleural effusions or focal consolidations. Trachea projects midline and there is no pneumothorax. Included soft tissue planes and osseous structures are non-suspicious. IMPRESSION: No acute cardiopulmonary process.  No change in life-support lines. Electronically Signed   By: Awilda Metro M.D.   On: 03/16/2016 01:51   Ct Head Wo Contrast  Result Date: 04/06/2016 CLINICAL DATA:  Increasing weakness over the past 2 days with confusion. Headache. EXAM: CT HEAD WITHOUT CONTRAST TECHNIQUE: Contiguous axial images were obtained from the base of the skull through the vertex without intravenous contrast. COMPARISON:  03/19/2016 FINDINGS: The ventricles and cisterns are within normal. There is mild age related atrophic change. There is moderate chronic ischemic microvascular disease. Bilateral basal ganglia calcifications are present. There is no mass, mass effect, shift of midline structures or acute hemorrhage. There is no evidence to suggest acute infarction. Minimal mucosal membrane thickening over the floor the right maxillary sinus. Remaining bony structures are within normal. IMPRESSION: No acute intracranial findings per Mild age related  atrophy and moderate chronic ischemic microvascular disease. Subtle chronic sinus inflammatory change. Electronically Signed   By: Elberta Fortis M.D.   On: 04/06/2016 15:34   Ct Head Wo Contrast  Result Date: 03/19/2016 CLINICAL DATA:  Altered mental status EXAM: CT HEAD WITHOUT CONTRAST TECHNIQUE: Contiguous axial images were obtained from the base of the skull through the vertex without intravenous contrast. COMPARISON:  03/16/2016 FINDINGS: The examination is somewhat limited by patient motion artifact. The bony calvarium is intact. Atrophic changes are noted. Chronic white matter ischemic change is again seen. Bilateral basal ganglia calcifications  are noted. No findings to suggest acute hemorrhage, acute infarction or space-occupying mass lesion are noted. IMPRESSION: Chronic atrophic and ischemic changes without acute intracranial abnormality. Electronically Signed   By: Alcide Clever M.D.   On: 03/19/2016 14:15   Ct Head Wo Contrast  Result Date: 03/16/2016 CLINICAL DATA:  Altered mental status with possible seizures. EXAM: CT HEAD WITHOUT CONTRAST TECHNIQUE: Contiguous axial images were obtained from the base of the skull through the vertex without intravenous contrast. COMPARISON:  03/04/2016 FINDINGS: Diffuse cerebral atrophy. Mild ventricular dilatation consistent with central atrophy. Low-attenuation changes in the deep white matter consistent with small vessel ischemia. No mass effect or midline shift. No abnormal extra-axial fluid collections. Gray-white matter junctions are distinct. Basal cisterns are not effaced. No evidence of acute intracranial hemorrhage. No depressed skull fractures. Mucosal thickening in the paranasal sinuses. Mastoid air cells are not opacified. Vascular calcifications. IMPRESSION: No acute intracranial abnormalities. Chronic atrophy and small vessel ischemic changes. Electronically Signed   By: Burman Nieves M.D.   On: 03/16/2016 02:30   Dg Chest Portable 1 View  Result Date: 04/06/2016 CLINICAL DATA:  Altered mental status/confusion. End-stage renal disease EXAM: PORTABLE CHEST 1 VIEW COMPARISON:  March 19, 2016 FINDINGS: Lungs are clear. Heart size and pulmonary vascularity are normal. No adenopathy. There is atherosclerotic calcification in the aortic arch. Pacemaker leads are attached to the right atrium and right ventricle. Central catheter tip is in the superior vena cava. No pneumothorax. IMPRESSION: Lungs clear. Aortic atherosclerosis. Central catheter tip in superior vena cava. No pneumothorax. Electronically Signed   By: Bretta Bang III M.D.   On: 04/06/2016 13:25   Dg Chest Port 1 View  Result  Date: 03/19/2016 CLINICAL DATA:  Atelectasis,hx htn,dm,esrd EXAM: PORTABLE CHEST 1 VIEW COMPARISON:  03/18/2016 FINDINGS: Mild improvement in the left basilar atelectasis. Lungs otherwise clear. No pneumothorax. Heart and mediastinum are unremarkable. Right internal jugular dual-lumen central venous catheter and right-sided sequential pacemaker are stable and well positioned. IMPRESSION: 1. No acute findings.  Improved left lung base atelectasis. Electronically Signed   By: Amie Portland M.D.   On: 03/19/2016 08:02   Dg Chest Port 1 View  Result Date: 03/18/2016 CLINICAL DATA:  Fever and altered mental status tonight. EXAM: PORTABLE CHEST 1 VIEW COMPARISON:  03/16/2016 FINDINGS: Cardiac pacemaker. Right central venous catheter with tip over the low SVC region. No pneumothorax. Shallow inspiration with atelectasis in the left lung base. No focal consolidation. No blunting of costophrenic angles. Normal heart size and pulmonary vascularity. Calcification of the aorta. Degenerative changes in the shoulders. IMPRESSION: Appliances appear in satisfactory location. Shallow inspiration with atelectasis in the left lung base. Electronically Signed   By: Burman Nieves M.D.   On: 03/18/2016 03:38       Subjective: Patient is somnolent with nonoperative pain or dyspnea.  Discharge Exam: Vitals:   04/09/16 0701 04/09/16 1015  BP: (!) 123/59 Marland Kitchen)  124/48  Pulse: 77 76  Resp: 18 18  Temp: 97.5 F (36.4 C) 98 F (36.7 C)   Vitals:   04/08/16 1900 04/08/16 2012 04/09/16 0701 04/09/16 1015  BP:  (!) 103/55 (!) 123/59 (!) 124/48  Pulse:  78 77 76  Resp:  Temp: (!) 100.7 F (38.2 C) 99.1 F (37.3 C) 97.5 F (36.4 C) 98 F (36.7 C)  TempSrc: Oral Oral Oral Oral  SpO2:  97% 98% 99%  Weight:        General: Pt is somnolent, ill looking appearing and deconditioned Cardiovascular: RRR, S1/S2 +, no rubs, no gallops Respiratory: CTA bilaterally, no wheezing, no rhonchi Abdominal: Soft, NT,  ND, bowel sounds + Extremities: no edema, no cyanosis     The results of significant diagnostics from this hospitalization (including imaging, microbiology, ancillary and laboratory) are listed below for reference.     Microbiology: Recent Results (from the past 240 hour(s))  Blood culture (routine x 2)     Status: None (Preliminary result)   Collection Time: 04/06/16  2:25 PM  Result Value Ref Range Status   Specimen Description BLOOD RIGHT HAND  Final   Special Requests IN PEDIATRIC BOTTLE  1CC  Final   Culture NO GROWTH 3 DAYS  Final   Report Status PENDING  Incomplete  Blood culture (routine x 2)     Status: None (Preliminary result)   Collection Time: 04/06/16  2:35 PM  Result Value Ref Range Status   Specimen Description BLOOD RIGHT ARM  Final   Special Requests IN PEDIATRIC BOTTLE  1CC  Final   Culture NO GROWTH 3 DAYS  Final   Report Status PENDING  Incomplete  Urine culture     Status: None   Collection Time: 04/06/16  3:50 PM  Result Value Ref Range Status   Specimen Description URINE, CATHETERIZED  Final   Special Requests NONE  Final   Culture NO GROWTH  Final   Report Status 04/08/2016 FINAL  Final  MRSA PCR Screening     Status: None   Collection Time: 04/08/16  6:15 AM  Result Value Ref Range Status   MRSA by PCR NEGATIVE NEGATIVE Final    Comment:        The GeneXpert MRSA Assay (FDA approved for NASAL specimens only), is one component of a comprehensive MRSA colonization surveillance program. It is not intended to diagnose MRSA infection nor to guide or monitor treatment for MRSA infections.      Labs: BNP (last 3 results) No results for input(s): BNP in the last 8760 hours. Basic Metabolic Panel:  Recent Labs Lab 04/06/16 1439 04/07/16 0619 04/08/16 0807  NA 137 137 134*  K 4.5 4.3 4.0  CL 104 102 97*  CO2 GLUCOSE 158* 166* 203*  BUN 36* 43* 27*  CREATININE 6.80* 7.72* 6.35*  CALCIUM 9.5 9.4 9.2  PHOS  --  4.1  --     Liver Function Tests:  Recent Labs Lab 04/06/16 1439 04/07/16 0619  AST 41  --   ALT 26  --   ALKPHOS 56  --   BILITOT 0.6  --   PROT 6.7  --   ALBUMIN 3.1* 2.8*   No results for input(s): LIPASE, AMYLASE in the last 168 hours.  Recent Labs Lab 04/06/16 1440  AMMONIA 33   CBC:  Recent Labs Lab 04/06/16 1611 04/08/16 0807  WBC 4.0 3.7*  NEUTROABS 2.0 2.3  HGB 10.8*  10.6*  HCT 33.5* 33.5*  MCV 100.9* 100.6*  PLT 56* 58*   Cardiac Enzymes: No results for input(s): CKTOTAL, CKMB, CKMBINDEX, TROPONINI in the last 168 hours. BNP: Invalid input(s): POCBNP CBG:  Recent Labs Lab 04/08/16 0741 04/08/16 1150 04/08/16 1628 04/08/16 2008 04/09/16 0804  GLUCAP 198* 201* 195* 167* 180*   D-Dimer No results for input(s): DDIMER in the last 72 hours. Hgb A1c No results for input(s): HGBA1C in the last 72 hours. Lipid Profile No results for input(s): CHOL, HDL, LDLCALC, TRIG, CHOLHDL, LDLDIRECT in the last 72 hours. Thyroid function studies No results for input(s): TSH, T4TOTAL, T3FREE, THYROIDAB in the last 72 hours.  Invalid input(s): FREET3 Anemia work up No results for input(s): VITAMINB12, FOLATE, FERRITIN, TIBC, IRON, RETICCTPCT in the last 72 hours. Urinalysis    Component Value Date/Time   COLORURINE YELLOW 04/06/2016 1550   APPEARANCEUR CLEAR 04/06/2016 1550   LABSPEC 1.013 04/06/2016 1550   PHURINE 8.0 04/06/2016 1550   GLUCOSEU 100 (A) 04/06/2016 1550   HGBUR TRACE (A) 04/06/2016 1550   BILIRUBINUR NEGATIVE 04/06/2016 1550   KETONESUR NEGATIVE 04/06/2016 1550   PROTEINUR 100 (A) 04/06/2016 1550   UROBILINOGEN 0.2 06/20/2015 0845   NITRITE NEGATIVE 04/06/2016 1550   LEUKOCYTESUR TRACE (A) 04/06/2016 1550   Sepsis Labs Invalid input(s): PROCALCITONIN,  WBC,  LACTICIDVEN Microbiology Recent Results (from the past 240 hour(s))  Blood culture (routine x 2)     Status: None (Preliminary result)   Collection Time: 04/06/16  2:25 PM  Result Value  Ref Range Status   Specimen Description BLOOD RIGHT HAND  Final   Special Requests IN PEDIATRIC BOTTLE  1CC  Final   Culture NO GROWTH 3 DAYS  Final   Report Status PENDING  Incomplete  Blood culture (routine x 2)     Status: None (Preliminary result)   Collection Time: 04/06/16  2:35 PM  Result Value Ref Range Status   Specimen Description BLOOD RIGHT ARM  Final   Special Requests IN PEDIATRIC BOTTLE  1CC  Final   Culture NO GROWTH 3 DAYS  Final   Report Status PENDING  Incomplete  Urine culture     Status: None   Collection Time: 04/06/16  3:50 PM  Result Value Ref Range Status   Specimen Description URINE, CATHETERIZED  Final   Special Requests NONE  Final   Culture NO GROWTH  Final   Report Status 04/08/2016 FINAL  Final  MRSA PCR Screening     Status: None   Collection Time: 04/08/16  6:15 AM  Result Value Ref Range Status   MRSA by PCR NEGATIVE NEGATIVE Final    Comment:        The GeneXpert MRSA Assay (FDA approved for NASAL specimens only), is one component of a comprehensive MRSA colonization surveillance program. It is not intended to diagnose MRSA infection nor to guide or monitor treatment for MRSA infections.      Time coordinating discharge:   SIGNED:   Coralie Keens, MD  [facility] 04/09/2016, 3:44 PM Pager   If 7PM-7AM, please contact night-coverage [email or web address] Password [email or web address]

## 2016-04-09 NOTE — Progress Notes (Signed)
[facility] Liaison  Continue to follow for family interest in [facility]. Unfortunately [facility] is not able to offer room today. CSW Maralyn Sago is aware. Will continue to follow and update CSW if availability changes.   Thank you,  Forrestine Him, LCSW  [phone]

## 2016-04-09 NOTE — Clinical Social Work Note (Signed)
Patient discharged to [facility] today. Family at the bedside and completed admissions paperwork for hospice facility with Nelson Chimes, nurse liaison for hospice at the hospital.  Patient was transported to hospice facility by ambulance.  Genelle Bal, MSW, LCSW Licensed Clinical Social Worker Clinical Social Work Department [employer] [phone]

## 2016-04-11 LAB — CULTURE, BLOOD (ROUTINE X 2)
CULTURE: NO GROWTH
CULTURE: NO GROWTH

## 2016-04-15 ENCOUNTER — Ambulatory Visit: Payer: Medicare Other | Admitting: Diagnostic Neuroimaging

## 2016-04-21 ENCOUNTER — Ambulatory Visit: Payer: Self-pay | Admitting: *Deleted

## 2016-04-22 ENCOUNTER — Ambulatory Visit: Payer: Medicare Other | Admitting: Vascular Surgery

## 2016-04-23 ENCOUNTER — Telehealth: Payer: Self-pay | Admitting: Internal Medicine

## 2016-04-23 NOTE — Telephone Encounter (Signed)
Received patient's FMLA paperwork for son, Kelijah Towry #[phone] or [phone] to be completed due to taking father to and from all Dr. Algie Coffer and to and from dialysis 3 times a week. [employer] all caretaking of patient.   Employer: [employer] #[phone] Fax: [phone]  Given to Dr. Renato Gails

## 2016-04-26 NOTE — Telephone Encounter (Signed)
Paperwork completed and wife notified that forms were ready for pick up, left up front. Faxed a copy to [employer] to [employer].

## 2016-04-26 NOTE — Telephone Encounter (Signed)
Sent copy for scanning.

## 2016-05-07 DEATH — deceased

## 2016-06-03 ENCOUNTER — Ambulatory Visit: Payer: Medicare Other | Admitting: Internal Medicine

## 2017-06-30 IMAGING — MR MR HEAD W/O CM
9 of 10 series · 36 of 48 positions shown · non-contrast
Comparison: CT head 06/20/2015, MRI 05/21/2015

CLINICAL DATA: Altered mental status.  History of subdural hematoma

EXAM:
MRI HEAD WITHOUT CONTRAST
TECHNIQUE: Multiplanar, multiecho pulse sequences of the brain and surrounding
structures were obtained without intravenous contrast.

[Series 3: DWI · axial · 3.0mm · 1.09mm/px · z∈[-47,+103]mm · 8 of 102 slices shown (1 of 4)]
[im 1/102]
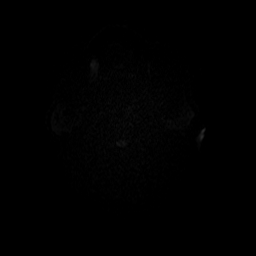
[im 15/102]
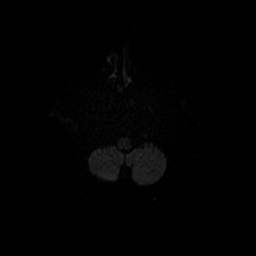
[im 29/102]
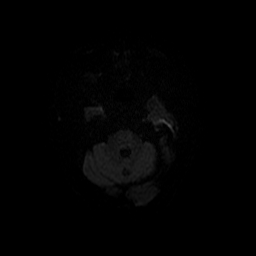
[im 44/102]
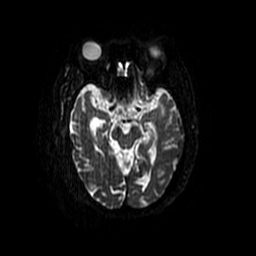
[im 58/102]
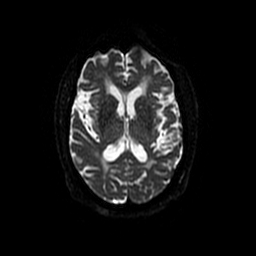
[im 73/102]
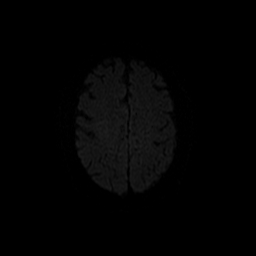
[im 87/102]
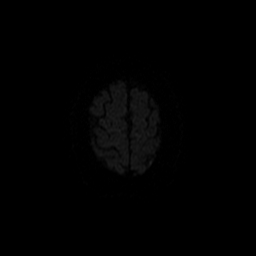
[im 102/102]
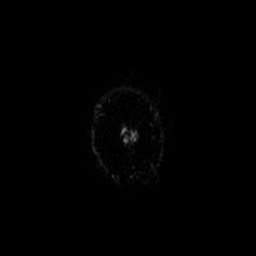

[Series 4: T1 · sagittal · 5.0mm · 0.47mm/px · 2 of 23 slices shown]
[im 1/23]
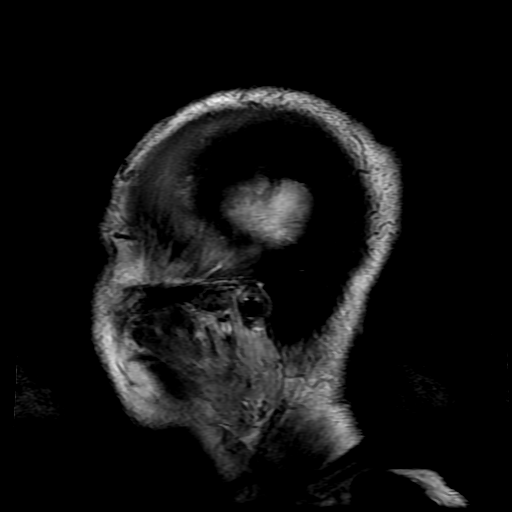
[im 23/23]
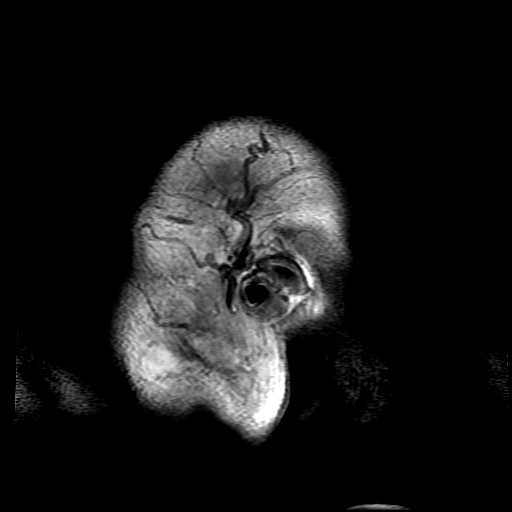

[Series 5: DWI · coronal · 5.0mm · 1.09mm/px · 7 of 76 slices shown (2 of 4)]
[im 1/76]
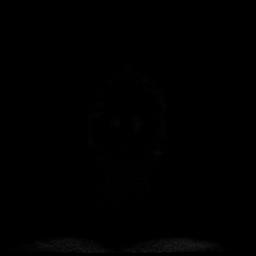
[im 13/76]
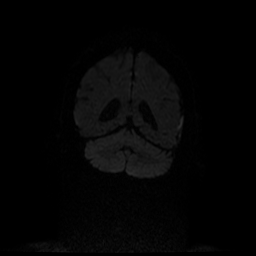
[im 26/76]
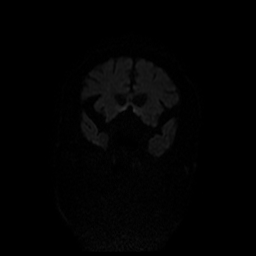
[im 38/76]
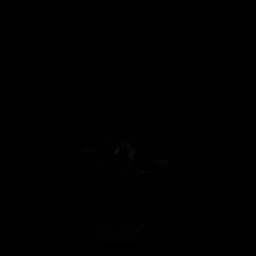
[im 51/76]
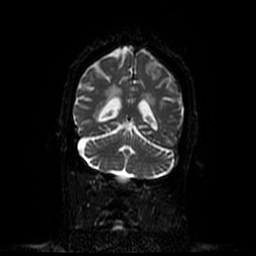
[im 63/76]
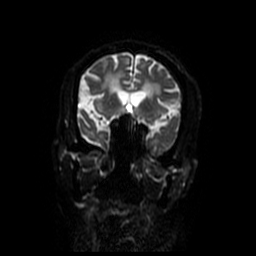
[im 76/76]
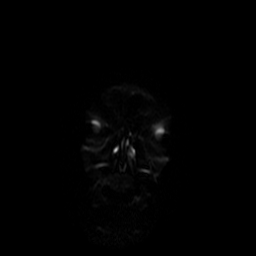

[Series 6: ax mpgr · axial · 5.0mm · 0.43mm/px · 1 of 28 slices shown]
[im 1/28]
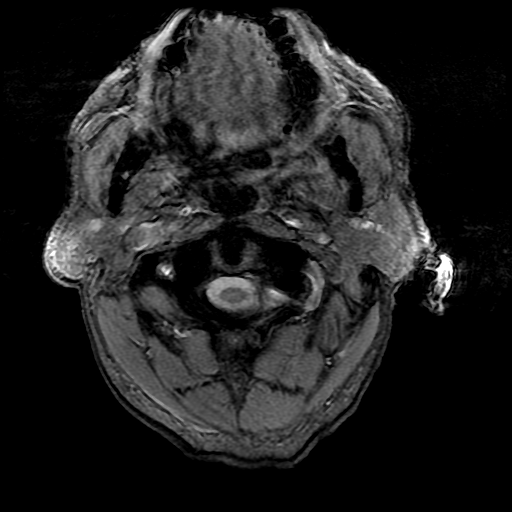

[Series 8: T2 · axial · 5.0mm · 0.43mm/px · z∈[-66,+96]mm · 3 of 28 slices shown (1 of 2)]
[im 1/28]
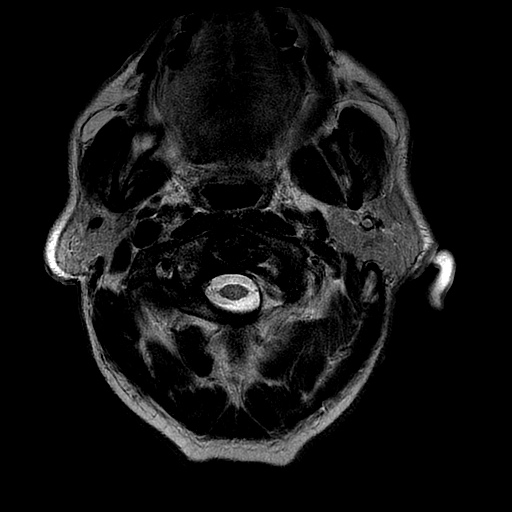
[im 14/28]
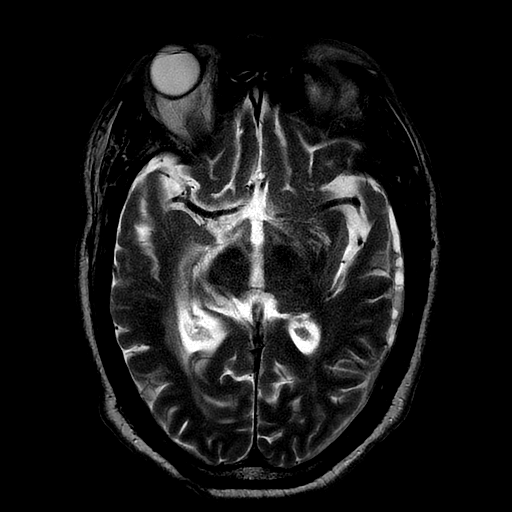
[im 28/28]
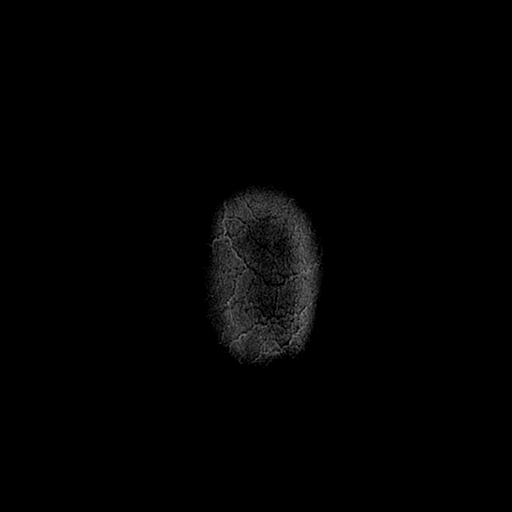

[Series 9: FLAIR · axial · 5.0mm · 0.43mm/px · z∈[-66,+96]mm · 3 of 28 slices shown]
[im 1/28]
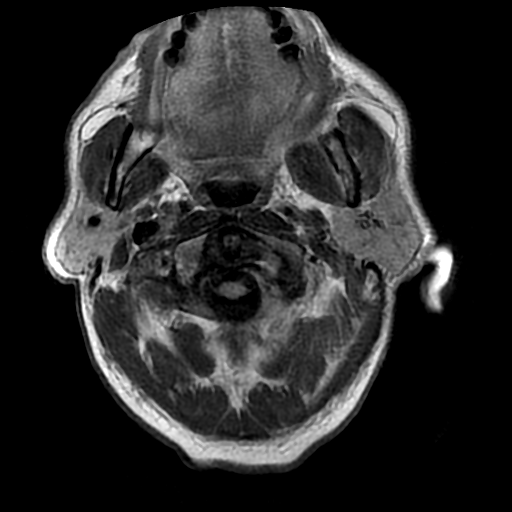
[im 14/28]
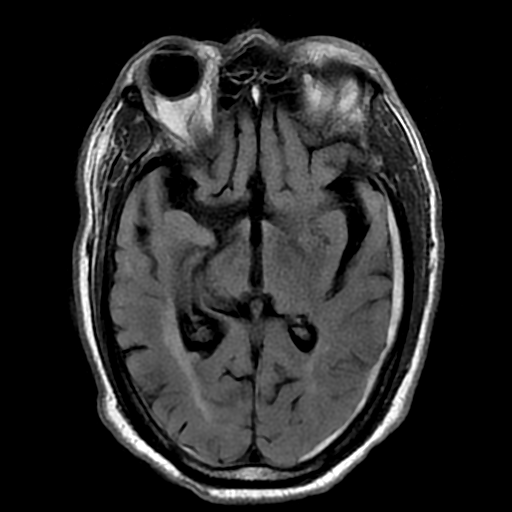
[im 28/28]
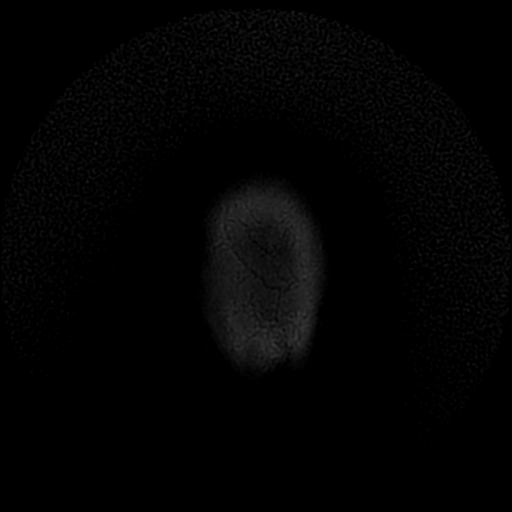

[Series 10: T2 · coronal · 5.0mm · 0.43mm/px · 3 of 32 slices shown (2 of 2)]
[im 1/32]
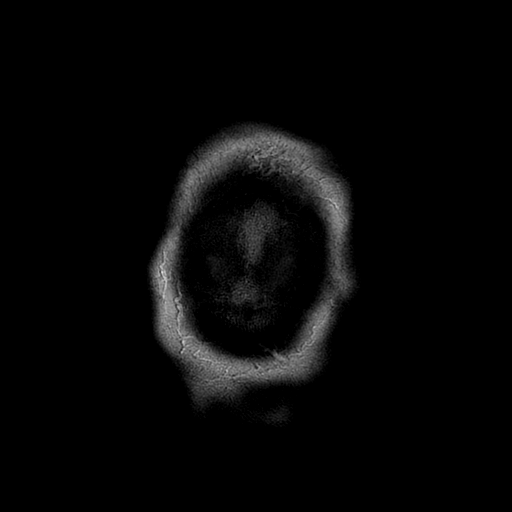
[im 16/32]
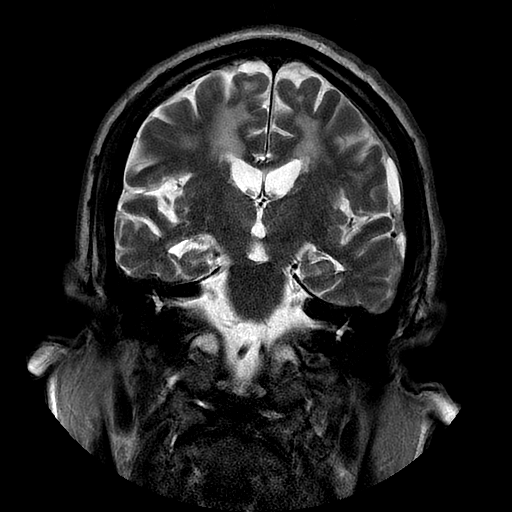
[im 32/32]
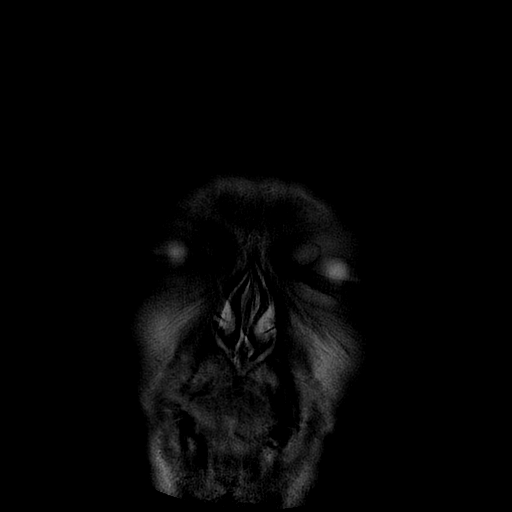

[Series 300: DWI · axial · 3.0mm · 1.09mm/px · z∈[-47,+103]mm · 5 of 51 slices shown (3 of 4)]
[im 1/51]
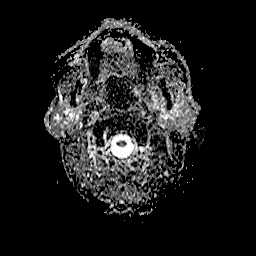
[im 13/51]
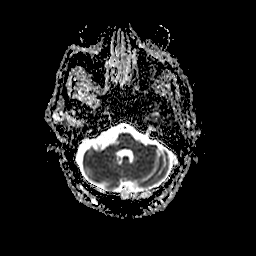
[im 26/51]
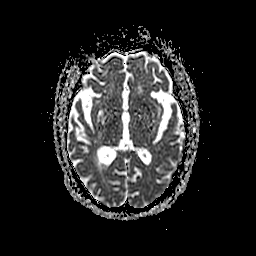
[im 38/51]
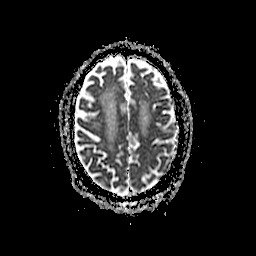
[im 51/51]
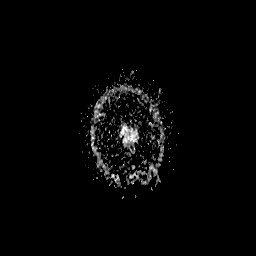

[Series 500: DWI · coronal · 5.0mm · 1.09mm/px · 4 of 38 slices shown (4 of 4)]
[im 1/38]
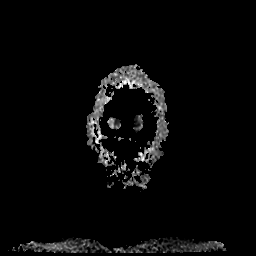
[im 13/38]
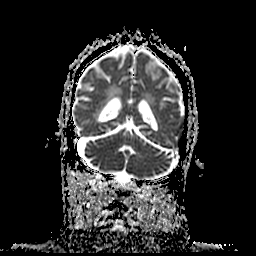
[im 25/38]
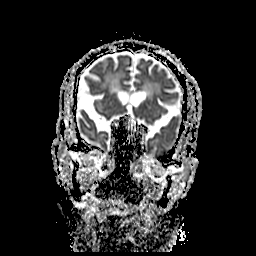
[im 38/38]
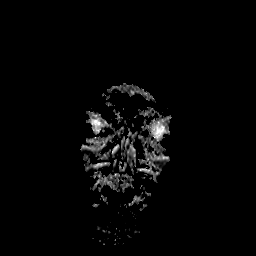

[36 of 48 positions shown; findings below may reference images not displayed]

FINDINGS: Left hemisphere subdural hematoma slightly smaller now measuring 5
mm in thickness. Typical evolutionary changes with methemoglobin
present. Right-sided subdural collection has resolved. No new area
of hemorrhage.

Generalized atrophy. Negative for hydrocephalus. No significant
shift of the midline structures.

Negative for acute infarct. Chronic microvascular ischemic change
throughout the white matter of a moderate degree. Brainstem intact.
Basal ganglia intact.

Scattered areas of chronic micro hemorrhage in the left
temporoparietal lobe and bilateral occipital lobe. This is most
likely related to chronic hypertension.
IMPRESSION: Negative for acute infarct. Moderate to advanced chronic
microvascular ischemia. Scattered areas of chronic micro hemorrhage
bilaterally most consistent with chronic hypertension

Subacute to chronic left-sided subdural hematoma slightly smaller
now measuring 5 mm. Right-sided subdural collection has resolved in
the interval. No new area of hemorrhage.
